# Patient Record
Sex: Female | Born: 1985 | Race: White | Hispanic: No | Marital: Married | Smoking: Never smoker
Health system: Southern US, Community
[De-identification: ages and names within clinical notes are randomized; demographics above are authoritative.]

## PROBLEM LIST (undated history)

## (undated) DIAGNOSIS — N189 Chronic kidney disease, unspecified: Secondary | ICD-10-CM

## (undated) DIAGNOSIS — Z87442 Personal history of urinary calculi: Secondary | ICD-10-CM

## (undated) DIAGNOSIS — K219 Gastro-esophageal reflux disease without esophagitis: Secondary | ICD-10-CM

## (undated) DIAGNOSIS — T7840XA Allergy, unspecified, initial encounter: Secondary | ICD-10-CM

## (undated) DIAGNOSIS — F329 Major depressive disorder, single episode, unspecified: Secondary | ICD-10-CM

## (undated) DIAGNOSIS — E162 Hypoglycemia, unspecified: Secondary | ICD-10-CM

## (undated) DIAGNOSIS — K760 Fatty (change of) liver, not elsewhere classified: Secondary | ICD-10-CM

## (undated) DIAGNOSIS — R12 Heartburn: Secondary | ICD-10-CM

## (undated) DIAGNOSIS — K5900 Constipation, unspecified: Secondary | ICD-10-CM

## (undated) DIAGNOSIS — M7989 Other specified soft tissue disorders: Secondary | ICD-10-CM

## (undated) DIAGNOSIS — E559 Vitamin D deficiency, unspecified: Secondary | ICD-10-CM

## (undated) DIAGNOSIS — M797 Fibromyalgia: Secondary | ICD-10-CM

## (undated) DIAGNOSIS — D649 Anemia, unspecified: Secondary | ICD-10-CM

## (undated) DIAGNOSIS — N200 Calculus of kidney: Secondary | ICD-10-CM

## (undated) DIAGNOSIS — Z9889 Other specified postprocedural states: Secondary | ICD-10-CM

## (undated) DIAGNOSIS — R002 Palpitations: Secondary | ICD-10-CM

## (undated) DIAGNOSIS — R0602 Shortness of breath: Secondary | ICD-10-CM

## (undated) DIAGNOSIS — I10 Essential (primary) hypertension: Secondary | ICD-10-CM

## (undated) DIAGNOSIS — F410 Panic disorder [episodic paroxysmal anxiety] without agoraphobia: Secondary | ICD-10-CM

## (undated) DIAGNOSIS — E538 Deficiency of other specified B group vitamins: Secondary | ICD-10-CM

## (undated) DIAGNOSIS — J189 Pneumonia, unspecified organism: Secondary | ICD-10-CM

## (undated) DIAGNOSIS — E7800 Pure hypercholesterolemia, unspecified: Secondary | ICD-10-CM

## (undated) DIAGNOSIS — E739 Lactose intolerance, unspecified: Secondary | ICD-10-CM

## (undated) DIAGNOSIS — K829 Disease of gallbladder, unspecified: Secondary | ICD-10-CM

## (undated) DIAGNOSIS — N979 Female infertility, unspecified: Secondary | ICD-10-CM

## (undated) DIAGNOSIS — G43909 Migraine, unspecified, not intractable, without status migrainosus: Secondary | ICD-10-CM

## (undated) DIAGNOSIS — E282 Polycystic ovarian syndrome: Secondary | ICD-10-CM

## (undated) DIAGNOSIS — R112 Nausea with vomiting, unspecified: Secondary | ICD-10-CM

## (undated) DIAGNOSIS — O139 Gestational [pregnancy-induced] hypertension without significant proteinuria, unspecified trimester: Secondary | ICD-10-CM

## (undated) DIAGNOSIS — F419 Anxiety disorder, unspecified: Secondary | ICD-10-CM

## (undated) DIAGNOSIS — F988 Other specified behavioral and emotional disorders with onset usually occurring in childhood and adolescence: Secondary | ICD-10-CM

## (undated) DIAGNOSIS — Z8711 Personal history of peptic ulcer disease: Secondary | ICD-10-CM

## (undated) DIAGNOSIS — E039 Hypothyroidism, unspecified: Secondary | ICD-10-CM

## (undated) DIAGNOSIS — F3289 Other specified depressive episodes: Secondary | ICD-10-CM

## (undated) DIAGNOSIS — M1990 Unspecified osteoarthritis, unspecified site: Secondary | ICD-10-CM

## (undated) DIAGNOSIS — M549 Dorsalgia, unspecified: Secondary | ICD-10-CM

## (undated) HISTORY — DX: Migraine, unspecified, not intractable, without status migrainosus: G43.909

## (undated) HISTORY — DX: Anxiety disorder, unspecified: F41.9

## (undated) HISTORY — PX: SMALL INTESTINE SURGERY: SHX150

## (undated) HISTORY — PX: LITHOTRIPSY: SUR834

## (undated) HISTORY — DX: Personal history of peptic ulcer disease: Z87.11

## (undated) HISTORY — DX: Deficiency of other specified B group vitamins: E53.8

## (undated) HISTORY — DX: Essential (primary) hypertension: I10

## (undated) HISTORY — DX: Panic disorder (episodic paroxysmal anxiety): F41.0

## (undated) HISTORY — DX: Vitamin D deficiency, unspecified: E55.9

## (undated) HISTORY — PX: TUBAL LIGATION: SHX77

## (undated) HISTORY — DX: Dorsalgia, unspecified: M54.9

## (undated) HISTORY — DX: Fibromyalgia: M79.7

## (undated) HISTORY — PX: FOOT SURGERY: SHX648

## (undated) HISTORY — DX: Polycystic ovarian syndrome: E28.2

## (undated) HISTORY — DX: Shortness of breath: R06.02

## (undated) HISTORY — PX: TONSILLECTOMY: SUR1361

## (undated) HISTORY — DX: Pure hypercholesterolemia, unspecified: E78.00

## (undated) HISTORY — DX: Chronic kidney disease, unspecified: N18.9

## (undated) HISTORY — DX: Major depressive disorder, single episode, unspecified: F32.9

## (undated) HISTORY — DX: Disease of gallbladder, unspecified: K82.9

## (undated) HISTORY — DX: Constipation, unspecified: K59.00

## (undated) HISTORY — DX: Hypothyroidism, unspecified: E03.9

## (undated) HISTORY — DX: Other specified behavioral and emotional disorders with onset usually occurring in childhood and adolescence: F98.8

## (undated) HISTORY — DX: Lactose intolerance, unspecified: E73.9

## (undated) HISTORY — DX: Fatty (change of) liver, not elsewhere classified: K76.0

## (undated) HISTORY — PX: WISDOM TOOTH EXTRACTION: SHX21

## (undated) HISTORY — DX: Palpitations: R00.2

## (undated) HISTORY — DX: Other specified depressive episodes: F32.89

## (undated) HISTORY — DX: Morbid (severe) obesity due to excess calories: E66.01

## (undated) HISTORY — DX: Allergy, unspecified, initial encounter: T78.40XA

## (undated) HISTORY — DX: Heartburn: R12

## (undated) HISTORY — DX: Other specified soft tissue disorders: M79.89

---

## 2006-07-25 ENCOUNTER — Emergency Department (HOSPITAL_COMMUNITY): Admission: EM | Admit: 2006-07-25 | Discharge: 2006-07-25 | Payer: Self-pay | Admitting: Family Medicine

## 2006-09-10 ENCOUNTER — Emergency Department (HOSPITAL_COMMUNITY): Admission: EM | Admit: 2006-09-10 | Discharge: 2006-09-10 | Payer: Self-pay | Admitting: Emergency Medicine

## 2006-09-10 IMAGING — CT CT ABDOMEN W/O CM
2 of 4 series · 13 of 32 positions shown, 18 images · IV contrast (agent unspecified)
Comparison: none

CLINICAL DATA: Right-sided flank and back pain.  Hematuria.  Vomiting.  
 ABDOMEN CT WITHOUT CONTRAST:
TECHNIQUE: Multidetector CT imaging of the abdomen was performed following the standard protocol without IV contrast.
TECHNIQUE: Multidetector CT imaging of the pelvis was performed following the standard protocol without IV contrast.

[Series 2: routine abdomen · axial · 0.91mm/px · z∈[-438,-138]mm · 5 of 91 slices shown, 10 images]
[im 16/91  soft-tissue]
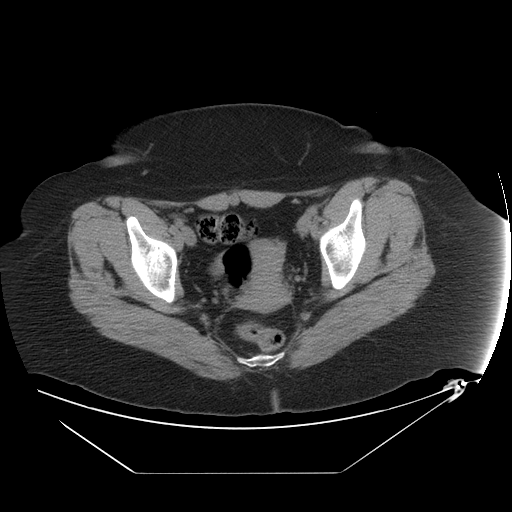
[im 16/91  bone]
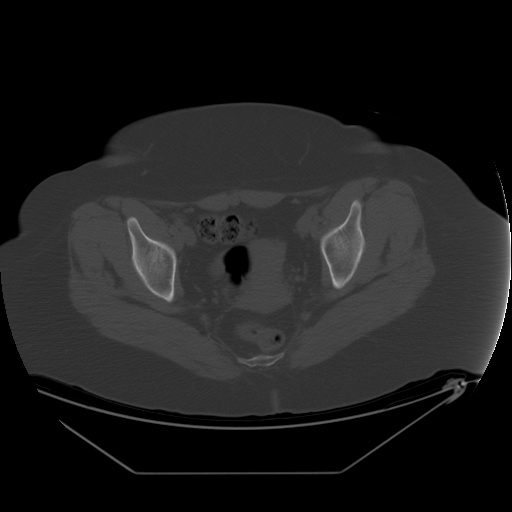
[im 31/91  soft-tissue]
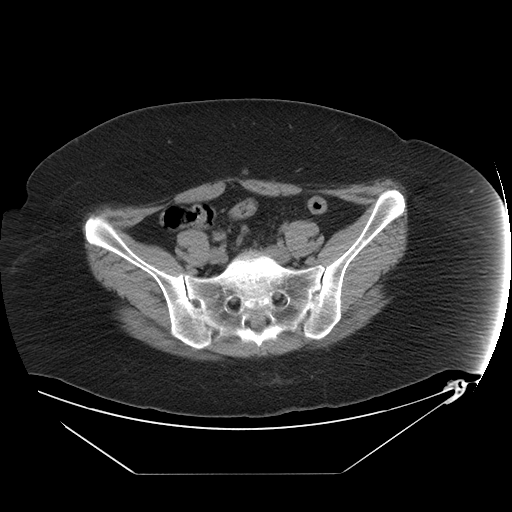
[im 31/91  lung]
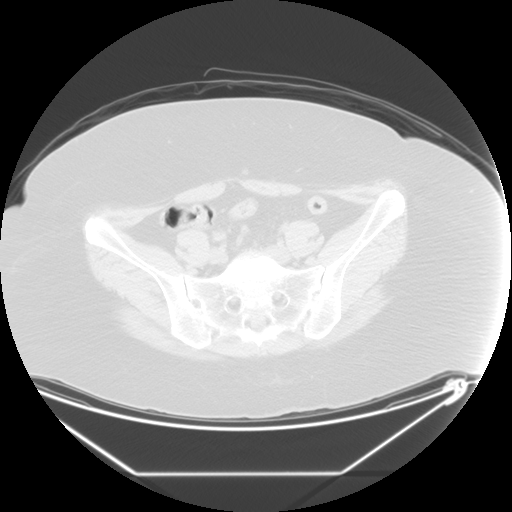
[im 46/91  soft-tissue]
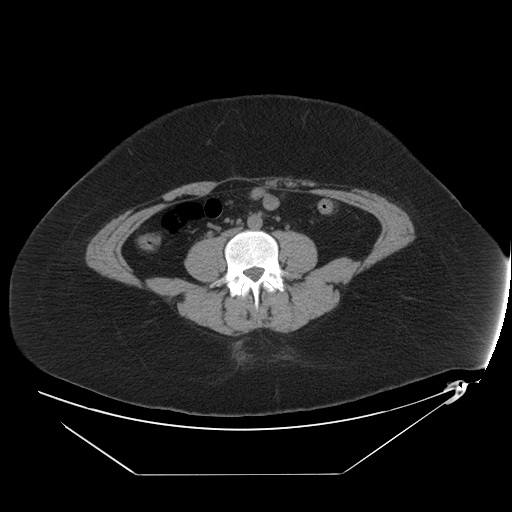
[im 46/91  lung]
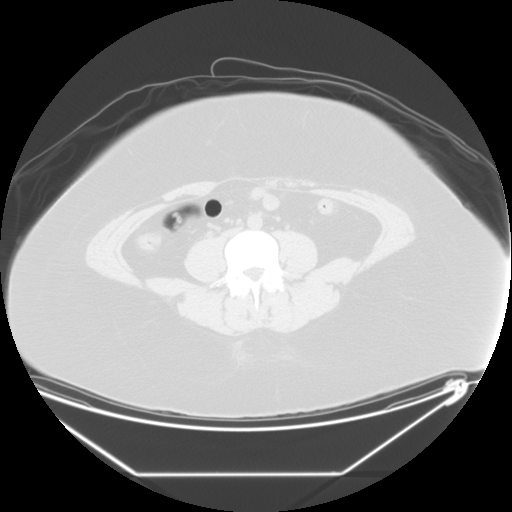
[im 61/91  soft-tissue]
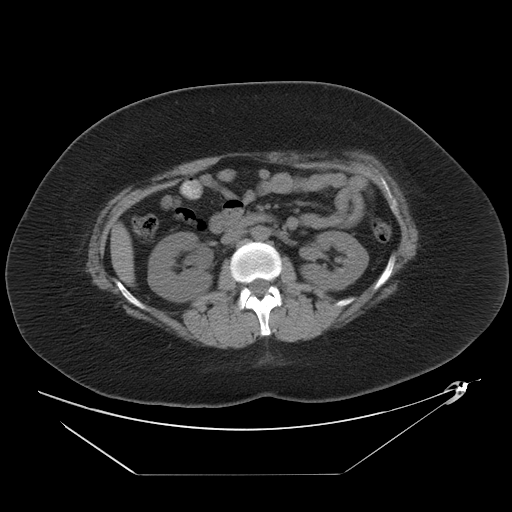
[im 61/91  lung]
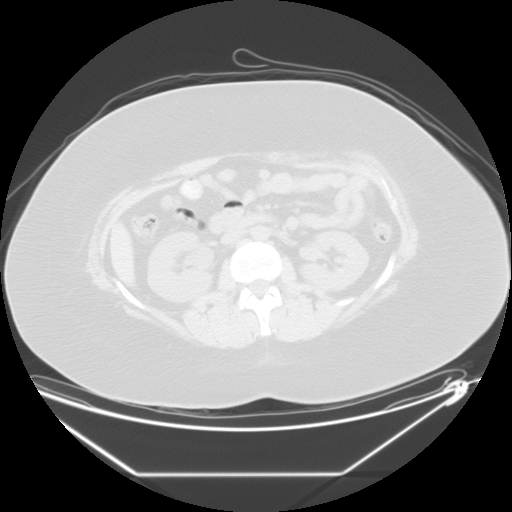
[im 76/91  soft-tissue]
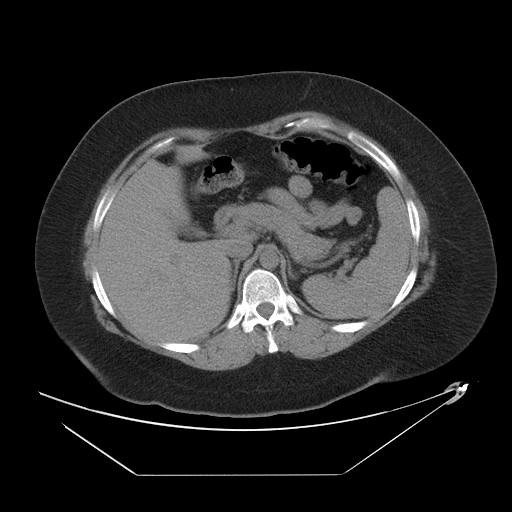
[im 76/91  lung]
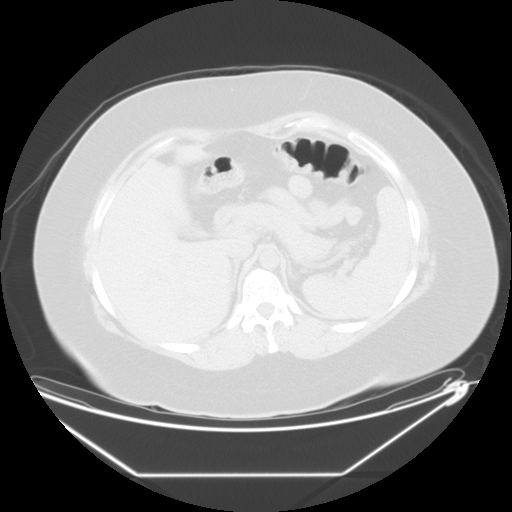

[Series 401: reformatted · sagittal · 0.98mm/px · 8 of 160 slices shown]
[im 13/160  soft-tissue]
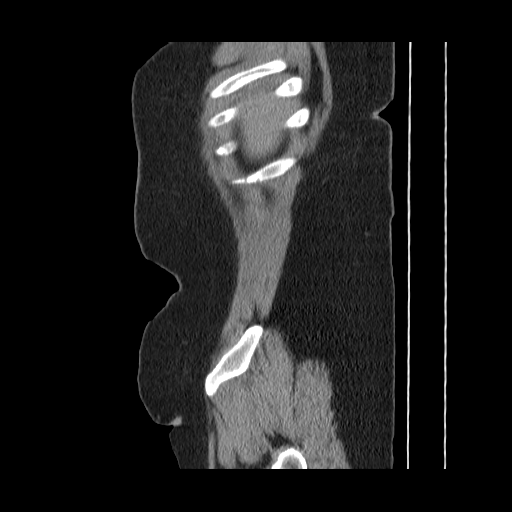
[im 37/160  soft-tissue]
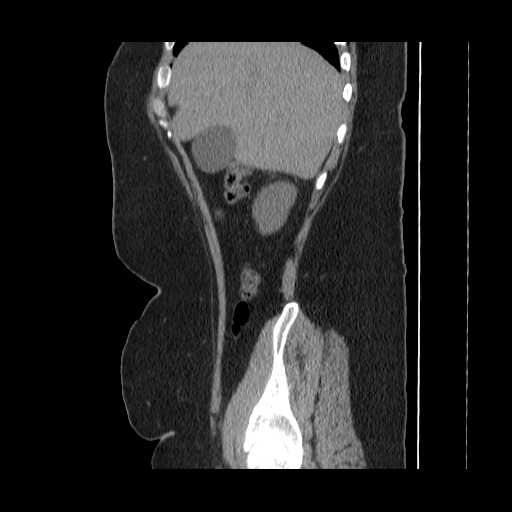
[im 49/160  soft-tissue]
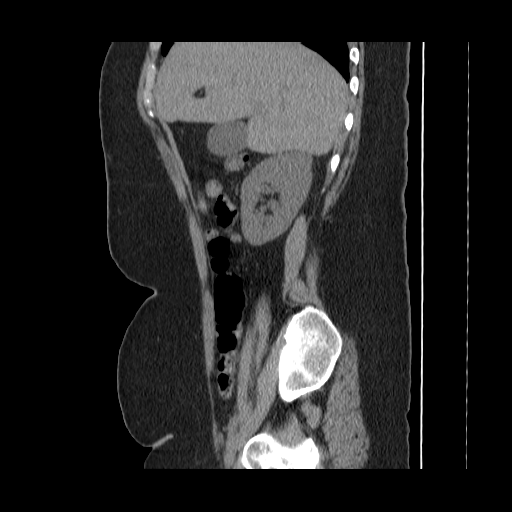
[im 74/160  soft-tissue]
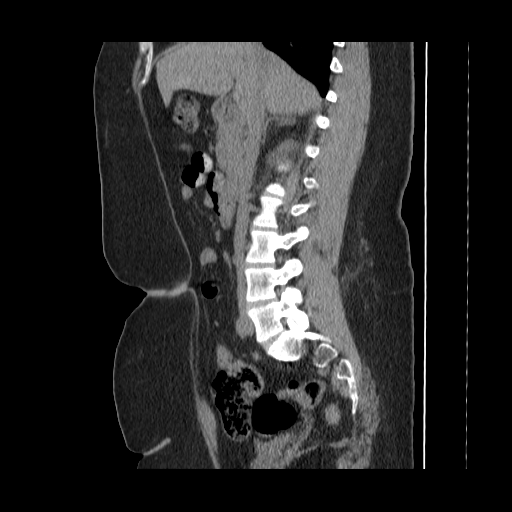
[im 86/160  soft-tissue]
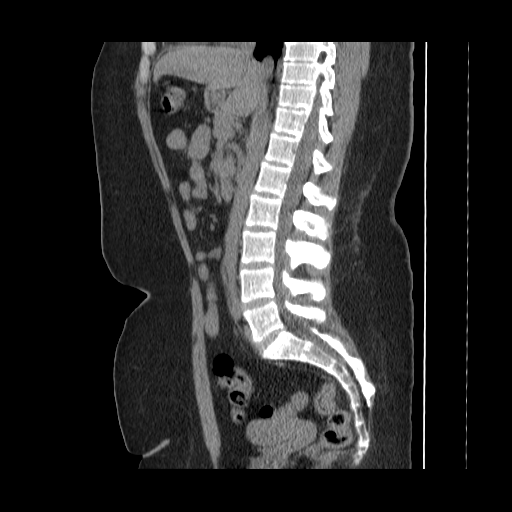
[im 111/160  soft-tissue]
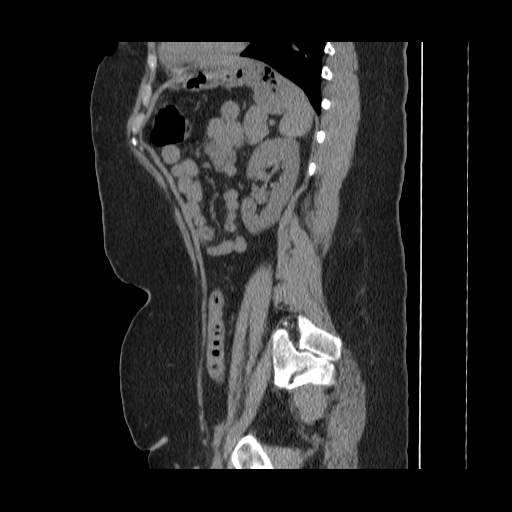
[im 123/160  soft-tissue]
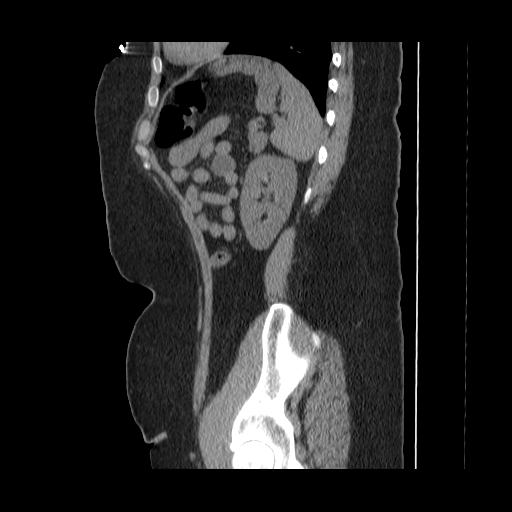
[im 147/160  soft-tissue]
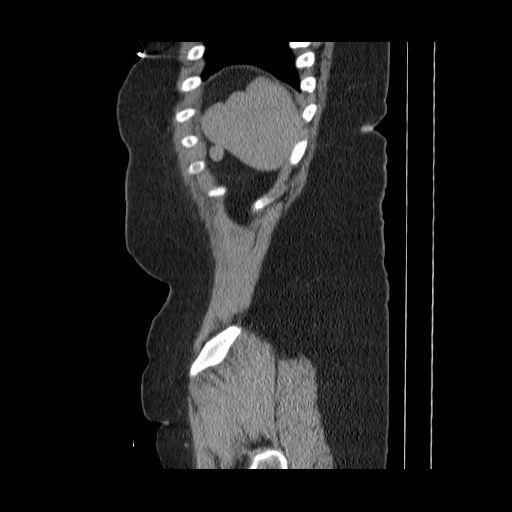

[13 of 32 positions shown; findings below may reference images not displayed]

FINDINGS: Mild right hydronephrosis is seen.  No intrarenal calculi are identified.  Noncontrast images of the other abdominal parenchymal organs are unremarkable.  There is no evidence of mass or inflammatory process.
IMPRESSION: Mild right hydronephrosis.  See pelvic CT report below.  
 PELVIS CT WITHOUT CONTRAST:
FINDINGS: Mild right ureteral dilatation is seen.  A tiny 2 mm calculus is seen in the distal right ureter just proximal to the ureterovesical junction.  
 There is no evidence of pelvic mass or inflammatory process.  No dilated bowel loops are seen and there is no evidence of abnormal fluid collections.
IMPRESSION: There is a 2 mm calculus in the distal right ureter.  This results in mild right hydronephrosis.

## 2007-01-18 ENCOUNTER — Emergency Department (HOSPITAL_COMMUNITY): Admission: EM | Admit: 2007-01-18 | Discharge: 2007-01-18 | Payer: Self-pay | Admitting: Emergency Medicine

## 2007-05-02 ENCOUNTER — Emergency Department (HOSPITAL_COMMUNITY): Admission: EM | Admit: 2007-05-02 | Discharge: 2007-05-03 | Payer: Self-pay | Admitting: Emergency Medicine
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
## 2007-05-10 ENCOUNTER — Emergency Department (HOSPITAL_COMMUNITY): Admission: EM | Admit: 2007-05-10 | Discharge: 2007-05-10 | Payer: Self-pay | Admitting: Emergency Medicine

## 2007-05-10 IMAGING — CR DG WRIST COMPLETE 3+V*R*
4 series · 4 of 4 positions shown · non-contrast
Comparison: none

CLINICAL DATA: Fell today with wrist pain.  
 RIGHT WRIST ? 4 VIEW:

[x wrist pa right]
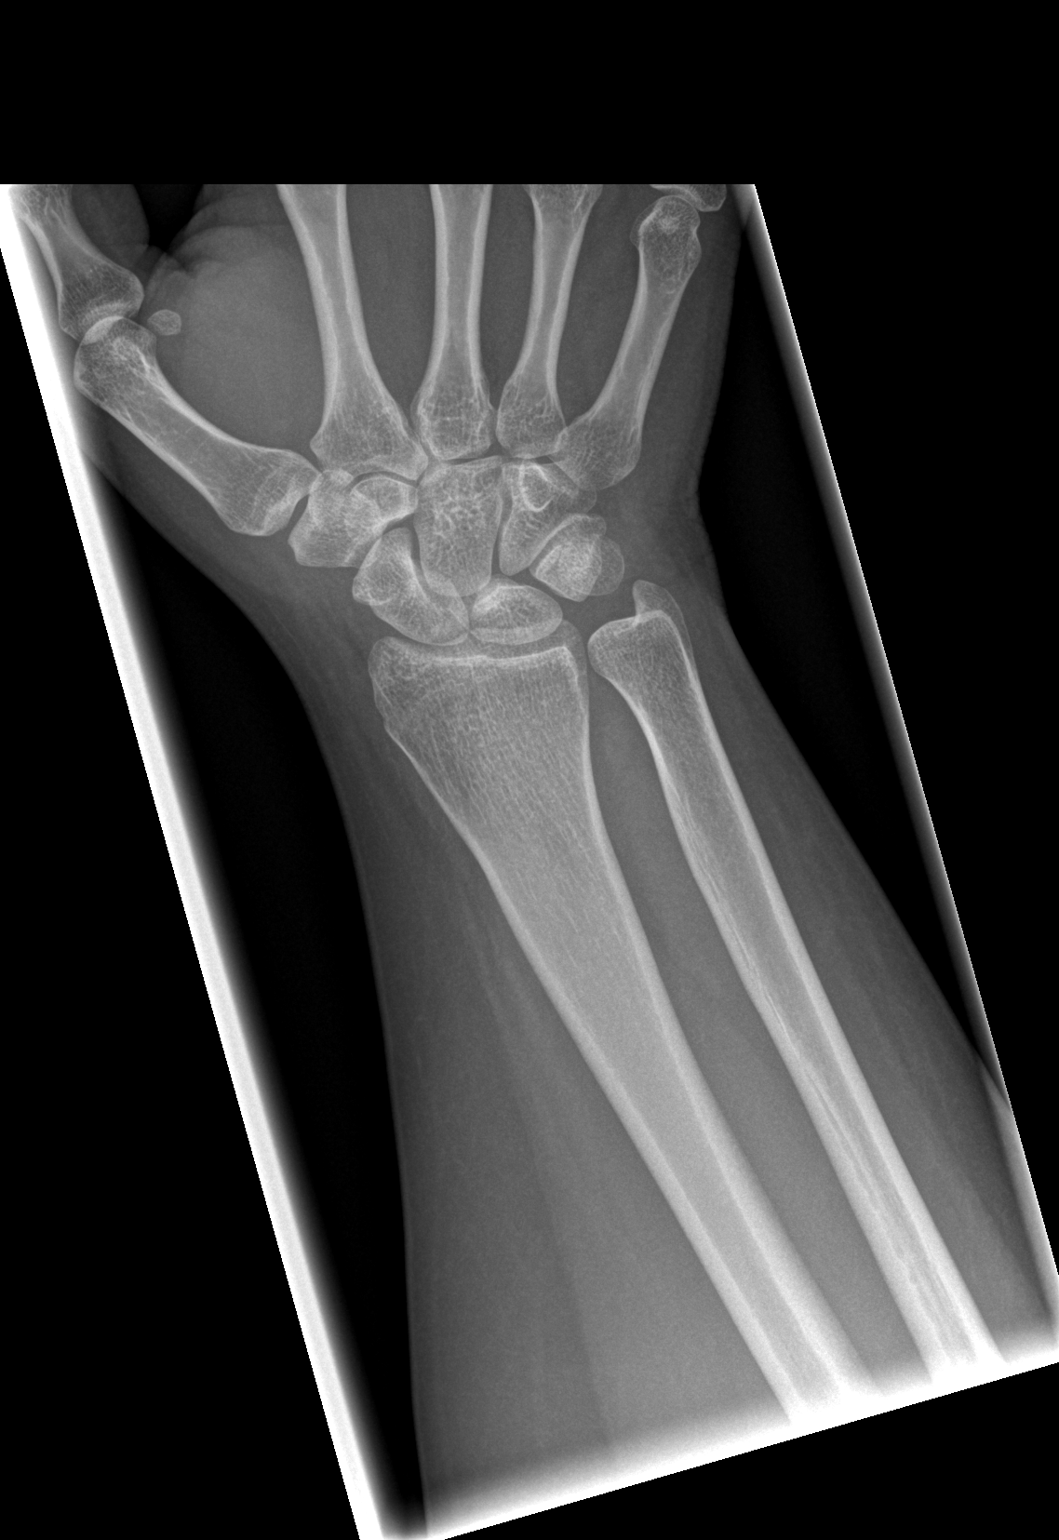

[x wrist obl right]
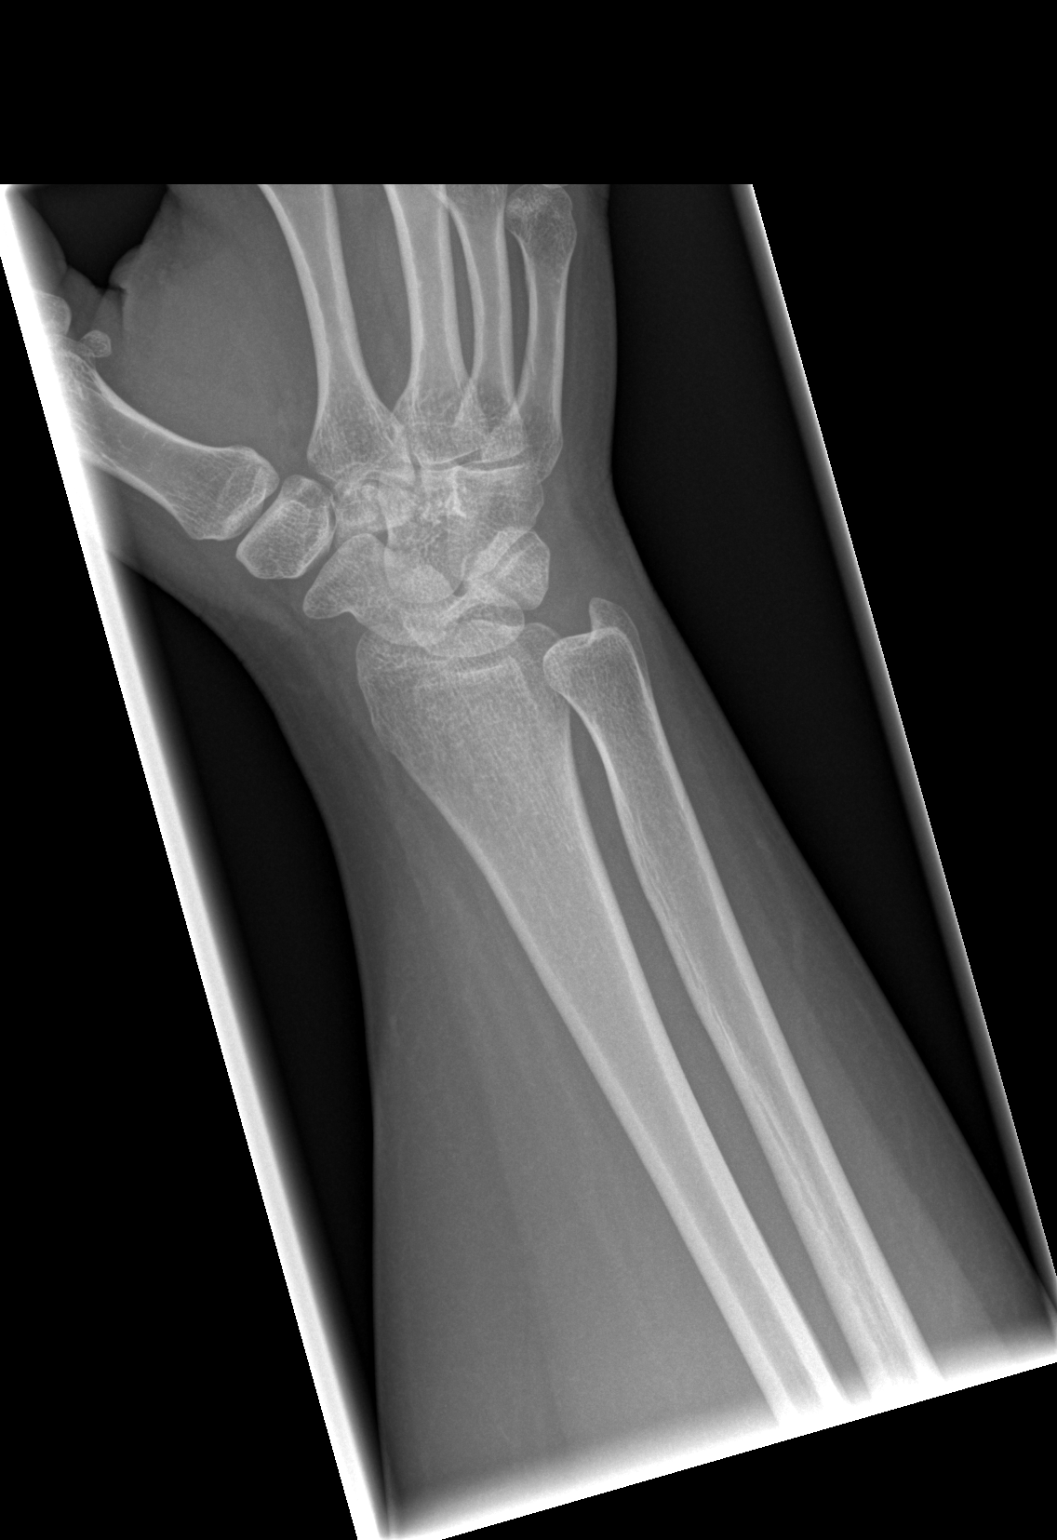

[x wrist lat right]
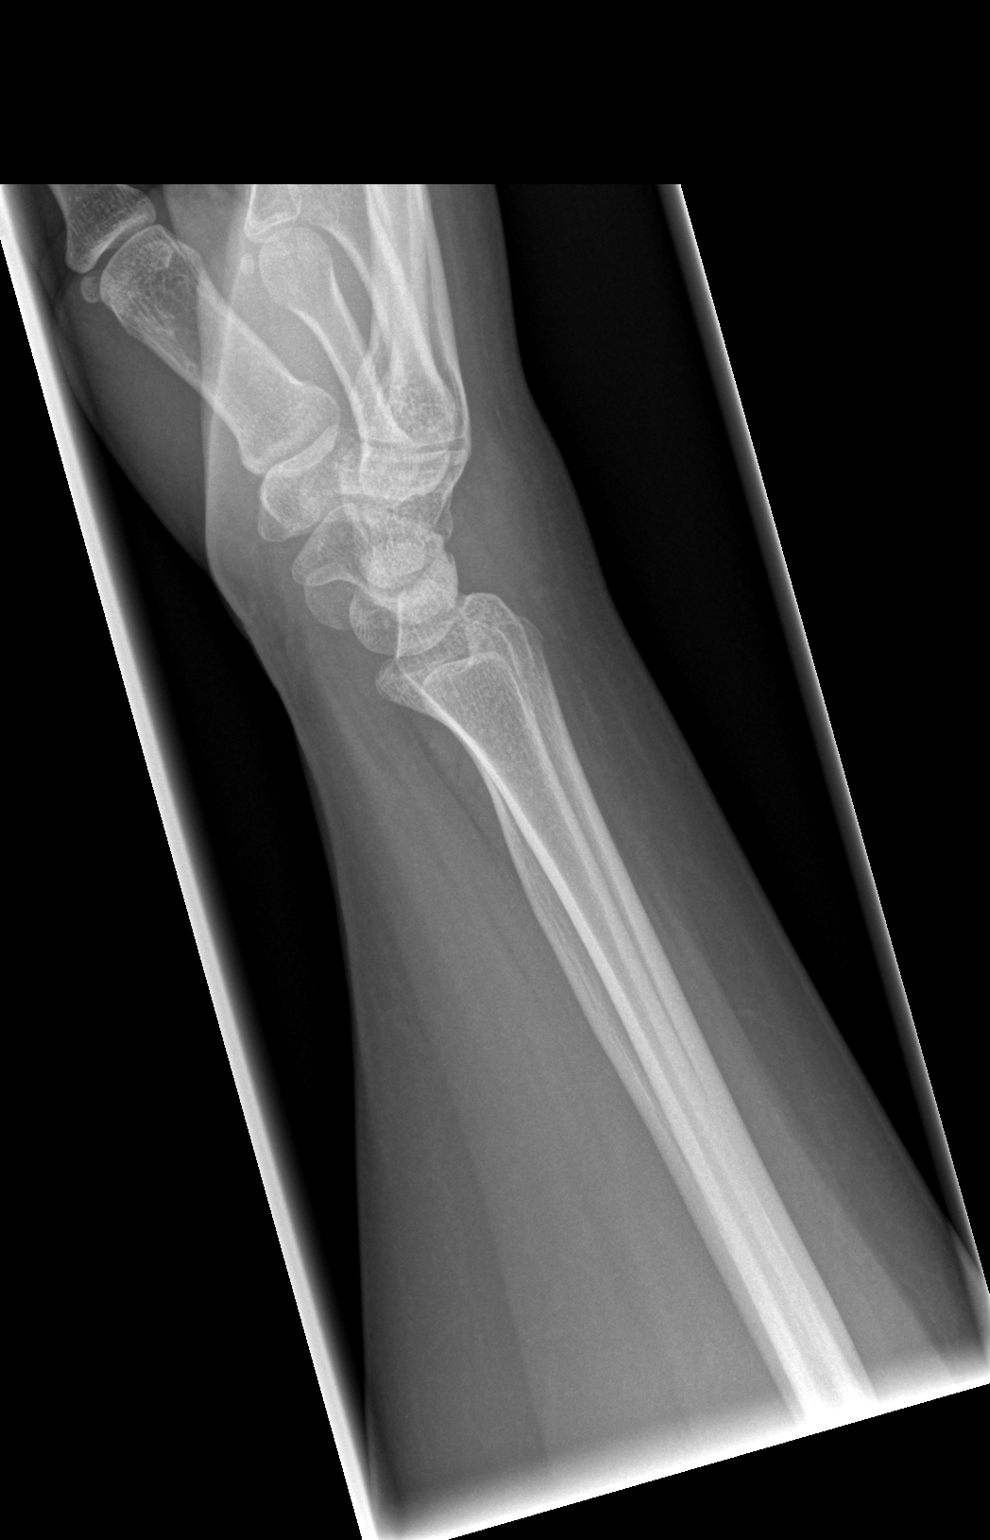

[x navicular]
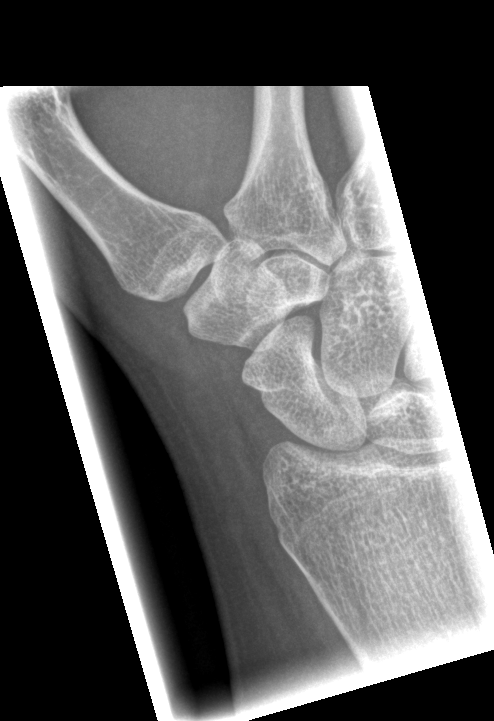

[4 of 4 positions shown; findings below may reference images not displayed]

FINDINGS: Four views of the right wrist were obtained.  No definite fracture is seen. The radiocarpal joint space appears normal and the carpal bones are in normal position.  Only on the lateral view is there a very slight irregularity to the cortex of the distal radius on the dorsal aspect and follow-up films may be helpful if pain persist.
IMPRESSION: No definite acute fracture.  Slight cortical irregularity of the distal radius on the dorsal aspect.  Correlate clinically.

## 2007-08-19 ENCOUNTER — Emergency Department (HOSPITAL_COMMUNITY): Admission: EM | Admit: 2007-08-19 | Discharge: 2007-08-19 | Payer: Self-pay | Admitting: Family Medicine

## 2007-09-09 ENCOUNTER — Emergency Department (HOSPITAL_COMMUNITY): Admission: EM | Admit: 2007-09-09 | Discharge: 2007-09-09 | Payer: Self-pay | Admitting: Emergency Medicine

## 2007-12-27 ENCOUNTER — Ambulatory Visit (HOSPITAL_COMMUNITY): Admission: RE | Admit: 2007-12-27 | Discharge: 2007-12-27 | Payer: Self-pay | Admitting: Endocrinology

## 2007-12-27 IMAGING — US US SOFT TISSUE HEAD/NECK
1 series · 14 of 25 positions shown · non-contrast
Comparison: None

CLINICAL DATA: Enlarged thyroid gland.  Hypothyroidism.

THYROID ULTRASOUND
TECHNIQUE: Ultrasound examination of the thyroid gland and all
adjacent soft tissues was performed.

[Series 1: unknown · 0.09mm/px · 14 of 41 slices shown]
[im 1/41]
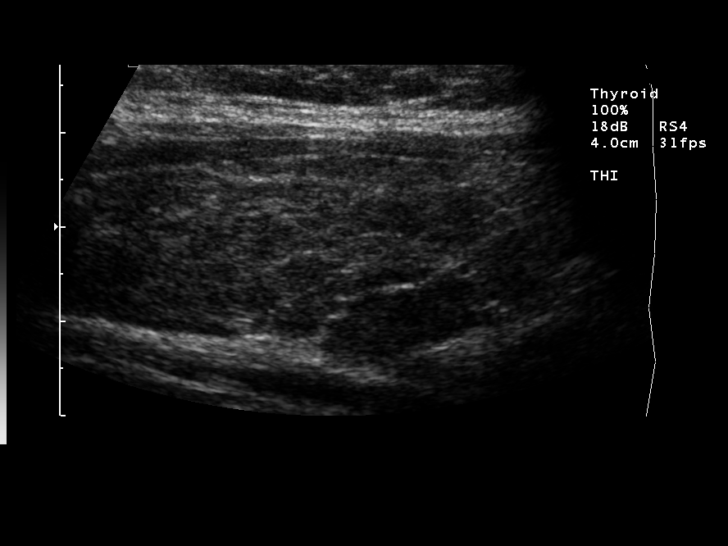
[im 4/41]
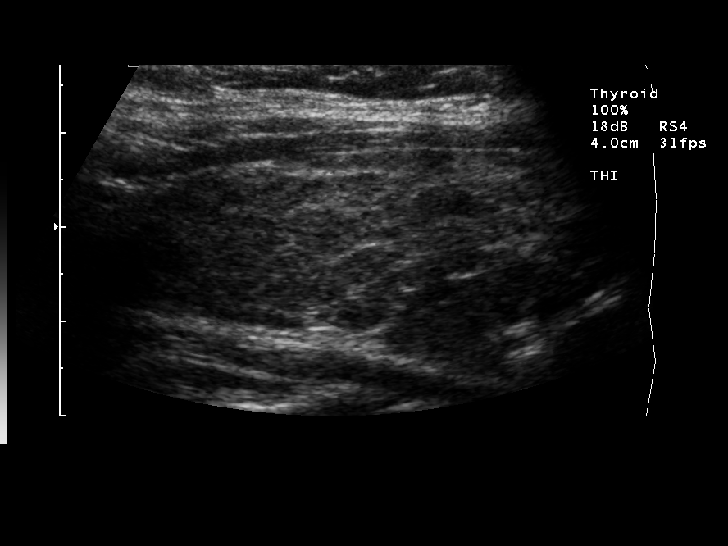
[im 7/41]
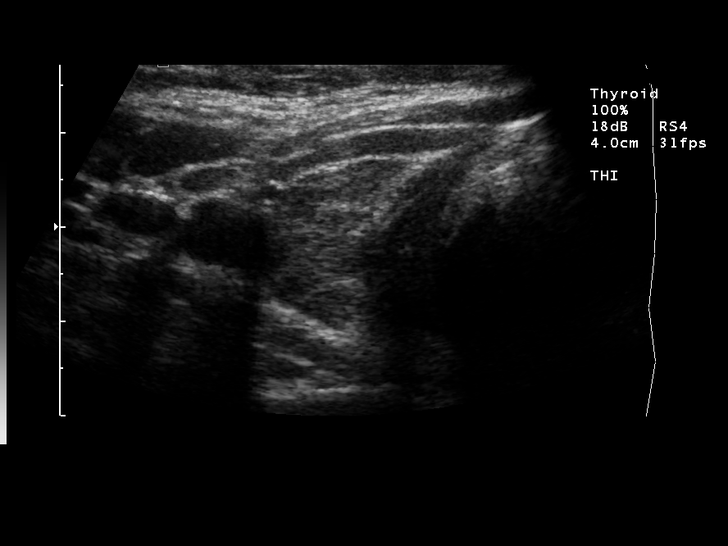
[im 11/41]
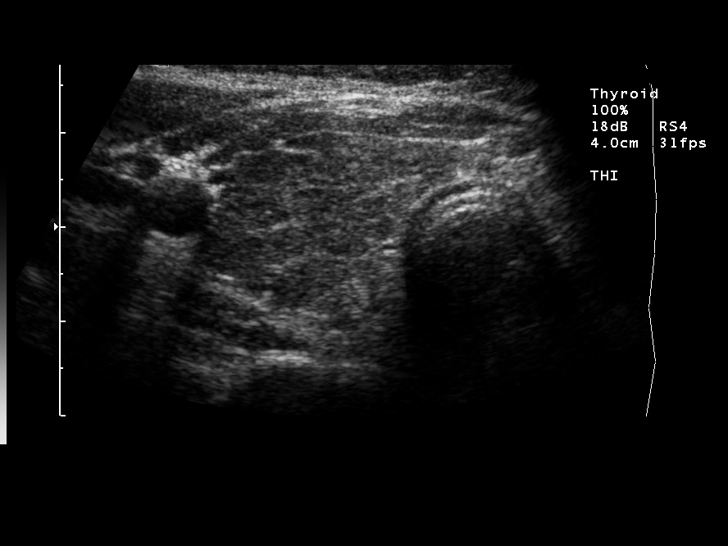
[im 14/41]
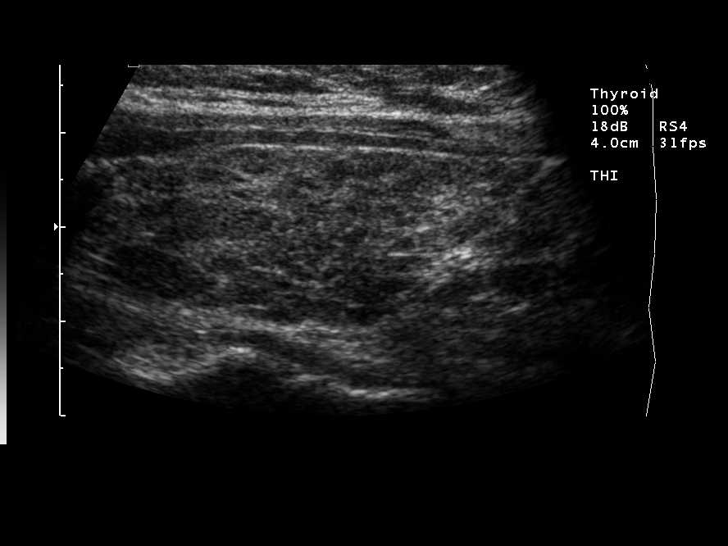
[im 16/41]
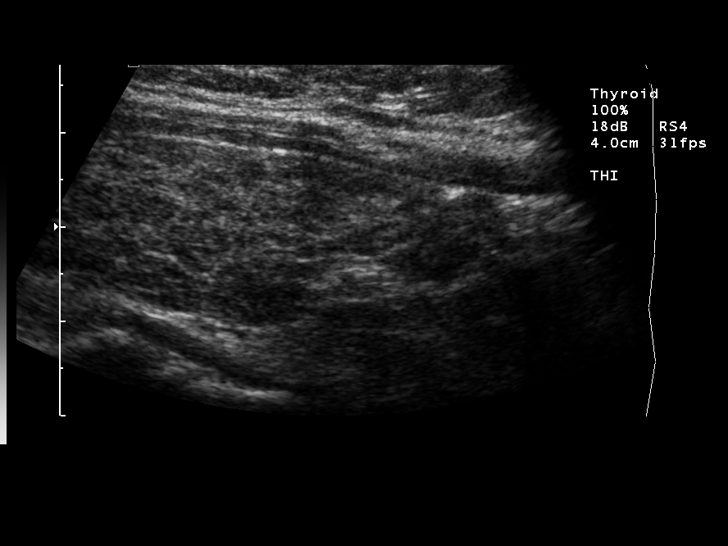
[im 19/41]
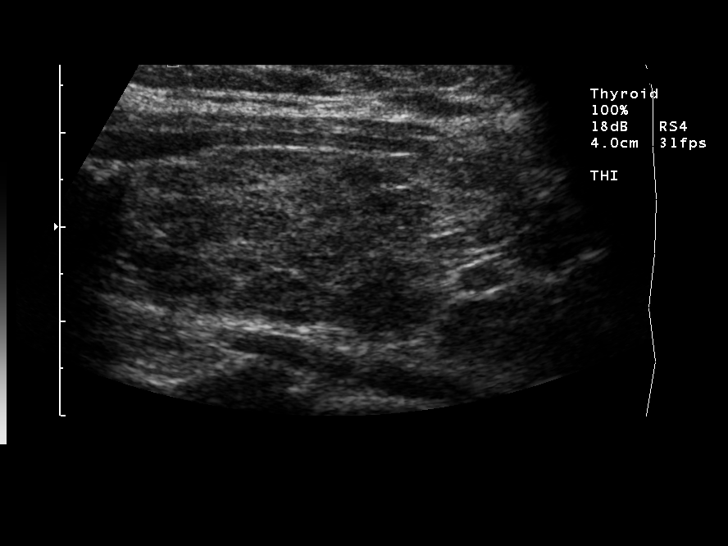
[im 22/41]
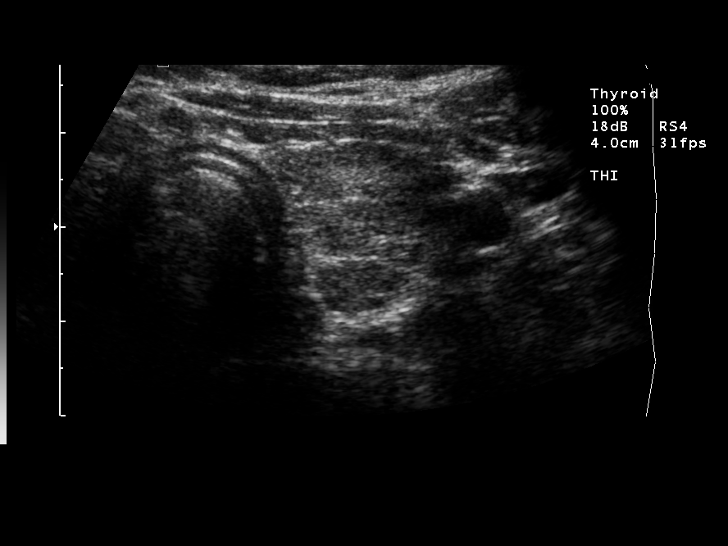
[im 26/41]
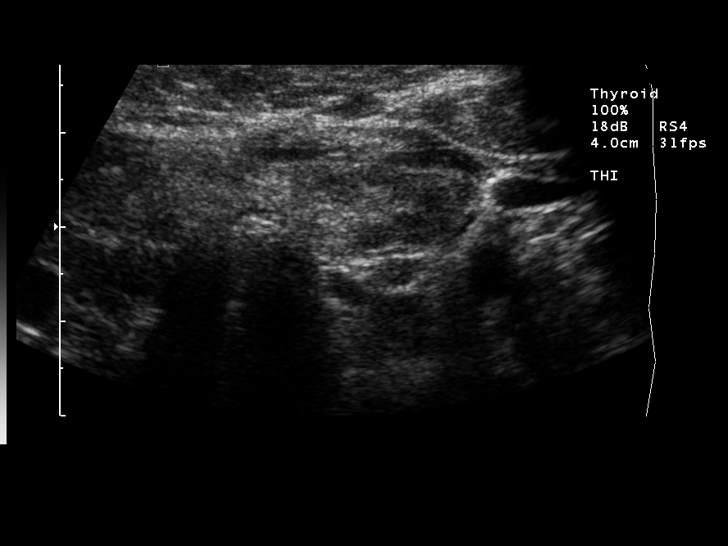
[im 27/41]
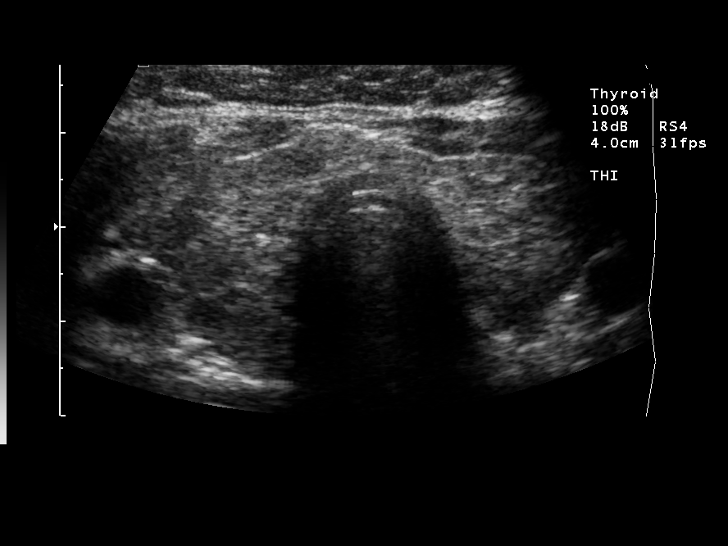
[im 31/41]
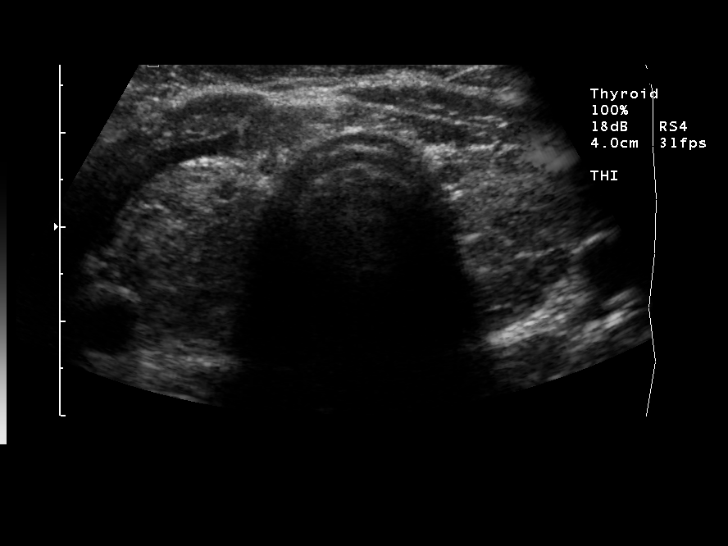
[im 34/41]
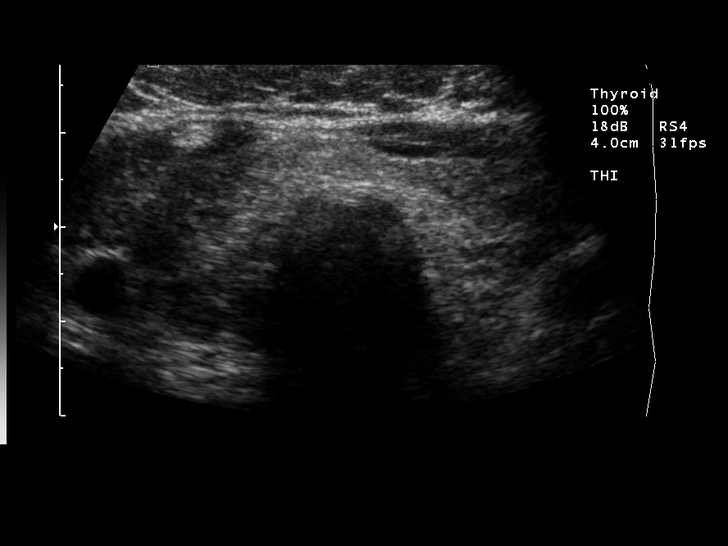
[im 37/41]
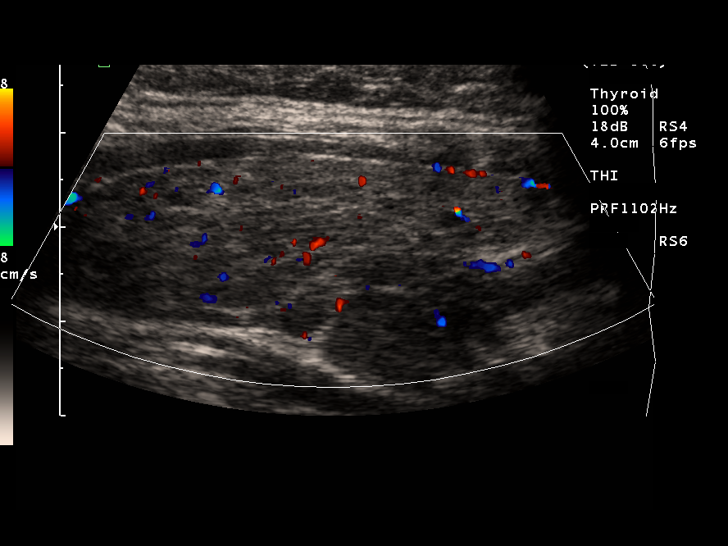
[im 41/41]
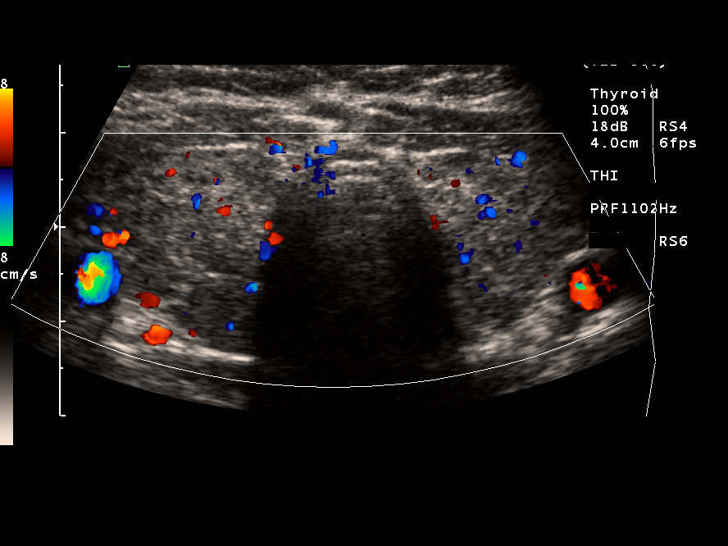

[14 of 25 positions shown; findings below may reference images not displayed]

FINDINGS: The right thyroid lobe measures 5.6 cm in length and
x 2.0 cm in transverse dimensions.  The left thyroid lobe measures
5.6 cm in length and 2.0 x 2.1 cm in transverse dimensions.
Thyroid isthmus measures 6 mm in thickness.  There is a diffuse
inhomogeneity of thyroid echotexture.  No focal solid or cystic
nodules are appreciated.
IMPRESSION: Sonographically, the thyroid gland appears somewhat prominent in
size and there is diffuse inhomogeneity of parenchymal echotexture.
No focal nodular lesion.

## 2008-03-20 ENCOUNTER — Emergency Department (HOSPITAL_COMMUNITY): Admission: EM | Admit: 2008-03-20 | Discharge: 2008-03-20 | Payer: Self-pay | Admitting: Emergency Medicine

## 2008-03-20 IMAGING — CT CT ABDOMEN W/O CM
2 of 4 series · 14 of 32 positions shown, 19 images · non-contrast
Comparison: [DATE]

CT ABDOMEN

CLINICAL DATA: Left flank pain.  History of kidney stone.

CT ABDOMEN AND PELVIS WITHOUT CONTRAST
TECHNIQUE: Multidetector CT imaging of the abdomen and pelvis was
performed followig the standard protocol without intravenous
contrast.

[Series 2: renal stone · axial · 0.98mm/px · z∈[-498,-138]mm · 7 of 96 slices shown, 12 images]
[im 12/96  soft-tissue]
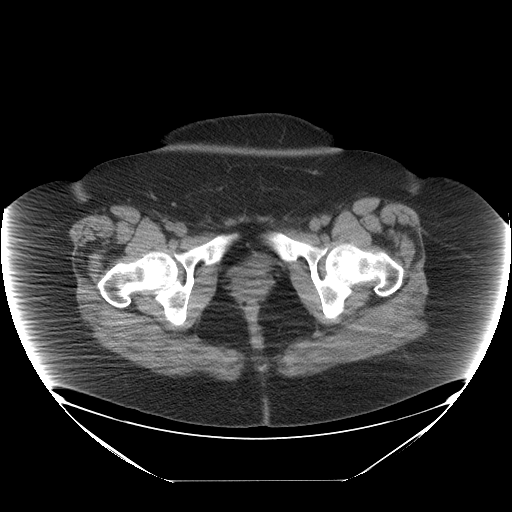
[im 12/96  bone]
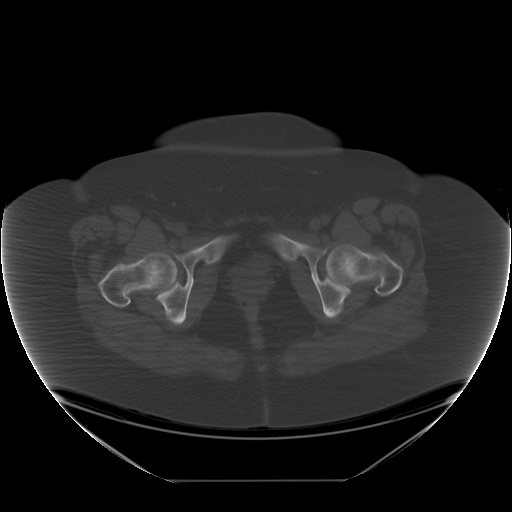
[im 24/96  soft-tissue]
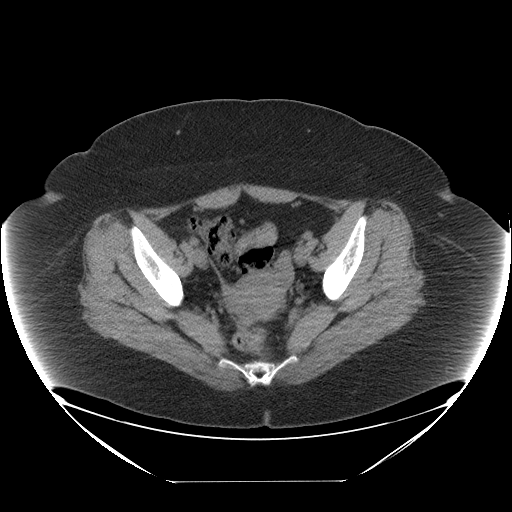
[im 36/96  soft-tissue]
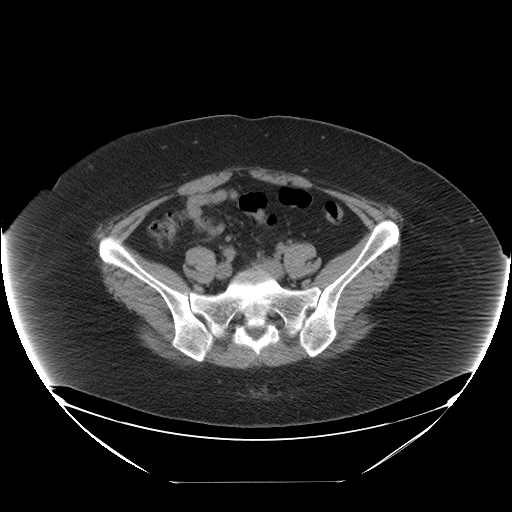
[im 48/96  soft-tissue]
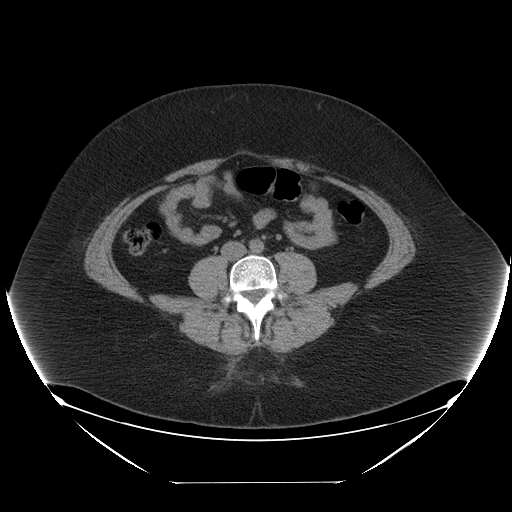
[im 48/96  lung]
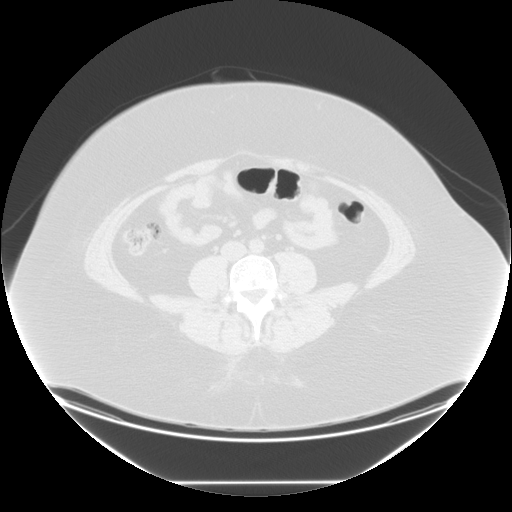
[im 60/96  soft-tissue]
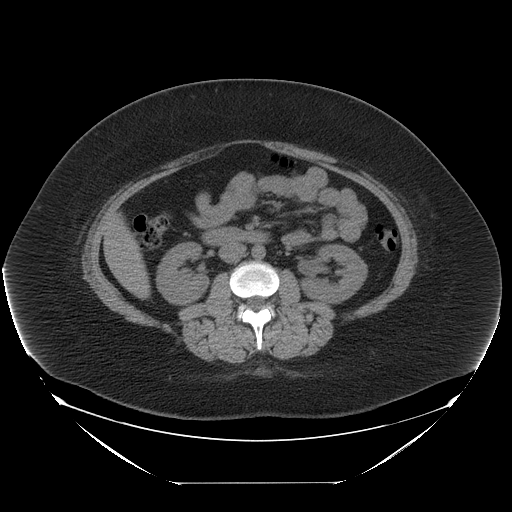
[im 60/96  lung]
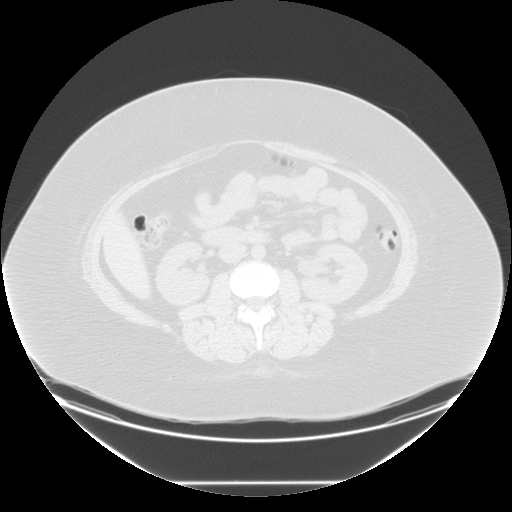
[im 72/96  soft-tissue]
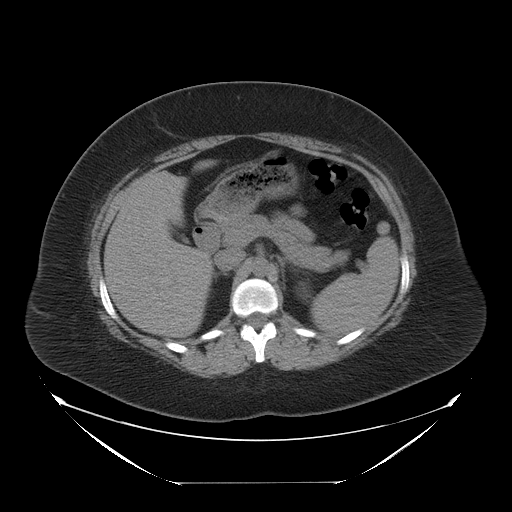
[im 72/96  lung]
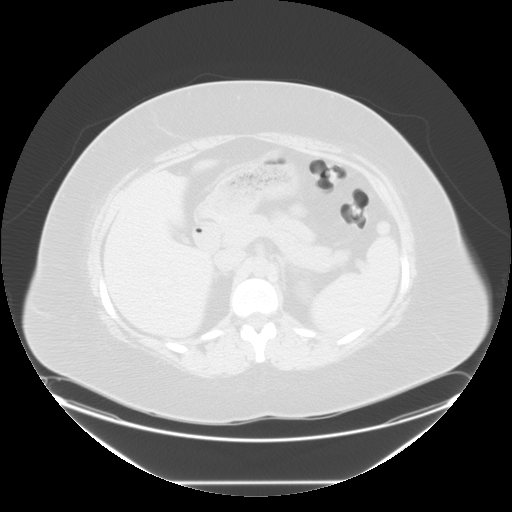
[im 84/96  soft-tissue]
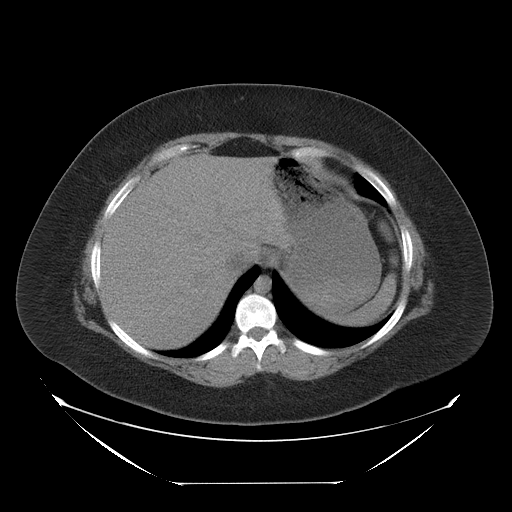
[im 84/96  lung]
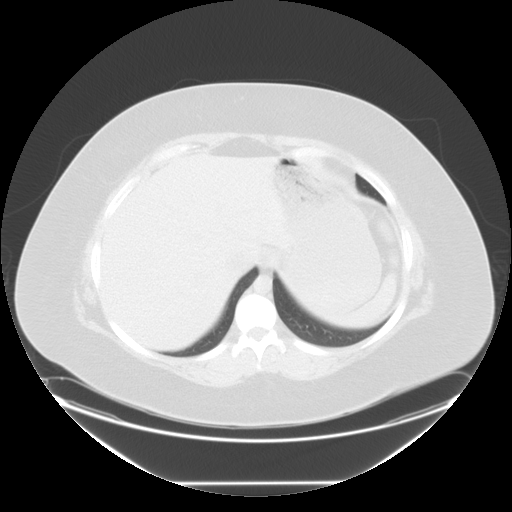

[Series 400: reformatted · sagittal · 0.90mm/px · 7 of 133 slices shown]
[im 13/133  soft-tissue]
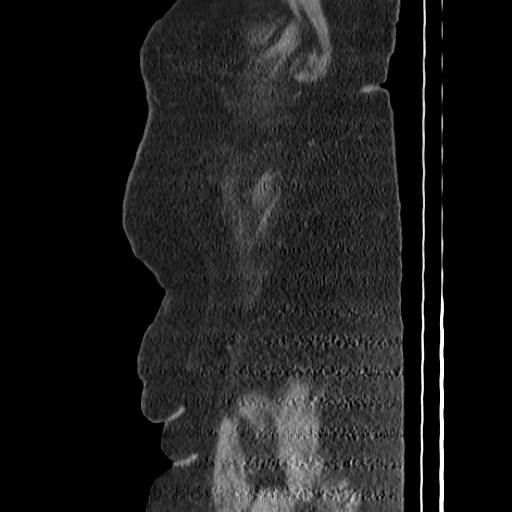
[im 25/133  soft-tissue]
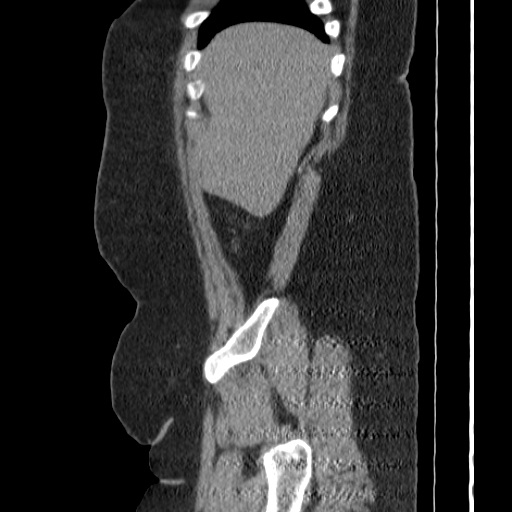
[im 49/133  soft-tissue]
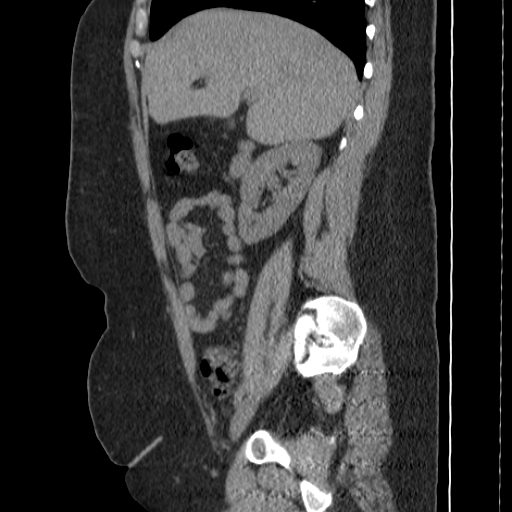
[im 61/133  soft-tissue]
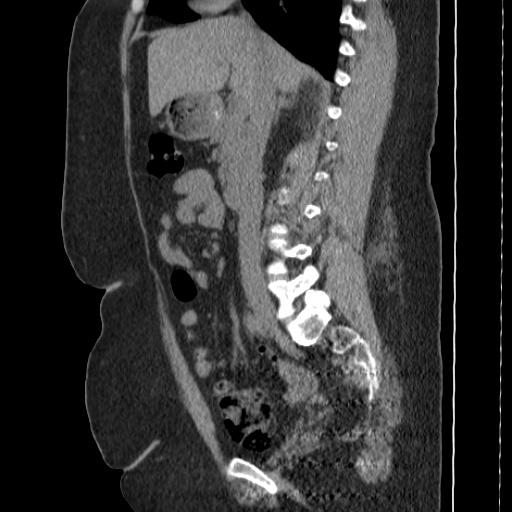
[im 73/133  soft-tissue]
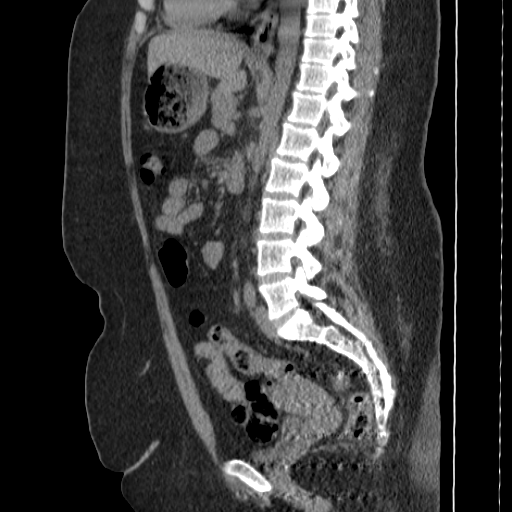
[im 85/133  soft-tissue]
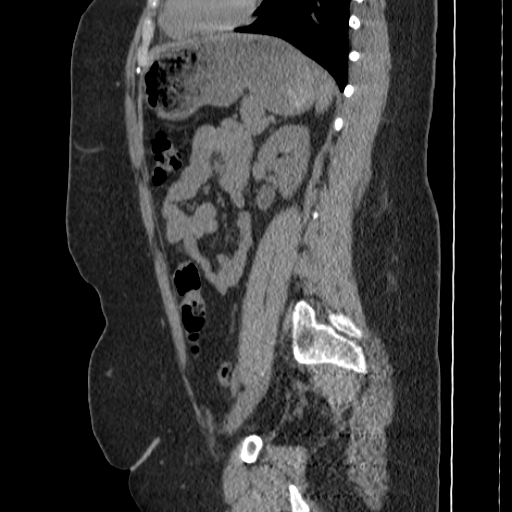
[im 109/133  soft-tissue]
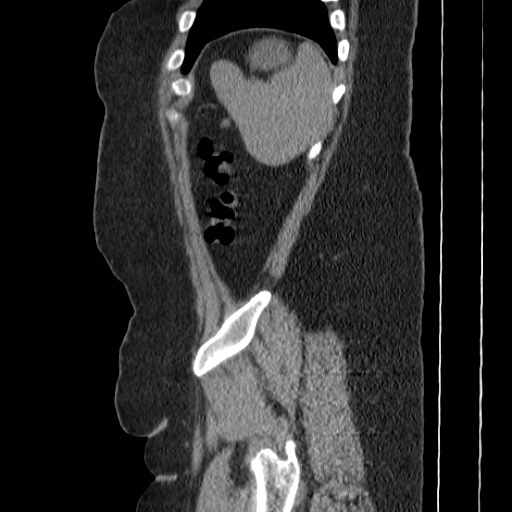

[14 of 32 positions shown; findings below may reference images not displayed]

FINDINGS: There is no evidence of renal calculi or hydronephrosis.
The liver, spleen, pancreas, and adrenal glands are normal.  The
ureters are not dilated.  No bony abnormality.  No free air free
fluid.
IMPRESSION: Benign-appearing abdomen.  Specifically, no evidence of
hydronephrosis.

CT PELVIS
FINDINGS: The uterus and ovaries are normal.  The appendix and
terminal ileum are normal.  No distal ureteral calculi or distal
ureteral dilatation.  The bladder is empty.  No bony abnormality.
IMPRESSION: Benign-appearing pelvis.

## 2008-08-01 ENCOUNTER — Ambulatory Visit (HOSPITAL_COMMUNITY): Admission: RE | Admit: 2008-08-01 | Discharge: 2008-08-01 | Payer: Self-pay | Admitting: Obstetrics and Gynecology

## 2008-08-01 IMAGING — RF DG HYSTEROGRAM
3 series · 3 of 3 positions shown · IV contrast (omnipaque)
Comparison: none

CLINICAL DATA: Infertility

HYSTEROSALPINGOGRAM
TECHNIQUE: Following cleansing of the cervix and vagina with
Betadine solution, a hysterosalpingogram was performed using a 5-
French hysterosalpingogram catheter and Omnipaque 300 contrast. The
patient tolerated the examination without difficulty.

[Series 1: run · 1 of 1 slices shown (1 of 3)]
[im 1/1]
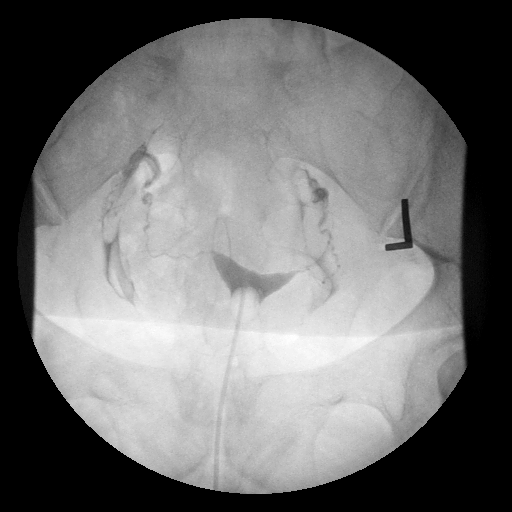

[Series 2: run · 1 of 1 slices shown (2 of 3)]
[im 1/1]
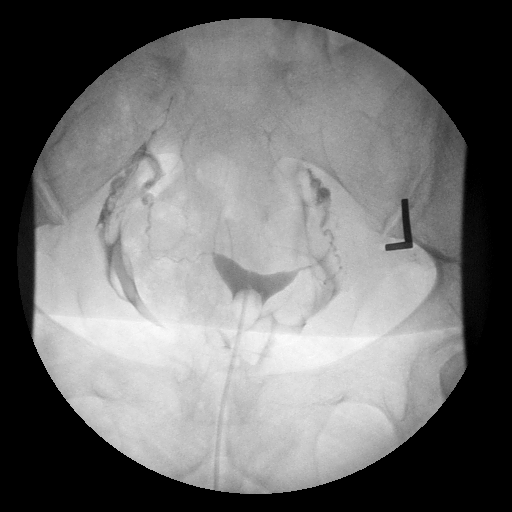

[Series 3: run · 1 of 1 slices shown (3 of 3)]
[im 1/1]
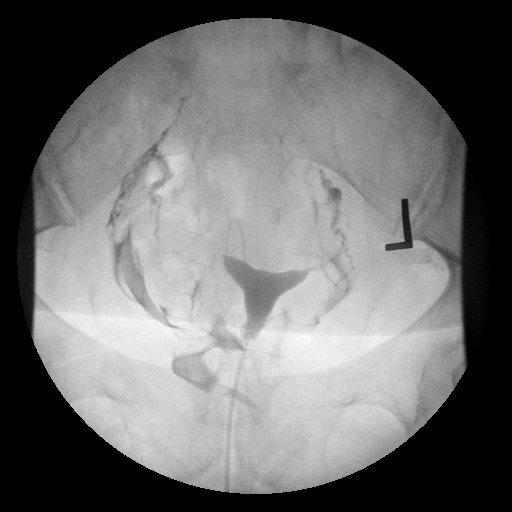

[3 of 3 positions shown; findings below may reference images not displayed]

FINDINGS: There are no fixed filling defects within the uterine
cavity.  The contour is normal.  Both fallopian tubes fill and have
a normal appearance.  There is free spill of contrast into both
adnexal regions.
IMPRESSION: Normal HSG.

## 2008-12-04 ENCOUNTER — Emergency Department (HOSPITAL_COMMUNITY): Admission: EM | Admit: 2008-12-04 | Discharge: 2008-12-04 | Payer: Self-pay | Admitting: Family Medicine

## 2009-04-03 ENCOUNTER — Inpatient Hospital Stay (HOSPITAL_COMMUNITY): Admission: AD | Admit: 2009-04-03 | Discharge: 2009-04-03 | Payer: Self-pay | Admitting: Obstetrics and Gynecology

## 2009-04-05 ENCOUNTER — Inpatient Hospital Stay (HOSPITAL_COMMUNITY): Admission: AD | Admit: 2009-04-05 | Discharge: 2009-04-06 | Payer: Self-pay | Admitting: Obstetrics and Gynecology

## 2009-05-16 ENCOUNTER — Ambulatory Visit (HOSPITAL_COMMUNITY): Admission: AD | Admit: 2009-05-16 | Discharge: 2009-05-16 | Payer: Self-pay | Admitting: Obstetrics and Gynecology

## 2009-06-02 ENCOUNTER — Inpatient Hospital Stay (HOSPITAL_COMMUNITY): Admission: AD | Admit: 2009-06-02 | Discharge: 2009-06-02 | Payer: Self-pay | Admitting: Obstetrics and Gynecology

## 2009-06-29 ENCOUNTER — Inpatient Hospital Stay (HOSPITAL_COMMUNITY): Admission: AD | Admit: 2009-06-29 | Discharge: 2009-06-29 | Payer: Self-pay | Admitting: Obstetrics and Gynecology

## 2009-07-09 ENCOUNTER — Inpatient Hospital Stay (HOSPITAL_COMMUNITY): Admission: AD | Admit: 2009-07-09 | Discharge: 2009-07-09 | Payer: Self-pay | Admitting: Obstetrics and Gynecology

## 2009-07-31 ENCOUNTER — Inpatient Hospital Stay (HOSPITAL_COMMUNITY): Admission: AD | Admit: 2009-07-31 | Discharge: 2009-08-05 | Payer: Self-pay | Admitting: Obstetrics and Gynecology

## 2009-08-02 ENCOUNTER — Encounter (INDEPENDENT_AMBULATORY_CARE_PROVIDER_SITE_OTHER): Payer: Self-pay | Admitting: Obstetrics and Gynecology

## 2009-08-19 ENCOUNTER — Inpatient Hospital Stay (HOSPITAL_COMMUNITY): Admission: AD | Admit: 2009-08-19 | Discharge: 2009-08-19 | Payer: Self-pay | Admitting: Obstetrics and Gynecology

## 2009-08-19 ENCOUNTER — Encounter: Payer: Self-pay | Admitting: Internal Medicine

## 2009-08-19 LAB — CONVERTED CEMR LAB
AST: 24 units/L
Albumin: 3.3 g/dL
Calcium: 8.9 mg/dL
Chloride: 108 meq/L
Creatinine, Ser: 0.66 mg/dL
Hemoglobin: 12.1 g/dL
Sodium: 142 meq/L
Total Protein: 6.2 g/dL

## 2010-03-25 ENCOUNTER — Emergency Department (HOSPITAL_COMMUNITY): Admission: EM | Admit: 2010-03-25 | Discharge: 2010-03-25 | Payer: Self-pay | Admitting: Family Medicine

## 2010-04-03 ENCOUNTER — Ambulatory Visit: Payer: Self-pay | Admitting: Internal Medicine

## 2010-04-04 LAB — CONVERTED CEMR LAB
ALT: 17 units/L (ref 0–35)
AST: 18 units/L (ref 0–37)
Albumin: 3.7 g/dL (ref 3.5–5.2)
Alkaline Phosphatase: 114 units/L (ref 39–117)
Basophils Relative: 0.5 % (ref 0.0–3.0)
Bilirubin Urine: NEGATIVE
Bilirubin, Direct: 0 mg/dL (ref 0.0–0.3)
Calcium: 9.7 mg/dL (ref 8.4–10.5)
Cholesterol: 218 mg/dL — ABNORMAL HIGH (ref 0–200)
Creatinine, Ser: 0.7 mg/dL (ref 0.4–1.2)
GFR calc non Af Amer: 100.98 mL/min (ref >60)
HDL: 60.7 mg/dL (ref >39.00)
Ketones, ur: NEGATIVE mg/dL
Leukocytes, UA: NEGATIVE
Lymphs Abs: 2.6 10*3/uL (ref 0.7–4.0)
MCHC: 34.7 g/dL (ref 30.0–36.0)
Monocytes Relative: 5.4 % (ref 3.0–12.0)
Potassium: 4.3 meq/L (ref 3.5–5.1)
Specific Gravity, Urine: 1.02 (ref 1.000–1.030)
TSH: 2.25 microintl units/mL (ref 0.35–5.50)
Total CHOL/HDL Ratio: 4
Total Protein: 7.3 g/dL (ref 6.0–8.3)
Triglycerides: 176 mg/dL — ABNORMAL HIGH (ref 0.0–149.0)
Urine Glucose: NEGATIVE mg/dL
WBC: 10.1 10*3/uL (ref 4.5–10.5)
pH: 6 (ref 5.0–8.0)

## 2010-04-05 ENCOUNTER — Encounter: Payer: Self-pay | Admitting: Internal Medicine

## 2010-04-05 DIAGNOSIS — R030 Elevated blood-pressure reading, without diagnosis of hypertension: Secondary | ICD-10-CM

## 2010-04-05 DIAGNOSIS — Z9189 Other specified personal risk factors, not elsewhere classified: Secondary | ICD-10-CM | POA: Insufficient documentation

## 2010-04-05 DIAGNOSIS — M5430 Sciatica, unspecified side: Secondary | ICD-10-CM | POA: Insufficient documentation

## 2010-04-05 DIAGNOSIS — N209 Urinary calculus, unspecified: Secondary | ICD-10-CM | POA: Insufficient documentation

## 2010-04-05 DIAGNOSIS — IMO0001 Reserved for inherently not codable concepts without codable children: Secondary | ICD-10-CM | POA: Insufficient documentation

## 2010-04-10 ENCOUNTER — Ambulatory Visit: Payer: Self-pay | Admitting: Internal Medicine

## 2010-04-10 DIAGNOSIS — F419 Anxiety disorder, unspecified: Secondary | ICD-10-CM | POA: Insufficient documentation

## 2010-05-15 ENCOUNTER — Ambulatory Visit: Payer: Self-pay | Admitting: Internal Medicine

## 2010-06-06 ENCOUNTER — Emergency Department (HOSPITAL_COMMUNITY)
Admission: EM | Admit: 2010-06-06 | Discharge: 2010-06-06 | Payer: Self-pay | Source: Home / Self Care | Admitting: Family Medicine

## 2010-06-12 ENCOUNTER — Ambulatory Visit: Payer: Self-pay | Admitting: Internal Medicine

## 2010-07-09 ENCOUNTER — Encounter: Payer: Self-pay | Admitting: Internal Medicine

## 2010-07-09 ENCOUNTER — Ambulatory Visit
Admission: RE | Admit: 2010-07-09 | Discharge: 2010-07-09 | Payer: Self-pay | Source: Home / Self Care | Attending: Internal Medicine | Admitting: Internal Medicine

## 2010-07-09 ENCOUNTER — Encounter (INDEPENDENT_AMBULATORY_CARE_PROVIDER_SITE_OTHER): Payer: Self-pay | Admitting: *Deleted

## 2010-07-09 DIAGNOSIS — J019 Acute sinusitis, unspecified: Secondary | ICD-10-CM | POA: Insufficient documentation

## 2010-07-16 NOTE — Assessment & Plan Note (Signed)
Summary: 1 MTH FU  STC   Vital Signs:  Patient profile:   25 year old female Height:      64 inches (162.56 cm) Weight:      274.12 pounds (124.60 kg) BMI:     47.22 O2 Sat:      99 % on Room air Temp:     98.4 degrees F (36.89 degrees C) oral Pulse rate:   81 / minute BP sitting:   102 / 68  (left arm) Cuff size:   large  Vitals Entered By: Orlan Leavens RMA (May 15, 2010 3:27 PM)  O2 Flow:  Room air CC: 1 month follow-up Is Patient Diabetic? No Pain Assessment Patient in pain? no        Primary Care Provider:  Newt Lukes MD  CC:  1 month follow-up.  History of Present Illness: here for f/u  obesity - enrolled in bariatric program - here for 2nd of 6 monthly check in with doc x 6 months before proceeding with intervention - not candidate for phenteramine due to anxiety (see next) - ineffective weight loss efforts with diet and exercise - has improved with inc walking efforts  anxiety - complicating feature of depression - long hx depression - exac postpartum - prev on wellbutrin 2007-8, resumed 12 week post partum for inc depression symptoms - now with panic and anxiety symptoms - not controlled with wellbutrin so changed to venlafax 04/10/10 - prev tried and intol of zoloft due to nausea and diarrhea - given valium by urg care for same but reluctant to use  Current Medications (verified): 1)  Nuvaring 0.12-0.015 Mg/24hr Ring (Etonogestrel-Ethinyl Estradiol) .... Use As Directed 2)  Alprazolam 0.5 Mg Tabs (Alprazolam) .... 1/2-2 By Mouth Three Times A Day As Needed For Anxiety and Panic Symptoms 3)  Venlafaxine Hcl 37.5 Mg Xr24h-Tab (Venlafaxine Hcl) .Marland Kitchen.. 1 By Mouth Once Daily X 2 Weeks, Then Increase To 2 By Mouth Once Daily (Or As Directed) 4)  Propranolol Hcl 10 Mg Tabs (Propranolol Hcl) .Marland Kitchen.. 1 By Mouth Tif As Needed For Palpitations  Allergies (verified): No Known Drug Allergies  Past History:  Past Medical  History: Hypertension Hypothyroidism Depression obesity  Review of Systems  The patient denies anorexia, weight gain, chest pain, headaches, and abdominal pain.    Physical Exam  General:  obese, alert, well-developed, well-nourished, and cooperative to examination.    Lungs:  normal respiratory effort, no intercostal retractions or use of accessory muscles; normal breath sounds bilaterally - no crackles and no wheezes.    Heart:  normal rate, regular rhythm, no murmur, and no rub. BLE without edema. Psych:  Oriented X3, memory intact for recent and remote, normally interactive, good eye contact, not anxious appearing, not depressed appearing, and not agitated.      Impression & Recommendations:  Problem # 1:  DEPRESSION (ICD-311)  Her updated medication list for this problem includes:    Alprazolam 0.5 Mg Tabs (Alprazolam) .Marland Kitchen... 1/2-2 by mouth three times a day as needed for anxiety and panic symptoms    Venlafaxine Hcl 75 Mg Xr24h-tab (Venlafaxine hcl) .Marland Kitchen... 1 by mouth once daily  exac by anxiety and panic attack symptoms - changed wellbutrin to effexor (generic) 03/2010 - doing well - cont same cont use alpraz in place of valium as needed - also propanolol for Bbloc effects as needed of panic symptoms   Orders: Prescription Created Electronically [id number])  Problem # 2:  OBESITY (ICD-278.00)  2 of 6 OV doen  today issues re: need for weight loss and plans to do so with diet changes and inc exercise again reviewed monthly form completed and returned to pt for baratric enrollment plans Ht: 64 (04/10/2010)   Wt: 277.12 (04/10/2010)   BMI: 47.74 (04/10/2010)  Ht: 64 (05/15/2010)   Wt: 274.12 (05/15/2010)   BMI: 47.74 (04/10/2010)  Time spent with patient 25 minutes, more than 50% of this time was spent counseling patient on weight loss effort and antidepressant tx as ongoing  Complete Medication List: 1)  Nuvaring 0.12-0.015 Mg/24hr Ring (Etonogestrel-ethinyl estradiol) ....  Use as directed 2)  Alprazolam 0.5 Mg Tabs (Alprazolam) .... 1/2-2 by mouth three times a day as needed for anxiety and panic symptoms 3)  Venlafaxine Hcl 75 Mg Xr24h-tab (Venlafaxine hcl) .Marland Kitchen.. 1 by mouth once daily 4)  Propranolol Hcl 10 Mg Tabs (Propranolol hcl) .Marland Kitchen.. 1 by mouth three times a day as needed for palpitations  Patient Instructions: 1)  it was good to see you today. 2)  will complete form for bariatric clinic as dicsussed 3)  continue venlfaxine 75mg  once daily  - 4)  continue to use alprazolam and/or propanolol as discussed for anxiety or panic symptoms  5)  your new prescription dose of effexor (venlafax) has been electronically submitted to your pharmacy. Please take as directed. Contact our office if you believe you're having problems with the medication(s).  6)  Please schedule a follow-up appointment in 1 month for weight check and depression review, call sooner if problems.  Prescriptions: VENLAFAXINE HCL 75 MG XR24H-TAB (VENLAFAXINE HCL) 1 by mouth once daily  #30 x 3   Entered and Authorized by:   Newt Lukes MD   Signed by:   Newt Lukes MD on 05/15/2010   Method used:   Electronically to        [facility]* (retail)       [address]       [address] Shipping/mailing       [place], [place]  [address]       Ph: [phone]       Fax: [phone]   RxID:   [id number]    Orders Added: 1)  Est. Patient Level IV [84132] 2)  Prescription Created Electronically [phone]

## 2010-07-16 NOTE — Letter (Signed)
Summary: Weight Loss Documentation  Weight Loss Documentation   Imported By: Sherian Rein 04/12/2010 10:11:08  _____________________________________________________________________  External Attachment:    Type:   Image     Comment:   External Document

## 2010-07-16 NOTE — Assessment & Plan Note (Signed)
Summary: NEW PT PHY--PKG--#-- STC   Vital Signs:  Patient profile:   25 year old female Height:      64 inches (162.56 cm) Weight:      277.12 pounds (125.96 kg) BMI:     47.74 O2 Sat:      98 % on Room air Temp:     98.3 degrees F (36.83 degrees C) oral Pulse rate:   109 / minute BP sitting:   102 / 82  (left arm) Cuff size:   large  Vitals Entered By: Orlan Leavens RMA (April 10, 2010 1:58 PM)  O2 Flow:  Room air CC: New patient CPX Is Patient Diabetic? No Pain Assessment Patient in pain? no        Primary Care Provider:  Newt Lukes MD  CC:  New patient CPX.  History of Present Illness: new pt to me and out practice, here to est care  patient is here today for annual physical. Patient feels well.   would also like review chronic med concerns - obesity - enrolled in bariatric program - needs monthly check in with doc x 6 months before proceeding with intervention - not candidate for phenteramine due to anxiety (see next) - ineffective weight loss efforts with diet and exercise - though intends to inc walking efforts  anxiety - complicating feature of depression - long hx depression - exac postpartum - prev on wellbutrin 2007-8, resumed 12 week post partum for inc depression symptoms - now with panic and anxiety symptoms - not controlled with current meds - prev tried and intol of zoloft due to nausea and diarrhea - given valium by urg care for same but reluctant to use  Preventive Screening-Counseling & Management  Alcohol-Tobacco     Alcohol drinks/day: 0     Alcohol Counseling: not indicated; patient does not drink     Smoking Status: never     Tobacco Counseling: not indicated; no tobacco use  Caffeine-Diet-Exercise     Exercise Counseling: to improve exercise regimen  Safety-Violence-Falls     Seat Belt Counseling: not indicated; patient wears seat belts     Firearm Counseling: not indicated; uses recommended firearm safety measures     Fall Risk  Counseling: not indicated; no significant falls noted  Clinical Review Panels:  Prevention   Last Pap Smear:  Interpretation Result:Negative for intraepithelial Lesion or Malignancy.    (12/14/2009)  Immunizations   Last Tetanus Booster:  Historical (06/16/2006)  Lipid Management   Cholesterol:  218 (04/03/2010)   HDL (good cholesterol):  60.70 (04/03/2010)  CBC   WBC:  10.1 (04/03/2010)   RBC:  4.55 (04/03/2010)   Hgb:  13.3 (04/03/2010)   Hct:  38.4 (04/03/2010)   Platelets:  310.0 (04/03/2010)   MCV  84.3 (04/03/2010)   MCHC  34.7 (04/03/2010)   RDW  14.0 (04/03/2010)   PMN:  68.1 (04/03/2010)   Lymphs:  25.4 (04/03/2010)   Monos:  5.4 (04/03/2010)   Eosinophils:  0.6 (04/03/2010)   Basophil:  0.5 (04/03/2010)  Complete Metabolic Panel   Glucose:  77 (04/03/2010)   Sodium:  142 (04/03/2010)   Potassium:  4.3 (04/03/2010)   Chloride:  107 (04/03/2010)   CO2:  27 (04/03/2010)   BUN:  11 (04/03/2010)   Creatinine:  0.7 (04/03/2010)   Albumin:  3.7 (04/03/2010)   Total Protein:  7.3 (04/03/2010)   Calcium:  9.7 (04/03/2010)   Total Bili:  0.1 (04/03/2010)   Alk Phos:  114 (04/03/2010)  SGPT (ALT):  17 (04/03/2010)   SGOT (AST):  18 (04/03/2010)   Current Medications (verified): 1)  Nuvaring 0.12-0.015 Mg/24hr Ring (Etonogestrel-Ethinyl Estradiol) .... Use As Directed 2)  Wellbutrin Xl 150 Mg Xr24h-Tab (Bupropion Hcl) .... Take 1 By Mouth Once Daily 3)  Valium 5 Mg Tabs (Diazepam) .... Use As Needed  Allergies (verified): No Known Drug Allergies  Past History:  Past Medical History: Hypertension Hypothyroidism Depression  Family History: Family History Diabetes 1st degree relative (grandparent) Family History High cholesterol (parent, grandparent) Family History Hypertension (parent, grandparent)  Social History: never smoked, no alcohol married, lives with spouse and dtr born 07/2009 employed as Associate Professor at [employer] Status:  never  Review  of Systems       see HPI above. I have reviewed all other systems and they were negative.   Physical Exam  General:  obese, alert, well-developed, well-nourished, and cooperative to examination.    Head:  Normocephalic and atraumatic without obvious abnormalities. No apparent alopecia or balding. Eyes:  vision grossly intact; pupils equal, round and reactive to light.  conjunctiva and lids normal.    Ears:  normal pinnae bilaterally, without erythema, swelling, or tenderness to palpation. TMs clear, without effusion, or cerumen impaction. Hearing grossly normal bilaterally  Mouth:  teeth and gums in good repair; mucous membranes moist, without lesions or ulcers. oropharynx clear without exudate, erythema.  Neck:  thick, supple, full ROM, no masses, no thyromegaly; no thyroid nodules or tenderness. no JVD or carotid bruits.   Lungs:  normal respiratory effort, no intercostal retractions or use of accessory muscles; normal breath sounds bilaterally - no crackles and no wheezes.    Heart:  normal rate, regular rhythm, no murmur, and no rub. BLE without edema. normal DP pulses and normal cap refill in all 4 extremities    Abdomen:  soft, non-tender, normal bowel sounds, no distention; no masses and no appreciable hepatomegaly or splenomegaly.   Genitalia:  defer gyn Msk:  No deformity or scoliosis noted of thoracic or lumbar spine.   Neurologic:  alert & oriented X3 and cranial nerves II-XII symetrically intact.  strength normal in all extremities, sensation intact to light touch, and gait normal. speech fluent without dysarthria or aphasia; follows commands with good comprehension.  Skin:  no rashes, vesicles, ulcers, or erythema. No nodules or irregularity to palpation.  Psych:  Oriented X3, memory intact for recent and remote, normally interactive, good eye contact, not anxious appearing, not depressed appearing, and not agitated.      Impression & Recommendations:  Problem # 1:  PREVENTIVE  HEALTH CARE (ICD-V70.0) Patient has been counseled on age-appropriate routine health concerns for screening and prevention. These are reviewed and up-to-date. Immunizations are up-to-date or declined. Labs reviewed  Problem # 2:  DEPRESSION (ICD-311)  exac by anxiety and new panic attack symptoms - change wellbutrin to effexor (generic) - wean over time use alpraz in place of valium as needed - also propanolol for Bbloc effects as needed of panic symptoms  new erx done f/u 4-6 weeks on same - risk and potential benefit of each med explained and reviewe d- pt undersands and agrees Her updated medication list for this problem includes:    Bupropion Hcl 75 Mg Tabs (Bupropion hcl) .Marland Kitchen... 1 by mouth once daily x 2 weeks    Alprazolam 0.5 Mg Tabs (Alprazolam) .Marland Kitchen... 1/2-2 by mouth three times a day as needed for anxiety and panic symptoms    Venlafaxine Hcl 37.5 Mg Xr24h-tab (Venlafaxine  hcl) ..... 1 by mouth once daily x 2 weeks, then increase to 2 by mouth once daily (or as directed)  Orders: Prescription Created Electronically [id number])  Problem # 3:  OBESITY (ICD-278.00) issues re: need for weight loss and plans to do so with diet changes and inc exercise reviewed monthly form completed and returned to pt for baratric enrollment plans Ht: 64 (04/10/2010)   Wt: 277.12 (04/10/2010)   BMI: 47.74 (04/10/2010)  Complete Medication List: 1)  Nuvaring 0.12-0.015 Mg/24hr Ring (Etonogestrel-ethinyl estradiol) .... Use as directed 2)  Bupropion Hcl 75 Mg Tabs (Bupropion hcl) .Marland Kitchen.. 1 by mouth once daily x 2 weeks 3)  Alprazolam 0.5 Mg Tabs (Alprazolam) .... 1/2-2 by mouth three times a day as needed for anxiety and panic symptoms 4)  Venlafaxine Hcl 37.5 Mg Xr24h-tab (Venlafaxine hcl) .Marland Kitchen.. 1 by mouth once daily x 2 weeks, then increase to 2 by mouth once daily (or as directed) 5)  Propranolol Hcl 10 Mg Tabs (Propranolol hcl) .Marland Kitchen.. 1 by mouth tif as needed for palpitations  Patient Instructions: 1)  it  was good to see you today. 2)  labs reviewed - 3)  form completed for bariatric clinic as dicsussed 4)  stop wellbutrin 150 and valium 5)  start wellbutrin75 daily x 2 weeks then stop 6)  when off wellbutrin, start venlfaxine (generic effexor) - 37.5 once daily x 2 weeks- then increase to 75mg  once daily  7)  use alprazolam and/or propanolol as discussed for anxiety or panic symptoms  8)  your prescriptions have been electronically submitted (or called) to your pharmacy. Please take as directed. Contact our office if you believe you're having problems with the medication(s).  9)  Please schedule a follow-up appointment in 1 month, call sooner if problems.  Prescriptions: ALPRAZOLAM 0.5 MG TABS (ALPRAZOLAM) 1/2-2 by mouth three times a day as needed for anxiety and panic symptoms  #60 x 1   Entered and Authorized by:   Newt Lukes MD   Signed by:   Newt Lukes MD on 04/10/2010   Method used:   Printed then faxed to ...       [facility]* (retail)       [address]       [address] Shipping/mailing       [place], [place]  [address]       Ph: [phone]       Fax: [phone]   RxID:   [id number] PROPRANOLOL HCL 10 MG TABS (PROPRANOLOL HCL) 1 by mouth tif as needed for palpitations  #60 x 1   Entered and Authorized by:   Newt Lukes MD   Signed by:   Newt Lukes MD on 04/10/2010   Method used:   Electronically to        [facility]* (retail)       [address]       [address] Shipping/mailing       [place], [place]  [address]       Ph: [phone]       Fax: [phone]   RxID:   [id number] VENLAFAXINE HCL 37.5 MG XR24H-TAB (VENLAFAXINE HCL) 1 by mouth once daily x 2 weeks, then increase to 2 by mouth once daily (or as directed)  #60 x 0   Entered and Authorized by:   Newt Lukes MD   Signed by:   Newt Lukes MD on 04/10/2010  Method used:   Electronically to        [facility]* (retail)       [address]       [address] Shipping[address]mail[address]       [place], [place]  [address]       Ph: [phone]       Fax: [phone]   RxID:   [id number] BUPROPION HCL 75 MG TABS (BUPROPION HCL) 1 by mouth once daily x 2 weeks  #14 x 0   Entered and Authorized by:   Newt Lukes MD   Signed by:   Newt Lukes MD on 04/10/2010   Method used:   Electronically to        [facility]* (retail)       [address]       [address] Shipping[address]mail[address]       [place], [place]  [address]       Ph: [phone]       Fax: [phone]   RxID:   [id number]    Orders Added: 1)  New Patient 18-39 years [99385] 2)  New Patient Level III [99203] 3)  Prescription Created Electronically [phone]   Immunization History:  Tetanus/Td Immunization History:    Tetanus/Td:  historical (06/16/2006)   Immunization History:  Tetanus/Td Immunization History:    Tetanus/Td:  Historical (06/16/2006)   Pap Smear  Procedure date:  12/14/2009  Findings:      Interpretation Result:Negative for intraepithelial Lesion or Malignancy.

## 2010-07-16 NOTE — Letter (Signed)
Summary: Weight Loss Form/[facility]  [employer] Loss Form/[facility]   Imported By: Sherian Rein 05/17/2010 14:17:04  _____________________________________________________________________  External Attachment:    Type:   Image     Comment:   External Document

## 2010-07-18 NOTE — Assessment & Plan Note (Signed)
Summary: 1 MO ROV /NWS  #   Vital Signs:  Patient profile:   25 year old female Height:      64 inches (162.56 cm) Weight:      276.8 pounds (125.82 kg) BMI:     47.68 O2 Sat:      98 % on Room air Temp:     98.3 degrees F (36.83 degrees C) oral Pulse rate:   117 / minute BP sitting:   124 / 78  (left arm) Cuff size:   large  Vitals Entered By: Orlan Leavens RMA (June 12, 2010 3:57 PM)  O2 Flow:  Room air CC: 1 month follow-up Is Patient Diabetic? No Pain Assessment Patient in pain? no        Primary Care Basilia Stuckert:  Newt Lukes MD  CC:  1 month follow-up.  History of Present Illness: here for f/u  obesity - enrolled in bariatric program - here for 2nd of 6 monthly check in with doc x 6 months before proceeding with intervention - not candidate for phenteramine due to anxiety (see next) - ineffective weight loss efforts with diet and exercise - has improved with inc walking efforts  anxiety - complicating feature of depression - long hx depression - exac postpartum - prev on wellbutrin 2007-8, resumed 12 week post partum for inc depression symptoms - now with panic and anxiety symptoms - not controlled with wellbutrin so changed to venlafax 04/10/10 - prev tried and intol of zoloft due to nausea and diarrhea - given valium by urg care for same but reluctant to use  Clinical Review Panels:  CBC   WBC:  10.1 (04/03/2010)   RBC:  4.55 (04/03/2010)   Hgb:  13.3 (04/03/2010)   Hct:  38.4 (04/03/2010)   Platelets:  310.0 (04/03/2010)   MCV  84.3 (04/03/2010)   MCHC  34.7 (04/03/2010)   RDW  14.0 (04/03/2010)   PMN:  68.1 (04/03/2010)   Lymphs:  25.4 (04/03/2010)   Monos:  5.4 (04/03/2010)   Eosinophils:  0.6 (04/03/2010)   Basophil:  0.5 (04/03/2010)  Complete Metabolic Panel   Glucose:  77 (04/03/2010)   Sodium:  142 (04/03/2010)   Potassium:  4.3 (04/03/2010)   Chloride:  107 (04/03/2010)   CO2:  27 (04/03/2010)   BUN:  11 (04/03/2010)   Creatinine:   0.7 (04/03/2010)   Albumin:  3.7 (04/03/2010)   Total Protein:  7.3 (04/03/2010)   Calcium:  9.7 (04/03/2010)   Total Bili:  0.1 (04/03/2010)   Alk Phos:  114 (04/03/2010)   SGPT (ALT):  17 (04/03/2010)   SGOT (AST):  18 (04/03/2010)   Current Medications (verified): 1)  Nuvaring 0.12-0.015 Mg/24hr Ring (Etonogestrel-Ethinyl Estradiol) .... Use As Directed 2)  Alprazolam 0.5 Mg Tabs (Alprazolam) .... 1/2-2 By Mouth Three Times A Day As Needed For Anxiety and Panic Symptoms 3)  Venlafaxine Hcl 75 Mg Xr24h-Tab (Venlafaxine Hcl) .Marland Kitchen.. 1 By Mouth Once Daily 4)  Propranolol Hcl 10 Mg Tabs (Propranolol Hcl) .Marland Kitchen.. 1 By Mouth Three Times A Day As Needed For Palpitations  Allergies (verified): No Known Drug Allergies  Past History:  Past Medical History: Hypertension Hypothyroidism hx Depression obesity   MD roster: obgyn - dillard ([facility])  Review of Systems  The patient denies anorexia, fever, weight loss, chest pain, and headaches.    Physical Exam  General:  obese, alert, well-developed, well-nourished, and cooperative to examination.    Lungs:  normal respiratory effort, no intercostal retractions or use of  accessory muscles; normal breath sounds bilaterally - no crackles and no wheezes.    Heart:  normal rate, regular rhythm, no murmur, and no rub. BLE without edema.   Impression & Recommendations:  Problem # 1:  OBESITY (ICD-278.00)  3 of 6 OV done today issues re: need for weight loss and plans to do so with diet changes and inc exercise again reviewed surg sched will be on hold until after planned preg in spring 2012 but wishes to complete the series 6 OV now monthly form completed and returned to pt for baratric enrollment plans - scan into EMR  Time spent with patient 25 minutes, more than 50% of this time was spent counseling patient on weight loss effort and antidepressant tx as ongoing  Ht: 64 (04/10/2010)   Wt: 277.12 (04/10/2010)   BMI: 47.74  (04/10/2010)  Ht: 64 (05/15/2010)   Wt: 274.12 (05/15/2010)   BMI: 47.74 (04/10/2010)  Ht: 64 (06/12/2010)   Wt: 276.8 (06/12/2010)   BMI: 47.68 (06/12/2010)  Problem # 2:  DEPRESSION (ICD-311)  Her updated medication list for this problem includes:    Alprazolam 0.5 Mg Tabs (Alprazolam) .Marland Kitchen... 1/2-2 by mouth three times a day as needed for anxiety and panic symptoms    Venlafaxine Hcl 75 Mg Xr24h-tab (Venlafaxine hcl) .Marland Kitchen... 1 by mouth once daily  exac by anxiety and panic attack symptoms - changed wellbutrin to effexor (generic) 03/2010 - doing well - cont same cont use alpraz in place of valium as needed - discussed risk/benefit cont tx during planned upcoming preg - also advised review with ob  Complete Medication List: 1)  Nuvaring 0.12-0.015 Mg/24hr Ring (Etonogestrel-ethinyl estradiol) .... Use as directed 2)  Alprazolam 0.5 Mg Tabs (Alprazolam) .... 1/2-2 by mouth three times a day as needed for anxiety and panic symptoms 3)  Venlafaxine Hcl 75 Mg Xr24h-tab (Venlafaxine hcl) .Marland Kitchen.. 1 by mouth once daily 4)  Propranolol Hcl 10 Mg Tabs (Propranolol hcl) .Marland Kitchen.. 1 by mouth three times a day as needed for palpitations  Patient Instructions: 1)  it was good to see you today. 2)  completed form today for bariatric clinic as we discussed 3)  continue venlafaxine 75mg  once daily  - 4)  continue to use alprazolam and/or propanolol as discussed for anxiety or panic symptoms  5)  talk with dr. Normand Sloop about her opinion on these medications with upcoming pregnancy plans spring 2012 6)  Please schedule a follow-up appointment in 1 month for weight check and depression/anxiety review, call sooner if problems.    Orders Added: 1)  Est. Patient Level IV [65784]

## 2010-07-18 NOTE — Letter (Signed)
Summary: Weight Loss Form/[facility]  [employer] Loss Form/[facility]   Imported By: Sherian Rein 06/19/2010 09:42:39  _____________________________________________________________________  External Attachment:    Type:   Image     Comment:   External Document

## 2010-07-18 NOTE — Assessment & Plan Note (Signed)
Summary: 1 MTH FU  STC   Vital Signs:  Patient profile:   25 year old female Height:      64 inches (162.56 cm) Weight:      270.6 pounds (123 kg) BMI:     46.62 O2 Sat:      97 % on Room air Temp:     98.4 degrees F (36.89 degrees C) oral Pulse rate:   77 / minute BP sitting:   112 / 86  (left arm) Cuff size:   large  Vitals Entered By: Orlan Leavens RMA (July 09, 2010 2:17 PM)  O2 Flow:  Room air CC: 1 month follow-up Is Patient Diabetic? No Pain Assessment Patient in pain? no        Primary Care Provider:  Newt Lukes MD  CC:  1 month follow-up.  History of Present Illness: here for f/u  obesity - enrolled in bariatric program - here for 4th of 6 monthly check in with doc x 6 months before proceeding with intervention - not candidate for phenteramine due to anxiety (see next) - ineffective weight loss efforts with diet and exercise - has improved with inc walking efforts - has joined weight watchers 06/2010 - down 6# in 2 weeks  anxiety - complicating feature of depression - long hx depression - exac postpartum - prev on wellbutrin 2007-8, resumed 12 week post partum for inc depression symptoms - now with panic and anxiety symptoms - not controlled with wellbutrin so changed to venlafax 04/10/10 - prev tried and intol of zoloft due to nausea and diarrhea - given valium by urg care for same but reluctant to use  Clinical Review Panels:  CBC   WBC:  10.1 (04/03/2010)   RBC:  4.55 (04/03/2010)   Hgb:  13.3 (04/03/2010)   Hct:  38.4 (04/03/2010)   Platelets:  310.0 (04/03/2010)   MCV  84.3 (04/03/2010)   MCHC  34.7 (04/03/2010)   RDW  14.0 (04/03/2010)   PMN:  68.1 (04/03/2010)   Lymphs:  25.4 (04/03/2010)   Monos:  5.4 (04/03/2010)   Eosinophils:  0.6 (04/03/2010)   Basophil:  0.5 (04/03/2010)  Complete Metabolic Panel   Glucose:  77 (04/03/2010)   Sodium:  142 (04/03/2010)   Potassium:  4.3 (04/03/2010)   Chloride:  107 (04/03/2010)   CO2:  27  (04/03/2010)   BUN:  11 (04/03/2010)   Creatinine:  0.7 (04/03/2010)   Albumin:  3.7 (04/03/2010)   Total Protein:  7.3 (04/03/2010)   Calcium:  9.7 (04/03/2010)   Total Bili:  0.1 (04/03/2010)   Alk Phos:  114 (04/03/2010)   SGPT (ALT):  17 (04/03/2010)   SGOT (AST):  18 (04/03/2010)   Current Medications (verified): 1)  Alprazolam 0.5 Mg Tabs (Alprazolam) .... 1/2-2 By Mouth Three Times A Day As Needed For Anxiety and Panic Symptoms 2)  Venlafaxine Hcl 75 Mg Xr24h-Tab (Venlafaxine Hcl) .Marland Kitchen.. 1 By Mouth Once Daily 3)  Propranolol Hcl 10 Mg Tabs (Propranolol Hcl) .Marland Kitchen.. 1 By Mouth Three Times A Day As Needed For Palpitations  Allergies (verified): No Known Drug Allergies  Past History:  Past Medical History: Hypertension  Hypothyroidism hx Depression obesity   MD roster:  obgyn - dillard ([employer] ob)  Review of Systems  The patient denies weight gain, chest pain, and dyspnea on exertion.         sinus congestion and pressure x 5 days - no fever  Physical Exam  General:  obese, alert, well-developed, well-nourished, and  cooperative to examination.    Head:  Normocephalic and atraumatic without obvious abnormalities. No apparent alopecia or balding. Eyes:  vision grossly intact; pupils equal, round and reactive to light.  conjunctiva and lids normal.    Ears:  R ear normal and L ear normal.   Nose:  tender over maxillary sinus B Mouth:  teeth and gums in good repair; mucous membranes moist, without lesions or ulcers. oropharynx clear without exudate, mild erythema. +pnd Lungs:  normal respiratory effort, no intercostal retractions or use of accessory muscles; normal breath sounds bilaterally - no crackles and no wheezes.    Heart:  normal rate, regular rhythm, no murmur, and no rub. BLE without edema.   Impression & Recommendations:  Problem # 1:  OBESITY (ICD-278.00)  4 of 6 OV done today issues re: need for weight loss and plans to do so with diet changes and inc  exercise again reviewed -  surg sched will be on hold until after planned preg in spring 2012 but wishes to complete the series 6 OV now monthly form completed and returned to pt for baratric enrollment plans - scan into EMR  Time spent with patient 25 minutes, more than 50% of this time was spent counseling patient on weight loss effort and antidepressant tx as ongoing  Ht: 64 (04/10/2010)   Wt: 277.12 (04/10/2010)   BMI: 47.74 (04/10/2010)  Ht: 64 (05/15/2010)   Wt: 274.12 (05/15/2010)   BMI: 47.74 (04/10/2010)  Ht: 64 (06/12/2010)   Wt: 276.8 (06/12/2010)   BMI: 47.68 (06/12/2010)  Ht: 64 (07/09/2010)   Wt: 270.6 (07/09/2010)   BMI: 46.62 (07/09/2010)  Problem # 2:  ACUTE MAXILLARY SINUSITIS (ICD-461.0)  Her updated medication list for this problem includes:    Azithromycin 250 Mg Tabs (Azithromycin) .Marland Kitchen... 2 tabs by mouth today, then 1 by mouth daily starting tomorrow    Flonase 50 Mcg/act Susp (Fluticasone propionate) .Marland Kitchen... 2 sprays each nostril once daily  Instructed on treatment. Call if symptoms persist or worsen.   Orders: Prescription Created Electronically [phone])  Complete Medication List: 1)  Alprazolam 0.5 Mg Tabs (Alprazolam) .... 1/2-2 by mouth three times a day as needed for anxiety and panic symptoms 2)  Venlafaxine Hcl 75 Mg Xr24h-tab (Venlafaxine hcl) .Marland Kitchen.. 1 by mouth once daily 3)  Propranolol Hcl 10 Mg Tabs (Propranolol hcl) .Marland Kitchen.. 1 by mouth three times a day as needed for palpitations 4)  Azithromycin 250 Mg Tabs (Azithromycin) .... 2 tabs by mouth today, then 1 by mouth daily starting tomorrow 5)  Flonase 50 Mcg/act Susp (Fluticasone propionate) .... 2 sprays each nostril once daily  Patient Instructions: 1)  it was good to see you today. 2)  completed form today for bariatric clinic as we discussed 3)  letter for weight watchers "prescription" provided 4)  continue venlafaxine 75mg  once daily  - 5)  continue to use alprazolam and/or propanolol as discussed  for anxiety or panic symptoms  6)  Zpak and flonase for sinus symptoms - your prescriptions have been electronically submitted to your pharmacy. Please take as directed. Contact our office if you believe you're having problems with the medication(s).  7)  Please schedule a follow-up appointment in 1 month for weight check and depression/anxiety review, call sooner if problems.  Prescriptions: FLONASE 50 MCG/ACT SUSP (FLUTICASONE PROPIONATE) 2 sprays each nostril once daily  #1 x 3   Entered and Authorized by:   Newt Lukes MD   Signed by:   Newt Lukes MD  on 07/09/2010   Method used:   Electronically to        [facility]* (retail)       [address]       [address] Shipping/mailing       [place], [place]  [address]       Ph: [phone]       Fax: [phone]   RxID:   [id number] AZITHROMYCIN 250 MG TABS (AZITHROMYCIN) 2 tabs by mouth today, then 1 by mouth daily starting tomorrow  #6 x 0   Entered and Authorized by:   Newt Lukes MD   Signed by:   Newt Lukes MD on 07/09/2010   Method used:   Electronically to        [facility]* (retail)       [address]       [address] Shipping/mailing       [place], [place]  [address]       Ph: [phone]       Fax: [phone]   RxID:   [id number]    Orders Added: 1)  Est. Patient Level IV [66063] 2)  Prescription Created Electronically [phone]

## 2010-07-18 NOTE — Letter (Signed)
Summary: Weight Loss Form/[facility]  [employer] Loss Form/[facility]   Imported By: Sherian Rein 07/12/2010 15:04:24  _____________________________________________________________________  External Attachment:    Type:   Image     Comment:   External Document

## 2010-07-18 NOTE — Letter (Signed)
Summary: Generic Letter  [facility]  [address] [place], [place] [address]   Phone: [phone]  Fax: [phone]    07/09/2010  [facility] [address] [place], [place]  [address]  To Whom May Concern,  I medically prescribe Weight Watchers for weight loss for this patient.     Sincerely,   Dr. Rene Paci

## 2010-08-05 ENCOUNTER — Ambulatory Visit (INDEPENDENT_AMBULATORY_CARE_PROVIDER_SITE_OTHER): Payer: 59 | Admitting: Internal Medicine

## 2010-08-05 ENCOUNTER — Encounter: Payer: Self-pay | Admitting: Internal Medicine

## 2010-08-05 DIAGNOSIS — J0100 Acute maxillary sinusitis, unspecified: Secondary | ICD-10-CM

## 2010-08-05 DIAGNOSIS — N912 Amenorrhea, unspecified: Secondary | ICD-10-CM | POA: Insufficient documentation

## 2010-08-05 LAB — CONVERTED CEMR LAB: Beta hcg, urine, semiquantitative: POSITIVE

## 2010-08-12 ENCOUNTER — Ambulatory Visit (INDEPENDENT_AMBULATORY_CARE_PROVIDER_SITE_OTHER): Payer: 59 | Admitting: Internal Medicine

## 2010-08-12 ENCOUNTER — Encounter: Payer: Self-pay | Admitting: Internal Medicine

## 2010-08-12 DIAGNOSIS — J0100 Acute maxillary sinusitis, unspecified: Secondary | ICD-10-CM

## 2010-08-12 DIAGNOSIS — E669 Obesity, unspecified: Secondary | ICD-10-CM

## 2010-08-13 NOTE — Assessment & Plan Note (Signed)
Summary: SINUS INFECTION/NWS   Vital Signs:  Patient profile:   25 year old female Height:      64 inches (162.56 cm) Weight:      267.6 pounds (121.64 kg) O2 Sat:      99 % on Room air Temp:     97.6 degrees F (36.44 degrees C) oral Pulse rate:   105 / minute BP sitting:   122 / 72  (left arm) Cuff size:   large  Vitals Entered By: Orlan Leavens RMA (August 05, 2010 3:46 PM)  O2 Flow:  Room air CC: sinus infection, URI symptoms Is Patient Diabetic? No Pain Assessment Patient in pain? no      Comments Pt also want to have urine pregnacy done. She states she is 11 days late   Primary Care Provider:  Newt Lukes MD  CC:  sinus infection and URI symptoms.  History of Present Illness:  URI Symptoms      This is a 25 year old woman who presents with URI symptoms.  The symptoms began 1 month ago ago.  The severity is described as mild.  not improved with Zpak, saline irrigation and flonase - .  The patient reports nasal congestion, clear nasal discharge, purulent nasal discharge, and sore throat, but denies dry cough, productive cough, earache, and sick contacts.  The patient denies fever, stiff neck, dyspnea, wheezing, rash, vomiting, diarrhea, use of an antipyretic, and response to antipyretic.  The patient also reports response to antihistamine.  The patient denies itchy throat, sneezing, seasonal symptoms, headache, muscle aches, and severe fatigue.  The patient denies the following risk factors for Strep sinusitis: double sickening, tooth pain, Strep exposure, and tender adenopathy.    also reviewed prior OV here for f/u  obesity - enrolled in bariatric program - here for 4th of 6 monthly check in with doc x 6 months before proceeding with intervention - not candidate for phenteramine due to anxiety (see next) - ineffective weight loss efforts with diet and exercise - has improved with inc walking efforts - has joined weight watchers 06/2010 - down 6# in 2 weeks  anxiety  - complicating feature of depression - long hx depression - exac postpartum - prev on wellbutrin 2007-8, resumed 12 week post partum for inc depression symptoms - now with panic and anxiety symptoms - not controlled with wellbutrin so changed to venlafax 04/10/10 - prev tried and intol of zoloft due to nausea and diarrhea - given valium by urg care for same but reluctant to use   Current Medications (verified): 1)  Alprazolam 0.5 Mg Tabs (Alprazolam) .... 1/2-2 By Mouth Three Times A Day As Needed For Anxiety and Panic Symptoms 2)  Venlafaxine Hcl 75 Mg Xr24h-Tab (Venlafaxine Hcl) .Marland Kitchen.. 1 By Mouth Once Daily 3)  Propranolol Hcl 10 Mg Tabs (Propranolol Hcl) .Marland Kitchen.. 1 By Mouth Three Times A Day As Needed For Palpitations 4)  Flonase 50 Mcg/act Susp (Fluticasone Propionate) .... 2 Sprays Each Nostril Once Daily 5)  Prenatal Vitamins Tabs (Prenatal Multivit-Min-Fe-Fa) .... Take 1 By Mouth Once Daily  Allergies (verified): No Known Drug Allergies  Past History:  Past Medical History: Hypertension  Hypothyroidism hx  Depression obesity   MD roster:  obgyn - dillard ([facility])  Review of Systems  The patient denies fever, vision loss, decreased hearing, hoarseness, prolonged cough, headaches, hemoptysis, and abdominal pain.         also 11d late period, ?preg  Physical Exam  General:  obese,  alert, well-developed, well-nourished, and cooperative to examination.    Head:  Normocephalic and atraumatic without obvious abnormalities. No apparent alopecia or balding. Eyes:  vision grossly intact; pupils equal, round and reactive to light.  conjunctiva and lids normal.    Lungs:  normal respiratory effort, no intercostal retractions or use of accessory muscles; normal breath sounds bilaterally - no crackles and no wheezes.    Heart:  normal rate, regular rhythm, no murmur, and no rub. BLE without edema. Neurologic:  alert & oriented X3 and cranial nerves II-XII symetrically intact.  strength  normal in all extremities, sensation intact to light touch, and gait normal. speech fluent without dysarthria or aphasia; follows commands with good comprehension.    Impression & Recommendations:  Problem # 1:  ACUTE MAXILLARY SINUSITIS (ICD-461.0) not improved with Zpal and nasal steroids -  no red flags on hx or exam - also newly dx preg today - 1st trimester so will avoid as many meds as possible augmentin safe choice if symptoms worse or unimproved - explained same to pt who understands and agrees rec cont nasal steroid and benadryl and saline irrigation Her updated medication list for this problem includes:    Flonase 50 Mcg/act Susp (Fluticasone propionate) .Marland Kitchen... 2 sprays each nostril once daily  Problem # 2:  AMENORRHEA (ICD-626.0) 11d late period- UPT today +,; pt will f/u OBgyn on same  Complete Medication List: 1)  Alprazolam 0.5 Mg Tabs (Alprazolam) .... 1/2-2 by mouth three times a day as needed for anxiety and panic symptoms 2)  Venlafaxine Hcl 75 Mg Xr24h-tab (Venlafaxine hcl) .Marland Kitchen.. 1 by mouth once daily 3)  Propranolol Hcl 10 Mg Tabs (Propranolol hcl) .Marland Kitchen.. 1 by mouth three times a day as needed for palpitations 4)  Flonase 50 Mcg/act Susp (Fluticasone propionate) .... 2 sprays each nostril once daily 5)  Prenatal Vitamins Tabs (prenatal Multivit-min-fe-fa)  .... Take 1 by mouth once daily  Patient Instructions: 1)  it was good to see you today. 2)  urine preg test positive today 3)  will continue the nasal steroid, saline irrigation and benadryl -if needed for fever or worsening symptoms, will consider use of augmentin (safe in pregnancy) 4)  it was good to see you today. 5)  completed form today for bariatric clinic as we discussed 6)  letter for weight watchers "prescription" provided 7)  continue venlafaxine 75mg  once daily  - 8)  continue to use alprazolam and/or propanolol as discussed for anxiety or panic symptoms  9)  Zpak and flonase for sinus symptoms - your  prescriptions have been electronically submitted to your pharmacy. Please take as directed. Contact our office if you believe you're having problems with the medication(s).  10)  Please schedule a follow-up appointment in 1 month for weight check and depression/anxiety review, call sooner if problems.    Orders Added: 1)  Est. Patient Level III [99213]    Laboratory Results   Urine Tests      Urine HCG: positive Comments: CONGRATULATIONS!!!!

## 2010-08-22 NOTE — Letter (Signed)
Summary: Wt loss documentation  Wt loss documentation   Imported By: Lester  08/14/2010 07:55:39  _____________________________________________________________________  External Attachment:    Type:   Image     Comment:   External Document

## 2010-08-22 NOTE — Assessment & Plan Note (Signed)
Summary: 1 mo rov /nws  #   Vital Signs:  Patient profile:   25 year old female Height:      64 inches (162.56 cm) Weight:      258.12 pounds (117.33 kg) BMI:     44.47 O2 Sat:      97 % on Room air Temp:     98.0 degrees F (36.67 degrees C) oral Pulse rate:   97 / minute BP sitting:   100 / 72  (left arm) Cuff size:   large  Vitals Entered By: Orlan Leavens RMA (August 12, 2010 3:47 PM)  O2 Flow:  Room air CC: 1 month follow-up Is Patient Diabetic? No Pain Assessment Patient in pain? no        Primary Care Provider:  Newt Lukes MD  CC:  1 month follow-up.  History of Present Illness:  seen last week for URI  - OV reviewed      This is a 25 year old woman who presents with URI symptoms.  The symptoms began 1 month ago ago.  The severity is described as mild.  not improved with Zpak, saline irrigation and flonase - .  The patient reports nasal congestion, clear nasal discharge, purulent nasal discharge, and sore throat, but denies dry cough, productive cough, earache, and sick contacts.  The patient denies fever, stiff neck, dyspnea, wheezing, rash, vomiting, diarrhea, use of an antipyretic, and response to antipyretic.  The patient also reports response to antihistamine.  The patient denies itchy throat, sneezing, seasonal symptoms, headache, muscle aches, and severe fatigue.  The patient denies the following risk factors for Strep sinusitis: double sickening, tooth pain, Strep exposure, and tender adenopathy.    cont max pain - feels abx needed despite desire to avoid same prev (1st trimester preg)  also here for f/u  obesity - enrolled in bariatric program - here for 5th of 6 monthly check in with doc x 6 months before proceeding with intervention - not candidate for phenteramine due to anxiety (see next) - ineffective weight loss efforts with diet and exercise - has improved with inc walking efforts - joined weight watchers 06/2010 - down 18# in 6 weeks  anxiety -  complicating feature of depression - long hx depression - exac postpartum - prev on wellbutrin 2007-8, resumed 12 week post partum for inc depression symptoms - now with panic and anxiety symptoms - not controlled with wellbutrin so changed to venlafax 04/10/10 - prev tried and intol of zoloft due to nausea and diarrhea - given valium by urg care for same but reluctant to use   Current Medications (verified): 1)  Alprazolam 0.5 Mg Tabs (Alprazolam) .... 1/2-2 By Mouth Three Times A Day As Needed For Anxiety and Panic Symptoms 2)  Venlafaxine Hcl 75 Mg Xr24h-Tab (Venlafaxine Hcl) .Marland Kitchen.. 1 By Mouth Once Daily 3)  Propranolol Hcl 10 Mg Tabs (Propranolol Hcl) .Marland Kitchen.. 1 By Mouth Three Times A Day As Needed For Palpitations 4)  Flonase 50 Mcg/act Susp (Fluticasone Propionate) .... 2 Sprays Each Nostril Once Daily 5)  Prenatal Vitamins Tabs (Prenatal Multivit-Min-Fe-Fa) .... Take 1 By Mouth Once Daily  Allergies (verified): No Known Drug Allergies  Past History:  Past Medical History: Hypertension  Hypothyroidism hx   Depression/anxiety obesity   MD roster:  obgyn - dillard ([place] [facility])  Review of Systems  The patient denies fever, weight gain, chest pain, headaches, and abdominal pain.    Physical Exam  General:  obese, alert, well-developed, well-nourished,  and cooperative to examination.    Ears:  R ear normal and L ear normal.   Nose:  tender over maxillary sinus B Mouth:  teeth and gums in good repair; mucous membranes moist, without lesions or ulcers. oropharynx clear without exudate, mild erythema. +pnd Lungs:  normal respiratory effort, no intercostal retractions or use of accessory muscles; normal breath sounds bilaterally - no crackles and no wheezes.    Heart:  normal rate, regular rhythm, no murmur, and no rub. BLE without edema. Psych:  Oriented X3, memory intact for recent and remote, normally interactive, good eye contact, not anxious appearing, not depressed appearing, and  not agitated.      Impression & Recommendations:  Problem # 1:  ACUTE MAXILLARY SINUSITIS (ICD-461.0)  Her updated medication list for this problem includes:    Flonase 50 Mcg/act Susp (Fluticasone propionate) .Marland Kitchen... 2 sprays each nostril once daily    Amoxicillin 500 Mg Tabs (Amoxicillin) .Marland Kitchen... 1 by mouth three times a day x 7 days  not improved with Zpak and nasal steroids -  no red flags on hx or exam - also early in 1st trimester preg so wish to avoid as many meds as possible erx amox since symptoms unimproved - explained same to pt who understands and agrees rec cont nasal steroid and benadryl and saline irrigation  Orders: Prescription Created Electronically [id number])  Problem # 2:  OBESITY (ICD-278.00)  5 of 6 OV done today issues re: need for weight loss and plans to do so with diet changes and inc exercise again reviewed -  surg sched will be on hold until after 2nd preg  but wishes to complete the series 6 OV now monthly form completed and returned to pt for baratric enrollment plans - scan into EMR  Time spent with patient 25 minutes, more than 50% of this time was spent counseling patient on weight loss effort and antidepressant tx as ongoing  Ht: 64 (04/10/2010)   Wt: 277.12 (04/10/2010)   BMI: 47.74 (04/10/2010)  Ht: 64 (05/15/2010)   Wt: 274.12 (05/15/2010)   BMI: 47.74 (04/10/2010)  Ht: 64 (06/12/2010)   Wt: 276.8 (06/12/2010)   BMI: 47.68 (06/12/2010)  Ht: 64 (07/09/2010)   Wt: 270.6 (07/09/2010)   BMI: 46.62 (07/09/2010)  Ht: 64 (08/12/2010)   Wt: 258.12 (08/12/2010)   BMI: 44.47 (08/12/2010)  Complete Medication List: 1)  Alprazolam 0.5 Mg Tabs (Alprazolam) .... 1/2-2 by mouth three times a day as needed for anxiety and panic symptoms 2)  Venlafaxine Hcl 75 Mg Xr24h-tab (Venlafaxine hcl) .Marland Kitchen.. 1 by mouth once daily 3)  Flonase 50 Mcg/act Susp (Fluticasone propionate) .... 2 sprays each nostril once daily 4)  Prenatal Vitamins Tabs (prenatal Multivit-min-fe-fa)   .... Take 1 by mouth once daily 5)  Amoxicillin 500 Mg Tabs (Amoxicillin) .Marland Kitchen.. 1 by mouth three times a day x 7 days  Patient Instructions: 1)  it was good to see you today. 2)  completed form today for bariatric clinic as we discussed 3)  continue venlafaxine 75mg  once daily  - 4)  Amoxicillin for sinus symptoms - your prescription has been electronically submitted to your pharmacy. Please take as directed. Contact our office if you believe you're having problems with the medication(s).  5)  Please schedule a follow-up appointment in 1 month for your last weight check, call sooner if problems.  Prescriptions: AMOXICILLIN 500 MG TABS (AMOXICILLIN) 1 by mouth three times a day x 7 days  #21 x 0   Entered and  Authorized by:   Newt Lukes MD   Signed by:   Newt Lukes MD on 08/12/2010   Method used:   Electronically to        [facility]* (retail)       [address]       [address] Shipping/mailing       [place], [place]  [address]       Ph: [phone]       Fax: [phone]   RxID:   [id number]    Orders Added: 1)  Est. Patient Level IV [10272] 2)  Prescription Created Electronically [phone]

## 2010-08-26 LAB — POCT RAPID STREP A (OFFICE): Streptococcus, Group A Screen (Direct): NEGATIVE

## 2010-09-01 LAB — URINE MICROSCOPIC-ADD ON

## 2010-09-01 LAB — CBC
HCT: 33.7 % — ABNORMAL LOW (ref 36.0–46.0)
Hemoglobin: 11.6 g/dL — ABNORMAL LOW (ref 12.0–15.0)
Hemoglobin: 12.3 g/dL (ref 12.0–15.0)
MCHC: 34.5 g/dL (ref 30.0–36.0)
MCHC: 34.1 g/dL (ref 30.0–36.0)
MCV: 88.4 fL (ref 78.0–100.0)
MCV: 89.4 fL (ref 78.0–100.0)
Platelets: 218 10*3/uL (ref 150–400)
RBC: 3.81 MIL/uL — ABNORMAL LOW (ref 3.87–5.11)
RDW: 15 % (ref 11.5–15.5)
WBC: 13.2 10*3/uL — ABNORMAL HIGH (ref 4.0–10.5)
WBC: 16.3 10*3/uL — ABNORMAL HIGH (ref 4.0–10.5)

## 2010-09-01 LAB — COMPREHENSIVE METABOLIC PANEL
AST: 16 U/L (ref 0–37)
AST: 16 U/L (ref 0–37)
Albumin: 2.9 g/dL — ABNORMAL LOW (ref 3.5–5.2)
Albumin: 2.9 g/dL — ABNORMAL LOW (ref 3.5–5.2)
Alkaline Phosphatase: 147 U/L — ABNORMAL HIGH (ref 39–117)
BUN: 7 mg/dL (ref 6–23)
BUN: 4 mg/dL — ABNORMAL LOW (ref 6–23)
CO2: 23 mEq/L (ref 19–32)
Calcium: 9 mg/dL (ref 8.4–10.5)
Calcium: 8.8 mg/dL (ref 8.4–10.5)
Chloride: 103 mEq/L (ref 96–112)
GFR calc Af Amer: >60 mL/min (ref >60)
GFR calc non Af Amer: >60 mL/min (ref >60)
Glucose, Bld: 97 mg/dL (ref 70–99)
Potassium: 3.8 mEq/L (ref 3.5–5.1)
Sodium: 134 mEq/L — ABNORMAL LOW (ref 135–145)
Sodium: 134 mEq/L — ABNORMAL LOW (ref 135–145)
Total Bilirubin: 0.6 mg/dL (ref 0.3–1.2)
Total Bilirubin: 0.4 mg/dL (ref 0.3–1.2)

## 2010-09-01 LAB — URINALYSIS, ROUTINE W REFLEX MICROSCOPIC
Protein, ur: NEGATIVE mg/dL
Specific Gravity, Urine: 1.01 (ref 1.005–1.030)

## 2010-09-01 LAB — LACTATE DEHYDROGENASE: LDH: 124 U/L (ref 94–250)

## 2010-09-01 LAB — URIC ACID: Uric Acid, Serum: 4.5 mg/dL (ref 2.4–7.0)

## 2010-09-03 LAB — ABO/RH

## 2010-09-03 LAB — CBC: HCT: 37 % (ref 36–46)

## 2010-09-03 LAB — HEPATITIS B SURFACE ANTIGEN: Hepatitis B Surface Ag: NEGATIVE

## 2010-09-03 LAB — RUBELLA ANTIBODY, IGM: Rubella: IMMUNE

## 2010-09-04 LAB — COMPREHENSIVE METABOLIC PANEL
ALT: 11 U/L (ref 0–35)
ALT: 14 U/L (ref 0–35)
Alkaline Phosphatase: 115 U/L (ref 39–117)
CO2: 25 mEq/L (ref 19–32)
Calcium: 9.1 mg/dL (ref 8.4–10.5)
Chloride: 107 mEq/L (ref 96–112)
Chloride: 107 mEq/L (ref 96–112)
GFR calc non Af Amer: >60 mL/min (ref >60)
Glucose, Bld: 72 mg/dL (ref 70–99)
Potassium: 4 mEq/L (ref 3.5–5.1)
Sodium: 137 mEq/L (ref 135–145)
Total Bilirubin: 0.4 mg/dL (ref 0.3–1.2)
Total Protein: 4.4 g/dL — ABNORMAL LOW (ref 6.0–8.3)

## 2010-09-04 LAB — CBC
HCT: 28.7 % — ABNORMAL LOW (ref 36.0–46.0)
Hemoglobin: 9.8 g/dL — ABNORMAL LOW (ref 12.0–15.0)
Platelets: 200 10*3/uL (ref 150–400)
RBC: 3.18 MIL/uL — ABNORMAL LOW (ref 3.87–5.11)
RBC: 4.04 MIL/uL (ref 3.87–5.11)
WBC: 11.3 10*3/uL — ABNORMAL HIGH (ref 4.0–10.5)
WBC: 15.1 10*3/uL — ABNORMAL HIGH (ref 4.0–10.5)

## 2010-09-04 LAB — LACTATE DEHYDROGENASE
LDH: 137 U/L (ref 94–250)
LDH: 266 U/L — ABNORMAL HIGH (ref 94–250)

## 2010-09-04 LAB — URIC ACID: Uric Acid, Serum: 5.3 mg/dL (ref 2.4–7.0)

## 2010-09-04 LAB — RPR: RPR Ser Ql: NONREACTIVE

## 2010-09-09 LAB — COMPREHENSIVE METABOLIC PANEL
ALT: 24 U/L (ref 0–35)
AST: 24 U/L (ref 0–37)
CO2: 26 mEq/L (ref 19–32)
Chloride: 108 mEq/L (ref 96–112)
Creatinine, Ser: 0.66 mg/dL (ref 0.4–1.2)
GFR calc Af Amer: >60 mL/min (ref >60)
GFR calc non Af Amer: >60 mL/min (ref >60)
Sodium: 142 mEq/L (ref 135–145)
Total Bilirubin: 0.3 mg/dL (ref 0.3–1.2)

## 2010-09-09 LAB — CBC
MCV: 89.2 fL (ref 78.0–100.0)
RBC: 4.07 MIL/uL (ref 3.87–5.11)
WBC: 8.1 10*3/uL (ref 4.0–10.5)

## 2010-09-09 LAB — URINALYSIS, ROUTINE W REFLEX MICROSCOPIC
Bilirubin Urine: NEGATIVE
Ketones, ur: NEGATIVE mg/dL
Specific Gravity, Urine: <1.005 — ABNORMAL LOW (ref 1.005–1.030)
Urobilinogen, UA: 0.2 mg/dL (ref 0.0–1.0)

## 2010-09-09 LAB — URINE MICROSCOPIC-ADD ON: WBC, UA: NONE SEEN WBC/hpf (ref <3)

## 2010-09-11 ENCOUNTER — Encounter: Payer: Self-pay | Admitting: Internal Medicine

## 2010-09-13 ENCOUNTER — Encounter: Payer: Self-pay | Admitting: Internal Medicine

## 2010-09-13 ENCOUNTER — Ambulatory Visit (INDEPENDENT_AMBULATORY_CARE_PROVIDER_SITE_OTHER): Payer: 59 | Admitting: Internal Medicine

## 2010-09-13 VITALS — BP 110/68 | HR 92 | Temp 98.1°F | Ht 64.0 in | Wt 265.1 lb

## 2010-09-13 DIAGNOSIS — E669 Obesity, unspecified: Secondary | ICD-10-CM

## 2010-09-13 DIAGNOSIS — F329 Major depressive disorder, single episode, unspecified: Secondary | ICD-10-CM

## 2010-09-13 DIAGNOSIS — O210 Mild hyperemesis gravidarum: Secondary | ICD-10-CM

## 2010-09-13 MED ORDER — ONDANSETRON 4 MG PO TBDP: 4.0000 mg | ORAL_TABLET | Freq: Three times a day (TID) | ORAL | Status: DC | PRN

## 2010-09-13 NOTE — Progress Notes (Signed)
  Subjective:    Patient ID: [name] [name], female    DOB: 02/20/1986, 25 y.o.   MRN: [id number]  HPI here for f/u  obesity - enrolled in bariatric program - here for 6th of 6 monthly check in with doc x 6 months before proceeding with intervention - not candidate for phenteramine due to anxiety (see next) - ineffective weight loss efforts with diet and exercise - has improved with inc walking efforts - joined weight watchers 06/2010 - down 18# in 6 weeks but now increase with new pregnancy reviewed  anxiety - complicating feature of depression - long hx depression - exac postpartum - prev on wellbutrin 2007-8, resumed 12 week post partum for inc depression symptoms - now with panic and anxiety symptoms - not controlled with wellbutrin so changed to venlafax 04/10/10 - prev tried and intol of zoloft due to nausea and diarrhea - given valium by urg care for same but reluctant to use  Past Medical History  Diagnosis Date  . HYPOTHYROIDISM 04/05/2010  . OBESITY 04/05/2010  . DEPRESSION 04/10/2010  . HYPERTENSION 04/05/2010  . Anxiety    Review of Systems  HENT: Negative for postnasal drip.   Respiratory: Negative for shortness of breath.   Cardiovascular: Negative for chest pain.  Neurological: Negative for headaches.       Objective:   Physical Exam BP 110/68  Pulse 92  Temp(Src) 98.1 F (36.7 C) (Oral)  Ht 5\' 4"  (1.626 m)  Wt 265 lb 1.9 oz (120.258 kg)  BMI 45.51 kg/m2 Physical Exam  Constitutional: She is oriented to person, place, and time. She appears well-developed and well-nourished. No distress. Neck: Normal range of motion. Neck supple. No JVD present. No thyromegaly present.  Cardiovascular: Normal rate, regular rhythm and normal heart sounds.  No murmur heard. Pulmonary/Chest: Effort normal and breath sounds normal. No respiratory distress. She has no wheezes.  Psychiatric: She has a normal mood and affect. Her behavior is normal. Judgment and thought content  normal.   Wt Readings from Last 3 Encounters:  09/13/10 265 lb 1.9 oz (120.258 kg)  08/12/10 258 lb 1.9 oz (117.082 kg)  08/05/10 267 lb 9.6 oz (121.383 kg)      Assessment & Plan:  See problem list. Medications and labs reviewed today.

## 2010-09-13 NOTE — Assessment & Plan Note (Signed)
The current medical regimen is effective;  continue present plan and medications.  

## 2010-09-13 NOTE — Patient Instructions (Signed)
It was good to see you today. Form completed today - good luck Zofran ODT for morning sickness as requested - Your prescription(s) have been submitted to your pharmacy. Please take as directed and contact our office if you believe you are having problem(s) with the medication(s). Please schedule followup in 6 months, call sooner if problems.

## 2010-09-13 NOTE — Assessment & Plan Note (Signed)
Ineffective relief with prometh from ob - change to zofram odt

## 2010-09-13 NOTE — Assessment & Plan Note (Signed)
Wt Readings from Last 3 Encounters:  09/13/10 265 lb 1.9 oz (120.258 kg)  08/12/10 258 lb 1.9 oz (117.082 kg)  08/05/10 267 lb 9.6 oz (121.383 kg)    6 of 6 OV done today issues re: need for weight loss and plans to do so with diet changes and inc exercise again reviewed -  surg sched will be on hold until after 2nd preg delivery but wishes to complete the series 6 OV now monthly form completed and returned to pt for bariatric enrollment plans - scan into EMR  Time spent with patient 25 minutes, more than 50% of this time was spent counseling patient on weight loss effort and antidepressant tx as ongoing

## 2010-09-16 LAB — DIFFERENTIAL
Eosinophils Relative: 0 % (ref 0–5)
Lymphocytes Relative: 15 % (ref 12–46)
Lymphs Abs: 2.1 10*3/uL (ref 0.7–4.0)
Monocytes Absolute: 0.8 10*3/uL (ref 0.1–1.0)

## 2010-09-16 LAB — CBC
HCT: 33.3 % — ABNORMAL LOW (ref 36.0–46.0)
Hemoglobin: 11.6 g/dL — ABNORMAL LOW (ref 12.0–15.0)
Platelets: 259 10*3/uL (ref 150–400)
WBC: 14.6 10*3/uL — ABNORMAL HIGH (ref 4.0–10.5)

## 2010-09-16 LAB — URINALYSIS, ROUTINE W REFLEX MICROSCOPIC
Bilirubin Urine: NEGATIVE
Glucose, UA: NEGATIVE mg/dL
Ketones, ur: NEGATIVE mg/dL
Nitrite: NEGATIVE
Protein, ur: NEGATIVE mg/dL
Protein, ur: NEGATIVE mg/dL
Specific Gravity, Urine: 1.01 (ref 1.005–1.030)
Urobilinogen, UA: 0.2 mg/dL (ref 0.0–1.0)

## 2010-09-16 LAB — URINE MICROSCOPIC-ADD ON

## 2010-09-16 LAB — URINE CULTURE
Culture: NO GROWTH
Culture: NO GROWTH

## 2010-09-17 LAB — RH IMMUNE GLOBULIN WORKUP (NOT WOMEN'S HOSP)
ABO/RH(D): O NEG
Antibody Screen: NEGATIVE

## 2010-09-19 LAB — DIFFERENTIAL
Eosinophils Absolute: 0 10*3/uL (ref 0.0–0.7)
Eosinophils Relative: 0 % (ref 0–5)
Lymphs Abs: 1.9 10*3/uL (ref 0.7–4.0)
Monocytes Absolute: 0.8 10*3/uL (ref 0.1–1.0)
Monocytes Relative: 6 % (ref 3–12)

## 2010-09-19 LAB — URINE CULTURE: Colony Count: >=100000

## 2010-09-19 LAB — URINALYSIS, ROUTINE W REFLEX MICROSCOPIC
Bilirubin Urine: NEGATIVE
Bilirubin Urine: NEGATIVE
Glucose, UA: NEGATIVE mg/dL
Glucose, UA: NEGATIVE mg/dL
Ketones, ur: NEGATIVE mg/dL
Ketones, ur: NEGATIVE mg/dL
Protein, ur: NEGATIVE mg/dL
pH: 6 (ref 5.0–8.0)

## 2010-09-19 LAB — URINE MICROSCOPIC-ADD ON: WBC, UA: NONE SEEN WBC/hpf (ref <3)

## 2010-09-19 LAB — CBC
HCT: 32 % — ABNORMAL LOW (ref 36.0–46.0)
MCHC: 34.3 g/dL (ref 30.0–36.0)
MCV: 89.3 fL (ref 78.0–100.0)
RBC: 3.58 MIL/uL — ABNORMAL LOW (ref 3.87–5.11)
WBC: 13.5 10*3/uL — ABNORMAL HIGH (ref 4.0–10.5)

## 2010-09-23 LAB — HCG, QUANTITATIVE, PREGNANCY: hCG, Beta Chain, Quant, S: 1223 m[IU]/mL — ABNORMAL HIGH (ref <5)

## 2010-12-04 ENCOUNTER — Other Ambulatory Visit: Payer: Self-pay | Admitting: Internal Medicine

## 2011-01-02 ENCOUNTER — Other Ambulatory Visit: Payer: Self-pay | Admitting: Obstetrics and Gynecology

## 2011-01-06 ENCOUNTER — Other Ambulatory Visit: Payer: Self-pay | Admitting: Obstetrics and Gynecology

## 2011-01-10 ENCOUNTER — Other Ambulatory Visit: Payer: Self-pay | Admitting: Obstetrics and Gynecology

## 2011-01-11 ENCOUNTER — Inpatient Hospital Stay (HOSPITAL_COMMUNITY)
Admission: AD | Admit: 2011-01-11 | Discharge: 2011-01-11 | Disposition: A | Discharge status: Home / Self Care | Payer: 59 | Source: Ambulatory Visit | Attending: Obstetrics and Gynecology | Admitting: Obstetrics and Gynecology

## 2011-01-11 ENCOUNTER — Encounter (HOSPITAL_COMMUNITY): Payer: Self-pay | Admitting: *Deleted

## 2011-01-11 DIAGNOSIS — M545 Low back pain, unspecified: Secondary | ICD-10-CM | POA: Insufficient documentation

## 2011-01-11 DIAGNOSIS — Z87442 Personal history of urinary calculi: Secondary | ICD-10-CM | POA: Insufficient documentation

## 2011-01-11 DIAGNOSIS — O9989 Other specified diseases and conditions complicating pregnancy, childbirth and the puerperium: Secondary | ICD-10-CM | POA: Insufficient documentation

## 2011-01-11 DIAGNOSIS — Z8759 Personal history of other complications of pregnancy, childbirth and the puerperium: Secondary | ICD-10-CM

## 2011-01-11 DIAGNOSIS — R319 Hematuria, unspecified: Secondary | ICD-10-CM | POA: Insufficient documentation

## 2011-01-11 DIAGNOSIS — Z331 Pregnant state, incidental: Secondary | ICD-10-CM

## 2011-01-11 HISTORY — DX: Female infertility, unspecified: N97.9

## 2011-01-11 LAB — DIFFERENTIAL
Basophils Absolute: 0 10*3/uL (ref 0.0–0.1)
Basophils Relative: 0 % (ref 0–1)
Eosinophils Relative: 1 % (ref 0–5)
Monocytes Absolute: 1.1 10*3/uL — ABNORMAL HIGH (ref 0.1–1.0)
Neutro Abs: 10.7 10*3/uL — ABNORMAL HIGH (ref 1.7–7.7)

## 2011-01-11 LAB — URINALYSIS, ROUTINE W REFLEX MICROSCOPIC
Glucose, UA: NEGATIVE mg/dL
Ketones, ur: NEGATIVE mg/dL
Leukocytes, UA: NEGATIVE
Protein, ur: 100 mg/dL — AB

## 2011-01-11 LAB — CBC
HCT: 34.6 % — ABNORMAL LOW (ref 36.0–46.0)
MCHC: 34.1 g/dL (ref 30.0–36.0)
MCV: 87.8 fL (ref 78.0–100.0)
RDW: 14.7 % (ref 11.5–15.5)

## 2011-01-11 MED ORDER — NITROFURANTOIN MONOHYD MACRO 100 MG PO CAPS: 100.0000 mg | ORAL_CAPSULE | Freq: Two times a day (BID) | ORAL | Status: AC

## 2011-01-11 NOTE — Progress Notes (Signed)
States has waves of back pain. Feels like when she had a kidney stone in the past.

## 2011-01-11 NOTE — ED Notes (Signed)
Conni Elliot, CNM in to see pt.

## 2011-01-11 NOTE — Progress Notes (Signed)
Pt states, " I started having pain in my left lower back at 6:00 pm, and it has gotten worse today. I've had kidney stones before and it feels like I did before. I took a vicodin and it helped some but didn't take the pain all the way away."

## 2011-01-11 NOTE — ED Provider Notes (Signed)
History     Chief Complaint  Patient presents with  . Back Pain   HPI Comments: Pt is a G2 P1,0,0,1 who reports at 28w 1d gestation with c/o of gradually increasing lt lat back pain with some rt sided pain as well.  States it feels crampy like when she has had kidney stones in the past.  She has not been seen by urology since 2010 when she was pregnant.  She also experienced kidney stones in 2008 as well as 2009 which did not require any surgery.  She has been seen by [facility] in the past.  She did use a vicodin she had on hand and had relief of the pain temporarily.  She stated she was concerned about the baby.    Back Pain This is a recurrent problem. The current episode started yesterday. The problem occurs constantly. The problem has been gradually worsening since onset. The pain is present in the lumbar spine. The quality of the pain is described as cramping. The pain does not radiate. The pain is at a severity of 5/10. The pain is moderate. The pain is the same all the time. Risk factors include obesity and pregnancy. She has tried analgesics for the symptoms. The treatment provided moderate relief.    OB History    Grav Para Term Preterm Abortions TAB SAB Ect Mult Living   2 1 1  0 0 0 0 0 0 1      Past Medical History  Diagnosis Date  . HYPOTHYROIDISM 04/05/2010  . OBESITY 04/05/2010  . DEPRESSION 04/10/2010  . HYPERTENSION 04/05/2010  . Anxiety   . Hypothyroidism   . Infertility, female   . Depression   . Kidney stones     Past Surgical History  Procedure Date  . Cesarean section   . Tonsillectomy   . Wisdom tooth extraction     Family History  Problem Relation Age of Onset  . Diabetes Other   . Hyperlipidemia Other   . Hypertension Other   . Hypertension Mother   . Hyperlipidemia Father   . Hypertension Father   . Kidney disease Father   . Hypertension Maternal Grandmother   . Diabetes Maternal Grandfather     History  Substance Use Topics  .  Smoking status: Never Smoker   . Smokeless tobacco: Never Used  . Alcohol Use: No    Allergies: No Known Allergies  Prescriptions prior to admission  Medication Sig Dispense Refill  . Prenatal Vit-Fe Psac Cmplx-FA (PRENATAL MULTIVITAMIN) 60-1 MG tablet Take 1 tablet by mouth daily.        Marland Kitchen venlafaxine (EFFEXOR-XR) 75 MG 24 hr capsule TAKE 1 CAPSULE BY MOUTH ONCE DAILY  30 capsule  3  . ALPRAZolam (XANAX) 0.5 MG tablet Take 0.5 mg by mouth 3 (three) times daily as needed.        . fluticasone (FLONASE) 50 MCG/ACT nasal spray by Nasal route daily.        . ondansetron (ZOFRAN-ODT) 4 MG disintegrating tablet Take 1 tablet (4 mg total) by mouth every 8 (eight) hours as needed for nausea.  30 tablet  0    Review of Systems  Constitutional: Negative.   HENT: Negative.  Negative for neck pain.   Eyes: Negative.   Respiratory: Negative.   Cardiovascular: Negative.   Gastrointestinal: Negative.   Genitourinary: Negative.   Musculoskeletal: Positive for back pain. Negative for myalgias, joint pain and falls.  Skin: Negative.   Neurological: Negative.   Endo/Heme/Allergies:  Negative.   Psychiatric/Behavioral: Negative.    Physical Exam   Blood pressure 133/74, pulse 90, temperature 98.4 F (36.9 C), temperature source Oral, resp. rate 18, height 6' 3.25" (1.911 m), weight 124.909 kg (275 lb 6 oz), last menstrual period 06/27/2010.  Physical Exam  Constitutional: She is oriented to person, place, and time. She appears well-developed and well-nourished.  HENT:  Head: Normocephalic and atraumatic.  Right Ear: External ear normal.  Left Ear: External ear normal.  Eyes: Conjunctivae are normal. Pupils are equal, round, and reactive to light.  Neck: Normal range of motion. Neck supple. No thyromegaly present.  Cardiovascular: Normal rate, regular rhythm and normal heart sounds.   Respiratory: Effort normal and breath sounds normal.  GI: Soft. Bowel sounds are normal. There is no  tenderness.  Genitourinary: Vagina normal.  Musculoskeletal: Normal range of motion.       Neg CVAT bilat.  Neurological: She is alert and oriented to person, place, and time. She has normal reflexes.  Skin: Skin is warm and dry.  Psychiatric: She has a normal mood and affect. Her behavior is normal. Judgment and thought content normal.  Extrems:  Neg edema bilat. Neg Homan's bilat  EFM: Baseline 135 with moderate variability. 15 x 15bpm Accels present.  No decels present.  Category 1. Toco:  No UCs noted. SVE:  Closed/thick/posterior/vtx/ballotable    Results for orders placed during the hospital encounter of 01/11/11 (from the past 24 hour(s))  URINALYSIS, ROUTINE W REFLEX MICROSCOPIC     Status: Abnormal   Collection Time   01/11/11  5:50 PM      Component Value Range   Color, Urine YELLOW  YELLOW    Appearance HAZY (*) CLEAR    Specific Gravity, Urine >1.030 (*) 1.005 - 1.030    pH 6.0  5.0 - 8.0    Glucose, UA NEGATIVE  NEGATIVE (mg/dL)   Hgb urine dipstick LARGE (*) NEGATIVE    Bilirubin Urine NEGATIVE  NEGATIVE    Ketones, ur NEGATIVE  NEGATIVE (mg/dL)   Protein, ur 409 (*) NEGATIVE (mg/dL)   Urobilinogen, UA 0.2  0.0 - 1.0 (mg/dL)   Nitrite NEGATIVE  NEGATIVE    Leukocytes, UA NEGATIVE  NEGATIVE   URINE MICROSCOPIC-ADD ON     Status: Abnormal   Collection Time   01/11/11  5:50 PM      Component Value Range   Squamous Epithelial / LPF FEW (*) RARE    WBC, UA 3-6  <3 (WBC/hpf)   RBC / HPF TOO NUMEROUS TO COUNT  <3 (RBC/hpf)   Bacteria, UA FEW (*) RARE   CBC     Status: Abnormal   Collection Time   01/11/11  7:35 PM      Component Value Range   WBC 14.9 (*) 4.0 - 10.5 (K/uL)   RBC 3.94  3.87 - 5.11 (MIL/uL)   Hemoglobin 11.8 (*) 12.0 - 15.0 (g/dL)   HCT 81.1 (*) 91.4 - 46.0 (%)   MCV 87.8  78.0 - 100.0 (fL)   MCH 29.9  26.0 - 34.0 (pg)   MCHC 34.1  30.0 - 36.0 (g/dL)   RDW 78.2  95.6 - 21.3 (%)   Platelets 231  150 - 400 (K/uL)  DIFFERENTIAL     Status: Abnormal     Collection Time   01/11/11  7:35 PM      Component Value Range   Neutrophils Relative 72  43 - 77 (%)   Neutro Abs 10.7 (*) 1.7 -  7.7 (K/uL)   Lymphocytes Relative 20  12 - 46 (%)   Lymphs Abs 3.0  0.7 - 4.0 (K/uL)   Monocytes Relative 7  3 - 12 (%)   Monocytes Absolute 1.1 (*) 0.1 - 1.0 (K/uL)   Eosinophils Relative 1  0 - 5 (%)   Eosinophils Absolute 0.1  0.0 - 0.7 (K/uL)   Basophils Relative 0  0 - 1 (%)   Basophils Absolute 0.0  0.0 - 0.1 (K/uL)    MAU Course  Procedures EFM Urinalysis Labs  Assessment and Plan  IUP at 28.1wks Lt flank pain History of renal lithiasis Hematuria  Consult with Dr. Su Hilt. Renal ultrasound ordered however due to requirement of being NPO for 8hrs, pt requests diagnostic imaging be performed as an outpt and refuses to stay at present.  Will schedule renal ultrasound ASAP as outpatient.  Instructed pt in NPO for 8hrs prior to performance of renal ultrasound.   Continue with prn pain medications.  Pt declines prescription for pain medications at the present time.  States she will use previously prescribed Vicodin that she has on hand.  Recommend 1-2 tabs po every 6-8hrs as needed for severe pain. Prescription for macrobid given and pt to use 1 cap po bid x 7 days.   Urine sent for C&S.   Reviewed signs/symptoms of preterm labor and fetal kick counts Reviewed warning signs/symptoms. Follow-up with [facility] as scheduled on Monday, 01/13/11. Pt instructed to call [facility] on Monday, 01/13/11, to schedule an appointment to be seen ASAP.  Tauno Falotico O. 01/11/2011, 8:10 PM

## 2011-01-13 ENCOUNTER — Other Ambulatory Visit: Payer: Self-pay | Admitting: Obstetrics and Gynecology

## 2011-01-13 LAB — URINE CULTURE
Colony Count: NO GROWTH
Culture: NO GROWTH

## 2011-02-03 MED ORDER — CEFAZOLIN SODIUM 1 G IJ SOLR: 2.0000 g | Freq: Once | INTRAMUSCULAR | Status: DC

## 2011-02-08 ENCOUNTER — Inpatient Hospital Stay (HOSPITAL_COMMUNITY)
Admission: AD | Admit: 2011-02-08 | Discharge: 2011-02-12 | DRG: 781 | Disposition: A | Discharge status: Home / Self Care | Payer: 59 | Source: Ambulatory Visit | Attending: Obstetrics and Gynecology | Admitting: Obstetrics and Gynecology

## 2011-02-08 DIAGNOSIS — I10 Essential (primary) hypertension: Secondary | ICD-10-CM

## 2011-02-08 DIAGNOSIS — Z3490 Encounter for supervision of normal pregnancy, unspecified, unspecified trimester: Secondary | ICD-10-CM

## 2011-02-08 DIAGNOSIS — Z331 Pregnant state, incidental: Secondary | ICD-10-CM

## 2011-02-08 DIAGNOSIS — F329 Major depressive disorder, single episode, unspecified: Secondary | ICD-10-CM

## 2011-02-08 DIAGNOSIS — N209 Urinary calculus, unspecified: Secondary | ICD-10-CM | POA: Diagnosis present

## 2011-02-08 DIAGNOSIS — N912 Amenorrhea, unspecified: Secondary | ICD-10-CM

## 2011-02-08 DIAGNOSIS — R1032 Left lower quadrant pain: Secondary | ICD-10-CM | POA: Diagnosis present

## 2011-02-08 DIAGNOSIS — O26839 Pregnancy related renal disease, unspecified trimester: Principal | ICD-10-CM | POA: Diagnosis present

## 2011-02-08 DIAGNOSIS — K5900 Constipation, unspecified: Secondary | ICD-10-CM | POA: Diagnosis present

## 2011-02-08 DIAGNOSIS — N1330 Unspecified hydronephrosis: Secondary | ICD-10-CM | POA: Diagnosis present

## 2011-02-08 DIAGNOSIS — E039 Hypothyroidism, unspecified: Secondary | ICD-10-CM

## 2011-02-08 DIAGNOSIS — O210 Mild hyperemesis gravidarum: Secondary | ICD-10-CM

## 2011-02-08 DIAGNOSIS — R109 Unspecified abdominal pain: Secondary | ICD-10-CM

## 2011-02-08 DIAGNOSIS — E669 Obesity, unspecified: Secondary | ICD-10-CM

## 2011-02-08 HISTORY — DX: Anemia, unspecified: D64.9

## 2011-02-08 HISTORY — DX: Gestational (pregnancy-induced) hypertension without significant proteinuria, unspecified trimester: O13.9

## 2011-02-09 ENCOUNTER — Encounter (HOSPITAL_COMMUNITY): Payer: Self-pay | Admitting: *Deleted

## 2011-02-09 ENCOUNTER — Inpatient Hospital Stay (HOSPITAL_COMMUNITY): Payer: 59

## 2011-02-09 LAB — URINE MICROSCOPIC-ADD ON

## 2011-02-09 LAB — BASIC METABOLIC PANEL
Calcium: 9.1 mg/dL (ref 8.4–10.5)
GFR calc Af Amer: >60 mL/min (ref >60)
GFR calc non Af Amer: >60 mL/min (ref >60)
Glucose, Bld: 82 mg/dL (ref 70–99)
Potassium: 3.5 mEq/L (ref 3.5–5.1)
Sodium: 134 mEq/L — ABNORMAL LOW (ref 135–145)

## 2011-02-09 LAB — CBC
Hemoglobin: 11.9 g/dL — ABNORMAL LOW (ref 12.0–15.0)
MCH: 30.2 pg (ref 26.0–34.0)
Platelets: 194 10*3/uL (ref 150–400)
RBC: 3.94 MIL/uL (ref 3.87–5.11)
WBC: 13.1 10*3/uL — ABNORMAL HIGH (ref 4.0–10.5)

## 2011-02-09 LAB — URINALYSIS, ROUTINE W REFLEX MICROSCOPIC
Bilirubin Urine: NEGATIVE
Glucose, UA: NEGATIVE mg/dL
Specific Gravity, Urine: 1.025 (ref 1.005–1.030)
Urobilinogen, UA: 0.2 mg/dL (ref 0.0–1.0)

## 2011-02-09 IMAGING — US US RENAL
1 series · 14 of 25 positions shown · non-contrast
Comparison: None.

CLINICAL DATA: Left flank pain, history of kidney stones, 32 weeks
pregnant.

RENAL/URINARY TRACT ULTRASOUND COMPLETE

[Series 1: us renal · 14 of 49 slices shown]
[im 1/49]
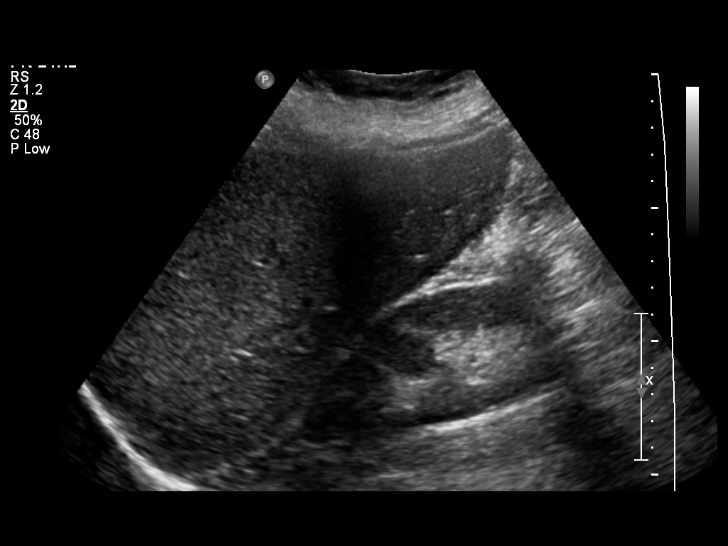
[im 5/49]
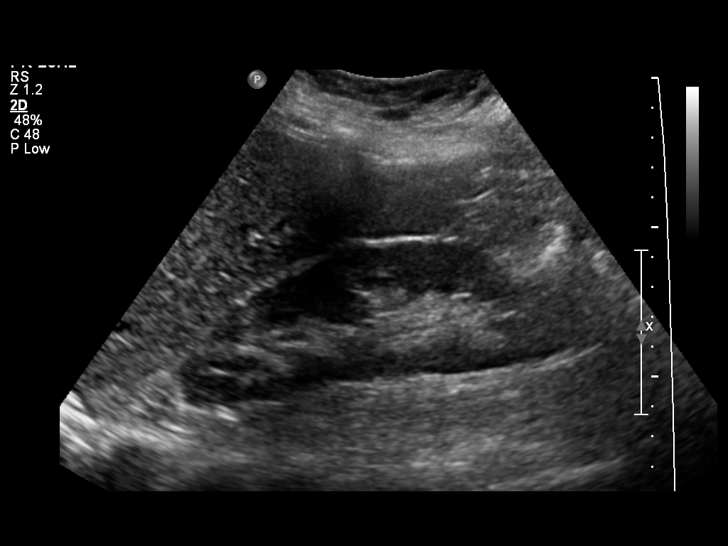
[im 9/49]
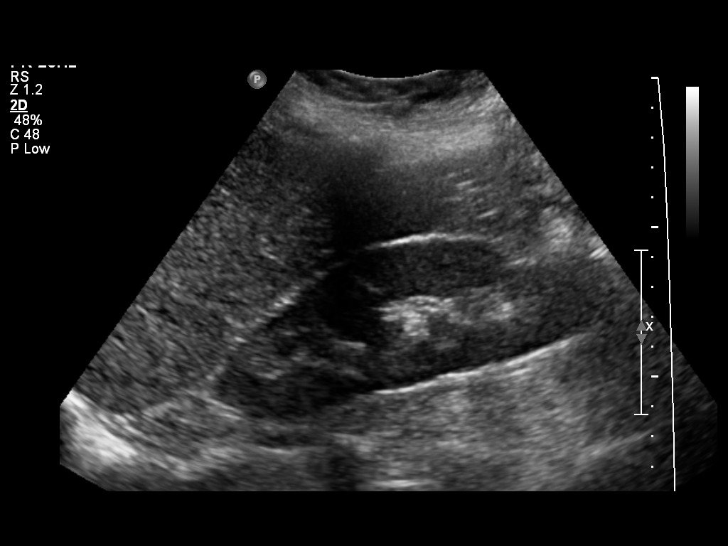
[im 13/49]
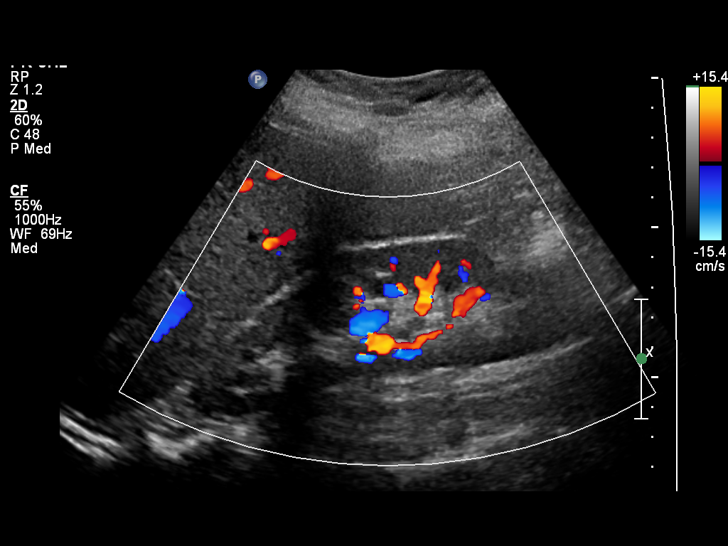
[im 17/49]
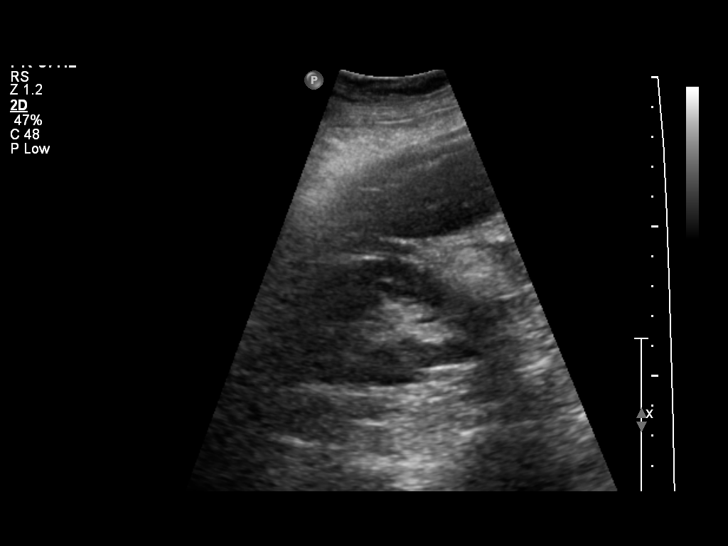
[im 19/49]
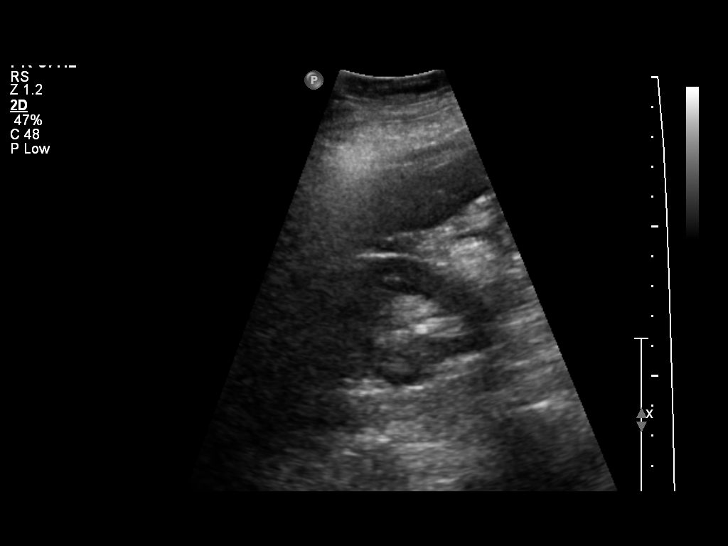
[im 23/49]
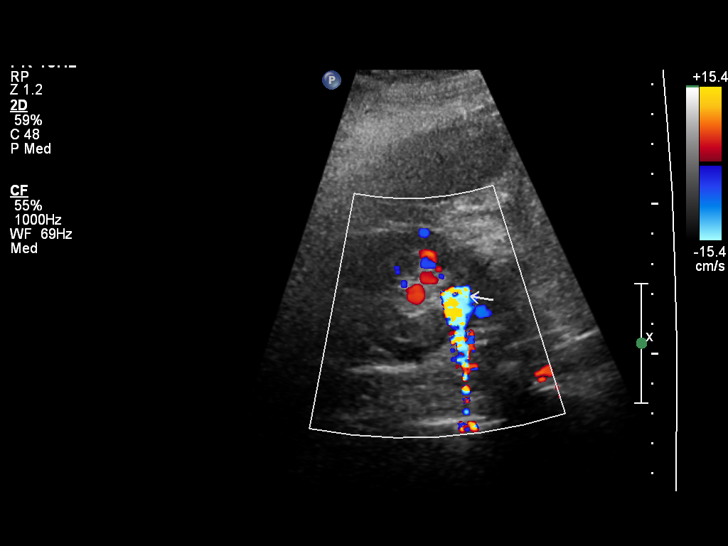
[im 27/49]
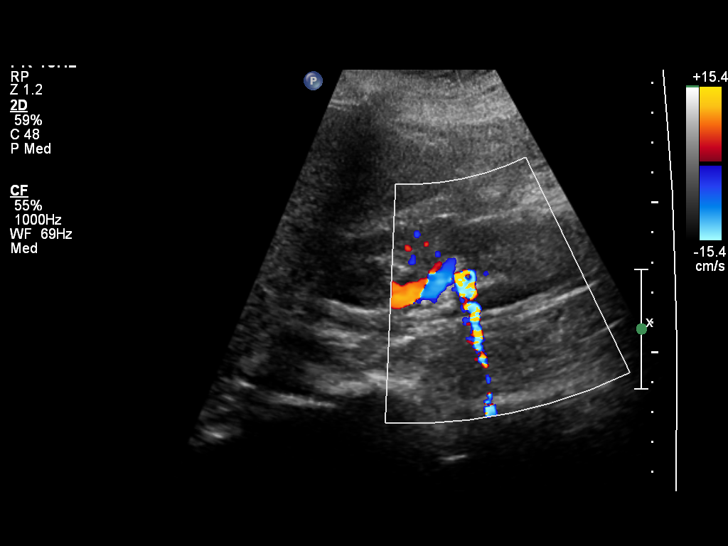
[im 31/49]
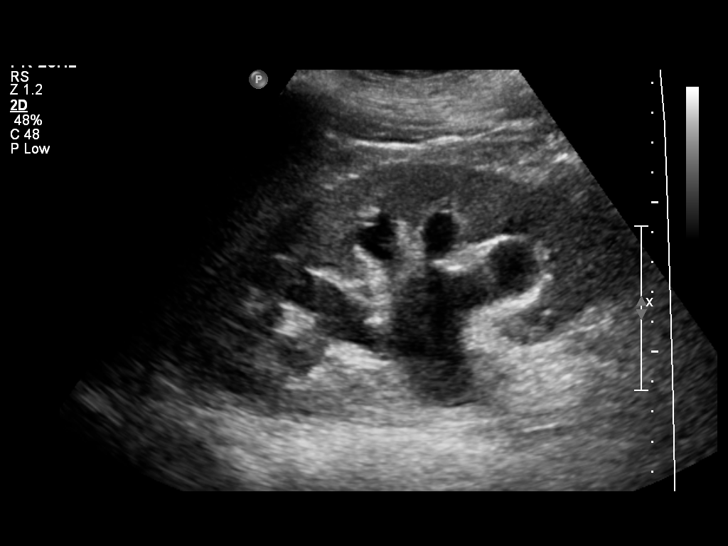
[im 33/49]
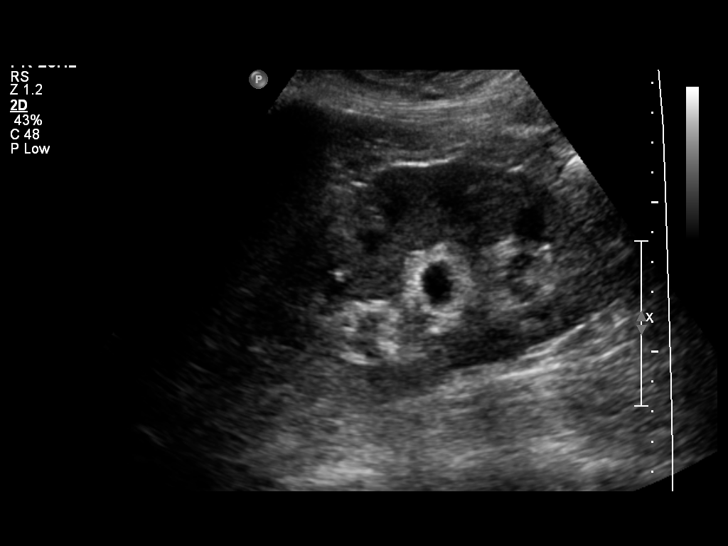
[im 37/49]
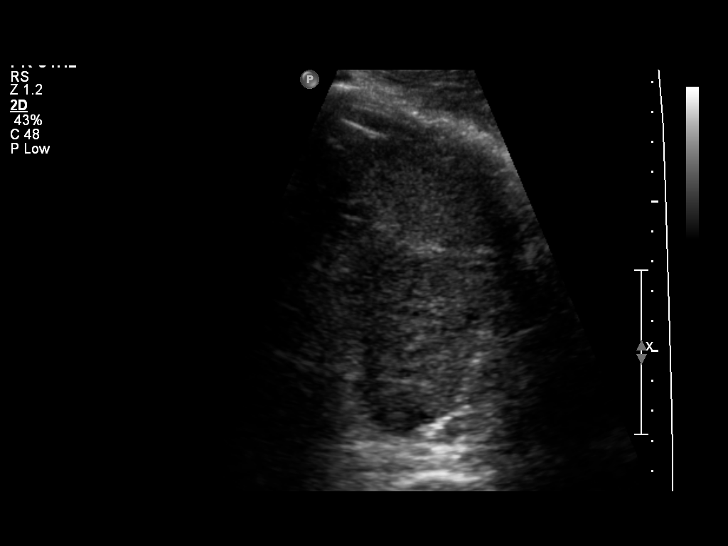
[im 41/49]
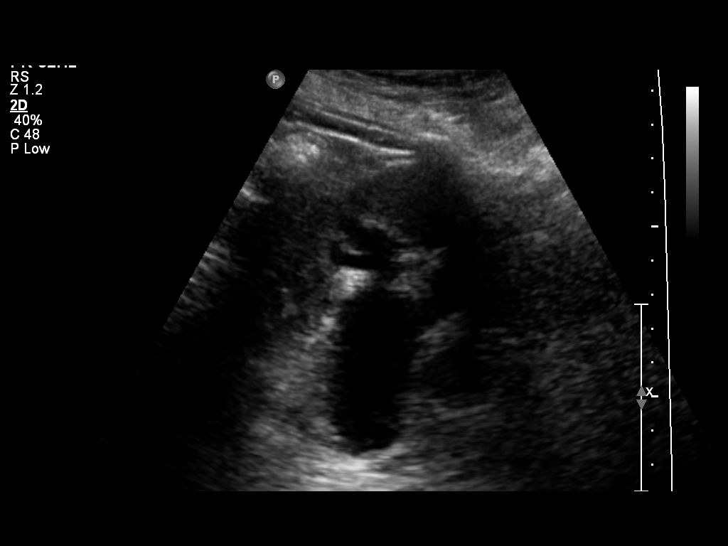
[im 45/49]
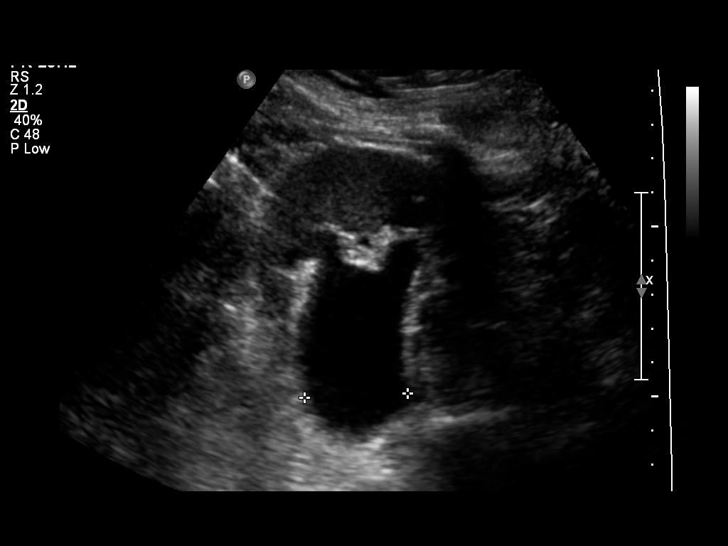
[im 49/49]
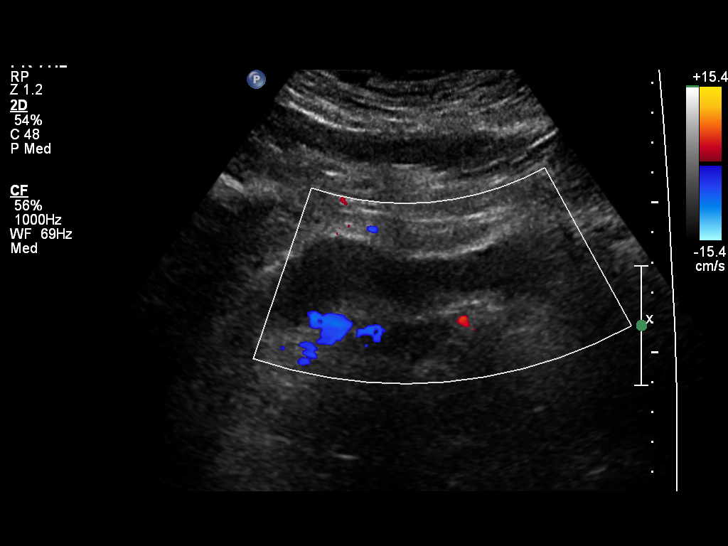

[14 of 25 positions shown; findings below may reference images not displayed]

FINDINGS: Right Kidney:  Measures 13.5 cm.  No hydronephrosis or focal
lesion.  Normal echogenicity.  There is a 4 mm nonobstructing stone
within the lower pole.  In addition, there is a 5 mm stone within
the interpolar/lower pole.

Left Kidney:  Moderate hydronephrosis and proximal hydroureter on
the left.  The kidney measures 15.4 cm in diameter.

Bladder:  The bladder is partially decompressed.
IMPRESSION: Moderate hydroureteronephrosis on the left.

Nonobstructing right renal calculi.

## 2011-02-09 MED ORDER — BUTORPHANOL TARTRATE 2 MG/ML IJ SOLN
1.0000 mg | INTRAMUSCULAR | Status: DC | PRN
  Administered 2011-02-09: 1 mg via INTRAVENOUS
  Filled 2011-02-09: qty 1

## 2011-02-09 MED ORDER — CEFAZOLIN SODIUM 1-5 GM-% IV SOLN
1.0000 g | Freq: Three times a day (TID) | INTRAVENOUS | Status: DC
  Administered 2011-02-09 – 2011-02-12 (×9): 1 g via INTRAVENOUS
  Filled 2011-02-09 (×10): qty 50

## 2011-02-09 MED ORDER — HYDROMORPHONE HCL 1 MG/ML IJ SOLN
2.0000 mg | INTRAMUSCULAR | Status: DC | PRN
  Administered 2011-02-09 – 2011-02-11 (×11): 2 mg via INTRAVENOUS
  Filled 2011-02-09 (×2): qty 1
  Filled 2011-02-09 (×2): qty 2
  Filled 2011-02-09: qty 1
  Filled 2011-02-09 (×4): qty 2
  Filled 2011-02-09 (×2): qty 1
  Filled 2011-02-09 (×2): qty 2
  Filled 2011-02-09: qty 1

## 2011-02-09 MED ORDER — LACTATED RINGERS IV SOLN
INTRAVENOUS | Status: DC
  Administered 2011-02-09 – 2011-02-12 (×14): via INTRAVENOUS

## 2011-02-09 MED ORDER — HYDROMORPHONE HCL 1 MG/ML IJ SOLN
2.0000 mg | INTRAMUSCULAR | Status: DC | PRN
  Administered 2011-02-09 (×4): 2 mg via INTRAVENOUS
  Filled 2011-02-09: qty 2
  Filled 2011-02-09: qty 1
  Filled 2011-02-09: qty 2
  Filled 2011-02-09 (×2): qty 1
  Filled 2011-02-09: qty 2

## 2011-02-09 MED ORDER — ONDANSETRON HCL 4 MG/2ML IJ SOLN
4.0000 mg | Freq: Four times a day (QID) | INTRAMUSCULAR | Status: DC | PRN
  Administered 2011-02-09: 4 mg via INTRAVENOUS
  Filled 2011-02-09: qty 2

## 2011-02-09 MED ORDER — ACETAMINOPHEN 325 MG PO TABS: 650.0000 mg | ORAL_TABLET | ORAL | Status: DC | PRN

## 2011-02-09 MED ORDER — PRENATAL PLUS 27-1 MG PO TABS
1.0000 | ORAL_TABLET | Freq: Every day | ORAL | Status: DC
  Administered 2011-02-10 – 2011-02-11 (×2): 1 via ORAL
  Filled 2011-02-09 (×3): qty 1

## 2011-02-09 MED ORDER — CALCIUM CARBONATE ANTACID 500 MG PO CHEW: 2.0000 | CHEWABLE_TABLET | ORAL | Status: DC | PRN

## 2011-02-09 MED ORDER — DOCUSATE SODIUM 100 MG PO CAPS
100.0000 mg | ORAL_CAPSULE | Freq: Every day | ORAL | Status: DC
  Administered 2011-02-10 – 2011-02-11 (×2): 100 mg via ORAL
  Filled 2011-02-09 (×3): qty 1

## 2011-02-09 MED ORDER — VENLAFAXINE HCL ER 75 MG PO CP24
75.0000 mg | ORAL_CAPSULE | Freq: Every day | ORAL | Status: DC
  Administered 2011-02-09 – 2011-02-11 (×3): 75 mg via ORAL
  Filled 2011-02-09 (×4): qty 1

## 2011-02-09 MED ORDER — PROMETHAZINE HCL 25 MG/ML IJ SOLN
12.5000 mg | Freq: Four times a day (QID) | INTRAMUSCULAR | Status: DC | PRN
  Administered 2011-02-09 – 2011-02-11 (×5): 12.5 mg via INTRAVENOUS
  Filled 2011-02-09 (×5): qty 1

## 2011-02-09 MED ORDER — ZOLPIDEM TARTRATE 10 MG PO TABS: 10.0000 mg | ORAL_TABLET | Freq: Every evening | ORAL | Status: DC | PRN

## 2011-02-09 MED ORDER — BUTORPHANOL TARTRATE 2 MG/ML IJ SOLN
1.0000 mg | INTRAMUSCULAR | Status: DC | PRN
  Administered 2011-02-09 (×3): 1 mg via INTRAVENOUS
  Filled 2011-02-09 (×4): qty 1

## 2011-02-09 NOTE — Progress Notes (Signed)
Informed N. Katrinka Blazing, CNM of U/S results regarding right kidney stones.

## 2011-02-09 NOTE — Progress Notes (Signed)
  S: Resting comfortably. States Stadol is wearing off after about an hour and pain in back is severe until next dose Denies UC's, cramping Reports GFM, no VD, LOF  O: BP 137/81  Pulse 74  Temp(Src) 97.8 F (36.6 C) (Oral)  Resp 20  Ht 5\' 3"  (1.6 m)  Wt 127.098 kg (280 lb 3.2 oz)  BMI 49.64 kg/m2  LMP 06/27/2010   ZOX:[id number]: 130 bpm, Variability: Good {> 6 bpm), Accelerations: Reactive and Decelerations: Absent Toco: None SVE: Deferred  A/P- 25 y.o. admitted with Renal Calculi and Hydronephrosis Preterm labor management: no treatment necessary Dating:  [redacted]w[redacted]d  A: Renal Calculi Hydronephrosis Admitted for Pain management  Plan: Change IV pain med to Dilaudid 2 mg q 3 hrs prn pain Strain all urine MD to follow

## 2011-02-09 NOTE — Progress Notes (Signed)
Elsie Ra CNM in to see pt and discuss plan of care. OK to d/c efm for now until can get pt more comfortable. EFM strip reviewed. Pt sitting up on bedside and vomited small amt.

## 2011-02-09 NOTE — H&P (Signed)
Chief Complaint  Patient presents with  . Flank Pain   HPI   [name] [name] is a 25yo G2 P1,0,0,1 who presents to MAU at 32w 2d gestation with c/o sudden onset of lt sided flank pain this evening after standing up.  Pt states she has used Vicodin x 1 tab without relief.  She states the pain is constant with sharp exacerbations which are accompanied by nausea.  She denies vomiting.  She reports some dysuria earlier in the evening but that it has resolved at the present time.  She denies any fever.  She ranks the constant pain as 8/10 and the exacerbations as 10/10.  She reports no relief with the vicodin.  She has not treated her pain with any other medications.  She states she has tried using a heating pad as well as a warm bath without relief. The pain is also unchanged with position changes.  Pt was evaluated in MAU on 7/28 with similar complaints and at that time pt declined to stay for renal ultrasound.  Pt has not had a renal ultrasound during this pregnancy.  Urine culture done at her last visit with no growth.  Pt states that her pain resolved shortly after her last MAU visit and that it has not recurred until today and she denies vicodin use until this evening.  She has not had a urological evaluation during this pregnancy and states she just did not get the renal ultrasound performed on an outpatient basis.  She denies any headache, vision chgs, RUQ pain, increased edema or toxic s/s.  She reports her fetus is moving normally.  She denies UCs, ROM or bldg.   Pt is followed by the CNM service of [facility] and entered prenatal care at [redacted]wks gestation.  Her prenatal course has otherwise been uncomplicated. Quad screen negative.  1hr Glucola-99.  OB History    Grav Para Term Preterm Abortions TAB SAB Ect Mult Living   2 1 1  0 0 0 0 0 0 1      Past Medical History  Diagnosis Date  . HYPOTHYROIDISM 04/05/2010  . OBESITY 04/05/2010  . DEPRESSION 04/10/2010  . HYPERTENSION 04/05/2010  .  Anxiety   . Hypothyroidism   . Infertility, female   . Depression   . Kidney stones     Past Surgical History  Procedure Date  . Cesarean section   . Tonsillectomy   . Wisdom tooth extraction     Family History  Problem Relation Age of Onset  . Diabetes Other   . Hyperlipidemia Other   . Hypertension Other   . Hypertension Mother   . Hyperlipidemia Father   . Hypertension Father   . Kidney disease Father   . Hypertension Maternal Grandmother   . Diabetes Maternal Grandfather     History  Substance Use Topics  . Smoking status: Never Smoker   . Smokeless tobacco: Never Used  . Alcohol Use: No    Allergies: No Known Allergies  Prescriptions prior to admission  Medication Sig Dispense Refill  . HYDROcodone-acetaminophen (VICODIN) 5-500 MG per tablet Take 1 tablet by mouth every 6 (six) hours as needed. For pain.       . prenatal vitamin w/FE, FA (PRENATAL 1 + 1) 27-1 MG TABS Take 1 tablet by mouth daily.        Marland Kitchen venlafaxine (EFFEXOR-XR) 75 MG 24 hr capsule TAKE 1 CAPSULE BY MOUTH ONCE DAILY  30 capsule  3  . Prenatal Vit-Fe Psac Cmplx-FA (PRENATAL  MULTIVITAMIN) 60-1 MG tablet Take 1 tablet by mouth daily.          Review of Systems  Constitutional: Negative.   HENT: Negative.   Eyes: Negative.   Respiratory: Negative.   Cardiovascular: Negative.   Gastrointestinal: Negative.   Genitourinary: Positive for flank pain.  Musculoskeletal: Negative.   Skin: Negative.   Neurological: Negative.   Endo/Heme/Allergies: Negative.   Psychiatric/Behavioral: Negative.    Physical Exam   Blood pressure 135/83, pulse 104, temperature 97.6 F (36.4 C), temperature source Oral, resp. rate 20, height 5\' 3"  (1.6 m), weight 127.098 kg (280 lb 3.2 oz), last menstrual period 06/27/2010.  Physical Exam  Constitutional: She is oriented to person, place, and time. She appears well-developed and well-nourished.  HENT:  Head: Normocephalic and atraumatic.  Right Ear: External ear  normal.  Left Ear: External ear normal.  Nose: Nose normal.  Eyes: Pupils are equal, round, and reactive to light.  Neck: Normal range of motion. Neck supple. No thyromegaly present.  Cardiovascular: Normal rate, regular rhythm, normal heart sounds and intact distal pulses.        Neg edema  Respiratory: Effort normal and breath sounds normal.  GI: Soft. Bowel sounds are normal.  Genitourinary: Uterus normal.  Musculoskeletal: Normal range of motion.       Neg CVAT bilaterally.  Neurological: She is alert and oriented to person, place, and time. She has normal reflexes.  Skin: Skin is warm and dry.  Psychiatric: She has a normal mood and affect. Her behavior is normal. Judgment and thought content normal.   FHR baseline 125 with moderate variability present.  No decels present.  10 x 10 Accels present.  FHR Cat II No UCs noted on toco.   SVE-deferred  Results for orders placed during the hospital encounter of 02/08/11 (from the past 24 hour(s))  URINALYSIS, ROUTINE W REFLEX MICROSCOPIC     Status: Abnormal   Collection Time   02/09/11 12:00 AM      Component Value Range   Color, Urine YELLOW  YELLOW    Appearance CLEAR  CLEAR    Specific Gravity, Urine 1.025  1.005 - 1.030    pH 6.0  5.0 - 8.0    Glucose, UA NEGATIVE  NEGATIVE (mg/dL)   Hgb urine dipstick LARGE (*) NEGATIVE    Bilirubin Urine NEGATIVE  NEGATIVE    Ketones, ur NEGATIVE  NEGATIVE (mg/dL)   Protein, ur NEGATIVE  NEGATIVE (mg/dL)   Urobilinogen, UA 0.2  0.0 - 1.0 (mg/dL)   Nitrite NEGATIVE  NEGATIVE    Leukocytes, UA MODERATE (*) NEGATIVE   URINE MICROSCOPIC-ADD ON     Status: Abnormal   Collection Time   02/09/11 12:00 AM      Component Value Range   Squamous Epithelial / LPF FEW (*) RARE    WBC, UA 3-6  <3 (WBC/hpf)   RBC / HPF 11-20  <3 (RBC/hpf)   Bacteria, UA MANY (*) RARE     MAU Course  Procedures Renal ultrasound I Assessment and Plan  IUP at 32w 2d Left flank pain Hx renal lithiasis  Consult  obtained with Dr. Stefano Gaul. Pt to be admitted to Antepartum for 23hr observation for renal ultrasound, IV hydration and pain control. BMP and CBC on admission

## 2011-02-09 NOTE — Progress Notes (Signed)
S: More comfortable with Dilaudid. Still hurting. O: BP 115/72  Pulse 85  Temp(Src) 97.5 F (36.4 C) (Oral)  Resp 20  Ht 5\' 3"  (1.6 m)  Wt 127.098 kg (280 lb 3.2 oz)  BMI 49.64 kg/m2  LMP 06/27/2010 ABD: Nontender NST: Cat 1 A: ABD pain. Presumed kidney stone.  P: Continue to strain her urine. Continue pain meds. Continue antibiotics. Check Urine Culture.

## 2011-02-09 NOTE — ED Notes (Signed)
Pt taken to u/s at 0215 via w/c. Will go to Rm 156 from u/s. Kim RN in antenatal given report.

## 2011-02-09 NOTE — ED Notes (Signed)
Nona Smith CNM notified of pt's admission and status. Will see pt 

## 2011-02-09 NOTE — Progress Notes (Signed)
G2P1 at 32.3 wks with hx kidney stones. Had pain with stone on L 3 wks ago and having same type pain now. Took Vicodin at 1900 without relief.

## 2011-02-09 NOTE — Progress Notes (Signed)
Patient interviewed and examined. FHT: Cat 1 Will continue hydration and pain meds. Will begin Ancef.

## 2011-02-09 NOTE — ED Provider Notes (Signed)
History   Chief Complaint  Patient presents with  . Flank Pain   HPI   [name] [name] is a 25yo G2 P1,0,0,1 who presents to MAU at 32w 2d gestation with c/o sudden onset of lt sided flank pain this evening after standing up.  Pt states she has used Vicodin x 1 tab without relief.  She states the pain is constant with sharp exacerbations which are accompanied by nausea.  She denies vomiting.  She reports some dysuria earlier in the evening but that it has resolved at the present time.  She denies any fever.  She ranks the constant pain as 8/10 and the exacerbations as 10/10.  She reports no relief with the vicodin.  She has not treated her pain with any other medications.  She states she has tried using a heating pad as well as a warm bath without relief. The pain is also unchanged with position changes.  Pt was evaluated in MAU on 7/28 with similar complaints and at that time pt declined to stay for renal ultrasound.  Pt has not had a renal ultrasound during this pregnancy.  Urine culture done at her last visit with no growth.  Pt states that her pain resolved shortly after her last MAU visit and that it has not recurred until today and she denies vicodin use until this evening.  She has not had a urological evaluation during this pregnancy and states she just did not get the renal ultrasound performed on an outpatient basis.  She denies any headache, vision chgs, RUQ pain, increased edema or toxic s/s.  She reports her fetus is moving normally.  She denies UCs, ROM or bldg.    OB History    Grav Para Term Preterm Abortions TAB SAB Ect Mult Living   2 1 1  0 0 0 0 0 0 1      Past Medical History  Diagnosis Date  . HYPOTHYROIDISM 04/05/2010  . OBESITY 04/05/2010  . DEPRESSION 04/10/2010  . HYPERTENSION 04/05/2010  . Anxiety   . Hypothyroidism   . Infertility, female   . Depression   . Kidney stones     Past Surgical History  Procedure Date  . Cesarean section   . Tonsillectomy   .  Wisdom tooth extraction     Family History  Problem Relation Age of Onset  . Diabetes Other   . Hyperlipidemia Other   . Hypertension Other   . Hypertension Mother   . Hyperlipidemia Father   . Hypertension Father   . Kidney disease Father   . Hypertension Maternal Grandmother   . Diabetes Maternal Grandfather     History  Substance Use Topics  . Smoking status: Never Smoker   . Smokeless tobacco: Never Used  . Alcohol Use: No    Allergies: No Known Allergies  Prescriptions prior to admission  Medication Sig Dispense Refill  . HYDROcodone-acetaminophen (VICODIN) 5-500 MG per tablet Take 1 tablet by mouth every 6 (six) hours as needed. For pain.       . prenatal vitamin w/FE, FA (PRENATAL 1 + 1) 27-1 MG TABS Take 1 tablet by mouth daily.        Marland Kitchen venlafaxine (EFFEXOR-XR) 75 MG 24 hr capsule TAKE 1 CAPSULE BY MOUTH ONCE DAILY  30 capsule  3  . Prenatal Vit-Fe Psac Cmplx-FA (PRENATAL MULTIVITAMIN) 60-1 MG tablet Take 1 tablet by mouth daily.          Review of Systems  Constitutional: Negative.   HENT:  Negative.   Eyes: Negative.   Respiratory: Negative.   Cardiovascular: Negative.   Gastrointestinal: Negative.   Genitourinary: Positive for flank pain.  Musculoskeletal: Negative.   Skin: Negative.   Neurological: Negative.   Endo/Heme/Allergies: Negative.   Psychiatric/Behavioral: Negative.    Physical Exam   Blood pressure 135/83, pulse 104, temperature 97.6 F (36.4 C), temperature source Oral, resp. rate 20, height 5\' 3"  (1.6 m), weight 127.098 kg (280 lb 3.2 oz), last menstrual period 06/27/2010.  Physical Exam  Constitutional: She is oriented to person, place, and time. She appears well-developed and well-nourished.  HENT:  Head: Normocephalic and atraumatic.  Right Ear: External ear normal.  Left Ear: External ear normal.  Nose: Nose normal.  Eyes: Pupils are equal, round, and reactive to light.  Neck: Normal range of motion. Neck supple. No thyromegaly  present.  Cardiovascular: Normal rate, regular rhythm, normal heart sounds and intact distal pulses.        Neg edema  Respiratory: Effort normal and breath sounds normal.  GI: Soft. Bowel sounds are normal.  Genitourinary: Uterus normal.  Musculoskeletal: Normal range of motion.       Neg CVAT bilaterally.  Neurological: She is alert and oriented to person, place, and time. She has normal reflexes.  Skin: Skin is warm and dry.  Psychiatric: She has a normal mood and affect. Her behavior is normal. Judgment and thought content normal.   FHR baseline 125 with moderate variability present.  No decels present.  Accels present.  FHR Cat I No UCs noted on toco.    Results for orders placed during the hospital encounter of 02/08/11 (from the past 24 hour(s))  URINALYSIS, ROUTINE W REFLEX MICROSCOPIC     Status: Abnormal   Collection Time   02/09/11 12:00 AM      Component Value Range   Color, Urine YELLOW  YELLOW    Appearance CLEAR  CLEAR    Specific Gravity, Urine 1.025  1.005 - 1.030    pH 6.0  5.0 - 8.0    Glucose, UA NEGATIVE  NEGATIVE (mg/dL)   Hgb urine dipstick LARGE (*) NEGATIVE    Bilirubin Urine NEGATIVE  NEGATIVE    Ketones, ur NEGATIVE  NEGATIVE (mg/dL)   Protein, ur NEGATIVE  NEGATIVE (mg/dL)   Urobilinogen, UA 0.2  0.0 - 1.0 (mg/dL)   Nitrite NEGATIVE  NEGATIVE    Leukocytes, UA MODERATE (*) NEGATIVE   URINE MICROSCOPIC-ADD ON     Status: Abnormal   Collection Time   02/09/11 12:00 AM      Component Value Range   Squamous Epithelial / LPF FEW (*) RARE    WBC, UA 3-6  <3 (WBC/hpf)   RBC / HPF 11-20  <3 (RBC/hpf)   Bacteria, UA MANY (*) RARE    MAU Course  Procedures Renal ultrasound I Assessment and Plan  IUP at 32w 2d Left flank pain Hx renal lithiasis  Consult obtained with Dr. Stefano Gaul. Pt to be admitted to Antepartum for 23hr observation for renal ultrasound, IV hydration and pain control. BMP and CBC on admission    [place] Landgrebe O. 02/09/2011, 12:54 AM  .Criss Rosales

## 2011-02-10 DIAGNOSIS — Z3490 Encounter for supervision of normal pregnancy, unspecified, unspecified trimester: Secondary | ICD-10-CM

## 2011-02-10 NOTE — Progress Notes (Signed)
Rn to the bedside to adjust [place] and Toco; FHR - 130's-140's; pt. Sitting in HF position eating breakfast.

## 2011-02-10 NOTE — Progress Notes (Signed)
UR chart review completed.  

## 2011-02-10 NOTE — Progress Notes (Signed)
Pt. Up to BR for void.  

## 2011-02-10 NOTE — Progress Notes (Signed)
RN to the bedside to adjust EFM & Toco, FHR - 140's.

## 2011-02-10 NOTE — Progress Notes (Signed)
TED hose on, bilateral lower extremities.

## 2011-02-10 NOTE — Progress Notes (Signed)
Monitor picking up maternal HR, cardio adjusted.

## 2011-02-10 NOTE — Progress Notes (Signed)
Just reviewed plan of care with pt and husband. Questions answered.

## 2011-02-10 NOTE — Progress Notes (Signed)
   Hospital day # 2 pregnancy at [redacted]w[redacted]d  Ancef # 1  S: reports good fetal activity      Left flank / LLQ unchanged. Using Dilaudid every 2 hours for some relief: pain decreased to 4/10 from 10/10.      Also C/O nausea without vomiting.      Contractions:none      Vaginal bleeding:none now       Vaginal discharge: no significant change  O: BP 141/85  Pulse 109  Temp(Src) 98.4 F (36.9 C) (Oral)  Resp 20  Ht 5\' 3"  (1.6 m)  Wt 127.098 kg (280 lb 3.2 oz)  BMI 49.64 kg/m2  LMP 06/27/2010      Fetal tracings:reviewed and reassuring      Uterus gravid and non-tender      Extremities: no significant edema and no signs of DVT      + left CVAT  A: [redacted]w[redacted]d with probable left urolithiasis     unchanged  P: continue current plan of care     Will increase IV perfusion and optimize Nausea management     Urine culture still pending  Autry Prust A  MD 02/10/2011 11:59 AM

## 2011-02-11 LAB — URINE CULTURE
Colony Count: 2000
Culture  Setup Time: 201208261050
Special Requests: NORMAL

## 2011-02-11 MED ORDER — ONDANSETRON HCL 4 MG PO TABS
4.0000 mg | ORAL_TABLET | Freq: Three times a day (TID) | ORAL | Status: DC | PRN
  Administered 2011-02-11 – 2011-02-12 (×2): 4 mg via ORAL
  Filled 2011-02-11 (×3): qty 1

## 2011-02-11 MED ORDER — HYDROMORPHONE HCL 2 MG PO TABS
2.0000 mg | ORAL_TABLET | Freq: Four times a day (QID) | ORAL | Status: DC | PRN
  Administered 2011-02-11 – 2011-02-12 (×3): 2 mg via ORAL
  Filled 2011-02-11 (×3): qty 1

## 2011-02-11 MED ORDER — PROMETHAZINE HCL 25 MG PO TABS: 25.0000 mg | ORAL_TABLET | Freq: Four times a day (QID) | ORAL | Status: DC | PRN

## 2011-02-11 MED ORDER — CYCLOBENZAPRINE HCL 10 MG PO TABS
10.0000 mg | ORAL_TABLET | Freq: Three times a day (TID) | ORAL | Status: DC
  Administered 2011-02-11 – 2011-02-12 (×4): 10 mg via ORAL
  Filled 2011-02-11 (×4): qty 1

## 2011-02-11 NOTE — Progress Notes (Signed)
UR completed 

## 2011-02-11 NOTE — Progress Notes (Signed)
JADYN BRASHER is a 25 y.o. G2P1001 at [redacted]w[redacted]d by LMP admitted for left flank pain and history of renal stones.  Subjective: GI: nausea without vomitting GU: Denies: dysuria, frequency/urgency, hematuria, vaginal bleeding, Denies: Denies: pelvic pain.  Does have continued LLQ and flank pain. OB: good fetal movement        Objective: BP 122/63  Pulse 105  Temp(Src) 97.9 F (36.6 C) (Oral)  Resp 20  Ht 5\' 3"  (1.6 m)  Wt 280 lb 3.2 oz (127.098 kg)  BMI 49.64 kg/m2  SpO2 97%  LMP 06/27/2010      FHT:  FHR: 140 bpm, variability: moderate,  accelerations:  Present,  decelerations:  Absent UC:   none SVE:      Labs: Lab Results  Component Value Date   WBC 13.1* 02/09/2011   HGB 11.9* 02/09/2011   HCT 34.7* 02/09/2011   MCV 88.1 02/09/2011   PLT 194 02/09/2011  [place]:  Right renal and ureteral calculi.  Left hydronephrosis Urine C&S:  pending Assessment / Plan: IUP at 32 weeks and 4 days Urolithiasis LLQ and flank pain, question etiology  Fetal Wellbeing:  Category II Plan:  Continue hydration and antibiotics            Begin Flexeril            Change to oral analgesics.            Increase activity            Possible DC home on 02/12/11   Maurizio Geno P 02/11/2011, 11:16 AM

## 2011-02-12 MED ORDER — HYDROCODONE-ACETAMINOPHEN 5-500 MG PO TABS: 1.0000 | ORAL_TABLET | ORAL | Status: AC | PRN

## 2011-02-12 MED ORDER — MAGNESIUM HYDROXIDE 400 MG/5ML PO SUSP: 15.0000 mL | Freq: Every day | ORAL | Status: AC | PRN

## 2011-02-12 MED ORDER — DISPOSABLE ENEMA 19-7 GM/118ML RE ENEM: 1.0000 | ENEMA | Freq: Once | RECTAL | Status: AC

## 2011-02-12 MED ORDER — CYCLOBENZAPRINE HCL 10 MG PO TABS: 10.0000 mg | ORAL_TABLET | Freq: Three times a day (TID) | ORAL | Status: AC

## 2011-02-12 NOTE — Discharge Summary (Signed)
Physician Discharge Summary  Patient ID: [name] [name] MRN: [id number] DOB/AGE: 1986/04/16 25 y.o.  Admit date: 02/08/2011 Discharge date: 02/12/2011  Admission Diagnoses: IUP at 32.2 weeks Left flank pain   Discharge Diagnoses:  Principal Problem:  *Urolithiasis Active Problems:  Normal pregnancy Constipation  Discharged Condition: stable  Hospital Course: Patient presented on 02/08/11 with report of worsening left flank pain, and urine showed large Hgb.  She was admitted for IV hydration and pain management. She was placed on Ancef, and Dilaudid IV was given for pain.  Patient remained afebrile throughout her hospital stay.  Her WBC ct was 13.1.  US showed right renal and ureteral calculi and moderate left hydroureteronephrosis.   By 8/28, she was converted to po pain medications and Flexeril.  Urine culture showed +GBS.  FHR remained reassuring, with reactive cycles during her hospitalization. She did have constipation noted.  By 02/12/11, she was seen by Dr. Stefano Gaul and deemed to have received the full benefit of her hospital stay and discharged home in stable condition.  Consults: none  Significant Diagnostic Studies: labs: Urine culture +GBS; CBC WNL.  US showed right renal and ureteral calculi, non-obstructing; left hydroureteronephrosis.  Treatments: IV hydration, Antibiotics, Pain medication  Discharge Exam: Blood pressure 117/69, pulse 91, temperature 97.5 F (36.4 C), temperature source Oral, resp. rate 20, height 5\' 3"  (1.6 m), weight 130.636 kg (288 lb), last menstrual period 06/27/2010, SpO2 98.00%. General appearance: alert Physical exam WNL. Still has mild constipation, but bowel sounds present in all quadrants.   Abd:  Gravid, NT No CVAT noted (just slight soreness of back).  Disposition: Home or Self Care Rx Vicodin, Flexeril Constipation measures   Discharge Orders    Future Appointments: Provider: Department: Dept Phone: Center:   03/14/2011 1:00 PM  Rene Paci, MD [facility] [phone] [facility]     Future Orders Please Complete By Expires   PRETERM LABOR:  Includes any of the follwing symptoms that occur between 20 - [redacted] weeks gestation.  If these symptoms are not stopped, preterm labor can result in preterm delivery, placing your baby at risk      Notify physician for menstrual like cramps      Notify physician for uterine contractions.  These may be painless and feel like the uterus is tightening or the baby is  "balling up"      Notify physician for low, dull backache, unrelieved by heat or Tylenol      Notify physician for intestinal cramps, with or without diarrhea, sometimes described as "gas pain"      Notify physician for pelvic pressure      Notify physician for increase or change in vaginal discharge      Notify physician for vaginal bleeding      Notify physician for a general feeling that "something is not right"      Notify physician for leaking of fluid      Discharge activity:  No Restrictions      Discharge diet:  No restrictions      No sexual activity restrictions        Current Discharge Medication List    START taking these medications   Details  cyclobenzaprine (FLEXERIL) 10 MG tablet Take 1 tablet (10 mg total) by mouth 3 (three) times daily. Qty: 30 tablet, Refills: 1    !! HYDROcodone-acetaminophen (VICODIN) 5-500 MG per tablet Take 1-2 tablets by mouth every 4 (four) hours as needed for pain. Qty: 50 tablet, Refills: 1  magnesium hydroxide (MILK OF MAGNESIA) 400 MG/5ML suspension Take 15 mLs by mouth daily as needed for constipation (Patient wants to try prune juice first.). Qty: 360 mL, Refills: 0    sodium phosphate (FLEET) enema Place 1 enema rectally once. follow package directions Qty: 135 mL, Refills: 0     !! - Potential duplicate medications found. Please discuss with provider.    CONTINUE these medications which have NOT CHANGED   Details  !! HYDROcodone-acetaminophen (VICODIN) 5-500 MG  per tablet Take 1 tablet by mouth every 6 (six) hours as needed. For pain.     prenatal vitamin w/FE, FA (PRENATAL 1 + 1) 27-1 MG TABS Take 1 tablet by mouth daily.      venlafaxine (EFFEXOR-XR) 75 MG 24 hr capsule TAKE 1 CAPSULE BY MOUTH ONCE DAILY Qty: 30 capsule, Refills: 3    Prenatal Vit-Fe Psac Cmplx-FA (PRENATAL MULTIVITAMIN) 60-1 MG tablet Take 1 tablet by mouth daily.       !! - Potential duplicate medications found. Please discuss with provider.     Follow-up Information    Follow up with [facility] in 1 week.         Signed: Achille Xiang L 02/12/2011, 11:28 AM

## 2011-02-12 NOTE — Progress Notes (Signed)
S:  Doing better today. Pain meds working better. Complains of constipation. No BM in four days. O: BP 117/69  Pulse 91  Temp(Src) 97.5 F (36.4 C) (Oral)  Resp 20  Ht 5\' 3"  (1.6 m)  Wt 130.636 kg (288 lb)  BMI 51.02 kg/m2  SpO2 98%  LMP 06/27/2010 FHT: Cat 1 Chest: Clear Heart: RRR ABD: Gravid, non-tender, no guarding or rebound Urine Culture: 20,000 colonies of Beta Strep A: Improved     Beta Strep in the Urine     Constipation P: Will discharge today with Vicodin, Flexeril, and PNV.  Prune juice or enema for constipation. RTO in one week. Call if pain gets worse.

## 2011-03-14 ENCOUNTER — Ambulatory Visit: Payer: 59 | Admitting: Internal Medicine

## 2011-03-17 LAB — URINALYSIS, ROUTINE W REFLEX MICROSCOPIC
Bilirubin Urine: NEGATIVE
Nitrite: NEGATIVE
Specific Gravity, Urine: 1.02
pH: 6

## 2011-03-17 LAB — URINE MICROSCOPIC-ADD ON

## 2011-03-19 ENCOUNTER — Encounter (HOSPITAL_COMMUNITY)
Admission: RE | Admit: 2011-03-19 | Discharge: 2011-03-19 | Disposition: A | Discharge status: Home / Self Care | Payer: 59 | Source: Ambulatory Visit | Attending: Obstetrics and Gynecology | Admitting: Obstetrics and Gynecology

## 2011-03-19 ENCOUNTER — Encounter: Payer: Self-pay | Admitting: Obstetrics and Gynecology

## 2011-03-19 ENCOUNTER — Inpatient Hospital Stay (HOSPITAL_COMMUNITY)
Admission: AD | Admit: 2011-03-19 | Discharge: 2011-03-19 | Disposition: A | Discharge status: Home Health | Payer: 59 | Source: Ambulatory Visit | Attending: Obstetrics and Gynecology | Admitting: Obstetrics and Gynecology

## 2011-03-19 ENCOUNTER — Encounter (HOSPITAL_COMMUNITY): Payer: Self-pay

## 2011-03-19 ENCOUNTER — Encounter (HOSPITAL_COMMUNITY): Payer: Self-pay | Admitting: *Deleted

## 2011-03-19 DIAGNOSIS — O139 Gestational [pregnancy-induced] hypertension without significant proteinuria, unspecified trimester: Secondary | ICD-10-CM | POA: Insufficient documentation

## 2011-03-19 HISTORY — DX: Gastro-esophageal reflux disease without esophagitis: K21.9

## 2011-03-19 LAB — URINALYSIS, ROUTINE W REFLEX MICROSCOPIC
Bilirubin Urine: NEGATIVE
Glucose, UA: NEGATIVE mg/dL
Ketones, ur: 15 mg/dL — AB
Nitrite: NEGATIVE
Specific Gravity, Urine: 1.02 (ref 1.005–1.030)
pH: 7 (ref 5.0–8.0)

## 2011-03-19 LAB — CBC
Platelets: 188 10*3/uL (ref 150–400)
RBC: 3.9 MIL/uL (ref 3.87–5.11)
RDW: 14.8 % (ref 11.5–15.5)
WBC: 11.1 10*3/uL — ABNORMAL HIGH (ref 4.0–10.5)

## 2011-03-19 LAB — COMPREHENSIVE METABOLIC PANEL
ALT: 7 U/L (ref 0–35)
AST: 11 U/L (ref 0–37)
Albumin: 2.6 g/dL — ABNORMAL LOW (ref 3.5–5.2)
CO2: 26 mEq/L (ref 19–32)
Chloride: 104 mEq/L (ref 96–112)
GFR calc non Af Amer: >90 mL/min (ref >90)
Potassium: 4.1 mEq/L (ref 3.5–5.1)
Sodium: 137 mEq/L (ref 135–145)
Total Bilirubin: 0.3 mg/dL (ref 0.3–1.2)

## 2011-03-19 LAB — URINE MICROSCOPIC-ADD ON

## 2011-03-19 LAB — SURGICAL PCR SCREEN
MRSA, PCR: NEGATIVE
Staphylococcus aureus: NEGATIVE

## 2011-03-19 LAB — URIC ACID: Uric Acid, Serum: 4.9 mg/dL (ref 2.4–7.0)

## 2011-03-19 MED ORDER — NITROFURANTOIN MONOHYD MACRO 100 MG PO CAPS: 100.0000 mg | ORAL_CAPSULE | Freq: Two times a day (BID) | ORAL | Status: DC

## 2011-03-19 MED ORDER — NITROFURANTOIN MONOHYD MACRO 100 MG PO CAPS: 100.0000 mg | ORAL_CAPSULE | Freq: Two times a day (BID) | ORAL | Status: AC

## 2011-03-19 NOTE — Patient Instructions (Signed)
   Your procedure is scheduled on:03/27/11  Enter through the Main Entrance of [facility] [phone] Pick up the phone at the desk and dial [phone] and inform us of your arrival  Please call this number if you have any problems the morning of surgery: [phone]  Remember: Do not eat food after midnight  Do not drink clear liquids after:midnite Take these medicines the morning of surgery with a SIP OF WATER:none  Do not wear jewelry, make-up, or FINGER nail polish Do not wear lotions, powders, or perfumes.  You may wear deodorant. Do not shave 48 hours prior to surgery. Do not bring valuables to the hospital. Leave suitcase in the car. After Surgery it may be brought to your room. For patients being admitted to the hospital, checkout time is 11:00am the day of discharge.  Patients discharged on the day of surgery will not be allowed to drive home.   Name and phone number of your driver: Wes- [phone]   Remember to use your hibiclens as instructed.Please shower with 1/2 bottle the evening before your surgery and the other 1/2 bottle the morning of surgery.

## 2011-03-19 NOTE — ED Provider Notes (Signed)
History     Chief Complaint  Patient presents with  . Hypertension   HPI Comments: Pt is a 24yo G2P1001 at 37.5 wks. She was here having her pre-op appointment and her BP was notably elevated, so pt was sent to [facility] for eval. Pt denies any ctx, VB or LOF, reports +FM. Pt denies HA/N/V/ epigastric pain or blurry vision.  Pregnancy significant for: 1. Prev c/s 2. Hx NTD 3. Obesity 4. Hypothyroidism 5. Depression/anxiety 6. Hx infertility/clomid 3 cycles 7. Hx kidney stones 8. Hx PIH vs. CHTN   Hypertension This is a new problem. The current episode started today. Pertinent negatives include no blurred vision, chest pain, headaches, malaise/fatigue, peripheral edema or shortness of breath. Past treatments include nothing.      Past Medical History  Diagnosis Date  . HYPOTHYROIDISM 04/05/2010  . OBESITY 04/05/2010  . DEPRESSION 04/10/2010  . Anxiety   . Infertility, female   . HYPERTENSION 04/05/2010    PIH with last preg  . Pregnancy induced hypertension   . Anemia     after 1st delivery  . Kidney stones     recurrent  . GERD (gastroesophageal reflux disease)     pregnancy related- no meds    Past Surgical History  Procedure Date  . Cesarean section   . Tonsillectomy   . Wisdom tooth extraction     Family History  Problem Relation Age of Onset  . Diabetes Other   . Hyperlipidemia Other   . Hypertension Other   . Hypertension Mother   . Hyperlipidemia Father   . Hypertension Father   . Kidney disease Father   . Cancer Father   . Hypertension Maternal Grandmother   . Diabetes Maternal Grandfather     History  Substance Use Topics  . Smoking status: Never Smoker   . Smokeless tobacco: Never Used  . Alcohol Use: No    Allergies: No Known Allergies  Prescriptions prior to admission  Medication Sig Dispense Refill  . Prenatal Vit-Fe Psac Cmplx-FA (PRENATAL MULTIVITAMIN) 60-1 MG tablet Take 1 tablet by mouth daily.        . prenatal vitamin w/FE, FA  (PRENATAL 1 + 1) 27-1 MG TABS Take 1 tablet by mouth daily.        Marland Kitchen venlafaxine (EFFEXOR-XR) 75 MG 24 hr capsule TAKE 1 CAPSULE BY MOUTH ONCE DAILY  30 capsule  3  . HYDROcodone-acetaminophen (VICODIN) 5-500 MG per tablet Take 1 tablet by mouth every 6 (six) hours as needed. For pain.         Review of Systems  Constitutional: Negative for malaise/fatigue.  Eyes: Negative for blurred vision.  Respiratory: Negative for shortness of breath.   Cardiovascular: Negative for chest pain.  Gastrointestinal: Negative for nausea, vomiting and abdominal pain.  Neurological: Negative for headaches.  Psychiatric/Behavioral: Negative for depression and suicidal ideas. The patient is not nervous/anxious.   All other systems reviewed and are negative.   Physical Exam   Blood pressure 134/87, pulse 103, temperature 98 F (36.7 C), temperature source Oral, resp. rate 18, height 5\' 3"  (1.6 m), weight 126.724 kg (279 lb 6 oz), last menstrual period 06/27/2010.  Physical Exam  Constitutional: She is oriented to person, place, and time. She appears well-developed and well-nourished.  Cardiovascular: Normal rate.   Respiratory: Effort normal.  GI: Soft.  Musculoskeletal: Normal range of motion. She exhibits no edema.  Neurological: She is alert and oriented to person, place, and time. She has normal reflexes.  Skin: Skin  is warm and dry.  Psychiatric: She has a normal mood and affect. Her behavior is normal.   FHR 130 CAT I toco quiet Pelvic deferred  Filed Vitals:   03/19/11 1148 03/19/11 1213  BP: 143/103 134/87  Pulse: 96 103  Temp: 98.5 F (36.9 C) 98 F (36.7 C)  TempSrc: Oral Oral  Resp: 18 18  Height: 5\' 3"  (1.6 m)   Weight: 126.724 kg (279 lb 6 oz)     MAU Course  Procedures    Assessment and Plan  A: G2P1 at 37.5 wks sched for C/S next week HTN vs. PIH Stable P: PIH labs and urine Serial BP's Will discuss with Dr. Hansel Starling M 03/19/2011, 12:58 PM

## 2011-03-19 NOTE — ED Provider Notes (Signed)
Filed Vitals:   03/19/11 1148 03/19/11 1213 03/19/11 1321  BP: 143/103 134/87 140/85  Pulse: 96 103 89  Temp: 98.5 F (36.9 C) 98 F (36.7 C)   TempSrc: Oral Oral   Resp: 18 18 18   Height: 5\' 3"  (1.6 m)    Weight: 126.724 kg (279 lb 6 oz)     Results for orders placed during the hospital encounter of 03/19/11 (from the past 24 hour(s))  COMPREHENSIVE METABOLIC PANEL     Status: Abnormal   Collection Time   03/19/11 11:26 AM      Component Value Range   Sodium 137  135 - 145 (mEq/L)   Potassium 4.1  3.5 - 5.1 (mEq/L)   Chloride 104  96 - 112 (mEq/L)   CO2 26  19 - 32 (mEq/L)   Glucose, Bld 72  70 - 99 (mg/dL)   BUN 7  6 - 23 (mg/dL)   Creatinine, Ser 1.61  0.50 - 1.10 (mg/dL)   Calcium 9.0  8.4 - 09.6 (mg/dL)   Total Protein 6.0  6.0 - 8.3 (g/dL)   Albumin 2.6 (*) 3.5 - 5.2 (g/dL)   AST 11  0 - 37 (U/L)   ALT 7  0 - 35 (U/L)   Alkaline Phosphatase 201 (*) 39 - 117 (U/L)   Total Bilirubin 0.3  0.3 - 1.2 (mg/dL)   GFR calc non Af Amer >90  >90 (mL/min)   GFR calc Af Amer >90  >90 (mL/min)  LACTATE DEHYDROGENASE     Status: Normal   Collection Time   03/19/11 11:26 AM      Component Value Range   LD 136  94 - 250 (U/L)  URIC ACID     Status: Normal   Collection Time   03/19/11 11:26 AM      Component Value Range   Uric Acid, Serum 4.9  2.4 - 7.0 (mg/dL)  URINALYSIS, ROUTINE W REFLEX MICROSCOPIC     Status: Abnormal   Collection Time   03/19/11 11:55 AM      Component Value Range   Color, Urine YELLOW  YELLOW    Appearance HAZY (*) CLEAR    Specific Gravity, Urine 1.020  1.005 - 1.030    pH 7.0  5.0 - 8.0    Glucose, UA NEGATIVE  NEGATIVE (mg/dL)   Hgb urine dipstick LARGE (*) NEGATIVE    Bilirubin Urine NEGATIVE  NEGATIVE    Ketones, ur 15 (*) NEGATIVE (mg/dL)   Protein, ur NEGATIVE  NEGATIVE (mg/dL)   Urobilinogen, UA 0.2  0.0 - 1.0 (mg/dL)   Nitrite NEGATIVE  NEGATIVE    Leukocytes, UA LARGE (*) NEGATIVE   URINE MICROSCOPIC-ADD ON     Status: Abnormal   Collection Time   03/19/11 11:55 AM      Component Value Range   Squamous Epithelial / LPF MANY (*) RARE    WBC, UA 21-50  <3 (WBC/hpf)   RBC / HPF 21-50  <3 (RBC/hpf)   Bacteria, UA MANY (*) RARE    Urine-Other MUCOUS PRESENT      PIH labs normal Probable UTI  D/C home with RX for macrobid And 24hr urine collection  D/W Dr. Estanislado Pandy

## 2011-03-19 NOTE — Progress Notes (Signed)
Pt states, " I was in pro-op reguarding my planned C/S next week, and my BP was up three times. I had a headache yesterday but none today."

## 2011-03-25 LAB — POCT PREGNANCY, URINE
Operator id: 272551
Preg Test, Ur: NEGATIVE

## 2011-03-26 MED ORDER — CEFAZOLIN SODIUM-DEXTROSE 2-3 GM-% IV SOLR
2.0000 g | INTRAVENOUS | Status: AC
  Administered 2011-03-27: 2 g via INTRAVENOUS
  Filled 2011-03-26: qty 50

## 2011-03-26 NOTE — H&P (Signed)
[name] [name], ANTUNES            ACCOUNT NO.:  [id number]  MEDICAL RECORD NO.:  [id number]  LOCATION:                                 FACILITY:  PHYSICIAN:  Naima A. Dillard, M.D. DATE OF BIRTH:  25-Jan-1986  DATE OF ADMISSION: DATE OF DISCHARGE:                             HISTORY & PHYSICAL   [name] is a 25 year old gravida 2, para 1-0-0-1 who presents on the day of admission on March 27, 2011 for scheduled repeat cesarean section and tubal sterilization.  Her pregnancy has been remarkable for 1. Previous cesarean section with desire for repeat and desire for     tubal. 2. History of PIH versus chronic hypertension, with no significant     issues with hypertension at this time. 3. Hypothyroidism. 4. Depression and anxiety. 5. History of infertility. 6. History of kidney stones. 7. Family history of neural tube defect. 8. Obesity. 9. Positive group B strep. 10.Rh negative.  PRENATAL LABS:  Blood type is O negative, Rh antibody negative, VDRL nonreactive, rubella titer positive, hepatitis B surface antigen negative, HIV was nonreactive.  Hemoglobin upon entering the practice was 12.5.  Pap was normal in July 2011.  GC and Chlamydia cultures were declined.  Vitamin D level was 20.  The patient had a first trimester screen that was normal.  AFP was normal.  She had an early Glucola that was also normal at 92.  She had a followup Glucola at 28 weeks that was normal.  Hemoglobin at that time was 11.9, RPR was nonreactive.  Group B strep culture was positive in the third trimester.  The patient had a 24- hour urine done on March 20, 2011, that showed 247 mg of protein in a 24-hour specimen.  She also had thyroid studies done during her pregnancy that were essentially within normal limits.  HISTORY OF PRESENT PREGNANCY:  The patient entered care at 9 weeks.  Her husband was Rh negative and did not require a RhoGAM.  Vitamin D was low and she was supplemented with 4000  international units.  She had a first trimester screen that was normal.  She had a tick bite at 17 weeks, but never developed any rash.  AFP was normal.  She had an ultrasound at 20 weeks for anatomy that showed normal growth and development.  By that time, she was planning a repeat C-section with Dr. Normand Sloop.  She also had an early Glucola that was normal.  As her pregnancy progress, she began to consider whether she was interested in doing a trial of labor; however, eventually she elected to proceed with C-section and tubal sterilization.  She was placed on Effexor during her pregnancy at 75 mg. Thyroid panel was done at 26 weeks, which was normal.  The patient did have an experienced of kidney stones at 28 weeks when was in the hospital.  Plan was made to follow up after her pregnancy with her primary for further treatment of her kidney stones.  At 34 weeks, she was sure that she wanted a tubal and this was planned.  She had an ultrasound at 36 weeks showing growth within normal limits at 6 pounds and 5 ounces with a normal  BPP.  Group B strep culture was positive at 36 weeks.  She continued to measure size greater than dates.  She had a 24-hour urine done at 38-39 weeks secondary to some mild elevation of her blood pressure.  A 24-hour urine showed 247 mg of protein.  She was then scheduled for C-section on March 27, 2011.  OBSTETRICAL HISTORY:  In February 2011, she had a primary low-transverse cesarean section for a female infant, weight 7 pounds and 6 ounces at 39 and 3/7th weeks.  She was induced for PIH.  She had failure to progress. She progressed to 5 to 6 cm.  She did have to be on Clomid for 3 months prior to that conception; however, her current pregnancy was a spontaneous pregnancy.  She continued to have some postpartum anemia after her first pregnancy.  She was on labetalol on the third trimester with that pregnancy, but never had preeclampsia.  She was also  positive for group B strep with that pregnancy.  MEDICAL HISTORY:  She was a previous NuvaRing, oral contraceptive, and Seasonale user.  She did have the Gardasil series.  She reports usual childhood illnesses.  Did have a history of hypothyroidism in the past. She also had occasional UTIs.  She has had a history of kidney stones in the past both during pregnancy and between.  She also has a history of some sciatica.  She has no known medication allergies.  FAMILY HISTORY:  Her parents and maternal grandmother have elevated blood pressure.  Her father has elevated cholesterol.  Maternal grandmother has anemia.  Her maternal aunt is hyperthyroid.  Her mother has seizures as a child and after having the patient at birth.  Her father has kidney stones.  Father of baby's nephew was born with a hole in his heart and the patient's first or second cousin has spina bifida.  SOCIAL HISTORY:  The patient is married to the father of baby, he is involved and supportive, his name is Jaliya Siegmann.  The patient has a bachelor's degree.  She is a Administrator, sports.  Her husband has high school education.  He is a Hospital doctor.  The patient is Caucasian.  She denies religious affiliation.  She has been followed by the [facility] in consultation with Dr. Normand Sloop as her primary. The patient denies any alcohol, drug, or tobacco use prior to or during her pregnancy.  PAST SURGICAL HISTORY:  Tonsillectomy at age 49, wisdom teeth removed in 2001, and a previously noted C-section in February 2011.  GENETIC HISTORY:  Remarkable for the patient's first or second cousin having spina bifida.  PHYSICAL EXAMINATION:  VITAL SIGNS:  Stable.  The patient is febrile. HEENT:  Within normal limits. LUNGS:  Her Breath sounds are clear. HEART:  Regular rate and rhythm without murmur.  Breasts are soft and nontender. ABDOMEN:  Fundal height is approximately 40-41 cm, estimated fetal weight 7-8  pounds.  Uterine contractions are very occasional and mild, weight at her last visit was 283 pounds on March 24, 2011. PELVIC:  Deferred. EXTREMITIES:  Deep tendon reflexes are 2+ without clonus.  There is 1+ edema noted.  IMPRESSION: 1. Intrauterine pregnancy at 33 and 3/7th weeks. 2. Previous cesarean section with desire for repeat and desire for     tubal. 3. Positive group B Streptococcus. 4. Hyperthyroidism. 5. History of depression and anxiety with the patient on Effexor 75 mg     p.o. daily. 6. History of kidney stones. 7. Elevated BMI.  PLAN: 1. Admit to [facility] [place] per consult with Dr. Jaymes Graff as attending physician. 2. Routine physician preoperative orders.     Renaldo Reel Emilee Hero, C.N.M.   ______________________________ Pierre Bali Normand Sloop, M.D.    Leeanne Mannan  D:  03/25/2011  T:  03/25/2011  Job:  [id number]

## 2011-03-27 ENCOUNTER — Inpatient Hospital Stay (HOSPITAL_COMMUNITY): Admit: 2011-03-27 | Payer: 59 | Admitting: Obstetrics and Gynecology

## 2011-03-27 ENCOUNTER — Encounter (HOSPITAL_COMMUNITY): Payer: Self-pay | Admitting: Surgery

## 2011-03-27 ENCOUNTER — Inpatient Hospital Stay (HOSPITAL_COMMUNITY)
Admission: RE | Admit: 2011-03-27 | Discharge: 2011-03-30 | DRG: 766 | Disposition: A | Discharge status: Home / Self Care | Payer: 59 | Source: Ambulatory Visit | Attending: Obstetrics and Gynecology | Admitting: Obstetrics and Gynecology

## 2011-03-27 ENCOUNTER — Encounter (HOSPITAL_COMMUNITY): Payer: Self-pay

## 2011-03-27 ENCOUNTER — Encounter (HOSPITAL_COMMUNITY): Payer: Self-pay | Admitting: Anesthesiology

## 2011-03-27 ENCOUNTER — Encounter (HOSPITAL_COMMUNITY)
Admission: RE | Disposition: A | Discharge status: Home / Self Care | Payer: Self-pay | Source: Ambulatory Visit | Attending: Obstetrics and Gynecology

## 2011-03-27 ENCOUNTER — Inpatient Hospital Stay (HOSPITAL_COMMUNITY): Payer: 59 | Admitting: Anesthesiology

## 2011-03-27 ENCOUNTER — Other Ambulatory Visit: Payer: Self-pay | Admitting: Obstetrics and Gynecology

## 2011-03-27 DIAGNOSIS — Z98891 History of uterine scar from previous surgery: Secondary | ICD-10-CM

## 2011-03-27 DIAGNOSIS — O328XX0 Maternal care for other malpresentation of fetus, not applicable or unspecified: Secondary | ICD-10-CM | POA: Diagnosis present

## 2011-03-27 DIAGNOSIS — Z302 Encounter for sterilization: Secondary | ICD-10-CM

## 2011-03-27 DIAGNOSIS — Z01812 Encounter for preprocedural laboratory examination: Secondary | ICD-10-CM

## 2011-03-27 DIAGNOSIS — Z9851 Tubal ligation status: Secondary | ICD-10-CM

## 2011-03-27 DIAGNOSIS — O34219 Maternal care for unspecified type scar from previous cesarean delivery: Principal | ICD-10-CM | POA: Diagnosis present

## 2011-03-27 DIAGNOSIS — Z01818 Encounter for other preprocedural examination: Secondary | ICD-10-CM

## 2011-03-27 SURGERY — Surgical Case
Anesthesia: Spinal | Site: Abdomen | Laterality: Bilateral | Wound class: Clean Contaminated

## 2011-03-27 SURGERY — Surgical Case
Anesthesia: Spinal | Laterality: Bilateral

## 2011-03-27 MED ORDER — ONDANSETRON HCL 4 MG/2ML IJ SOLN
INTRAMUSCULAR | Status: AC
  Filled 2011-03-27: qty 2

## 2011-03-27 MED ORDER — NALBUPHINE HCL 10 MG/ML IJ SOLN
5.0000 mg | INTRAMUSCULAR | Status: DC | PRN
  Filled 2011-03-27: qty 1

## 2011-03-27 MED ORDER — ZOLPIDEM TARTRATE 5 MG PO TABS: 5.0000 mg | ORAL_TABLET | Freq: Every evening | ORAL | Status: DC | PRN

## 2011-03-27 MED ORDER — METOCLOPRAMIDE HCL 5 MG/ML IJ SOLN: 10.0000 mg | Freq: Three times a day (TID) | INTRAMUSCULAR | Status: DC | PRN

## 2011-03-27 MED ORDER — EPHEDRINE 5 MG/ML INJ
INTRAVENOUS | Status: AC
  Filled 2011-03-27: qty 10

## 2011-03-27 MED ORDER — PHENYLEPHRINE HCL 10 MG/ML IJ SOLN
INTRAMUSCULAR | Status: DC | PRN
  Administered 2011-03-27: 120 ug via INTRAVENOUS
  Administered 2011-03-27 (×2): 80 ug via INTRAVENOUS
  Administered 2011-03-27: 240 ug via INTRAVENOUS
  Administered 2011-03-27: 40 ug via INTRAVENOUS
  Administered 2011-03-27: 120 ug via INTRAVENOUS

## 2011-03-27 MED ORDER — DIPHENHYDRAMINE HCL 25 MG PO CAPS: 25.0000 mg | ORAL_CAPSULE | Freq: Four times a day (QID) | ORAL | Status: DC | PRN

## 2011-03-27 MED ORDER — ONDANSETRON HCL 4 MG PO TABS: 4.0000 mg | ORAL_TABLET | ORAL | Status: DC | PRN

## 2011-03-27 MED ORDER — MENTHOL 3 MG MT LOZG
1.0000 | LOZENGE | OROMUCOSAL | Status: DC | PRN
Start: 2011-03-27 — End: 2011-03-30

## 2011-03-27 MED ORDER — OXYCODONE-ACETAMINOPHEN 5-325 MG PO TABS
1.0000 | ORAL_TABLET | ORAL | Status: DC | PRN
  Administered 2011-03-27 – 2011-03-28 (×3): 1 via ORAL
  Administered 2011-03-28 (×2): 2 via ORAL
  Administered 2011-03-29 – 2011-03-30 (×3): 1 via ORAL
  Administered 2011-03-30: 2 via ORAL
  Filled 2011-03-27 (×2): qty 1
  Filled 2011-03-27 (×2): qty 2
  Filled 2011-03-27 (×5): qty 1
  Filled 2011-03-27: qty 2

## 2011-03-27 MED ORDER — ONDANSETRON HCL 4 MG/2ML IJ SOLN: 4.0000 mg | INTRAMUSCULAR | Status: DC | PRN

## 2011-03-27 MED ORDER — SCOPOLAMINE 1 MG/3DAYS TD PT72
1.0000 | MEDICATED_PATCH | Freq: Once | TRANSDERMAL | Status: DC
  Filled 2011-03-27: qty 1

## 2011-03-27 MED ORDER — WITCH HAZEL-GLYCERIN EX PADS: 1.0000 "application " | MEDICATED_PAD | CUTANEOUS | Status: DC | PRN

## 2011-03-27 MED ORDER — TETANUS-DIPHTH-ACELL PERTUSSIS 5-2.5-18.5 LF-MCG/0.5 IM SUSP: 0.5000 mL | Freq: Once | INTRAMUSCULAR | Status: DC

## 2011-03-27 MED ORDER — DIPHENHYDRAMINE HCL 50 MG/ML IJ SOLN
12.5000 mg | INTRAMUSCULAR | Status: DC | PRN
  Administered 2011-03-27: 50 mg via INTRAVENOUS
  Filled 2011-03-27: qty 1

## 2011-03-27 MED ORDER — FENTANYL CITRATE 0.05 MG/ML IJ SOLN
INTRAMUSCULAR | Status: AC
  Filled 2011-03-27: qty 2

## 2011-03-27 MED ORDER — FENTANYL CITRATE 0.05 MG/ML IJ SOLN
INTRAMUSCULAR | Status: DC | PRN
  Administered 2011-03-27: 20 ug via INTRATHECAL

## 2011-03-27 MED ORDER — SCOPOLAMINE 1 MG/3DAYS TD PT72
1.0000 | MEDICATED_PATCH | TRANSDERMAL | Status: DC
  Administered 2011-03-27: 1.5 mg via TRANSDERMAL

## 2011-03-27 MED ORDER — HYDROMORPHONE HCL 1 MG/ML IJ SOLN: 0.2500 mg | INTRAMUSCULAR | Status: DC | PRN

## 2011-03-27 MED ORDER — IBUPROFEN 600 MG PO TABS
600.0000 mg | ORAL_TABLET | Freq: Four times a day (QID) | ORAL | Status: DC | PRN
  Filled 2011-03-27 (×3): qty 1

## 2011-03-27 MED ORDER — MEPERIDINE HCL 25 MG/ML IJ SOLN: 6.2500 mg | INTRAMUSCULAR | Status: DC | PRN

## 2011-03-27 MED ORDER — PHENYLEPHRINE 40 MCG/ML (10ML) SYRINGE FOR IV PUSH (FOR BLOOD PRESSURE SUPPORT)
PREFILLED_SYRINGE | INTRAVENOUS | Status: AC
  Filled 2011-03-27: qty 5

## 2011-03-27 MED ORDER — NALOXONE HCL 0.4 MG/ML IJ SOLN: 0.4000 mg | INTRAMUSCULAR | Status: DC | PRN

## 2011-03-27 MED ORDER — DIPHENHYDRAMINE HCL 25 MG PO CAPS: 25.0000 mg | ORAL_CAPSULE | ORAL | Status: DC | PRN

## 2011-03-27 MED ORDER — MORPHINE SULFATE (PF) 0.5 MG/ML IJ SOLN
INTRAMUSCULAR | Status: DC | PRN
  Administered 2011-03-27: 150 ug via INTRATHECAL

## 2011-03-27 MED ORDER — BISACODYL 10 MG RE SUPP: 10.0000 mg | Freq: Every day | RECTAL | Status: DC | PRN

## 2011-03-27 MED ORDER — LANOLIN HYDROUS EX OINT: 1.0000 "application " | TOPICAL_OINTMENT | CUTANEOUS | Status: DC | PRN

## 2011-03-27 MED ORDER — DIPHENHYDRAMINE HCL 50 MG/ML IJ SOLN: 25.0000 mg | INTRAMUSCULAR | Status: DC | PRN

## 2011-03-27 MED ORDER — SIMETHICONE 80 MG PO CHEW: 80.0000 mg | CHEWABLE_TABLET | ORAL | Status: DC | PRN

## 2011-03-27 MED ORDER — ONDANSETRON HCL 4 MG/2ML IJ SOLN: 4.0000 mg | Freq: Three times a day (TID) | INTRAMUSCULAR | Status: DC | PRN

## 2011-03-27 MED ORDER — SODIUM CHLORIDE 0.9 % IJ SOLN: 3.0000 mL | INTRAMUSCULAR | Status: DC | PRN

## 2011-03-27 MED ORDER — ONDANSETRON HCL 4 MG/2ML IJ SOLN
INTRAMUSCULAR | Status: DC | PRN
  Administered 2011-03-27: 4 mg via INTRAVENOUS

## 2011-03-27 MED ORDER — SIMETHICONE 80 MG PO CHEW
80.0000 mg | CHEWABLE_TABLET | Freq: Three times a day (TID) | ORAL | Status: DC
  Administered 2011-03-27 – 2011-03-30 (×10): 80 mg via ORAL

## 2011-03-27 MED ORDER — MORPHINE SULFATE 0.5 MG/ML IJ SOLN
INTRAMUSCULAR | Status: AC
  Filled 2011-03-27: qty 10

## 2011-03-27 MED ORDER — SODIUM CHLORIDE 0.9 % IV SOLN
1.0000 ug/kg/h | INTRAVENOUS | Status: DC | PRN
  Filled 2011-03-27: qty 2.5

## 2011-03-27 MED ORDER — HYDROCHLOROTHIAZIDE 25 MG PO TABS
25.0000 mg | ORAL_TABLET | Freq: Every day | ORAL | Status: DC
  Administered 2011-03-28 – 2011-03-30 (×3): 25 mg via ORAL
  Filled 2011-03-27 (×4): qty 1

## 2011-03-27 MED ORDER — KETOROLAC TROMETHAMINE 60 MG/2ML IM SOLN
60.0000 mg | Freq: Once | INTRAMUSCULAR | Status: AC | PRN
  Administered 2011-03-27: 60 mg via INTRAMUSCULAR

## 2011-03-27 MED ORDER — PRENATAL PLUS 27-1 MG PO TABS
1.0000 | ORAL_TABLET | Freq: Every day | ORAL | Status: DC
  Administered 2011-03-28 – 2011-03-30 (×3): 1 via ORAL
  Filled 2011-03-27 (×3): qty 1

## 2011-03-27 MED ORDER — VENLAFAXINE HCL ER 75 MG PO CP24
75.0000 mg | ORAL_CAPSULE | Freq: Every day | ORAL | Status: DC
  Administered 2011-03-28 – 2011-03-30 (×3): 75 mg via ORAL
  Filled 2011-03-27 (×3): qty 1

## 2011-03-27 MED ORDER — KETOROLAC TROMETHAMINE 30 MG/ML IJ SOLN
30.0000 mg | Freq: Four times a day (QID) | INTRAMUSCULAR | Status: AC | PRN
  Administered 2011-03-27: 30 mg via INTRAVENOUS
  Filled 2011-03-27: qty 1

## 2011-03-27 MED ORDER — DIBUCAINE 1 % RE OINT: 1.0000 "application " | TOPICAL_OINTMENT | RECTAL | Status: DC | PRN

## 2011-03-27 MED ORDER — BUPIVACAINE IN DEXTROSE 0.75-8.25 % IT SOLN
INTRATHECAL | Status: DC | PRN
  Administered 2011-03-27: 1.6 mg via INTRATHECAL

## 2011-03-27 MED ORDER — FLEET ENEMA 7-19 GM/118ML RE ENEM: 1.0000 | ENEMA | RECTAL | Status: DC | PRN

## 2011-03-27 MED ORDER — OXYTOCIN 20 UNITS IN LACTATED RINGERS INFUSION - SIMPLE: 125.0000 mL/h | INTRAVENOUS | Status: AC

## 2011-03-27 MED ORDER — LACTATED RINGERS IV SOLN
INTRAVENOUS | Status: DC
  Administered 2011-03-27 (×4): via INTRAVENOUS

## 2011-03-27 MED ORDER — OXYTOCIN 10 UNIT/ML IJ SOLN
INTRAMUSCULAR | Status: AC
  Filled 2011-03-27: qty 2

## 2011-03-27 MED ORDER — IBUPROFEN 600 MG PO TABS
600.0000 mg | ORAL_TABLET | Freq: Four times a day (QID) | ORAL | Status: DC
  Administered 2011-03-27 – 2011-03-30 (×10): 600 mg via ORAL
  Filled 2011-03-27 (×7): qty 1

## 2011-03-27 MED ORDER — SCOPOLAMINE 1 MG/3DAYS TD PT72
MEDICATED_PATCH | TRANSDERMAL | Status: AC
  Administered 2011-03-27: 1.5 mg via TRANSDERMAL
  Filled 2011-03-27: qty 1

## 2011-03-27 MED ORDER — NITROFURANTOIN MONOHYD MACRO 100 MG PO CAPS
100.0000 mg | ORAL_CAPSULE | Freq: Two times a day (BID) | ORAL | Status: DC
  Filled 2011-03-27 (×3): qty 1

## 2011-03-27 MED ORDER — KETOROLAC TROMETHAMINE 60 MG/2ML IM SOLN
INTRAMUSCULAR | Status: AC
  Administered 2011-03-27: 60 mg via INTRAMUSCULAR
  Filled 2011-03-27: qty 2

## 2011-03-27 MED ORDER — SENNOSIDES-DOCUSATE SODIUM 8.6-50 MG PO TABS
2.0000 | ORAL_TABLET | Freq: Every day | ORAL | Status: DC
  Administered 2011-03-27 – 2011-03-29 (×3): 2 via ORAL

## 2011-03-27 MED ORDER — FERROUS SULFATE 325 (65 FE) MG PO TABS
325.0000 mg | ORAL_TABLET | Freq: Two times a day (BID) | ORAL | Status: DC
  Administered 2011-03-28 – 2011-03-30 (×5): 325 mg via ORAL
  Filled 2011-03-27 (×5): qty 1

## 2011-03-27 MED ORDER — OXYTOCIN 20 UNITS IN LACTATED RINGERS INFUSION - SIMPLE
INTRAVENOUS | Status: DC | PRN
  Administered 2011-03-27 (×2): 20 [IU] via INTRAVENOUS

## 2011-03-27 MED ORDER — KETOROLAC TROMETHAMINE 30 MG/ML IJ SOLN: 30.0000 mg | Freq: Four times a day (QID) | INTRAMUSCULAR | Status: AC | PRN

## 2011-03-27 MED ORDER — LACTATED RINGERS IV SOLN
INTRAVENOUS | Status: DC
  Administered 2011-03-27: 13:00:00 via INTRAVENOUS

## 2011-03-27 SURGICAL SUPPLY — 35 items
BENZOIN TINCTURE PRP APPL 2/3 (GAUZE/BANDAGES/DRESSINGS) ×2 IMPLANT
CLOTH BEACON ORANGE TIMEOUT ST (SAFETY) ×2 IMPLANT
CONTAINER PREFILL 10% NBF 15ML (MISCELLANEOUS) IMPLANT
DRAIN JACKSON PRT FLT 10 (DRAIN) ×2 IMPLANT
DRESSING TELFA 8X3 (GAUZE/BANDAGES/DRESSINGS) ×2 IMPLANT
ELECT REM PT RETURN 9FT ADLT (ELECTROSURGICAL) ×2
ELECTRODE REM PT RTRN 9FT ADLT (ELECTROSURGICAL) ×1 IMPLANT
EVACUATOR SILICONE 100CC (DRAIN) ×2 IMPLANT
EXTRACTOR VACUUM M CUP 4 TUBE (SUCTIONS) IMPLANT
GAUZE SPONGE 4X4 12PLY STRL LF (GAUZE/BANDAGES/DRESSINGS) ×6 IMPLANT
GLOVE BIO SURGEON STRL SZ 6.5 (GLOVE) ×2 IMPLANT
GLOVE BIOGEL PI IND STRL 7.0 (GLOVE) ×1 IMPLANT
GLOVE BIOGEL PI INDICATOR 7.0 (GLOVE) ×1
GOWN PREVENTION PLUS LG XLONG (DISPOSABLE) ×6 IMPLANT
KIT ABG SYR 3ML LUER SLIP (SYRINGE) IMPLANT
NEEDLE HYPO 25X5/8 SAFETYGLIDE (NEEDLE) IMPLANT
NS IRRIG 1000ML POUR BTL (IV SOLUTION) ×2 IMPLANT
PACK C SECTION WH (CUSTOM PROCEDURE TRAY) ×2 IMPLANT
PAD ABD 7.5X8 STRL (GAUZE/BANDAGES/DRESSINGS) ×2 IMPLANT
RTRCTR C-SECT PINK 25CM LRG (MISCELLANEOUS) ×2 IMPLANT
SLEEVE SCD COMPRESS KNEE MED (MISCELLANEOUS) IMPLANT
STAPLER VISISTAT 35W (STAPLE) IMPLANT
STRIP CLOSURE SKIN 1/2X4 (GAUZE/BANDAGES/DRESSINGS) ×2 IMPLANT
SUT CHROMIC 0 CT 1 (SUTURE) ×2 IMPLANT
SUT MNCRL AB 3-0 PS2 27 (SUTURE) ×2 IMPLANT
SUT PLAIN 2 0 (SUTURE) ×2
SUT PLAIN 2 0 XLH (SUTURE) ×2 IMPLANT
SUT PLAIN ABS 2-0 CT1 27XMFL (SUTURE) ×2 IMPLANT
SUT SILK 2 0 SH (SUTURE) ×2 IMPLANT
SUT VIC AB 0 CTX 36 (SUTURE) ×4
SUT VIC AB 0 CTX36XBRD ANBCTRL (SUTURE) ×4 IMPLANT
TAPE CLOTH SURG 4X10 WHT LF (GAUZE/BANDAGES/DRESSINGS) ×2 IMPLANT
TOWEL OR 17X24 6PK STRL BLUE (TOWEL DISPOSABLE) ×4 IMPLANT
TRAY FOLEY CATH 14FR (SET/KITS/TRAYS/PACK) ×2 IMPLANT
WATER STERILE IRR 1000ML POUR (IV SOLUTION) ×2 IMPLANT

## 2011-03-27 NOTE — Anesthesia Procedure Notes (Signed)
Spinal Block  Patient location during procedure: OR Preanesthetic Checklist Completed: patient identified, site marked, surgical consent, pre-op evaluation, timeout performed, IV checked, risks and benefits discussed and monitors and equipment checked Spinal Block Patient position: sitting Prep: DuraPrep Patient monitoring: heart rate, cardiac monitor, continuous pulse ox and blood pressure Approach: midline Location: L3-4 Injection technique: single-shot Needle Needle type: Sprotte  Needle gauge: 24 G Needle length: 9 cm Assessment Sensory level: T4 Additional Notes Spinal Dosage in OR  Bupivicaine ml       1.6 PFMS04   mcg        150 Fentanyl mcg            20    

## 2011-03-27 NOTE — Anesthesia Preprocedure Evaluation (Signed)
Anesthesia Evaluation  Name, MR# and DOB Patient awake  General Assessment Comment  Reviewed: Allergy & Precautions, H&P , NPO status , Patient's Chart, lab work & pertinent test results, reviewed documented beta blocker date and time   History of Anesthesia Complications Negative for: history of anesthetic complications  Airway Mallampati: III TM Distance: >3 FB Neck ROM: full    Dental  (+) Teeth Intact   Pulmonary  clear to auscultation        Cardiovascular hypertension (PIH in prior pregnancy, not currently)regular Normal    Neuro/Psych PSYCHIATRIC DISORDERS (anxiety, depression) Negative Neurological ROS     GI/Hepatic Neg liver ROS  GERD (tums prn)   Endo/Other  Hypothyroidism (no meds - per pt has been normal recently) Morbid obesity  Renal/GU negative Renal ROS     Musculoskeletal   Abdominal   Peds  Hematology negative hematology ROS (+)   Anesthesia Other Findings   Reproductive/Obstetrics (+) Pregnancy (h/o prior c/s x1)                           Anesthesia Physical Anesthesia Plan  ASA: III  Anesthesia Plan: Spinal   Post-op Pain Management:    Induction:   Airway Management Planned:   Additional Equipment:   Intra-op Plan:   Post-operative Plan:   Informed Consent: I have reviewed the patients History and Physical, chart, labs and discussed the procedure including the risks, benefits and alternatives for the proposed anesthesia with the patient or authorized representative who has indicated his/her understanding and acceptance.   Dental Advisory Given  Plan Discussed with: CRNA and Surgeon  Anesthesia Plan Comments:         Anesthesia Quick Evaluation

## 2011-03-27 NOTE — Progress Notes (Signed)
Hand P updated.

## 2011-03-27 NOTE — Op Note (Signed)
Cesarean Section Procedure Note   [name] [name]  03/27/2011  Indications: Scheduled Proceedure/Maternal Request   Pre-operative Diagnosis: Repeat cesarean section ;desire sterilization.   Post-operative Diagnosis: Same   Surgeon Jazariah Teall  Assistants: Manfred Arch CNM  Anesthesia: spinal   Procedure Details:  The patient was seen in the Holding Room. The risks, benefits, complications, treatment options, and expected outcomes were discussed with the patient. The patient concurred with the proposed plan, giving informed consent. identified as [name] and the procedure verified as C-Section Delivery. A Time Out was held and the above information confirmed.  After induction of anesthesia, the patient was draped and prepped in the usual sterile manner. A transverse incision was made and carried down through the subcutaneous tissue to the fascia. Fascial incision was made and extended transversely. The fascia was separated from the underlying rectus tissue superiorly and inferiorly. The peritoneum was identified and entered. Peritoneal incision was extended longitudinally.The alexis retractor was placed.   The utero-vesical peritoneal reflection was incised transversely and the bladder flap was bluntly freed from the lower uterine segment. A low transverse uterine incision was made. Delivered from cephalic presentation was a 7-7 pound Female with Apgar scores of 9 at one minute and 9 at five minutes. Cord ph was not sent the umbilical cord was clamped and cut cord blood was obtained for evaluation. The placenta was removed Intact and appeared normal. The uterine outline, tubes and ovaries appeared normal}. The uterine incision was closed with running locked sutures of 0Vicryl. A second layer of 0 vicryl was used to imbricate the uterus.  The patients left fallopian tube was grasped at the mid ishtmic portion with babcock clamp, ligated with 2-0 plain and excised.  The patients right  fallopian tube was followed out to the fimbriated end.  The mid isthmic portion of the tube was ligated with 2-0 plain and excised.  Both portions of tubes was sent to pathology.     Hemostasis was observed. Lavage was carried out until clear. The fascia was then reapproximated with running sutures of 0Vicryl. The subcuticular closure was performed using 2-0plain gut. The skin was closed with 3-32monocryl.   Instrument, sponge, and needle counts were correct prior the abdominal closure and were correct at the conclusion of the case.    Findings:normal appeaing abdominal and pelvic antomy.  The infant was delivered with a compound presentation.  Clear fluid was noted   Estimated Blood Loss:  Total IV Fluids:   Urine Output: 300CC OF clear urine  Specimens:  Specimens    None       Complications: no complications  Disposition: PACU - hemodynamically stable.   Maternal Condition: stable   Baby condition / location:  nursery-stable  Attending Attestation: I was present and scrubbed for the entire procedure.   Signed: Surgeon(s): Michael Litter, MD

## 2011-03-27 NOTE — Progress Notes (Signed)

## 2011-03-27 NOTE — Addendum Note (Signed)
Addendum  created 03/27/11 1213 by Pat Patrick   Modules edited:Anesthesia Events

## 2011-03-27 NOTE — Op Note (Signed)
Addnedum A JP drain was placed in the subcutaneous tissue

## 2011-03-27 NOTE — Transfer of Care (Signed)
Immediate Anesthesia Transfer of Care Note  Patient: [name] [name]  Procedure(s) Performed:  CESAREAN SECTION WITH BILATERAL TUBAL LIGATION  Patient Location: PACU  Anesthesia Type: Spinal  Level of Consciousness: awake, alert  and oriented  Airway & Oxygen Therapy: Patient Spontanous Breathing  Post-op Assessment: Report given to PACU RN and Post -op Vital signs reviewed and stable  Post vital signs: Reviewed and stable  Complications: No apparent anesthesia complications

## 2011-03-27 NOTE — Consult Note (Signed)
Asked to attend repeat elective C/S with no reported risk factors.  At delivery AROM (clear) and infant in vertex, manually expressed with spontaneous cries and active MAE. Given tactile stim and buib suction. No dysmorphic features.   Shown to parents and then carried to Transitional Nursery. Care to assigned pediatrician.  Dagoberto Ligas MD [facility]

## 2011-03-27 NOTE — Anesthesia Postprocedure Evaluation (Signed)
  Anesthesia Post-op Note  Patient: [name] [name]  Procedure(s) Performed:  CESAREAN SECTION WITH BILATERAL TUBAL LIGATION   Patient is awake, responsive, moving her legs, and has signs of resolution of her numbness. Pain and nausea are reasonably well controlled. Vital signs are stable and clinically acceptable. Oxygen saturation is clinically acceptable. There are no apparent anesthetic complications at this time. Patient is ready for discharge.

## 2011-03-28 LAB — CBC
HCT: 29.9 % — ABNORMAL LOW (ref 36.0–46.0)
MCH: 30.3 pg (ref 26.0–34.0)
MCHC: 34.1 g/dL (ref 30.0–36.0)
RDW: 15 % (ref 11.5–15.5)

## 2011-03-28 LAB — COMPREHENSIVE METABOLIC PANEL
ALT: 7 U/L (ref 0–35)
AST: 15 U/L (ref 0–37)
Alkaline Phosphatase: 166 U/L — ABNORMAL HIGH (ref 39–117)
CO2: 26 mEq/L (ref 19–32)
Calcium: 9.1 mg/dL (ref 8.4–10.5)
GFR calc Af Amer: >90 mL/min (ref >90)
Glucose, Bld: 79 mg/dL (ref 70–99)
Potassium: 4.4 mEq/L (ref 3.5–5.1)
Sodium: 137 mEq/L (ref 135–145)
Total Protein: 5.5 g/dL — ABNORMAL LOW (ref 6.0–8.3)

## 2011-03-28 LAB — LACTATE DEHYDROGENASE: LDH: 267 U/L — ABNORMAL HIGH (ref 94–250)

## 2011-03-28 LAB — PROTEIN, URINE, 24 HOUR
Protein, 24H Urine: 210 mg/d — ABNORMAL HIGH (ref 50–100)
Protein, Urine: 5 mg/dL
Urine Total Volume-UPROT: 4200 mL

## 2011-03-28 NOTE — Progress Notes (Addendum)
Subjective: Postpartum Day 1: Cesarean Delivery with BTL Doing well.  Stood at bedside without syncope or dizziness.  Foley still in place for 24 hour urine, due to sporadic mild elevations of BP pre-delivery.  Pain well controlled with po meds.  Breastfeeding.  Denies HA, visual symptoms, or epigastric pain.  On HCTZ for history of PIH and post-delivery fluid retention after last delivery.  Objective: Vital signs in last 24 hours: Temp:  [97.4 F (36.3 C)-98.4 F (36.9 C)] 98.1 F (36.7 C) (10/12 0330) Pulse Rate:  [64-101] 86  (10/12 0330) Resp:  [16-35] 18  (10/12 0330) BP: (97-154)/(65-87) 97/67 mmHg (10/12 0330) SpO2:  [97 %-100 %] 98 % (10/12 0700) Weight:  [127.007 kg (280 lb)] 280 lb (127.007 kg) (10/11 1208)  Physical Exam:  General: alert Lochia: appropriate Uterine Fundus: firm Incision: Dressing CDI, JP drain with 5 cc at present. DVT Evaluation: No evidence of DVT seen on physical exam. Negative Homan's sign.  DTRs 1+ without clonus.  1+ edema.   Basename 03/28/11 0540  HGB 10.2*  HCT 29.9*    Assessment/Plan: Status post Cesarean section. Doing well postoperatively.  Continue current care. Continue 24 hour urine--will complete at 12 noon today. Continue HCTZ daily--anticipate plan for completion of a 7 day course.  Nigel Bridgeman 03/28/2011, 8:33 AM   Agree with Above - AYR

## 2011-03-29 LAB — COMPREHENSIVE METABOLIC PANEL
ALT: 8 U/L (ref 0–35)
Alkaline Phosphatase: 163 U/L — ABNORMAL HIGH (ref 39–117)
BUN: 10 mg/dL (ref 6–23)
CO2: 30 mEq/L (ref 19–32)
GFR calc Af Amer: >90 mL/min (ref >90)
GFR calc non Af Amer: >90 mL/min (ref >90)
Glucose, Bld: 76 mg/dL (ref 70–99)
Potassium: 4 mEq/L (ref 3.5–5.1)
Sodium: 137 mEq/L (ref 135–145)
Total Bilirubin: 0.1 mg/dL — ABNORMAL LOW (ref 0.3–1.2)
Total Protein: 5.3 g/dL — ABNORMAL LOW (ref 6.0–8.3)

## 2011-03-29 LAB — URIC ACID: Uric Acid, Serum: 5.2 mg/dL (ref 2.4–7.0)

## 2011-03-29 NOTE — Progress Notes (Signed)
  Subjective: Postpartum Day 2 Cesarean Delivery Patient reports tolerating PO, + flatus and no problems voiding.  Ambulating without syncope BF well, pos bonding, mood stable -no s/s depression   Objective: Vital signs in last 24 hours: Temp:  [97.6 F (36.4 C)-98.2 F (36.8 C)] 98.1 F (36.7 C) (10/13 0615) Pulse Rate:  [82-90] 82  (10/13 0615) Resp:  [17-18] 18  (10/13 0615) BP: (112-126)/(75-83) 116/75 mmHg (10/13 0615)  Physical Exam:  General: alert and no distress Heart: RRR Lungs: CTAB Lochia: appropriate Uterine Fundus: firm Incision: healing well, JP drain, scant drainage BS x4 DVT Evaluation: No evidence of DVT seen on physical exam. Negative Homan's sign.   Basename 03/28/11 0540  HGB 10.2*  HCT 29.9*    Assessment/Plan: Status post Cesarean section. Doing well postoperatively.  Continue current care.  Laqueena Hinchey M 03/29/2011, 1:02 PM

## 2011-03-30 DIAGNOSIS — Z98891 History of uterine scar from previous surgery: Secondary | ICD-10-CM

## 2011-03-30 DIAGNOSIS — Z9851 Tubal ligation status: Secondary | ICD-10-CM

## 2011-03-30 LAB — URIC ACID: Uric Acid, Serum: 5.4 mg/dL (ref 2.4–7.0)

## 2011-03-30 LAB — COMPREHENSIVE METABOLIC PANEL
Albumin: 2.5 g/dL — ABNORMAL LOW (ref 3.5–5.2)
Alkaline Phosphatase: 150 U/L — ABNORMAL HIGH (ref 39–117)
BUN: 11 mg/dL (ref 6–23)
Calcium: 9.3 mg/dL (ref 8.4–10.5)
Creatinine, Ser: 0.57 mg/dL (ref 0.50–1.10)
GFR calc Af Amer: >90 mL/min (ref >90)
Glucose, Bld: 79 mg/dL (ref 70–99)
Total Protein: 6.2 g/dL (ref 6.0–8.3)

## 2011-03-30 MED ORDER — OXYCODONE-ACETAMINOPHEN 5-325 MG PO TABS: 1.0000 | ORAL_TABLET | ORAL | Status: AC | PRN

## 2011-03-30 MED ORDER — HYDROCHLOROTHIAZIDE 25 MG PO TABS: 25.0000 mg | ORAL_TABLET | Freq: Every day | ORAL | Status: DC

## 2011-03-30 MED ORDER — IBUPROFEN 600 MG PO TABS: 600.0000 mg | ORAL_TABLET | Freq: Four times a day (QID) | ORAL | Status: AC | PRN

## 2011-03-30 NOTE — Discharge Summary (Signed)
Obstetric Discharge Summary Reason for Admission: cesarean section, repeat, with BTL Prenatal Procedures: ultrasound Intrapartum Procedures: cesarean: low cervical, transverse and tubal ligation Postpartum Procedures: Removal of JP drain Complications-Operative and Postpartum: none Hemoglobin  Date Value Range Status  03/28/2011 10.2* 12.0-15.0 (g/dL) Final     HCT  Date Value Range Status  03/28/2011 29.9* 36.0-46.0 (%) Final    Discharge Diagnoses: Term Pregnancy-delivered                                         Repeat C/S with BTL  Discharge Information: Date: 03/30/2011 Activity: Per [facility] handout Diet: routine Medications: Ibuprofen and Percocet Condition: stable Instructions: refer to practice specific booklet Discharge to: home Follow-up Information    Follow up with [facility] in 6 weeks.         Newborn Data: Live born female  Birth Weight: 7 lb 7.1 oz (3375 g) APGAR: 9, 9  Home with mother.  Margorie Renner 03/30/2011, 10:01 AM

## 2011-03-30 NOTE — Progress Notes (Signed)
Subjective: Postpartum Day 3: Cesarean Delivery with BTL Patient reports no problems voiding.  Doing well. Ready for d/c.  Objective: Vital signs in last 24 hours: Temp:  [97.9 F (36.6 C)-98.2 F (36.8 C)] 97.9 F (36.6 C) (10/14 0707) Pulse Rate:  [83-103] 83  (10/14 0707) Resp:  [20-24] 20  (10/14 0707) BP: (122-138)/(79-81) 122/79 mmHg (10/14 0707) SpO2:  [98 %] 98 % (10/13 2130)  Physical Exam:  General: alert Lochia: appropriate Uterine Fundus: firm Incision: healing well.  JP drain had < 5cc, removed without difficulty. DVT Evaluation: No evidence of DVT seen on physical exam. Negative Homan's sign.   Basename 03/28/11 0540  HGB 10.2*  HCT 29.9*    Assessment/Plan: Status post Cesarean section. Doing well postoperatively.  Discharge home with standard precautions and return to clinic in 4-6 weeks Rx Ibuprophen, Percocet, HCTZ x 7 days.  Nigel Bridgeman 03/30/2011, 2:08 PM

## 2011-03-31 LAB — POCT RAPID STREP A: Streptococcus, Group A Screen (Direct): NEGATIVE

## 2011-04-05 ENCOUNTER — Emergency Department: Payer: Self-pay | Admitting: *Deleted

## 2011-04-22 ENCOUNTER — Inpatient Hospital Stay (HOSPITAL_COMMUNITY)
Admission: AD | Admit: 2011-04-22 | Discharge: 2011-04-22 | Disposition: A | Discharge status: Home / Self Care | Payer: 59 | Source: Ambulatory Visit | Attending: Obstetrics and Gynecology | Admitting: Obstetrics and Gynecology

## 2011-04-22 DIAGNOSIS — R109 Unspecified abdominal pain: Secondary | ICD-10-CM

## 2011-04-22 DIAGNOSIS — M545 Low back pain, unspecified: Secondary | ICD-10-CM | POA: Insufficient documentation

## 2011-04-22 DIAGNOSIS — Z87442 Personal history of urinary calculi: Secondary | ICD-10-CM

## 2011-04-22 DIAGNOSIS — R319 Hematuria, unspecified: Secondary | ICD-10-CM | POA: Insufficient documentation

## 2011-04-22 DIAGNOSIS — O99893 Other specified diseases and conditions complicating puerperium: Secondary | ICD-10-CM | POA: Insufficient documentation

## 2011-04-22 LAB — DIFFERENTIAL
Lymphs Abs: 1.9 10*3/uL (ref 0.7–4.0)
Monocytes Absolute: 0.7 10*3/uL (ref 0.1–1.0)
Monocytes Relative: 6 % (ref 3–12)
Neutro Abs: 10.3 10*3/uL — ABNORMAL HIGH (ref 1.7–7.7)
Neutrophils Relative %: 79 % — ABNORMAL HIGH (ref 43–77)

## 2011-04-22 LAB — URINALYSIS, ROUTINE W REFLEX MICROSCOPIC
Nitrite: NEGATIVE
Protein, ur: NEGATIVE mg/dL
Specific Gravity, Urine: 1.02 (ref 1.005–1.030)
Urobilinogen, UA: 0.2 mg/dL (ref 0.0–1.0)

## 2011-04-22 LAB — CBC
HCT: 37.5 % (ref 36.0–46.0)
Hemoglobin: 12.5 g/dL (ref 12.0–15.0)
MCH: 29.1 pg (ref 26.0–34.0)
RBC: 4.3 MIL/uL (ref 3.87–5.11)

## 2011-04-22 LAB — URINE MICROSCOPIC-ADD ON

## 2011-04-22 MED ORDER — ONDANSETRON 4 MG PO TBDP
4.0000 mg | ORAL_TABLET | Freq: Once | ORAL | Status: AC
  Administered 2011-04-22: 4 mg via ORAL
  Filled 2011-04-22: qty 1

## 2011-04-22 MED ORDER — ONDANSETRON 8 MG PO TBDP
8.0000 mg | ORAL_TABLET | Freq: Once | ORAL | Status: AC
  Administered 2011-04-22: 8 mg via ORAL
  Filled 2011-04-22: qty 1

## 2011-04-22 MED ORDER — KETOROLAC TROMETHAMINE 60 MG/2ML IM SOLN
60.0000 mg | Freq: Once | INTRAMUSCULAR | Status: AC
  Administered 2011-04-22: 60 mg via INTRAMUSCULAR
  Filled 2011-04-22: qty 2

## 2011-04-22 MED ORDER — KETOROLAC TROMETHAMINE 10 MG PO TABS: 10.0000 mg | ORAL_TABLET | Freq: Four times a day (QID) | ORAL | Status: AC | PRN

## 2011-04-22 NOTE — Progress Notes (Signed)
Eustace Pen, CNM at bedside.  Assessment done and poc discussed with pt.

## 2011-04-22 NOTE — Progress Notes (Signed)
CBC drawn as lab is unavailable at this time.

## 2011-04-22 NOTE — Progress Notes (Signed)
Notified of pt presenting for kidney stone pain. CNM to place orders and will be up to see pt.

## 2011-04-22 NOTE — Progress Notes (Signed)
Pt took percocet at [employer]

## 2011-04-22 NOTE — ED Provider Notes (Signed)
History   [name] [name] is a 24y.o. MWF P2002 who presents unannounced 26 days PP w/ CC of Lt back pain and feeling like she may have a "kidney stone."  Pain currently 7/10, doesn't radiate; took percocet around 0330.  Pain started around 2100 last night.  Last ate around 1930.  This AM has started feeling nauseated, and has thrown up x1 since in MAU.  Declines IV or additional pain meds at present, stating she has to take one of her daughters to a doctor's appointment around lunch today and is worried about pain medicine affecting her driving and reaction time.  Denies fever or chills.  Reports incident of sinusitis since delivery, resolved spontaneously w/o antibiotic.  Lochia is intermittent, light spotting.  No increase in incision pain or other GI c/o's.  She is breastfeeding and supplementing.  Pt had a repeat c/s and BTL 03/27/11.  Her history is remarkable for a 4mm stone rt kidney end of august, and stone also during her first pregnancy around 22 weeks.  Pt was hospitalized end of august for a few days with the most recent stone incident, and denies ever passing it.  She is currently only taking Effexor XR 75mg  po qd and a PNV daily.  Hx also r/f:  1.  Prev c/s x2  2.  H/o PIH--questionable CHTN  3.  Obese  4.  Depression/Anxiety  5.  Hypothyroidism--no current meds  6.  H/o infertility--clomid w/ G1, G2 spont  7.  Rh negative.    Chief Complaint  Patient presents with  . Back Pain   HPI  OB History    Grav Para Term Preterm Abortions TAB SAB Ect Mult Living   2 2 2  0 0 0 0 0 0 2      Past Medical History  Diagnosis Date  . HYPOTHYROIDISM 04/05/2010  . OBESITY 04/05/2010  . DEPRESSION 04/10/2010  . Anxiety   . Infertility, female   . HYPERTENSION 04/05/2010    PIH with last preg  . Pregnancy induced hypertension   . Anemia     after 1st delivery  . Kidney stones     recurrent  . GERD (gastroesophageal reflux disease)     pregnancy related- no meds    Past Surgical History    Procedure Date  . Cesarean section   . Tonsillectomy   . Wisdom tooth extraction     Family History  Problem Relation Age of Onset  . Diabetes Other   . Hyperlipidemia Other   . Hypertension Other   . Hypertension Mother   . Hyperlipidemia Father   . Hypertension Father   . Kidney disease Father   . Cancer Father   . Hypertension Maternal Grandmother   . Diabetes Maternal Grandfather     History  Substance Use Topics  . Smoking status: Never Smoker   . Smokeless tobacco: Never Used  . Alcohol Use: No    Allergies: No Known Allergies  Prescriptions prior to admission  Medication Sig Dispense Refill  . oxyCODONE-acetaminophen (PERCOCET) 5-325 MG per tablet Take 2 tablets by mouth every 6 (six) hours as needed. For severe pain       . prenatal vitamin w/FE, FA (PRENATAL 1 + 1) 27-1 MG TABS Take 1 tablet by mouth daily.        Marland Kitchen venlafaxine (EFFEXOR-XR) 75 MG 24 hr capsule TAKE 1 CAPSULE BY MOUTH ONCE DAILY  30 capsule  3    ROS--see HPI Physical Exam   Blood pressure  137/88, pulse 78, temperature 98.3 F (36.8 C), temperature source Oral, resp. rate 20, height 5\' 3"  (1.6 m), weight 121.11 kg (267 lb), SpO2 98.00%, currently breastfeeding. LABS:  Results for orders placed during the hospital encounter of 04/22/11 (from the past 24 hour(s))  URINALYSIS, ROUTINE W REFLEX MICROSCOPIC     Status: Abnormal   Collection Time   04/22/11  5:15 AM      Component Value Range   Color, Urine YELLOW  YELLOW    Appearance CLEAR  CLEAR    Specific Gravity, Urine 1.020  1.005 - 1.030    pH 5.5  5.0 - 8.0    Glucose, UA NEGATIVE  NEGATIVE (mg/dL)   Hgb urine dipstick LARGE (*) NEGATIVE    Bilirubin Urine NEGATIVE  NEGATIVE    Ketones, ur NEGATIVE  NEGATIVE (mg/dL)   Protein, ur NEGATIVE  NEGATIVE (mg/dL)   Urobilinogen, UA 0.2  0.0 - 1.0 (mg/dL)   Nitrite NEGATIVE  NEGATIVE    Leukocytes, UA MODERATE (*) NEGATIVE   URINE MICROSCOPIC-ADD ON     Status: Abnormal   Collection  Time   04/22/11  5:15 AM      Component Value Range   Squamous Epithelial / LPF RARE  RARE    WBC, UA 7-10  <3 (WBC/hpf)   RBC / HPF 7-10  <3 (RBC/hpf)   Bacteria, UA FEW (*) RARE   CBC     Status: Abnormal   Collection Time   04/22/11  6:22 AM      Component Value Range   WBC 13.0 (*) 4.0 - 10.5 (K/uL)   RBC 4.30  3.87 - 5.11 (MIL/uL)   Hemoglobin 12.5  12.0 - 15.0 (g/dL)   HCT 40.9  81.1 - 91.4 (%)   MCV 87.2  78.0 - 100.0 (fL)   MCH 29.1  26.0 - 34.0 (pg)   MCHC 33.3  30.0 - 36.0 (g/dL)   RDW 78.2  95.6 - 21.3 (%)   Platelets 246  150 - 400 (K/uL)  DIFFERENTIAL     Status: Abnormal   Collection Time   04/22/11  6:22 AM      Component Value Range   Neutrophils Relative 79 (*) 43 - 77 (%)   Neutro Abs 10.3 (*) 1.7 - 7.7 (K/uL)   Lymphocytes Relative 15  12 - 46 (%)   Lymphs Abs 1.9  0.7 - 4.0 (K/uL)   Monocytes Relative 6  3 - 12 (%)   Monocytes Absolute 0.7  0.1 - 1.0 (K/uL)   Eosinophils Relative 1  0 - 5 (%)   Eosinophils Absolute 0.1  0.0 - 0.7 (K/uL)   Basophils Relative 0  0 - 1 (%)   Basophils Absolute 0.0  0.0 - 0.1 (K/uL)   Physical Exam  Constitutional: She is oriented to person, place, and time. She appears well-developed and well-nourished. No distress.  Cardiovascular: Normal rate.   Respiratory: Effort normal.  GI: Soft. Bowel sounds are normal. She exhibits no distension and no mass. There is no tenderness. There is no rebound and no guarding.       Incision OTA, C/D/I, no s/s of infection Uterus not palpated  Genitourinary:       deferred  Musculoskeletal: She exhibits no edema.       Positive CVAT bilaterally, Lt >Rt  Neurological: She is alert and oriented to person, place, and time.  Skin: Skin is warm and dry.  Psychiatric: She has a normal mood and affect. Her behavior  is normal. Judgment and thought content normal.    MAU Course  Procedures 1. CBC w/ diff 2.  U/a and c&s  Assessment and Plan  1.  26 days PP 2.  S/p repeat c/s & btl 3.   Hematuria and Lt back pain 4.  Suspected kidney stone recurrence 5.  N/V 6.  Lactating  1.  Per c/w dr. Estanislado Pandy, will see how pt does w/ po Zofran and if pain stabilizes 2.  Possible outpatient imaging and likely urology referral 3.  Pt offered IVF and pain meds and declined  Babak Lucus H 04/22/2011, 6:45 AM

## 2011-04-22 NOTE — ED Provider Notes (Signed)
Patient still declined IV or po narcotics, since she has to transport herself home. Pain still present in left flank, but "tolerable" per patient report. Received Zofran 4 mg ODT initially, now requests another 4 mg dose--did vomit once since Zofran, but reports "that made her feel better".    I consulted with Dr. Pennie Rushing, who had discussed case with Dr. Estanislado Pandy. Both recommend outpatient imaging with referral back to [facility] (patient saw them for kidney stones in 1st pregnancy) for decision on best imaging plan.   Dr. Pennie Rushing proposed IV Ibuprophen, but that form is not available at this hospital.  Patient agreeable with plan for outpatient follow-up, has Zofran and Percocet at home, feels able to push po fluids. Will have office contact [facility] for referral/imaging plan ASAP. Rx Toradol 10 mg po q 6 hours x 5 days prn.  D/C home with d/c instructions re:  Kidney stones. Follow-up with [facility], or with ER if symptoms worsen.  Nigel Bridgeman, CNM 04/22/11 0830

## 2011-04-22 NOTE — Progress Notes (Signed)
Pt presents to [facility] for c/o kidney stone pain.  States she has had kidney stones frequently in the past.  Is 3 weeks post c/s.

## 2011-04-22 NOTE — Progress Notes (Signed)
Pt states she has a history of kidney stones, she is 3 weeks postpartum C/S and BTL. Now with left sided back pain and nausea

## 2011-04-24 ENCOUNTER — Other Ambulatory Visit: Payer: Self-pay | Admitting: Urology

## 2011-04-24 LAB — URINE CULTURE

## 2011-04-29 ENCOUNTER — Encounter (HOSPITAL_COMMUNITY): Payer: Self-pay

## 2011-05-01 ENCOUNTER — Other Ambulatory Visit: Payer: Self-pay | Admitting: Urology

## 2011-05-01 ENCOUNTER — Encounter (HOSPITAL_COMMUNITY): Payer: Self-pay | Admitting: *Deleted

## 2011-05-01 NOTE — Pre-Procedure Instructions (Signed)
Take laxative,eat a light dinner 05-04-11, do not take any Aspirin,Advil or Toradol 72 hours prior to surgery

## 2011-05-02 ENCOUNTER — Telehealth: Payer: Self-pay | Admitting: Urology

## 2011-05-02 ENCOUNTER — Other Ambulatory Visit (HOSPITAL_COMMUNITY): Payer: Self-pay | Admitting: *Deleted

## 2011-05-02 DIAGNOSIS — N201 Calculus of ureter: Secondary | ICD-10-CM

## 2011-05-03 DIAGNOSIS — N201 Calculus of ureter: Secondary | ICD-10-CM | POA: Insufficient documentation

## 2011-05-04 NOTE — H&P (Signed)
History of Present Illness     25 y/o white female, 4 weeks postpartum, with h/o nephrolithiasis presents in consultation from Dr. Estanislado Pandy for further evaluation of left flank pain.  She was hospitalized in August during her pregnancy due to uncontrolled left flank pain but this resolved with pain medications.  She was feeling well until Monday night 04/21/11 when she developed sudden onset severe left flank pain.  She was evaluated with her OB/Gyn 04/22/11 and given Toradal IM with good relief.  She has been using Toradal po prn which helps as well.  Her pain has persisted but is tolerable with Toradal.  No imaging studies were performed 03/22/11.  She denies gross hematuria, dysuria, increased frequency, urgency, fever or chills.   Past Medical History Problems  1. History of  Anxiety (Symptom) 300.00 2. History of  Depression 311 3. History of  Essential Hypertension 401.9 4. History of  Nephrolithiasis V13.01  Surgical History Problems  1. History of  Cesarean Section 2. History of  Tonsillectomy 3. History of  Tubal Ligation V25.2  Current Meds 1. Effexor XR 75 MG Oral Capsule Extended Release 24 Hour; Therapy: (Recorded:08Nov2012) to 2. Multi-Vitamin TABS; Therapy: (Recorded:07Apr2008) to  Allergies Medication  1. No Known Drug Allergies  Family History Problems  1. Family history of  Family Health Status Number Of Children 2 girls 2. Paternal history of  Nephrolithiasis  Social History Problems  1. Alcohol Use social 2. Caffeine Use 3. 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 History of  Marital History - Single 6. History of  Tobacco Use V15.82  Review of Systems Genitourinary, constitutional, skin, eye, otolaryngeal, hematologic/lymphatic, cardiovascular, pulmonary, endocrine, musculoskeletal, gastrointestinal, neurological and psychiatric system(s) were reviewed and pertinent findings if present are noted.  Gastrointestinal:  flank pain and abdominal pain.    Vitals Vital Signs [Data Includes: Last 1 Day]  08Nov2012 09:37AM  BMI Calculated: 47.31 BSA Calculated: 2.19 Height: 5 ft 3 in Weight: 267 lb  08Nov2012 09:36AM  Blood Pressure: 130 / 85 Heart Rate: 70  Physical Exam Constitutional: Well nourished and well developed . No acute distress.  ENT:. The ears and nose are normal in appearance.  Neck: The appearance of the neck is normal and no neck mass is present.  Pulmonary: No respiratory distress and normal respiratory rhythm and effort.  Cardiovascular:. No peripheral edema.  Skin: Normal skin turgor, no visible rash and no visible skin lesions.  Neuro/Psych:. Mood and affect are appropriate.    Results/Data Urine [Data Includes: Last 1 Day]  08Nov2012  COLOR: YELLOW  Reference Range YELLOW APPEARANCE: CLEAR  Reference Range CLEAR SPECIFIC GRAVITY: 1.025  Reference Range 1.005-1.030 pH: 5.5  Reference Range 5.0-8.0 GLUCOSE: NEG mg/dL Reference Range NEG BILIRUBIN: NEG  Reference Range NEG KETONE: NEG mg/dL Reference Range NEG BLOOD: LARGE  Abnormal Reference Range NEG PROTEIN: NEG mg/dL Reference Range NEG UROBILINOGEN: 0.2 mg/dL Reference Range 2.1-3.0 NITRITE: NEG  Reference Range NEG LEUKOCYTE ESTERASE: SMALL  Abnormal Reference Range NEG SQUAMOUS EPITHELIAL/HPF: RARE  Reference Range RARE WBC: 0-3 WBC/hpf Reference Range <4 RBC: 7-10 RBC/hpf Abnormal Reference Range <4 BACTERIA: NONE SEEN  Reference Range RARE CRYSTALS: NONE SEEN  Reference Range NEG CASTS: NONE SEEN  Reference Range NEG  The following images/tracing/specimen were independently visualized: .  CTU demonstrates a left hydroureteronephrosis due to a 1cm proximal left ureteral stone.  No furthe urolithiasis noted.  The following clinical lab reports were reviewed: Marland Kitchen  Urinalysis Selected Results  UA  With REFLEX 08Nov2012 09:17AM [name], [name]   Test Name Result Flag Reference  COLOR YELLOW  YELLOW  APPEARANCE CLEAR   CLEAR  SPECIFIC GRAVITY 1.025  1.005-1.030  pH 5.5  5.0-8.0  GLUCOSE NEG mg/dL  NEG  BILIRUBIN NEG  NEG  KETONE NEG mg/dL  NEG  BLOOD LARGE A NEG  PROTEIN NEG mg/dL  NEG  UROBILINOGEN 0.2 mg/dL  1.6-1.0  NITRITE NEG  NEG  LEUKOCYTE ESTERASE SMALL A NEG  SQUAMOUS EPITHELIAL/HPF RARE  RARE  WBC 0-3 WBC/hpf  <4  RBC 7-10 RBC/hpf A <4  BACTERIA NONE SEEN  RARE  CRYSTALS NONE SEEN  NEG  CASTS NONE SEEN  NEG   Assessment Assessed  1. Nephrolithiasis 592.0 2. Diffuse Abdominal Pain 789.07  Plan Diffuse Abdominal Pain (789.07)  1. AU CT-STONE PROTOCOL  Done: 08Nov2012 12:00AM Health Maintenance (V70.0)  2. UA With REFLEX  Done: 08Nov2012 09:17AM     She has percocet at home to use prn severe pain.  Schedule left ESWL.  Orders completed.  She knows to call the office for fever, chills or uncontrolled pain.   History of Present Illness     25 y/o white female, 4 weeks postpartum, with h/o nephrolithiasis presents in consultation from Dr. Estanislado Pandy for further evaluation of left flank pain.  She was hospitalized in August during her pregnancy due to uncontrolled left flank pain but this resolved with pain medications.  She was feeling well until Monday night 04/21/11 when she developed sudden onset severe left flank pain.  She was evaluated with her OB/Gyn 04/22/11 and given Toradal IM with good relief.  She has been using Toradal po prn which helps as well.  Her pain has persisted but is tolerable with Toradal.  No imaging studies were performed 03/22/11.  She denies gross hematuria, dysuria, increased frequency, urgency, fever or chills.   Past Medical History Problems  1. History of  Anxiety (Symptom) 300.00 2. History of  Depression 311 3. History of  Essential Hypertension 401.9 4. History of  Nephrolithiasis V13.01  Surgical History Problems  1. History of  Cesarean Section 2. History of  Tonsillectomy 3. History of  Tubal Ligation V25.2  Current Meds 1. Effexor XR 75 MG Oral  Capsule Extended Release 24 Hour; Therapy: (Recorded:08Nov2012) to 2. Multi-Vitamin TABS; Therapy: (Recorded:07Apr2008) to  Allergies Medication  1. No Known Drug Allergies  Family History Problems  1. Family history of  Family Health Status Number Of Children 2 girls 2. Paternal history of  Nephrolithiasis  Social History Problems  1. Alcohol Use social 2. Caffeine Use 3. 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 History of  Marital History - Single 6. History of  Tobacco Use V15.82  Review of Systems Genitourinary, constitutional, skin, eye, otolaryngeal, hematologic/lymphatic, cardiovascular, pulmonary, endocrine, musculoskeletal, gastrointestinal, neurological and psychiatric system(s) were reviewed and pertinent findings if present are noted.  Gastrointestinal: flank pain and abdominal pain.    Vitals Vital Signs [Data Includes: Last 1 Day]  08Nov2012 09:37AM  BMI Calculated: 47.31 BSA Calculated: 2.19 Height: 5 ft 3 in Weight: 267 lb  08Nov2012 09:36AM  Blood Pressure: 130 / 85 Heart Rate: 70  Physical Exam Constitutional: Well nourished and well developed . No acute distress.  ENT:. The ears and nose are normal in appearance.  Neck: The appearance of the neck is normal and no neck mass is present.  Pulmonary: No respiratory distress and normal respiratory rhythm and effort.  Cardiovascular:. No peripheral edema.  Skin: Normal skin turgor, no visible rash and no visible skin lesions.  Neuro/Psych:. Mood and affect are appropriate.    Results/Data Urine [Data Includes: Last 1 Day]  08Nov2012  COLOR: YELLOW  Reference Range YELLOW APPEARANCE: CLEAR  Reference Range CLEAR SPECIFIC GRAVITY: 1.025  Reference Range 1.005-1.030 pH: 5.5  Reference Range 5.0-8.0 GLUCOSE: NEG mg/dL Reference Range NEG BILIRUBIN: NEG  Reference Range NEG KETONE: NEG mg/dL Reference Range NEG BLOOD: LARGE  Abnormal Reference Range NEG PROTEIN:  NEG mg/dL Reference Range NEG UROBILINOGEN: 0.2 mg/dL Reference Range 9.8-1.1 NITRITE: NEG  Reference Range NEG LEUKOCYTE ESTERASE: SMALL  Abnormal Reference Range NEG SQUAMOUS EPITHELIAL/HPF: RARE  Reference Range RARE WBC: 0-3 WBC/hpf Reference Range <4 RBC: 7-10 RBC/hpf Abnormal Reference Range <4 BACTERIA: NONE SEEN  Reference Range RARE CRYSTALS: NONE SEEN  Reference Range NEG CASTS: NONE SEEN  Reference Range NEG  The following images/tracing/specimen were independently visualized: .  CTU demonstrates a left hydroureteronephrosis due to a 1cm proximal left ureteral stone.  No furthe urolithiasis noted.  The following clinical lab reports were reviewed: Marland Kitchen  Urinalysis Selected Results  UA With REFLEX 08Nov2012 09:17AM [name], [name]   Test Name Result Flag Reference  COLOR YELLOW  YELLOW  APPEARANCE CLEAR  CLEAR  SPECIFIC GRAVITY 1.025  1.005-1.030  pH 5.5  5.0-8.0  GLUCOSE NEG mg/dL  NEG  BILIRUBIN NEG  NEG  KETONE NEG mg/dL  NEG  BLOOD LARGE A NEG  PROTEIN NEG mg/dL  NEG  UROBILINOGEN 0.2 mg/dL  9.1-4.7  NITRITE NEG  NEG  LEUKOCYTE ESTERASE SMALL A NEG  SQUAMOUS EPITHELIAL/HPF RARE  RARE  WBC 0-3 WBC/hpf  <4  RBC 7-10 RBC/hpf A <4  BACTERIA NONE SEEN  RARE  CRYSTALS NONE SEEN  NEG  CASTS NONE SEEN  NEG   Assessment Assessed  1. Nephrolithiasis 592.0 2. Diffuse Abdominal Pain 789.07  Plan Diffuse Abdominal Pain (789.07)  1. AU CT-STONE PROTOCOL  Done: 08Nov2012 12:00AM Health Maintenance (V70.0)  2. UA With REFLEX  Done: 08Nov2012 09:17AM     She has percocet at home to use prn severe pain.  Schedule left ESWL.  Orders completed.  She knows to call the office for fever, chills or uncontrolled pain.   @[email or web address] @

## 2011-05-05 ENCOUNTER — Telehealth (HOSPITAL_COMMUNITY): Payer: Self-pay | Admitting: *Deleted

## 2011-05-05 ENCOUNTER — Encounter (HOSPITAL_COMMUNITY)
Admission: RE | Disposition: A | Discharge status: Home / Self Care | Payer: Self-pay | Source: Ambulatory Visit | Attending: Urology

## 2011-05-05 ENCOUNTER — Ambulatory Visit (HOSPITAL_COMMUNITY): Payer: 59

## 2011-05-05 ENCOUNTER — Telehealth: Payer: Self-pay | Admitting: Urology

## 2011-05-05 ENCOUNTER — Encounter (HOSPITAL_COMMUNITY): Payer: Self-pay | Admitting: *Deleted

## 2011-05-05 ENCOUNTER — Ambulatory Visit (HOSPITAL_COMMUNITY)
Admission: RE | Admit: 2011-05-05 | Discharge: 2011-05-05 | Disposition: A | Discharge status: Home / Self Care | Payer: 59 | Source: Ambulatory Visit | Attending: Urology | Admitting: Urology

## 2011-05-05 DIAGNOSIS — Z79899 Other long term (current) drug therapy: Secondary | ICD-10-CM | POA: Insufficient documentation

## 2011-05-05 DIAGNOSIS — R1084 Generalized abdominal pain: Secondary | ICD-10-CM | POA: Insufficient documentation

## 2011-05-05 DIAGNOSIS — N201 Calculus of ureter: Secondary | ICD-10-CM | POA: Insufficient documentation

## 2011-05-05 DIAGNOSIS — IMO0001 Reserved for inherently not codable concepts without codable children: Secondary | ICD-10-CM | POA: Insufficient documentation

## 2011-05-05 LAB — PREGNANCY, URINE: Preg Test, Ur: NEGATIVE

## 2011-05-05 IMAGING — CR DG ABDOMEN 1V
2 series · 2 of 2 positions shown · non-contrast
Comparison: CT [DATE].

CLINICAL DATA: Pre lithotripsy.

ABDOMEN - 1 VIEW

[t abdomen supine (1 of 2)]
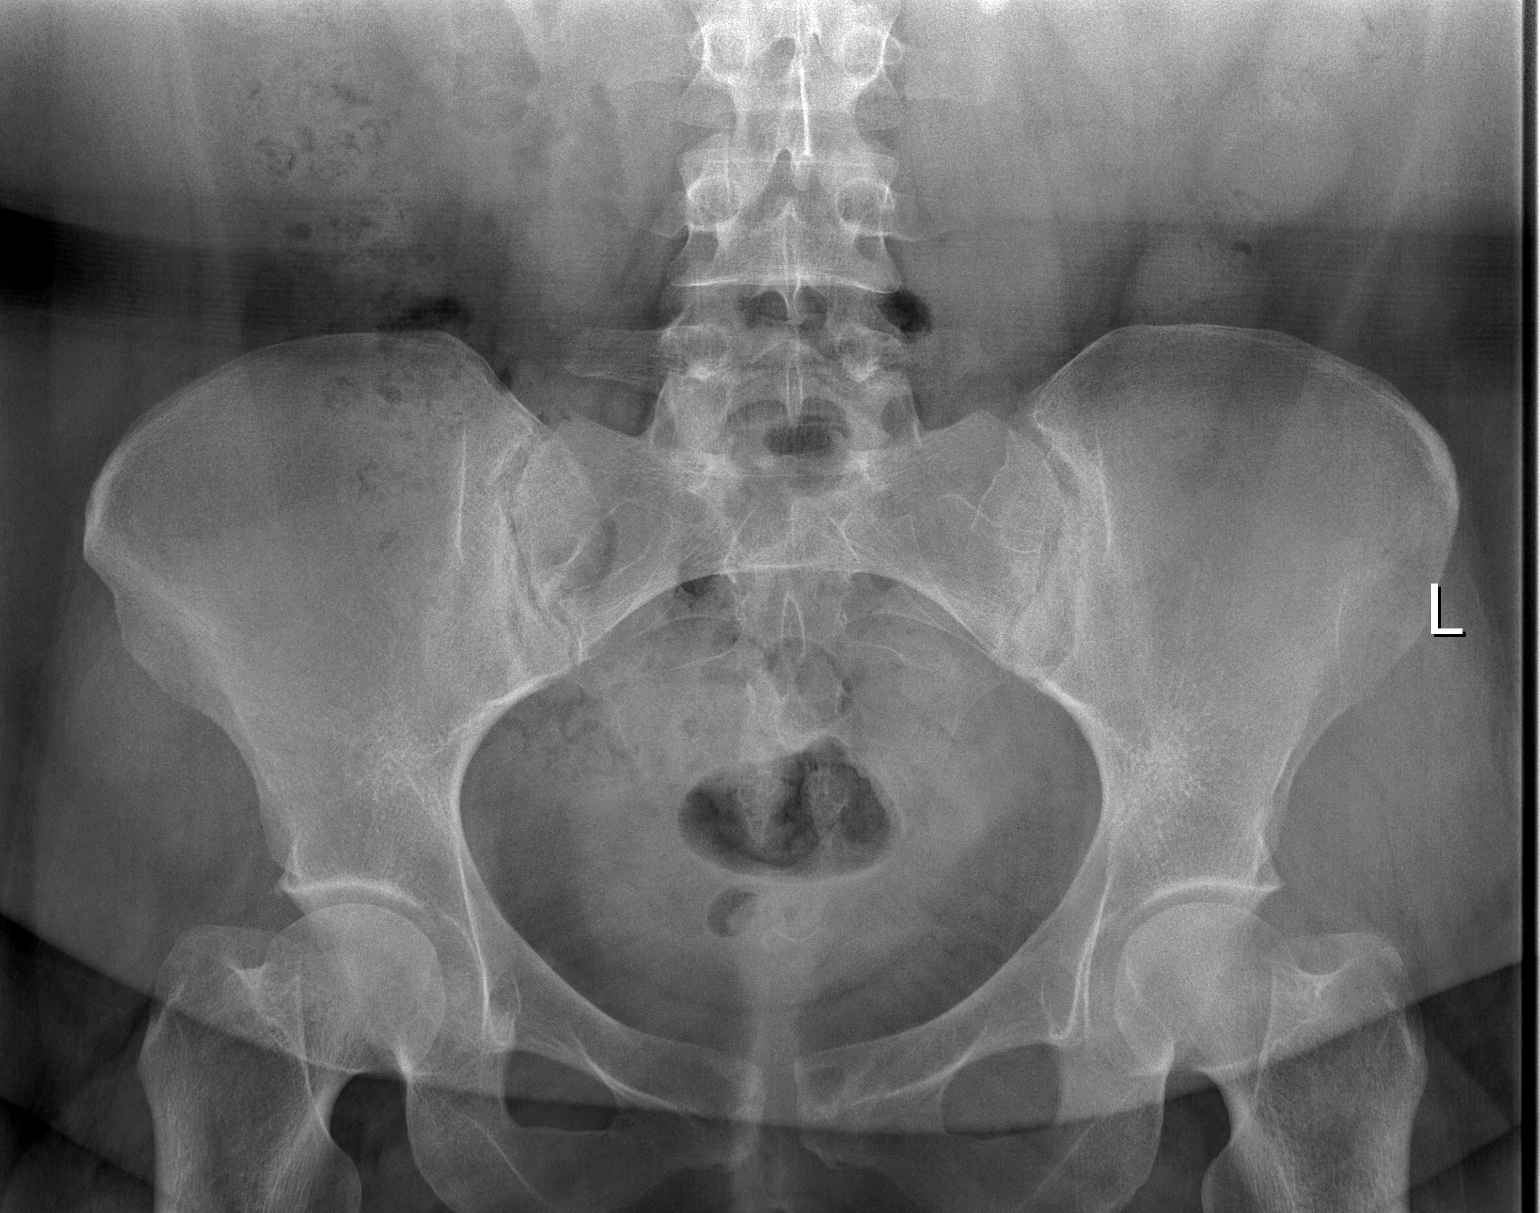

[t abdomen supine (2 of 2)]
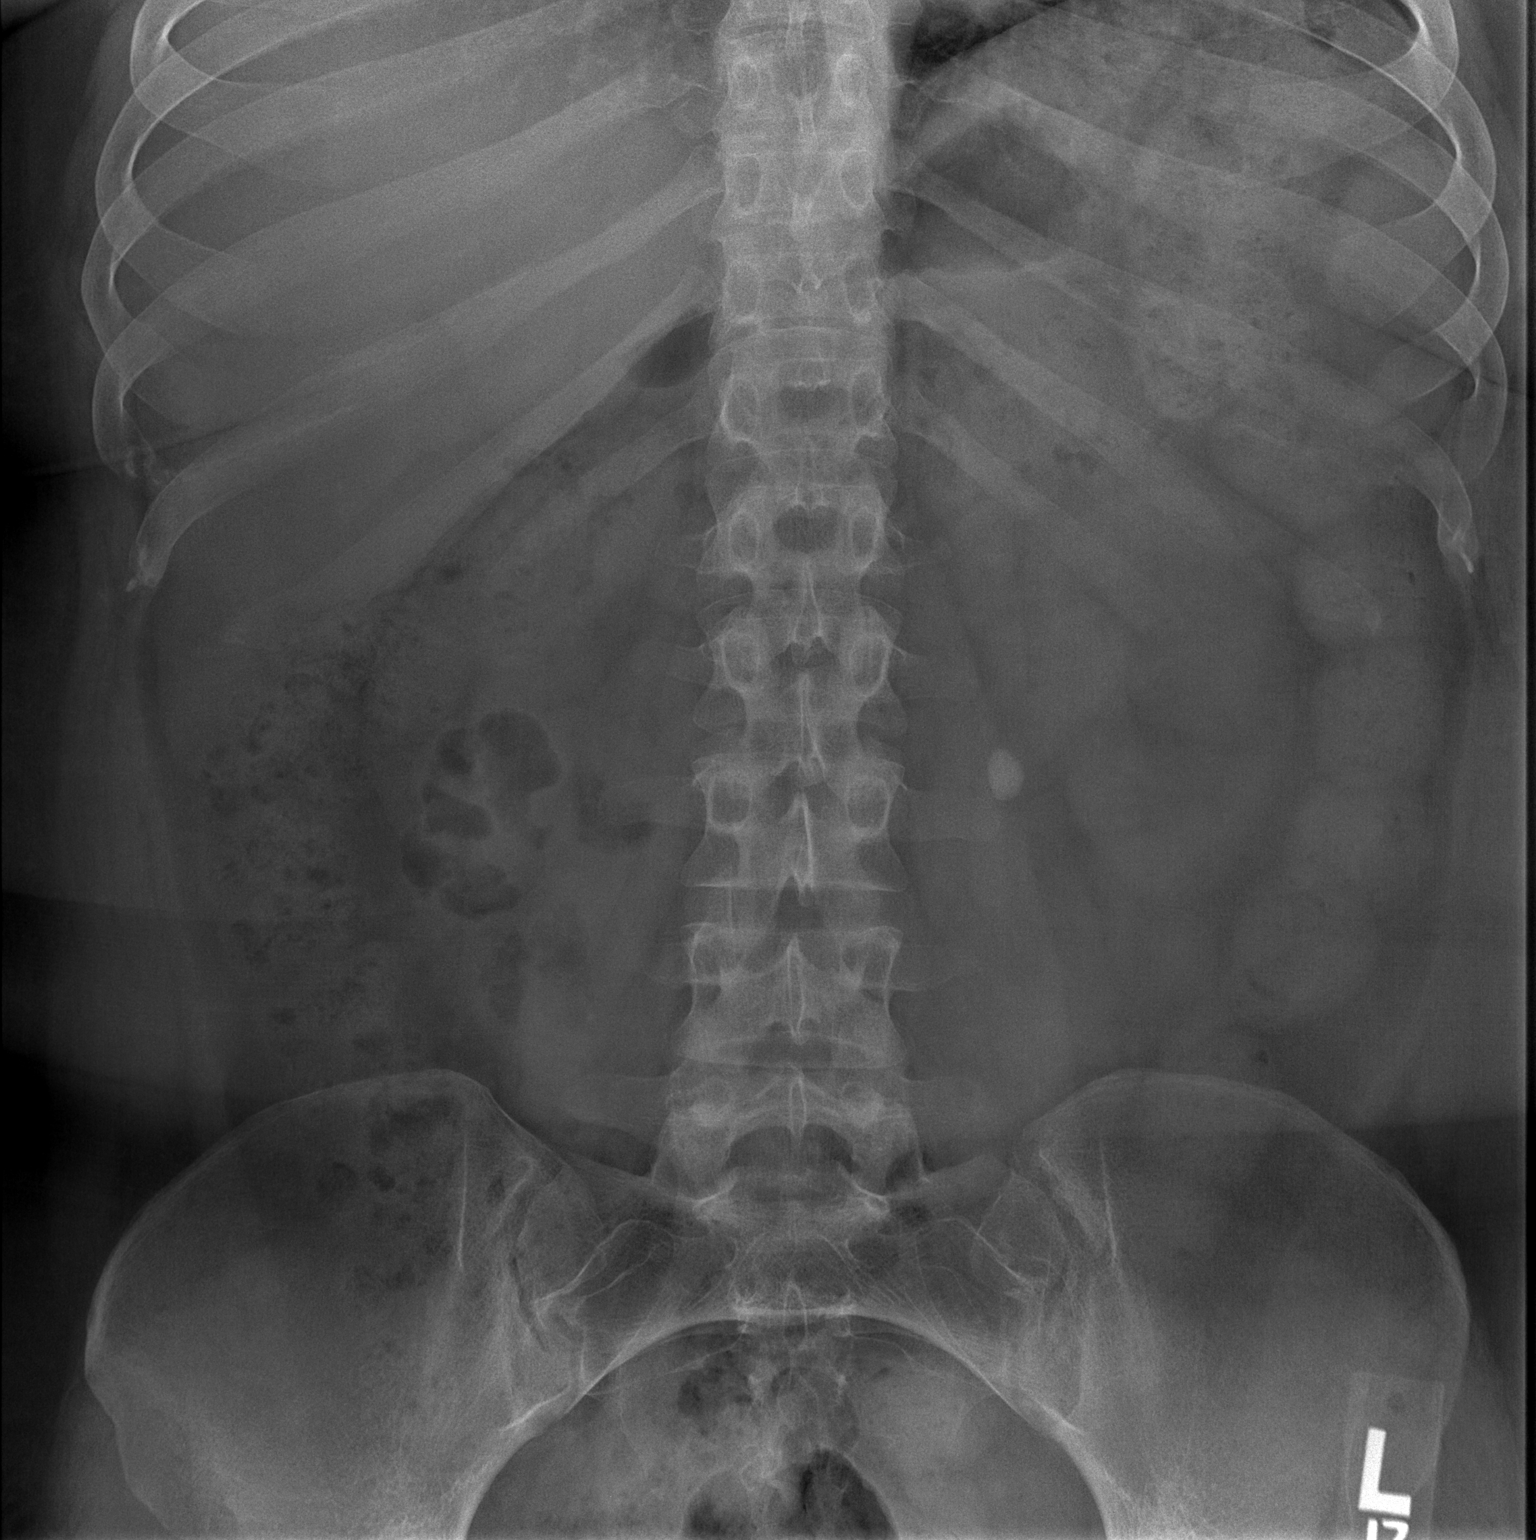

[2 of 2 positions shown; findings below may reference images not displayed]

FINDINGS: A large calculus at the left ureteral pelvic junction
appears unchanged.  Uncorrected for magnification, this measures up
to 14 mm in diameter.  No other calcifications are seen overlying
the kidneys or expected course of the ureters.  The bowel gas
pattern is normal.  The osseous structures appear unchanged.
IMPRESSION: Unchanged position of large calculus at the left ureteral pelvic
junction.

## 2011-05-05 SURGERY — LITHOTRIPSY, ESWL
Anesthesia: LOCAL | Laterality: Left

## 2011-05-05 MED ORDER — DIPHENHYDRAMINE HCL 25 MG PO CAPS
25.0000 mg | ORAL_CAPSULE | ORAL | Status: AC
  Administered 2011-05-05: 25 mg via ORAL

## 2011-05-05 MED ORDER — TAMSULOSIN HCL 0.4 MG PO CAPS: 0.4000 mg | ORAL_CAPSULE | ORAL | Status: DC

## 2011-05-05 MED ORDER — SODIUM CHLORIDE 0.9 % IV SOLN: INTRAVENOUS | Status: DC

## 2011-05-05 MED ORDER — OXYCODONE-ACETAMINOPHEN 10-325 MG PO TABS: 1.0000 | ORAL_TABLET | ORAL | Status: AC | PRN

## 2011-05-05 MED ORDER — DIAZEPAM 5 MG PO TABS
10.0000 mg | ORAL_TABLET | ORAL | Status: AC
  Administered 2011-05-05: 10 mg via ORAL

## 2011-05-05 NOTE — Interval H&P Note (Signed)
History and Physical Interval Note:   05/05/2011   11:39 AM   [name] [name]  has presented today for surgery, with the diagnosis of Left Proximal Ureteral Stone  The various methods of treatment have been discussed with the patient and family. After consideration of risks, benefits and other options for treatment, the patient has consented to  Procedure(s): EXTRACORPOREAL SHOCK WAVE LITHOTRIPSY (ESWL) as a surgical intervention .  The patients' history has been reviewed, patient examined, no change in status, stable for surgery.  I have reviewed the patients' chart and labs.  Questions were answered to the patient's satisfaction.     Garnett Farm  MD

## 2011-05-05 NOTE — Progress Notes (Signed)
Good PO intake no n/v tolerated activity well. Up to BR tolerated activity well.

## 2011-05-05 NOTE — Op Note (Signed)
See [facility] OP note scanned into chart. 

## 2011-05-16 ENCOUNTER — Telehealth: Payer: Self-pay | Admitting: Urology

## 2011-05-24 NOTE — Telephone Encounter (Signed)
Nothing to add

## 2011-05-27 ENCOUNTER — Telehealth (HOSPITAL_COMMUNITY): Payer: Self-pay | Admitting: *Deleted

## 2011-06-12 ENCOUNTER — Telehealth: Payer: Self-pay | Admitting: *Deleted

## 2011-06-12 DIAGNOSIS — Z0000 Encounter for general adult medical examination without abnormal findings: Secondary | ICD-10-CM

## 2011-06-12 NOTE — Telephone Encounter (Signed)
Message copied by Deatra James on Thu Jun 12, 2011  3:42 PM ------      Message from: Newell Coral      Created: Thu Jun 12, 2011  3:36 PM      Regarding: cpe scheduled       This pt has scheduled her cpe and needs lab orders put in.        Thanks!!

## 2011-06-15 ENCOUNTER — Emergency Department (HOSPITAL_COMMUNITY)
Admission: EM | Admit: 2011-06-15 | Discharge: 2011-06-15 | Disposition: A | Discharge status: Home / Self Care | Payer: 59 | Source: Home / Self Care | Attending: Emergency Medicine | Admitting: Emergency Medicine

## 2011-06-15 ENCOUNTER — Encounter (HOSPITAL_COMMUNITY): Payer: Self-pay

## 2011-06-15 DIAGNOSIS — J329 Chronic sinusitis, unspecified: Secondary | ICD-10-CM

## 2011-06-15 MED ORDER — AMOXICILLIN-POT CLAVULANATE 875-125 MG PO TABS: 1.0000 | ORAL_TABLET | Freq: Two times a day (BID) | ORAL | Status: AC

## 2011-06-15 MED ORDER — PSEUDOEPHEDRINE-GUAIFENESIN ER 120-1200 MG PO TB12: 1.0000 | ORAL_TABLET | Freq: Two times a day (BID) | ORAL | Status: DC

## 2011-06-15 MED ORDER — IBUPROFEN 600 MG PO TABS: 600.0000 mg | ORAL_TABLET | Freq: Four times a day (QID) | ORAL | Status: AC | PRN

## 2011-06-15 NOTE — ED Provider Notes (Signed)
History     CSN: [id number]  Arrival date & time 06/15/11  0916   First MD Initiated Contact with Patient 06/15/11 [phone]      Chief Complaint  Patient presents with  . Sinusitis    (Consider location/radiation/quality/duration/timing/severity/associated sxs/prior treatment) HPI Comments: Pt with nasal congestion, postnasal drip, ST, nonproductive cough, bilateral frontal sinus pain/pressure worse with bending forward/lying down x 5 days, ear fullness. Fever tmax 101.6. Also c/o dental pain, purulent nasal d/c. No N/V, other HA, bodyaches,  Taking otc cold meds w/o relief. No known sick contacts. States feels simlar to prev episodes of sinusitis.    Patient is a 25 y.o. female presenting with sinusitis. The history is provided by the patient.  Sinusitis  Associated symptoms include congestion, sinus pressure and sore throat. Pertinent negatives include no ear pain.    Past Medical History  Diagnosis Date  . HYPOTHYROIDISM 04/05/2010  . OBESITY 04/05/2010  . DEPRESSION 04/10/2010  . Anxiety   . Infertility, female   . HYPERTENSION 04/05/2010    PIH with last preg  . Pregnancy induced hypertension   . Anemia     after 1st delivery  . GERD (gastroesophageal reflux disease)     pregnancy related- no meds  . Kidney stones     recurrent  . Sinusitis     Past Surgical History  Procedure Date  . Cesarean section   . Tonsillectomy   . Wisdom tooth extraction   . Tubal ligation     Family History  Problem Relation Age of Onset  . Diabetes Other   . Hyperlipidemia Other   . Hypertension Other   . Hypertension Mother   . Hyperlipidemia Father   . Hypertension Father   . Kidney disease Father   . Cancer Father   . Hypertension Maternal Grandmother   . Diabetes Maternal Grandfather     History  Substance Use Topics  . Smoking status: Never Smoker   . Smokeless tobacco: Never Used  . Alcohol Use: No    OB History    Grav Para Term Preterm Abortions TAB SAB Ect  Mult Living   2 2 2  0 0 0 0 0 0 2      Review of Systems  Constitutional: Positive for fever.  HENT: Positive for congestion, sore throat, postnasal drip and sinus pressure. Negative for ear pain, nosebleeds, rhinorrhea, trouble swallowing and voice change.   Eyes: Negative.   Respiratory: Negative.   Cardiovascular: Negative.   Gastrointestinal: Negative.   Skin: Negative.   Neurological: Positive for headaches.    Allergies  Review of patient's allergies indicates no known allergies.  Home Medications   Current Outpatient Rx  Name Route Sig Dispense Refill  . AMOXICILLIN-POT CLAVULANATE 875-125 MG PO TABS Oral Take 1 tablet by mouth 2 (two) times daily. X 10 days 20 tablet 0  . IBUPROFEN 600 MG PO TABS Oral Take 1 tablet (600 mg total) by mouth every 6 (six) hours as needed for pain. 30 tablet 0  . PRENATAL PLUS 27-1 MG PO TABS Oral Take 1 tablet by mouth daily.     Marland Kitchen PSEUDOEPHEDRINE-GUAIFENESIN [phone] MG PO TB12 Oral Take 1 tablet by mouth 2 (two) times daily. 20 each 0  . TAMSULOSIN HCL 0.4 MG PO CAPS Oral Take 1 capsule (0.4 mg total) by mouth daily after supper. 30 capsule 11  . VENLAFAXINE HCL 75 MG PO CP24  TAKE 1 CAPSULE BY MOUTH ONCE DAILY 30 capsule 3    BP  128/78  Pulse 81  Temp(Src) 98.1 F (36.7 C) (Oral)  Resp 16  SpO2 100%  LMP 06/08/2011  Breastfeeding? No  Physical Exam  Nursing note and vitals reviewed. Constitutional: She is oriented to person, place, and time. She appears well-developed and well-nourished.  HENT:  Head: Normocephalic and atraumatic.  Right Ear: Tympanic membrane and ear canal normal.  Left Ear: Tympanic membrane and ear canal normal.  Nose: Mucosal edema and rhinorrhea present. Right sinus exhibits maxillary sinus tenderness and frontal sinus tenderness. Left sinus exhibits maxillary sinus tenderness and frontal sinus tenderness.  Mouth/Throat: Uvula is midline, oropharynx is clear and moist and mucous membranes are normal. No  oropharyngeal exudate or posterior oropharyngeal erythema.       Purulent nasal d/c  Eyes: Conjunctivae and EOM are normal. Pupils are equal, round, and reactive to light.  Neck: Normal range of motion. Neck supple.  Cardiovascular: Normal rate, regular rhythm and normal heart sounds.   Pulmonary/Chest: Effort normal and breath sounds normal. No respiratory distress. She has no wheezes. She has no rales.  Abdominal: She exhibits no distension. There is no tenderness. There is no rebound and no guarding.  Musculoskeletal: Normal range of motion.  Lymphadenopathy:    She has no cervical adenopathy.  Neurological: She is alert and oriented to person, place, and time.  Skin: Skin is warm and dry. No rash noted.  Psychiatric: She has a normal mood and affect. Her behavior is normal. Judgment and thought content normal.    ED Course  Procedures (including critical care time)  Labs Reviewed - No data to display No results found.   1. Sinusitis       MDM    Luiz Blare, MD 06/15/11 1016

## 2011-06-15 NOTE — ED Notes (Signed)
Pt has sinus pressure with green drainage and has been taking otc meds.

## 2011-07-02 ENCOUNTER — Other Ambulatory Visit (INDEPENDENT_AMBULATORY_CARE_PROVIDER_SITE_OTHER): Payer: 59

## 2011-07-02 DIAGNOSIS — Z0000 Encounter for general adult medical examination without abnormal findings: Secondary | ICD-10-CM

## 2011-07-02 LAB — CBC WITH DIFFERENTIAL/PLATELET
Basophils Absolute: 0 10*3/uL (ref 0.0–0.1)
Eosinophils Absolute: 0.1 10*3/uL (ref 0.0–0.7)
Hemoglobin: 12.9 g/dL (ref 12.0–15.0)
Lymphocytes Relative: 19.5 % (ref 12.0–46.0)
Monocytes Relative: 7.5 % (ref 3.0–12.0)
Neutro Abs: 6.2 10*3/uL (ref 1.4–7.7)
Neutrophils Relative %: 72.1 % (ref 43.0–77.0)
Platelets: 288 10*3/uL (ref 150.0–400.0)
RDW: 14.2 % (ref 11.5–14.6)

## 2011-07-02 LAB — URINALYSIS, ROUTINE W REFLEX MICROSCOPIC
Ketones, ur: NEGATIVE
Specific Gravity, Urine: 1.025 (ref 1.000–1.030)
Urine Glucose: NEGATIVE
Urobilinogen, UA: 0.2 (ref 0.0–1.0)

## 2011-07-03 ENCOUNTER — Other Ambulatory Visit: Payer: Self-pay | Admitting: Internal Medicine

## 2011-07-03 LAB — TSH: TSH: 0.07 u[IU]/mL — ABNORMAL LOW (ref 0.35–5.50)

## 2011-07-03 LAB — BASIC METABOLIC PANEL
CO2: 25 mEq/L (ref 19–32)
Calcium: 9.2 mg/dL (ref 8.4–10.5)
Creatinine, Ser: 0.6 mg/dL (ref 0.4–1.2)
Glucose, Bld: 76 mg/dL (ref 70–99)
Sodium: 141 mEq/L (ref 135–145)

## 2011-07-03 LAB — LIPID PANEL
HDL: 54 mg/dL (ref >39.00)
Triglycerides: 134 mg/dL (ref 0.0–149.0)

## 2011-07-03 LAB — HEPATIC FUNCTION PANEL
AST: 24 U/L (ref 0–37)
Alkaline Phosphatase: 133 U/L — ABNORMAL HIGH (ref 39–117)
Bilirubin, Direct: 0 mg/dL (ref 0.0–0.3)
Total Bilirubin: 0.3 mg/dL (ref 0.3–1.2)

## 2011-07-03 MED ORDER — CIPROFLOXACIN HCL 500 MG PO TABS: 500.0000 mg | ORAL_TABLET | Freq: Two times a day (BID) | ORAL | Status: AC

## 2011-07-09 ENCOUNTER — Encounter: Payer: Self-pay | Admitting: Internal Medicine

## 2011-07-09 ENCOUNTER — Ambulatory Visit (INDEPENDENT_AMBULATORY_CARE_PROVIDER_SITE_OTHER): Payer: 59 | Admitting: Internal Medicine

## 2011-07-09 VITALS — BP 120/82 | HR 86 | Temp 96.8°F | Ht 63.0 in | Wt 280.8 lb

## 2011-07-09 DIAGNOSIS — Z0000 Encounter for general adult medical examination without abnormal findings: Secondary | ICD-10-CM

## 2011-07-09 DIAGNOSIS — E669 Obesity, unspecified: Secondary | ICD-10-CM

## 2011-07-09 DIAGNOSIS — E039 Hypothyroidism, unspecified: Secondary | ICD-10-CM

## 2011-07-09 DIAGNOSIS — F329 Major depressive disorder, single episode, unspecified: Secondary | ICD-10-CM

## 2011-07-09 MED ORDER — VENLAFAXINE HCL ER 75 MG PO CP24: 75.0000 mg | ORAL_CAPSULE | Freq: Every day | ORAL | Status: DC

## 2011-07-09 MED ORDER — PHENTERMINE HCL 37.5 MG PO CAPS: 37.5000 mg | ORAL_CAPSULE | ORAL | Status: DC

## 2011-07-09 NOTE — Progress Notes (Signed)
Subjective:    Patient ID: [name] [name], female    DOB: [birth year], 26 y.o.   MRN: [id number]  HPI patient is here today for annual physical. Patient feels well overall.  UTI symptoms -Did not start Cipro abx for UTI until today -  + dysuria symptoms since 04/2011 c-section, also kidney stone tx 04/2011  Obesity - requests rx for weight watchers and ?phentermine trial - never on same before - not breast feeding - lap band on hold at this time  Hypothyroid - the patient reports compliance with medication(s) as prescribed. Denies adverse side effects.   Depression - no symptom exacerbation postpartum - the patient reports compliance with medication(s) as prescribed. Denies adverse side effects.  Past Medical History  Diagnosis Date  . HYPOTHYROIDISM   . OBESITY   . DEPRESSION   . Anxiety   . Infertility, female hx   . Pregnancy induced hypertension   . Anemia     after 1st delivery  . GERD (gastroesophageal reflux disease)     pregnancy related- no meds  . Kidney stones     recurrent   Family History  Problem Relation Age of Onset  . Diabetes Other   . Hyperlipidemia Other   . Hypertension Other   . Hypertension Mother   . Hyperlipidemia Father   . Hypertension Father   . Kidney disease Father   . Cancer Father   . Hypertension Maternal Grandmother   . Diabetes Maternal Grandfather    History  Substance Use Topics  . Smoking status: Never Smoker   . Smokeless tobacco: Never Used  . Alcohol Use: No  working [place] part time since 06/2011 following 2nd child delivery 04/2011  Review of Systems Constitutional: Negative for fever or unexpected weight change.  Respiratory: Negative for cough and shortness of breath.   Cardiovascular: Negative for chest pain or palpitations.  Gastrointestinal: Negative for abdominal pain, no bowel changes.  Musculoskeletal: Negative for gait problem or joint swelling.  Skin: Negative for rash.  Neurological: Negative for  dizziness or headache.  No other specific complaints in a complete review of systems (except as listed in HPI above).     Objective:   Physical Exam BP 120/82  Pulse 86  Temp(Src) 96.8 F (36 C) (Oral)  Ht 5\' 3"  (1.6 m)  Wt 280 lb 12.8 oz (127.37 kg)  BMI 49.74 kg/m2  SpO2 98%  LMP 06/08/2011 Wt Readings from Last 3 Encounters:  07/09/11 280 lb 12.8 oz (127.37 kg)  04/22/11 267 lb (121.11 kg)  03/27/11 280 lb (127.007 kg)   Constitutional: She is obese, but appears well-developed and well-nourished. No distress.  HENT: Head: Normocephalic and atraumatic. Ears: B TMs ok, no erythema or effusion; Nose: Nose normal.  Mouth/Throat: Oropharynx is clear and moist. No oropharyngeal exudate.  Eyes: Conjunctivae and EOM are normal. Pupils are equal, round, and reactive to light. No scleral icterus.  Neck: Normal range of motion. Neck supple. No JVD present. No thyromegaly present.  Cardiovascular: Normal rate, regular rhythm and normal heart sounds.  No murmur heard. No BLE edema. Pulmonary/Chest: Effort normal and breath sounds normal. No respiratory distress. She has no wheezes.  Abdominal: Soft. Bowel sounds are normal. She exhibits no distension. There is no tenderness. no masses Musculoskeletal: Normal range of motion, no joint effusions. No gross deformities Neurological: She is alert and oriented to person, place, and time. No cranial nerve deficit. Coordination normal.  Skin: Skin is warm and dry. No rash noted. No  erythema.  Psychiatric: She has a normal mood and affect. Her behavior is normal. Judgment and thought content normal.   Lab Results  Component Value Date   WBC 8.6 07/02/2011   HGB 12.9 07/02/2011   HCT 37.8 07/02/2011   PLT 288.0 07/02/2011   GLUCOSE 76 07/02/2011   CHOL 189 07/02/2011   TRIG 134.0 07/02/2011   HDL 54.00 07/02/2011   LDLDIRECT 140.3 04/03/2010   LDLCALC 108* 07/02/2011   ALT 23 07/02/2011   AST 24 07/02/2011   NA 141 07/02/2011   K 4.4 07/02/2011   CL  104 07/02/2011   CREATININE 0.6 07/02/2011   BUN 14 07/02/2011   CO2 25 07/02/2011   TSH 0.07* 07/02/2011        Assessment & Plan:  CPX - v70.0 - Patient has been counseled on age-appropriate routine health concerns for screening and prevention. These are reviewed and up-to-date. Immunizations are up-to-date or declined. Labs  reviewed.

## 2011-07-09 NOTE — Assessment & Plan Note (Signed)
Slightly suppressed but asymptomatic - recheck in 3 months, sooner if symptoms - no dose change today Lab Results  Component Value Date   TSH 0.07* 07/02/2011

## 2011-07-09 NOTE — Patient Instructions (Signed)
It was good to see you today. Health Maintenance reviewed - everything recommended is up to date. Ok to try phenetamine for 30days - new prescription provided to you today - will need office visit for medication review and weight recheck prior to refills being provided - no more than 3 consecutive months will be given in 1 year -   Written prescription given to you for weight watchers as discussed - Other Medications reviewed, no changes at this time. Refill on medication(s) as discussed today.  Treatment of UTI with cipro as discussed - let us know if recurrent or continued problems with uti symptoms  Please schedule followup 3 months for TSH (thyroid labs check only) and annually for physical and labs, call sooner if problems.

## 2011-07-09 NOTE — Assessment & Plan Note (Signed)
Wt Readings from Last 3 Encounters:  07/09/11 280 lb 12.8 oz (127.37 kg)  04/22/11 267 lb (121.11 kg)  03/27/11 280 lb (127.007 kg)    6 of 6 OV done 08/2010 for lap band consideration surg sched will be on hold -s/p 2nd preg delivery 04/2011  rx for weight watchers and phentermine x 30d done today - no refills without recheck weight and review

## 2011-07-09 NOTE — Assessment & Plan Note (Signed)
On SNRI - doing well - rx for 90d as requested

## 2012-05-27 ENCOUNTER — Encounter: Payer: Self-pay | Admitting: *Deleted

## 2012-05-27 ENCOUNTER — Ambulatory Visit (INDEPENDENT_AMBULATORY_CARE_PROVIDER_SITE_OTHER): Payer: 59 | Admitting: Internal Medicine

## 2012-05-27 ENCOUNTER — Encounter: Payer: Self-pay | Admitting: Internal Medicine

## 2012-05-27 VITALS — BP 122/82 | HR 80 | Temp 98.1°F | Ht 63.0 in | Wt 290.4 lb

## 2012-05-27 DIAGNOSIS — E669 Obesity, unspecified: Secondary | ICD-10-CM

## 2012-05-27 DIAGNOSIS — F329 Major depressive disorder, single episode, unspecified: Secondary | ICD-10-CM

## 2012-05-27 DIAGNOSIS — G4700 Insomnia, unspecified: Secondary | ICD-10-CM

## 2012-05-27 MED ORDER — ZOLPIDEM TARTRATE 10 MG PO TABS: 10.0000 mg | ORAL_TABLET | Freq: Every evening | ORAL | Status: DC | PRN

## 2012-05-27 MED ORDER — VENLAFAXINE HCL ER 75 MG PO CP24: 75.0000 mg | ORAL_CAPSULE | Freq: Every day | ORAL | Status: DC

## 2012-05-27 NOTE — Patient Instructions (Signed)
It was good to see you today. Use Ambien as needed for insomnia - Your prescription(s) have been submitted to your pharmacy. Please take as directed and contact our office if you believe you are having problem(s) with the medication(s). Other Medications reviewed, no changes at this time. Refill on medication(s) as discussed today.  Paperwork for bariatric approval completed and letter to be generated - will fax and notify you once ccomplete Please schedule followup in 3-4 months for medical phyical and labs, call sooner if problems.

## 2012-05-27 NOTE — Assessment & Plan Note (Signed)
Wt Readings from Last 3 Encounters:  05/27/12 290 lb 6.4 oz (131.725 kg)  07/09/11 280 lb 12.8 oz (127.37 kg)  04/22/11 267 lb (121.11 kg)    6 of 6 OV done 08/2010 for lap band consideration surg sched will be on hold -s/p 2nd preg delivery 04/2011  rx for weight watchers and phentermine x 30d done 06/2011 Paperwork for bariatric clinic done today

## 2012-05-27 NOTE — Progress Notes (Signed)
  Subjective:    Patient ID: [name] [name], female    DOB: December 17, 1985, 26 y.o.   MRN: [id number]  HPI  patient is here today for paperwork to enroll in bariatric clinic for surgical intervention Hypothyroid - the patient reports compliance with medication(s) as prescribed. Denies adverse side effects.   Depression - no symptom exacerbation postpartum - the patient reports compliance with medication(s) as prescribed. Denies adverse side effects.  Past Medical History  Diagnosis Date  . HYPOTHYROIDISM   . OBESITY   . DEPRESSION   . Anxiety   . Infertility, female hx   . Pregnancy induced hypertension   . Anemia     after 1st delivery  . GERD (gastroesophageal reflux disease)     pregnancy related- no meds  . Kidney stones     recurrent  . PCOS (polycystic ovarian syndrome)    Family History  Problem Relation Age of Onset  . Diabetes Other   . Hyperlipidemia Other   . Hypertension Other   . Hypertension Mother   . Hyperlipidemia Father   . Hypertension Father   . Kidney disease Father   . Cancer Father   . Hypertension Maternal Grandmother   . Diabetes Maternal Grandfather    History  Substance Use Topics  . Smoking status: Never Smoker   . Smokeless tobacco: Never Used  . Alcohol Use: No  working [employer] part time since 06/2011 following 2nd child delivery 04/2011  Review of Systems  Constitutional: Negative for fever or unexpected weight change.  Respiratory: Negative for cough and shortness of breath.   Cardiovascular: Negative for chest pain or palpitations.      Objective:   Physical Exam  BP 122/82  Pulse 80  Temp 98.1 F (36.7 C) (Oral)  Ht 5\' 3"  (1.6 m)  Wt 290 lb 6.4 oz (131.725 kg)  BMI 51.44 kg/m2  SpO2 99% Wt Readings from Last 3 Encounters:  05/27/12 290 lb 6.4 oz (131.725 kg)  07/09/11 280 lb 12.8 oz (127.37 kg)  04/22/11 267 lb (121.11 kg)   Constitutional: She is obese, but appears well-developed and well-nourished. No  distress.  Neck: Normal range of motion. Neck supple. No JVD present. No thyromegaly present.  Cardiovascular: Normal rate, regular rhythm and normal heart sounds.  No murmur heard. No BLE edema. Pulmonary/Chest: Effort normal and breath sounds normal. No respiratory distress. She has no wheezes.  Psychiatric: She has a normal mood and affect. Her behavior is normal. Judgment and thought content normal.   Lab Results  Component Value Date   WBC 8.6 07/02/2011   HGB 12.9 07/02/2011   HCT 37.8 07/02/2011   PLT 288.0 07/02/2011   GLUCOSE 76 07/02/2011   CHOL 189 07/02/2011   TRIG 134.0 07/02/2011   HDL 54.00 07/02/2011   LDLDIRECT 140.3 04/03/2010   LDLCALC 108* 07/02/2011   ALT 23 07/02/2011   AST 24 07/02/2011   NA 141 07/02/2011   K 4.4 07/02/2011   CL 104 07/02/2011   CREATININE 0.6 07/02/2011   BUN 14 07/02/2011   CO2 25 07/02/2011   TSH 0.07* 07/02/2011        Assessment & Plan:   See problem list. Medications and labs reviewed today. Time spent with pt/family today 25 minutes, greater than 50% time spent counseling patient on weight and paperwork completion. Also review of prior records

## 2012-05-27 NOTE — Assessment & Plan Note (Signed)
On SNRI - doing well - rx for 90d as requested The current medical regimen is effective;  continue present plan and medications.

## 2012-06-23 ENCOUNTER — Encounter (INDEPENDENT_AMBULATORY_CARE_PROVIDER_SITE_OTHER): Payer: Self-pay | Admitting: General Surgery

## 2012-06-23 ENCOUNTER — Ambulatory Visit (INDEPENDENT_AMBULATORY_CARE_PROVIDER_SITE_OTHER): Payer: 59 | Admitting: General Surgery

## 2012-06-23 VITALS — BP 124/86 | HR 74 | Temp 98.1°F | Resp 18 | Ht 63.0 in | Wt 286.2 lb

## 2012-06-23 DIAGNOSIS — Z6841 Body Mass Index (BMI) 40.0 and over, adult: Secondary | ICD-10-CM

## 2012-06-23 LAB — CBC WITH DIFFERENTIAL/PLATELET
Basophils Absolute: 0 10*3/uL (ref 0.0–0.1)
Basophils Relative: 0 % (ref 0–1)
Eosinophils Relative: 1 % (ref 0–5)
HCT: 39 % (ref 36.0–46.0)
Hemoglobin: 13.1 g/dL (ref 12.0–15.0)
Lymphocytes Relative: 26 % (ref 12–46)
MCHC: 33.6 g/dL (ref 30.0–36.0)
MCV: 80.4 fL (ref 78.0–100.0)
Monocytes Absolute: 0.6 10*3/uL (ref 0.1–1.0)
Monocytes Relative: 7 % (ref 3–12)
RBC: 4.85 MIL/uL (ref 3.87–5.11)
RDW: 14.4 % (ref 11.5–15.5)
WBC: 8.4 10*3/uL (ref 4.0–10.5)

## 2012-06-23 LAB — COMPREHENSIVE METABOLIC PANEL
AST: 19 U/L (ref 0–37)
Albumin: 4.3 g/dL (ref 3.5–5.2)
Alkaline Phosphatase: 143 U/L — ABNORMAL HIGH (ref 39–117)
BUN: 13 mg/dL (ref 6–23)
Calcium: 9.5 mg/dL (ref 8.4–10.5)
Chloride: 105 mEq/L (ref 96–112)
Glucose, Bld: 80 mg/dL (ref 70–99)
Potassium: 4.4 mEq/L (ref 3.5–5.3)
Sodium: 139 mEq/L (ref 135–145)
Total Protein: 7 g/dL (ref 6.0–8.3)

## 2012-06-23 LAB — LIPID PANEL
Cholesterol: 194 mg/dL (ref 0–200)
Total CHOL/HDL Ratio: 4.3 Ratio
VLDL: 30 mg/dL (ref 0–40)

## 2012-06-23 NOTE — Patient Instructions (Signed)
We will start our evaluation process. In the interim, please try to increase your exercise - such as daily walking

## 2012-06-23 NOTE — Progress Notes (Signed)
Patient ID: [name] [name], female   DOB: February 06, 1986, 27 y.o.   MRN: [id number]  Chief Complaint  Patient presents with  . Bariatric Pre-op    HPI [name] is a 27 y.o. female.   HPI 27 yo morbidly obese WF referred by Dr Felicity Coyer for evaluation of weight loss surgery, specifically gastric bypass.  The patient states that she is from of all of her life with her weight. Despite numerous attempts for sustained weight loss she has been unsuccessful. She has tried the [employer], Weight Watchers, Slim fast, phentermine, as well as curves all without any long-term success. She initially had fertility issues while trying to get pregnant several years ago. Her pregnancies were also complicated by hypertension due to her obesity. 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  She has attended 2 weight loss seminars and has done extensive research regarding gastric bypass surgery. Moreover she has had numerous family members who have undergone laparoscopic Roux-en-Y gastric bypass. Her mother, uncle, and, and another uncle all have had gastric bypass surgery and have done well.   Past Medical History  Diagnosis Date  . HYPOTHYROIDISM   . OBESITY   . DEPRESSION   . Anxiety   . Infertility, female hx   . Pregnancy induced hypertension   . Anemia     after 1st delivery  . GERD (gastroesophageal reflux disease)     pregnancy related- no meds  . Kidney stones     recurrent  . PCOS (polycystic ovarian syndrome)     Past Surgical History  Procedure Date  . Cesarean section   . Tonsillectomy   . Wisdom tooth extraction   . Tubal ligation     Family History  Problem Relation Age of Onset  . Diabetes Other   . Hyperlipidemia Other   . Hypertension Other   . Hypertension Mother   . Hyperlipidemia Father   . Hypertension Father   . Kidney disease Father   . Cancer Father   . Hypertension Maternal Grandmother   . Diabetes Maternal Grandfather     Social  History History  Substance Use Topics  . Smoking status: Never Smoker   . Smokeless tobacco: Never Used  . Alcohol Use: No    No Known Allergies  Current Outpatient Prescriptions  Medication Sig Dispense Refill  . diphenhydrAMINE (SOMINEX) 25 MG tablet Take 75 mg by mouth at bedtime.      Marland Kitchen venlafaxine XR (EFFEXOR-XR) 75 MG 24 hr capsule Take 1 capsule (75 mg total) by mouth daily.  90 capsule  3  . zolpidem (AMBIEN) 10 MG tablet Take 1 tablet (10 mg total) by mouth at bedtime as needed for sleep.  30 tablet  1   No current facility-administered medications for this visit.   Facility-Administered Medications Ordered in Other Visits  Medication Dose Route Frequency Provider Last Rate Last Dose  . ceFAZolin (ANCEF) injection 2 g  2 g Intramuscular Once         Review of Systems Review of Systems  Constitutional: Negative for fever, activity change, appetite change, fatigue and unexpected weight change.  HENT: Negative for hearing loss, nosebleeds, congestion, neck pain and neck stiffness.   Eyes: Negative for photophobia and visual disturbance.  Respiratory: Negative for apnea, cough, chest tightness, shortness of breath, wheezing and stridor.   Cardiovascular: Negative for chest pain, palpitations and leg swelling.       Denies CP, SOB, orthopnea, PND  Gastrointestinal: Negative for nausea,  vomiting, abdominal pain, diarrhea, blood in stool and abdominal distention.       Some heartburn on occasion with certain foods. BM every other day. Doesn't take reflux meds  Genitourinary: Negative for dysuria, urgency, frequency, flank pain, vaginal bleeding, difficulty urinating and pelvic pain.       G2P2; had fertility issues initially; has had some irregular periods. S/p tubal  Musculoskeletal: Negative for back pain, arthralgias and gait problem.  Neurological: Negative for tremors, seizures, syncope, light-headedness, numbness and headaches.  Hematological: Negative for adenopathy.  Does not bruise/bleed easily.    Blood pressure 124/86, pulse 74, temperature 98.1 F (36.7 C), temperature source Temporal, resp. rate 18, height 5\' 3"  (1.6 m), weight 286 lb 4 oz (129.842 kg).  Physical Exam Physical Exam  Vitals reviewed. Constitutional: She is oriented to person, place, and time. She appears well-developed and well-nourished. No distress.       Morbidly obese  HENT:  Head: Normocephalic and atraumatic.  Right Ear: External ear normal.  Left Ear: External ear normal.  Eyes: Conjunctivae normal are normal. No scleral icterus.  Neck: Normal range of motion. Neck supple. No tracheal deviation present. No thyromegaly present.  Cardiovascular: Normal rate, regular rhythm and intact distal pulses.   No murmur heard. Pulmonary/Chest: Effort normal and breath sounds normal. No respiratory distress. She has no wheezes.  Abdominal: Soft. Bowel sounds are normal. She exhibits no distension. There is no tenderness. There is no rebound. No hernia. Hernia confirmed negative in the ventral area.         A little skin rash under pannus  Musculoskeletal: Normal range of motion. She exhibits no edema and no tenderness.  Lymphadenopathy:    She has no cervical adenopathy.  Neurological: She is alert and oriented to person, place, and time. She exhibits normal muscle tone.  Skin: Skin is warm and dry. She is not diaphoretic.  Psychiatric: She has a normal mood and affect. Her behavior is normal. Judgment and thought content normal.    Data Reviewed Dr Diamantina Monks note   Assessment    Morbid obesity BMI 50.7 PCOS GERD H/o hypothyroidism Depression H/o pregnancy induced hypertension    Plan    The patient meets weight loss surgery criteria. I think the patient would be an acceptable candidate for Laparoscopic Roux-en-Y Gastric bypass.   We discussed laparoscopic Roux-en-Y gastric bypass. We discussed the preoperative, operative and postoperative process. Using diagrams,  I explained the surgery in detail including the performance of an EGD near the end of the surgery and an Upper GI swallow study on POD 1. We discussed the typical hospital course including a 2-3 day stay baring any complications.   The patient was given educational material. I quoted the patient that they can expect to lose 50-70% of their excess weight with the gastric bypass. We did discuss the possibility of weight regain several years after the procedure.  We discussed the risk and benefits of surgery including but not limited to anesthesia risk, bleeding, infection, anastomotic edema requiring a few additional days in the hospital, postop nausea, blood clot formation, anastomotic leak, anastomotic stricture, ulcer formation, death, respiratory complications, intestinal blockage, internal hernia, gallstone formation, vitamin and nutritional deficiencies, injury to surrounding structures, failure to lose weight and mood changes.  We discussed that before and after surgery that there would be an alteration in their diet. I explained that we have put them on a diet 2 weeks before surgery. I also explained that they would be on a  liquid diet for 2 weeks after surgery. We discussed that they would have to avoid certain foods such as sugar after surgery. We discussed the importance of physical activity as well as compliance with our dietary and supplement recommendations and routine follow-up.  I explained to the patient that we will start our evaluation process which includes labs, Upper GI to evaluate stomach and swallowing anatomy, nutritionist consultation, psychiatrist consultation, EKG, CXR, abdominal ultrasound.  [name] Andrey Campanile, MD, FACS General, Bariatric, & Minimally Invasive Surgery [facility], [place]          [facility] M 06/23/2012, 9:32 AM

## 2012-06-24 ENCOUNTER — Telehealth (INDEPENDENT_AMBULATORY_CARE_PROVIDER_SITE_OTHER): Payer: Self-pay | Admitting: General Surgery

## 2012-06-24 LAB — H. PYLORI ANTIBODY, IGG: H Pylori IgG: 0.69 {ISR}

## 2012-06-24 NOTE — Telephone Encounter (Signed)
Labs faxed to Dr Felicity Coyer.

## 2012-06-24 NOTE — Telephone Encounter (Signed)
Message copied by Liliana Cline on Thu Jun 24, 2012  8:34 AM ------      Message from: Andrey Campanile, ERIC M      Created: Thu Jun 24, 2012  8:25 AM       pls forward to PCP for their records

## 2012-06-28 ENCOUNTER — Other Ambulatory Visit: Payer: Self-pay

## 2012-06-28 ENCOUNTER — Ambulatory Visit (HOSPITAL_COMMUNITY)
Admission: RE | Admit: 2012-06-28 | Discharge: 2012-06-28 | Disposition: A | Discharge status: Home / Self Care | Payer: 59 | Source: Ambulatory Visit | Attending: General Surgery | Admitting: General Surgery

## 2012-06-28 ENCOUNTER — Ambulatory Visit (HOSPITAL_COMMUNITY): Admission: RE | Admit: 2012-06-28 | Payer: 59 | Source: Ambulatory Visit

## 2012-06-28 ENCOUNTER — Other Ambulatory Visit (INDEPENDENT_AMBULATORY_CARE_PROVIDER_SITE_OTHER): Payer: Self-pay | Admitting: General Surgery

## 2012-06-28 DIAGNOSIS — E039 Hypothyroidism, unspecified: Secondary | ICD-10-CM | POA: Insufficient documentation

## 2012-06-28 DIAGNOSIS — N200 Calculus of kidney: Secondary | ICD-10-CM | POA: Insufficient documentation

## 2012-06-28 DIAGNOSIS — F3289 Other specified depressive episodes: Secondary | ICD-10-CM | POA: Insufficient documentation

## 2012-06-28 DIAGNOSIS — Z01818 Encounter for other preprocedural examination: Secondary | ICD-10-CM | POA: Insufficient documentation

## 2012-06-28 DIAGNOSIS — K219 Gastro-esophageal reflux disease without esophagitis: Secondary | ICD-10-CM | POA: Insufficient documentation

## 2012-06-28 DIAGNOSIS — Z6841 Body Mass Index (BMI) 40.0 and over, adult: Secondary | ICD-10-CM | POA: Insufficient documentation

## 2012-06-28 DIAGNOSIS — E282 Polycystic ovarian syndrome: Secondary | ICD-10-CM | POA: Insufficient documentation

## 2012-06-28 DIAGNOSIS — K7689 Other specified diseases of liver: Secondary | ICD-10-CM | POA: Insufficient documentation

## 2012-06-28 DIAGNOSIS — F329 Major depressive disorder, single episode, unspecified: Secondary | ICD-10-CM | POA: Insufficient documentation

## 2012-06-28 IMAGING — US US ABDOMEN COMPLETE
1 series · 14 of 25 positions shown · non-contrast
Comparison: CT abdomen pelvis [DATE] and renal ultrasound
[DATE].

CLINICAL DATA: Preop bariatric surgery.  Morbid obesity.

COMPLETE ABDOMINAL ULTRASOUND

[Series 1: us abdomen complete · 14 of 119 slices shown]
[im 1/119]
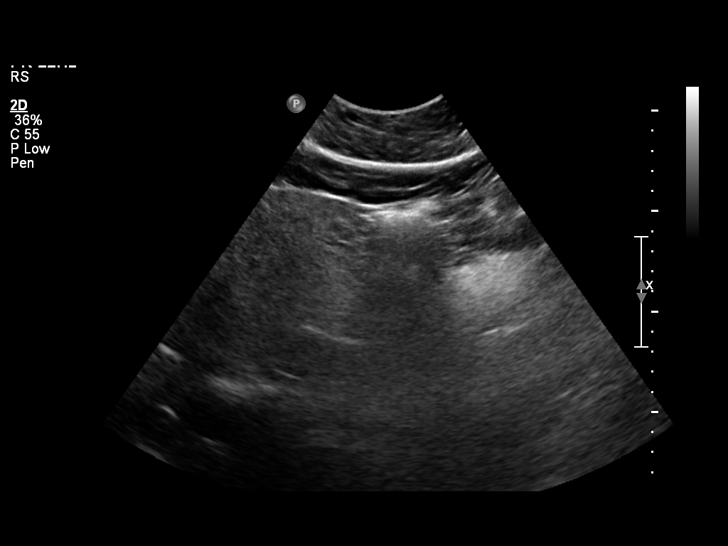
[im 10/119]
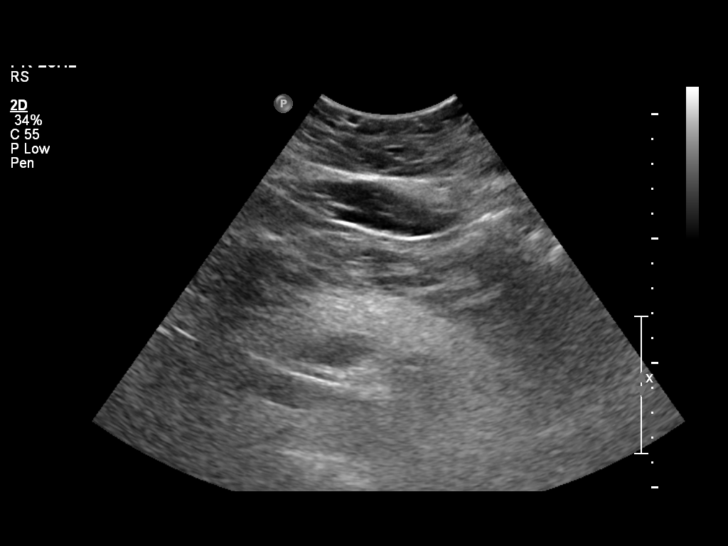
[im 20/119]
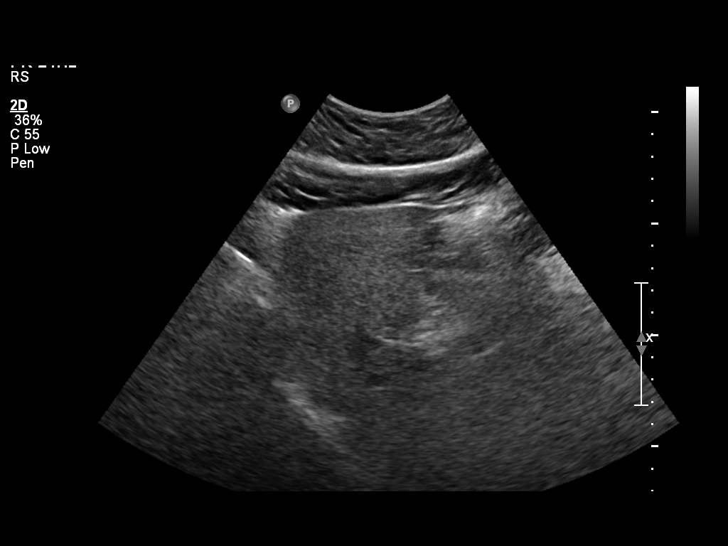
[im 30/119]
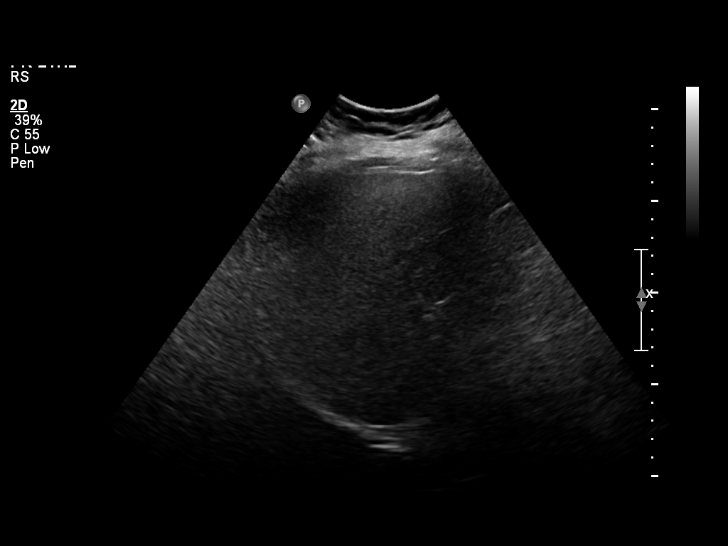
[im 40/119]
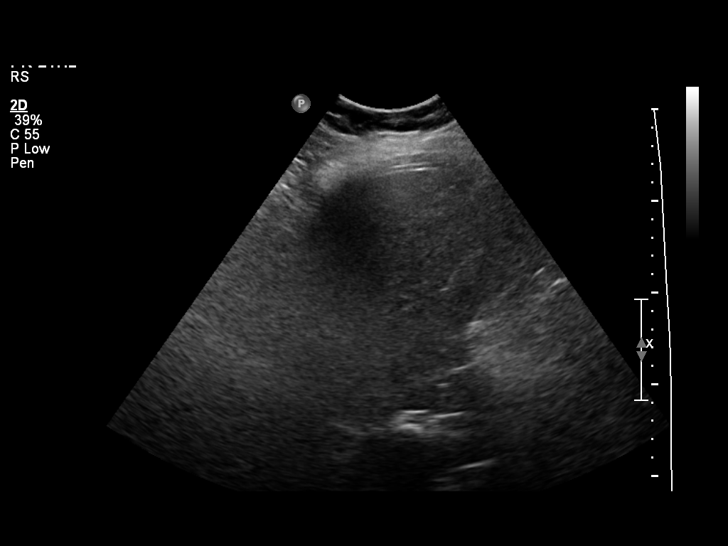
[im 45/119]
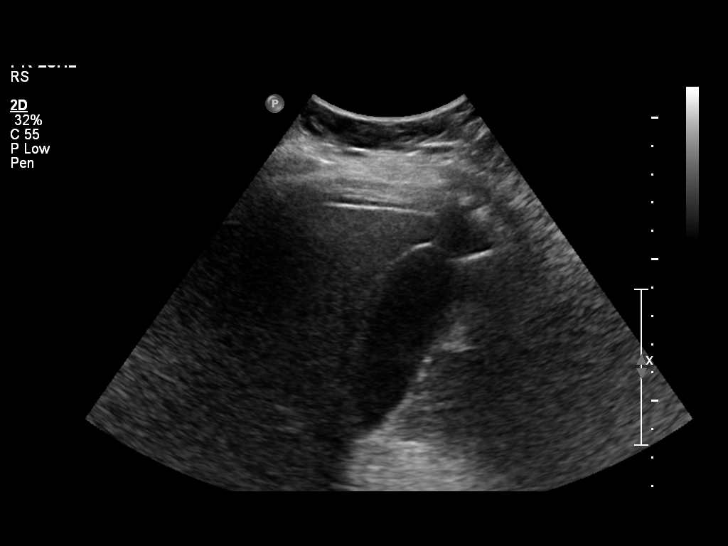
[im 55/119]
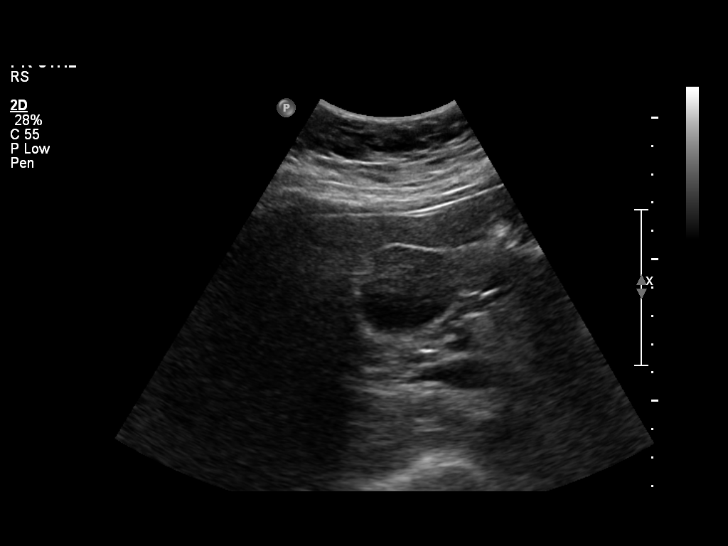
[im 64/119]
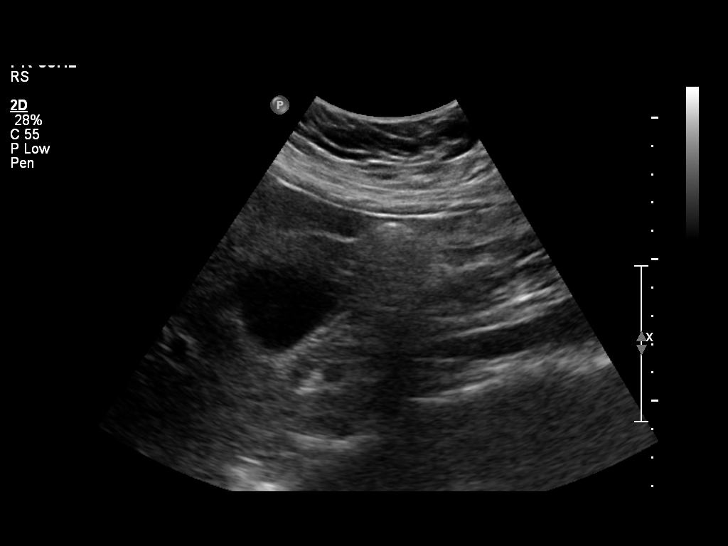
[im 74/119]
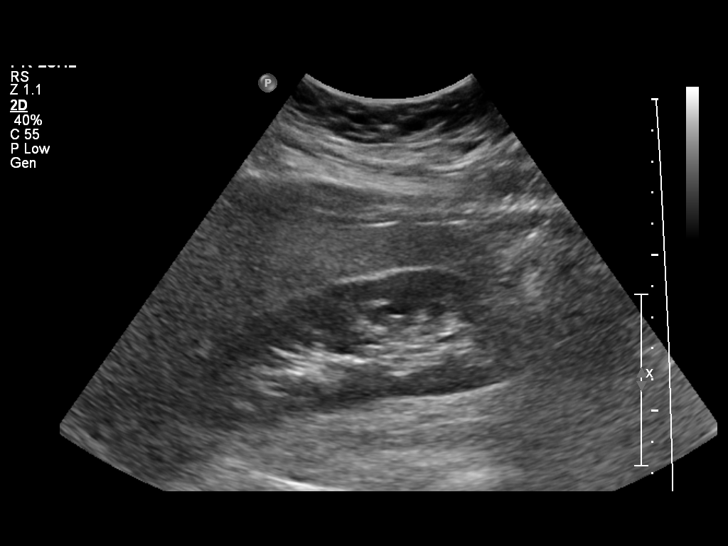
[im 79/119]
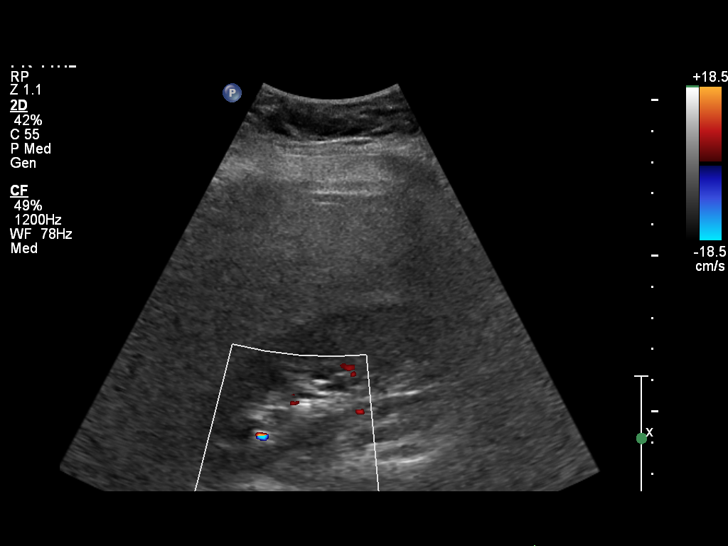
[im 89/119]
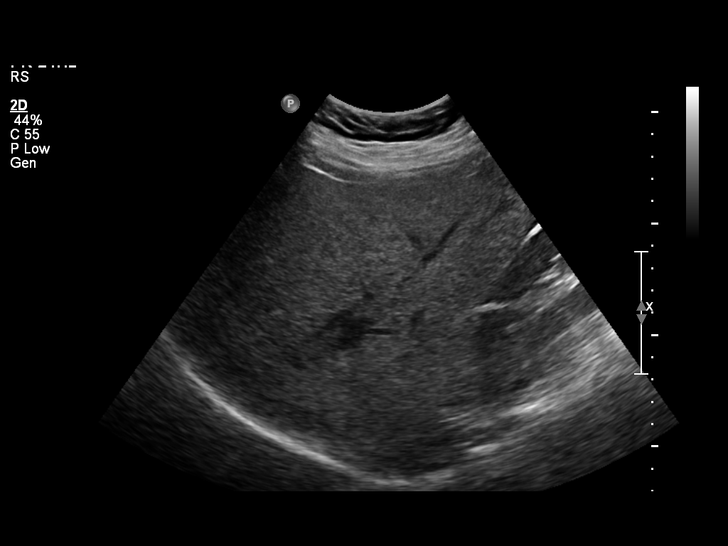
[im 99/119]
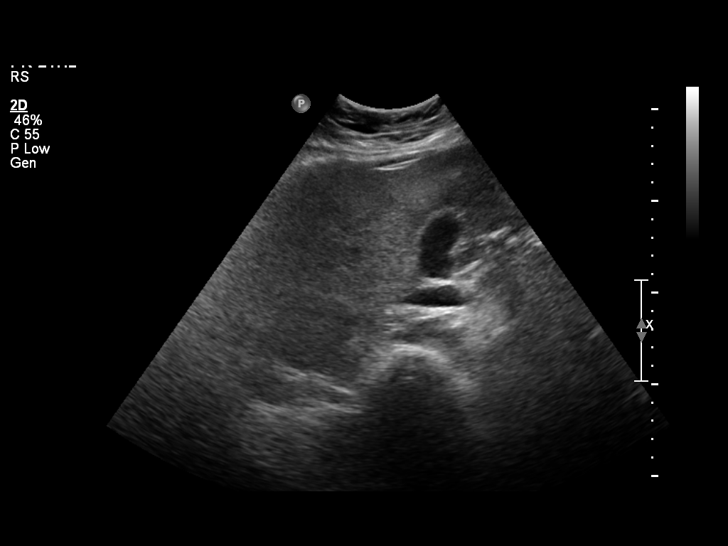
[im 109/119]
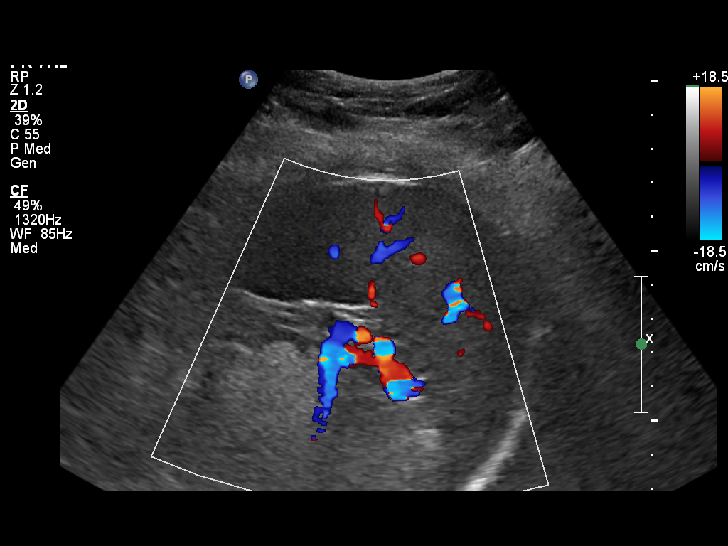
[im 119/119]
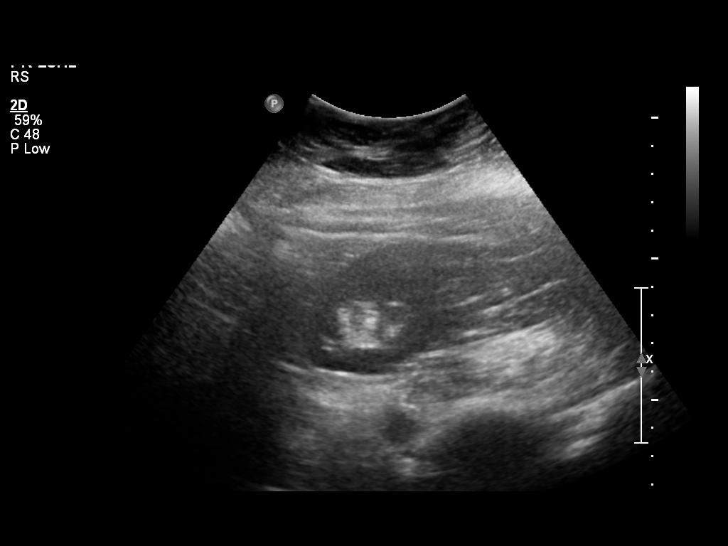

[14 of 25 positions shown; findings below may reference images not displayed]

FINDINGS: Gallbladder:  No gallstones, gallbladder wall thickening, or
pericholecystic fluid.

Common bile duct:  Measures 3 mm, within normal limits.

Liver:  Diffusely increased in echogenicity.  No definite focal
lesions.

IVC:  Obscured by bowel gas and body habitus.

Pancreas:  No focal abnormality seen.

Spleen:  Measures 7.5 cm, negative.

Right Kidney:  Measures 12.0 cm.  Parenchymal echogenicity is
normal.  There is a 5 mm echogenic lesion in the upper pole,
without associated hydronephrosis.  No solid lesion.

Left Kidney:  Measures 4.8 cm.  Parenchymal echogenicity is normal.
No hydronephrosis.  No focal lesion.

Abdominal aorta:  Relatively obscured by bowel gas.  No definite
aneurysm.

Comment:  Examination was technically difficult due to body
habitus.
IMPRESSION: 1.  Examination was technically difficult due to body habitus.
2.  Hepatic steatosis.
3.  Nonobstructing right renal stone.

## 2012-06-28 IMAGING — CR DG UGI W/ KUB
2 series · 2 of 2 positions shown · non-contrast
Comparison: Abdominal ultrasound [DATE]

CLINICAL DATA: Preop for bariatric surgery.  The patient reports
no gastric or esophageal problems.

UPPER GI SERIES WITH KUB
TECHNIQUE: Routine upper GI series was performed with thin and
high density barium.
Fluoroscopy Time: 2.07 minutes

[view not recorded (1 of 2)]
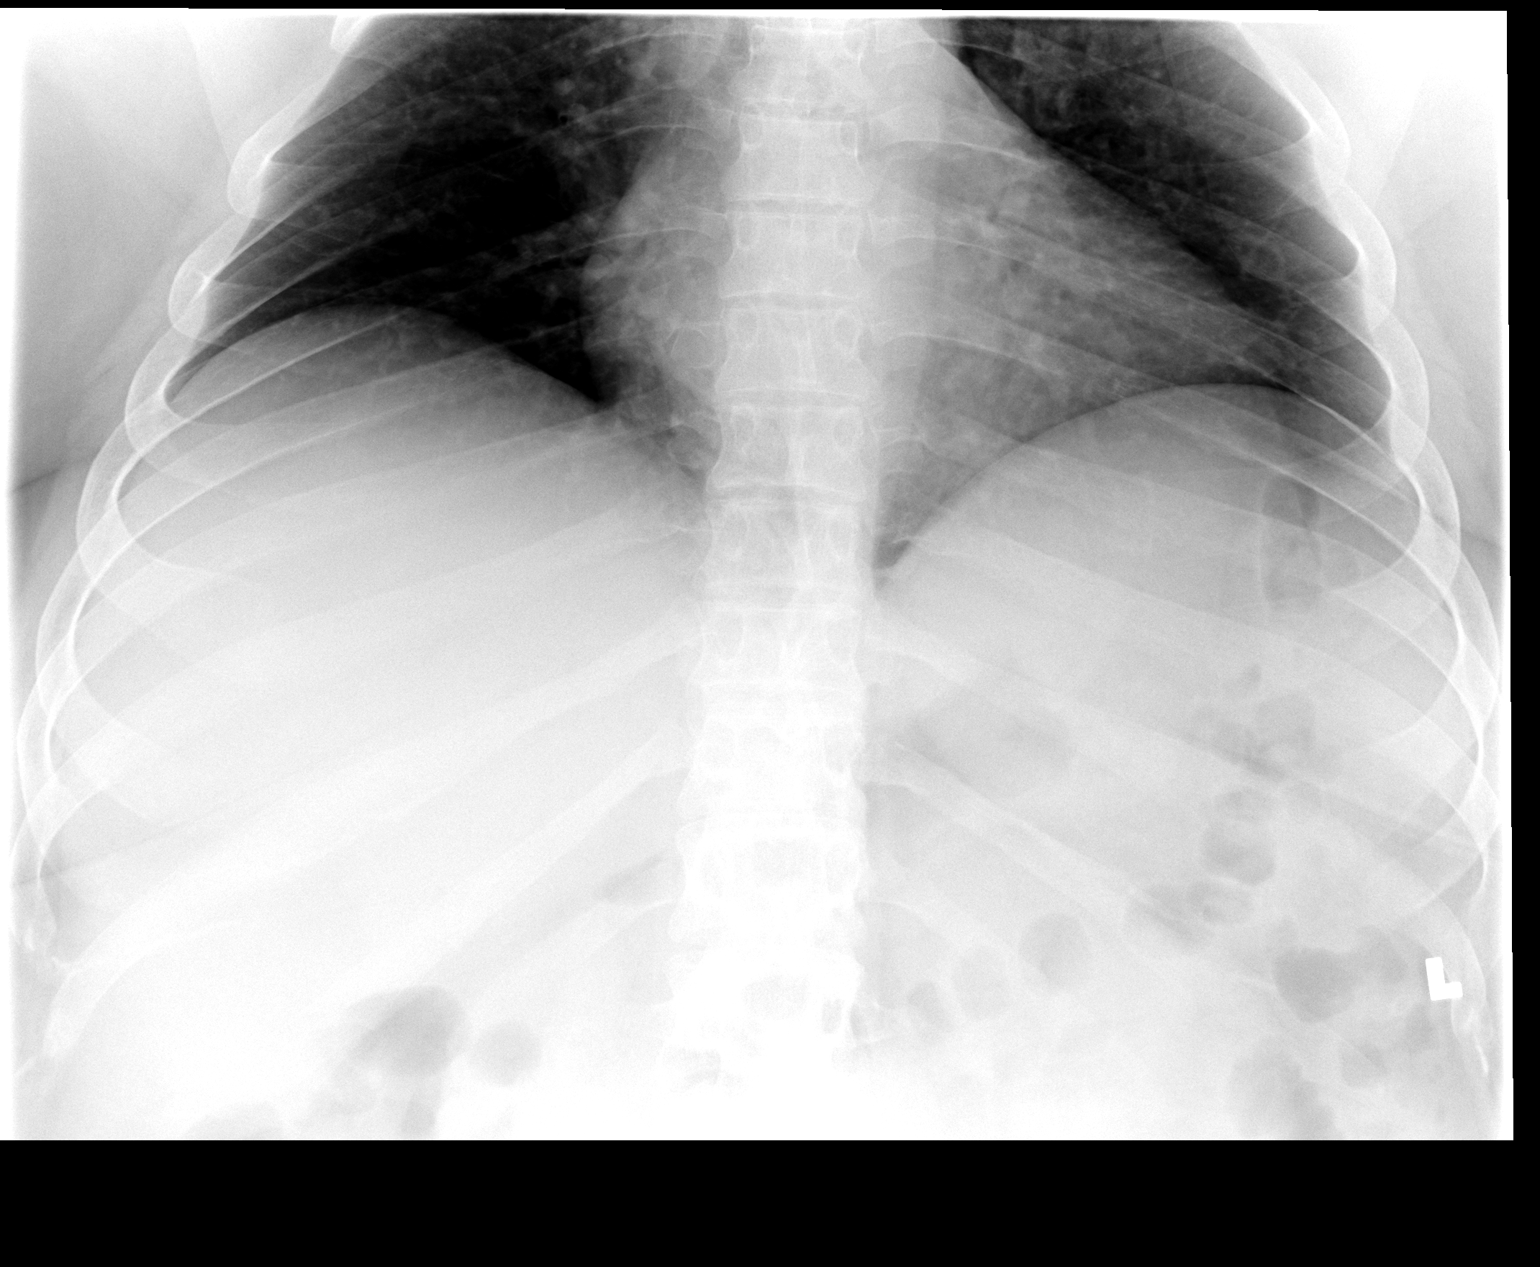

[view not recorded (2 of 2)]
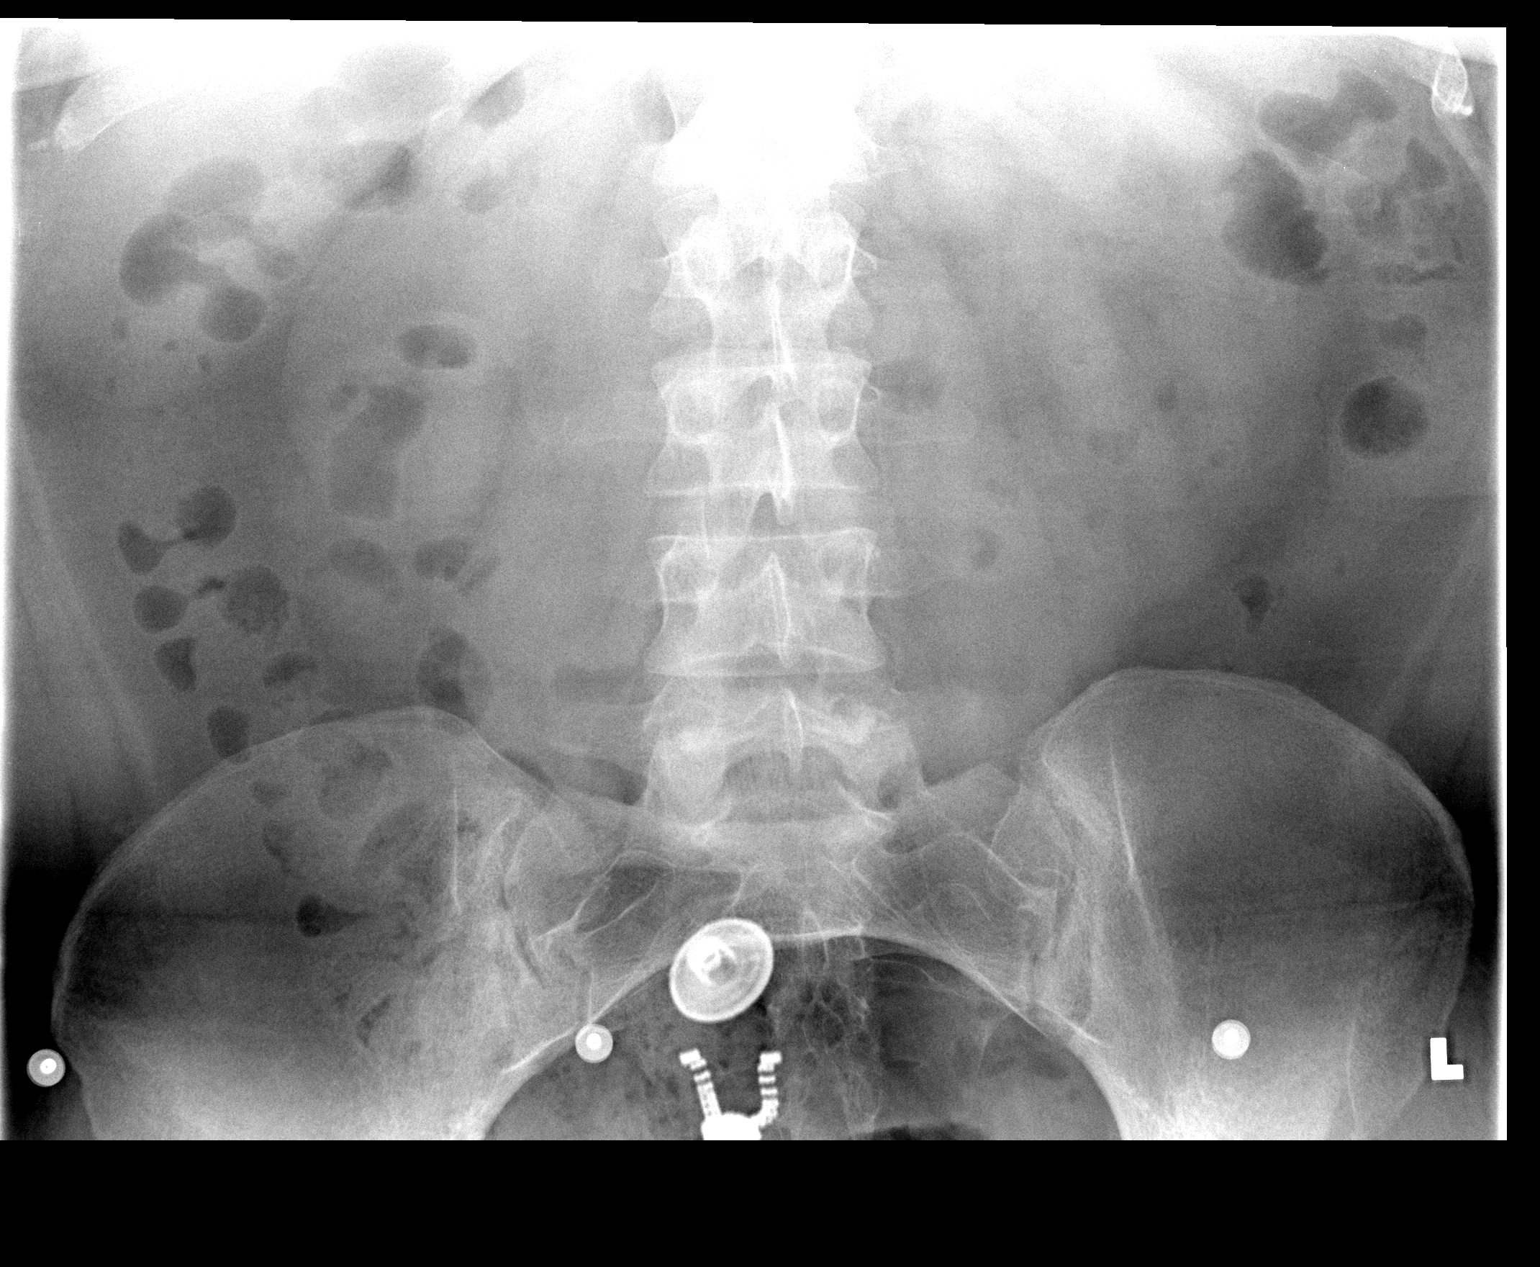

[2 of 2 positions shown; findings below may reference images not displayed]

FINDINGS: A scout view of the abdomen demonstrates a nonobstructive
bowel gas pattern.  Lung bases are clear.

Esophageal peristalsis is normal.  The esophagus is normal in
distensibility and contour.  No stricture or mass is identified.
The stomach is normally positioned.  No evidence of ulcer or rugal
thickening.  The duodenal bulb and duodenal C-loop have a normal
appearance.

An episode of spontaneous gastroesophageal reflux was visualized.
The patient was asymptomatic.
IMPRESSION: 1.  One episode of spontaneous gastroesophageal reflux was seen.
The patient was asymptomatic.
2.  Otherwise, normal upper GI exam.

## 2012-06-28 IMAGING — RF DG UGI W/ KUB
14 of 16 series · 14 of 16 positions shown · non-contrast
Comparison: Abdominal ultrasound [DATE]

CLINICAL DATA: Preop for bariatric surgery.  The patient reports
no gastric or esophageal problems.

UPPER GI SERIES WITH KUB
TECHNIQUE: Routine upper GI series was performed with thin and
high density barium.
Fluoroscopy Time: 2.07 minutes

[Series 1: run · 1 of 1 slices shown (1 of 14)]
[im 1/1]
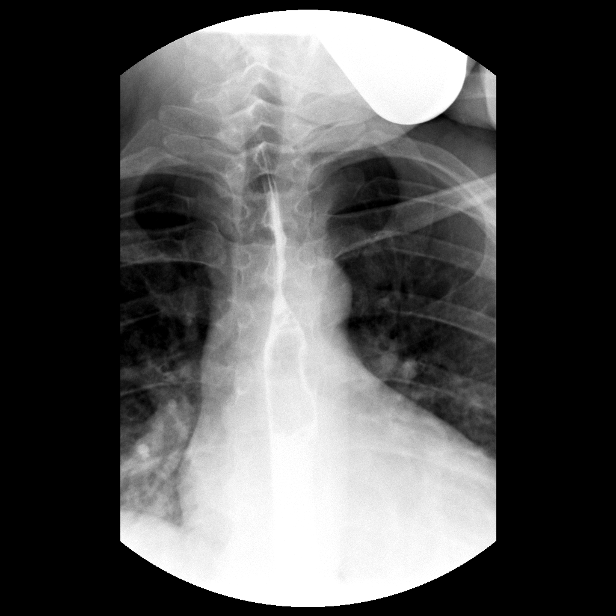

[Series 2: run · 1 of 1 slices shown (2 of 14)]
[im 1/1]
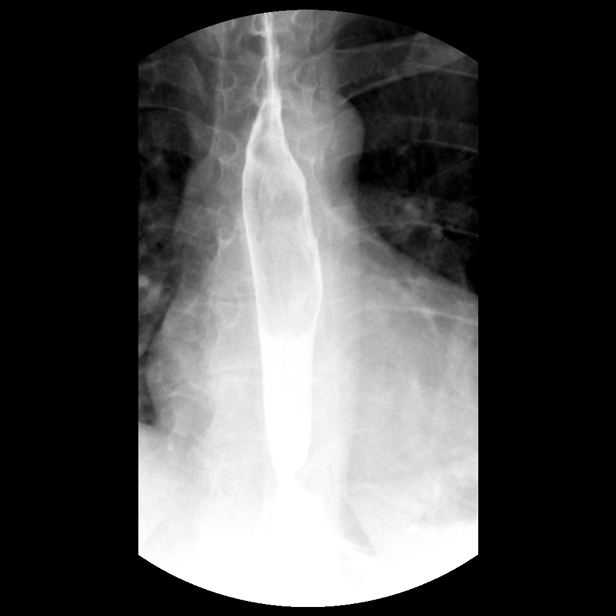

[Series 3: run · 1 of 1 slices shown (3 of 14)]
[im 1/1]
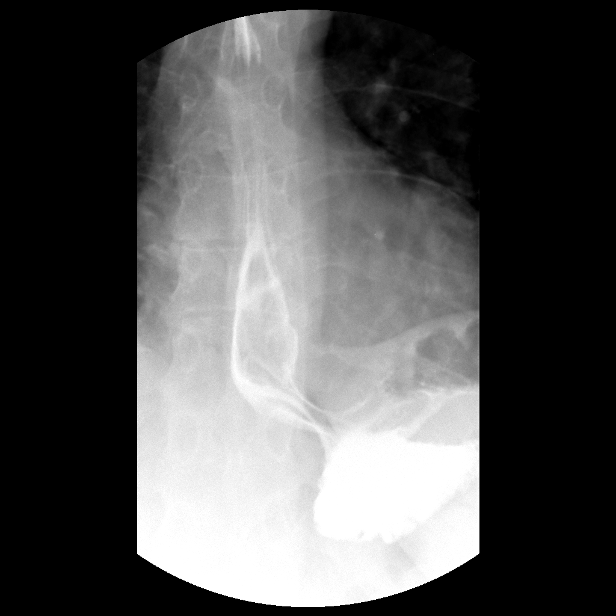

[Series 5: run · 1 of 1 slices shown (4 of 14)]
[im 1/1]
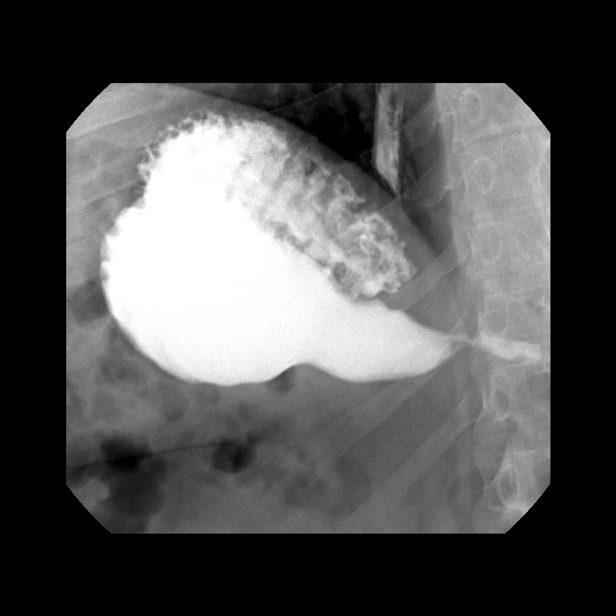

[Series 6: run · 1 of 1 slices shown (5 of 14)]
[im 1/1]
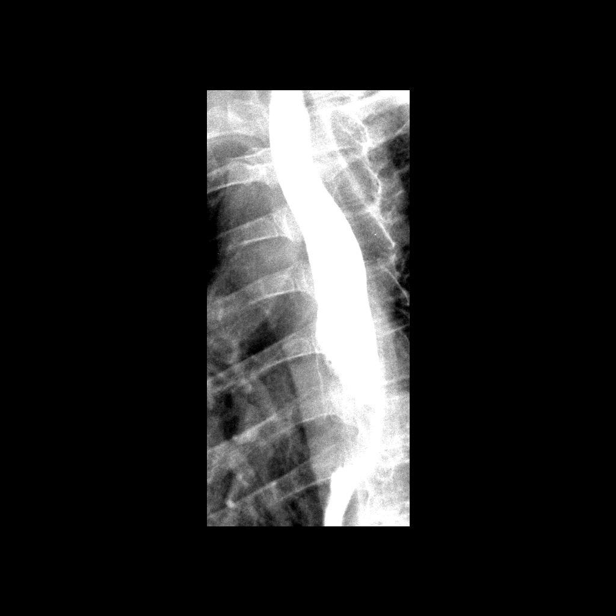

[Series 7: run · 1 of 1 slices shown (6 of 14)]
[im 1/1]
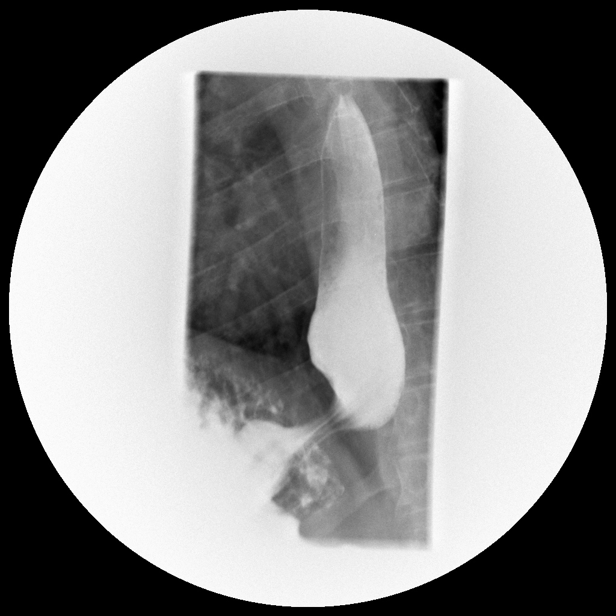

[Series 8: run · 1 of 1 slices shown (7 of 14)]
[im 1/1]
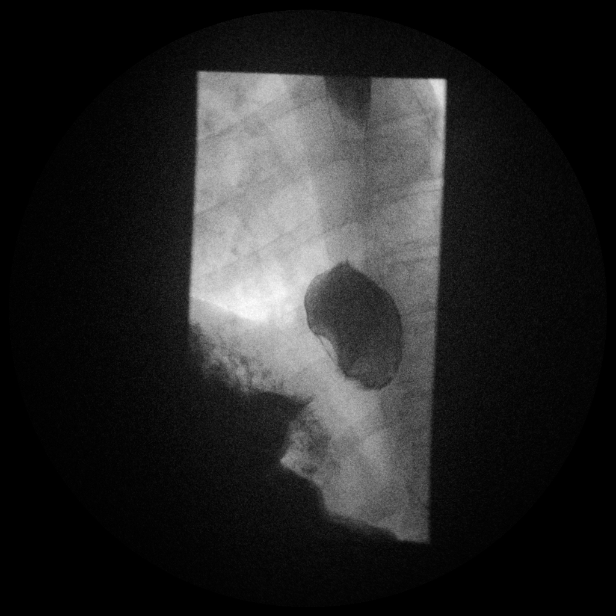

[Series 9: run · 1 of 1 slices shown (8 of 14)]
[im 1/1]
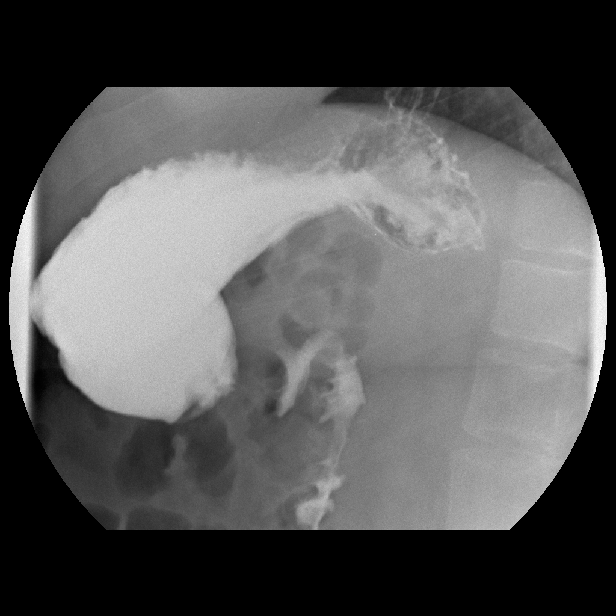

[Series 10: run · 1 of 1 slices shown (9 of 14)]
[im 1/1]
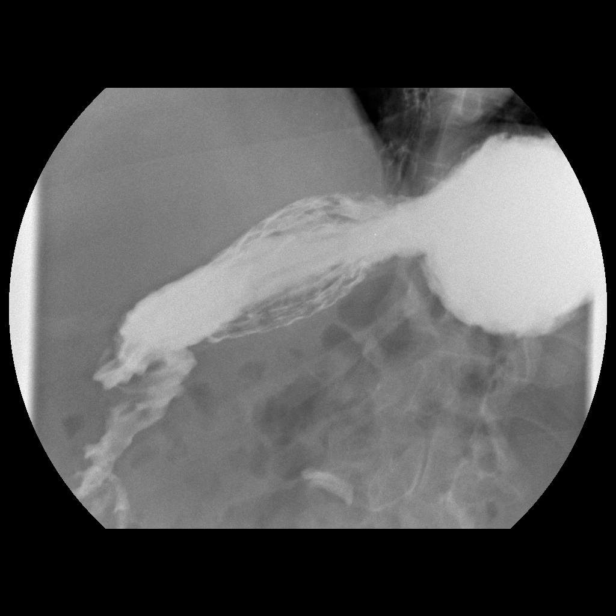

[Series 11: run · 1 of 1 slices shown (10 of 14)]
[im 1/1]
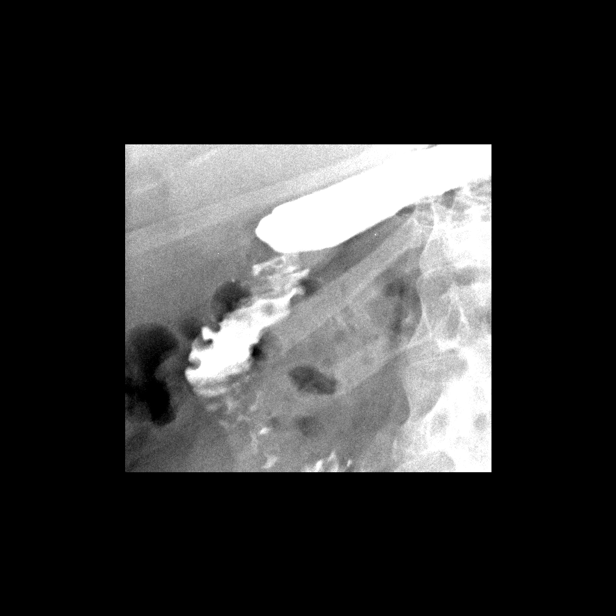

[Series 13: run · 1 of 1 slices shown (11 of 14)]
[im 1/1]
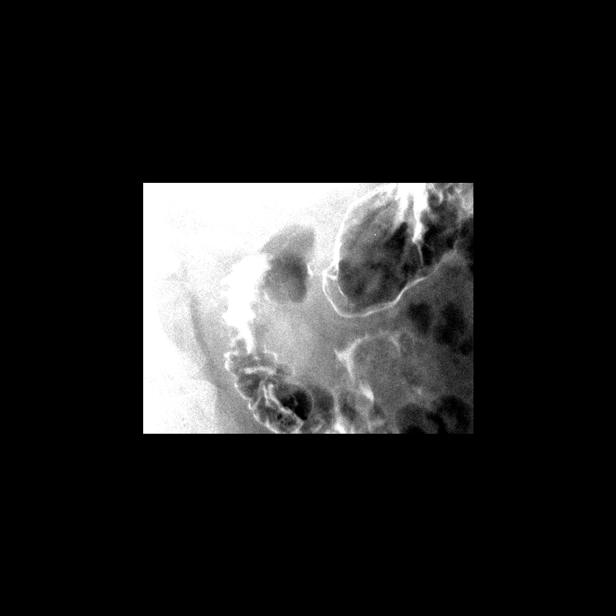

[Series 14: run · 1 of 1 slices shown (12 of 14)]
[im 1/1]
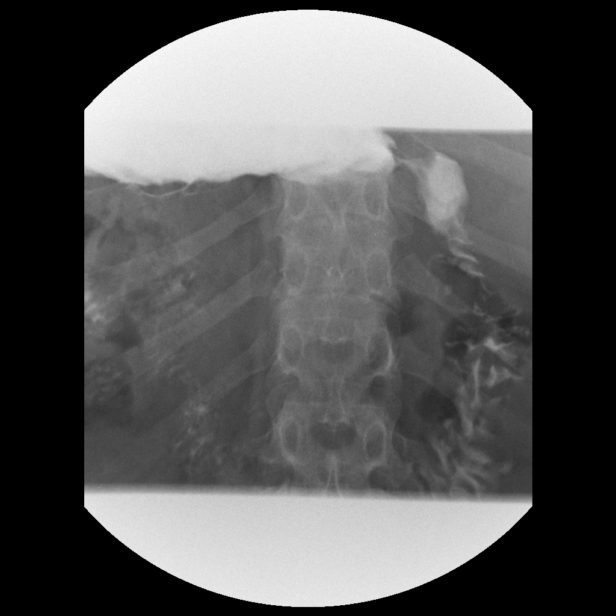

[Series 15: run · 1 of 1 slices shown (13 of 14)]
[im 1/1]
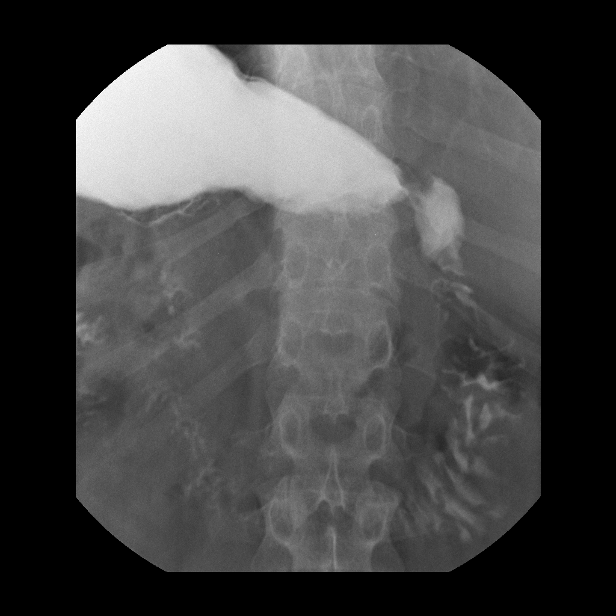

[Series 16: run · 1 of 1 slices shown (14 of 14)]
[im 1/1]
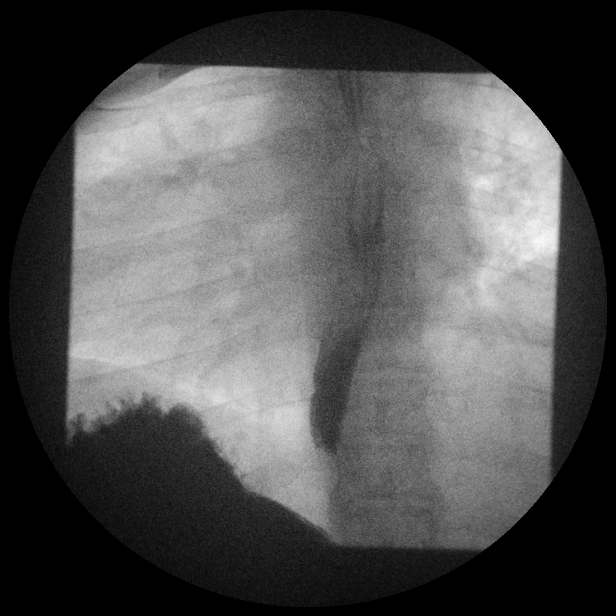

[14 of 16 positions shown; findings below may reference images not displayed]

FINDINGS: A scout view of the abdomen demonstrates a nonobstructive
bowel gas pattern.  Lung bases are clear.

Esophageal peristalsis is normal.  The esophagus is normal in
distensibility and contour.  No stricture or mass is identified.
The stomach is normally positioned.  No evidence of ulcer or rugal
thickening.  The duodenal bulb and duodenal C-loop have a normal
appearance.

An episode of spontaneous gastroesophageal reflux was visualized.
The patient was asymptomatic.
IMPRESSION: 1.  One episode of spontaneous gastroesophageal reflux was seen.
The patient was asymptomatic.
2.  Otherwise, normal upper GI exam.

## 2012-06-28 IMAGING — CR DG CHEST 2V
2 series · 2 of 2 positions shown · non-contrast
Comparison: None.

CLINICAL DATA: Nonsmoker, bariatric screening.

CHEST - 2 VIEW

[view not recorded (1 of 2)]
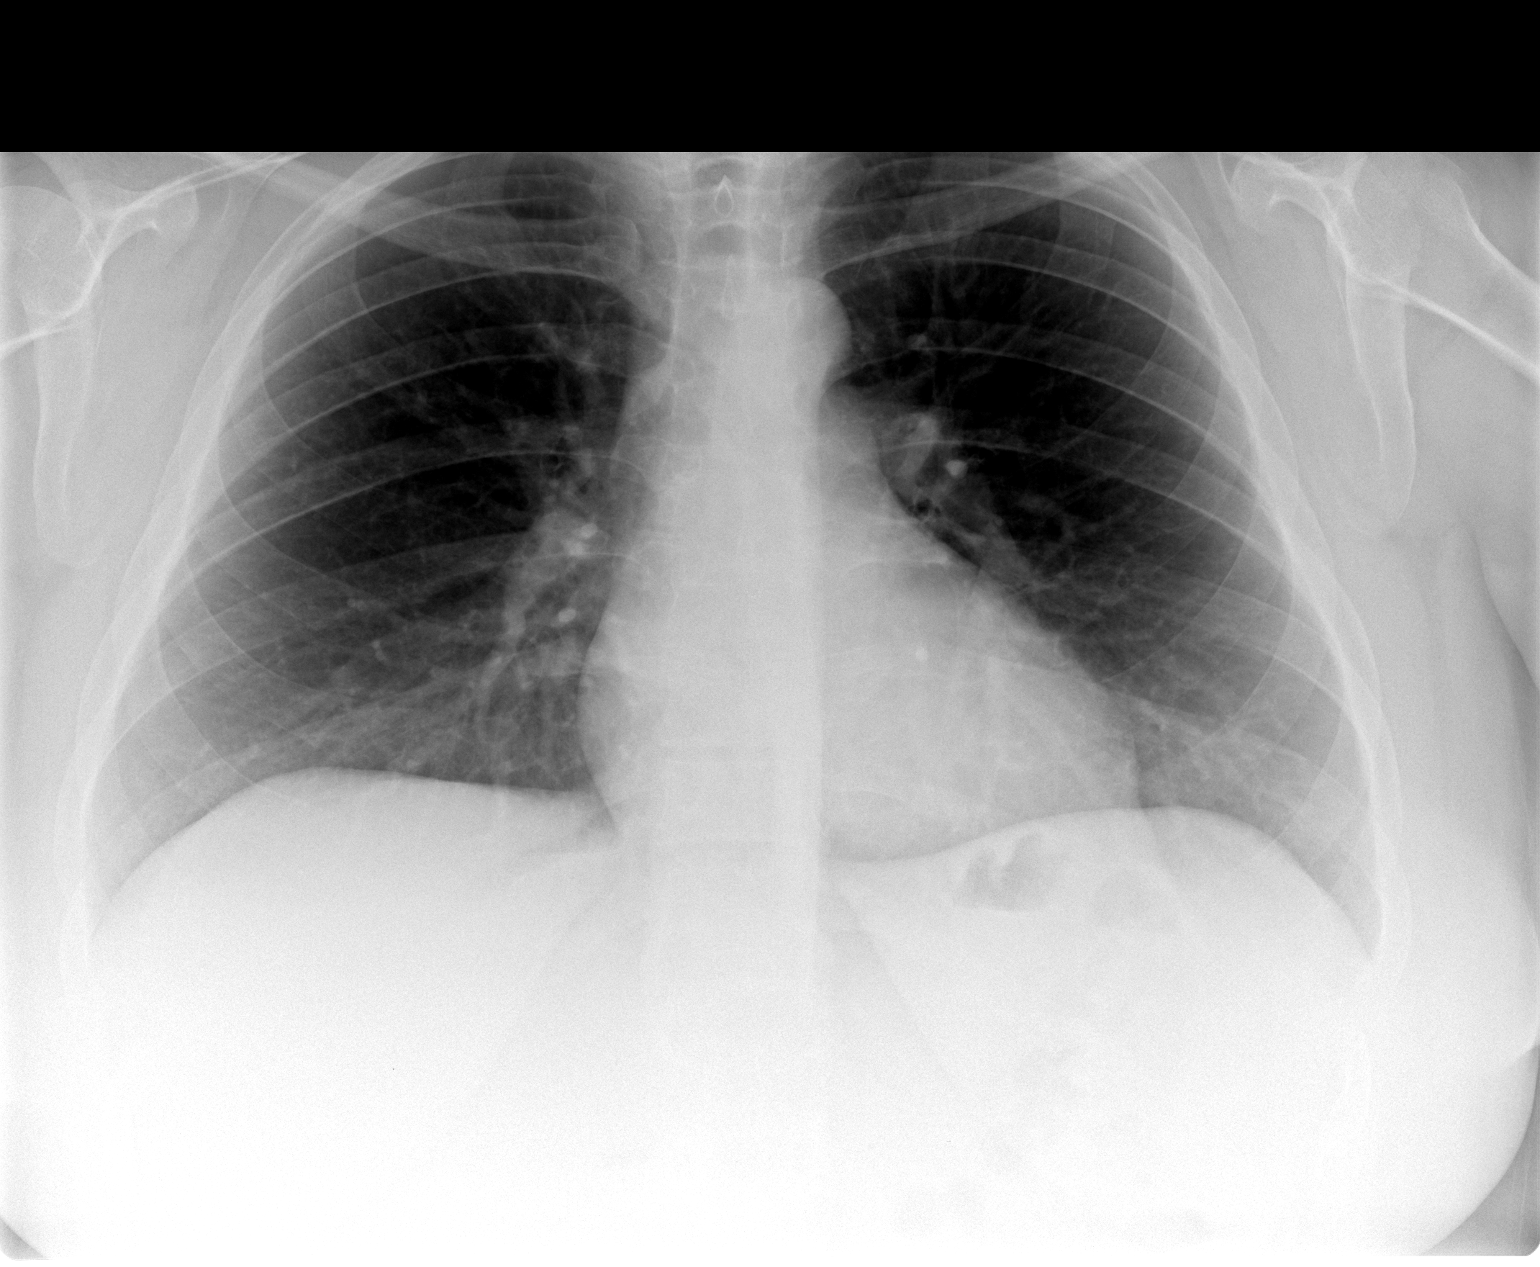

[view not recorded (2 of 2)]
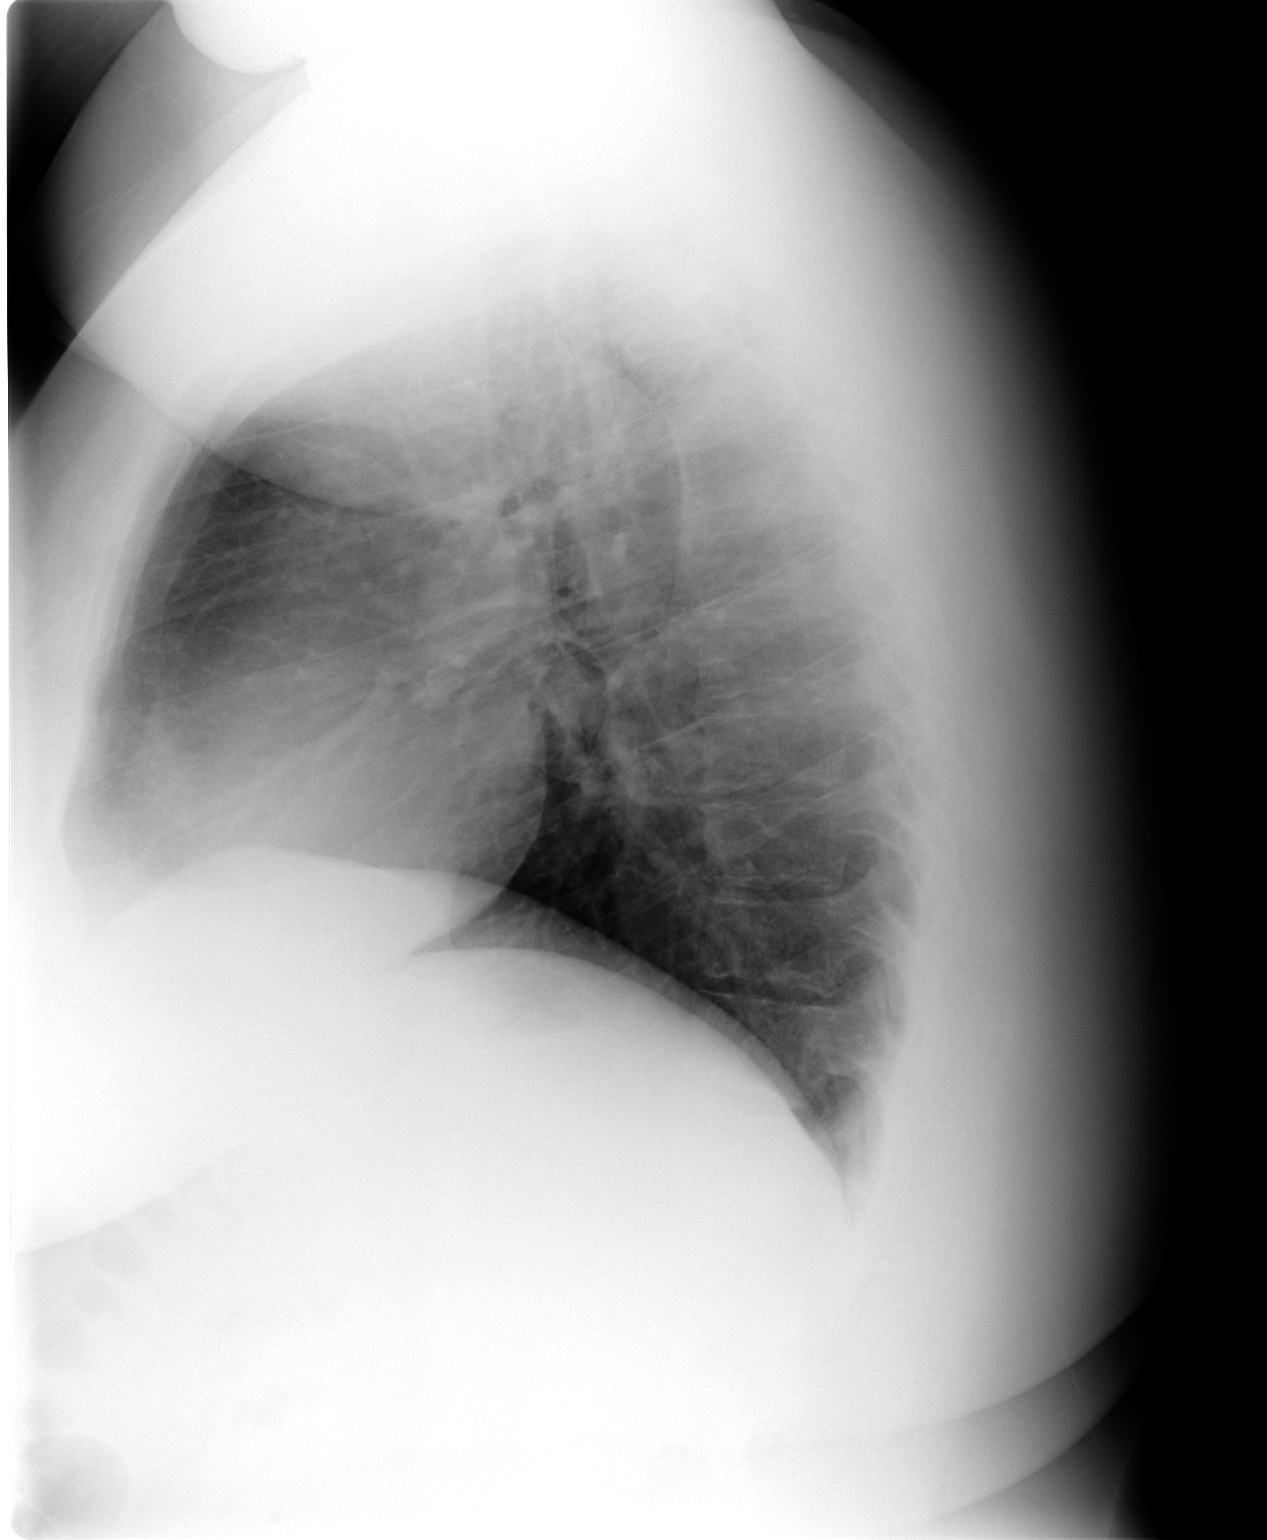

[2 of 2 positions shown; findings below may reference images not displayed]

FINDINGS: Trachea is midline.  Heart size normal.  Lungs are clear.
No pleural fluid.
IMPRESSION: Negative.

## 2012-06-29 ENCOUNTER — Encounter: Payer: 59 | Attending: General Surgery | Admitting: *Deleted

## 2012-06-29 ENCOUNTER — Encounter: Payer: Self-pay | Admitting: *Deleted

## 2012-06-29 VITALS — Ht 63.5 in | Wt 285.3 lb

## 2012-06-29 DIAGNOSIS — Z01818 Encounter for other preprocedural examination: Secondary | ICD-10-CM | POA: Insufficient documentation

## 2012-06-29 DIAGNOSIS — E669 Obesity, unspecified: Secondary | ICD-10-CM

## 2012-06-29 DIAGNOSIS — Z713 Dietary counseling and surveillance: Secondary | ICD-10-CM | POA: Insufficient documentation

## 2012-06-29 NOTE — Patient Instructions (Addendum)
   Follow Pre-Op Nutrition Goals to prepare for Gastric Bypass Surgery.   Call the [facility] at [phone] once you have been given your surgery date to enrolled in the Pre-Op Nutrition Class. You will need to attend this nutrition class 3-4 weeks prior to your surgery. 

## 2012-06-29 NOTE — Progress Notes (Signed)
  Pre-Op Assessment Visit:  Pre-Operative RYGB Surgery  Medical Nutrition Therapy:  Appt start time: 0900   End time:  1000.  Patient was seen on 06/29/2012 for Pre-Operative RYGB Nutrition Assessment. Assessment and letter of approval faxed to [facility] Bariatric Surgery Program coordinator on 06/29/2012.  Approval letter sent to [facility] center and will be available in the chart under the media tab.  Handouts given during visit include:  Pre-Op Goals   Bariatric Surgery Protein Shakes  Patient to call for Pre-Op and Post-Op Nutrition Education at the [facility] when surgery is scheduled.

## 2012-07-03 ENCOUNTER — Encounter: Payer: Self-pay | Admitting: *Deleted

## 2012-07-08 ENCOUNTER — Encounter (INDEPENDENT_AMBULATORY_CARE_PROVIDER_SITE_OTHER): Payer: Self-pay | Admitting: General Surgery

## 2012-07-23 ENCOUNTER — Other Ambulatory Visit (INDEPENDENT_AMBULATORY_CARE_PROVIDER_SITE_OTHER): Payer: Self-pay | Admitting: General Surgery

## 2012-08-05 ENCOUNTER — Encounter: Payer: 59 | Attending: General Surgery | Admitting: *Deleted

## 2012-08-05 VITALS — Ht 63.0 in | Wt 286.5 lb

## 2012-08-05 DIAGNOSIS — Z713 Dietary counseling and surveillance: Secondary | ICD-10-CM | POA: Insufficient documentation

## 2012-08-05 DIAGNOSIS — Z01818 Encounter for other preprocedural examination: Secondary | ICD-10-CM | POA: Insufficient documentation

## 2012-08-07 NOTE — Progress Notes (Signed)
Bariatric Class:  Appt start time: 0930 end time:  1030.  Pre-Operative Nutrition Class  Patient was seen on 08/05/12 for Pre-Operative Bariatric Surgery Education at [facility].   Surgery date: 08/24/12 Surgery type: RYGB Start weight at [facility]: 285.3 lbs (06/29/12)  Weight today: 286.5 lbs Weight change: n/a Total weight lost: n/a BMI: 49.7 kg/m^2  Samples given per MNT protocol: Bariatric Advantage Multivitamin Lot # [id number] Exp: 06/15  Bariatric Advantage Calcium Citrate Lot # [id number] Exp: 10/15  Bariatric Advantage Sublingual B12 Lot # [id number] Exp: 10/15  Celebrate Vitamins Complete Multivitamin Lot # [id number] Exp: 11/14  Celebrate Vitamins Calcium Citrate Lot # [id number] Exp: 08/15  Corliss Marcus Protein Powder Lot # [id number] Exp: 06/15  [employer]ier Protein Shake Lot # [id number] Exp: 06/28/13  The following the learning objective met by the patient during this course:  Identifies Pre-Op Dietary Goals and will begin 2 weeks pre-operatively  Identifies appropriate sources of fluids and proteins   States protein recommendations and appropriate sources pre and post-operatively  Identifies Post-Operative Dietary Goals and will follow for 2 weeks post-operatively  Identifies appropriate multivitamin and calcium sources  Describes the need for physical activity post-operatively and will follow MD recommendations  States when to call healthcare provider regarding medication questions or post-operative complications  Handouts given during class include:  Pre-Op Bariatric Surgery Diet Handout  Protein Shake Handout  Post-Op Bariatric Surgery Nutrition Handout  BELT Program Information Flyer  Support Group Information Flyer  [facility] Bariatric Supplements Price List  Follow-Up Plan: Patient will follow-up at [facility] 2 weeks post operatively for diet advancement per MD.

## 2012-08-09 ENCOUNTER — Encounter (HOSPITAL_COMMUNITY): Payer: Self-pay | Admitting: Pharmacy Technician

## 2012-08-09 ENCOUNTER — Encounter: Payer: Self-pay | Admitting: *Deleted

## 2012-08-09 NOTE — Patient Instructions (Signed)
Follow:   Pre-Op Diet per MD 2 weeks prior to surgery  Phase 2- Liquids (clear/full) 2 weeks after surgery  Vitamin/Mineral/Calcium guidelines for purchasing bariatric supplements  Exercise guidelines pre and post-op per MD  Follow-up at [facility] in 2 weeks post-op for diet advancement. Contact [place] as needed with questions/concerns. 

## 2012-08-12 ENCOUNTER — Ambulatory Visit (INDEPENDENT_AMBULATORY_CARE_PROVIDER_SITE_OTHER): Payer: Commercial Managed Care - PPO | Admitting: General Surgery

## 2012-08-12 ENCOUNTER — Encounter (INDEPENDENT_AMBULATORY_CARE_PROVIDER_SITE_OTHER): Payer: Self-pay | Admitting: General Surgery

## 2012-08-12 VITALS — BP 106/70 | HR 64 | Resp 14 | Ht 63.0 in | Wt 285.0 lb

## 2012-08-12 DIAGNOSIS — Z6841 Body Mass Index (BMI) 40.0 and over, adult: Secondary | ICD-10-CM

## 2012-08-12 DIAGNOSIS — K2100 Gastro-esophageal reflux disease with esophagitis, without bleeding: Secondary | ICD-10-CM

## 2012-08-12 DIAGNOSIS — E039 Hypothyroidism, unspecified: Secondary | ICD-10-CM

## 2012-08-12 MED ORDER — PEG-KCL-NACL-NASULF-NA ASC-C 100 G PO SOLR: 1.0000 | Freq: Once | ORAL | Status: DC

## 2012-08-12 NOTE — Patient Instructions (Addendum)
Keep up with the preop diet and exercise Pick up bowel prep - let us know if there is an issue

## 2012-08-12 NOTE — Progress Notes (Signed)
Patient ID: [name] [name], female   DOB: 10/08/1985, 26 y.o.   MRN: [id number]  Chief Complaint  Patient presents with  . Bariatric Pre-op    RNY scheduled 08/24/2012    HPI [name] [name] is a 26 y.o. female.   HPI 26 yo morbidly obese WF was referred by [name] [name] for evaluation of weight loss surgery, specifically gastric bypass.  The patient states that she is from of all of her life with her weight. Despite numerous attempts for sustained weight loss she has been unsuccessful. She has tried the Atkins diet, Weight Watchers, Slim fast, phentermine, as well as curves all without any long-term success. She initially had fertility issues while trying to get pregnant several years ago. Her pregnancies were also complicated by hypertension due to her obesity. 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  She has attended 2 weight loss seminars and has done extensive research regarding gastric bypass surgery. Moreover she has had numerous family members who have undergone laparoscopic Roux-en-Y gastric bypass. Her mother, uncle, and, and another uncle all have had gastric bypass surgery and have done well.  I initially met the patient on 06/23/2012. She has been approved for gastric bypass surgery. She comes in today for her preoperative appointment. She denies any changes since her last visit. She started on her preoperative diet.   Past Medical History  Diagnosis Date  . HYPOTHYROIDISM   . OBESITY   . DEPRESSION   . Anxiety   . Infertility, female hx   . Pregnancy induced hypertension   . Anemia     after 1st delivery  . GERD (gastroesophageal reflux disease)     pregnancy related- no meds  . Kidney stones     recurrent  . PCOS (polycystic ovarian syndrome)   . Morbid obesity     Past Surgical History  Procedure Laterality Date  . Cesarean section      x2  . Tonsillectomy    . Wisdom tooth extraction    . Tubal ligation      Family History  Problem  Relation Age of Onset  . Diabetes Other   . Hyperlipidemia Other   . Hypertension Other   . Hypertension Mother   . Hyperlipidemia Father   . Hypertension Father   . Kidney disease Father   . Cancer Father   . Hypertension Maternal Grandmother   . Diabetes Maternal Grandfather     Social History History  Substance Use Topics  . Smoking status: Never Smoker   . Smokeless tobacco: Never Used  . Alcohol Use: No    No Known Allergies  Current Outpatient Prescriptions  Medication Sig Dispense Refill  . diphenhydrAMINE (SOMINEX) 25 MG tablet Take 75 mg by mouth at bedtime as needed for sleep.       . venlafaxine XR (EFFEXOR-XR) 75 MG 24 hr capsule Take 75 mg by mouth daily before breakfast.      . zolpidem (AMBIEN) 10 MG tablet Take 10 mg by mouth at bedtime as needed for sleep.      . peg 3350 powder (MOVIPREP) 100 G SOLR Take 1 kit (100 g total) by mouth once.  1 kit  0   No current facility-administered medications for this visit.   Facility-Administered Medications Ordered in Other Visits  Medication Dose Route Frequency Provider Last Rate Last Dose  . ceFAZolin (ANCEF) injection 2 g  2 g Intramuscular Once         Review of   Systems Review of Systems  Constitutional: Negative for fever, activity change, appetite change, fatigue and unexpected weight change.  HENT: Negative for hearing loss, nosebleeds, congestion, neck pain and neck stiffness.   Eyes: Negative for photophobia and visual disturbance.  Respiratory: Negative for apnea, cough, chest tightness, shortness of breath, wheezing and stridor.   Cardiovascular: Negative for chest pain, palpitations and leg swelling.       Denies CP, SOB, orthopnea, PND  Gastrointestinal: Negative for nausea, vomiting, abdominal pain, diarrhea, blood in stool and abdominal distention.       Some heartburn on occasion with certain foods. BM every other day. Doesn't take reflux meds  Genitourinary: Negative for dysuria, urgency,  frequency, flank pain, vaginal bleeding, difficulty urinating and pelvic pain.       G2P2; had fertility issues initially; has had some irregular periods. S/p tubal  Musculoskeletal: Negative for back pain, arthralgias and gait problem.  Neurological: Negative for tremors, seizures, syncope, light-headedness, numbness and headaches.  Hematological: Negative for adenopathy. Does not bruise/bleed easily.    Blood pressure 106/70, pulse 64, resp. rate 14, height 5' 3" (1.6 m), weight 285 lb (129.275 kg).  Physical Exam Physical Exam  Vitals reviewed. Constitutional: She is oriented to person, place, and time. She appears well-developed and well-nourished. No distress.  Morbidly obese  HENT:  Head: Normocephalic and atraumatic.  Right Ear: External ear normal.  Left Ear: External ear normal.  Eyes: Conjunctivae are normal. No scleral icterus.  Neck: Normal range of motion. Neck supple. No tracheal deviation present. No thyromegaly present.  Cardiovascular: Normal rate, regular rhythm and intact distal pulses.   No murmur heard. Pulmonary/Chest: Effort normal and breath sounds normal. No respiratory distress. She has no wheezes.  Abdominal: Soft. Bowel sounds are normal. She exhibits no distension. There is no tenderness. There is no rebound. No hernia. Hernia confirmed negative in the ventral area.    Pannicular Rash has resolved  Musculoskeletal: Normal range of motion. She exhibits no edema and no tenderness.  Lymphadenopathy:    She has no cervical adenopathy.  Neurological: She is alert and oriented to person, place, and time. She exhibits normal muscle tone.  Skin: Skin is warm and dry. She is not diaphoretic.  Psychiatric: She has a normal mood and affect. Her behavior is normal. Judgment and thought content normal.    Data Reviewed [name] [name]'s note  U/s - fatty liver, 5mm echogenic kidney lesion UGI - 1 episode of reflux Labs - normal thyroid, cmet, cbc; LDL 119; TG  151  Assessment    Morbid obesity BMI 50.49 PCOS GERD - no meds H/o hypothyroidism Depression H/o pregnancy induced hypertension    Plan    We reviewed her preoperative workup including her upper GI, abdominal ultrasound, and preoperative labs. All of her questions were asked and answered. I discussed my experience with the procedure. She was given instructions for her preoperative bowel prep. I encouraged her to continue with her preoperative diet as well as getting regular exercise. She is currently scheduled for surgery on March 11  Wayburn Shaler M. Keisi Eckford, MD, FACS General, Bariatric, & Minimally Invasive Surgery [facility]          Cyndie Woodbeck M 08/12/2012, 9:28 AM    

## 2012-08-13 NOTE — Patient Instructions (Addendum)
[name] [name]  08/13/2012   Your procedure is scheduled on: 08/24/12  Report to [facility] at [phone].  Call this number if you have problems the morning of surgery 336-: [phone]   Remember: follow bowel prep instructions   Do not eat food or drink liquids After Midnight.     Take these medicines the morning of surgery with A SIP OF WATER: effexor   Do not wear jewelry, make-up or nail polish.  Do not wear lotions, powders, or perfumes. You may wear deodorant.  Do not shave 48 hours prior to surgery. Men may shave face and neck.  Do not bring valuables to the hospital.  Contacts, dentures or bridgework may not be worn into surgery.  Leave suitcase in the car. After surgery it may be brought to your room.  For patients admitted to the hospital, checkout time is 11:00 AM the day of discharge.    Please read over the following fact sheets that you were given: MRSA Information.  Birdie Sons, RN  pre op nurse call if needed [phone]    FAILURE TO FOLLOW THESE INSTRUCTIONS MAY RESULT IN CANCELLATION OF YOUR SURGERY   Patient Signature: ___________________________________________

## 2012-08-13 NOTE — Progress Notes (Signed)
Chest x-ray and EKG 06/28/12 on [facility]

## 2012-08-16 ENCOUNTER — Ambulatory Visit (INDEPENDENT_AMBULATORY_CARE_PROVIDER_SITE_OTHER): Payer: 59 | Admitting: Internal Medicine

## 2012-08-16 ENCOUNTER — Encounter (HOSPITAL_COMMUNITY): Payer: Self-pay

## 2012-08-16 ENCOUNTER — Encounter: Payer: Self-pay | Admitting: Internal Medicine

## 2012-08-16 ENCOUNTER — Encounter (HOSPITAL_COMMUNITY)
Admission: RE | Admit: 2012-08-16 | Discharge: 2012-08-16 | Disposition: A | Discharge status: Home / Self Care | Payer: 59 | Source: Ambulatory Visit | Attending: General Surgery | Admitting: General Surgery

## 2012-08-16 VITALS — BP 118/82 | HR 75 | Temp 98.1°F | Wt 284.8 lb

## 2012-08-16 DIAGNOSIS — H6982 Other specified disorders of Eustachian tube, left ear: Secondary | ICD-10-CM

## 2012-08-16 DIAGNOSIS — H9202 Otalgia, left ear: Secondary | ICD-10-CM

## 2012-08-16 DIAGNOSIS — H9209 Otalgia, unspecified ear: Secondary | ICD-10-CM

## 2012-08-16 DIAGNOSIS — Z6841 Body Mass Index (BMI) 40.0 and over, adult: Secondary | ICD-10-CM

## 2012-08-16 DIAGNOSIS — H6992 Unspecified Eustachian tube disorder, left ear: Secondary | ICD-10-CM

## 2012-08-16 DIAGNOSIS — H6980 Other specified disorders of Eustachian tube, unspecified ear: Secondary | ICD-10-CM

## 2012-08-16 LAB — CBC WITH DIFFERENTIAL/PLATELET
Basophils Relative: 0 % (ref 0–1)
Eosinophils Relative: 2 % (ref 0–5)
HCT: 46.7 % — ABNORMAL HIGH (ref 36.0–46.0)
Hemoglobin: 12.4 g/dL (ref 12.0–15.0)
MCHC: 26.6 g/dL — ABNORMAL LOW (ref 30.0–36.0)
MCV: 102.4 fL — ABNORMAL HIGH (ref 78.0–100.0)
Monocytes Absolute: 0.7 10*3/uL (ref 0.1–1.0)
Monocytes Relative: 7 % (ref 3–12)
Neutro Abs: 6.3 10*3/uL (ref 1.7–7.7)
RDW: 14.9 % (ref 11.5–15.5)

## 2012-08-16 LAB — COMPREHENSIVE METABOLIC PANEL
Albumin: 3.6 g/dL (ref 3.5–5.2)
BUN: 15 mg/dL (ref 6–23)
CO2: 27 mEq/L (ref 19–32)
Calcium: 9.1 mg/dL (ref 8.4–10.5)
Chloride: 104 mEq/L (ref 96–112)
Creatinine, Ser: 0.61 mg/dL (ref 0.50–1.10)
GFR calc non Af Amer: >90 mL/min (ref >90)
Total Bilirubin: 0.3 mg/dL (ref 0.3–1.2)

## 2012-08-16 LAB — HCG, SERUM, QUALITATIVE: Preg, Serum: NEGATIVE

## 2012-08-16 LAB — SURGICAL PCR SCREEN: Staphylococcus aureus: NEGATIVE

## 2012-08-16 MED ORDER — PROMETHAZINE-PHENYLEPHRINE 6.25-5 MG/5ML PO SYRP: 5.0000 mL | ORAL_SOLUTION | ORAL | Status: DC | PRN

## 2012-08-16 NOTE — Assessment & Plan Note (Signed)
Scheduled for bariatric intervention surgery 8 days from now No infection evident on exam, but will have low threshold for appeared treatment if symptoms worsen next 48 hours. Explained same the patient who understands and agrees

## 2012-08-16 NOTE — Progress Notes (Signed)
  Subjective:    HPI  complains of ear pain Onset 3 days ago, worse in past 24 hours Concerned with infection due to history of same this child Also planning bariatric surgery in 8 days Not improved with warm compresses. No other decongestant or over-the-counter medication use for same  Past Medical History  Diagnosis Date  . OBESITY   . DEPRESSION   . Anxiety   . Infertility, female hx   . Anemia     after 1st delivery  . GERD (gastroesophageal reflux disease)     pregnancy related- no meds  . PCOS (polycystic ovarian syndrome)   . Morbid obesity   . HYPOTHYROIDISM     hx of  . Headache     hx of migraines  . Pregnancy induced hypertension     gestational    Review of Systems Constitutional: No fever or night sweats, no unexpected weight change Pulmonary: No pleurisy or hemoptysis Cardiovascular: No chest pain or palpitations     Objective:   Physical Exam BP 118/82  Pulse 75  Temp(Src) 98.1 F (36.7 C) (Oral)  Wt 284 lb 12.8 oz (129.184 kg)  BMI 50.46 kg/m2  SpO2 99%  LMP 07/31/2012 GEN: nontoxic appearing  HENT: NCAT, no sinus tenderness bilaterally, TMs hazy L>R with min effusion no erythema; nares without clear discharge, oropharynx mild erythema, no exudate Eyes: Vision grossly intact, no conjunctivitis Lungs: Clear to auscultation without rhonchi or wheeze, no increased work of breathing Cardiovascular: Regular rate and rhythm, no bilateral edema      Assessment & Plan:  L ear pain Eustachian tube dysfunction   Explained lack of efficacy for antibiotics in viral disease -the patient call if symptoms worse or unimproved next 48 hours to consider empiric therapy given upcoming surgery Prescription antihistamine with decongestant - new prescriptions done Symptomatic care with Tylenol or Advil, hydration and rest -

## 2012-08-16 NOTE — Patient Instructions (Signed)
It was good to see you today. If you develop worsening symptoms or fever, call and we can reconsider antibiotics, but it does not appear necessary to use antibiotics at this time. prescription antihistamine with decongestant syrup - Your prescription(s) have been submitted to your pharmacy. Please take as directed and contact our office if you believe you are having problem(s) with the medication(s). Alternate between ibuprofen and tylenol for aches, pain and/or fever symptoms as discussed Hydrate, rest and call if worse or unimproved

## 2012-08-16 NOTE — Progress Notes (Signed)
Subjective:    Patient ID: [name] [name], female    DOB: 11-Jul-1985, 27 y.o.   MRN: [id number]  Ear Fullness  There is pain in the left ear. This is a new problem. The current episode started yesterday. The problem occurs constantly. The problem has been unchanged. There has been no fever. The pain is at a severity of 4/10. The pain is mild. Associated symptoms include coughing, headaches and rhinorrhea. Pertinent negatives include no abdominal pain, diarrhea, drainage, ear discharge, hearing loss, sore throat or vomiting. She has tried heat pack and NSAIDs for the symptoms. The treatment provided mild relief. Her past medical history is significant for a chronic ear infection.  Patient is scheduled for bariatric surgery on 3/11 and does not want to become ill before the surgery.  Review of Systems  Constitutional: Negative for fever and unexpected weight change.  HENT: Positive for ear pain, congestion and rhinorrhea. Negative for hearing loss, sore throat, facial swelling, sinus pressure, tinnitus and ear discharge.   Respiratory: Positive for cough. Negative for chest tightness and shortness of breath.   Cardiovascular: Negative for chest pain.  Gastrointestinal: Negative for nausea, vomiting, abdominal pain and diarrhea.  Neurological: Positive for headaches.  Hematological: Negative for adenopathy.   Past Medical History  Diagnosis Date  . OBESITY   . DEPRESSION   . Anxiety   . Infertility, female hx   . Anemia     after 1st delivery  . GERD (gastroesophageal reflux disease)     pregnancy related- no meds  . PCOS (polycystic ovarian syndrome)   . Morbid obesity   . HYPOTHYROIDISM     hx of  . Headache     hx of migraines  . Pregnancy induced hypertension     gestational      Objective:   Physical Exam  Constitutional: She appears well-developed and well-nourished. No distress.  Obese.   HENT:  Head: Normocephalic and atraumatic.  Right Ear: External ear and ear  canal normal. No drainage, swelling or tenderness. Tympanic membrane is not erythematous and not bulging. A middle ear effusion is present. No decreased hearing is noted.  Left Ear: External ear and ear canal normal. No drainage, swelling or tenderness. Tympanic membrane is not erythematous and not bulging. A middle ear effusion is present. No decreased hearing is noted.  Eyes: Conjunctivae are normal. Pupils are equal, round, and reactive to light. Right eye exhibits no discharge. Left eye exhibits no discharge. No scleral icterus.  Neck: No JVD present.  Cardiovascular: Normal rate, regular rhythm and normal heart sounds.  Exam reveals no gallop and no friction rub.   No murmur heard. Pulmonary/Chest: Effort normal and breath sounds normal. No respiratory distress. She has no wheezes.  Lymphadenopathy:    She has no cervical adenopathy.  Psychiatric: She has a normal mood and affect. Her behavior is normal. Judgment and thought content normal.    Filed Vitals:   08/16/12 0903  BP: 118/82  Pulse: 75  Temp: 98.1 F (36.7 C)    Lab Results  Component Value Date   WBC 8.4 06/23/2012   HGB 13.1 06/23/2012   HCT 39.0 06/23/2012   PLT 311 06/23/2012   GLUCOSE 80 06/23/2012   CHOL 194 06/23/2012   TRIG 151* 06/23/2012   HDL 45 06/23/2012   LDLDIRECT 140.3 04/03/2010   LDLCALC 119* 06/23/2012   ALT 19 06/23/2012   AST 19 06/23/2012   NA 139 06/23/2012   K 4.4 06/23/2012   CL  105 06/23/2012   CREATININE 0.68 06/23/2012   BUN 13 06/23/2012   CO2 23 06/23/2012   TSH 2.067 06/23/2012      Assessment & Plan:  Ear fullness- No infection seen on exam today. Suspect ear fullness is due to head congestion.  Rx'd prescription decongestant promethazine with phenylephrine. Patient will call if she experiences a fever or pain persists and antibiotic will be sent to her pharmacy.   Jennifer Little PA-S  I have personally reviewed this case with PA student. I also personally examined this patient. I agree with history and  findings as documented above. I reviewed, discussed and approve of the assessment and plan as listed above. Rene Paci, MD

## 2012-08-23 NOTE — Anesthesia Preprocedure Evaluation (Addendum)
Anesthesia Evaluation  Patient identified by MRN, date of birth, ID band Patient awake    Reviewed: Allergy & Precautions, H&P , NPO status , Patient's Chart, lab work & pertinent test results  Airway Mallampati: II TM Distance: >3 FB Neck ROM: full    Dental no notable dental hx. (+) Teeth Intact and Dental Advisory Given   Pulmonary neg pulmonary ROS,  breath sounds clear to auscultation  Pulmonary exam normal       Cardiovascular Exercise Tolerance: Good hypertension, negative cardio ROS  Rhythm:regular Rate:Normal     Neuro/Psych negative neurological ROS  negative psych ROS   GI/Hepatic negative GI ROS, Neg liver ROS,   Endo/Other  negative endocrine ROSHypothyroidism Morbid obesity  Renal/GU negative Renal ROS  negative genitourinary   Musculoskeletal   Abdominal   Peds  Hematology negative hematology ROS (+)   Anesthesia Other Findings   Reproductive/Obstetrics negative OB ROS                          Anesthesia Physical Anesthesia Plan  ASA: III  Anesthesia Plan: General   Post-op Pain Management:    Induction: Intravenous  Airway Management Planned: Oral ETT  Additional Equipment:   Intra-op Plan:   Post-operative Plan: Extubation in OR  Informed Consent: I have reviewed the patients History and Physical, chart, labs and discussed the procedure including the risks, benefits and alternatives for the proposed anesthesia with the patient or authorized representative who has indicated his/her understanding and acceptance.   Dental Advisory Given  Plan Discussed with: CRNA and Surgeon  Anesthesia Plan Comments:         Anesthesia Quick Evaluation

## 2012-08-24 ENCOUNTER — Inpatient Hospital Stay (HOSPITAL_COMMUNITY)
Admission: RE | Admit: 2012-08-24 | Discharge: 2012-08-26 | DRG: 621 | Disposition: A | Discharge status: Home / Self Care | Payer: 59 | Source: Ambulatory Visit | Attending: General Surgery | Admitting: General Surgery

## 2012-08-24 ENCOUNTER — Encounter (HOSPITAL_COMMUNITY): Payer: Self-pay | Admitting: Anesthesiology

## 2012-08-24 ENCOUNTER — Inpatient Hospital Stay (HOSPITAL_COMMUNITY): Payer: 59 | Admitting: Anesthesiology

## 2012-08-24 ENCOUNTER — Encounter (HOSPITAL_COMMUNITY)
Admission: RE | Disposition: A | Discharge status: Home / Self Care | Payer: Self-pay | Source: Ambulatory Visit | Attending: General Surgery

## 2012-08-24 ENCOUNTER — Encounter (HOSPITAL_COMMUNITY): Payer: Self-pay | Admitting: *Deleted

## 2012-08-24 DIAGNOSIS — F3289 Other specified depressive episodes: Secondary | ICD-10-CM | POA: Diagnosis present

## 2012-08-24 DIAGNOSIS — E282 Polycystic ovarian syndrome: Secondary | ICD-10-CM

## 2012-08-24 DIAGNOSIS — K219 Gastro-esophageal reflux disease without esophagitis: Secondary | ICD-10-CM | POA: Diagnosis present

## 2012-08-24 DIAGNOSIS — K2100 Gastro-esophageal reflux disease with esophagitis, without bleeding: Secondary | ICD-10-CM

## 2012-08-24 DIAGNOSIS — R030 Elevated blood-pressure reading, without diagnosis of hypertension: Secondary | ICD-10-CM | POA: Diagnosis present

## 2012-08-24 DIAGNOSIS — Z6841 Body Mass Index (BMI) 40.0 and over, adult: Secondary | ICD-10-CM

## 2012-08-24 DIAGNOSIS — F419 Anxiety disorder, unspecified: Secondary | ICD-10-CM | POA: Diagnosis present

## 2012-08-24 DIAGNOSIS — F411 Generalized anxiety disorder: Secondary | ICD-10-CM | POA: Diagnosis present

## 2012-08-24 DIAGNOSIS — Z79899 Other long term (current) drug therapy: Secondary | ICD-10-CM

## 2012-08-24 DIAGNOSIS — IMO0001 Reserved for inherently not codable concepts without codable children: Secondary | ICD-10-CM | POA: Diagnosis present

## 2012-08-24 DIAGNOSIS — F329 Major depressive disorder, single episode, unspecified: Secondary | ICD-10-CM | POA: Diagnosis present

## 2012-08-24 DIAGNOSIS — E039 Hypothyroidism, unspecified: Secondary | ICD-10-CM | POA: Diagnosis present

## 2012-08-24 HISTORY — PX: UPPER GI ENDOSCOPY: SHX6162

## 2012-08-24 HISTORY — PX: GASTRIC ROUX-EN-Y: SHX5262

## 2012-08-24 LAB — HEMOGLOBIN AND HEMATOCRIT, BLOOD: HCT: 35 % — ABNORMAL LOW (ref 36.0–46.0)

## 2012-08-24 SURGERY — LAPAROSCOPIC ROUX-EN-Y GASTRIC
Anesthesia: General | Wound class: Clean Contaminated

## 2012-08-24 MED ORDER — LIDOCAINE HCL (CARDIAC) 20 MG/ML IV SOLN
INTRAVENOUS | Status: DC | PRN
  Administered 2012-08-24: 100 mg via INTRAVENOUS

## 2012-08-24 MED ORDER — ENOXAPARIN SODIUM 40 MG/0.4ML ~~LOC~~ SOLN
40.0000 mg | Freq: Two times a day (BID) | SUBCUTANEOUS | Status: DC
  Administered 2012-08-25 – 2012-08-26 (×3): 40 mg via SUBCUTANEOUS
  Filled 2012-08-24 (×5): qty 0.4

## 2012-08-24 MED ORDER — ONDANSETRON HCL 4 MG/2ML IJ SOLN
INTRAMUSCULAR | Status: DC | PRN
  Administered 2012-08-24: 4 mg via INTRAVENOUS

## 2012-08-24 MED ORDER — DEXTROSE 5 % IV SOLN
2.0000 g | INTRAVENOUS | Status: AC
  Administered 2012-08-24: 2 g via INTRAVENOUS
  Filled 2012-08-24: qty 2

## 2012-08-24 MED ORDER — DEXAMETHASONE SODIUM PHOSPHATE 10 MG/ML IJ SOLN
INTRAMUSCULAR | Status: DC | PRN
  Administered 2012-08-24: 10 mg via INTRAVENOUS

## 2012-08-24 MED ORDER — PROPOFOL 10 MG/ML IV BOLUS
INTRAVENOUS | Status: DC | PRN
  Administered 2012-08-24: 50 mg via INTRAVENOUS
  Administered 2012-08-24: 250 mg via INTRAVENOUS

## 2012-08-24 MED ORDER — MORPHINE SULFATE 2 MG/ML IJ SOLN
2.0000 mg | INTRAMUSCULAR | Status: DC | PRN
  Administered 2012-08-24 – 2012-08-25 (×10): 4 mg via INTRAVENOUS
  Filled 2012-08-24 (×10): qty 2

## 2012-08-24 MED ORDER — NEOSTIGMINE METHYLSULFATE 1 MG/ML IJ SOLN
INTRAMUSCULAR | Status: DC | PRN
  Administered 2012-08-24: 5 mg via INTRAVENOUS

## 2012-08-24 MED ORDER — LACTATED RINGERS IV SOLN: INTRAVENOUS | Status: DC

## 2012-08-24 MED ORDER — FENTANYL CITRATE 0.05 MG/ML IJ SOLN
INTRAMUSCULAR | Status: DC | PRN
  Administered 2012-08-24 (×7): 50 ug via INTRAVENOUS

## 2012-08-24 MED ORDER — LACTATED RINGERS IR SOLN
Status: DC | PRN
  Administered 2012-08-24: 3000 mL

## 2012-08-24 MED ORDER — UNJURY VANILLA POWDER: 2.0000 [oz_av] | Freq: Four times a day (QID) | ORAL | Status: DC

## 2012-08-24 MED ORDER — GLYCOPYRROLATE 0.2 MG/ML IJ SOLN
INTRAMUSCULAR | Status: DC | PRN
  Administered 2012-08-24: .6 mg via INTRAVENOUS

## 2012-08-24 MED ORDER — MIDAZOLAM HCL 5 MG/5ML IJ SOLN
INTRAMUSCULAR | Status: DC | PRN
  Administered 2012-08-24: 2 mg via INTRAVENOUS

## 2012-08-24 MED ORDER — BIOTENE DRY MOUTH MT LIQD
15.0000 mL | Freq: Two times a day (BID) | OROMUCOSAL | Status: DC
Start: 2012-08-24 — End: 2012-08-26
  Administered 2012-08-25 – 2012-08-26 (×3): 15 mL via OROMUCOSAL

## 2012-08-24 MED ORDER — LABETALOL HCL 5 MG/ML IV SOLN
INTRAVENOUS | Status: DC | PRN
  Administered 2012-08-24 (×2): 5 mg via INTRAVENOUS

## 2012-08-24 MED ORDER — 0.9 % SODIUM CHLORIDE (POUR BTL) OPTIME
TOPICAL | Status: DC | PRN
  Administered 2012-08-24: 1000 mL

## 2012-08-24 MED ORDER — HEPARIN SODIUM (PORCINE) 5000 UNIT/ML IJ SOLN
5000.0000 [IU] | INTRAMUSCULAR | Status: AC
  Administered 2012-08-24: 5000 [IU] via SUBCUTANEOUS
  Filled 2012-08-24: qty 1

## 2012-08-24 MED ORDER — OXYCODONE-ACETAMINOPHEN 5-325 MG/5ML PO SOLN
5.0000 mL | ORAL | Status: DC | PRN
  Administered 2012-08-25: 5 mL via ORAL
  Filled 2012-08-24 (×2): qty 5

## 2012-08-24 MED ORDER — UNJURY CHICKEN SOUP POWDER: 2.0000 [oz_av] | Freq: Four times a day (QID) | ORAL | Status: DC

## 2012-08-24 MED ORDER — ACETAMINOPHEN 10 MG/ML IV SOLN
1000.0000 mg | Freq: Four times a day (QID) | INTRAVENOUS | Status: AC
  Administered 2012-08-24 – 2012-08-25 (×4): 1000 mg via INTRAVENOUS
  Filled 2012-08-24 (×6): qty 100

## 2012-08-24 MED ORDER — LACTATED RINGERS IV SOLN
INTRAVENOUS | Status: DC | PRN
  Administered 2012-08-24 (×2): via INTRAVENOUS

## 2012-08-24 MED ORDER — SUCCINYLCHOLINE CHLORIDE 20 MG/ML IJ SOLN
INTRAMUSCULAR | Status: DC | PRN
  Administered 2012-08-24: 100 mg via INTRAVENOUS

## 2012-08-24 MED ORDER — ACETAMINOPHEN 160 MG/5ML PO SOLN: 650.0000 mg | ORAL | Status: DC | PRN

## 2012-08-24 MED ORDER — HYDROMORPHONE HCL PF 1 MG/ML IJ SOLN
INTRAMUSCULAR | Status: DC | PRN
  Administered 2012-08-24 (×2): 0.5 mg via INTRAVENOUS
  Administered 2012-08-24: 1 mg via INTRAVENOUS

## 2012-08-24 MED ORDER — UNJURY CHOCOLATE CLASSIC POWDER
2.0000 [oz_av] | Freq: Four times a day (QID) | ORAL | Status: DC
  Administered 2012-08-26: 2 [oz_av] via ORAL

## 2012-08-24 MED ORDER — BUPIVACAINE-EPINEPHRINE 0.25% -1:200000 IJ SOLN
INTRAMUSCULAR | Status: DC | PRN
  Administered 2012-08-24: 25 mL

## 2012-08-24 MED ORDER — TISSEEL VH 10 ML EX KIT
PACK | CUTANEOUS | Status: DC | PRN
  Administered 2012-08-24 (×2): 10 mL

## 2012-08-24 MED ORDER — ONDANSETRON HCL 4 MG/2ML IJ SOLN
4.0000 mg | INTRAMUSCULAR | Status: DC | PRN
  Administered 2012-08-24 – 2012-08-25 (×6): 4 mg via INTRAVENOUS
  Filled 2012-08-24 (×6): qty 2

## 2012-08-24 MED ORDER — PANTOPRAZOLE SODIUM 40 MG IV SOLR
40.0000 mg | INTRAVENOUS | Status: DC
  Administered 2012-08-24 – 2012-08-25 (×2): 40 mg via INTRAVENOUS
  Filled 2012-08-24 (×3): qty 40

## 2012-08-24 MED ORDER — KCL IN DEXTROSE-NACL 20-5-0.45 MEQ/L-%-% IV SOLN
INTRAVENOUS | Status: DC
  Administered 2012-08-24 – 2012-08-25 (×3): via INTRAVENOUS
  Filled 2012-08-24 (×8): qty 1000

## 2012-08-24 MED ORDER — HYDROMORPHONE HCL PF 1 MG/ML IJ SOLN
0.2500 mg | INTRAMUSCULAR | Status: DC | PRN
  Administered 2012-08-24: 0.25 mg via INTRAVENOUS
  Administered 2012-08-24: 0.5 mg via INTRAVENOUS
  Administered 2012-08-24: 0.25 mg via INTRAVENOUS

## 2012-08-24 MED ORDER — ROCURONIUM BROMIDE 100 MG/10ML IV SOLN
INTRAVENOUS | Status: DC | PRN
  Administered 2012-08-24 (×4): 10 mg via INTRAVENOUS
  Administered 2012-08-24: 50 mg via INTRAVENOUS
  Administered 2012-08-24: 10 mg via INTRAVENOUS

## 2012-08-24 SURGICAL SUPPLY — 80 items
APPLICATOR COTTON TIP 6IN STRL (MISCELLANEOUS) IMPLANT
APPLIER CLIP ROT 13.4 12 LRG (CLIP) ×3
BENZOIN TINCTURE PRP APPL 2/3 (GAUZE/BANDAGES/DRESSINGS) IMPLANT
BLADE SURG 15 STRL LF DISP TIS (BLADE) IMPLANT
BLADE SURG 15 STRL SS (BLADE)
BLADE SURG SZ11 CARB STEEL (BLADE) ×3 IMPLANT
CABLE HIGH FREQUENCY MONO STRZ (ELECTRODE) ×3 IMPLANT
CANISTER SUCTION 2500CC (MISCELLANEOUS) ×3 IMPLANT
CLIP APPLIE ROT 13.4 12 LRG (CLIP) ×2 IMPLANT
CLIP SUT LAPRA TY ABSORB (SUTURE) ×6 IMPLANT
CLOTH BEACON ORANGE TIMEOUT ST (SAFETY) ×3 IMPLANT
COVER SURGICAL LIGHT HANDLE (MISCELLANEOUS) ×6 IMPLANT
CUTTER LINEAR ENDO ART 45 ETS (STAPLE) IMPLANT
DERMABOND ADVANCED (GAUZE/BANDAGES/DRESSINGS) ×1
DERMABOND ADVANCED .7 DNX12 (GAUZE/BANDAGES/DRESSINGS) ×2 IMPLANT
DEVICE SUTURE ENDOST 10MM (ENDOMECHANICALS) ×3 IMPLANT
DISSECTOR BLUNT TIP ENDO 5MM (MISCELLANEOUS) IMPLANT
DRAIN PENROSE 18X1/4 LTX STRL (WOUND CARE) ×3 IMPLANT
DRAPE CAMERA CLOSED 9X96 (DRAPES) ×3 IMPLANT
DRAPE UTILITY XL STRL (DRAPES) ×3 IMPLANT
ELECT REM PT RETURN 9FT ADLT (ELECTROSURGICAL) ×3
ELECTRODE REM PT RTRN 9FT ADLT (ELECTROSURGICAL) ×2 IMPLANT
GAUZE SPONGE 4X4 16PLY XRAY LF (GAUZE/BANDAGES/DRESSINGS) ×3 IMPLANT
GLOVE BIO SURGEON STRL SZ7.5 (GLOVE) ×3 IMPLANT
GLOVE BIOGEL M STRL SZ7.5 (GLOVE) IMPLANT
GLOVE BIOGEL PI IND STRL 7.0 (GLOVE) ×2 IMPLANT
GLOVE BIOGEL PI INDICATOR 7.0 (GLOVE) ×1
GLOVE INDICATOR 8.0 STRL GRN (GLOVE) ×3 IMPLANT
GOWN STRL NON-REIN LRG LVL3 (GOWN DISPOSABLE) ×6 IMPLANT
GOWN STRL REIN XL XLG (GOWN DISPOSABLE) ×12 IMPLANT
HEMOSTAT SURGICEL 4X8 (HEMOSTASIS) IMPLANT
HOVERMATT SINGLE USE (MISCELLANEOUS) ×3 IMPLANT
KIT BASIN OR (CUSTOM PROCEDURE TRAY) ×3 IMPLANT
KIT GASTRIC LAVAGE 34FR ADT (SET/KITS/TRAYS/PACK) ×3 IMPLANT
MARKER SKIN DUAL TIP RULER LAB (MISCELLANEOUS) ×3 IMPLANT
NEEDLE SPNL 22GX3.5 QUINCKE BK (NEEDLE) ×3 IMPLANT
NS IRRIG 1000ML POUR BTL (IV SOLUTION) ×3 IMPLANT
PACK CARDIOVASCULAR III (CUSTOM PROCEDURE TRAY) ×3 IMPLANT
POUCH SPECIMEN RETRIEVAL 10MM (ENDOMECHANICALS) IMPLANT
RELOAD 45 VASCULAR/THIN (ENDOMECHANICALS) ×3 IMPLANT
RELOAD BLUE (STAPLE) ×12 IMPLANT
RELOAD ENDO STITCH 2.0 (ENDOMECHANICALS) ×12
RELOAD GOLD (STAPLE) ×3 IMPLANT
RELOAD STAPLE TA45 3.5 REG BLU (ENDOMECHANICALS) ×3 IMPLANT
RELOAD WHITE ECR60W (STAPLE) ×3 IMPLANT
SCALPEL HARMONIC ACE (MISCELLANEOUS) ×3 IMPLANT
SCISSORS LAP 5X35 DISP (ENDOMECHANICALS) ×3 IMPLANT
SEALANT SURGICAL APPL DUAL CAN (MISCELLANEOUS) ×3 IMPLANT
SET IRRIG TUBING LAPAROSCOPIC (IRRIGATION / IRRIGATOR) ×3 IMPLANT
SLEEVE ADV FIXATION 12X100MM (TROCAR) ×6 IMPLANT
SLEEVE ADV FIXATION 5X100MM (TROCAR) ×3 IMPLANT
SLEEVE ENDOPATH XCEL 5M (ENDOMECHANICALS) ×3 IMPLANT
SOLUTION ANTI FOG 6CC (MISCELLANEOUS) ×3 IMPLANT
SPONGE GAUZE 4X4 12PLY (GAUZE/BANDAGES/DRESSINGS) ×3 IMPLANT
STAPLE ECHEON FLEX 60 POW ENDO (STAPLE) ×3 IMPLANT
STAPLER VISISTAT 35W (STAPLE) ×3 IMPLANT
STRIP CLOSURE SKIN 1/2X4 (GAUZE/BANDAGES/DRESSINGS) IMPLANT
SUT ETHILON 3 0 PS 1 (SUTURE) IMPLANT
SUT MNCRL AB 4-0 PS2 18 (SUTURE) ×6 IMPLANT
SUT RELOAD ENDO STITCH 2 48X1 (ENDOMECHANICALS) ×14
SUT RELOAD ENDO STITCH 2.0 (ENDOMECHANICALS) ×10
SUT SILK 2 0 SH (SUTURE) IMPLANT
SUT VIC AB 2-0 SH 27 (SUTURE)
SUT VIC AB 2-0 SH 27X BRD (SUTURE) IMPLANT
SUT VIC AB 3-0 SH 27 (SUTURE) ×1
SUT VIC AB 3-0 SH 27X BRD (SUTURE) ×2 IMPLANT
SUTURE RELOAD END STTCH 2 48X1 (ENDOMECHANICALS) ×14 IMPLANT
SUTURE RELOAD ENDO STITCH 2.0 (ENDOMECHANICALS) ×10 IMPLANT
SYR 20CC LL (SYRINGE) ×3 IMPLANT
SYR 50ML LL SCALE MARK (SYRINGE) ×3 IMPLANT
SYR CONTROL 10ML LL (SYRINGE) ×3 IMPLANT
TOWEL OR 17X26 10 PK STRL BLUE (TOWEL DISPOSABLE) ×3 IMPLANT
TRAY FOLEY CATH 14FRSI W/METER (CATHETERS) ×3 IMPLANT
TROCAR BALLN 12MMX100 BLUNT (TROCAR) ×3 IMPLANT
TROCAR BLADELESS OPT 5 100 (ENDOMECHANICALS) ×3 IMPLANT
TROCAR ENDOPATH XCEL 12X100 BL (ENDOMECHANICALS) ×9 IMPLANT
TROCAR XCEL 12X100 BLDLESS (ENDOMECHANICALS) ×3 IMPLANT
TUBING ENDO SMARTCAP (MISCELLANEOUS) ×3 IMPLANT
TUBING FILTER THERMOFLATOR (ELECTROSURGICAL) ×3 IMPLANT
WATER STERILE IRR 1500ML POUR (IV SOLUTION) ×3 IMPLANT

## 2012-08-24 NOTE — Op Note (Signed)
Name:  [name] [name] MRN: [id number] Date of Surgery: 08/24/2012  Preop Diagnosis:  Morbid Obesity, S/P RYGB  Postop Diagnosis:  Morbid Obesity, S/P RYGB  Procedure:  Upper endoscopy  (Intraoperative)  Surgeon:  Ovidio Kin, M.D.  Anesthesia:  GET  Indications for procedure: [name] [name] is a 27 y.o. female whose primary care physician is Rene Paci, MD and has completed a Roux-en-Y gastric bypass today by Dr. Andrey Campanile.  I am doing an intraoperative upper endoscopy to evaluate the gastric pouch and the gastro-jejunal anastomosis.  Operative Note: The patient is under general anesthesia in room #1 at [facility].  Dr. Andrey Campanile is laparoscoping the patient while I do an upper endoscopy to evaluate the stomach pouch and gastrojejunal anastomosis.  With the patient intubated, I passed the Olympus endoscope without difficulty down the esophagus.  The esophago-gastric junction was at 37 cm.  The gastro-jejunal anastomosis was at 42 cm.  The mucosa of the stomach looked viable and the staple line was intact without bleeding.  The gastro-jejunal anastomosis looked okay.  While I insufflated the stomach pouch with air, Dr. Andrey Campanile clamped off the efferent limb of the jejunum.  He then flooded the upper abdomen with saline to put the gastric pouch and gastro-jejunal anastomosis under saline.  There was no bubbling or evidence of a leak.  Photos were taken of the anastomosis.  The scope was then withdrawn.  The esophagus was unremarkable and the patient tolerated the endoscopy without difficulty.  Ovidio Kin, MD, [facility] Pager: [phone] Office phone:  [phone]

## 2012-08-24 NOTE — Anesthesia Postprocedure Evaluation (Signed)
  Anesthesia Post-op Note  Patient: [name] [name]  Procedure(s) Performed: Procedure(s) (LRB): LAPAROSCOPIC ROUX-EN-Y GASTRIC (N/A) UPPER GI ENDOSCOPY  Patient Location: PACU  Anesthesia Type: General  Level of Consciousness: awake and alert   Airway and Oxygen Therapy: Patient Spontanous Breathing  Post-op Pain: mild  Post-op Assessment: Post-op Vital signs reviewed, Patient's Cardiovascular Status Stable, Respiratory Function Stable, Patent Airway and No signs of Nausea or vomiting  Last Vitals:  Filed Vitals:   08/24/12 1230  BP: 160/84  Pulse: 84  Temp: 36.7 C  Resp: 16    Post-op Vital Signs: stable   Complications: No apparent anesthesia complications

## 2012-08-24 NOTE — Interval H&P Note (Signed)
History and Physical Interval Note:  08/24/2012 7:32 AM  [name] [name]  has presented today for surgery, with the diagnosis of morbid obesity  The various methods of treatment have been discussed with the patient and family. After consideration of risks, benefits and other options for treatment, the patient has consented to  Procedure(s) with comments: LAPAROSCOPIC ROUX-EN-Y GASTRIC (N/A) - laparoscopic roux-en-y gastric bypass as a surgical intervention .  The patient's history has been reviewed, patient examined, no change in status, stable for surgery.  I have reviewed the patient's chart and labs.  Questions were answered to the patient's satisfaction.    [name] Andrey Campanile, MD, FACS General, Bariatric, & Minimally Invasive Surgery [facility], [place]   [facility]

## 2012-08-24 NOTE — Op Note (Signed)
Preoperative Diagnosis: Morbid obesity BMI 50.49  PCOS  GERD - no meds  H/o hypothyroidism  Depression  H/o pregnancy induced hypertension  Postoperative diagnosis:  1. same  Surgical procedure: Laparoscopic Roux-en-Y gastric bypass (ante-colic, ante-gastric); upper endoscopy  Surgeon: Atilano Ina, M.D. FACS  Asst.: Ovidio Kin, MD FACS  Anesthesia: General plus 0.25% marcaine with epi  Complications: None   EBL: Minimal   Drains: None   Disposition: PACU in good condition   Indications for procedure: 26yo WF with morbid obesity who has been unsuccessful at sustained weight loss. The patient's comorbidities are listed above. We discussed the risk and benefits of surgery including but not limited to anesthesia risk, bleeding, infection, blood clot formation, anastomotic leak, anastomotic stricture, ulcer formation, death, respiratory complications, intestinal blockage, internal hernia, gallstone formation, vitamin and nutritional deficiencies, injury to surrounding structures, failure to lose weight and mood changes.   Description of procedure: Patient is brought to the operating room and general anesthesia induced. The patient had received preoperative broad-spectrum IV antibiotics and subcutaneous heparin. The abdomen was widely sterilely prepped with Chloraprep and draped. Patient timeout was performed and correct patient and procedure confirmed. Access was obtained with a 12 mm Optiview trocar in the left upper quadrant and pneumoperitoneum established without difficulty. Under direct vision 12 mm trocars were placed laterally in the right upper quadrant, right upper quadrant midclavicular line, and to the left and above the umbilicus for the camera port. A 5 mm trocar was placed laterally in the left upper quadrant. The omentum was brought into the upper abdomen and the transverse mesocolon elevated and the ligament of Treitz clearly identified. A 40 cm biliopancreatic limb was  then carefully measured from the ligament of Treitz. The small intestine was divided at this point with a single firing of the white load linear stapler. A Penrose drain was sutured to the end of the Roux-en-Y limb for later identification. A 100 cm Roux-en-Y limb was then carefully measured. At this point a side-to-side anastomosis was created between the Roux limb and the end of the biliopancreatic limb. This was accomplished with a single firing of the 45 mm white load linear stapler. The common enterotomy was closed with a running 2-0 Vicryl begun at either end of the enterotomy and tied centrally. Tisseel tissue sealant was applied over the anastomosis. The mesenteric defect was then closed with running 2-0 silk. The omentum was then divided with the harmonic scalpel up towards the transverse colon to allow mobility of the Roux limb toward the gastric pouch. The falciform ligament was taken down with the Harmonic scalpel since it was in the way. The patient was then placed in steep reversed Trendelenburg. Through a 5 mm subxiphoid site the [facility] retractor was placed and the left lobe of the liver elevated with excellent exposure of the upper stomach and hiatus. The angle of Hiss was then mobilized with the harmonic scalpel. A 5cm gastric pouch was then carefully measured along the lesser curve of the stomach. Dissection was carried along the lesser curve at this point with the Harmonic scalpel working carefully back toward the lesser sac at right angles to the lesser curve. The free lesser sac was then entered. After being sure all tubes were removed from the stomach an initial firing of the gold load 60 mm linear stapler was fired at right angles across the lesser curve for about 4 cm. The gastric pouch was further mobilized posteriorly and then the pouch was completed with 4 further  firings of the 60 mm blue load linear stapler  up through the previously dissected angle of His. It was ensured that the  pouch was completely mobilized away from the gastric remnant. This created a nice tubular 5 cm gastric pouch.  The Roux limb was then brought up in an antecolic fashion with the candycane facing to the patient's left without undue tension. The gastrojejunostomy was created with an initial posterior row of 2-0 Vicryl between the Roux limb and the staple line of the gastric pouch. Enterotomies were then made in the gastric pouch and the Roux limb with the harmonic scalpel and at approximately 2-2-1/2 cm anastomosis was created with a single firing of the 45mm blue load linear stapler. The staple line was inspected and was intact without bleeding. The common enterotomy was then closed with running 2-0 Vicryl begun at either end and tied centrally. The Ewall tube was then easily passed through the anastomosis and an outer anterior layer of running 2-0 Vicryl was placed. The Ewald tube was removed. With the outlet of the gastrojejunostomy clamped and under saline irrigation the assistant performed upper endoscopy and with the gastric pouch tensely distended with air-there was no evidence of leak on this test. The pouch was desufflated. The Vonita Moss defect was closed with running 2-0 silk. There was a bleeder along the remnant stomach staple line which was clipped using 3 ligamax clips.  The abdomen was inspected for any evidence of bleeding or bowel injury and everything looked fine. The Nathanson retractor was removed under direct vision after coating the anastomosis with Tisseel tissue sealant. All CO2 was evacuated and trochars removed. Skin incisions were closed with 4-0 monocryl suture in a subcuticular fashion followed by dermabond.Marland Kitchen Sponge needle and instrument counts were correct. The patient was taken to the PACU in good condition.    [name] Andrey Campanile, MD, FACS General, Bariatric, & Minimally Invasive Surgery [facility], [place]

## 2012-08-24 NOTE — Preoperative (Signed)
Beta Blockers   Reason not to administer Beta Blockers:Not Applicable 

## 2012-08-24 NOTE — H&P (View-Only) (Signed)
Patient ID: [name] [name], female   DOB: 1985-08-22, 27 y.o.   MRN: [id number]  Chief Complaint  Patient presents with  . Bariatric Pre-op    RNY scheduled 08/24/2012    HPI [name] [name] is a 27 y.o. female.   HPI 27 yo morbidly obese WF was referred by Dr Felicity Coyer for evaluation of weight loss surgery, specifically gastric bypass.  The patient states that she is from of all of her life with her weight. Despite numerous attempts for sustained weight loss she has been unsuccessful. She has tried the [employer], Weight Watchers, Slim fast, phentermine, as well as curves all without any long-term success. She initially had fertility issues while trying to get pregnant several years ago. Her pregnancies were also complicated by hypertension due to her obesity. 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  She has attended 2 weight loss seminars and has done extensive research regarding gastric bypass surgery. Moreover she has had numerous family members who have undergone laparoscopic Roux-en-Y gastric bypass. Her mother, uncle, and, and another uncle all have had gastric bypass surgery and have done well.  I initially met the patient on 06/23/2012. She has been approved for gastric bypass surgery. She comes in today for her preoperative appointment. She denies any changes since her last visit. She started on her preoperative diet.   Past Medical History  Diagnosis Date  . HYPOTHYROIDISM   . OBESITY   . DEPRESSION   . Anxiety   . Infertility, female hx   . Pregnancy induced hypertension   . Anemia     after 1st delivery  . GERD (gastroesophageal reflux disease)     pregnancy related- no meds  . Kidney stones     recurrent  . PCOS (polycystic ovarian syndrome)   . Morbid obesity     Past Surgical History  Procedure Laterality Date  . Cesarean section      x2  . Tonsillectomy    . Wisdom tooth extraction    . Tubal ligation      Family History  Problem  Relation Age of Onset  . Diabetes Other   . Hyperlipidemia Other   . Hypertension Other   . Hypertension Mother   . Hyperlipidemia Father   . Hypertension Father   . Kidney disease Father   . Cancer Father   . Hypertension Maternal Grandmother   . Diabetes Maternal Grandfather     Social History History  Substance Use Topics  . Smoking status: Never Smoker   . Smokeless tobacco: Never Used  . Alcohol Use: No    No Known Allergies  Current Outpatient Prescriptions  Medication Sig Dispense Refill  . diphenhydrAMINE (SOMINEX) 25 MG tablet Take 75 mg by mouth at bedtime as needed for sleep.       Marland Kitchen venlafaxine XR (EFFEXOR-XR) 75 MG 24 hr capsule Take 75 mg by mouth daily before breakfast.      . zolpidem (AMBIEN) 10 MG tablet Take 10 mg by mouth at bedtime as needed for sleep.      . peg 3350 powder (MOVIPREP) 100 G SOLR Take 1 kit (100 g total) by mouth once.  1 kit  0   No current facility-administered medications for this visit.   Facility-Administered Medications Ordered in Other Visits  Medication Dose Route Frequency Provider Last Rate Last Dose  . ceFAZolin (ANCEF) injection 2 g  2 g Intramuscular Once         Review of  Systems Review of Systems  Constitutional: Negative for fever, activity change, appetite change, fatigue and unexpected weight change.  HENT: Negative for hearing loss, nosebleeds, congestion, neck pain and neck stiffness.   Eyes: Negative for photophobia and visual disturbance.  Respiratory: Negative for apnea, cough, chest tightness, shortness of breath, wheezing and stridor.   Cardiovascular: Negative for chest pain, palpitations and leg swelling.       Denies CP, SOB, orthopnea, PND  Gastrointestinal: Negative for nausea, vomiting, abdominal pain, diarrhea, blood in stool and abdominal distention.       Some heartburn on occasion with certain foods. BM every other day. Doesn't take reflux meds  Genitourinary: Negative for dysuria, urgency,  frequency, flank pain, vaginal bleeding, difficulty urinating and pelvic pain.       G2P2; had fertility issues initially; has had some irregular periods. S/p tubal  Musculoskeletal: Negative for back pain, arthralgias and gait problem.  Neurological: Negative for tremors, seizures, syncope, light-headedness, numbness and headaches.  Hematological: Negative for adenopathy. Does not bruise/bleed easily.    Blood pressure 106/70, pulse 64, resp. rate 14, height 5\' 3"  (1.6 m), weight 285 lb (129.275 kg).  Physical Exam Physical Exam  Vitals reviewed. Constitutional: She is oriented to person, place, and time. She appears well-developed and well-nourished. No distress.  Morbidly obese  HENT:  Head: Normocephalic and atraumatic.  Right Ear: External ear normal.  Left Ear: External ear normal.  Eyes: Conjunctivae are normal. No scleral icterus.  Neck: Normal range of motion. Neck supple. No tracheal deviation present. No thyromegaly present.  Cardiovascular: Normal rate, regular rhythm and intact distal pulses.   No murmur heard. Pulmonary/Chest: Effort normal and breath sounds normal. No respiratory distress. She has no wheezes.  Abdominal: Soft. Bowel sounds are normal. She exhibits no distension. There is no tenderness. There is no rebound. No hernia. Hernia confirmed negative in the ventral area.    Pannicular Rash has resolved  Musculoskeletal: Normal range of motion. She exhibits no edema and no tenderness.  Lymphadenopathy:    She has no cervical adenopathy.  Neurological: She is alert and oriented to person, place, and time. She exhibits normal muscle tone.  Skin: Skin is warm and dry. She is not diaphoretic.  Psychiatric: She has a normal mood and affect. Her behavior is normal. Judgment and thought content normal.    Data Reviewed Dr Diamantina Monks note  U/s - fatty liver, 5mm echogenic kidney lesion UGI - 1 episode of reflux Labs - normal thyroid, cmet, cbc; LDL 119; TG  151  Assessment    Morbid obesity BMI 50.49 PCOS GERD - no meds H/o hypothyroidism Depression H/o pregnancy induced hypertension    Plan    We reviewed her preoperative workup including her upper GI, abdominal ultrasound, and preoperative labs. All of her questions were asked and answered. I discussed my experience with the procedure. She was given instructions for her preoperative bowel prep. I encouraged her to continue with her preoperative diet as well as getting regular exercise. She is currently scheduled for surgery on March 11  Kilian Schwartz M. Andrey Campanile, MD, FACS General, Bariatric, & Minimally Invasive Surgery [facility], [place]          [facility] M 08/12/2012, 9:28 AM

## 2012-08-24 NOTE — Transfer of Care (Signed)
Immediate Anesthesia Transfer of Care Note  Patient: [name] [name]  Procedure(s) Performed: Procedure(s) (LRB): LAPAROSCOPIC ROUX-EN-Y GASTRIC (N/A) UPPER GI ENDOSCOPY  Patient Location: PACU  Anesthesia Type: General  Level of Consciousness: sedated, patient cooperative and responds to stimulaton  Airway & Oxygen Therapy: Patient Spontanous Breathing and Patient connected to face mask oxgen  Post-op Assessment: Report given to PACU RN and Post -op Vital signs reviewed and stable  Post vital signs: Reviewed and stable  Complications: No apparent anesthesia complications

## 2012-08-25 ENCOUNTER — Inpatient Hospital Stay (HOSPITAL_COMMUNITY): Payer: 59

## 2012-08-25 ENCOUNTER — Ambulatory Visit: Payer: 59 | Admitting: Internal Medicine

## 2012-08-25 ENCOUNTER — Encounter (HOSPITAL_COMMUNITY): Payer: Self-pay | Admitting: General Surgery

## 2012-08-25 LAB — COMPREHENSIVE METABOLIC PANEL
ALT: 20 U/L (ref 0–35)
AST: 20 U/L (ref 0–37)
CO2: 28 mEq/L (ref 19–32)
Chloride: 103 mEq/L (ref 96–112)
Creatinine, Ser: 0.63 mg/dL (ref 0.50–1.10)
GFR calc non Af Amer: >90 mL/min (ref >90)
Glucose, Bld: 119 mg/dL — ABNORMAL HIGH (ref 70–99)
Sodium: 139 mEq/L (ref 135–145)
Total Bilirubin: 0.2 mg/dL — ABNORMAL LOW (ref 0.3–1.2)

## 2012-08-25 LAB — CBC WITH DIFFERENTIAL/PLATELET
Basophils Absolute: 0 10*3/uL (ref 0.0–0.1)
HCT: 33.4 % — ABNORMAL LOW (ref 36.0–46.0)
Hemoglobin: 10.8 g/dL — ABNORMAL LOW (ref 12.0–15.0)
Lymphocytes Relative: 10 % — ABNORMAL LOW (ref 12–46)
Lymphs Abs: 1.5 10*3/uL (ref 0.7–4.0)
Monocytes Absolute: 1 10*3/uL (ref 0.1–1.0)
Neutro Abs: 11.7 10*3/uL — ABNORMAL HIGH (ref 1.7–7.7)
RBC: 4.06 MIL/uL (ref 3.87–5.11)
RDW: 14.3 % (ref 11.5–15.5)
WBC: 14.2 10*3/uL — ABNORMAL HIGH (ref 4.0–10.5)

## 2012-08-25 IMAGING — CR DG UGI W/ GASTROGRAFIN
7 series · 13 of 13 positions shown · non-contrast
Comparison: [DATE]

CLINICAL DATA: Postop gastric bypass

UPPER GI SERIES WITH KUB
TECHNIQUE: Routine upper GI series was performed with water
soluble contrast
Fluoroscopy Time: 35 seconds

[Series 1: run · 7 of 7 slices shown (1 of 5)]
[im 1/7]
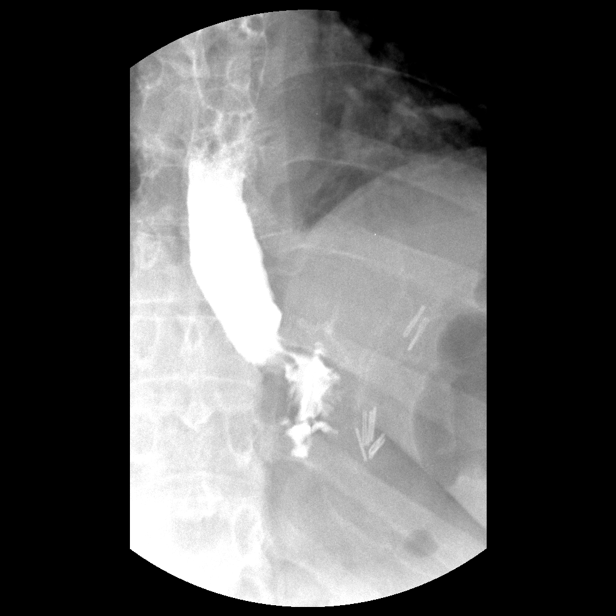
[im 2/7]
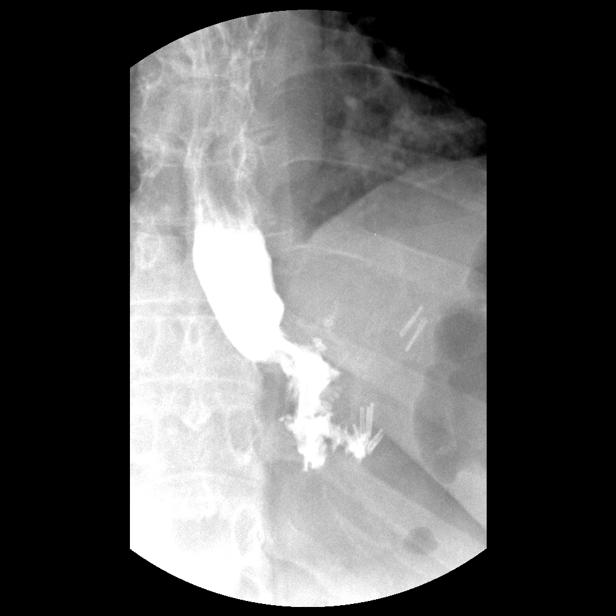
[im 3/7]
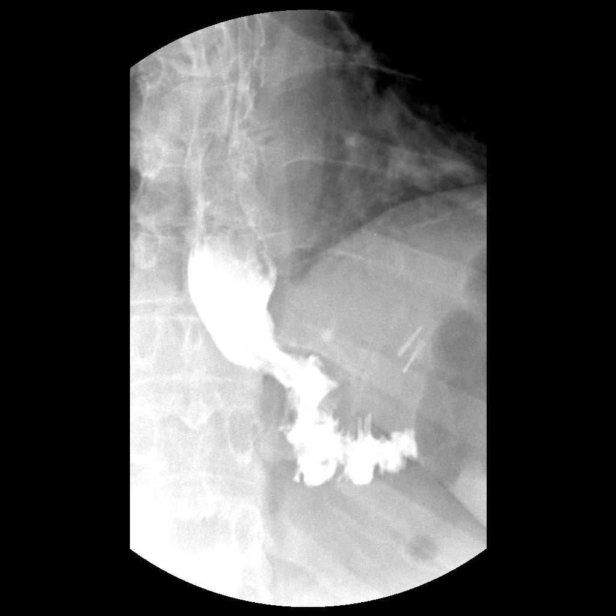
[im 4/7]
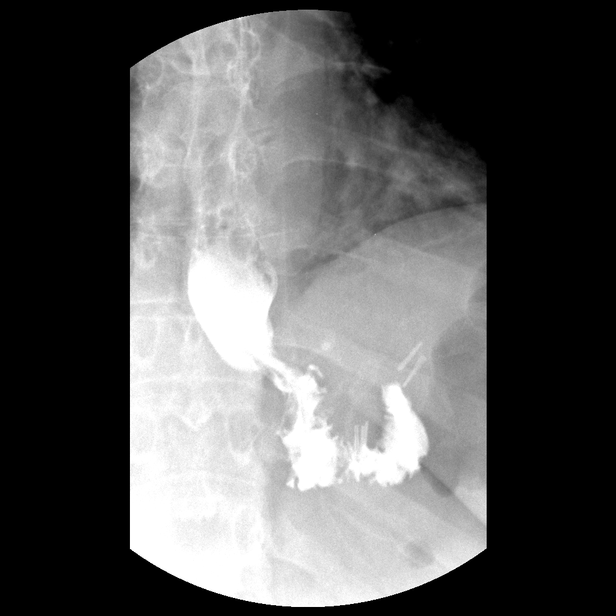
[im 5/7]
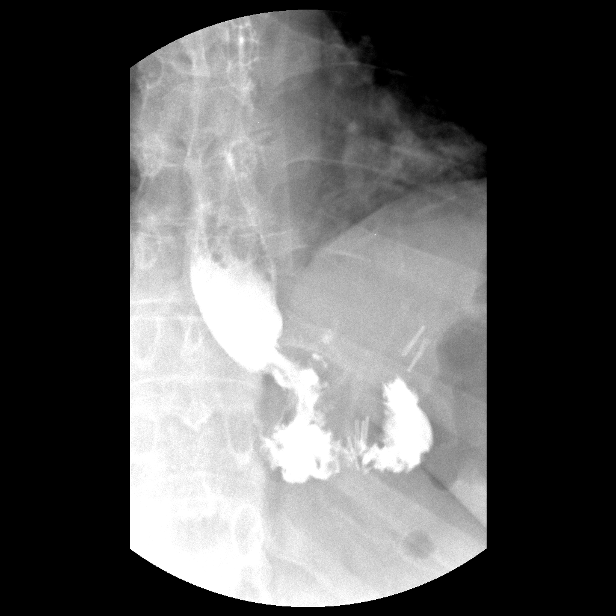
[im 6/7]
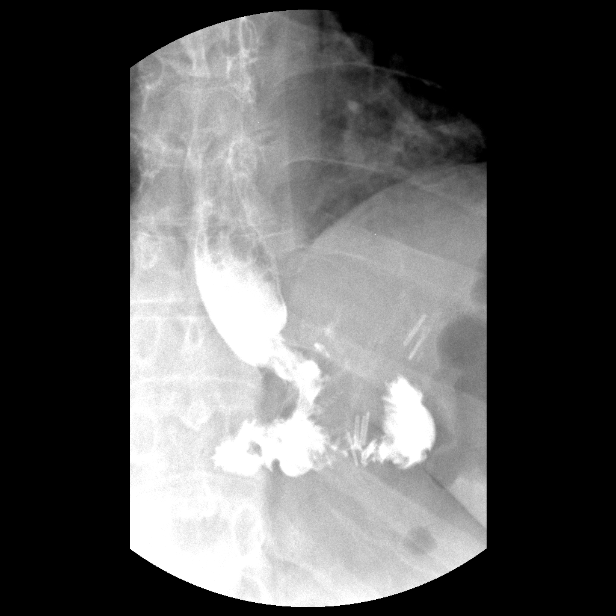
[im 7/7]
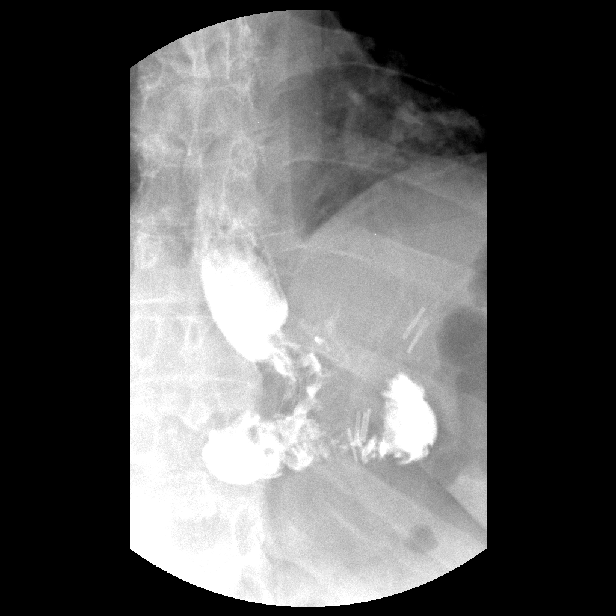

[run (2 of 5)]
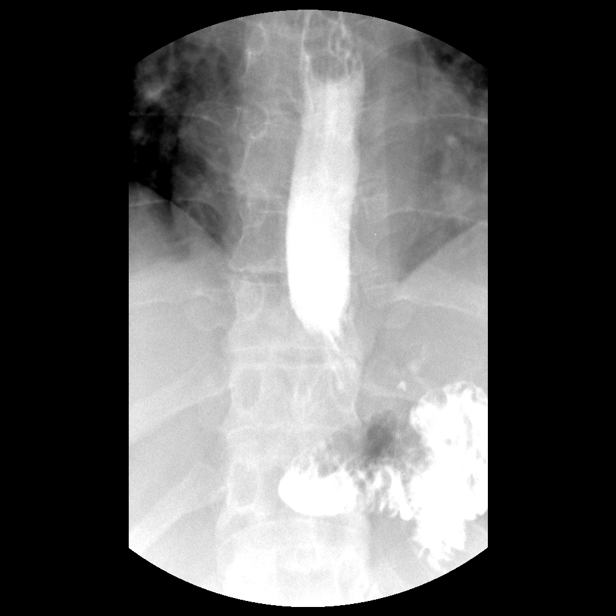

[run (3 of 5)]
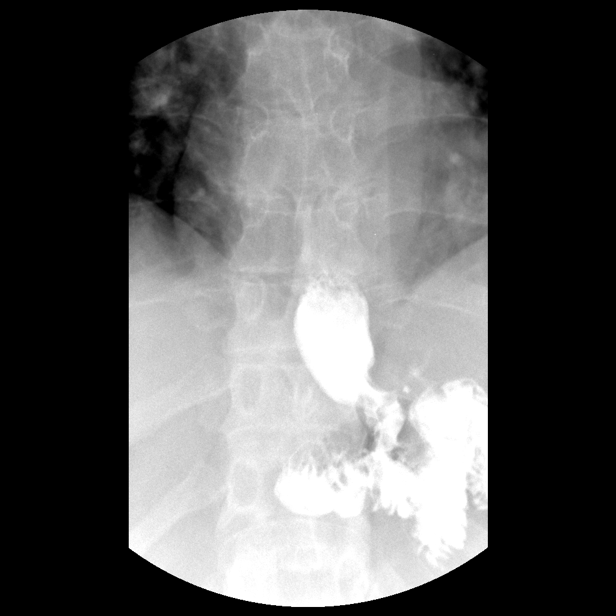

[run (4 of 5)]
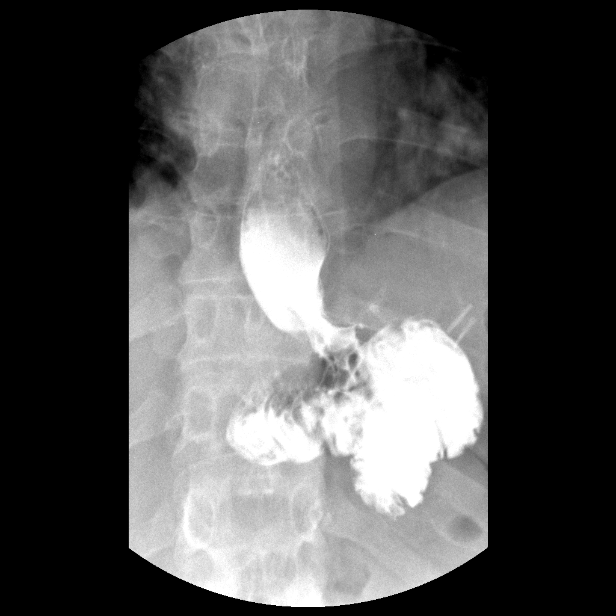

[run (5 of 5)]
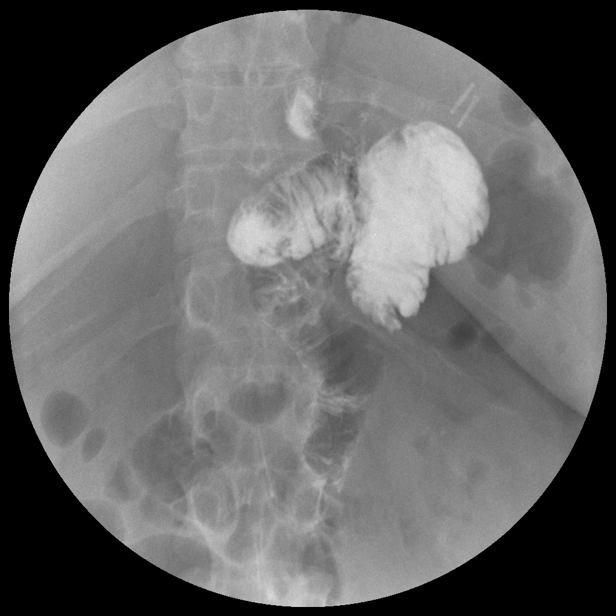

[view not recorded (1 of 2)]
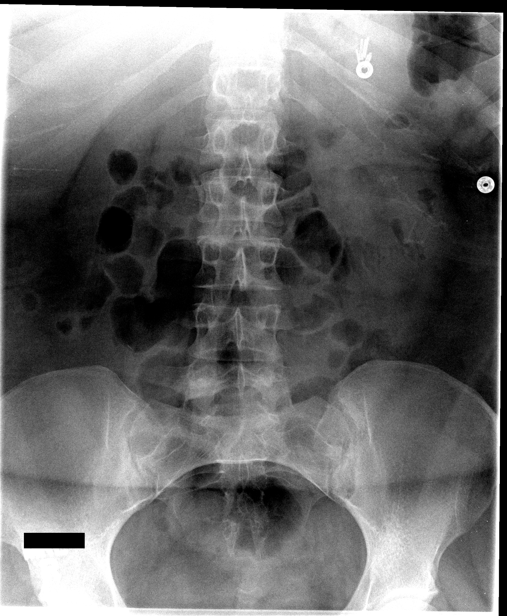

[view not recorded (2 of 2)]
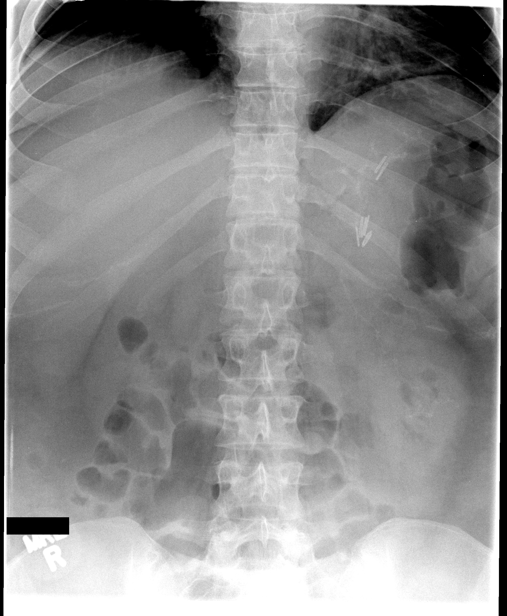

[13 of 13 positions shown; findings below may reference images not displayed]

FINDINGS: Initial scout radiograph demonstrates postsurgical
changes in the left upper abdomen.

Widely patent gastrojejunostomy.  No leak is seen.

The afferent limb and proximal efferent limb opacify and appear
unremarkable.
IMPRESSION: Patent gastrojejunostomy.

No leak is seen.

## 2012-08-25 MED ORDER — IOHEXOL 300 MG/ML  SOLN
50.0000 mL | Freq: Once | INTRAMUSCULAR | Status: AC | PRN
  Administered 2012-08-25: 30 mL via ORAL

## 2012-08-25 NOTE — Progress Notes (Signed)
Came to bedside to explain Link to Wellness program to patient as a benefit of being a [employer] employee with [employer]. No identifiable Link to Wellness needs at this time. However, left brochure and contact information with patient if could be of assistance in the future. Appreciative of the visit. Raiford Noble, MSN- Ed, RN,CM, [facility], [phone]

## 2012-08-25 NOTE — Progress Notes (Signed)
Patient is alert and oriented, doing well.  Vss.  Patient is ambulating in hallways, using incentive spirometry, and wearing compression hose while in bed.  Patient is complaining of neck soreness and minimal abdominal pain that is relieved with prn medication.  Patient is scheduled for UGI this am.  Discharge instruction given to patient and spouse to review, will go over in detail prior to discharge.  Quenton Fetter, Rn

## 2012-08-25 NOTE — Progress Notes (Signed)
UGI results negative, No leak seen.  Dr.Wilson notified, orders given to start Ice chips and water.  RN and patient notified.  Quenton Fetter, Rn

## 2012-08-25 NOTE — Care Management Note (Signed)
    Page 1 of 1   08/25/2012     11:52:26 AM   CARE MANAGEMENT NOTE 08/25/2012  Patient:  [name] [name], [name] [name]   Account Number:  [id number]  Date Initiated:  08/25/2012  Documentation initiated by:  Lorenda Ishihara  Subjective/Objective Assessment:   27 yo female admitted s/p lap gastric bypass. PTA lived at home with spouse.     Action/Plan:   Home when stable   Anticipated DC Date:  08/26/2012   Anticipated DC Plan:  HOME/SELF CARE      DC Planning Services  CM consult      Choice offered to / List presented to:             Status of service:  Completed, signed off Medicare Important Message given?  NA - LOS <3 / Initial given by admissions (If response is "NO", the following Medicare IM given date fields will be blank) Date Medicare IM given:   Date Additional Medicare IM given:    Discharge Disposition:  HOME/SELF CARE  Per UR Regulation:  Reviewed for med. necessity/level of care/duration of stay  If discussed at Long Length of Stay Meetings, dates discussed:    Comments:

## 2012-08-25 NOTE — Progress Notes (Signed)
1 Day Post-Op  Subjective: Doing well. Walked several times. C/o neck discomfort. No Nausea  Objective: Vital signs in last 24 hours: Temp:  [97.7 F (36.5 C)-98.5 F (36.9 C)] 98.2 F (36.8 C) (03/12 0523) Pulse Rate:  [71-105] 83 (03/12 0523) Resp:  [14-18] 18 (03/12 0523) BP: (138-168)/(77-97) 139/77 mmHg (03/12 0523) SpO2:  [88 %-100 %] 98 % (03/12 0523) Weight:  [281 lb 6 oz (127.631 kg)] 281 lb 6 oz (127.631 kg) (03/11 1230) Last BM Date: 08/23/12  Intake/Output from previous day: 03/11 0701 - 03/12 0700 In: 5202.1 [I.V.:5202.1] Out: 1600 [Urine:1550; Blood:50] Intake/Output this shift:    Alert, nad cta Reg Soft, obese. Incisions c/d/i. Mild TTP No edema  Lab Results:   Recent Labs  08/24/12 1200 08/25/12 0410  WBC  --  14.2*  HGB 11.7* 10.8*  HCT 35.0* 33.4*  PLT  --  320   BMET  Recent Labs  08/25/12 0410  NA 139  K 4.0  CL 103  CO2 28  GLUCOSE 119*  BUN 6  CREATININE 0.63  CALCIUM 9.0   PT/INR No results found for this basename: LABPROT, INR,  in the last 72 hours ABG No results found for this basename: PHART, PCO2, PO2, HCO3,  in the last 72 hours  Studies/Results: No results found.  Anti-infectives: Anti-infectives   Start     Dose/Rate Route Frequency Ordered Stop   08/24/12 0515  cefOXitin (MEFOXIN) 2 g in dextrose 5 % 50 mL IVPB     2 g 100 mL/hr over 30 Minutes Intravenous On call to O.R. 08/24/12 0515 08/24/12 0735      Assessment/Plan: s/p Procedure(s) with comments: LAPAROSCOPIC ROUX-EN-Y GASTRIC (N/A) - laparoscopic roux-en-y gastric bypass UPPER GI ENDOSCOPY  For UGI this am. If ok will start ice and water Slight drop in Hgb. Will continue with lovenox. If Hgb drops significantly this pm, will hold lovenox pulm toilet  [name] [name]. [name] Campanile, MD, FACS General, Bariatric, & Minimally Invasive Surgery [facility], [place]   LOS: 1 day    [name] [name] 08/25/2012

## 2012-08-26 LAB — CBC WITH DIFFERENTIAL/PLATELET
Basophils Absolute: 0 10*3/uL (ref 0.0–0.1)
Basophils Relative: 0 % (ref 0–1)
Eosinophils Absolute: 0 10*3/uL (ref 0.0–0.7)
Eosinophils Relative: 0 % (ref 0–5)
HCT: 32.4 % — ABNORMAL LOW (ref 36.0–46.0)
Lymphocytes Relative: 16 % (ref 12–46)
MCH: 25.9 pg — ABNORMAL LOW (ref 26.0–34.0)
MCHC: 31.2 g/dL (ref 30.0–36.0)
MCV: 83.1 fL (ref 78.0–100.0)
Monocytes Absolute: 0.7 10*3/uL (ref 0.1–1.0)
Platelets: 294 10*3/uL (ref 150–400)
RDW: 14.7 % (ref 11.5–15.5)
WBC: 11.7 10*3/uL — ABNORMAL HIGH (ref 4.0–10.5)

## 2012-08-26 MED ORDER — OXYCODONE-ACETAMINOPHEN 5-325 MG/5ML PO SOLN: 5.0000 mL | ORAL | Status: DC | PRN

## 2012-08-26 MED ORDER — VENLAFAXINE HCL 37.5 MG PO TABS: 37.5000 mg | ORAL_TABLET | Freq: Two times a day (BID) | ORAL | Status: DC

## 2012-08-26 MED ORDER — PANTOPRAZOLE SODIUM 40 MG PO TBEC: 40.0000 mg | DELAYED_RELEASE_TABLET | Freq: Every day | ORAL | Status: DC

## 2012-08-26 NOTE — Discharge Summary (Signed)
Physician Discharge Summary  [name] [name] WUJ:[id number] DOB: 1986-06-06 DOA: 08/24/2012  PCP: Rene Paci, MD  Admit date: 08/24/2012 Discharge date: 08/26/2012  Recommendations for Outpatient Follow-up:  1.   Follow-up Information   Follow up with Atilano Ina, MD,FACS On 09/16/2012. (9:30 AM)    Contact information:   [address] [place] [address] [phone]      Discharge Diagnoses:  Patient Active Problem List  Diagnosis  . DEPRESSION  . Elevated blood pressure  . Morbid obesity with BMI of 50.0-59.9, adult    Surgical Procedure: LAPAROSCOPIC ROUX EN Y GASTRIC BYPASS, UPPER ENDOSCOPY  Discharge Condition: GOOD Disposition: TO HOME  Diet recommendation:  POSTOP GASTRIC BYPASS DIET  Filed Weights   08/24/12 0529 08/24/12 1230  Weight: 281 lb 6 oz (127.631 kg) 281 lb 6 oz (127.631 kg)   Hospital Course:  The patient was admitted for planned LRYGB on 08/24/12 which went uneventful. She was maintained on perioperative VTE prophylaxis. On POD 1 she underwent a UGI which demonstrated no leak of contrast so she was started on water and ice chips. On POD 2 she was advanced to protein shakes which she tolerated. She was not febrile or tachycardiac. She was ambulating well. Her pain was well controlled. She was deemed stable for discharge. She was educated regarding discharge instructions.   BP 123/62  Pulse 81  Temp(Src) 98.3 F (36.8 C) (Oral)  Resp 16  Ht 5\' 3"  (1.6 m)  Wt 281 lb 6 oz (127.631 kg)  BMI 49.86 kg/m2  SpO2 96%  LMP 07/31/2012  Breastfeeding? No  Gen: alert, NAD, non-toxic appearing Pupils: equal, no scleral icterus Pulm: Lungs clear to auscultation, symmetric chest rise CV: regular rate and rhythm Abd: soft, mild expected tenderness, nondistended. Incisions c/d/i. No cellulitis. No incisional hernia Ext: no edema, no calf tenderness Skin: no rash, no jaundice   Discharge Instructions  Discharge Orders   Future  Appointments Provider Department Dept Phone   09/07/2012 4:00 PM [facility] Post-Op Class [facility] [phone]   09/16/2012 9:30 AM Atilano Ina, MD [facility], [place] [phone]   Future Orders Complete By Expires     Increase activity slowly  As directed         Medication List    STOP taking these medications       diphenhydrAMINE 25 MG tablet  Commonly known as:  SOMINEX     peg 3350 powder 100 G Solr  Commonly known as:  MOVIPREP     promethazine-phenylephrine 6.25-5 MG/5ML Syrp  Commonly known as:  promethazine-phenylephrine     venlafaxine XR 75 MG 24 hr capsule  Commonly known as:  EFFEXOR-XR     zolpidem 10 MG tablet  Commonly known as:  AMBIEN      TAKE these medications       oxyCODONE-acetaminophen 5-325 MG/5ML solution  Commonly known as:  ROXICET  Take 5-10 mLs by mouth every 4 (four) hours as needed.     pantoprazole 40 MG tablet  Commonly known as:  PROTONIX  Take 1 tablet (40 mg total) by mouth daily.     venlafaxine 37.5 MG tablet  Commonly known as:  EFFEXOR  Take 1 tablet (37.5 mg total) by mouth 2 (two) times daily.           Follow-up Information   Follow up with Atilano Ina, MD,FACS On 09/16/2012. (9:30 AM)    Contact information:   [address] [place] [address] [phone]        The results of significant diagnostics from this hospitalization (including imaging, microbiology, ancillary and laboratory) are listed below for reference.    Significant Diagnostic Studies: Dg Ugi W/water Sol Cm  08/25/2012  *RADIOLOGY REPORT*  Clinical Data:  Postop gastric bypass  UPPER GI SERIES WITH KUB  Technique:  Routine upper GI series was performed with water soluble contrast  Fluoroscopy Time: 35 seconds  Comparison:  06/28/2012  Findings: Initial scout radiograph demonstrates postsurgical changes in the left upper abdomen.  Widely patent gastrojejunostomy.  No leak is seen.   The afferent limb and proximal efferent limb opacify and appear unremarkable.  IMPRESSION: Patent gastrojejunostomy.  No leak is seen.   Original Report Authenticated By: Charline Bills, M.D.     Microbiology: No results found for this or any previous visit (from the past 240 hour(s)).   Labs: Basic Metabolic Panel:  Recent Labs Lab 08/25/12 0410  NA 139  K 4.0  CL 103  CO2 28  GLUCOSE 119*  BUN 6  CREATININE 0.63  CALCIUM 9.0   Liver Function Tests:  Recent Labs Lab 08/25/12 0410  AST 20  ALT 20  ALKPHOS 129*  BILITOT 0.2*  PROT 6.6  ALBUMIN 3.3*  CBC:  Recent Labs Lab 08/24/12 1200 08/25/12 0410 08/25/12 1536 08/25/12 2355  WBC  --  14.2*  --  11.7*  NEUTROABS  --  11.7*  --  9.1*  HGB 11.7* 10.8* 10.4* 10.1*  HCT 35.0* 33.4* 32.2* 32.4*  MCV  --  82.3  --  83.1  PLT  --  320  --  294    Active Problems:   DEPRESSION   Elevated blood pressure   Morbid obesity with BMI of 50.0-59.9, adult   Time coordinating discharge: 10 minutes  Signed:  Atilano Ina, MD [facility], [place] [phone] 08/26/2012, 11:34 AM

## 2012-08-26 NOTE — Progress Notes (Signed)
Patient is alert and oriented, doing well.  Vss. Patient has continued to ambulate in hallway, uses incentive spirometry, and wears compression hose while in bed.  Patient denies nausea, vomiting, passing gas or bowel movement.  Patient is tolerating ice chips and water, diet progressed to protein shakes.  Patient has minimal abdominal discomfort that is relieved with prn medication.  Patient has follow up appointments with [facility] and [facility].  Patient is aware of hospital support group and BELT program.  Discharge instructions listed below reviewed with patient and spouse, patient verbalized understanding.  Misha Mathis, Rn  GASTRIC BYPASS / SLEEVE  Home Care Instructions  These instructions are to help you care for yourself when you go home.  Call: If you have any problems.   Call [phone] and ask for the surgeon on call   If you need immediate assistance come to the ER at [facility]. Tell the ER staff that you are a new post-op gastric bypass or gastric sleeve patient   Signs and symptoms to report:   Severe vomiting or nausea o If you cannot handle clear liquids for longer than 1 day, call your surgeon    Abdominal pain which does not get better after taking your pain medication   Fever greater than 100.4 F and chills   Heart rate over 100 beats a minute   Trouble breathing   Chest pain    Redness, swelling, drainage, or foul odor at incision (surgical) sites    If your incisions open or pull apart   Swelling or pain in calf (lower leg)   Diarrhea (Loose bowel movements that happen often), frequent watery, uncontrolled bowel movements   Constipation, (no bowel movements for 3 days) if this happens:  o Take Milk of Magnesia, 2 tablespoons by mouth, 3 times a day for 2 days if needed o Stop taking Milk of Magnesia once you have had a bowel movement o Call your doctor if constipation continues Or o Take Miralax  (instead of Milk of Magnesia) following the label instructions o Stop  taking Miralax once you have had a bowel movement o Call your doctor if constipation continues   Anything you think is "abnormal for you"   Normal side effects after surgery:   Unable to sleep at night or unable to concentrate   Irritability   Being tearful (crying) or depressed These are common complaints, possibly related to your anesthesia, stress of surgery and change in lifestyle, that usually go away a few weeks after surgery.  If these feelings continue, call your medical doctor.  Wound Care: You may have surgical glue, steri-strips, or staples over your incisions after surgery   Surgical glue:  Looks like a clear film over your incisions and will wear off a little at a time   Steri-strips : Adhesive strips of tape over your incisions. You may notice a yellowish color on the skin under the steri-strips. This is used to make the   steri-strips stick better. Do not pull the steri-strips off - let them fall off   Staples: Staples may be removed before you leave the hospital o If you go home with staples, call [facility] at for an appointment with your surgeon's nurse to have staples removed 10 days after surgery, [phone]   Showering: You may shower two (2) days after your surgery unless your surgeon tells you differently o Wash gently around incisions with warm soapy water, rinse well, and gently pat dry  o If  you have a drain (tube from your incision), you may need someone to hold this while you shower  o No tub baths until staples are removed and incisions are healed     Medications:   Medications should be liquid or crushed if larger than the size of a dime   Extended release pills (medication that releases a little bit at a time through the day) should not be crushed   Depending on the size and number of medications you take, you may need to space (take a few throughout the day)/change the time you take your medications so that you do not over-fill your pouch (smaller  stomach)   Make sure you follow-up with your primary care physician to make medication changes needed during rapid weight loss and life-style changes   If you have diabetes, follow up with the doctor that orders your diabetes medication(s) within one week after surgery and check your blood sugar regularly.   Do not drive while taking narcotics (pain medications)   Do not take acetaminophen (Tylenol) and Roxicet or Lortab Elixir at the same time since these pain medications contain acetaminophen  Diet:                    First 2 Weeks  You will see the nutritionist about two (2) weeks after your surgery. The nutritionist will increase the types of foods you can eat if you are handling liquids well:   If you have severe vomiting or nausea and cannot handle clear liquids lasting longer than 1 day, call your surgeon  Protein Shake   Drink at least 2 ounces of shake 5-6 times per day   Each serving of protein shakes (usually 8 - 12 ounces) should have a minimum of:  o 15 grams of protein  o And no more than 5 grams of carbohydrate    Goal for protein each day: o Men = 80 grams per day o Women = 60 grams per day   Protein powder may be added to fluids such as non-fat milk or Lactaid milk or Soy milk (limit to 35 grams added protein powder per serving)  Hydration   Slowly increase the amount of water and other clear liquids as tolerated (See Acceptable Fluids)   Slowly increase the amount of protein shake as tolerated     Sip fluids slowly and throughout the day   May use sugar substitutes in small amounts (no more than 6 - 8 packets per day; i.e. Splenda)  Fluid Goal   The first goal is to drink at least 8 ounces of protein shake/drink per day (or as directed by the nutritionist); some examples of protein shakes are [employer], Dillard's, EAS Edge HP, and Unjury. See handout from pre-op Bariatric Education Class: o Slowly increase the amount of protein shake you drink as  tolerated o You may find it easier to slowly sip shakes throughout the day o It is important to get your proteins in first   Your fluid goal is to drink 64 - 100 ounces of fluid daily o It may take a few weeks to build up to this   32 oz (or more) should be clear liquids  And    32 oz (or more) should be full liquids (see below for examples)   Liquids should not contain sugar, caffeine, or carbonation  Clear Liquids:   Water or Sugar-free flavored water (i.e. Fruit H2O, Propel)   Decaffeinated coffee or tea (sugar-free)  Crystal Lite, [employer]MC[employer], Minute Maid Lite   Sugar-free Jell-O   Bouillon or broth   Sugar-free Popsicle:   *Less than 20 calories each; Limit 1 per day  Full Liquids: Protein Shakes/Drinks + 2 choices per day of other full liquids   Full liquids must be: o No More Than 12 grams of Carbs per serving  o No More Than 3 grams of Fat per serving   Strained low-fat cream soup   Non-Fat milk   Fat-free Lactaid Milk   Sugar-free yogurt (Dannon Lite & Fit, Greek yogurt)      Vitamins and Minerals   Start 1 day after surgery unless otherwise directed by your surgeon   2 Chewable Multivitamin / Multimineral Supplement with iron (i.e. Centrum for Adults)   Vitamin B-12, 350 - 500 micrograms sub-lingual (place tablet under the tongue) each day   Chewable Calcium Citrate with Vitamin D-3 (Example: 3 Chewable Calcium Plus 600 with Vitamin D-3) o Take 500 mg three (3) times a day for a total of 1500 mg each day o Do not take all 3 doses of calcium at one time as it may cause constipation, and you can only absorb 500 mg  at a time  o Do not mix multivitamins containing iron with calcium supplements; take 2 hours apart o Do not substitute Tums (calcium carbonate) for your calcium   Menstruating women and those at risk for anemia (a blood disease that causes weakness) may need extra iron o Talk with your doctor to see if you need more iron   If you need extra iron: Total  daily Iron recommendation (including Vitamins) is 50 to 100 mg Iron/day   Do not stop taking or change any vitamins or minerals until you talk to your nutritionist or surgeon   Your nutritionist and/or surgeon must approve all vitamin and mineral supplements   Activity and Exercise: It is important to continue walking at home.  Limit your physical activity as instructed by your doctor.  During this time, use these guidelines:   Do not lift anything greater than ten (10) pounds for at least two (2) weeks   Do not go back to work or drive until Designer, industrial/product says you can   You may have sex when you feel comfortable  o It is VERY important for female patients to use a reliable birth control method; fertility often increases after surgery  o Do not get pregnant for at least 18 months   Start exercising as soon as your doctor tells you that you can o Make sure your doctor approves any physical activity   Start with a simple walking program   Walk 5-15 minutes each day, 7 days per week.    Slowly increase until you are walking 30-45 minutes per day Consider joining our BELT program. [phone] or email [email or web address]   Special Instructions Things to remember:   Free counseling is available for you and your family through collaboration between [facility] and [place]. Please call [phone] and leave a message   Use your CPAP when sleeping if this applies to you    Consider buying a medical alert bracelet that says you had lap-band surgery    You will likely have your first fill (fluid added to your band) 6 - 8 weeks after surgery   [facility] has a free Bariatric Surgery Support Group that meets monthly, the 3rd Thursday, 6 pm, [facility] You can see classes online  at [email or web address]   It is very important to keep all follow up appointments with your surgeon, nutritionist, primary care physician, and behavioral health practitioner o After the  first year, please follow up with your bariatric surgeon and nutritionist at least once a year in order to maintain best weight loss results [facility]: [phone] [facility]: [phone] Bariatric Nurse Coordinator: [phone]   Reviewed and Endorsed  by [facility] Patient Education Committee, Jan, 2014

## 2012-09-07 ENCOUNTER — Encounter: Payer: Self-pay | Admitting: *Deleted

## 2012-09-07 ENCOUNTER — Encounter: Payer: 59 | Attending: General Surgery | Admitting: *Deleted

## 2012-09-07 VITALS — Ht 63.0 in | Wt 265.0 lb

## 2012-09-07 DIAGNOSIS — Z01818 Encounter for other preprocedural examination: Secondary | ICD-10-CM | POA: Insufficient documentation

## 2012-09-07 DIAGNOSIS — Z713 Dietary counseling and surveillance: Secondary | ICD-10-CM | POA: Insufficient documentation

## 2012-09-07 NOTE — Patient Instructions (Signed)
Patient to follow Phase 3A-Soft, High Protein Diet and follow-up at [facility] in 6 weeks for 2 months post-op nutrition visit for diet advancement. 

## 2012-09-07 NOTE — Progress Notes (Signed)
Bariatric Class:  Appt start time: 1600 end time:  1700.  2 Week Post-Operative Nutrition Class  Patient was seen on 09/07/2012 for Post-Operative Nutrition [facility].   Surgery date: 08/24/12  Surgery type: RYGB  Start weight at [facility]: 285.3 lbs (06/29/12)  Pre-Op Class weight: 286.5 lbs   Weight change: 21.5 lbs Total weight lost: 21.5 lbs (08/05/12)  TANITA  BODY COMP RESULTS  09/07/12   BMI (kg/m^2) 46.9   Fat Mass (lbs) 147.0   Fat Free Mass (lbs) 118.0   Total Body Water (lbs) 86.5   The following the learning objectives were met by the patient during this course:  Identifies Phase 3A (Soft, High Proteins) Dietary Goals and will begin from 2 weeks post-operatively to 2 months post-operatively  Identifies appropriate sources of fluids and proteins   States protein recommendations and appropriate sources post-operatively  Identifies the need for appropriate texture modifications, mastication, and bite sizes when consuming solids  Identifies appropriate multivitamin and calcium sources post-operatively  Describes the need for physical activity post-operatively and will follow MD recommendations  States when to call healthcare provider regarding medication questions or post-operative complications  Handouts given during class include:  Phase 3A: Soft, High Protein Diet Handout  Follow-Up Plan: Patient will follow-up at [facility] in 6 weeks for 2 months post-op nutrition visit for diet advancement per MD.

## 2012-09-16 ENCOUNTER — Ambulatory Visit (INDEPENDENT_AMBULATORY_CARE_PROVIDER_SITE_OTHER): Payer: Commercial Managed Care - PPO | Admitting: General Surgery

## 2012-09-16 ENCOUNTER — Encounter (INDEPENDENT_AMBULATORY_CARE_PROVIDER_SITE_OTHER): Payer: Self-pay | Admitting: General Surgery

## 2012-09-16 VITALS — BP 132/80 | HR 72 | Temp 97.2°F | Resp 16 | Ht 63.0 in | Wt 259.8 lb

## 2012-09-16 DIAGNOSIS — Z09 Encounter for follow-up examination after completed treatment for conditions other than malignant neoplasm: Secondary | ICD-10-CM

## 2012-09-16 NOTE — Patient Instructions (Signed)
Try incorporating intervals into your walks Start taking Miralax daily Increase your fluid intake as discussed

## 2012-09-16 NOTE — Progress Notes (Signed)
Subjective:     Patient ID: [name] [name], female   DOB: 15-Oct-1985, 27 y.o.   MRN: [id number]  HPI 27 year old Caucasian female comes in today for her first postoperative appointment after undergoing laparoscopic Roux-en-Y gastric bypass surgery on March 11. She was discharged on March 13. She has already seen the nutritionist and been advance to solid proteins. She denies any abdominal pain. She denies any fever, chills, reflux. She is taking her multivitamin and supplements. She has had one or 2 episodes of regurgitation. This occurred with it. She has also had irregular bowel movements. She has been having a bowel movement about every 2-3 days.  Review of Systems     Objective:   Physical Exam BP 132/80  Pulse 72  Temp(Src) 97.2 F (36.2 C) (Temporal)  Resp 16  Ht 5\' 3"  (1.6 m)  Wt 259 lb 12.8 oz (117.845 kg)  BMI 46.03 kg/m2  Gen: alert, NAD, non-toxic appearing Pupils: equal, no scleral icterus Pulm: Lungs clear to auscultation, symmetric chest rise CV: regular rate and rhythm Abd: soft, nontender, nondistended. Well-healed trocar sites. No cellulitis. No incisional hernia Ext: no edema, no calf tenderness Skin: no rash, no jaundice     Assessment:     Status post laparoscopic Roux-en-Y gastric bypass     Plan:     Overall, I think she is doing quite well. Her preoperative weight was 286.5 pounds. Her weight today is 259 pounds. I explained to her that her energy level will continue to improve. I advised her that she needs to work on her fluid intake. Right now she is drinking the majority of her fluids in the evening; I told her she needs to try to drink more fluids earlier in the day. She was encouraged to start walking on a more regular basis. We discussed interval walking. With respect to her constipation I asked her to start taking MiraLAX on a daily basis. Followup 2 months  [name] Andrey Campanile, MD, FACS General, Bariatric, & Minimally Invasive Surgery [facility], [place]

## 2012-10-19 ENCOUNTER — Encounter: Payer: 59 | Attending: General Surgery | Admitting: *Deleted

## 2012-10-19 ENCOUNTER — Encounter: Payer: Self-pay | Admitting: *Deleted

## 2012-10-19 VITALS — Ht 63.0 in | Wt 247.0 lb

## 2012-10-19 DIAGNOSIS — Z713 Dietary counseling and surveillance: Secondary | ICD-10-CM | POA: Insufficient documentation

## 2012-10-19 DIAGNOSIS — Z01818 Encounter for other preprocedural examination: Secondary | ICD-10-CM | POA: Insufficient documentation

## 2012-10-19 DIAGNOSIS — Z6841 Body Mass Index (BMI) 40.0 and over, adult: Secondary | ICD-10-CM

## 2012-10-19 NOTE — Patient Instructions (Addendum)
Goals:  Follow Phase 3B: High Protein + Non-Starchy Vegetables  Eat 3-6 small meals/snacks, every 3-5 hrs  Increase lean protein foods to meet 60-80g goal  Increase fluid intake to 64oz +  Avoid drinking 15 minutes before, during and 30 minutes after eating  Aim for >30 min of physical activity daily 

## 2012-10-19 NOTE — Progress Notes (Signed)
Follow-up visit:  8 Weeks Post-Operative RYGB Surgery  Medical Nutrition Therapy:  Appt start time: 1115  End time:  1145.  Primary concerns today: Post-operative Bariatric Surgery Nutrition Management. [name] [name] returns for f/u. Reports she cannot tolerate chicken, tuna, eggs, or shrimp.   Surgery date: 08/24/12  Surgery type: RYGB  Start weight at [facility]: 285.3 lbs (06/29/12)  Pre-Op Class weight: 286.5 lbs   Weight today: 247.0 lbs Weight change: 18.0 lbs Total weight lost: 39.5 lbs (08/05/12)  Goat weight: 150 lbs % weight met: 29%  TANITA  BODY COMP RESULTS  09/07/12 10/19/12   BMI (kg/m^2) 46.9 43.8   Fat Mass (lbs) 147.0 132.0   Fat Free Mass (lbs) 118.0 115.0   Total Body Water (lbs) 86.5 84.0   24-hr recall: B (AM): Yogurt greek (12g) OR 1/2 Atkins protein shake (when at BELT) Snk (AM): other 1/2 shake OR cheese cubes  L (PM): Hummus w/ zuccini OR [place] and cheese Snk (PM): NONE  D (PM): 3 oz tilapia, green beans, 1 tsp mashed potatoes Snk (PM): SF popsicle  Fluid intake: 11 oz protein shake, water = 60-80 oz Estimated total protein intake: 60 oz  Medications:  See medication list Supplementation: Taking regularly  Using straws: Yes; at restaurants only - no gas reported Drinking while eating: No Hair loss: Yes; mild Carbonated beverages: No N/V/D/C:  Cannot tolerate chicken, tuna, eggs, or shrimp; usually regurgitates Dumping syndrome: None  Recent physical activity:  Participates in BELT program 3 days/week. Walks with daughters around neighborhood.  Progress Towards Goal(s):  In progress.  Handouts given during visit include:  Phase 3B: High Protein + Non-Starchy Vegetables  Samples given during visit include:  Celebrate Iron + C 18 mg: 5 ea Lot # [id number]; Exp: 09/15  Celebrate Iron + C 30 mg: 5 ea Lot # [id number]; Exp: 03/15    Nutritional Diagnosis:  Flasher-3.3 Overweight/obesity related to past poor dietary habits and physical inactivity as evidenced by  patient w/ recent RYGB surgery following dietary guidelines for continued weight loss.    Intervention:  Nutrition education/diet advancement.  Monitoring/Evaluation:  Dietary intake, exercise, lap band fills, and body weight. Follow up in 1 months for 3 month post-op visit.

## 2012-10-22 ENCOUNTER — Telehealth (INDEPENDENT_AMBULATORY_CARE_PROVIDER_SITE_OTHER): Payer: Self-pay | Admitting: *Deleted

## 2012-10-22 NOTE — Telephone Encounter (Signed)
Patient called to ask about taking Mobic which was prescribed by her podiatrist.   Instructed patient that Mobic is an NSAID so she should not take it due to gastric irritation.  Patient asked if Wilson MD can suggest another medication other than Tylenol that she can recommend to her podiatrist to prescribe that would ok.

## 2012-11-16 ENCOUNTER — Encounter: Payer: 59 | Attending: General Surgery | Admitting: *Deleted

## 2012-11-16 ENCOUNTER — Encounter: Payer: Self-pay | Admitting: *Deleted

## 2012-11-16 VITALS — Ht 63.0 in | Wt 235.0 lb

## 2012-11-16 DIAGNOSIS — Z713 Dietary counseling and surveillance: Secondary | ICD-10-CM | POA: Insufficient documentation

## 2012-11-16 DIAGNOSIS — Z01818 Encounter for other preprocedural examination: Secondary | ICD-10-CM | POA: Insufficient documentation

## 2012-11-16 NOTE — Patient Instructions (Signed)
Goals:  Follow Phase 3B: High Protein + Non-Starchy Vegetables  Increase lean protein foods to meet 60-80g goal  Increase fluid intake to 64oz +  Add 15 grams of carbohydrate (fruit, whole grain) with meals  Avoid drinking 15 minutes before, during and 30 minutes after eating  Aim for >30 min of physical activity daily

## 2012-11-16 NOTE — Progress Notes (Signed)
Follow-up visit:  12 Weeks Post-Operative RYGB Surgery  Medical Nutrition Therapy:  Appt start time: 1215  End time:  1245.  Primary concerns today: Post-operative Bariatric Surgery Nutrition Management. [name] [name] returns for f/u and reports a very heavy menstrual cycle this month. Reports she can now tolerate chicken, tuna, eggs, or shrimp.   Surgery date: 08/24/12  Surgery type: RYGB  Start weight at [facility]: 285.3 lbs (06/29/12)  Pre-Op Class weight: 286.5 lbs (08/05/12)  Weight today: 235.0 lbs Weight change: 12.0 lbs Total weight lost: 51.5 lbs (08/05/12)  Goat weight: 150 lbs % weight met: %  TANITA  BODY COMP RESULTS  09/07/12 10/19/12 11/16/12   BMI (kg/m^2) 46.9 43.8 41.6   Fat Mass (lbs) 147.0 132.0 121.5   Fat Free Mass (lbs) 118.0 115.0 113.5   Total Body Water (lbs) 86.5 84.0 83.0   24-hr recall: B (AM): Yogurt greek (12g) OR 1/2 Atkins protein shake (when at BELT) Snk (AM): other 1/2 shake OR cheese cubes  L (PM): Hummus w/ zuccini OR [place] and cheese Snk (PM): NONE  D (PM): 3 oz tilapia, green beans, 1 tsp mashed potatoes Snk (PM): SF popsicle  Fluid intake: 11 oz protein shake, water = 60-80 oz Estimated total protein intake: 60 g  Medications:  See medication list Supplementation: Taking regularly; Rx faxed for Nascobal. Should be receiving shortly.   Using straws: Yes; at restaurants only - no gas reported Drinking while eating: No Hair loss:  No; resolved Carbonated beverages: No N/V/D/C:  None Dumping syndrome: None  Recent physical activity:  Participates in BELT program 3 days/week. Walks with daughters around neighborhood and uses resistance band at home.   Progress Towards Goal(s):  In progress.   Nutritional Diagnosis:  Cascade-3.3 Overweight/obesity related to past poor dietary habits and physical inactivity as evidenced by patient w/ recent RYGB surgery following dietary guidelines for continued weight loss.    Intervention:  Nutrition education/diet  advancement.  Monitoring/Evaluation:  Dietary intake, exercise, and body weight. Follow up in 3 months for 6 month post-op visit.

## 2012-11-18 ENCOUNTER — Encounter (INDEPENDENT_AMBULATORY_CARE_PROVIDER_SITE_OTHER): Payer: Self-pay | Admitting: General Surgery

## 2012-11-18 ENCOUNTER — Ambulatory Visit (INDEPENDENT_AMBULATORY_CARE_PROVIDER_SITE_OTHER): Payer: Commercial Managed Care - PPO | Admitting: General Surgery

## 2012-11-18 VITALS — BP 112/78 | HR 94 | Temp 97.4°F | Ht 63.0 in | Wt 232.8 lb

## 2012-11-18 DIAGNOSIS — Z9884 Bariatric surgery status: Secondary | ICD-10-CM

## 2012-11-18 NOTE — Patient Instructions (Signed)
Keep up the great work Keep up with the BELT/HOPE program Use your pedometer on off days and set weekly goals to increase your daily step count Discuss the heavy periods with your GYN

## 2012-11-18 NOTE — Progress Notes (Addendum)
Subjective:     Patient ID: [name] [name], female   DOB: 1986-04-04, 27 y.o.   MRN: [id number]  HPI 27 year old female comes in for long-term followup after undergoing laparoscopic Roux-en-Y gastric bypass surgery on March 11. I last saw her in the office on April 3. Since that time she states that she is doing well. She reports increased energy level. She is doing the BELT program and states that she loves it. She denies any fever, chills, nausea, vomiting, diarrhea or constipation. She denies any abdominal pain. She denies any paresthesias. She has had some intermittent problems with skin fold irritation. She also reports some intermittent foot pain. She has seen a podiatrist. She has flat feet. She has been fitted for orthotics. She reports that she is taking her multivitamin supplements.  Her only complaint is increased bleeding and duration of her periods. She denies any lightheadedness or dizziness  PMHx, PSHx, SOCHx, FAMHx, ALL reviewed  Review of Systems 10 point review of systems was performed and all systems are negative except for what is mentioned in the history of present illness    Objective:   Physical Exam BP 112/78  Pulse 94  Temp(Src) 97.4 F (36.3 C)  Ht 5\' 3"  (1.6 m)  Wt 232 lb 12.8 oz (105.597 kg)  BMI 41.25 kg/m2  SpO2 97%  Gen: alert, NAD, non-toxic appearing Pupils: equal, no scleral icterus Pulm: Lungs clear to auscultation, symmetric chest rise CV: regular rate and rhythm Abd: soft, nontender, nondistended. Well-healed trocar sites. No cellulitis. No incisional hernia Ext: no edema, no calf tenderness Skin: no rash, no jaundice     Assessment:     Status post laparoscopic Roux-en-Y gastric bypass     Plan:     Overall she is doing great. Her total weight loss today is around 53.5 pounds. Her weight at her last visit was 259.8 pounds. Her weight today is 232.8 pounds. I congratulated her on her weight loss. I encouraged her to continue with the  belt program. She is interested in transitioning to the [facility] program when she completes the belt program. We discussed the importance of ongoing exercise and tracking her steps with her pedometer. I told her she could take meloxicam on intermittent sparing basis. With respect to her heavy periods I encouraged her to discuss this with her OB/GYN.  Followup three-month  [name] Andrey Campanile, MD, FACS General, Bariatric, & Minimally Invasive Surgery [facility], [place]

## 2012-12-14 LAB — HM PAP SMEAR

## 2013-01-03 ENCOUNTER — Other Ambulatory Visit: Payer: Self-pay | Admitting: Internal Medicine

## 2013-01-03 NOTE — Telephone Encounter (Signed)
Faxed script back to [facility].../lmb 

## 2013-01-17 ENCOUNTER — Encounter (INDEPENDENT_AMBULATORY_CARE_PROVIDER_SITE_OTHER): Payer: Self-pay | Admitting: General Surgery

## 2013-02-16 ENCOUNTER — Ambulatory Visit: Payer: 59 | Admitting: Dietician

## 2013-02-22 ENCOUNTER — Encounter: Payer: 59 | Attending: General Surgery | Admitting: Dietician

## 2013-02-22 DIAGNOSIS — Z01818 Encounter for other preprocedural examination: Secondary | ICD-10-CM | POA: Insufficient documentation

## 2013-02-22 DIAGNOSIS — Z713 Dietary counseling and surveillance: Secondary | ICD-10-CM | POA: Insufficient documentation

## 2013-02-22 NOTE — Progress Notes (Addendum)
Follow-up visit:  6 Months Post-Operative RYGB Surgery  Medical Nutrition Therapy:  Appt start time: 1215  End time:  1245.  Primary concerns today: Post-operative Bariatric Surgery Nutrition Management. [name] [name] returns for f/u today. Lost 25.5 pounds of fat since last time. Has been working a lot at nights so have some soda (1 every two days) for caffeine. Reports not working out since since it's been hot and hasn't made it to the [place] program since she took a part time job. Does walk a lot for work and plays with kids actively at home.   Reports she has trouble tolerating grilled chicken sometimes and has a lot of fish and peanut butter. MVIs are "too sweet" so she is having some MVI pills to swallow instead.   Surgery date: 08/24/12  Surgery type: RYGB  Start weight at [facility]: 285.3 lbs (06/29/12)  Pre-Op Class weight: 286.5 lbs (08/05/12)  Weight today: 212 lbs Weight change: 23 lbs Total weight lost: 74.5 lbs (08/05/12)  Goat weight: 150 lbs % weight met: 55%  TANITA  BODY COMP RESULTS  09/07/12 10/19/12 11/16/12 02/22/13   BMI (kg/m^2) 46.9 43.8 41.6 36.4   Fat Mass (lbs) 147.0 132.0 121.5 96.0   Fat Free Mass (lbs) 118.0 115.0 113.5 116.0   Total Body Water (lbs) 86.5 84.0 83.0 85.0   24-hr recall: B (AM): Yogurt greek (12g) OR 1/2 Atkins protein shake  Snk (AM): other 1/2 shake OR cheese cubes  L (PM):  [place] and cheese OR canned tuna Snk (PM): 1% milk  D (PM): 3 oz salmon, green beans or other vegetables and beans Snk (PM): SF popsicle  Fluid intake: 11 oz protein shake, water = 60-80 oz, will get water in when at home or at the satellite at the hospital and more difficult to get in when working Estimated total protein intake: 60 g  Medications:  See medication list Supplementation: Taking regularly; started taking MVI pills instead of chewables  Using straws: Yes; at work - no gas reported Drinking while eating: No Hair loss:  Yes Carbonated beverages: Yes - about 3 per  week  N/V/D/C:  Chicken or eating too fast will cause vomiting, has had some constipation this week (likely d/t lack of water) Dumping syndrome: None, maybe one time   Recent physical activity:  Unable to participate recently in [facility] program, but walks with daughters around neighborhood, walks at work, and uses resistance band at home. Getting between 4000-7500 steps in per week.   Supplements given: 2 Packets of PB 2: exp - 06/28/2013, lot # - none given  Progress Towards Goal(s):  In progress.   Nutritional Diagnosis:  [place]-3.3 Overweight/obesity related to past poor dietary habits and physical inactivity as evidenced by patient w/ recent RYGB surgery following dietary guidelines for continued weight loss.    Intervention:  Nutrition education/diet advancement.  Suggested trying unsweet tea instead of soda for caffeine while at work.   Monitoring/Evaluation:  Dietary intake, exercise, and body weight. Follow up in 3 months for 6 month post-op visit.

## 2013-02-22 NOTE — Patient Instructions (Addendum)
Goals:  Follow Phase 3B: High Protein + Non-Starchy Vegetables  Continue having lean protein foods to meet 60-80g goal  Increase fluid intake to 64oz +  Add 15 grams of carbohydrate (fruit, whole grain) with meals  Avoid drinking 15 minutes before, during and 30 minutes after eating  Aim for >30 min of physical activity daily

## 2013-03-21 ENCOUNTER — Other Ambulatory Visit: Payer: Self-pay | Admitting: *Deleted

## 2013-03-21 ENCOUNTER — Other Ambulatory Visit (INDEPENDENT_AMBULATORY_CARE_PROVIDER_SITE_OTHER): Payer: Self-pay | Admitting: General Surgery

## 2013-03-21 MED ORDER — VENLAFAXINE HCL 37.5 MG PO TABS: 37.5000 mg | ORAL_TABLET | Freq: Two times a day (BID) | ORAL | Status: DC

## 2013-03-21 NOTE — Telephone Encounter (Signed)
Needs to be approved by PMD.  We only prescribed at discharge from inpatient. Dr. Felicity Coyer should be prescribing this medication again for patient.

## 2013-04-01 ENCOUNTER — Other Ambulatory Visit: Payer: Self-pay | Admitting: Internal Medicine

## 2013-04-01 NOTE — Telephone Encounter (Signed)
Zolpidem has been called to pharmacy  

## 2013-04-05 ENCOUNTER — Ambulatory Visit: Admit: 2013-04-05 | Payer: Self-pay | Admitting: Obstetrics and Gynecology

## 2013-04-05 SURGERY — HYSTERECTOMY, TOTAL, LAPAROSCOPIC
Anesthesia: General

## 2013-04-07 ENCOUNTER — Encounter (INDEPENDENT_AMBULATORY_CARE_PROVIDER_SITE_OTHER): Payer: Self-pay | Admitting: General Surgery

## 2013-04-07 ENCOUNTER — Ambulatory Visit (INDEPENDENT_AMBULATORY_CARE_PROVIDER_SITE_OTHER): Payer: Commercial Managed Care - PPO | Admitting: General Surgery

## 2013-04-07 VITALS — BP 128/80 | HR 77 | Temp 97.7°F | Resp 16 | Ht 64.0 in | Wt 202.8 lb

## 2013-04-07 DIAGNOSIS — E669 Obesity, unspecified: Secondary | ICD-10-CM

## 2013-04-07 DIAGNOSIS — Z9884 Bariatric surgery status: Secondary | ICD-10-CM

## 2013-04-07 NOTE — Patient Instructions (Signed)
pls get your labs drawn Continue to exercise regularly Continue to cut down/eliminate sodas

## 2013-04-07 NOTE — Progress Notes (Signed)
Subjective:     Patient ID: [name] [name], female   DOB: April 14, 1986, 27 y.o.   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 I last saw her in the office on June 5. At that time her weight measured 232 pounds. Her preoperative weight was 285 pounds. She states that she has been doing well. She only reports mild intermittent episodes of nausea and regurgitation and they occur very infrequently. She reports some hair thinning and hair loss. She estimates she is getting 60 g of protein on a consistent basis. She has tried Biotin in the past but stopped taking it. She denies any abdominal pain. She denies any diarrhea or constipation. She denies any melena or hematochezia. She denies any reflux. She denies any paresthesias. She does have some itching in her skin folds. She has used antifungal ointment as well as hydrocortisone ointment. She finds the antifungal ointment is better. She does have some intermittent fatigue. She also states that occasionally she will notice some bruising in her lower extremities. She states that she is taking her supplements. She is having one bowel movement a day. She is not taking any laxatives. The foot pain that she complained of at her last appointment has resolved after getting an injection by her podiatrist.  PMHx, PSHx, SOCHx, FAMHx, ALL reviewed   Review of Systems 12 point ROS peformed and negative except for HPI    Objective:   Physical Exam BP 128/80  Pulse 77  Temp(Src) 97.7 F (36.5 C) (Temporal)  Resp 16  Ht 5\' 4"  (1.626 m)  Wt 202 lb 12.8 oz (91.989 kg)  BMI 34.79 kg/m2  Gen: alert, NAD, non-toxic appearing Pupils: equal, no scleral icterus Pulm: Lungs clear to auscultation, symmetric chest rise CV: regular rate and rhythm Abd: soft, nontender, nondistended. Well-healed trocar sites. No cellulitis. No incisional hernia. Redundant skin folds Ext: no  edema, no calf tenderness Skin: no rash, no jaundice     Assessment:     S/p LRYGB Obesity Elevated BP     Plan:     Overall I think she is doing really well. Total weight loss is around 82.2 pounds. Her new BMI is 34.58 down from 50.5. I congratulated her on her weight loss. I encouraged her to increase her exercise. Right now she is only walking 3 times a week for about 1 mile. With respect to the itching in her skin folds I recommended using [place] medicated powder. I've asked her to stop using the hydrocortisone ointment. With respect to the bruising in her legs she does have a history of anemia. She is due for lab surveillance. We'll check her blood count and her iron panel as part of her lab surveillance. Followup in 3 months  [name] Andrey Campanile, MD, FACS General, Bariatric, & Minimally Invasive Surgery [facility], [place]

## 2013-04-14 ENCOUNTER — Other Ambulatory Visit (INDEPENDENT_AMBULATORY_CARE_PROVIDER_SITE_OTHER): Payer: Self-pay | Admitting: General Surgery

## 2013-04-14 LAB — CBC WITH DIFFERENTIAL/PLATELET
Basophils Absolute: 0 10*3/uL (ref 0.0–0.1)
Basophils Relative: 0 % (ref 0–1)
Eosinophils Relative: 1 % (ref 0–5)
HCT: 34.3 % — ABNORMAL LOW (ref 36.0–46.0)
MCHC: 33.2 g/dL (ref 30.0–36.0)
MCV: 79.6 fL (ref 78.0–100.0)
Monocytes Absolute: 0.5 10*3/uL (ref 0.1–1.0)
Platelets: 288 10*3/uL (ref 150–400)
RDW: 14.5 % (ref 11.5–15.5)

## 2013-04-14 LAB — CMP AND LIVER
ALT: 9 U/L (ref 0–35)
Alkaline Phosphatase: 134 U/L — ABNORMAL HIGH (ref 39–117)
Bilirubin, Direct: 0.1 mg/dL (ref 0.0–0.3)
CO2: 29 mEq/L (ref 19–32)
Creat: 0.56 mg/dL (ref 0.50–1.10)
Sodium: 140 mEq/L (ref 135–145)
Total Bilirubin: 0.3 mg/dL (ref 0.3–1.2)
Total Protein: 6.1 g/dL (ref 6.0–8.3)

## 2013-04-14 LAB — LIPID PANEL
HDL: 49 mg/dL (ref >39)
LDL Cholesterol: 84 mg/dL (ref 0–99)
Total CHOL/HDL Ratio: 3.2 Ratio
Triglycerides: 110 mg/dL (ref <150)

## 2013-04-14 LAB — IRON AND TIBC
%SAT: 11 % — ABNORMAL LOW (ref 20–55)
TIBC: 336 ug/dL (ref 250–470)
UIBC: 300 ug/dL (ref 125–400)

## 2013-04-18 ENCOUNTER — Encounter (INDEPENDENT_AMBULATORY_CARE_PROVIDER_SITE_OTHER): Payer: Self-pay | Admitting: General Surgery

## 2013-04-19 ENCOUNTER — Encounter (INDEPENDENT_AMBULATORY_CARE_PROVIDER_SITE_OTHER): Payer: Self-pay | Admitting: General Surgery

## 2013-04-21 ENCOUNTER — Other Ambulatory Visit: Payer: Self-pay

## 2013-04-28 ENCOUNTER — Encounter (INDEPENDENT_AMBULATORY_CARE_PROVIDER_SITE_OTHER): Payer: Self-pay | Admitting: General Surgery

## 2013-05-07 ENCOUNTER — Encounter (HOSPITAL_COMMUNITY): Payer: Self-pay | Admitting: Emergency Medicine

## 2013-05-07 ENCOUNTER — Emergency Department (HOSPITAL_COMMUNITY)
Admission: EM | Admit: 2013-05-07 | Discharge: 2013-05-07 | Disposition: A | Discharge status: Home / Self Care | Payer: 59 | Attending: Emergency Medicine | Admitting: Emergency Medicine

## 2013-05-07 ENCOUNTER — Emergency Department (HOSPITAL_COMMUNITY): Payer: 59

## 2013-05-07 DIAGNOSIS — F3289 Other specified depressive episodes: Secondary | ICD-10-CM | POA: Insufficient documentation

## 2013-05-07 DIAGNOSIS — F329 Major depressive disorder, single episode, unspecified: Secondary | ICD-10-CM | POA: Insufficient documentation

## 2013-05-07 DIAGNOSIS — D649 Anemia, unspecified: Secondary | ICD-10-CM | POA: Insufficient documentation

## 2013-05-07 DIAGNOSIS — Z8742 Personal history of other diseases of the female genital tract: Secondary | ICD-10-CM | POA: Insufficient documentation

## 2013-05-07 DIAGNOSIS — Z87448 Personal history of other diseases of urinary system: Secondary | ICD-10-CM | POA: Insufficient documentation

## 2013-05-07 DIAGNOSIS — Z8719 Personal history of other diseases of the digestive system: Secondary | ICD-10-CM | POA: Insufficient documentation

## 2013-05-07 DIAGNOSIS — N201 Calculus of ureter: Secondary | ICD-10-CM | POA: Insufficient documentation

## 2013-05-07 DIAGNOSIS — F411 Generalized anxiety disorder: Secondary | ICD-10-CM | POA: Insufficient documentation

## 2013-05-07 LAB — CBC WITH DIFFERENTIAL/PLATELET
Eosinophils Absolute: 0.1 10*3/uL (ref 0.0–0.7)
Hemoglobin: 12.6 g/dL (ref 12.0–15.0)
Lymphocytes Relative: 25 % (ref 12–46)
Lymphs Abs: 2.1 10*3/uL (ref 0.7–4.0)
Neutro Abs: 5.6 10*3/uL (ref 1.7–7.7)
Neutrophils Relative %: 66 % (ref 43–77)
Platelets: 290 10*3/uL (ref 150–400)
RBC: 4.58 MIL/uL (ref 3.87–5.11)
WBC: 8.6 10*3/uL (ref 4.0–10.5)

## 2013-05-07 LAB — BASIC METABOLIC PANEL
CO2: 25 mEq/L (ref 19–32)
Chloride: 101 mEq/L (ref 96–112)
GFR calc non Af Amer: >90 mL/min (ref >90)
Glucose, Bld: 79 mg/dL (ref 70–99)
Potassium: 3.3 mEq/L — ABNORMAL LOW (ref 3.5–5.1)
Sodium: 138 mEq/L (ref 135–145)

## 2013-05-07 LAB — URINE MICROSCOPIC-ADD ON

## 2013-05-07 LAB — URINALYSIS, ROUTINE W REFLEX MICROSCOPIC
Specific Gravity, Urine: 1.03 (ref 1.005–1.030)
Urobilinogen, UA: 1 mg/dL (ref 0.0–1.0)
pH: 5.5 (ref 5.0–8.0)

## 2013-05-07 LAB — POCT PREGNANCY, URINE: Preg Test, Ur: NEGATIVE

## 2013-05-07 IMAGING — CT CT ABD-PELV W/O CM
2 of 4 series · 16 of 46 positions shown, 18 images · non-contrast
Comparison: [DATE]

CLINICAL DATA: Right flank pain

EXAM:
CT ABDOMEN AND PELVIS WITHOUT CONTRAST
TECHNIQUE: Multidetector CT imaging of the abdomen and pelvis was performed
following the standard protocol without intravenous contrast.

[Series 2: stone study 5.0 i30f 1 · axial · 0.72mm/px · z∈[-466,-46]mm · 13 of 92 slices shown, 15 images]
[im 4/92  soft-tissue]
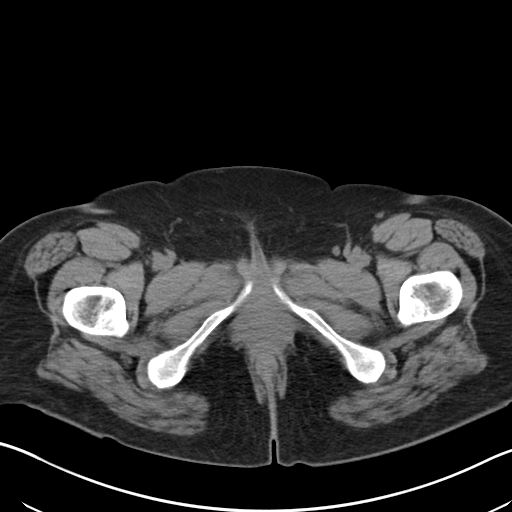
[im 4/92  bone]
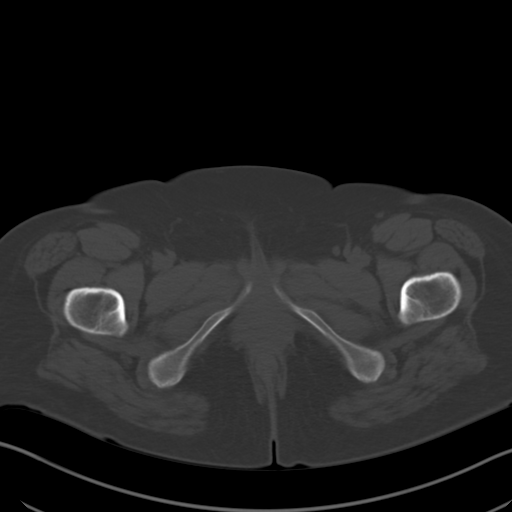
[im 12/92  soft-tissue]
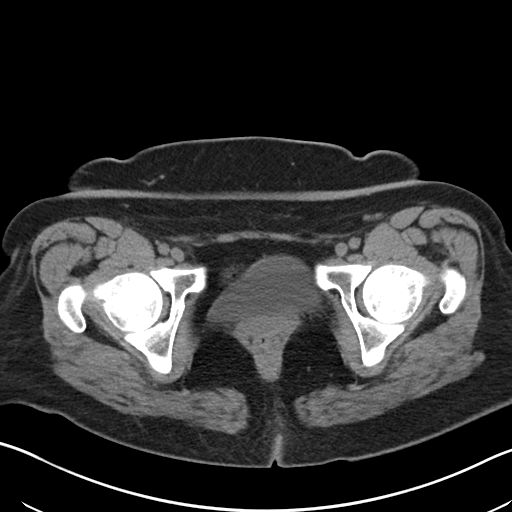
[im 19/92  soft-tissue]
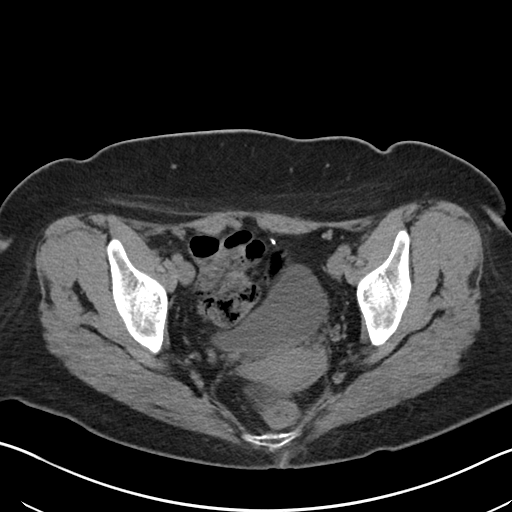
[im 27/92  soft-tissue]
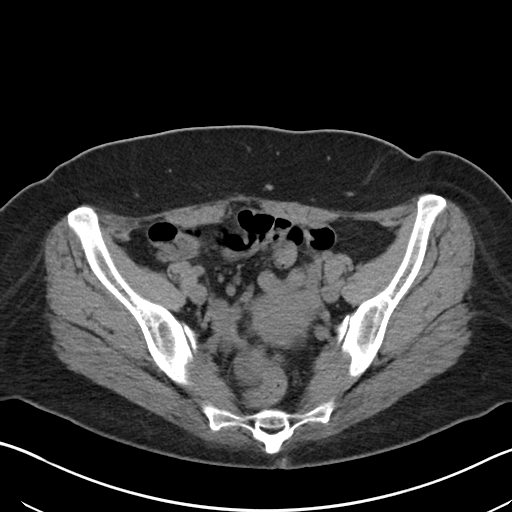
[im 31/92  soft-tissue]
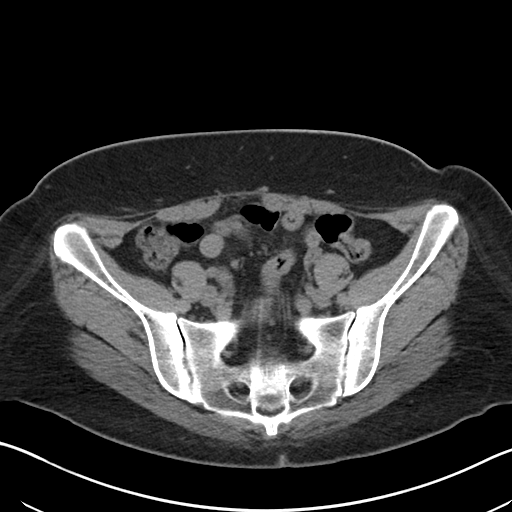
[im 38/92  soft-tissue]
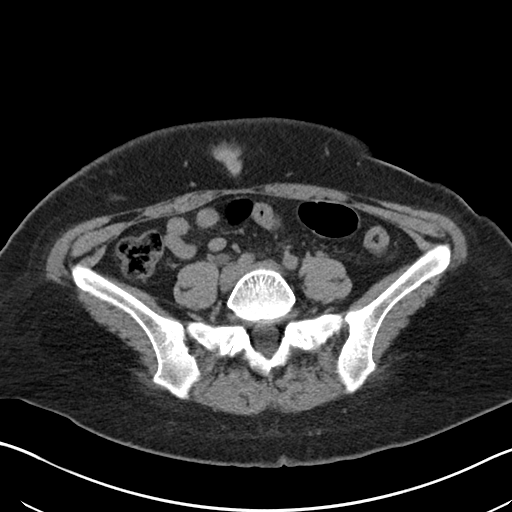
[im 46/92  soft-tissue]
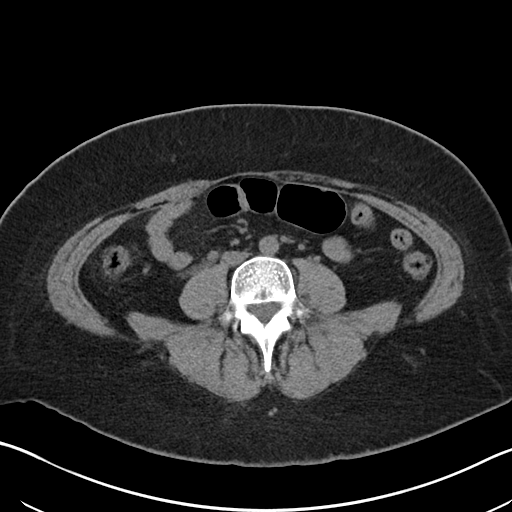
[im 54/92  soft-tissue]
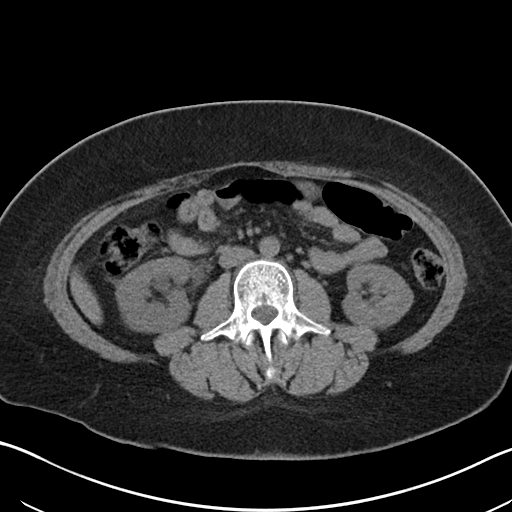
[im 61/92  soft-tissue]
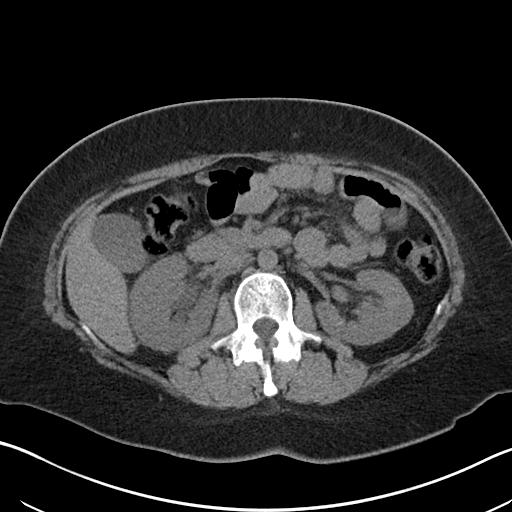
[im 61/92  bone]
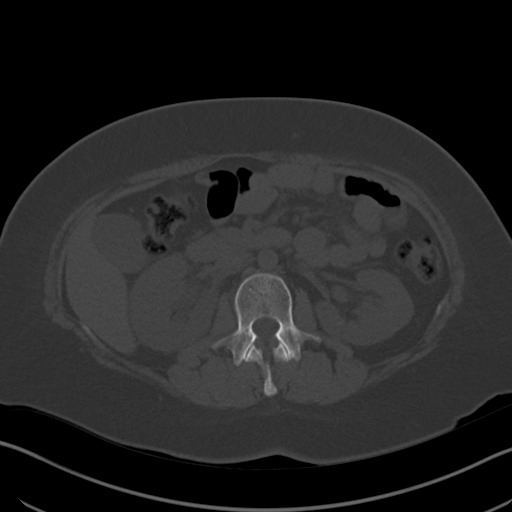
[im 65/92  soft-tissue]
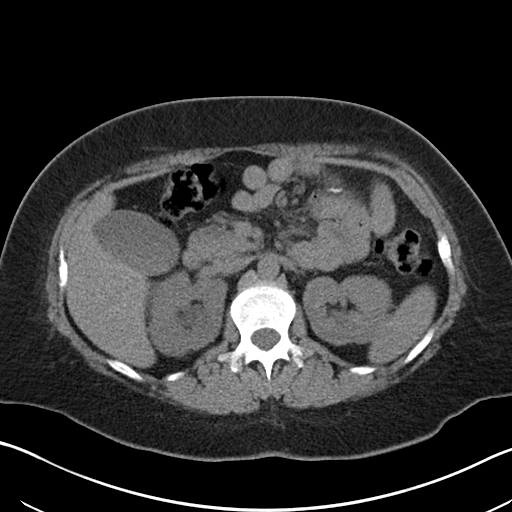
[im 73/92  soft-tissue]
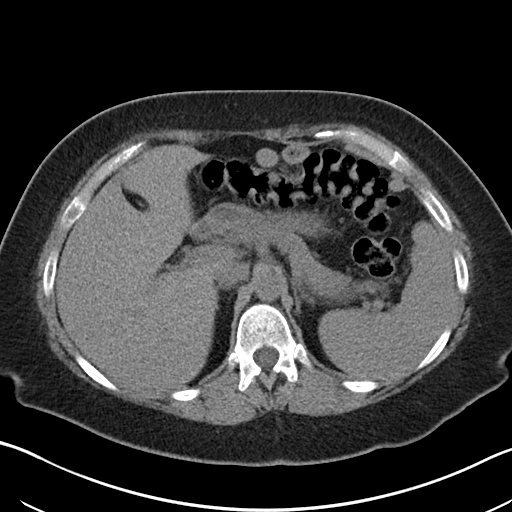
[im 80/92  soft-tissue]
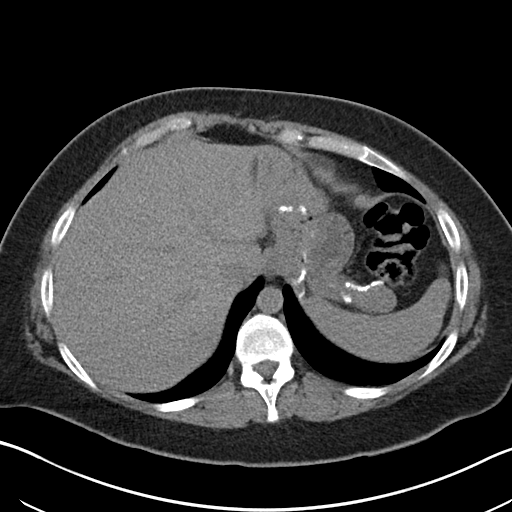
[im 88/92  soft-tissue]
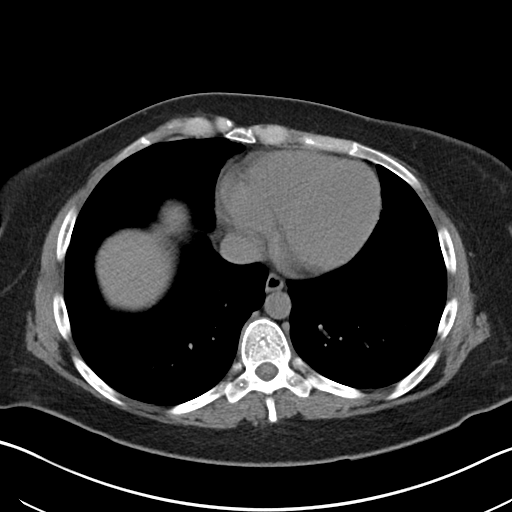

[Series 5: coronal soft tissue · coronal · 0.92mm/px · 3 of 79 slices shown]
[im 27/79  soft-tissue]
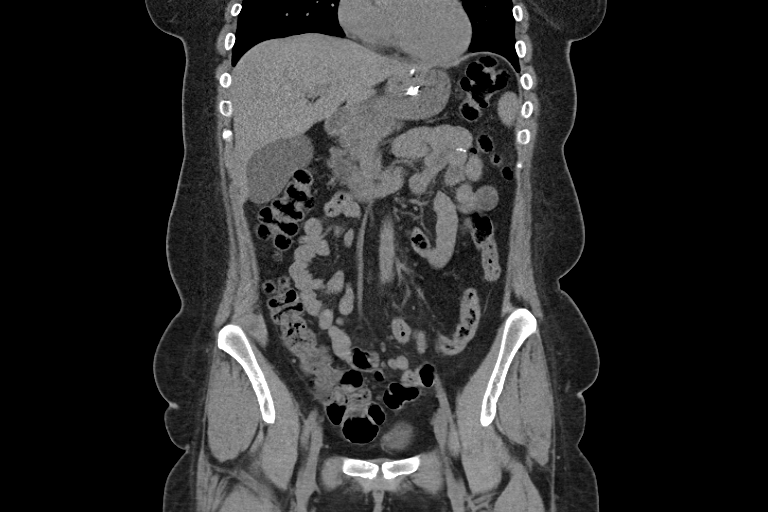
[im 35/79  soft-tissue]
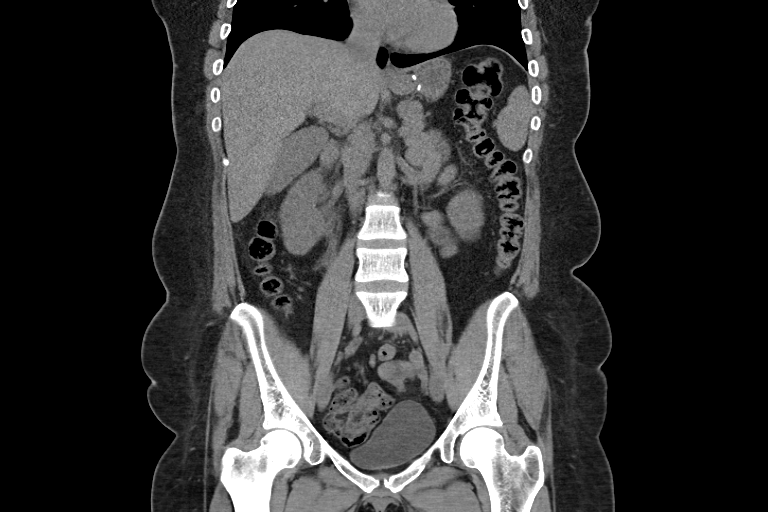
[im 44/79  soft-tissue]
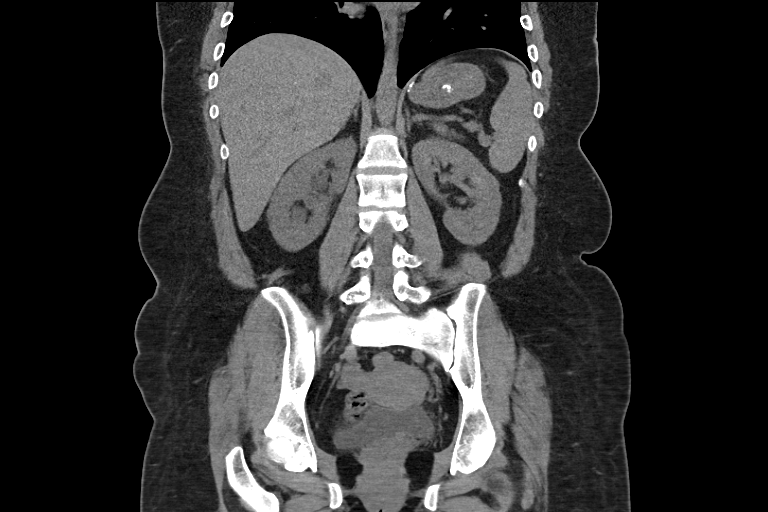

[16 of 46 positions shown; findings below may reference images not displayed]

FINDINGS: There is no pleural effusion.  Lung bases appear clear.

There is no focal liver abnormality identified. The gallbladder
appears normal. No biliary dilatation. Normal appearance of the
pancreas. The spleen is negative.

The adrenal glands are both normal. There is a stone within the mid
right kidney measuring 3 mm, image 34/ series 2. There is
right-sided pelvocaliectasis and hydroureter. Within the distal
right ureter there is a small stone measuring 2-3 mm, image
75/series 2. Previous left proximal ureteral calculus has resolved.
Urinary bladder appears normal. The uterus and the adnexal
structures are unremarkable.

Normal caliber of the abdominal aorta. There is no aneurysm. There
is no upper abdominal adenopathy identified. No pelvic or inguinal
adenopathy identified.

Small amount of free fluid is noted within the dependent portion of
the pelvis.

There are postoperative changes from Roux-en-Y gastric bypass
surgery. The small bowel loops have a normal course and caliber. No
obstruction. Normal appearance of the colon.
IMPRESSION: 1. Distal right ureteral stone measures 2-3 mm. There is associated
right-sided pelvocaliectasis and hydroureter.

## 2013-05-07 MED ORDER — OXYCODONE-ACETAMINOPHEN 5-325 MG PO TABS: 1.0000 | ORAL_TABLET | ORAL | Status: DC | PRN

## 2013-05-07 MED ORDER — ONDANSETRON HCL 4 MG/2ML IJ SOLN
4.0000 mg | Freq: Once | INTRAMUSCULAR | Status: AC
  Administered 2013-05-07: 4 mg via INTRAVENOUS
  Filled 2013-05-07: qty 2

## 2013-05-07 MED ORDER — MORPHINE SULFATE 4 MG/ML IJ SOLN
4.0000 mg | Freq: Once | INTRAMUSCULAR | Status: AC
  Administered 2013-05-07: 4 mg via INTRAVENOUS
  Filled 2013-05-07: qty 1

## 2013-05-07 MED ORDER — ONDANSETRON 4 MG PO TBDP: 4.0000 mg | ORAL_TABLET | Freq: Three times a day (TID) | ORAL | Status: DC | PRN

## 2013-05-07 MED ORDER — TAMSULOSIN HCL 0.4 MG PO CAPS: 0.4000 mg | ORAL_CAPSULE | Freq: Every day | ORAL | Status: DC

## 2013-05-07 MED ORDER — KETOROLAC TROMETHAMINE 30 MG/ML IJ SOLN
30.0000 mg | Freq: Once | INTRAMUSCULAR | Status: DC
  Filled 2013-05-07: qty 1

## 2013-05-07 NOTE — ED Notes (Signed)
Pt states she began having R flank pain last night associated with nausea and vomiting.  Pt has hx of kidney stones.

## 2013-05-07 NOTE — ED Provider Notes (Signed)
Medical screening examination/treatment/procedure(s) were conducted as a shared visit with non-physician practitioner(s) and myself.  I personally evaluated the patient during the encounter.  EKG Interpretation   None      History and physical consistent with a right-sided ureteral stone. CT scan confirms same. Pain management.  Donnetta Hutching, MD 05/07/13 [phone]

## 2013-05-07 NOTE — ED Provider Notes (Signed)
CSN: [id number]     Arrival date & time 05/07/13  0940 History   First MD Initiated Contact with Patient 05/07/13 [phone]     Chief Complaint  Patient presents with  . Flank Pain   (Consider location/radiation/quality/duration/timing/severity/associated sxs/prior Treatment) The history is provided by the patient and medical records.   This is a 27 year old female has history significant for anxiety, GERD, PCOS, depression, status post gastric bypass, presenting to the ED for sudden onset of right flank pain and hematuria last night. Patient states pain continued today and is now associated with some nausea but no vomiting. Patient has a prior history of kidney stones, last episode in 2012 requiring lithotripsy. Patient denies any recent fevers, sweats, or chills.  Pt is followed regularly by [facility].  Past Medical History  Diagnosis Date  . OBESITY   . DEPRESSION   . Anxiety   . Infertility, female hx   . Anemia     after 1st delivery  . GERD (gastroesophageal reflux disease)     pregnancy related- no meds  . PCOS (polycystic ovarian syndrome)   . Morbid obesity   . HYPOTHYROIDISM     hx of  . Headache(784.0)     hx of migraines  . Pregnancy induced hypertension     gestational  . Renal disorder    Past Surgical History  Procedure Laterality Date  . Cesarean section      x2  . Tonsillectomy    . Wisdom tooth extraction    . Tubal ligation    . Lithotripsy Left   . 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 Upper gi endoscopy  08/24/2012    Procedure: UPPER GI ENDOSCOPY;  Surgeon: Atilano Ina, MD;  Location: [facility];  Service: General;;   Family History  Problem Relation Age of Onset  . Diabetes Other   . Hyperlipidemia Other   . Hypertension Other   . Hypertension Mother   . Hyperlipidemia Father    . Hypertension Father   . Kidney disease Father   . Cancer Father   . Hypertension Maternal Grandmother   . Diabetes Maternal Grandfather    History  Substance Use Topics  . Smoking status: Never Smoker   . Smokeless tobacco: Never Used  . Alcohol Use: Yes     Comment: rare   OB History   Grav Para Term Preterm Abortions TAB SAB Ect Mult Living   2 2 2  0 0 0 0 0 0 2     Review of Systems  Genitourinary: Positive for hematuria and flank pain.  All other systems reviewed and are negative.    Allergies  Review of patient's allergies indicates no known allergies.  Home Medications   Current Outpatient Rx  Name  Route  Sig  Dispense  Refill  . Acetaminophen (TYLENOL PO)   Oral   Take 500 mg by mouth as needed.         . Calcium Citrate-Vitamin D (CALCIUM CITRATE + D PO)   Oral   Take 1,200 mg by mouth daily.          . Cyanocobalamin (NASCOBAL) 500 MCG/0.1ML SOLN   Nasal   Place 0.1 mLs into the nose once a week. Take on mondays         . IRON PO   Oral  Take 270 mg by mouth daily.          . Multiple Vitamin (MULTI-VITAMIN DAILY PO)   Oral   Take 1 tablet by mouth 2 (two) times daily.          . Naproxen Sodium 220 MG CAPS   Oral   Take 500 mg by mouth.         . venlafaxine (EFFEXOR) 37.5 MG tablet   Oral   Take 1 tablet (37.5 mg total) by mouth 2 (two) times daily.   60 tablet   5   . vitamin C (ASCORBIC ACID) 500 MG tablet   Oral   Take 500 mg by mouth daily.         Marland Kitchen zolpidem (AMBIEN) 10 MG tablet   Oral   Take 10 mg by mouth at bedtime as needed for sleep.          BP 144/87  Pulse 72  Temp(Src) 98.2 F (36.8 C) (Oral)  Resp 20  Wt 192 lb (87.091 kg)  SpO2 100%  LMP 04/06/2013  Physical Exam  Nursing note and vitals reviewed. Constitutional: She is oriented to person, place, and time. She appears well-developed and well-nourished. No distress.  HENT:  Head: Normocephalic and atraumatic.  Mouth/Throat: Oropharynx  is clear and moist.  Eyes: Conjunctivae and EOM are normal. Pupils are equal, round, and reactive to light.  Neck: Normal range of motion. Neck supple.  Cardiovascular: Normal rate, regular rhythm and normal heart sounds.   Pulmonary/Chest: Effort normal and breath sounds normal. No respiratory distress. She has no wheezes.  Abdominal: Soft. Bowel sounds are normal. There is no tenderness. There is CVA tenderness. There is no guarding, no tenderness at McBurney's point and negative Murphy's sign.  Right CVA TTP  Musculoskeletal: Normal range of motion. She exhibits no edema.  Neurological: She is alert and oriented to person, place, and time.  Skin: Skin is warm and dry. She is not diaphoretic.  Psychiatric: She has a normal mood and affect.    ED Course  Procedures (including critical care time) Labs Review Labs Reviewed  BASIC METABOLIC PANEL - Abnormal; Notable for the following:    Potassium 3.3 (*)    All other components within normal limits  URINALYSIS, ROUTINE W REFLEX MICROSCOPIC - Abnormal; Notable for the following:    Color, Urine RED (*)    APPearance TURBID (*)    Hgb urine dipstick LARGE (*)    Bilirubin Urine MODERATE (*)    Ketones, ur 15 (*)    Protein, ur 100 (*)    Leukocytes, UA SMALL (*)    All other components within normal limits  CBC WITH DIFFERENTIAL  URINE MICROSCOPIC-ADD ON  POCT PREGNANCY, URINE   Imaging Review Ct Abdomen Pelvis Wo Contrast  05/07/2013   CLINICAL DATA:  Right flank pain  EXAM: CT ABDOMEN AND PELVIS WITHOUT CONTRAST  TECHNIQUE: Multidetector CT imaging of the abdomen and pelvis was performed following the standard protocol without intravenous contrast.  COMPARISON:  04/24/2011  FINDINGS: There is no pleural effusion.  Lung bases appear clear.  There is no focal liver abnormality identified. The gallbladder appears normal. No biliary dilatation. Normal appearance of the pancreas. The spleen is negative.  The adrenal glands are both  normal. There is a stone within the mid right kidney measuring 3 mm, image 34/ series 2. There is right-sided pelvocaliectasis and hydroureter. Within the distal right ureter there is a small stone measuring 2-3 mm, image  75/series 2. Previous left proximal ureteral calculus has resolved. Urinary bladder appears normal. The uterus and the adnexal structures are unremarkable.  Normal caliber of the abdominal aorta. There is no aneurysm. There is no upper abdominal adenopathy identified. No pelvic or inguinal adenopathy identified.  Small amount of free fluid is noted within the dependent portion of the pelvis.  There are postoperative changes from Roux-en-Y gastric bypass surgery. The small bowel loops have a normal course and caliber. No obstruction. Normal appearance of the colon.  IMPRESSION: 1. Distal right ureteral stone measures 2-3 mm. There is associated right-sided pelvocaliectasis and hydroureter.   Electronically Signed   By: Signa Kell M.D.   On: 05/07/2013 11:06    EKG Interpretation   None       MDM   1. Ureterolithiasis    Labs as above, largely WNL.  U/a without signs of infection.  CT abd pelvis revealing small 2-78mm stone in right distal ureter.  Pt afebrile, non-toxic appearing, NAD, VS stable- ok for discharge.  Rx zofran, flomax, and percocet.  Instructed to strain all urine to monitor for passage of stone.  FU with [facility] if no improvement of sx in the next few days.  Discussed plan with pt, she agreed.  Return precautions advised.  Garlon Hatchet, PA-C 05/07/13 1309

## 2013-05-17 ENCOUNTER — Encounter: Payer: Self-pay | Admitting: Emergency Medicine

## 2013-05-17 ENCOUNTER — Emergency Department
Admission: EM | Admit: 2013-05-17 | Discharge: 2013-05-17 | Disposition: A | Discharge status: Home / Self Care | Payer: 59 | Source: Home / Self Care | Attending: Emergency Medicine | Admitting: Emergency Medicine

## 2013-05-17 DIAGNOSIS — J069 Acute upper respiratory infection, unspecified: Secondary | ICD-10-CM

## 2013-05-17 DIAGNOSIS — J029 Acute pharyngitis, unspecified: Secondary | ICD-10-CM

## 2013-05-17 MED ORDER — AZITHROMYCIN 250 MG PO TABS: ORAL_TABLET | ORAL | Status: DC

## 2013-05-17 MED ORDER — GUAIFENESIN-CODEINE 100-10 MG/5ML PO SYRP: 5.0000 mL | ORAL_SOLUTION | Freq: Four times a day (QID) | ORAL | Status: DC | PRN

## 2013-05-17 NOTE — ED Notes (Signed)
Pt c/o hoarseness, cough productive at times, nasal congestion, achy, and fatigue x 5 days. Denies fever.

## 2013-05-17 NOTE — ED Provider Notes (Signed)
CSN: [id number]     Arrival date & time 05/17/13  1810 History   First MD Initiated Contact with Patient 05/17/13 1828     Chief Complaint  Patient presents with  . Sore Throat  . Headache   (Consider location/radiation/quality/duration/timing/severity/associated sxs/prior Treatment) HPI [name] [name] is a 27 y.o. female who complains of onset of cold symptoms for 6 days.  The symptoms are constant and mild-moderate in severity.  She is a Associate Professor.  Also has been around a kid that had strep throat last week. + sore throat (main symptom) + cough + hoarse No pleuritic pain No wheezing + nasal congestion + post-nasal drainage No sinus pain/pressure No chest congestion No itchy/red eyes No earache No hemoptysis No SOB No chills/sweats No fever No nausea No vomiting No abdominal pain No diarrhea No skin rashes No fatigue + myalgias + headache     Past Medical History  Diagnosis Date  . OBESITY   . DEPRESSION   . Anxiety   . Infertility, female hx   . Anemia     after 1st delivery  . GERD (gastroesophageal reflux disease)     pregnancy related- no meds  . PCOS (polycystic ovarian syndrome)   . Morbid obesity   . HYPOTHYROIDISM     hx of  . Headache(784.0)     hx of migraines  . Pregnancy induced hypertension     gestational  . Renal disorder    Past Surgical History  Procedure Laterality Date  . Cesarean section      x2  . Tonsillectomy    . Wisdom tooth extraction    . Tubal ligation    . Lithotripsy Left   . 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 Upper gi endoscopy  08/24/2012    Procedure: UPPER GI ENDOSCOPY;  Surgeon: Atilano Ina, MD;  Location: [facility];  Service: General;;   Family History  Problem Relation Age of Onset  . Diabetes Other   . Hyperlipidemia Other   . Hypertension  Other   . Hypertension Mother   . Hyperlipidemia Father   . Hypertension Father   . Kidney disease Father   . Cancer Father   . Hypertension Maternal Grandmother   . Diabetes Maternal Grandfather    History  Substance Use Topics  . Smoking status: Never Smoker   . Smokeless tobacco: Never Used  . Alcohol Use: No     Comment: rare   OB History   Grav Para Term Preterm Abortions TAB SAB Ect Mult Living   2 2 2  0 0 0 0 0 0 2     Review of Systems  All other systems reviewed and are negative.    Allergies  Review of patient's allergies indicates no known allergies.  Home Medications   Current Outpatient Rx  Name  Route  Sig  Dispense  Refill  . Acetaminophen (TYLENOL PO)   Oral   Take 500 mg by mouth as needed.         Marland Kitchen azithromycin (ZITHROMAX Z-PAK) 250 MG tablet      Use as directed   1 each   0   . Calcium Citrate-Vitamin D (CALCIUM CITRATE + D PO)   Oral   Take 1,200 mg by mouth daily.          . Cyanocobalamin (NASCOBAL)  500 MCG/0.1ML SOLN   Nasal   Place 0.1 mLs into the nose once a week. Take on mondays         . guaiFENesin-codeine (ROBITUSSIN AC) 100-10 MG/5ML syrup   Oral   Take 5 mLs by mouth 4 (four) times daily as needed for cough or congestion.   120 mL   0   . IRON PO   Oral   Take 270 mg by mouth daily.          . Multiple Vitamin (MULTI-VITAMIN DAILY PO)   Oral   Take 1 tablet by mouth 2 (two) times daily.          . Naproxen Sodium 220 MG CAPS   Oral   Take 500 mg by mouth.         . ondansetron (ZOFRAN ODT) 4 MG disintegrating tablet   Oral   Take 1 tablet (4 mg total) by mouth every 8 (eight) hours as needed for nausea.   10 tablet   0   . oxyCODONE-acetaminophen (PERCOCET/ROXICET) 5-325 MG per tablet   Oral   Take 1 tablet by mouth every 4 (four) hours as needed.   15 tablet   0   . tamsulosin (FLOMAX) 0.4 MG CAPS capsule   Oral   Take 1 capsule (0.4 mg total) by mouth daily after supper.   5 capsule    0   . venlafaxine (EFFEXOR) 37.5 MG tablet   Oral   Take 1 tablet (37.5 mg total) by mouth 2 (two) times daily.   60 tablet   5   . vitamin C (ASCORBIC ACID) 500 MG tablet   Oral   Take 500 mg by mouth daily.         Marland Kitchen zolpidem (AMBIEN) 10 MG tablet   Oral   Take 10 mg by mouth at bedtime as needed for sleep.          BP 125/87  Pulse 89  Temp(Src) 98 F (36.7 C) (Oral)  Resp 16  Ht 5\' 4"  (1.626 m)  Wt 197 lb (89.359 kg)  BMI 33.80 kg/m2  SpO2 95%  LMP 04/06/2013 Physical Exam  Nursing note and vitals reviewed. Constitutional: She is oriented to person, place, and time. She appears well-developed and well-nourished.  HENT:  Head: Normocephalic and atraumatic.  Right Ear: Tympanic membrane, external ear and ear canal normal.  Left Ear: Tympanic membrane, external ear and ear canal normal.  Nose: Mucosal edema and rhinorrhea present.  Mouth/Throat: Posterior oropharyngeal erythema present. No oropharyngeal exudate or posterior oropharyngeal edema.  Eyes: No scleral icterus.  Neck: Neck supple.  Cardiovascular: Regular rhythm and normal heart sounds.   Pulmonary/Chest: Effort normal and breath sounds normal. No respiratory distress.  Neurological: She is alert and oriented to person, place, and time.  Skin: Skin is warm and dry.  Psychiatric: She has a normal mood and affect. Her speech is normal.    ED Course  Procedures (including critical care time) Labs Review Labs Reviewed  POCT RAPID STREP A (OFFICE)   Imaging Review No results found.  EKG Interpretation    Date/Time:    Ventricular Rate:    PR Interval:    QRS Duration:   QT Interval:    QTC Calculation:   R Axis:     Text Interpretation:              MDM   1. Acute pharyngitis   2. Acute upper respiratory infections of unspecified site  1)  Rx for codeine cough syrup.  Rapid strep negative, no culture done.  Take antibiotic as directed. 2)  Use nasal saline solution (over the  counter) at least 3 times a day. 3)  Use over the counter decongestants like Zyrtec-D every 12 hours as needed to help with congestion.  If you have hypertension, do not take medicines with sudafed.  4)  Can take tylenol every 6 hours or motrin every 8 hours for pain or fever. 5)  Follow up with your primary doctor if no improvement in 5-7 days, sooner if increasing pain, fever, or new symptoms.      Marlaine Hind, MD 05/17/13 2027

## 2013-05-24 ENCOUNTER — Encounter: Payer: 59 | Attending: General Surgery | Admitting: Dietician

## 2013-05-24 VITALS — Ht 63.0 in | Wt 195.5 lb

## 2013-05-24 DIAGNOSIS — Z713 Dietary counseling and surveillance: Secondary | ICD-10-CM | POA: Insufficient documentation

## 2013-05-24 DIAGNOSIS — Z01818 Encounter for other preprocedural examination: Secondary | ICD-10-CM | POA: Insufficient documentation

## 2013-05-24 DIAGNOSIS — E669 Obesity, unspecified: Secondary | ICD-10-CM

## 2013-05-24 NOTE — Progress Notes (Signed)
Follow-up visit:  9 Months Post-Operative RYGB Surgery  Medical Nutrition Therapy:  Appt start time: 1215  End time:  1245.  Primary concerns today: Post-operative Bariatric Surgery Nutrition Management. Gerianne returns for f/u today. Lost about 16.5 lbs since the last visit. Still having some soda at night, though not as much as before. Tolerating everything she is eating.   Surgery date: 08/24/12  Surgery type: RYGB  Start weight at [facility]: 285.3 lbs (06/29/12)  Pre-Op Class weight: 286.5 lbs (08/05/12)  Weight today: 195.5 lbs Weight change:16.5 lbs Total weight lost: 91 lbs (08/05/12)  Goat weight: 150 lbs % weight met: 67%  TANITA  BODY COMP RESULTS  09/07/12 10/19/12 11/16/12 02/22/13 05/24/13   BMI (kg/m^2) 46.9 43.8 41.6 36.4 34.6   Fat Mass (lbs) 147.0 132.0 121.5 96.0 86.0   Fat Free Mass (lbs) 118.0 115.0 113.5 116.0 109.5   Total Body Water (lbs) 86.5 84.0 83.0 85.0 80.0   24-hr recall: B (AM): Yogurt greek (12g) OR 1/2 Atkins protein shake  Snk (AM): other 1/2 shake OR cheese cubes  L (PM):  [place] and cheese OR canned tuna Snk (PM): 1% milk  D (PM): 3 oz salmon, green beans or other vegetables and beans Snk (PM): none  Fluid intake: 11 oz protein shake, water = 60-80 oz, 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   Progress Towards Goal(s):  In progress.   Nutritional Diagnosis:  [place]-3.3 Overweight/obesity related to past poor dietary habits and physical  inactivity as evidenced by patient w/ recent RYGB surgery following dietary guidelines for continued weight loss.    Intervention:  Nutrition education/diet advancement.  Suggested trying unsweet tea instead of soda for caffeine while at work.   Monitoring/Evaluation:  Dietary intake, exercise, and body weight. Follow up in 3 months for 12 month post-op visit.

## 2013-05-24 NOTE — Patient Instructions (Addendum)
Goals:  Follow Phase 3B: High Protein + Non-Starchy Vegetables  Continue having lean protein foods to meet 60-80g goal  Increase fluid intake to 64oz +  Add 15 grams of carbohydrate (fruit, whole grain) with meals  Avoid drinking 15 minutes before, during and 30 minutes after eating  Aim for >30 min of physical activity daily  Deva multivitmin minis for multivitamins (or cut to size of eraser on pencil)  Limit soda, switch to coffee or unsweet tea

## 2013-06-01 ENCOUNTER — Encounter (INDEPENDENT_AMBULATORY_CARE_PROVIDER_SITE_OTHER): Payer: Self-pay | Admitting: General Surgery

## 2013-06-15 ENCOUNTER — Other Ambulatory Visit: Payer: Self-pay | Admitting: Internal Medicine

## 2013-06-15 NOTE — Telephone Encounter (Signed)
Done hardcopy to [facility]

## 2013-06-15 NOTE — Telephone Encounter (Signed)
Script faxed to pharmacy to # [phone]

## 2013-07-13 ENCOUNTER — Telehealth: Payer: Self-pay | Admitting: *Deleted

## 2013-07-13 ENCOUNTER — Ambulatory Visit (INDEPENDENT_AMBULATORY_CARE_PROVIDER_SITE_OTHER): Payer: 59 | Admitting: Internal Medicine

## 2013-07-13 ENCOUNTER — Encounter: Payer: Self-pay | Admitting: Internal Medicine

## 2013-07-13 VITALS — BP 124/80 | HR 85 | Temp 98.0°F | Wt 185.1 lb

## 2013-07-13 DIAGNOSIS — F411 Generalized anxiety disorder: Secondary | ICD-10-CM

## 2013-07-13 DIAGNOSIS — F3289 Other specified depressive episodes: Secondary | ICD-10-CM

## 2013-07-13 DIAGNOSIS — F329 Major depressive disorder, single episode, unspecified: Secondary | ICD-10-CM

## 2013-07-13 MED ORDER — ESCITALOPRAM OXALATE 10 MG PO TABS: 10.0000 mg | ORAL_TABLET | Freq: Every day | ORAL | Status: DC

## 2013-07-13 MED ORDER — ALPRAZOLAM 0.5 MG PO TABS: 0.5000 mg | ORAL_TABLET | Freq: Two times a day (BID) | ORAL | Status: DC | PRN

## 2013-07-13 NOTE — Telephone Encounter (Signed)
Patient called stating that her rx for Xanax was not at the pharmacy. Called pharmacy and gave rx verbally over the phone.

## 2013-07-13 NOTE — Assessment & Plan Note (Signed)
On SNRI, but causing nausea since bariatric procedure Increasing anxiety, largely situational related to stressors of child raising Associated with poor sleep and irritability Denies SI/HI Agrees to counseling, will begin with EAP -We'll call if referral needed  Begin trial Lexapro as patient's dad taking and doing well on same  we reviewed potential risk/benefit and possible side effects - pt understands and agrees to same  Followup in 6 weeks to review symptoms and titrate as needed, patient agrees to call sooner if worse  Also low-dose Xanax to use as needed for panic attack symptoms

## 2013-07-13 NOTE — Patient Instructions (Addendum)
It was good to see you today.  We have reviewed your prior records including labs and tests today  Medications reviewed and updated Begin Lexapro 10 mg once daily and use Xanax as needed for panic attack symptoms -no other changes recommended at this time Your prescription(s) have been submitted to your pharmacy. Please take as directed and contact our office if you believe you are having problem(s) with the medication(s).  Followup for counseling either with EAP or call for referral if needed  Followup here in 6 weeks to review medications and symptoms, please call sooner if problems  Depression, Adult Depression is feeling sad, low, down in the dumps, blue, gloomy, or empty. In general, there are two kinds of depression:  Normal sadness or grief. This can happen after something upsetting. It often goes away on its own within 2 weeks. After losing a loved one (bereavement), normal sadness and grief may last longer than two weeks. It usually gets better with time.  Clinical depression. This kind lasts longer than normal sadness or grief. It keeps you from doing the things you normally do in life. It is often hard to function at home, work, or at school. It may affect your relationships with others. Treatment is often needed. GET HELP RIGHT AWAY IF:  You have thoughts about hurting yourself or others.  You lose touch with reality (psychotic symptoms). You may:  See or hear things that are not real.  Have untrue beliefs about your life or people around you.  Your medicine is giving you problems. MAKE SURE YOU:  Understand these instructions.  Will watch your condition.  Will get help right away if you are not doing well or get worse. Document Released: 07/05/2010 Document Revised: 02/25/2012 Document Reviewed: 10/02/2011 [facility] Patient Information 2014 [place]Care, [place].

## 2013-07-13 NOTE — Progress Notes (Signed)
   Subjective:    Patient ID: [name] DixonSamantha D [name], female    DOB: Oct 27, 1985, 28 y.o.   MRN: [id number]  Anxiety Symptoms include decreased concentration and nervous/anxious behavior. Patient reports no suicidal ideas.      Plans of increasing anxiety and depression Unable to tolerate generic Effexor since gastric bypass surgery  Past Medical History  Diagnosis Date  . OBESITY   . DEPRESSION   . Anxiety   . Infertility, female hx   . Anemia     after 1st delivery  . GERD (gastroesophageal reflux disease)     pregnancy related- no meds  . PCOS (polycystic ovarian syndrome)   . Morbid obesity   . HYPOTHYROIDISM     hx of  . Headache(784.0)     hx of migraines  . Pregnancy induced hypertension     gestational  . Renal disorder     Review of Systems  Constitutional: Positive for fatigue. Negative for fever and unexpected weight change.  Psychiatric/Behavioral: Positive for sleep disturbance, dysphoric mood, decreased concentration and agitation. Negative for suicidal ideas and self-injury. The patient is nervous/anxious. The patient is not hyperactive.        Objective:   Physical Exam BP 124/80  Pulse 85  Temp(Src) 98 F (36.7 C) (Oral)  Wt 185 lb 1.9 oz (83.97 kg)  SpO2 98% Wt Readings from Last 3 Encounters:  07/13/13 185 lb 1.9 oz (83.97 kg)  05/24/13 195 lb 8 oz (88.678 kg)  05/17/13 197 lb (89.359 kg)   Constitutional: She appears well-developed and well-nourished. No distress. 2 young hyperactive dtrs in room Neck: Normal range of motion. Neck supple. No JVD present. No thyromegaly present.  Cardiovascular: Normal rate, regular rhythm and normal heart sounds.  No murmur heard. No BLE edema. Pulmonary/Chest: Effort normal and breath sounds normal. No respiratory distress. She has no wheezes.  Psychiatric: She has a anxious and dysphoric, occ tearfull mood and affect. Her behavior is normal. Judgment and thought content normal.    Lab Results  Component  Value Date   WBC 8.6 05/07/2013   HGB 12.6 05/07/2013   HCT 37.3 05/07/2013   PLT 290 05/07/2013   GLUCOSE 79 05/07/2013   CHOL 155 04/14/2013   TRIG 110 04/14/2013   HDL 49 04/14/2013   LDLDIRECT 140.3 04/03/2010   LDLCALC 84 04/14/2013   ALT 9 04/14/2013   AST 11 04/14/2013   NA 138 05/07/2013   K 3.3* 05/07/2013   CL 101 05/07/2013   CREATININE 0.79 05/07/2013   BUN 16 05/07/2013   CO2 25 05/07/2013   TSH 2.067 06/23/2012        Assessment & Plan:   See problem list. Medications and labs reviewed today.

## 2013-07-13 NOTE — Progress Notes (Signed)
Pre-visit discussion using our clinic review tool. No additional management support is needed unless otherwise documented below in the visit note.  

## 2013-07-13 NOTE — Progress Notes (Signed)
[name] [name] [id number] 07/13/2013   Chief Complaint  Patient presents with  . Anxiety    Subjective  Anxiety The problem has been gradually worsening. Symptoms include excessive worry, insomnia, irritability, nausea, nervous/anxious behavior, panic and restlessness. Patient reports no chest pain, compulsions, feeling of choking, hyperventilation, impotence, malaise, obsessions, palpitations, shortness of breath or suicidal ideas. Symptoms occur constantly. The severity of symptoms is moderate. 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   Her past medical history is significant for anxiety/panic attacks and depression. Past treatments include SSRIs and non-SSRI antidepressants. Compliance with prior treatments has been good. Prior compliance problems include medication issues. Side effects of treatment include GI discomfort.    Past Medical History  Diagnosis Date  . OBESITY   . DEPRESSION   . Anxiety   . Infertility, female hx   . Anemia     after 1st delivery  . GERD (gastroesophageal reflux disease)     pregnancy related- no meds  . PCOS (polycystic ovarian syndrome)   . Morbid obesity   . HYPOTHYROIDISM     hx of  . Headache(784.0)     hx of migraines  . Pregnancy induced hypertension     gestational  . Renal disorder     Past Surgical History  Procedure Laterality Date  . Cesarean section      x2  . Tonsillectomy    . Wisdom tooth extraction    . Tubal ligation    . Lithotripsy Left   . 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 Upper gi endoscopy  08/24/2012    Procedure: UPPER GI ENDOSCOPY;  Surgeon: Atilano Ina, MD;  Location: [facility];  Service: General;;    Family History  Problem Relation Age of Onset  . Diabetes Other   .  Hyperlipidemia Other   . Hypertension Other   . Hypertension Mother   . Hyperlipidemia Father   . Hypertension Father   . Kidney disease Father   . Cancer Father   . Hypertension Maternal Grandmother   . Diabetes Maternal Grandfather     History  Substance Use Topics  . Smoking status: Never Smoker   . Smokeless tobacco: Never Used  . Alcohol Use: No     Comment: rare    Current Outpatient Prescriptions on File Prior to Visit  Medication Sig Dispense Refill  . Acetaminophen (TYLENOL PO) Take 500 mg by mouth as needed.      . Calcium Citrate-Vitamin D (CALCIUM CITRATE + D PO) Take 1,200 mg by mouth daily.       . Cyanocobalamin (NASCOBAL) 500 MCG/0.1ML SOLN Place 0.1 mLs into the nose once a week. Take on mondays      . IRON PO Take 270 mg by mouth daily.       . Multiple Vitamin (MULTI-VITAMIN DAILY PO) Take 1 tablet by mouth 2 (two) times daily.       . Naproxen Sodium 220 MG CAPS Take 500 mg by mouth.      . ondansetron (ZOFRAN ODT) 4 MG disintegrating tablet Take 1 tablet (4 mg total) by mouth every 8 (eight) hours as needed for nausea.  10 tablet  0  . venlafaxine (EFFEXOR) 37.5 MG tablet Take 1 tablet (37.5 mg total) by mouth 2 (two) times daily.  60 tablet  5  . vitamin C (ASCORBIC ACID) 500 MG tablet Take 500 mg by mouth daily.      Marland Kitchen zolpidem (AMBIEN) 10 MG tablet TAKE 1 TABLET BY MOUTH AT BEDTIME AS NEEDED FOR SLEEP  30 tablet  0   No current facility-administered medications on file prior to visit.    Allergies: No Known Allergies  Review of Systems  Constitutional: Positive for irritability. Negative for fever, chills, weight loss and malaise/fatigue.  HENT: Negative for congestion and nosebleeds.   Eyes: Negative for blurred vision.  Respiratory: Negative for cough, shortness of breath and wheezing.   Cardiovascular: Negative for chest pain and palpitations.  Gastrointestinal: Positive for nausea. Negative for heartburn and vomiting.  Genitourinary: Negative  for impotence.  Neurological: Positive for headaches. Negative for speech change.  Psychiatric/Behavioral: Negative for depression and suicidal ideas. The patient is nervous/anxious and has insomnia.        Objective  Filed Vitals:   07/13/13 0941  BP: 124/80  Pulse: 85  Temp: 98 F (36.7 C)  TempSrc: Oral  Weight: 185 lb 1.9 oz (83.97 kg)  SpO2: 98%    Physical Exam  Nursing note and vitals reviewed. Constitutional: She is oriented to person, place, and time. She appears well-developed and well-nourished. No distress.  HENT:  Head: Normocephalic and atraumatic.  Eyes: Pupils are equal, round, and reactive to light.  Cardiovascular: Normal rate, regular rhythm and normal heart sounds.  Exam reveals no gallop and no friction rub.   No murmur heard. Respiratory: Effort normal and breath sounds normal. No respiratory distress. She has no wheezes. She exhibits no tenderness.  Musculoskeletal: Normal range of motion. She exhibits no tenderness.  Neurological: She is alert and oriented to person, place, and time.  Skin: She is not diaphoretic.  Psychiatric: Her behavior is normal. Judgment and thought content normal. Her mood appears anxious. Cognition and memory are normal.    BP Readings from Last 3 Encounters:  07/13/13 124/80  05/17/13 125/87  05/07/13 119/78    Wt Readings from Last 3 Encounters:  07/13/13 185 lb 1.9 oz (83.97 kg)  05/24/13 195 lb 8 oz (88.678 kg)  05/17/13 197 lb (89.359 kg)    Lab Results  Component Value Date   WBC 8.6 05/07/2013   HGB 12.6 05/07/2013   HCT 37.3 05/07/2013   PLT 290 05/07/2013   GLUCOSE 79 05/07/2013   CHOL 155 04/14/2013   TRIG 110 04/14/2013   HDL 49 04/14/2013   LDLDIRECT 140.3 04/03/2010   LDLCALC 84 04/14/2013   ALT 9 04/14/2013   AST 11 04/14/2013   NA 138 05/07/2013   K 3.3* 05/07/2013   CL 101 05/07/2013   CREATININE 0.79 05/07/2013   BUN 16 05/07/2013   CO2 25 05/07/2013   TSH 2.067 06/23/2012        Assessment and Plan  Depression/Anxiety-  Patient was on Venlafaxine for same.  Medication causing nausea since bariatric procedure.   Increase in stress due to child raising.  Patient reports poor sleeping habits as well as general irrtability.   Patient Denies SI/HI.   Patient aggreed to counseling. Will refer for counseling today.   Patient reports family member (father) on lexapro and wishes to try it.  Will perscribeLexapro today.  Also prescribe Xanax to help with panic episodes/insomina.  Patient was educated and understands the risk associated with this medication.  Will followup with patient in 6 weeks to adjust dose if needed.  Patient was instructed to notify the office if any new symptoms emerged or the current symptoms become worse.  Return in about 6 weeks (around 08/24/2013).  Jonna CoupBenda, Guy Alan, Student-PA    I have personally reviewed this case with PA student. I also personally examined this patient. I agree with history and findings as documented above. I reviewed, discussed and approve of the assessment and plan as listed above. Rene PaciValerie Leschber, MD

## 2013-07-18 ENCOUNTER — Emergency Department (HOSPITAL_BASED_OUTPATIENT_CLINIC_OR_DEPARTMENT_OTHER)
Admission: EM | Admit: 2013-07-18 | Discharge: 2013-07-18 | Disposition: A | Discharge status: Home / Self Care | Payer: 59 | Attending: Emergency Medicine | Admitting: Emergency Medicine

## 2013-07-18 ENCOUNTER — Encounter (HOSPITAL_BASED_OUTPATIENT_CLINIC_OR_DEPARTMENT_OTHER): Payer: Self-pay | Admitting: Emergency Medicine

## 2013-07-18 ENCOUNTER — Emergency Department (HOSPITAL_BASED_OUTPATIENT_CLINIC_OR_DEPARTMENT_OTHER): Payer: 59

## 2013-07-18 DIAGNOSIS — Z79899 Other long term (current) drug therapy: Secondary | ICD-10-CM | POA: Insufficient documentation

## 2013-07-18 DIAGNOSIS — Z9884 Bariatric surgery status: Secondary | ICD-10-CM | POA: Insufficient documentation

## 2013-07-18 DIAGNOSIS — Z8719 Personal history of other diseases of the digestive system: Secondary | ICD-10-CM | POA: Insufficient documentation

## 2013-07-18 DIAGNOSIS — R34 Anuria and oliguria: Secondary | ICD-10-CM | POA: Insufficient documentation

## 2013-07-18 DIAGNOSIS — Z8742 Personal history of other diseases of the female genital tract: Secondary | ICD-10-CM | POA: Insufficient documentation

## 2013-07-18 DIAGNOSIS — N200 Calculus of kidney: Secondary | ICD-10-CM | POA: Insufficient documentation

## 2013-07-18 DIAGNOSIS — Z87448 Personal history of other diseases of urinary system: Secondary | ICD-10-CM | POA: Insufficient documentation

## 2013-07-18 DIAGNOSIS — R300 Dysuria: Secondary | ICD-10-CM | POA: Insufficient documentation

## 2013-07-18 DIAGNOSIS — D649 Anemia, unspecified: Secondary | ICD-10-CM | POA: Insufficient documentation

## 2013-07-18 DIAGNOSIS — Z3202 Encounter for pregnancy test, result negative: Secondary | ICD-10-CM | POA: Insufficient documentation

## 2013-07-18 DIAGNOSIS — F411 Generalized anxiety disorder: Secondary | ICD-10-CM | POA: Insufficient documentation

## 2013-07-18 DIAGNOSIS — F3289 Other specified depressive episodes: Secondary | ICD-10-CM | POA: Insufficient documentation

## 2013-07-18 DIAGNOSIS — F329 Major depressive disorder, single episode, unspecified: Secondary | ICD-10-CM | POA: Insufficient documentation

## 2013-07-18 LAB — CBC
HCT: 32.5 % — ABNORMAL LOW (ref 36.0–46.0)
Hemoglobin: 10.9 g/dL — ABNORMAL LOW (ref 12.0–15.0)
MCH: 28.2 pg (ref 26.0–34.0)
MCHC: 33.5 g/dL (ref 30.0–36.0)
MCV: 84.2 fL (ref 78.0–100.0)
Platelets: 228 10*3/uL (ref 150–400)
RBC: 3.86 MIL/uL — AB (ref 3.87–5.11)
RDW: 14.4 % (ref 11.5–15.5)
WBC: 9.3 10*3/uL (ref 4.0–10.5)

## 2013-07-18 LAB — PREGNANCY, URINE: PREG TEST UR: NEGATIVE

## 2013-07-18 LAB — URINE MICROSCOPIC-ADD ON

## 2013-07-18 LAB — URINALYSIS, ROUTINE W REFLEX MICROSCOPIC
BILIRUBIN URINE: NEGATIVE
Glucose, UA: NEGATIVE mg/dL
Ketones, ur: NEGATIVE mg/dL
Leukocytes, UA: NEGATIVE
Nitrite: NEGATIVE
Protein, ur: NEGATIVE mg/dL
SPECIFIC GRAVITY, URINE: 1.009 (ref 1.005–1.030)
Urobilinogen, UA: 0.2 mg/dL (ref 0.0–1.0)
pH: 6 (ref 5.0–8.0)

## 2013-07-18 LAB — BASIC METABOLIC PANEL
BUN: 19 mg/dL (ref 6–23)
CALCIUM: 8.9 mg/dL (ref 8.4–10.5)
CO2: 27 mEq/L (ref 19–32)
CREATININE: 1 mg/dL (ref 0.50–1.10)
Chloride: 103 mEq/L (ref 96–112)
GFR calc non Af Amer: 75 mL/min — ABNORMAL LOW (ref >90)
GFR, EST AFRICAN AMERICAN: 87 mL/min — AB (ref >90)
Glucose, Bld: 89 mg/dL (ref 70–99)
Potassium: 4.3 mEq/L (ref 3.7–5.3)
Sodium: 140 mEq/L (ref 137–147)

## 2013-07-18 IMAGING — CT CT ABD-PELV W/O CM
2 of 3 series · 16 of 46 positions shown, 18 images · non-contrast
Comparison: [DATE]

CLINICAL DATA: Right flank pain. Unable to urinate. History of
kidney stones.

EXAM:
CT ABDOMEN AND PELVIS WITHOUT CONTRAST
TECHNIQUE: Multidetector CT imaging of the abdomen and pelvis was performed
following the standard protocol without intravenous contrast.

[Series 2: renal stone < 200 lbs 5.0 b31f · axial · 0.79mm/px · z∈[-416,-41]mm · 13 of 87 slices shown, 15 images]
[im 6/87  soft-tissue]
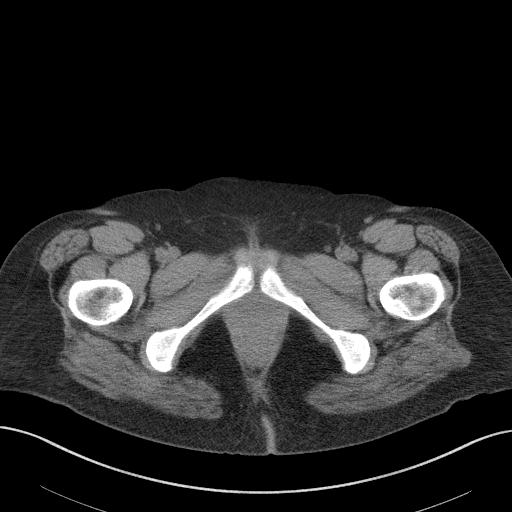
[im 6/87  bone]
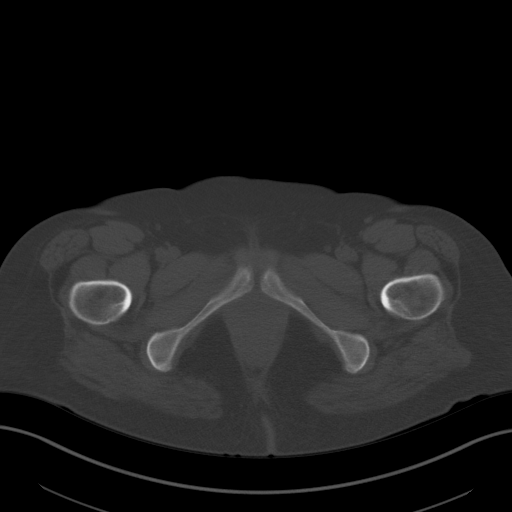
[im 12/87  soft-tissue]
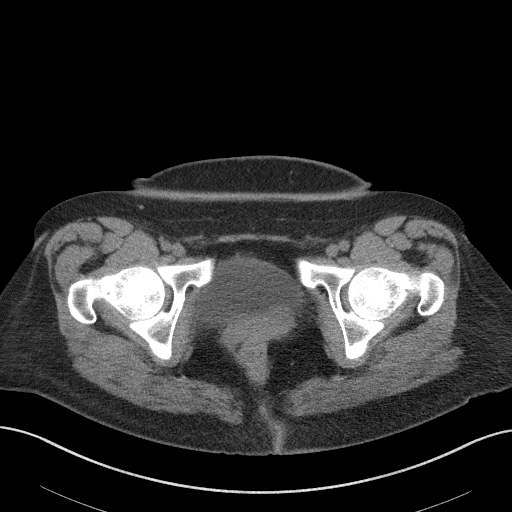
[im 17/87  soft-tissue]
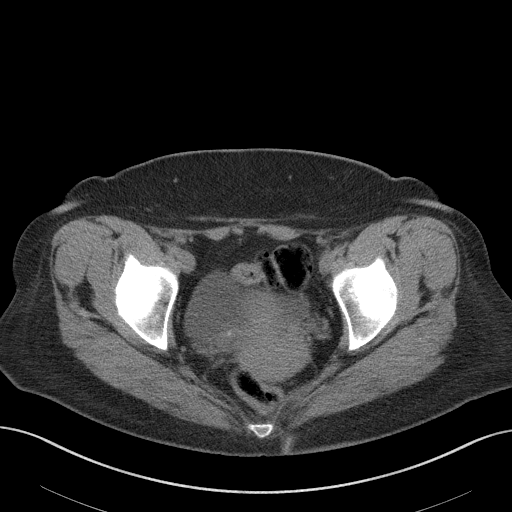
[im 25/87  soft-tissue]
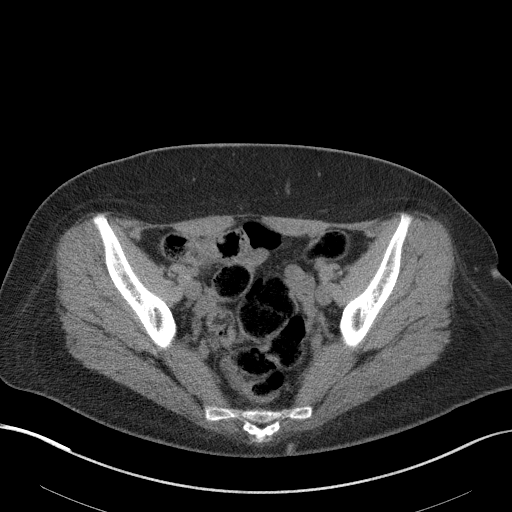
[im 31/87  soft-tissue]
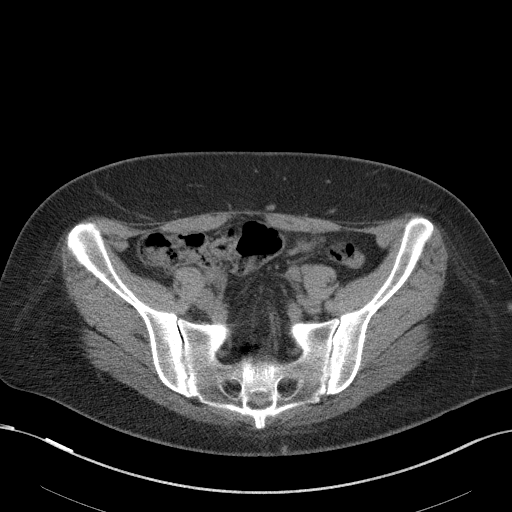
[im 37/87  soft-tissue]
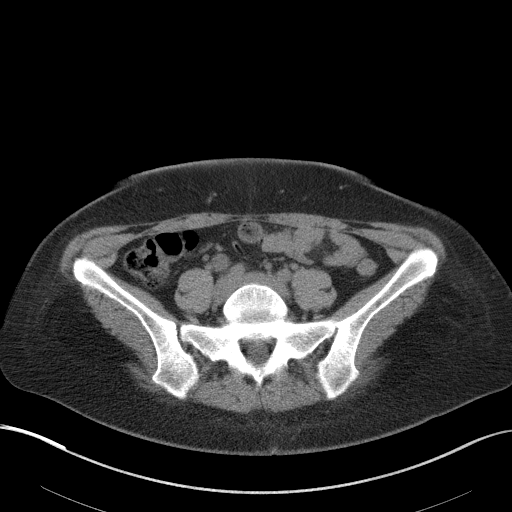
[im 45/87  soft-tissue]
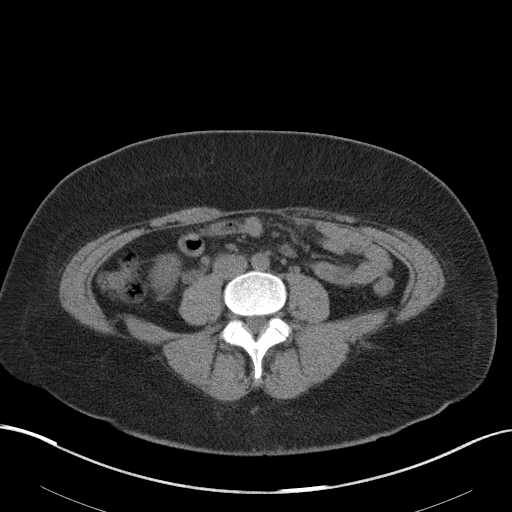
[im 50/87  soft-tissue]
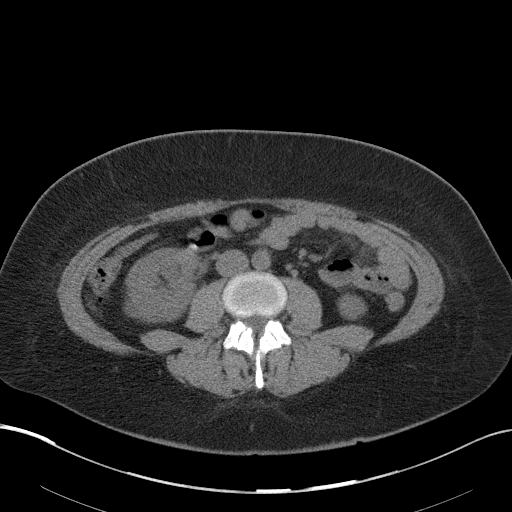
[im 56/87  soft-tissue]
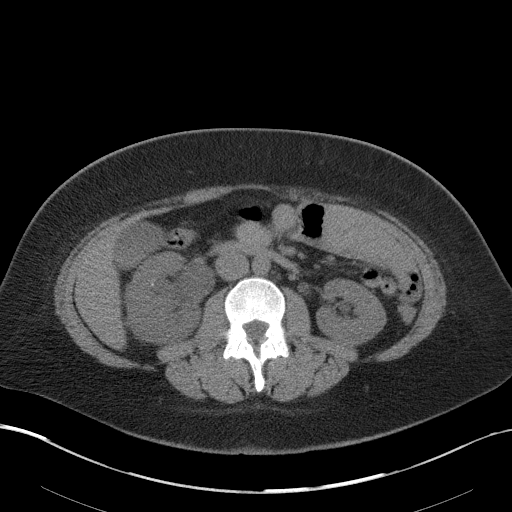
[im 56/87  bone]
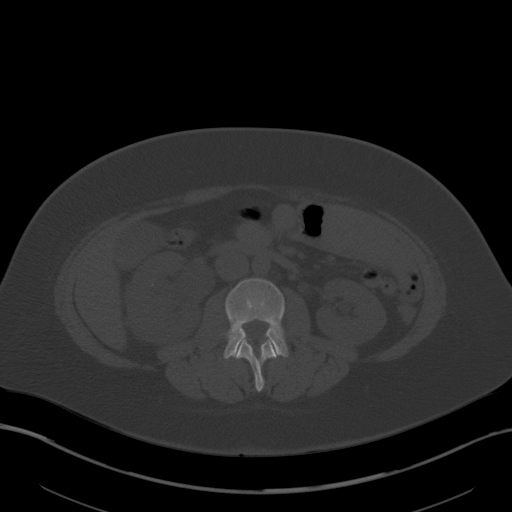
[im 62/87  soft-tissue]
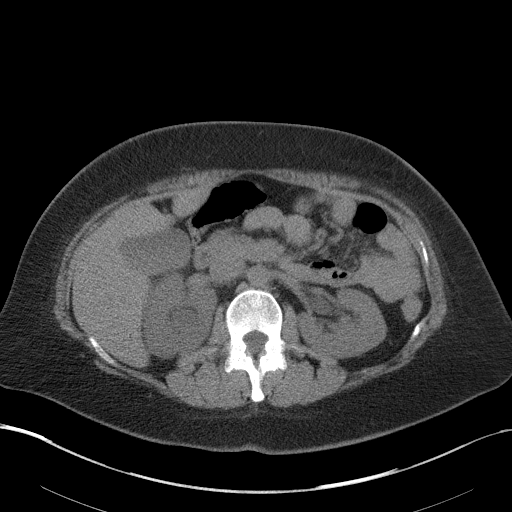
[im 70/87  soft-tissue]
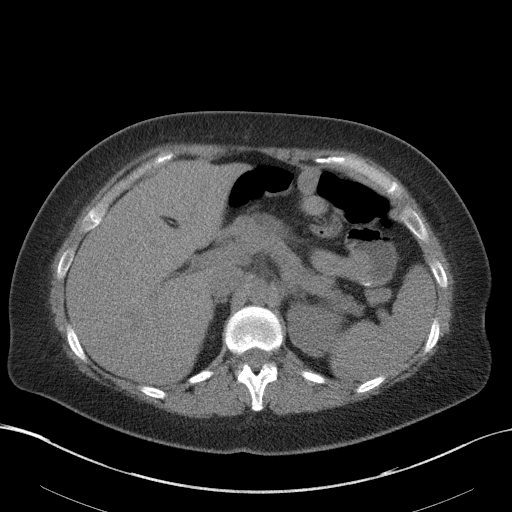
[im 75/87  soft-tissue]
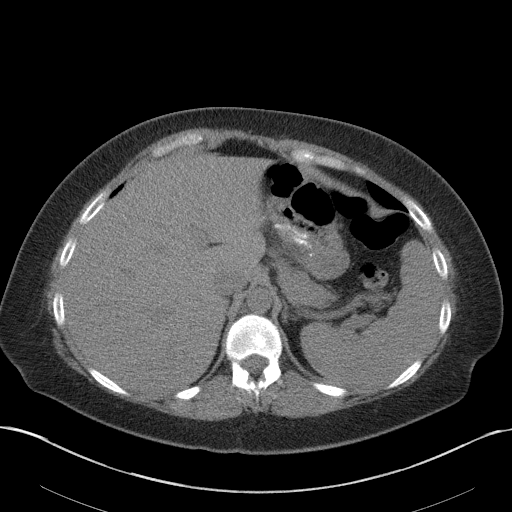
[im 81/87  soft-tissue]
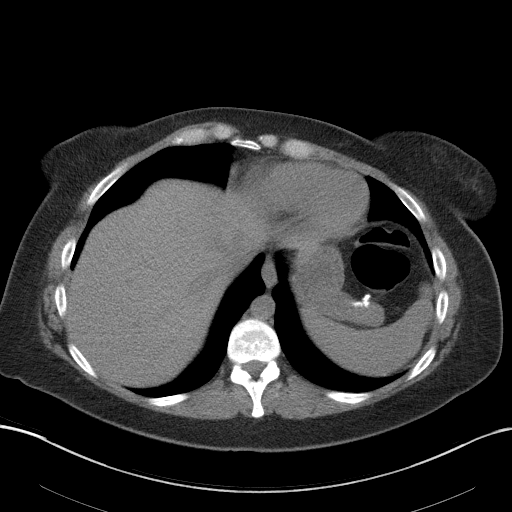

[Series 7: renal stone 3.0 coronal · coronal · 0.83mm/px · 3 of 78 slices shown]
[im 26/78  soft-tissue]
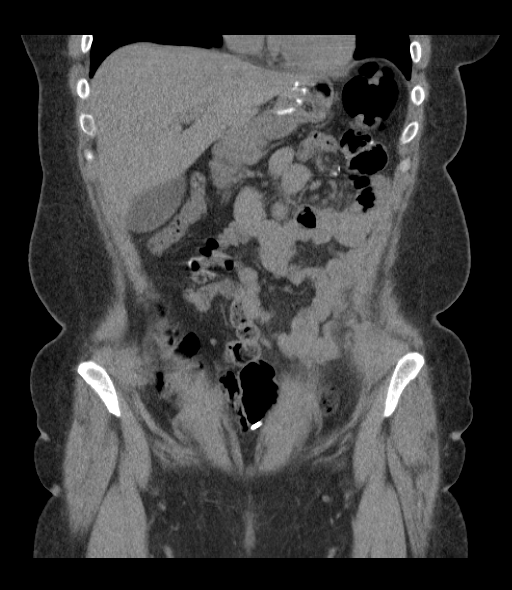
[im 35/78  soft-tissue]
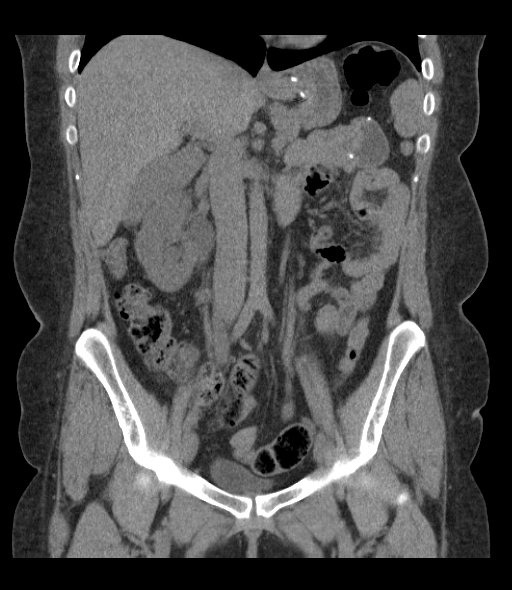
[im 43/78  soft-tissue]
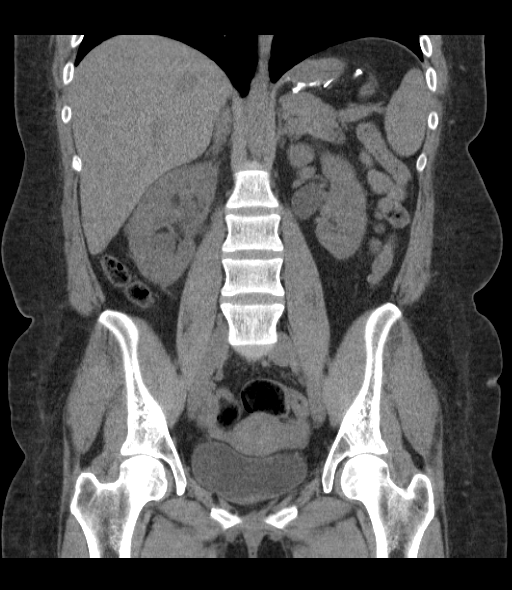

[16 of 46 positions shown; findings below may reference images not displayed]

FINDINGS: No focal abnormality is seen in the liver or spleen on this study
performed without intravenous contrast material. Patient is status
post gastric bypass surgery. The duodenum, pancreas, and adrenal
glands are unremarkable. Layering sludge is identified in the
gallbladder.

No left renal stones.  No left ureteral stones.

There is mild right hydronephrosis. With 1-2 mm nonobstructing stone
is identified in the interpolar right kidney. Right ureter is mildly
distended with perienteric edema down to the level of the right UVJ
the where a 4 x 3 x 4 mm stone is identified. No stones are seen in
the urinary bladder.

No abdominal aortic aneurysm. No free fluid or lymphadenopathy in
the abdomen.

Imaging through the pelvis shows no free intraperitoneal fluid.
There is no pelvic sidewall lymphadenopathy. Uterus is unremarkable.
No adnexal mass. No substantial diverticular change in the colon. No
colonic diverticulitis. The terminal ileum is normal. The appendix
is normal.

Bone windows reveal no worrisome lytic or sclerotic osseous lesions.
IMPRESSION: 4 mm right UVJ stone causes mild right hydroureteronephrosis.

1-2 mm nonobstructing stone in the interpolar right kidney. No
evidence for left urinary stones.

## 2013-07-18 MED ORDER — PHENAZOPYRIDINE HCL 200 MG PO TABS: 200.0000 mg | ORAL_TABLET | Freq: Three times a day (TID) | ORAL | Status: DC | PRN

## 2013-07-18 MED ORDER — HYDROCODONE-ACETAMINOPHEN 5-325 MG PO TABS: 1.0000 | ORAL_TABLET | Freq: Four times a day (QID) | ORAL | Status: DC | PRN

## 2013-07-18 MED ORDER — SODIUM CHLORIDE 0.9 % IV BOLUS (SEPSIS)
1000.0000 mL | Freq: Once | INTRAVENOUS | Status: AC
  Administered 2013-07-18: 1000 mL via INTRAVENOUS

## 2013-07-18 MED ORDER — ONDANSETRON HCL 4 MG/2ML IJ SOLN
4.0000 mg | Freq: Once | INTRAMUSCULAR | Status: AC
  Administered 2013-07-18: 4 mg via INTRAVENOUS
  Filled 2013-07-18: qty 2

## 2013-07-18 MED ORDER — MORPHINE SULFATE 4 MG/ML IJ SOLN
6.0000 mg | Freq: Once | INTRAMUSCULAR | Status: AC
  Administered 2013-07-18: 6 mg via INTRAVENOUS
  Filled 2013-07-18: qty 2

## 2013-07-18 MED ORDER — PHENAZOPYRIDINE HCL 100 MG PO TABS
95.0000 mg | ORAL_TABLET | Freq: Once | ORAL | Status: AC
  Administered 2013-07-18: 100 mg via ORAL
  Filled 2013-07-18: qty 1

## 2013-07-18 NOTE — ED Notes (Signed)
Right flank pain since 5am. States she is not able to urinate.

## 2013-07-18 NOTE — Discharge Instructions (Signed)
Kidney Stones  Kidney stones (urolithiasis) are deposits that form inside your kidneys. The intense pain is caused by the stone moving through the urinary tract. When the stone moves, the ureter goes into spasm around the stone. The stone is usually passed in the urine.   CAUSES   · A disorder that makes certain neck glands produce too much parathyroid hormone (primary hyperparathyroidism).  · A buildup of uric acid crystals, similar to gout in your joints.  · Narrowing (stricture) of the ureter.  · A kidney obstruction present at birth (congenital obstruction).  · Previous surgery on the kidney or ureters.  · Numerous kidney infections.  SYMPTOMS   · Feeling sick to your stomach (nauseous).  · Throwing up (vomiting).  · Blood in the urine (hematuria).  · Pain that usually spreads (radiates) to the groin.  · Frequency or urgency of urination.  DIAGNOSIS   · Taking a history and physical exam.  · Blood or urine tests.  · CT scan.  · Occasionally, an examination of the inside of the urinary bladder (cystoscopy) is performed.  TREATMENT   · Observation.  · Increasing your fluid intake.  · Extracorporeal shock wave lithotripsy This is a noninvasive procedure that uses shock waves to break up kidney stones.  · Surgery may be needed if you have severe pain or persistent obstruction. There are various surgical procedures. Most of the procedures are performed with the use of small instruments. Only small incisions are needed to accommodate these instruments, so recovery time is minimized.  The size, location, and chemical composition are all important variables that will determine the proper choice of action for you. Talk to your health care provider to better understand your situation so that you will minimize the risk of injury to yourself and your kidney.   HOME CARE INSTRUCTIONS   · Drink enough water and fluids to keep your urine clear or pale yellow. This will help you to pass the stone or stone fragments.  · Strain  all urine through the provided strainer. Keep all particulate matter and stones for your health care provider to see. The stone causing the pain may be as small as a grain of salt. It is very important to use the strainer each and every time you pass your urine. The collection of your stone will allow your health care provider to analyze it and verify that a stone has actually passed. The stone analysis will often identify what you can do to reduce the incidence of recurrences.  · Only take over-the-counter or prescription medicines for pain, discomfort, or fever as directed by your health care provider.  · Make a follow-up appointment with your health care provider as directed.  · Get follow-up X-rays if required. The absence of pain does not always mean that the stone has passed. It may have only stopped moving. If the urine remains completely obstructed, it can cause loss of kidney function or even complete destruction of the kidney. It is your responsibility to make sure X-rays and follow-ups are completed. Ultrasounds of the kidney can show blockages and the status of the kidney. Ultrasounds are not associated with any radiation and can be performed easily in a matter of minutes.  SEEK MEDICAL CARE IF:  · You experience pain that is progressive and unresponsive to any pain medicine you have been prescribed.  SEEK IMMEDIATE MEDICAL CARE IF:   · Pain cannot be controlled with the prescribed medicine.  · You have a fever   or shaking chills.  · The severity or intensity of pain increases over 18 hours and is not relieved by pain medicine.  · You develop a new onset of abdominal pain.  · You feel faint or pass out.  · You are unable to urinate.  MAKE SURE YOU:   · Understand these instructions.  · Will watch your condition.  · Will get help right away if you are not doing well or get worse.  Document Released: 06/02/2005 Document Revised: 02/02/2013 Document Reviewed: 11/03/2012  ExitCare® Patient Information ©2014  [employer].

## 2013-07-18 NOTE — ED Provider Notes (Signed)
CSN: [id number]     Arrival date & time 07/18/13  1744 History  This chart was scribed for Dagmar Hait, MD by Smiley Houseman, ED Scribe. The patient was seen in room MH06/MH06. Patient's care was started at 6:18PM.   Chief Complaint  Patient presents with  . Flank Pain   Patient is a 28 y.o. female presenting with flank pain. The history is provided by the patient. No language interpreter was used.  Flank Pain This is a new problem. The current episode started 12 to 24 hours ago. The problem occurs constantly. The problem has been gradually worsening. Pertinent negatives include no chest pain, no abdominal pain and no shortness of breath. Nothing aggravates the symptoms. Nothing relieves the symptoms. She has tried nothing for the symptoms.   HPI Comments: [name] [name] is a 28 y.o. female who presents to the Emergency Department complaining of right flank pain that started about 13 hours ago.  Pt states that she is able to urinate, but she can't empty her bladder.  Pt reports that she is very nauseated, but denies emesis and fever.  Pt states that she thought she was having cramps, because she is currently on her period.  She states that she has a h/o of kidney stones and recognizes the similarity in the symptoms.  Pt state that her stones are normally 8-22mm, but in November they found a 3mm stone that she doesn't recall passing.  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   Past Medical History  Diagnosis Date  . OBESITY   . DEPRESSION   . Anxiety   . Infertility, female hx   . Anemia     after 1st delivery  . GERD (gastroesophageal reflux disease)     pregnancy related- no meds  . PCOS (polycystic ovarian syndrome)   . Morbid obesity   . HYPOTHYROIDISM     hx of  . Headache(784.0)     hx of migraines  . Pregnancy induced hypertension     gestational  . Renal disorder    Past Surgical History  Procedure Laterality  Date  . Cesarean section      x2  . Tonsillectomy    . Wisdom tooth extraction    . Tubal ligation    . Lithotripsy Left   . 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 Upper gi endoscopy  08/24/2012    Procedure: UPPER GI ENDOSCOPY;  Surgeon: Atilano Ina, MD;  Location: [facility];  Service: General;;   Family History  Problem Relation Age of Onset  . Diabetes Other   . Hyperlipidemia Other   . Hypertension Other   . Hypertension Mother   . Hyperlipidemia Father   . Hypertension Father   . Kidney disease Father   . Cancer Father   . Hypertension Maternal Grandmother   . Diabetes Maternal Grandfather    History  Substance Use Topics  . Smoking status: Never Smoker   . Smokeless tobacco: Never Used  . Alcohol Use: No     Comment: rare   OB History   Grav Para Term Preterm Abortions TAB SAB Ect Mult Living   2 2 2  0 0 0 0 0 0 2     Review of Systems  Constitutional: Negative for fever and  chills.  HENT: Negative for congestion and rhinorrhea.   Respiratory: Negative for cough and shortness of breath.   Cardiovascular: Negative for chest pain.  Gastrointestinal: Positive for nausea. Negative for vomiting, abdominal pain and diarrhea.  Genitourinary: Positive for flank pain and difficulty urinating.  Musculoskeletal: Negative for back pain.  Skin: Negative for color change and rash.  Neurological: Negative for syncope.  All other systems reviewed and are negative.    Allergies  Review of patient's allergies indicates no known allergies.  Home Medications   Current Outpatient Rx  Name  Route  Sig  Dispense  Refill  . Acetaminophen (TYLENOL PO)   Oral   Take 500 mg by mouth as needed.         . ALPRAZolam (XANAX) 0.5 MG tablet   Oral   Take 1 tablet (0.5 mg total) by mouth 2 (two) times daily as needed  for anxiety.   30 tablet   1   . Calcium Citrate-Vitamin D (CALCIUM CITRATE + D PO)   Oral   Take 1,200 mg by mouth daily.          . Cyanocobalamin (NASCOBAL) 500 MCG/0.1ML SOLN   Nasal   Place 0.1 mLs into the nose once a week. Take on mondays         . escitalopram (LEXAPRO) 10 MG tablet   Oral   Take 1 tablet (10 mg total) by mouth daily.   30 tablet   3   . IRON PO   Oral   Take 270 mg by mouth daily.          . Multiple Vitamin (MULTI-VITAMIN DAILY PO)   Oral   Take 1 tablet by mouth 2 (two) times daily.          . Naproxen Sodium 220 MG CAPS   Oral   Take 500 mg by mouth.         . ondansetron (ZOFRAN ODT) 4 MG disintegrating tablet   Oral   Take 1 tablet (4 mg total) by mouth every 8 (eight) hours as needed for nausea.   10 tablet   0   . venlafaxine (EFFEXOR) 37.5 MG tablet   Oral   Take 1 tablet (37.5 mg total) by mouth 2 (two) times daily.   60 tablet   5   . vitamin C (ASCORBIC ACID) 500 MG tablet   Oral   Take 500 mg by mouth daily.         Marland Kitchen. zolpidem (AMBIEN) 10 MG tablet      TAKE 1 TABLET BY MOUTH AT BEDTIME AS NEEDED FOR SLEEP   30 tablet   0    Triage Vitals: BP 142/98  Pulse 67  Temp(Src) 98 F (36.7 C) (Oral)  Resp 20  Ht 5\' 4"  (1.626 m)  Wt 185 lb (83.915 kg)  BMI 31.74 kg/m2  SpO2 100%  LMP 06/18/2013 Physical Exam  Nursing note and vitals reviewed. Constitutional: She is oriented to person, place, and time. She appears well-developed and well-nourished. No distress.  HENT:  Head: Normocephalic and atraumatic.  Eyes: Conjunctivae and EOM are normal. Right eye exhibits no discharge. Left eye exhibits no discharge.  Neck: Neck supple. No tracheal deviation present.  Cardiovascular: Normal rate, regular rhythm and normal heart sounds.  Exam reveals no gallop and no friction rub.   No murmur heard. Pulmonary/Chest: Effort normal and breath sounds normal. No respiratory distress. She has no wheezes. She has no rales.   Abdominal:  Soft. Normal appearance. She exhibits no distension and no mass. There is tenderness. There is CVA tenderness (mild right side). There is no rebound and no guarding.  Mild right sided flank pain  Musculoskeletal: Normal range of motion.  Neurological: She is alert and oriented to person, place, and time.  Skin: Skin is warm and dry.  Psychiatric: She has a normal mood and affect. Her behavior is normal.    ED Course  Procedures (including critical care time) DIAGNOSTIC STUDIES: Oxygen Saturation is 100% on RA, normal by my interpretation.    COORDINATION OF CARE: 6:23 PM-Will order CT of abdomen and pelvis. Will order basic labs.  Patient informed of current plan of treatment and evaluation and agrees with plan.    Labs Review Labs Reviewed  URINALYSIS, ROUTINE W REFLEX MICROSCOPIC - Abnormal; Notable for the following:    Hgb urine dipstick LARGE (*)    All other components within normal limits  CBC - Abnormal; Notable for the following:    RBC 3.86 (*)    Hemoglobin 10.9 (*)    HCT 32.5 (*)    All other components within normal limits  BASIC METABOLIC PANEL - Abnormal; Notable for the following:    GFR calc non Af Amer 75 (*)    GFR calc Af Amer 87 (*)    All other components within normal limits  PREGNANCY, URINE  URINE MICROSCOPIC-ADD ON   Imaging Review Ct Abdomen Pelvis Wo Contrast  07/18/2013   CLINICAL DATA:  Right flank pain. Unable to urinate. History of kidney stones.  EXAM: CT ABDOMEN AND PELVIS WITHOUT CONTRAST  TECHNIQUE: Multidetector CT imaging of the abdomen and pelvis was performed following the standard protocol without intravenous contrast.  COMPARISON:  05/07/2013  FINDINGS: No focal abnormality is seen in the liver or spleen on this study performed without intravenous contrast material. Patient is status post gastric bypass surgery. The duodenum, pancreas, and adrenal glands are unremarkable. Layering sludge is identified in the gallbladder.  No  left renal stones.  No left ureteral stones.  There is mild right hydronephrosis. With 1-2 mm nonobstructing stone is identified in the interpolar right kidney. Right ureter is mildly distended with perienteric edema down to the level of the right UVJ the where a 4 x 3 x 4 mm stone is identified. No stones are seen in the urinary bladder.  No abdominal aortic aneurysm. No free fluid or lymphadenopathy in the abdomen.  Imaging through the pelvis shows no free intraperitoneal fluid. There is no pelvic sidewall lymphadenopathy. Uterus is unremarkable. No adnexal mass. No substantial diverticular change in the colon. No colonic diverticulitis. The terminal ileum is normal. The appendix is normal.  Bone windows reveal no worrisome lytic or sclerotic osseous lesions.  IMPRESSION: 4 mm right UVJ stone causes mild right hydroureteronephrosis.  1-2 mm nonobstructing stone in the interpolar right kidney. No evidence for left urinary stones.   Electronically Signed   By: Kennith Center M.D.   On: 07/18/2013 18:44    MDM   1. Kidney stone    43F presents with R flank pain, concerning for kidney stone. Hx of large kidney stones requiring lithotripsy. Patient reports decreased urine output. Patient here with mild R CVA tenderness, vitals stable. UA without infection, creatinine normal. CT with R UPJ stone - 4 mm. Given pain meds, pyridium. Has Urology f/u tomorrow. Stable for discharge.    I personally performed the services described in this documentation, which was scribed in my presence. The  recorded information has been reviewed and is accurate.      Dagmar Hait, MD 07/18/13 [phone]

## 2013-07-18 NOTE — ED Notes (Signed)
Pt transported to ct scan before RN in to start iv and give meds will do upon pts return to room

## 2013-08-12 ENCOUNTER — Other Ambulatory Visit: Payer: Self-pay | Admitting: *Deleted

## 2013-08-12 MED ORDER — ZOLPIDEM TARTRATE 10 MG PO TABS: 10.0000 mg | ORAL_TABLET | Freq: Every evening | ORAL | Status: DC | PRN

## 2013-08-12 NOTE — Telephone Encounter (Signed)
Faxed script back to [place]...[facility] 

## 2013-08-22 ENCOUNTER — Encounter: Payer: 59 | Attending: General Surgery | Admitting: Dietician

## 2013-08-22 VITALS — Ht 63.0 in | Wt 184.0 lb

## 2013-08-22 DIAGNOSIS — E669 Obesity, unspecified: Secondary | ICD-10-CM

## 2013-08-22 DIAGNOSIS — Z01818 Encounter for other preprocedural examination: Secondary | ICD-10-CM | POA: Insufficient documentation

## 2013-08-22 DIAGNOSIS — Z713 Dietary counseling and surveillance: Secondary | ICD-10-CM | POA: Insufficient documentation

## 2013-08-22 NOTE — Patient Instructions (Signed)
Goals:  Follow Phase 3B: High Protein + Non-Starchy Vegetables  Continue having lean protein foods to meet 60-80g goal  Increase fluid intake to 64oz +  Add 15 grams of carbohydrate (fruit, whole grain) with meals  Avoid drinking 15 minutes before, during and 30 minutes after eating  Continue to get  >30 min of physical activity daily

## 2013-08-22 NOTE — Progress Notes (Signed)
Follow-up visit:  9 Months Post-Operative RYGB Surgery  Medical Nutrition Therapy:  Appt start time: 1230  End time:  100.  Primary concerns today: Post-operative Bariatric Surgery Nutrition Management. [name] [name] returns for f/u today. Lost about 11.5 lbs since the last visit. Training to run a 5k in 2 weeks.   Surgery date: 08/24/12  Surgery type: RYGB  Start weight at [facility]: 285.3 lbs (06/29/12)  Pre-Op Class weight: 286.5 lbs (08/05/12)  Weight today: 184.0 lbs  Weight change:11.5 lbs Total weight lost: 101.3 lbs (08/05/12)  Goat weight: 150 lbs % weight met: 67%  TANITA  BODY COMP RESULTS  09/07/12 10/19/12 11/16/12 02/22/13 05/24/13 08/22/13   BMI (kg/m^2) 46.9 43.8 41.6 36.4 34.6 32.6   Fat Mass (lbs) 147.0 132.0 121.5 96.0 86.0 78.0   Fat Free Mass (lbs) 118.0 115.0 113.5 116.0 109.5 106.0   Total Body Water (lbs) 86.5 84.0 83.0 85.0 80.0 77.5   24-hr recall: B (AM): 1 egg and yogurt and dannon light and fit (18 g) Snk (AM): 1/2 premier shake OR cheese cubes (6 g) L (PM):  2 slices turkey and cheese stick OR canned tuna with carrots/vegetable (21 g) Snk (PM):  4 oz 1% milk (4 g)  D (PM): 3 oz chicken or pork, green beans or other vegetables and beans with some rice (21 g) Snk (PM): fruit or milk   Fluid intake: 11 oz  Premier protein shake, water = 60-80 oz, 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   Nutritional Diagnosis:  [place]-3.3 Overweight/obesity related to past poor dietary habits and physical inactivity as evidenced by patient w/ recent RYGB surgery following dietary guidelines for continued weight loss.    Intervention:  Nutrition education.  Monitoring/Evaluation:  Dietary intake, exercise, and body weight. Follow up prn.   

## 2013-08-24 ENCOUNTER — Encounter: Payer: Self-pay | Admitting: Internal Medicine

## 2013-08-24 ENCOUNTER — Encounter: Payer: Self-pay | Admitting: *Deleted

## 2013-08-24 ENCOUNTER — Ambulatory Visit (INDEPENDENT_AMBULATORY_CARE_PROVIDER_SITE_OTHER): Payer: 59 | Admitting: Internal Medicine

## 2013-08-24 VITALS — BP 110/72 | HR 70 | Temp 98.4°F | Wt 187.2 lb

## 2013-08-24 DIAGNOSIS — E669 Obesity, unspecified: Secondary | ICD-10-CM

## 2013-08-24 DIAGNOSIS — F329 Major depressive disorder, single episode, unspecified: Secondary | ICD-10-CM

## 2013-08-24 DIAGNOSIS — F3289 Other specified depressive episodes: Secondary | ICD-10-CM

## 2013-08-24 DIAGNOSIS — N200 Calculus of kidney: Secondary | ICD-10-CM

## 2013-08-24 NOTE — Assessment & Plan Note (Signed)
Prev on SNRI (venlafaxine), but causing nausea since bariatric procedure 06/2012 Increasing anxiety, largely situational related to stressors of child raisingongoing counseling with EAP - will call if referral needed  Changed to Lexapro 06/2013 as patient's dad taking and doing well on same  symptoms improve with better sleep, less anxiety and bright spirits/outlook The current medical regimen is effective;  continue present plan and medications.  Also infreq use of low-dose Xanax prn for panic attack symptoms

## 2013-08-24 NOTE — Patient Instructions (Signed)
It was good to see you today.  We have reviewed your prior records including labs and tests today  Medications reviewed and updated, no changes recommended at this time.  Please schedule followup in 6-9 months for annual visit and labs, call sooner if problems.

## 2013-08-24 NOTE — Assessment & Plan Note (Signed)
Patient 1 year out from bariatric procedure with Roux-en-Y Weight trends reviewed Planning to run 5K late March 2015, encouragement provided for continued success Wt Readings from Last 3 Encounters:  08/24/13 187 lb 3.2 oz (84.913 kg)  08/22/13 184 lb (83.462 kg)  07/18/13 185 lb (83.915 kg)

## 2013-08-24 NOTE — Progress Notes (Signed)
Subjective:    Patient ID: [name] [name], female    DOB: [birth year], 28 y.o.   MRN: [id number]  HPI  Patient is here for follow up - depression/anxiety - doing well Also reviewed chronic medical issues and interval medical events  Past Medical History  Diagnosis Date  . OBESITY   . DEPRESSION   . Anxiety   . Infertility, female hx   . Anemia     after 1st delivery  . GERD (gastroesophageal reflux disease)     pregnancy related- no meds  . PCOS (polycystic ovarian syndrome)   . Morbid obesity   . HYPOTHYROIDISM     hx of  . Headache(784.0)     hx of migraines  . Pregnancy induced hypertension     gestational  . Renal disorder     Review of Systems  Neurological: Negative for dizziness and weakness.  Psychiatric/Behavioral: Negative for suicidal ideas, hallucinations, confusion, sleep disturbance, self-injury, dysphoric mood and decreased concentration. The patient is not nervous/anxious.        Objective:   Physical Exam  BP 110/72  Pulse 70  Temp(Src) 98.4 F (36.9 C) (Oral)  Wt 187 lb 3.2 oz (84.913 kg)  SpO2 99% Wt Readings from Last 3 Encounters:  08/24/13 187 lb 3.2 oz (84.913 kg)  08/22/13 184 lb (83.462 kg)  07/18/13 185 lb (83.915 kg)    Constitutional: She is obese, but appears well-developed and well-nourished. No distress. 2 children at side Neck: Normal range of motion. Neck supple. No JVD present. No thyromegaly present.  Cardiovascular: Normal rate, regular rhythm and normal heart sounds.  No murmur heard. No BLE edema. Pulmonary/Chest: Effort normal and breath sounds normal. No respiratory distress. She has no wheezes.  Psychiatric: She has a normal mood and affect. Her behavior is normal. Judgment and thought content normal.   Lab Results  Component Value Date   WBC 9.3 07/18/2013   HGB 10.9* 07/18/2013   HCT 32.5* 07/18/2013   PLT 228 07/18/2013   GLUCOSE 89 07/18/2013   CHOL 155 04/14/2013   TRIG 110 04/14/2013   HDL 49 04/14/2013   LDLDIRECT 140.3 04/03/2010   LDLCALC 84 04/14/2013   ALT 9 04/14/2013   AST 11 04/14/2013   NA 140 07/18/2013   K 4.3 07/18/2013   CL 103 07/18/2013   CREATININE 1.00 07/18/2013   BUN 19 07/18/2013   CO2 27 07/18/2013   TSH 2.067 06/23/2012    Ct Abdomen Pelvis Wo Contrast  07/18/2013   CLINICAL DATA:  Right flank pain. Unable to urinate. History of kidney stones.  EXAM: CT ABDOMEN AND PELVIS WITHOUT CONTRAST  TECHNIQUE: Multidetector CT imaging of the abdomen and pelvis was performed following the standard protocol without intravenous contrast.  COMPARISON:  05/07/2013  FINDINGS: No focal abnormality is seen in the liver or spleen on this study performed without intravenous contrast material. Patient is status post gastric bypass surgery. The duodenum, pancreas, and adrenal glands are unremarkable. Layering sludge is identified in the gallbladder.  No left renal stones.  No left ureteral stones.  There is mild right hydronephrosis. With 1-2 mm nonobstructing stone is identified in the interpolar right kidney. Right ureter is mildly distended with perienteric edema down to the level of the right UVJ the where a 4 x 3 x 4 mm stone is identified. No stones are seen in the urinary bladder.  No abdominal aortic aneurysm. No free fluid or lymphadenopathy in the abdomen.  Imaging through the pelvis shows  no free intraperitoneal fluid. There is no pelvic sidewall lymphadenopathy. Uterus is unremarkable. No adnexal mass. No substantial diverticular change in the colon. No colonic diverticulitis. The terminal ileum is normal. The appendix is normal.  Bone windows reveal no worrisome lytic or sclerotic osseous lesions.  IMPRESSION: 4 mm right UVJ stone causes mild right hydroureteronephrosis.  1-2 mm nonobstructing stone in the interpolar right kidney. No evidence for left urinary stones.   Electronically Signed   By: Kennith CenterEric  Mansell M.D.   On: 07/18/2013 18:44       Assessment & Plan:   Problem List Items Addressed This  Visit   DEPRESSION - Primary     Prev on SNRI (venlafaxine), but causing nausea since bariatric procedure 06/2012 Increasing anxiety, largely situational related to stressors of child raisingongoing counseling with EAP - will call if referral needed  Changed to Lexapro 06/2013 as patient's dad taking and doing well on same  symptoms improve with better sleep, less anxiety and bright spirits/out[place] The current medical regimen is effective;  continue present plan and medications.  Also infreq use of low-dose Xanax prn for panic attack symptoms    Kidney stone     07/2013 event reviewed - symptoms resolved with passing stone indep Used hydrocodone prn for same - no current symptoms     Obesity (BMI 30-39.9)      Patient 1 year out from bariatric procedure with Roux-en-Y Weight trends reviewed Planning to run 5K late March 2015, encouragement provided for continued success Wt Readings from Last 3 Encounters:  08/24/13 187 lb 3.2 oz (84.913 kg)  08/22/13 184 lb (83.462 kg)  07/18/13 185 lb (83.915 kg)         Time spent with pt/family today 25 minutes, greater than 50% time spent counseling patient on depression, kidney stone, weight loss and medication review. Also review of prior records

## 2013-08-24 NOTE — Progress Notes (Signed)
[name] [name] [id number] 08/24/2013   Chief Complaint  Patient presents with  . Follow-up    6 weeks    Subjective  HPI Comments: 6 week f/u for medication change: Unable to tolerate generic Effexor since gastric bypass surgery.  Now taking Lexapro without adverse reaction or complaint.   Hx of Kidney Stones - Taking Vicodin prn for pain.  Has passed 4mm stone viewed on CT on 07/18/2013 ED visit.    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  Goal wt. 175.  Joined gym - going very well.  Planned 5K in two weeks.     Past Medical History  Diagnosis Date  . OBESITY   . DEPRESSION   . Anxiety   . Infertility, female hx   . Anemia     after 1st delivery  . GERD (gastroesophageal reflux disease)     pregnancy related- no meds  . PCOS (polycystic ovarian syndrome)   . Morbid obesity   . HYPOTHYROIDISM     hx of  . Headache(784.0)     hx of migraines  . Pregnancy induced hypertension     gestational  . Renal disorder     Past Surgical History  Procedure Laterality Date  . Cesarean section      x2  . Tonsillectomy    . Wisdom tooth extraction    . Tubal ligation    . Lithotripsy Left   . 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 Upper gi endoscopy  08/24/2012    Procedure: UPPER GI ENDOSCOPY;  Surgeon: Atilano Ina, MD;  Location: [facility];  Service: General;;    Family History  Problem Relation Age of Onset  . Diabetes Other   . Hyperlipidemia Other   . Hypertension Other   . Hypertension Mother   . Hyperlipidemia Father   . Hypertension Father   . Kidney disease Father   . Cancer Father   . Hypertension Maternal Grandmother   . Diabetes Maternal Grandfather     History  Substance Use Topics  . Smoking status: Never Smoker   . Smokeless tobacco: Never Used  . Alcohol Use: No     Comment:  rare    Current Outpatient Prescriptions on File Prior to Visit  Medication Sig Dispense Refill  . Acetaminophen (TYLENOL PO) Take 500 mg by mouth as needed.      . ALPRAZolam (XANAX) 0.5 MG tablet Take 1 tablet (0.5 mg total) by mouth 2 (two) times daily as needed for anxiety.  30 tablet  1  . Calcium Citrate-Vitamin D (CALCIUM CITRATE + D PO) Take 1,200 mg by mouth daily.       . Cyanocobalamin (NASCOBAL) 500 MCG/0.1ML SOLN Place 0.1 mLs into the nose once a week. Take on mondays      . escitalopram (LEXAPRO) 10 MG tablet Take 1 tablet (10 mg total) by mouth daily.  30 tablet  3  . HYDROcodone-acetaminophen (NORCO/VICODIN) 5-325 MG per tablet Take 1 tablet by mouth every 6 (six) hours as needed for moderate pain.  20 tablet  0  . IRON PO Take 270 mg by mouth daily.       . Multiple Vitamin (MULTI-VITAMIN DAILY PO) Take 1 tablet by mouth 2 (two) times daily.       . Naproxen Sodium 220 MG CAPS Take 500 mg by  mouth.      . ondansetron (ZOFRAN ODT) 4 MG disintegrating tablet Take 1 tablet (4 mg total) by mouth every 8 (eight) hours as needed for nausea.  10 tablet  0  . phenazopyridine (PYRIDIUM) 200 MG tablet Take 1 tablet (200 mg total) by mouth 3 (three) times daily as needed for pain.  5 tablet  0  . vitamin C (ASCORBIC ACID) 500 MG tablet Take 500 mg by mouth daily.      Marland Kitchen zolpidem (AMBIEN) 10 MG tablet Take 1 tablet (10 mg total) by mouth at bedtime as needed for sleep.  30 tablet  5   No current facility-administered medications on file prior to visit.    Allergies: No Known Allergies  Review of Systems  Constitutional: Negative for fever, chills and malaise/fatigue.  HENT: Negative for hearing loss.   Eyes: Negative for blurred vision and double vision.  Gastrointestinal: Negative for nausea, vomiting, diarrhea and constipation.  Genitourinary: Negative for dysuria and hematuria.  Neurological: Negative for headaches.  Psychiatric/Behavioral: Negative for depression and suicidal  ideas.       Objective  Filed Vitals:   08/24/13 1001  BP: 110/72  Pulse: 70  Temp: 98.4 F (36.9 C)  TempSrc: Oral  Weight: 187 lb 3.2 oz (84.913 kg)  SpO2: 99%    Physical Exam  Constitutional: She is oriented to person, place, and time. She appears well-developed and well-nourished.  HENT:  Head: Normocephalic and atraumatic.  Eyes: Conjunctivae are normal. Pupils are equal, round, and reactive to light.  Neck: Normal range of motion. Neck supple.  Cardiovascular: Normal rate and normal heart sounds.   Respiratory: No respiratory distress. She has no wheezes.  GI: Soft. Bowel sounds are normal. She exhibits no distension. There is no tenderness. There is no rebound.  Musculoskeletal: Normal range of motion.  Neurological: She is alert and oriented to person, place, and time.    BP Readings from Last 3 Encounters:  08/24/13 110/72  07/18/13 127/82  07/13/13 124/80    Wt Readings from Last 3 Encounters:  08/24/13 187 lb 3.2 oz (84.913 kg)  08/22/13 184 lb (83.462 kg)  07/18/13 185 lb (83.915 kg)    Lab Results  Component Value Date   WBC 9.3 07/18/2013   HGB 10.9* 07/18/2013   HCT 32.5* 07/18/2013   PLT 228 07/18/2013   GLUCOSE 89 07/18/2013   CHOL 155 04/14/2013   TRIG 110 04/14/2013   HDL 49 04/14/2013   LDLDIRECT 140.3 04/03/2010   LDLCALC 84 04/14/2013   ALT 9 04/14/2013   AST 11 04/14/2013   NA 140 07/18/2013   K 4.3 07/18/2013   CL 103 07/18/2013   CREATININE 1.00 07/18/2013   BUN 19 07/18/2013   CO2 27 07/18/2013   TSH 2.067 06/23/2012    Ct Abdomen Pelvis Wo Contrast  07/18/2013   CLINICAL DATA:  Right flank pain. Unable to urinate. History of kidney stones.  EXAM: CT ABDOMEN AND PELVIS WITHOUT CONTRAST  TECHNIQUE: Multidetector CT imaging of the abdomen and pelvis was performed following the standard protocol without intravenous contrast.  COMPARISON:  05/07/2013  FINDINGS: No focal abnormality is seen in the liver or spleen on this study performed without  intravenous contrast material. Patient is status post gastric bypass surgery. The duodenum, pancreas, and adrenal glands are unremarkable. Layering sludge is identified in the gallbladder.  No left renal stones.  No left ureteral stones.  There is mild right hydronephrosis. With 1-2 mm nonobstructing stone is identified  in the interpolar right kidney. Right ureter is mildly distended with perienteric edema down to the level of the right UVJ the where a 4 x 3 x 4 mm stone is identified. No stones are seen in the urinary bladder.  No abdominal aortic aneurysm. No free fluid or lymphadenopathy in the abdomen.  Imaging through the pelvis shows no free intraperitoneal fluid. There is no pelvic sidewall lymphadenopathy. Uterus is unremarkable. No adnexal mass. No substantial diverticular change in the colon. No colonic diverticulitis. The terminal ileum is normal. The appendix is normal.  Bone windows reveal no worrisome lytic or sclerotic osseous lesions.  IMPRESSION: 4 mm right UVJ stone causes mild right hydroureteronephrosis.  1-2 mm nonobstructing stone in the interpolar right kidney. No evidence for left urinary stones.   Electronically Signed   By: Kennith CenterEric  Mansell M.D.   On: 07/18/2013 18:44     Assessment and Plan    Depression: 6 week f/u for medication change: Previously on Effexor.  Unable to tolerate (nausea after bariatric surgery).  Now taking Lexapro without adverse reaction or complaint.  Also occassionally taking Xanax prn for panic attack symptoms.  Call for any adverse reactions.  Hx of Kidney Stones - Was taking Vicodin prn for pain.  Has passed 4mm stone viewed on CT on 07/18/2013 ED visit.  Feeling since without symptoms.  Call and/or follow up with urology for return of symptoms.  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  Goal wt. 175.  Joined gym - going very well.  Planned 5K in two weeks.  Encouraged continuation of plan and congratulated her for her progress.  Patient was  instructed to notify the office if any new symptoms emerged or the current symptoms become worse.   Return in about 8 months (around 04/26/2014) for annual exam and labs.  Evie LacksMartensen, Henry C, Student-PA    I have personally reviewed this case with PA student. I also personally examined this patient. I agree with history and findings as documented above. I reviewed, discussed and approve of the assessment and plan as listed above. Rene PaciValerie Leschber, MD

## 2013-08-24 NOTE — Progress Notes (Signed)
Pre visit review using our clinic review tool, if applicable. No additional management support is needed unless otherwise documented below in the visit note. 

## 2013-08-24 NOTE — Assessment & Plan Note (Signed)
07/2013 event reviewed - symptoms resolved with passing stone indep Used hydrocodone prn for same - no current symptoms

## 2013-09-20 ENCOUNTER — Encounter: Payer: Self-pay | Admitting: Internal Medicine

## 2013-09-20 ENCOUNTER — Ambulatory Visit (INDEPENDENT_AMBULATORY_CARE_PROVIDER_SITE_OTHER): Payer: 59 | Admitting: Internal Medicine

## 2013-09-20 VITALS — BP 120/70 | HR 83 | Temp 97.2°F | Resp 13 | Wt 184.0 lb

## 2013-09-20 DIAGNOSIS — J019 Acute sinusitis, unspecified: Secondary | ICD-10-CM

## 2013-09-20 MED ORDER — AMOXICILLIN-POT CLAVULANATE 875-125 MG PO TABS: 1.0000 | ORAL_TABLET | Freq: Two times a day (BID) | ORAL | Status: DC

## 2013-09-20 NOTE — Progress Notes (Signed)
   Subjective:    Patient ID: [name] [name], female    DOB: 18-Dec-1985, 28 y.o.   MRN: [id number]  HPI  Symptoms began 2 weeks ago as rhinitis, head congestion, chest congestion, and nonproductive cough. Other symptoms include itchy, watery eyes, and sneezing. 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  She used nonsteroidal agents, Tylenol, Robitussin, and started a leftover antibiotic. There was some decreased in the sinus pain and improvement in the rhinitis.  At this time she describes a frontal headache, facial pain, dental pain, and discolored nasal secretions. The cough is now dry. She continues to have extrinsic symptoms of itchy, watery eyes, and sneezing. She does have some mild myalgias and joint pain.  She did take the flu shot. She's never smoked.      Review of Systems  She does not have a sore throat, otic discharge,sputum, wheezing, or shortness of breath.  She has developed a small cold sore which itches and is painful over the left lip     Objective:   Physical Exam General appearance:good health ;well nourished; no acute distress or increased work of breathing is present.  No  lymphadenopathy about the head, neck, or axilla noted.   Eyes: No conjunctival inflammation or lid edema is present.  Ears:  External ear exam shows no significant lesions or deformities.  Otoscopic examination reveals clear canals, tympanic membranes are intact bilaterally without bulging, retraction, inflammation or discharge.  Nose:  External nasal examination shows no deformity or inflammation. Nasal mucosa are dry without lesions or exudates. No septal dislocation or deviation.No obstruction to airflow.   Oral exam: Dental hygiene is good; lips and gums are healthy appearing.There is no oropharyngeal erythema or exudate noted.   Neck:  No deformities, thyromegaly, masses, or tenderness noted.   Supple with  full range of motion without pain.   Heart:  Normal rate and regular rhythm. S1 and S2 normal without gallop, murmur, click, rub or other extra sounds.   Lungs:Chest clear to auscultation; no wheezes, rhonchi,rales ,or rubs present.No increased work of breathing.    Extremities:  No cyanosis, edema, or clubbing  noted    Skin: Warm & dry          Assessment & Plan:  #1 rhinosinusitis without significant bronchitis  Plan: Nasal hygiene interventions discussed. See prescription medications

## 2013-09-20 NOTE — Patient Instructions (Signed)

## 2013-09-20 NOTE — Progress Notes (Signed)
Pre visit review using our clinic review tool, if applicable. No additional management support is needed unless otherwise documented below in the visit note. 

## 2013-09-21 ENCOUNTER — Encounter: Payer: Self-pay | Admitting: Internal Medicine

## 2013-11-11 ENCOUNTER — Other Ambulatory Visit: Payer: Self-pay | Admitting: Internal Medicine

## 2013-11-11 NOTE — Telephone Encounter (Signed)
Done hardcopy to robin  

## 2013-11-11 NOTE — Telephone Encounter (Signed)
Faxed hardcopy to  Pharmacy 

## 2014-03-06 ENCOUNTER — Other Ambulatory Visit: Payer: Self-pay | Admitting: Internal Medicine

## 2014-03-06 ENCOUNTER — Telehealth: Payer: Self-pay

## 2014-03-06 NOTE — Telephone Encounter (Signed)
Faxed to pharmacy

## 2014-03-21 ENCOUNTER — Other Ambulatory Visit: Payer: Self-pay

## 2014-03-21 MED ORDER — ALPRAZOLAM 0.5 MG PO TABS: ORAL_TABLET | ORAL | Status: DC

## 2014-04-14 ENCOUNTER — Ambulatory Visit (INDEPENDENT_AMBULATORY_CARE_PROVIDER_SITE_OTHER): Payer: 59 | Admitting: Family

## 2014-04-14 ENCOUNTER — Encounter: Payer: Self-pay | Admitting: Family

## 2014-04-14 VITALS — BP 150/92 | HR 96 | Temp 98.4°F | Resp 18 | Ht 63.5 in | Wt 204.8 lb

## 2014-04-14 DIAGNOSIS — F32A Depression, unspecified: Secondary | ICD-10-CM

## 2014-04-14 DIAGNOSIS — F329 Major depressive disorder, single episode, unspecified: Secondary | ICD-10-CM

## 2014-04-14 MED ORDER — DULOXETINE HCL 30 MG PO CPEP: 30.0000 mg | ORAL_CAPSULE | Freq: Every day | ORAL | Status: DC

## 2014-04-14 MED ORDER — TRAZODONE HCL 50 MG PO TABS: 25.0000 mg | ORAL_TABLET | Freq: Every evening | ORAL | Status: DC | PRN

## 2014-04-14 NOTE — Progress Notes (Signed)
Pre visit review using our clinic review tool, if applicable. No additional management support is needed unless otherwise documented below in the visit note. 

## 2014-04-14 NOTE — Patient Instructions (Signed)
Thank you for choosing [facility].  Summary/Instructions:   Please start taking your new medications  Please start with 1 tablet daily for 1 week and then increase 2 tablets daily  If you have increased thoughts of suicide or depression please seek medical help immediately.   Please plan to follow up in a month.

## 2014-04-14 NOTE — Assessment & Plan Note (Signed)
Currently experiencing increased symptoms after personal discontinuation of Lexapro because of the side effects of weight gain and decreased Libido. Discussed options and will start Cymbalta 30 mg capsules for 1 week and then increase to 2 capsules daily. Advised to seek emergency care if symptoms of depression worsen or increased thoughts of suicide. Also changed Ambien to trazodone per patient's request. Follow up in a month or sooner.

## 2014-04-14 NOTE — Progress Notes (Signed)
Subjective:    Patient ID: [name] DixonSamantha D Champeau, female    DOB: July 27, 1985, 28 y.o.   MRN: [id number]  Chief Complaint  Patient presents with  . Medication Problem    having side affects to antidepressant and would like it changed, weight gain and no sex drive    HPI:  [name] DixonSamantha D Delconte is a 28 y.o. female who presents today for a medication follow up.  1) Depression/Anxiety - grandmother died in March and father died in June; Has been maintained on the Lexapro since January of this year and has previously been on Wellbutrin. Currently experiencing weight gain and decreased libido.  Would like to discuss other medication. Uses Ambien to help fall asleep. Has not taken the Lexapro in over a week because she felt it was not working. Has noticed increase in anxiety, but denies any withdrawal effects.  Wt Readings from Last 3 Encounters:  04/14/14 204 lb 12.8 oz (92.897 kg)  09/20/13 184 lb (83.462 kg)  08/24/13 187 lb 3.2 oz (84.913 kg)   No Known Allergies  Current Outpatient Prescriptions on File Prior to Visit  Medication Sig Dispense Refill  . Acetaminophen (TYLENOL PO) Take 500 mg by mouth as needed.      . ALPRAZolam (XANAX) 0.5 MG tablet TAKE 1 TABLET BY MOUTH TWICE DAILY AS NEEDED ANXIETY  30 tablet  5  . Calcium Citrate-Vitamin D (CALCIUM CITRATE + D PO) Take 1,200 mg by mouth daily.       . Cyanocobalamin (NASCOBAL) 500 MCG/0.1ML SOLN Place 0.1 mLs into the nose once a week. Take on mondays      . escitalopram (LEXAPRO) 10 MG tablet TAKE 1 TABLET (10 MG TOTAL) BY MOUTH DAILY.  30 tablet  3  . HYDROcodone-acetaminophen (NORCO/VICODIN) 5-325 MG per tablet Take 1 tablet by mouth every 6 (six) hours as needed for moderate pain.  20 tablet  0  . IRON PO Take 270 mg by mouth daily.       . Multiple Vitamin (MULTI-VITAMIN DAILY PO) Take 1 tablet by mouth 2 (two) times daily.       . Naproxen Sodium 220 MG CAPS Take 500 mg by mouth.      . ondansetron (ZOFRAN ODT) 4 MG  disintegrating tablet Take 1 tablet (4 mg total) by mouth every 8 (eight) hours as needed for nausea.  10 tablet  0  . vitamin C (ASCORBIC ACID) 500 MG tablet Take 500 mg by mouth daily.      Marland Kitchen. amoxicillin-clavulanate (AUGMENTIN) 875-125 MG per tablet Take 1 tablet by mouth every 12 (twelve) hours. Take with meal  20 tablet  0  . phenazopyridine (PYRIDIUM) 200 MG tablet Take 1 tablet (200 mg total) by mouth 3 (three) times daily as needed for pain.  5 tablet  0   No current facility-administered medications on file prior to visit.    Review of Systems    See HPI  Objective:    BP 150/92  Pulse 96  Temp(Src) 98.4 F (36.9 C) (Oral)  Resp 18  Ht 5' 3.5" (1.613 m)  Wt 204 lb 12.8 oz (92.897 kg)  BMI 35.71 kg/m2  SpO2 97% Nursing note and vital signs reviewed.  Physical Exam  Constitutional: She is oriented to person, place, and time. She appears well-developed and well-nourished. No distress.  Cardiovascular: Normal rate, regular rhythm and normal heart sounds.   Pulmonary/Chest: Effort normal and breath sounds normal.  Neurological: She is alert and oriented to person, place,  and time.  Skin: Skin is warm and dry.  Psychiatric: She has a normal mood and affect. Her behavior is normal. Judgment and thought content normal.       Assessment & Plan:

## 2014-04-17 ENCOUNTER — Encounter: Payer: Self-pay | Admitting: Family

## 2014-05-08 ENCOUNTER — Ambulatory Visit (INDEPENDENT_AMBULATORY_CARE_PROVIDER_SITE_OTHER): Payer: 59 | Admitting: Family

## 2014-05-08 ENCOUNTER — Encounter: Payer: Self-pay | Admitting: Family

## 2014-05-08 VITALS — BP 160/88 | HR 76 | Temp 98.0°F | Resp 18 | Ht 63.0 in | Wt 204.8 lb

## 2014-05-08 DIAGNOSIS — F32A Depression, unspecified: Secondary | ICD-10-CM

## 2014-05-08 DIAGNOSIS — F329 Major depressive disorder, single episode, unspecified: Secondary | ICD-10-CM

## 2014-05-08 MED ORDER — DULOXETINE HCL 60 MG PO CPEP: 60.0000 mg | ORAL_CAPSULE | Freq: Every day | ORAL | Status: DC

## 2014-05-08 NOTE — Assessment & Plan Note (Signed)
Depression is stable with Cymbalta 60 mg and Xanax. Describes good improvements. Continue Cymbalta at current levels and Xanax and Trazadone as needed. Follow up in 3 months.

## 2014-05-08 NOTE — Patient Instructions (Addendum)
Thank you for choosing [facility].  Summary/Instructions:  Your prescription(s) have been submitted to your pharmacy. Please take as directed and contact our office if you believe you are having problem(s) with the medication(s).  Please continue to take the medication as prescribed. Plan to follow up in about 3 months.   Have a great holiday!

## 2014-05-08 NOTE — Progress Notes (Signed)
Subjective:    Patient ID: [name] DixonSamantha D Shill, female    DOB: 12/04/1985, 28 y.o.   MRN: [id number]  Chief Complaint  Patient presents with  . Follow-up    Has been doing alot better since the change in her depression medication    HPI:  [name] [name] is a 28 y.o. female who presents today to follow up for depression.  Recently started on Cymbalta for the depression and anxiety and trazadone as needed for sleep. Denies any adverse effects, overall she feels that the Cymbalta gives her energy and she is doing a lot better and the trazadone is helping with sleep. Also continues to take the Xanax as needed. Pt describes that she is at a good level right now and doesn't believe that she needs to make any changes.   No Known Allergies  Current Outpatient Prescriptions on File Prior to Visit  Medication Sig Dispense Refill  . Acetaminophen (TYLENOL PO) Take 500 mg by mouth as needed.    . ALPRAZolam (XANAX) 0.5 MG tablet TAKE 1 TABLET BY MOUTH TWICE DAILY AS NEEDED ANXIETY 30 tablet 5  . Calcium Citrate-Vitamin D (CALCIUM CITRATE + D PO) Take 1,200 mg by mouth daily.     . Cyanocobalamin (NASCOBAL) 500 MCG/0.1ML SOLN Place 0.1 mLs into the nose once a week. Take on mondays    . HYDROcodone-acetaminophen (NORCO/VICODIN) 5-325 MG per tablet Take 1 tablet by mouth every 6 (six) hours as needed for moderate pain. 20 tablet 0  . IRON PO Take 270 mg by mouth daily.     . Multiple Vitamin (MULTI-VITAMIN DAILY PO) Take 1 tablet by mouth 2 (two) times daily.     . Naproxen Sodium 220 MG CAPS Take 500 mg by mouth.    . ondansetron (ZOFRAN ODT) 4 MG disintegrating tablet Take 1 tablet (4 mg total) by mouth every 8 (eight) hours as needed for nausea. 10 tablet 0  . traZODone (DESYREL) 50 MG tablet Take 0.5-1 tablets (25-50 mg total) by mouth at bedtime as needed for sleep. 30 tablet 3  . vitamin C (ASCORBIC ACID) 500 MG tablet Take 500 mg by mouth daily.    Marland Kitchen. amoxicillin-clavulanate (AUGMENTIN)  875-125 MG per tablet Take 1 tablet by mouth every 12 (twelve) hours. Take with meal (Patient not taking: Reported on 05/08/2014) 20 tablet 0  . escitalopram (LEXAPRO) 10 MG tablet TAKE 1 TABLET (10 MG TOTAL) BY MOUTH DAILY. (Patient not taking: Reported on 05/08/2014) 30 tablet 3  . phenazopyridine (PYRIDIUM) 200 MG tablet Take 1 tablet (200 mg total) by mouth 3 (three) times daily as needed for pain. (Patient not taking: Reported on 05/08/2014) 5 tablet 0   No current facility-administered medications on file prior to visit.    Review of Systems    See HPI  Objective:    BP 160/88 mmHg  Pulse 76  Temp(Src) 98 F (36.7 C) (Oral)  Resp 18  Ht 5\' 3"  (1.6 m)  Wt 204 lb 12.8 oz (92.897 kg)  BMI 36.29 kg/m2  SpO2 98% Nursing note and vital signs reviewed.  Physical Exam  Constitutional: She is oriented to person, place, and time. She appears well-developed and well-nourished. No distress.  Cardiovascular: Normal rate, regular rhythm, normal heart sounds and intact distal pulses.   Pulmonary/Chest: Effort normal and breath sounds normal.  Neurological: She is alert and oriented to person, place, and time.  Skin: Skin is warm and dry.  Psychiatric: She has a normal mood and affect.  Her behavior is normal. Judgment and thought content normal.      Assessment & Plan:

## 2014-05-08 NOTE — Progress Notes (Signed)
Pre visit review using our clinic review tool, if applicable. No additional management support is needed unless otherwise documented below in the visit note. 

## 2014-06-13 ENCOUNTER — Encounter: Payer: Self-pay | Admitting: Emergency Medicine

## 2014-06-13 ENCOUNTER — Emergency Department (INDEPENDENT_AMBULATORY_CARE_PROVIDER_SITE_OTHER): Payer: 59

## 2014-06-13 ENCOUNTER — Emergency Department
Admission: EM | Admit: 2014-06-13 | Discharge: 2014-06-13 | Disposition: A | Discharge status: Home / Self Care | Payer: 59 | Source: Home / Self Care | Attending: Emergency Medicine | Admitting: Emergency Medicine

## 2014-06-13 DIAGNOSIS — R079 Chest pain, unspecified: Secondary | ICD-10-CM

## 2014-06-13 DIAGNOSIS — T148 Other injury of unspecified body region: Secondary | ICD-10-CM

## 2014-06-13 DIAGNOSIS — T148XXA Other injury of unspecified body region, initial encounter: Secondary | ICD-10-CM

## 2014-06-13 DIAGNOSIS — T1490XA Injury, unspecified, initial encounter: Secondary | ICD-10-CM

## 2014-06-13 DIAGNOSIS — T1490 Injury, unspecified: Secondary | ICD-10-CM

## 2014-06-13 IMAGING — CR DG RIBS W/ CHEST 3+V*R*
3 series · 3 of 3 positions shown · non-contrast
Comparison: None.

CLINICAL DATA: Right chest in rib pain since D Sim bur twentieth.
Wrestling injury.

EXAM:
RIGHT RIBS AND CHEST - 3+ VIEW

[view not recorded (1 of 3)]
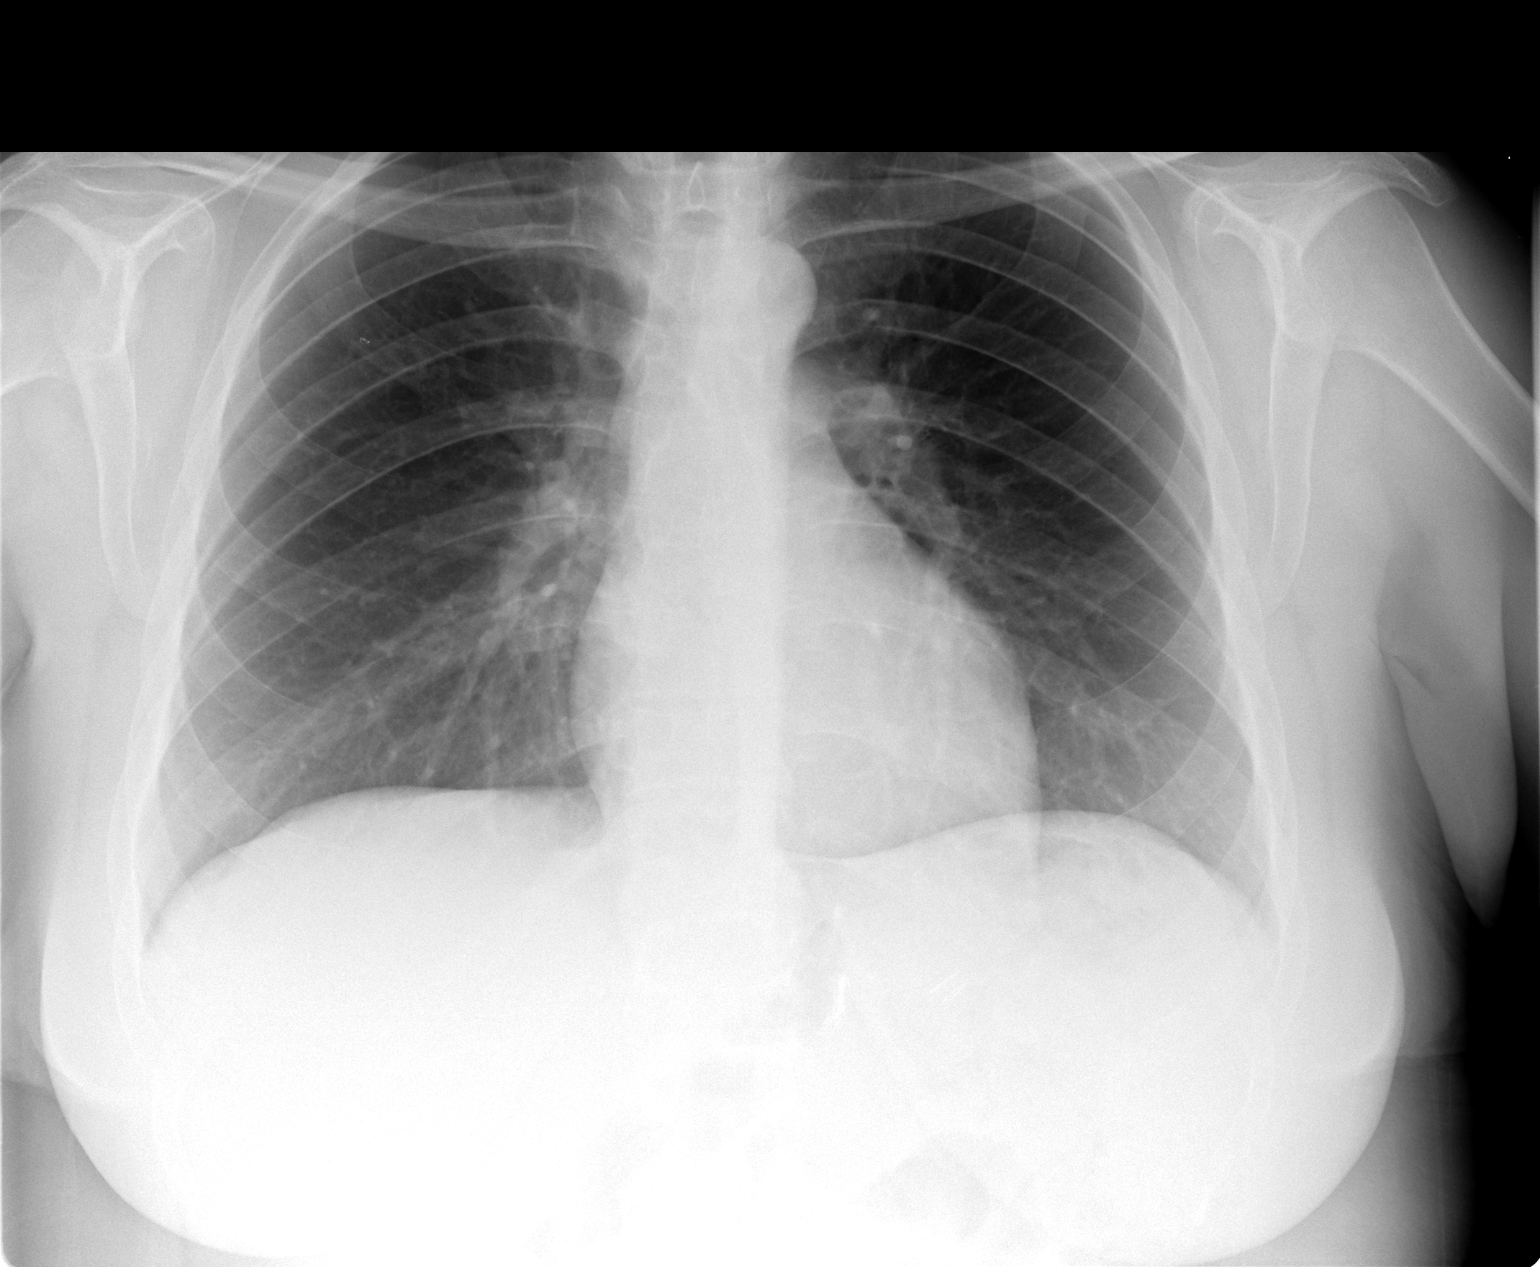

[view not recorded (2 of 3)]
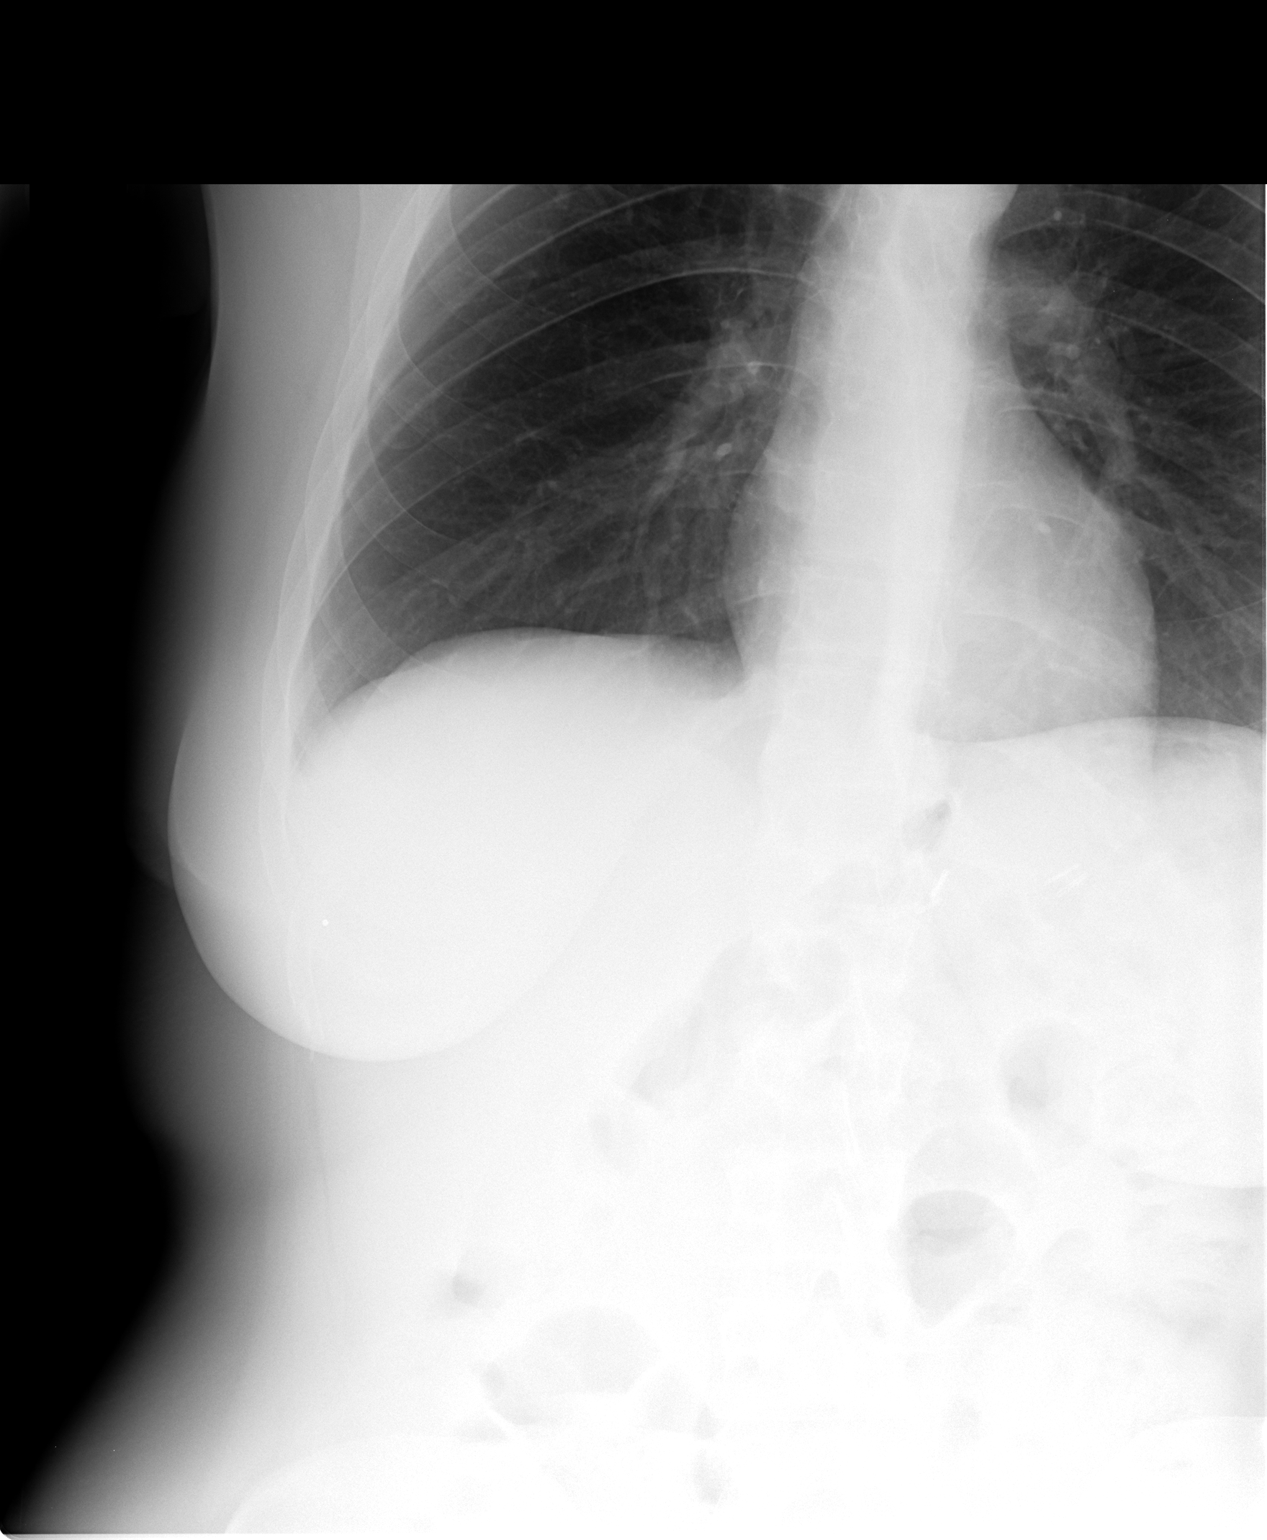

[view not recorded (3 of 3)]
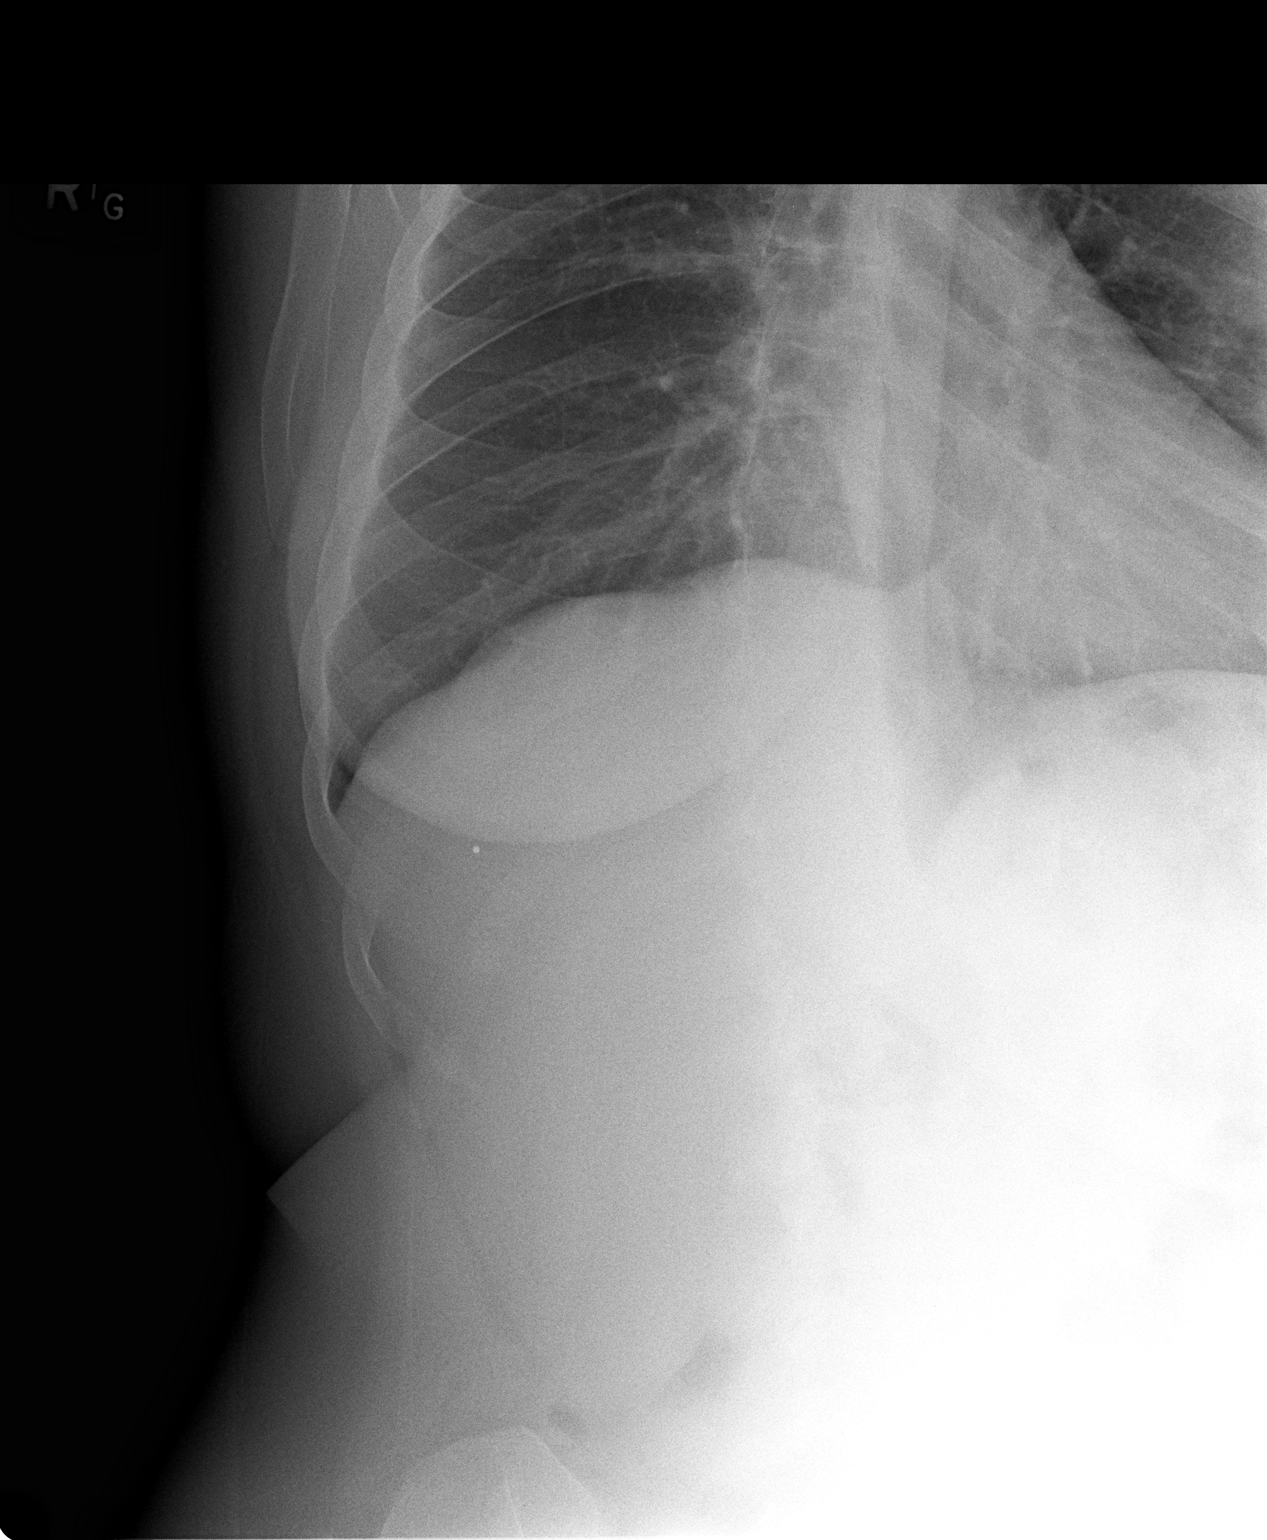

[3 of 3 positions shown; findings below may reference images not displayed]

FINDINGS: No fracture or other bone lesions are seen involving the ribs. There
is no evidence of pneumothorax or pleural effusion. Both lungs are
clear. Heart size and mediastinal contours are within normal limits.
IMPRESSION: Negative.

## 2014-06-13 MED ORDER — HYDROCODONE-ACETAMINOPHEN 5-325 MG PO TABS: 1.0000 | ORAL_TABLET | Freq: Four times a day (QID) | ORAL | Status: DC | PRN

## 2014-06-13 NOTE — ED Notes (Signed)
Rt rib injury, wrestling with husband 8 days ago, injured rt ribs, 6 constant up to 8 with activity

## 2014-06-13 NOTE — Discharge Instructions (Signed)
Contusion A contusion is a deep bruise. Contusions are the result of an injury that caused bleeding under the skin. The contusion may turn blue, purple, or yellow. Minor injuries will give you a painless contusion, but more severe contusions may stay painful and swollen for a few weeks.  CAUSES  A contusion is usually caused by a blow, trauma, or direct force to an area of the body. SYMPTOMS   Swelling and redness of the injured area.  Bruising of the injured area.  Tenderness and soreness of the injured area.  Pain. DIAGNOSIS  The diagnosis can be made by taking a history and physical exam. An X-ray, CT scan, or MRI may be needed to determine if there were any associated injuries, such as fractures. TREATMENT  Specific treatment will depend on what area of the body was injured. In general, the best treatment for a contusion is resting, icing, elevating, and applying cold compresses to the injured area. Over-the-counter medicines may also be recommended for pain control. Ask your caregiver what the best treatment is for your contusion. HOME CARE INSTRUCTIONS   Put ice on the injured area.  Put ice in a plastic bag.  Place a towel between your skin and the bag.  Leave the ice on for 15-20 minutes, 3-4 times a day, or as directed by your health care provider.  Only take over-the-counter or prescription medicines for pain, discomfort, or fever as directed by your caregiver. Your caregiver may recommend avoiding anti-inflammatory medicines (aspirin, ibuprofen, and naproxen) for 48 hours because these medicines may increase bruising.  Rest the injured area.  If possible, elevate the injured area to reduce swelling. SEEK IMMEDIATE MEDICAL CARE IF:   You have increased bruising or swelling.  You have pain that is getting worse.  Your swelling or pain is not relieved with medicines. MAKE SURE YOU:   Understand these instructions.  Will watch your condition.  Will get help right  away if you are not doing well or get worse. Document Released: 03/12/2005 Document Revised: 06/07/2013 Document Reviewed: 04/07/2011 [facility] Patient Information 2015 [place], [place]. This information is not intended to replace advice given to you by your health care provider. Make sure you discuss any questions you have with your health care provider. Rib Fracture A rib fracture is a break or crack in one of the bones of the ribs. The ribs are a group of long, curved bones that wrap around your chest and attach to your spine. They protect your lungs and other organs in the chest cavity. A broken or cracked rib is often painful, but most do not cause other problems. Most rib fractures heal on their own over time. However, rib fractures can be more serious if multiple ribs are broken or if broken ribs move out of place and push against other structures. CAUSES   A direct blow to the chest. For example, this could happen during contact sports, a car accident, or a fall against a hard object.  Repetitive movements with high force, such as pitching a baseball or having severe coughing spells. SYMPTOMS   Pain when you breathe in or cough.  Pain when someone presses on the injured area. DIAGNOSIS  Your caregiver will perform a physical exam. Various imaging tests may be ordered to confirm the diagnosis and to look for related injuries. These tests may include a chest X-ray, computed tomography (CT), magnetic resonance imaging (MRI), or a bone scan. TREATMENT  Rib fractures usually heal on their own in  1-3 months. The longer healing period is often associated with a continued cough or other aggravating activities. During the healing period, pain control is very important. Medication is usually given to control pain. Hospitalization or surgery may be needed for more severe injuries, such as those in which multiple ribs are broken or the ribs have moved out of place.  HOME CARE INSTRUCTIONS   Avoid  strenuous activity and any activities or movements that cause pain. Be careful during activities and avoid bumping the injured rib.  Gradually increase activity as directed by your caregiver.  Only take over-the-counter or prescription medications as directed by your caregiver. Do not take other medications without asking your caregiver first.  Apply ice to the injured area for the first 1-2 days after you have been treated or as directed by your caregiver. Applying ice helps to reduce inflammation and pain.  Put ice in a plastic bag.  Place a towel between your skin and the bag.   Leave the ice on for 15-20 minutes at a time, every 2 hours while you are awake.  Perform deep breathing as directed by your caregiver. This will help prevent pneumonia, which is a common complication of a broken rib. Your caregiver may instruct you to:  Take deep breaths several times a day.  Try to cough several times a day, holding a pillow against the injured area.  Use a device called an incentive spirometer to practice deep breathing several times a day.  Drink enough fluids to keep your urine clear or pale yellow. This will help you avoid constipation.   Do not wear a rib belt or binder. These restrict breathing, which can lead to pneumonia.  SEEK IMMEDIATE MEDICAL CARE IF:   You have a fever.   You have difficulty breathing or shortness of breath.   You develop a continual cough, or you cough up thick or bloody sputum.  You feel sick to your stomach (nausea), throw up (vomit), or have abdominal pain.   You have worsening pain not controlled with medications.  MAKE SURE YOU:  Understand these instructions.  Will watch your condition.  Will get help right away if you are not doing well or get worse. Document Released: 06/02/2005 Document Revised: 02/02/2013 Document Reviewed: 08/04/2012 [facility]Exit[facility] Patient Information 2015 [place], [place]. This information is not intended to replace  advice given to you by your health care provider. Make sure you discuss any questions you have with your health care provider.

## 2014-06-13 NOTE — ED Provider Notes (Signed)
CSN: [id number]     Arrival date & time 06/13/14  1634 History   First MD Initiated Contact with Patient 06/13/14 1659     Chief Complaint  Patient presents with  . Rib Injury   (Consider location/radiation/quality/duration/timing/severity/associated sxs/prior Treatment) Patient is a 28 y.o. female presenting with chest pain. The history is provided by the patient. No language interpreter was used.  Chest Pain Pain location:  R chest Pain quality: aching and stabbing   Pain radiates to:  Does not radiate Pain radiates to the back: no   Pain severity:  Moderate Duration:  8 days Timing:  Constant Progression:  Worsening Chronicity:  New Context: lifting, movement and raising an arm   Relieved by:  Nothing Worsened by:  Nothing tried Associated symptoms: no abdominal pain     Past Medical History  Diagnosis Date  . OBESITY   . DEPRESSION   . Anxiety   . Infertility, female hx   . Anemia     after 1st delivery  . GERD (gastroesophageal reflux disease)     pregnancy related- no meds  . PCOS (polycystic ovarian syndrome)   . Morbid obesity   . HYPOTHYROIDISM     hx of  . Headache(784.0)     hx of migraines  . Pregnancy induced hypertension     gestational  . Renal disorder    Past Surgical History  Procedure Laterality Date  . Cesarean section      x2  . Tonsillectomy    . Wisdom tooth extraction    . Tubal ligation    . Lithotripsy Left   . 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 Upper gi endoscopy  08/24/2012    Procedure: UPPER GI ENDOSCOPY;  Surgeon: Atilano InaEric M Wilson, MD;  Location: [facility];  Service: General;;   Family History  Problem Relation Age of Onset  . Diabetes Other   . Hyperlipidemia Other   . Hypertension Other   . Hypertension Mother   . Hyperlipidemia Father   . Hypertension Father    . Kidney disease Father   . Cancer Father   . Hypertension Maternal Grandmother   . Diabetes Maternal Grandfather    History  Substance Use Topics  . Smoking status: Never Smoker   . Smokeless tobacco: Never Used  . Alcohol Use: No     Comment: rare   OB History    Gravida Para Term Preterm AB TAB SAB Ectopic Multiple Living   2 2 2  0 0 0 0 0 0 2     Review of Systems  Cardiovascular: Positive for chest pain.  Gastrointestinal: Negative for abdominal pain.  All other systems reviewed and are negative.   Allergies  Review of patient's allergies indicates not on file.  Home Medications   Prior to Admission medications   Medication Sig Start Date End Date Taking? Authorizing Provider  Acetaminophen (TYLENOL PO) Take 500 mg by mouth as needed.    Historical Provider, MD  ALPRAZolam Prudy Feeler(XANAX) 0.5 MG tablet TAKE 1 TABLET BY MOUTH TWICE DAILY AS NEEDED ANXIETY 03/21/14   Newt LukesValerie A Leschber, MD  amoxicillin-clavulanate (AUGMENTIN) 875-125 MG per tablet Take 1 tablet by mouth every 12 (twelve) hours. Take with meal Patient not taking: Reported on 05/08/2014 09/20/13   Pecola LawlessWilliam F Hopper, MD  Calcium Citrate-Vitamin D (CALCIUM CITRATE + D  PO) Take 1,200 mg by mouth daily.     Historical Provider, MD  Cyanocobalamin (NASCOBAL) 500 MCG/0.1ML SOLN Place 0.1 mLs into the nose once a week. Take on mondays    Historical Provider, MD  DULoxetine (CYMBALTA) 60 MG capsule Take 1 capsule (60 mg total) by mouth daily. 05/08/14   Jeanine LuzGregory Calone, FNP  escitalopram (LEXAPRO) 10 MG tablet TAKE 1 TABLET (10 MG TOTAL) BY MOUTH DAILY. Patient not taking: Reported on 05/08/2014 11/11/13   Newt LukesValerie A Leschber, MD  HYDROcodone-acetaminophen (NORCO/VICODIN) 5-325 MG per tablet Take 1 tablet by mouth every 6 (six) hours as needed for moderate pain. 06/13/14   Elson AreasLeslie K Mana Haberl, PA-C  IRON PO Take 270 mg by mouth daily.     Historical Provider, MD  Multiple Vitamin (MULTI-VITAMIN DAILY PO) Take 1 tablet by mouth 2  (two) times daily.     Historical Provider, MD  Naproxen Sodium 220 MG CAPS Take 500 mg by mouth.    Historical Provider, MD  ondansetron (ZOFRAN ODT) 4 MG disintegrating tablet Take 1 tablet (4 mg total) by mouth every 8 (eight) hours as needed for nausea. 05/07/13   Garlon HatchetLisa M Sanders, PA-C  phenazopyridine (PYRIDIUM) 200 MG tablet Take 1 tablet (200 mg total) by mouth 3 (three) times daily as needed for pain. Patient not taking: Reported on 05/08/2014 07/18/13   Elwin MochaBlair Walden, MD  traZODone (DESYREL) 50 MG tablet Take 0.5-1 tablets (25-50 mg total) by mouth at bedtime as needed for sleep. 04/14/14   Jeanine LuzGregory Calone, FNP  vitamin C (ASCORBIC ACID) 500 MG tablet Take 500 mg by mouth daily.    Historical Provider, MD   BP 116/82 mmHg  Pulse 84  Temp(Src) 97.9 F (36.6 C) (Oral)  Ht 5\' 4"  (1.626 m)  Wt 201 lb (91.173 kg)  BMI 34.48 kg/m2  SpO2 99%  LMP 05/16/2014 Physical Exam  Constitutional: She appears well-developed and well-nourished.  HENT:  Head: Normocephalic and atraumatic.  Neck: Normal range of motion. Neck supple.  Cardiovascular: Normal rate.   Pulmonary/Chest: Effort normal.  Tender right chest, tender along rib line  Abdominal: Soft.  Musculoskeletal: Normal range of motion.  Neurological: She is alert.  Skin: Skin is warm.  Nursing note and vitals reviewed.   ED Course  Procedures (including critical care time) Labs Review Labs Reviewed - No data to display  Imaging Review Dg Ribs Unilateral W/chest Right  06/13/2014   CLINICAL DATA:  Right chest in rib pain since D Sim bur twentieth. Wrestling injury.  EXAM: RIGHT RIBS AND CHEST - 3+ VIEW  COMPARISON:  None.  FINDINGS: No fracture or other bone lesions are seen involving the ribs. There is no evidence of pneumothorax or pleural effusion. Both lungs are clear. Heart size and mediastinal contours are within normal limits.  IMPRESSION: Negative.   Electronically Signed   By: Signa Kellaylor  Stroud M.D.   On: 06/13/2014 17:43      MDM I counseled pt,  She clinically has a rib fracture.   I advised continue ibuprofen.  Pt given rx for hydrocodone. Pt advised to see her MD for recheck in 1 week.   1. Contusion   2. Injury    AVS     Elson AreasLeslie K Dijuan Sleeth, PA-C 06/13/14 [id number]

## 2014-08-08 ENCOUNTER — Encounter: Payer: Self-pay | Admitting: Internal Medicine

## 2014-08-08 ENCOUNTER — Ambulatory Visit (INDEPENDENT_AMBULATORY_CARE_PROVIDER_SITE_OTHER): Payer: 59 | Admitting: Internal Medicine

## 2014-08-08 VITALS — BP 132/78 | HR 106 | Temp 97.5°F | Ht 67.0 in | Wt 198.0 lb

## 2014-08-08 DIAGNOSIS — F32A Depression, unspecified: Secondary | ICD-10-CM

## 2014-08-08 DIAGNOSIS — F329 Major depressive disorder, single episode, unspecified: Secondary | ICD-10-CM

## 2014-08-08 DIAGNOSIS — Z0000 Encounter for general adult medical examination without abnormal findings: Secondary | ICD-10-CM

## 2014-08-08 DIAGNOSIS — Z9884 Bariatric surgery status: Secondary | ICD-10-CM

## 2014-08-08 MED ORDER — CLONAZEPAM 0.5 MG PO TABS: 0.2500 mg | ORAL_TABLET | Freq: Two times a day (BID) | ORAL | Status: DC | PRN

## 2014-08-08 NOTE — Patient Instructions (Addendum)
It was good to see you today.  We have reviewed your prior records including labs and tests today  Health Maintenance reviewed - all recommended immunizations and age-appropriate screenings are up-to-date.  Test(s) ordered today. Return when you are fasting. Your results will be released to [facility] (or called to you) after review, usually within 72hours after test completion. If any changes need to be made, you will be notified at that same time.  Medications reviewed and updated Change alpraz to clonaz as needed - no other changes recommended at this time.  Please schedule followup in 12 months for annual exam and labs, call sooner if problems.  Health Maintenance Adopting a healthy lifestyle and getting preventive care can go a long way to promote health and wellness. Talk with your health care provider about what schedule of regular examinations is right for you. This is a good chance for you to check in with your provider about disease prevention and staying healthy. In between checkups, there are plenty of things you can do on your own. Experts have done a lot of research about which lifestyle changes and preventive measures are most likely to keep you healthy. Ask your health care provider for more information. WEIGHT AND DIET  Eat a healthy diet  Be sure to include plenty of vegetables, fruits, low-fat dairy products, and lean protein.  Do not eat a lot of foods high in solid fats, added sugars, or salt.  Get regular exercise. This is one of the most important things you can do for your health.  Most adults should exercise for at least 150 minutes each week. The exercise should increase your heart rate and make you sweat (moderate-intensity exercise).  Most adults should also do strengthening exercises at least twice a week. This is in addition to the moderate-intensity exercise.  Maintain a healthy weight  Body mass index (BMI) is a measurement that can be used to identify  possible weight problems. It estimates body fat based on height and weight. Your health care provider can help determine your BMI and help you achieve or maintain a healthy weight.  For females 19 years of age and older:   A BMI below 18.5 is considered underweight.  A BMI of 18.5 to 24.9 is normal.  A BMI of 25 to 29.9 is considered overweight.  A BMI of 30 and above is considered obese.  Watch levels of cholesterol and blood lipids  You should start having your blood tested for lipids and cholesterol at 31 years of age, then have this test every 5 years.  You may need to have your cholesterol levels checked more often if:  Your lipid or cholesterol levels are high.  You are older than 31 years of age.  You are at high risk for heart disease.  CANCER SCREENING   Lung Cancer  Lung cancer screening is recommended for adults 47-13 years old who are at high risk for lung cancer because of a history of smoking.  A yearly low-dose CT scan of the lungs is recommended for people who:  Currently smoke.  Have quit within the past 15 years.  Have at least a 30-pack-year history of smoking. A pack year is smoking an average of one pack of cigarettes a day for 1 year.  Yearly screening should continue until it has been 15 years since you quit.  Yearly screening should stop if you develop a health problem that would prevent you from having lung cancer treatment.  Breast Cancer  Practice breast self-awareness. This means understanding how your breasts normally appear and feel.  It also means doing regular breast self-exams. Let your health care provider know about any changes, no matter how small.  If you are in your 20s or 30s, you should have a clinical breast exam (CBE) by a health care provider every 1-3 years as part of a regular health exam.  If you are 51 or older, have a CBE every year. Also consider having a breast X-ray (mammogram) every year.  If you have a family  history of breast cancer, talk to your health care provider about genetic screening.  If you are at high risk for breast cancer, talk to your health care provider about having an MRI and a mammogram every year.  Breast cancer gene (BRCA) assessment is recommended for women who have family members with BRCA-related cancers. BRCA-related cancers include:  Breast.  Ovarian.  Tubal.  Peritoneal cancers.  Results of the assessment will determine the need for genetic counseling and BRCA1 and BRCA2 testing. Cervical Cancer Routine pelvic examinations to screen for cervical cancer are no longer recommended for nonpregnant women who are considered low risk for cancer of the pelvic organs (ovaries, uterus, and vagina) and who do not have symptoms. A pelvic examination may be necessary if you have symptoms including those associated with pelvic infections. Ask your health care provider if a screening pelvic exam is right for you.   The Pap test is the screening test for cervical cancer for women who are considered at risk.  If you had a hysterectomy for a problem that was not cancer or a condition that could lead to cancer, then you no longer need Pap tests.  If you are older than 65 years, and you have had normal Pap tests for the past 10 years, you no longer need to have Pap tests.  If you have had past treatment for cervical cancer or a condition that could lead to cancer, you need Pap tests and screening for cancer for at least 20 years after your treatment.  If you no longer get a Pap test, assess your risk factors if they change (such as having a new sexual partner). This can affect whether you should start being screened again.  Some women have medical problems that increase their chance of getting cervical cancer. If this is the case for you, your health care provider may recommend more frequent screening and Pap tests.  The human papillomavirus (HPV) test is another test that may be used  for cervical cancer screening. The HPV test looks for the virus that can cause cell changes in the cervix. The cells collected during the Pap test can be tested for HPV.  The HPV test can be used to screen women 62 years of age and older. Getting tested for HPV can extend the interval between normal Pap tests from three to five years.  An HPV test also should be used to screen women of any age who have unclear Pap test results.  After 31 years of age, women should have HPV testing as often as Pap tests.  Colorectal Cancer  This type of cancer can be detected and often prevented.  Routine colorectal cancer screening usually begins at 31 years of age and continues through 31 years of age.  Your health care provider may recommend screening at an earlier age if you have risk factors for colon cancer.  Your health care provider may also recommend using home test kits  to check for hidden blood in the stool.  A small camera at the end of a tube can be used to examine your colon directly (sigmoidoscopy or colonoscopy). This is done to check for the earliest forms of colorectal cancer.  Routine screening usually begins at age 6.  Direct examination of the colon should be repeated every 5-10 years through 31 years of age. However, you may need to be screened more often if early forms of precancerous polyps or small growths are found. Skin Cancer  Check your skin from head to toe regularly.  Tell your health care provider about any new moles or changes in moles, especially if there is a change in a mole's shape or color.  Also tell your health care provider if you have a mole that is larger than the size of a pencil eraser.  Always use sunscreen. Apply sunscreen liberally and repeatedly throughout the day.  Protect yourself by wearing long sleeves, pants, a wide-brimmed hat, and sunglasses whenever you are outside. HEART DISEASE, DIABETES, AND HIGH BLOOD PRESSURE   Have your blood pressure  checked at least every 1-2 years. High blood pressure causes heart disease and increases the risk of stroke.  If you are between 72 years and 61 years old, ask your health care provider if you should take aspirin to prevent strokes.  Have regular diabetes screenings. This involves taking a blood sample to check your fasting blood sugar level.  If you are at a normal weight and have a low risk for diabetes, have this test once every three years after 31 years of age.  If you are overweight and have a high risk for diabetes, consider being tested at a younger age or more often. PREVENTING INFECTION  Hepatitis B  If you have a higher risk for hepatitis B, you should be screened for this virus. You are considered at high risk for hepatitis B if:  You were born in a country where hepatitis B is common. Ask your health care provider which countries are considered high risk.  Your parents were born in a high-risk country, and you have not been immunized against hepatitis B (hepatitis B vaccine).  You have HIV or AIDS.  You use needles to inject street drugs.  You live with someone who has hepatitis B.  You have had sex with someone who has hepatitis B.  You get hemodialysis treatment.  You take certain medicines for conditions, including cancer, organ transplantation, and autoimmune conditions. Hepatitis C  Blood testing is recommended for:  Everyone born from [birth year] through 1967.  Anyone with known risk factors for hepatitis C. Sexually transmitted infections (STIs)  You should be screened for sexually transmitted infections (STIs) including gonorrhea and chlamydia if:  You are sexually active and are younger than 31 years of age.  You are older than 31 years of age and your health care provider tells you that you are at risk for this type of infection.  Your sexual activity has changed since you were last screened and you are at an increased risk for chlamydia or gonorrhea. Ask  your health care provider if you are at risk.  If you do not have HIV, but are at risk, it may be recommended that you take a prescription medicine daily to prevent HIV infection. This is called pre-exposure prophylaxis (PrEP). You are considered at risk if:  You are sexually active and do not regularly use condoms or know the HIV status of your partner(s).  You  take drugs by injection.  You are sexually active with a partner who has HIV. Talk with your health care provider about whether you are at high risk of being infected with HIV. If you choose to begin PrEP, you should first be tested for HIV. You should then be tested every 3 months for as long as you are taking PrEP.  PREGNANCY   If you are premenopausal and you may become pregnant, ask your health care provider about preconception counseling.  If you may become pregnant, take 400 to 800 micrograms (mcg) of folic acid every day.  If you want to prevent pregnancy, talk to your health care provider about birth control (contraception). OSTEOPOROSIS AND MENOPAUSE   Osteoporosis is a disease in which the bones lose minerals and strength with aging. This can result in serious bone fractures. Your risk for osteoporosis can be identified using a bone density scan.  If you are 79 years of age or older, or if you are at risk for osteoporosis and fractures, ask your health care provider if you should be screened.  Ask your health care provider whether you should take a calcium or vitamin D supplement to lower your risk for osteoporosis.  Menopause may have certain physical symptoms and risks.  Hormone replacement therapy may reduce some of these symptoms and risks. Talk to your health care provider about whether hormone replacement therapy is right for you.  HOME CARE INSTRUCTIONS   Schedule regular health, dental, and eye exams.  Stay current with your immunizations.   Do not use any tobacco products including cigarettes, chewing  tobacco, or electronic cigarettes.  If you are pregnant, do not drink alcohol.  If you are breastfeeding, limit how much and how often you drink alcohol.  Limit alcohol intake to no more than 1 drink per day for nonpregnant women. One drink equals 12 ounces of beer, 5 ounces of wine, or 1 ounces of hard liquor.  Do not use street drugs.  Do not share needles.  Ask your health care provider for help if you need support or information about quitting drugs.  Tell your health care provider if you often feel depressed.  Tell your health care provider if you have ever been abused or do not feel safe at home. Document Released: 12/16/2010 Document Revised: 10/17/2013 Document Reviewed: 05/04/2013 [facility] Patient Information 2015 [place], [place]. This information is not intended to replace advice given to you by your health care provider. Make sure you discuss any questions you have with your health care provider.

## 2014-08-08 NOTE — Assessment & Plan Note (Signed)
Changed from lexapro to cymbalta 04/2014 - doing well Change BZ to clonazepam for chronic insomnia - ok to continue traz prn as well

## 2014-08-08 NOTE — Progress Notes (Signed)
Pre visit review using our clinic review tool, if applicable. No additional management support is needed unless otherwise documented below in the visit note. 

## 2014-08-08 NOTE — Assessment & Plan Note (Signed)
08/2012 bariatric surgery - weight maintained Check labs to rule out deficiency -B12, B1, iron

## 2014-08-08 NOTE — Progress Notes (Signed)
Subjective:    Patient ID: [name] [name], female    DOB: 1985/10/19, 29 y.o.   MRN: [id number]  HPI  patient is here today for annual physical. Patient feels well overall.  Also reviewed chronic medical issues and interval medical events   Past Medical History  Diagnosis Date  . OBESITY   . DEPRESSION   . Anxiety   . Infertility, female hx   . Anemia     after 1st delivery  . GERD (gastroesophageal reflux disease)     pregnancy related- no meds  . PCOS (polycystic ovarian syndrome)   . Morbid obesity     s/p RY 08/2012 - start weight 290#  . HYPOTHYROIDISM     hx of, normalized TSH during pregnanacy 2011  . Headache(784.0)     hx of migraines  . Pregnancy induced hypertension     gestational   Family History  Problem Relation Age of Onset  . Hypertension Mother   . Hyperlipidemia Father   . Hypertension Father   . Kidney disease Father   . Colon cancer Father 75    colon, met to liver  . Hypertension Maternal Grandmother   . Diabetes Maternal Grandfather   . Uterine cancer Maternal Grandmother 76   History  Substance Use Topics  . Smoking status: Never Smoker   . Smokeless tobacco: Never Used  . Alcohol Use: No     Comment: rare     Review of Systems  Constitutional: Negative for fatigue and unexpected weight change.  Respiratory: Negative for cough, shortness of breath and wheezing.   Cardiovascular: Negative for chest pain, palpitations and leg swelling.  Gastrointestinal: Negative for nausea, abdominal pain and diarrhea.  Neurological: Negative for dizziness, weakness, light-headedness and headaches.  Psychiatric/Behavioral: Positive for sleep disturbance (trouble falling asleep and staying asleep due to anxiety). Negative for dysphoric mood. The patient is not nervous/anxious.   All other systems reviewed and are negative.      Objective:    Physical Exam  Constitutional: She is oriented to person, place, and time. She appears  well-developed and well-nourished. No distress.  HENT:  Head: Normocephalic and atraumatic.  Right Ear: External ear normal.  Left Ear: External ear normal.  Nose: Nose normal.  Mouth/Throat: Oropharynx is clear and moist. No oropharyngeal exudate.  Eyes: EOM are normal. Pupils are equal, round, and reactive to light. Right eye exhibits no discharge. Left eye exhibits no discharge. No scleral icterus.  Neck: Normal range of motion. Neck supple. No JVD present. No tracheal deviation present. No thyromegaly present.  Cardiovascular: Normal rate, regular rhythm, normal heart sounds and intact distal pulses.  Exam reveals no friction rub.   No murmur heard. Pulmonary/Chest: Effort normal and breath sounds normal. No respiratory distress. She has no wheezes. She has no rales. She exhibits no tenderness.  Abdominal: Soft. Bowel sounds are normal. She exhibits no distension and no mass. There is no tenderness. There is no rebound and no guarding.  Genitourinary:  Defer to gyn  Musculoskeletal: Normal range of motion.  No gross deformities  Lymphadenopathy:    She has no cervical adenopathy.  Neurological: She is alert and oriented to person, place, and time. She has normal reflexes. No cranial nerve deficit.  Skin: Skin is warm and dry. No rash noted. She is not diaphoretic. No erythema.  Psychiatric: She has a normal mood and affect. Her behavior is normal. Judgment and thought content normal.  Nursing note and vitals reviewed.   BP  132/78 mmHg  Pulse 106  Temp(Src) 97.5 F (36.4 C) (Oral)  Ht 5' 7"  (1.702 m)  Wt 198 lb (89.812 kg)  BMI 31.00 kg/m2  SpO2 96% Wt Readings from Last 3 Encounters:  08/08/14 198 lb (89.812 kg)  06/13/14 201 lb (91.173 kg)  05/08/14 204 lb 12.8 oz (92.897 kg)    Lab Results  Component Value Date   WBC 9.3 07/18/2013   HGB 10.9* 07/18/2013   HCT 32.5* 07/18/2013   PLT 228 07/18/2013   GLUCOSE 89 07/18/2013   CHOL 155 04/14/2013   TRIG 110 04/14/2013    HDL 49 04/14/2013   LDLDIRECT 140.3 04/03/2010   LDLCALC 84 04/14/2013   ALT 9 04/14/2013   AST 11 04/14/2013   NA 140 07/18/2013   K 4.3 07/18/2013   CL 103 07/18/2013   CREATININE 1.00 07/18/2013   BUN 19 07/18/2013   CO2 27 07/18/2013   TSH 2.067 06/23/2012    Dg Ribs Unilateral W/chest Right  06/13/2014   CLINICAL DATA:  Right chest in rib pain since D Sim bur twentieth. Wrestling injury.  EXAM: RIGHT RIBS AND CHEST - 3+ VIEW  COMPARISON:  None.  FINDINGS: No fracture or other bone lesions are seen involving the ribs. There is no evidence of pneumothorax or pleural effusion. Both lungs are clear. Heart size and mediastinal contours are within normal limits.  IMPRESSION: Negative.   Electronically Signed   By: Kerby Moors M.D.   On: 06/13/2014 17:43       Assessment & Plan:   CPX/z00.00 - Patient has been counseled on age-appropriate routine health concerns for screening and prevention. These are reviewed and up-to-date. Immunizations are up-to-date or declined. Labs ordered and reviewed.  Problem List Items Addressed This Visit    Depression    Changed from lexapro to cymbalta 04/2014 - doing well Change BZ to clonazepam for chronic insomnia - ok to continue traz prn as well      History of Roux-en-Y gastric bypass    08/2012 bariatric surgery - weight maintained Check labs to rule out deficiency -B12, B1, iron      Relevant Orders   Vitamin B12   Vitamin B1   Ferritin   Vit D  25 hydroxy (rtn osteoporosis monitoring)    Other Visit Diagnoses    Routine general medical examination at a health care facility    -  Primary    Relevant Orders    Basic metabolic panel    CBC with Differential/Platelet    Hepatic function panel    Lipid panel    TSH    Urinalysis, Routine w reflex microscopic        Gwendolyn Grant, MD

## 2014-08-22 ENCOUNTER — Other Ambulatory Visit (INDEPENDENT_AMBULATORY_CARE_PROVIDER_SITE_OTHER): Payer: 59

## 2014-08-22 DIAGNOSIS — Z9884 Bariatric surgery status: Secondary | ICD-10-CM

## 2014-08-22 DIAGNOSIS — Z0000 Encounter for general adult medical examination without abnormal findings: Secondary | ICD-10-CM

## 2014-08-22 LAB — URINALYSIS, ROUTINE W REFLEX MICROSCOPIC
Bilirubin Urine: NEGATIVE
Hgb urine dipstick: NEGATIVE
Ketones, ur: NEGATIVE
Leukocytes, UA: NEGATIVE
Nitrite: NEGATIVE
SPECIFIC GRAVITY, URINE: 1.02 (ref 1.000–1.030)
Total Protein, Urine: NEGATIVE
UROBILINOGEN UA: 0.2 (ref 0.0–1.0)
Urine Glucose: NEGATIVE
pH: 7 (ref 5.0–8.0)

## 2014-08-22 LAB — BASIC METABOLIC PANEL
BUN: 13 mg/dL (ref 6–23)
CO2: 31 mEq/L (ref 19–32)
CREATININE: 0.58 mg/dL (ref 0.40–1.20)
Calcium: 8.9 mg/dL (ref 8.4–10.5)
Chloride: 105 mEq/L (ref 96–112)
GFR: 129.56 mL/min (ref >60.00)
Glucose, Bld: 83 mg/dL (ref 70–99)
POTASSIUM: 4 meq/L (ref 3.5–5.1)
Sodium: 139 mEq/L (ref 135–145)

## 2014-08-22 LAB — CBC WITH DIFFERENTIAL/PLATELET
Basophils Absolute: 0 10*3/uL (ref 0.0–0.1)
Basophils Relative: 0.5 % (ref 0.0–3.0)
EOS ABS: 0.1 10*3/uL (ref 0.0–0.7)
EOS PCT: 0.8 % (ref 0.0–5.0)
HCT: 32.5 % — ABNORMAL LOW (ref 36.0–46.0)
Hemoglobin: 10.8 g/dL — ABNORMAL LOW (ref 12.0–15.0)
LYMPHS ABS: 2.1 10*3/uL (ref 0.7–4.0)
Lymphocytes Relative: 30.2 % (ref 12.0–46.0)
MCHC: 33 g/dL (ref 30.0–36.0)
MCV: 76.4 fl — AB (ref 78.0–100.0)
MONO ABS: 0.5 10*3/uL (ref 0.1–1.0)
Monocytes Relative: 7.3 % (ref 3.0–12.0)
Neutro Abs: 4.2 10*3/uL (ref 1.4–7.7)
Neutrophils Relative %: 61.2 % (ref 43.0–77.0)
PLATELETS: 258 10*3/uL (ref 150.0–400.0)
RBC: 4.26 Mil/uL (ref 3.87–5.11)
RDW: 16.1 % — ABNORMAL HIGH (ref 11.5–15.5)
WBC: 6.9 10*3/uL (ref 4.0–10.5)

## 2014-08-22 LAB — LIPID PANEL
CHOL/HDL RATIO: 2
Cholesterol: 151 mg/dL (ref 0–200)
HDL: 67.5 mg/dL (ref >39.00)
LDL Cholesterol: 68 mg/dL (ref 0–99)
NonHDL: 83.5
TRIGLYCERIDES: 77 mg/dL (ref 0.0–149.0)
VLDL: 15.4 mg/dL (ref 0.0–40.0)

## 2014-08-22 LAB — HEPATIC FUNCTION PANEL
ALK PHOS: 140 U/L — AB (ref 39–117)
ALT: 16 U/L (ref 0–35)
AST: 15 U/L (ref 0–37)
Albumin: 3.8 g/dL (ref 3.5–5.2)
BILIRUBIN DIRECT: 0 mg/dL (ref 0.0–0.3)
Total Bilirubin: 0.3 mg/dL (ref 0.2–1.2)
Total Protein: 6.4 g/dL (ref 6.0–8.3)

## 2014-08-22 LAB — FERRITIN: FERRITIN: 2.9 ng/mL — AB (ref 10.0–291.0)

## 2014-08-22 LAB — TSH: TSH: 2.09 u[IU]/mL (ref 0.35–4.50)

## 2014-08-22 LAB — VITAMIN D 25 HYDROXY (VIT D DEFICIENCY, FRACTURES): VITD: 9.12 ng/mL — ABNORMAL LOW (ref 30.00–100.00)

## 2014-08-22 LAB — VITAMIN B12: VITAMIN B 12: 658 pg/mL (ref 211–911)

## 2014-08-26 LAB — VITAMIN B1: Vitamin B1 (Thiamine): 39 nmol/L — ABNORMAL HIGH (ref 8–30)

## 2014-09-10 ENCOUNTER — Telehealth: Payer: 59 | Admitting: Physician Assistant

## 2014-09-10 DIAGNOSIS — R197 Diarrhea, unspecified: Secondary | ICD-10-CM

## 2014-09-10 NOTE — Progress Notes (Signed)
We are sorry that you are not feeling well.  Here is how we plan to help!  Based on what you have shared with me it looks like you have Acute Infectious Diarrhea.  Most cases of acute diarrhea are due to infections with virus and bacteria and are self-limited conditions lasting less than 14 days.  For your symptoms you may take Imodium 2 mg tablets that are over the counter at your local pharmacy. Take two tablet now and then one after each loose stool up to 6 a day.  Antibiotics are not needed for most people with diarrhea.  If the feeling of lightheadedness is not improving with increased hydration, please seek care with your Primary medical doctor or an an Urgent Care or ER facility.  HOME CARE  We recommend changing your diet to help with your symptoms for the next few days.  Drink plenty of fluids that contain water salt and sugar. Sports drinks such as Gatorade may help.   You may try broths, soups, bananas, applesauce, soft breads, mashed potatoes or crackers.   You are considered infectious for as long as the diarrhea continues. Hand washing or use of alcohol based hand sanitizers is recommend.  It is best to stay out of work or school until your symptoms stop.   GET HELP RIGHT AWAY  If you have dark yellow colored urine or do not pass urine frequently you should drink more fluids.    If your symptoms worsen   If you feel like you are going to pass out (faint)  You have a new problem  MAKE SURE YOU   Understand these instructions.  Will watch your condition.  Will get help right away if you are not doing well or get worse.  Your e-visit answers were reviewed by a board certified advanced clinical practitioner to complete your personal care plan.  Depending on the condition, your plan could have included both over the counter or prescription medications.  If there is a problem please reply  once you have received a response from your provider.  Your safety is  important to us.  If you have drug allergies check your prescription carefully.    You can use MyChart to ask questions about today's visit, request a non-urgent call back, or ask for a work or school excuse.  You will get an e-mail in the next two days asking about your experience.  I hope that your e-visit has been valuable and will speed your recovery. Thank you for using e-visits.

## 2014-09-15 ENCOUNTER — Encounter: Payer: Self-pay | Admitting: Internal Medicine

## 2014-10-03 ENCOUNTER — Other Ambulatory Visit: Payer: Self-pay | Admitting: Family

## 2014-12-13 ENCOUNTER — Encounter: Payer: Self-pay | Admitting: Internal Medicine

## 2014-12-14 MED ORDER — ZOLPIDEM TARTRATE 10 MG PO TABS: 10.0000 mg | ORAL_TABLET | Freq: Every evening | ORAL | Status: DC | PRN

## 2014-12-15 ENCOUNTER — Other Ambulatory Visit: Payer: Self-pay | Admitting: Internal Medicine

## 2014-12-15 NOTE — Telephone Encounter (Signed)
Done hardcopy to Dahlia  

## 2014-12-15 NOTE — Telephone Encounter (Signed)
Rx faxed to pharmacy  

## 2015-01-02 ENCOUNTER — Encounter (HOSPITAL_COMMUNITY): Payer: Self-pay | Admitting: Emergency Medicine

## 2015-01-02 ENCOUNTER — Emergency Department (HOSPITAL_COMMUNITY): Payer: 59

## 2015-01-02 ENCOUNTER — Emergency Department (HOSPITAL_COMMUNITY)
Admission: EM | Admit: 2015-01-02 | Discharge: 2015-01-02 | Disposition: A | Discharge status: Home / Self Care | Payer: 59 | Attending: Emergency Medicine | Admitting: Emergency Medicine

## 2015-01-02 DIAGNOSIS — R1011 Right upper quadrant pain: Secondary | ICD-10-CM | POA: Insufficient documentation

## 2015-01-02 DIAGNOSIS — K219 Gastro-esophageal reflux disease without esophagitis: Secondary | ICD-10-CM | POA: Insufficient documentation

## 2015-01-02 DIAGNOSIS — F329 Major depressive disorder, single episode, unspecified: Secondary | ICD-10-CM | POA: Insufficient documentation

## 2015-01-02 DIAGNOSIS — E039 Hypothyroidism, unspecified: Secondary | ICD-10-CM | POA: Diagnosis not present

## 2015-01-02 DIAGNOSIS — Z79899 Other long term (current) drug therapy: Secondary | ICD-10-CM | POA: Diagnosis not present

## 2015-01-02 DIAGNOSIS — F419 Anxiety disorder, unspecified: Secondary | ICD-10-CM | POA: Diagnosis not present

## 2015-01-02 DIAGNOSIS — Z862 Personal history of diseases of the blood and blood-forming organs and certain disorders involving the immune mechanism: Secondary | ICD-10-CM | POA: Insufficient documentation

## 2015-01-02 DIAGNOSIS — Z3202 Encounter for pregnancy test, result negative: Secondary | ICD-10-CM | POA: Diagnosis not present

## 2015-01-02 DIAGNOSIS — R1010 Upper abdominal pain, unspecified: Secondary | ICD-10-CM

## 2015-01-02 LAB — COMPREHENSIVE METABOLIC PANEL
ALT: 21 U/L (ref 14–54)
AST: 23 U/L (ref 15–41)
Albumin: 4.2 g/dL (ref 3.5–5.0)
Alkaline Phosphatase: 152 U/L — ABNORMAL HIGH (ref 38–126)
Anion gap: 6 (ref 5–15)
BILIRUBIN TOTAL: 0.3 mg/dL (ref 0.3–1.2)
BUN: 18 mg/dL (ref 6–20)
CALCIUM: 8.7 mg/dL — AB (ref 8.9–10.3)
CHLORIDE: 107 mmol/L (ref 101–111)
CO2: 28 mmol/L (ref 22–32)
Creatinine, Ser: 0.59 mg/dL (ref 0.44–1.00)
GFR calc non Af Amer: >60 mL/min (ref >60)
Glucose, Bld: 92 mg/dL (ref 65–99)
Potassium: 3.3 mmol/L — ABNORMAL LOW (ref 3.5–5.1)
SODIUM: 141 mmol/L (ref 135–145)
Total Protein: 6.9 g/dL (ref 6.5–8.1)

## 2015-01-02 LAB — URINALYSIS, ROUTINE W REFLEX MICROSCOPIC
GLUCOSE, UA: NEGATIVE mg/dL
Hgb urine dipstick: NEGATIVE
Ketones, ur: NEGATIVE mg/dL
LEUKOCYTES UA: NEGATIVE
Nitrite: NEGATIVE
PH: 6 (ref 5.0–8.0)
Protein, ur: NEGATIVE mg/dL
Specific Gravity, Urine: 1.029 (ref 1.005–1.030)
Urobilinogen, UA: 1 mg/dL (ref 0.0–1.0)

## 2015-01-02 LAB — I-STAT BETA HCG BLOOD, ED (MC, WL, AP ONLY): I-stat hCG, quantitative: <5 m[IU]/mL (ref <5)

## 2015-01-02 LAB — CBC
HEMATOCRIT: 32.3 % — AB (ref 36.0–46.0)
HEMOGLOBIN: 10 g/dL — AB (ref 12.0–15.0)
MCH: 23.4 pg — AB (ref 26.0–34.0)
MCHC: 31 g/dL (ref 30.0–36.0)
MCV: 75.5 fL — AB (ref 78.0–100.0)
PLATELETS: 249 10*3/uL (ref 150–400)
RBC: 4.28 MIL/uL (ref 3.87–5.11)
RDW: 15.5 % (ref 11.5–15.5)
WBC: 6.9 10*3/uL (ref 4.0–10.5)

## 2015-01-02 LAB — LIPASE, BLOOD: Lipase: 35 U/L (ref 22–51)

## 2015-01-02 IMAGING — CT CT ABD-PELV W/ CM
2 of 4 series · 16 of 46 positions shown, 18 images · IV contrast (OMNIPAQUE 300)
Comparison: [DATE]

CLINICAL DATA: Right upper quadrant pain starting [REDACTED], vomiting

EXAM:
CT ABDOMEN AND PELVIS WITH CONTRAST
TECHNIQUE: Multidetector CT imaging of the abdomen and pelvis was performed
using the standard protocol following bolus administration of
intravenous contrast.
CONTRAST:  100mL OMNIPAQUE IOHEXOL 300 MG/ML  SOLN

[Series 2: abd/pel with · axial · 0.78mm/px · z∈[+587,+1017]mm · 13 of 94 slices shown, 15 images]
[im 4/94  soft-tissue]
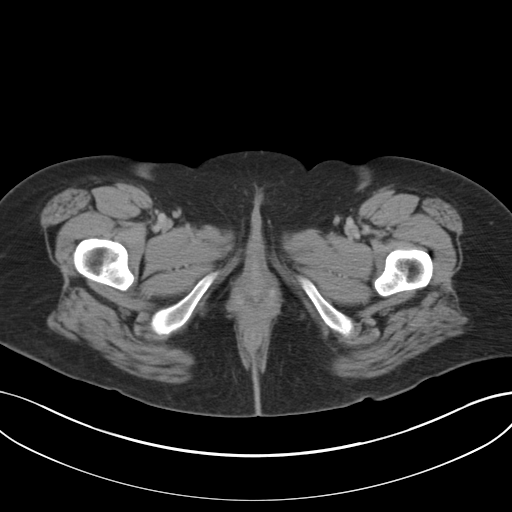
[im 4/94  bone]
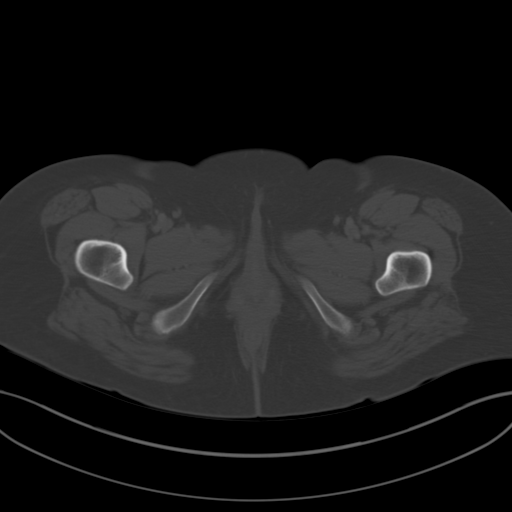
[im 11/94  soft-tissue]
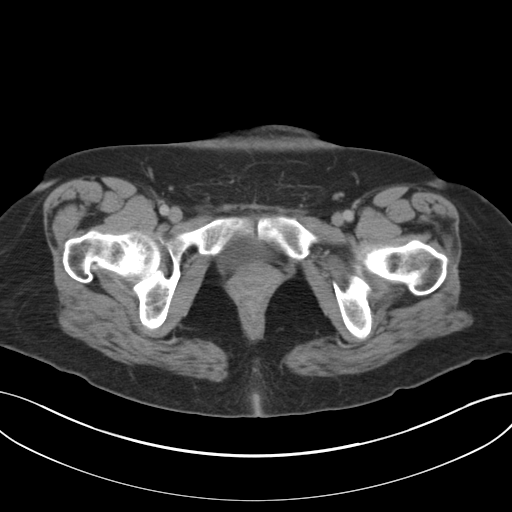
[im 18/94  soft-tissue]
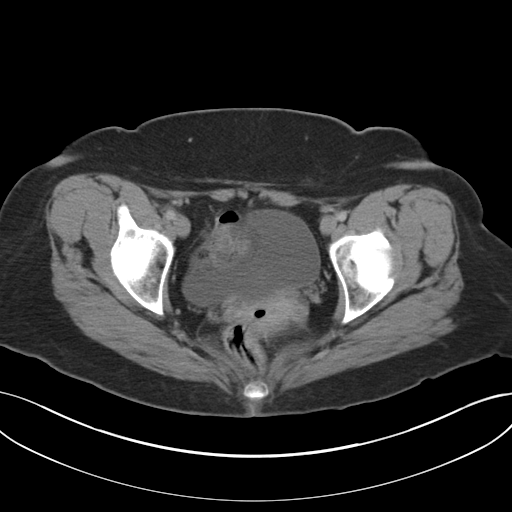
[im 26/94  soft-tissue]
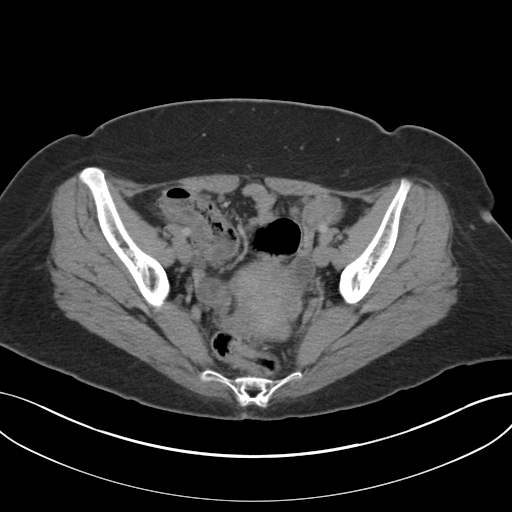
[im 33/94  soft-tissue]
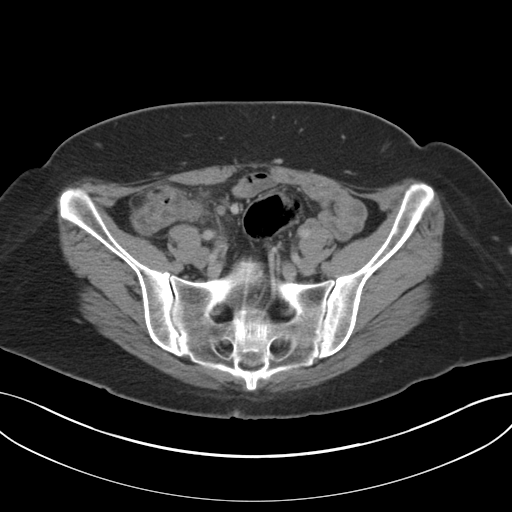
[im 40/94  soft-tissue]
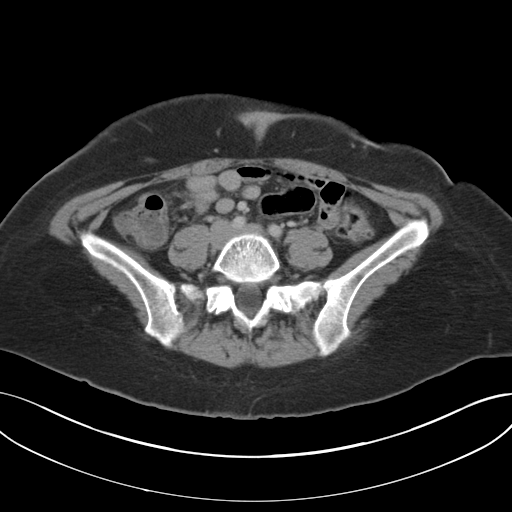
[im 47/94  soft-tissue]
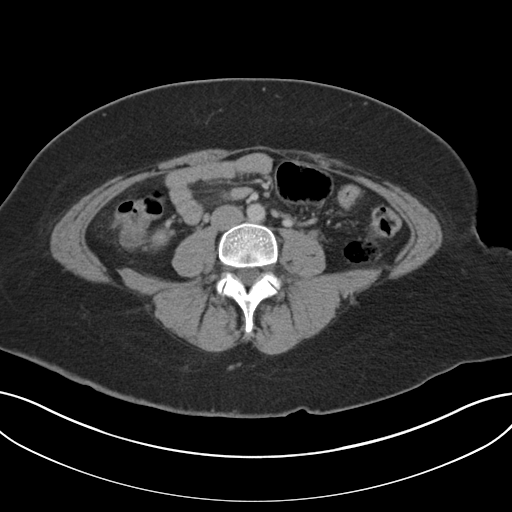
[im 54/94  soft-tissue]
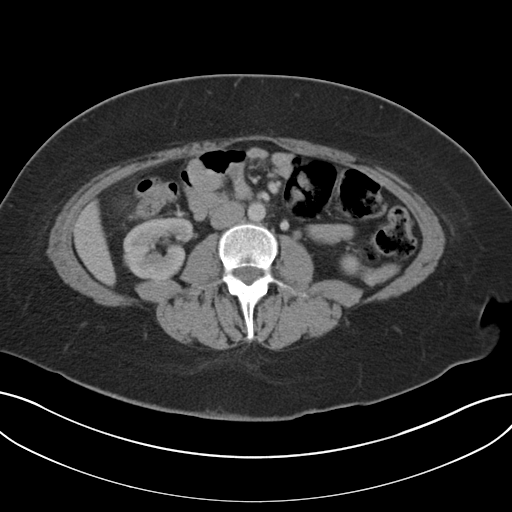
[im 61/94  soft-tissue]
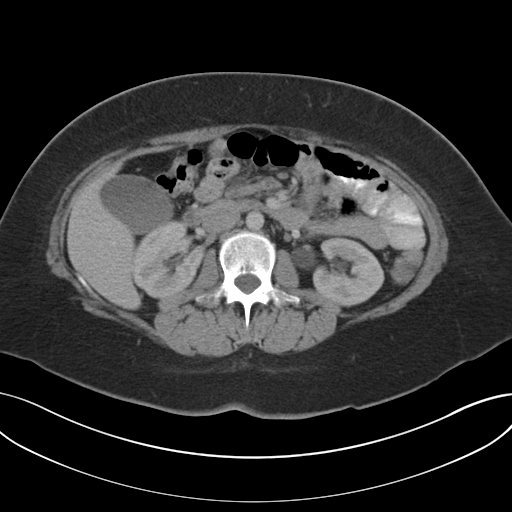
[im 61/94  bone]
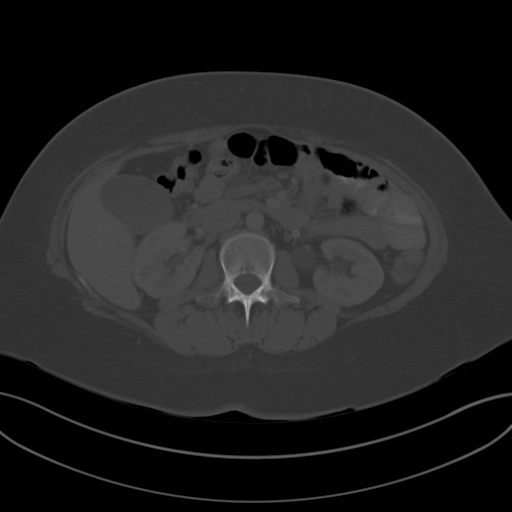
[im 68/94  soft-tissue]
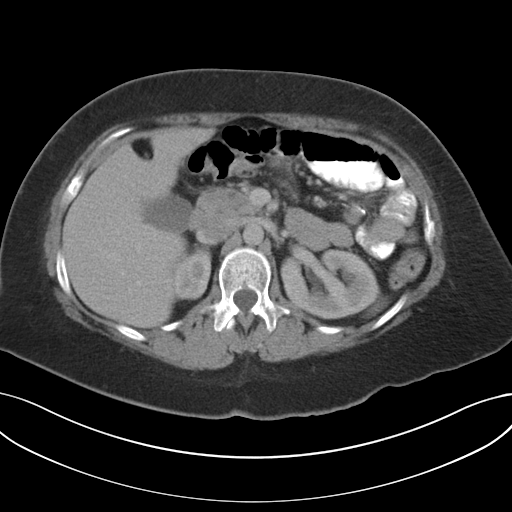
[im 76/94  soft-tissue]
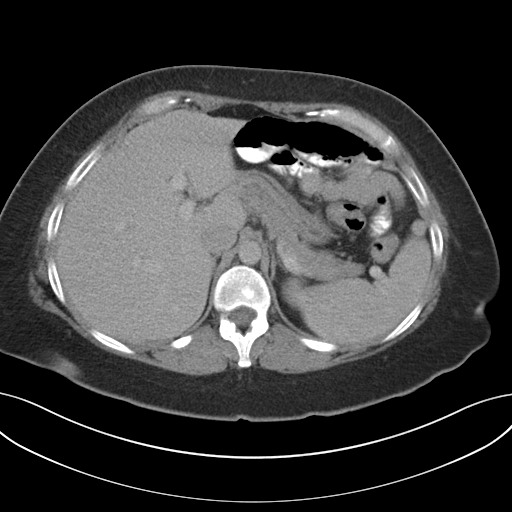
[im 83/94  soft-tissue]
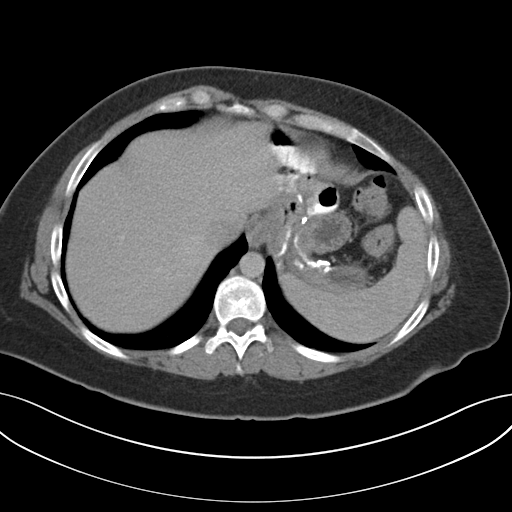
[im 90/94  soft-tissue]
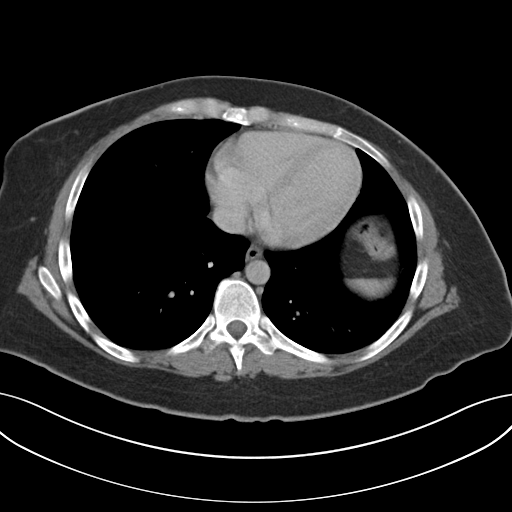

[Series 5: coronal a/|p · coronal · 0.74mm/px · 3 of 92 slices shown]
[im 31/92  soft-tissue]
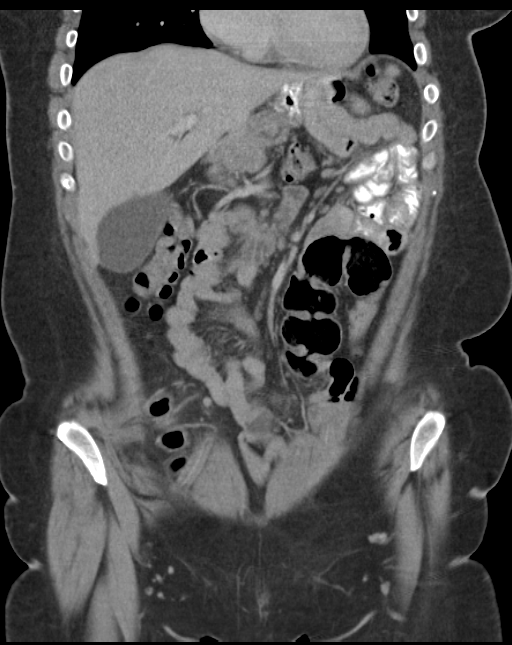
[im 41/92  soft-tissue]
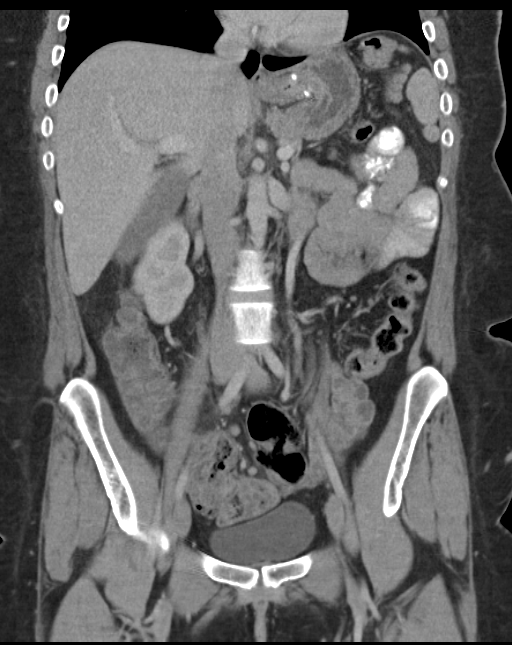
[im 51/92  soft-tissue]
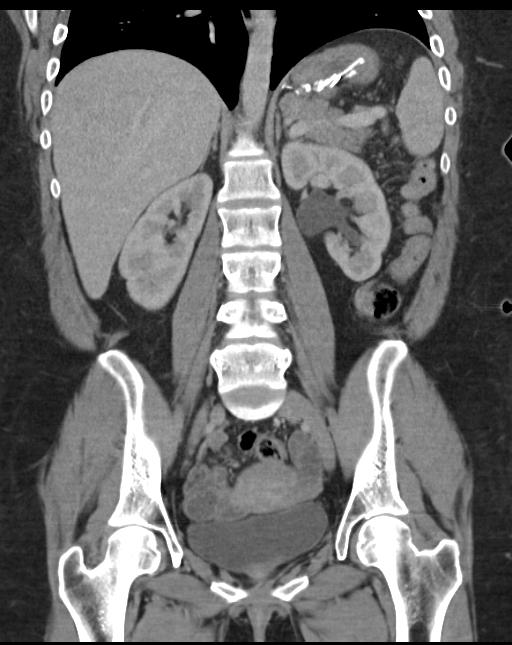

[16 of 46 positions shown; findings below may reference images not displayed]

FINDINGS: Lung bases are unremarkable. Sagittal images of the spine shows mild
degenerative changes lower thoracic spine.

Enhanced liver is unremarkable. No calcified gallstones are noted
within gallbladder. The pancreas, spleen and adrenal glands are
unremarkable. The patient is status post gastric bypass surgery.

Kidneys are symmetrical in size and enhancement. No hydronephrosis
or hydroureter. Stable chronic mild dilatation of left extrarenal
pelvis. No calcified ureteral calculi.

No small bowel obstruction. No ascites or free air. There is a low
lying cecum. The tip of the cecum is in right anterior pelvis just
above the urinary bladder. Normal appendix is noted in axial image
68. Mild enlarged lymph nodes in right lower quadrant mesentery the
largest measures 1.3 cm. This may be reactive. Mild mesenteric
adenitis cannot be excluded. Clinical correlation is necessary.

The uterus is unremarkable. There is a follicle/ cyst within left
ovary measures 2.4 by 0.3 cm. No pelvic free fluid. Urinary bladder
is unremarkable. A surgical clip is noted within posterior
cul-de-sac.
IMPRESSION: 1. No acute inflammatory process within abdomen.
2. No hydronephrosis or hydroureter.
3. Status post gastric bypass surgery.
4. There is a low lying cecum. Normal appendix. Mild enlarged lymph
nodes in right lower quadrant mesentery. This may be reactive in
nature. Mild mesenteric adenitis cannot be excluded.
5. There is a follicle/cyst within left ovary measures 2.4 cm.
6. Unremarkable uterus and right ovary.

## 2015-01-02 MED ORDER — MORPHINE SULFATE 4 MG/ML IJ SOLN
4.0000 mg | Freq: Once | INTRAMUSCULAR | Status: AC
  Administered 2015-01-02: 4 mg via INTRAVENOUS
  Filled 2015-01-02: qty 1

## 2015-01-02 MED ORDER — ONDANSETRON HCL 4 MG PO TABS
4.0000 mg | ORAL_TABLET | Freq: Once | ORAL | Status: DC
  Filled 2015-01-02: qty 1

## 2015-01-02 MED ORDER — PROMETHAZINE HCL 25 MG PO TABS: 25.0000 mg | ORAL_TABLET | Freq: Three times a day (TID) | ORAL | Status: DC | PRN

## 2015-01-02 MED ORDER — ONDANSETRON HCL 4 MG/2ML IJ SOLN
4.0000 mg | Freq: Once | INTRAMUSCULAR | Status: AC
  Administered 2015-01-02: 4 mg via INTRAVENOUS
  Filled 2015-01-02: qty 2

## 2015-01-02 MED ORDER — HYDROCODONE-ACETAMINOPHEN 5-325 MG PO TABS: 1.0000 | ORAL_TABLET | Freq: Four times a day (QID) | ORAL | Status: DC | PRN

## 2015-01-02 MED ORDER — IOHEXOL 300 MG/ML  SOLN
100.0000 mL | Freq: Once | INTRAMUSCULAR | Status: AC | PRN
  Administered 2015-01-02: 100 mL via INTRAVENOUS

## 2015-01-02 MED ORDER — DICYCLOMINE HCL 20 MG PO TABS: 20.0000 mg | ORAL_TABLET | Freq: Two times a day (BID) | ORAL | Status: DC

## 2015-01-02 NOTE — ED Notes (Signed)
Per pt, states right upper quadrant pain on and off since the weekend-nausea and vomiting after po intake-states discomfort radiates to back

## 2015-01-02 NOTE — ED Provider Notes (Signed)
CSN: [id number]     Arrival date & time 01/02/15  1331 History   First MD Initiated Contact with Patient 01/02/15 1334     Chief Complaint  Patient presents with  . Abdominal Pain     (Consider location/radiation/quality/duration/timing/severity/associated sxs/prior Treatment) HPI   Blood pressure 131/86, pulse 84, temperature 98 F (36.7 C), temperature source Oral, resp. rate 14, SpO2 100 %.  [name] [name] is a 29 y.o. female complaining of intermittent 6/10 right upper quadrant pain exacerbated postprandially onset 1 week ago associated with multiple episodes of nonbloody, nonbilious, non-coffee ground emesis and decreased by mouth intake. Patient denies fever, chills, history of gallstones.  Review of systems patient notes nonbloody, non-melanotic stool and foul-smelling flatus. The surgical history is significant for gastric bypass in 2014. She has history of multiple kidney stones, she's required intervention by [facility] to pass stones in the past. She states this doesn't feel like prior kidney stone exacerbation. She denies dysuria, hematuria, urinary frequency.   Past Medical History  Diagnosis Date  . OBESITY   . DEPRESSION   . Anxiety   . Infertility, female hx   . Anemia     after 1st delivery  . GERD (gastroesophageal reflux disease)     pregnancy related- no meds  . PCOS (polycystic ovarian syndrome)   . Morbid obesity     s/p RY 08/2012 - start weight 290#  . HYPOTHYROIDISM     hx of, normalized TSH during pregnanacy 2011  . Headache(784.0)     hx of migraines  . Pregnancy induced hypertension     gestational   Past Surgical History  Procedure Laterality Date  . Cesarean section      x2  . Tonsillectomy    . Wisdom tooth extraction    . Tubal ligation    . Lithotripsy Left   . 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 Upper gi endoscopy  08/24/2012    Procedure: UPPER GI ENDOSCOPY;  Surgeon: Gayland Curry, MD;  Location: [facility];  Service: General;;   Family History  Problem Relation Age of Onset  . Hypertension Mother   . Hyperlipidemia Father   . Hypertension Father   . Kidney disease Father   . Colon cancer Father 50    colon, met to liver  . Hypertension Maternal Grandmother   . Diabetes Maternal Grandfather   . Uterine cancer Maternal Grandmother 76   History  Substance Use Topics  . Smoking status: Never Smoker   . Smokeless tobacco: Never Used  . Alcohol Use: No     Comment: rare   OB History    Gravida Para Term Preterm AB TAB SAB Ectopic Multiple Living   2 2 2  0 0 0 0 0 0 2     Review of Systems  10 systems reviewed and found to be negative, except as noted in the HPI.   Allergies  Review of patient's allergies indicates no known allergies.  Home Medications   Prior to Admission medications   Medication Sig Start Date End Date Taking? Authorizing Provider  Acetaminophen (TYLENOL PO) Take 500 mg by mouth every 6 (six) hours as needed (pain).    Yes Historical Provider, MD  Calcium Citrate-Vitamin D (CALCIUM CITRATE + D PO) Take 1,200 mg by mouth daily.    Yes Historical  Provider, MD  clonazePAM (KLONOPIN) 0.5 MG tablet TAKE 1/2 TO 1 TABLET BY MOUTH TWICE DAILY AS NEEDED FOR ANXIETY Patient taking differently: TAKE 0.25 MG-0.5 MG BY MOUTH TWICE DAILY AS NEEDED FOR ANXIETY. 12/15/14  Yes Biagio Borg, MD  DULoxetine (CYMBALTA) 60 MG capsule Take 1 capsule (60 mg total) by mouth daily. 05/08/14  Yes Golden Circle, FNP  IRON PO Take 270 mg by mouth daily.    Yes Historical Provider, MD  Multiple Vitamin (MULTI-VITAMIN DAILY PO) Take 1 tablet by mouth 2 (two) times daily.    Yes Historical Provider, MD  Naproxen Sodium 220 MG CAPS Take 440 mg by mouth daily as needed (pain).    Yes Historical Provider, MD  ondansetron (ZOFRAN ODT) 4 MG  disintegrating tablet Take 1 tablet (4 mg total) by mouth every 8 (eight) hours as needed for nausea. 05/07/13  Yes Larene Pickett, PA-C  simethicone (MYLICON) 409 MG chewable tablet Chew 125 mg by mouth every 6 (six) hours as needed for flatulence.   Yes Historical Provider, MD  traZODone (DESYREL) 50 MG tablet TAKE 1/2 TO 1 TABLET BY MOUTH AT BEDTIME AS NEEDED FOR SLEEP. Patient taking differently: TAKE 25MG-50MG BY MOUTH AT BEDTIME AS NEEDED FOR SLEEP. 10/03/14  Yes Rowe Clack, MD  vitamin C (ASCORBIC ACID) 500 MG tablet Take 500 mg by mouth daily.   Yes Historical Provider, MD  zolpidem (AMBIEN) 10 MG tablet Take 1 tablet (10 mg total) by mouth at bedtime as needed. for sleep Patient taking differently: Take 10 mg by mouth at bedtime as needed for sleep.  12/14/14  Yes Rowe Clack, MD   BP 131/86 mmHg  Pulse 84  Temp(Src) 98 F (36.7 C) (Oral)  Resp 14  SpO2 100% Physical Exam  Constitutional: She is oriented to person, place, and time. She appears well-developed and well-nourished. No distress.  HENT:  Head: Normocephalic and atraumatic.  Mouth/Throat: Oropharynx is clear and moist.  Eyes: Conjunctivae and EOM are normal. Pupils are equal, round, and reactive to light.  Neck: Normal range of motion.  Cardiovascular: Normal rate, regular rhythm and intact distal pulses.   Pulmonary/Chest: Effort normal and breath sounds normal. No stridor.  Abdominal: Soft. There is no tenderness.  Mild, diffuse tenderness to palpation with no guarding or rebound.  Murphy sign negative, no tenderness to palpation over McBurney's point, Rovsings, Psoas and obturator all negative.   Musculoskeletal: Normal range of motion.  Neurological: She is alert and oriented to person, place, and time.  Skin: She is not diaphoretic.  Psychiatric: She has a normal mood and affect.  Nursing note and vitals reviewed.   ED Course  Procedures (including critical care time) Labs Review Labs  Reviewed  COMPREHENSIVE METABOLIC PANEL - Abnormal; Notable for the following:    Potassium 3.3 (*)    Calcium 8.7 (*)    Alkaline Phosphatase 152 (*)    All other components within normal limits  CBC - Abnormal; Notable for the following:    Hemoglobin 10.0 (*)    HCT 32.3 (*)    MCV 75.5 (*)    MCH 23.4 (*)    All other components within normal limits  URINALYSIS, ROUTINE W REFLEX MICROSCOPIC (NOT AT [facility]) - Abnormal; Notable for the following:    Color, Urine AMBER (*)    APPearance CLOUDY (*)    Bilirubin Urine SMALL (*)    All other components within normal limits  LIPASE, BLOOD  I-STAT BETA HCG  BLOOD, ED ([facility], [facility], [facility] ONLY)    Imaging Review No results found.   EKG Interpretation None      MDM   Final diagnoses:  None    Filed Vitals:   01/02/15 1337  BP: 131/86  Pulse: 84  Temp: 98 F (36.7 C)  TempSrc: Oral  Resp: 14  SpO2: 100%    Medications  morphine 4 MG/ML injection 4 mg (4 mg Intravenous Given 01/02/15 1510)  ondansetron (ZOFRAN) injection 4 mg (4 mg Intravenous Given 01/02/15 1510)    Aldona Bar D Fannin is a pleasant 29 y.o. female presenting with right upper quadrant and right flank pain with intermittent emesis onset 5 days ago. Abdominal  exam is nonsurgical, ultrasound performed by attending physician with no gallstones or wall thickening. Blood work without significant abnormality. Urinalysis not consistent with infection or stone. Repeat abdominal exam remains benign.   Considering patient's Roux-en-Y and no explanation for her abdominal pain and emesis, will obtain CT abdomen pelvis. Case signed out to PA lawyer at shift change: Plan is follow-up CT, sent home with Bentyl and antinausea medication.    Monico Blitz, PA-C 01/02/15 1559  Elnora Morrison, MD 01/05/15 [phone]

## 2015-01-02 NOTE — Discharge Instructions (Signed)
Follow-up with your primary care doctor.  Return here as needed.  The CT scan did not show any acute abnormality

## 2015-01-04 LAB — HEPATIC FUNCTION PANEL
ALT: 16 U/L (ref 7–35)
AST: 17 U/L (ref 13–35)
Alkaline Phosphatase: 141 U/L — AB (ref 25–125)
BILIRUBIN, TOTAL: 0.3 mg/dL

## 2015-01-04 LAB — CBC AND DIFFERENTIAL
HEMATOCRIT: 32 % — AB (ref 36–46)
HEMOGLOBIN: 10 g/dL — AB (ref 12.0–16.0)
Platelets: 259 10*3/uL (ref 150–399)
WBC: 6 10*3/mL

## 2015-01-04 LAB — BASIC METABOLIC PANEL
BUN: 15 mg/dL (ref 4–21)
Creatinine: 0.5 mg/dL (ref 0.5–1.1)
GLUCOSE: 77 mg/dL
Potassium: 3.9 mmol/L (ref 3.4–5.3)
SODIUM: 142 mmol/L (ref 137–147)

## 2015-02-01 ENCOUNTER — Other Ambulatory Visit: Payer: Self-pay | Admitting: Obstetrics and Gynecology

## 2015-02-09 ENCOUNTER — Encounter: Payer: Self-pay | Admitting: Internal Medicine

## 2015-02-15 ENCOUNTER — Telehealth: Payer: Self-pay | Admitting: Hematology

## 2015-02-15 NOTE — Telephone Encounter (Signed)
New patient appt-s/w patien and gave np appt for 09/08 with an arrival of 8:15 w/Dr. Candise Che.  Referring Dr. Gaynelle Adu  Dx-ida, unspecified ida.    Information scanned for review in system

## 2015-02-21 ENCOUNTER — Other Ambulatory Visit: Payer: Self-pay | Admitting: Internal Medicine

## 2015-02-22 ENCOUNTER — Ambulatory Visit (HOSPITAL_BASED_OUTPATIENT_CLINIC_OR_DEPARTMENT_OTHER): Payer: 59 | Admitting: Hematology

## 2015-02-22 ENCOUNTER — Encounter: Payer: Self-pay | Admitting: Hematology

## 2015-02-22 ENCOUNTER — Other Ambulatory Visit (HOSPITAL_BASED_OUTPATIENT_CLINIC_OR_DEPARTMENT_OTHER): Payer: 59

## 2015-02-22 ENCOUNTER — Telehealth: Payer: Self-pay | Admitting: Hematology

## 2015-02-22 VITALS — BP 119/85 | HR 83 | Temp 98.4°F | Resp 18 | Ht 67.0 in | Wt 197.3 lb

## 2015-02-22 DIAGNOSIS — E559 Vitamin D deficiency, unspecified: Secondary | ICD-10-CM

## 2015-02-22 DIAGNOSIS — D509 Iron deficiency anemia, unspecified: Secondary | ICD-10-CM

## 2015-02-22 DIAGNOSIS — D699 Hemorrhagic condition, unspecified: Secondary | ICD-10-CM

## 2015-02-22 DIAGNOSIS — N920 Excessive and frequent menstruation with regular cycle: Secondary | ICD-10-CM | POA: Diagnosis not present

## 2015-02-22 DIAGNOSIS — E538 Deficiency of other specified B group vitamins: Secondary | ICD-10-CM

## 2015-02-22 DIAGNOSIS — D508 Other iron deficiency anemias: Secondary | ICD-10-CM | POA: Insufficient documentation

## 2015-02-22 LAB — COMPREHENSIVE METABOLIC PANEL (CC13)
ALT: 15 U/L (ref 0–55)
ANION GAP: 9 meq/L (ref 3–11)
AST: 17 U/L (ref 5–34)
Albumin: 4.2 g/dL (ref 3.5–5.0)
Alkaline Phosphatase: 164 U/L — ABNORMAL HIGH (ref 40–150)
BUN: 10.1 mg/dL (ref 7.0–26.0)
CHLORIDE: 106 meq/L (ref 98–109)
CO2: 26 mEq/L (ref 22–29)
Calcium: 9.4 mg/dL (ref 8.4–10.4)
Creatinine: 0.7 mg/dL (ref 0.6–1.1)
Glucose: 81 mg/dl (ref 70–140)
Potassium: 3.7 mEq/L (ref 3.5–5.1)
Sodium: 142 mEq/L (ref 136–145)
Total Bilirubin: 0.47 mg/dL (ref 0.20–1.20)
Total Protein: 7.4 g/dL (ref 6.4–8.3)

## 2015-02-22 LAB — CBC & DIFF AND RETIC
BASO%: 0.5 % (ref 0.0–2.0)
Basophils Absolute: 0 10*3/uL (ref 0.0–0.1)
EOS%: 1 % (ref 0.0–7.0)
Eosinophils Absolute: 0.1 10*3/uL (ref 0.0–0.5)
HCT: 33.7 % — ABNORMAL LOW (ref 34.8–46.6)
HGB: 10.6 g/dL — ABNORMAL LOW (ref 11.6–15.9)
Immature Retic Fract: 8.1 % (ref 1.60–10.00)
LYMPH#: 1.5 10*3/uL (ref 0.9–3.3)
LYMPH%: 24.4 % (ref 14.0–49.7)
MCH: 23.1 pg — ABNORMAL LOW (ref 25.1–34.0)
MCHC: 31.5 g/dL (ref 31.5–36.0)
MCV: 73.6 fL — AB (ref 79.5–101.0)
MONO#: 0.5 10*3/uL (ref 0.1–0.9)
MONO%: 7.6 % (ref 0.0–14.0)
NEUT#: 4.2 10*3/uL (ref 1.5–6.5)
NEUT%: 66.5 % (ref 38.4–76.8)
PLATELETS: 327 10*3/uL (ref 145–400)
RBC: 4.58 10*6/uL (ref 3.70–5.45)
RDW: 15.3 % — ABNORMAL HIGH (ref 11.2–14.5)
RETIC CT ABS: 38.47 10*3/uL (ref 33.70–90.70)
Retic %: 0.84 % (ref 0.70–2.10)
WBC: 6.3 10*3/uL (ref 3.9–10.3)

## 2015-02-22 LAB — IRON AND TIBC CHCC
%SAT: 4 % — AB (ref 21–57)
IRON: 20 ug/dL — AB (ref 41–142)
TIBC: 443 ug/dL (ref 236–444)
UIBC: 423 ug/dL — AB (ref 120–384)

## 2015-02-22 LAB — PLATELET FUNCTION ASSAY
Collagen / Epinephrine: 130 seconds
PFA Interpretation: NORMAL

## 2015-02-22 LAB — CHCC SMEAR

## 2015-02-22 LAB — FERRITIN CHCC: FERRITIN: 5 ng/mL — AB (ref 9–269)

## 2015-02-22 NOTE — Progress Notes (Signed)
Marland Kitchen    HEMATOLOGY/ONCOLOGY CONSULTATION NOTE  Date of Service: 02/22/2015  Patient Care Team: Rowe Clack, MD as PCP - General Crawford Givens, MD as Referring Physician (Obstetrics and Gynecology) Bryson Ha Himmelrich, RD as Dietitian (Bariatrics)  CHIEF COMPLAINTS/PURPOSE OF CONSULTATION:  Iron deficiency anemia  HISTORY OF PRESENTING ILLNESS:  [name] [name] is a wonderful 29 y.o. female who has been referred to [place] by Dr Greer Pickerel MD and Dr.Valerie Asa Lente, MD for evaluation and management of iron deficiency anemia.  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  She presented to her bariatric surgery clinic with extreme fatigue and insomnia as well as some shortness of breath and feeling unwell.  She had a workup which showed a ferritin level of 4 and a iron saturation of 5% and a chronic microcytic anemia.  She was referred for evaluation and management of iron deficiency anemia.  She was on ferrous sulfate 1 tablet daily for a few months in 2015 and then has been on bariatric Iron supplementation 90 mg tablet with 30 mg of elemental iron daily.patient notes that she couldn't tolerate additional doses of iron due to constipation.  Patient endorses that she has had heavy periods despite using birth-control pills.  She has 2 kids and is planning to get an endometrial ablation to help mitigate significant blood loss due to heavy periods. She has also been on testosterone replacement to help with decreased libido. She notes that multiple female members on her mom's side of the family have had issues with heavy periods.no known obvious diagnoses bleeding disorder.  Patient notes that she has been on a proton pump inhibitor for a while and has been on them chronically from at least July 2016 due  to abdominal pain thought to be possibly related to an ulcer. She reports no overt melena, hematochezia or hematemesis.  Intermittent mild nosebleeds if her nose gets dry. No hematuria.  No hemoptysis.  No other overt source of blood loss.  Has not donated blood.  Patient is very keen to get IV iron since she feels very fatigued and wants to fix this as soon as possible.  MEDICAL HISTORY:  Past Medical History  Diagnosis Date  . OBESITY   . DEPRESSION   . Anxiety   . Infertility, female hx   . Anemia     after 1st delivery  . GERD (gastroesophageal reflux disease)     pregnancy related- no meds  . PCOS (polycystic ovarian syndrome)   . Morbid obesity     s/p RY 08/2012 - start weight 290#  . HYPOTHYROIDISM     hx of, normalized TSH during pregnanacy 2011  . Headache(784.0)     hx of migraines  . Pregnancy induced hypertension     gestational    SURGICAL HISTORY: Past Surgical History  Procedure Laterality Date  . Cesarean section      x2  . Tonsillectomy    . Wisdom tooth extraction    . Tubal ligation    . Lithotripsy Left   . 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 Upper gi endoscopy  08/24/2012    Procedure: UPPER GI ENDOSCOPY;  Surgeon: Gayland Curry, MD;  Location: [facility];  Service: General;;    SOCIAL HISTORY: Social History   Social History  . Marital Status: Married    Spouse Name: N/A  . Number of Children: N/A  . Years of Education: N/A   Occupational History  . Not on file.   Social History Main Topics  . Smoking status: Never Smoker   . Smokeless tobacco: Never Used  . Alcohol Use: No     Comment: rare/socially  . Drug Use: No  . Sexual Activity: Yes    Birth Control/ Protection: Other-see comments     Comment: tubal ligation   Other Topics Concern  . Not on file   Social  History Narrative   Married, lives with spouse and dtr born 07/2009. Employed as Occupational psychologist at [employer]: Family History  Problem Relation Age of Onset  . Hypertension Mother   . Hyperlipidemia Father   . Hypertension Father   . Kidney disease Father   . Colon cancer Father 1    colon, met to liver  . Hypertension Maternal Grandmother   . Diabetes Maternal Grandfather   . Uterine cancer Maternal Grandmother 76    ALLERGIES:  has No Known Allergies.  MEDICATIONS:  Current Outpatient Prescriptions  Medication Sig Dispense Refill  . pantoprazole (PROTONIX) 40 MG tablet Take 40 mg by mouth daily.    . polyethylene glycol (MIRALAX / GLYCOLAX) packet Take 17 g by mouth daily as needed.    . vitamin B-12 (CYANOCOBALAMIN) 1000 MCG tablet Take 5,000 mcg by mouth daily.    . Acetaminophen (TYLENOL PO) Take 500 mg by mouth every 6 (six) hours as needed (pain).     . Calcium Citrate-Vitamin D (CALCIUM CITRATE + D PO) Take 1,200 mg by mouth 2 (two) times daily.     . clonazePAM (KLONOPIN) 0.5 MG tablet Take 0.5-1 tablets (0.25-0.5 mg total) by mouth 2 (two) times daily as needed for anxiety. 30 tablet 5  . DULoxetine (CYMBALTA) 60 MG capsule Take 1 capsule (60 mg total) by mouth daily. 90 capsule 3  . Multiple Vitamin (MULTI-VITAMIN DAILY PO) Take 1 tablet by mouth 2 (two) times daily.     . ondansetron (ZOFRAN ODT) 4 MG disintegrating tablet Take 1 tablet (4 mg total) by mouth every 8 (eight) hours as needed for nausea. 10 tablet 0  . traZODone (DESYREL) 50 MG tablet Take 0.5-1 tablets (25-50 mg total) by mouth at bedtime as needed for sleep. 30 tablet 5  . vitamin C (ASCORBIC ACID) 500 MG tablet Take 500 mg by mouth daily.     No current facility-administered medications for this visit.    REVIEW OF SYSTEMS:    10 Point review of Systems was done is negative except as noted above.  PHYSICAL EXAMINATION: ECOG PERFORMANCE STATUS: 1 - Symptomatic but completely  ambulatory  . Filed Vitals:   02/22/15 0814  Height: 5' 7"  (1.702 m)  Weight: 197 lb 4.8 oz (89.495 kg)   Filed Weights   02/22/15 0814  Weight: 197 lb 4.8 oz (89.495 kg)   .Body mass index is 30.89 kg/(m^2).  GENERAL:alert, in no acute distress and comfortable SKIN: skin color, texture, turgor are normal, no rashes or significant lesions EYES: normal, conjunctiva are pink and non-injected, mild conjunctival pallor noted, sclera clear OROPHARYNX:no exudate, no erythema and lips, buccal mucosa, and tongue normal  NECK: supple, no JVD, thyroid normal size, non-tender, without nodularity LYMPH:  no palpable lymphadenopathy in the cervical, axillary  or inguinal LUNGS: clear to auscultation with normal respiratory effort HEART: regular rate & rhythm,  no murmurs and no lower extremity edema ABDOMEN: abdomen soft, non-tender, normoactive bowel sounds  Musculoskeletal: no cyanosis of digits and no clubbing  PSYCH: alert & oriented x 3 with fluent speech NEURO: no focal motor/sensory deficits  LABORATORY DATA:  I have reviewed the data as listed  . CBC Latest Ref Rng 02/22/2015 01/02/2015 08/22/2014  WBC 3.9 - 10.3 10e3/uL 6.3 6.9 6.9  Hemoglobin 11.6 - 15.9 g/dL 10.6(L) 10.0(L) 10.8(L)  Hematocrit 34.8 - 46.6 % 33.7(L) 32.3(L) 32.5(L)  Platelets 145 - 400 10e3/uL 327 249 258.0   . CBC    Component Value Date/Time   WBC 6.3 02/22/2015 1004   WBC 6.9 01/02/2015 1351   RBC 4.58 02/22/2015 1004   RBC 4.28 01/02/2015 1351   HGB 10.6* 02/22/2015 1004   HGB 10.0* 01/02/2015 1351   HCT 33.7* 02/22/2015 1004   HCT 32.3* 01/02/2015 1351   PLT 327 02/22/2015 1004   PLT 249 01/02/2015 1351   MCV 73.6* 02/22/2015 1004   MCV 75.5* 01/02/2015 1351   MCH 23.1* 02/22/2015 1004   MCH 23.4* 01/02/2015 1351   MCHC 31.5 02/22/2015 1004   MCHC 31.0 01/02/2015 1351   RDW 15.3* 02/22/2015 1004   RDW 15.5 01/02/2015 1351   LYMPHSABS 1.5 02/22/2015 1004   LYMPHSABS 2.1 08/22/2014 0831    MONOABS 0.5 02/22/2015 1004   MONOABS 0.5 08/22/2014 0831   EOSABS 0.1 02/22/2015 1004   EOSABS 0.1 08/22/2014 0831   BASOSABS 0.0 02/22/2015 1004   BASOSABS 0.0 08/22/2014 0831    . Lab Results  Component Value Date   IRON 20* 02/22/2015   TIBC 443 02/22/2015   IRONPCTSAT 4* 02/22/2015   (Iron and TIBC)  Lab Results  Component Value Date   FERRITIN 5* 02/22/2015   B12 : 1298  Vit D 25-OH: 21 . Lab Results  Component Value Date   RETICCTPCT 0.84 02/22/2015   RBC 4.58 02/22/2015   RETICCTABS 38.47 02/22/2015     . CMP Latest Ref Rng 02/22/2015 01/02/2015 08/22/2014  Glucose 70 - 140 mg/dl 81 92 83  BUN 7.0 - 26.0 mg/dL 10.1 18 13   Creatinine 0.6 - 1.1 mg/dL 0.7 0.59 0.58  Sodium 136 - 145 mEq/L 142 141 139  Potassium 3.5 - 5.1 mEq/L 3.7 3.3(L) 4.0  Chloride 101 - 111 mmol/L - 107 105  CO2 22 - 29 mEq/L 26 28 31   Calcium 8.4 - 10.4 mg/dL 9.4 8.7(L) 8.9  Total Protein 6.4 - 8.3 g/dL 7.4 6.9 6.4  Total Bilirubin 0.20 - 1.20 mg/dL 0.47 0.3 0.3  Alkaline Phos 40 - 150 U/L 164(H) 152(H) 140(H)  AST 5 - 34 U/L 17 23 15   ALT 0 - 55 U/L 15 21 16    Peripheral blood smear: Personally reviewed by me. Microcytic hypochromic red cells, no schistocytes, no micro-spherocytes, no blasts.  Platelets are adequate.  RADIOGRAPHIC STUDIES: I have personally reviewed the radiological images as listed and agreed with the findings in the report. No results found.  ASSESSMENT & PLAN:   29 year old female with  #1 microcytic anemia due to severe iron deficiency causing extreme fatigue.  Her iron deficiency is likely due to significant ongoing menorrhagia, for oral iron absorption due to her gastric bypass surgery and chronic PPI therapy.  No clinically overt evidence of GI bleeding. Patient has been on oral iron replacement without significant benefits.  Further escalation for PO iron replacement  will likely be be minimally useful and might not be tolerated due to GI  symptomatology. Patient is keen to have her iron replaced IV which is quite reasonable Plan  -I discussed with the patient the findings of significant iron deficiency causing microcytic anemia. -We talked about the pros, cons, benefits risks and alternatives to IV iron therapy. -Patient is keen to proceed with IV iron replacement and verbal informed consent was obtained in clinic. -We will schedule the patient to get IV Feraheme 549m IV piggyback Q weekly x3 doses for a total of about 1.5 g of IV iron. -if patient continues to become iron deficient despite controlling her menorrhagia might need to consider additional GI workup will defer this to her primary care physician. -continue B12 replacement as per your primary care physician  #2 vitamin D deficiency -Continue current Citracal plus vitamin D. -Given her headaches we will replace her vitamin D more aggressively and have prescribed ergocalciferol 50,000 units every weekly x8 doses.  Return visit here with Dr. KIrene Limboin 8 weeks with CBC, CMP, ferritin, iron profile, reticulocyte count  All of the patients questions were answered to her apparent satisfaction. The patient knows to call the clinic with any problems, questions or concerns.  I spent 40 minutes counseling the patient face to face. The total time spent in the appointment was 60 minutes and more than 50% was on counseling and direct patient cares.    GSullivan LoneMD MFalmanAAHIVMS SSutter Delta Medical CenterCMainegeneral Medical CenterHematology/Oncology Physician CChi St L[facility] (Office):       [phone](Work cell):  [phone](Fax):           [phone] 02/22/2015 8:40 AM

## 2015-02-22 NOTE — Telephone Encounter (Signed)
Gave and printed appt sched and avs fo rpt for SEpt adn NOV

## 2015-02-23 NOTE — Telephone Encounter (Signed)
Rx faxed to pharmacy  

## 2015-02-26 ENCOUNTER — Ambulatory Visit (HOSPITAL_BASED_OUTPATIENT_CLINIC_OR_DEPARTMENT_OTHER): Payer: 59

## 2015-02-26 VITALS — BP 127/75 | HR 93 | Temp 98.0°F | Resp 18

## 2015-02-26 DIAGNOSIS — D509 Iron deficiency anemia, unspecified: Secondary | ICD-10-CM

## 2015-02-26 LAB — PROTHROMBIN TIME
INR: 1.03 (ref <1.50)
Prothrombin Time: 13.6 seconds (ref 11.6–15.2)

## 2015-02-26 LAB — VITAMIN B12: Vitamin B-12: 1298 pg/mL — ABNORMAL HIGH (ref 211–911)

## 2015-02-26 LAB — APTT: aPTT: 34 seconds (ref 24–37)

## 2015-02-26 LAB — VITAMIN D 25 HYDROXY (VIT D DEFICIENCY, FRACTURES): Vit D, 25-Hydroxy: 21 ng/mL — ABNORMAL LOW (ref 30–100)

## 2015-02-26 LAB — VON WILLEBRAND PANEL
Coagulation Factor VIII: 133 % (ref 50–180)
Ristocetin Co-factor, Plasma: 91 % (ref 42–200)
Von Willebrand Antigen, Plasma: 104 % (ref 50–217)

## 2015-02-26 MED ORDER — SODIUM CHLORIDE 0.9 % IV SOLN
510.0000 mg | Freq: Once | INTRAVENOUS | Status: AC
  Administered 2015-02-26: 510 mg via INTRAVENOUS
  Filled 2015-02-26: qty 17

## 2015-02-26 MED ORDER — SODIUM CHLORIDE 0.9 % IV SOLN
Freq: Once | INTRAVENOUS | Status: AC
  Administered 2015-02-26: 10:00:00 via INTRAVENOUS

## 2015-02-26 NOTE — Patient Instructions (Signed)

## 2015-02-27 ENCOUNTER — Encounter: Payer: Self-pay | Admitting: Hematology

## 2015-02-27 NOTE — Progress Notes (Signed)
Contacted pt via phone to introduce myself and ask if she had any financial questions or concerns. Pt said she had none. Advised pt she could contact me should she have any.

## 2015-02-28 MED ORDER — ERGOCALCIFEROL 1.25 MG (50000 UT) PO CAPS: 50000.0000 [IU] | ORAL_CAPSULE | ORAL | Status: DC

## 2015-03-02 ENCOUNTER — Telehealth: Payer: Self-pay | Admitting: *Deleted

## 2015-03-02 ENCOUNTER — Other Ambulatory Visit: Payer: Self-pay | Admitting: *Deleted

## 2015-03-02 NOTE — Telephone Encounter (Signed)
Left patient a [facility]icemail [facility]. Patient will need another feraheme scheduled for 03/12/15 (POF placed). Patient prescription was sent in for Vitamin D2 due to low vitamin D counts. Awaiting patient call.

## 2015-03-05 ENCOUNTER — Ambulatory Visit (HOSPITAL_BASED_OUTPATIENT_CLINIC_OR_DEPARTMENT_OTHER): Payer: 59

## 2015-03-05 VITALS — BP 132/72 | HR 89 | Temp 98.4°F | Resp 18

## 2015-03-05 DIAGNOSIS — D509 Iron deficiency anemia, unspecified: Secondary | ICD-10-CM | POA: Diagnosis not present

## 2015-03-05 MED ORDER — FERUMOXYTOL INJECTION 510 MG/17 ML
510.0000 mg | Freq: Once | INTRAVENOUS | Status: AC
  Administered 2015-03-05: 510 mg via INTRAVENOUS
  Filled 2015-03-05: qty 17

## 2015-03-05 MED ORDER — SODIUM CHLORIDE 0.9 % IV SOLN
Freq: Once | INTRAVENOUS | Status: AC
  Administered 2015-03-05: 09:00:00 via INTRAVENOUS

## 2015-03-05 NOTE — Patient Instructions (Signed)

## 2015-03-12 ENCOUNTER — Ambulatory Visit (HOSPITAL_BASED_OUTPATIENT_CLINIC_OR_DEPARTMENT_OTHER): Payer: 59

## 2015-03-12 VITALS — BP 134/80 | HR 84 | Temp 98.1°F | Resp 17

## 2015-03-12 DIAGNOSIS — D509 Iron deficiency anemia, unspecified: Secondary | ICD-10-CM | POA: Diagnosis not present

## 2015-03-12 MED ORDER — SODIUM CHLORIDE 0.9 % IV SOLN
510.0000 mg | Freq: Once | INTRAVENOUS | Status: AC
  Administered 2015-03-12: 510 mg via INTRAVENOUS
  Filled 2015-03-12: qty 17

## 2015-03-12 MED ORDER — SODIUM CHLORIDE 0.9 % IV SOLN
Freq: Once | INTRAVENOUS | Status: AC
  Administered 2015-03-12: 08:00:00 via INTRAVENOUS

## 2015-03-12 NOTE — Patient Instructions (Signed)

## 2015-04-02 ENCOUNTER — Other Ambulatory Visit: Payer: Self-pay | Admitting: Internal Medicine

## 2015-04-18 ENCOUNTER — Other Ambulatory Visit: Payer: Self-pay | Admitting: *Deleted

## 2015-04-18 DIAGNOSIS — D509 Iron deficiency anemia, unspecified: Secondary | ICD-10-CM

## 2015-04-19 ENCOUNTER — Other Ambulatory Visit (HOSPITAL_BASED_OUTPATIENT_CLINIC_OR_DEPARTMENT_OTHER): Payer: 59

## 2015-04-19 ENCOUNTER — Ambulatory Visit (HOSPITAL_BASED_OUTPATIENT_CLINIC_OR_DEPARTMENT_OTHER): Payer: 59 | Admitting: Hematology

## 2015-04-19 ENCOUNTER — Telehealth: Payer: Self-pay | Admitting: Hematology

## 2015-04-19 ENCOUNTER — Encounter: Payer: Self-pay | Admitting: Hematology

## 2015-04-19 VITALS — BP 115/73 | HR 90 | Temp 98.0°F | Resp 18 | Ht 67.0 in | Wt 201.4 lb

## 2015-04-19 DIAGNOSIS — N921 Excessive and frequent menstruation with irregular cycle: Secondary | ICD-10-CM | POA: Diagnosis not present

## 2015-04-19 DIAGNOSIS — E559 Vitamin D deficiency, unspecified: Secondary | ICD-10-CM | POA: Diagnosis not present

## 2015-04-19 DIAGNOSIS — D509 Iron deficiency anemia, unspecified: Secondary | ICD-10-CM | POA: Diagnosis not present

## 2015-04-19 DIAGNOSIS — Z9884 Bariatric surgery status: Secondary | ICD-10-CM

## 2015-04-19 LAB — IRON AND TIBC CHCC
%SAT: 45 % (ref 21–57)
Iron: 122 ug/dL (ref 41–142)
TIBC: 274 ug/dL (ref 236–444)
UIBC: 151 ug/dL (ref 120–384)

## 2015-04-19 LAB — COMPREHENSIVE METABOLIC PANEL (CC13)
ALBUMIN: 4.1 g/dL (ref 3.5–5.0)
ALK PHOS: 134 U/L (ref 40–150)
ALT: 18 U/L (ref 0–55)
AST: 16 U/L (ref 5–34)
Anion Gap: 7 mEq/L (ref 3–11)
BUN: 13.4 mg/dL (ref 7.0–26.0)
CO2: 27 mEq/L (ref 22–29)
Calcium: 9.8 mg/dL (ref 8.4–10.4)
Chloride: 109 mEq/L (ref 98–109)
Creatinine: 0.7 mg/dL (ref 0.6–1.1)
EGFR: >90 mL/min/{1.73_m2} (ref >90)
GLUCOSE: 75 mg/dL (ref 70–140)
POTASSIUM: 3.8 meq/L (ref 3.5–5.1)
SODIUM: 143 meq/L (ref 136–145)
TOTAL PROTEIN: 7.1 g/dL (ref 6.4–8.3)
Total Bilirubin: <0.3 mg/dL (ref 0.20–1.20)

## 2015-04-19 LAB — CBC & DIFF AND RETIC
BASO%: 0.3 % (ref 0.0–2.0)
BASOS ABS: 0 10*3/uL (ref 0.0–0.1)
EOS ABS: 0.1 10*3/uL (ref 0.0–0.5)
EOS%: 1.2 % (ref 0.0–7.0)
HEMATOCRIT: 39.5 % (ref 34.8–46.6)
HEMOGLOBIN: 13.1 g/dL (ref 11.6–15.9)
IMMATURE RETIC FRACT: 2.3 % (ref 1.60–10.00)
LYMPH%: 24.1 % (ref 14.0–49.7)
MCH: 27.1 pg (ref 25.1–34.0)
MCHC: 33.2 g/dL (ref 31.5–36.0)
MCV: 81.8 fL (ref 79.5–101.0)
MONO#: 0.6 10*3/uL (ref 0.1–0.9)
MONO%: 9 % (ref 0.0–14.0)
NEUT%: 65.4 % (ref 38.4–76.8)
NEUTROS ABS: 4.3 10*3/uL (ref 1.5–6.5)
Platelets: 254 10*3/uL (ref 145–400)
RBC: 4.83 10*6/uL (ref 3.70–5.45)
RDW: 19.9 % — ABNORMAL HIGH (ref 11.2–14.5)
RETIC %: 1.89 % (ref 0.70–2.10)
Retic Ct Abs: 91.29 10*3/uL — ABNORMAL HIGH (ref 33.70–90.70)
WBC: 6.5 10*3/uL (ref 3.9–10.3)
lymph#: 1.6 10*3/uL (ref 0.9–3.3)

## 2015-04-19 LAB — FERRITIN CHCC: Ferritin: 205 ng/ml (ref 9–269)

## 2015-04-19 NOTE — Telephone Encounter (Signed)
Gave and printed appt sched and avs for pt for March 2017 °

## 2015-04-20 NOTE — Progress Notes (Signed)
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  Current Treatment:  Monitoring S/p IV feraheme  x 2 doses  INTERVAL HISTORY: [name] [name] is here for follow-up after having received her IV Feraheme about 6 weeks ago. She notes energy levels are much better. Hemoglobin has normalized to 13.1 from 10.6. Ferritin is normal. She notes that she still having very heavy periods lasting about 10 days. She notes that she does have follow-up with her GYN doctor in December with the consideration of possible endometrial ablation to address her menorrhagia. No other acute concerns at this time.  REVIEW OF SYSTEMS:    10 Point review of systems of done and is negative except as noted above.  . Past Medical History  Diagnosis Date  . OBESITY   . DEPRESSION   . Anxiety   . Infertility, female hx   . Anemia     after 1st delivery  . GERD (gastroesophageal reflux disease)     pregnancy related- no meds  . PCOS (polycystic ovarian syndrome)   . Morbid obesity (HCC)     s/p RY 08/2012 - start weight 290#  . HYPOTHYROIDISM     hx of, normalized TSH during pregnanacy 2011  . Headache(784.0)     hx of migraines  . Pregnancy induced hypertension     gestational    . Past Surgical History  Procedure Laterality Date  . Cesarean section      x2  . Tonsillectomy    . Wisdom tooth extraction    . Tubal ligation    . Lithotripsy Left   . 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 Upper gi endoscopy   08/24/2012    Procedure: UPPER GI ENDOSCOPY;  Surgeon: Atilano Ina, MD;  Location: [facility];  Service: General;;    . Social History  Substance Use Topics  . Smoking status: Never Smoker   . Smokeless tobacco: Never Used  . Alcohol Use: No     Comment: rare/socially    ALLERGIES:  has No Known Allergies.  MEDICATIONS:  Current Outpatient Prescriptions  Medication Sig Dispense Refill  . Acetaminophen (TYLENOL PO) Take 500 mg by mouth every 6 (six) hours as needed (pain).     . Calcium Citrate-Vitamin D (CALCIUM CITRATE + D PO) Take 1,200 mg by mouth 2 (two) times daily.     . clonazePAM (KLONOPIN) 0.5 MG tablet Take 0.5-1 tablets (0.25-0.5 mg total) by mouth 2 (two) times daily as needed for anxiety. 30 tablet 5  . DULoxetine (CYMBALTA) 60 MG capsule Take 1 capsule (60 mg total) by mouth daily. 90 capsule 3  . ergocalciferol (VITAMIN D2) 50000 UNITS capsule Take 1 capsule (50,000 Units total) by mouth once a week. 8 capsule 0  . ferrous sulfate 325 (65 FE) MG tablet Take 325 mg by mouth daily with breakfast.    . Multiple Vitamin (MULTI-VITAMIN DAILY PO) Take 1 tablet by mouth 2 (two) times daily.     . ondansetron (ZOFRAN ODT) 4 MG disintegrating tablet Take 1 tablet (4 mg  total) by mouth every 8 (eight) hours as needed for nausea. 10 tablet 0  . pantoprazole (PROTONIX) 40 MG tablet Take 40 mg by mouth daily.    . polyethylene glycol (MIRALAX / GLYCOLAX) packet Take 17 g by mouth daily as needed.    . traZODone (DESYREL) 50 MG tablet Take 0.5-1 tablets (25-50 mg total) by mouth at bedtime as needed for sleep. 30 tablet 5  . vitamin B-12 (CYANOCOBALAMIN) 1000 MCG tablet Take 5,000 mcg by mouth daily.    . vitamin C (ASCORBIC ACID) 500 MG tablet Take 500 mg by mouth daily.    Marland Kitchen zolpidem (AMBIEN) 10 MG tablet TAKE 1 TABLET BY MOUTH AS BEDTIME AS NEEDED FOR SLEEP 30 tablet 0   No current facility-administered medications for this visit.    PHYSICAL EXAMINATION: ECOG PERFORMANCE  STATUS: 1 - Symptomatic but completely ambulatory  . Filed Vitals:   04/19/15 0857  BP: 115/73  Pulse: 90  Temp: 98 F (36.7 C)  Resp: 18    Filed Weights   04/19/15 0857  Weight: 201 lb 6.4 oz (91.354 kg)   .Body mass index is 31.54 kg/(m^2).  GENERAL:alert, in no acute distress and comfortable SKIN: skin color, texture, turgor are normal, no rashes or significant lesions EYES: normal, conjunctiva are pink and non-injected, sclera clear OROPHARYNX:no exudate, no erythema and lips, buccal mucosa, and tongue normal  NECK: supple, no JVD, thyroid normal size, non-tender, without nodularity LYMPH:  no palpable lymphadenopathy in the cervical, axillary or inguinal LUNGS: clear to auscultation with normal respiratory effort HEART: regular rate & rhythm,  no murmurs and no lower extremity edema ABDOMEN: abdomen soft, non-tender, normoactive bowel sounds  Musculoskeletal: no cyanosis of digits and no clubbing  PSYCH: alert & oriented x 3 with fluent speech NEURO: no focal motor/sensory deficits  LABORATORY DATA:   I have reviewed the data as listed  . CBC Latest Ref Rng 04/19/2015 02/22/2015 01/02/2015  WBC 3.9 - 10.3 10e3/uL 6.5 6.3 6.9  Hemoglobin 11.6 - 15.9 g/dL 45.4 10.6(L) 10.0(L)  Hematocrit 34.8 - 46.6 % 39.5 33.7(L) 32.3(L)  Platelets 145 - 400 10e3/uL 254 327 249    . CMP Latest Ref Rng 04/19/2015 02/22/2015 01/02/2015  Glucose 70 - 140 mg/dl 75 81 92  BUN 7.0 - 09.8 mg/dL 11.9 14.7 18  Creatinine 0.6 - 1.1 mg/dL 0.7 0.7 8.29  Sodium 562 - 145 mEq/L 143 142 141  Potassium 3.5 - 5.1 mEq/L 3.8 3.7 3.3(L)  Chloride 101 - 111 mmol/L - - 107  CO2 22 - 29 mEq/L Calcium 8.4 - 10.4 mg/dL 9.8 9.4 1.3(Y)  Total Protein 6.4 - 8.3 g/dL 7.1 7.4 6.9  Total Bilirubin 0.20 - 1.20 mg/dL <8.65 7.84 0.3  Alkaline Phos 40 - 150 U/L 134 164(H) 152(H)  AST 5 - 34 U/L ALT 0 - 55 U/L . Lab Results  Component Value Date   IRON 122 04/19/2015   TIBC 274  04/19/2015   IRONPCTSAT 45 04/19/2015   (Iron and TIBC)  Lab Results  Component Value Date   FERRITIN 205 04/19/2015    RADIOGRAPHIC STUDIES: I have personally reviewed the radiological images as listed and agreed with the findings in the report. No results found.  ASSESSMENT & PLAN:   29 year old female with  #1 Microcytic anemia due to severe iron deficiency causing extreme fatigue. Her iron deficiency is likely due to significant ongoing menorrhagia, poor oral iron  absorption due to her gastric bypass surgery and chronic PPI therapy. No clinically overt evidence of GI bleeding. Patient's anemia has resolved with IV iron therapy. Iron labs look good. She still continues to have ongoing menorrhagia which might again the time deficiency  Plan -Good response to IV iron. -No indication for additional IV iron at this time. -Patient has a plan to follow-up with her GYN doctor to consider endometrial ablation for menorrhagia management. -if patient continues to become iron deficient despite controlling her menorrhagia might need to consider additional GI workup will defer this to her primary care physician. -continue B12 replacement as per your primary care physician  #2 vitamin D deficiency -Continue current Citracal plus vitamin D  Return visit here with Dr. Candise CheKale in 16 weeks with CBC, CMP, ferritin, iron profile, reticulocyte count  I spent 15 minutes counseling the patient face to face. The total time spent in the appointment was 20 minutes and more than 50% was on counseling and direct patient cares.    Wyvonnia LoraGautam Kale MD [name]IV[facility] Hematology/Oncology Physician [facility]  (Office):       [phone] (Work cell):  [phone] (Fax):           [phone]

## 2015-04-25 ENCOUNTER — Encounter: Payer: Self-pay | Admitting: Internal Medicine

## 2015-05-23 ENCOUNTER — Other Ambulatory Visit: Payer: Self-pay | Admitting: Internal Medicine

## 2015-05-28 ENCOUNTER — Encounter: Payer: Self-pay | Admitting: Family

## 2015-05-28 ENCOUNTER — Ambulatory Visit (INDEPENDENT_AMBULATORY_CARE_PROVIDER_SITE_OTHER): Payer: 59 | Admitting: Family

## 2015-05-28 DIAGNOSIS — F419 Anxiety disorder, unspecified: Secondary | ICD-10-CM

## 2015-05-28 DIAGNOSIS — G479 Sleep disorder, unspecified: Secondary | ICD-10-CM | POA: Diagnosis not present

## 2015-05-28 DIAGNOSIS — F418 Other specified anxiety disorders: Secondary | ICD-10-CM

## 2015-05-28 DIAGNOSIS — F329 Major depressive disorder, single episode, unspecified: Secondary | ICD-10-CM

## 2015-05-28 DIAGNOSIS — F32A Depression, unspecified: Secondary | ICD-10-CM

## 2015-05-28 DIAGNOSIS — R41840 Attention and concentration deficit: Secondary | ICD-10-CM

## 2015-05-28 MED ORDER — DULOXETINE HCL 30 MG PO CPEP: 30.0000 mg | ORAL_CAPSULE | Freq: Every day | ORAL | Status: DC

## 2015-05-28 MED ORDER — CLONAZEPAM 0.5 MG PO TABS: ORAL_TABLET | ORAL | Status: DC

## 2015-05-28 NOTE — Patient Instructions (Signed)
Thank you for choosing [facility].  Summary/Instructions:  Your prescription(s) have been submitted to your pharmacy or been printed and provided for you. Please take as directed and contact our office if you believe you are having problem(s) with the medication(s) or have any questions.  If your symptoms worsen or fail to improve, please contact our office for further instruction, or in case of emergency go directly to the emergency room at the closest medical facility.     

## 2015-05-28 NOTE — Assessment & Plan Note (Signed)
Most likely related to anxiety/depression. Continue current dosage of trazodone. Increase clonazepam to 1-2 tablets nightly as needed to help with sleep. 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

## 2015-05-28 NOTE — Assessment & Plan Note (Signed)
Symptoms consistent with exacerbation of depression and anxiety. Denies suicidal ideations. Discussed stress management stress relief. Increase Cymbalta to 90 mg daily. Change clonazepam to daily as needed for anxiety and increase as needed for sleep at night. Follow-up in one month or sooner if symptoms worsen or do not improve..Marland Kitchen

## 2015-05-28 NOTE — Progress Notes (Signed)
Pre visit review using our clinic review tool, if applicable. No additional management support is needed unless otherwise documented below in the visit note. 

## 2015-05-28 NOTE — Progress Notes (Signed)
Subjective:    Patient ID: [name] [name], female    DOB: 25-Mar-1986, 29 y.o.   MRN: [id number]  Chief Complaint  Patient presents with  . Anxiety    insomnia and anxiety are getting worse, can not concentrate her house is a wreck and says it makes depression worse when she looks at it    HPI:  [name] [name] is a 29 y.o. female who  has a past medical history of OBESITY; DEPRESSION; Anxiety; Infertility, female (hx ); Anemia; GERD (gastroesophageal reflux disease); PCOS (polycystic ovarian syndrome); Morbid obesity (HCC); HYPOTHYROIDISM; Headache(784.0); and Pregnancy induced hypertension. and presents today for an acute office visit.  1.) Depression and anxiety - currently maintained on Cymbalta and clonazepam. Takes the medication as prescribed and denies adverse side effects. Denies suicidal ideation. Symptoms are currently affecting her lifestyle and describes her house as being a disaster and has decreased motivation and drive. She also notes that she gets anxious being out and about doing regular activities. Notes the Cymbalta does help with her symptoms and notes she can tell when she misses a dose. Does not believe the clonazepam is not helping very much.    2.) Insomnia - Currently maintained on clonazepam and trazadone as needed. Takes the medication as prescribed and notes increase levels of insomnia recently. Denies adverse side effects. Averages about 4-5 hours of sleep per night. She falls sleep and then finds herself waking up for several hours.   3.) Decreased attention span - Associated symptom of decreased attention and inability to focus and complete tasks has been going on for several years. Daughter was recently diagnosed with ADD and questions if this could be an underlying cause of her symptoms.   No Known Allergies   Current Outpatient Prescriptions on File Prior to Visit  Medication Sig Dispense Refill  . Acetaminophen (TYLENOL PO) Take 500 mg by mouth  every 6 (six) hours as needed (pain).     . Calcium Citrate-Vitamin D (CALCIUM CITRATE + D PO) Take 1,200 mg by mouth 2 (two) times daily.     . DULoxetine (CYMBALTA) 60 MG capsule Take 1 capsule (60 mg total) by mouth daily. 90 capsule 3  . ergocalciferol (VITAMIN D2) 50000 UNITS capsule Take 1 capsule (50,000 Units total) by mouth once a week. 8 capsule 0  . ferrous sulfate 325 (65 FE) MG tablet Take 325 mg by mouth daily with breakfast.    . Multiple Vitamin (MULTI-VITAMIN DAILY PO) Take 1 tablet by mouth 2 (two) times daily.     . ondansetron (ZOFRAN ODT) 4 MG disintegrating tablet Take 1 tablet (4 mg total) by mouth every 8 (eight) hours as needed for nausea. 10 tablet 0  . pantoprazole (PROTONIX) 40 MG tablet Take 40 mg by mouth daily.    . polyethylene glycol (MIRALAX / GLYCOLAX) packet Take 17 g by mouth daily as needed.    . traZODone (DESYREL) 50 MG tablet Take 0.5-1 tablets (25-50 mg total) by mouth at bedtime as needed for sleep. 30 tablet 5  . vitamin B-12 (CYANOCOBALAMIN) 1000 MCG tablet Take 5,000 mcg by mouth daily.    . vitamin C (ASCORBIC ACID) 500 MG tablet Take 500 mg by mouth daily.    Marland Kitchen zolpidem (AMBIEN) 10 MG tablet Take 1 tablet (10 mg total) by mouth at bedtime as needed for sleep. 30 tablet 2   No current facility-administered medications on file prior to visit.    Past Medical History  Diagnosis Date  .  OBESITY   . DEPRESSION   . Anxiety   . Infertility, female hx   . Anemia     after 1st delivery  . GERD (gastroesophageal reflux disease)     pregnancy related- no meds  . PCOS (polycystic ovarian syndrome)   . Morbid obesity (HCC)     s/p RY 08/2012 - start weight 290#  . HYPOTHYROIDISM     hx of, normalized TSH during pregnanacy 2011  . Headache(784.0)     hx of migraines  . Pregnancy induced hypertension     gestational    Past Surgical History  Procedure Laterality Date  . Cesarean section      x2  . Tonsillectomy    . Wisdom tooth extraction      . Tubal ligation    . Lithotripsy Left   . 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 Upper gi endoscopy  08/24/2012    Procedure: UPPER GI ENDOSCOPY;  Surgeon: Atilano InaEric M Wilson, MD;  Location: [facility];  Service: General;;      Review of Systems  Constitutional: Negative for fever and diaphoresis.  Respiratory: Negative for chest tightness and shortness of breath.   Cardiovascular: Negative for chest pain, palpitations and leg swelling.  Neurological: Negative for headaches.  Psychiatric/Behavioral: Positive for sleep disturbance, dysphoric mood and decreased concentration. Negative for suicidal ideas. The patient is nervous/anxious.       Objective:    BP 108/64 mmHg  Pulse 83  Temp(Src) 97.6 F (36.4 C) (Oral)  Resp 16  Ht 5\' 7"  (1.702 m)  Wt 201 lb 12.8 oz (91.536 kg)  BMI 31.60 kg/m2  SpO2 99% Nursing note and vital signs reviewed.  Physical Exam  Constitutional: She is oriented to person, place, and time. She appears well-developed and well-nourished. No distress.  Cardiovascular: Normal rate, regular rhythm, normal heart sounds and intact distal pulses.   Pulmonary/Chest: Effort normal and breath sounds normal.  Neurological: She is alert and oriented to person, place, and time.  Skin: Skin is warm and dry.  Psychiatric: Her behavior is normal. Judgment and thought content normal. Her mood appears anxious. She exhibits a depressed mood.       Assessment & Plan:   Problem List Items Addressed This Visit      Other   Anxiety and depression    Symptoms consistent with exacerbation of depression and anxiety. Denies suicidal ideations. Discussed stress management stress relief. Increase Cymbalta to 90 mg daily. Change clonazepam to daily as needed for anxiety and increase as needed for sleep at night. Follow-up  in one month or sooner if symptoms worsen or do not improve..      Relevant Medications   clonazePAM (KLONOPIN) 0.5 MG tablet   DULoxetine (CYMBALTA) 30 MG capsule   Decreased attention Span    Symptoms of decreased attention and inability to complete tasks with concern for ADD/ADHD. Refer to psychology for formal testing. Follow-up pending results.      Relevant Orders   Ambulatory referral to Psychology   Sleep disturbance    Most likely related to anxiety/depression. Continue current dosage of trazodone. Increase clonazepam to 1-2 tablets nightly as needed to help with sleep. 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      Relevant Medications   clonazePAM (KLONOPIN) 0.5 MG tablet

## 2015-05-28 NOTE — Assessment & Plan Note (Signed)
Symptoms of decreased attention and inability to complete tasks with concern for ADD/ADHD. Refer to psychology for formal testing. Follow-up pending results.

## 2015-06-13 ENCOUNTER — Other Ambulatory Visit: Payer: Self-pay | Admitting: Nurse Practitioner

## 2015-06-20 MED FILL — ZOLPIDEM TARTRATE 10 MG TAB: 10 | 30 days supply | Qty: 30 | Fill #1

## 2015-06-28 ENCOUNTER — Ambulatory Visit (INDEPENDENT_AMBULATORY_CARE_PROVIDER_SITE_OTHER): Payer: 59 | Admitting: Family

## 2015-06-28 ENCOUNTER — Encounter: Payer: Self-pay | Admitting: Family

## 2015-06-28 VITALS — BP 118/78 | HR 87 | Temp 98.0°F | Resp 18 | Ht 67.0 in | Wt 191.4 lb

## 2015-06-28 DIAGNOSIS — G479 Sleep disorder, unspecified: Secondary | ICD-10-CM

## 2015-06-28 DIAGNOSIS — F418 Other specified anxiety disorders: Secondary | ICD-10-CM | POA: Diagnosis not present

## 2015-06-28 DIAGNOSIS — F419 Anxiety disorder, unspecified: Principal | ICD-10-CM

## 2015-06-28 DIAGNOSIS — F329 Major depressive disorder, single episode, unspecified: Secondary | ICD-10-CM

## 2015-06-28 DIAGNOSIS — F32A Depression, unspecified: Secondary | ICD-10-CM

## 2015-06-28 MED ORDER — DULOXETINE HCL 30 MG PO CPEP: 30.0000 mg | ORAL_CAPSULE | Freq: Every day | ORAL | Status: DC

## 2015-06-28 MED ORDER — CLONAZEPAM 0.5 MG PO TABS: ORAL_TABLET | ORAL | Status: DC

## 2015-06-28 MED ORDER — DULOXETINE HCL 60 MG PO CPEP: 60.0000 mg | ORAL_CAPSULE | Freq: Every day | ORAL | Status: DC

## 2015-06-28 MED FILL — HYDROCODON-APAP 5-325: 5-325 | 5 days supply | Qty: 20 | Fill #0

## 2015-06-28 MED FILL — diazePAM 10 MG TABS: 10 | 1 days supply | Qty: 1 | Fill #0

## 2015-06-28 NOTE — Assessment & Plan Note (Signed)
Stable with current medication regimen and averaging 6-8 hours of quality sleep per night. Continue current dosages of trazodone and Ambien. Follow up if symptoms worsen or are no longer controlled.

## 2015-06-28 NOTE — Progress Notes (Signed)
Subjective:    Patient ID: [name] [name], female    DOB: 1985/12/01, 30 y.o.   MRN: [id number]  Chief Complaint  Patient presents with  . Follow-up    states that the changes made to cymbalta and klonopin have helped alot and she is doing alot better with depression    HPI:  [name] [name] is a 30 y.o. female who  has a past medical history of OBESITY; DEPRESSION; Anxiety; Infertility, female (hx ); Anemia; GERD (gastroesophageal reflux disease); PCOS (polycystic ovarian syndrome); Morbid obesity (HCC); HYPOTHYROIDISM; Headache(784.0); and Pregnancy induced hypertension. and presents today for a follow up office visit.   1.) Anxiety and depression -  Previously seen in the office for anxiety and depression and adjusted her Cymbalta and clonazepam. Takes the medication as prescribed and denies adverse side effects. Reports that the changes in medication have improved her mood and reports that the anxiety and depression are much better controlled. She has also changed her eating behaviors and has lost 10 pounds. Has been taking the clonazepam mostly at night.   Wt Readings from Last 3 Encounters:  06/28/15 191 lb 6.4 oz (86.818 kg)  05/28/15 201 lb 12.8 oz (91.536 kg)  04/19/15 201 lb 6.4 oz (91.354 kg)    2.) Sleep disturbance - Currently maintained on trazadone and clonazepam and notes significant improvements and is average between 6-8 hours of sleep.    No Known Allergies   Current Outpatient Prescriptions on File Prior to Visit  Medication Sig Dispense Refill  . Acetaminophen (TYLENOL PO) Take 500 mg by mouth every 6 (six) hours as needed (pain).     . Calcium Citrate-Vitamin D (CALCIUM CITRATE + D PO) Take 1,200 mg by mouth 2 (two) times daily.     . ferrous sulfate 325 (65 FE) MG tablet Take 325 mg by mouth daily with breakfast.    . Multiple Vitamin (MULTI-VITAMIN DAILY PO) Take 1 tablet by mouth 2 (two) times daily.     . ondansetron (ZOFRAN ODT) 4 MG  disintegrating tablet Take 1 tablet (4 mg total) by mouth every 8 (eight) hours as needed for nausea. 10 tablet 0  . pantoprazole (PROTONIX) 40 MG tablet Take 40 mg by mouth daily.    . polyethylene glycol (MIRALAX / GLYCOLAX) packet Take 17 g by mouth daily as needed.    . traZODone (DESYREL) 50 MG tablet Take 0.5-1 tablets (25-50 mg total) by mouth at bedtime as needed for sleep. 30 tablet 5  . vitamin B-12 (CYANOCOBALAMIN) 1000 MCG tablet Take 5,000 mcg by mouth daily.    . vitamin C (ASCORBIC ACID) 500 MG tablet Take 500 mg by mouth daily.    Marland Kitchen zolpidem (AMBIEN) 10 MG tablet Take 1 tablet (10 mg total) by mouth at bedtime as needed for sleep. 30 tablet 2   No current facility-administered medications on file prior to visit.    Review of Systems  Constitutional: Negative for fever and chills.  Psychiatric/Behavioral: Negative for suicidal ideas, behavioral problems, sleep disturbance, dysphoric mood and decreased concentration. The patient is not nervous/anxious.       Objective:    BP 118/78 mmHg  Pulse 87  Temp(Src) 98 F (36.7 C) (Oral)  Resp 18  Ht 5\' 7"  (1.702 m)  Wt 191 lb 6.4 oz (86.818 kg)  BMI 29.97 kg/m2  SpO2 99% Nursing note and vital signs reviewed.  Physical Exam  Constitutional: She is oriented to person, place, and time. She appears well-developed and well-nourished.  No distress.  Cardiovascular: Normal rate, regular rhythm, normal heart sounds and intact distal pulses.   Pulmonary/Chest: Effort normal and breath sounds normal.  Neurological: She is alert and oriented to person, place, and time.  Skin: Skin is warm and dry.  Psychiatric: She has a normal mood and affect. Her behavior is normal. Judgment and thought content normal.       Assessment & Plan:   Problem List Items Addressed This Visit      Other   Anxiety and depression - Primary    Anxiety and depression with improved control with current medication regimen and stable mood. Denies adverse  side effects. Has lost 10 pounds since last month. Denies suicidal ideations. Continue current dosage of duloxetine and clonazepam. Follow-up in 3 months or sooner if needed.      Relevant Medications   DULoxetine (CYMBALTA) 30 MG capsule   DULoxetine (CYMBALTA) 60 MG capsule   clonazePAM (KLONOPIN) 0.5 MG tablet   Sleep disturbance    Stable with current medication regimen and averaging 6-8 hours of quality sleep per night. Continue current dosages of trazodone and Ambien. Follow up if symptoms worsen or are no longer controlled.       Relevant Medications   clonazePAM (KLONOPIN) 0.5 MG tablet

## 2015-06-28 NOTE — Patient Instructions (Addendum)
Thank you for choosing [facility].  Summary/Instructions:  Your prescription(s) have been submitted to your pharmacy or been printed and provided for you. Please take as directed and contact our office if you believe you are having problem(s) with the medication(s) or have any questions.  If your symptoms worsen or fail to improve, please contact our office for further instruction, or in case of emergency go directly to the emergency room at the closest medical facility.   Please continue to take your medications as prescribed.  

## 2015-06-28 NOTE — Progress Notes (Signed)
Pre visit review using our clinic review tool, if applicable. No additional management support is needed unless otherwise documented below in the visit note. 

## 2015-06-28 NOTE — Assessment & Plan Note (Signed)
Anxiety and depression with improved control with current medication regimen and stable mood. Denies adverse side effects. Has lost 10 pounds since last month. Denies suicidal ideations. Continue current dosage of duloxetine and clonazepam. Follow-up in 3 months or sooner if needed.

## 2015-07-02 ENCOUNTER — Telehealth: Payer: Self-pay | Admitting: Surgery

## 2015-07-02 NOTE — Telephone Encounter (Signed)
[name] had a RYGB by Dr. Andrey CampanileWilson 08/24/2012.  She was seen by him in July 2016 for epigastric pain.    She was doing well until the last week, when she has had some increasing epigastric pain.  The pain got worse tonight.  It is unclear on the phone if this is a marginal ulcer.  But she takes Protonix daily.  I told her to double the dose and she has some Carafate still at the house, so she is going to start this.  She'll call the office in the morning to follow up with Dr. Andrey CampanileWilson.  Ovidio Kinavid Emilya Justen, MD, [facility] Pager: [phone] Office phone:  [phone]

## 2015-07-04 DIAGNOSIS — R1013 Epigastric pain: Secondary | ICD-10-CM | POA: Diagnosis not present

## 2015-07-05 DIAGNOSIS — Z01818 Encounter for other preprocedural examination: Secondary | ICD-10-CM | POA: Diagnosis not present

## 2015-07-05 DIAGNOSIS — N920 Excessive and frequent menstruation with regular cycle: Secondary | ICD-10-CM | POA: Diagnosis not present

## 2015-07-09 ENCOUNTER — Ambulatory Visit: Payer: 59 | Admitting: Psychology

## 2015-07-17 MED FILL — clonazePAM 0.5 MG TABS: 0.5 | 15 days supply | Qty: 30 | Fill #3

## 2015-07-17 MED FILL — traZODone HCL 50 MG TABS: 50 | 30 days supply | Qty: 30 | Fill #4

## 2015-07-18 MED FILL — ZOLPIDEM TARTRATE 10 MG TAB: 10 | 30 days supply | Qty: 30 | Fill #2

## 2015-07-26 DIAGNOSIS — Z09 Encounter for follow-up examination after completed treatment for conditions other than malignant neoplasm: Secondary | ICD-10-CM | POA: Diagnosis not present

## 2015-08-13 ENCOUNTER — Encounter: Payer: Self-pay | Admitting: Family

## 2015-08-13 ENCOUNTER — Encounter: Payer: 59 | Admitting: Internal Medicine

## 2015-08-13 ENCOUNTER — Ambulatory Visit (INDEPENDENT_AMBULATORY_CARE_PROVIDER_SITE_OTHER): Payer: 59 | Admitting: Family

## 2015-08-13 VITALS — BP 124/78 | HR 88 | Temp 97.7°F | Resp 16 | Ht 67.0 in | Wt 196.8 lb

## 2015-08-13 DIAGNOSIS — Z0000 Encounter for general adult medical examination without abnormal findings: Secondary | ICD-10-CM

## 2015-08-13 MED ORDER — ZOLPIDEM TARTRATE 10 MG PO TABS: 10.0000 mg | ORAL_TABLET | Freq: Every evening | ORAL | Status: DC | PRN

## 2015-08-13 NOTE — Progress Notes (Signed)
Subjective:    Patient ID: [name] [name], female    DOB: Jan 09, 1986, 30 y.o.   MRN: [id number]  Chief Complaint  Patient presents with  . CPE    not fasting    HPI:  [name] [name] is a 30 y.o. female who presents today for an annual wellness visit.   1) Health Maintenance -   Diet - Averages about 3 meals per day consisting of proteins, fruits, vegetables, startches and occasional vegetables. Multiple cups of caffeine daily.   Exercise - No structured exercise.    2) Preventative Exams / Immunizations:  Dental -- Up to date  Vision -- Up to date.    Health Maintenance  Topic Date Due  . PAP SMEAR  12/15/2015  . INFLUENZA VACCINE  01/15/2016  . TETANUS/TDAP  06/16/2016  . HIV Screening  Completed    Immunization History  Administered Date(s) Administered  . Influenza Split 03/17/2011  . Influenza,inj,Quad PF,36+ Mos 03/16/2013  . Influenza-Unspecified 03/16/2014  . Td 06/16/2006    No Known Allergies   Outpatient Prescriptions Prior to Visit  Medication Sig Dispense Refill  . Acetaminophen (TYLENOL PO) Take 500 mg by mouth every 6 (six) hours as needed (pain).     . Calcium Citrate-Vitamin D (CALCIUM CITRATE + D PO) Take 1,200 mg by mouth 2 (two) times daily.     . clonazePAM (KLONOPIN) 0.5 MG tablet Take 0.5-1 tablet daily by mouth as needed for anxiety and 1-2 tablets nightly by mouth as needed for sleep 30 tablet 0  . DULoxetine (CYMBALTA) 30 MG capsule Take 1 capsule (30 mg total) by mouth daily. 90 capsule 0  . DULoxetine (CYMBALTA) 60 MG capsule Take 1 capsule (60 mg total) by mouth daily. 90 capsule 0  . ferrous sulfate 325 (65 FE) MG tablet Take 325 mg by mouth daily with breakfast.    . Multiple Vitamin (MULTI-VITAMIN DAILY PO) Take 1 tablet by mouth 2 (two) times daily.     . ondansetron (ZOFRAN ODT) 4 MG disintegrating tablet Take 1 tablet (4 mg total) by mouth every 8 (eight) hours as needed for nausea. 10 tablet 0  . pantoprazole  (PROTONIX) 40 MG tablet Take 40 mg by mouth daily.    . polyethylene glycol (MIRALAX / GLYCOLAX) packet Take 17 g by mouth daily as needed.    . traZODone (DESYREL) 50 MG tablet Take 0.5-1 tablets (25-50 mg total) by mouth at bedtime as needed for sleep. 30 tablet 5  . vitamin B-12 (CYANOCOBALAMIN) 1000 MCG tablet Take 5,000 mcg by mouth daily.    . vitamin C (ASCORBIC ACID) 500 MG tablet Take 500 mg by mouth daily.    Marland Kitchen zolpidem (AMBIEN) 10 MG tablet Take 1 tablet (10 mg total) by mouth at bedtime as needed for sleep. 30 tablet 2   No facility-administered medications prior to visit.     Past Medical History  Diagnosis Date  . OBESITY   . DEPRESSION   . Anxiety   . Infertility, female hx   . Anemia     after 1st delivery  . GERD (gastroesophageal reflux disease)     pregnancy related- no meds  . PCOS (polycystic ovarian syndrome)   . Morbid obesity ([place])     s/p RY 08/2012 - start weight 290#  . HYPOTHYROIDISM     hx of, normalized TSH during pregnanacy 2011  . Headache(784.0)     hx of migraines  . Pregnancy induced hypertension  gestational     Past Surgical History  Procedure Laterality Date  . Cesarean section      x2  . Tonsillectomy    . Wisdom tooth extraction    . Tubal ligation    . Lithotripsy Left   . 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 Upper gi endoscopy  08/24/2012    Procedure: UPPER GI ENDOSCOPY;  Surgeon: Gayland Curry, MD;  Location: [facility];  Service: General;;     Family History  Problem Relation Age of Onset  . Hypertension Mother   . Hyperlipidemia Father   . Hypertension Father   . Kidney disease Father   . Colon cancer Father 66    colon, met to liver  . Hypertension Maternal Grandmother   . Diabetes Maternal Grandfather   . Uterine cancer Maternal Grandmother 76      Social History   Social History  . Marital Status: Married    Spouse Name: N/A  . Number of Children: N/A  . Years of Education: N/A   Occupational History  . Not on file.   Social History Main Topics  . Smoking status: Never Smoker   . Smokeless tobacco: Never Used  . Alcohol Use: No     Comment: rare/socially  . Drug Use: No  . Sexual Activity: Yes    Birth Control/ Protection: Other-see comments     Comment: tubal ligation   Other Topics Concern  . Not on file   Social History Narrative   Married, lives with spouse and dtr born 07/2009. Employed as Occupational psychologist at [employer]: Denies fever, chills, fatigue, or significant weight gain/loss. HENT: Head: Denies headache or neck pain Ears: Denies changes in hearing, ringing in ears, earache, drainage Nose: Denies discharge, stuffiness, itching, nosebleed, sinus pain Throat: Denies sore throat, hoarseness, dry mouth, sores, thrush Eyes: Denies loss/changes in vision, pain, redness, blurry/double vision, flashing lights Cardiovascular: Denies chest pain/discomfort, tightness, palpitations, shortness of breath with activity, difficulty lying down, swelling, sudden awakening with shortness of breath Respiratory: Denies shortness of breath, cough, sputum production, wheezing Gastrointestinal: Denies dysphasia, heartburn, change in appetite, nausea, change in bowel habits, rectal bleeding, constipation, diarrhea, yellow skin or eyes Genitourinary: Denies frequency, urgency, burning/pain, blood in urine, incontinence, change in urinary strength. Musculoskeletal: Denies muscle/joint pain, stiffness, back pain, redness or swelling of joints, trauma Skin: Denies rashes, lumps, itching, dryness, color changes, or hair/nail changes Neurological: Denies dizziness, fainting, seizures, weakness, numbness, tingling, tremor Psychiatric - Denies nervousness, stress, depression or memory loss Endocrine:  Denies heat or cold intolerance, sweating, frequent urination, excessive thirst, changes in appetite Hematologic: Denies ease of bruising or bleeding     Objective:     BP 124/78 mmHg  Pulse 88  Temp(Src) 97.7 F (36.5 C) (Oral)  Resp 16  Ht 5' 7" (1.702 m)  Wt 196 lb 12.8 oz (89.268 kg)  BMI 30.82 kg/m2  SpO2 98% Nursing note and vital signs reviewed.  Physical Exam  Constitutional: She is oriented to person, place, and time. She appears well-developed and well-nourished.  HENT:  Head: Normocephalic.  Right Ear: Hearing, tympanic membrane, external ear and ear canal normal.  Left Ear: Hearing, tympanic membrane, external ear and ear canal normal.  Nose: Nose normal.  Mouth/Throat: Uvula is midline, oropharynx is clear and  moist and mucous membranes are normal.  Eyes: Conjunctivae and EOM are normal. Pupils are equal, round, and reactive to light.  Neck: Neck supple. No JVD present. No tracheal deviation present. No thyromegaly present.  Cardiovascular: Normal rate, regular rhythm, normal heart sounds and intact distal pulses.   Pulmonary/Chest: Effort normal and breath sounds normal.  Abdominal: Soft. Bowel sounds are normal. She exhibits no distension and no mass. There is no tenderness. There is no rebound and no guarding.  Musculoskeletal: Normal range of motion. She exhibits no edema or tenderness.  Lymphadenopathy:    She has no cervical adenopathy.  Neurological: She is alert and oriented to person, place, and time. She has normal reflexes. No cranial nerve deficit. She exhibits normal muscle tone. Coordination normal.  Skin: Skin is warm and dry.  Psychiatric: She has a normal mood and affect. Her behavior is normal. Judgment and thought content normal.       Assessment & Plan:   Problem List Items Addressed This Visit      Other   Routine general medical examination at a health care facility - Primary    1) Anticipatory Guidance: Discussed importance of wearing  a seatbelt while driving and not texting while driving; changing batteries in smoke detector at least once annually; wearing suntan lotion when outside; eating a balanced and moderate diet; getting physical activity at least 30 minutes per day.  2) Immunizations / Screenings / Labs:  All immunizations are up-to-date per recommendations. Due for a Pap smear in July of this year which will be scheduled with gynecology. All other screenings are up to date per recommendations. Obtain CBC, CMET, Lipid profile and TSH.   Overall well exam with minimal risk factors for cardiovascular disease with the exception of obesity and sedentary lifestyle. Discussed importance of physical activity with goal of 30 minutes of moderate level activity daily. Goal weight loss of 5-10% of current body weight. Encouraged nutritional intake that is adequate, balanced, varied and focuses on nutrient dense foods and is low in saturated fats and processed/sugary foods. Continue other healthy lifestyle behaviors and choices. Follow-up prevention exam in 1 year. Follow-up office visit pending blood work and chronic conditions as needed.       Relevant Orders   Comprehensive metabolic panel   Lipid panel   TSH

## 2015-08-13 NOTE — Assessment & Plan Note (Signed)
1) Anticipatory Guidance: Discussed importance of wearing a seatbelt while driving and not texting while driving; changing batteries in smoke detector at least once annually; wearing suntan lotion when outside; eating a balanced and moderate diet; getting physical activity at least 30 minutes per day.  2) Immunizations / Screenings / Labs:  All immunizations are up-to-date per recommendations. Due for a Pap smear in July of this year which will be scheduled with gynecology. All other screenings are up to date per recommendations. Obtain CBC, CMET, Lipid profile and TSH.   Overall well exam with minimal risk factors for cardiovascular disease with the exception of obesity and sedentary lifestyle. Discussed importance of physical activity with goal of 30 minutes of moderate level activity daily. Goal weight loss of 5-10% of current body weight. Encouraged nutritional intake that is adequate, balanced, varied and focuses on nutrient dense foods and is low in saturated fats and processed/sugary foods. Continue other healthy lifestyle behaviors and choices. Follow-up prevention exam in 1 year. Follow-up office visit pending blood work and chronic conditions as needed.

## 2015-08-13 NOTE — Progress Notes (Signed)
Pre visit review using our clinic review tool, if applicable. No additional management support is needed unless otherwise documented below in the visit note. 

## 2015-08-13 NOTE — Patient Instructions (Addendum)
Thank you for choosing [facility].  Summary/Instructions:  Your prescription(s) have been submitted to your pharmacy or been printed and provided for you. Please take as directed and contact our office if you believe you are having problem(s) with the medication(s) or have any questions.  Please stop by the lab on the basement level of the building for your blood work. Your results will be released to MyChart (or called to you) after review, usually within 72 hours after test completion. If any changes need to be made, you will be notified at that same time.  Health Maintenance, Female Adopting a healthy lifestyle and getting preventive care can go a long way to promote health and wellness. Talk with your health care provider about what schedule of regular examinations is right for you. This is a good chance for you to check in with your provider about disease prevention and staying healthy. In between checkups, there are plenty of things you can do on your own. Experts have done a lot of research about which lifestyle changes and preventive measures are most likely to keep you healthy. Ask your health care provider for more information. WEIGHT AND DIET  Eat a healthy diet  Be sure to include plenty of vegetables, fruits, low-fat dairy products, and lean protein.  Do not eat a lot of foods high in solid fats, added sugars, or salt.  Get regular exercise. This is one of the most important things you can do for your health.  Most adults should exercise for at least 150 minutes each week. The exercise should increase your heart rate and make you sweat (moderate-intensity exercise).  Most adults should also do strengthening exercises at least twice a week. This is in addition to the moderate-intensity exercise.  Maintain a healthy weight  Body mass index (BMI) is a measurement that can be used to identify possible weight problems. It estimates body fat based on height and weight. Your  health care provider can help determine your BMI and help you achieve or maintain a healthy weight.  For females 20 years of age and older:   A BMI below 18.5 is considered underweight.  A BMI of 18.5 to 24.9 is normal.  A BMI of 25 to 29.9 is considered overweight.  A BMI of 30 and above is considered obese.  Watch levels of cholesterol and blood lipids  You should start having your blood tested for lipids and cholesterol at 32 years of age, then have this test every 5 years.  You may need to have your cholesterol levels checked more often if:  Your lipid or cholesterol levels are high.  You are older than 32 years of age.  You are at high risk for heart disease.  CANCER SCREENING   Lung Cancer  Lung cancer screening is recommended for adults 55-80 years old who are at high risk for lung cancer because of a history of smoking.  A yearly low-dose CT scan of the lungs is recommended for people who:  Currently smoke.  Have quit within the past 15 years.  Have at least a 30-pack-year history of smoking. A pack year is smoking an average of one pack of cigarettes a day for 1 year.  Yearly screening should continue until it has been 15 years since you quit.  Yearly screening should stop if you develop a health problem that would prevent you from having lung cancer treatment.  Breast Cancer  Practice breast self-awareness. This means understanding how your breasts normally   appear and feel.  It also means doing regular breast self-exams. Let your health care provider know about any changes, no matter how small.  If you are in your 20s or 30s, you should have a clinical breast exam (CBE) by a health care provider every 1-3 years as part of a regular health exam.  If you are 40 or older, have a CBE every year. Also consider having a breast X-ray (mammogram) every year.  If you have a family history of breast cancer, talk to your health care provider about genetic  screening.  If you are at high risk for breast cancer, talk to your health care provider about having an MRI and a mammogram every year.  Breast cancer gene (BRCA) assessment is recommended for women who have family members with BRCA-related cancers. BRCA-related cancers include:  Breast.  Ovarian.  Tubal.  Peritoneal cancers.  Results of the assessment will determine the need for genetic counseling and BRCA1 and BRCA2 testing. Cervical Cancer Your health care provider may recommend that you be screened regularly for cancer of the pelvic organs (ovaries, uterus, and vagina). This screening involves a pelvic examination, including checking for microscopic changes to the surface of your cervix (Pap test). You may be encouraged to have this screening done every 3 years, beginning at age 21.  For women ages 30-65, health care providers may recommend pelvic exams and Pap testing every 3 years, or they may recommend the Pap and pelvic exam, combined with testing for human papilloma virus (HPV), every 5 years. Some types of HPV increase your risk of cervical cancer. Testing for HPV may also be done on women of any age with unclear Pap test results.  Other health care providers may not recommend any screening for nonpregnant women who are considered low risk for pelvic cancer and who do not have symptoms. Ask your health care provider if a screening pelvic exam is right for you.  If you have had past treatment for cervical cancer or a condition that could lead to cancer, you need Pap tests and screening for cancer for at least 20 years after your treatment. If Pap tests have been discontinued, your risk factors (such as having a new sexual partner) need to be reassessed to determine if screening should resume. Some women have medical problems that increase the chance of getting cervical cancer. In these cases, your health care provider may recommend more frequent screening and Pap tests. Colorectal  Cancer  This type of cancer can be detected and often prevented.  Routine colorectal cancer screening usually begins at 32 years of age and continues through 32 years of age.  Your health care provider may recommend screening at an earlier age if you have risk factors for colon cancer.  Your health care provider may also recommend using home test kits to check for hidden blood in the stool.  A small camera at the end of a tube can be used to examine your colon directly (sigmoidoscopy or colonoscopy). This is done to check for the earliest forms of colorectal cancer.  Routine screening usually begins at age 50.  Direct examination of the colon should be repeated every 5-10 years through 32 years of age. However, you may need to be screened more often if early forms of precancerous polyps or small growths are found. Skin Cancer  Check your skin from head to toe regularly.  Tell your health care provider about any new moles or changes in moles, especially if there is   a change in a mole's shape or color.  Also tell your health care provider if you have a mole that is larger than the size of a pencil eraser.  Always use sunscreen. Apply sunscreen liberally and repeatedly throughout the day.  Protect yourself by wearing long sleeves, pants, a wide-brimmed hat, and sunglasses whenever you are outside. HEART DISEASE, DIABETES, AND HIGH BLOOD PRESSURE   High blood pressure causes heart disease and increases the risk of stroke. High blood pressure is more likely to develop in:  People who have blood pressure in the high end of the normal range (130-139/85-89 mm Hg).  People who are overweight or obese.  People who are African American.  If you are 18-39 years of age, have your blood pressure checked every 3-5 years. If you are 40 years of age or older, have your blood pressure checked every year. You should have your blood pressure measured twice--once when you are at a hospital or clinic,  and once when you are not at a hospital or clinic. Record the average of the two measurements. To check your blood pressure when you are not at a hospital or clinic, you can use:  An automated blood pressure machine at a pharmacy.  A home blood pressure monitor.  If you are between 55 years and 79 years old, ask your health care provider if you should take aspirin to prevent strokes.  Have regular diabetes screenings. This involves taking a blood sample to check your fasting blood sugar level.  If you are at a normal weight and have a low risk for diabetes, have this test once every three years after 32 years of age.  If you are overweight and have a high risk for diabetes, consider being tested at a younger age or more often. PREVENTING INFECTION  Hepatitis B  If you have a higher risk for hepatitis B, you should be screened for this virus. You are considered at high risk for hepatitis B if:  You were born in a country where hepatitis B is common. Ask your health care provider which countries are considered high risk.  Your parents were born in a high-risk country, and you have not been immunized against hepatitis B (hepatitis B vaccine).  You have HIV or AIDS.  You use needles to inject street drugs.  You live with someone who has hepatitis B.  You have had sex with someone who has hepatitis B.  You get hemodialysis treatment.  You take certain medicines for conditions, including cancer, organ transplantation, and autoimmune conditions. Hepatitis C  Blood testing is recommended for:  Everyone born from 1945 through 1965.  Anyone with known risk factors for hepatitis C. Sexually transmitted infections (STIs)  You should be screened for sexually transmitted infections (STIs) including gonorrhea and chlamydia if:  You are sexually active and are younger than 32 years of age.  You are older than 32 years of age and your health care provider tells you that you are at risk  for this type of infection.  Your sexual activity has changed since you were last screened and you are at an increased risk for chlamydia or gonorrhea. Ask your health care provider if you are at risk.  If you do not have HIV, but are at risk, it may be recommended that you take a prescription medicine daily to prevent HIV infection. This is called pre-exposure prophylaxis (PrEP). You are considered at risk if:  You are sexually active and do not regularly   use condoms or know the HIV status of your partner(s).  You take drugs by injection.  You are sexually active with a partner who has HIV. Talk with your health care provider about whether you are at high risk of being infected with HIV. If you choose to begin PrEP, you should first be tested for HIV. You should then be tested every 3 months for as long as you are taking PrEP.  PREGNANCY   If you are premenopausal and you may become pregnant, ask your health care provider about preconception counseling.  If you may become pregnant, take 400 to 800 micrograms (mcg) of folic acid every day.  If you want to prevent pregnancy, talk to your health care provider about birth control (contraception). OSTEOPOROSIS AND MENOPAUSE   Osteoporosis is a disease in which the bones lose minerals and strength with aging. This can result in serious bone fractures. Your risk for osteoporosis can be identified using a bone density scan.  If you are 65 years of age or older, or if you are at risk for osteoporosis and fractures, ask your health care provider if you should be screened.  Ask your health care provider whether you should take a calcium or vitamin D supplement to lower your risk for osteoporosis.  Menopause may have certain physical symptoms and risks.  Hormone replacement therapy may reduce some of these symptoms and risks. Talk to your health care provider about whether hormone replacement therapy is right for you.  HOME CARE INSTRUCTIONS    Schedule regular health, dental, and eye exams.  Stay current with your immunizations.   Do not use any tobacco products including cigarettes, chewing tobacco, or electronic cigarettes.  If you are pregnant, do not drink alcohol.  If you are breastfeeding, limit how much and how often you drink alcohol.  Limit alcohol intake to no more than 1 drink per day for nonpregnant women. One drink equals 12 ounces of beer, 5 ounces of wine, or 1 ounces of hard liquor.  Do not use street drugs.  Do not share needles.  Ask your health care provider for help if you need support or information about quitting drugs.  Tell your health care provider if you often feel depressed.  Tell your health care provider if you have ever been abused or do not feel safe at home.   This information is not intended to replace advice given to you by your health care provider. Make sure you discuss any questions you have with your health care provider.   Document Released: 12/16/2010 Document Revised: 06/23/2014 Document Reviewed: 05/04/2013 Elsevier Interactive Patient Education 2016 [employer]   

## 2015-08-15 MED FILL — traZODone HCL 50 MG TABS: 50 | 30 days supply | Qty: 30 | Fill #5

## 2015-08-15 MED FILL — clonazePAM 0.5 MG TABS: 0.5 | 15 days supply | Qty: 30 | Fill #4

## 2015-08-16 ENCOUNTER — Other Ambulatory Visit: Payer: Self-pay | Admitting: Family

## 2015-08-16 ENCOUNTER — Encounter: Payer: Self-pay | Admitting: Hematology

## 2015-08-16 ENCOUNTER — Ambulatory Visit (HOSPITAL_BASED_OUTPATIENT_CLINIC_OR_DEPARTMENT_OTHER): Payer: 59 | Admitting: Hematology

## 2015-08-16 ENCOUNTER — Other Ambulatory Visit (HOSPITAL_BASED_OUTPATIENT_CLINIC_OR_DEPARTMENT_OTHER): Payer: 59

## 2015-08-16 ENCOUNTER — Telehealth: Payer: Self-pay | Admitting: Hematology

## 2015-08-16 VITALS — BP 128/81 | HR 81 | Temp 98.3°F | Resp 18 | Wt 195.8 lb

## 2015-08-16 DIAGNOSIS — D509 Iron deficiency anemia, unspecified: Secondary | ICD-10-CM

## 2015-08-16 DIAGNOSIS — E559 Vitamin D deficiency, unspecified: Secondary | ICD-10-CM | POA: Diagnosis not present

## 2015-08-16 DIAGNOSIS — Z0000 Encounter for general adult medical examination without abnormal findings: Secondary | ICD-10-CM | POA: Diagnosis not present

## 2015-08-16 LAB — CBC & DIFF AND RETIC
BASO%: 0.3 % (ref 0.0–2.0)
Basophils Absolute: 0 10*3/uL (ref 0.0–0.1)
EOS%: 0.8 % (ref 0.0–7.0)
Eosinophils Absolute: 0 10*3/uL (ref 0.0–0.5)
HCT: 36.9 % (ref 34.8–46.6)
HGB: 12.7 g/dL (ref 11.6–15.9)
IMMATURE RETIC FRACT: 3.7 % (ref 1.60–10.00)
LYMPH%: 40.5 % (ref 14.0–49.7)
MCH: 30.5 pg (ref 25.1–34.0)
MCHC: 34.4 g/dL (ref 31.5–36.0)
MCV: 88.7 fL (ref 79.5–101.0)
MONO#: 0.2 10*3/uL (ref 0.1–0.9)
MONO%: 6 % (ref 0.0–14.0)
NEUT#: 2 10*3/uL (ref 1.5–6.5)
NEUT%: 52.4 % (ref 38.4–76.8)
PLATELETS: 187 10*3/uL (ref 145–400)
RBC: 4.16 10*6/uL (ref 3.70–5.45)
RDW: 12.6 % (ref 11.2–14.5)
Retic %: 1.41 % (ref 0.70–2.10)
Retic Ct Abs: 58.66 10*3/uL (ref 33.70–90.70)
WBC: 3.8 10*3/uL — ABNORMAL LOW (ref 3.9–10.3)
lymph#: 1.6 10*3/uL (ref 0.9–3.3)

## 2015-08-16 LAB — COMPREHENSIVE METABOLIC PANEL
ALT: 15 U/L (ref 0–55)
ANION GAP: 7 meq/L (ref 3–11)
AST: 17 U/L (ref 5–34)
Albumin: 3.8 g/dL (ref 3.5–5.0)
Alkaline Phosphatase: 110 U/L (ref 40–150)
BILIRUBIN TOTAL: 0.3 mg/dL (ref 0.20–1.20)
BUN: 13 mg/dL (ref 7.0–26.0)
CALCIUM: 8.9 mg/dL (ref 8.4–10.4)
CO2: 29 meq/L (ref 22–29)
Chloride: 108 mEq/L (ref 98–109)
Creatinine: 0.7 mg/dL (ref 0.6–1.1)
Glucose: 78 mg/dl (ref 70–140)
Potassium: 3.6 mEq/L (ref 3.5–5.1)
Sodium: 143 mEq/L (ref 136–145)
TOTAL PROTEIN: 6.6 g/dL (ref 6.4–8.3)

## 2015-08-16 LAB — FERRITIN: Ferritin: 56 ng/ml (ref 9–269)

## 2015-08-16 LAB — IRON AND TIBC
%SAT: 27 % (ref 21–57)
IRON: 69 ug/dL (ref 41–142)
TIBC: 253 ug/dL (ref 236–444)
UIBC: 184 ug/dL (ref 120–384)

## 2015-08-16 MED FILL — ZOLPIDEM TARTRATE 10 MG TAB: 10 | 30 days supply | Qty: 30 | Fill #0

## 2015-08-16 NOTE — Telephone Encounter (Signed)
per por pof to sch pt appt-gave pt copy of avs

## 2015-08-16 NOTE — Progress Notes (Signed)
Marland Kitchen  HEMATOLOGY ONCOLOGY PROGRESS NOTE  Date of service: .08/16/2015   Patient Care Team: Veryl Speak, FNP as PCP - General (Family Medicine) Jaymes Graff, MD as Referring Physician (Obstetrics and Gynecology) Loree Fee Himmelrich, RD as Dietitian 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  Current Treatment:  PO Ferrous sulfate 1 tab po BID  S/p IV feraheme  x 2 doses   INTERVAL HISTORY: Ms Neldon Mc is here for her scheduled follow-up rind deficiency anemia. She notes that she is feeling well. She did have an endometrial ablation on 07/05/2015 after which her periods have been very light. She is glad that it helped with her menorrhagia. Continues to take ferrous sulfate 1 tablet by mouth twice a day with mild constipation which is ameliorated with MiraLAX. No other acute new concerns. Notes that she has recently started her [employer], [employer] company.  REVIEW OF SYSTEMS:    10 Point review of systems of done and is negative except as noted above.  . Past Medical History  Diagnosis Date  . OBESITY   . DEPRESSION   . Anxiety   . Infertility, female hx   . Anemia     after 1st delivery  . GERD (gastroesophageal reflux disease)     pregnancy related- no meds  . PCOS (polycystic ovarian syndrome)   . Morbid obesity (HCC)     s/p RY 08/2012 - start weight 290#  . HYPOTHYROIDISM     hx of, normalized TSH during pregnanacy 2011  . Headache(784.0)     hx of migraines  . Pregnancy induced hypertension     gestational    . Past Surgical History  Procedure Laterality Date  . Cesarean section      x2  . Tonsillectomy    . Wisdom tooth extraction    . Tubal ligation    . Lithotripsy Left   . 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 Upper gi endoscopy  08/24/2012    Procedure: UPPER GI ENDOSCOPY;  Surgeon: Atilano Ina, MD;  Location: [facility];  Service: General;;    . Social History  Substance Use Topics  . Smoking status: Never Smoker   . Smokeless tobacco: Never Used  . Alcohol Use: No     Comment: rare/socially    ALLERGIES:  has No Known Allergies.  MEDICATIONS:  Current Outpatient Prescriptions  Medication Sig Dispense Refill  . Acetaminophen (TYLENOL PO) Take 500 mg by mouth every 6 (six) hours as needed (pain).     . Calcium Citrate-Vitamin D (CALCIUM CITRATE + D PO) Take 1,200 mg by mouth 2 (two) times daily.     . clonazePAM (KLONOPIN) 0.5 MG tablet Take 0.5-1 tablet daily by mouth as needed for anxiety and 1-2 tablets nightly by mouth as needed for sleep 30 tablet 0  . DULoxetine (CYMBALTA) 30 MG capsule Take 1 capsule (30 mg total) by mouth daily. 90 capsule 0  . DULoxetine (CYMBALTA) 60 MG capsule Take 1 capsule (60 mg total) by mouth daily. 90 capsule 0  . ferrous sulfate 325 (65 FE) MG tablet Take 325 mg by mouth daily with breakfast.    . Multiple Vitamin (MULTI-VITAMIN DAILY PO) Take 1 tablet by mouth 2 (two) times daily.     Marland Kitchen  ondansetron (ZOFRAN ODT) 4 MG disintegrating tablet Take 1 tablet (4 mg total) by mouth every 8 (eight) hours as needed for nausea. 10 tablet 0  . pantoprazole (PROTONIX) 40 MG tablet Take 40 mg by mouth daily.    . polyethylene glycol (MIRALAX / GLYCOLAX) packet Take 17 g by mouth daily as needed.    . traZODone (DESYREL) 50 MG tablet Take 0.5-1 tablets (25-50 mg total) by mouth at bedtime as needed for sleep. 30 tablet 5  . vitamin B-12 (CYANOCOBALAMIN) 1000 MCG tablet Take 5,000 mcg by mouth daily.    . vitamin C (ASCORBIC ACID) 500 MG tablet Take 500 mg by mouth daily.    Marland Kitchen zolpidem (AMBIEN) 10 MG tablet Take 1 tablet (10 mg total) by mouth at bedtime as needed for sleep. 30 tablet 1   No current facility-administered medications for this  visit.    PHYSICAL EXAMINATION: ECOG PERFORMANCE STATUS: 1 - Symptomatic but completely ambulatory  . Filed Vitals:   08/16/15 0909  BP: 128/81  Pulse: 81  Temp: 98.3 F (36.8 C)  Resp: 18    Filed Weights   08/16/15 0909  Weight: 195 lb 12.8 oz (88.814 kg)   .Body mass index is 30.66 kg/(m^2).  GENERAL:alert, in no acute distress and comfortable SKIN: skin color, texture, turgor are normal, no rashes or significant lesions EYES: normal, conjunctiva are pink and non-injected, sclera clear OROPHARYNX:no exudate, no erythema and lips, buccal mucosa, and tongue normal  NECK: supple, no JVD, thyroid normal size, non-tender, without nodularity LYMPH:  no palpable lymphadenopathy in the cervical, axillary or inguinal LUNGS: clear to auscultation with normal respiratory effort HEART: regular rate & rhythm,  no murmurs and no lower extremity edema ABDOMEN: abdomen soft, non-tender, normoactive bowel sounds  Musculoskeletal: no cyanosis of digits and no clubbing  PSYCH: alert & oriented x 3 with fluent speech NEURO: no focal motor/sensory deficits  LABORATORY DATA:   I have reviewed the data as listed  . CBC Latest Ref Rng 08/16/2015 04/19/2015 02/22/2015  WBC 3.9 - 10.3 10e3/uL 3.8(L) 6.5 6.3  Hemoglobin 11.6 - 15.9 g/dL 62.1 30.8 10.6(L)  Hematocrit 34.8 - 46.6 % 36.9 39.5 33.7(L)  Platelets 145 - 400 10e3/uL 187 254 327    . CMP Latest Ref Rng 04/19/2015 02/22/2015 01/04/2015  Glucose 70 - 140 mg/dl 75 81 -  BUN 7.0 - 65.7 mg/dL 84.6 96.2 15  Creatinine 0.6 - 1.1 mg/dL 0.7 0.7 0.5  Sodium 952 - 145 mEq/L 143 142 142  Potassium 3.5 - 5.1 mEq/L 3.8 3.7 3.9  Chloride 101 - 111 mmol/L - - -  CO2 22 - 29 mEq/L 27 26 -  Calcium 8.4 - 10.4 mg/dL 9.8 9.4 -  Total Protein 6.4 - 8.3 g/dL 7.1 7.4 -  Total Bilirubin 0.20 - 1.20 mg/dL <8.41 3.24 -  Alkaline Phos 40 - 150 U/L 134 164(H) 141(A)  AST 5 - 34 U/L ALT 0 - 55 U/L RADIOGRAPHIC STUDIES: I have  personally reviewed the radiological images as listed and agreed with the findings in the report. No results found.  ASSESSMENT & PLAN:   30 year old female with  #1 Microcytic anemia due to severe iron deficiency causing extreme fatigue. Her iron deficiency is likely due to significant ongoing menorrhagia, poor oral iron absorption due to her gastric bypass surgery and chronic PPI therapy. No clinically overt evidence of GI bleeding. Patient's anemia has responded well to IV iron.  She has since been seen by GYN and had an endometrial ablation on 07/05/2015 which has led to resolution of her menorrhagia.  Her CBC is normal with normocytic red cells and normal RDW suggesting no significant iron deficiency at this time. Iron labs and CMP are currently pending but anticipated D should look okay. Plan -No clear indication for IV iron at this time. -Would continue oral ferrous sulfate 1 tablet by mouth twice a day. -If her ferritin levels are more than 100 at this time might reduce maintenance oral iron to 1 tablet by mouth daily. -We'll likely need some oral iron replacement long-term due to her gastric bypass surgery and chronic PPI use. -continue B12 replacement as per your primary care physician  #2 vitamin D deficiency -Continue current Citracal plus vitamin D  Return visit here with Dr. Candise Che in 6 months with CBC, CMP, ferritin, iron profile, reticulocyte count  I spent 15 minutes counseling the patient face to face. The total time spent in the appointment was 20 minutes and more than 50% was on counseling and direct patient cares.    Wyvonnia Lora MD MS AAHIVMS [facility] Hematology/Oncology Physician [facility]  (Office):       [phone] (Work cell):  [phone] (Fax):           [phone]

## 2015-08-20 ENCOUNTER — Encounter: Payer: Self-pay | Admitting: Family

## 2015-08-20 LAB — TSH: TSH: 1.54 u[IU]/mL (ref 0.450–4.500)

## 2015-08-20 LAB — LIPID PANEL W/O CHOL/HDL RATIO
Cholesterol, Total: 165 mg/dL (ref 100–199)
HDL: 61 mg/dL (ref >39)
LDL CALC: 88 mg/dL (ref 0–99)
TRIGLYCERIDES: 82 mg/dL (ref 0–149)
VLDL CHOLESTEROL CAL: 16 mg/dL (ref 5–40)

## 2015-08-27 ENCOUNTER — Ambulatory Visit (INDEPENDENT_AMBULATORY_CARE_PROVIDER_SITE_OTHER): Payer: 59

## 2015-08-27 ENCOUNTER — Ambulatory Visit (INDEPENDENT_AMBULATORY_CARE_PROVIDER_SITE_OTHER): Payer: 59 | Admitting: Podiatry

## 2015-08-27 ENCOUNTER — Encounter: Payer: Self-pay | Admitting: Podiatry

## 2015-08-27 VITALS — BP 117/78 | HR 83 | Resp 16 | Ht 64.0 in | Wt 293.0 lb

## 2015-08-27 DIAGNOSIS — M79673 Pain in unspecified foot: Secondary | ICD-10-CM

## 2015-08-27 DIAGNOSIS — M779 Enthesopathy, unspecified: Secondary | ICD-10-CM

## 2015-08-27 DIAGNOSIS — M2010 Hallux valgus (acquired), unspecified foot: Secondary | ICD-10-CM | POA: Diagnosis not present

## 2015-08-27 NOTE — Progress Notes (Signed)
   Subjective:    Patient ID: [name] DixonSamantha D [place], female    DOB: 23-Mar-1986, 30 y.o.   MRN: [id number]  HPI 30 year old female presents the office today with concerns of bilateral pain. She states that she's had pain to her heels for several years and she is pretty had injections over the last 10 years to her heels. She also gets majority pain overlying her bunion with the right side worse than the left. She also has a flatfoot. She has been wearing over-the-counter inserts. No recent injury or trauma. No swelling or redness. No tenderness. No other complaints at this time.   Review of Systems  All other systems reviewed and are negative.      Objective:   Physical Exam General: AAO x3, NAD  Dermatological: Skin is warm, dry and supple bilateral. Nails x 10 are well manicured; remaining integument appears unremarkable at this time. There are no open sores, no preulcerative lesions, no rash or signs of infection present.  Vascular: Dorsalis Pedis artery and Posterior Tibial artery pedal pulses are 2/4 bilateral with immedate capillary fill time. Pedal hair growth present. No varicosities and no lower extremity edema present bilateral. There is no pain with calf compression, swelling, warmth, erythema.   Neruologic: Grossly intact via light touch bilateral. Vibratory intact via tuning fork bilateral. Protective threshold with Semmes Wienstein monofilament intact to all pedal sites bilateral. Patellar and Achilles deep tendon reflexes 2+ bilateral. No Babinski or clonus noted bilateral.   Musculoskeletal: there is a decrease in medial arch height upon weightbearing and mild equinus is present. 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 There is no significant hypermobility present and there is no pain or crepitation first MTPJ range of motion. There is slight irritation of the  Medial aspect of the first metatarsal head. At this time there is no significant tenderness palpation of the plantar  medial tubercle of the calcaneus at insertion of plantar fascia. No pain along the course of plantar fascial in the arch of the foot.There is no area pinpoint tenderness or pain the vibratory sensation. Ankle, subtalar, midtarsal range of motion intact. MMT 5/5.  Gait: Unassisted, Nonantalgic.      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 Significant HAV is present right  Worse than left. -Discussed both conservative and surgical treatment options. -At this point she has tried multiple conservative treatments and she is requesting surgical intervention. Discussed with her Lapidus bunionectomy on the right side. Given her flatfoot as well as a deformity of the should benefit more long-term from the Lapidus. I discussed with her risks, occasions as well as alternatives. She'll discuss this with her husband. Also discussed postoperative course. She'll call the office. -At today's opponent Velna HatchetSheila to proceed with steroid injections in the bilateral bunion sites given the pain. A  Mixture of Kenalog 10 and local anesthetic was infiltrated into bilateral bunion sites any complications. Post injection care discussed.  Ovid CurdMatthew Wagoner, DPM

## 2015-08-31 ENCOUNTER — Telehealth: Payer: Self-pay | Admitting: *Deleted

## 2015-08-31 NOTE — Telephone Encounter (Signed)
I'm returning your call.  How can I help you?  "I'd like to schedule surgery for April 19."  I can put you down tentatively.  You're going to have to schedule an appointment for a consultation with Dr. Ardelle AntonWagoner.  "Can I go ahead and schedule that appointment for Monday?"  He can see you at 10:45am.  "That will be fine."

## 2015-08-31 NOTE — Telephone Encounter (Signed)
"  I'm a patient of Dr. Ardelle AntonWagoner.  I needed to call and schedule a Bunionectomy.  I can do it after April 19 because he said he does surgery on Wednesdays.  Give me a call back."

## 2015-09-03 ENCOUNTER — Encounter: Payer: Self-pay | Admitting: Podiatry

## 2015-09-03 ENCOUNTER — Ambulatory Visit (INDEPENDENT_AMBULATORY_CARE_PROVIDER_SITE_OTHER): Payer: 59 | Admitting: Podiatry

## 2015-09-03 VITALS — BP 106/73 | HR 89 | Resp 16

## 2015-09-03 DIAGNOSIS — M2010 Hallux valgus (acquired), unspecified foot: Secondary | ICD-10-CM | POA: Diagnosis not present

## 2015-09-03 NOTE — Patient Instructions (Signed)

## 2015-09-03 NOTE — Progress Notes (Signed)
Patient ID: [name] DixonSamantha D [name], female   DOB: 09-21-1985, 30 y.o.   MRN: [id number]  Subjective: 30 year old female presents the office today for surgical consultation of right foot bunion. After the injections also conscious of the left side is doing much better but does continue to have pain in the right side. At this time should proceed with surgical intervention to the right side for the bunion. Denies any systemic complaints such as fevers, chills, nausea, vomiting. No acute changes since last appointment, and no other complaints at this time.   Objective: AAO x3, NAD DP/PT pulses palpable bilaterally, CRT less than 3 seconds Protective sensation intact with Simms Weinstein monofilament There is moderate to severe HAV deformity present on the right side worse the left side. There is pain along the first metatarsal head medially slight irritation from shoe gear. There is no pain or crepitation first MTPJ range of motion. Hypermobility is present. There is a decrease in medial arch height upon weightbearing. No other areas of tenderness bilateral lower chemise. No areas of pinpoint bony tenderness or pain with vibratory sensation. MMT 5/5, ROM WNL. No edema, erythema, increase in warmth to bilateral lower extremities.  No open lesions or pre-ulcerative lesions.  No pain with calf compression, swelling, warmth, erythema  Assessment: [age] year old female right foot symptomatic HAV, hypermobile  Plan: -All treatment options discussed with the patient including all alternatives, risks, complications.  -Previous x-rays were again reviewed with the patient. Also discussed x-ray findings with the patient's husband today. -I discussed all alternatives risks and complications of surgery and they wish to proceed. Discussed Lapidus bunionectomy right foot. -The incision placement as well as the postoperative course was discussed with the patient. I discussed risks of the surgery which include, but not  limited to, infection, bleeding, pain, swelling, need for further surgery, delayed or nonhealing, painful or ugly scar, numbness or sensation changes, over/under correction, recurrence, transfer lesions, further deformity, hardware failure, DVT/PE, loss of toe/foot. Patient understands these risks and wishes to proceed with surgery. The surgical consent was reviewed with the patient all 3 pages were signed. No promises or guarantees were given to the outcome of the procedure. All questions were answered to the best of my ability. Before the surgery the patient was encouraged to call the office if there is any further questions. [facility] basis. -Discussed risk of DVT/PE with the patient postoperatively. They report no history of blood clots or bleeding disorders with them or in the family. Discussed signs and symptoms of both DVT/PE and directed to go immediatly to the emergency room should any occur during their post-operative course.  -Patient encouraged to call the office with any questions, concerns, change in symptoms.   Ovid CurdMatthew Wagoner, DPM

## 2015-09-13 ENCOUNTER — Other Ambulatory Visit: Payer: Self-pay | Admitting: Internal Medicine

## 2015-09-13 MED FILL — DULoxetine HCL 30 MG CPEP: 30 | 90 days supply | Qty: 90 | Fill #0

## 2015-09-13 MED FILL — clonazePAM 0.5 MG TABS: 0.5 | 10 days supply | Qty: 30 | Fill #0

## 2015-09-13 MED FILL — DULoxetine HCL 60 MG CPEP: 60 | 90 days supply | Qty: 90 | Fill #0

## 2015-09-13 MED FILL — ZOLPIDEM TARTRATE 10 MG TAB: 10 | 30 days supply | Qty: 30 | Fill #1

## 2015-09-28 MED FILL — traZODone HCL 50 MG TABS: 50 | 30 days supply | Qty: 30 | Fill #0

## 2015-10-02 ENCOUNTER — Telehealth: Payer: Self-pay | Admitting: *Deleted

## 2015-10-02 NOTE — Telephone Encounter (Signed)
"  I'm returning your call."  I was calling to let you know we need to reschedule your surgery.  Dr. Ardelle AntonWagoner is sick.  Is there anyway we can reschedule your surgery date to Monday, April 24?  "Is there no one else  That can do it?  I've already made arrangements for work and child care."  Unfortunately no, I am so sorry.  "It's okay, I guess I'll have it done Monday."  Thank you so much.  I called and left a message informing Aram BeechamCynthia at the surgical center.

## 2015-10-02 NOTE — Telephone Encounter (Signed)
I left patient a message to call me back.  I need to reschedule her surgery per Dr. Wagoner due to his illness. 

## 2015-10-05 ENCOUNTER — Encounter: Payer: Self-pay | Admitting: Podiatry

## 2015-10-06 ENCOUNTER — Other Ambulatory Visit: Payer: Self-pay | Admitting: Podiatry

## 2015-10-06 MED ORDER — CEPHALEXIN 500 MG PO CAPS: 500.0000 mg | ORAL_CAPSULE | Freq: Three times a day (TID) | ORAL | Status: DC

## 2015-10-06 MED ORDER — PROMETHAZINE HCL 25 MG PO TABS: 25.0000 mg | ORAL_TABLET | Freq: Three times a day (TID) | ORAL | Status: DC | PRN

## 2015-10-06 MED ORDER — OXYCODONE-ACETAMINOPHEN 5-325 MG PO TABS: 1.0000 | ORAL_TABLET | Freq: Four times a day (QID) | ORAL | Status: DC | PRN

## 2015-10-08 DIAGNOSIS — M2011 Hallux valgus (acquired), right foot: Secondary | ICD-10-CM | POA: Diagnosis not present

## 2015-10-08 DIAGNOSIS — M21611 Bunion of right foot: Secondary | ICD-10-CM | POA: Diagnosis not present

## 2015-10-08 DIAGNOSIS — M79671 Pain in right foot: Secondary | ICD-10-CM | POA: Diagnosis not present

## 2015-10-08 DIAGNOSIS — K259 Gastric ulcer, unspecified as acute or chronic, without hemorrhage or perforation: Secondary | ICD-10-CM | POA: Diagnosis not present

## 2015-10-08 DIAGNOSIS — M25571 Pain in right ankle and joints of right foot: Secondary | ICD-10-CM | POA: Diagnosis not present

## 2015-10-08 MED FILL — OXYCODONE/APAP 5-325: 5-325 | 5 days supply | Qty: 40 | Fill #0

## 2015-10-08 MED FILL — PROMETHAZINE 25 MG TABLET: 25 | 3 days supply | Qty: 10 | Fill #0

## 2015-10-08 MED FILL — CEPHALEXIN 500 MG CAPSULE: 500 | 10 days supply | Qty: 30 | Fill #0

## 2015-10-09 ENCOUNTER — Telehealth: Payer: Self-pay | Admitting: *Deleted

## 2015-10-09 NOTE — Telephone Encounter (Addendum)
Post op courtesy call-Pt states she's doing pretty good the feeling is coming back into her leg.  I instructed pt to begin her pain medication and take as directed, not to be with the foot below her heart more than 5 minutes/hour the 1st week post op, keep the dressing clean and dry until changed at 1st POV.  I encouraged pt to call ahead of running out of the pain medication, because she would need to send someone to the office to pick up the written rx.  10/10/2015-Pt states she is having throbbing in her surgery leg, and a orthopedic pharmacist friend states pt should have Dr. Ardelle AntonWagoner order Tramadol to take in between the dosing of the Percocet 5/325mg  she is taking 2 tablets every 6 hours and it is not lasting.  Pt also asked for refill of the Percocet.  I also told pt to loosen the ace wrap around the splint or to remove and rewrap looser pt states understanding.  Dr. Gabriel RungWagoner's orders for Tramadol called to pt and [facility]. 10/29/2015-Pt request refill of Oxycodone.  Dr. Ardelle AntonWagoner states refill as previously.  I informed pt she could pick up the refill rx in the [place] office.  11/05/2015-pt request refill of Oxycodone.  Dr. Ardelle AntonWagoner states refill as previously.  Refilled and instructed pt to pick [place] the rx in [place].

## 2015-10-09 NOTE — Progress Notes (Signed)
DOS 10/08/2015 Right foot lapidus bunionectomy with plate/screw fixation.

## 2015-10-10 MED ORDER — TRAMADOL HCL 50 MG PO TABS: 50.0000 mg | ORAL_TABLET | Freq: Three times a day (TID) | ORAL | Status: DC | PRN

## 2015-10-10 MED ORDER — OXYCODONE-ACETAMINOPHEN 5-325 MG PO TABS: 1.0000 | ORAL_TABLET | Freq: Four times a day (QID) | ORAL | Status: DC | PRN

## 2015-10-10 MED FILL — traMADol HCL 50 MG TABS: 50 | 10 days supply | Qty: 30 | Fill #0

## 2015-10-10 NOTE — Telephone Encounter (Signed)
OK to rx tramdol to use between dose of percocet. Do not take together.

## 2015-10-12 ENCOUNTER — Other Ambulatory Visit: Payer: Self-pay

## 2015-10-12 MED FILL — OXYCODONE/APAP 5-325: 5-325 | 5 days supply | Qty: 40 | Fill #0

## 2015-10-15 ENCOUNTER — Ambulatory Visit (INDEPENDENT_AMBULATORY_CARE_PROVIDER_SITE_OTHER): Payer: 59 | Admitting: Podiatry

## 2015-10-15 ENCOUNTER — Encounter: Payer: Self-pay | Admitting: Podiatry

## 2015-10-15 ENCOUNTER — Ambulatory Visit (INDEPENDENT_AMBULATORY_CARE_PROVIDER_SITE_OTHER): Payer: 59

## 2015-10-15 DIAGNOSIS — M2010 Hallux valgus (acquired), unspecified foot: Secondary | ICD-10-CM

## 2015-10-15 DIAGNOSIS — Z9889 Other specified postprocedural states: Secondary | ICD-10-CM

## 2015-10-15 MED ORDER — OXYCODONE-ACETAMINOPHEN 5-325 MG PO TABS: 1.0000 | ORAL_TABLET | Freq: Three times a day (TID) | ORAL | Status: DC | PRN

## 2015-10-15 MED FILL — OXYCODONE/APAP 5-325: 5-325 | 7 days supply | Qty: 20 | Fill #0

## 2015-10-16 NOTE — Progress Notes (Signed)
Patient ID: [name] DixonSamantha D [name], female   DOB: 06/18/1985, 30 y.o.   MRN: [id number]  Subjective: [name] DixonSamantha D [name] is a 30 y.o. is seen today in office s/p right Lapidus bunionectomy preformed on 10/08/15. She says the pain is improved but she is still taking pain medication. She is discharged off her foot as much as possible. She does that she hits her toes at times. Denies any systemic complaints such as fevers, chills, nausea, vomiting. No calf pain, chest pain, shortness of breath.   Objective: General: No acute distress, AAOx3  DP/PT pulses palpable 2/4, CRT < 3 sec to all digits.  Protective sensation intact. Motor function intact.  Right foot: Incision is well coapted without any evidence of dehiscence and sutures intact. There is no surrounding erythema, ascending cellulitis, fluctuance, crepitus, malodor, drainage/purulence. There is mild edema around the surgical site. There is mild pain along the surgical site. Hallux is in rectus position.  No other areas of tenderness to bilateral lower extremities.  No other open lesions or pre-ulcerative lesions.  No pain with calf compression, swelling, warmth, erythema.   Assessment and Plan:  Status post right foot Lapidus bunionectomy, doing well with no complications   -Treatment options discussed including all alternatives, risks, and complications -X-rays were obtained and reviewed with the patient. Hardware intact. No evidence of acute fracture or stress fracture. -Antibiotic ointment was applied followed by dressing. Keep dressing clean, dry, intact. -Continue nonweightbearing. CAM boot. -Ice/elevation -Pain medication as needed. -Monitor for any clinical signs or symptoms of infection and DVT/PE and directed to call the office immediately should any occur or go to the ER. -Follow-up in 1 week for possible suture removal or sooner if any problems arise. In the meantime, encouraged to call the office with any questions, concerns, change  in symptoms.   Ovid CurdMatthew Wagoner, DPM

## 2015-10-21 ENCOUNTER — Encounter (HOSPITAL_BASED_OUTPATIENT_CLINIC_OR_DEPARTMENT_OTHER): Payer: Self-pay | Admitting: *Deleted

## 2015-10-21 ENCOUNTER — Emergency Department (HOSPITAL_BASED_OUTPATIENT_CLINIC_OR_DEPARTMENT_OTHER): Payer: 59

## 2015-10-21 ENCOUNTER — Emergency Department (HOSPITAL_BASED_OUTPATIENT_CLINIC_OR_DEPARTMENT_OTHER)
Admission: EM | Admit: 2015-10-21 | Discharge: 2015-10-21 | Disposition: A | Discharge status: Home / Self Care | Payer: 59 | Attending: Emergency Medicine | Admitting: Emergency Medicine

## 2015-10-21 DIAGNOSIS — M79671 Pain in right foot: Secondary | ICD-10-CM | POA: Diagnosis not present

## 2015-10-21 DIAGNOSIS — G8928 Other chronic postprocedural pain: Secondary | ICD-10-CM | POA: Diagnosis not present

## 2015-10-21 DIAGNOSIS — E669 Obesity, unspecified: Secondary | ICD-10-CM | POA: Diagnosis not present

## 2015-10-21 DIAGNOSIS — E039 Hypothyroidism, unspecified: Secondary | ICD-10-CM | POA: Diagnosis not present

## 2015-10-21 DIAGNOSIS — M25571 Pain in right ankle and joints of right foot: Secondary | ICD-10-CM | POA: Diagnosis not present

## 2015-10-21 DIAGNOSIS — F329 Major depressive disorder, single episode, unspecified: Secondary | ICD-10-CM | POA: Diagnosis not present

## 2015-10-21 DIAGNOSIS — G8918 Other acute postprocedural pain: Secondary | ICD-10-CM | POA: Insufficient documentation

## 2015-10-21 IMAGING — DX DG FOOT COMPLETE 3+V*R*
3 series · 3 of 3 positions shown · non-contrast
Comparison: [DATE]

CLINICAL DATA: Acute right foot pain today, greatest in the first
MTP joint. Recent bunionectomy on [DATE].

EXAM:
RIGHT FOOT COMPLETE - 3+ VIEW

[foot ap]
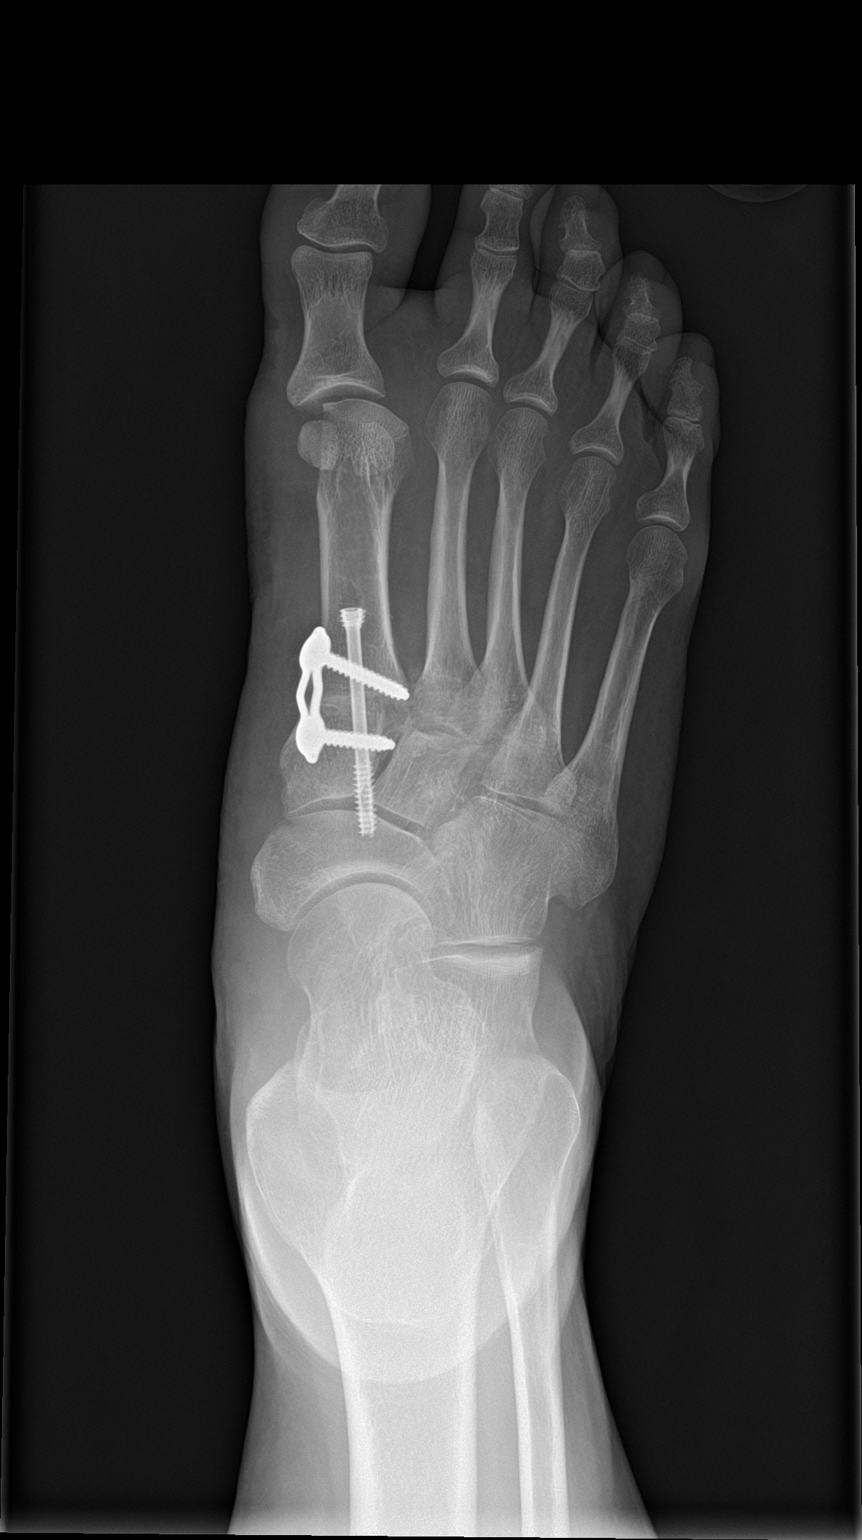

[foot obl]
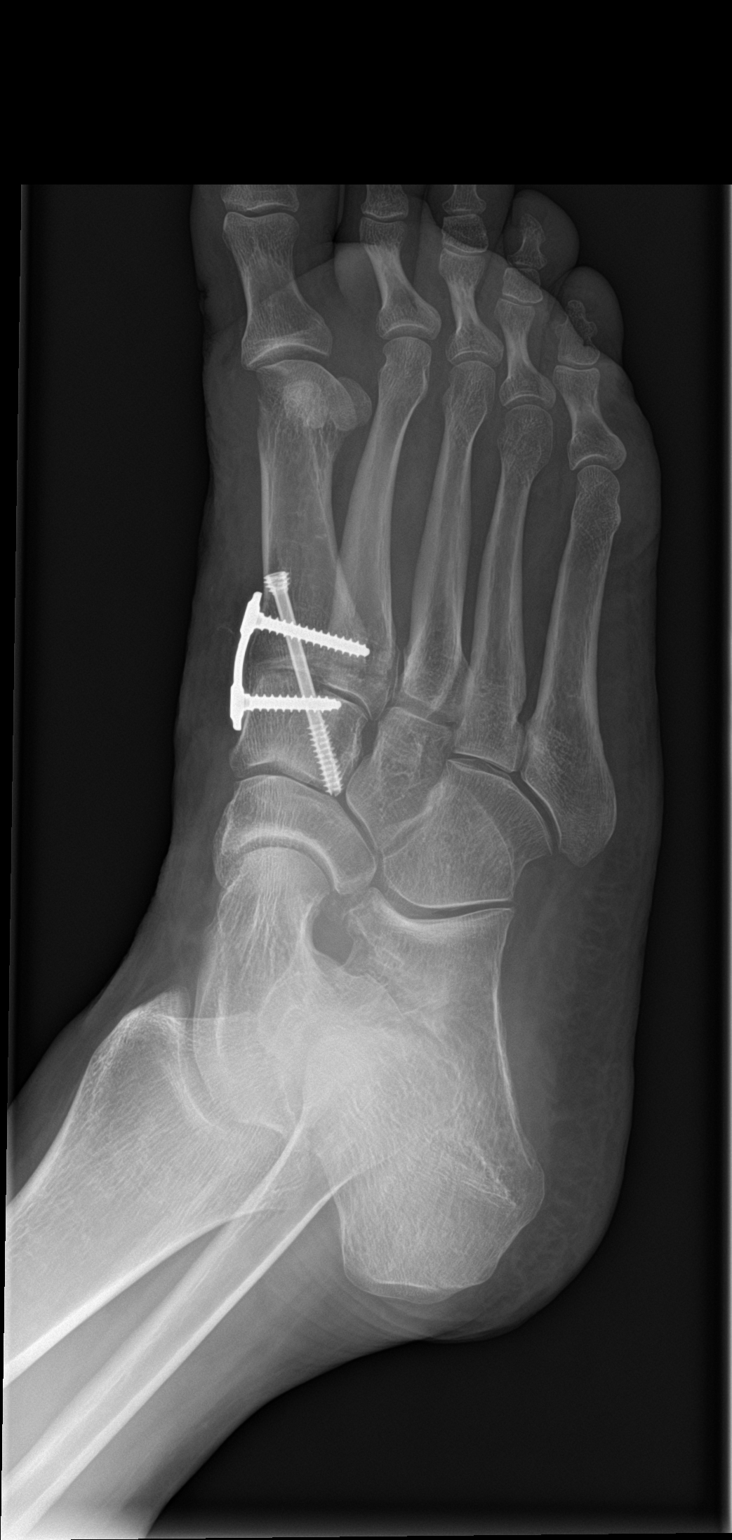

[foot lat]
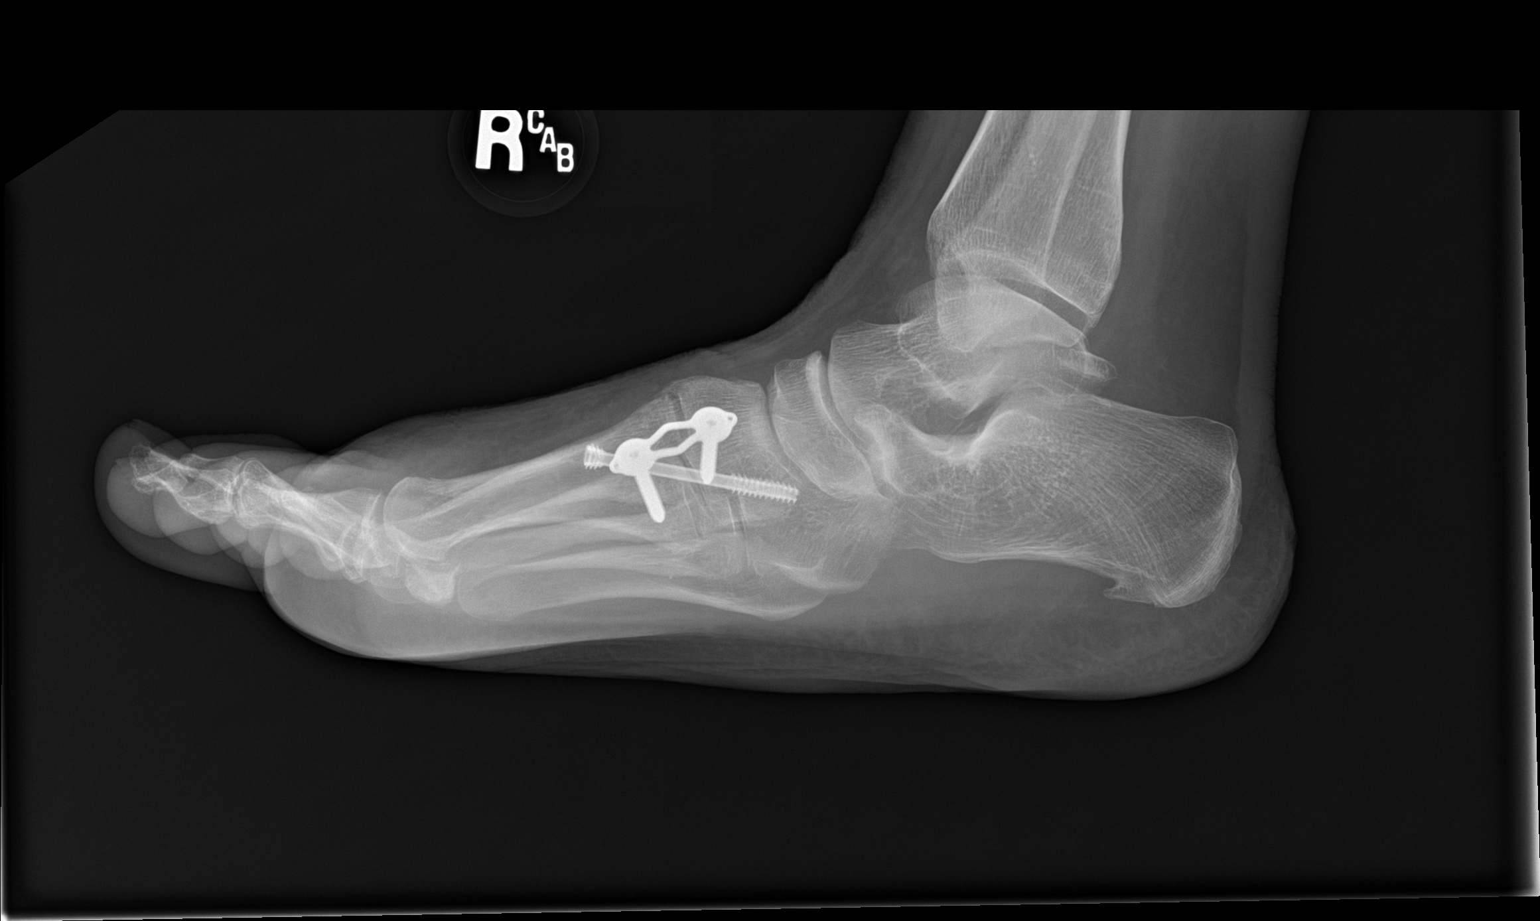

[3 of 3 positions shown; findings below may reference images not displayed]

FINDINGS: Right bunionectomy changes identified with plate and screw fixation
across the medial cuneiform -first metatarsal joint.

There is no evidence of acute fracture or dislocation.

The appearance of the first MTP joint is unchanged from [DATE]
with slight lateral position of the proximal phalanx in relation to
the remaining metatarsal head.

A small calcaneal spur is present.
IMPRESSION: No acute abnormality and no significant change from [DATE].
Bunionectomy changes.

## 2015-10-21 MED ORDER — OXYCODONE-ACETAMINOPHEN 5-325 MG PO TABS: 1.0000 | ORAL_TABLET | Freq: Four times a day (QID) | ORAL | Status: DC | PRN

## 2015-10-21 MED ORDER — ONDANSETRON 4 MG PO TBDP
4.0000 mg | ORAL_TABLET | Freq: Once | ORAL | Status: AC
  Administered 2015-10-21: 4 mg via ORAL
  Filled 2015-10-21: qty 1

## 2015-10-21 NOTE — ED Notes (Signed)
Pt given d/c instructions as per chart. Verbalizes understanding. No questions. Rx x 1 with narc prec instructions.

## 2015-10-21 NOTE — ED Notes (Addendum)
Pt c/o right foot pain , "felt a pop", recent foot surgery, on phone with [facility]  DR. Hyatt  while entering triage

## 2015-10-21 NOTE — Discharge Instructions (Signed)
Follow-up as planned 

## 2015-10-21 NOTE — ED Provider Notes (Signed)
CSN: [id number]     Arrival date & time 10/21/15  1818 History  By signing my name below, I, [name] [name], attest that this documentation has been prepared under the direction and in the presence of Davonna Belling, MD. Electronically Signed: Irene [name], ED Scribe. 10/21/2015. 6:40 PM.  Chief Complaint  Patient presents with  . Foot Pain   The history is provided by the patient. No language interpreter was used.  HPI Comments: [name] [name] is a 30 y.o. female s/p right bunionectomy who presents to the Emergency Department complaining of right foot pain onset earlier this evening. Pt has been ambulating with a boot for her foot. She states that she heard two "pops" from the bottom of her foot with associated shooting pain soon after. She reports mild tingling to the right second toe. Pt has called her orthopedist at [facility] who advised her to come to the ED. She states that she has been able to have pain control at home with ibuprofen. Pt is currently non-weightbearing. Pt denies numbness and weakness.   Past Medical History  Diagnosis Date  . OBESITY   . DEPRESSION   . Anxiety   . Infertility, female hx   . Anemia     after 1st delivery  . GERD (gastroesophageal reflux disease)     pregnancy related- no meds  . PCOS (polycystic ovarian syndrome)   . Morbid obesity ([place])     s/p RY 08/2012 - start weight 290#  . HYPOTHYROIDISM     hx of, normalized TSH during pregnanacy 2011  . Headache(784.0)     hx of migraines  . Pregnancy induced hypertension     gestational   Past Surgical History  Procedure Laterality Date  . Cesarean section      x2  . Tonsillectomy    . Wisdom tooth extraction    . Tubal ligation    . Lithotripsy Left   . 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 Upper gi endoscopy   08/24/2012    Procedure: UPPER GI ENDOSCOPY;  Surgeon: Gayland Curry, MD;  Location: [facility];  Service: General;;   Family History  Problem Relation Age of Onset  . Hypertension Mother   . Hyperlipidemia Father   . Hypertension Father   . Kidney disease Father   . Colon cancer Father 22    colon, met to liver  . Hypertension Maternal Grandmother   . Diabetes Maternal Grandfather   . Uterine cancer Maternal Grandmother 76   Social History  Substance Use Topics  . Smoking status: Never Smoker   . Smokeless tobacco: Never Used  . Alcohol Use: No     Comment: rare/socially   OB History    Gravida Para Term Preterm AB TAB SAB Ectopic Multiple Living   2 2 2  0 0 0 0 0 0 2     Review of Systems  Musculoskeletal: Positive for arthralgias.  Neurological: Negative for weakness and numbness.  All other systems reviewed and are negative.     Allergies  Review of patient's allergies indicates not on file.  Home Medications   Prior to Admission medications   Medication Sig Start Date End Date Taking? Authorizing Provider  Acetaminophen (TYLENOL PO) Take 500 mg by mouth every 6 (six) hours as needed (pain).     Historical  Provider, MD  Calcium Citrate-Vitamin D (CALCIUM CITRATE + D PO) Take 1,200 mg by mouth 2 (two) times daily.     Historical Provider, MD  cephALEXin (KEFLEX) 500 MG capsule Take 1 capsule (500 mg total) by mouth 3 (three) times daily. 10/06/15   Trula Slade, DPM  clonazePAM (KLONOPIN) 0.5 MG tablet Take 0.5-1 tablet daily by mouth as needed for anxiety and 1-2 tablets nightly by mouth as needed for sleep 06/28/15   Golden Circle, FNP  DULoxetine (CYMBALTA) 30 MG capsule Take 1 capsule (30 mg total) by mouth daily. 06/28/15   Golden Circle, FNP  DULoxetine (CYMBALTA) 60 MG capsule Take 1 capsule (60 mg total) by mouth daily. 06/28/15   Golden Circle, FNP  ferrous sulfate 325 (65 FE) MG tablet Take 325 mg by mouth daily with breakfast.    Historical Provider,  MD  Multiple Vitamin (MULTI-VITAMIN DAILY PO) Take 1 tablet by mouth 2 (two) times daily.     Historical Provider, MD  ondansetron (ZOFRAN ODT) 4 MG disintegrating tablet Take 1 tablet (4 mg total) by mouth every 8 (eight) hours as needed for nausea. 05/07/13   Larene Pickett, PA-C  oxyCODONE-acetaminophen (ROXICET) 5-325 MG tablet Take 1-2 tablets by mouth every 6 (six) hours as needed for severe pain. 10/21/15   Davonna Belling, MD  pantoprazole (PROTONIX) 40 MG tablet Take 40 mg by mouth daily.    Historical Provider, MD  polyethylene glycol (MIRALAX / GLYCOLAX) packet Take 17 g by mouth daily as needed.    Historical Provider, MD  promethazine (PHENERGAN) 25 MG tablet Take 1 tablet (25 mg total) by mouth every 8 (eight) hours as needed for nausea or vomiting. 10/06/15   Trula Slade, DPM  traMADol (ULTRAM) 50 MG tablet Take 1 tablet (50 mg total) by mouth every 8 (eight) hours as needed. 10/10/15   Trula Slade, DPM  traZODone (DESYREL) 50 MG tablet TAKE 1/2-1 TABLET BY MOUTH AT BEDTIME AS NEEDED FOR SLEEP. 09/13/15   Golden Circle, FNP  vitamin B-12 (CYANOCOBALAMIN) 1000 MCG tablet Take 5,000 mcg by mouth daily.    Historical Provider, MD  vitamin C (ASCORBIC ACID) 500 MG tablet Take 500 mg by mouth daily.    Historical Provider, MD  zolpidem (AMBIEN) 10 MG tablet Take 1 tablet (10 mg total) by mouth at bedtime as needed for sleep. 08/13/15   Golden Circle, FNP   BP 128/91 mmHg  Pulse 70  Temp(Src) 98 F (36.7 C) (Oral)  Resp 20  SpO2 100%  LMP  Physical Exam  Constitutional: She appears well-developed and well-nourished. No distress.  HENT:  Head: Normocephalic and atraumatic.  Mouth/Throat: Oropharynx is clear and moist. No oropharyngeal exudate.  Eyes: Pupils are equal, round, and reactive to light.  Neck: Normal range of motion.  Musculoskeletal: Normal range of motion.  5 cm incision vertically over the dorsum of right foot at 1st metacarpal; NVI over the right foot  with slightly decreased sensation in the second toe of the right foot; no drainage from incision site; sutures appear to be intact  Neurological: She is alert.  Skin: Skin is warm.  Psychiatric: She has a normal mood and affect.    ED Course  Procedures (including critical care time) DIAGNOSTIC STUDIES: Oxygen Saturation is 100% on RA, normal by my interpretation.    COORDINATION OF CARE: 6:40 PM-Discussed treatment plan which includes x-ray with pt at bedside and pt agreed to plan.  Labs Review Labs Reviewed - No data to display  Imaging Review Dg Foot Complete Right  10/21/2015  CLINICAL DATA:  Acute right foot pain today, greatest in the first MTP joint. Recent bunionectomy on 10/09/2015. EXAM: RIGHT FOOT COMPLETE - 3+ VIEW COMPARISON:  10/15/2015 FINDINGS: Right bunionectomy changes identified with plate and screw fixation across the medial cuneiform -first metatarsal joint. There is no evidence of acute fracture or dislocation. The appearance of the first MTP joint is unchanged from 10/15/2015 with slight lateral position of the proximal phalanx in relation to the remaining metatarsal head. A small calcaneal spur is present. IMPRESSION: No acute abnormality and no significant change from 10/15/2015. Bunionectomy changes. Electronically Signed   By: Margarette Canada M.D.   On: 10/21/2015 19:11   I have personally reviewed and evaluated these images and lab results as part of my medical decision-making.   EKG Interpretation None      MDM   Final diagnoses:  Post-operative pain    Patient with foot pain. Recent surgery. Felt a pop. X-ray stable. Wound appears well-healing. Will discharge home with a few more pain pills. Follow-up with her podiatrist.   I personally performed the services described in this documentation, which was scribed in my presence. The recorded information has been reviewed and is accurate.      Davonna Belling, MD 10/22/15 0005

## 2015-10-21 NOTE — ED Notes (Addendum)
Pt reports she is out of pain medications.

## 2015-10-22 ENCOUNTER — Ambulatory Visit (INDEPENDENT_AMBULATORY_CARE_PROVIDER_SITE_OTHER): Payer: 59 | Admitting: Podiatry

## 2015-10-22 ENCOUNTER — Encounter: Payer: Self-pay | Admitting: Podiatry

## 2015-10-22 VITALS — BP 118/78 | HR 99 | Resp 18

## 2015-10-22 DIAGNOSIS — T1490 Injury, unspecified: Secondary | ICD-10-CM

## 2015-10-22 DIAGNOSIS — T148XXA Other injury of unspecified body region, initial encounter: Secondary | ICD-10-CM

## 2015-10-22 DIAGNOSIS — Z9889 Other specified postprocedural states: Secondary | ICD-10-CM

## 2015-10-22 DIAGNOSIS — M2010 Hallux valgus (acquired), unspecified foot: Secondary | ICD-10-CM

## 2015-10-22 MED ORDER — PROMETHAZINE HCL 25 MG PO TABS: 25.0000 mg | ORAL_TABLET | Freq: Three times a day (TID) | ORAL | Status: DC | PRN

## 2015-10-22 MED ORDER — OXYCODONE-ACETAMINOPHEN 5-325 MG PO TABS: 1.0000 | ORAL_TABLET | Freq: Four times a day (QID) | ORAL | Status: DC | PRN

## 2015-10-22 MED FILL — OXYCODONE/APAP 5/325MG: 5-325 | 2 days supply | Qty: 10 | Fill #0

## 2015-10-22 MED FILL — PROMETHAZINE 25 MG TABLET: 25 | 6 days supply | Qty: 20 | Fill #0

## 2015-10-23 MED FILL — OXYCODONE/APAP 5/325MG: 5-325 | 5 days supply | Qty: 40 | Fill #0

## 2015-10-25 NOTE — Progress Notes (Signed)
Patient ID: [name] DixonSamantha D [name], female   DOB: 1985-10-12, 30 y.o.   MRN: [id number]  Subjective: [name] DixonSamantha D [name] is a 30 y.o. is seen today in office s/p right Lapidus bunionectomy preformed on 10/08/15. She presents today as she had the emergency room over the weekend. She is walking the CAM boot and fell and she felt a pop in her big toe joint. She states that she had increase in pain she was given pain medicine however she is asking for refill she will run on the next couple of days. Since the injury she has remained nonweightbearing. Denies any systemic complaints such as fevers, chills, nausea, vomiting. No calf pain, chest pain, shortness of breath.   Objective: General: No acute distress, AAOx3  DP/PT pulses palpable 2/4, CRT < 3 sec to all digits.  Protective sensation intact. Motor function intact.  Right foot: Incision is well coapted without any evidence of dehiscence and sutures intact. There is no surrounding erythema, ascending cellulitis, fluctuance, crepitus, malodor, drainage/purulence. There is mild edema around the surgical site, mostly over the 1st MTPJ. There is no sensory erythema or increase in warmth. There is mild pain along the surgical site. Hallux is in rectus position. There is adequate range of motion of the first MTPJ. No other areas of tenderness to bilateral lower extremities.  No other open lesions or pre-ulcerative lesions.  No pain with calf compression, swelling, warmth, erythema.   Assessment and Plan:  Status post right foot Lapidus bunionectomy, status post fall injuring the first MPJ.  -Treatment options discussed including all alternatives, risks, and complications -X-rays were obtained and reviewed with the patient. There doesn't be slightly more lateral deviation of the hallux. Concerns for possible hallux varus however the toe does sit clinically in good position there is good range of motion. I discussed the sponges with the patient. I discussed the  return to surgery versus watch and see. For now we will hold off on returning the surgery of the toe is functioning well and appears to be clinically a good position. I discussed third some point she may need to have a revision surgery and she understands this. I did dispense a brace to help hold the toe in corrected position. Recommended strict nonweightbearing of the right foot. Continue ice and elevation. Refill pain medicine today. -Follow-up as scheduled for suture removal or sooner if any issues are to arise.  Ovid CurdMatthew Wagoner, DPM

## 2015-10-26 ENCOUNTER — Ambulatory Visit (INDEPENDENT_AMBULATORY_CARE_PROVIDER_SITE_OTHER): Payer: 59 | Admitting: Podiatry

## 2015-10-26 DIAGNOSIS — Z9889 Other specified postprocedural states: Secondary | ICD-10-CM | POA: Diagnosis not present

## 2015-10-26 DIAGNOSIS — M2010 Hallux valgus (acquired), unspecified foot: Secondary | ICD-10-CM | POA: Diagnosis not present

## 2015-10-26 NOTE — Progress Notes (Signed)
Patient ID: [name] [name], female   DOB: Apr 04, 1986, 30 y.o.   MRN: [id number]  Subjective: [name] [name] is a 30 y.o. is seen today in office s/p right Lapidus bunionectomy preformed on 10/08/15. She states that overall she is feeling better than she did on Monday. She gets a throbbing sensation however the pain has decreased. She is also decreasing her pain medicine she has been taking. She is remained in the cam boot with the use of a knee scooter remain nonweightbearing.  Denies any systemic complaints such as fevers, chills, nausea, vomiting. No calf pain, chest pain, shortness of breath.   Objective: General: No acute distress, AAOx3  DP/PT pulses palpable 2/4, CRT < 3 sec to all digits.  Protective sensation intact. Motor function intact.  Right foot: Incision is well coapted without any evidence of dehiscence and sutures intact. There is no surrounding erythema, ascending cellulitis, fluctuance, crepitus, malodor, drainage/purulence. There is minimal edema around the surgical site. There is no surrounding erythema or increase in warmth. There is mild pain along the surgical site but this appears to be decreased. Hallux is in rectus position. There is adequate range of motion of the first MTPJ. No other areas of tenderness to bilateral lower extremities.  No other open lesions or pre-ulcerative lesions.  No pain with calf compression, swelling, warmth, erythema.   Assessment and Plan:  Status post right foot Lapidus bunionectomy, status post fall injuring the first MPJ.  -Treatment options discussed including all alternatives, risks, and complications -The suture ends were removed. Antibiotic ointment was applied followed by a dressing. She can start to shower tomorrow as long as the incision remains closed. Interrupted ointment daily. Continue with the brace to keep the toe in a rectus position. Clinically the toe sits in very good position and there is good range of motion. We  will continue to monitor. -Ice and elevation. -Nonweightbearing CAM boot. -ASA daily -Monitor signs or symptoms of infection and/or DVT/PE and directed to the ER should any occur. -Follow-up in 2 weeks or sooner if any problems arise. In the meantime, encouraged to call the office with any questions, concerns, change in symptoms.   Ovid CurdMatthew Eugina Row, DPM

## 2015-10-29 MED ORDER — OXYCODONE-ACETAMINOPHEN 5-325 MG PO TABS: 1.0000 | ORAL_TABLET | Freq: Three times a day (TID) | ORAL | Status: DC | PRN

## 2015-10-29 MED FILL — OXYCODONE/APAP 5/325MG: 5-325 | 13 days supply | Qty: 40 | Fill #0

## 2015-10-29 MED FILL — traZODone HCL 50 MG TABS: 50 | 30 days supply | Qty: 30 | Fill #1

## 2015-11-05 MED ORDER — OXYCODONE-ACETAMINOPHEN 5-325 MG PO TABS: 1.0000 | ORAL_TABLET | Freq: Three times a day (TID) | ORAL | Status: DC | PRN

## 2015-11-05 MED FILL — OXYCODONE/APAP 5-325: 5-325 | 13 days supply | Qty: 40 | Fill #0

## 2015-11-09 ENCOUNTER — Encounter: Payer: Self-pay | Admitting: Podiatry

## 2015-11-09 ENCOUNTER — Ambulatory Visit (INDEPENDENT_AMBULATORY_CARE_PROVIDER_SITE_OTHER): Payer: 59

## 2015-11-09 ENCOUNTER — Ambulatory Visit (INDEPENDENT_AMBULATORY_CARE_PROVIDER_SITE_OTHER): Payer: 59 | Admitting: Podiatry

## 2015-11-09 VITALS — BP 127/80 | HR 92 | Resp 12

## 2015-11-09 DIAGNOSIS — M2010 Hallux valgus (acquired), unspecified foot: Secondary | ICD-10-CM

## 2015-11-09 DIAGNOSIS — Z9889 Other specified postprocedural states: Secondary | ICD-10-CM

## 2015-11-13 NOTE — Progress Notes (Signed)
Patient ID: [name] [name], female   DOB: 12-30-1985, 30 y.o.   MRN: [id number]  Subjective: [name] [name] is a 30 y.o. is seen today in office s/p right Lapidus bunionectomy preformed on 10/08/15. She states that she still gets some discomfort to the foot which she has decreased pain medicine she's been taking. She is remaining nonweightbearing and has had no recent injury since last appointment. Denies any systemic complaints such as fevers, chills, nausea, vomiting. No calf pain, chest pain, shortness of breath.   Objective: General: No acute distress, AAOx3  DP/PT pulses palpable 2/4, CRT < 3 sec to all digits.  Protective sensation intact. Motor function intact.  Right foot: Incision is well coapted without any evidence of dehiscence and scar has formed There is no surrounding erythema, ascending cellulitis, fluctuance, crepitus, malodor, drainage/purulence. There is minimal edema around the surgical site but is decreased.. There is no surrounding erythema or increase in warmth. There is mild pain along the surgical site but this appears to be decreased. Hallux is in rectus position. There is adequate range of motion of the first MTPJ. No other areas of tenderness to bilateral lower extremities.  No other open lesions or pre-ulcerative lesions.  No pain with calf compression, swelling, warmth, erythema.   Assessment and Plan:  Status post right foot Lapidus bunionectomy, status post fall injuring the first MPJ.  -Treatment options discussed including all alternatives, risks, and complications -X-rays were obtained and reviewed with the patient. Hardware across the first metatarsal cuneiform joint. The hallux does sit any varus position however clinically the toe is rectus and appears to be in good alignment. There is range of motion of the first MTPJ. -Continue with bracing. -First MPJ range of motion exercises. -Ice and elevation. -Nonweightbearing CAM boot. -ASA daily -Monitor  signs or symptoms of infection and/or DVT/PE and directed to the ER should any occur. -Follow-up in 2 weeks or sooner if any problems arise. In the meantime, encouraged to call the office with any questions, concerns, change in symptoms.   Ovid CurdMatthew Varick Keys, DPM

## 2015-11-15 ENCOUNTER — Telehealth: Payer: Self-pay | Admitting: *Deleted

## 2015-11-15 NOTE — Telephone Encounter (Addendum)
Pt states she is going to run out of the Percocet and would like a refill before Saturday. DOS 10/09/2015.  11/16/2015-I informed pt of Dr. Gabriel RungWagoner's orders and she will pick up rx in the [place] office. 11/28/2015-Pt states Dr. Ardelle AntonWagoner released her to go back to work 12/12/2015, her employer needs a note of release as well as a list of restrictions/accommadations and length of time needed. 11/29/2015-Letter typed and informed pt she could pick up in the [place]. 12/05/2015-Pt request refill of pain medication, she's going out of town this weekend. Dr. Ardelle AntonWagoner ordered refill Hydrocodone as previously.  Informed pt she could pick up rx in the [place]. 12/14/2015-Fax[facility] orders to [facility] on [address]. 01/10/2016-Pt states she doing PT and Tylenol and Ibuprofen are not helping the pain and beginning to bother her stomach, could there be a different pain medication available other than Vicodin.  If not then she would like a refill of Vicodin. Left message informing pt Dr. Ardelle AntonWagoner ordered Tramadol. Orders called to [facility]. 03/25/2016-Pt requested refill of the pain medication. 03/26/2016-Left message informing pt I would refill the Tramadol, but if she was continuing the have pain she needed to make an appt prior to future refills. 04/09/2016-Pt requested Tramadol. Dr. Bary CastillaWagoner okayed as previously, and needs and appt if still has pain. Informed pt of the orders and that her LOV stated Dr. Ardelle AntonWagoner would like to see he 2 weeks after her last visit. Orders called to pt's pharmacy. 05/05/2016-Pt states she is having pain in her right great toe from the surgery and up the leg from wearing the boot, and would like pain medication. Dr. Bary CastillaWagoner okayed refill Hydrocodone as previously. 05/19/2016-Pt had right foot surgery 03/14/2016 and request refill of pain medication. 05/20/2016-DrArdelle Anton. Wagoner refilled Hydrocodone 5/325mg  #30 one every 6 hours prn foot pain. Informed pt of Dr.  Gabriel RungWagoner's refill and she states she will pick up in the [place] office in about 1 hour. 05/26/2016-Pt states her orthotics are not helping and she needs a refill of the Vicodin. Left message informing pt that often the orthotics can be adjusted to bring them to her appt tomorrow, and ask for the Vicodin tomorrow. 06/02/2016-Pt request more pain medication and to know when and where to pick up the orthotics that were sent out. 06/03/2016-Informed pt of Dr. Gabriel RungWagoner's orders and Lisa's statement. 06/23/2016-Pt requested refill of pain medications states [place] is working in [place] and would like to pick up there. DR. Ardelle AntonWagoner states contact pt and see how she is doing, last time she was seen she was doing better. Left message to call and discuss her pain. Pt states she is now in training for a new job and is working full time and the foot hurts between the 1st and 2nd toes on the bottom of her foot. I informed pt there would not be a doctor [facility] until tomorrow and I would inform the doctor of Dr. Gabriel RungWagoner's orders to refill Vicodin 5/325mg  #20 one tablet every 6 hours.

## 2015-11-15 NOTE — Telephone Encounter (Signed)
OK to refill. Please give only 25 tabs

## 2015-11-16 MED ORDER — OXYCODONE-ACETAMINOPHEN 5-325 MG PO TABS: 1.0000 | ORAL_TABLET | Freq: Three times a day (TID) | ORAL | Status: DC | PRN

## 2015-11-16 MED FILL — OXYCODONE/APAP 5/325 MG TAB: 5-325 | 8 days supply | Qty: 25 | Fill #0

## 2015-11-26 ENCOUNTER — Ambulatory Visit (INDEPENDENT_AMBULATORY_CARE_PROVIDER_SITE_OTHER): Payer: 59 | Admitting: Podiatry

## 2015-11-26 ENCOUNTER — Encounter: Payer: Self-pay | Admitting: Podiatry

## 2015-11-26 ENCOUNTER — Ambulatory Visit (INDEPENDENT_AMBULATORY_CARE_PROVIDER_SITE_OTHER): Payer: 59

## 2015-11-26 VITALS — BP 130/86 | HR 110 | Resp 18

## 2015-11-26 DIAGNOSIS — M2010 Hallux valgus (acquired), unspecified foot: Secondary | ICD-10-CM | POA: Diagnosis not present

## 2015-11-26 DIAGNOSIS — Z9889 Other specified postprocedural states: Secondary | ICD-10-CM

## 2015-11-26 MED ORDER — HYDROCODONE-ACETAMINOPHEN 5-325 MG PO TABS: 1.0000 | ORAL_TABLET | Freq: Four times a day (QID) | ORAL | Status: DC | PRN

## 2015-11-26 MED FILL — HYDROCODON-APAP 5-325: 5-325 | 3 days supply | Qty: 30 | Fill #0

## 2015-11-27 NOTE — Progress Notes (Signed)
Patient ID: [name] DixonSamantha D [name], female   DOB: 02-13-1986, 30 y.o.   MRN: [id number]  Subjective: [name] DixonSamantha D [name] is a 30 y.o. is seen today in office s/p right Lapidus bunionectomy preformed on 10/08/15. She has been walking in a cam boot however using crutches at times. She has not been wearing the splint for her toe. She states that she is doing well and not having much pain to the surgical site. She has decreased amount of pain medicine she's been taking and she is asking for something less than Percocet today. Swelling has improved denies any redness. Denies any systemic complaints such as fevers, chills, nausea, vomiting. No calf pain, chest pain, shortness of breath.   Objective: General: No acute distress, AAOx3  DP/PT pulses palpable 2/4, CRT < 3 sec to all digits.  Protective sensation intact. Motor function intact.  Right foot: Incision is well coapted without any evidence of dehiscence and scar has formed. The hallux of the rectus position. There is increased range of motion the first MPJ and there is no pain or restriction with MPJ range of motion. Upon stimulator weightbearing the hallux continues sustained a rectus position there is no evidence of varus. There is decreased edema on surgical site. There is no mild tenderness at this time mostly on the first metatarsal cuneiform joint. No other areas of tenderness to bilateral lower extremities.  No other open lesions or pre-ulcerative lesions.  No pain with calf compression, swelling, warmth, erythema.   Assessment and Plan:  Status post right foot Lapidus bunionectomy  -Treatment options discussed including all alternatives, risks, and complications -X-rays were obtained and reviewed with the patient. Hardware across the first metatarsal cuneiform joint. The hallux does sit any varus position however clinically the toe is rectus and appears to be in good alignment. There is range of motion of the first MTPJ. -She is ready has been  weightbearing as tolerated although I recommended nonweightbearing. Continue to start partial weightbearing as tolerated. Continue first MPJ range of motion exercises. Ice and elevation. Prescribed Vicodin to take as needed. She cannot work or drive while taking pain medicine. She divided to work in 2 weeks with a sitting position. -Follow-up in 2 weeks or sooner if any problems arise. In the meantime, encouraged to call the office with any questions, concerns, change in symptoms.   Ovid CurdMatthew Dala Breault, DPM

## 2015-11-28 NOTE — Telephone Encounter (Signed)
She is to remain in CAM boot. She states there is a sitting position at work which would be idea.

## 2015-11-29 ENCOUNTER — Encounter: Payer: Self-pay | Admitting: *Deleted

## 2015-12-03 ENCOUNTER — Encounter: Payer: Self-pay | Admitting: Family

## 2015-12-03 DIAGNOSIS — F32A Depression, unspecified: Secondary | ICD-10-CM

## 2015-12-03 DIAGNOSIS — F329 Major depressive disorder, single episode, unspecified: Secondary | ICD-10-CM

## 2015-12-03 DIAGNOSIS — F419 Anxiety disorder, unspecified: Secondary | ICD-10-CM

## 2015-12-03 DIAGNOSIS — G479 Sleep disorder, unspecified: Secondary | ICD-10-CM

## 2015-12-04 ENCOUNTER — Other Ambulatory Visit: Payer: Self-pay | Admitting: Family

## 2015-12-04 MED FILL — traZODone HCL 50 MG TABS: 50 | 30 days supply | Qty: 30 | Fill #2 | Status: TO

## 2015-12-05 ENCOUNTER — Other Ambulatory Visit: Payer: Self-pay | Admitting: Family

## 2015-12-05 DIAGNOSIS — M2010 Hallux valgus (acquired), unspecified foot: Secondary | ICD-10-CM

## 2015-12-05 MED ORDER — ZOLPIDEM TARTRATE 10 MG PO TABS: 10.0000 mg | ORAL_TABLET | Freq: Every evening | ORAL | Status: DC | PRN

## 2015-12-05 MED ORDER — CLONAZEPAM 0.5 MG PO TABS: ORAL_TABLET | ORAL | Status: DC

## 2015-12-05 MED ORDER — HYDROCODONE-ACETAMINOPHEN 5-325 MG PO TABS: 1.0000 | ORAL_TABLET | Freq: Four times a day (QID) | ORAL | Status: DC | PRN

## 2015-12-05 MED FILL — HYDROCODON-APAP 5-325: 5-325 | 7 days supply | Qty: 30 | Fill #0

## 2015-12-05 MED FILL — ZOLPIDEM TARTRATE 10 MG TAB: 10 | 30 days supply | Qty: 30 | Fill #0

## 2015-12-05 MED FILL — clonazePAM 0.5 MG TABS: 0.5 | 10 days supply | Qty: 30 | Fill #0

## 2015-12-05 NOTE — Telephone Encounter (Signed)
OK to refill

## 2015-12-14 ENCOUNTER — Encounter: Payer: Self-pay | Admitting: Podiatry

## 2015-12-14 ENCOUNTER — Ambulatory Visit (INDEPENDENT_AMBULATORY_CARE_PROVIDER_SITE_OTHER): Payer: 59 | Admitting: Podiatry

## 2015-12-14 ENCOUNTER — Ambulatory Visit (INDEPENDENT_AMBULATORY_CARE_PROVIDER_SITE_OTHER): Payer: 59

## 2015-12-14 DIAGNOSIS — M2011 Hallux valgus (acquired), right foot: Secondary | ICD-10-CM

## 2015-12-14 DIAGNOSIS — Z9889 Other specified postprocedural states: Secondary | ICD-10-CM | POA: Diagnosis not present

## 2015-12-14 MED ORDER — HYDROCODONE-ACETAMINOPHEN 5-325 MG PO TABS: 1.0000 | ORAL_TABLET | Freq: Four times a day (QID) | ORAL | Status: DC | PRN

## 2015-12-14 MED FILL — HYDROCODON-APAP 5-325: 5-325 | 5 days supply | Qty: 20 | Fill #0

## 2015-12-26 DIAGNOSIS — M79671 Pain in right foot: Secondary | ICD-10-CM | POA: Diagnosis not present

## 2015-12-26 DIAGNOSIS — M25674 Stiffness of right foot, not elsewhere classified: Secondary | ICD-10-CM | POA: Diagnosis not present

## 2015-12-26 DIAGNOSIS — M79674 Pain in right toe(s): Secondary | ICD-10-CM | POA: Diagnosis not present

## 2015-12-26 DIAGNOSIS — M25474 Effusion, right foot: Secondary | ICD-10-CM | POA: Diagnosis not present

## 2015-12-27 DIAGNOSIS — M79671 Pain in right foot: Secondary | ICD-10-CM | POA: Diagnosis not present

## 2015-12-27 DIAGNOSIS — M25674 Stiffness of right foot, not elsewhere classified: Secondary | ICD-10-CM | POA: Diagnosis not present

## 2015-12-27 DIAGNOSIS — M25474 Effusion, right foot: Secondary | ICD-10-CM | POA: Diagnosis not present

## 2015-12-27 DIAGNOSIS — M79674 Pain in right toe(s): Secondary | ICD-10-CM | POA: Diagnosis not present

## 2015-12-27 NOTE — Progress Notes (Signed)
Patient ID: [name] [name] Bohle, female   DOB: 1985/07/05, 30 y.o.   MRN: [id number]  Subjective: [name] [name] Rheaume is a 30 y.o. is seen today in office s/p right Lapidus bunionectomy preformed on 10/08/15. She has been walking in a cam boot. She does comfort times follow-up the cam boot she is still getting some discomfort. She says the swelling as improved. She does not have any pain returned and with the big toe. Denies any systemic complaints such as fevers, chills, nausea, vomiting. No calf pain, chest pain, shortness of breath.   Objective: General: No acute distress, AAOx3  DP/PT pulses palpable 2/4, CRT < 3 sec to all digits.  Protective sensation intact. Motor function intact.  Right foot: Incision is well coapted without any evidence of dehiscence and scar has formed. Hallux continues sustained in a rectus position. There is no restriction or pain with first MPJ range of motion. There is mild discomfort along the surgical site which is improving however slowly. There is minimal edema to the area and there is no areas of erythema or increase in warmth. There is no other areas of tenderness to the foot. No other open lesions or pre-ulcerative lesions.  No pain with calf compression, swelling, warmth, erythema.   Assessment and Plan:  Status post right foot Lapidus bunionectomy  -Treatment options discussed including all alternatives, risks, and complications -X-rays were obtained and reviewed with the patient. Hardware across the first metatarsal cuneiform joint. Hallux still sits in a somewhat varus position of this. Be unchanged and clinically the toes in rectus position. I again discussed these findings with the patient and the potential need to have another surgery. -Offloaded submetatarsal 1 tissues having some discomfort on the sesamoids. She can start to transition to regular shoe as tolerated. -Pain medication as needed -Continue ice and elevation -Follow-up as scheduled or sooner  if any issues are to arise.  Ovid CurdMatthew Wagoner, DPM

## 2015-12-28 ENCOUNTER — Encounter: Payer: Self-pay | Admitting: Podiatry

## 2015-12-28 ENCOUNTER — Ambulatory Visit (INDEPENDENT_AMBULATORY_CARE_PROVIDER_SITE_OTHER): Payer: 59 | Admitting: Podiatry

## 2015-12-28 VITALS — BP 116/73 | HR 116 | Resp 16

## 2015-12-28 DIAGNOSIS — Z9889 Other specified postprocedural states: Secondary | ICD-10-CM

## 2015-12-28 DIAGNOSIS — M2011 Hallux valgus (acquired), right foot: Secondary | ICD-10-CM

## 2015-12-28 MED ORDER — HYDROCODONE-ACETAMINOPHEN 5-325 MG PO TABS: 1.0000 | ORAL_TABLET | Freq: Four times a day (QID) | ORAL | Status: DC | PRN

## 2015-12-28 MED FILL — HYDROCODON-APAP 5-325: 5-325 | 5 days supply | Qty: 20 | Fill #0

## 2016-01-03 MED FILL — ZOLPIDEM TARTRATE 10 MG TAB: 10 | 30 days supply | Qty: 30 | Fill #1

## 2016-01-06 NOTE — Progress Notes (Signed)
  Patient ID: [name] [name], female   DOB: 1986-05-27, 30 y.o.   MRN: [id number]  Subjective: [name] [name] is a 30 y.o. is seen today in office s/p right Lapidus bunionectomy preformed on 10/08/15. since last appointment she has been going to physical therapy and she feels that she has made some significant improvement by going to therapy. She's been wearing a surgical shoe. Her pain is decreased as well as the swelling. She has no new concerns today. She is asking for refill of hydrocodone however as she denied town and she is running low. She has decreased female on the frequency that she has been taking this medicine however. Denies any systemic complaints such as fevers, chills, nausea, vomiting. No calf pain, chest pain, shortness of breath.   Objective: General: No acute distress, AAOx3  DP/PT pulses palpable 2/4, CRT < 3 sec to all digits.  Protective sensation intact. Motor function intact.  Right foot: Incision is well coapted without any evidence of dehiscence and scar has formed. Hallux continues to be in a rectus position. Range of motion of the first MTPJ intact with any restriction or crepitation. Decreased tenderness on the surgical site. There is decrease edema and there is no associated erythema or increase in warmth. No other areas of tenderness bilaterally. No other open lesions or pre-ulcerative lesions.  No pain with calf compression, swelling, warmth, erythema.   Assessment and Plan:  Status post right foot Lapidus bunionectomy  -Treatment options discussed including all alternatives, risks, and complications -At this time continue with physical therapy. She can start to transition to regular sneaker as tolerated. Do not go barefoot or flat shoes. Continue pain medication as needed and refill pain medications today. Ice and elevation. Compression anklet. Follow-up as scheduled or sooner if needed. Monitor for signs or symptoms of infection and directed to the ER should  any occur.   [name], DPM

## 2016-01-08 DIAGNOSIS — M79674 Pain in right toe(s): Secondary | ICD-10-CM | POA: Diagnosis not present

## 2016-01-08 DIAGNOSIS — M25474 Effusion, right foot: Secondary | ICD-10-CM | POA: Diagnosis not present

## 2016-01-08 DIAGNOSIS — M25674 Stiffness of right foot, not elsewhere classified: Secondary | ICD-10-CM | POA: Diagnosis not present

## 2016-01-08 DIAGNOSIS — M79671 Pain in right foot: Secondary | ICD-10-CM | POA: Diagnosis not present

## 2016-01-10 DIAGNOSIS — M25474 Effusion, right foot: Secondary | ICD-10-CM | POA: Diagnosis not present

## 2016-01-10 DIAGNOSIS — M25674 Stiffness of right foot, not elsewhere classified: Secondary | ICD-10-CM | POA: Diagnosis not present

## 2016-01-10 DIAGNOSIS — M79674 Pain in right toe(s): Secondary | ICD-10-CM | POA: Diagnosis not present

## 2016-01-10 DIAGNOSIS — M79671 Pain in right foot: Secondary | ICD-10-CM | POA: Diagnosis not present

## 2016-01-10 MED ORDER — TRAMADOL HCL 50 MG PO TABS: 50.0000 mg | ORAL_TABLET | Freq: Three times a day (TID) | ORAL | 0 refills | Status: DC | PRN

## 2016-01-10 MED FILL — traMADol HCL 50 MG TABS: 50 | 10 days supply | Qty: 30 | Fill #0

## 2016-01-10 NOTE — Telephone Encounter (Signed)
Can use tramadol 50mg  q8 hr prn

## 2016-01-10 NOTE — Telephone Encounter (Signed)
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  -Dr. Kathie Rhodes

## 2016-01-15 DIAGNOSIS — M79671 Pain in right foot: Secondary | ICD-10-CM | POA: Diagnosis not present

## 2016-01-15 DIAGNOSIS — M25674 Stiffness of right foot, not elsewhere classified: Secondary | ICD-10-CM | POA: Diagnosis not present

## 2016-01-15 DIAGNOSIS — M79674 Pain in right toe(s): Secondary | ICD-10-CM | POA: Diagnosis not present

## 2016-01-15 DIAGNOSIS — M25474 Effusion, right foot: Secondary | ICD-10-CM | POA: Diagnosis not present

## 2016-01-17 DIAGNOSIS — M25674 Stiffness of right foot, not elsewhere classified: Secondary | ICD-10-CM | POA: Diagnosis not present

## 2016-01-17 DIAGNOSIS — M25474 Effusion, right foot: Secondary | ICD-10-CM | POA: Diagnosis not present

## 2016-01-17 DIAGNOSIS — M79671 Pain in right foot: Secondary | ICD-10-CM | POA: Diagnosis not present

## 2016-01-17 DIAGNOSIS — M79674 Pain in right toe(s): Secondary | ICD-10-CM | POA: Diagnosis not present

## 2016-01-21 ENCOUNTER — Ambulatory Visit (INDEPENDENT_AMBULATORY_CARE_PROVIDER_SITE_OTHER): Payer: 59

## 2016-01-21 ENCOUNTER — Ambulatory Visit (INDEPENDENT_AMBULATORY_CARE_PROVIDER_SITE_OTHER): Payer: 59 | Admitting: Podiatry

## 2016-01-21 ENCOUNTER — Encounter: Payer: Self-pay | Admitting: Podiatry

## 2016-01-21 DIAGNOSIS — M2011 Hallux valgus (acquired), right foot: Secondary | ICD-10-CM

## 2016-01-21 DIAGNOSIS — Z09 Encounter for follow-up examination after completed treatment for conditions other than malignant neoplasm: Secondary | ICD-10-CM

## 2016-01-21 DIAGNOSIS — Z9889 Other specified postprocedural states: Secondary | ICD-10-CM

## 2016-01-21 MED ORDER — HYDROCODONE-ACETAMINOPHEN 5-325 MG PO TABS: 1.0000 | ORAL_TABLET | Freq: Four times a day (QID) | ORAL | 0 refills | Status: DC | PRN

## 2016-01-21 MED FILL — HYDROCODON-APAP 5-325: 5-325 | 5 days supply | Qty: 20 | Fill #0

## 2016-01-22 ENCOUNTER — Ambulatory Visit (INDEPENDENT_AMBULATORY_CARE_PROVIDER_SITE_OTHER): Payer: 59 | Admitting: Family

## 2016-01-22 ENCOUNTER — Encounter: Payer: Self-pay | Admitting: Family

## 2016-01-22 DIAGNOSIS — F418 Other specified anxiety disorders: Secondary | ICD-10-CM | POA: Diagnosis not present

## 2016-01-22 DIAGNOSIS — G479 Sleep disorder, unspecified: Secondary | ICD-10-CM

## 2016-01-22 DIAGNOSIS — F32A Depression, unspecified: Secondary | ICD-10-CM

## 2016-01-22 DIAGNOSIS — M79671 Pain in right foot: Secondary | ICD-10-CM | POA: Diagnosis not present

## 2016-01-22 DIAGNOSIS — F329 Major depressive disorder, single episode, unspecified: Secondary | ICD-10-CM

## 2016-01-22 DIAGNOSIS — M25674 Stiffness of right foot, not elsewhere classified: Secondary | ICD-10-CM | POA: Diagnosis not present

## 2016-01-22 DIAGNOSIS — M25474 Effusion, right foot: Secondary | ICD-10-CM | POA: Diagnosis not present

## 2016-01-22 DIAGNOSIS — M79674 Pain in right toe(s): Secondary | ICD-10-CM | POA: Diagnosis not present

## 2016-01-22 DIAGNOSIS — F419 Anxiety disorder, unspecified: Principal | ICD-10-CM

## 2016-01-22 MED ORDER — ZOLPIDEM TARTRATE 10 MG PO TABS: 10.0000 mg | ORAL_TABLET | Freq: Every evening | ORAL | 1 refills | Status: DC | PRN

## 2016-01-22 MED ORDER — DULOXETINE HCL 30 MG PO CPEP: 90.0000 mg | ORAL_CAPSULE | Freq: Every day | ORAL | 0 refills | Status: DC

## 2016-01-22 MED ORDER — CLONAZEPAM 0.5 MG PO TABS: ORAL_TABLET | ORAL | 0 refills | Status: DC

## 2016-01-22 MED FILL — DULoxetine HCL 30 MG CPEP: 30 | 90 days supply | Qty: 270 | Fill #0

## 2016-01-22 MED FILL — clonazePAM 0.5 MG TABS: 0.5 | 20 days supply | Qty: 60 | Fill #0

## 2016-01-22 NOTE — Progress Notes (Signed)
  Patient ID: [name] DixonSamantha D Hari, female   DOB: November 23, 1985, 30 y.o.   MRN: [id number]  Subjective: [name] [name] is a 30 y.o. is seen today in office s/p right Lapidus bunionectomy preformed on 10/08/15. She is continuing to do physical therapy. She feels her pain has improved though she does still get some discomfort and she points just proximal to the sesamoids on the plantar aspect of the right foot where she gets the majority pain. She has no pain to other areas of the foot. She feels that her big toe joint needs to pop. She is able to wear regular shoe. She is asking to go back to work at full duty. She is asking for refill of Vicodin today for about 2 weeks to take only at night if needed. Denies any systemic complaints such as fevers, chills, nausea, vomiting. No calf pain, chest pain, shortness of breath.   Objective: General: No acute distress, AAOx3  DP/PT pulses palpable 2/4, CRT < 3 sec to all digits.  Protective sensation intact. Motor function intact.  Right foot: Incision is well coapted without any evidence of dehiscence and scar has formed. Hallux continues to be in a rectus position in both weightbearing and nonweightbearing evaluation.  Range of motion of the first MTPJ intact with any restriction or crepitation.There is no tenderness palpation along the first metatarsal cuneiform site. Mild discomfort along the medial sesamoid plantarly. There is faint edema on surgical site without any erythema or increase in warmth.  No other open lesions or pre-ulcerative lesions.  No pain with calf compression, swelling, warmth, erythema.   Assessment and Plan:  Status post right foot Lapidus bunionectomy  -Treatment options discussed including all alternatives, risks, and complications -X-rays were obtained and reviewed with the patient. There is malalignment of the first metatarsal plantar joint. Increased consolidation across the first metatarsal cuneiform joint. -At this time I again  discussed thoroughly surgical intervention to help realign the first metatarsal-phalangeal joint to help prevent arthritis as well as hallux varus. At this point she states that she is doing well and her symptoms are improving and should to hold off on further surgery at this time. I discussed the long-term effects of this and she understands. If at any point she starts in his third toe is drifting to varus position with a discussed with her with the pain to the joint is not going away I discussed her surgical intervention to help realign the joint which are most likely involve soft tissue release. Continue to wear the toe brace to help hold the toe in rectus position. Continue supportive shoe gear. Continue physical therapy. This time she is requesting to go back to work at full duty note was provided today. I did provide offloading pads to sesamoids. -Follow-up in 6 weeks or sooner if any issues are to arise. Encouraged to call any questions or concerns or any change in symptoms.  Ovid CurdMatthew Devany Aja, DPM

## 2016-01-22 NOTE — Assessment & Plan Note (Signed)
Anxiety and depression are well controlled with current regimen and no adverse side effects. Using the medication appropriately. No suicidal ideations. Her mood appears stable on exam. Continue current dosage of duloxetine and clonazepam. [place] controlled [place] reviewed with no irregularities. Follow-up in 4 months or sooner if needed.

## 2016-01-22 NOTE — Patient Instructions (Signed)
Thank you for choosing [facility].  Summary/Instructions:  Please continue to take her medications as prescribed.  Your prescription(s) have been submitted to your pharmacy or been printed and provided for you. Please take as directed and contact our office if you believe you are having problem(s) with the medication(s) or have any questions.  If your symptoms worsen or fail to improve, please contact our office for further instruction, or in case of emergency go directly to the emergency room at the closest medical facility.

## 2016-01-22 NOTE — Progress Notes (Signed)
Subjective:    Patient ID: [name] [name], female    DOB: 03-12-1986, 30 y.o.   MRN: [id number]  Chief Complaint  Patient presents with  . Medication Refill    needs refill of all medications excpet for the trazadone    HPI:  [name] [name] is a 30 y.o. female who  has a past medical history of Anemia; Anxiety; DEPRESSION; GERD (gastroesophageal reflux disease); Headache(784.0); HYPOTHYROIDISM; Infertility, female (hx ); Morbid obesity (HCC); OBESITY; PCOS (polycystic ovarian syndrome); and Pregnancy induced hypertension. and presents today For a follow-up office visit.  1.) Anxiety/depression - this is a chronic problem. Currently maintained on duloxetine and clonazepam. Reports taking medication as prescribed and denies adverse side effects. Notes her symptoms are adequately controlled with current regimen. Reports taking the clonazepam on average about once a day and occasionally twice.   2.) Sleep disturbance - currently maintained on Ambien. Reports taking the medication as prescribed and denies adverse side effects. Sleeping approximately 7-8 hours per night with medication.   No Known Allergies   Current Outpatient Prescriptions on File Prior to Visit  Medication Sig Dispense Refill  . Acetaminophen (TYLENOL PO) Take 500 mg by mouth every 6 (six) hours as needed (pain).     . Calcium Citrate-Vitamin D (CALCIUM CITRATE + D PO) Take 1,200 mg by mouth 2 (two) times daily.     . ferrous sulfate 325 (65 FE) MG tablet Take 325 mg by mouth daily with breakfast.    . HYDROcodone-acetaminophen (NORCO/VICODIN) 5-325 MG tablet Take 1 tablet by mouth every 6 (six) hours as needed. 20 tablet 0  . Multiple Vitamin (MULTI-VITAMIN DAILY PO) Take 1 tablet by mouth 2 (two) times daily.     . ondansetron (ZOFRAN ODT) 4 MG disintegrating tablet Take 1 tablet (4 mg total) by mouth every 8 (eight) hours as needed for nausea. 10 tablet 0  . pantoprazole (PROTONIX) 40 MG tablet Take 40 mg by  mouth daily.    . polyethylene glycol (MIRALAX / GLYCOLAX) packet Take 17 g by mouth daily as needed.    . traZODone (DESYREL) 50 MG tablet TAKE 1/2-1 TABLET BY MOUTH AT BEDTIME AS NEEDED FOR SLEEP. 30 tablet 5  . vitamin B-12 (CYANOCOBALAMIN) 1000 MCG tablet Take 5,000 mcg by mouth daily.    . vitamin C (ASCORBIC ACID) 500 MG tablet Take 500 mg by mouth daily.     No current facility-administered medications on file prior to visit.      Review of Systems  Constitutional: Negative for chills and fever.  Respiratory: Negative for chest tightness and shortness of breath.   Cardiovascular: Negative for chest pain, palpitations and leg swelling.  Psychiatric/Behavioral: Negative for dysphoric mood, sleep disturbance and suicidal ideas. The patient is not nervous/anxious and is not hyperactive.       Objective:    BP 108/80 (BP Location: Right Arm, Patient Position: Sitting, Cuff Size: Large)   Pulse 71   Temp 98.1 F (36.7 C) (Oral)   Resp 16   Ht  (1.626 m)   Wt 202 lb (91.6 kg)   SpO2 97%   BMI 34.67 kg/m  Nursing note and vital signs reviewed.   Physical Exam  Constitutional: She is oriented to person, place, and time. She appears well-developed and well-nourished. No distress.  Cardiovascular: Normal rate, regular rhythm, normal heart sounds and intact distal pulses.   Pulmonary/Chest: Effort normal and breath sounds normal.  Neurological: She is alert and oriented to person, place,  and time.  Skin: Skin is warm and dry.  Psychiatric: She has a normal mood and affect. Her behavior is normal. Judgment and thought content normal.       Assessment & Plan:   Problem List Items Addressed This Visit      Other   Anxiety and depression    Anxiety and depression are well controlled with current regimen and no adverse side effects. Using the medication appropriately. No suicidal ideations. Her mood appears stable on exam. Continue current dosage of duloxetine and  clonazepam. [place] controlled substance database reviewed with no irregularities. Follow-up in 4 months or sooner if needed.      Relevant Medications   DULoxetine (CYMBALTA) 30 MG capsule   clonazePAM (KLONOPIN) 0.5 MG tablet   Sleep disturbance    Sleep appears stable with current regimen and no adverse side effects sleeping approximately 7 hours per night. Continue current dosage of Ambien.      Relevant Medications   zolpidem (AMBIEN) 10 MG tablet   clonazePAM (KLONOPIN) 0.5 MG tablet    Other Visit Diagnoses   None.      I have discontinued Ms. Spangle's DULoxetine, traMADol, promethazine, and traMADol. I have also changed her DULoxetine. Additionally, I am having her maintain her Multiple Vitamin (MULTI-VITAMIN DAILY PO), Acetaminophen (TYLENOL PO), Calcium Citrate-Vitamin D (CALCIUM CITRATE + D PO), ondansetron, vitamin C, pantoprazole, polyethylene glycol, vitamin B-12, ferrous sulfate, traZODone, HYDROcodone-acetaminophen, zolpidem, and clonazePAM.   Meds ordered this encounter  Medications  . zolpidem (AMBIEN) 10 MG tablet    Sig: Take 1 tablet (10 mg total) by mouth at bedtime as needed for sleep.    Dispense:  30 tablet    Refill:  1    Order Specific Question:   Supervising Provider    Answer:   Hillard DankerRAWFORD, ELIZABETH A [[id number]]  . DULoxetine (CYMBALTA) 30 MG capsule    Sig: Take 3 capsules (90 mg total) by mouth daily.    Dispense:  270 capsule    Refill:  0    Order Specific Question:   Supervising Provider    Answer:   Hillard DankerRAWFORD, ELIZABETH A [[id number]]  . clonazePAM (KLONOPIN) 0.5 MG tablet    Sig: Take 0.5-1 tablet daily by mouth as needed for anxiety and 1-2 tablets nightly by mouth as needed for sleep    Dispense:  60 tablet    Refill:  0    Order Specific Question:   Supervising Provider    Answer:   Hillard DankerRAWFORD, ELIZABETH A [[id number]]     Follow-up: Return in about 4 months (around 05/23/2016), or if symptoms worsen or fail to improve.  Jeanine Luzalone, Deaaron Fulghum,  FNP

## 2016-01-22 NOTE — Assessment & Plan Note (Signed)
Sleep appears stable with current regimen and no adverse side effects sleeping approximately 7 hours per night. Continue current dosage of Ambien.

## 2016-01-24 DIAGNOSIS — M25474 Effusion, right foot: Secondary | ICD-10-CM | POA: Diagnosis not present

## 2016-01-24 DIAGNOSIS — M79671 Pain in right foot: Secondary | ICD-10-CM | POA: Diagnosis not present

## 2016-01-24 DIAGNOSIS — M79674 Pain in right toe(s): Secondary | ICD-10-CM | POA: Diagnosis not present

## 2016-01-24 DIAGNOSIS — M25674 Stiffness of right foot, not elsewhere classified: Secondary | ICD-10-CM | POA: Diagnosis not present

## 2016-01-31 MED FILL — ZOLPIDEM TARTRATE 10 MG TAB: 10 | 30 days supply | Qty: 30 | Fill #0

## 2016-02-19 ENCOUNTER — Other Ambulatory Visit: Payer: Self-pay

## 2016-02-19 ENCOUNTER — Ambulatory Visit: Payer: Self-pay | Admitting: Hematology

## 2016-02-26 ENCOUNTER — Ambulatory Visit (INDEPENDENT_AMBULATORY_CARE_PROVIDER_SITE_OTHER): Payer: 59 | Admitting: Podiatry

## 2016-02-26 DIAGNOSIS — M2031 Hallux varus (acquired), right foot: Secondary | ICD-10-CM | POA: Diagnosis not present

## 2016-02-26 MED ORDER — HYDROCODONE-ACETAMINOPHEN 5-325 MG PO TABS: 1.0000 | ORAL_TABLET | Freq: Four times a day (QID) | ORAL | 0 refills | Status: DC | PRN

## 2016-02-26 MED FILL — HYDROCODON-APAP 5-325: 5-325 | 5 days supply | Qty: 20 | Fill #0

## 2016-02-26 NOTE — Progress Notes (Signed)
Subjective: 30 year old female presents the office today for pain to her right foot mostly with standing and working. She points along the lateral sesamoid on the plantar aspect of the foot where she gets the majority of pain. She states that she has tried offloading pads as well as shoe changes orthotics were having much relief. Denies any systemic complaints such as fevers, chills, nausea, vomiting. No acute changes since last appointment, and no other complaints at this time.   Objective: AAO x3, NAD DP/PT pulses palpable bilaterally, CRT less than 3 seconds There is tenderness along both the medial and lateral sesamoids have in the majority tenderness is over on the lateral sesamoid plantarly. There is no pain with MPJ range of motion and the toe appears to be in a rectus position. There is no pain on the first metatarsal cuneiform. No edema, erythema, increase in warmth to bilateral lower extremities.  No open lesions or pre-ulcerative lesions.  No pain with calf compression, swelling, warmth, erythema  Assessment: Hallux varus   Plan: -All treatment options discussed with the patient including all alternatives, risks, complications.  -Previous x-rays were reviewed with the patient. Given on x-ray there is a hallux varus although clinically the toe looks and great alignment as well as there is good range of motion to believe that surgical intervention this time  given the pain. Up with of the lateral sesamoids pressing on the crista causing pressure and pain. I discussed with her reverse Austin bunionectomy with screw fixation. She wishes to proceed with this. -Patient encouraged to call the office with any questions, concerns, change in symptoms.  -The incision placement as well as the postoperative course was discussed with the patient. I discussed risks of the surgery which include, but not limited to, infection, bleeding, pain, swelling, need for further surgery, delayed or nonhealing,  painful or ugly scar, numbness or sensation changes, over/under correction, recurrence, transfer lesions, further deformity, hardware failure, DVT/PE, loss of toe/foot. Patient understands these risks and wishes to proceed with surgery. The surgical consent was reviewed with the patient all 3 pages were signed. No promises or guarantees were given to the outcome of the procedure. All questions were answered to the best of my ability. Before the surgery the patient was encouraged to call the office if there is any further questions. The [facility] an outpatient basis.  Ovid CurdMatthew Josefita Weissmann, DPM

## 2016-02-26 NOTE — Patient Instructions (Signed)

## 2016-02-28 MED FILL — ZOLPIDEM TARTRATE 10 MG TAB: 10 | 30 days supply | Qty: 30 | Fill #1

## 2016-03-03 ENCOUNTER — Ambulatory Visit: Payer: 59 | Admitting: Podiatry

## 2016-03-11 ENCOUNTER — Encounter: Payer: Self-pay | Admitting: Family

## 2016-03-11 ENCOUNTER — Ambulatory Visit (INDEPENDENT_AMBULATORY_CARE_PROVIDER_SITE_OTHER): Payer: 59 | Admitting: Family

## 2016-03-11 ENCOUNTER — Telehealth: Payer: Self-pay | Admitting: Family

## 2016-03-11 VITALS — BP 114/84 | HR 98 | Temp 97.9°F | Resp 18 | Ht 64.0 in | Wt 204.0 lb

## 2016-03-11 DIAGNOSIS — J0100 Acute maxillary sinusitis, unspecified: Secondary | ICD-10-CM | POA: Diagnosis not present

## 2016-03-11 MED ORDER — PREDNISONE 20 MG PO TABS: 20.0000 mg | ORAL_TABLET | Freq: Two times a day (BID) | ORAL | 0 refills | Status: DC

## 2016-03-11 MED FILL — predniSONE 20 MG TABS: 20 | 5 days supply | Qty: 10 | Fill #0

## 2016-03-11 NOTE — Telephone Encounter (Signed)
[facility] - Client TELEPHONE ADVICE RECORD   [facility]     Patient Name: Claybon JabsSAMANTHA Deveney Initial Comment Caller has surgery friday and now has a cold-   DOB: 04-21-1986      Nurse Assessment  Nurse: Renaldo FiddlerAdkins, RN, Raynelle FanningJulie Date/Time (Eastern Time): 03/11/2016 2:16:02 PM  Confirm and document reason for call. If symptomatic, describe symptoms. You must click the next button to save text entered. ---Caller states she is scheduled for foot surgery this Friday and she has a cough/cold/earache and sore throat. No fever.  Has the patient traveled out of the country within the last 30 days? ---Not Applicable  Does the patient have any new or worsening symptoms? ---Yes  Will a triage be completed? ---Yes  Related visit to physician within the last 2 weeks? ---No  Does the PT have any chronic conditions? (i.e. diabetes, asthma, etc.) ---Yes  List chronic conditions. ---Depression, Gastric Bypass  Is the patient pregnant or possibly pregnant? (Ask all females between the ages of 4612-55) ---No  Is this a behavioral health or substance abuse call? ---No    Guidelines     Guideline Title Affirmed Question Affirmed Notes   Cough - Acute Productive SEVERE coughing spells (e.g., whooping sound after coughing, vomiting after coughing)    Final Disposition User   See Physician within 24 Hours Renaldo FiddlerAdkins, RN, Raynelle FanningJulie     Referrals   REFERRED TO PCP OFFICE   Disagree/Comply: Danella Maiersomply

## 2016-03-11 NOTE — Progress Notes (Signed)
Subjective:    Patient ID: [name] [name], female    DOB: [birth year], 30 y.o.   MRN: [id number]  Chief Complaint  Patient presents with  . Cough    sore throat, productive cough, ear ache, pressure in face, x2 days     HPI:  [name] [name] is a 30 y.o. female who  has a past medical history of Anemia; Anxiety; DEPRESSION; GERD (gastroesophageal reflux disease); Headache(784.0); HYPOTHYROIDISM; Infertility, female (hx ); Morbid obesity (HCC); OBESITY; PCOS (polycystic ovarian syndrome); and Pregnancy induced hypertension. and presents today for an acute office visit.   This is a new problem. Associated symptoms of sore throat, ear ache, productive cough and pressure in her face has been going on for about 3 days. No fevers. Modifying factors include Nyquil, dayquil, Benedryl, and guifenisen which have not helped very much. Course of the symptoms have slightly worsened since initial onset. No recent antibiotic.   No Known Allergies    Outpatient Medications Prior to Visit  Medication Sig Dispense Refill  . Acetaminophen (TYLENOL PO) Take 500 mg by mouth every 6 (six) hours as needed (pain).     . Calcium Citrate-Vitamin D (CALCIUM CITRATE + D PO) Take 1,200 mg by mouth 2 (two) times daily.     . clonazePAM (KLONOPIN) 0.5 MG tablet Take 0.5-1 tablet daily by mouth as needed for anxiety and 1-2 tablets nightly by mouth as needed for sleep 60 tablet 0  . DULoxetine (CYMBALTA) 30 MG capsule Take 3 capsules (90 mg total) by mouth daily. 270 capsule 0  . ferrous sulfate 325 (65 FE) MG tablet Take 325 mg by mouth daily with breakfast.    . HYDROcodone-acetaminophen (NORCO/VICODIN) 5-325 MG tablet Take 1 tablet by mouth every 6 (six) hours as needed. 20 tablet 0  . Multiple Vitamin (MULTI-VITAMIN DAILY PO) Take 1 tablet by mouth 2 (two) times daily.     . ondansetron (ZOFRAN ODT) 4 MG disintegrating tablet Take 1 tablet (4 mg total) by mouth every 8 (eight) hours as needed for nausea.  10 tablet 0  . pantoprazole (PROTONIX) 40 MG tablet Take 40 mg by mouth daily.    . polyethylene glycol (MIRALAX / GLYCOLAX) packet Take 17 g by mouth daily as needed.    . traZODone (DESYREL) 50 MG tablet TAKE 1/2-1 TABLET BY MOUTH AT BEDTIME AS NEEDED FOR SLEEP. 30 tablet 5  . vitamin B-12 (CYANOCOBALAMIN) 1000 MCG tablet Take 5,000 mcg by mouth daily.    . vitamin C (ASCORBIC ACID) 500 MG tablet Take 500 mg by mouth daily.    Marland Kitchen zolpidem (AMBIEN) 10 MG tablet Take 1 tablet (10 mg total) by mouth at bedtime as needed for sleep. 30 tablet 1   No facility-administered medications prior to visit.        Review of Systems  Constitutional: Negative for chills and fever.  HENT: Positive for congestion, ear pain, sinus pressure and sore throat.   Respiratory: Positive for cough. Negative for chest tightness and shortness of breath.   Neurological: Positive for headaches.      Objective:    BP 114/84 (BP Location: Left Arm, Patient Position: Sitting, Cuff Size: Large)   Pulse 98   Temp 97.9 F (36.6 C) (Oral)   Resp 18   Ht 5\' 4"  (1.626 m)   Wt 204 lb (92.5 kg)   SpO2 98%   BMI 35.02 kg/m  Nursing note and vital signs reviewed.  Physical Exam  Constitutional: She is oriented to  person, place, and time. She appears well-developed and well-nourished. No distress.  HENT:  Right Ear: Hearing, tympanic membrane, external ear and ear canal normal.  Left Ear: Hearing, tympanic membrane, external ear and ear canal normal.  Nose: Right sinus exhibits maxillary sinus tenderness and frontal sinus tenderness. Left sinus exhibits maxillary sinus tenderness and frontal sinus tenderness.  Mouth/Throat: Uvula is midline, oropharynx is clear and moist and mucous membranes are normal.  Neck: Neck supple.  Cardiovascular: Normal rate, regular rhythm, normal heart sounds and intact distal pulses.   Pulmonary/Chest: Effort normal and breath sounds normal.  Neurological: She is alert and oriented to  person, place, and time.  Skin: Skin is warm and dry.  Psychiatric: She has a normal mood and affect. Her behavior is normal. Judgment and thought content normal.       Assessment & Plan:   Problem List Items Addressed This Visit      Respiratory   Sinusitis, acute - Primary    Symptoms and exam consistent with developing sinusitis. Treat conservatively with over-the-counter medications as needed for symptom relief and supportive care. Start prednisone as needed for congestion and inflammation. Follow-up if symptoms worsen or do not improve.      Relevant Medications   predniSONE (DELTASONE) 20 MG tablet    Other Visit Diagnoses   None.      I am having Ms. Deroo start on predniSONE. I am also having her maintain her Multiple Vitamin (MULTI-VITAMIN DAILY PO), Acetaminophen (TYLENOL PO), Calcium Citrate-Vitamin D (CALCIUM CITRATE + D PO), ondansetron, vitamin C, pantoprazole, polyethylene glycol, vitamin B-12, ferrous sulfate, traZODone, zolpidem, DULoxetine, clonazePAM, and HYDROcodone-acetaminophen.   Meds ordered this encounter  Medications  . predniSONE (DELTASONE) 20 MG tablet    Sig: Take 1 tablet (20 mg total) by mouth 2 (two) times daily with a meal.    Dispense:  10 tablet    Refill:  0    Order Specific Question:   Supervising Provider    Answer:   Hillard DankerRAWFORD, ELIZABETH A [[id number]]     Follow-up: Return if symptoms worsen or fail to improve.  Jeanine Luzalone, Gregory, FNP

## 2016-03-11 NOTE — Patient Instructions (Signed)
Thank you for choosing [facility].  SUMMARY AND INSTRUCTIONS:  Medication:  Your prescription(s) have been submitted to your pharmacy or been printed and provided for you. Please take as directed and contact our office if you believe you are having problem(s) with the medication(s) or have any questions.   Follow up:  If your symptoms worsen or fail to improve, please contact our office for further instruction, or in case of emergency go directly to the emergency room at the closest medical facility.    General Recommendations:    Please drink plenty of fluids.  Get plenty of rest   Sleep in humidified air  Use saline nasal sprays  Netti pot   OTC Medications:  Decongestants - helps relieve congestion   Flonase (generic fluticasone) or Nasacort (generic triamcinolone) - please make sure to use the "cross-over" technique at a 45 degree angle towards the opposite eye as opposed to straight up the nasal passageway.   Sudafed (generic pseudoephedrine - Note this is the one that is available behind the pharmacy counter); Products with phenylephrine (-PE) may also be used but is often not as effective as pseudoephedrine.   If you have HIGH BLOOD PRESSURE - Coricidin HBP; AVOID any product that is -D as this contains pseudoephedrine which may increase your blood pressure.  Afrin (oxymetazoline) every 6-8 hours for up to 3 days.   Allergies - helps relieve runny nose, itchy eyes and sneezing   Claritin (generic loratidine), Allegra (fexofenidine), or Zyrtec (generic cyrterizine) for runny nose. These medications should not cause drowsiness.  Note - Benadryl (generic diphenhydramine) may be used however may cause drowsiness  Cough -   Delsym or Robitussin (generic dextromethorphan)  Expectorants - helps loosen mucus to ease removal   Mucinex (generic guaifenesin) as directed on the package.  Headaches / General Aches   Tylenol (generic acetaminophen) - DO  NOT EXCEED 3 grams (3,000 mg) in a 24 hour time period  Advil/Motrin (generic ibuprofen)   Sore Throat -   Salt water gargle   Chloraseptic (generic benzocaine) spray or lozenges / Sucrets (generic dyclonine)    Sinusitis Sinusitis is redness, soreness, and inflammation of the paranasal sinuses. Paranasal sinuses are air pockets within the bones of your face (beneath the eyes, the middle of the forehead, or above the eyes). In healthy paranasal sinuses, mucus is able to drain out, and air is able to circulate through them by way of your nose. However, when your paranasal sinuses are inflamed, mucus and air can become trapped. This can allow bacteria and other germs to grow and cause infection. Sinusitis can develop quickly and last only a short time (acute) or continue over a long period (chronic). Sinusitis that lasts for more than 12 weeks is considered chronic.  CAUSES  Causes of sinusitis include:  Allergies.  Structural abnormalities, such as displacement of the cartilage that separates your nostrils (deviated septum), which can decrease the air flow through your nose and sinuses and affect sinus drainage.  Functional abnormalities, such as when the small hairs (cilia) that line your sinuses and help remove mucus do not work properly or are not present. SIGNS AND SYMPTOMS  Symptoms of acute and chronic sinusitis are the same. The primary symptoms are pain and pressure around the affected sinuses. Other symptoms include:  Upper toothache.  Earache.  Headache.  Bad breath.  Decreased sense of smell and taste.  A cough, which worsens when you are lying flat.  Fatigue.  Fever.  Thick drainage   from your nose, which often is green and may contain pus (purulent).  Swelling and warmth over the affected sinuses. DIAGNOSIS  Your health care provider will perform a physical exam. During the exam, your health care provider may:  Look in your nose for signs of abnormal growths  in your nostrils (nasal polyps).  Tap over the affected sinus to check for signs of infection.  View the inside of your sinuses (endoscopy) using an imaging device that has a light attached (endoscope). If your health care provider suspects that you have chronic sinusitis, one or more of the following tests may be recommended:  Allergy tests.  Nasal culture. A sample of mucus is taken from your nose, sent to a lab, and screened for bacteria.  Nasal cytology. A sample of mucus is taken from your nose and examined by your health care provider to determine if your sinusitis is related to an allergy. TREATMENT  Most cases of acute sinusitis are related to a viral infection and will resolve on their own within 10 days. Sometimes medicines are prescribed to help relieve symptoms (pain medicine, decongestants, nasal steroid sprays, or saline sprays).  However, for sinusitis related to a bacterial infection, your health care provider will prescribe antibiotic medicines. These are medicines that will help kill the bacteria causing the infection.  Rarely, sinusitis is caused by a fungal infection. In theses cases, your health care provider will prescribe antifungal medicine. For some cases of chronic sinusitis, surgery is needed. Generally, these are cases in which sinusitis recurs more than 3 times per year, despite other treatments. HOME CARE INSTRUCTIONS   Drink plenty of water. Water helps thin the mucus so your sinuses can drain more easily.  Use a humidifier.  Inhale steam 3 to 4 times a day (for example, sit in the bathroom with the shower running).  Apply a warm, moist washcloth to your face 3 to 4 times a day, or as directed by your health care provider.  Use saline nasal sprays to help moisten and clean your sinuses.  Take medicines only as directed by your health care provider.  If you were prescribed either an antibiotic or antifungal medicine, finish it all even if you start to feel  better. SEEK IMMEDIATE MEDICAL CARE IF:  You have increasing pain or severe headaches.  You have nausea, vomiting, or drowsiness.  You have swelling around your face.  You have vision problems.  You have a stiff neck.  You have difficulty breathing. MAKE SURE YOU:   Understand these instructions.  Will watch your condition.  Will get help right away if you are not doing well or get worse. Document Released: 06/02/2005 Document Revised: 10/17/2013 Document Reviewed: 06/17/2011 Exit[employer] Patient Information 2015 [employer]. This information is not intended to replace advice given to you by your health care provider. Make sure you discuss any questions you have with your health care provider.   

## 2016-03-11 NOTE — Assessment & Plan Note (Signed)
Symptoms and exam consistent with developing sinusitis. Treat conservatively with over-the-counter medications as needed for symptom relief and supportive care. Start prednisone as needed for congestion and inflammation. Follow-up if symptoms worsen or do not improve.

## 2016-03-14 ENCOUNTER — Encounter: Payer: Self-pay | Admitting: Podiatry

## 2016-03-14 ENCOUNTER — Other Ambulatory Visit: Payer: Self-pay

## 2016-03-14 DIAGNOSIS — M2011 Hallux valgus (acquired), right foot: Secondary | ICD-10-CM | POA: Diagnosis not present

## 2016-03-14 DIAGNOSIS — K259 Gastric ulcer, unspecified as acute or chronic, without hemorrhage or perforation: Secondary | ICD-10-CM | POA: Diagnosis not present

## 2016-03-14 DIAGNOSIS — M25571 Pain in right ankle and joints of right foot: Secondary | ICD-10-CM | POA: Diagnosis not present

## 2016-03-14 DIAGNOSIS — M2031 Hallux varus (acquired), right foot: Secondary | ICD-10-CM | POA: Diagnosis not present

## 2016-03-14 MED FILL — traZODone HCL 50 MG TABS: 50 | 30 days supply | Qty: 30 | Fill #0

## 2016-03-14 MED FILL — PROMETHAZINE 25 MG TABLET: 25 | 10 days supply | Qty: 30 | Fill #0

## 2016-03-14 MED FILL — CLINDAMYCIN HCL 150 MG CAP: 150 | 10 days supply | Qty: 30 | Fill #0

## 2016-03-14 MED FILL — OXYCODONE/APAP 5-325: 5-325 | 7 days supply | Qty: 40 | Fill #0

## 2016-03-18 ENCOUNTER — Ambulatory Visit (INDEPENDENT_AMBULATORY_CARE_PROVIDER_SITE_OTHER): Payer: 59 | Admitting: Podiatry

## 2016-03-18 ENCOUNTER — Ambulatory Visit (HOSPITAL_BASED_OUTPATIENT_CLINIC_OR_DEPARTMENT_OTHER)
Admission: RE | Admit: 2016-03-18 | Discharge: 2016-03-18 | Disposition: A | Discharge status: Home / Self Care | Payer: 59 | Source: Ambulatory Visit | Attending: Podiatry | Admitting: Podiatry

## 2016-03-18 ENCOUNTER — Encounter: Payer: Self-pay | Admitting: Podiatry

## 2016-03-18 DIAGNOSIS — Z981 Arthrodesis status: Secondary | ICD-10-CM | POA: Diagnosis not present

## 2016-03-18 DIAGNOSIS — M2031 Hallux varus (acquired), right foot: Secondary | ICD-10-CM

## 2016-03-18 DIAGNOSIS — Z4789 Encounter for other orthopedic aftercare: Secondary | ICD-10-CM | POA: Diagnosis not present

## 2016-03-18 DIAGNOSIS — Z09 Encounter for follow-up examination after completed treatment for conditions other than malignant neoplasm: Secondary | ICD-10-CM

## 2016-03-18 DIAGNOSIS — M2141 Flat foot [pes planus] (acquired), right foot: Secondary | ICD-10-CM | POA: Insufficient documentation

## 2016-03-18 DIAGNOSIS — M7731 Calcaneal spur, right foot: Secondary | ICD-10-CM | POA: Diagnosis not present

## 2016-03-18 IMAGING — DX DG FOOT COMPLETE 3+V*R*
3 series · 3 of 3 positions shown · non-contrast
Comparison: [DATE]

CLINICAL DATA: Pes planus. Prior bunion surgery [DATE].
Postop for follow up.

EXAM:
RIGHT FOOT COMPLETE - 3+ VIEW

[foot ap]
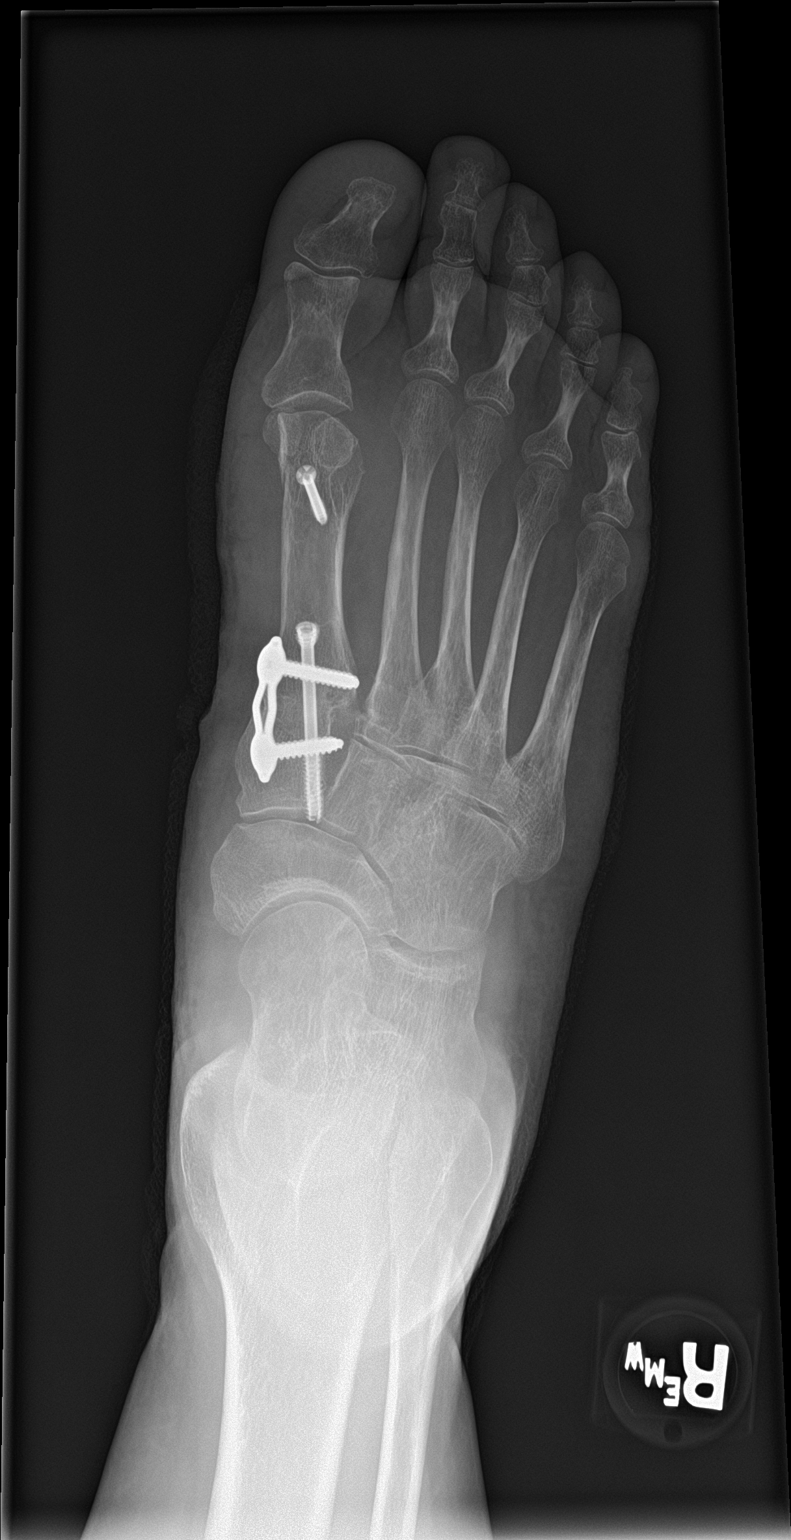

[foot obl]
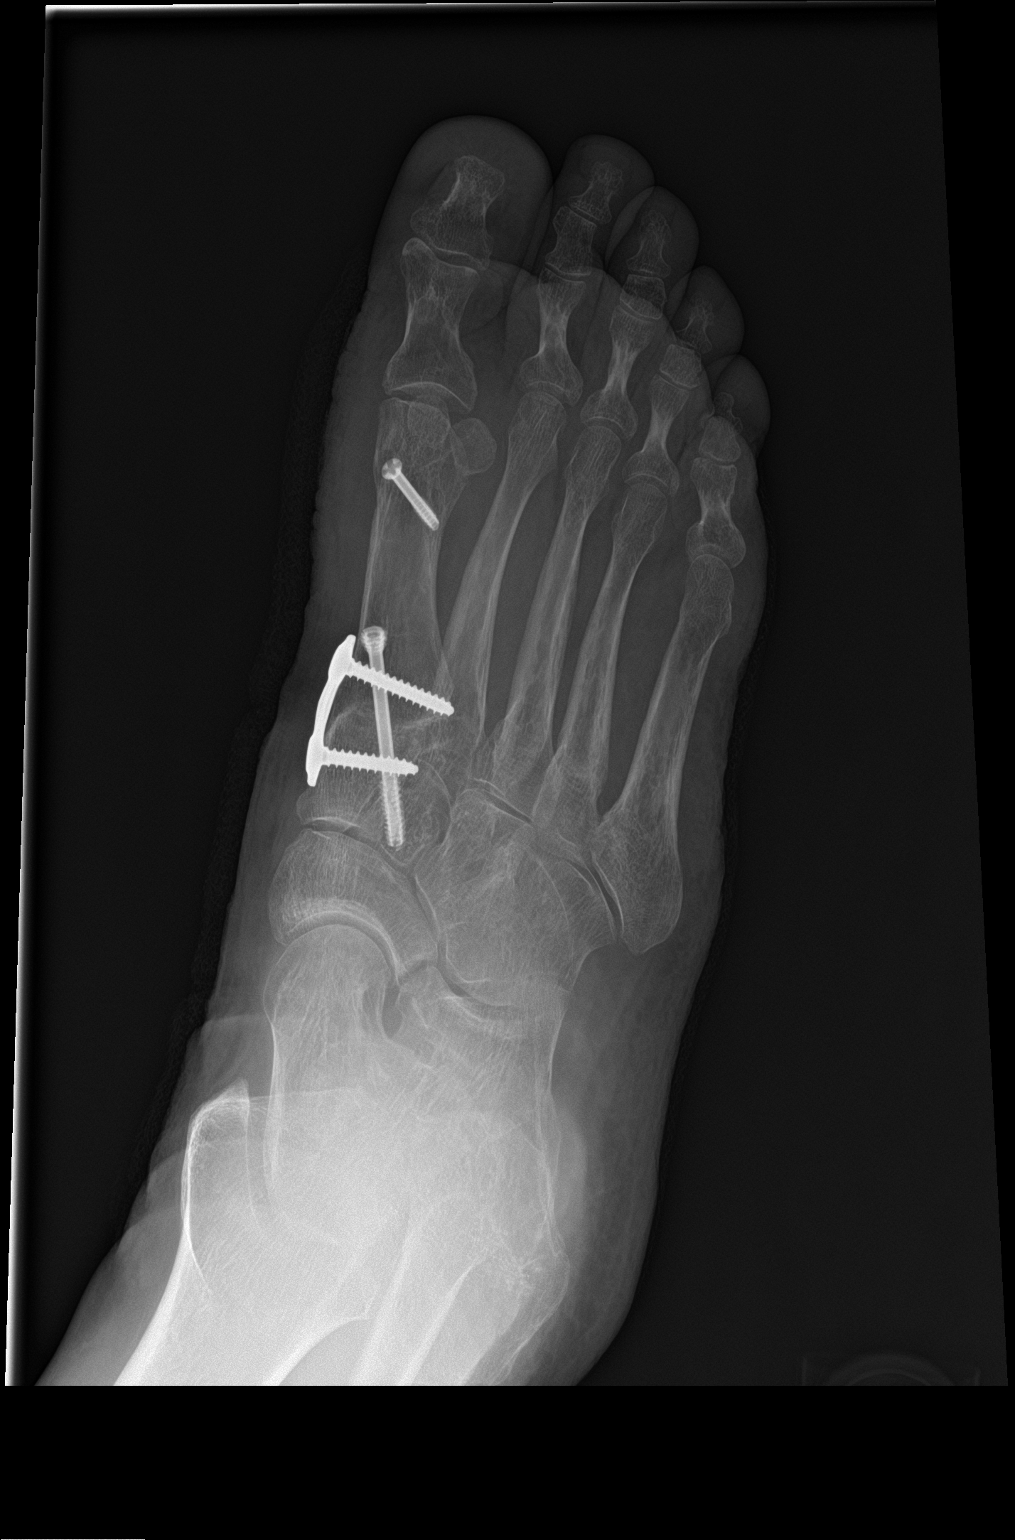

[foot lat]
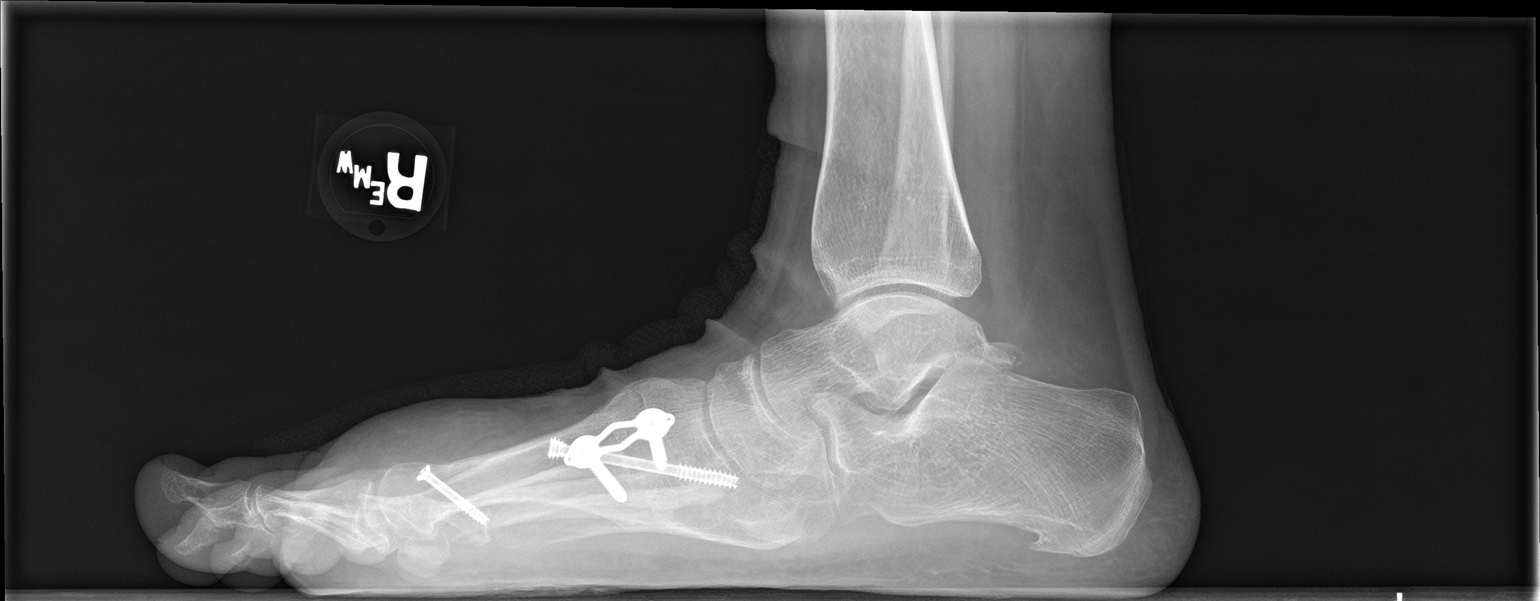

[3 of 3 positions shown; findings below may reference images not displayed]

FINDINGS: There is a new distal screw in the first metatarsal implying
interval osteotomy. A prior bony bunionectomy along the first
metatarsal head medially. Soft tissue swelling and bandaging
medially. Alignment at the first MTP joint is improved.

Plate and screw fusion of the articulation between the medial
cuneiform and base of the first metatarsal noted with solid bony
fusion and a retrograde cannulated screw extending back to the
proximal -lateral articular margin of the medial cuneiform.

No malalignment at the Lisfranc joint. No significant abnormal gas
tracking in the soft tissues, or bony destructive findings. Lateral
projection demonstrates pes planus. There is soft tissue swelling
along the distal dorsum of the foot, not unexpected in the
postoperative setting.

Plantar calcaneal spur.
IMPRESSION: 1. New oblique retrograde screw in the distal first metatarsal
likely indicating osteotomy/bunionectomy revision. Improved
alignment at the first MTP joint.
2. Pre-existing fusion of the base of the first metatarsal and
medial cuneiform.
3. Pes planus.

## 2016-03-18 MED ORDER — HYDROCODONE-ACETAMINOPHEN 5-325 MG PO TABS: 1.0000 | ORAL_TABLET | Freq: Four times a day (QID) | ORAL | 0 refills | Status: DC | PRN

## 2016-03-18 MED FILL — HYDROCODON-APAP 5-325: 5-325 | 5 days supply | Qty: 30 | Fill #0

## 2016-03-18 NOTE — Progress Notes (Signed)
Subjective: Josie DixonSamantha D Wakeley is a 30 y.o. is seen today in office s/p  Right foot reverse Austin bunionectomy preformed on 03/14/16. They state their pain is minimal and she is asking for vicodin as  Opposed to percocet. She has remained in the CAM boot. She has been taking antibiotics. Denies any systemic complaints such as fevers, chills, nausea, vomiting. No calf pain, chest pain, shortness of breath.   Objective: General: No acute distress, AAOx3  DP/PT pulses palpable 2/4, CRT < 3 sec to all digits.  Protective sensation intact. Motor function intact.  Right foot: Incision is well coapted without any evidence of dehiscence and sutures intact. There is no surrounding erythema, ascending cellulitis, fluctuance, crepitus, malodor, drainage/purulence. There is mild edema around the surgical site. There is minimla pain along the surgical site. There is no pain with MPJ range of motion. Hallux sits in rectus position. No other areas of tenderness to bilateral lower extremities.  No other open lesions or pre-ulcerative lesions.  No pain with calf compression, swelling, warmth, erythema.   Assessment and Plan:  Status post right reverse Austin bunionectomy, doing well with no complications   -Treatment options discussed including all alternatives, risks, and complications -X-rays were ordered today. -Antibiotic ointment and a bandage was applied. Keep the dressing clean, dry, intact -Ice/elevation -Pain medication as needed. Prescribed Vicodin today. -Continue cam boot. Hold off on driving. -Monitor for any clinical signs or symptoms of infection and DVT/PE and directed to call the office immediately should any occur or go to the ER. -Follow-up in 10 days for suture removal or sooner if any problems arise. In the meantime, encouraged to call the office with any questions, concerns, change in symptoms.   Ovid CurdMatthew Shandi Godfrey, DPM

## 2016-03-24 NOTE — Progress Notes (Signed)
DOS 09.29.2017  Right Foot Reverse Austin Bunionectomy with Screw Fixation

## 2016-03-26 ENCOUNTER — Other Ambulatory Visit: Payer: Self-pay | Admitting: Family

## 2016-03-26 DIAGNOSIS — G479 Sleep disorder, unspecified: Secondary | ICD-10-CM

## 2016-03-26 MED ORDER — TRAMADOL HCL 50 MG PO TABS: 50.0000 mg | ORAL_TABLET | Freq: Three times a day (TID) | ORAL | 0 refills | Status: DC | PRN

## 2016-03-26 MED FILL — traMADol HCL 50 MG TABS: 50 | 10 days supply | Qty: 30 | Fill #0

## 2016-03-26 NOTE — Telephone Encounter (Signed)
Rx faxed

## 2016-03-26 NOTE — Telephone Encounter (Signed)
Last refill was 01/22/16 

## 2016-03-27 MED FILL — ZOLPIDEM TARTRATE 10 MG TAB: 10 | 30 days supply | Qty: 30 | Fill #0

## 2016-04-01 ENCOUNTER — Encounter: Payer: Self-pay | Admitting: Podiatry

## 2016-04-01 ENCOUNTER — Ambulatory Visit (INDEPENDENT_AMBULATORY_CARE_PROVIDER_SITE_OTHER): Payer: 59 | Admitting: Podiatry

## 2016-04-01 VITALS — BP 130/95 | HR 92 | Resp 16

## 2016-04-01 DIAGNOSIS — Z09 Encounter for follow-up examination after completed treatment for conditions other than malignant neoplasm: Secondary | ICD-10-CM

## 2016-04-01 DIAGNOSIS — M2031 Hallux varus (acquired), right foot: Secondary | ICD-10-CM

## 2016-04-01 MED ORDER — OXYCODONE-ACETAMINOPHEN 5-325 MG PO TABS: 1.0000 | ORAL_TABLET | ORAL | 0 refills | Status: DC | PRN

## 2016-04-01 MED FILL — OXYCODONE/APAP 5/325 MG TAB: 5-325 | 5 days supply | Qty: 30 | Fill #0

## 2016-04-01 NOTE — Addendum Note (Signed)
Addended by: Ovid CurdWAGONER, MATTHEW R on: 04/01/2016 09:42 AM   Modules accepted: Orders

## 2016-04-01 NOTE — Progress Notes (Signed)
Subjective: [name] DixonSamantha D [name] is a 30 y.o. is seen today in office s/p Right foot reverse Austin bunionectomy preformed on 03/14/16. She presents today for suture removal. She is continuing to wear the CAM boot. She states that she feels "100 times better" compared to what it felt like before surgery. 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 She has been icing and elevating as much as possible.  Denies any systemic complaints such as fevers, chills, nausea, vomiting. No calf pain, chest pain, shortness of breath.   Objective: General: No acute distress, AAOx3  DP/PT pulses palpable 2/4, CRT < 3 sec to all digits.  Protective sensation intact. Motor function intact.  Right foot: Incision is well coapted without any evidence of dehiscence and sutures intact. There is no surrounding erythema, ascending cellulitis, fluctuance, crepitus, malodor, drainage/purulence. There is mild edema around the surgical site. There is minimal pain along the surgical site. There is no pain with MPJ range of motion. Hallux sits in rectus position. There is currently no pain along the sesamoids today or with ROM of the 1st MTPJ.  No other areas of tenderness to bilateral lower extremities.  No other open lesions or pre-ulcerative lesions.  No pain with calf compression, swelling, warmth, erythema.   Assessment and Plan:  Status post right reverse Austin bunionectomy, doing well with no complications   -Treatment options discussed including all alternatives, risks, and complications -Sutures were removed today without complications. Antibiotic ointment and a bandage was applied. She can start to shower tomorrow.  -Continue cam boot. Weightbearing as tolerated. No driving. -Ice and elevation -Monitor for any clinical signs or symptoms of infection and DVT/PE and directed to call the office immediately should any occur or go to the ER. -Follow-up in 2 weeks or sooner if any problems arise. In the meantime,  encouraged to call the office with any questions, concerns, change in symptoms.   Ovid CurdMatthew Wagoner, DPM

## 2016-04-04 ENCOUNTER — Ambulatory Visit (INDEPENDENT_AMBULATORY_CARE_PROVIDER_SITE_OTHER): Payer: 59

## 2016-04-04 DIAGNOSIS — Z23 Encounter for immunization: Secondary | ICD-10-CM

## 2016-04-09 MED ORDER — TRAMADOL HCL 50 MG PO TABS: 50.0000 mg | ORAL_TABLET | Freq: Three times a day (TID) | ORAL | 0 refills | Status: DC | PRN

## 2016-04-18 MED FILL — traZODone HCL 50 MG TABS: 50 | 30 days supply | Qty: 30 | Fill #1

## 2016-04-22 ENCOUNTER — Ambulatory Visit (INDEPENDENT_AMBULATORY_CARE_PROVIDER_SITE_OTHER): Payer: 59 | Admitting: Podiatry

## 2016-04-22 ENCOUNTER — Ambulatory Visit (HOSPITAL_BASED_OUTPATIENT_CLINIC_OR_DEPARTMENT_OTHER)
Admission: RE | Admit: 2016-04-22 | Discharge: 2016-04-22 | Disposition: A | Discharge status: Home / Self Care | Payer: 59 | Source: Ambulatory Visit | Attending: Podiatry | Admitting: Podiatry

## 2016-04-22 ENCOUNTER — Encounter: Payer: Self-pay | Admitting: Podiatry

## 2016-04-22 DIAGNOSIS — M19071 Primary osteoarthritis, right ankle and foot: Secondary | ICD-10-CM | POA: Diagnosis not present

## 2016-04-22 DIAGNOSIS — M216X9 Other acquired deformities of unspecified foot: Secondary | ICD-10-CM

## 2016-04-22 DIAGNOSIS — Z9889 Other specified postprocedural states: Secondary | ICD-10-CM | POA: Insufficient documentation

## 2016-04-22 DIAGNOSIS — M2031 Hallux varus (acquired), right foot: Secondary | ICD-10-CM

## 2016-04-22 DIAGNOSIS — Z09 Encounter for follow-up examination after completed treatment for conditions other than malignant neoplasm: Secondary | ICD-10-CM

## 2016-04-22 DIAGNOSIS — M722 Plantar fascial fibromatosis: Secondary | ICD-10-CM

## 2016-04-22 IMAGING — DX DG FOOT COMPLETE 3+V*R*
3 series · 3 of 3 positions shown · non-contrast
Comparison: Plain films of the right foot [DATE] and
[DATE].

CLINICAL DATA: Status post bunionectomy [DATE]. Subsequent
encounter.

EXAM:
RIGHT FOOT COMPLETE - 3+ VIEW

[foot ap]
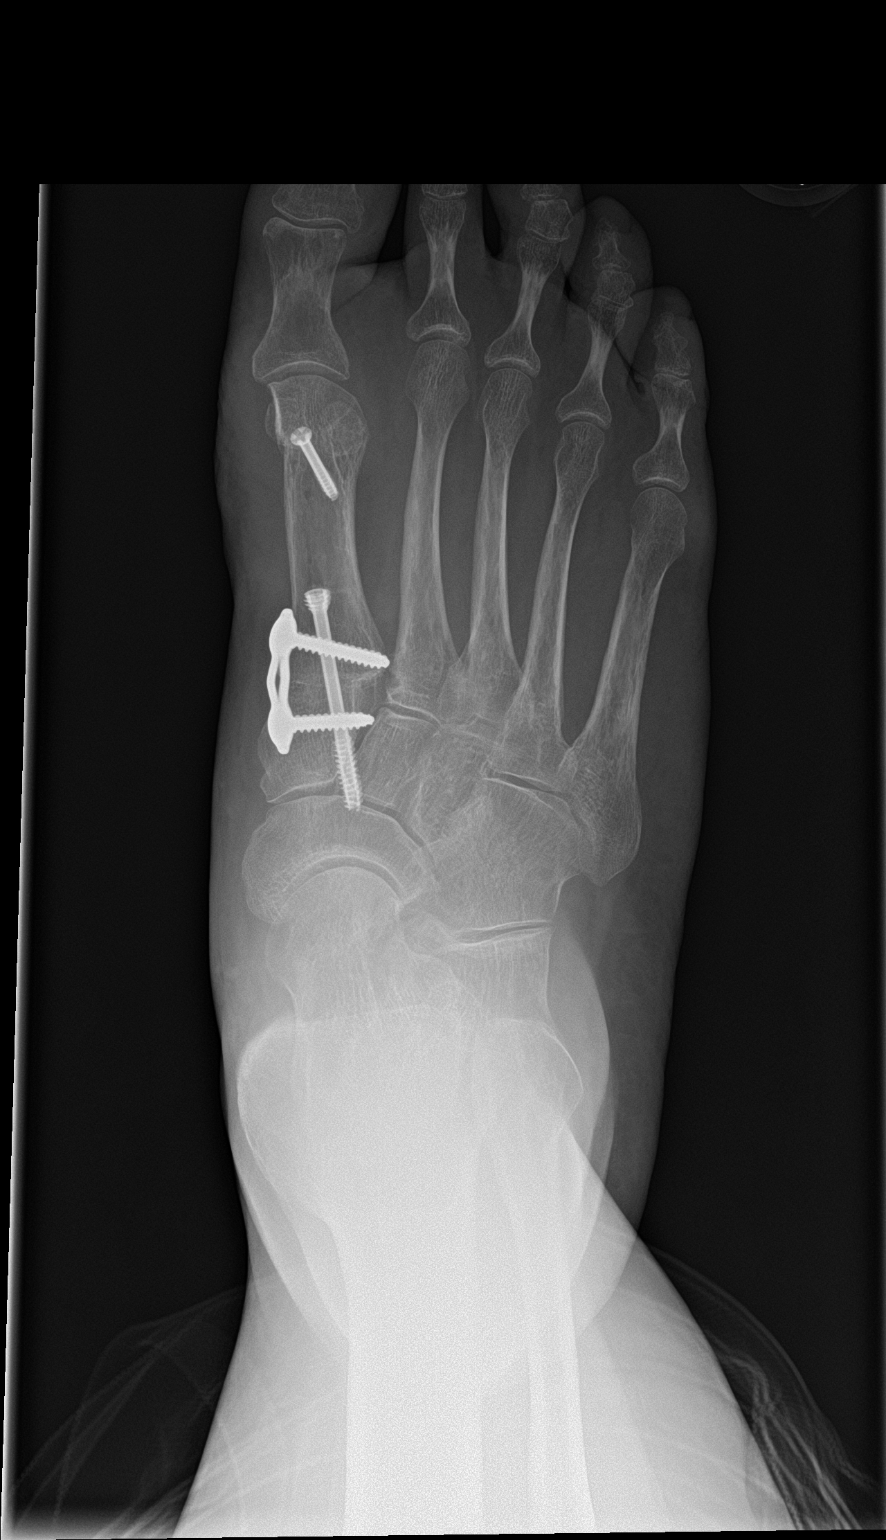

[foot obl]
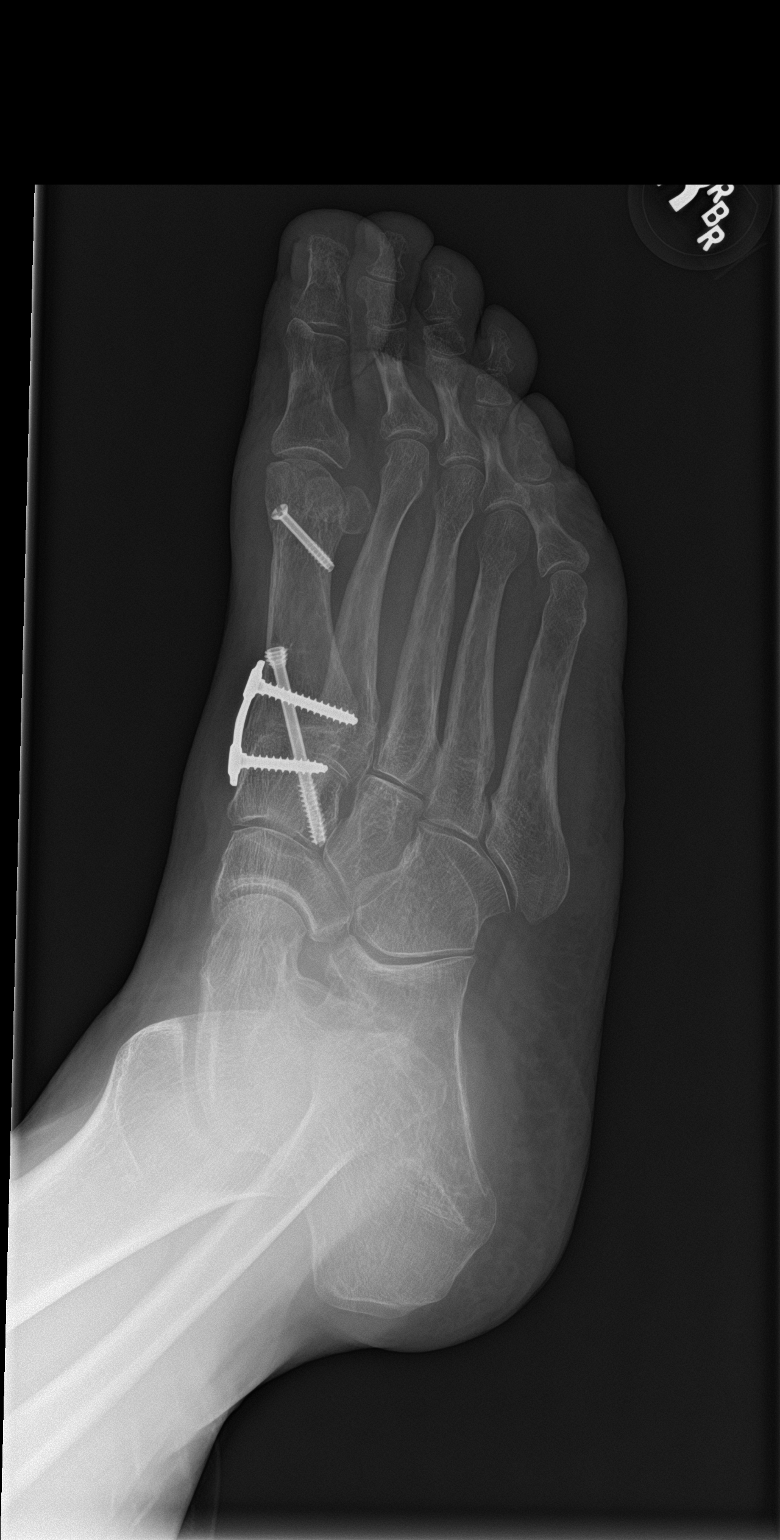

[foot lat]
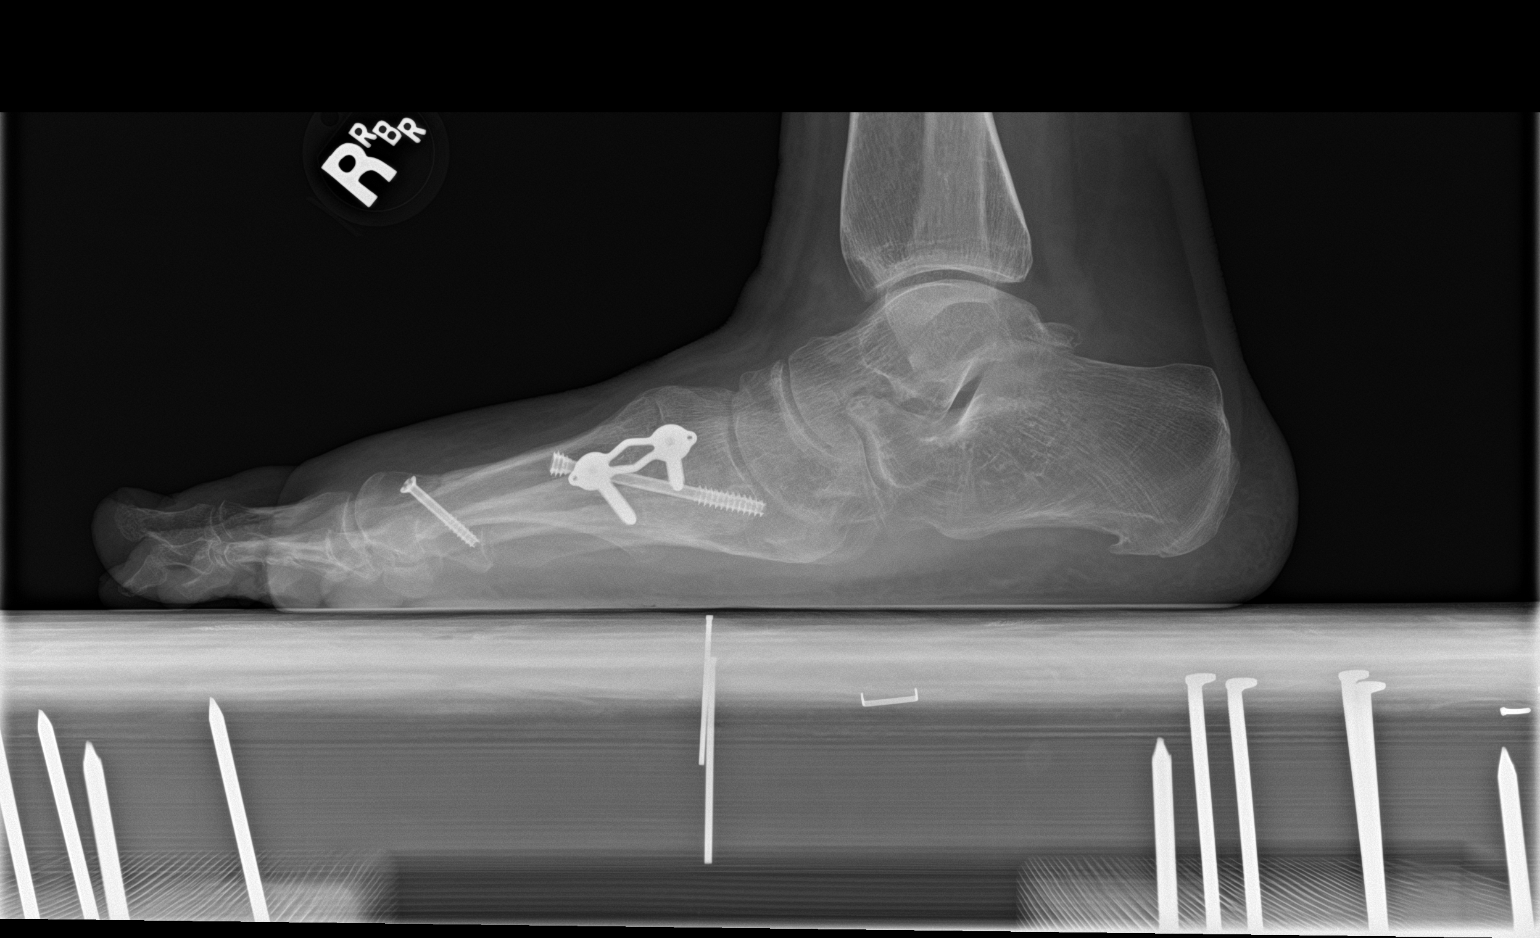

[3 of 3 positions shown; findings below may reference images not displayed]

FINDINGS: Again seen is postoperative change of bunionectomy with a screw in
the distal first metatarsal and fusion of the first tarsometatarsal
joint. Hardware is intact and unchanged in position. No evidence of
loosening is identified. No acute bony or joint abnormality is seen.
Pes planus is noted as on the prior study. Small plantar calcaneal
spur is again seen.
IMPRESSION: Postoperative changes described.  No acute abnormality.

## 2016-04-22 MED ORDER — HYDROCODONE-ACETAMINOPHEN 5-325 MG PO TABS: 1.0000 | ORAL_TABLET | Freq: Four times a day (QID) | ORAL | 0 refills | Status: DC | PRN

## 2016-04-22 MED FILL — HYDROCODON-APAP 5-325: 5-325 | 8 days supply | Qty: 30 | Fill #0

## 2016-04-22 NOTE — Progress Notes (Signed)
Subjective: [name] [name] is a 30 y.o. is seen today in office s/p Right foot reverse Austin bunionectomy preformed on 03/14/16. She presents today for suture removal. His been continuing to wear the CAM boot. She states she gets occasional soreness but overall feels much better than prior to the surgery. She is having some pain in the left bunion is to have surgery for the future. 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  Denies any systemic complaints such as fevers, chills, nausea, vomiting. No calf pain, chest pain, shortness of breath.   Objective: General: No acute distress, AAOx3  DP/PT pulses palpable 2/4, CRT < 3 sec to all digits.  Protective sensation intact. Motor function intact.  Right foot: Incision is well coapted without any evidence of dehiscence and a scar has formed. There is no surrounding erythema, ascending cellulitis, fluctuance, crepitus, malodor, drainage/purulence. There is minimal edema around the surgical site. There is minimal pain along the surgical site. There is no pain with MPJ range of motion. Hallux sits in rectus position. There is currently no pain along the sesamoids today or with ROM of the 1st MTPJ. There is adequate range of motion dorsiflexion the MPJ however somewhat limited in plantar flexion. Prominence the metatarsal heads plantarly with atrophy of the fat pad. Mild tenderness on medial band plantar fascia right foot. No other areas of tenderness to bilateral lower extremities.  No other open lesions or pre-ulcerative lesions.  No pain with calf compression, swelling, warmth, erythema.   Assessment and Plan:  Status post right reverse Austin bunionectomy, doing well with no complications   -Treatment options discussed including all alternatives, risks, and complications -X-rays were obtained. Increased consolidation across the osteotomy site. Hardware intact. -At this time she inserted transition to regular shoe as tolerated in the house and she  can gradually increase in wearing a regular shoe at work however over the next week to 2 she should wear the cam boot while at work. -Continue ice and elevation -Range of motion rehabilitation exercises. She remembers is from physical therapy previously she will do this on her own at home.  -Refilled hydrocodone pain -[facility] was [facility] -Follow-up in 3 weeks or sooner if any issues are to arise.  Ovid CurdMatthew Kenry Daubert, DPM   -Sutures were removed today without complications. Antibiotic ointment and a bandage was applied. She can start to shower tomorrow.  -Continue cam boot. Weightbearing as tolerated. No driving. -Ice and elevation -Monitor for any clinical signs or symptoms of infection and DVT/PE and directed to call the office immediately should any occur or go to the ER. -Follow-up in 2 weeks or sooner if any problems arise. In the meantime, encouraged to call the office with any questions, concerns, change in symptoms.   Ovid CurdMatthew Dietrich Samuelson, DPM

## 2016-04-24 MED FILL — ZOLPIDEM TARTRATE 10 MG TAB: 10 | 30 days supply | Qty: 30 | Fill #1

## 2016-04-30 ENCOUNTER — Other Ambulatory Visit: Payer: Self-pay | Admitting: Family

## 2016-04-30 DIAGNOSIS — F329 Major depressive disorder, single episode, unspecified: Secondary | ICD-10-CM

## 2016-04-30 DIAGNOSIS — F32A Depression, unspecified: Secondary | ICD-10-CM

## 2016-04-30 DIAGNOSIS — G479 Sleep disorder, unspecified: Secondary | ICD-10-CM

## 2016-04-30 DIAGNOSIS — F419 Anxiety disorder, unspecified: Secondary | ICD-10-CM

## 2016-04-30 NOTE — Telephone Encounter (Signed)
Last refill was 01/22/16 

## 2016-05-01 MED ORDER — CLONAZEPAM 0.5 MG PO TABS: ORAL_TABLET | ORAL | 0 refills | Status: DC

## 2016-05-01 MED FILL — clonazePAM 0.5 MG TABS: 0.5 | 30 days supply | Qty: 60 | Fill #0

## 2016-05-05 MED ORDER — HYDROCODONE-ACETAMINOPHEN 5-325 MG PO TABS: 1.0000 | ORAL_TABLET | Freq: Four times a day (QID) | ORAL | 0 refills | Status: DC | PRN

## 2016-05-05 MED FILL — HYDROCODON-APAP 5-325: 5-325 | 7 days supply | Qty: 30 | Fill #0

## 2016-05-13 ENCOUNTER — Ambulatory Visit (INDEPENDENT_AMBULATORY_CARE_PROVIDER_SITE_OTHER): Payer: 59 | Admitting: Podiatry

## 2016-05-13 ENCOUNTER — Encounter: Payer: Self-pay | Admitting: Podiatry

## 2016-05-13 DIAGNOSIS — M722 Plantar fascial fibromatosis: Secondary | ICD-10-CM

## 2016-05-13 DIAGNOSIS — M2031 Hallux varus (acquired), right foot: Secondary | ICD-10-CM

## 2016-05-13 DIAGNOSIS — Z09 Encounter for follow-up examination after completed treatment for conditions other than malignant neoplasm: Secondary | ICD-10-CM

## 2016-05-13 MED ORDER — HYDROCODONE-ACETAMINOPHEN 5-325 MG PO TABS: 1.0000 | ORAL_TABLET | Freq: Four times a day (QID) | ORAL | 0 refills | Status: DC | PRN

## 2016-05-13 MED FILL — HYDROCODON-APAP 5-325: 5-325 | 8 days supply | Qty: 30 | Fill #0

## 2016-05-13 NOTE — Patient Instructions (Signed)

## 2016-05-14 NOTE — Progress Notes (Signed)
Subjective: [name] DixonSamantha D Baisch is a 30 y.o. is seen today in office s/p Right foot reverse Austin bunionectomy preformed on 03/14/16. That she has been doing better however the last 2 nights she has had some discomfort. She points along the medial arch of the foot which is the majority of symptoms. She has continued to wear the CAM boot at work as she has been waiting for her orthotics to arise. She states that within the last 2 days she's been doing very. Denies any systemic complaints such as fevers, chills, nausea, vomiting. No calf pain, chest pain, shortness of breath.   Objective: General: No acute distress, AAOx3  DP/PT pulses palpable 2/4, CRT < 3 sec to all digits.  Protective sensation intact. Motor function intact.  Right foot: Incision is well coapted without any evidence of dehiscence and a scar has formed. There is no surrounding erythema, ascending cellulitis, fluctuance, crepitus, malodor, drainage/purulence. There is trace edema around the surgical site. There is minimal pain along the surgical site. There is no pain with MPJ range of motion. Hallux sits in rectus position. There is currently no pain along the sesamoids today or with ROM of the 1st MTPJ. There is good range of motion of the 1st MTPJ. There is mild tenderness along the medial band of the plantar fascia along the arch of the foot. Plantar fascia intact. No other areas of tenderness. Prominence the metatarsal heads plantarly with atrophy of the fat pad. Mild tenderness on medial band plantar fascia right foot. No other areas of tenderness to bilateral lower extremities.  No other open lesions or pre-ulcerative lesions.  No pain with calf compression, swelling, warmth, erythema.   Assessment and Plan:  Status post right reverse Austin bunionectomy, doing well with no complications; plantar fasciitis   -Treatment options discussed including all alternatives, risks, and complications -Orthotics were dispensed today. Oral  and written instructions were discussed. She has started transition to regular shoe as tolerated. -She declined physical therapy. -Refilled Vicodin today. -Follow up in 4 weeks or sooner if needed. Call any questions or concerns or meantime.  *x-rays next appointment   Ovid CurdMatthew Docia Klar, DPM

## 2016-05-20 MED ORDER — HYDROCODONE-ACETAMINOPHEN 5-325 MG PO TABS: 1.0000 | ORAL_TABLET | Freq: Four times a day (QID) | ORAL | 0 refills | Status: DC | PRN

## 2016-05-20 MED FILL — HYDROCODON-APAP 5-325: 5-325 | 7 days supply | Qty: 30 | Fill #0

## 2016-05-22 ENCOUNTER — Other Ambulatory Visit: Payer: Self-pay | Admitting: Family

## 2016-05-22 DIAGNOSIS — G479 Sleep disorder, unspecified: Secondary | ICD-10-CM

## 2016-05-22 MED FILL — PANTOPRAZOLE SOD DR 40 MG T: 40 | 30 days supply | Qty: 60 | Fill #0

## 2016-05-22 NOTE — Telephone Encounter (Signed)
Last refill was 03/26/16 

## 2016-05-23 MED FILL — ZOLPIDEM TARTRATE 10 MG TAB: 10 | 30 days supply | Qty: 30 | Fill #0

## 2016-05-23 NOTE — Telephone Encounter (Signed)
Faxed

## 2016-05-27 ENCOUNTER — Ambulatory Visit (INDEPENDENT_AMBULATORY_CARE_PROVIDER_SITE_OTHER): Payer: 59 | Admitting: Podiatry

## 2016-05-27 ENCOUNTER — Encounter: Payer: Self-pay | Admitting: Podiatry

## 2016-05-27 DIAGNOSIS — M2031 Hallux varus (acquired), right foot: Secondary | ICD-10-CM

## 2016-05-27 DIAGNOSIS — Z09 Encounter for follow-up examination after completed treatment for conditions other than malignant neoplasm: Secondary | ICD-10-CM

## 2016-05-27 MED ORDER — HYDROCODONE-ACETAMINOPHEN 5-325 MG PO TABS: 1.0000 | ORAL_TABLET | Freq: Four times a day (QID) | ORAL | 0 refills | Status: DC | PRN

## 2016-05-27 MED FILL — HYDROCODON-APAP 5-325: 5-325 | 5 days supply | Qty: 20 | Fill #0

## 2016-05-27 NOTE — Progress Notes (Signed)
Subjective: [name] DixonSamantha D Lindseth is a 30 y.o. is seen today in office s/p Right foot reverse Austin bunionectomy preformed on 03/14/16. She presents today stating that her orthoics are not cushioned enough and she would like more padding. She also noticed that her big toe joint did pop last night. Denies any recent injury. Asking for a refill of her pain medication while she is here as well. She takes this mostly at night and does not take it with working. Denies any systemic complaints such as fevers, chills, nausea, vomiting. No calf pain, chest pain, shortness of breath.   Objective: General: No acute distress, AAOx3  DP/PT pulses palpable 2/4, CRT < 3 sec to all digits.  Protective sensation intact. Motor function intact.  Right foot: Incision is well coapted without any evidence of dehiscence and a scar has formed. Toe is in rectus position. There is prominence the metatarsal heads plantarly. There is good range of motion of the first MTPJ with any restrictions. Subjectively she gets the majority tenderness the sesamoid area so metatarsal 1. No other areas of tenderness. No pain on the first metatarsal cuneiform joint. No other areas of tenderness to bilateral lower extremities.  No other open lesions or pre-ulcerative lesions.  No pain with calf compression, swelling, warmth, erythema.   Assessment and Plan:  Status post right reverse Austin bunionectomy, doing well with no complications  -Treatment options discussed including all alternatives, risks, and complications -A second pair of orthotics were dispensed to her today. I added a metatarsal pad to help offload the symptomatically area. I sent her other orthotics back for cushioning. -Continue supportive shoe gear. -Refilled Vicodin. -Follow-up after her inserts come back or sooner if any issues are to arise.  *x-rays next appointment   Ovid CurdMatthew Wagoner, DPM

## 2016-06-03 MED ORDER — HYDROCODONE-ACETAMINOPHEN 5-325 MG PO TABS: 1.0000 | ORAL_TABLET | Freq: Four times a day (QID) | ORAL | 0 refills | Status: DC | PRN

## 2016-06-03 NOTE — Telephone Encounter (Signed)
OK to refill pain medicine. Misty StanleyLisa states the orthotics were sent back but they have not come in yet.

## 2016-06-17 ENCOUNTER — Other Ambulatory Visit: Payer: Self-pay | Admitting: *Deleted

## 2016-06-17 MED ORDER — HYDROCODONE-ACETAMINOPHEN 5-325 MG PO TABS: 1.0000 | ORAL_TABLET | Freq: Four times a day (QID) | ORAL | 0 refills | Status: DC | PRN

## 2016-06-17 MED FILL — HYDROCODON-APAP 5-325: 5-325 | 5 days supply | Qty: 20 | Fill #0

## 2016-06-20 MED FILL — PANTOPRAZOLE SOD DR 40 MG T: 40 | 30 days supply | Qty: 60 | Fill #1

## 2016-06-20 MED FILL — ZOLPIDEM TARTRATE 10 MG TAB: 10 | 30 days supply | Qty: 30 | Fill #1

## 2016-06-24 ENCOUNTER — Other Ambulatory Visit: Payer: Self-pay | Admitting: Podiatry

## 2016-06-24 DIAGNOSIS — M79673 Pain in unspecified foot: Secondary | ICD-10-CM

## 2016-07-07 ENCOUNTER — Other Ambulatory Visit: Payer: Self-pay | Admitting: Family

## 2016-07-07 DIAGNOSIS — F419 Anxiety disorder, unspecified: Secondary | ICD-10-CM

## 2016-07-07 DIAGNOSIS — F32A Depression, unspecified: Secondary | ICD-10-CM

## 2016-07-07 DIAGNOSIS — G479 Sleep disorder, unspecified: Secondary | ICD-10-CM

## 2016-07-07 DIAGNOSIS — F329 Major depressive disorder, single episode, unspecified: Secondary | ICD-10-CM

## 2016-07-07 NOTE — Telephone Encounter (Signed)
Last refill 05/01/16 # 60 with 0 refills.

## 2016-07-08 MED FILL — clonazePAM 0.5 MG TABS: 0.5 | 20 days supply | Qty: 60 | Fill #0

## 2016-07-08 NOTE — Telephone Encounter (Signed)
Faxed to pharmacy

## 2016-07-16 ENCOUNTER — Encounter: Payer: Self-pay | Admitting: Family

## 2016-07-16 ENCOUNTER — Ambulatory Visit (INDEPENDENT_AMBULATORY_CARE_PROVIDER_SITE_OTHER): Payer: 59 | Admitting: Family

## 2016-07-16 VITALS — BP 114/86 | HR 84 | Temp 98.5°F | Resp 16 | Ht 64.0 in | Wt 217.0 lb

## 2016-07-16 DIAGNOSIS — G479 Sleep disorder, unspecified: Secondary | ICD-10-CM | POA: Diagnosis not present

## 2016-07-16 DIAGNOSIS — M2141 Flat foot [pes planus] (acquired), right foot: Secondary | ICD-10-CM

## 2016-07-16 DIAGNOSIS — M2142 Flat foot [pes planus] (acquired), left foot: Secondary | ICD-10-CM

## 2016-07-16 DIAGNOSIS — F419 Anxiety disorder, unspecified: Principal | ICD-10-CM

## 2016-07-16 DIAGNOSIS — M2140 Flat foot [pes planus] (acquired), unspecified foot: Secondary | ICD-10-CM | POA: Insufficient documentation

## 2016-07-16 DIAGNOSIS — Z23 Encounter for immunization: Secondary | ICD-10-CM

## 2016-07-16 DIAGNOSIS — F418 Other specified anxiety disorders: Secondary | ICD-10-CM

## 2016-07-16 DIAGNOSIS — F32A Depression, unspecified: Secondary | ICD-10-CM

## 2016-07-16 DIAGNOSIS — F329 Major depressive disorder, single episode, unspecified: Secondary | ICD-10-CM

## 2016-07-16 MED ORDER — SUVOREXANT 10 MG PO TABS: 10.0000 mg | ORAL_TABLET | Freq: Every evening | ORAL | 0 refills | Status: DC | PRN

## 2016-07-16 MED ORDER — DULOXETINE HCL 30 MG PO CPEP: 90.0000 mg | ORAL_CAPSULE | Freq: Every day | ORAL | 0 refills | Status: DC

## 2016-07-16 MED ORDER — TRAMADOL HCL 50 MG PO TABS: 50.0000 mg | ORAL_TABLET | Freq: Three times a day (TID) | ORAL | 0 refills | Status: DC | PRN

## 2016-07-16 MED FILL — BELSOMRA 10 MG TABLET: 10 | 10 days supply | Qty: 10 | Fill #0

## 2016-07-16 MED FILL — traMADol HCL 50 MG TABS: 50 | 10 days supply | Qty: 30 | Fill #0

## 2016-07-16 MED FILL — DULoxetine HCL 30 MG CPEP: 30 | 90 days supply | Qty: 270 | Fill #0

## 2016-07-16 NOTE — Patient Instructions (Signed)
Thank you for choosing [facility].  SUMMARY AND INSTRUCTIONS:   Continue to follow up with podiatry for foot orthotic adjustments.   Ice after activity.   Medication:  STOP taking Ambien.  START taking Belsomra.  Tramadol as needed for pain.   Your prescription(s) have been submitted to your pharmacy or been printed and provided for you. Please take as directed and contact our office if you believe you are having problem(s) with the medication(s) or have any questions.  Labs:  Please stop by the lab on the lower level of the building for your blood work. Your results will be released to MyChart (or called to you) after review, usually within 72 hours after test completion. If any changes need to be made, you will be notified at that same time.  1.) The lab is open from 7:30am to 5:30 pm Monday-Friday 2.) No appointment is necessary 3.) Fasting (if needed) is 6-8 hours after food and drink; black coffee and water are okay   Imaging / Radiology:  Please stop by radiology on the basement level of the building for your x-rays. Your results will be released to MyChart (or called to you) after review, usually within 72 hours after test completion. If any treatments or changes are necessary, you will be notified at that same time.  Referrals:  Referrals have been made during this visit. You should expect to hear back from our schedulers in about 7-10 days in regards to establishing an appointment with the specialists we discussed.   Follow up:  If your symptoms worsen or fail to improve, please contact our office for further instruction, or in case of emergency go directly to the emergency room at the closest medical facility.

## 2016-07-16 NOTE — Progress Notes (Signed)
Subjective:    Patient ID: [name] DixonSamantha D Hurston, female    DOB: 1985-08-07, 31 y.o.   MRN: [id number]  Chief Complaint  Patient presents with  . Medication Refill    refill on effexor and wants to talk about getting on something for sleep    HPI:  [name] [name] is a 31 y.o. female who  has a past medical history of Anemia; Anxiety; DEPRESSION; GERD (gastroesophageal reflux disease); Headache(784.0); HYPOTHYROIDISM; Infertility, female (hx ); Morbid obesity (HCC); OBESITY; PCOS (polycystic ovarian syndrome); and Pregnancy induced hypertension. and presents today for a follow up office visit.   1.) Sleep disturbance - Currently maintained on Ambien. Reports taking the medication as prescribed and denies adverse side effects. Sleeping about 4 hours per night on average with the medication. Able to fall asleep with the medication but does not stay asleep. Would like to try   2.) Depression and anxiety - Currently maintained on Cymbalta and clonazepam. Reports taking the medication as prescribed and denies adverse side effects. Symptoms of anxiety and depression are very well controlled with the current medication. Clonazepam currently used 2x most days of the week depending upon stress levels. Denies suicidal ideations.   3.) Right foot pain - Status post right foot reverse Austin bunionectomy performed on 03/14/16 with continued pain located located along medial aspect of the right foot. Working with podiatry and has new orthotics to help with her symptoms. Not currently on any pain medications. Pain is described as sharp, dull and achy. Severity has caused her to have to reduce her work. Was previously prescribed Vicodin which did help with her symptoms. Continues to work to adjust her orthotics for her foot.    No Known Allergies    Outpatient Medications Prior to Visit  Medication Sig Dispense Refill  . Acetaminophen (TYLENOL PO) Take 500 mg by mouth every 6 (six) hours as needed  (pain).     . Calcium Citrate-Vitamin D (CALCIUM CITRATE + D PO) Take 1,200 mg by mouth 2 (two) times daily.     . clonazePAM (KLONOPIN) 0.5 MG tablet TAKE 1/2 TO 1 TABLET BY MOUTH DAILY AS NEEDED FOR ANXIETY AND 1 TO 2 TABLETS NIGHTLY AS NEEDED FOR SLEEP 60 tablet 0  . ferrous sulfate 325 (65 FE) MG tablet Take 325 mg by mouth daily with breakfast.    . Multiple Vitamin (MULTI-VITAMIN DAILY PO) Take 1 tablet by mouth 2 (two) times daily.     . ondansetron (ZOFRAN ODT) 4 MG disintegrating tablet Take 1 tablet (4 mg total) by mouth every 8 (eight) hours as needed for nausea. 10 tablet 0  . oxyCODONE-acetaminophen (PERCOCET/ROXICET) 5-325 MG tablet Take 1 tablet by mouth every 4 (four) hours as needed for severe pain. 30 tablet 0  . pantoprazole (PROTONIX) 40 MG tablet Take 40 mg by mouth daily.    . polyethylene glycol (MIRALAX / GLYCOLAX) packet Take 17 g by mouth daily as needed.    . promethazine (PHENERGAN) 25 MG tablet Take 25 mg by mouth every 8 (eight) hours as needed for nausea or vomiting.    . vitamin B-12 (CYANOCOBALAMIN) 1000 MCG tablet Take 5,000 mcg by mouth daily.    . vitamin C (ASCORBIC ACID) 500 MG tablet Take 500 mg by mouth daily.    . DULoxetine (CYMBALTA) 30 MG capsule Take 3 capsules (90 mg total) by mouth daily. 270 capsule 0  . traMADol (ULTRAM) 50 MG tablet Take 1 tablet (50 mg total) by mouth every 8 (  eight) hours as needed. 30 tablet 0  . traZODone (DESYREL) 50 MG tablet TAKE 1/2-1 TABLET BY MOUTH AT BEDTIME AS NEEDED FOR SLEEP. 30 tablet 5  . zolpidem (AMBIEN) 10 MG tablet TAKE 1 TABLET BY MOUTH AT BEDTIME AS NEEDED FOR SLEEP 30 tablet 1  . clindamycin (CLEOCIN) 150 MG capsule Take 150 mg by mouth 3 (three) times daily.    . predniSONE (DELTASONE) 20 MG tablet Take 1 tablet (20 mg total) by mouth 2 (two) times daily with a meal. 10 tablet 0   No facility-administered medications prior to visit.       Past Surgical History:  Procedure Laterality Date  . CESAREAN  SECTION     x2  . 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   Procedure: LAPAROSCOPIC ROUX-EN-Y GASTRIC;  Surgeon: Atilano Ina, MD;  Location: [facility];  Service: General;  Laterality: N/A;  laparoscopic roux-en-y gastric bypass  . LITHOTRIPSY Left   . TONSILLECTOMY    . TUBAL LIGATION    . UPPER GI ENDOSCOPY  08/24/2012   Procedure: UPPER GI ENDOSCOPY;  Surgeon: Atilano Ina, MD;  Location: [facility];  Service: General;;  . WISDOM TOOTH EXTRACTION        Past Medical History:  Diagnosis Date  . Anemia    after 1st delivery  . Anxiety   . DEPRESSION   . GERD (gastroesophageal reflux disease)    pregnancy related- no meds  . Headache(784.0)    hx of migraines  . HYPOTHYROIDISM    hx of, normalized TSH during pregnanacy 2011  . Infertility, female hx   . Morbid obesity (HCC)    s/p RY 08/2012 - start weight 290#  . OBESITY   . PCOS (polycystic ovarian syndrome)   . Pregnancy induced hypertension    gestational      Review of Systems  Constitutional: Negative for chills and fever.  Respiratory: Negative for chest tightness and shortness of breath.   Musculoskeletal:       Positive for right foot pain  Neurological: Negative for weakness and numbness.  Psychiatric/Behavioral: Positive for sleep disturbance. Negative for agitation, behavioral problems and suicidal ideas.      Objective:    BP 114/86 (BP Location: Left Arm, Patient Position: Sitting, Cuff Size: Large)   Pulse 84   Temp 98.5 F (36.9 C) (Oral)   Resp 16   Ht 5\' 4"  (1.626 m)   Wt 217 lb (98.4 kg)   SpO2 98%   BMI 37.25 kg/m  Nursing note and vital signs reviewed.  Physical Exam  Constitutional: She is oriented to person, place, and time. She appears well-developed and well-nourished. No distress.  Cardiovascular: Normal rate, regular rhythm, normal heart sounds and intact distal pulses.   Pulmonary/Chest: Effort normal and breath sounds normal.  Musculoskeletal:  Right foot - s/p Bunion surgery with  well-healed surgical scar noted across dorsum of foot. Patient which structure consistent with pes planus and collapsed transverse arch. Thre remains tenderness along the first metatarsal and medial longitudinal arch. Range of motion is within normal limits. Strength is normal.   Neurological: She is alert and oriented to person, place, and time.  Skin: Skin is warm and dry.  Psychiatric: She has a normal mood and affect. Her behavior is normal. Judgment and thought content normal.       Assessment & Plan:   Problem List Items Addressed This Visit      Other   Anxiety and depression - Primary  Anxiety and depression appear adequate controlled with current medication regimen and no adverse side effects. Continue current dosage of Cymbalta and clonazepam. No suicidal ideations. Continue to monitor.      Relevant Medications   DULoxetine (CYMBALTA) 30 MG capsule   Sleep disturbance    Continues to experience decreased sleep with current dosage of Ambien. Discontinue Ambien. Start Belsomra. Encouraged to continue good sleep hygiene. Follow-up pending trial of medication.      Pes planus    Chronic has planus and collapsed transverse arch status post bunion removal surgery by podiatry. Recommend continued conservative treatment with ice and cam walker as needed for support. Explained to patient this may take several months to complete healing. Continue to work with podiatry for orthotic adjustment as needed. Start tramadol as needed for pain. Follow-up if symptoms worsen or do not improve.       Other Visit Diagnoses    Need for diphtheria-tetanus-pertussis (Tdap) vaccine, adult/adolescent       Relevant Orders   Tdap vaccine greater than or equal to 7yo IM (Completed)       I have discontinued Ms. Boomer's traZODone, predniSONE, clindamycin, and zolpidem. I am also having her start on Suvorexant. Additionally, I am having her maintain her Multiple Vitamin (MULTI-VITAMIN DAILY PO),  Acetaminophen (TYLENOL PO), Calcium Citrate-Vitamin D (CALCIUM CITRATE + D PO), ondansetron, vitamin C, pantoprazole, polyethylene glycol, vitamin B-12, ferrous sulfate, promethazine, oxyCODONE-acetaminophen, clonazePAM, traMADol, and DULoxetine.   Meds ordered this encounter  Medications  . Suvorexant (BELSOMRA) 10 MG TABS    Sig: Take 10 mg by mouth at bedtime as needed.    Dispense:  10 tablet    Refill:  0    Order Specific Question:   Supervising Provider    Answer:   Hillard Danker A [[id number]]  . traMADol (ULTRAM) 50 MG tablet    Sig: Take 1 tablet (50 mg total) by mouth every 8 (eight) hours as needed.    Dispense:  30 tablet    Refill:  0    Order Specific Question:   Supervising Provider    Answer:   Hillard Danker A [[id number]]  . DULoxetine (CYMBALTA) 30 MG capsule    Sig: Take 3 capsules (90 mg total) by mouth daily.    Dispense:  270 capsule    Refill:  0    Order Specific Question:   Supervising Provider    Answer:   Hillard Danker A [[id number]]     Follow-up: Return in about 1 month (around 08/13/2016), or if symptoms worsen or fail to improve.  Jeanine Luz, FNP

## 2016-07-16 NOTE — Assessment & Plan Note (Signed)
Continues to experience decreased sleep with current dosage of Ambien. Discontinue Ambien. Start Belsomra. Encouraged to continue good sleep hygiene. Follow-up pending trial of medication.

## 2016-07-16 NOTE — Assessment & Plan Note (Addendum)
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 Recommend continued conservative treatment with ice and cam walker as needed for support. Explained to patient this may take several months to complete healing. Continue to work with podiatry for orthotic adjustment as needed. Start tramadol as needed for pain. Follow-up if symptoms worsen or do not improve.

## 2016-07-16 NOTE — Assessment & Plan Note (Signed)
Anxiety and depression appear adequate controlled with current medication regimen and no adverse side effects. Continue current dosage of Cymbalta and clonazepam. No suicidal ideations. Continue to monitor.

## 2016-07-18 ENCOUNTER — Telehealth: Payer: Self-pay | Admitting: *Deleted

## 2016-07-18 ENCOUNTER — Encounter: Payer: Self-pay | Admitting: Family

## 2016-07-18 ENCOUNTER — Ambulatory Visit (INDEPENDENT_AMBULATORY_CARE_PROVIDER_SITE_OTHER): Payer: 59

## 2016-07-18 ENCOUNTER — Encounter: Payer: Self-pay | Admitting: Podiatry

## 2016-07-18 ENCOUNTER — Ambulatory Visit (INDEPENDENT_AMBULATORY_CARE_PROVIDER_SITE_OTHER): Payer: 59 | Admitting: Podiatry

## 2016-07-18 DIAGNOSIS — M2031 Hallux varus (acquired), right foot: Secondary | ICD-10-CM

## 2016-07-18 DIAGNOSIS — L600 Ingrowing nail: Secondary | ICD-10-CM | POA: Diagnosis not present

## 2016-07-18 DIAGNOSIS — M2010 Hallux valgus (acquired), unspecified foot: Secondary | ICD-10-CM

## 2016-07-18 NOTE — Progress Notes (Signed)
Subjective: 31 year old female presents the office today for popping valuation of right foot status post Lapidus bunionectomy with reverse Austin osteotomy to hallux varus. She states that she is doing better from the surgical site. She does have orthotics and she feels that she needs more support under the ball of her foot. Other than that she is doing well to the surgical foot. She is also sending ingrown tenderness to left big toe. She did have this removed previously but she has noticed a small amount of ingrowing pain to the nail towards the tip of the nail. Denies any systemic complaints such as fevers, chills, nausea, vomiting. No acute changes since last appointment, and no other complaints at this time.   Objective: AAO x3, NAD DP/PT pulses palpable bilaterally, CRT less than 3 seconds Incision is well-healed prior surgery. Hallux is in rectus position. There is no area of tenderness in the surgical site. There is adequate range of motion the first MTPJ. This prominence the metatarsal heads plantarly with atrophy of the fat pad. There is no overlying edema, erythema, increase in warmth. On the left foot along the medial nail border is a small amount of incurvation on the distal portion of the nail. There is local edema without any erythema, ascending cellulitis, drainage or pus. No open lesions or pre-ulcerative lesions.  No pain with calf compression, swelling, warmth, erythema  Assessment: Right foot doing well, left hallux ingrown toenail  Plan: -All treatment options discussed with the patient including all alternatives, risks, complications.  -Right foot is doing well. X-rays were obtained. He appears to be increased consolidation and healing along the surgical sites. No evidence of acute fracture. -I will send her orthotics back for further modifications at a metatarsal pad. -I debris left hallux on through the symptomatic portion ingrown toenail. Epsom salts soaks and Neosporin and  a bandage daily. Symptoms continue will need to have a revisional partial nail avulsion. -Patient encouraged to call the office with any questions, concerns, change in symptoms.   Ovid CurdMatthew Judithe Keetch, DPM

## 2016-07-18 NOTE — Telephone Encounter (Signed)
Pt left msg on triage stating she does not like the side effect from the Belsomra, and med is really not working for her. She would like Tammy SoursGreg ro rx low dose ambien as [facility] at [employer]...Raechel Chute/lmb

## 2016-07-20 MED ORDER — ZOLPIDEM TARTRATE ER 6.25 MG PO TBCR: 6.2500 mg | EXTENDED_RELEASE_TABLET | Freq: Every evening | ORAL | 0 refills | Status: DC | PRN

## 2016-07-20 NOTE — Telephone Encounter (Signed)
Addressed.

## 2016-07-21 ENCOUNTER — Telehealth: Payer: Self-pay | Admitting: *Deleted

## 2016-07-21 NOTE — Telephone Encounter (Signed)
Sent inserts back to [facility] and added a medium metatarsal to the right insert. Misty StanleyLisa

## 2016-08-02 ENCOUNTER — Encounter: Payer: Self-pay | Admitting: Emergency Medicine

## 2016-08-02 ENCOUNTER — Ambulatory Visit
Admission: EM | Admit: 2016-08-02 | Discharge: 2016-08-02 | Disposition: A | Discharge status: Home / Self Care | Payer: 59 | Attending: Family Medicine | Admitting: Family Medicine

## 2016-08-02 DIAGNOSIS — J0100 Acute maxillary sinusitis, unspecified: Secondary | ICD-10-CM | POA: Diagnosis not present

## 2016-08-02 DIAGNOSIS — R05 Cough: Secondary | ICD-10-CM | POA: Diagnosis not present

## 2016-08-02 DIAGNOSIS — R059 Cough, unspecified: Secondary | ICD-10-CM

## 2016-08-02 MED ORDER — AMOXICILLIN 875 MG PO TABS: 875.0000 mg | ORAL_TABLET | Freq: Two times a day (BID) | ORAL | 0 refills | Status: DC

## 2016-08-02 MED ORDER — HYDROCOD POLST-CPM POLST ER 10-8 MG/5ML PO SUER: 5.0000 mL | Freq: Two times a day (BID) | ORAL | 0 refills | Status: DC | PRN

## 2016-08-02 NOTE — ED Provider Notes (Signed)
[facility]    CSN: [id number] Arrival date & time: 08/02/16  8115     History   Chief Complaint Chief Complaint  Patient presents with  . Cough    HPI [name] [name] is a 31 y.o. female.   The history is provided by the patient.  Cough  Associated symptoms: headaches   Associated symptoms: no ear pain, no fever and no wheezing   URI  Presenting symptoms: congestion, cough and facial pain   Presenting symptoms: no ear pain and no fever   Severity:  Moderate Onset quality:  Sudden Duration:  6 days Timing:  Constant Progression:  Worsening Chronicity:  New Relieved by:  Nothing Ineffective treatments:  OTC medications Associated symptoms: headaches   Associated symptoms: no wheezing   Risk factors: sick contacts   Risk factors: not elderly, no chronic cardiac disease, no chronic kidney disease, no chronic respiratory disease, no diabetes mellitus, no immunosuppression, no recent illness and no recent travel     Past Medical History:  Diagnosis Date  . Anemia    after 1st delivery  . Anxiety   . DEPRESSION   . GERD (gastroesophageal reflux disease)    pregnancy related- no meds  . Headache(784.0)    hx of migraines  . HYPOTHYROIDISM    hx of, normalized TSH during pregnanacy 2011  . Infertility, female hx   . Morbid obesity ([place])    s/p RY 08/2012 - start weight 290#  . OBESITY   . PCOS (polycystic ovarian syndrome)   . Pregnancy induced hypertension    gestational    Patient Active Problem List   Diagnosis Date Noted  . Pes planus 07/16/2016  . Hallux varus, acquired, right 03/18/2016  . Routine general medical examination at a health care facility 08/13/2015  . Decreased attention Span 05/28/2015  . Sleep disturbance 05/28/2015  . Iron deficiency anemia 02/22/2015  . Kidney stone 08/24/2013  . Obesity (BMI 30-39.9) 04/07/2013  . 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 Sinusitis, acute 07/09/2010  . Anxiety and  depression 04/10/2010  . Elevated blood pressure 04/05/2010    Past Surgical History:  Procedure Laterality Date  . CESAREAN SECTION     x2  . 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   Procedure: LAPAROSCOPIC ROUX-EN-Y GASTRIC;  Surgeon: Gayland Curry, MD;  Location: [facility];  Service: General;  Laterality: N/A;  laparoscopic roux-en-y gastric bypass  . LITHOTRIPSY Left   . TONSILLECTOMY    . TUBAL LIGATION    . UPPER GI ENDOSCOPY  08/24/2012   Procedure: UPPER GI ENDOSCOPY;  Surgeon: Gayland Curry, MD;  Location: [facility];  Service: General;;  . WISDOM TOOTH EXTRACTION      OB History    Gravida Para Term Preterm AB Living   2 2 2  0 0 2   SAB TAB Ectopic Multiple Live Births   0 0 0 0 2       Home Medications    Prior to Admission medications   Medication Sig Start Date End Date Taking? Authorizing Provider  Acetaminophen (TYLENOL PO) Take 500 mg by mouth every 6 (six) hours as needed (pain).     Historical Provider, MD  amoxicillin (AMOXIL) 875 MG tablet Take 1 tablet (875 mg total) by mouth 2 (two) times daily. 08/02/16   Norval Gable, MD  Calcium Citrate-Vitamin D (CALCIUM CITRATE + D PO) Take 1,200 mg by mouth 2 (two) times daily.     Historical Provider, MD  chlorpheniramine-HYDROcodone (  TUSSIONEX PENNKINETIC ER) 10-8 MG/5ML SUER Take 5 mLs by mouth every 12 (twelve) hours as needed. 08/02/16   Norval Gable, MD  clonazePAM (KLONOPIN) 0.5 MG tablet TAKE 1/2 TO 1 TABLET BY MOUTH DAILY AS NEEDED FOR ANXIETY AND 1 TO 2 TABLETS NIGHTLY AS NEEDED FOR SLEEP 07/07/16   Golden Circle, FNP  DULoxetine (CYMBALTA) 30 MG capsule Take 3 capsules (90 mg total) by mouth daily. 07/16/16   Golden Circle, FNP  ferrous sulfate 325 (65 FE) MG tablet Take 325 mg by mouth daily with breakfast.    Historical Provider, MD  Multiple Vitamin (MULTI-VITAMIN DAILY PO) Take 1 tablet by mouth 2 (two) times daily.     Historical Provider, MD  pantoprazole (PROTONIX) 40 MG tablet Take 40 mg by mouth daily.     Historical Provider, MD  polyethylene glycol (MIRALAX / GLYCOLAX) packet Take 17 g by mouth daily as needed.    Historical Provider, MD  traMADol (ULTRAM) 50 MG tablet Take 1 tablet (50 mg total) by mouth every 8 (eight) hours as needed. 07/16/16   Golden Circle, FNP  vitamin B-12 (CYANOCOBALAMIN) 1000 MCG tablet Take 5,000 mcg by mouth daily.    Historical Provider, MD  vitamin C (ASCORBIC ACID) 500 MG tablet Take 500 mg by mouth daily.    Historical Provider, MD  zolpidem (AMBIEN CR) 6.25 MG CR tablet Take 1 tablet (6.25 mg total) by mouth at bedtime as needed for sleep. 07/20/16   Golden Circle, FNP    Family History Family History  Problem Relation Age of Onset  . Hyperlipidemia Father   . Hypertension Father   . Kidney disease Father   . Colon cancer Father 35    colon, met to liver  . Hypertension Mother   . Hypertension Maternal Grandmother   . Uterine cancer Maternal Grandmother 76  . Diabetes Maternal Grandfather     Social History Social History  Substance Use Topics  . Smoking status: Never Smoker  . Smokeless tobacco: Never Used  . Alcohol use No     Comment: rare/socially     Allergies   Patient has no known allergies.   Review of Systems Review of Systems  Constitutional: Negative for fever.  HENT: Positive for congestion. Negative for ear pain.   Respiratory: Positive for cough. Negative for wheezing.   Neurological: Positive for headaches.     Physical Exam Triage Vital Signs ED Triage Vitals  Enc Vitals Group     BP 08/02/16 0939 136/81     Pulse Rate 08/02/16 0939 94     Resp 08/02/16 0939 16     Temp 08/02/16 0939 98.2 F (36.8 C)     Temp Source 08/02/16 0939 Oral     SpO2 08/02/16 0939 100 %     Weight 08/02/16 0936 213 lb (96.6 kg)     Height 08/02/16 0936 5' 4"  (1.626 m)     Head Circumference --      Peak Flow --      Pain Score 08/02/16 0938 6     Pain Loc --      Pain Edu? --      Excl. in [place]? --    No data  found.   Updated Vital Signs BP 136/81 (BP Location: Left Arm)   Pulse 94   Temp 98.2 F (36.8 C) (Oral)   Resp 16   Ht 5' 4"  (1.626 m)   Wt 213 lb (96.6 kg)   SpO2  100%   BMI 36.56 kg/m   Visual Acuity Right Eye Distance:   Left Eye Distance:   Bilateral Distance:    Right Eye Near:   Left Eye Near:    Bilateral Near:     Physical Exam  Constitutional: She appears well-developed and well-nourished. No distress.  HENT:  Head: Normocephalic and atraumatic.  Right Ear: Tympanic membrane, external ear and ear canal normal.  Left Ear: Tympanic membrane, external ear and ear canal normal.  Nose: Mucosal edema and rhinorrhea present. No nose lacerations, sinus tenderness, nasal deformity, septal deviation or nasal septal hematoma. No epistaxis.  No foreign bodies. Right sinus exhibits maxillary sinus tenderness and frontal sinus tenderness. Left sinus exhibits maxillary sinus tenderness and frontal sinus tenderness.  Mouth/Throat: Uvula is midline, oropharynx is clear and moist and mucous membranes are normal. No oropharyngeal exudate.  Eyes: Conjunctivae and EOM are normal. Pupils are equal, round, and reactive to light. Right eye exhibits no discharge. Left eye exhibits no discharge. No scleral icterus.  Neck: Normal range of motion. Neck supple. No thyromegaly present.  Cardiovascular: Normal rate, regular rhythm and normal heart sounds.   Pulmonary/Chest: Effort normal and breath sounds normal. No respiratory distress. She has no wheezes. She has no rales.  Lymphadenopathy:    She has no cervical adenopathy.  Skin: She is not diaphoretic.  Nursing note and vitals reviewed.    UC Treatments / Results  Labs (all labs ordered are listed, but only abnormal results are displayed) Labs Reviewed - No data to display  EKG  EKG Interpretation None       Radiology No results found.  Procedures Procedures (including critical care time)  Medications Ordered in  UC Medications - No data to display   Initial Impression / Assessment and Plan / UC Course  I have reviewed the triage vital signs and the nursing notes.  Pertinent labs & imaging results that were available during my care of the patient were reviewed by me and considered in my medical decision making (see chart for details).       Final Clinical Impressions(s) / UC Diagnoses   Final diagnoses:  Acute maxillary sinusitis, recurrence not specified  Cough    New Prescriptions Discharge Medication List as of 08/02/2016  9:48 AM    START taking these medications   Details  amoxicillin (AMOXIL) 875 MG tablet Take 1 tablet (875 mg total) by mouth 2 (two) times daily., Starting Sat 08/02/2016, Normal    chlorpheniramine-HYDROcodone (TUSSIONEX PENNKINETIC ER) 10-8 MG/5ML SUER Take 5 mLs by mouth every 12 (twelve) hours as needed., Starting Sat 08/02/2016, Normal       1. diagnosis reviewed with patient 2. rx as per orders above; reviewed possible side effects, interactions, risks and benefits  3. Recommend supportive treatment with increased fluids  4. Follow-up prn if symptoms worsen or don't improve   Norval Gable, MD 08/02/16 [phone]

## 2016-08-02 NOTE — ED Triage Notes (Signed)
Patient c/o cough, chest congestion, runny nose, and HAs, bodyaches since Monday.  Patient denies fevers.

## 2016-08-04 ENCOUNTER — Telehealth: Payer: 59 | Admitting: Nurse Practitioner

## 2016-08-04 ENCOUNTER — Encounter: Payer: Self-pay | Admitting: Nurse Practitioner

## 2016-08-04 DIAGNOSIS — R6889 Other general symptoms and signs: Secondary | ICD-10-CM | POA: Diagnosis not present

## 2016-08-04 MED ORDER — OSELTAMIVIR PHOSPHATE 75 MG PO CAPS: 75.0000 mg | ORAL_CAPSULE | Freq: Two times a day (BID) | ORAL | 0 refills | Status: AC

## 2016-08-04 NOTE — Progress Notes (Signed)

## 2016-08-05 ENCOUNTER — Telehealth: Payer: Self-pay | Admitting: *Deleted

## 2016-08-05 NOTE — Telephone Encounter (Signed)
Called and the patient's voice mail was full and wanted to leave a message stating that the inserts are in the [place] office if the patient would like to come and pick them up. [name] [name]

## 2016-08-13 ENCOUNTER — Encounter: Payer: Self-pay | Admitting: Family

## 2016-08-13 ENCOUNTER — Other Ambulatory Visit: Payer: Self-pay | Admitting: Family

## 2016-08-13 MED ORDER — ZOLPIDEM TARTRATE 10 MG PO TABS: 10.0000 mg | ORAL_TABLET | Freq: Every evening | ORAL | 2 refills | Status: DC | PRN

## 2016-08-13 MED FILL — ZOLPIDEM TARTRATE 10 MG TAB: 10 | 30 days supply | Qty: 30 | Fill #0 | Status: TO

## 2016-08-19 ENCOUNTER — Encounter: Payer: Self-pay | Admitting: Sports Medicine

## 2016-08-19 ENCOUNTER — Ambulatory Visit (INDEPENDENT_AMBULATORY_CARE_PROVIDER_SITE_OTHER): Payer: 59 | Admitting: Sports Medicine

## 2016-08-19 ENCOUNTER — Telehealth: Payer: Self-pay | Admitting: *Deleted

## 2016-08-19 DIAGNOSIS — M79672 Pain in left foot: Secondary | ICD-10-CM | POA: Diagnosis not present

## 2016-08-19 DIAGNOSIS — M779 Enthesopathy, unspecified: Secondary | ICD-10-CM

## 2016-08-19 DIAGNOSIS — M79671 Pain in right foot: Secondary | ICD-10-CM | POA: Diagnosis not present

## 2016-08-19 MED ORDER — ACETAMINOPHEN-CODEINE #3 300-30 MG PO TABS: 1.0000 | ORAL_TABLET | Freq: Three times a day (TID) | ORAL | 0 refills | Status: DC | PRN

## 2016-08-19 MED ORDER — METHYLPREDNISOLONE 4 MG PO TBPK: ORAL_TABLET | ORAL | 0 refills | Status: DC

## 2016-08-19 MED FILL — ACETAMINOPHEN/COD #3 TABLET: 300-30 | 7 days supply | Qty: 21 | Fill #0

## 2016-08-19 MED FILL — METHYLPREDNISOLONE 4 MG TAB: 4 | 6 days supply | Qty: 21 | Fill #0

## 2016-08-19 NOTE — Progress Notes (Signed)
Subjective: [name] [name] is a 31 y.o. female patient who presents to office for evaluation of right and left foot pain. Patient complains of continued pain since surgery with Dr. Ardelle Anton on right at big toe joint. States pain is worse by the end of the day R>L big toe joint. Reports that she has orthotics in her shoes with felt padding and her new set is in car and has not tried to wear them yet. Reports that nothing thus far has helped with the pain. Patient denies any other pedal complaints. Denies recent injury/trip/fall/sprain/any other causative factors.   Patient Active Problem List   Diagnosis Date Noted  . Pes planus 07/16/2016  . Hallux varus, acquired, right 03/18/2016  . Routine general medical examination at a health care facility 08/13/2015  . Decreased attention Span 05/28/2015  . Sleep disturbance 05/28/2015  . Iron deficiency anemia 02/22/2015  . Kidney stone 08/24/2013  . Obesity (BMI 30-39.9) 04/07/2013  . 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 Sinusitis, acute 07/09/2010  . Anxiety and depression 04/10/2010  . Elevated blood pressure 04/05/2010    Current Outpatient Prescriptions on File Prior to Visit  Medication Sig Dispense Refill  . Acetaminophen (TYLENOL PO) Take 500 mg by mouth every 6 (six) hours as needed (pain).     Marland Kitchen amoxicillin (AMOXIL) 875 MG tablet Take 1 tablet (875 mg total) by mouth 2 (two) times daily. 20 tablet 0  . Calcium Citrate-Vitamin D (CALCIUM CITRATE + D PO) Take 1,200 mg by mouth 2 (two) times daily.     . chlorpheniramine-HYDROcodone (TUSSIONEX PENNKINETIC ER) 10-8 MG/5ML SUER Take 5 mLs by mouth every 12 (twelve) hours as needed. 120 mL 0  . clonazePAM (KLONOPIN) 0.5 MG tablet TAKE 1/2 TO 1 TABLET BY MOUTH DAILY AS NEEDED FOR ANXIETY AND 1 TO 2 TABLETS NIGHTLY AS NEEDED FOR SLEEP 60 tablet 0  . DULoxetine (CYMBALTA) 30 MG capsule Take 3 capsules (90 mg total) by mouth daily. 270 capsule 0  . ferrous sulfate 325 (65 FE) MG  tablet Take 325 mg by mouth daily with breakfast.    . Multiple Vitamin (MULTI-VITAMIN DAILY PO) Take 1 tablet by mouth 2 (two) times daily.     . pantoprazole (PROTONIX) 40 MG tablet Take 40 mg by mouth daily.    . polyethylene glycol (MIRALAX / GLYCOLAX) packet Take 17 g by mouth daily as needed.    . traMADol (ULTRAM) 50 MG tablet Take 1 tablet (50 mg total) by mouth every 8 (eight) hours as needed. 30 tablet 0  . vitamin B-12 (CYANOCOBALAMIN) 1000 MCG tablet Take 5,000 mcg by mouth daily.    . vitamin C (ASCORBIC ACID) 500 MG tablet Take 500 mg by mouth daily.    Marland Kitchen zolpidem (AMBIEN) 10 MG tablet Take 1 tablet (10 mg total) by mouth at bedtime as needed for sleep. 30 tablet 2   No current facility-administered medications on file prior to visit.     No Known Allergies  Objective:  General: Alert and oriented x3 in no acute distress  Dermatology: Surgical scar right 1st MTPJ well healed, No open lesions bilateral lower extremities, no webspace macerations, no ecchymosis bilateral, all nails x 10 are well manicured.  Vascular: Dorsalis Pedis and Posterior Tibial pedal pulses palpable, Capillary Fill Time 3 seconds,(+) pedal hair growth bilateral, no edema bilateral lower extremities, Temperature gradient within normal limits.  Neurology: Gross sensation intact via light touch bilateral, (- )Tinels sign bilateral.   Musculoskeletal: Mild tenderness  with palpation at sesamoid complex and medial 1st MTPJ bilateral and with range of motion without crepitus, rectus hallux on right and bunion on left, No pain with calf compression bilateral. Strength within normal limits in all groups bilateral.   Gait: Antalgic gait  Assessment and Plan: Problem List Items Addressed This Visit    None    Visit Diagnoses    Capsulitis    -  Primary   Relevant Medications   acetaminophen-codeine (TYLENOL #3) 300-30 MG tablet   methylPREDNISolone (MEDROL DOSEPAK) 4 MG TBPK tablet   Foot pain, bilateral        Relevant Medications   acetaminophen-codeine (TYLENOL #3) 300-30 MG tablet   methylPREDNISolone (MEDROL DOSEPAK) 4 MG TBPK tablet       -Complete examination performed -Previous Xrays reviewed -Discussed treatement options for possible capsulitis with transfer metatarsalgia -Added felt extension on current orthotics and advised patient to slowly break in new pair of orthotics -Recommend steroid injection at 1st MTPJs for pain however patient declined at this time -Rx Medrol dose pak to take as instructed and Tylenol #3 to take a end of day for severe pain -Patient to return to office in 2 weeks to further discuss with Dr. Ardelle AntonWagoner plan of care if no better or sooner if condition worsens.  Asencion Islamitorya Houa Ackert, DPM

## 2016-08-19 NOTE — Telephone Encounter (Signed)
Called and left a message for the patient to come and get the refurbished inserts at the [facility]. Misty StanleyLisa

## 2016-08-25 ENCOUNTER — Encounter: Payer: Self-pay | Admitting: Podiatry

## 2016-08-26 ENCOUNTER — Other Ambulatory Visit: Payer: Self-pay | Admitting: Family

## 2016-08-26 DIAGNOSIS — F32A Depression, unspecified: Secondary | ICD-10-CM

## 2016-08-26 DIAGNOSIS — G479 Sleep disorder, unspecified: Secondary | ICD-10-CM

## 2016-08-26 DIAGNOSIS — F329 Major depressive disorder, single episode, unspecified: Secondary | ICD-10-CM

## 2016-08-26 DIAGNOSIS — F419 Anxiety disorder, unspecified: Secondary | ICD-10-CM

## 2016-08-26 MED ORDER — CLONAZEPAM 0.5 MG PO TABS: 0.5000 mg | ORAL_TABLET | Freq: Two times a day (BID) | ORAL | 1 refills | Status: DC | PRN

## 2016-08-27 MED FILL — clonazePAM 0.5 MG TABS: 0.5 | 30 days supply | Qty: 60 | Fill #0 | Status: TO

## 2016-09-09 ENCOUNTER — Encounter: Payer: Self-pay | Admitting: Podiatry

## 2016-09-09 ENCOUNTER — Ambulatory Visit (INDEPENDENT_AMBULATORY_CARE_PROVIDER_SITE_OTHER): Payer: 59 | Admitting: Podiatry

## 2016-09-09 ENCOUNTER — Ambulatory Visit: Payer: 59 | Admitting: Sports Medicine

## 2016-09-09 VITALS — BP 112/82 | HR 111 | Resp 18

## 2016-09-09 DIAGNOSIS — B351 Tinea unguium: Secondary | ICD-10-CM

## 2016-09-09 DIAGNOSIS — L600 Ingrowing nail: Secondary | ICD-10-CM

## 2016-09-09 DIAGNOSIS — Z969 Presence of functional implant, unspecified: Secondary | ICD-10-CM

## 2016-09-09 DIAGNOSIS — L609 Nail disorder, unspecified: Secondary | ICD-10-CM | POA: Diagnosis not present

## 2016-09-09 DIAGNOSIS — M2012 Hallux valgus (acquired), left foot: Secondary | ICD-10-CM | POA: Diagnosis not present

## 2016-09-09 MED ORDER — ACETAMINOPHEN-CODEINE #3 300-30 MG PO TABS: 1.0000 | ORAL_TABLET | Freq: Three times a day (TID) | ORAL | 0 refills | Status: DC | PRN

## 2016-09-09 MED ORDER — ACETAMINOPHEN-CODEINE #3 300-30 MG PO TABS
1.0000 | ORAL_TABLET | Freq: Three times a day (TID) | ORAL | 0 refills | Status: DC | PRN
Start: 2016-09-09 — End: 2017-03-10

## 2016-09-09 MED FILL — ACETAMINOPHEN/COD #3 TABLET: 300-30 | 5 days supply | Qty: 14 | Fill #0

## 2016-09-09 MED FILL — ZOLPIDEM TARTRATE 10 MG TAB: 10 | 30 days supply | Qty: 30 | Fill #0

## 2016-09-09 NOTE — Patient Instructions (Signed)

## 2016-09-09 NOTE — Progress Notes (Signed)
Subjective: 31 year old female presents to the office today for concerns of continued pain along an ingrown toenail to the right big toe which is been ongoing for several months. She is tried trim the area out herself which helps some area still ingrown causes intermittent pain. She denies any swelling or redness or any drainage from the toenail today.she also states that the bunion is doing much better she's having no pain to the ball of the foot however she does feel the hardware on the right foot. She also has a moderate bunion left foot does become painful however after taking steroids as this helped. Denies any systemic complaints such as fevers, chills, nausea, vomiting. No acute changes since last appointment, and no other complaints at this time.   Objective: AAO x3, NAD DP/PT pulses palpable bilaterally, CRT less than 3 seconds On the right for the scar is well-healed from prior surgery and hallux is in a rectus position. Mild tears on the medial aspect of the surgical site the Lapidus bunionectomy and hardware is palpable. There is incurvation along the medial aspect of the right hallux toenail with tenderness palpation. As well as edema there is no erythema or increase in warmth. There is no drainage or pus expressed. Moderate HAV is present left foot. There is no pain or crepitation first MPJ range of motion. There is no hypermobility present. No open lesions or pre-ulcerative lesions.  No pain with calf compression, swelling, warmth, erythema  Assessment: Right medial hallux symptomatic ingrown toenail; right prominent hardware; left moderate HAV  Plan: -All treatment options discussed with the patient including all alternatives, risks, complications.  -Discussed for hardware removals as well as bunion surgery for the left foot. She will consider this and discuss this with her jobs. -At this time, the patient is requesting partial nail removal with chemical matricectomy to the  symptomatic portion of the nail. Risks and complications were discussed with the patient for which they understand and  verbally consent to the procedure. Under sterile conditions a total of 3 mL of a mixture of 2% lidocaine plain and 0.5% Marcaine plain was infiltrated in a hallux block fashion. Once anesthetized, the skin was prepped in sterile fashion. A tourniquet was then applied. Next the medial aspect of hallux nail border was then sharply excised making sure to remove the entire offending nail border. Once the nails were ensured to be removed area was debrided and the underlying skin was intact. There is no purulence identified in the procedure. Next phenol was then applied under standard conditions and copiously irrigated. Silvadene was applied. A dry sterile dressing was applied. After application of the dressing the tourniquet was removed and there is found to be an immediate capillary refill time to the digit. The patient tolerated the procedure well any complications. Post procedure instructions were discussed the patient for which he verbally understood. Follow-up in one week for nail check or sooner if any problems are to arise. Discussed signs/symptoms of infection and directed to call the office immediately should any occur or go directly to the emergency room. In the meantime, encouraged to call the office with any questions, concerns, changes symptoms. -She will bring in her old orthotics in order to send back to do the same modifications as we did on her recent ones.   Ovid CurdMatthew Wagoner, DPM

## 2016-09-10 ENCOUNTER — Telehealth: Payer: Self-pay

## 2016-09-10 NOTE — Telephone Encounter (Signed)
Spoke with patient and scheduled her for 10/08/16 for surgery with Dr Ardelle AntonWagoner

## 2016-09-10 NOTE — Telephone Encounter (Signed)
LVM returning patient's call in regards to scheduling surgery.

## 2016-09-10 NOTE — Addendum Note (Signed)
Addended by: Hadley PenOX, Cheree Fowles R on: 09/10/2016 04:58 PM   Modules accepted: Orders

## 2016-09-15 ENCOUNTER — Telehealth: Payer: 59 | Admitting: Family

## 2016-09-15 ENCOUNTER — Ambulatory Visit: Payer: Self-pay | Admitting: Physician Assistant

## 2016-09-15 ENCOUNTER — Encounter: Payer: Self-pay | Admitting: Physician Assistant

## 2016-09-15 VITALS — BP 110/80 | HR 91 | Temp 98.6°F

## 2016-09-15 DIAGNOSIS — R6889 Other general symptoms and signs: Secondary | ICD-10-CM

## 2016-09-15 DIAGNOSIS — T7840XD Allergy, unspecified, subsequent encounter: Secondary | ICD-10-CM

## 2016-09-15 DIAGNOSIS — R21 Rash and other nonspecific skin eruption: Secondary | ICD-10-CM

## 2016-09-15 DIAGNOSIS — H578 Other specified disorders of eye and adnexa: Secondary | ICD-10-CM | POA: Diagnosis not present

## 2016-09-15 IMAGING — CR DG ABDOMEN 1V
2 series · 2 of 2 positions shown · non-contrast
Comparison: CT [DATE]

CLINICAL DATA: Left flank pain

EXAM:
ABDOMEN - 1 VIEW

[t abdomen supine (1 of 2)]
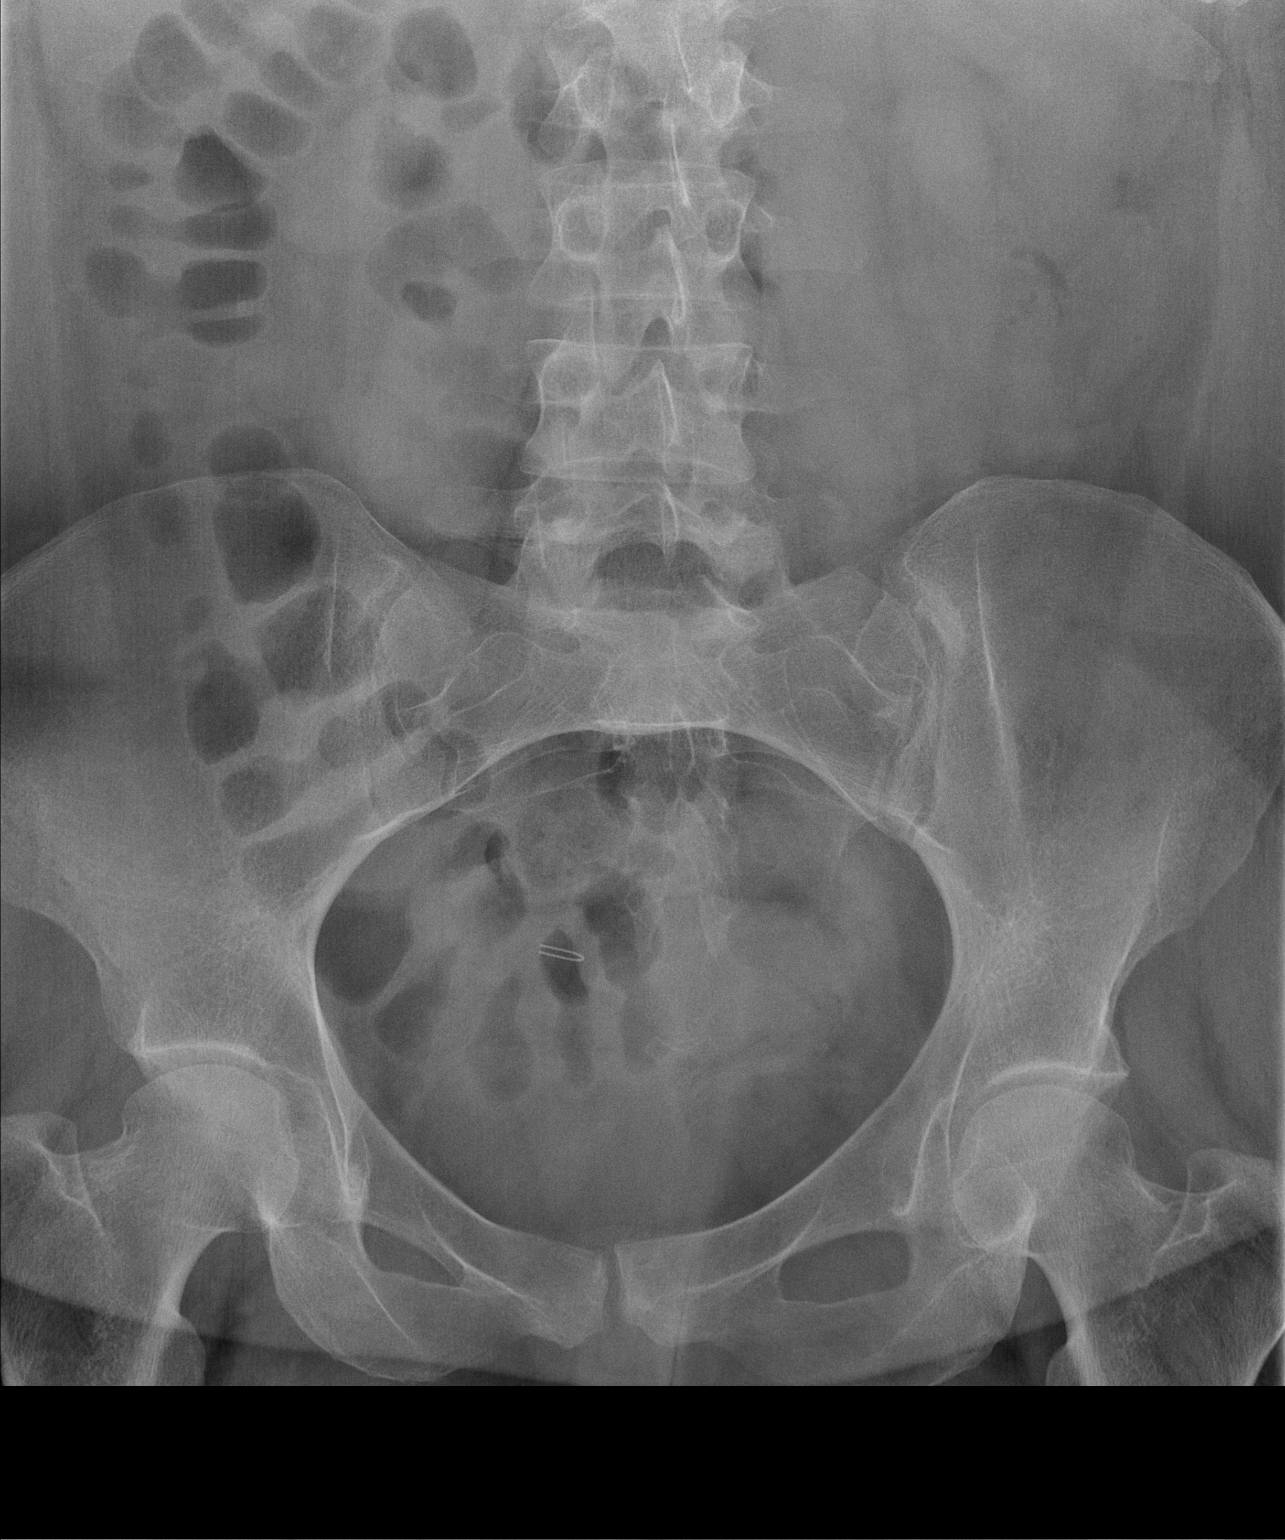

[t abdomen supine (2 of 2)]
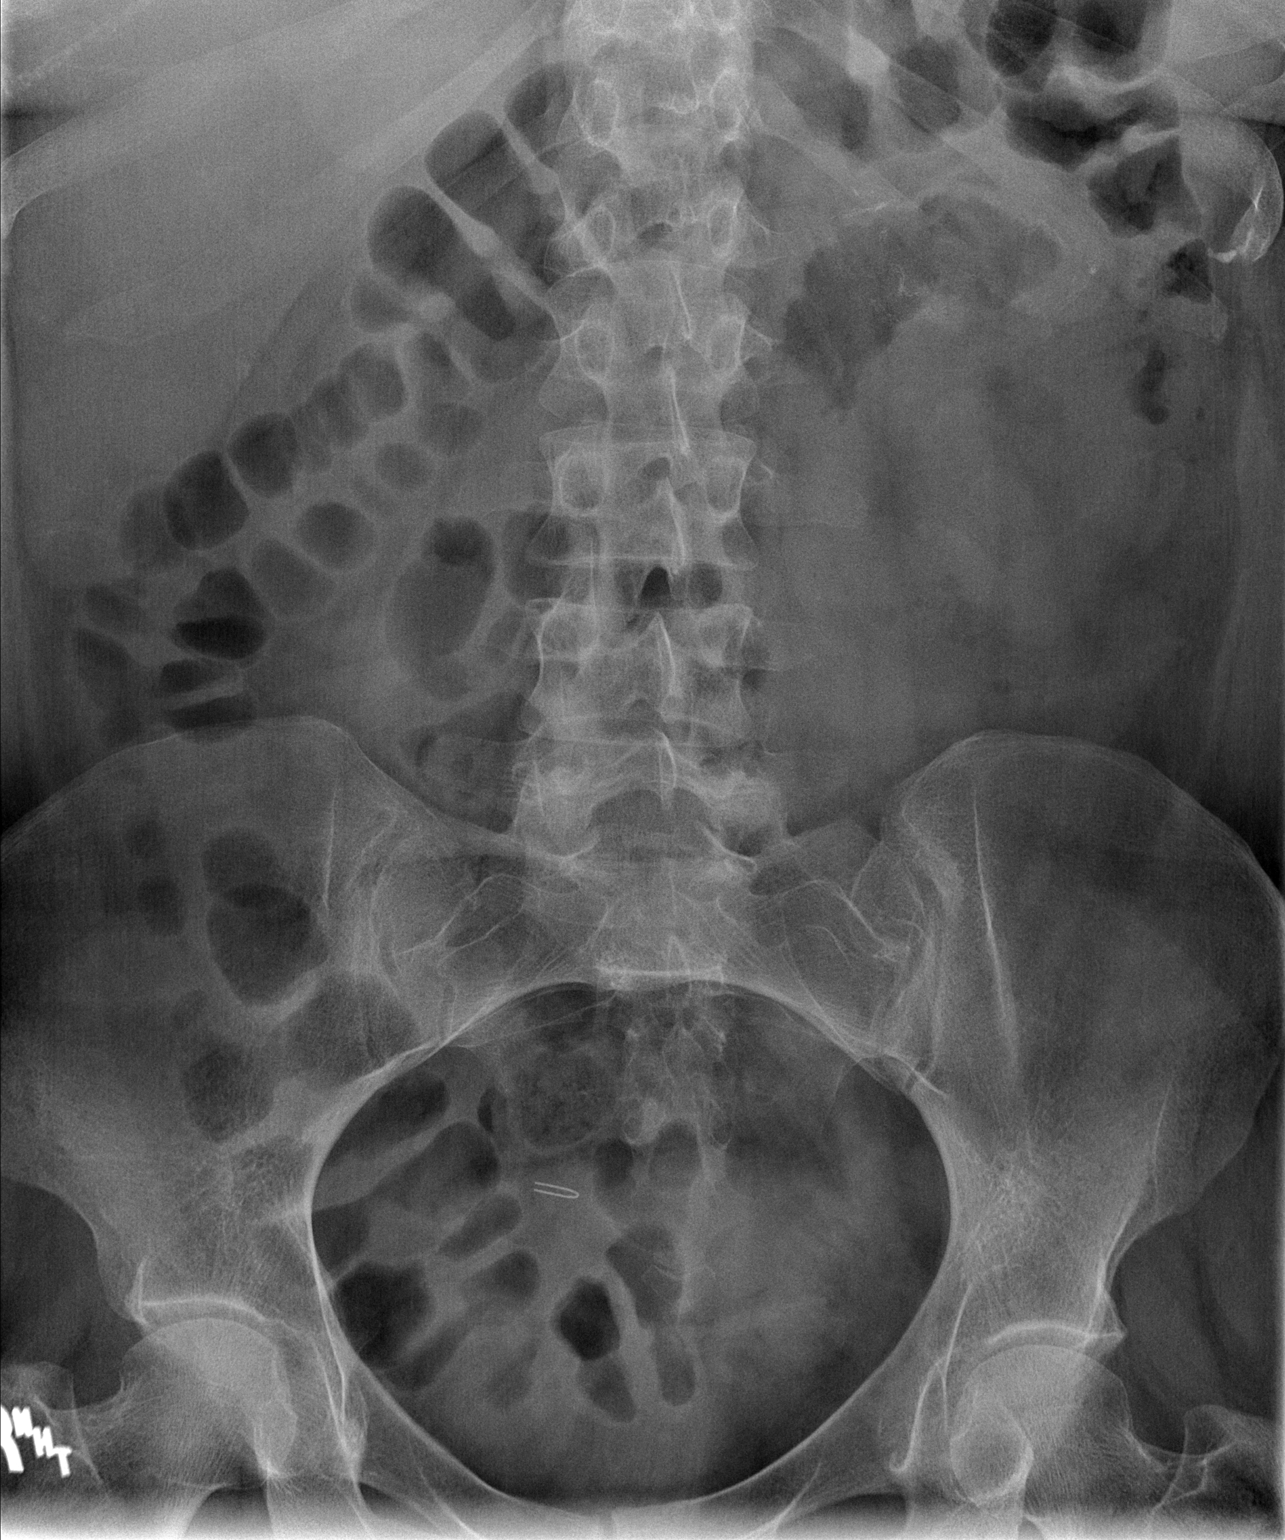

[2 of 2 positions shown; findings below may reference images not displayed]

FINDINGS: Surgical clip in the pelvis. Nonobstructed bowel-gas pattern.
Postsurgical changes in the left upper quadrant. No definite
radiopaque calculi are seen.
IMPRESSION: 1. Nonobstructed bowel-gas pattern
2. No radiopaque calculi are seen

## 2016-09-15 IMAGING — US US RENAL
1 series · 14 of 25 positions shown · non-contrast
Comparison: None.

CLINICAL DATA: Sudden on set left flank pain for 3 hours.

EXAM:
RENAL / URINARY TRACT ULTRASOUND COMPLETE

[Series 1: us renal · 0.20mm/px · 14 of 76 slices shown]
[im 1/76]
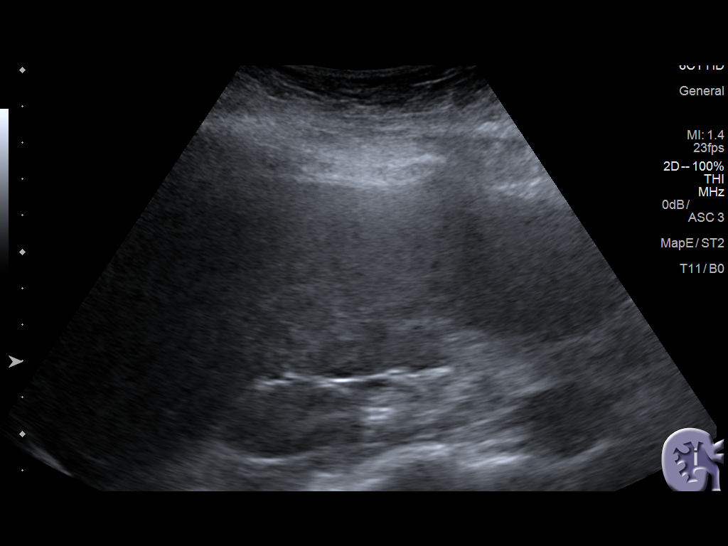
[im 7/76]
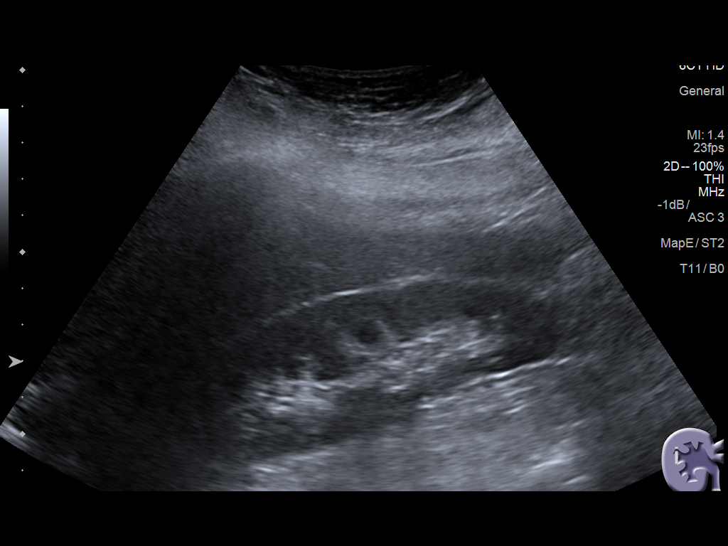
[im 13/76]
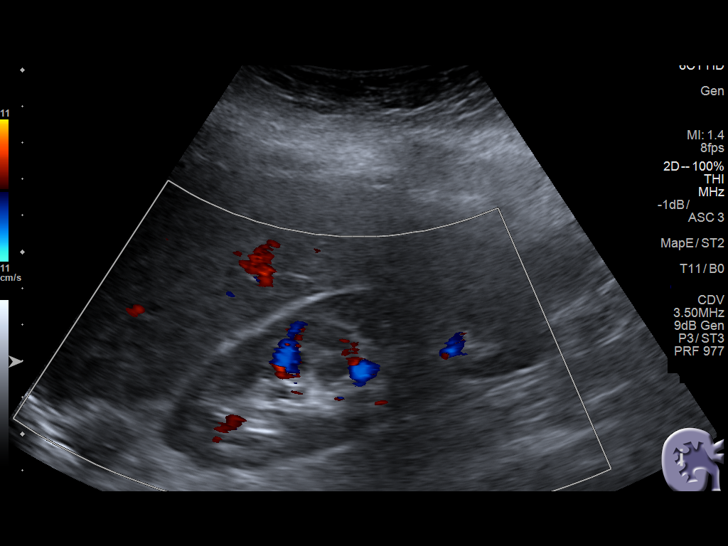
[im 19/76]
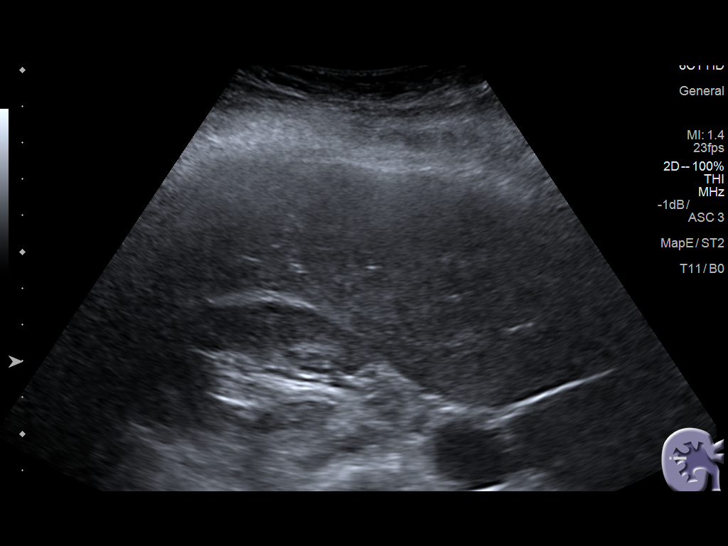
[im 26/76]
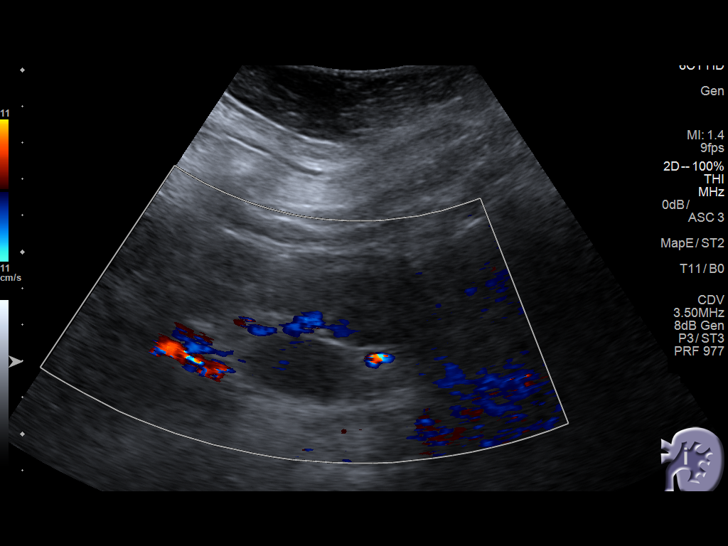
[im 29/76]
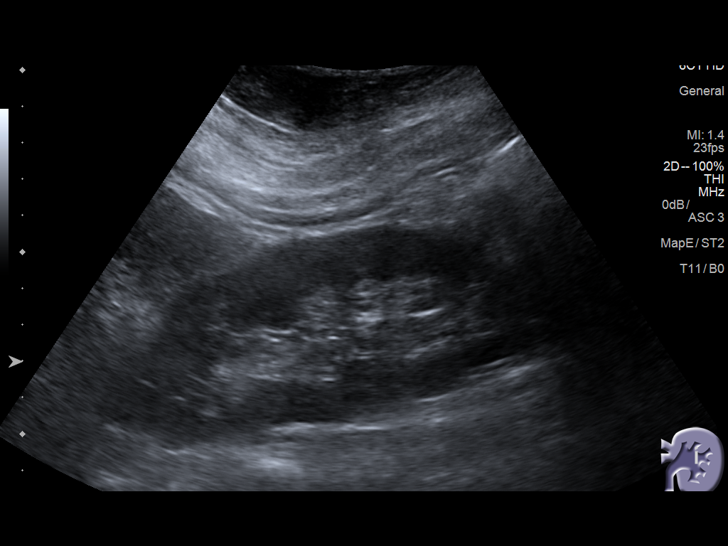
[im 35/76]
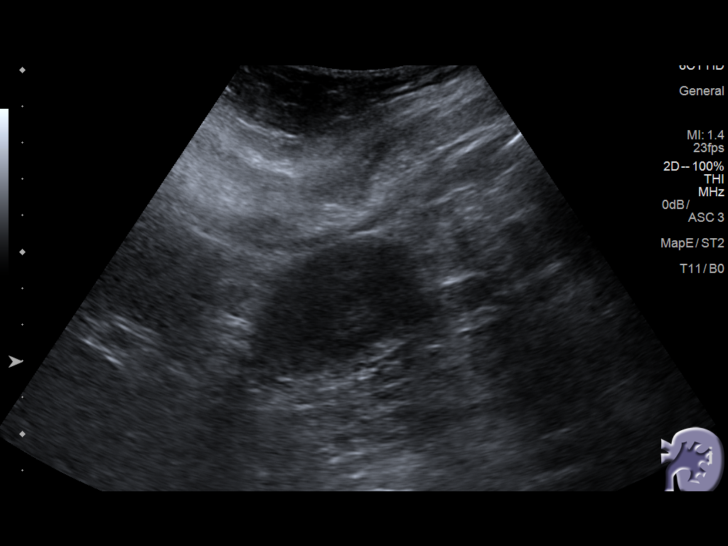
[im 41/76]
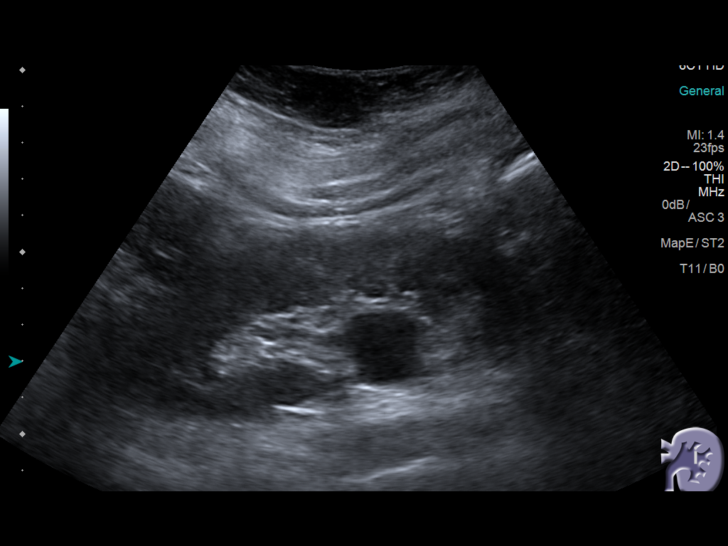
[im 47/76]
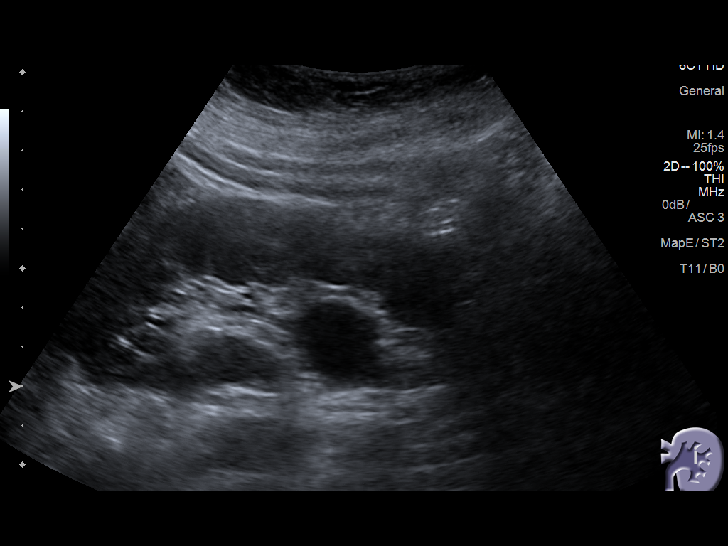
[im 51/76]
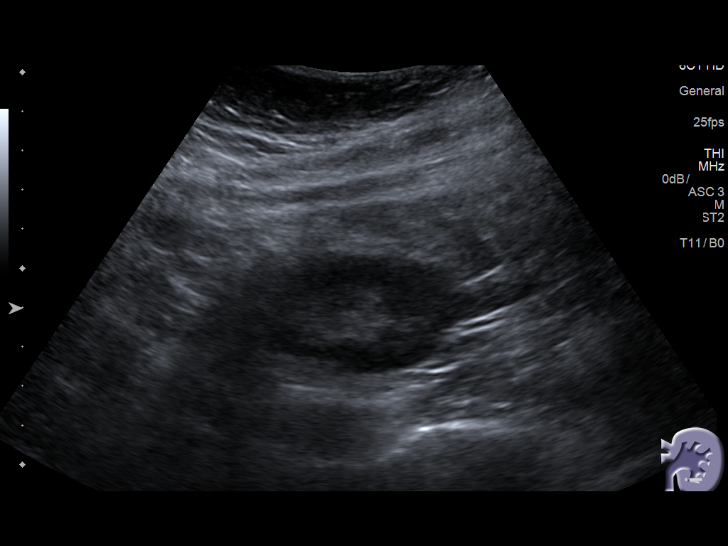
[im 57/76]
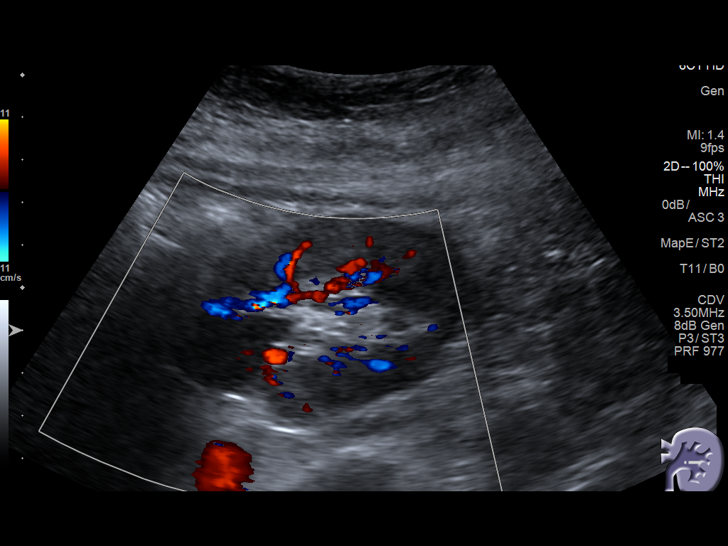
[im 63/76]
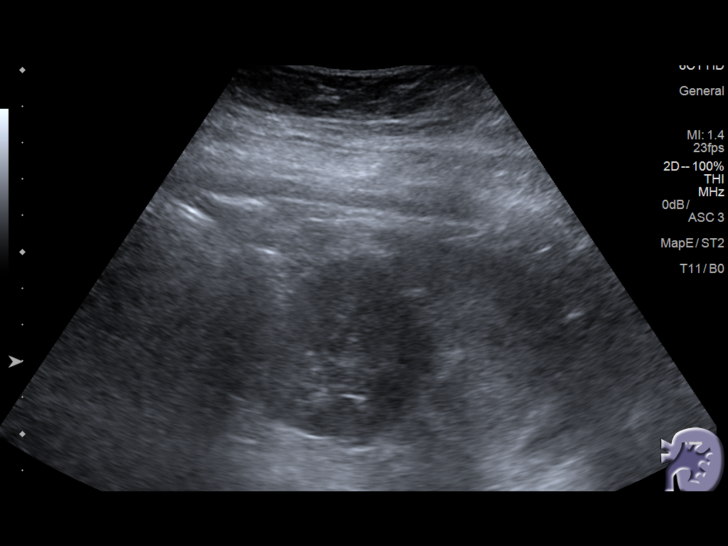
[im 69/76]
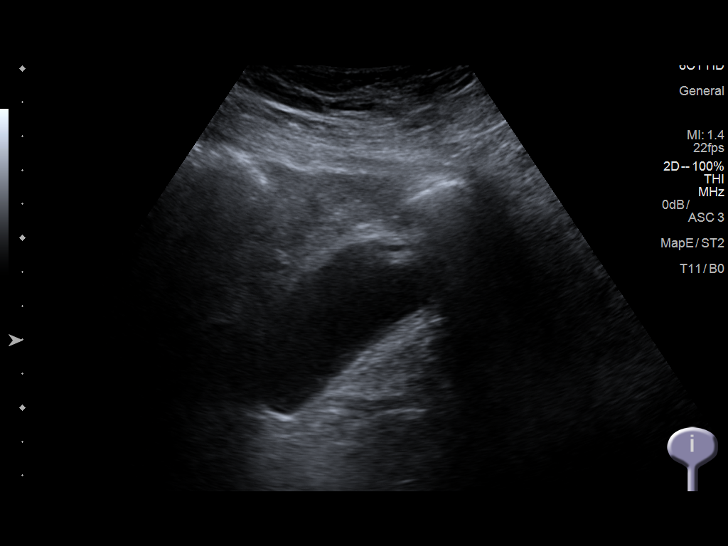
[im 76/76]
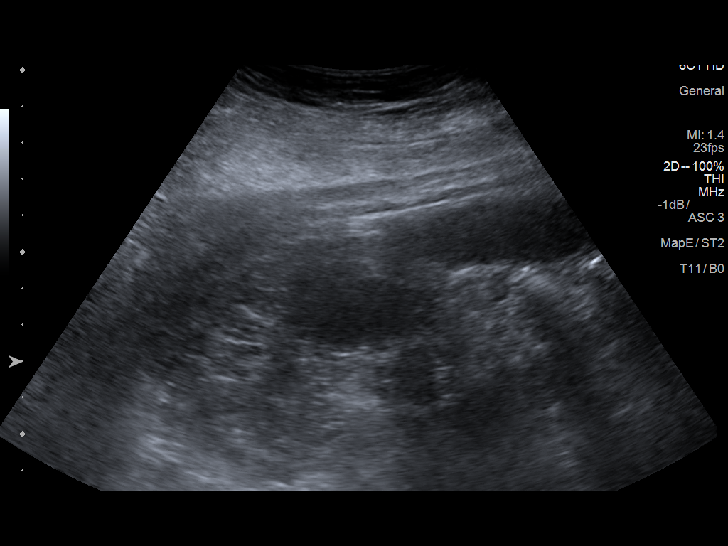

[14 of 25 positions shown; findings below may reference images not displayed]

FINDINGS: Right Kidney:

Length: 13 cm. Echogenicity within normal limits. No mass or
hydronephrosis visualized.

Left Kidney:

Length: 13 cm. Echogenicity within normal limits. There is
mild-to-moderate left hydronephrosis.

Bladder:

The bladder is decompressed limiting evaluation. Evaluation for
ureteral jets are limited.
IMPRESSION: Mild to moderate left hydronephrosis.

## 2016-09-15 MED ORDER — POLYETHYL GLYCOL-PROPYL GLYCOL 0.4-0.3 % OP SOLN: 2.0000 [drp] | Freq: Two times a day (BID) | OPHTHALMIC | 0 refills | Status: DC

## 2016-09-15 MED ORDER — DEXAMETHASONE SODIUM PHOSPHATE 10 MG/ML IJ SOLN: 10.0000 mg | Freq: Once | INTRAMUSCULAR | Status: DC

## 2016-09-15 NOTE — Progress Notes (Signed)
S: c/o rash and itching, started itching yesterday, no new foods, detergents, soaps, etc, took zantac and benadryl which hasn't helped, no cp/sob/throat swelling  O: vitals wnl, nad, skin with fine red rash on face, some in folds of abdomen, none on back but scratch marks are noted, throat wnl, neck supple no lymph, lungs c t a, cv rrr  A: rash  P: decadron 10 mg im

## 2016-09-15 NOTE — Progress Notes (Signed)
E visit for Allergic Rhinitis We are sorry that you are not feeling well.  Her is how we plan to help!  Based on what you have shared with me it looks like you have Allergic Rhinitis.  Rhinitis is when a reaction occurs that causes nasal congestion, runny nose, sneezing, and itching.  Most types of rhinitis are caused by an inflammation and are associated with symptoms in the eyes ears or throat. There are several types of rhinitis.  The most common are acute rhinitis, which is usually caused by a viral illness, allergic or seasonal rhinitis, and nonallergic or year-round rhinitis.  Nasal allergies occur certain times of the year.  Allergic rhinitis is caused when allergens in the air trigger the release of histamine in the body.  Histamine causes itching, swelling, and fluid to build up in the fragile linings of the nasal passages, sinuses and eyelids.  An itchy nose and clear discharge are common.  I recommend the following over the counter treatments: Xyzal 5 mg take 1 tablet daily   You may also benefit from eye drops such as: Systane 1-2 driops each eye twice daily as needed  HOME CARE:   You can use an over-the-counter saline nasal spray as needed  Avoid areas where there is heavy dust, mites, or molds  Stay indoors on windy days during the pollen season  Keep windows closed in home, at least in bedroom; use air conditioner.  Use high-efficiency house air filter  Keep windows closed in car, turn AC on re-circulate  Avoid playing out with dog during pollen season  GET HELP RIGHT AWAY IF:   If your symptoms do not improve within 10 days  You become short of breath  You develop yellow or green discharge from your nose for over 3 days  You have coughing fits  MAKE SURE YOU:   Understand these instructions  Will watch your condition  Will get help right away if you are not doing well or get worse  Thank you for choosing an e-visit. Your e-visit answers were  reviewed by a board certified advanced clinical practitioner to complete your personal care plan. Depending upon the condition, your plan could have included both over the counter or prescription medications. Please review your pharmacy choice. Be sure that the pharmacy you have chosen is open so that you can pick up your prescription now.  If there is a problem you may message your provider in MyChart to have the prescription routed to another pharmacy. Your safety is important to [place]. If you have drug allergies check your prescription carefully.  For the next 24 hours, you can use MyChart to ask questions about today's visit, request a non-urgent call back, or ask for a work or school excuse from your e-visit provider. You will get an email in the next two days asking about your experience. I hope that your e-visit has been valuable and will speed your recovery.

## 2016-09-23 ENCOUNTER — Telehealth: Payer: Self-pay | Admitting: *Deleted

## 2016-09-23 ENCOUNTER — Ambulatory Visit: Payer: 59 | Admitting: Podiatry

## 2016-09-23 NOTE — Telephone Encounter (Addendum)
-----   Message from [name], DPM sent at 09/22/2016  7:53 AM EDT ----- Negative for fungus. Please let her know.09/23/2016-Left message informing pt of Dr. Gabriel Rung review of results.

## 2016-09-30 ENCOUNTER — Ambulatory Visit (HOSPITAL_BASED_OUTPATIENT_CLINIC_OR_DEPARTMENT_OTHER)
Admission: RE | Admit: 2016-09-30 | Discharge: 2016-09-30 | Disposition: A | Discharge status: Home / Self Care | Payer: 59 | Source: Ambulatory Visit | Attending: Podiatry | Admitting: Podiatry

## 2016-09-30 ENCOUNTER — Encounter: Payer: Self-pay | Admitting: Podiatry

## 2016-09-30 ENCOUNTER — Ambulatory Visit (INDEPENDENT_AMBULATORY_CARE_PROVIDER_SITE_OTHER): Payer: 59 | Admitting: Podiatry

## 2016-09-30 DIAGNOSIS — Z969 Presence of functional implant, unspecified: Secondary | ICD-10-CM | POA: Diagnosis not present

## 2016-09-30 DIAGNOSIS — Z9889 Other specified postprocedural states: Secondary | ICD-10-CM | POA: Insufficient documentation

## 2016-09-30 DIAGNOSIS — M19071 Primary osteoarthritis, right ankle and foot: Secondary | ICD-10-CM | POA: Insufficient documentation

## 2016-09-30 DIAGNOSIS — M24674 Ankylosis, right foot: Secondary | ICD-10-CM | POA: Diagnosis not present

## 2016-09-30 IMAGING — DX DG FOOT COMPLETE 3+V*R*
3 series · 3 of 3 positions shown · non-contrast
Comparison: Plain films right foot [DATE] and [DATE].

CLINICAL DATA: History of prior with right foot surgeries. No
current complaints.

EXAM:
RIGHT FOOT COMPLETE - 3+ VIEW

[foot ap]
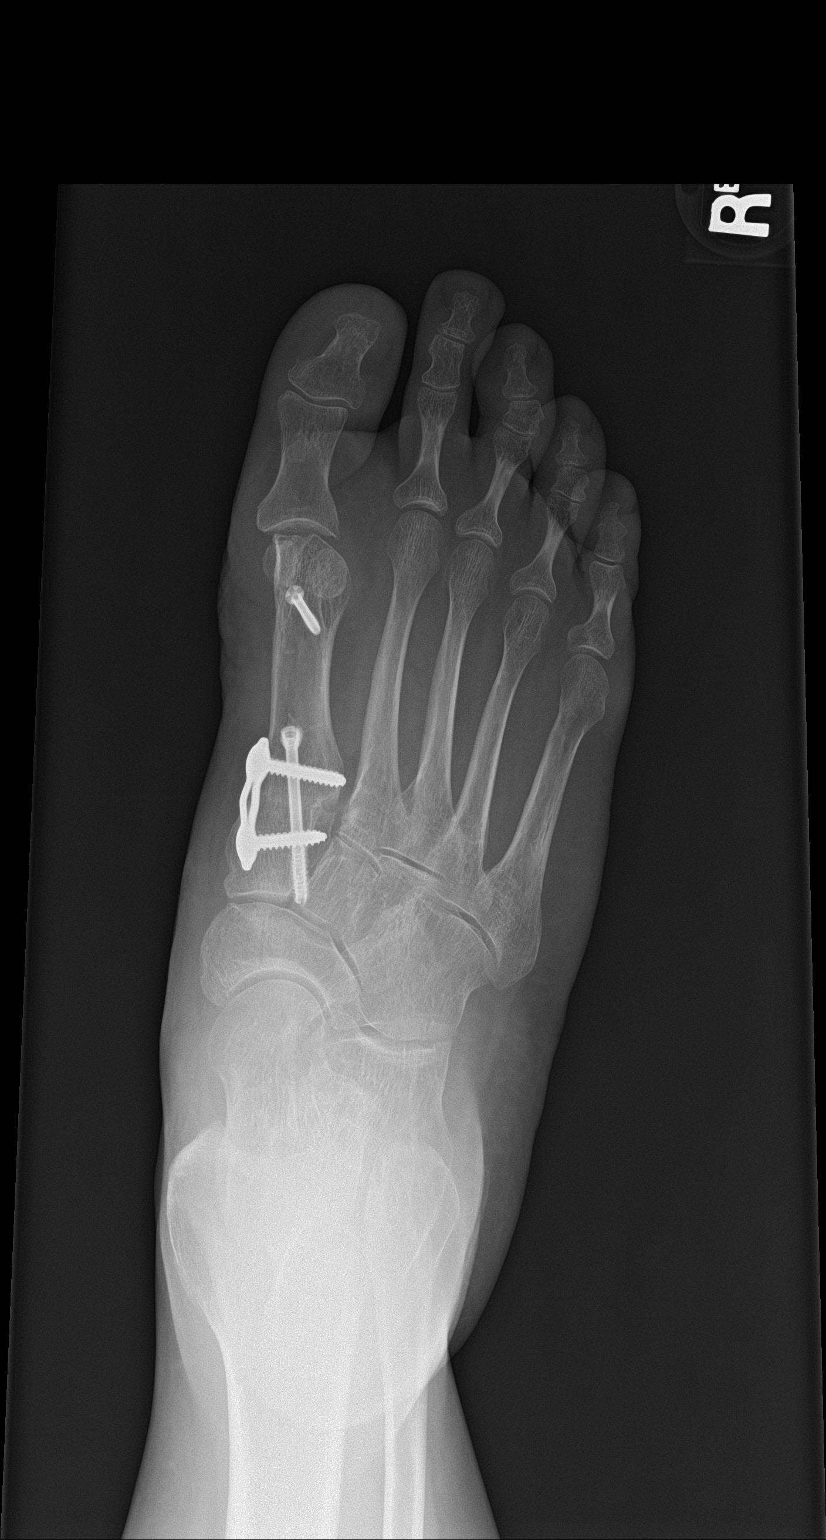

[foot obl]
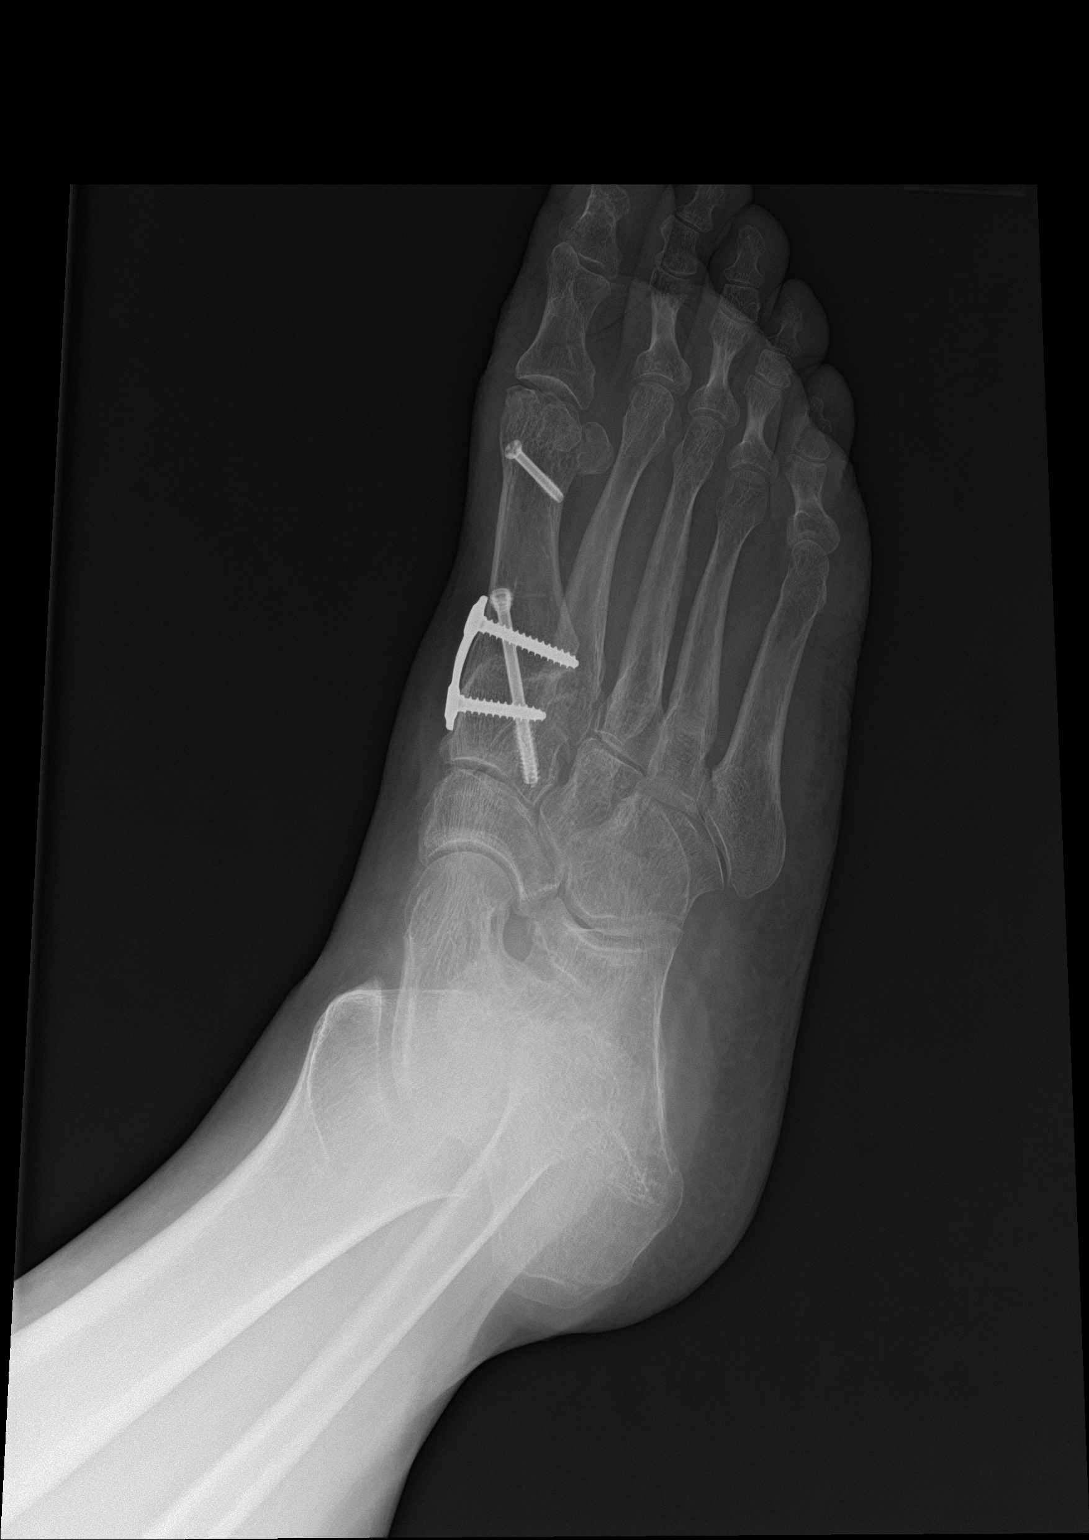

[foot lat]
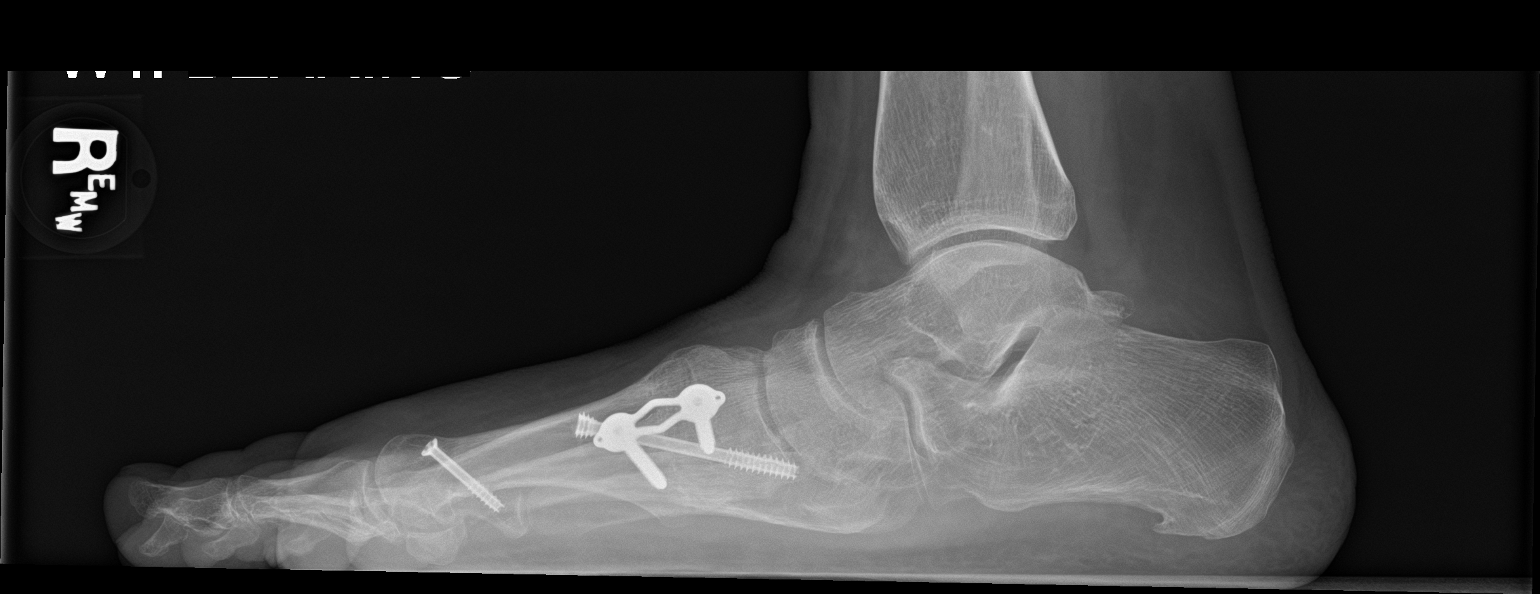

[3 of 3 positions shown; findings below may reference images not displayed]

FINDINGS: The patient is status post bunionectomy with a healed osteotomy
identified. The first tarsometatarsal joint is also been fused with
medial plate and screws and a single retrograde screw across the
joint. Hardware is intact. The joint is solidly fused. There is mild
first MTP osteoarthritis. No acute bony or joint abnormality is
identified.
IMPRESSION: Status post bunionectomy without evidence complication.

Solid first tarsometatarsal joint fusion.

Mild first MTP osteoarthritis.

No acute abnormality.

## 2016-09-30 NOTE — Patient Instructions (Signed)

## 2016-09-30 NOTE — Telephone Encounter (Signed)
"  Dr. Ardelle Anton wanted me to call and confirm that I am scheduled for surgery on Wednesday of next week, April 25.  I'm having surgery on my right foot."

## 2016-10-02 NOTE — Telephone Encounter (Signed)
"  I'm just calling to see if I am still on for Wednesday, April 25, for surgery."  Yes, we have you scheduled.  You should get a call from the surgical center a day or two prior to surgery date with the arrival time.

## 2016-10-04 NOTE — Progress Notes (Signed)
Subjective: 31 year old female presents the office today for follow-up evaluation of right medial hallux status post partial nail avulsion. She said that she is doing well she has not had any redness or drainage or any swelling. She also states that she does continue to get pain along the previous surgical site along the medial aspect pointing the medial first metatarsocuneiform joint overlying the area of the hardware. She states this is the only area that causes pain with shoes. She would like to go ahead and proceed with surgery to remove the plate. We've tried orthotics and shoe changes at this point she is requesting hardware removal. Denies any systemic complaints such as fevers, chills, nausea, vomiting. No acute changes since last appointment, and no other complaints at this time.   Objective: AAO x3, NAD DP/PT pulses palpable bilaterally, CRT less than 3 seconds Incision from the prior surgery well-healed. There is tenderness on the first metatarsocuneiform joint medially along the area of palpable hardware. Exostosis is also present to this area. There is no overlying edema, erythema, increase in warmth. Status post partial nail avulsion of the right hallux which is healed well. Scabs along the area and there is no edema, erythema, drainage or pus or any clinical signs of infection. No open lesions or pre-ulcerative lesions.  No pain with calf compression, swelling, warmth, erythema  Assessment: Status post right hallux toenail avulsion doing well; painful hardware, exostosis  Plan: -All treatment options discussed with the patient including all alternatives, risks, complications.  -Status post partial nail avulsion which is healing well. Continue the Band-Aid for protection. -In regards to the painful hardware discussed with the hardware removal and this time she wishes to proceed. I ordered x-rays to ensure solid fusion. I discussed the surgery as well as postoperative course and she  wishes to go ahead and proceed with this area and we will plan on doing this next week and she is already scheduled. Will likely leave the interfrag screw in place and she is aware.  The incision placement as well as the postoperative course was discussed with the patient. I discussed risks of the surgery which include, but not limited to, infection, bleeding, pain, swelling, need for further surgery, delayed or nonhealing, painful or ugly scar, numbness or sensation changes, over/under correction, recurrence, transfer lesions, further deformity, hardware failure, DVT/PE, fracture, loss of toe/foot. Patient understands these risks and wishes to proceed with surgery. The surgical consent was reviewed with the patient all 3 pages were signed. No promises or guarantees were given to the outcome of the procedure. All questions were answered to the best of my ability. Before the surgery the patient was encouraged to call the office if there is any further questions. [facility] basis. -Patient encouraged to call the office with any questions, concerns, change in symptoms.   [name], DPM

## 2016-10-06 MED FILL — clonazePAM 0.5 MG TABS: 0.5 | 30 days supply | Qty: 60 | Fill #0

## 2016-10-07 MED FILL — ZOLPIDEM TARTRATE 10 MG TAB: 10 | 30 days supply | Qty: 30 | Fill #1

## 2016-10-08 ENCOUNTER — Encounter: Payer: Self-pay | Admitting: Podiatry

## 2016-10-08 DIAGNOSIS — Z472 Encounter for removal of internal fixation device: Secondary | ICD-10-CM | POA: Diagnosis not present

## 2016-10-08 DIAGNOSIS — Z4889 Encounter for other specified surgical aftercare: Secondary | ICD-10-CM

## 2016-10-08 DIAGNOSIS — M25774 Osteophyte, right foot: Secondary | ICD-10-CM | POA: Diagnosis not present

## 2016-10-08 DIAGNOSIS — Z967 Presence of other bone and tendon implants: Secondary | ICD-10-CM | POA: Diagnosis not present

## 2016-10-08 DIAGNOSIS — K259 Gastric ulcer, unspecified as acute or chronic, without hemorrhage or perforation: Secondary | ICD-10-CM | POA: Diagnosis not present

## 2016-10-08 MED FILL — OXYCODONE/APAP 5/325 MG TAB: 5-325 | 4 days supply | Qty: 30 | Fill #0

## 2016-10-08 MED FILL — PROMETHAZINE 25 MG TABLET: 25 | 6 days supply | Qty: 20 | Fill #0

## 2016-10-08 MED FILL — CLINDAMYCIN HCL 150 MG CAPS: 150 | 7 days supply | Qty: 21 | Fill #0

## 2016-10-09 ENCOUNTER — Telehealth: Payer: Self-pay | Admitting: *Deleted

## 2016-10-09 MED ORDER — OXYCODONE-ACETAMINOPHEN 5-325 MG PO TABS: ORAL_TABLET | ORAL | 0 refills | Status: DC

## 2016-10-09 NOTE — Telephone Encounter (Addendum)
Pt states she is counting out her pain pills from her surgery yesterday and would like to know if she can get a rx to be filled on Saturday 10/11/2016.Left message informing pt of Dr. Gabriel Rung orders.10/13/2016-Pt states she took the ace off and it seemed to make the pain worse, like the ace was holding the dressing so it would hold the surgery site in place. I told pt to put the ace back on to secure the dressing to see if helped with comfort. Pt states she has an appt tomorrow and Dr. Ardelle Anton had refilled the pain medication. Pt states she took Protonix and ANSAID, but can't take due to a gastric bypass and history of ulcers. I told her not to take the ANSAID again. 10/20/2016-Pt states at her last visit Dr. Ardelle Anton stated she could have pain medication, and she takes percocet and hydrocodone. Pt states she can't pick up today in [place], but will be able to pick up in [place]. I informed pt no one would be in the [facility] office tomorrow. Pt states she can get someone to pick up in the [place] office today or tomorrow.10/22/2016-Pt states Dr. Ardelle Anton ordered 5 days of the Hydrocodone and it will run out on Saturday.  10/23/2016-DrArdelle Anton okayed refill of the hydrocodone to be filled on 10/25/2016. I informed pt and she will pick up in the Center office.11/13/2016-Pt request refill pain medication. Dr. Ardelle Anton stater Hydrocodone 5/325mg  #20 one tablet every 6 hours prn foot pain. Left message informing pt she could pick up the Hydrocodone 5/325 in the [place] office.11/20/2016-Pt requested refill of pain medication. Dr. Ardelle Anton states okay to refill one last prescription of the Hydrocodone 5/325. I informed pt of Dr. Gabriel Rung orders, pt states understanding.

## 2016-10-09 NOTE — Telephone Encounter (Signed)
It is percocet 5/325 1-2 every 4-6h prn. She should be due for it Sunday. She can come by Friday to get a new rx with the date on it to be filled.

## 2016-10-13 ENCOUNTER — Other Ambulatory Visit: Payer: Self-pay | Admitting: *Deleted

## 2016-10-13 ENCOUNTER — Telehealth: Payer: Self-pay | Admitting: Sports Medicine

## 2016-10-13 ENCOUNTER — Encounter: Payer: 59 | Admitting: Podiatry

## 2016-10-13 MED ORDER — OXYCODONE-ACETAMINOPHEN 5-325 MG PO TABS: 1.0000 | ORAL_TABLET | Freq: Three times a day (TID) | ORAL | 0 refills | Status: DC | PRN

## 2016-10-13 MED FILL — OXYCODONE W/APAP 5/325 TAB: 5-325 | 7 days supply | Qty: 20 | Fill #0

## 2016-10-13 NOTE — Telephone Encounter (Signed)
Left message requesting pt to call for instruction to help with discomfort.

## 2016-10-13 NOTE — Telephone Encounter (Signed)
Patient called this AM stating that she had surgery with Dr. Ardelle Anton and her pain medication is not working. Reports that her foot is throbbing and that she has tried ice, elevation, taking motrin in between doses and loosening dressing/wiggling toes and her foot still throbs really badly. I advised patient to call office when opens to speak with nurse for a new Rx for pain medication. Patient expressed understanding and states that she will call office after 8am. -Dr. Marylene Land

## 2016-10-14 ENCOUNTER — Encounter: Payer: 59 | Admitting: Podiatry

## 2016-10-14 ENCOUNTER — Encounter: Payer: Self-pay | Admitting: Podiatry

## 2016-10-14 ENCOUNTER — Ambulatory Visit (INDEPENDENT_AMBULATORY_CARE_PROVIDER_SITE_OTHER): Payer: 59 | Admitting: Podiatry

## 2016-10-14 DIAGNOSIS — Z969 Presence of functional implant, unspecified: Secondary | ICD-10-CM

## 2016-10-14 MED ORDER — HYDROCODONE-ACETAMINOPHEN 10-325 MG PO TABS: 1.0000 | ORAL_TABLET | Freq: Four times a day (QID) | ORAL | 0 refills | Status: DC | PRN

## 2016-10-14 MED FILL — HYDROCODON-APAP 10-325: 10-325 | 5 days supply | Qty: 20 | Fill #0

## 2016-10-14 NOTE — Progress Notes (Signed)
Subjective: [name] [name] is a 31 y.o. is seen today in office s/p right HWR preformed on 10/08/2016. They state their pain is improving but she is asking for vicodin as she "cannot function" with the percocet. She has remained in the CAM boot. Denies any systemic complaints such as fevers, chills, nausea, vomiting. No calf pain, chest pain, shortness of breath.   Objective: General: No acute distress, AAOx3  DP/PT pulses palpable 2/4, CRT < 3 sec to all digits.  Protective sensation intact. Motor function intact.  Right foot: Incision is well coapted without any evidence of dehiscence and sutures are intact. There is no surrounding erythema, ascending cellulitis, fluctuance, crepitus, malodor, drainage/purulence. There is minimal edema around the surgical site. There is mild pain along the surgical site.  No other areas of tenderness to bilateral lower extremities.  No other open lesions or pre-ulcerative lesions.  No pain with calf compression, swelling, warmth, erythema.   Assessment and Plan:  Status post right foot surgery, doing well with no complications   -Treatment options discussed including all alternatives, risks, and complications -X-ray ordered -Antibiotic woman was applied followed by a bandage. Keep the dressing clean, dry, intact that she can start changing the bandage daily with antibiotic ointment and a gauze dressing daily towards the end of this week. -I did fill Vicodin however discussed with her that she can work or drive while taking pain medication. She does have percocet if needed for significant pain at night.  -Wear CAM boot at all times.  -Ice/elevation -Pain medication as needed. -Monitor for any clinical signs or symptoms of infection and DVT/PE and directed to call the office immediately should any occur or go to the ER. -Follow-up 2 weeks or sooner if any problems arise. In the meantime, encouraged to call the office with any questions, concerns, change in  symptoms.   [name], DPM

## 2016-10-20 ENCOUNTER — Encounter: Payer: 59 | Admitting: Podiatry

## 2016-10-20 MED ORDER — HYDROCODONE-ACETAMINOPHEN 10-325 MG PO TABS: ORAL_TABLET | ORAL | 0 refills | Status: DC

## 2016-10-20 MED FILL — HYDROCODON-APAP 10-325: 10-325 | 5 days supply | Qty: 20 | Fill #0

## 2016-10-20 NOTE — Telephone Encounter (Signed)
[facility] will be in [facility] tomorrow. OK to refill

## 2016-10-23 MED ORDER — HYDROCODONE-ACETAMINOPHEN 10-325 MG PO TABS: 1.0000 | ORAL_TABLET | Freq: Four times a day (QID) | ORAL | 0 refills | Status: DC | PRN

## 2016-10-28 ENCOUNTER — Encounter: Payer: Self-pay | Admitting: Podiatry

## 2016-10-28 ENCOUNTER — Ambulatory Visit (INDEPENDENT_AMBULATORY_CARE_PROVIDER_SITE_OTHER): Payer: Self-pay | Admitting: Podiatry

## 2016-10-28 DIAGNOSIS — Z969 Presence of functional implant, unspecified: Secondary | ICD-10-CM

## 2016-10-28 DIAGNOSIS — M2012 Hallux valgus (acquired), left foot: Secondary | ICD-10-CM

## 2016-10-28 MED ORDER — HYDROCODONE-ACETAMINOPHEN 10-325 MG PO TABS: 1.0000 | ORAL_TABLET | Freq: Four times a day (QID) | ORAL | 0 refills | Status: DC | PRN

## 2016-10-28 MED ORDER — HYDROCODONE-ACETAMINOPHEN 5-325 MG PO TABS: 1.0000 | ORAL_TABLET | Freq: Four times a day (QID) | ORAL | 0 refills | Status: DC | PRN

## 2016-10-28 MED FILL — HYDROCODON-APAP 10-325: 10-325 | 5 days supply | Qty: 20 | Fill #0

## 2016-10-28 NOTE — Progress Notes (Signed)
Subjective: [name] DixonSamantha D [name] is a 31 y.o. is seen today in office s/p right HWR preformed on 10/08/2016. Patient results today for suture removal. She states that overall she is doing better and her pain is improved. She is asking for another refill of Vicodin 10/325. After the 5 days at this she would like to decrease to Vicodin 5/325. She states that she'll gradually wean off the pain medications for coming off of it "cold [place]". Overall otherwise should that she is doing better and she is continuing the CAM boot. Denies any systemic complaints such as fevers, chills, nausea, vomiting. No calf pain, chest pain, shortness of breath.   Objective: General: No acute distress, AAOx3  DP/PT pulses palpable 2/4, CRT < 3 sec to all digits.  Protective sensation intact. Motor function intact.  Right foot: Incision is well coapted without any evidence of dehiscence and sutures are intact. There is no surrounding erythema, ascending cellulitis, fluctuance, crepitus, malodor, drainage/purulence. There is minimal edema around the surgical site. There is mild pain along the surgical site. There is no erythema or increased warmth. No clinical signs of infection. No other areas of tenderness to bilateral lower extremities.  No other open lesions or pre-ulcerative lesions.  No pain with calf compression, swelling, warmth, erythema.   Assessment and Plan:  Status post right foot surgery, doing well with no complications   -Treatment options discussed including all alternatives, risks, and complications -Sutures removed today without complications. Recommended anti-buttock ointment and dressing daily. She can start to shower tomorrow. -Remaining CAM boot. She states that she was exiting the boot for about 6 weeks due to work. Discussed the gradual transition to regular shoe. -Ice and elevation -I did refill Vicodin 10/325 today. Also I did go ahead and give her prescription that she get filled on Saturday for  Vicodin 5/325 hopefully without lesions are to decrease pain medication as well some more. -Compression anklet -Monitor for any clinical signs or symptoms of infection and DVT/PE and directed to call the office immediately should any occur or go to the ER. -Follow-up 2 weeks or sooner if any problems arise. In the meantime, encouraged to call the office with any questions, concerns, change in symptoms.   Ovid CurdMatthew Wagoner, DPM

## 2016-11-04 NOTE — Progress Notes (Signed)
Patient ID: [name], female   DOB: 07/17/1985, 31 y.o.   MRN: [id number] 1. Right foot removal of hardware 2. Removal of bone spur

## 2016-11-05 ENCOUNTER — Encounter: Payer: Self-pay | Admitting: Family

## 2016-11-05 DIAGNOSIS — F32A Depression, unspecified: Secondary | ICD-10-CM

## 2016-11-05 DIAGNOSIS — F419 Anxiety disorder, unspecified: Secondary | ICD-10-CM

## 2016-11-05 DIAGNOSIS — F329 Major depressive disorder, single episode, unspecified: Secondary | ICD-10-CM

## 2016-11-05 DIAGNOSIS — G479 Sleep disorder, unspecified: Secondary | ICD-10-CM

## 2016-11-05 MED ORDER — ZOLPIDEM TARTRATE 10 MG PO TABS: 10.0000 mg | ORAL_TABLET | Freq: Every evening | ORAL | 2 refills | Status: DC | PRN

## 2016-11-05 MED ORDER — CLONAZEPAM 0.5 MG PO TABS: 0.5000 mg | ORAL_TABLET | Freq: Two times a day (BID) | ORAL | 1 refills | Status: DC | PRN

## 2016-11-05 MED ORDER — DULOXETINE HCL 60 MG PO CPEP: 60.0000 mg | ORAL_CAPSULE | Freq: Every day | ORAL | 1 refills | Status: DC

## 2016-11-06 MED FILL — ZOLPIDEM TARTRATE 10 MG TAB: 10 | 30 days supply | Qty: 30 | Fill #0

## 2016-11-06 MED FILL — clonazePAM 0.5 MG TABS: 0.5 | 30 days supply | Qty: 60 | Fill #0

## 2016-11-06 MED FILL — DULoxetine HCL 60 MG CPEP: 60 | 30 days supply | Qty: 30 | Fill #0

## 2016-11-06 NOTE — Telephone Encounter (Signed)
Faxed script back to [facility].../lmb 

## 2016-11-13 ENCOUNTER — Ambulatory Visit (INDEPENDENT_AMBULATORY_CARE_PROVIDER_SITE_OTHER): Payer: 59

## 2016-11-13 ENCOUNTER — Ambulatory Visit (INDEPENDENT_AMBULATORY_CARE_PROVIDER_SITE_OTHER): Payer: Self-pay | Admitting: Podiatry

## 2016-11-13 DIAGNOSIS — Z969 Presence of functional implant, unspecified: Secondary | ICD-10-CM | POA: Diagnosis not present

## 2016-11-13 MED ORDER — HYDROCODONE-ACETAMINOPHEN 5-325 MG PO TABS: 1.0000 | ORAL_TABLET | Freq: Four times a day (QID) | ORAL | 0 refills | Status: DC | PRN

## 2016-11-14 NOTE — Progress Notes (Signed)
Subjective: [name] DixonSamantha D Mccann is a 31 y.o. is seen today in office s/p right HWR preformed on 10/08/2016. Since last, she returned to regular shoe when she is doing well on the surgical site. She points the navicular tuberosity only area where she gets some discomfort. Her pain is much improved but she still gets some discomfort as she is transition back into his shoe and working. She just for 1 more refill the Vicodin 5/325. She denies any increase in swelling or any drainage in the incision the wound is healing well. Denies any redness or warmth.  Denies any systemic complaints such as fevers, chills, nausea, vomiting. No calf pain, chest pain, shortness of breath.   Objective: General: No acute distress, AAOx3  DP/PT pulses palpable 2/4, CRT < 3 sec to all digits.  Protective sensation intact. Motor function intact.  Right foot: Incision is well coapted without any evidence of dehiscence and a scar is formed. There is minimal edema on the surgical site and this is improved. There is no erythema or increase in warmth. There is no tenderness palpation of the surgical site. The majority tenderness that she has on the navicular tuberosity in the medial aspect of the foot. This no pain on the course of the posterior tibial tendon. There is a decrease in medial arch height upon weightbearing.  No other open lesions or pre-ulcerative lesions.  No pain with calf compression, swelling, warmth, erythema.   Assessment and Plan:  Status post right foot surgery, doing well with no complications   -Treatment options discussed including all alternatives, risks, and complications -X-rays were obtained and reviewed the patient. No evidence of acute fracture. -Discussed with the pain that she is having is more than the navicular tuberosity and not the surgical site. Hopefully this will resolve as she is getting back into his shoe with the orthotic. I discussed physical therapy but she declined this. She's been  start some rehabilitation exercises at home that they discuss therapy. Continue ice and elevation. I did refill 1 last prescription for Vicodin.  -Follow-up in 4-6 weeks for final check or sooner if any problems are to arise. Call any questions or concerns  Ovid CurdMatthew Ammy Lienhard, DPM

## 2016-11-18 ENCOUNTER — Ambulatory Visit: Payer: 59 | Admitting: Podiatry

## 2016-11-20 MED ORDER — HYDROCODONE-ACETAMINOPHEN 5-325 MG PO TABS: 1.0000 | ORAL_TABLET | Freq: Four times a day (QID) | ORAL | 0 refills | Status: DC | PRN

## 2016-11-20 NOTE — Telephone Encounter (Signed)
She can get one last prescription.

## 2016-12-03 MED FILL — DULoxetine HCL 60 MG CPEP: 60 | 30 days supply | Qty: 30 | Fill #1

## 2016-12-03 MED FILL — clonazePAM 0.5 MG TABS: 0.5 | 30 days supply | Qty: 60 | Fill #1

## 2016-12-03 MED FILL — ZOLPIDEM TARTRATE 10 MG TAB: 10 | 30 days supply | Qty: 30 | Fill #1

## 2016-12-25 ENCOUNTER — Other Ambulatory Visit: Payer: Self-pay | Admitting: Family

## 2016-12-25 ENCOUNTER — Ambulatory Visit (INDEPENDENT_AMBULATORY_CARE_PROVIDER_SITE_OTHER): Payer: 59

## 2016-12-25 ENCOUNTER — Encounter: Payer: Self-pay | Admitting: Podiatry

## 2016-12-25 ENCOUNTER — Ambulatory Visit (INDEPENDENT_AMBULATORY_CARE_PROVIDER_SITE_OTHER): Payer: 59 | Admitting: Podiatry

## 2016-12-25 DIAGNOSIS — R52 Pain, unspecified: Secondary | ICD-10-CM

## 2016-12-25 DIAGNOSIS — M79673 Pain in unspecified foot: Secondary | ICD-10-CM | POA: Diagnosis not present

## 2016-12-25 DIAGNOSIS — M2141 Flat foot [pes planus] (acquired), right foot: Secondary | ICD-10-CM | POA: Diagnosis not present

## 2016-12-25 DIAGNOSIS — G8929 Other chronic pain: Secondary | ICD-10-CM | POA: Diagnosis not present

## 2016-12-25 DIAGNOSIS — M216X9 Other acquired deformities of unspecified foot: Secondary | ICD-10-CM

## 2016-12-25 DIAGNOSIS — F419 Anxiety disorder, unspecified: Secondary | ICD-10-CM

## 2016-12-25 DIAGNOSIS — G479 Sleep disorder, unspecified: Secondary | ICD-10-CM

## 2016-12-25 DIAGNOSIS — F329 Major depressive disorder, single episode, unspecified: Secondary | ICD-10-CM

## 2016-12-25 DIAGNOSIS — M2142 Flat foot [pes planus] (acquired), left foot: Secondary | ICD-10-CM | POA: Diagnosis not present

## 2016-12-25 DIAGNOSIS — F32A Depression, unspecified: Secondary | ICD-10-CM

## 2016-12-25 MED ORDER — TRAMADOL HCL 50 MG PO TABS: 50.0000 mg | ORAL_TABLET | Freq: Two times a day (BID) | ORAL | 0 refills | Status: DC

## 2016-12-25 MED FILL — traMADol HCL 50 MG TABS: 50 | 7 days supply | Qty: 15 | Fill #0

## 2016-12-26 NOTE — Telephone Encounter (Signed)
Could not locate rx so I called [facility] spoke w/Bryan verified if rx was faxed on yesterday. Per Arlys JohnBrian they have not received yet. Rober MinionGave Bryan verbal authorization from yesterday for the Clonazepam.../lmb

## 2016-12-27 MED FILL — DULoxetine HCL 60 MG CPEP: 60 | 30 days supply | Qty: 30 | Fill #0

## 2016-12-28 NOTE — Progress Notes (Signed)
Subjective: 31 year old female presents the office today for concerns of bilateral foot pain. She states that on the right foot hurts more in the arch of the foot as well as for follow-up of the times and on the left foot still the same symptoms as well as a left big toe. [place] that she would like to help to walk more. She does continue to wear her orthotics all the time which to help. She denies any recent injury or trauma. No swelling or redness that she has noticed. Denies any systemic complaints such as fevers, chills, nausea, vomiting. No acute changes since last appointment, and no other complaints at this time.   Objective: AAO x3, NAD DP/PT pulses palpable bilaterally, CRT less than 3 seconds There is a decrease in medial arch height upon weightbearing bilaterally. There is mild tenderness along the medial arch of the foot bilaterally. There is prominence the metatarsal heads plantarly with atrophy of the fat pad. The surgical site is well healed at this point and there is no pain along the surgical site. Equinus is present.  No open lesions or pre-ulcerative lesions.  No pain with calf compression, swelling, warmth, erythema  Assessment: Bilateral foot pain likely due to flatfoot, biomechanical changes  Plan: -All treatment options discussed with the patient including all alternatives, risks, complications.  -Addition further modify her orthotics to help take pressure off a certain areas. Also discussed gentle stretching, rehabilitation exercises. Also discussed supportive shoe. Also discussed that if she does not wear tennis shoe she can wear flip-flops however, have good arch support built into it and she understands this. She is asking for pain medication. I will give her tramadol today. If she continues to need ongoing pain medication she's continued to be seen by pain management. Her surgical site has healed however she has ongoing chronic  foot pain bilaterally. -Patient encouraged to call the office with any questions, concerns, change in symptoms.   Ovid CurdMatthew Amiri Tritch, DPM

## 2016-12-31 MED FILL — ZOLPIDEM TARTRATE 10 MG TAB: 10 | 30 days supply | Qty: 30 | Fill #2

## 2017-01-08 ENCOUNTER — Emergency Department (HOSPITAL_BASED_OUTPATIENT_CLINIC_OR_DEPARTMENT_OTHER): Payer: 59

## 2017-01-08 ENCOUNTER — Emergency Department (HOSPITAL_BASED_OUTPATIENT_CLINIC_OR_DEPARTMENT_OTHER)
Admission: EM | Admit: 2017-01-08 | Discharge: 2017-01-08 | Disposition: A | Discharge status: Home / Self Care | Payer: 59 | Attending: Emergency Medicine | Admitting: Emergency Medicine

## 2017-01-08 ENCOUNTER — Encounter (HOSPITAL_BASED_OUTPATIENT_CLINIC_OR_DEPARTMENT_OTHER): Payer: Self-pay | Admitting: *Deleted

## 2017-01-08 DIAGNOSIS — N1330 Unspecified hydronephrosis: Secondary | ICD-10-CM | POA: Diagnosis not present

## 2017-01-08 DIAGNOSIS — Z79899 Other long term (current) drug therapy: Secondary | ICD-10-CM | POA: Diagnosis not present

## 2017-01-08 DIAGNOSIS — E039 Hypothyroidism, unspecified: Secondary | ICD-10-CM | POA: Diagnosis not present

## 2017-01-08 DIAGNOSIS — R110 Nausea: Secondary | ICD-10-CM | POA: Insufficient documentation

## 2017-01-08 DIAGNOSIS — K56609 Unspecified intestinal obstruction, unspecified as to partial versus complete obstruction: Secondary | ICD-10-CM | POA: Diagnosis not present

## 2017-01-08 DIAGNOSIS — N201 Calculus of ureter: Secondary | ICD-10-CM | POA: Diagnosis not present

## 2017-01-08 DIAGNOSIS — R1030 Lower abdominal pain, unspecified: Secondary | ICD-10-CM | POA: Diagnosis present

## 2017-01-08 LAB — CBC WITH DIFFERENTIAL/PLATELET
Basophils Absolute: 0 10*3/uL (ref 0.0–0.1)
Basophils Relative: 0 %
EOS ABS: 0.1 10*3/uL (ref 0.0–0.7)
EOS PCT: 1 %
HCT: 37.5 % (ref 36.0–46.0)
HEMOGLOBIN: 12.9 g/dL (ref 12.0–15.0)
LYMPHS ABS: 2.5 10*3/uL (ref 0.7–4.0)
LYMPHS PCT: 30 %
MCH: 30.5 pg (ref 26.0–34.0)
MCHC: 34.4 g/dL (ref 30.0–36.0)
MCV: 88.7 fL (ref 78.0–100.0)
MONOS PCT: 7 %
Monocytes Absolute: 0.6 10*3/uL (ref 0.1–1.0)
Neutro Abs: 5.2 10*3/uL (ref 1.7–7.7)
Neutrophils Relative %: 62 %
PLATELETS: 286 10*3/uL (ref 150–400)
RBC: 4.23 MIL/uL (ref 3.87–5.11)
RDW: 13.5 % (ref 11.5–15.5)
WBC: 8.3 10*3/uL (ref 4.0–10.5)

## 2017-01-08 LAB — BASIC METABOLIC PANEL
Anion gap: 8 (ref 5–15)
BUN: 11 mg/dL (ref 6–20)
CHLORIDE: 107 mmol/L (ref 101–111)
CO2: 26 mmol/L (ref 22–32)
CREATININE: 0.61 mg/dL (ref 0.44–1.00)
Calcium: 8.9 mg/dL (ref 8.9–10.3)
GFR calc Af Amer: >60 mL/min (ref >60)
GFR calc non Af Amer: >60 mL/min (ref >60)
Glucose, Bld: 86 mg/dL (ref 65–99)
POTASSIUM: 4 mmol/L (ref 3.5–5.1)
SODIUM: 141 mmol/L (ref 135–145)

## 2017-01-08 LAB — URINALYSIS, ROUTINE W REFLEX MICROSCOPIC
BILIRUBIN URINE: NEGATIVE
GLUCOSE, UA: NEGATIVE mg/dL
HGB URINE DIPSTICK: NEGATIVE
KETONES UR: NEGATIVE mg/dL
LEUKOCYTES UA: NEGATIVE
Nitrite: NEGATIVE
PROTEIN: NEGATIVE mg/dL
Specific Gravity, Urine: 1.017 (ref 1.005–1.030)
pH: 5.5 (ref 5.0–8.0)

## 2017-01-08 LAB — PREGNANCY, URINE: Preg Test, Ur: NEGATIVE

## 2017-01-08 MED ORDER — SODIUM CHLORIDE 0.9 % IV BOLUS (SEPSIS)
1000.0000 mL | Freq: Once | INTRAVENOUS | Status: AC
  Administered 2017-01-08: 1000 mL via INTRAVENOUS

## 2017-01-08 MED ORDER — HYDROMORPHONE HCL 1 MG/ML IJ SOLN
1.0000 mg | Freq: Once | INTRAMUSCULAR | Status: AC
  Administered 2017-01-08: 1 mg via INTRAVENOUS
  Filled 2017-01-08: qty 1

## 2017-01-08 MED ORDER — ONDANSETRON HCL 4 MG/2ML IJ SOLN
4.0000 mg | Freq: Once | INTRAMUSCULAR | Status: AC
  Administered 2017-01-08: 4 mg via INTRAVENOUS
  Filled 2017-01-08: qty 2

## 2017-01-08 MED ORDER — ONDANSETRON HCL 4 MG PO TABS: 4.0000 mg | ORAL_TABLET | Freq: Four times a day (QID) | ORAL | 0 refills | Status: DC

## 2017-01-08 MED ORDER — TAMSULOSIN HCL 0.4 MG PO CAPS: 0.4000 mg | ORAL_CAPSULE | Freq: Every day | ORAL | 0 refills | Status: DC

## 2017-01-08 MED ORDER — OXYCODONE-ACETAMINOPHEN 5-325 MG PO TABS: 1.0000 | ORAL_TABLET | Freq: Four times a day (QID) | ORAL | 0 refills | Status: DC | PRN

## 2017-01-08 NOTE — ED Triage Notes (Signed)
Sudden onset of left flank pain x 3 hrs ago

## 2017-01-08 NOTE — ED Notes (Signed)
ED Provider at bedside. 

## 2017-01-08 NOTE — ED Provider Notes (Signed)
[facility] DEPT Provider Note   CSN: [id number] Arrival date & time: 01/08/17  1615     History   Chief Complaint Chief Complaint  Patient presents with  . Flank Pain    HPI [name] [name] is a 31 y.o. female.  The history is provided by the patient. No language interpreter was used.  Flank Pain     [name] [name] is a 31 y.o. female who presents to the Emergency Department complaining of flank pain.  She reports sudden onset left flank pain that began about 3 hours prior to ED arrival. Pain is a dull pain with worsening waves. She reports associated nausea with dry heaves. She also reports a different smell to her urine. No fevers, vomiting, dysuria, diarrhea, vaginal discharge. She has a history of frequent kidney stones and this feels similar to prior episodes. She also has a history of gastric bypass and is unable to take NSAIDs. She has required lithotripsy in 2011 for a large stone.  Past Medical History:  Diagnosis Date  . Anemia    after 1st delivery  . Anxiety   . DEPRESSION   . GERD (gastroesophageal reflux disease)    pregnancy related- no meds  . Headache(784.0)    hx of migraines  . HYPOTHYROIDISM    hx of, normalized TSH during pregnanacy 2011  . Infertility, female hx   . Morbid obesity ([place])    s/p RY 08/2012 - start weight 290#  . OBESITY   . PCOS (polycystic ovarian syndrome)   . Pregnancy induced hypertension    gestational    Patient Active Problem List   Diagnosis Date Noted  . Pes planus 07/16/2016  . Hallux varus, acquired, right 03/18/2016  . Routine general medical examination at a health care facility 08/13/2015  . Decreased attention Span 05/28/2015  . Sleep disturbance 05/28/2015  . Iron deficiency anemia 02/22/2015  . Kidney stone 08/24/2013  . Obesity (BMI 30-39.9) 04/07/2013  . 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 Sinusitis, acute 07/09/2010  . Anxiety and depression 04/10/2010  . Elevated blood pressure  04/05/2010    Past Surgical History:  Procedure Laterality Date  . CESAREAN SECTION     x2  . 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   Procedure: LAPAROSCOPIC ROUX-EN-Y GASTRIC;  Surgeon: Gayland Curry, MD;  Location: [facility] ORS;  Service: General;  Laterality: N/A;  laparoscopic roux-en-y gastric bypass  . LITHOTRIPSY Left   . TONSILLECTOMY    . TUBAL LIGATION    . UPPER GI ENDOSCOPY  08/24/2012   Procedure: UPPER GI ENDOSCOPY;  Surgeon: Gayland Curry, MD;  Location: [facility] OR[facility];  Service: General;;  . WISDOM TOOTH EXTRACTION      OB History    Gravida Para Term Preterm AB Living   _0 0 0 2   SAB TAB Ectopic Multiple Live Births   0 0 0 0 2       Home Medications    Prior to Admission medications   Medication Sig Start Date End Date Taking? Authorizing Provider  Acetaminophen (TYLENOL PO) Take 500 mg by mouth every 6 (six) hours as needed (pain).     [provider]  acetaminophen-codeine (TYLENOL #3) 300-30 MG tablet Take 1 tablet by mouth every 8 (eight) hours as needed for moderate pain. 09/09/16   Trula Slade, DPM  amoxicillin (AMOXIL) 875 MG tablet Take 1 tablet (875 mg total) by mouth 2 (two) times daily. Patient not taking: Reported on 09/15/2016  08/02/16   Norval Gable, MD  Calcium Citrate-Vitamin D (CALCIUM CITRATE + D PO) Take 1,200 mg by mouth 2 (two) times daily.     [provider]  chlorpheniramine-HYDROcodone (TUSSIONEX PENNKINETIC ER) 10-8 MG/5ML SUER Take 5 mLs by mouth every 12 (twelve) hours as needed. Patient not taking: Reported on 09/15/2016 08/02/16   Norval Gable, MD  clonazePAM (KLONOPIN) 0.5 MG tablet TAKE 1 TABLET BY MOUTH TWICE DAILY AS NEEDED FOR ANXIETY 12/25/16   Golden Circle, FNP  DULoxetine (CYMBALTA) 60 MG capsule TAKE 1 CAPSULE (60 MG TOTAL) BY MOUTH DAILY. 12/25/16   Golden Circle, FNP  ferrous sulfate 325 (65 FE) MG tablet Take 325 mg by mouth daily with breakfast.    [provider]  methylPREDNISolone (MEDROL  DOSEPAK) 4 MG TBPK tablet Take as instructed Patient not taking: Reported on 09/15/2016 08/19/16   Landis Martins, DPM  Multiple Vitamin (MULTI-VITAMIN DAILY PO) Take 1 tablet by mouth 2 (two) times daily.     [provider]  ondansetron (ZOFRAN) 4 MG tablet Take 1 tablet (4 mg total) by mouth every 6 (six) hours. 01/08/17   Quintella Reichert, MD  oxyCODONE-acetaminophen (PERCOCET/ROXICET) 5-325 MG tablet Take 1 tablet by mouth every 6 (six) hours as needed for severe pain. 01/08/17   Quintella Reichert, MD  polyethylene glycol [facility] / Floria Raveling) packet Take 17 g by mouth daily as needed.    [provider]  promethazine (PHENERGAN) 25 MG tablet Take 25 mg by mouth every 6 (six) hours as needed for nausea or vomiting.    [provider]  tamsulosin (FLOMAX) 0.4 MG CAPS capsule Take 1 capsule (0.4 mg total) by mouth daily. 01/08/17   Quintella Reichert, MD  traMADol (ULTRAM) 50 MG tablet Take 1 tablet (50 mg total) by mouth every 8 (eight) hours as needed. 07/16/16   Golden Circle, FNP  traMADol (ULTRAM) 50 MG tablet Take 1 tablet (50 mg total) by mouth 2 (two) times daily. 12/25/16   Trula Slade, DPM  vitamin B-12 (CYANOCOBALAMIN) 1000 MCG tablet Take 5,000 mcg by mouth daily.    [provider]  vitamin C (ASCORBIC ACID) 500 MG tablet Take 500 mg by mouth daily.    [provider]  zolpidem (AMBIEN) 10 MG tablet Take 1 tablet (10 mg total) by mouth at bedtime as needed for sleep. 11/05/16 12/05/16  Golden Circle, FNP    Family History Family History  Problem Relation Age of Onset  . Hyperlipidemia Father   . Hypertension Father   . Kidney disease Father   . Colon cancer Father 42       colon, met to liver  . Hypertension Mother   . Hypertension Maternal Grandmother   . Uterine cancer Maternal Grandmother 76  . Diabetes Maternal Grandfather     Social History Social History  Substance Use Topics  . Smoking status: Never Smoker  . Smokeless  tobacco: Never Used  . Alcohol use No     Comment: rare/socially     Allergies   Patient has no known allergies.   Review of Systems Review of Systems  Genitourinary: Positive for flank pain.  All other systems reviewed and are negative.    Physical Exam Updated Vital Signs BP 119/66   Pulse 84   Temp 98 F (36.7 C)   Resp 18   Ht 5' 4" (1.626 m)   Wt 103 kg (227 lb)   SpO2 100%   BMI 38.96 kg/m  Physical Exam  Constitutional: She is oriented to person, place, and time. She appears well-developed and well-nourished.  Uncomfortable appearing  HENT:  Head: Normocephalic and atraumatic.  Cardiovascular: Normal rate and regular rhythm.   No murmur heard. Pulmonary/Chest: Effort normal and breath sounds normal. No respiratory distress.  Abdominal: Soft. There is no tenderness. There is no rebound and no guarding.  Musculoskeletal: She exhibits no edema or tenderness.  Neurological: She is alert and oriented to person, place, and time.  Skin: Skin is warm and dry.  Psychiatric: She has a normal mood and affect. Her behavior is normal.  Nursing note and vitals reviewed.    ED Treatments / Results  Labs (all labs ordered are listed, but only abnormal results are displayed) Labs Reviewed  URINALYSIS, ROUTINE W REFLEX MICROSCOPIC  PREGNANCY, URINE  BASIC METABOLIC PANEL  CBC WITH DIFFERENTIAL/PLATELET    EKG  EKG Interpretation None       Radiology Dg Abdomen 1 View  Result Date: 01/08/2017 CLINICAL DATA:  Left flank pain EXAM: ABDOMEN - 1 VIEW COMPARISON:  CT 01/02/2015 FINDINGS: Surgical clip in the pelvis. Nonobstructed bowel-gas pattern. Postsurgical changes in the left upper quadrant. No definite radiopaque calculi are seen. IMPRESSION: 1. Nonobstructed bowel-gas pattern 2. No radiopaque calculi are seen Electronically Signed   By: Donavan Foil M.D.   On: 01/08/2017 19:06   US Renal  Result Date: 01/08/2017 CLINICAL DATA:  Sudden on set left flank  pain for 3 hours. EXAM: RENAL / URINARY TRACT ULTRASOUND COMPLETE COMPARISON:  None. FINDINGS: Right Kidney: Length: 13 cm. Echogenicity within normal limits. No mass or hydronephrosis visualized. Left Kidney: Length: 13 cm. Echogenicity within normal limits. There is mild-to-moderate left hydronephrosis. Bladder: The bladder is decompressed limiting evaluation. Evaluation for ureteral jets are limited. IMPRESSION: Mild to moderate left hydronephrosis. Electronically Signed   By: Abelardo Diesel M.D.   On: 01/08/2017 19:04    Procedures Procedures (including critical care time)  Medications Ordered in ED Medications  sodium chloride 0.9 % bolus 1,000 mL (0 mLs Intravenous Stopped 01/08/17 1904)  ondansetron (ZOFRAN) injection 4 mg (4 mg Intravenous Given 01/08/17 1705)  HYDROmorphone (DILAUDID) injection 1 mg (1 mg Intravenous Given 01/08/17 1705)  HYDROmorphone (DILAUDID) injection 1 mg (1 mg Intravenous Given 01/08/17 1909)     Initial Impression / Assessment and Plan / ED Course  I have reviewed the triage vital signs and the nursing notes.  Pertinent labs & imaging results that were available during my care of the patient were reviewed by me and considered in my medical decision making (see chart for details).     Patient here for evaluation of left flank pain, history of recurrent kidney stones, some needing intervention. Her pain is controlled in the emergency department an renal ultrasound is consistent with shifting stone given hydronephrosis. Renal function is within normal limits and no evidence of UTI. Counseled patient on home care for renal colic with importance of urology follow-up. Providing pain medications as well as Flomax. Discussed close return precautions.   CT imaging deferred given patient's history of recurrent stones and multiple prior CT scans in the past.  Final Clinical Impressions(s) / ED Diagnoses   Final diagnoses:  Ureterolithiasis    New  Prescriptions Discharge Medication List as of 01/08/2017  7:19 PM    START taking these medications   Details  ondansetron (ZOFRAN) 4 MG tablet Take 1 tablet (4 mg total) by mouth every 6 (six) hours., Starting Thu 01/08/2017, Print  oxyCODONE-acetaminophen (PERCOCET/ROXICET) 5-325 MG tablet Take 1 tablet by mouth every 6 (six) hours as needed for severe pain., Starting Thu 01/08/2017, Print    tamsulosin (FLOMAX) 0.4 MG CAPS capsule Take 1 capsule (0.4 mg total) by mouth daily., Starting Thu 01/08/2017, Print         Quintella Reichert, MD 01/09/17 [id number]

## 2017-01-10 DIAGNOSIS — N201 Calculus of ureter: Secondary | ICD-10-CM | POA: Diagnosis not present

## 2017-01-17 IMAGING — US US RENAL
1 series · 14 of 24 positions shown · non-contrast
Comparison: CT [DATE], ultrasound [DATE]

CLINICAL DATA: Left flank pain

EXAM:
RENAL / URINARY TRACT ULTRASOUND COMPLETE

[Series 1: us renal · 0.23mm/px · 14 of 24 slices shown]
[im 1/24]
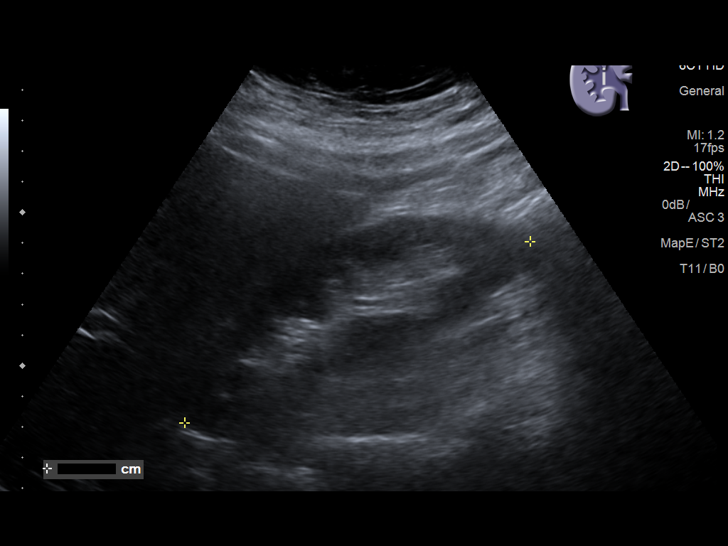
[im 3/24]
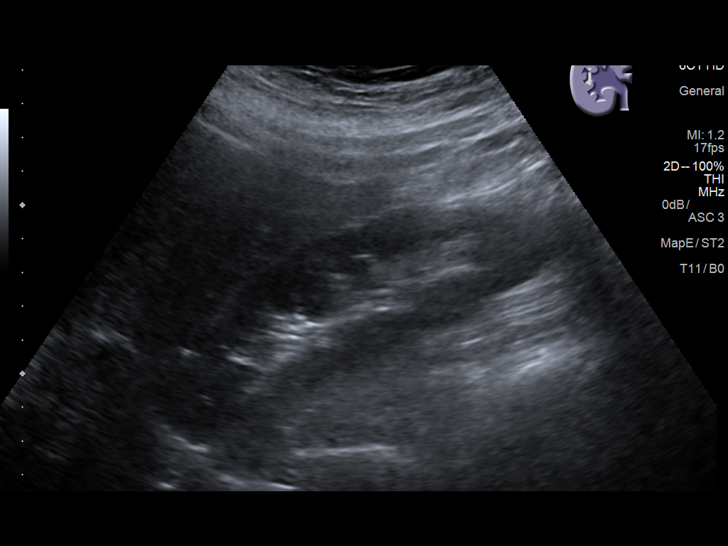
[im 5/24]
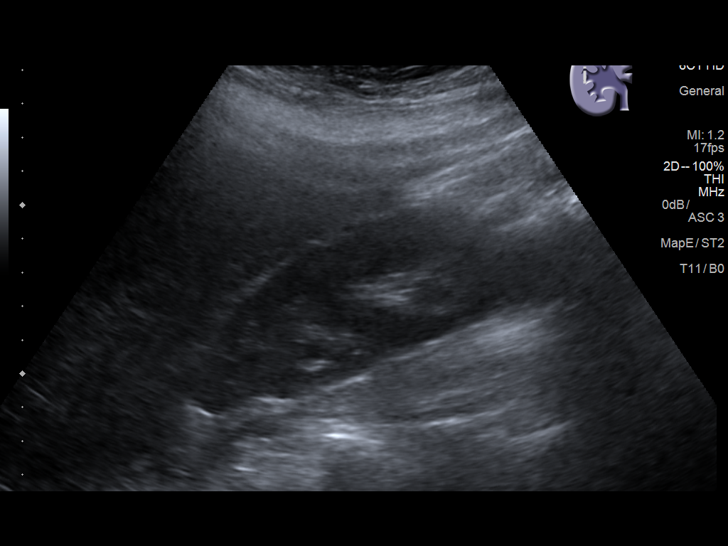
[im 7/24]
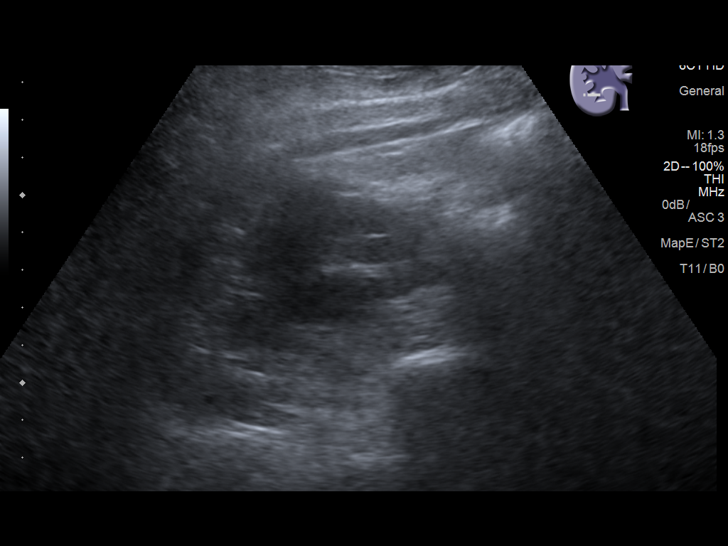
[im 8/24]
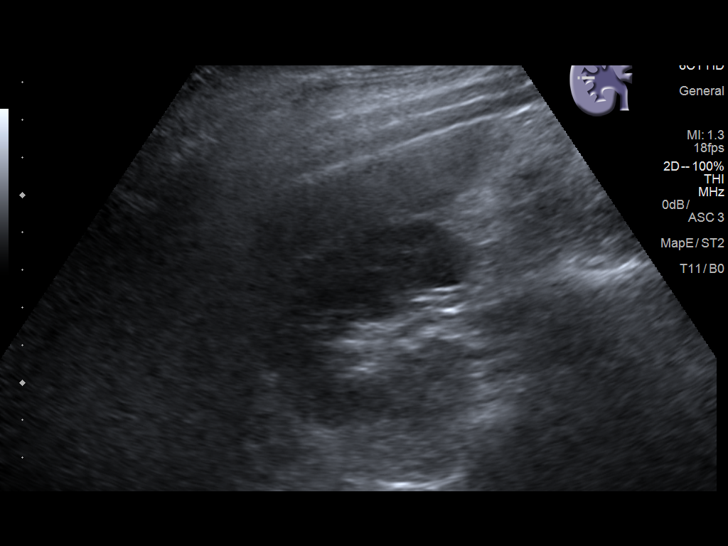
[im 10/24]
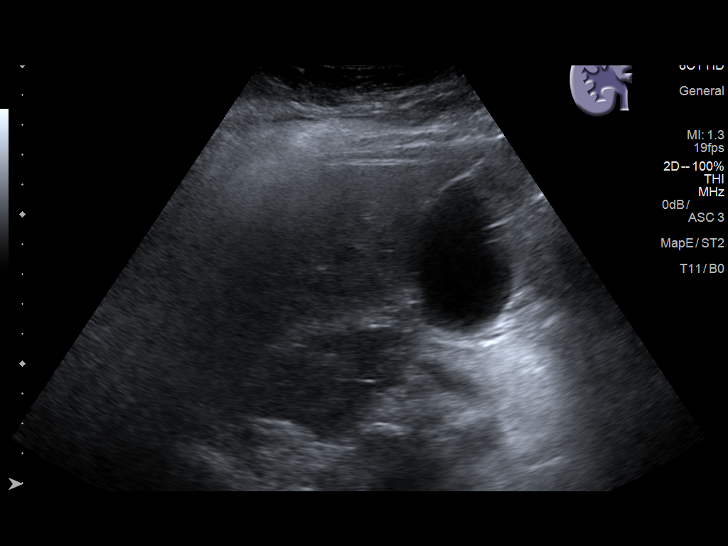
[im 12/24]
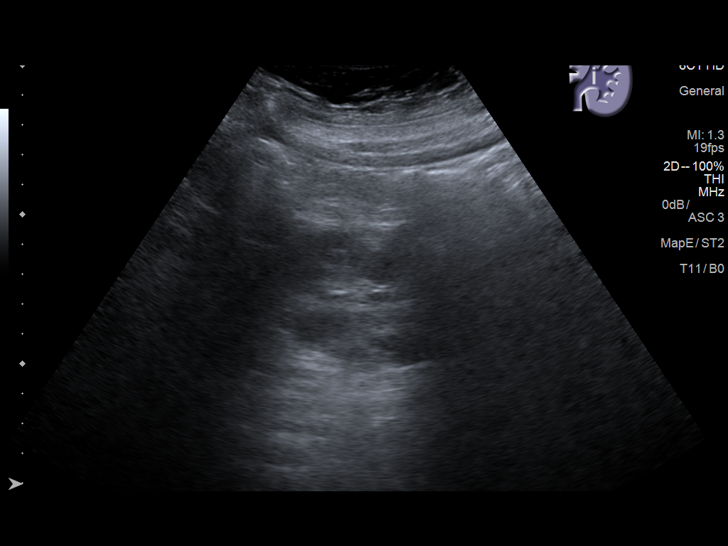
[im 13/24]
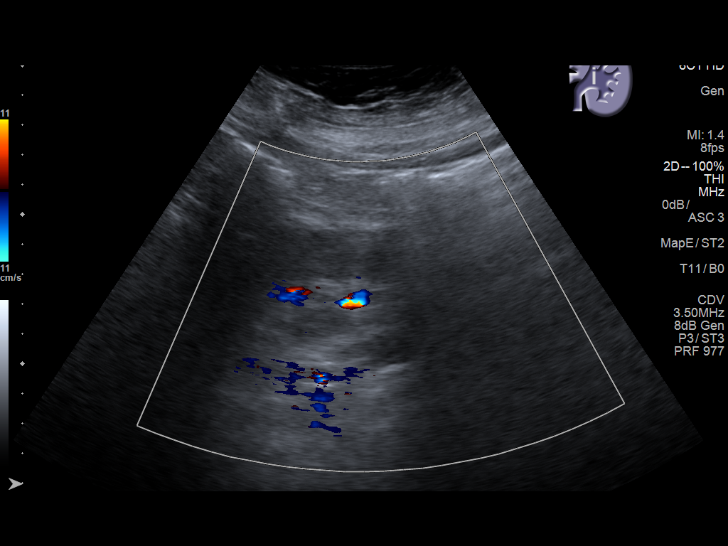
[im 15/24]
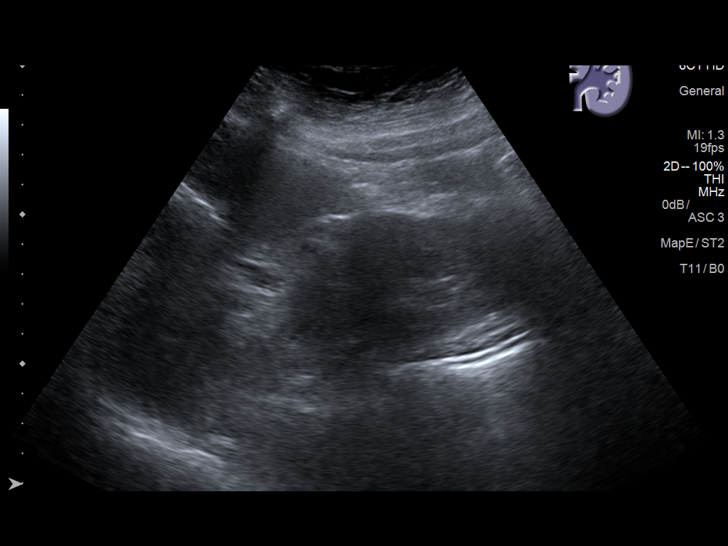
[im 17/24]
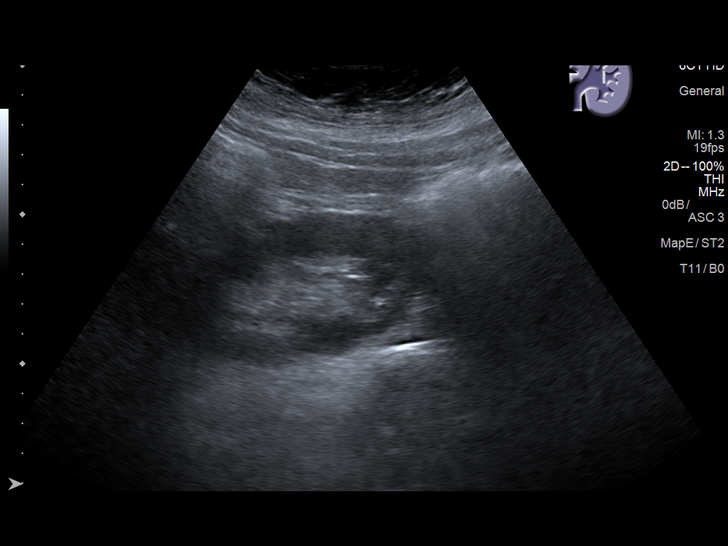
[im 19/24]
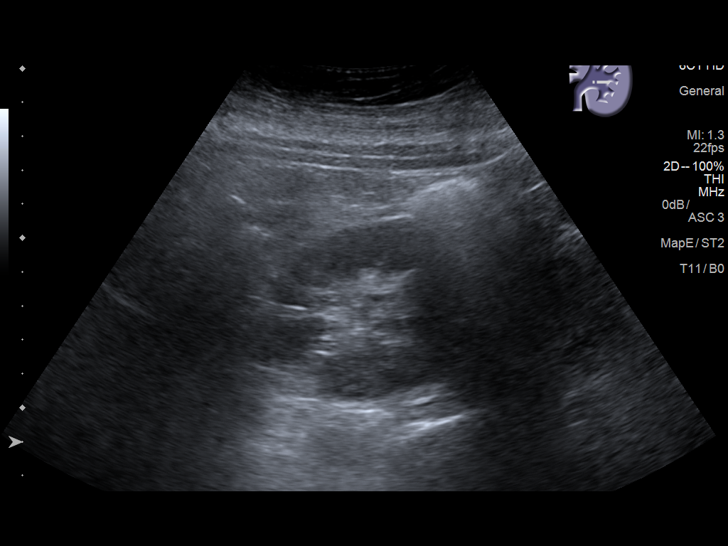
[im 20/24]
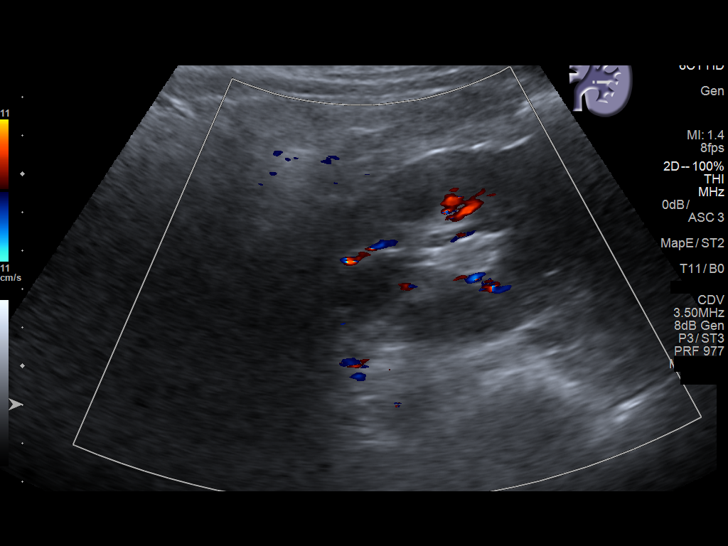
[im 22/24]
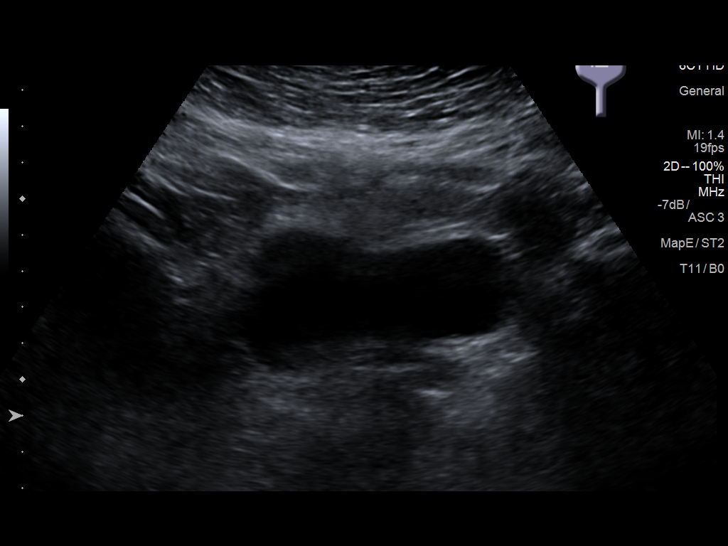
[im 24/24]
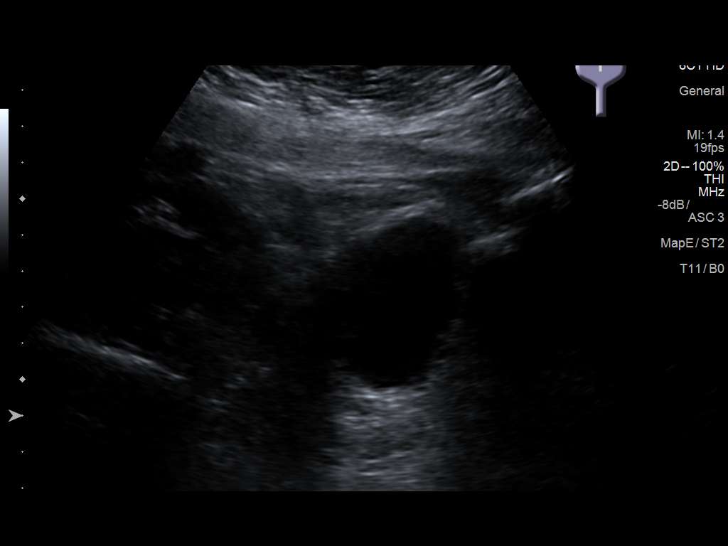

[14 of 24 positions shown; findings below may reference images not displayed]

FINDINGS: Right Kidney:

Length: 12.7 cm. Echogenicity within normal limits. No mass or
hydronephrosis visualized. Possible 3 mm stone in the midpole

Left Kidney:

Length: 12.8 cm. Echogenicity within normal limits. No mass or
hydronephrosis visualized. Prominent left extrarenal pelvis.
Possible punctate 3 mm stone lower pole.

Bladder:

Appears normal for degree of bladder distention.
IMPRESSION: 1. No definite hydronephrosis.  Prominent left extrarenal pelvis.
2. Possible small stones in the kidneys

## 2017-01-21 ENCOUNTER — Encounter: Payer: Self-pay | Admitting: Family

## 2017-01-21 DIAGNOSIS — F329 Major depressive disorder, single episode, unspecified: Secondary | ICD-10-CM

## 2017-01-21 DIAGNOSIS — G479 Sleep disorder, unspecified: Secondary | ICD-10-CM

## 2017-01-21 DIAGNOSIS — F32A Depression, unspecified: Secondary | ICD-10-CM

## 2017-01-21 DIAGNOSIS — F419 Anxiety disorder, unspecified: Secondary | ICD-10-CM

## 2017-01-22 MED ORDER — CLONAZEPAM 0.5 MG PO TABS: 0.5000 mg | ORAL_TABLET | Freq: Two times a day (BID) | ORAL | 1 refills | Status: DC | PRN

## 2017-01-22 MED ORDER — ZOLPIDEM TARTRATE 10 MG PO TABS
10.0000 mg | ORAL_TABLET | Freq: Every evening | ORAL | 2 refills | Status: DC | PRN
Start: 2017-01-22 — End: 2017-04-14

## 2017-01-22 MED FILL — DULoxetine HCL 60 MG CPEP: 60 | 30 days supply | Qty: 30 | Fill #1

## 2017-01-22 MED FILL — clonazePAM 0.5 MG TABS: 0.5 | 30 days supply | Qty: 60 | Fill #0

## 2017-01-23 ENCOUNTER — Emergency Department (HOSPITAL_BASED_OUTPATIENT_CLINIC_OR_DEPARTMENT_OTHER)
Admission: EM | Admit: 2017-01-23 | Discharge: 2017-01-23 | Disposition: A | Discharge status: Home / Self Care | Payer: 59 | Attending: Emergency Medicine | Admitting: Emergency Medicine

## 2017-01-23 ENCOUNTER — Encounter (HOSPITAL_BASED_OUTPATIENT_CLINIC_OR_DEPARTMENT_OTHER): Payer: Self-pay

## 2017-01-23 DIAGNOSIS — R1032 Left lower quadrant pain: Secondary | ICD-10-CM | POA: Diagnosis present

## 2017-01-23 DIAGNOSIS — Z79899 Other long term (current) drug therapy: Secondary | ICD-10-CM | POA: Diagnosis not present

## 2017-01-23 DIAGNOSIS — N200 Calculus of kidney: Secondary | ICD-10-CM | POA: Diagnosis not present

## 2017-01-23 DIAGNOSIS — E039 Hypothyroidism, unspecified: Secondary | ICD-10-CM | POA: Diagnosis not present

## 2017-01-23 HISTORY — DX: Calculus of kidney: N20.0

## 2017-01-23 LAB — COMPREHENSIVE METABOLIC PANEL
ALK PHOS: 117 U/L (ref 38–126)
ALT: 19 U/L (ref 14–54)
AST: 20 U/L (ref 15–41)
Albumin: 4.3 g/dL (ref 3.5–5.0)
Anion gap: 10 (ref 5–15)
BILIRUBIN TOTAL: 0.4 mg/dL (ref 0.3–1.2)
BUN: 14 mg/dL (ref 6–20)
CALCIUM: 8.9 mg/dL (ref 8.9–10.3)
CHLORIDE: 105 mmol/L (ref 101–111)
CO2: 23 mmol/L (ref 22–32)
CREATININE: 0.5 mg/dL (ref 0.44–1.00)
Glucose, Bld: 93 mg/dL (ref 65–99)
Potassium: 3.8 mmol/L (ref 3.5–5.1)
Sodium: 138 mmol/L (ref 135–145)
TOTAL PROTEIN: 7.4 g/dL (ref 6.5–8.1)

## 2017-01-23 LAB — CBC WITH DIFFERENTIAL/PLATELET
BASOS ABS: 0 10*3/uL (ref 0.0–0.1)
BASOS PCT: 0 %
Eosinophils Absolute: 0 10*3/uL (ref 0.0–0.7)
Eosinophils Relative: 0 %
HEMATOCRIT: 39.2 % (ref 36.0–46.0)
HEMOGLOBIN: 13.6 g/dL (ref 12.0–15.0)
Lymphocytes Relative: 21 %
Lymphs Abs: 2 10*3/uL (ref 0.7–4.0)
MCH: 30.7 pg (ref 26.0–34.0)
MCHC: 34.7 g/dL (ref 30.0–36.0)
MCV: 88.5 fL (ref 78.0–100.0)
MONOS PCT: 6 %
Monocytes Absolute: 0.6 10*3/uL (ref 0.1–1.0)
NEUTROS ABS: 7.1 10*3/uL (ref 1.7–7.7)
NEUTROS PCT: 73 %
Platelets: 291 10*3/uL (ref 150–400)
RBC: 4.43 MIL/uL (ref 3.87–5.11)
RDW: 13.9 % (ref 11.5–15.5)
WBC: 9.8 10*3/uL (ref 4.0–10.5)

## 2017-01-23 LAB — URINALYSIS, ROUTINE W REFLEX MICROSCOPIC
BILIRUBIN URINE: NEGATIVE
Glucose, UA: NEGATIVE mg/dL
Hgb urine dipstick: NEGATIVE
KETONES UR: NEGATIVE mg/dL
NITRITE: NEGATIVE
PH: 5 (ref 5.0–8.0)
PROTEIN: NEGATIVE mg/dL
Specific Gravity, Urine: 1.02 (ref 1.005–1.030)

## 2017-01-23 LAB — URINALYSIS, MICROSCOPIC (REFLEX)

## 2017-01-23 MED ORDER — MORPHINE SULFATE (PF) 4 MG/ML IV SOLN
4.0000 mg | Freq: Once | INTRAVENOUS | Status: AC
  Administered 2017-01-23: 4 mg via INTRAVENOUS
  Filled 2017-01-23: qty 1

## 2017-01-23 MED ORDER — SODIUM CHLORIDE 0.9 % IV BOLUS (SEPSIS)
1000.0000 mL | Freq: Once | INTRAVENOUS | Status: AC
  Administered 2017-01-23: 1000 mL via INTRAVENOUS

## 2017-01-23 MED ORDER — ONDANSETRON HCL 4 MG PO TABS: 4.0000 mg | ORAL_TABLET | Freq: Four times a day (QID) | ORAL | 0 refills | Status: DC

## 2017-01-23 MED ORDER — HYDROMORPHONE HCL 1 MG/ML IJ SOLN
1.0000 mg | Freq: Once | INTRAMUSCULAR | Status: AC
  Administered 2017-01-23: 1 mg via INTRAVENOUS
  Filled 2017-01-23: qty 1

## 2017-01-23 MED ORDER — OXYCODONE-ACETAMINOPHEN 5-325 MG PO TABS: 1.0000 | ORAL_TABLET | Freq: Four times a day (QID) | ORAL | 0 refills | Status: DC | PRN

## 2017-01-23 MED ORDER — ONDANSETRON HCL 4 MG/2ML IJ SOLN
4.0000 mg | Freq: Once | INTRAMUSCULAR | Status: AC
  Administered 2017-01-23: 4 mg via INTRAVENOUS
  Filled 2017-01-23: qty 2

## 2017-01-23 NOTE — ED Notes (Signed)
Pt teaching provided on medications that may cause drowsiness. Pt instructed not to drive or operate heavy machinery while taking the prescribed medication. Pt verbalized understanding.   

## 2017-01-23 NOTE — ED Provider Notes (Signed)
[facility] Provider Note   CSN: [id number] Arrival date & time: 01/23/17  1711     History   Chief Complaint Chief Complaint  Patient presents with  . Flank Pain    HPI HONEY ZAKARIAN is a 31 y.o. female.  Pt presents to the ED today with possible kidney stone and left flank pain.  She has a hx of frequent kidney stones.  She was here on 7/27 with the same.  She had an [place] which showed left sided hydronephrosis c/w kidney stone.  Pt said pain went away, but came back today.       Past Medical History:  Diagnosis Date  . Anemia    after 1st delivery  . Anxiety   . DEPRESSION   . GERD (gastroesophageal reflux disease)    pregnancy related- no meds  . Headache(784.0)    hx of migraines  . HYPOTHYROIDISM    hx of, normalized TSH during pregnanacy 2011  . Infertility, female hx   . Morbid obesity ([place])    s/p RY 08/2012 - start weight 290#  . Nephrolithiasis    frequent  . OBESITY   . PCOS (polycystic ovarian syndrome)   . Pregnancy induced hypertension    gestational    Patient Active Problem List   Diagnosis Date Noted  . Pes planus 07/16/2016  . Hallux varus, acquired, right 03/18/2016  . Routine general medical examination at a health care facility 08/13/2015  . Decreased attention Span 05/28/2015  . Sleep disturbance 05/28/2015  . Iron deficiency anemia 02/22/2015  . Kidney stone 08/24/2013  . Obesity (BMI 30-39.9) 04/07/2013  . 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 Sinusitis, acute 07/09/2010  . Anxiety and depression 04/10/2010  . Elevated blood pressure 04/05/2010    Past Surgical History:  Procedure Laterality Date  . CESAREAN SECTION     x2  . FOOT SURGERY    . 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   Procedure: LAPAROSCOPIC ROUX-EN-Y GASTRIC;  Surgeon: Gayland Curry, MD;  Location: [facility];  Service: General;  Laterality: N/A;  laparoscopic roux-en-y gastric bypass  . LITHOTRIPSY Left   . TONSILLECTOMY    . TUBAL  LIGATION    . UPPER GI ENDOSCOPY  08/24/2012   Procedure: UPPER GI ENDOSCOPY;  Surgeon: Gayland Curry, MD;  Location: [facility];  Service: General;;  . WISDOM TOOTH EXTRACTION      OB History    Gravida Para Term Preterm AB Living   2 2 2  0 0 2   SAB TAB Ectopic Multiple Live Births   0 0 0 0 2       Home Medications    Prior to Admission medications   Medication Sig Start Date End Date Taking? Authorizing Provider  Acetaminophen (TYLENOL PO) Take 500 mg by mouth every 6 (six) hours as needed (pain).     [provider]  acetaminophen-codeine (TYLENOL #3) 300-30 MG tablet Take 1 tablet by mouth every 8 (eight) hours as needed for moderate pain. 09/09/16   Trula Slade, DPM  amoxicillin (AMOXIL) 875 MG tablet Take 1 tablet (875 mg total) by mouth 2 (two) times daily. Patient not taking: Reported on 09/15/2016 08/02/16   Norval Gable, MD  Calcium Citrate-Vitamin D (CALCIUM CITRATE + D PO) Take 1,200 mg by mouth 2 (two) times daily.     [provider]  chlorpheniramine-HYDROcodone (TUSSIONEX PENNKINETIC ER) 10-8 MG/5ML SUER Take 5 mLs by mouth every 12 (twelve) hours as needed.  Patient not taking: Reported on 09/15/2016 08/02/16   Norval Gable, MD  clonazePAM (KLONOPIN) 0.5 MG tablet Take 1 tablet (0.5 mg total) by mouth 2 (two) times daily as needed. for anxiety 01/22/17   Golden Circle, FNP  DULoxetine (CYMBALTA) 60 MG capsule TAKE 1 CAPSULE (60 MG TOTAL) BY MOUTH DAILY. 12/25/16   Golden Circle, FNP  ferrous sulfate 325 (65 FE) MG tablet Take 325 mg by mouth daily with breakfast.    [provider]  methylPREDNISolone (MEDROL DOSEPAK) 4 MG TBPK tablet Take as instructed Patient not taking: Reported on 09/15/2016 08/19/16   Landis Martins, DPM  Multiple Vitamin (MULTI-VITAMIN DAILY PO) Take 1 tablet by mouth 2 (two) times daily.     [provider]  ondansetron (ZOFRAN) 4 MG tablet Take 1 tablet (4 mg total) by mouth every 6 (six) hours. 01/23/17    Isla Pence, MD  oxyCODONE-acetaminophen (PERCOCET/ROXICET) 5-325 MG tablet Take 1 tablet by mouth every 6 (six) hours as needed for severe pain. 01/23/17   Isla Pence, MD  polyethylene glycol (MIRALAX / GLYCOLAX) packet Take 17 g by mouth daily as needed.    [provider]  promethazine (PHENERGAN) 25 MG tablet Take 25 mg by mouth every 6 (six) hours as needed for nausea or vomiting.    [provider]  tamsulosin (FLOMAX) 0.4 MG CAPS capsule Take 1 capsule (0.4 mg total) by mouth daily. 01/08/17   Quintella Reichert, MD  traMADol (ULTRAM) 50 MG tablet Take 1 tablet (50 mg total) by mouth every 8 (eight) hours as needed. 07/16/16   Golden Circle, FNP  traMADol (ULTRAM) 50 MG tablet Take 1 tablet (50 mg total) by mouth 2 (two) times daily. 12/25/16   Trula Slade, DPM  vitamin B-12 (CYANOCOBALAMIN) 1000 MCG tablet Take 5,000 mcg by mouth daily.    [provider]  vitamin C (ASCORBIC ACID) 500 MG tablet Take 500 mg by mouth daily.    [provider]  zolpidem (AMBIEN) 10 MG tablet Take 1 tablet (10 mg total) by mouth at bedtime as needed for sleep. 01/22/17 02/21/17  Golden Circle, FNP    Family History Family History  Problem Relation Age of Onset  . Hyperlipidemia Father   . Hypertension Father   . Kidney disease Father   . Colon cancer Father 74       colon, met to liver  . Hypertension Mother   . Hypertension Maternal Grandmother   . Uterine cancer Maternal Grandmother 76  . Diabetes Maternal Grandfather     Social History Social History  Substance Use Topics  . Smoking status: Never Smoker  . Smokeless tobacco: Never Used  . Alcohol use No     Comment: rare/socially     Allergies   Patient has no known allergies.   Review of Systems Review of Systems  Genitourinary: Positive for flank pain.  All other systems reviewed and are negative.    Physical Exam Updated Vital Signs BP 122/72 (BP Location: Right Arm)   Pulse  88   Temp 98.3 F (36.8 C) (Oral)   Resp 18   Ht 5' 4"  (1.626 m)   Wt 103 kg (227 lb)   SpO2 100%   BMI 38.96 kg/m   Physical Exam  Constitutional: She is oriented to person, place, and time. She appears well-developed and well-nourished.  HENT:  Head: Normocephalic and atraumatic.  Right Ear: External ear normal.  Left Ear: External ear normal.  Nose:  Nose normal.  Mouth/Throat: Oropharynx is clear and moist.  Eyes: Pupils are equal, round, and reactive to light. Conjunctivae and EOM are normal.  Neck: Normal range of motion. Neck supple.  Cardiovascular: Normal rate, regular rhythm, normal heart sounds and intact distal pulses.   Pulmonary/Chest: Effort normal and breath sounds normal.  Abdominal: Soft. Bowel sounds are normal.  Musculoskeletal: Normal range of motion.  Neurological: She is alert and oriented to person, place, and time.  Skin: Skin is warm. Capillary refill takes less than 2 seconds.  Psychiatric: She has a normal mood and affect. Her behavior is normal. Judgment and thought content normal.  Nursing note and vitals reviewed.    ED Treatments / Results  Labs (all labs ordered are listed, but only abnormal results are displayed) Labs Reviewed  URINALYSIS, ROUTINE W REFLEX MICROSCOPIC - Abnormal; Notable for the following:       Result Value   Leukocytes, UA SMALL (*)    All other components within normal limits  URINALYSIS, MICROSCOPIC (REFLEX) - Abnormal; Notable for the following:    Bacteria, UA RARE (*)    Squamous Epithelial / LPF 0-5 (*)    All other components within normal limits  COMPREHENSIVE METABOLIC PANEL  CBC WITH DIFFERENTIAL/PLATELET    EKG  EKG Interpretation None       Radiology No results found.  Procedures Procedures (including critical care time)  Medications Ordered in ED Medications  sodium chloride 0.9 % bolus 1,000 mL (1,000 mLs Intravenous New Bag/Given 01/23/17 1930)  ondansetron (ZOFRAN) injection 4 mg (4 mg  Intravenous Given 01/23/17 1932)  morphine 4 MG/ML injection 4 mg (4 mg Intravenous Given 01/23/17 1932)  HYDROmorphone (DILAUDID) injection 1 mg (1 mg Intravenous Given 01/23/17 2036)     Initial Impression / Assessment and Plan / ED Course  I have reviewed the triage vital signs and the nursing notes.  Pertinent labs & imaging results that were available during my care of the patient were reviewed by me and considered in my medical decision making (see chart for details).    Pt has a hx of kidney stone with sx c/w kidney stone pain.  Her pain is better after treatment, so I did not re scan her.  She knows to f/u with urology and return if worse.  Final Clinical Impressions(s) / ED Diagnoses   Final diagnoses:  Kidney stone    New Prescriptions Current Discharge Medication List       Isla Pence, MD 01/23/17 2147

## 2017-01-23 NOTE — ED Triage Notes (Signed)
Pt c/o L flank pain that radiates around to the front. Pt has a hx of frequent kidney stones. Pt denies fever or urinary symptoms.

## 2017-01-27 DIAGNOSIS — N202 Calculus of kidney with calculus of ureter: Secondary | ICD-10-CM | POA: Diagnosis not present

## 2017-01-27 DIAGNOSIS — R109 Unspecified abdominal pain: Secondary | ICD-10-CM | POA: Diagnosis not present

## 2017-01-30 MED FILL — ZOLPIDEM TARTRATE 10 MG TAB: 10 | 30 days supply | Qty: 30 | Fill #0

## 2017-02-05 ENCOUNTER — Ambulatory Visit (INDEPENDENT_AMBULATORY_CARE_PROVIDER_SITE_OTHER): Payer: 59 | Admitting: Podiatry

## 2017-02-05 ENCOUNTER — Encounter: Payer: Self-pay | Admitting: Podiatry

## 2017-02-05 DIAGNOSIS — M779 Enthesopathy, unspecified: Secondary | ICD-10-CM | POA: Diagnosis not present

## 2017-02-05 DIAGNOSIS — M2012 Hallux valgus (acquired), left foot: Secondary | ICD-10-CM | POA: Diagnosis not present

## 2017-02-06 NOTE — Progress Notes (Signed)
Subjective: [name] [name] presents the office today for concerns of pain to the bunion the left foot. She says the right foot is doing very well and she was able to get a last vagus and dual lateral walking without any pain to the right foot and she is very pleased the outcome the surgery at this point. At this point she states that the left foot is starting to become symptomatic and should to go ahead and proceed with surgery at some point in the next couple months for the left foot. She has tried shoe changes, offloading of having the ascending improvement. Denies any systemic complaints such as fevers, chills, nausea, vomiting. No acute changes since last appointment, and no other complaints at this time.   Objective: AAO x3, NAD DP/PT pulses palpable bilaterally, CRT less than 3 seconds Incision on the surgical right foot is well-healed and there is no pain on the bunion site appears no pain or restriction with MPJ range of motion. On the left foot there is moderate HAV present. There is no pain or crepitation first MPJ range of motion is no first ray hypermobility present. There is tenderness on the bunion site left foot. No other areas of tenderness.  No open lesions or pre-ulcerative lesions.  No pain with calf compression, swelling, warmth, erythema  Assessment: Capsulitis left first metatarsophalangeal joint, HAV  Plan: -All treatment options discussed with the patient including all alternatives, risks, complications.  -Discussed with her both conservative and surgical treatment options. Discussed in the left foot we can do an [place] type bunionectomy. She does not want to undergo a Lapidus and also think given the deformity is more mildly can do an [place] and she can still get good results with this. Discussed this is not a guarantee and has a higher rate of recurrence but I think she'll do well from this. She'll like to do this later on this year. At this point given her pain should proceed  with a steroid injection. Under sterile conditions a mixture Kenalog and local anesthetic was infiltrated into the first MPJ and the left foot without competitions. Postinjection care with discussed. For now continue supportive shoe gear and orthotics. -Patient encouraged to call the office with any questions, concerns, change in symptoms.   [name], DPM

## 2017-02-11 ENCOUNTER — Telehealth: Payer: Self-pay | Admitting: *Deleted

## 2017-02-11 NOTE — Telephone Encounter (Signed)
"  Dr. Ardelle Anton and I need to schedule another bunionectomy but on the left foot.  He told me to give you a call when I got my dates from work.  If you could give me a call back.   I'm looking at October 3 as the best date for my work schedule."

## 2017-02-12 NOTE — Telephone Encounter (Signed)
I left her a message that the date she requested is available. I informed her that she will need to call and schedule a consult appointment with Dr. Ardelle AntonWagoner.

## 2017-02-18 DIAGNOSIS — E669 Obesity, unspecified: Secondary | ICD-10-CM | POA: Diagnosis not present

## 2017-02-18 DIAGNOSIS — Z9884 Bariatric surgery status: Secondary | ICD-10-CM | POA: Diagnosis not present

## 2017-02-18 DIAGNOSIS — D509 Iron deficiency anemia, unspecified: Secondary | ICD-10-CM | POA: Diagnosis not present

## 2017-02-18 DIAGNOSIS — F329 Major depressive disorder, single episode, unspecified: Secondary | ICD-10-CM | POA: Diagnosis not present

## 2017-02-18 DIAGNOSIS — K912 Postsurgical malabsorption, not elsewhere classified: Secondary | ICD-10-CM | POA: Diagnosis not present

## 2017-02-23 MED FILL — clonazePAM 0.5 MG TABS: 0.5 | 30 days supply | Qty: 60 | Fill #1

## 2017-02-24 ENCOUNTER — Other Ambulatory Visit: Payer: Self-pay | Admitting: Family

## 2017-02-24 MED FILL — DULoxetine HCL 60 MG CPEP: 60 | 30 days supply | Qty: 30 | Fill #0

## 2017-02-27 MED FILL — ZOLPIDEM TARTRATE 10 MG TAB: 10 | 30 days supply | Qty: 30 | Fill #1

## 2017-03-02 ENCOUNTER — Encounter: Payer: Self-pay | Admitting: Podiatry

## 2017-03-02 ENCOUNTER — Ambulatory Visit (INDEPENDENT_AMBULATORY_CARE_PROVIDER_SITE_OTHER): Payer: 59 | Admitting: Podiatry

## 2017-03-02 DIAGNOSIS — M2012 Hallux valgus (acquired), left foot: Secondary | ICD-10-CM

## 2017-03-02 NOTE — Patient Instructions (Signed)
Pre-Operative Instructions  Congratulations, you have decided to take an important step towards improving your quality of life.  You can be assured that the doctors and staff at [facility] will be with you every step of the way.  Here are some important things you should know:  1. Plan to be at the surgery center/hospital at least 1 (one) hour prior to your scheduled time, unless otherwise directed by the surgical center/hospital staff.  You must have a responsible adult accompany you, remain during the surgery and drive you home.  Make sure you have directions to the surgical center/hospital to ensure you arrive on time. 2. If you are having surgery at [facility] or [facility] hospitals, you will need a copy of your medical history and physical form from your family physician within one month prior to the date of surgery. We will give you a form for your primary physician to complete.  3. We make every effort to accommodate the date you request for surgery.  However, there are times where surgery dates or times have to be moved.  We will contact you as soon as possible if a change in schedule is required.   4. No aspirin/ibuprofen for one week before surgery.  If you are on aspirin, any non-steroidal anti-inflammatory medications (Mobic, Aleve, Ibuprofen) should not be taken seven (7) days prior to your surgery.  You make take Tylenol for pain prior to surgery.  5. Medications - If you are taking daily heart and blood pressure medications, seizure, reflux, allergy, asthma, anxiety, pain or diabetes medications, make sure you notify the surgery center/hospital before the day of surgery so they can tell you which medications you should take or avoid the day of surgery. 6. No food or drink after midnight the night before surgery unless directed otherwise by surgical center/hospital staff. 7. No alcoholic beverages 24-hours prior to surgery.  No smoking 24-hours prior or 24-hours after  surgery. 8. Wear loose pants or shorts. They should be loose enough to fit over bandages, boots, and casts. 9. Don't wear slip-on shoes. Sneakers are preferred. 10. Bring your boot with you to the surgery center/hospital.  Also bring crutches or a walker if your physician has prescribed it for you.  If you do not have this equipment, it will be provided for you after surgery. 11. If you have not been contacted by the surgery center/hospital by the day before your surgery, call to confirm the date and time of your surgery. 12. Leave-time from work may vary depending on the type of surgery you have.  Appropriate arrangements should be made prior to surgery with your employer. 13. Prescriptions will be provided immediately following surgery by your doctor.  Fill these as soon as possible after surgery and take the medication as directed. Pain medications will not be refilled on weekends and must be approved by the doctor. 14. Remove nail polish on the operative foot and avoid getting pedicures prior to surgery. 15. Wash the night before surgery.  The night before surgery wash the foot and leg well with water and the antibacterial soap provided. Be sure to pay special attention to beneath the toenails and in between the toes.  Wash for at least three (3) minutes. Rinse thoroughly with water and dry well with a towel.  Perform this wash unless told not to do so by your physician.  Enclosed: 1 Ice pack (please put in freezer the night before surgery)   1 Hibiclens skin cleaner     Pre-op instructions  If you have any questions regarding the instructions, please do not hesitate to call our office.  [place]: [address], [place], [place] [address] -- [phone]  [place]: [address] [place], [place] [address] -- [phone]  [place]: [address]  [place], [place] [address] -- [phone]  [place]: [address], [address], [place], [place] [address] -- [phone]  Website:  [email or web address] 

## 2017-03-03 NOTE — Progress Notes (Signed)
Subjective: [name] [name] presents the office today for surgical consultation for left foot bunion. She states this foot is been painful and she wants to pursue a surgical intervention. She has tried multiple conservative treatments including shoe changes, offloading padding with a significant improvement. The injection did seem to help but the pain is starting to come back. She went to have surgery at the beginning of October possible. Denies any systemic complaints such as fevers, chills, nausea, vomiting. No acute changes since last appointment, and no other complaints at this time.   Objective: AAO x3, NAD DP/PT pulses palpable bilaterally, CRT less than 3 seconds Clinically mild HAV is present. There is no pain or crepitation with first MTPJ range of motion. Present first ray have been thoroughly present. The hallux appears to be started overlapping second toe. There is tenderness on the bunion site. There is no other areas of tenderness identified at this time. Incision from the prior surgery of the right foot is well-healed and there is no pain on surgical site. No open lesions or pre-ulcerative lesions.  No pain with calf compression, swelling, warmth, erythema  Assessment: Left foot symptomatic HAV  Plan: -All treatment options discussed with the patient including all alternatives, risks, complications.  -X-rays from July were reviewed with the patient. Moderate HAV is present. -Discussed surgical intervention. She does not want to undergo a [place]us bunion to me on the side. I believe that we can get good correction with an Eliberto Ivory and possible [place] bunionectomy. I discussed the surgery as well as the postoperative course of the surgery and she understands and she wishes to proceed with surgery. -The incision placement as well as the postoperative course was discussed with the patient. I discussed risks of the surgery which include, but not limited to, infection, bleeding, pain, swelling, need  for further surgery, delayed or nonhealing, painful or ugly scar, numbness or sensation changes, over/under correction, recurrence, transfer lesions, further deformity, hardware failure, DVT/PE, loss of toe/foot. Patient understands these risks and wishes to proceed with surgery. The surgical consent was reviewed with the patient all 3 pages were signed. No promises or guarantees were given to the outcome of the procedure. All questions were answered to the best of my ability. Before the surgery the patient was encouraged to call the office if there is any further questions. The surgery will be performed at the [facility] on an outpatient basis. -CAM boot dispensed for postop.  -Patient encouraged to call the office with any questions, concerns, change in symptoms.   *CLINDAMYCIN POSTOP  [name], DPM

## 2017-03-10 ENCOUNTER — Emergency Department
Admission: EM | Admit: 2017-03-10 | Discharge: 2017-03-10 | Disposition: A | Discharge status: Home / Self Care | Payer: 59 | Source: Home / Self Care | Attending: Family Medicine | Admitting: Family Medicine

## 2017-03-10 ENCOUNTER — Telehealth: Payer: 59 | Admitting: Family

## 2017-03-10 ENCOUNTER — Encounter: Payer: Self-pay | Admitting: *Deleted

## 2017-03-10 DIAGNOSIS — R51 Headache: Secondary | ICD-10-CM

## 2017-03-10 DIAGNOSIS — R509 Fever, unspecified: Secondary | ICD-10-CM

## 2017-03-10 DIAGNOSIS — R519 Headache, unspecified: Secondary | ICD-10-CM

## 2017-03-10 LAB — POCT CBC W AUTO DIFF (K'VILLE URGENT CARE)

## 2017-03-10 MED ORDER — ONDANSETRON 4 MG PO TBDP: ORAL_TABLET | ORAL | 0 refills | Status: DC

## 2017-03-10 MED ORDER — ONDANSETRON 4 MG PO TBDP
4.0000 mg | ORAL_TABLET | Freq: Once | ORAL | Status: AC
  Administered 2017-03-10: 4 mg via ORAL

## 2017-03-10 NOTE — ED Provider Notes (Signed)
[facility]    CSN: [id number] Arrival date & time: 03/10/17  1428     History   Chief Complaint Chief Complaint  Patient presents with  . Headache  . Nausea  . Fever    HPI KRYSTELLE PRASHAD is a 31 y.o. female.   HPI  MONETTA LICK is a 31 y.o. female presenting to UC with c/o generalized headache for 2 days, fever Tmax 102.5*F and painful itchy rash on back of her neck she believes is from ice pack she has been using for her headache.  Fever has seemed to resolved today but she has been taking 548m acetaminophen every 5 hours. Due to gastric bypass, she cannot take ibuprofen.  Denies vomiting or diarrhea. 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 Denies concern for tick bites. No recent travel.    Past Medical History:  Diagnosis Date  . Anemia    after 1st delivery  . Anxiety   . DEPRESSION   . GERD (gastroesophageal reflux disease)    pregnancy related- no meds  . Headache(784.0)    hx of migraines  . HYPOTHYROIDISM    hx of, normalized TSH during pregnanacy 2011  . Infertility, female hx   . Morbid obesity ([place]    s/p RY 08/2012 - start weight 290#  . Nephrolithiasis    frequent  . OBESITY   . PCOS (polycystic ovarian syndrome)   . Pregnancy induced hypertension    gestational    Patient Active Problem List   Diagnosis Date Noted  . 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 Pes planus 07/16/2016  . Hallux varus, acquired, right 03/18/2016  . Routine general medical examination at a health care facility 08/13/2015  . Decreased attention Span 05/28/2015  . Sleep disturbance 05/28/2015  . Iron deficiency anemia 02/22/2015  . Kidney stone 08/24/2013  . Obesity (BMI 30-39.9) 04/07/2013  . 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 Sinusitis, acute 07/09/2010  . Anxiety and depression 04/10/2010  . Elevated blood pressure 04/05/2010    Past Surgical History:  Procedure Laterality  Date  . CESAREAN SECTION     x2  . FOOT SURGERY    . 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   Procedure: LAPAROSCOPIC ROUX-EN-Y GASTRIC;  Surgeon: EGayland Curry MD;  Location: [facility];  Service: General;  Laterality: N/A;  laparoscopic roux-en-y gastric bypass  . LITHOTRIPSY Left   . TONSILLECTOMY    . TUBAL LIGATION    . UPPER GI ENDOSCOPY  08/24/2012   Procedure: UPPER GI ENDOSCOPY;  Surgeon: EGayland Curry MD;  Location: [facility];  Service: General;;  . WISDOM TOOTH EXTRACTION      OB History    Gravida Para Term Preterm AB Living   _0 0 0 2   SAB TAB Ectopic Multiple Live Births   0 0 0 0 2       Home Medications    Prior to Admission medications   Medication Sig Start Date End Date Taking? Authorizing Provider  clonazePAM (KLONOPIN) 0.5 MG tablet Take 1 tablet (0.5 mg total) by mouth 2 (two) times daily as needed. for anxiety 01/22/17  Yes CGolden Circle FNP  DULoxetine (CYMBALTA) 60 MG capsule TAKE 1 CAPSULE BY MOUTH ONCE DAILY 02/24/17  Yes CGolden Circle FNP  zolpidem (AMBIEN) 10 MG tablet Take 1 tablet (10 mg total) by mouth at bedtime as needed for sleep. 01/22/17 03/10/17 Yes Calone,  Ples Specter, FNP  Acetaminophen (TYLENOL PO) Take 500 mg by mouth every 6 (six) hours as needed (pain).     [provider]  Calcium Citrate-Vitamin D (CALCIUM CITRATE + D PO) Take 1,200 mg by mouth 2 (two) times daily.     [provider]  ferrous sulfate 325 (65 FE) MG tablet Take 325 mg by mouth daily with breakfast.    [provider]  methylPREDNISolone (MEDROL DOSEPAK) 4 MG TBPK tablet Take as instructed Patient not taking: Reported on 09/15/2016 08/19/16   Landis Martins, DPM  Multiple Vitamin (MULTI-VITAMIN DAILY PO) Take 1 tablet by mouth 2 (two) times daily.     [provider]  ondansetron (ZOFRAN ODT) 4 MG disintegrating tablet 30m ODT q4 hours prn nausea/vomit 03/10/17   Lawson Mahone O, PA-C  polyethylene glycol (MIRALAX / GLYCOLAX) packet Take 17 g by  mouth daily as needed.    [provider]  promethazine (PHENERGAN) 25 MG tablet Take 25 mg by mouth every 6 (six) hours as needed for nausea or vomiting.    [provider]  tamsulosin (FLOMAX) 0.4 MG CAPS capsule Take 1 capsule (0.4 mg total) by mouth daily. 01/08/17   RQuintella Reichert MD  traMADol (ULTRAM) 50 MG tablet Take 1 tablet (50 mg total) by mouth 2 (two) times daily. 12/25/16   WTrula Slade DPM  vitamin B-12 (CYANOCOBALAMIN) 1000 MCG tablet Take 5,000 mcg by mouth daily.    [provider]  vitamin C (ASCORBIC ACID) 500 MG tablet Take 500 mg by mouth daily.    [provider]    Family History Family History  Problem Relation Age of Onset  . Hyperlipidemia Father   . Hypertension Father   . Kidney disease Father   . Colon cancer Father 538      colon, met to liver  . Hypertension Mother   . Hypertension Maternal Grandmother   . Uterine cancer Maternal Grandmother 76  . Diabetes Maternal Grandfather     Social History Social History  Substance Use Topics  . Smoking status: Never Smoker  . Smokeless tobacco: Never Used  . Alcohol use No     Comment: rare/socially     Allergies   Patient has no known allergies.   Review of Systems Review of Systems  Constitutional: Positive for fatigue and fever. Negative for chills.  HENT: Negative for congestion, ear pain, sore throat, trouble swallowing and voice change.   Respiratory: Negative for cough and shortness of breath.   Cardiovascular: Negative for chest pain and palpitations.  Gastrointestinal: Positive for nausea. Negative for abdominal pain, diarrhea and vomiting.  Musculoskeletal: Negative for arthralgias, back pain and myalgias.  Skin: Positive for rash.  Neurological: Positive for headaches. Negative for dizziness and light-headedness.     Physical Exam Triage Vital Signs ED Triage Vitals  Enc Vitals Group     BP      Pulse      Resp      Temp      Temp src       SpO2      Weight      Height      Head Circumference      Peak Flow      Pain Score      Pain Loc      Pain Edu?      Excl[place]    No data found.   Updated Vital Signs BP 112/78 (BP Location: Left  Arm)   Pulse 92   Temp 98.9 F (37.2 C) (Oral)   Resp 14   Ht _0  (1.626 m)   Wt 236 lb (107 kg)   LMP 02/27/2017   SpO2 98%   BMI 40.51 kg/m   Visual Acuity Right Eye Distance:   Left Eye Distance:   Bilateral Distance:    Right Eye Near:   Left Eye Near:    Bilateral Near:     Physical Exam  Constitutional: She is oriented to person, place, and time. She appears well-developed and well-nourished. No distress.  HENT:  Head: Normocephalic and atraumatic.  Right Ear: Tympanic membrane normal.  Left Ear: Tympanic membrane normal.  Nose: Nose normal.  Mouth/Throat: Uvula is midline, oropharynx is clear and moist and mucous membranes are normal.  Eyes: EOM are normal.  Neck: Normal range of motion. Neck supple.  No nuchal rigidity or meningeal signs.  Cardiovascular: Normal rate and regular rhythm.   Pulmonary/Chest: Effort normal and breath sounds normal. No stridor. No respiratory distress. She has no wheezes. She has no rales.  Musculoskeletal: Normal range of motion.  Lymphadenopathy:    She has no cervical adenopathy.  Neurological: She is alert and oriented to person, place, and time. No cranial nerve deficit.  CN II-XII in tact. Speech is clear, alert to person, place, and time. Normal gait   Skin: Skin is warm and dry. Rash noted. She is not diaphoretic. There is erythema.  Posterior neck: 2x2cm area of erythema. Mildly tender. Does blanch. No induration or fluctuance. No bleeding or drainage.   Psychiatric: She has a normal mood and affect. Her behavior is normal.  Nursing note and vitals reviewed.    UC Treatments / Results  Labs (all labs ordered are listed, but only abnormal results are displayed) Labs Reviewed  POCT CBC W AUTO DIFF ([place])    EKG  EKG Interpretation None       Radiology No results found.  Procedures Procedures (including critical care time)  Medications Ordered in UC Medications  ondansetron (ZOFRAN-ODT) disintegrating tablet 4 mg (4 mg Oral Given 03/10/17 1529)     Initial Impression / Assessment and Plan / UC Course  I have reviewed the triage vital signs and the nursing notes.  Pertinent labs & imaging results that were available during my care of the patient were reviewed by me and considered in my medical decision making (see chart for details).     Hx and exam most c/w viral illness. Due to rash on back of neck, discussed sending tick illness labs but pt declined stating she is never anywhere a tick would be.  CBC: WNL  Encouraged fluids, rest, continue acetaminophen for fever or pain. zofran prescribed for nausea F/u with PCP in 3-4 days if not improving  Discussed symptoms that warrant emergent care in the ED.   Final Clinical Impressions(s) / UC Diagnoses   Final diagnoses:  Bad headache  Fever in adult    New Prescriptions Discharge Medication List as of 03/10/2017  3:32 PM    START taking these medications   Details  ondansetron (ZOFRAN ODT) 4 MG disintegrating tablet 55m ODT q4 hours prn nausea/vomit, Print         Controlled Substance Prescriptions [place] Controlled Substance Registry consulted? Not Applicable   PTyrell Antonio09/25/18 1824

## 2017-03-10 NOTE — ED Triage Notes (Signed)
Patient c/o 2 days of HA, nausea, fever with t-max 102.5 and a rash on the back of her neck she believes may be from her ice pack.

## 2017-03-10 NOTE — Progress Notes (Signed)
Based on what you shared with me it looks like you have a serious condition that should be evaluated in a face to face office visit.  NOTE: Even if you have entered your credit card information for this eVisit, you will not be charged.   If you are having a true medical emergency please call 911.  If you need an urgent face to face visit, [facility] has four urgent care centers for your convenience.  If you need care fast and have a high deductible or no insurance consider:   [email or web address]  [phone]  [address], [address] [place], [place] [address] 8 am to 8 pm Monday-Friday 10 am to 4 pm Saturday-Sunday   The following sites will take your  insurance:    . [facility]  [phone] Get Driving Directions Find a Provider at this Location  [address] [place], [place] [address] . 10 am to 8 pm Monday-Friday . 12 pm to 8 pm Saturday-Sunday   . [facility]  [phone] Get Driving Directions Find a Provider at this Location  [address], [address] [place], [place] [address] . 8 am to 8 pm Monday-Friday . 9 am to 6 pm Saturday . 11 am to 6 pm Sunday   .  [facility]gent [facility] [place]  [phone] Get Driving Directions  [address] [place], [place] [address] . 8 am to 8 pm Monday-Friday . 8 am to 4 pm Saturday-Sunday   Your e-visit answers were reviewed by a board certified advanced clinical practitioner to complete your personal care plan.  Thank you for using e-Visits.  

## 2017-03-11 ENCOUNTER — Telehealth: Payer: Self-pay | Admitting: *Deleted

## 2017-03-11 ENCOUNTER — Telehealth: Payer: Self-pay

## 2017-03-11 NOTE — Telephone Encounter (Signed)
We could try for [facility]

## 2017-03-11 NOTE — Telephone Encounter (Signed)
"  I just received a call from the surgical center.  They said I was going to have to pay $750.00 from a previous bill from the surgery I had last year plus my $250.00 deductible.  I do not have that.  So they said I would have to pay that up front or cancel the surgery."  Okay so are you canceling the surgery?  "I guess so unless Dr. Ardelle Anton can call over there and see what he can do."  Dr. Ardelle Anton does not have any control over that.  If he tells me anything different, I'll call you and let you know."

## 2017-03-11 NOTE — Telephone Encounter (Signed)
Left VM to call UC with any questions or concerns.  Contact information provided.

## 2017-03-18 ENCOUNTER — Encounter: Payer: Self-pay | Admitting: Family

## 2017-03-18 NOTE — Telephone Encounter (Signed)
I called patient and informed her we could do it at [facility].  She stated her boss was getting ready to have a surgery, so she said she would wait until she gets back.

## 2017-03-19 ENCOUNTER — Telehealth: Payer: Self-pay

## 2017-03-19 NOTE — Telephone Encounter (Signed)
Patient is wondering if you would send in a sufficient amount of refills on her medications until she can establish with another provider. She is aware that you will not be back until Monday and she did not need any medications at the moment. I let her know I would send a note for you to answer when you get back.

## 2017-03-23 ENCOUNTER — Encounter: Payer: 59 | Admitting: Podiatry

## 2017-03-25 ENCOUNTER — Telehealth: Payer: Self-pay | Admitting: *Deleted

## 2017-03-25 NOTE — Telephone Encounter (Signed)
"  I want to reschedule my surgery."  Do you have a date in mind?  "I'd like to do it at the end of December."  He can do it on December 19.  "That sounds perfect.  Let me check with my boss and see if that date is okay.  I'll give you a call back."  Where do you want the surgery done?  "I want to do it at [facility]."

## 2017-03-25 NOTE — Telephone Encounter (Signed)
"  I am calling you back.  I'm good for 06/03/17.  I'll get that form from the office and have a physical.  If you have any questions give me a call."

## 2017-03-27 MED FILL — clonazePAM 0.5 MG TABS: 0.5 | 30 days supply | Qty: 60 | Fill #0

## 2017-03-27 MED FILL — DULoxetine HCL 60 MG CPEP: 60 | 30 days supply | Qty: 30 | Fill #1

## 2017-03-27 MED FILL — ZOLPIDEM TARTRATE 10 MG TAB: 10 | 30 days supply | Qty: 30 | Fill #2

## 2017-03-27 NOTE — Telephone Encounter (Signed)
Ok to refill what is needed.

## 2017-03-30 ENCOUNTER — Encounter: Payer: 59 | Admitting: Podiatry

## 2017-03-30 ENCOUNTER — Telehealth: Payer: 59 | Admitting: Family

## 2017-03-30 DIAGNOSIS — R6889 Other general symptoms and signs: Secondary | ICD-10-CM | POA: Diagnosis not present

## 2017-03-30 DIAGNOSIS — J069 Acute upper respiratory infection, unspecified: Secondary | ICD-10-CM | POA: Diagnosis not present

## 2017-03-30 NOTE — Progress Notes (Signed)
E visit for Flu like symptoms   We are sorry that you are not feeling well.  Here is how we plan to help! Based on what you have shared with me it looks like you may have flu-like symptoms that should be watched but do not seem to indicate anti-viral treatment.  Influenza or "the flu" is   an infection caused by a respiratory virus. The flu virus is highly contagious and persons who did not receive their yearly flu vaccination may "catch" the flu from close contact.  We have anti-viral medications to treat the viruses that cause this infection. They are not a "cure" and only shorten the course of the infection. These prescriptions are most effective when they are given within the first 2 days of "flu" symptoms. Antiviral medication are indicated if you have a high risk of complications from the flu. You should  also consider an antiviral medication if you are in close contact with someone who is at risk. These medications can help patients avoid complications from the flu  but have side effects that you should know. Possible side effects from Tamiflu or oseltamivir include nausea, vomiting, diarrhea, dizziness, headaches, eye redness, sleep problems or other respiratory symptoms. You should not take Tamiflu if you have an allergy to oseltamivir or any to the ingredients in Tamiflu.  Based upon your symptoms and potential risk factors I recommend that you follow the flu symptoms recommendation that I have listed below.  ANYONE WHO HAS FLU SYMPTOMS SHOULD: . Stay home. The flu is highly contagious and going out or to work exposes others! . Be sure to drink plenty of fluids. Water is fine as well as fruit juices, sodas and electrolyte beverages. You may want to stay away from caffeine or alcohol. If you are nauseated, try taking small sips of liquids. How do you know if you are getting enough fluid? Your urine should be a pale yellow or almost colorless. . Get rest. . Taking a steamy shower or using a  humidifier may help nasal congestion and ease sore throat pain. Using a saline nasal spray works much the same way. . Cough drops, hard candies and sore throat lozenges may ease your cough. . Line up a caregiver. Have someone check on you regularly.   GET HELP RIGHT AWAY IF: . You cannot keep down liquids or your medications. . You become short of breath . Your fell like you are going to pass out or loose consciousness. . Your symptoms persist after you have completed your treatment plan MAKE SURE YOU   Understand these instructions.  Will watch your condition.  Will get help right away if you are not doing well or get worse.  Your e-visit answers were reviewed by a board certified advanced clinical practitioner to complete your personal care plan.  Depending on the condition, your plan could have included both over the counter or prescription medications.  If there is a problem please reply  once you have received a response from your provider.  Your safety is important to us.  If you have drug allergies check your prescription carefully.    You can use MyChart to ask questions about today's visit, request a non-urgent call back, or ask for a work or school excuse for 24 hours related to this e-Visit. If it has been greater than 24 hours you will need to follow up with your provider, or enter a new e-Visit to address those concerns.  You will get an e-mail   in the next two days asking about your experience.  I hope that your e-visit has been valuable and will speed your recovery. Thank you for using e-visits.   

## 2017-04-02 ENCOUNTER — Telehealth: Payer: Self-pay | Admitting: *Deleted

## 2017-04-02 NOTE — Telephone Encounter (Signed)
I'm returning your call.  Do you have a date in mind that you would like to schedule it for?  "How about January 9, is that available?"  I will try to get it scheduled.  I'm not sure if the surgical center has that date open at this time but I will try.  If there's any problems, I'll let you know.

## 2017-04-02 NOTE — Telephone Encounter (Signed)
"  Me and my supervisor were talking and we're going to have an issue with the December 19 surgery date.  We were wondering if there's anything available in January.  I'll have FMLA then and we'll be better able to accommodate better.  If you can please give me a call back at work or on my cell phone."

## 2017-04-09 ENCOUNTER — Other Ambulatory Visit: Payer: Self-pay

## 2017-04-09 MED ORDER — DULOXETINE HCL 60 MG PO CPEP: 60.0000 mg | ORAL_CAPSULE | Freq: Every day | ORAL | 1 refills | Status: DC

## 2017-04-14 ENCOUNTER — Ambulatory Visit (INDEPENDENT_AMBULATORY_CARE_PROVIDER_SITE_OTHER): Payer: 59 | Admitting: Family Medicine

## 2017-04-14 ENCOUNTER — Encounter: Payer: Self-pay | Admitting: Family Medicine

## 2017-04-14 ENCOUNTER — Other Ambulatory Visit (HOSPITAL_COMMUNITY)
Admission: RE | Admit: 2017-04-14 | Discharge: 2017-04-14 | Disposition: A | Discharge status: Home / Self Care | Payer: 59 | Source: Ambulatory Visit | Attending: Family Medicine | Admitting: Family Medicine

## 2017-04-14 VITALS — BP 114/74 | HR 81 | Temp 98.0°F | Ht 64.0 in | Wt 233.4 lb

## 2017-04-14 DIAGNOSIS — Z0000 Encounter for general adult medical examination without abnormal findings: Secondary | ICD-10-CM | POA: Diagnosis not present

## 2017-04-14 DIAGNOSIS — G479 Sleep disorder, unspecified: Secondary | ICD-10-CM

## 2017-04-14 DIAGNOSIS — F32A Depression, unspecified: Secondary | ICD-10-CM

## 2017-04-14 DIAGNOSIS — F329 Major depressive disorder, single episode, unspecified: Secondary | ICD-10-CM | POA: Diagnosis not present

## 2017-04-14 DIAGNOSIS — F419 Anxiety disorder, unspecified: Secondary | ICD-10-CM

## 2017-04-14 MED ORDER — CLONAZEPAM 0.5 MG PO TABS: 0.5000 mg | ORAL_TABLET | Freq: Two times a day (BID) | ORAL | 1 refills | Status: DC | PRN

## 2017-04-14 MED ORDER — ZOLPIDEM TARTRATE 10 MG PO TABS: 10.0000 mg | ORAL_TABLET | Freq: Every evening | ORAL | 2 refills | Status: DC | PRN

## 2017-04-14 NOTE — Addendum Note (Signed)
Addended by: Koleen DistanceAGNER, AMBER M on: 04/14/2017 10:31 AM   Modules accepted: Orders

## 2017-04-14 NOTE — Progress Notes (Signed)
Subjective:    [name] [name] is a 31 y.o. female and is here for a comprehensive physical exam.  Pertinent Gynecological History: No LMP recorded. Patient has had an ablation.  OB History    Gravida Para Term Preterm AB Living   2 2 2  0 0 2   SAB TAB Ectopic Multiple Live Births   0 0 0 0 2     Health Maintenance Due  Topic Date Due  . PAP SMEAR  12/15/2015   PMHx, SurgHx, SocialHx, Medications, and Allergies were reviewed in the Visit Navigator and updated as appropriate.   Past Medical History:  Diagnosis Date  . Anemia   . Anxiety   . DEPRESSION   . GERD (gastroesophageal reflux disease)   . Hypothyroidism    Hx of, normalized TSH during pregnancy 2011  . Infertility, female   . Migraines   . Morbid obesity (HCC)    s/p RY 08/2012 - start weight 290 pounds  . Nephrolithiasis   . PCOS (polycystic ovarian syndrome)   . Pregnancy induced hypertension    Past Surgical History:  Procedure Laterality Date  . CESAREAN SECTION     x 2  . FOOT SURGERY    . 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   Procedure: LAPAROSCOPIC ROUX-EN-Y GASTRIC;  Surgeon: Atilano Ina, MD;  Location: [facility];  Service: General;  Laterality: N/A;  laparoscopic roux-en-y gastric bypass  . LITHOTRIPSY Left   . TONSILLECTOMY    . TUBAL LIGATION    . UPPER GI ENDOSCOPY  08/24/2012   Procedure: UPPER GI ENDOSCOPY;  Surgeon: Atilano Ina, MD;  Location: [facility];  Service: General;;  . WISDOM TOOTH EXTRACTION     Family History  Problem Relation Age of Onset  . Hyperlipidemia Father   . Hypertension Father   . Kidney disease Father   . Colon cancer Father 77  . Hypertension Mother   . Hypertension Maternal Grandmother   . Uterine cancer Maternal Grandmother 32  . Diabetes Maternal Grandfather    Social History  Substance Use Topics  . Smoking status: Never Smoker  . Smokeless tobacco: Never Used  . Alcohol use No     Comment: rare/socially   Review of Systems:   Pertinent items are  noted in the HPI. Otherwise, ROS is negative.  Objective:   BP 114/74   Pulse 81   Temp 98 F (36.7 C) (Oral)   Ht 5\' 4"  (1.626 m)   Wt 233 lb 6.4 oz (105.9 kg)   SpO2 97%   BMI 40.06 kg/m   Wt Readings from Last 3 Encounters:  04/14/17 233 lb 6.4 oz (105.9 kg)  03/10/17 236 lb (107 kg)  01/23/17 227 lb (103 kg)     Ht Readings from Last 3 Encounters:  04/14/17 5\' 4"  (1.626 m)  03/10/17 5\' 4"  (1.626 m)  01/23/17 5\' 4"  (1.626 m)   General appearance: alert, cooperative and appears stated age. Head: normocephalic, without obvious abnormality, atraumatic. Neck: no adenopathy, supple, symmetrical, trachea midline; thyroid not enlarged, symmetric, no tenderness/mass/nodules. Lungs: clear to auscultation bilaterally. Heart: regular rate and rhythm Abdomen: soft, non-tender; no masses,  no organomegaly. Extremities: extremities normal, atraumatic, no cyanosis or edema. Skin: skin color, texture, turgor normal, no rashes or lesions. Lymph: cervical, supraclavicular, and axillary nodes normal; no abnormal inguinal nodes palpated. Neurologic: grossly normal.  Pelvic:  External genitalia: no lesions.              Urethra: normal appearing  urethra with no masses, tenderness or lesions.              Bartholins and Skenes: normal.               Vagina: normal appearing vagina with normal color and discharge, no lesions.              Cervix: normal appearance.              Pap and high risk HPV testing done: Yes.  .        Bimanual Exam:   Uterus: uterus is normal size, shape, consistency and nontender.                                      Adnexa: normal adnexa in size, nontender and no masses.                                 Assessment/Plan:   [name] [name] was seen today for establish care.  Diagnoses and all orders for this visit:  Routine physical examination Comments: No concerns for upcoming surgery.  Sleep disturbance -     zolpidem (AMBIEN) 10 MG tablet; Take 1 tablet (10 mg  total) by mouth at bedtime as needed for sleep.  Anxiety and depression -     clonazePAM (KLONOPIN) 0.5 MG tablet; Take 1 tablet (0.5 mg total) by mouth 2 (two) times daily as needed. for anxiety     Patient Counseling:   [x]     Nutrition: Stressed importance of moderation in sodium/caffeine intake, saturated fat and cholesterol, caloric balance, sufficient intake of fresh fruits, vegetables, fiber, calcium, iron, and 1 mg of folate supplement per day (for females capable of pregnancy).   [x]      Stressed the importance of regular exercise.    [x]     Substance Abuse: Discussed cessation/primary prevention of tobacco, alcohol, or other drug use; driving or other dangerous activities under the influence; availability of treatment for abuse.    [x]      Injury prevention: Discussed safety belts, safety helmets, smoke detector, smoking near bedding or upholstery.    [x]      Sexuality: Discussed sexually transmitted diseases, partner selection, use of condoms, avoidance of unintended pregnancy  and contraceptive alternatives.    [x]     Dental health: Discussed importance of regular tooth brushing, flossing, and dental visits.   [x]      Health maintenance and immunizations reviewed. Please refer to Health maintenance section.   Helane RimaErica Ebrima Ranta, DO [facility]

## 2017-04-16 LAB — CYTOLOGY - PAP: Diagnosis: NEGATIVE

## 2017-04-23 ENCOUNTER — Telehealth: Payer: Self-pay

## 2017-04-23 ENCOUNTER — Ambulatory Visit (INDEPENDENT_AMBULATORY_CARE_PROVIDER_SITE_OTHER): Payer: 59 | Admitting: Family Medicine

## 2017-04-23 ENCOUNTER — Encounter: Payer: Self-pay | Admitting: Family Medicine

## 2017-04-23 VITALS — BP 110/78 | HR 76 | Temp 98.3°F | Ht 64.0 in | Wt 234.0 lb

## 2017-04-23 DIAGNOSIS — F5081 Binge eating disorder: Secondary | ICD-10-CM | POA: Insufficient documentation

## 2017-04-23 MED ORDER — LISDEXAMFETAMINE DIMESYLATE 20 MG PO CAPS: 20.0000 mg | ORAL_CAPSULE | Freq: Every day | ORAL | 0 refills | Status: DC

## 2017-04-23 NOTE — Telephone Encounter (Signed)
PA for Vyvanse in progress.  Awaiting insurance decision.

## 2017-04-23 NOTE — Progress Notes (Signed)
[name] [name] is a 31 y.o. female is here for follow up.  History of Present Illness:   HPI:   1. Binge eating disorder. Eats large amounts when not hungry, eats alone due to embarrassment, feels loss of control, Hx of weight loss surgery with new weight gain. Comorbid conditions: depression, anxiety, OCD tendencies, social anxiety. No SI/HI. No drug use. Cardiovascular ROS: no chest pain or dyspnea on exertion.   There are no preventive care reminders to display for this patient. Depression screen PHQ 2/9 08/13/2015  Decreased Interest 1  Down, Depressed, Hopeless 1  PHQ - 2 Score 2  Altered sleeping 1  Tired, decreased energy 1  Change in appetite 1  Feeling bad or failure about yourself  1  Trouble concentrating 1  Moving slowly or fidgety/restless 0  Suicidal thoughts 0  PHQ-9 Score 7  Difficult doing work/chores Not difficult at all   PMHx, SurgHx, SocialHx, FamHx, Medications, and Allergies were reviewed in the Visit Navigator and updated as appropriate.   Patient Active Problem List   Diagnosis Date Noted  . 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 Pes planus 07/16/2016  . Hallux varus, acquired, right 03/18/2016  . Routine physical examination 08/13/2015  . Decreased attention Span 05/28/2015  . Sleep disturbance 05/28/2015  . Iron deficiency anemia 02/22/2015  . Kidney stone 08/24/2013  . Obesity (BMI 30-39.9) 04/07/2013  . 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 Sinusitis, acute 07/09/2010  . Anxiety and depression 04/10/2010  . Elevated blood pressure 04/05/2010   Social History   Tobacco Use  . Smoking status: Never Smoker  . Smokeless tobacco: Never Used  Substance Use Topics  . Alcohol use: No    Comment: rare/socially  . Drug use: No   Current Medications and Allergies:   .  Acetaminophen (TYLENOL PO), Take 500 mg by mouth every 6 (six) hours as needed (pain). , Disp: , Rfl:  .  clonazePAM (KLONOPIN) 0.5 MG tablet, Take 1  tablet (0.5 mg total) by mouth 2 (two) times daily as needed. for anxiety, Disp: 60 tablet, Rfl: 1 .  DULoxetine (CYMBALTA) 60 MG capsule, Take 1 capsule (60 mg total) by mouth daily., Disp: 90 capsule, Rfl: 1 .  zolpidem (AMBIEN) 10 MG tablet, Take 1 tablet (10 mg total) by mouth at bedtime as needed for sleep., Disp: 30 tablet, Rfl: 2  No Known Allergies   Review of Systems   Pertinent items are noted in the HPI. Otherwise, ROS is negative.  Vitals:   Vitals:   04/23/17 0811  BP: 110/78  Pulse: 76  Temp: 98.3 F (36.8 C)  TempSrc: Oral  SpO2: 97%  Weight: 234 lb (106.1 kg)  Height: 5\' 4"  (1.626 m)     Body mass index is 40.17 kg/m.   Physical Exam:   Physical Exam  Constitutional: She appears well-nourished.  HENT:  Head: Normocephalic and atraumatic.  Eyes: EOM are normal. Pupils are equal, round, and reactive to light.  Neck: Normal range of motion. Neck supple.  Cardiovascular: Normal rate, regular rhythm, normal heart sounds and intact distal pulses.  Pulmonary/Chest: Effort normal.  Abdominal: Soft.  Skin: Skin is warm.  Psychiatric: She has a normal mood and affect. Her behavior is normal.  Nursing note and vitals reviewed.   Assessment and Plan:   Diagnoses and all orders for this visit:  Binge eating disorder Comments: Meets criteria. Trial of Vyvanse. Recheck in one month for BP, weight check.  Morbid  obesity (HCC) Comments: The patient is asked to make an attempt to improve diet and exercise patterns to aid in medical management of this problem.  Orders: -     lisdexamfetamine (VYVANSE) 20 MG capsule; Take 1 capsule (20 mg total) daily by mouth.  . Reviewed expectations re: course of current medical issues. . Discussed self-management of symptoms. . Outlined signs and symptoms indicating need for more acute intervention. . Patient verbalized understanding and all questions were answered. Marland Kitchen. Health Maintenance issues including appropriate healthy  diet, exercise, and smoking avoidance were discussed with patient. . See orders for this visit as documented in the electronic medical record. . Patient received an After Visit Summary.  Helane RimaErica Judas Mohammad, DO [facility] 04/23/2017

## 2017-04-28 ENCOUNTER — Encounter: Payer: Self-pay | Admitting: Family Medicine

## 2017-05-05 ENCOUNTER — Encounter: Payer: Self-pay | Admitting: Family Medicine

## 2017-05-06 NOTE — Telephone Encounter (Signed)
Amber , have you done a prior authorization for this pt for Vyvanse.

## 2017-05-11 NOTE — Telephone Encounter (Signed)
Received ppw from insurance. [employer] denied did not meet step therapy rule for the requested drug.

## 2017-05-12 ENCOUNTER — Emergency Department (HOSPITAL_BASED_OUTPATIENT_CLINIC_OR_DEPARTMENT_OTHER)
Admission: EM | Admit: 2017-05-12 | Discharge: 2017-05-12 | Disposition: A | Discharge status: Home / Self Care | Payer: 59 | Attending: Emergency Medicine | Admitting: Emergency Medicine

## 2017-05-12 ENCOUNTER — Other Ambulatory Visit: Payer: Self-pay

## 2017-05-12 ENCOUNTER — Encounter (HOSPITAL_BASED_OUTPATIENT_CLINIC_OR_DEPARTMENT_OTHER): Payer: Self-pay

## 2017-05-12 ENCOUNTER — Emergency Department (HOSPITAL_BASED_OUTPATIENT_CLINIC_OR_DEPARTMENT_OTHER): Payer: 59

## 2017-05-12 DIAGNOSIS — N200 Calculus of kidney: Secondary | ICD-10-CM | POA: Diagnosis not present

## 2017-05-12 DIAGNOSIS — Z79899 Other long term (current) drug therapy: Secondary | ICD-10-CM | POA: Insufficient documentation

## 2017-05-12 DIAGNOSIS — R1032 Left lower quadrant pain: Secondary | ICD-10-CM | POA: Diagnosis present

## 2017-05-12 DIAGNOSIS — R109 Unspecified abdominal pain: Secondary | ICD-10-CM | POA: Diagnosis not present

## 2017-05-12 LAB — COMPREHENSIVE METABOLIC PANEL
ALBUMIN: 3.8 g/dL (ref 3.5–5.0)
ALT: 17 U/L (ref 14–54)
ANION GAP: 6 (ref 5–15)
AST: 18 U/L (ref 15–41)
Alkaline Phosphatase: 137 U/L — ABNORMAL HIGH (ref 38–126)
BILIRUBIN TOTAL: 0.4 mg/dL (ref 0.3–1.2)
BUN: 8 mg/dL (ref 6–20)
CO2: 25 mmol/L (ref 22–32)
Calcium: 8.6 mg/dL — ABNORMAL LOW (ref 8.9–10.3)
Chloride: 107 mmol/L (ref 101–111)
Creatinine, Ser: 0.58 mg/dL (ref 0.44–1.00)
GFR calc Af Amer: >60 mL/min (ref >60)
GFR calc non Af Amer: >60 mL/min (ref >60)
GLUCOSE: 89 mg/dL (ref 65–99)
POTASSIUM: 3.7 mmol/L (ref 3.5–5.1)
SODIUM: 138 mmol/L (ref 135–145)
TOTAL PROTEIN: 6.6 g/dL (ref 6.5–8.1)

## 2017-05-12 LAB — URINALYSIS, ROUTINE W REFLEX MICROSCOPIC
BILIRUBIN URINE: NEGATIVE
Glucose, UA: NEGATIVE mg/dL
Hgb urine dipstick: NEGATIVE
Ketones, ur: NEGATIVE mg/dL
Leukocytes, UA: NEGATIVE
NITRITE: NEGATIVE
PH: 6 (ref 5.0–8.0)
Protein, ur: NEGATIVE mg/dL

## 2017-05-12 LAB — CBC WITH DIFFERENTIAL/PLATELET
Basophils Absolute: 0 10*3/uL (ref 0.0–0.1)
Basophils Relative: 0 %
EOS PCT: 1 %
Eosinophils Absolute: 0 10*3/uL (ref 0.0–0.7)
HEMATOCRIT: 37.1 % (ref 36.0–46.0)
Hemoglobin: 12.6 g/dL (ref 12.0–15.0)
LYMPHS ABS: 2.6 10*3/uL (ref 0.7–4.0)
LYMPHS PCT: 32 %
MCH: 29.2 pg (ref 26.0–34.0)
MCHC: 34 g/dL (ref 30.0–36.0)
MCV: 86.1 fL (ref 78.0–100.0)
MONO ABS: 0.6 10*3/uL (ref 0.1–1.0)
Monocytes Relative: 7 %
NEUTROS ABS: 5 10*3/uL (ref 1.7–7.7)
Neutrophils Relative %: 60 %
PLATELETS: 288 10*3/uL (ref 150–400)
RBC: 4.31 MIL/uL (ref 3.87–5.11)
RDW: 13.5 % (ref 11.5–15.5)
WBC: 8.3 10*3/uL (ref 4.0–10.5)

## 2017-05-12 LAB — PREGNANCY, URINE: PREG TEST UR: NEGATIVE

## 2017-05-12 MED ORDER — PROMETHAZINE HCL 25 MG/ML IJ SOLN
25.0000 mg | Freq: Once | INTRAMUSCULAR | Status: AC
  Administered 2017-05-12: 25 mg via INTRAVENOUS
  Filled 2017-05-12: qty 1

## 2017-05-12 MED ORDER — OXYCODONE-ACETAMINOPHEN 5-325 MG PO TABS: 1.0000 | ORAL_TABLET | Freq: Four times a day (QID) | ORAL | 0 refills | Status: DC | PRN

## 2017-05-12 MED ORDER — PROMETHAZINE HCL 25 MG PO TABS: 25.0000 mg | ORAL_TABLET | Freq: Four times a day (QID) | ORAL | 0 refills | Status: DC | PRN

## 2017-05-12 MED ORDER — KETOROLAC TROMETHAMINE 30 MG/ML IJ SOLN
30.0000 mg | Freq: Once | INTRAMUSCULAR | Status: AC
  Administered 2017-05-12: 30 mg via INTRAVENOUS
  Filled 2017-05-12: qty 1

## 2017-05-12 NOTE — ED Provider Notes (Signed)
[facility] EMERGENCY DEPARTMENT Provider Note   CSN: [id number] Arrival date & time: 05/12/17  1758     History   Chief Complaint Chief Complaint  Patient presents with  . Flank Pain    HPI Josie DixonSamantha D Grime is a 31 y.o. female with a history of nephrolithiasis and gastric bypass surgery who presents to the emergency department with a chief complaint of sudden onset, worsening left flank pain that radiates as a sharp pain to the left lower quadrant that began last night with associated nausea and dysuria.  The pain has been constant since onset.  No aggravating or alleviating factors.  She states "this feels just like when I get a kidney stone." She denies fever, chills, emesis, diarrhea, right flank pain, hematuria, or vaginal pain, itching, or discharge.  She treated her symptoms this morning with Tylenol without relief.  She is established with [facility] who she last saw in July/August 2018.  She had imaging performed, which did not show any stones at that time.  She reports that she would typically get a stone approximately 3-4 months after having an episode of dehydration.  She reports that [place] her last visit to her urologist.   No LMP.  She has a history of an ablation and tubal ligation.  The history is provided by the patient. No language interpreter was used.  Flank Pain  This is a recurrent problem. The current episode started yesterday. The problem occurs constantly. The problem has been gradually worsening. Associated symptoms include abdominal pain. Pertinent negatives include no chest pain, no headaches and no shortness of breath. Nothing relieves the symptoms. She has tried acetaminophen for the symptoms. The treatment provided no relief.    Past Medical History:  Diagnosis Date  . Anemia   . Anxiety   . DEPRESSION   . GERD (gastroesophageal reflux disease)   . Hypothyroidism    Hx of, normalized TSH  during pregnancy 2011  . Infertility, female   . Migraines   . Morbid obesity (HCC)    s/p RY 08/2012 - start weight 290 pounds  . Nephrolithiasis   . PCOS (polycystic ovarian syndrome)   . Pregnancy induced hypertension     Patient Active Problem List   Diagnosis Date Noted  . Binge eating disorder 04/23/2017  . Morbid obesity (HCC) 04/23/2017  . 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 Pes planus 07/16/2016  . Hallux varus, acquired, right 03/18/2016  . Routine physical examination 08/13/2015  . Decreased attention Span 05/28/2015  . Sleep disturbance 05/28/2015  . Iron deficiency anemia 02/22/2015  . Kidney stone 08/24/2013  . Obesity (BMI 30-39.9) 04/07/2013  . 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 Sinusitis, acute 07/09/2010  . Anxiety and depression 04/10/2010  . Elevated blood pressure 04/05/2010    Past Surgical History:  Procedure Laterality Date  . CESAREAN SECTION     x 2  . FOOT SURGERY    . 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   Procedure: LAPAROSCOPIC ROUX-EN-Y GASTRIC;  Surgeon: Atilano InaEric M Wilson, MD;  Location: [facility];  Service: General;  Laterality: N/A;  laparoscopic roux-en-y gastric bypass  . LITHOTRIPSY Left   . TONSILLECTOMY    . TUBAL LIGATION    . UPPER GI ENDOSCOPY  08/24/2012   Procedure: UPPER GI ENDOSCOPY;  Surgeon: Atilano InaEric M Wilson, MD;  Location: [facility];  Service: General;;  . WISDOM TOOTH EXTRACTION      OB  History    Gravida Para Term Preterm AB Living   2 2 2  0 0 2   SAB TAB Ectopic Multiple Live Births   0 0 0 0 2       Home Medications    Prior to Admission medications   Medication Sig Start Date End Date Taking? Authorizing Provider  Acetaminophen (TYLENOL PO) Take 500 mg by mouth every 6 (six) hours as needed (pain).     [provider]  clonazePAM (KLONOPIN) 0.5 MG tablet Take 1 tablet (0.5 mg total) by mouth 2 (two) times daily as needed. for anxiety 04/14/17   Helane RimaWallace, Erica, DO  DULoxetine (CYMBALTA) 60  MG capsule Take 1 capsule (60 mg total) by mouth daily. 04/09/17   Evaristo BuryShambley, Ashleigh N, NP  lisdexamfetamine (VYVANSE) 20 MG capsule Take 1 capsule (20 mg total) daily by mouth. 04/23/17   Helane RimaWallace, Erica, DO  oxyCODONE-acetaminophen (PERCOCET/ROXICET) 5-325 MG tablet Take 1 tablet by mouth every 6 (six) hours as needed for severe pain. 05/12/17   Kaysea Raya A, PA-C  promethazine (PHENERGAN) 25 MG tablet Take 1 tablet (25 mg total) by mouth every 6 (six) hours as needed for nausea or vomiting. 05/12/17   Owynn Mosqueda A, PA-C  zolpidem (AMBIEN) 10 MG tablet Take 1 tablet (10 mg total) by mouth at bedtime as needed for sleep. 04/14/17   Helane RimaWallace, Erica, DO    Family History Family History  Problem Relation Age of Onset  . Hyperlipidemia Father   . Hypertension Father   . Kidney disease Father   . Colon cancer Father [id number]  . Hypertension Mother   . Hypertension Maternal Grandmother   . Uterine cancer Maternal Grandmother [id number]  . Diabetes Maternal Grandfather     Social History Social History   Tobacco Use  . Smoking status: Never Smoker  . Smokeless tobacco: Never Used  Substance Use Topics  . Alcohol use: No    Comment: rare/socially  . Drug use: No     Allergies   Patient has no known allergies.   Review of Systems Review of Systems  Constitutional: Negative for chills and fever.  Respiratory: Negative for shortness of breath.   Cardiovascular: Negative for chest pain.  Gastrointestinal: Positive for abdominal pain and nausea. Negative for abdominal distention, anal bleeding, blood in stool, constipation, diarrhea and vomiting.  Genitourinary: Positive for dysuria and flank pain. Negative for hematuria and urgency.  Musculoskeletal: Negative for neck pain and neck stiffness.  Skin: Negative for rash.  Allergic/Immunologic: Negative for immunocompromised state.  Neurological: Negative for dizziness and headaches.     Physical Exam Updated Vital Signs BP (!) 152/93 (BP  Location: Right Arm)   Pulse 84   Temp 98.2 F (36.8 C) (Oral)   Resp 16   Ht 5\' 4"  (1.626 m)   Wt 105.2 kg (232 lb)   SpO2 100%   BMI 39.82 kg/m   Physical Exam  Constitutional: No distress.  HENT:  Head: Normocephalic.  Eyes: Conjunctivae are normal.  Neck: Neck supple.  Cardiovascular: Normal rate, regular rhythm, normal heart sounds and intact distal pulses. Exam reveals no gallop and no friction rub.  No murmur heard. Pulmonary/Chest: Effort normal and breath sounds normal. No stridor. No respiratory distress. She has no wheezes. She has no rales. She exhibits no tenderness.  Abdominal: Soft. Bowel sounds are normal. She exhibits no distension and no mass. There is tenderness. There is no rebound and no guarding. No hernia.  Obese abdomen.  Severely tender  to palpation over the left CVA.  Mild tenderness to palpation over the right CVA.  No peritoneal signs.  Neurological: She is alert.  Skin: Skin is warm. No rash noted.  Psychiatric: Her behavior is normal.  Nursing note and vitals reviewed.    ED Treatments / Results  Labs (all labs ordered are listed, but only abnormal results are displayed) Labs Reviewed  URINALYSIS, ROUTINE W REFLEX MICROSCOPIC - Abnormal; Notable for the following components:      Result Value   APPearance HAZY (*)    Specific Gravity, Urine >1.030 (*)    All other components within normal limits  COMPREHENSIVE METABOLIC PANEL - Abnormal; Notable for the following components:   Calcium 8.6 (*)    Alkaline Phosphatase 137 (*)    All other components within normal limits  PREGNANCY, URINE  CBC WITH DIFFERENTIAL/PLATELET    EKG  EKG Interpretation None       Radiology US Renal  Result Date: 05/12/2017 CLINICAL DATA:  Left flank pain EXAM: RENAL / URINARY TRACT ULTRASOUND COMPLETE COMPARISON:  CT 01/27/2017, ultrasound 01/08/2017 FINDINGS: Right Kidney: Length: 12.7 cm. Echogenicity within normal limits. No mass or hydronephrosis  visualized. Possible 3 mm stone in the midpole Left Kidney: Length: 12.8 cm. Echogenicity within normal limits. No mass or hydronephrosis visualized. Prominent left extrarenal pelvis. Possible punctate 3 mm stone lower pole. Bladder: Appears normal for degree of bladder distention. IMPRESSION: 1. No definite hydronephrosis.  Prominent left extrarenal pelvis. 2. Possible small stones in the kidneys Electronically Signed   By: Jasmine Pang M.D.   On: 05/12/2017 21:16    Procedures Procedures (including critical care time)  Medications Ordered in ED Medications  promethazine (PHENERGAN) injection 25 mg (25 mg Intravenous Given 05/12/17 2011)  ketorolac (TORADOL) 30 MG/ML injection 30 mg (30 mg Intravenous Given 05/12/17 2058)     Initial Impression / Assessment and Plan / ED Course  I have reviewed the triage vital signs and the nursing notes.  Pertinent labs & imaging results that were available during my care of the patient were reviewed by me and considered in my medical decision making (see chart for details).     31 year old female with a history of nephrolithiasis and gastric bypass presenting with left flank pain and nausea since last night.. On physical exam, the patient has tenderness to the bilateral CVA; L>R. Urinalysis with elevated specific gravity, but otherwise unremarkable.  Renal ultrasound demonstrating a possible 3 mm stone in the midpole of the right kidney and a possible punctate stone in the lower pole of the left kidney.  Prominent left extrarenal pelvis.  No hydronephrosis or extended bladder.  No constitutional symptoms or leukocytosis.  Doubt urosepsis.  Discussed the findings with the patient.  She reports she has a prescription of Flomax at home and a stone strainer already at home.  Encouraged her to take Flomax daily until the stone expels.  Will send the patient home with a short course of pain control since she is unable to take NSAIDs due to her history of gastric  bypass.  A 17-month prescription history query was performed using the [place] CSRS prior to discharge. Encouraged her to take Tylenol for mild to moderate pain control.  We will sent home with an antiemetic for nausea and vomiting control.  Recommended follow-up with urology if her symptoms do not improve within the next week.  Strict return precautions given.  No acute distress.  The patient is safe for discharge at this time.  Final Clinical Impressions(s) / ED Diagnoses   Final diagnoses:  Bilateral nephrolithiasis    ED Discharge Orders        Ordered    promethazine (PHENERGAN) 25 MG tablet  Every 6 hours PRN     05/12/17 2147    oxyCODONE-acetaminophen (PERCOCET/ROXICET) 5-325 MG tablet  Every 6 hours PRN     05/12/17 2148       Kristina Mcnorton A, PA-C 05/12/17 2154    Pricilla Loveless, MD 05/12/17 2334

## 2017-05-12 NOTE — ED Triage Notes (Signed)
Pt c/o L side flank pain that started yesterday with dysuria.

## 2017-05-12 NOTE — ED Notes (Signed)
ED Provider at bedside. 

## 2017-05-12 NOTE — Discharge Instructions (Signed)
Take 1 tablet of Flomax at home daily until you expel the stone(s).  You may take 1 tablet of Percocet every 6 hours when you have severe pain.  Please do not drive or work while taking this medication.  It is a narcotic and can be addicting so please only use when he has severe pain.  Take 1 tablet of Phenergan every 6 hours as needed for nausea and vomiting.  Please follow-up with your urologist in the next week if your symptoms do not start to improve.  Please continue to drink plenty of water and stay hydrated.  If you develop new or worsening symptoms, including blood in your urine, fever, chills, nausea or vomiting despite taking Phenergan, or uncontrolled pain, please return to the emergency department for reevaluation.

## 2017-05-12 NOTE — Telephone Encounter (Signed)
Patient's insurance requires patient try an SSRI (citalopram, fluvoxamine, or sertraline) or topiramate before they will cover Vyvanse for binge eating disorder.

## 2017-05-12 NOTE — ED Notes (Signed)
Patient transported to Ultrasound 

## 2017-05-13 NOTE — Telephone Encounter (Signed)
She is already on Cymbalta.

## 2017-05-14 ENCOUNTER — Telehealth: Payer: Self-pay | Admitting: *Deleted

## 2017-05-14 DIAGNOSIS — R8271 Bacteriuria: Secondary | ICD-10-CM | POA: Diagnosis not present

## 2017-05-14 DIAGNOSIS — N200 Calculus of kidney: Secondary | ICD-10-CM | POA: Diagnosis not present

## 2017-05-14 DIAGNOSIS — M545 Low back pain: Secondary | ICD-10-CM | POA: Diagnosis not present

## 2017-05-14 MED FILL — OXYCOD/ACETAMINOPHEN 5-325M: 5-325 | 3 days supply | Qty: 12 | Fill #0

## 2017-05-14 NOTE — Telephone Encounter (Signed)
I have resubmitted the PA based on this information.

## 2017-05-14 NOTE — Telephone Encounter (Signed)
I am calling to let you know your history and physical form will not be any good by the time your surgery comes about on January 9.  The physical has to be completed within 30 days of the scheduled date.  "So, that means I have to schedule another appointment to have that done?"  Yes, that is correct.

## 2017-05-15 ENCOUNTER — Encounter: Payer: Self-pay | Admitting: Family Medicine

## 2017-05-19 ENCOUNTER — Ambulatory Visit: Payer: 59 | Admitting: Family Medicine

## 2017-05-21 MED FILL — clonazePAM 0.5 MG TABS: 0.5 | 30 days supply | Qty: 60 | Fill #1

## 2017-05-21 MED FILL — VYVANSE 20 MG CAPSULE: 20 | 30 days supply | Qty: 30 | Fill #0

## 2017-05-21 NOTE — Telephone Encounter (Signed)
PA for Vyvanse has been approved.  Faxed approval notice to patient's pharmacy.

## 2017-05-31 ENCOUNTER — Encounter (HOSPITAL_COMMUNITY): Payer: Self-pay | Admitting: Emergency Medicine

## 2017-05-31 ENCOUNTER — Emergency Department (HOSPITAL_COMMUNITY): Payer: 59

## 2017-05-31 ENCOUNTER — Observation Stay (HOSPITAL_COMMUNITY)
Admission: EM | Admit: 2017-05-31 | Discharge: 2017-06-01 | Disposition: A | Discharge status: Home / Self Care | Payer: 59 | Attending: Internal Medicine | Admitting: Internal Medicine

## 2017-05-31 DIAGNOSIS — F419 Anxiety disorder, unspecified: Secondary | ICD-10-CM | POA: Insufficient documentation

## 2017-05-31 DIAGNOSIS — E669 Obesity, unspecified: Secondary | ICD-10-CM

## 2017-05-31 DIAGNOSIS — D509 Iron deficiency anemia, unspecified: Secondary | ICD-10-CM | POA: Diagnosis not present

## 2017-05-31 DIAGNOSIS — E039 Hypothyroidism, unspecified: Secondary | ICD-10-CM | POA: Diagnosis not present

## 2017-05-31 DIAGNOSIS — F329 Major depressive disorder, single episode, unspecified: Secondary | ICD-10-CM | POA: Diagnosis not present

## 2017-05-31 DIAGNOSIS — T403X1A Poisoning by methadone, accidental (unintentional), initial encounter: Secondary | ICD-10-CM | POA: Diagnosis not present

## 2017-05-31 DIAGNOSIS — T40601A Poisoning by unspecified narcotics, accidental (unintentional), initial encounter: Secondary | ICD-10-CM | POA: Diagnosis present

## 2017-05-31 DIAGNOSIS — Z87442 Personal history of urinary calculi: Secondary | ICD-10-CM | POA: Insufficient documentation

## 2017-05-31 DIAGNOSIS — Z6839 Body mass index (BMI) 39.0-39.9, adult: Secondary | ICD-10-CM | POA: Insufficient documentation

## 2017-05-31 DIAGNOSIS — E282 Polycystic ovarian syndrome: Secondary | ICD-10-CM | POA: Diagnosis not present

## 2017-05-31 DIAGNOSIS — Z9884 Bariatric surgery status: Secondary | ICD-10-CM | POA: Diagnosis not present

## 2017-05-31 DIAGNOSIS — Z79899 Other long term (current) drug therapy: Secondary | ICD-10-CM | POA: Insufficient documentation

## 2017-05-31 DIAGNOSIS — G4733 Obstructive sleep apnea (adult) (pediatric): Secondary | ICD-10-CM | POA: Diagnosis not present

## 2017-05-31 DIAGNOSIS — K219 Gastro-esophageal reflux disease without esophagitis: Secondary | ICD-10-CM | POA: Diagnosis not present

## 2017-05-31 DIAGNOSIS — R4020 Unspecified coma: Secondary | ICD-10-CM | POA: Diagnosis not present

## 2017-05-31 DIAGNOSIS — R092 Respiratory arrest: Secondary | ICD-10-CM | POA: Diagnosis not present

## 2017-05-31 LAB — COMPREHENSIVE METABOLIC PANEL
ALBUMIN: 3.9 g/dL (ref 3.5–5.0)
ALT: 17 U/L (ref 14–54)
AST: 26 U/L (ref 15–41)
Alkaline Phosphatase: 166 U/L — ABNORMAL HIGH (ref 38–126)
Anion gap: 11 (ref 5–15)
BUN: 5 mg/dL — AB (ref 6–20)
CHLORIDE: 99 mmol/L — AB (ref 101–111)
CO2: 23 mmol/L (ref 22–32)
Calcium: 8.2 mg/dL — ABNORMAL LOW (ref 8.9–10.3)
Creatinine, Ser: 0.89 mg/dL (ref 0.44–1.00)
GFR calc Af Amer: >60 mL/min (ref >60)
GFR calc non Af Amer: >60 mL/min (ref >60)
GLUCOSE: 338 mg/dL — AB (ref 65–99)
POTASSIUM: 3.6 mmol/L (ref 3.5–5.1)
Sodium: 133 mmol/L — ABNORMAL LOW (ref 135–145)
Total Bilirubin: 0.5 mg/dL (ref 0.3–1.2)
Total Protein: 6.8 g/dL (ref 6.5–8.1)

## 2017-05-31 LAB — I-STAT VENOUS BLOOD GAS, ED
ACID-BASE DEFICIT: 1 mmol/L (ref 0.0–2.0)
Bicarbonate: 24.9 mmol/L (ref 20.0–28.0)
O2 Saturation: 55 %
PH VEN: 7.352 (ref 7.250–7.430)
PO2 VEN: 30 mmHg — AB (ref 32.0–45.0)
TCO2: 26 mmol/L (ref 22–32)
pCO2, Ven: 44.8 mmHg (ref 44.0–60.0)

## 2017-05-31 LAB — CBC WITH DIFFERENTIAL/PLATELET
BASOS ABS: 0 10*3/uL (ref 0.0–0.1)
BASOS PCT: 0 %
EOS ABS: 0 10*3/uL (ref 0.0–0.7)
Eosinophils Relative: 0 %
HCT: 39.8 % (ref 36.0–46.0)
Hemoglobin: 13.2 g/dL (ref 12.0–15.0)
Lymphocytes Relative: 3 %
Lymphs Abs: 0.8 10*3/uL (ref 0.7–4.0)
MCH: 29.3 pg (ref 26.0–34.0)
MCHC: 33.2 g/dL (ref 30.0–36.0)
MCV: 88.2 fL (ref 78.0–100.0)
MONO ABS: 1.7 10*3/uL — AB (ref 0.1–1.0)
Monocytes Relative: 6 %
NEUTROS PCT: 91 %
Neutro Abs: 25.4 10*3/uL — ABNORMAL HIGH (ref 1.7–7.7)
PLATELETS: 303 10*3/uL (ref 150–400)
RBC: 4.51 MIL/uL (ref 3.87–5.11)
RDW: 13.5 % (ref 11.5–15.5)
WBC: 27.9 10*3/uL — AB (ref 4.0–10.5)

## 2017-05-31 LAB — RAPID URINE DRUG SCREEN, HOSP PERFORMED
AMPHETAMINES: POSITIVE — AB
BARBITURATES: NOT DETECTED
Benzodiazepines: NOT DETECTED
Cocaine: NOT DETECTED
OPIATES: NOT DETECTED
TETRAHYDROCANNABINOL: NOT DETECTED

## 2017-05-31 LAB — CBG MONITORING, ED: GLUCOSE-CAPILLARY: 86 mg/dL (ref 65–99)

## 2017-05-31 IMAGING — CT CT HEAD W/O CM
3 series · 14 of 47 positions shown, 16 images · non-contrast
Comparison: None.

CLINICAL DATA: Patient found unresponsive after taking methadone.

EXAM:
CT HEAD WITHOUT CONTRAST
TECHNIQUE: Contiguous axial images were obtained from the base of the skull
through the vertex without intravenous contrast.

[Series 3: head 5.0 h30s · axial · 0.41mm/px · z∈[-94,+36]mm · 8 of 32 slices shown, 10 images]
[im 3/32  brain]
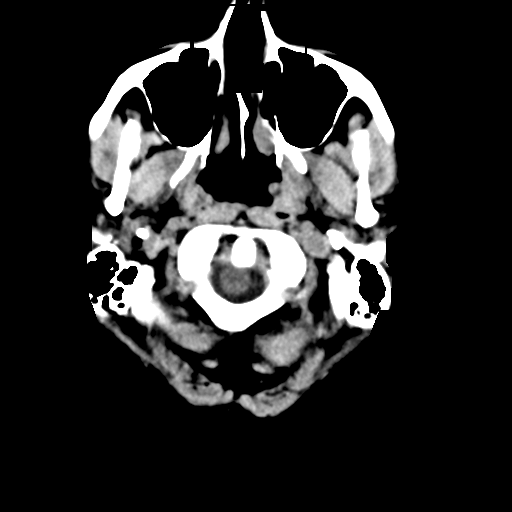
[im 3/32  bone]
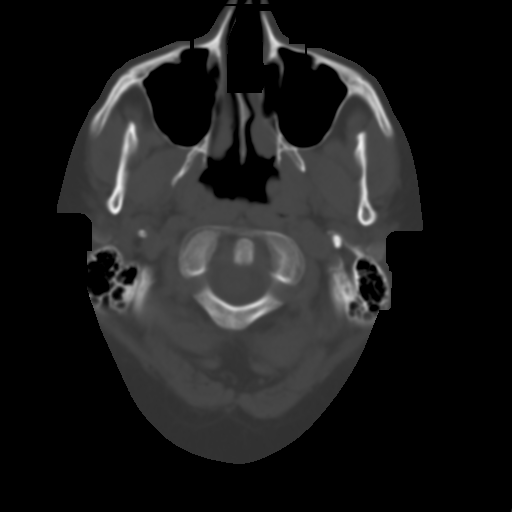
[im 7/32  brain]
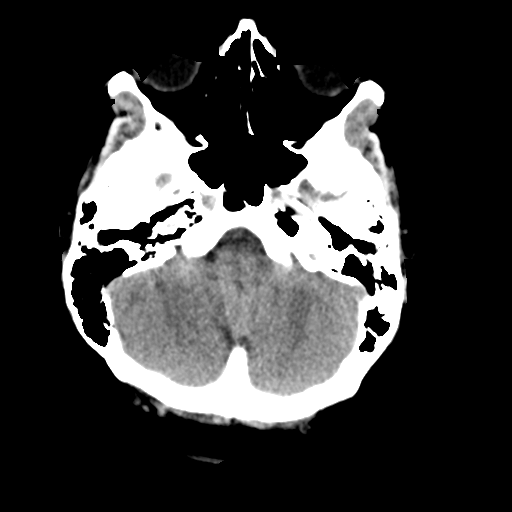
[im 10/32  brain]
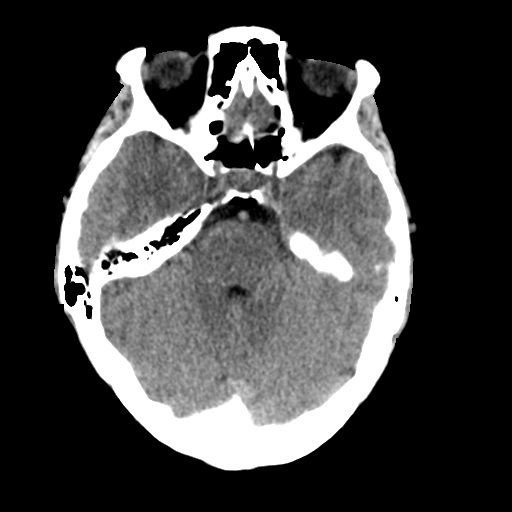
[im 14/32  brain]
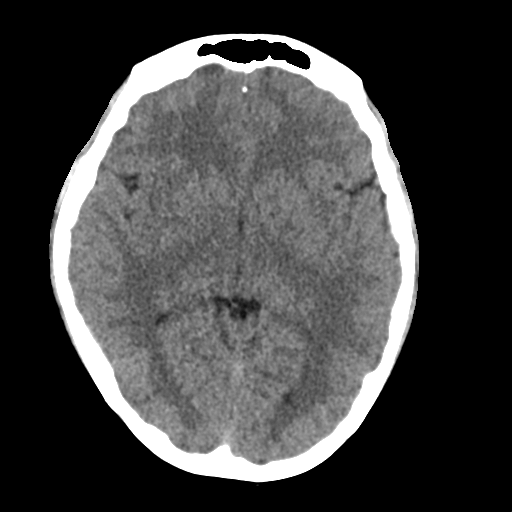
[im 18/32  brain]
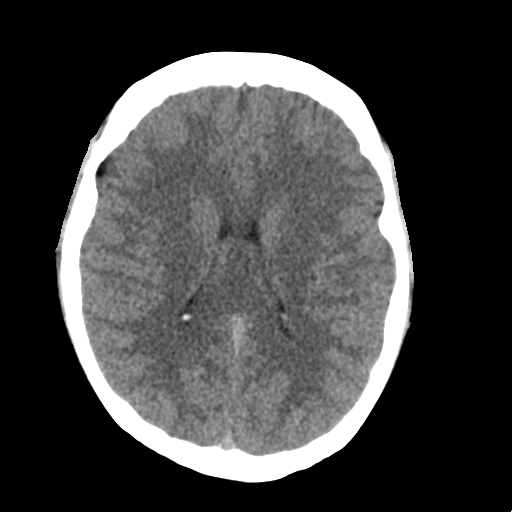
[im 18/32  bone]
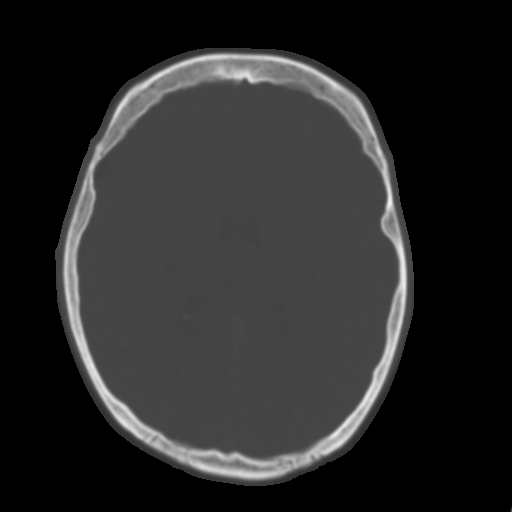
[im 22/32  brain]
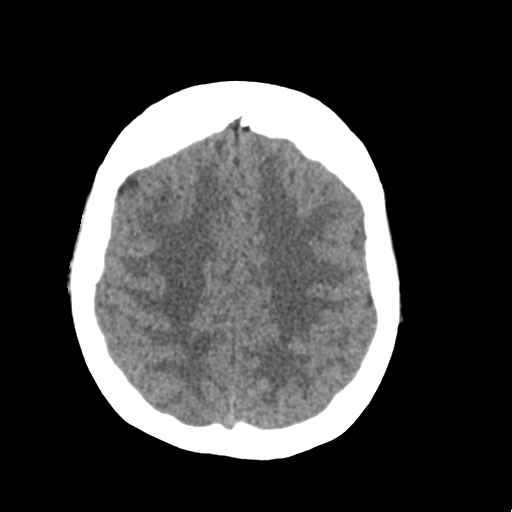
[im 25/32  brain]
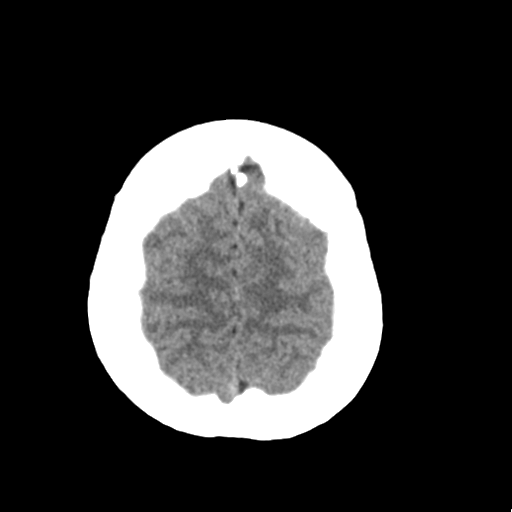
[im 29/32  brain]
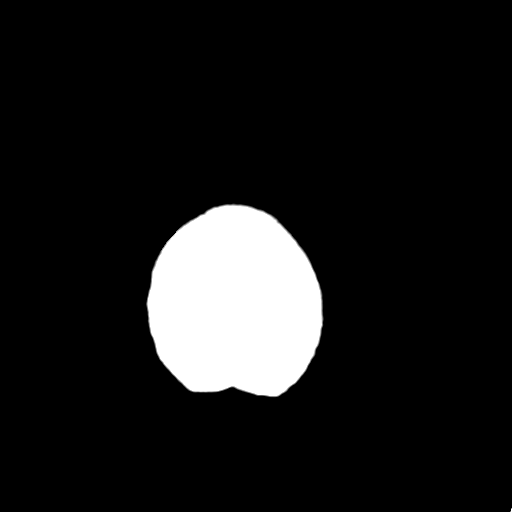

[Series 5: head 3.0 mpr cor · coronal · 0.31mm/px · 3 of 70 slices shown]
[im 24/70  brain]
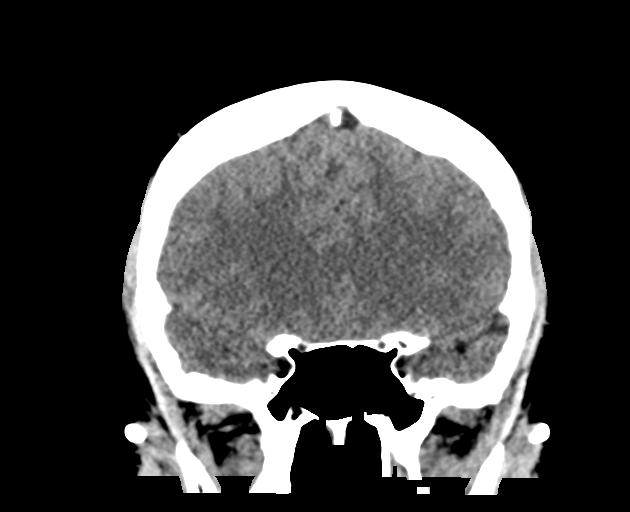
[im 31/70  brain]
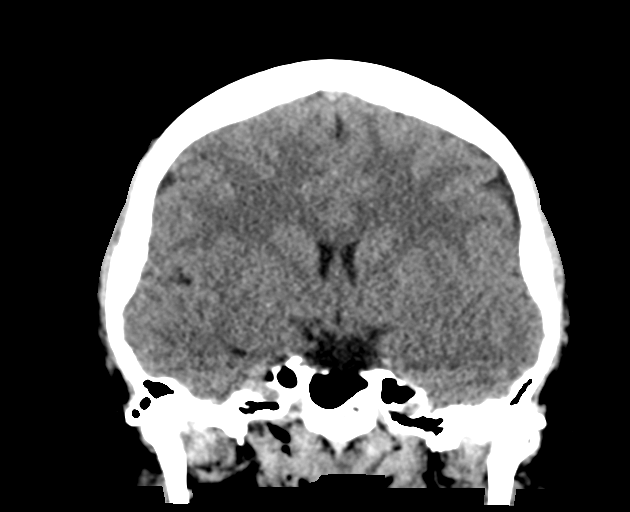
[im 39/70  brain]
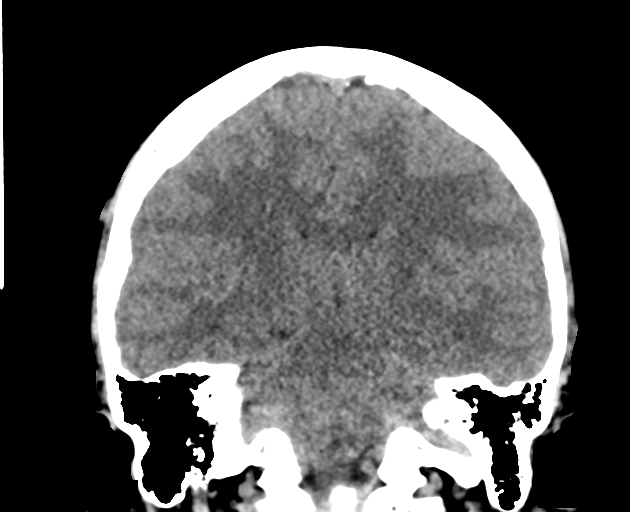

[Series 6: head 3.0 mpr sag · sagittal · 0.31mm/px · 3 of 67 slices shown]
[im 23/67  brain]
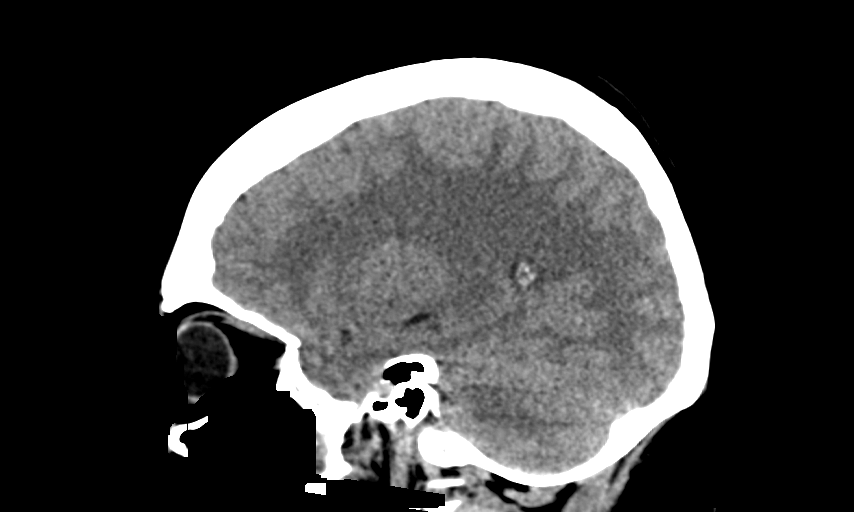
[im 34/67  brain]
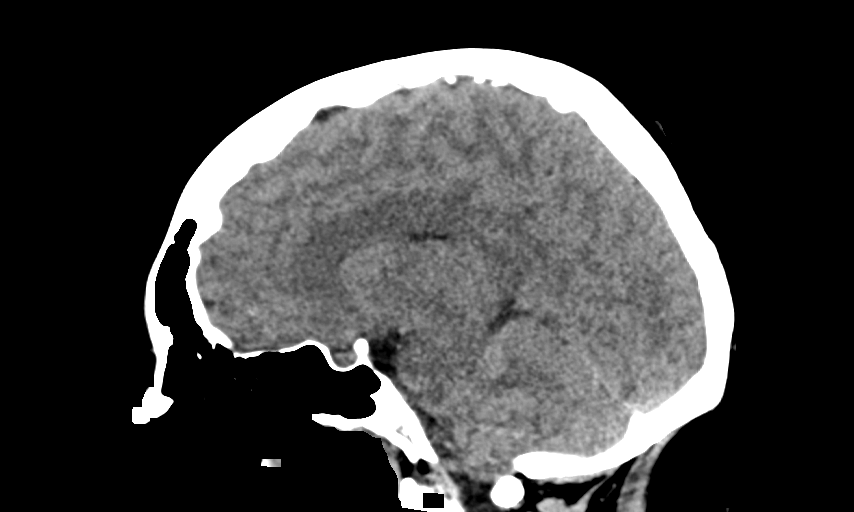
[im 45/67  brain]
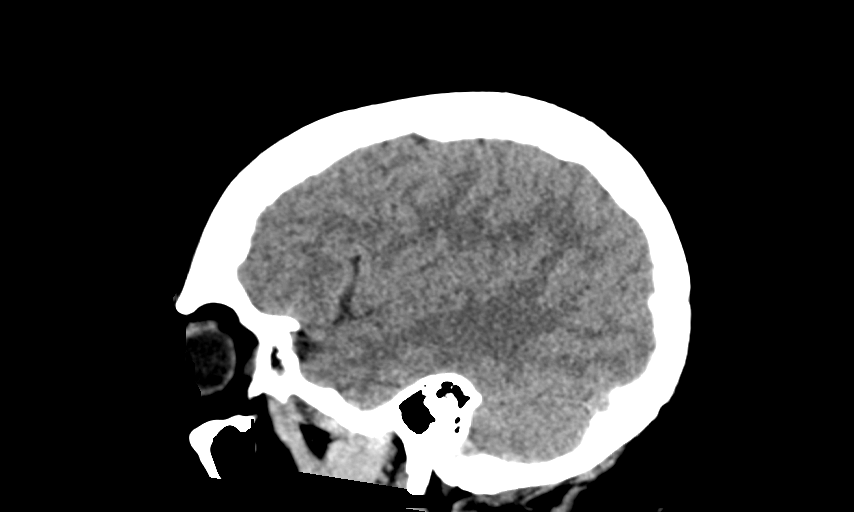

[14 of 47 positions shown; findings below may reference images not displayed]

FINDINGS: Brain: No evidence of acute infarction, hemorrhage, hydrocephalus,
extra-axial collection or mass lesion/mass effect.

Vascular: No hyperdense vessel or unexpected calcification.

Skull: Normal. Negative for fracture or focal lesion.

Sinuses/Orbits: No acute finding.

Other: None
IMPRESSION: Normal head CT.

## 2017-05-31 IMAGING — DX DG CHEST 1V PORT
1 series · 1 of 1 positions shown · non-contrast
Comparison: [DATE]

CLINICAL DATA: Patient found unresponsive after taking methadone.

EXAM:
PORTABLE CHEST 1 VIEW

[chest ap]
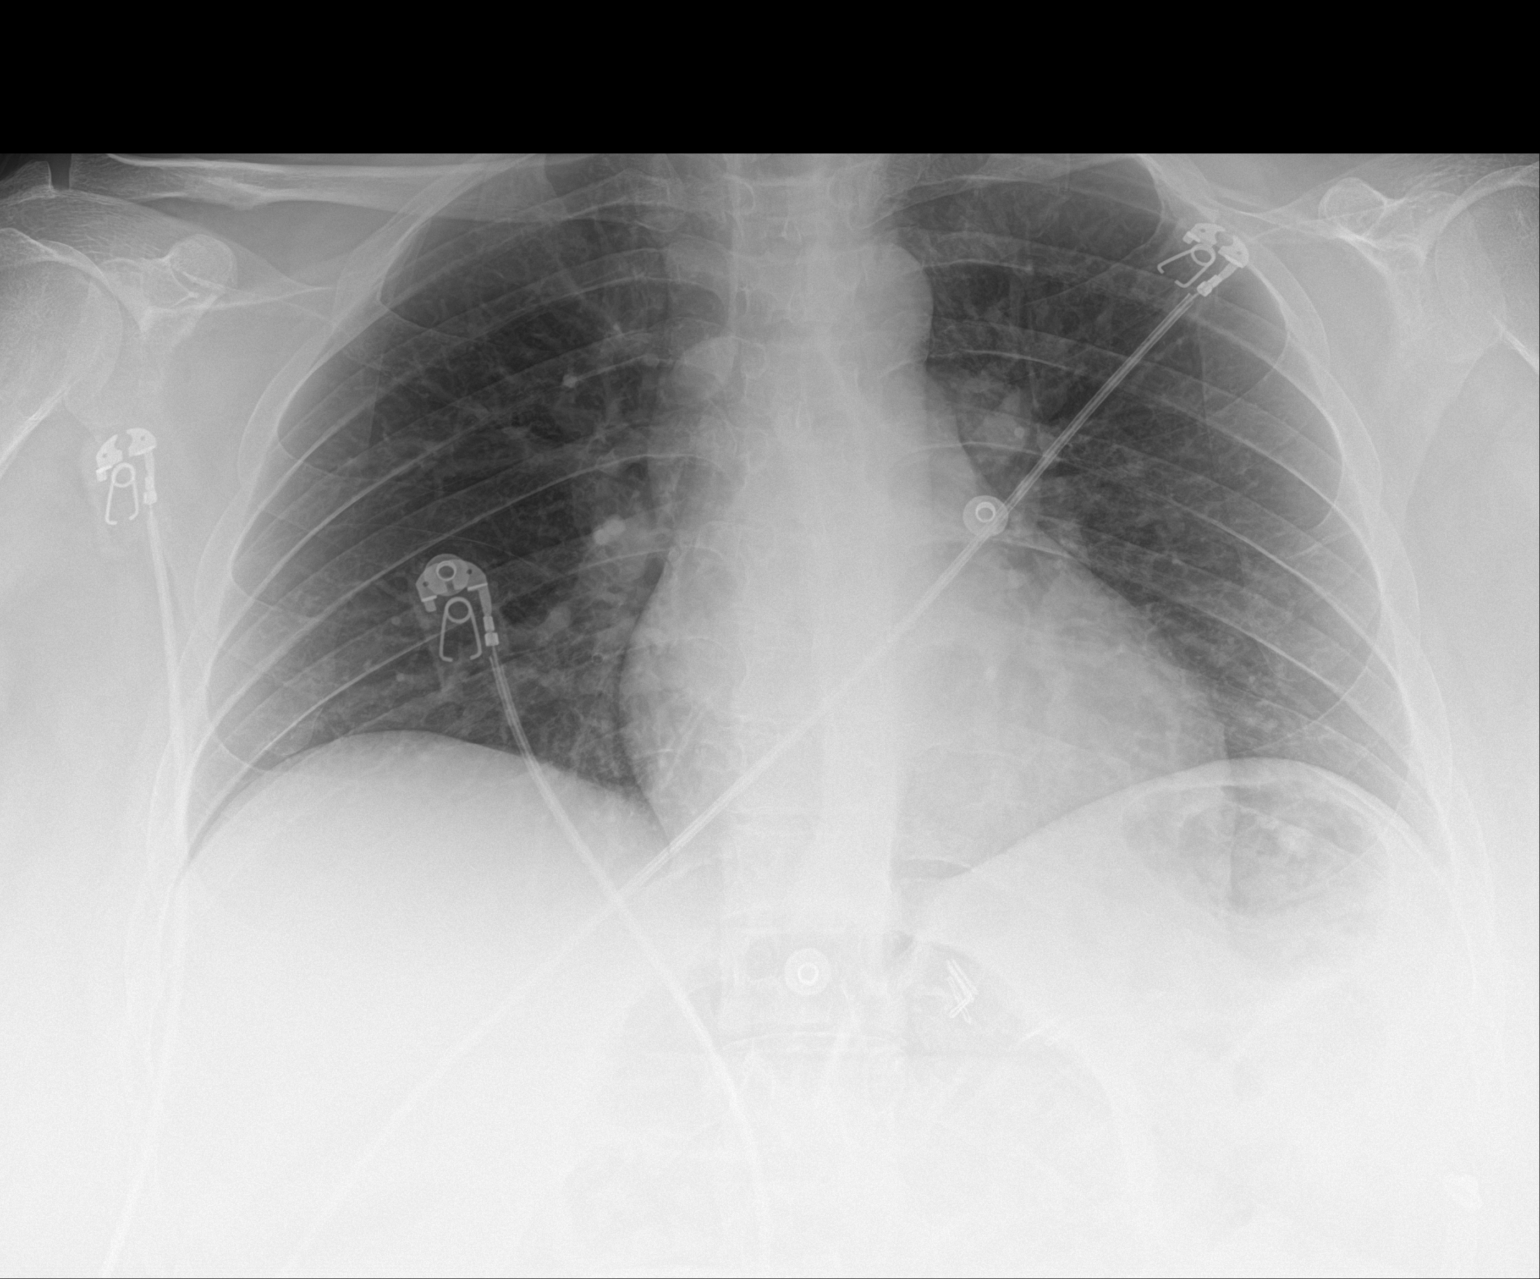

[1 of 1 positions shown; findings below may reference images not displayed]

FINDINGS: The heart size and mediastinal contours are within normal limits.
Both lungs are clear. The visualized skeletal structures are
unremarkable.
IMPRESSION: Normal chest radiograph.

## 2017-05-31 MED ORDER — NALOXONE HCL 2 MG/2ML IJ SOSY
1.0000 mg | PREFILLED_SYRINGE | Freq: Once | INTRAMUSCULAR | Status: AC
  Administered 2017-05-31: 1 mg via INTRAVENOUS
  Filled 2017-05-31: qty 2

## 2017-05-31 MED ORDER — NALOXONE HCL 0.4 MG/ML IJ SOLN: 0.4000 mg | INTRAMUSCULAR | Status: DC | PRN

## 2017-05-31 MED ORDER — ENOXAPARIN SODIUM 40 MG/0.4ML ~~LOC~~ SOLN
40.0000 mg | SUBCUTANEOUS | Status: DC
  Administered 2017-06-01: 40 mg via SUBCUTANEOUS
  Filled 2017-05-31: qty 0.4

## 2017-05-31 MED ORDER — ACETAMINOPHEN 500 MG PO TABS
1000.0000 mg | ORAL_TABLET | Freq: Once | ORAL | Status: AC
  Administered 2017-05-31: 1000 mg via ORAL
  Filled 2017-05-31: qty 2

## 2017-05-31 MED ORDER — ONDANSETRON HCL 4 MG/2ML IJ SOLN: 4.0000 mg | Freq: Four times a day (QID) | INTRAMUSCULAR | Status: DC | PRN

## 2017-05-31 MED ORDER — ONDANSETRON HCL 4 MG PO TABS: 4.0000 mg | ORAL_TABLET | Freq: Four times a day (QID) | ORAL | Status: DC | PRN

## 2017-05-31 MED ORDER — SODIUM CHLORIDE 0.9 % IV BOLUS (SEPSIS)
1000.0000 mL | Freq: Once | INTRAVENOUS | Status: AC
  Administered 2017-05-31: 1000 mL via INTRAVENOUS

## 2017-05-31 MED ORDER — SODIUM CHLORIDE 0.9 % IV SOLN
1000.0000 mL | INTRAVENOUS | Status: DC
  Administered 2017-06-01: 1000 mL via INTRAVENOUS

## 2017-05-31 MED ORDER — ACETAMINOPHEN 650 MG RE SUPP
650.0000 mg | Freq: Four times a day (QID) | RECTAL | Status: DC | PRN
Start: 2017-05-31 — End: 2017-06-01

## 2017-05-31 MED ORDER — ACETAMINOPHEN 325 MG PO TABS
650.0000 mg | ORAL_TABLET | Freq: Four times a day (QID) | ORAL | Status: DC | PRN
  Administered 2017-06-01 (×3): 650 mg via ORAL
  Filled 2017-05-31 (×4): qty 2

## 2017-05-31 NOTE — ED Notes (Signed)
Called lab to add on blood work 

## 2017-05-31 NOTE — ED Provider Notes (Signed)
[facility] Provider Note   CSN: [id number] Arrival date & time: 05/31/17  1913     History   Chief Complaint Chief Complaint  Patient presents with  . Drug Overdose    HPI [name] [name] is a 31 y.o. female.  HPI At this time, history is from 2 sources.  Was given the opportunity to have family members leave the room to discuss surrounding events but she declined.  I had been informed by nursing staff the patient did not want family members to know that she had taken methadone it was not prescribed to her.  History is that patient was unresponsive and required bagging and chest compressions.  Reportedly she admitted to taking 2 of her father-in-law's methadone.  Upon bagging patient immediately became responsive and reportedly was not administered any Narcan in the field.  By the time the patient arrived she is awake and alert in no respiratory distress.  I interviewed her in the room and at this time her husband and mother are present.  Patient reported that she has been up with her kids helping them with homework and activities and got a migraine which apparently is not atypical, she says she took a Percocet that was left over from a prescription for kidney stones and went to lie down.  This is the last thing that she describes as having happened.  Her husband reports that he went to bed last night and she was up still watching movies.  He went to bed about 1 AM.  She reports she later came to bed about 2 or 3 AM.  He reports that he got up in the morning and she was sleeping pretty soundly so he did not disturb her.  She stayed into bed until about noon.  She then came down and spent some time with her family.  Her husband reports that he and his daughter went upstairs to watch some movies and he fell asleep for a while.  He reports he went to check on his wife apparently around 6 and found her blue and completely unresponsive.  The patient denies  any regular use of narcotic pain medications.  She reports only very occasional use on as-needed basis.  When I interviewed her she did not volunteer any information about the methadone reported by EMS. Past Medical History:  Diagnosis Date  . Anemia   . Anxiety   . DEPRESSION   . GERD (gastroesophageal reflux disease)   . Hypothyroidism    Hx of, normalized TSH during pregnancy 2011  . Infertility, female   . Migraines   . Morbid obesity (HCC)    s/p RY 08/2012 - start weight 290 pounds  . Nephrolithiasis   . PCOS (polycystic ovarian syndrome)   . Pregnancy induced hypertension     Patient Active Problem List   Diagnosis Date Noted  . Opiate overdose (HCC) 05/31/2017  . Binge eating disorder 04/23/2017  . Morbid obesity (HCC) 04/23/2017  . 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 Pes planus 07/16/2016  . Hallux varus, acquired, right 03/18/2016  . Routine physical examination 08/13/2015  . Decreased attention Span 05/28/2015  . Sleep disturbance 05/28/2015  . Iron deficiency anemia 02/22/2015  . Kidney stone 08/24/2013  . Obesity (BMI 30-39.9) 04/07/2013  . 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 Sinusitis, acute 07/09/2010  . Anxiety and depression 04/10/2010  . Elevated blood pressure 04/05/2010    Past Surgical History:  Procedure Laterality  Date  . CESAREAN SECTION     x 2  . FOOT SURGERY    . 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   Procedure: LAPAROSCOPIC ROUX-EN-Y GASTRIC;  Surgeon: Atilano Ina, MD;  Location: [facility]S;  Service: General;  Laterality: N/A;  laparoscopic roux-en-y gastric bypass  . LITHOTRIPSY Left   . TONSILLECTOMY    . TUBAL LIGATION    . UPPER GI ENDOSCOPY  08/24/2012   Procedure: UPPER GI ENDOSCOPY;  Surgeon: Atilano Ina, MD;  Location: [facility]S;  Service: General;;  . WISDOM TOOTH EXTRACTION      OB History    Gravida Para Term Preterm AB Living   2 2 2  0 0 2   SAB TAB Ectopic Multiple Live Births   0 0 0 0 2        Home Medications    Prior to Admission medications   Medication Sig Start Date End Date Taking? Authorizing Provider  Acetaminophen (TYLENOL PO) Take 500 mg by mouth every 6 (six) hours as needed (pain).    Yes [provider]  clonazePAM (KLONOPIN) 0.5 MG tablet Take 1 tablet (0.5 mg total) by mouth 2 (two) times daily as needed. for anxiety 04/14/17  Yes Helane Rima, DO  DULoxetine (CYMBALTA) 60 MG capsule Take 1 capsule (60 mg total) by mouth daily. 04/09/17  Yes Evaristo Bury, NP  lisdexamfetamine (VYVANSE) 20 MG capsule Take 1 capsule (20 mg total) daily by mouth. 04/23/17  Yes Helane Rima, DO  oxyCODONE-acetaminophen (PERCOCET/ROXICET) 5-325 MG tablet Take 1 tablet by mouth every 6 (six) hours as needed for severe pain. 05/12/17  Yes McDonald, Mia A, PA-C  promethazine (PHENERGAN) 25 MG tablet Take 1 tablet (25 mg total) by mouth every 6 (six) hours as needed for nausea or vomiting. 05/12/17  Yes McDonald, Mia A, PA-C  zolpidem (AMBIEN) 10 MG tablet Take 1 tablet (10 mg total) by mouth at bedtime as needed for sleep. 04/14/17  Yes Helane Rima, DO    Family History Family History  Problem Relation Age of Onset  . Hyperlipidemia Father   . Hypertension Father   . Kidney disease Father   . Colon cancer Father 4  . Hypertension Mother   . Hypertension Maternal Grandmother   . Uterine cancer Maternal Grandmother 85  . Diabetes Maternal Grandfather     Social History Social History   Tobacco Use  . Smoking status: Never Smoker  . Smokeless tobacco: Never Used  Substance Use Topics  . Alcohol use: No    Comment: rare/socially  . Drug use: No     Allergies   Patient has no known allergies.   Review of Systems Review of Systems 10 Systems reviewed and are negative for acute change except as noted in the HPI.   Physical Exam Updated Vital Signs BP 104/69   Pulse (!) 105   Temp 98.5 F (36.9 C) (Oral)   Resp 15   Ht 5\' 4"  (1.626 m)    Wt 101.6 kg (224 lb)   SpO2 99%   BMI 38.45 kg/m   Physical Exam  Constitutional: She is oriented to person, place, and time.  Patient is awake with family members both at bedside.  Patient does not show increased work of breathing but during periods of speaking with family members, when the patient is not directly engaged, she becomes extremely relaxed and respirations dropped down with oxygen desaturation as low as high 70s with good waveform.  With stimulus the patient then  begins normal respirations and oxygen saturation returns to high 90s without any supplemental O2.  HENT:  Head: Normocephalic and atraumatic.  Mouth/Throat: Oropharynx is clear and moist.  Eyes:  Pupils are 2 mm and symmetric.  Extraocular motions intact.  Neck: Neck supple.  Cardiovascular:  Mild tachycardia.  No rub murmur gallop.  Pulmonary/Chest: Effort normal and breath sounds normal.  Abdominal: Soft. She exhibits no distension. There is no tenderness. There is no guarding.  Musculoskeletal: Normal range of motion. She exhibits no edema or tenderness.  Neurological: She is oriented to person, place, and time. No cranial nerve deficit. She exhibits normal muscle tone. Coordination normal.  Patient has appearance of normal alertness when interacting directly.  As described in constitutional, when patient is not engaged she becomes very sedated and although she is managing to keep her eyes slightly open, respirations slow and desaturation initiates.  This resolves immediately upon stimulating the patient.  Skin: Skin is warm and dry. There is pallor.  Psychiatric:  Mood is normal to flat     ED Treatments / Results  Labs (all labs ordered are listed, but only abnormal results are displayed) Labs Reviewed  COMPREHENSIVE METABOLIC PANEL - Abnormal; Notable for the following components:      Result Value   Sodium 133 (*)    Chloride 99 (*)    Glucose, Bld 338 (*)    BUN 5 (*)    Calcium 8.2 (*)    Alkaline  Phosphatase 166 (*)    All other components within normal limits  RAPID URINE DRUG SCREEN, HOSP PERFORMED - Abnormal; Notable for the following components:   Amphetamines POSITIVE (*)    All other components within normal limits  CBC WITH DIFFERENTIAL/PLATELET - Abnormal; Notable for the following components:   WBC 27.9 (*)    Neutro Abs 25.4 (*)    Monocytes Absolute 1.7 (*)    All other components within normal limits  I-STAT VENOUS BLOOD GAS, ED - Abnormal; Notable for the following components:   pO2, Ven 30.0 (*)    All other components within normal limits  HEMOGLOBIN A1C  BLOOD GAS, VENOUS  CBC  HIV ANTIBODY (ROUTINE TESTING)  CBG MONITORING, ED    EKG  EKG Interpretation None       Radiology Ct Head Wo Contrast  Result Date: 05/31/2017 CLINICAL DATA:  Patient found unresponsive after taking methadone. EXAM: CT HEAD WITHOUT CONTRAST TECHNIQUE: Contiguous axial images were obtained from the base of the skull through the vertex without intravenous contrast. COMPARISON:  None. FINDINGS: Brain: No evidence of acute infarction, hemorrhage, hydrocephalus, extra-axial collection or mass lesion/mass effect. Vascular: No hyperdense vessel or unexpected calcification. Skull: Normal. Negative for fracture or focal lesion. Sinuses/Orbits: No acute finding. Other: None IMPRESSION: Normal head CT. Electronically Signed   By: Tollie Eth M.D.   On: 05/31/2017 22:39   Dg Chest Port 1 View  Result Date: 05/31/2017 CLINICAL DATA:  Patient found unresponsive after taking methadone. EXAM: PORTABLE CHEST 1 VIEW COMPARISON:  06/13/2014 FINDINGS: The heart size and mediastinal contours are within normal limits. Both lungs are clear. The visualized skeletal structures are unremarkable. IMPRESSION: Normal chest radiograph. Electronically Signed   By: Tollie Eth M.D.   On: 05/31/2017 22:39    Procedures Procedures (including critical care time) CRITICAL CARE Performed by: Arby Barrette   Total critical care time: 30 minutes  Critical care time was exclusive of separately billable procedures and treating other patients.  Critical care was necessary  to treat or prevent imminent or life-threatening deterioration.  Critical care was time spent personally by me on the following activities: development of treatment plan with patient and/or surrogate as well as nursing, discussions with consultants, evaluation of patient's response to treatment, examination of patient, obtaining history from patient or surrogate, ordering and performing treatments and interventions, ordering and review of laboratory studies, ordering and review of radiographic studies, pulse oximetry and re-evaluation of patient's condition. Medications Ordered in ED Medications  sodium chloride 0.9 % bolus 1,000 mL (0 mLs Intravenous Stopped 05/31/17 2200)    Followed by  0.9 %  sodium chloride infusion (not administered)  acetaminophen (TYLENOL) tablet 650 mg (not administered)    Or  acetaminophen (TYLENOL) suppository 650 mg (not administered)  ondansetron (ZOFRAN) tablet 4 mg (not administered)    Or  ondansetron (ZOFRAN) injection 4 mg (not administered)  enoxaparin (LOVENOX) injection 40 mg (not administered)  naloxone (NARCAN) injection 0.4 mg (not administered)  naloxone (NARCAN) injection 1 mg (1 mg Intravenous Given 05/31/17 2130)  acetaminophen (TYLENOL) tablet 1,000 mg (1,000 mg Oral Given 05/31/17 2244)     Initial Impression / Assessment and Plan / ED Course  I have reviewed the triage vital signs and the nursing notes.  Pertinent labs & imaging results that were available during my care of the patient were reviewed by me and considered in my medical decision making (see chart for details).    Consult: Dr. Julian ReilGardner for admission. Final Clinical Impressions(s) / ED Diagnoses   Final diagnoses:  Accidental methadone overdose, initial encounter [facility](HCC)  Respiratory arrest (HCC)   Patient denies chronic methadone use or abuse.  Reportedly she took a tablet today and ended up in respiratory arrest.  EMS began bag valve mask support and compressions, reportedly the patient was cyanotic in appearance.  Fortunately they had resuscitated the patient prior to arrival to the emergency department.  Despite being functionally awake, the patient persisted however with respiratory depression and desaturation.  This resolved with administration of Narcan.  Due to long half-life of methadone and unclear usage pattern and amount of ingestion (patient reports  one 5 mg tablet, however given the severity of her event, I will have to consider that there may have been a larger amount ingested or there has been potentially sequential ingestion), the patient will require admission and monitoring to make sure she does not have rebound of respiratory depression.  ED Discharge Orders    None       Arby BarrettePfeiffer, Amire Gossen, MD 06/01/17 [phone]

## 2017-05-31 NOTE — ED Notes (Signed)
Pt going to CT at 22:17.

## 2017-05-31 NOTE — ED Notes (Signed)
CBG taken at 23:09 was 86.

## 2017-05-31 NOTE — ED Triage Notes (Signed)
Pt found unresponsive took methadone that was prescribed for father in law, weak pulses pt was bagged by fire pt then became bagged A+Ox4.

## 2017-05-31 NOTE — ED Notes (Signed)
Per nurse come back for lab work in 10 minutes.

## 2017-05-31 NOTE — H&P (Signed)
History and Physical    [name] DixonSamantha D Gouge ZOX:[id number] DOB: 1985-09-29 DOA: 05/31/2017  PCP: Helane RimaWallace, Erica, DO  Patient coming from: Home  I have personally briefly [facility]  Chief Complaint: OD  HPI: [name] [name] is a 31 y.o. female with medical history significant of anxiety, depression.  Patient brought in by EMS for apparent OD.  Apparently took fathers methadone.  Reports she hadnt ever done this before.  Arrived being bagged, woke up some.  Given narcan and mental status improved.   ED Course: Patient remains awake despite being close to 3 hours since narcan now.  But EDP not comfortable with discharge.   Review of Systems: As per HPI otherwise 10 point review of systems negative.   Past Medical History:  Diagnosis Date  . Anemia   . Anxiety   . DEPRESSION   . GERD (gastroesophageal reflux disease)   . Hypothyroidism    Hx of, normalized TSH during pregnancy 2011  . Infertility, female   . Migraines   . Morbid obesity (HCC)    s/p RY 08/2012 - start weight 290 pounds  . Nephrolithiasis   . PCOS (polycystic ovarian syndrome)   . Pregnancy induced hypertension     Past Surgical History:  Procedure Laterality Date  . CESAREAN SECTION     x 2  . FOOT SURGERY    . 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   Procedure: LAPAROSCOPIC ROUX-EN-Y GASTRIC;  Surgeon: Atilano InaEric M Wilson, MD;  Location: [facility] ORS;  Service: General;  Laterality: N/A;  laparoscopic roux-en-y gastric bypass  . LITHOTRIPSY Left   . TONSILLECTOMY    . TUBAL LIGATION    . UPPER GI ENDOSCOPY  08/24/2012   Procedure: UPPER GI ENDOSCOPY;  Surgeon: Atilano InaEric M Wilson, MD;  Location: [facility] ORS;  Service: General;;  . WISDOM TOOTH EXTRACTION       reports that  has never smoked. she has never used smokeless tobacco. She reports that she does not drink alcohol or use drugs.  No Known Allergies  Family History  Problem Relation Age of Onset  . Hyperlipidemia  Father   . Hypertension Father   . Kidney disease Father   . Colon cancer Father [id number]  . Hypertension Mother   . Hypertension Maternal Grandmother   . Uterine cancer Maternal Grandmother [id number]  . Diabetes Maternal Grandfather      Prior to Admission medications   Medication Sig Start Date End Date Taking? Authorizing Provider  Acetaminophen (TYLENOL PO) Take 500 mg by mouth every 6 (six) hours as needed (pain).    Yes [provider]  clonazePAM (KLONOPIN) 0.5 MG tablet Take 1 tablet (0.5 mg total) by mouth 2 (two) times daily as needed. for anxiety 04/14/17  Yes Helane RimaWallace, Erica, DO  DULoxetine (CYMBALTA) 60 MG capsule Take 1 capsule (60 mg total) by mouth daily. 04/09/17  Yes Evaristo BuryShambley, Ashleigh N, NP  lisdexamfetamine (VYVANSE) 20 MG capsule Take 1 capsule (20 mg total) daily by mouth. 04/23/17  Yes Helane RimaWallace, Erica, DO  oxyCODONE-acetaminophen (PERCOCET/ROXICET) 5-325 MG tablet Take 1 tablet by mouth every 6 (six) hours as needed for severe pain. 05/12/17  Yes McDonald, Mia A, PA-C  promethazine (PHENERGAN) 25 MG tablet Take 1 tablet (25 mg total) by mouth every 6 (six) hours as needed for nausea or vomiting. 05/12/17  Yes McDonald, Mia A, PA-C  zolpidem (AMBIEN) 10 MG tablet Take 1 tablet (10 mg total) by mouth at bedtime as needed for  sleep. 04/14/17  Yes Helane RimaWallace, Erica, DO    Physical Exam: Vitals:   05/31/17 2130 05/31/17 2200 05/31/17 2215 05/31/17 2315  BP: 112/75 110/79 (!) 122/91 104/69  Pulse: 98 (!) 107 (!) 105 (!) 105  Resp: 14  16 15   Temp:      TempSrc:      SpO2: 96% 97% 98% 99%  Weight:      Height:        Constitutional: NAD, calm, comfortable Eyes: PERRL, lids and conjunctivae normal ENMT: Mucous membranes are moist. Posterior pharynx clear of any exudate or lesions.Normal dentition.  Neck: normal, supple, no masses, no thyromegaly Respiratory: clear to auscultation bilaterally, no wheezing, no crackles. Normal respiratory effort. No accessory muscle use.    Cardiovascular: Regular rate and rhythm, no murmurs / rubs / gallops. No extremity edema. 2+ pedal pulses. No carotid bruits.  Abdomen: no tenderness, no masses palpated. No hepatosplenomegaly. Bowel sounds positive.  Musculoskeletal: no clubbing / cyanosis. No joint deformity upper and lower extremities. Good ROM, no contractures. Normal muscle tone.  Skin: no rashes, lesions, ulcers. No induration Neurologic: CN 2-12 grossly intact. Sensation intact, DTR normal. Strength 5/5 in all 4.  Psychiatric: Normal judgment and insight. Alert and oriented x 3. Normal mood.    Labs on Admission: I have personally reviewed following labs and imaging studies  CBC: Recent Labs  Lab 05/31/17 1930  WBC 27.9*  NEUTROABS 25.4*  HGB 13.2  HCT 39.8  MCV 88.2  PLT 303   Basic Metabolic Panel: Recent Labs  Lab 05/31/17 1930  NA 133*  K 3.6  CL 99*  CO2 23  GLUCOSE 338*  BUN 5*  CREATININE 0.89  CALCIUM 8.2*   GFR: Estimated Creatinine Clearance: 105.3 mL/min (by C-G formula based on SCr of 0.89 mg/dL). Liver Function Tests: Recent Labs  Lab 05/31/17 1930  AST 26  ALT 17  ALKPHOS 166*  BILITOT 0.5  PROT 6.8  ALBUMIN 3.9   No results for input(s): LIPASE, AMYLASE in the last 168 hours. No results for input(s): AMMONIA in the last 168 hours. Coagulation Profile: No results for input(s): INR, PROTIME in the last 168 hours. Cardiac Enzymes: No results for input(s): CKTOTAL, CKMB, CKMBINDEX, TROPONINI in the last 168 hours. BNP (last 3 results) No results for input(s): PROBNP in the last 8760 hours. HbA1C: No results for input(s): HGBA1C in the last 72 hours. CBG: Recent Labs  Lab 05/31/17 2309  GLUCAP 86   Lipid Profile: No results for input(s): CHOL, HDL, LDLCALC, TRIG, CHOLHDL, LDLDIRECT in the last 72 hours. Thyroid Function Tests: No results for input(s): TSH, T4TOTAL, FREET4, T3FREE, THYROIDAB in the last 72 hours. Anemia Panel: No results for input(s): VITAMINB12,  FOLATE, FERRITIN, TIBC, IRON, RETICCTPCT in the last 72 hours. Urine analysis:    Component Value Date/Time   COLORURINE YELLOW 05/12/2017 1805   APPEARANCEUR HAZY (A) 05/12/2017 1805   LABSPEC >1.030 (H) 05/12/2017 1805   PHURINE 6.0 05/12/2017 1805   GLUCOSEU NEGATIVE 05/12/2017 1805   GLUCOSEU NEGATIVE 08/22/2014 0831   HGBUR NEGATIVE 05/12/2017 1805   BILIRUBINUR NEGATIVE 05/12/2017 1805   KETONESUR NEGATIVE 05/12/2017 1805   PROTEINUR NEGATIVE 05/12/2017 1805   UROBILINOGEN 1.0 01/02/2015 1422   NITRITE NEGATIVE 05/12/2017 1805   LEUKOCYTESUR NEGATIVE 05/12/2017 1805    Radiological Exams on Admission: Ct Head Wo Contrast  Result Date: 05/31/2017 CLINICAL DATA:  Patient found unresponsive after taking methadone. EXAM: CT HEAD WITHOUT CONTRAST TECHNIQUE: Contiguous axial images were obtained from  the base of the skull through the vertex without intravenous contrast. COMPARISON:  None. FINDINGS: Brain: No evidence of acute infarction, hemorrhage, hydrocephalus, extra-axial collection or mass lesion/mass effect. Vascular: No hyperdense vessel or unexpected calcification. Skull: Normal. Negative for fracture or focal lesion. Sinuses/Orbits: No acute finding. Other: None IMPRESSION: Normal head CT. Electronically Signed   By: Tollie Eth M.D.   On: 05/31/2017 22:39   Dg Chest Port 1 View  Result Date: 05/31/2017 CLINICAL DATA:  Patient found unresponsive after taking methadone. EXAM: PORTABLE CHEST 1 VIEW COMPARISON:  06/13/2014 FINDINGS: The heart size and mediastinal contours are within normal limits. Both lungs are clear. The visualized skeletal structures are unremarkable. IMPRESSION: Normal chest radiograph. Electronically Signed   By: Tollie Eth M.D.   On: 05/31/2017 22:39    EKG: Independently reviewed.  Assessment/Plan Active Problems:   Opiate overdose (HCC)    1. Opiate OD - 1. Tele monitor and pulse ox 2. Narcan PRN 3. But patient wide awake, 2-3 hours post narcan  now, doubt need for further narcan likely 4. Discharge in AM if she remains alert.  DVT prophylaxis: Lovenox Code Status: Full Family Communication: Family at bedside, mother apparently not allowed to know about the methadone OD per patient. Disposition Plan: Home likely in AM Consults called: None Admission status: Place in obs   [place], Heywood Iles. DO [facility] Pager [phone]  If 7AM-7PM, please contact day team taking care of patient [email or web address] Password [facility]  05/31/2017, 11:53 PM

## 2017-06-01 ENCOUNTER — Other Ambulatory Visit: Payer: Self-pay

## 2017-06-01 DIAGNOSIS — T40601A Poisoning by unspecified narcotics, accidental (unintentional), initial encounter: Secondary | ICD-10-CM | POA: Diagnosis not present

## 2017-06-01 DIAGNOSIS — E669 Obesity, unspecified: Secondary | ICD-10-CM | POA: Diagnosis not present

## 2017-06-01 DIAGNOSIS — E282 Polycystic ovarian syndrome: Secondary | ICD-10-CM | POA: Diagnosis not present

## 2017-06-01 DIAGNOSIS — K219 Gastro-esophageal reflux disease without esophagitis: Secondary | ICD-10-CM | POA: Diagnosis not present

## 2017-06-01 DIAGNOSIS — T403X1A Poisoning by methadone, accidental (unintentional), initial encounter: Secondary | ICD-10-CM | POA: Diagnosis not present

## 2017-06-01 DIAGNOSIS — F419 Anxiety disorder, unspecified: Secondary | ICD-10-CM | POA: Diagnosis not present

## 2017-06-01 DIAGNOSIS — E039 Hypothyroidism, unspecified: Secondary | ICD-10-CM | POA: Diagnosis not present

## 2017-06-01 DIAGNOSIS — G4733 Obstructive sleep apnea (adult) (pediatric): Secondary | ICD-10-CM | POA: Diagnosis not present

## 2017-06-01 DIAGNOSIS — D509 Iron deficiency anemia, unspecified: Secondary | ICD-10-CM | POA: Diagnosis not present

## 2017-06-01 DIAGNOSIS — F329 Major depressive disorder, single episode, unspecified: Secondary | ICD-10-CM | POA: Diagnosis not present

## 2017-06-01 LAB — CBC
HCT: 36.4 % (ref 36.0–46.0)
Hemoglobin: 11.9 g/dL — ABNORMAL LOW (ref 12.0–15.0)
MCH: 28.3 pg (ref 26.0–34.0)
MCHC: 32.7 g/dL (ref 30.0–36.0)
MCV: 86.5 fL (ref 78.0–100.0)
PLATELETS: 304 10*3/uL (ref 150–400)
RBC: 4.21 MIL/uL (ref 3.87–5.11)
RDW: 13.5 % (ref 11.5–15.5)
WBC: 14.5 10*3/uL — AB (ref 4.0–10.5)

## 2017-06-01 LAB — GLUCOSE, CAPILLARY
GLUCOSE-CAPILLARY: 86 mg/dL (ref 65–99)
Glucose-Capillary: 103 mg/dL — ABNORMAL HIGH (ref 65–99)

## 2017-06-01 LAB — HIV ANTIBODY (ROUTINE TESTING W REFLEX): HIV Screen 4th Generation wRfx: NONREACTIVE

## 2017-06-01 MED ORDER — PROMETHAZINE HCL 25 MG PO TABS: 25.0000 mg | ORAL_TABLET | Freq: Four times a day (QID) | ORAL | Status: DC | PRN

## 2017-06-01 MED ORDER — ZOLPIDEM TARTRATE 5 MG PO TABS: 10.0000 mg | ORAL_TABLET | Freq: Every evening | ORAL | Status: DC | PRN

## 2017-06-01 MED ORDER — INSULIN ASPART 100 UNIT/ML ~~LOC~~ SOLN: 0.0000 [IU] | Freq: Three times a day (TID) | SUBCUTANEOUS | Status: DC

## 2017-06-01 MED ORDER — DULOXETINE HCL 60 MG PO CPEP: 60.0000 mg | ORAL_CAPSULE | Freq: Every day | ORAL | Status: DC

## 2017-06-01 MED ORDER — INSULIN ASPART 100 UNIT/ML ~~LOC~~ SOLN: 0.0000 [IU] | Freq: Every day | SUBCUTANEOUS | Status: DC

## 2017-06-01 MED ORDER — LISDEXAMFETAMINE DIMESYLATE 20 MG PO CAPS: 20.0000 mg | ORAL_CAPSULE | Freq: Every day | ORAL | Status: DC

## 2017-06-01 MED ORDER — OXYCODONE-ACETAMINOPHEN 5-325 MG PO TABS: 1.0000 | ORAL_TABLET | Freq: Four times a day (QID) | ORAL | Status: DC | PRN

## 2017-06-01 MED ORDER — CLONAZEPAM 0.5 MG PO TABS: 0.5000 mg | ORAL_TABLET | Freq: Two times a day (BID) | ORAL | Status: DC | PRN

## 2017-06-01 MED ORDER — BUTALBITAL-APAP-CAFFEINE 50-325-40 MG PO TABS
1.0000 | ORAL_TABLET | Freq: Once | ORAL | Status: AC
  Administered 2017-06-01: 1 via ORAL
  Filled 2017-06-01: qty 1

## 2017-06-01 NOTE — Plan of Care (Signed)
  Clinical Measurements: Ability to maintain clinical measurements within normal limits will improve 06/01/2017 0748 - Completed/Met by Evert Kohl, RN

## 2017-06-01 NOTE — Discharge Summary (Signed)
Physician Discharge Summary  [name] DixonSamantha D [name] UVO:[id number] DOB: June 11, 1986 DOA: 05/31/2017  PCP: Helane RimaWallace, Erica, DO  Admit date: 05/31/2017 Discharge date: 06/01/2017   Recommendations for Outpatient Follow-Up:   1. HgbA1c pending 2. outpatient sleep study 3. Patient to keep headache diary-- may need referral to neurology headache specialist   Discharge Diagnosis:   Active Problems:   Opiate overdose [facility](HCC)   Discharge disposition:  Home.  Discharge Condition: Improved.  Diet recommendation:  Carbohydrate-modified  Wound care: None.   History of Present Illness:   [name] [name] is a 31 y.o. female with medical history significant of anxiety, depression.  Patient brought in by EMS for apparent OD.  Apparently took fathers methadone.  Reports she hadnt ever done this before.  Arrived being bagged, woke up some.  Given narcan and mental status improved.     Hospital Course by Problem:   unintentional OD on methadone no SI -responded to narcan -WBC/BS reactive to stress-- trending down  Headache -? OSA -needs sleep study  Obesity Body mass index is 39.36 kg/m.  Hyperglycemia -suspect reactive to event -HgbA1c pending      Medical Consultants:    None.   Discharge Exam:   Vitals:   06/01/17 1100 06/01/17 1637  BP: 122/85 112/78  Pulse: 99 96  Resp:    Temp:    SpO2: 95% 96%   Vitals:   06/01/17 0547 06/01/17 1033 06/01/17 1100 06/01/17 1637  BP: 103/67 118/73 122/85 112/78  Pulse: 92 99 99 96  Resp: 18 18    Temp: 98.1 F (36.7 C)     TempSrc: Oral     SpO2: 93%  95% 96%  Weight:      Height:        Gen:  NAD    The results of significant diagnostics from this hospitalization (including imaging, microbiology, ancillary and laboratory) are listed below for reference.     Procedures and Diagnostic Studies:   Ct Head Wo Contrast  Result Date: 05/31/2017 CLINICAL DATA:  Patient found unresponsive after taking  methadone. EXAM: CT HEAD WITHOUT CONTRAST TECHNIQUE: Contiguous axial images were obtained from the base of the skull through the vertex without intravenous contrast. COMPARISON:  None. FINDINGS: Brain: No evidence of acute infarction, hemorrhage, hydrocephalus, extra-axial collection or mass lesion/mass effect. Vascular: No hyperdense vessel or unexpected calcification. Skull: Normal. Negative for fracture or focal lesion. Sinuses/Orbits: No acute finding. Other: None IMPRESSION: Normal head CT. Electronically Signed   By: Tollie Ethavid  Kwon M.D.   On: 05/31/2017 22:39   Dg Chest Port 1 View  Result Date: 05/31/2017 CLINICAL DATA:  Patient found unresponsive after taking methadone. EXAM: PORTABLE CHEST 1 VIEW COMPARISON:  06/13/2014 FINDINGS: The heart size and mediastinal contours are within normal limits. Both lungs are clear. The visualized skeletal structures are unremarkable. IMPRESSION: Normal chest radiograph. Electronically Signed   By: Tollie Ethavid  Kwon M.D.   On: 05/31/2017 22:39     Labs:   Basic Metabolic Panel: Recent Labs  Lab 05/31/17 1930  NA 133*  K 3.6  CL 99*  CO2 23  GLUCOSE 338*  BUN 5*  CREATININE 0.89  CALCIUM 8.2*   GFR Estimated Creatinine Clearance: 106.6 mL/min (by C-G formula based on SCr of 0.89 mg/dL). Liver Function Tests: Recent Labs  Lab 05/31/17 1930  AST 26  ALT 17  ALKPHOS 166*  BILITOT 0.5  PROT 6.8  ALBUMIN 3.9   No results for input(s): LIPASE, AMYLASE in the last 168 hours.  No results for input(s): AMMONIA in the last 168 hours. Coagulation profile No results for input(s): INR, PROTIME in the last 168 hours.  CBC: Recent Labs  Lab 05/31/17 1930 06/01/17 0152  WBC 27.9* 14.5*  NEUTROABS 25.4*  --   HGB 13.2 11.9*  HCT 39.8 36.4  MCV 88.2 86.5  PLT 303 304   Cardiac Enzymes: No results for input(s): CKTOTAL, CKMB, CKMBINDEX, TROPONINI in the last 168 hours. BNP: Invalid input(s): POCBNP CBG: Recent Labs  Lab 05/31/17 2309  06/01/17 1150 06/01/17 1635  GLUCAP 86 103* 86   D-Dimer No results for input(s): DDIMER in the last 72 hours. Hgb A1c No results for input(s): HGBA1C in the last 72 hours. Lipid Profile No results for input(s): CHOL, HDL, LDLCALC, TRIG, CHOLHDL, LDLDIRECT in the last 72 hours. Thyroid function studies No results for input(s): TSH, T4TOTAL, T3FREE, THYROIDAB in the last 72 hours.  Invalid input(s): FREET3 Anemia work up No results for input(s): VITAMINB12, FOLATE, FERRITIN, TIBC, IRON, RETICCTPCT in the last 72 hours. Microbiology No results found for this or any previous visit (from the past 240 hour(s)).   Discharge Instructions:   Discharge Instructions    Diet Carb Modified   Complete by:  As directed    Discharge instructions   Complete by:  As directed    Outpatient sleep study   Increase activity slowly   Complete by:  As directed      Allergies as of 06/01/2017   No Known Allergies     Medication List    TAKE these medications   clonazePAM 0.5 MG tablet Commonly known as:  KLONOPIN Take 1 tablet (0.5 mg total) by mouth 2 (two) times daily as needed. for anxiety   DULoxetine 60 MG capsule Commonly known as:  CYMBALTA Take 1 capsule (60 mg total) by mouth daily.   lisdexamfetamine 20 MG capsule Commonly known as:  VYVANSE Take 1 capsule (20 mg total) daily by mouth.   oxyCODONE-acetaminophen 5-325 MG tablet Commonly known as:  PERCOCET/ROXICET Take 1 tablet by mouth every 6 (six) hours as needed for severe pain.   promethazine 25 MG tablet Commonly known as:  PHENERGAN Take 1 tablet (25 mg total) by mouth every 6 (six) hours as needed for nausea or vomiting.   TYLENOL PO Take 500 mg by mouth every 6 (six) hours as needed (pain).   zolpidem 10 MG tablet Commonly known as:  AMBIEN Take 1 tablet (10 mg total) by mouth at bedtime as needed for sleep.         Time coordinating discharge: 35 min  Signed:  Joseph ArtJessica U Kameryn Davern   [facility] 06/01/2017, 5:28 PM

## 2017-06-01 NOTE — Progress Notes (Signed)
Patient refused bed alarm. Husband @ bedside. Will continue to monitor patient.

## 2017-06-01 NOTE — Progress Notes (Signed)
Pt IV discontinued, catheter intact and telemetry removed. Pt has all belongings. Pt discharge education provided at bedside with pt and pt husband. Pt discharged via wheelchair with nurse staff

## 2017-06-02 LAB — HEMOGLOBIN A1C
HEMOGLOBIN A1C: 4.8 % (ref 4.8–5.6)
MEAN PLASMA GLUCOSE: 91 mg/dL

## 2017-06-08 ENCOUNTER — Encounter: Payer: Self-pay | Admitting: Surgical

## 2017-06-08 ENCOUNTER — Encounter: Payer: Self-pay | Admitting: Family Medicine

## 2017-06-08 ENCOUNTER — Ambulatory Visit (INDEPENDENT_AMBULATORY_CARE_PROVIDER_SITE_OTHER): Payer: 59 | Admitting: Family Medicine

## 2017-06-08 VITALS — BP 122/74 | HR 100 | Temp 97.8°F | Ht 64.0 in | Wt 234.4 lb

## 2017-06-08 DIAGNOSIS — Z79899 Other long term (current) drug therapy: Secondary | ICD-10-CM | POA: Diagnosis not present

## 2017-06-08 DIAGNOSIS — F419 Anxiety disorder, unspecified: Secondary | ICD-10-CM

## 2017-06-08 DIAGNOSIS — F5081 Binge eating disorder: Secondary | ICD-10-CM

## 2017-06-08 DIAGNOSIS — G4730 Sleep apnea, unspecified: Secondary | ICD-10-CM | POA: Diagnosis not present

## 2017-06-08 DIAGNOSIS — F329 Major depressive disorder, single episode, unspecified: Secondary | ICD-10-CM

## 2017-06-08 DIAGNOSIS — F32A Depression, unspecified: Secondary | ICD-10-CM

## 2017-06-08 DIAGNOSIS — R52 Pain, unspecified: Secondary | ICD-10-CM | POA: Diagnosis not present

## 2017-06-08 LAB — CBC WITH DIFFERENTIAL/PLATELET
Basophils Absolute: 0 10*3/uL (ref 0.0–0.1)
Basophils Relative: 0.3 % (ref 0.0–3.0)
Eosinophils Absolute: 0.1 10*3/uL (ref 0.0–0.7)
Eosinophils Relative: 1 % (ref 0.0–5.0)
HCT: 41.1 % (ref 36.0–46.0)
Hemoglobin: 13.5 g/dL (ref 12.0–15.0)
Lymphocytes Relative: 29.3 % (ref 12.0–46.0)
Lymphs Abs: 2.3 10*3/uL (ref 0.7–4.0)
MCHC: 32.9 g/dL (ref 30.0–36.0)
MCV: 86.7 fl (ref 78.0–100.0)
Monocytes Absolute: 0.5 10*3/uL (ref 0.1–1.0)
Monocytes Relative: 6.8 % (ref 3.0–12.0)
Neutro Abs: 4.9 10*3/uL (ref 1.4–7.7)
Neutrophils Relative %: 62.6 % (ref 43.0–77.0)
Platelets: 301 10*3/uL (ref 150.0–400.0)
RBC: 4.75 Mil/uL (ref 3.87–5.11)
RDW: 14.1 % (ref 11.5–15.5)
WBC: 7.8 10*3/uL (ref 4.0–10.5)

## 2017-06-08 LAB — COMPREHENSIVE METABOLIC PANEL
ALT: 11 U/L (ref 0–35)
AST: 13 U/L (ref 0–37)
Albumin: 3.8 g/dL (ref 3.5–5.2)
Alkaline Phosphatase: 153 U/L — ABNORMAL HIGH (ref 39–117)
BUN: 10 mg/dL (ref 6–23)
CO2: 28 mEq/L (ref 19–32)
Calcium: 9 mg/dL (ref 8.4–10.5)
Chloride: 105 mEq/L (ref 96–112)
Creatinine, Ser: 0.54 mg/dL (ref 0.40–1.20)
GFR: 138.19 mL/min (ref >60.00)
Glucose, Bld: 78 mg/dL (ref 70–99)
Potassium: 4.3 mEq/L (ref 3.5–5.1)
Sodium: 140 mEq/L (ref 135–145)
Total Bilirubin: 0.3 mg/dL (ref 0.2–1.2)
Total Protein: 6.3 g/dL (ref 6.0–8.3)

## 2017-06-08 MED ORDER — LISDEXAMFETAMINE DIMESYLATE 20 MG PO CAPS: 20.0000 mg | ORAL_CAPSULE | Freq: Every day | ORAL | 0 refills | Status: DC

## 2017-06-08 NOTE — Progress Notes (Addendum)
[name] [name] is a 31 y.o. female is here for follow up.  History of Present Illness:   HPI: The patient was seen in the ER on 05/31/17. Her husband is here and helps to explain. Over the past month, she has transitioned and done well with Vyvanse. She self-DC'd her Cymbalta. She reports that on the day of hospitalization, she was treating a migraine that she believes was triggered by dehydration. She took an Ambien in the early am and slept for most of the day. At one point, her husband went to wake her and couldn't. "Her lips were blue." He called 911, started compressions as directed by 911, and EMT presented. EMT found her father-in-law's Methadone and had concerns that she took it. The patient and her husband adamantly deny this. A rapid urine drug screen was positive for amphetamines only, which matches her story. She was noted to apneic tendencies in the hospital - a sleep study was recommended. A reactive BG at the ER was 338. A1c was 4.8.   There are no preventive care reminders to display for this patient. Depression screen [facility] 2/9 06/08/2017 08/13/2015  Decreased Interest 3 1  Down, Depressed, Hopeless 2 1  PHQ - 2 Score 5 2  Altered sleeping 3 1  Tired, decreased energy 3 1  Change in appetite 3 1  Feeling bad or failure about yourself  3 1  Trouble concentrating 2 1  Moving slowly or fidgety/restless 3 0  Suicidal thoughts 0 0  PHQ-9 Score 22 7  Difficult doing work/chores Very difficult Not difficult at all   PMHx, SurgHx, SocialHx, FamHx, Medications, and Allergies were reviewed in the Visit Navigator and updated as appropriate.   Patient Active Problem List   Diagnosis Date Noted  . Opiate overdose (HCC) 05/31/2017  . Binge eating disorder 04/23/2017  . Morbid obesity (HCC) 04/23/2017  . 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 Pes planus 07/16/2016  . Hallux varus, acquired, right 03/18/2016  . Routine physical examination 08/13/2015  . Decreased  attention Span 05/28/2015  . Sleep disturbance 05/28/2015  . Iron deficiency anemia 02/22/2015  . Kidney stone 08/24/2013  . Obesity (BMI 30-39.9) 04/07/2013  . 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 Sinusitis, acute 07/09/2010  . Anxiety and depression 04/10/2010  . Elevated blood pressure 04/05/2010   Social History   Tobacco Use  . Smoking status: Never Smoker  . Smokeless tobacco: Never Used  Substance Use Topics  . Alcohol use: No    Comment: rare/socially  . Drug use: No   Current Medications and Allergies:   .  Acetaminophen (TYLENOL PO), Take 500 mg by mouth every 6 (six) hours as needed (pain). , Disp: , Rfl:  .  clonazePAM (KLONOPIN) 0.5 MG tablet, Take 1 tablet (0.5 mg total) by mouth 2 (two) times daily as needed. for anxiety, Disp: 60 tablet, Rfl: 1 - USING RARELY .  DULoxetine (CYMBALTA) 60 MG capsule, Take 1 capsule (60 mg total) by mouth daily., Disp: 90 capsule, Rfl: 1 - WEANED OFF .  lisdexamfetamine (VYVANSE) 20 MG capsule, Take 1 capsule (20 mg total) daily by mouth., Disp: 30 capsule, Rfl: 0 .  zolpidem (AMBIEN) 10 MG tablet, Take 1 tablet (10 mg total) by mouth at bedtime as needed for sleep., Disp: 30 tablet, Rfl: 2 - USING RARELY  No Known Allergies   Review of Systems   Pertinent items are noted in the HPI. Otherwise, ROS is negative.  Vitals:  Vitals:   06/08/17 0721  BP: 122/74  Pulse: 100  Temp: 97.8 F (36.6 C)  TempSrc: Oral  SpO2: 98%  Weight: 234 lb 6.4 oz (106.3 kg)  Height: 5\' 4"  (1.626 m)     Body mass index is 40.23 kg/m. Physical Exam:   Physical Exam  Constitutional: She is oriented to person, place, and time. She appears well-developed and well-nourished. No distress.  HENT:  Head: Normocephalic and atraumatic.  Right Ear: External ear normal.  Left Ear: External ear normal.  Nose: Nose normal.  Mouth/Throat: Oropharynx is clear and moist.  Eyes: Conjunctivae and EOM are normal. Pupils are equal, round, and  reactive to light.  Neck: Normal range of motion. Neck supple. No thyromegaly present.  Cardiovascular: Normal rate, regular rhythm, normal heart sounds and intact distal pulses.  Pulmonary/Chest: Effort normal and breath sounds normal.  Abdominal: Soft. Bowel sounds are normal.  Musculoskeletal: Normal range of motion.  Lymphadenopathy:    She has no cervical adenopathy.  Neurological: She is alert and oriented to person, place, and time.  Skin: Skin is warm and dry. Capillary refill takes less than 2 seconds.  Psychiatric: She has a normal mood and affect. Her behavior is normal.  Nursing note and vitals reviewed.   Results for orders placed or performed in visit on 06/08/17  CBC with Differential/Platelet  Result Value Ref Range   WBC 7.8 4.0 - 10.5 K/uL   RBC 4.75 3.87 - 5.11 Mil/uL   Hemoglobin 13.5 12.0 - 15.0 g/dL   HCT 16.141.1 09.636.0 - 04.546.0 %   MCV 86.7 78.0 - 100.0 fl   MCHC 32.9 30.0 - 36.0 g/dL   RDW 40.914.1 81.111.5 - 91.415.5 %   Platelets 301.0 150.0 - 400.0 K/uL   Neutrophils Relative % 62.6 43.0 - 77.0 %   Lymphocytes Relative 29.3 12.0 - 46.0 %   Monocytes Relative 6.8 3.0 - 12.0 %   Eosinophils Relative 1.0 0.0 - 5.0 %   Basophils Relative 0.3 0.0 - 3.0 %   Neutro Abs 4.9 1.4 - 7.7 K/uL   Lymphs Abs 2.3 0.7 - 4.0 K/uL   Monocytes Absolute 0.5 0.1 - 1.0 K/uL   Eosinophils Absolute 0.1 0.0 - 0.7 K/uL   Basophils Absolute 0.0 0.0 - 0.1 K/uL  Comprehensive metabolic panel  Result Value Ref Range   Sodium 140 135 - 145 mEq/L   Potassium 4.3 3.5 - 5.1 mEq/L   Chloride 105 96 - 112 mEq/L   CO2 28 19 - 32 mEq/L   Glucose, Bld 78 70 - 99 mg/dL   BUN 10 6 - 23 mg/dL   Creatinine, Ser 7.820.54 0.40 - 1.20 mg/dL   Total Bilirubin 0.3 0.2 - 1.2 mg/dL   Alkaline Phosphatase 153 (H) 39 - 117 U/L   AST 13 0 - 37 U/L   ALT 11 0 - 35 U/L   Total Protein 6.3 6.0 - 8.3 g/dL   Albumin 3.8 3.5 - 5.2 g/dL   Calcium 9.0 8.4 - 95.610.5 mg/dL   GFR 213.08138.19 >65.78>60.00 mL/min   Assessment and Plan:    [name] [name] was seen today for follow-up.  Diagnoses and all orders for this visit:  Anxiety and depression Comments: The patient felt that the Vyvanse was controlling her depression/anxiety, so she discontinued herself. We reviewed the importance of weaning and discussion with me in the future (in light of her recent complications). PHQ-9 high today. No SI/HI. We discussed restarting Cymbalta 30 mg po daily if needed -  she has that dose at home. Red flags reviewed.  Sleep-disordered breathing Comments: Concern for OSA with addition of sedatives that caused her ER visit. Will evaluate with sleep study.  Orders: -     Home sleep test  Controlled substance agreement signed Comments: Will continue Vyvanse. She rarely uses Klonopin. She has not taken Ambien since her ER visit. Will DC them from her list.  Orders: -     Pain Mgmt, Profile 8 w/Conf, U  Morbid obesity (HCC) Comments: The patient is asked to make an attempt to improve diet and exercise patterns to aid in medical management of this problem. Medication for Binge Eating as below.  Orders: -     CBC with Differential/Platelet -     Comprehensive metabolic panel -     lisdexamfetamine (VYVANSE) 20 MG capsule; Take 1 capsule (20 mg total) by mouth daily.  Binge eating disorder Comments: She reports great improvement in symptoms since starting the Vyvanse and would like to continue with the medication for now. The patient is asked to make an attempt to improve diet and exercise patterns to aid in medical management of this problem. Controlled substance agreement in place. Discussed weaning other medications to DC, see above.     . Reviewed expectations re: course of current medical issues. . Discussed self-management of symptoms. . Outlined signs and symptoms indicating need for more acute intervention. . Patient verbalized understanding and all questions were answered. Marland Kitchen. Health Maintenance issues including appropriate  healthy diet, exercise, and smoking avoidance were discussed with patient. . See orders for this visit as documented in the electronic medical record. . Patient received an After Visit Summary.  Helane RimaErica Bayan Hedstrom, DO [place], [place] [facility] 06/09/2017  Future Appointments  Date Time Provider Department Center  07/07/2017  9:00 AM Helane RimaWallace, Verita Kuroda, DO [facility]

## 2017-06-10 ENCOUNTER — Encounter: Payer: Self-pay | Admitting: Family Medicine

## 2017-06-10 ENCOUNTER — Encounter: Payer: Self-pay | Admitting: Podiatry

## 2017-06-13 LAB — PAIN MGMT, PROFILE 8 W/CONF, U
6 Acetylmorphine: NEGATIVE ng/mL (ref <10)
Alcohol Metabolites: NEGATIVE ng/mL (ref <500)
Alphahydroxyalprazolam: NEGATIVE ng/mL (ref <25)
Alphahydroxymidazolam: NEGATIVE ng/mL (ref <50)
Alphahydroxytriazolam: NEGATIVE ng/mL (ref <50)
Aminoclonazepam: 343 ng/mL — ABNORMAL HIGH (ref <25)
Amphetamine: 4986 ng/mL — ABNORMAL HIGH (ref <250)
Amphetamines: POSITIVE ng/mL — AB (ref <500)
Benzodiazepines: POSITIVE ng/mL — AB (ref <100)
Buprenorphine, Urine: NEGATIVE ng/mL (ref <5)
Cocaine Metabolite: NEGATIVE ng/mL (ref <150)
Creatinine: 196.1 mg/dL
Hydroxyethylflurazepam: NEGATIVE ng/mL (ref <50)
Lorazepam: NEGATIVE ng/mL (ref <50)
MDMA: NEGATIVE ng/mL (ref <500)
Marijuana Metabolite: NEGATIVE ng/mL (ref <20)
Methamphetamine: NEGATIVE ng/mL (ref <250)
Nordiazepam: NEGATIVE ng/mL (ref <50)
Opiates: NEGATIVE ng/mL (ref <100)
Oxazepam: NEGATIVE ng/mL (ref <50)
Oxidant: NEGATIVE ug/mL (ref <200)
Oxycodone: NEGATIVE ng/mL (ref <100)
Temazepam: NEGATIVE ng/mL (ref <50)
pH: 6.74 (ref 4.5–9.0)

## 2017-06-17 ENCOUNTER — Encounter (HOSPITAL_BASED_OUTPATIENT_CLINIC_OR_DEPARTMENT_OTHER): Payer: Self-pay | Admitting: *Deleted

## 2017-06-17 ENCOUNTER — Other Ambulatory Visit: Payer: Self-pay

## 2017-06-17 NOTE — Progress Notes (Signed)
History reviewed with Dr. Acey Lavarignan in regards to recent hospitalization and need for sleep study.  States no need for H and P.

## 2017-06-18 ENCOUNTER — Telehealth: Payer: Self-pay | Admitting: *Deleted

## 2017-06-18 MED FILL — VYVANSE 20 MG CAPSULE: 20 | 30 days supply | Qty: 30 | Fill #0

## 2017-06-18 NOTE — Telephone Encounter (Signed)
Christi - [facility] states she needs orders for pt's 06/24/2017 surgery.

## 2017-06-18 NOTE — Telephone Encounter (Signed)
Done

## 2017-06-24 ENCOUNTER — Encounter: Payer: Self-pay | Admitting: Podiatry

## 2017-06-24 ENCOUNTER — Ambulatory Visit (HOSPITAL_BASED_OUTPATIENT_CLINIC_OR_DEPARTMENT_OTHER): Payer: 59 | Admitting: Certified Registered"

## 2017-06-24 ENCOUNTER — Other Ambulatory Visit: Payer: Self-pay

## 2017-06-24 ENCOUNTER — Encounter (HOSPITAL_BASED_OUTPATIENT_CLINIC_OR_DEPARTMENT_OTHER): Payer: Self-pay | Admitting: *Deleted

## 2017-06-24 ENCOUNTER — Encounter (HOSPITAL_BASED_OUTPATIENT_CLINIC_OR_DEPARTMENT_OTHER)
Admission: RE | Disposition: A | Discharge status: Home / Self Care | Payer: Self-pay | Source: Ambulatory Visit | Attending: Podiatry

## 2017-06-24 ENCOUNTER — Ambulatory Visit (HOSPITAL_BASED_OUTPATIENT_CLINIC_OR_DEPARTMENT_OTHER)
Admission: RE | Admit: 2017-06-24 | Discharge: 2017-06-24 | Disposition: A | Discharge status: Home / Self Care | Payer: 59 | Source: Ambulatory Visit | Attending: Podiatry | Admitting: Podiatry

## 2017-06-24 DIAGNOSIS — F419 Anxiety disorder, unspecified: Secondary | ICD-10-CM | POA: Insufficient documentation

## 2017-06-24 DIAGNOSIS — M2142 Flat foot [pes planus] (acquired), left foot: Secondary | ICD-10-CM | POA: Diagnosis not present

## 2017-06-24 DIAGNOSIS — Z79899 Other long term (current) drug therapy: Secondary | ICD-10-CM | POA: Insufficient documentation

## 2017-06-24 DIAGNOSIS — Z6839 Body mass index (BMI) 39.0-39.9, adult: Secondary | ICD-10-CM | POA: Diagnosis not present

## 2017-06-24 DIAGNOSIS — E039 Hypothyroidism, unspecified: Secondary | ICD-10-CM | POA: Insufficient documentation

## 2017-06-24 DIAGNOSIS — F329 Major depressive disorder, single episode, unspecified: Secondary | ICD-10-CM | POA: Diagnosis not present

## 2017-06-24 DIAGNOSIS — K219 Gastro-esophageal reflux disease without esophagitis: Secondary | ICD-10-CM | POA: Diagnosis not present

## 2017-06-24 DIAGNOSIS — Z9884 Bariatric surgery status: Secondary | ICD-10-CM | POA: Insufficient documentation

## 2017-06-24 DIAGNOSIS — M2031 Hallux varus (acquired), right foot: Secondary | ICD-10-CM | POA: Diagnosis not present

## 2017-06-24 DIAGNOSIS — M2012 Hallux valgus (acquired), left foot: Secondary | ICD-10-CM | POA: Diagnosis not present

## 2017-06-24 DIAGNOSIS — G8918 Other acute postprocedural pain: Secondary | ICD-10-CM | POA: Diagnosis not present

## 2017-06-24 DIAGNOSIS — E282 Polycystic ovarian syndrome: Secondary | ICD-10-CM | POA: Diagnosis not present

## 2017-06-24 DIAGNOSIS — I10 Essential (primary) hypertension: Secondary | ICD-10-CM | POA: Insufficient documentation

## 2017-06-24 HISTORY — PX: AIKEN OSTEOTOMY: SHX6331

## 2017-06-24 HISTORY — DX: Other specified postprocedural states: R11.2

## 2017-06-24 HISTORY — PX: BUNIONECTOMY: SHX129

## 2017-06-24 HISTORY — DX: Nausea with vomiting, unspecified: Z98.890

## 2017-06-24 SURGERY — BUNIONECTOMY
Anesthesia: General | Site: Foot | Laterality: Left

## 2017-06-24 MED ORDER — ONDANSETRON HCL 4 MG/2ML IJ SOLN
INTRAMUSCULAR | Status: DC | PRN
  Administered 2017-06-24: 4 mg via INTRAVENOUS

## 2017-06-24 MED ORDER — OXYCODONE HCL 5 MG PO TABS
5.0000 mg | ORAL_TABLET | Freq: Once | ORAL | Status: AC | PRN
  Administered 2017-06-24: 5 mg via ORAL

## 2017-06-24 MED ORDER — MIDAZOLAM HCL 2 MG/2ML IJ SOLN
1.0000 mg | INTRAMUSCULAR | Status: DC | PRN
  Administered 2017-06-24 (×2): 2 mg via INTRAVENOUS

## 2017-06-24 MED ORDER — LIDOCAINE HCL 2 % IJ SOLN
INTRAMUSCULAR | Status: AC
  Filled 2017-06-24: qty 20

## 2017-06-24 MED ORDER — CLINDAMYCIN HCL 300 MG PO CAPS: 300.0000 mg | ORAL_CAPSULE | Freq: Three times a day (TID) | ORAL | 0 refills | Status: DC

## 2017-06-24 MED ORDER — DEXAMETHASONE SODIUM PHOSPHATE 10 MG/ML IJ SOLN
INTRAMUSCULAR | Status: AC
  Filled 2017-06-24: qty 1

## 2017-06-24 MED ORDER — FENTANYL CITRATE (PF) 100 MCG/2ML IJ SOLN
INTRAMUSCULAR | Status: AC
  Filled 2017-06-24: qty 2

## 2017-06-24 MED ORDER — SCOPOLAMINE 1 MG/3DAYS TD PT72
MEDICATED_PATCH | TRANSDERMAL | Status: AC
  Filled 2017-06-24: qty 1

## 2017-06-24 MED ORDER — BUPIVACAINE-EPINEPHRINE (PF) 0.5% -1:200000 IJ SOLN
INTRAMUSCULAR | Status: DC | PRN
  Administered 2017-06-24: 30 mL via PERINEURAL

## 2017-06-24 MED ORDER — CEFAZOLIN SODIUM-DEXTROSE 2-4 GM/100ML-% IV SOLN
2.0000 g | INTRAVENOUS | Status: AC
  Administered 2017-06-24: 2 g via INTRAVENOUS

## 2017-06-24 MED ORDER — LIDOCAINE HCL (CARDIAC) 20 MG/ML IV SOLN
INTRAVENOUS | Status: DC | PRN
  Administered 2017-06-24: 100 mg via INTRAVENOUS

## 2017-06-24 MED ORDER — CHLORHEXIDINE GLUCONATE CLOTH 2 % EX PADS: 6.0000 | MEDICATED_PAD | Freq: Once | CUTANEOUS | Status: DC

## 2017-06-24 MED ORDER — FENTANYL CITRATE (PF) 100 MCG/2ML IJ SOLN
25.0000 ug | INTRAMUSCULAR | Status: DC | PRN
  Administered 2017-06-24: 25 ug via INTRAVENOUS
  Administered 2017-06-24: 50 ug via INTRAVENOUS

## 2017-06-24 MED ORDER — FENTANYL CITRATE (PF) 100 MCG/2ML IJ SOLN
50.0000 ug | INTRAMUSCULAR | Status: AC | PRN
  Administered 2017-06-24: 25 ug via INTRAVENOUS
  Administered 2017-06-24: 50 ug via INTRAVENOUS
  Administered 2017-06-24: 25 ug via INTRAVENOUS
  Administered 2017-06-24: 50 ug via INTRAVENOUS
  Administered 2017-06-24 (×2): 25 ug via INTRAVENOUS
  Administered 2017-06-24: 50 ug via INTRAVENOUS

## 2017-06-24 MED ORDER — LACTATED RINGERS IV SOLN
INTRAVENOUS | Status: DC
  Administered 2017-06-24: 12:00:00 via INTRAVENOUS

## 2017-06-24 MED ORDER — PROPOFOL 10 MG/ML IV BOLUS
INTRAVENOUS | Status: DC | PRN
  Administered 2017-06-24: 200 mg via INTRAVENOUS

## 2017-06-24 MED ORDER — SCOPOLAMINE 1 MG/3DAYS TD PT72
1.0000 | MEDICATED_PATCH | Freq: Once | TRANSDERMAL | Status: DC | PRN
  Administered 2017-06-24: 1.5 mg via TRANSDERMAL

## 2017-06-24 MED ORDER — PROMETHAZINE HCL 25 MG/ML IJ SOLN: 6.2500 mg | INTRAMUSCULAR | Status: DC | PRN

## 2017-06-24 MED ORDER — CEFAZOLIN SODIUM-DEXTROSE 2-4 GM/100ML-% IV SOLN
INTRAVENOUS | Status: AC
  Filled 2017-06-24: qty 100

## 2017-06-24 MED ORDER — DEXAMETHASONE SODIUM PHOSPHATE 10 MG/ML IJ SOLN
INTRAMUSCULAR | Status: DC | PRN
  Administered 2017-06-24: 10 mg via INTRAVENOUS

## 2017-06-24 MED ORDER — 0.9 % SODIUM CHLORIDE (POUR BTL) OPTIME
TOPICAL | Status: DC | PRN
  Administered 2017-06-24: 400 mL

## 2017-06-24 MED ORDER — ONDANSETRON HCL 4 MG/2ML IJ SOLN
INTRAMUSCULAR | Status: AC
  Filled 2017-06-24: qty 2

## 2017-06-24 MED ORDER — BUPIVACAINE HCL (PF) 0.5 % IJ SOLN
INTRAMUSCULAR | Status: AC
  Filled 2017-06-24: qty 30

## 2017-06-24 MED ORDER — KETOROLAC TROMETHAMINE 30 MG/ML IJ SOLN
INTRAMUSCULAR | Status: AC
  Filled 2017-06-24: qty 1

## 2017-06-24 MED ORDER — MIDAZOLAM HCL 2 MG/2ML IJ SOLN
INTRAMUSCULAR | Status: AC
  Filled 2017-06-24: qty 2

## 2017-06-24 MED ORDER — PROMETHAZINE HCL 25 MG PO TABS: 25.0000 mg | ORAL_TABLET | Freq: Three times a day (TID) | ORAL | 0 refills | Status: DC | PRN

## 2017-06-24 MED ORDER — FENTANYL CITRATE (PF) 100 MCG/2ML IJ SOLN
INTRAMUSCULAR | Status: AC
Start: 2017-06-24 — End: 2017-06-24
  Filled 2017-06-24: qty 2

## 2017-06-24 MED ORDER — OXYCODONE-ACETAMINOPHEN 10-325 MG PO TABS: 1.0000 | ORAL_TABLET | Freq: Four times a day (QID) | ORAL | 0 refills | Status: DC | PRN

## 2017-06-24 MED ORDER — PROPOFOL 10 MG/ML IV BOLUS
INTRAVENOUS | Status: AC
  Filled 2017-06-24: qty 20

## 2017-06-24 MED ORDER — OXYCODONE HCL 5 MG PO TABS
ORAL_TABLET | ORAL | Status: AC
  Filled 2017-06-24: qty 1

## 2017-06-24 MED FILL — OXYCODONE-APAP 10-325 MG TA: 10-325 | 7 days supply | Qty: 30 | Fill #0

## 2017-06-24 MED FILL — CLINDAMYCIN HCL 300 MG CAPS: 300 | 7 days supply | Qty: 21 | Fill #0

## 2017-06-24 MED FILL — PROMETHAZINE 25 MG TABLET: 25 | 6 days supply | Qty: 20 | Fill #0

## 2017-06-24 SURGICAL SUPPLY — 52 items
3.0 x 18mm TC screw (Screw) ×3 IMPLANT
3.0/4.0 KWIRE ×6 IMPLANT
BANDAGE ACE 3X5.8 VEL STRL LF (GAUZE/BANDAGES/DRESSINGS) ×3 IMPLANT
BIT DRILL MEMOFIX 1.7 (BIT) ×3 IMPLANT
BLADE AVERAGE 25MMX9MM (BLADE) ×1
BLADE AVERAGE 25X9 (BLADE) ×2 IMPLANT
BLADE SURG 15 STRL LF DISP TIS (BLADE) ×2 IMPLANT
BLADE SURG 15 STRL SS (BLADE) ×4
BNDG CONFORM 2 STRL LF (GAUZE/BANDAGES/DRESSINGS) ×3 IMPLANT
BNDG ESMARK 4X9 LF (GAUZE/BANDAGES/DRESSINGS) ×3 IMPLANT
BNDG GAUZE ELAST 4 BULKY (GAUZE/BANDAGES/DRESSINGS) ×3 IMPLANT
COVER BACK TABLE 60X90IN (DRAPES) ×3 IMPLANT
CUFF TOURNIQUET SINGLE 18IN (TOURNIQUET CUFF) ×3 IMPLANT
DRAPE EXTREMITY T 121X128X90 (DRAPE) ×3 IMPLANT
DRAPE IMP U-DRAPE 54X76 (DRAPES) ×3 IMPLANT
DRAPE OEC MINIVIEW 54X84 (DRAPES) ×3 IMPLANT
DRSG EMULSION OIL 3X3 NADH (GAUZE/BANDAGES/DRESSINGS) ×3 IMPLANT
DURAPREP 26ML APPLICATOR (WOUND CARE) ×3 IMPLANT
ELECT REM PT RETURN 9FT ADLT (ELECTROSURGICAL) ×3
ELECTRODE REM PT RTRN 9FT ADLT (ELECTROSURGICAL) ×1 IMPLANT
GAUZE SPONGE 4X4 12PLY STRL (GAUZE/BANDAGES/DRESSINGS) ×3 IMPLANT
GAUZE SPONGE 4X4 16PLY XRAY LF (GAUZE/BANDAGES/DRESSINGS) IMPLANT
GLOVE BIO SURGEON STRL SZ7.5 (GLOVE) ×6 IMPLANT
GLOVE BIOGEL PI IND STRL 7.0 (GLOVE) ×2 IMPLANT
GLOVE BIOGEL PI INDICATOR 7.0 (GLOVE) ×4
GLOVE ECLIPSE 6.5 STRL STRAW (GLOVE) ×3 IMPLANT
GOWN STRL REUS W/ TWL LRG LVL3 (GOWN DISPOSABLE) ×1 IMPLANT
GOWN STRL REUS W/ TWL XL LVL3 (GOWN DISPOSABLE) ×1 IMPLANT
GOWN STRL REUS W/TWL LRG LVL3 (GOWN DISPOSABLE) ×2
GOWN STRL REUS W/TWL XL LVL3 (GOWN DISPOSABLE) ×2
NDL SAFETY ECLIPSE 18X1.5 (NEEDLE) IMPLANT
NEEDLE HYPO 18GX1.5 SHARP (NEEDLE)
NEEDLE HYPO 25X1 1.5 SAFETY (NEEDLE) ×3 IMPLANT
NS IRRIG 1000ML POUR BTL (IV SOLUTION) ×3 IMPLANT
PACK BASIN DAY SURGERY FS (CUSTOM PROCEDURE TRAY) ×3 IMPLANT
PADDING CAST ABS 4INX4YD NS (CAST SUPPLIES) ×2
PADDING CAST ABS COTTON 4X4 ST (CAST SUPPLIES) ×1 IMPLANT
PENCIL BUTTON HOLSTER BLD 10FT (ELECTRODE) ×3 IMPLANT
SLEEVE SURGEON STRL (DRAPES) ×6 IMPLANT
STAPLE MEMOFIX MS 10X10X10 (Staple) ×3 IMPLANT
STOCKINETTE 6  STRL (DRAPES) ×2
STOCKINETTE 6 STRL (DRAPES) ×1 IMPLANT
STRIP SUTURE WOUND CLOSURE 1/2 (SUTURE) IMPLANT
SUT MERSILENE 2.0 SH NDLE (SUTURE) ×3 IMPLANT
SUT MNCRL AB 3-0 PS2 18 (SUTURE) ×3 IMPLANT
SUT MNCRL AB 4-0 PS2 18 (SUTURE) ×3 IMPLANT
SUT MON AB 5-0 PS2 18 (SUTURE) ×3 IMPLANT
SUT VIC AB 2-0 SH 27 (SUTURE) ×2
SUT VIC AB 2-0 SH 27XBRD (SUTURE) ×1 IMPLANT
SYR 10ML LL (SYRINGE) IMPLANT
SYR BULB 3OZ (MISCELLANEOUS) ×3 IMPLANT
UNDERPAD 30X30 (UNDERPADS AND DIAPERS) ×3 IMPLANT

## 2017-06-24 NOTE — H&P (Signed)
[name] [name] is an 32 y.o. female.   Chief Complaint: Left HAV  HPI: [name] [name] presents to [name] [name] for surgical correction of left foot bunion deformity.She states that she would like to proceed with surgery as she has attempted conservative treatment without any improvement. She recently had right foot bunionectomy and she state states that she is doing well without pain. She has no other concerns today.   Past Medical History:  Diagnosis Date  . Anemia   . Anxiety   . DEPRESSION   . GERD (gastroesophageal reflux disease)   . Hypothyroidism    Hx of, normalized TSH during pregnancy 2011  . Infertility, female   . Migraines   . Morbid obesity (HCC)    s/p RY 08/2012 - start weight 290 pounds  . Nephrolithiasis   . PCOS (polycystic ovarian syndrome)   . PONV (postoperative nausea and vomiting)   . Pregnancy induced hypertension     Past Surgical History:  Procedure Laterality Date  . CESAREAN SECTION     x 2  . FOOT SURGERY    . 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   Procedure: LAPAROSCOPIC ROUX-EN-Y GASTRIC;  Surgeon: Atilano InaEric M Wilson, MD;  Location: [facility];  Service: General;  Laterality: N/A;  laparoscopic roux-en-y gastric bypass  . LITHOTRIPSY Left   . TONSILLECTOMY    . TUBAL LIGATION    . UPPER GI ENDOSCOPY  08/24/2012   Procedure: UPPER GI ENDOSCOPY;  Surgeon: Atilano InaEric M Wilson, MD;  Location: [facility];  Service: General;;  . WISDOM TOOTH EXTRACTION      Family History  Problem Relation Age of Onset  . Hyperlipidemia Father   . Hypertension Father   . Kidney disease Father   . Colon cancer Father [id number]  . Hypertension Mother   . Hypertension Maternal Grandmother   . Uterine cancer Maternal Grandmother [id number]  . Diabetes Maternal Grandfather    Social History:  reports that  has never smoked. she has never used smokeless tobacco. She reports that she does not drink alcohol or use drugs.  Allergies: No Known Allergies  Medications Prior to Admission  Medication Sig  Dispense Refill  . Acetaminophen (TYLENOL PO) Take 500 mg by mouth every 6 (six) hours as needed (pain).     . clonazePAM (KLONOPIN) 0.5 MG tablet Take 1 tablet (0.5 mg total) by mouth 2 (two) times daily as needed. for anxiety 60 tablet 1  . lisdexamfetamine (VYVANSE) 20 MG capsule Take 1 capsule (20 mg total) by mouth daily. 30 capsule 0  . zolpidem (AMBIEN) 5 MG tablet Take 5 mg by mouth at bedtime as needed for sleep.      No results found for this or any previous visit (from the past 48 hour(s)). No results found.  ROS  Blood pressure 128/83, pulse 92, temperature 98.9 F (37.2 C), temperature source Oral, resp. rate 15, height 5\' 4"  (1.626 m), weight 229 lb 12.8 oz (104.2 kg), last menstrual period 04/01/2017, SpO2 100 %. Physical Exam  General: AAO x3, NAD  Dermatological: There are no open sores, no preulcerative lesions, no rash or signs of infection present.  Vascular: Dorsalis Pedis artery and Posterior Tibial artery pedal pulses are 2/4 bilateral with immedate capillary fill time.  There is no pain with calf compression, swelling, warmth, erythema.   Neruologic: Grossly intact via light touch bilateral.   Musculoskeletal: Mild to moderate HAV is present on the left foot with tenderness over the bunion. No crepitance of ROM of the  1st MTPJ. No hypermobility. No other areas of tenderness   Assessment/Plan Left HAV -Treatment options discussed including all alternatives, risks, and complications -Again discussed surgical and conservative treatment and she wishes to proceed with the surgery. Reviewed the surgery and postop course. No promises or guarantees given. No further questions.  -NPO -Discussed pain meds postop -Surgical consent was reviewed.  -Continue with surgery as planned.   [name], DPM  [name], DPM 06/24/2017, 12:39 PM

## 2017-06-24 NOTE — Brief Op Note (Signed)
06/24/2017  2:07 PM  PATIENT:  [name] [name]  32 y.o. female  PRE-OPERATIVE DIAGNOSIS:  Hallus abducto valgus  POST-OPERATIVE DIAGNOSIS:  Hallus abducto valgus  PROCEDURE:  Procedure(s): ALSTON BUNIONECTOMY (Left) Quintella ReichertAIKEN OSTEOTOMY (Left)  SURGEON:  Surgeon(s) and Role:    * Vivi BarrackWagoner, Shaun Runyon R, DPM - Primary  PHYSICIAN ASSISTANT:   ASSISTANTS: none   ANESTHESIA:   Popliteal block by anesthesia   EBL:  none   BLOOD ADMINISTERED:none  DRAINS: none   LOCAL MEDICATIONS USED:  NONE  SPECIMEN:  No Specimen  DISPOSITION OF SPECIMEN:  N/A  COUNTS:  YES  TOURNIQUET:   Total Tourniquet Time Documented: Calf (Left) - 55 minutes Total: Calf (Left) - 55 minutes   DICTATION: .Reubin Milanragon Dictation  PLAN OF CARE: Discharge to home after PACU  PATIENT DISPOSITION:  PACU - hemodynamically stable.   Delay start of Pharmacological VTE agent (>24hrs) due to surgical blood loss or risk of bleeding: not applicable

## 2017-06-24 NOTE — Anesthesia Procedure Notes (Signed)
Procedure Name: LMA Insertion Performed by: Kaye Mitro M, CRNA Pre-anesthesia Checklist: Patient identified, Emergency Drugs available, Suction available, Patient being monitored and Timeout performed Preoxygenation: Pre-oxygenation with 100% oxygen Induction Type: IV induction LMA: LMA inserted LMA Size: 4.0 Tube type: Oral Number of attempts: 1 Placement Confirmation: positive ETCO2,  CO2 detector and breath sounds checked- equal and bilateral Tube secured with: Tape Dental Injury: Teeth and Oropharynx as per pre-operative assessment        

## 2017-06-24 NOTE — Discharge Instructions (Signed)
See written instructions Ice/elevate Wear surgical boot at all times; limit weightbearing Resume home medications as taking before surgery. 5mg  OXYCODONE given at 3:00pm! Percocet as needed for pain. Phenergan as needed for nausea/vomiting. The clindamycin is to help prevent infection.  I will see you next week. If you need anything give me a call at [phone].  Hope you get better soon!   Post Anesthesia Home Care Instructions  Activity: Get plenty of rest for the remainder of the day. A responsible individual must stay with you for 24 hours following the procedure.  For the next 24 hours, DO NOT: -Drive a car -Advertising copywriterperate machinery -Drink alcoholic beverages -Take any medication unless instructed by your physician -Make any legal decisions or sign important papers.  Meals: Start with liquid foods such as gelatin or soup. Progress to regular foods as tolerated. Avoid greasy, spicy, heavy foods. If nausea and/or vomiting occur, drink only clear liquids until the nausea and/or vomiting subsides. Call your physician if vomiting continues.  Special Instructions/Symptoms: Your throat may feel dry or sore from the anesthesia or the breathing tube placed in your throat during surgery. If this causes discomfort, gargle with warm salt water. The discomfort should disappear within 24 hours.  If you had a scopolamine patch placed behind your ear for the management of post- operative nausea and/or vomiting:  1. The medication in the patch is effective for 72 hours, after which it should be removed.  Wrap patch in a tissue and discard in the trash. Wash hands thoroughly with soap and water. 2. You may remove the patch earlier than 72 hours if you experience unpleasant side effects which may include dry mouth, dizziness or visual disturbances. 3. Avoid touching the patch. Wash your hands with soap and water after contact with the patch.   Regional Anesthesia Blocks  1. Numbness or the inability to  move the "blocked" extremity may last from 3-48 hours after placement. The length of time depends on the medication injected and your individual response to the medication. If the numbness is not going away after 48 hours, call your surgeon.  2. The extremity that is blocked will need to be protected until the numbness is gone and the  Strength has returned. Because you cannot feel it, you will need to take extra care to avoid injury. Because it may be weak, you may have difficulty moving it or using it. You may not know what position it is in without looking at it while the block is in effect.  3. For blocks in the legs and feet, returning to weight bearing and walking needs to be done carefully. You will need to wait until the numbness is entirely gone and the strength has returned. You should be able to move your leg and foot normally before you try and bear weight or walk. You will need someone to be with you when you first try to ensure you do not fall and possibly risk injury.  4. Bruising and tenderness at the needle site are common side effects and will resolve in a few days.  5. Persistent numbness or new problems with movement should be communicated to the surgeon or the [facility] [phone])/ [facility] [phone]).

## 2017-06-24 NOTE — Anesthesia Postprocedure Evaluation (Signed)
Anesthesia Post Note  Patient: [name] [name]  Procedure(s) Performed: Anthoney HaradaALSTON BUNIONECTOMY (Left Foot) AIKEN OSTEOTOMY (Left Foot)     Patient location during evaluation: PACU Anesthesia Type: General Level of consciousness: awake and alert Pain management: pain level controlled Vital Signs Assessment: post-procedure vital signs reviewed and stable Respiratory status: spontaneous breathing, nonlabored ventilation and respiratory function stable Cardiovascular status: blood pressure returned to baseline and stable Postop Assessment: no apparent nausea or vomiting Anesthetic complications: no    Last Vitals:  Vitals:   06/24/17 1500 06/24/17 1523  BP: 125/85 135/79  Pulse: 97 97  Resp: 16 16  Temp:  36.7 C  SpO2: 100% 99%    Last Pain:  Vitals:   06/24/17 1523  TempSrc:   PainSc: 4                  Cecile HearingStephen Edward Geza Beranek

## 2017-06-24 NOTE — Progress Notes (Signed)
Assisted Dr. Turk with left, ultrasound guided, popliteal/saphenous block. Side rails up, monitors on throughout procedure. See vital signs in flow sheet. Tolerated Procedure well. 

## 2017-06-24 NOTE — H&P (Signed)
Anesthesia H&P Update: History and Physical Exam reviewed; patient is OK for planned anesthetic and procedure. ? ?

## 2017-06-24 NOTE — Transfer of Care (Signed)
Immediate Anesthesia Transfer of Care Note  Patient: Josie DixonSamantha D Paddack  Procedure(s) Performed: Anthoney HaradaALSTON BUNIONECTOMY (Left Foot) Quintella ReichertAIKEN OSTEOTOMY (Left Foot)  Patient Location: PACU  Anesthesia Type:GA combined with regional for post-op pain  Level of Consciousness: awake, sedated and patient cooperative  Airway & Oxygen Therapy: Patient Spontanous Breathing and Patient connected to face mask oxygen  Post-op Assessment: Report given to RN and Post -op Vital signs reviewed and stable  Post vital signs: Reviewed and stable  Last Vitals:  Vitals:   06/24/17 1225 06/24/17 1230  BP: 130/90 128/83  Pulse: 72 92  Resp: 10 15  Temp:    SpO2: 100% 100%    Last Pain:  Vitals:   06/24/17 1148  TempSrc: Oral  PainSc: 0-No pain         Complications: No apparent anesthesia complications

## 2017-06-24 NOTE — Anesthesia Procedure Notes (Signed)
Anesthesia Regional Block: Popliteal block   Pre-Anesthetic Checklist: ,, timeout performed, Correct Patient, Correct Site, Correct Laterality, Correct Procedure, Correct Position, site marked, Risks and benefits discussed,  Surgical consent,  Pre-op evaluation,  At surgeon's request and post-op pain management  Laterality: Left  Prep: chloraprep       Needles:  Injection technique: Single-shot  Needle Type: Echogenic Needle     Needle Length: 9cm  Needle Gauge: 21     Additional Needles:   Procedures:,,,, ultrasound used (permanent image in chart),,,,  Narrative:  Start time: 06/24/2017 12:23 PM End time: 06/24/2017 12:30 PM Injection made incrementally with aspirations every 5 mL.  Performed by: Personally  Anesthesiologist: Cecile Hearingurk, Rovena Hearld Edward, MD  Additional Notes: No pain on injection. No increased resistance to injection. Injection made in 5cc increments.  Good needle visualization.  Patient tolerated procedure well.  Combined LEFT popliteal/saphenous nerve block.

## 2017-06-24 NOTE — Anesthesia Preprocedure Evaluation (Signed)
Anesthesia Evaluation  Patient identified by MRN, date of birth, ID band Patient awake    Reviewed: Allergy & Precautions, NPO status , Patient's Chart, lab work & pertinent test results  History of Anesthesia Complications (+) PONV and history of anesthetic complications  Airway Mallampati: II  TM Distance: >3 FB Neck ROM: Full    Dental  (+) Teeth Intact, Dental Advisory Given   Pulmonary neg pulmonary ROS,    Pulmonary exam normal breath sounds clear to auscultation       Cardiovascular hypertension, Normal cardiovascular exam Rhythm:Regular Rate:Normal     Neuro/Psych  Headaches, PSYCHIATRIC DISORDERS Anxiety Depression    GI/Hepatic Neg liver ROS, GERD  ,S/p gastric bypass   Endo/Other  Hypothyroidism Morbid obesity  Renal/GU negative Renal ROS     Musculoskeletal negative musculoskeletal ROS (+)   Abdominal   Peds  (+) ADHD Hematology  (+) Blood dyscrasia, anemia ,   Anesthesia Other Findings Day of surgery medications reviewed with the patient.  Reproductive/Obstetrics                             Anesthesia Physical Anesthesia Plan  ASA: II  Anesthesia Plan: General   Post-op Pain Management:  Regional for Post-op pain   Induction: Intravenous  PONV Risk Score and Plan: 4 or greater and Dexamethasone and Ondansetron  Airway Management Planned: LMA  Additional Equipment:   Intra-op Plan:   Post-operative Plan: Extubation in OR  Informed Consent: I have reviewed the patients History and Physical, chart, labs and discussed the procedure including the risks, benefits and alternatives for the proposed anesthesia with the patient or authorized representative who has indicated his/her understanding and acceptance.   Dental advisory given  Plan Discussed with: CRNA  Anesthesia Plan Comments: (Risks/benefits of general anesthesia discussed with patient including risk of  damage to teeth, lips, gum, and tongue, nausea/vomiting, allergic reactions to medications, and the possibility of heart attack, stroke and death.  All patient questions answered.  Patient wishes to proceed.)        Anesthesia Quick Evaluation

## 2017-06-25 ENCOUNTER — Telehealth: Payer: Self-pay | Admitting: *Deleted

## 2017-06-25 ENCOUNTER — Encounter (HOSPITAL_BASED_OUTPATIENT_CLINIC_OR_DEPARTMENT_OTHER): Payer: Self-pay | Admitting: Podiatry

## 2017-06-25 NOTE — Op Note (Signed)
Surgeon: Elsie Ra, D.P.M. Assistants: None Pre-operative diagnosis: Left HAV, left hallux interphalangeus Post-operative diagnosis: same Procedure: Left Joylene Grapes bunionectomy Pathology: Specimen: None Pertinent Intra-op findings: See below Anesthesia: General with popliteal block Hemostasis: Pneumatic ankle tourniquet 250 mmHg EBL: None Materials: Integra 3.0 mm screw, Integra 10 mm staple Injectables: none  Complications: none  Indications for surgery: Ms. Vandevelde presents to the office with concerns of a painful bunion to the left foot.  She previously underwent a Lapidus bunionectomy on the right side and did well.  The deforming the left side was not a significant and her pain was not is severe compared to the contralateral extremity.  We discussed treatment options for her.  She did not want to undergo the same procedure she had on the right foot from a Lapidus.  We discussed an Joylene Grapes bunionectomy.  We discussed the surgery as well as the postoperative course and she wishes to proceed with surgery.  She is attempted conservative treatment in the left side including shoe changes, offloading, padding, injections, orthotics without complete resolution. discussed with the patient detail. No promises or guarantees were given as to the outcome of the procedure and all questions were answered to best of my ability.   Procedure in detail: The patient was both verbally and visually identified by myself, the nursing staff, and anesthesia staff in the preoperative holding area. They were then transferred to the operating room and placed on the operative table in supine position.  A well-padded ankle tourniquet was applied to the left lower extremity.  Left lower extremity was then scrubbed, prepped, draped in the normal sterile fashion.    Attention was directed to the dorsal medial aspect of the patient's left foot on the bunion deformity which is a linear incision was made medial  to the extensor tendon with a 15 blade scalpel to the epidermis and dermis.  The subcutaneous tissues were then bluntly and sharply dissected making sure to retract all vital neurovascular structures and all bleeders were cauterized as necessary.  Next attention was then directed into the first interspace in which of lateral release was performed.  The deep transverse intermetatarsal ligament was then incised followed by the abductor tendon and the fibular sesamoidal ligament.  There is significant improvement first MPJ range of motion.  Next a capsular incision was made in line with the skin incision medial to the extensor tendon.  Soft tissue structures were freed from the first metatarsal head of the operative site.  Next a sagittal bone saw was utilized to resect the medial eminence of the first metatarsal head.  Next sagittal bone saw was utilized to make an osteotomy in the first metatarsal head with an Eliberto Ivory type bunionectomy.  Once the osteotomy was created the capital fragment was distracted and shifted laterally into a corrected position.  A guidewire for an integrity 3.0 millimeters screw was inserted across the osteotomy site.  Secondary wire was used for temporary fixation.  Fluoroscopy was utilized to confirm that the fragment had shifted laterally.  Clinically the bunion bump was reduced.  Next an Integra 3.0 millimeter screw was inserted to adequate compression across the osteotomy site.  Temporary fixation was removed and the osteotomy was stable.  Next visit the medial overhanging bone was then resected.  The osteotomy remained stable.  The first metatarsal head was contoured.  The bump on the bunion was reduced however the toe was still sitting in abducted position.  Therefore the incision was then carried distally in  a layered fashion exposing the proximal phalanx.  Next a osteotomy was created in the proximal phalanx of the base medially in order to perform an Akin osteotomy.  Once this was  created by The lateral hinge intact.  The toe was placed into better position.  Next an Integra 10 mm staple was then inserted under standard technique across the ostomy site.  Once this was intact the ostomy was stable and the deformity was reduced.  At this time the incision was covered the irrigated with sterile saline and hemostasis was achieved.  Next a medial capsulorrhaphy was then performed and the capsule was then closed with 2-0 Vicryl.  The subcu tissue was then closed with Monocryl and the skin was then closed with Monocryl in a running subcuticular fashion.  At this time the toe was sitting in a rectus position and there was adequate range of motion of the first MTPJ.  Fluoroscopy was also utilized to confirm there was reduction.  Clinically the toe was not much more rectus position as well.  At this time Adaptic was placed over the incision followed by dry sterile dressing.  The tourniquet was released and there is found to be an immediate capillary refill time to all the digits.  She is walking and anesthesia and found to tolerate the procedure well any complications.  She was transferred to PACU with vital signs stable and vascular status intact.  Postop course: Patient is to be in cam boot at all times and limit weightbearing but she can be weightbearing as tolerated.  Postop instructions were also provided as well as contact information for the office should she need this.  Ovid CurdMatthew Wagoner, DPM

## 2017-06-25 NOTE — Telephone Encounter (Signed)
Called patient and left a message that I was checking on her after the surgery yesterday and to call the [facility] office if there is any concerns and questions [phone]. Emannuel Vise

## 2017-06-26 ENCOUNTER — Telehealth: Payer: Self-pay | Admitting: Podiatry

## 2017-06-26 NOTE — Telephone Encounter (Signed)
I called [name] [name] see how she was doing.  She says the pain started this morning after the nerve block wore off but she is doing well and the pain medicine controls her pain.  She has no fevers or chills and she has no concerns or questions.  [name]

## 2017-06-29 ENCOUNTER — Ambulatory Visit (INDEPENDENT_AMBULATORY_CARE_PROVIDER_SITE_OTHER): Payer: 59 | Admitting: Podiatry

## 2017-06-29 ENCOUNTER — Ambulatory Visit (INDEPENDENT_AMBULATORY_CARE_PROVIDER_SITE_OTHER): Payer: 59

## 2017-06-29 DIAGNOSIS — M2012 Hallux valgus (acquired), left foot: Secondary | ICD-10-CM

## 2017-06-29 MED ORDER — OXYCODONE-ACETAMINOPHEN 5-325 MG PO TABS: 1.0000 | ORAL_TABLET | Freq: Four times a day (QID) | ORAL | 0 refills | Status: DC | PRN

## 2017-06-29 MED ORDER — PROMETHAZINE HCL 25 MG PO TABS: 25.0000 mg | ORAL_TABLET | Freq: Three times a day (TID) | ORAL | 0 refills | Status: DC | PRN

## 2017-06-29 MED FILL — PROMETHAZINE 25 MG TABLET: 25 | 6 days supply | Qty: 20 | Fill #0

## 2017-06-29 MED FILL — OXYCOD/ACETAMINOPHEN 5-325M: 5-325 | 2 days supply | Qty: 20 | Fill #0

## 2017-06-30 NOTE — Progress Notes (Signed)
Subjective: [name] [name] is a 32 y.o. is seen today in office s/p LEFT Austin/Akin bunionectomy preformed on 06/24/2017.  She states that she is doing well and her pain is better controlled.  She is out of the pain medicine asking for refill or any decrease the strength.  She has been wearing the cam boot and weightbearing will try to keep the foot elevated as much as possible.  She denies any recent injury or trauma.  She has no questions or any concerns today.. Denies any systemic complaints such as fevers, chills, nausea, vomiting. No calf pain, chest pain, shortness of breath.   Objective: General: No acute distress, AAOx3  DP/PT pulses palpable 2/4, CRT < 3 sec to all digits.  Protective sensation intact. Motor function intact.  Left foot: Incision is well coapted without any evidence of dehiscence and sutures are intact. There is no surrounding erythema, ascending cellulitis, fluctuance, crepitus, malodor, drainage/purulence. There is mild edema around the surgical site. There is mild pain along the surgical site, most with MPJ range of motion.  There is bruising to the digits although minimal.  Incision appears to be healing well.  The toe is in rectus position No other areas of tenderness to bilateral lower extremities.  No other open lesions or pre-ulcerative lesions.  No pain with calf compression, swelling, warmth, erythema.   Assessment and Plan:  Status post left Austin, Akin bunionectomy, doing well with no complications   -Treatment options discussed including all alternatives, risks, and complications -X-rays were obtained and reviewed.  The toes and rectus position status post Joylene GrapesAustin, Akin bunionectomy with hardware intact.  No evidence of acute fracture. -Antibiotic ointment was applied followed by a bandage.  Keep the dressing clean, dry, intact. -Continue cam boot and limited weightbearing. -Ice/elevation -Pain medication as needed-prescribed Percocet 5/325.  After this  prescription will likely go to Vicodin. -Monitor for any clinical signs or symptoms of infection and DVT/PE and directed to call the office immediately should any occur or go to the ER. -Follow-up as scheduled or sooner if any problems arise. In the meantime, encouraged to call the office with any questions, concerns, change in symptoms.   Ovid CurdMatthew Leanda Padmore, DPM

## 2017-07-01 ENCOUNTER — Telehealth: Payer: Self-pay | Admitting: Podiatry

## 2017-07-01 ENCOUNTER — Other Ambulatory Visit: Payer: Self-pay | Admitting: Podiatry

## 2017-07-01 MED ORDER — OXYCODONE-ACETAMINOPHEN 5-325 MG PO TABS: 1.0000 | ORAL_TABLET | Freq: Four times a day (QID) | ORAL | 0 refills | Status: DC | PRN

## 2017-07-01 MED FILL — OXYCOD/ACETAMINOPHEN 5-325M: 5-325 | 3 days supply | Qty: 20 | Fill #0

## 2017-07-01 NOTE — Telephone Encounter (Signed)
I [facility] pt [facility].

## 2017-07-01 NOTE — Telephone Encounter (Signed)
I spoke with pt and she states she is getting some relief now, and she knows Dr. Ardelle AntonWagoner and she had discuss going to a vicodin. I told pt I would inform Dr. Ardelle AntonWagoner and call again. Pt states understanding.

## 2017-07-01 NOTE — Telephone Encounter (Signed)
I saw Dr. Ardelle AntonWagoner on Monday for my post-op appointment and he gave me a new prescription for percocet 5 and told me I could take 1-2. I've been having to take 2 because the pain was not going away after it had been wrapped, touched, and messed with at the appointment. Now I only have enough to get me through tonight. I was wondering if I could get something called in or I come pick up a prescription that I can get filled tonight so I can have it for tomorrow morning. My number is [phone] and the pharmacy [facility]. Please just give me a call back. Thanks.

## 2017-07-01 NOTE — Telephone Encounter (Signed)
I sent it to her pharmacy

## 2017-07-01 NOTE — Progress Notes (Signed)
Refilled percocet Will switch to viocdin next

## 2017-07-03 ENCOUNTER — Telehealth: Payer: Self-pay | Admitting: Podiatry

## 2017-07-03 ENCOUNTER — Other Ambulatory Visit: Payer: Self-pay | Admitting: Podiatry

## 2017-07-03 ENCOUNTER — Encounter: Payer: Self-pay | Admitting: Podiatry

## 2017-07-03 MED ORDER — HYDROCODONE-ACETAMINOPHEN 5-325 MG PO TABS: 1.0000 | ORAL_TABLET | ORAL | 0 refills | Status: DC | PRN

## 2017-07-03 MED FILL — HYDROCODON-APAP 5-325: 5-325 | 3 days supply | Qty: 20 | Fill #0

## 2017-07-03 NOTE — Telephone Encounter (Signed)
I was calling to see if I could get Vicodin instead of Percocet as the Percocet is still making me too tired even taking 1 tablet. I'm not able to like function during the day and there is some stuff that I need to get done while I'm on medical leave. So if I could get a prescription for Vicodin that would be great. My phone number is [phone]. Um just give me a call back. Thanks.

## 2017-07-03 NOTE — Progress Notes (Signed)
Sent vicodin to her pharmacy

## 2017-07-03 NOTE — Telephone Encounter (Signed)
I informed pt of Dr. Wagoner's orders. 

## 2017-07-06 ENCOUNTER — Ambulatory Visit (INDEPENDENT_AMBULATORY_CARE_PROVIDER_SITE_OTHER): Payer: 59 | Admitting: Podiatry

## 2017-07-06 DIAGNOSIS — M2012 Hallux valgus (acquired), left foot: Secondary | ICD-10-CM

## 2017-07-06 MED ORDER — HYDROCODONE-ACETAMINOPHEN 5-325 MG PO TABS: 1.0000 | ORAL_TABLET | Freq: Four times a day (QID) | ORAL | 0 refills | Status: DC | PRN

## 2017-07-06 MED FILL — HYDROCODON-APAP 5-325: 5-325 | 5 days supply | Qty: 20 | Fill #0

## 2017-07-07 ENCOUNTER — Ambulatory Visit: Payer: 59 | Admitting: Family Medicine

## 2017-07-07 NOTE — Progress Notes (Signed)
Subjective: [name] DixonSamantha D Thueson is a 32 y.o. is seen today in office s/p LEFT Austin/Akin bunionectomy preformed on 06/24/2017.  She states that she is doing well.  She is using Vicodin 5 mg as well for her she is asking for refill today and her Pain is controlled with this and she is been in the cam boot.  His little sore today but she is on her feet more recently.  She is been trying ice elevate as much as possible but not regularly.  She has no further questions or concerns.  Denies any systemic complaints such as fevers, chills, nausea, vomiting. No calf pain, chest pain, shortness of breath.   Objective: General: No acute distress, AAOx3  DP/PT pulses palpable 2/4, CRT < 3 sec to all digits.  Protective sensation intact. Motor function intact.  Left foot: Incision is well coapted without any evidence of dehiscence and sutures are intact. There is no surrounding erythema, ascending cellulitis, fluctuance, crepitus, malodor, drainage/purulence. There is minimal edema around the surgical site. There is no significant pain along the surgical site.  Mild decreased range of motion first MPJ.  There is bruising to the digits although minimal and this appears to be improving.  Incision appears to be healing well.  The toe is in rectus position No other areas of tenderness to bilateral lower extremities.  No other open lesions or pre-ulcerative lesions.  No pain with calf compression, swelling, warmth, erythema.   Assessment and Plan:  Status post left Austin, Akin bunionectomy, doing well with no complications   -Treatment options discussed including all alternatives, risks, and complications -Incision is healing well.  I left the suture ends intact today.  Antibiotic ointment was applied followed by a bandage.  Keep the dressing clean, dry, intact.  She can start to shower on Saturday and apply antibiotic ointment to the area.  Watch the incision closely. -Continue cam boot at all times and limit  weightbearing -Ice/elevation -Pain medication as needed-prescribed Vicodin 5/325 -Monitor for any clinical signs or symptoms of infection and DVT/PE and directed to call the office immediately should any occur or go to the ER. -Follow-up as scheduled or sooner if any problems arise. In the meantime, encouraged to call the office with any questions, concerns, change in symptoms.   Ovid CurdMatthew Wagoner, DPM

## 2017-07-09 ENCOUNTER — Ambulatory Visit (INDEPENDENT_AMBULATORY_CARE_PROVIDER_SITE_OTHER): Payer: 59 | Admitting: Family Medicine

## 2017-07-09 ENCOUNTER — Encounter: Payer: Self-pay | Admitting: Family Medicine

## 2017-07-09 VITALS — HR 102 | Temp 98.3°F | Ht 64.0 in | Wt 231.6 lb

## 2017-07-09 DIAGNOSIS — G4730 Sleep apnea, unspecified: Secondary | ICD-10-CM

## 2017-07-09 DIAGNOSIS — F419 Anxiety disorder, unspecified: Secondary | ICD-10-CM | POA: Diagnosis not present

## 2017-07-09 DIAGNOSIS — R6882 Decreased libido: Secondary | ICD-10-CM | POA: Insufficient documentation

## 2017-07-09 DIAGNOSIS — F5081 Binge eating disorder: Secondary | ICD-10-CM | POA: Diagnosis not present

## 2017-07-09 DIAGNOSIS — F329 Major depressive disorder, single episode, unspecified: Secondary | ICD-10-CM

## 2017-07-09 DIAGNOSIS — M79672 Pain in left foot: Secondary | ICD-10-CM | POA: Diagnosis not present

## 2017-07-09 DIAGNOSIS — N946 Dysmenorrhea, unspecified: Secondary | ICD-10-CM | POA: Insufficient documentation

## 2017-07-09 DIAGNOSIS — Z98891 History of uterine scar from previous surgery: Secondary | ICD-10-CM | POA: Insufficient documentation

## 2017-07-09 DIAGNOSIS — N920 Excessive and frequent menstruation with regular cycle: Secondary | ICD-10-CM | POA: Insufficient documentation

## 2017-07-09 DIAGNOSIS — F32A Depression, unspecified: Secondary | ICD-10-CM | POA: Insufficient documentation

## 2017-07-09 MED ORDER — MELOXICAM 15 MG PO TABS: 15.0000 mg | ORAL_TABLET | Freq: Every day | ORAL | 1 refills | Status: DC

## 2017-07-09 MED ORDER — LISDEXAMFETAMINE DIMESYLATE 30 MG PO CAPS: 30.0000 mg | ORAL_CAPSULE | Freq: Every day | ORAL | 0 refills | Status: DC

## 2017-07-09 MED ORDER — OXYCODONE-ACETAMINOPHEN 10-325 MG PO TABS: 1.0000 | ORAL_TABLET | Freq: Four times a day (QID) | ORAL | 0 refills | Status: DC | PRN

## 2017-07-09 MED FILL — VYVANSE 30 MG CAPSULE: 30 | 30 days supply | Qty: 30 | Fill #0

## 2017-07-09 MED FILL — OXYCODONE-APAP 10-325 MG TA: 10-325 | 8 days supply | Qty: 30 | Fill #0

## 2017-07-09 NOTE — Progress Notes (Signed)
[name] [name] is a 32 y.o. female is here for follow up.  History of Present Illness:   HPI: See Assessment and Plan section for Problem Based Charting of issues discussed today.   There are no preventive care reminders to display for this patient. [facility] screen [facility] 2[facility] 06/08/2017 08/13/2015  Decreased Interest 3 1  Down, Depressed, Hopeless 2 1  PHQ - 2 Score 5 2  Altered sleeping 3 1  Tired, decreased energy 3 1  Change in appetite 3 1  Feeling bad or failure about yourself  3 1  Trouble concentrating 2 1  Moving slowly or fidgety/restless 3 0  Suicidal thoughts 0 0  PHQ-9 Score 22 7  Difficult doing work/chores Very difficult Not difficult at all   PMHx, SurgHx, SocialHx, FamHx, Medications, and Allergies were reviewed in the Visit Navigator and updated as appropriate.   Patient Active Problem List   Diagnosis Date Noted  . Depressive disorder 07/09/2017  . Dysmenorrhea 07/09/2017  . Menorrhagia 07/09/2017  . Reduced libido 07/09/2017  . History of cesarean section 07/09/2017  . Binge eating disorder 04/23/2017  . Morbid obesity (HCC) 04/23/2017  . 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 Pes planus 07/16/2016  . Hallux varus, acquired, right 03/18/2016  . Decreased attention Span 05/28/2015  . Sleep disturbance 05/28/2015  . Iron deficiency anemia 02/22/2015  . Kidney stone 08/24/2013  . 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 Anxiety 04/10/2010  . Elevated blood pressure 04/05/2010   Social History   Tobacco Use  . Smoking status: Never Smoker  . Smokeless tobacco: Never Used  Substance Use Topics  . Alcohol use: No    Comment: rare/socially  . Drug use: No   Current Medications and Allergies:   .  Acetaminophen (TYLENOL PO), Take 500 mg by mouth every 6 (six) hours as needed (pain). , Disp: , Rfl:  .  clonazePAM (KLONOPIN) 0.5 MG tablet, Take 1 tablet (0.5 mg total) by mouth 2 (two) times daily as needed. for anxiety, Disp: 60  tablet, Rfl: 1 .  HYDROcodone-acetaminophen (NORCO/VICODIN) 5-325 MG tablet, Take 1 tablet by mouth every 6 (six) hours as needed., Disp: 20 tablet, Rfl: 0 .  lisdexamfetamine (VYVANSE) 20 MG capsule, Take 1 capsule (20 mg total) by mouth daily., Disp: 30 capsule, Rfl: 0  No Known Allergies   Review of Systems   Pertinent items are noted in the HPI. Otherwise, ROS is negative.  Vitals:   Vitals:   07/09/17 0815  Pulse: (!) 102  Temp: 98.3 F (36.8 C)  TempSrc: Oral  SpO2: 100%  Weight: 231 lb 9.6 oz (105.1 kg)  Height: 5\' 4"  (1.626 m)     Body mass index is 39.75 kg/m.   Physical Exam:   Physical Exam   Assessment and Plan:   1. Binge eating disorder Patient feels that the Vyvanse is helpful in making healthy food decisions and limiting binge eating.  She would like to try increasing the medication slightly.  She feels that it wears off too quickly.  She has tried to take it later in the day but has trouble sleeping.  We discussed increasing it very slightly today to see if that helps or if she will need a dose at a different time of day.  3 refills given today.  - lisdexamfetamine (VYVANSE) 30 MG capsule; Take 1 capsule (30 mg total) by mouth daily.  Dispense: 30 capsule; Refill: 0  2. Depressive disorder Controlled.  No  signs of complications, medication side effects, or red flags.  Continue current regimen.    3. Anxiety Controlled.  No signs of complications, medication side effects, or red flags.  Continue current regimen.    4. Morbid obesity (HCC) Patient continues to lose weight.  She is able to make better food decisions with the Vyvanse.  She is not exercising at this time due to her recent foot surgery.  5. Sleep-disordered breathing Sleep study was previously ordered but the patient has not been contacted yet.  I will follow-up on this today.  6. Left foot pain Patient is a few weeks postop.  She has followed up with her surgeon twice.  She continues to  have significant pain in that foot.  In an effort to decrease her opioid dose, she has to be taken down to Norco after the last visit with her surgeon.  Unfortunately, she is having increased pain and today's tearful.  She states that her daughter accidentally stepped on her foot.  She was taking the Percocet 10/325 at one half dose.  I am okay with giving her a short-term refill.  I have asked her to call her podiatrist to let them now.  She may also continue Tylenol with limit and acetaminophen.  After thorough discussion, we discussed trying Mobic as well.  She is status post bariatric surgery and is hesitant to take anti-inflammatories.  I have advised her to trial this medication but to always take Protonix with it.  If she has any signs of gastritis, she will stop immediately and let me know.  This will be taken daily for the next week and then decrease as quickly as possible.  - oxyCODONE-acetaminophen (PERCOCET) 10-325 MG tablet; Take 1 tablet by mouth every 6 (six) hours as needed for pain. MAXIMUM TOTAL ACETAMINOPHEN DOSE IS 4000 MG PER DAY  Dispense: 30 tablet; Refill: 0 - meloxicam (MOBIC) 15 MG tablet; Take 1 tablet (15 mg total) by mouth daily.  Dispense: 30 tablet; Refill: 1  . Reviewed expectations re: course of current medical issues. . Discussed self-management of symptoms. . Outlined signs and symptoms indicating need for more acute intervention. . Patient verbalized understanding and all questions were answered. Marland Kitchen Health Maintenance issues including appropriate healthy diet, exercise, and smoking avoidance were discussed with patient. . See orders for this visit as documented in the electronic medical record. . Patient received an After Visit Summary.   Helane Rima, DO [facility] 07/09/2017

## 2017-07-14 ENCOUNTER — Ambulatory Visit (INDEPENDENT_AMBULATORY_CARE_PROVIDER_SITE_OTHER): Payer: 59 | Admitting: Podiatry

## 2017-07-14 ENCOUNTER — Ambulatory Visit (INDEPENDENT_AMBULATORY_CARE_PROVIDER_SITE_OTHER): Payer: 59

## 2017-07-14 ENCOUNTER — Encounter: Payer: Self-pay | Admitting: Podiatry

## 2017-07-14 DIAGNOSIS — M2012 Hallux valgus (acquired), left foot: Secondary | ICD-10-CM

## 2017-07-14 MED ORDER — OXYCODONE-ACETAMINOPHEN 5-325 MG PO TABS: 1.0000 | ORAL_TABLET | Freq: Four times a day (QID) | ORAL | 0 refills | Status: DC | PRN

## 2017-07-14 MED FILL — OXYCOD/ACETAMINOPHEN 5-325M: 5-325 | 2 days supply | Qty: 15 | Fill #0

## 2017-07-15 NOTE — Progress Notes (Signed)
Subjective: [name] DixonSamantha D Lupa is a 32 y.o. is seen today in office s/p LEFT Austin/Akin bunionectomy preformed on 06/24/2017.  She states that she is doing well.  She does have some pain to the toe still and she is asking to go back on the Percocet instead of the Vicodin.  She denies any recent injury or trauma to the area.  She denies any increase in swelling or redness.  She is remained in the cam boot and weightbearing in the boot.  She has no recent injury that she reports. She has no further questions or concerns.  Denies any systemic complaints such as fevers, chills, nausea, vomiting. No calf pain, chest pain, shortness of breath.   Objective: General: No acute distress, AAOx3  DP/PT pulses palpable 2/4, CRT < 3 sec to all digits.  Protective sensation intact. Motor function intact.  Left foot: Incision is well coapted without any evidence of dehiscence and sutures ends are intact.  The toes and rectus position.  There is no surrounding erythema, ascending cellulitis, fluctuance, crepitus, malodor, drainage/purulence. There is decreased edema around the surgical site. There is mild pain along the surgical site.There is bruising to the digits although minimal and this appears to be improving.  Incision appears to be healing well without any signs of infection No other areas of tenderness to bilateral lower extremities.  No other open lesions or pre-ulcerative lesions.  No pain with calf compression, swelling, warmth, erythema.   Assessment and Plan:  Status post left Austin, Akin bunionectomy, doing well with no complications   -Treatment options discussed including all alternatives, risks, and complications -I did cut the suture ends today.  Antibiotic ointment was applied followed by a bandage.  She can start to shower on Friday.  Apply small amount of antibiotic ointment and a bandage as well. -Continuing cam boot.  Limit weightbearing -Ice/elevation -Pain medication as needed-prescribed  Percocet 5/325-hopefully can go back to Vicodin next appointment -Monitor for any clinical signs or symptoms of infection and DVT/PE and directed to call the office immediately should any occur or go to the ER. -Will extend her out of work until 07/27/2017 -Follow-up as scheduled or sooner if any problems arise. In the meantime, encouraged to call the office with any questions, concerns, change in symptoms.   Ovid CurdMatthew Wagoner, DPM

## 2017-07-16 ENCOUNTER — Encounter: Payer: Self-pay | Admitting: Podiatry

## 2017-07-16 DIAGNOSIS — M79672 Pain in left foot: Secondary | ICD-10-CM

## 2017-07-17 ENCOUNTER — Telehealth: Payer: Self-pay | Admitting: *Deleted

## 2017-07-17 MED ORDER — OXYCODONE-ACETAMINOPHEN 5-325 MG PO TABS: 1.0000 | ORAL_TABLET | Freq: Four times a day (QID) | ORAL | 0 refills | Status: DC | PRN

## 2017-07-17 MED FILL — OXYCOD/ACETAMINOPHEN 5-325M: 5-325 | 2 days supply | Qty: 15 | Fill #0

## 2017-07-17 NOTE — Telephone Encounter (Signed)
Left message informing pt the rx had been [facility] to [facility].

## 2017-07-19 ENCOUNTER — Encounter: Payer: Self-pay | Admitting: Podiatry

## 2017-07-20 ENCOUNTER — Encounter: Payer: Self-pay | Admitting: Family Medicine

## 2017-07-20 ENCOUNTER — Telehealth: Payer: Self-pay | Admitting: *Deleted

## 2017-07-20 MED ORDER — OXYCODONE-ACETAMINOPHEN 5-325 MG PO TABS: 1.0000 | ORAL_TABLET | Freq: Four times a day (QID) | ORAL | 0 refills | Status: DC | PRN

## 2017-07-20 MED FILL — OXYCOD/ACETAMINOPHEN 5-325M: 5-325 | 2 days supply | Qty: 15 | Fill #0

## 2017-07-20 NOTE — Telephone Encounter (Signed)
Pt InBasket message requesting refill of the percocet. Dr. Philomena DohenyEvans okayed refill as previously. I informed pt she would have to pick up in the [place] office.

## 2017-07-21 ENCOUNTER — Other Ambulatory Visit: Payer: Self-pay | Admitting: Surgical

## 2017-07-21 MED ORDER — ZOLPIDEM TARTRATE 5 MG PO TABS: 5.0000 mg | ORAL_TABLET | Freq: Every evening | ORAL | 0 refills | Status: DC | PRN

## 2017-07-21 MED FILL — ZOLPIDEM TARTRATE 5 MG TABL: 5 | 30 days supply | Qty: 30 | Fill #0

## 2017-07-22 ENCOUNTER — Encounter: Payer: Self-pay | Admitting: Podiatry

## 2017-07-22 ENCOUNTER — Other Ambulatory Visit: Payer: Self-pay | Admitting: Podiatry

## 2017-07-22 MED ORDER — HYDROCODONE-ACETAMINOPHEN 5-325 MG PO TABS: 1.0000 | ORAL_TABLET | Freq: Four times a day (QID) | ORAL | 0 refills | Status: DC | PRN

## 2017-07-22 MED FILL — HYDROCODON-APAP 5-325: 5-325 | 2 days supply | Qty: 15 | Fill #0

## 2017-07-23 ENCOUNTER — Encounter: Payer: Self-pay | Admitting: Podiatry

## 2017-07-24 ENCOUNTER — Telehealth: Payer: Self-pay | Admitting: Podiatry

## 2017-07-24 ENCOUNTER — Telehealth: Payer: Self-pay | Admitting: *Deleted

## 2017-07-24 ENCOUNTER — Other Ambulatory Visit: Payer: Self-pay | Admitting: Podiatry

## 2017-07-24 MED ORDER — HYDROCODONE-ACETAMINOPHEN 5-325 MG PO TABS: 1.0000 | ORAL_TABLET | Freq: Four times a day (QID) | ORAL | 0 refills | Status: DC | PRN

## 2017-07-24 NOTE — Telephone Encounter (Signed)
Pt states she is in [place] if we want to send her pain medication.

## 2017-07-24 NOTE — Telephone Encounter (Signed)
I was calling to see if I can get a refill of the Vicodin. We are getting ready to go out of town around 2 pm. I sent a message through MyChart and I hate to keep bugging you. So can somebody call me back at [phone] or send me a MyChart message? Thank you.

## 2017-07-26 ENCOUNTER — Telehealth: Payer: 59 | Admitting: Family

## 2017-07-26 DIAGNOSIS — J029 Acute pharyngitis, unspecified: Secondary | ICD-10-CM

## 2017-07-26 MED ORDER — PREDNISONE 5 MG PO TABS
5.0000 mg | ORAL_TABLET | ORAL | 0 refills | Status: DC
Start: 2017-07-26 — End: 2017-10-13

## 2017-07-26 MED ORDER — BENZONATATE 100 MG PO CAPS: 100.0000 mg | ORAL_CAPSULE | Freq: Three times a day (TID) | ORAL | 0 refills | Status: DC | PRN

## 2017-07-26 NOTE — Progress Notes (Signed)
Thank you for the details you included in the comment boxes. Those details are very helpful in determining the best course of treatment for you and help us to provide the best care. The new standard of care is that we do not offer antibiotics until greater than 5-7 days. Nearly 89% of these infections are viral. In the past, the mucus color (green, yellow) would determine this, but now we know that this has nothing to do with virus or bacteria so we follow the new guidelines based on the length of time as above. However, the good news is that most people do heal within 3-7 days with supportive care. See plan below. I must also inform you about your Melatonin dosage at 10mg . It is a dangerous overdose. The recommendation based on a 30 year study by [employer] is 0.3mg  to 0.5mg . You can obtain this by purchasing the 1mg  tablet and cutting it in half, the children's liquid dropper to measure it, or the children's 1mg  chew and cutting it in half. Please see this to learn more: https://neal-mccoy.info/http://news.[employer].edu/2001/melatonin-1017  We are sorry that you are not feeling well.  Here is how we plan to help!  Based on your presentation I believe you most likely have A cough due to a virus.  This is called viral bronchitis and is best treated by rest, plenty of fluids and control of the cough.  You may use Ibuprofen or Tylenol as directed to help your symptoms.     In addition you may use A non-prescription cough medication called Mucinex DM: take 2 tablets every 12 hours. and A prescription cough medication called Tessalon Perles 100mg . You may take 1-2 capsules every 8 hours as needed for your cough.  Sterapred 5 mg dosepak  From your responses in the eVisit questionnaire you describe inflammation in the upper respiratory tract which is causing a significant cough.  This is commonly called Bronchitis and has four common causes:    Allergies  Viral Infections  Acid Reflux  Bacterial Infection Allergies, viruses and acid reflux  are treated by controlling symptoms or eliminating the cause. An example might be a cough caused by taking certain blood pressure medications. You stop the cough by changing the medication. Another example might be a cough caused by acid reflux. Controlling the reflux helps control the cough.  USE OF BRONCHODILATOR ("RESCUE") INHALERS: There is a risk from using your bronchodilator too frequently.  The risk is that over-reliance on a medication which only relaxes the muscles surrounding the breathing tubes can reduce the effectiveness of medications prescribed to reduce swelling and congestion of the tubes themselves.  Although you feel brief relief from the bronchodilator inhaler, your asthma may actually be worsening with the tubes becoming more swollen and filled with mucus.  This can delay other crucial treatments, such as oral steroid medications. If you need to use a bronchodilator inhaler daily, several times per day, you should discuss this with your provider.  There are probably better treatments that could be used to keep your asthma under control.     HOME CARE . Only take medications as instructed by your medical team. . Complete the entire course of an antibiotic. . Drink plenty of fluids and get plenty of rest. . Avoid close contacts especially the very young and the elderly . Cover your mouth if you cough or cough into your sleeve. . Always remember to wash your hands . A steam or ultrasonic humidifier can help congestion.   GET HELP  RIGHT AWAY IF: . You develop worsening fever. . You become short of breath . You cough up blood. . Your symptoms persist after you have completed your treatment plan MAKE SURE YOU   Understand these instructions.  Will watch your condition.  Will get help right away if you are not doing well or get worse.  Your e-visit answers were reviewed by a board certified advanced clinical practitioner to complete your personal care plan.  Depending on the  condition, your plan could have included both over the counter or prescription medications. If there is a problem please reply  once you have received a response from your provider. Your safety is important to [place].  If you have drug allergies check your prescription carefully.    You can use MyChart to ask questions about today's visit, request a non-urgent call back, or ask for a work or school excuse for 24 hours related to this e-Visit. If it has been greater than 24 hours you will need to follow up with your provider, or enter a new e-Visit to address those concerns. You will get an e-mail in the next two days asking about your experience.  I hope that your e-visit has been valuable and will speed your recovery. Thank you for using e-visits.

## 2017-07-27 ENCOUNTER — Encounter: Payer: Self-pay | Admitting: Podiatry

## 2017-07-27 ENCOUNTER — Other Ambulatory Visit: Payer: Self-pay | Admitting: Podiatry

## 2017-07-27 MED ORDER — HYDROCODONE-ACETAMINOPHEN 5-325 MG PO TABS: 1.0000 | ORAL_TABLET | Freq: Four times a day (QID) | ORAL | 0 refills | Status: DC | PRN

## 2017-07-27 MED FILL — HYDROCODON-APAP 5-325: 5-325 | 2 days supply | Qty: 15 | Fill #0

## 2017-07-28 ENCOUNTER — Encounter: Payer: Self-pay | Admitting: Podiatry

## 2017-07-28 ENCOUNTER — Ambulatory Visit (INDEPENDENT_AMBULATORY_CARE_PROVIDER_SITE_OTHER): Payer: 59

## 2017-07-28 ENCOUNTER — Ambulatory Visit (INDEPENDENT_AMBULATORY_CARE_PROVIDER_SITE_OTHER): Payer: 59 | Admitting: Podiatry

## 2017-07-28 DIAGNOSIS — M2012 Hallux valgus (acquired), left foot: Secondary | ICD-10-CM | POA: Diagnosis not present

## 2017-07-28 NOTE — Progress Notes (Signed)
Subjective: [name] [name] is a 32 y.o. is seen today in office s/p LEFT Austin/Akin bunionectomy preformed on 06/24/2017.  She states that she is doing well she states that she has been taking Vicodin about 4 tablets a day.  She is scheduled to work and she understands that she cannot take pain medicine she was back to work.  She feels that she only really needs is at nighttime at this point.  She is remained in the surgical boot.  She denies any recent injury or trauma.  She states that she is doing very well and she is very happy today. Denies any systemic complaints such as fevers, chills, nausea, vomiting. No calf pain, chest pain, shortness of breath.   Objective: General: No acute distress, AAOx3  DP/PT pulses palpable 2/4, CRT < 3 sec to all digits.  Protective sensation intact. Motor function intact.  Left foot: Incision is well coapted without any evidence and the scar is well formed.  The toes and rectus position.  There is no surrounding erythema, ascending cellulitis, fluctuance, crepitus, malodor, drainage/purulence. There is minimal edema around the surgical site. There is mild pain along the surgical site however this appears to be improved compared to last appointment. No other areas of tenderness to bilateral lower extremities.  No other open lesions or pre-ulcerative lesions.  No pain with calf compression, swelling, warmth, erythema.   Assessment and Plan:  Status post left Austin, Akin bunionectomy, doing well with no complications   -Treatment options discussed including all alternatives, risks, and complications -X-rays were obtained and reviewed.  No evidence of acute fracture.  Toes and rectus position. -Incision appears to be healing well.  I want her to start range of motion exercises of the first MPJ.  Discussed physical therapy which she does not work on this on her own at home. -She got Vicodin yesterday.  She is only taking 4 tablets a day.  She is scheduled go back  to work and we again discussed that she cannot work or drive if taking pain medicine she verbally understood this.  Hopefully can start to wean her off of the Vicodin as well. -Continue ice and elevation. -She is going back to work so we will keep her in the cam boot as opposed to transition to a surgical shoe. -Follow-up in 2 weeks or sooner if needed.  Call any questions or concerns meantime. -Monitor for any clinical signs or symptoms of infection and directed to call the office immediately should any occur or go to the ER.  [name] [name] DPM

## 2017-07-29 ENCOUNTER — Encounter: Payer: Self-pay | Admitting: Podiatry

## 2017-07-29 ENCOUNTER — Telehealth: Payer: Self-pay | Admitting: *Deleted

## 2017-07-29 MED ORDER — HYDROCODONE-ACETAMINOPHEN 5-325 MG PO TABS: 1.0000 | ORAL_TABLET | Freq: Four times a day (QID) | ORAL | 0 refills | Status: DC | PRN

## 2017-07-29 MED FILL — HYDROCODON-APAP 5-325: 5-325 | 4 days supply | Qty: 15 | Fill #0

## 2017-07-29 NOTE — Telephone Encounter (Signed)
Pt sent request In-Basket for Vicodin. DR. Wagoner's LOV note states pt may use the Vicodin. I sent in-basket message that she could pick up the rx in the [place] office.

## 2017-07-31 ENCOUNTER — Telehealth: Payer: Self-pay | Admitting: *Deleted

## 2017-07-31 ENCOUNTER — Encounter: Payer: Self-pay | Admitting: Podiatry

## 2017-07-31 MED ORDER — HYDROCODONE-ACETAMINOPHEN 5-325 MG PO TABS: 1.0000 | ORAL_TABLET | Freq: Four times a day (QID) | ORAL | 0 refills | Status: DC | PRN

## 2017-07-31 MED FILL — HYDROCODON-APAP 5-325: 5-325 | 3 days supply | Qty: 15 | Fill #0

## 2017-07-31 NOTE — Telephone Encounter (Signed)
Pt requested refill of the vicodin by InBasket. I told pt she would have to pick rx up in the [place] before 4:00pm.

## 2017-08-03 ENCOUNTER — Telehealth: Payer: Self-pay | Admitting: Podiatry

## 2017-08-03 ENCOUNTER — Encounter: Payer: Self-pay | Admitting: Podiatry

## 2017-08-03 NOTE — Telephone Encounter (Signed)
I'm out my medication and I'm back at work. So now my foot is in a lot of pain at the end of the day. If you could please call me back at [phone] or send me a message through MyChart. Thank you.

## 2017-08-03 NOTE — Telephone Encounter (Signed)
I sent her a mychart message asking how much she is taking a day.

## 2017-08-04 ENCOUNTER — Other Ambulatory Visit: Payer: Self-pay | Admitting: Podiatry

## 2017-08-04 MED ORDER — HYDROCODONE-ACETAMINOPHEN 5-325 MG PO TABS: 1.0000 | ORAL_TABLET | Freq: Four times a day (QID) | ORAL | 0 refills | Status: DC | PRN

## 2017-08-10 ENCOUNTER — Encounter: Payer: Self-pay | Admitting: Podiatry

## 2017-08-11 ENCOUNTER — Ambulatory Visit (INDEPENDENT_AMBULATORY_CARE_PROVIDER_SITE_OTHER): Payer: 59

## 2017-08-11 ENCOUNTER — Encounter: Payer: Self-pay | Admitting: Podiatry

## 2017-08-11 ENCOUNTER — Encounter: Payer: Self-pay | Admitting: Family Medicine

## 2017-08-11 ENCOUNTER — Ambulatory Visit (INDEPENDENT_AMBULATORY_CARE_PROVIDER_SITE_OTHER): Payer: Self-pay | Admitting: Podiatry

## 2017-08-11 DIAGNOSIS — M2012 Hallux valgus (acquired), left foot: Secondary | ICD-10-CM | POA: Diagnosis not present

## 2017-08-11 MED ORDER — HYDROCODONE-ACETAMINOPHEN 5-325 MG PO TABS: 1.0000 | ORAL_TABLET | Freq: Every day | ORAL | 0 refills | Status: DC

## 2017-08-11 MED FILL — HYDROCODON-APAP 5-325: 5-325 | 7 days supply | Qty: 7 | Fill #0

## 2017-08-11 NOTE — Progress Notes (Signed)
Subjective: [name] [name] is a 32 y.o. is seen today in office s/p LEFT Austin/Akin bunionectomy preformed on 06/24/2017.  She states that she is doing well.  She has some stiffness in the big toe joint today but she states that otherwise she is been moving quite a bit.  She is taking 2 Vicodin at nighttime and she like to wean off of this.  Unfortunately she cannot go completely off without going through mild withdrawal so we need to wean him slowly.  She is down to taking 2  tablets at nighttime.  She said no recent injury or trauma.  She is been in the surgical shoe.  She has no other concerns today. Denies any systemic complaints such as fevers, chills, nausea, vomiting. No calf pain, chest pain, shortness of breath.   Objective: General: No acute distress, AAOx3  DP/PT pulses palpable 2/4, CRT < 3 sec to all digits.  Protective sensation intact. Motor function intact.  Left foot: Incision is well coapted without any evidence and the scar is well formed.  The toes and rectus position.  Mild decreased range of motion of the first MTPJ however there is no pain or crepitation with MPJ range of motion.  Mild tenderness palpation of the surgical site but overall there is no other areas of tenderness.  There is no significant edema to the surgical site is no erythema or increase in warmth.  Appears to be healing well.  The toes and rectus position.   No other open lesions or pre-ulcerative lesions.  No pain with calf compression, swelling, warmth, erythema.   Assessment and Plan:  Status post left Austin, Akin bunionectomy, doing well with no complications   -Treatment options discussed including all alternatives, risks, and complications -X-rays were obtained and reviewed.  No evidence of acute fracture.  Toes and rectus position. -At this time we will try to wean off of the Vicodin.  We will do 1 pill at nighttime only for the next week and that point we can stop the medicine.  We discussed other  options and she is inquiring about possible gabapentin.  We will consider this when she finishes the Vicodin. -Continue range of motion exercises. -Ice elevation -She can slowly start to transition to regular shoe as tolerated.  She is in start with this at home and she is more comfortable and she will transition to a shoe at work. -RTC 3 weeks or sooner if needed.  Vivi BarrackMatthew R Deeksha Cotrell DPM

## 2017-08-14 ENCOUNTER — Ambulatory Visit: Payer: Self-pay | Admitting: Family Medicine

## 2017-08-18 ENCOUNTER — Encounter: Payer: Self-pay | Admitting: Podiatry

## 2017-08-18 ENCOUNTER — Other Ambulatory Visit: Payer: Self-pay | Admitting: Podiatry

## 2017-08-18 MED ORDER — GABAPENTIN 100 MG PO CAPS: 100.0000 mg | ORAL_CAPSULE | Freq: Every day | ORAL | 0 refills | Status: DC

## 2017-08-19 MED FILL — GABAPENTIN 100 MG CAPSULE: 100 | 30 days supply | Qty: 30 | Fill #0

## 2017-08-25 DIAGNOSIS — H52223 Regular astigmatism, bilateral: Secondary | ICD-10-CM | POA: Diagnosis not present

## 2017-08-25 DIAGNOSIS — H16213 Exposure keratoconjunctivitis, bilateral: Secondary | ICD-10-CM | POA: Diagnosis not present

## 2017-08-25 DIAGNOSIS — H5203 Hypermetropia, bilateral: Secondary | ICD-10-CM | POA: Diagnosis not present

## 2017-08-26 MED FILL — VYVANSE 30 MG CAPSULE: 30 | 30 days supply | Qty: 30 | Fill #0

## 2017-09-01 ENCOUNTER — Ambulatory Visit (INDEPENDENT_AMBULATORY_CARE_PROVIDER_SITE_OTHER): Payer: 59

## 2017-09-01 ENCOUNTER — Ambulatory Visit (INDEPENDENT_AMBULATORY_CARE_PROVIDER_SITE_OTHER): Payer: 59 | Admitting: Podiatry

## 2017-09-01 DIAGNOSIS — Z9889 Other specified postprocedural states: Secondary | ICD-10-CM

## 2017-09-01 DIAGNOSIS — M2012 Hallux valgus (acquired), left foot: Secondary | ICD-10-CM

## 2017-09-01 MED ORDER — GABAPENTIN 100 MG PO CAPS: 100.0000 mg | ORAL_CAPSULE | Freq: Two times a day (BID) | ORAL | 0 refills | Status: DC

## 2017-09-02 NOTE — Progress Notes (Signed)
Subjective: [name] DixonSamantha D Helfman is a 32 y.o. is seen today in office s/p LEFT Austin/Akin bunionectomy preformed on 06/24/2017.  She states that she is doing well she is back to wearing regular shoe and she is working.  She has been taking gabapentin 200 mg and she states this completely limits her pain and she is doing much better.  She states that she is sitting at work she will take 100 mg otherwise she will take 2. She has no other concerns today. Denies any systemic complaints such as fevers, chills, nausea, vomiting. No calf pain, chest pain, shortness of breath.   Objective: General: No acute distress, AAOx3  DP/PT pulses palpable 2/4, CRT < 3 sec to all digits.  Protective sensation intact. Motor function intact.  Left foot: Incision is well coapted without any evidence and the scar is well formed.  There is no pain or crepitation with MPJ range of motion.  The toes and rectus position.  There is  no significant edema, erythema, increase in warmth.  No signs of infection.  She appears to be healing well.  She presents today in a regular shoe.   No other open lesions or pre-ulcerative lesions.  No pain with calf compression, swelling, warmth, erythema.   Assessment and Plan:  Status post left Austin, Akin bunionectomy, doing well with no complications   -Treatment options discussed including all alternatives, risks, and complications -At this time I want her to continue with regular shoe and continue with range of motion, rehab exercises.  Discussed she can continue gabapentin but I want her to start to try to wean off of this as she can over the next several weeks.  I like to cut her back to 100 mg daily if able in the near future.  I did refill gabapentin today. -Continue ice to the areas needed. -Follow-up in 6 weeks or sooner if needed.  Call any questions or concerns.  Vivi BarrackMatthew R Clarece Drzewiecki DPM

## 2017-09-04 MED FILL — GABAPENTIN 100 MG CAPS: 100 | 30 days supply | Qty: 60 | Fill #0

## 2017-09-25 MED FILL — VYVANSE 30 MG CAPSULE: 30 | 30 days supply | Qty: 30 | Fill #0

## 2017-10-05 ENCOUNTER — Telehealth: Payer: Self-pay

## 2017-10-05 NOTE — Telephone Encounter (Signed)
Sleep study ordered on 06/08/17 patient has not heard anything back. Can you look into this for her?

## 2017-10-13 ENCOUNTER — Encounter: Payer: Self-pay | Admitting: Family Medicine

## 2017-10-13 ENCOUNTER — Ambulatory Visit: Payer: 59 | Admitting: Family Medicine

## 2017-10-13 ENCOUNTER — Ambulatory Visit (INDEPENDENT_AMBULATORY_CARE_PROVIDER_SITE_OTHER): Payer: 59 | Admitting: Podiatry

## 2017-10-13 VITALS — BP 118/76 | HR 100 | Temp 98.1°F | Ht 64.0 in | Wt 221.6 lb

## 2017-10-13 DIAGNOSIS — M79672 Pain in left foot: Secondary | ICD-10-CM | POA: Diagnosis not present

## 2017-10-13 DIAGNOSIS — M2012 Hallux valgus (acquired), left foot: Secondary | ICD-10-CM | POA: Diagnosis not present

## 2017-10-13 DIAGNOSIS — F5081 Binge eating disorder: Secondary | ICD-10-CM | POA: Diagnosis not present

## 2017-10-13 DIAGNOSIS — G479 Sleep disorder, unspecified: Secondary | ICD-10-CM

## 2017-10-13 DIAGNOSIS — D699 Hemorrhagic condition, unspecified: Secondary | ICD-10-CM | POA: Diagnosis not present

## 2017-10-13 MED ORDER — LISDEXAMFETAMINE DIMESYLATE 40 MG PO CAPS: 40.0000 mg | ORAL_CAPSULE | ORAL | 0 refills | Status: DC

## 2017-10-13 MED ORDER — GABAPENTIN 100 MG PO CAPS: ORAL_CAPSULE | ORAL | 2 refills | Status: DC

## 2017-10-13 MED ORDER — MELOXICAM 15 MG PO TABS: 15.0000 mg | ORAL_TABLET | Freq: Every day | ORAL | 1 refills | Status: DC

## 2017-10-13 MED FILL — MELOXICAM 15 MG TABLET: 15 | 30 days supply | Qty: 30 | Fill #0

## 2017-10-13 MED FILL — GABAPENTIN 100 MG CAPSULE: 100 | 30 days supply | Qty: 90 | Fill #0

## 2017-10-13 NOTE — Progress Notes (Signed)
Subjective: 32 year old female presents the office today for follow-up evaluation status post left Eliberto Ivory, Akin bunionectomy performed in January.  Since I last saw her she has been doing very well she is been wearing a regular shoe.  Changed her gabapentin dose by her primary care physician and the reasons for other reasons.  Is minimal pain to her feet.  She has no recent injury and she has no other concerns.  Overall she is doing well. Denies any systemic complaints such as fevers, chills, nausea, vomiting. No acute changes since last appointment, and no other complaints at this time.   Objective: AAO x3, NAD DP/PT pulses palpable bilaterally, CRT less than 3 seconds Scar from the prior surgery is well-healed there is no tenderness palpation.  The toes and rectus position.  There is adequate range of motion of the first MTPJ.  There is no tenderness there is no other areas of tenderness identified bilaterally.  Some mild subjective numbness to the incision although mild No open lesions or pre-ulcerative lesions.  No pain with calf compression, swelling, warmth, erythema  Assessment: Status post bilateral bunion surgery, doing well  Plan: -All treatment options discussed with the patient including all alternatives, risks, complications.  -At this point she is doing multiple surgeries she has been able to wear regular shoe.  We will discharge her from the postoperative course of both surgeries.  Continue gabapentin as prescribed by her primary care physician.  Continue with supportive shoes and orthotics. -Patient encouraged to call the office with any questions, concerns, change in symptoms.  -Follow-up as needed  [name] DPM

## 2017-10-13 NOTE — Progress Notes (Signed)
[name] [name] is a 32 y.o. female is here for follow up.  History of Present Illness:   Barnie Mort, CMA acting as scribe for Dr. Helane Rima.   HPI: Patient in for follow up for medication refills. She feels like she may need to increase Vyvanse does not feel is is helping as much as it could. Binging at night.  Tolerating medication well.  No other concerns.  History of foot pain and paresthesias.  Taking Neurontin 100 mg p.o. twice daily.  Patient states that her podiatrist told her that she will need a pain clinic if she continues with this medication.  There are no preventive care reminders to display for this patient. Depression screen [facility] 2/9 06/08/2017 08/13/2015  Decreased Interest 3 1  Down, Depressed, Hopeless 2 1  PHQ - 2 Score 5 2  Altered sleeping 3 1  Tired, decreased energy 3 1  Change in appetite 3 1  Feeling bad or failure about yourself  3 1  Trouble concentrating 2 1  Moving slowly or fidgety/restless 3 0  Suicidal thoughts 0 0  PHQ-9 Score 22 7  Difficult doing work/chores Very difficult Not difficult at all   PMHx, SurgHx, SocialHx, FamHx, Medications, and Allergies were reviewed in the Visit Navigator and updated as appropriate.   Patient Active Problem List   Diagnosis Date Noted  . Bleeding diathesis (HCC) 10/13/2017  . Depressive disorder 07/09/2017  . Dysmenorrhea 07/09/2017  . Menorrhagia 07/09/2017  . Reduced libido 07/09/2017  . History of cesarean section 07/09/2017  . Binge eating disorder 04/23/2017  . Morbid obesity (HCC) 04/23/2017  . 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 Pes planus 07/16/2016  . Hallux varus, acquired, right 03/18/2016  . Decreased attention Span 05/28/2015  . Sleep disturbance 05/28/2015  . Iron deficiency anemia 02/22/2015  . Kidney stone 08/24/2013  . 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 Anxiety 04/10/2010  . Elevated blood pressure 04/05/2010   Social History   Tobacco Use    . Smoking status: Never Smoker  . Smokeless tobacco: Never Used  Substance Use Topics  . Alcohol use: No    Comment: rare/socially  . Drug use: No   Current Medications and Allergies:   .  gabapentin (NEURONTIN) 100 MG capsule,  in the morning and 100 mg at night, Disp: 90 capsule, Rfl: 2 .  meloxicam (MOBIC) 15 MG tablet, Take 1 tablet (15 mg total) by mouth daily., Disp: 30 tablet, Rfl: 1 .  lisdexamfetamine (VYVANSE) 40 MG capsule, Take 1 capsule (40 mg total) by mouth every morning., Disp: 30 capsule, Rfl: 0   No Known Allergies   Review of Systems   Pertinent items are noted in the HPI. Otherwise, ROS is negative.  Vitals:   Vitals:   10/13/17 0758  BP: 118/76  Pulse: 100  Temp: 98.1 F (36.7 C)  TempSrc: Oral  SpO2: 100%  Weight: 221 lb 9.6 oz (100.5 kg)  Height:  (1.626 m)     Body mass index is 38.04 kg/m.   Physical Exam:   Physical Exam  Constitutional: She is oriented to person, place, and time. She appears well-developed and well-nourished. No distress.  HENT:  Head: Normocephalic and atraumatic.  Right Ear: External ear normal.  Left Ear: External ear normal.  Nose: Nose normal.  Mouth/Throat: Oropharynx is clear and moist.  Eyes: Pupils are equal, round, and reactive to light. Conjunctivae and EOM are normal.  Neck: Normal range of motion.  Neck supple. No thyromegaly present.  Cardiovascular: Normal rate, regular rhythm, normal heart sounds and intact distal pulses.  Pulmonary/Chest: Effort normal and breath sounds normal.  Abdominal: Soft. Bowel sounds are normal.  Musculoskeletal: Normal range of motion.  Lymphadenopathy:    She has no cervical adenopathy.  Neurological: She is alert and oriented to person, place, and time.  Skin: Skin is warm and dry. Capillary refill takes less than 2 seconds.  Psychiatric: She has a normal mood and affect. Her behavior is normal.  Nursing note and vitals reviewed.    Assessment and Plan:    [name] [name] was seen today for follow-up.  Diagnoses and all orders for this visit:  Sleep disturbance -     Ambulatory referral to Pulmonology  Left foot pain -     meloxicam (MOBIC) 15 MG tablet; Take 1 tablet (15 mg total) by mouth daily. -     gabapentin (NEURONTIN) 100 MG capsule;  in the morning and 200 mg at night  Binge eating disorder -     lisdexamfetamine (VYVANSE) 40 MG capsule; Take 1 capsule (40 mg total) by mouth every morning. -     lisdexamfetamine (VYVANSE) 40 MG capsule; Take 1 capsule (40 mg total) by mouth every morning. -     lisdexamfetamine (VYVANSE) 40 MG capsule; Take 1 capsule (40 mg total) by mouth every morning.  Morbid obesity (HCC) Comments: Doing well. [employer]. Down 20 pounds.  Bleeding diathesis (HCC)   . Reviewed expectations re: course of current medical issues. . Discussed self-management of symptoms. . Outlined signs and symptoms indicating need for more acute intervention. . Patient verbalized understanding and all questions were answered. Marland Kitchen Health Maintenance issues including appropriate healthy diet, exercise, and smoking avoidance were discussed with patient. . See orders for this visit as documented in the electronic medical record. . Patient received an After Visit Summary.  Helane Rima, DO , [facility] 10/13/2017  Future Appointments  Date Time Provider Department Center  10/13/2017  9:15 AM [name], DPM [facility]  01/12/2018  8:00 AM Helane Rima, DO [facility]

## 2017-10-17 MED FILL — VYVANSE 40 MG CAPSULE: 40 | 30 days supply | Qty: 30 | Fill #0

## 2017-11-04 NOTE — Telephone Encounter (Signed)
Sleep study is currently being authorized through insurance-  no further action on our end needed currently.

## 2017-11-10 ENCOUNTER — Telehealth: Payer: Self-pay | Admitting: Family Medicine

## 2017-11-10 ENCOUNTER — Encounter (INDEPENDENT_AMBULATORY_CARE_PROVIDER_SITE_OTHER): Payer: Self-pay

## 2017-11-10 NOTE — Telephone Encounter (Signed)
Is this for the sleep study? Can you please take care of this.

## 2017-11-10 NOTE — Telephone Encounter (Signed)
See note.   Copied from CRM [id number]. Topic: General - Other >> Nov 10, 2017  1:08 PM Arlyss Gandy, NT wrote: Reason for CRM: April with [employer] calling to see if the office can contact the pt and have her contact them for her order. Pt not returning their calls. CB#: [phone] Ref# that pt needs to provide: [id number]

## 2017-11-16 ENCOUNTER — Encounter: Payer: Self-pay | Admitting: Neurology

## 2017-11-16 MED FILL — VYVANSE 40 MG CAPSULE: 40 | 30 days supply | Qty: 30 | Fill #0

## 2017-11-16 MED FILL — MELOXICAM 15 MG TABLET: 15 | 30 days supply | Qty: 30 | Fill #1

## 2017-11-16 MED FILL — GABAPENTIN 100 MG CAPSULE: 100 | 30 days supply | Qty: 90 | Fill #1

## 2017-12-08 ENCOUNTER — Institutional Professional Consult (permissible substitution): Payer: 59 | Admitting: Neurology

## 2017-12-11 ENCOUNTER — Other Ambulatory Visit: Payer: Self-pay

## 2017-12-11 ENCOUNTER — Ambulatory Visit
Admission: EM | Admit: 2017-12-11 | Discharge: 2017-12-11 | Disposition: A | Discharge status: Home / Self Care | Payer: 59 | Attending: Family Medicine | Admitting: Family Medicine

## 2017-12-11 ENCOUNTER — Encounter: Payer: Self-pay | Admitting: Emergency Medicine

## 2017-12-11 DIAGNOSIS — R110 Nausea: Secondary | ICD-10-CM

## 2017-12-11 DIAGNOSIS — N23 Unspecified renal colic: Secondary | ICD-10-CM | POA: Diagnosis not present

## 2017-12-11 DIAGNOSIS — R1030 Lower abdominal pain, unspecified: Secondary | ICD-10-CM | POA: Diagnosis not present

## 2017-12-11 DIAGNOSIS — M545 Low back pain: Secondary | ICD-10-CM | POA: Diagnosis not present

## 2017-12-11 LAB — URINALYSIS, COMPLETE (UACMP) WITH MICROSCOPIC
BACTERIA UA: NONE SEEN
Glucose, UA: NEGATIVE mg/dL
HGB URINE DIPSTICK: NEGATIVE
Ketones, ur: NEGATIVE mg/dL
LEUKOCYTES UA: NEGATIVE
NITRITE: NEGATIVE
PROTEIN: NEGATIVE mg/dL
RBC / HPF: NONE SEEN RBC/hpf (ref 0–5)
Specific Gravity, Urine: >1.03 — ABNORMAL HIGH (ref 1.005–1.030)
pH: 5.5 (ref 5.0–8.0)

## 2017-12-11 LAB — PREGNANCY, URINE: PREG TEST UR: NEGATIVE

## 2017-12-11 MED ORDER — KETOROLAC TROMETHAMINE 30 MG/ML IJ SOLN
30.0000 mg | Freq: Once | INTRAMUSCULAR | Status: AC
  Administered 2017-12-11: 30 mg via INTRAMUSCULAR

## 2017-12-11 MED ORDER — ONDANSETRON 4 MG PO TBDP
4.0000 mg | ORAL_TABLET | Freq: Once | ORAL | Status: AC
  Administered 2017-12-11: 4 mg via ORAL

## 2017-12-11 MED ORDER — ONDANSETRON 4 MG PO TBDP: 4.0000 mg | ORAL_TABLET | Freq: Three times a day (TID) | ORAL | 0 refills | Status: DC | PRN

## 2017-12-11 MED ORDER — HYDROCODONE-ACETAMINOPHEN 5-325 MG PO TABS: 1.0000 | ORAL_TABLET | Freq: Four times a day (QID) | ORAL | 0 refills | Status: DC | PRN

## 2017-12-11 NOTE — ED Triage Notes (Signed)
Patient c/o left flank pain radiating into the groin area that started 2 days ago. Positive for nausea, denies vomiting. Patient states she has had kidney stones before and this is the same pain as before.

## 2017-12-11 NOTE — Discharge Instructions (Addendum)
Please make sure you are drinking lots of fluids.  Take medications as needed for pain patient Norco as needed for pain Zofran as needed for nausea.  If any increasing pain, fevers please return to the ER or clinic for further evaluation.

## 2017-12-11 NOTE — ED Provider Notes (Signed)
[facility]    CSN: [id number] Arrival date & time: 12/11/17  1658     History   Chief Complaint Chief Complaint  Patient presents with  . Flank Pain    HPI [name] [name] is a 32 y.o. female.   Presents to the urgent care facility for evaluation of left flank pain.  Patient states she developed left lower back pain 2 days ago that since is been radiating along the left flank into the left lower abdomen.  She describes sharp pain with movement.  She denies any fevers.  She has had mild nausea without vomiting.  She is been taking Tylenol for pain along with meloxicam.  Her pain is 7 out of 10.  She denies any chest pain, shortness of breath.  She has a long history of kidney stones, she states she has seen the urologist.  The pain she is feeling today mimics her previous kidney stone pain.  Patient has been tolerating p.o. well.  She is been at work today.  She denies any right upper quadrant pain, right lower quadrant pain.   HPI  Past Medical History:  Diagnosis Date  . Anemia   . Anxiety   . DEPRESSION   . GERD (gastroesophageal reflux disease)   . Hypothyroidism    Hx of, normalized TSH during pregnancy 2011  . Infertility, female   . Migraines   . Morbid obesity (HCC)    s/p RY 08/2012 - start weight 290 pounds  . Nephrolithiasis   . PCOS (polycystic ovarian syndrome)   . PONV (postoperative nausea and vomiting)   . Pregnancy induced hypertension     Patient Active Problem List   Diagnosis Date Noted  . Bleeding diathesis (HCC) 10/13/2017  . Depressive disorder 07/09/2017  . Dysmenorrhea 07/09/2017  . Menorrhagia 07/09/2017  . Reduced libido 07/09/2017  . History of cesarean section 07/09/2017  . Binge eating disorder 04/23/2017  . Morbid obesity (HCC) 04/23/2017  . 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 Pes planus 07/16/2016  . Hallux varus, acquired, right 03/18/2016  . Decreased attention Span 05/28/2015  . Sleep disturbance  05/28/2015  . Iron deficiency anemia 02/22/2015  . Kidney stone 08/24/2013  . 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 Anxiety 04/10/2010  . Elevated blood pressure 04/05/2010    Past Surgical History:  Procedure Laterality Date  . Quintella ReichertIKEN OSTEOTOMY Left 06/24/2017   Procedure: Ralene BatheAIKEN OSTEOTOMY;  Surgeon: Vivi BarrackWagoner, Matthew R, DPM;  Location: [facility];  Service: Podiatry;  Laterality: Left;  . BUNIONECTOMY Left 06/24/2017   Procedure: Anthoney HaradaALSTON BUNIONECTOMY;  Surgeon: Vivi BarrackWagoner, Matthew R, DPM;  Location: [facility];  Service: Podiatry;  Laterality: Left;  . CESAREAN SECTION     x 2  . FOOT SURGERY    . 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   Procedure: LAPAROSCOPIC ROUX-EN-Y GASTRIC;  Surgeon: Atilano InaEric M Wilson, MD;  Location: [facility];  Service: General;  Laterality: N/A;  laparoscopic roux-en-y gastric bypass  . LITHOTRIPSY Left   . TONSILLECTOMY    . TUBAL LIGATION    . UPPER GI ENDOSCOPY  08/24/2012   Procedure: UPPER GI ENDOSCOPY;  Surgeon: Atilano InaEric M Wilson, MD;  Location: [facility];  Service: General;;  . WISDOM TOOTH EXTRACTION      OB History    Gravida  2   Para  2   Term  2   Preterm  0   AB  0   Living  2  SAB  0   TAB  0   Ectopic  0   Multiple  0   Live Births  2            Home Medications    Prior to Admission medications   Medication Sig Start Date End Date Taking? Authorizing Provider  gabapentin (NEURONTIN) 100 MG capsule 100mg  in the morning and 200 mg at night 10/13/17  Yes Helane Rima, DO  lisdexamfetamine (VYVANSE) 40 MG capsule Take 1 capsule (40 mg total) by mouth every morning. 10/13/17  Yes Helane Rima, DO  meloxicam (MOBIC) 15 MG tablet Take 1 tablet (15 mg total) by mouth daily. 10/13/17  Yes Helane Rima, DO  HYDROcodone-acetaminophen (NORCO) 5-325 MG tablet Take 1 tablet by mouth every 6 (six) hours as needed for moderate pain. 12/11/17   Evon Slack, PA-C  lisdexamfetamine (VYVANSE) 40 MG capsule Take  1 capsule (40 mg total) by mouth every morning. 11/12/17   Helane Rima, DO  lisdexamfetamine (VYVANSE) 40 MG capsule Take 1 capsule (40 mg total) by mouth every morning. 12/12/17   Helane Rima, DO  ondansetron (ZOFRAN ODT) 4 MG disintegrating tablet Take 1 tablet (4 mg total) by mouth every 8 (eight) hours as needed for nausea or vomiting. 12/11/17   Evon Slack, PA-C    Family History Family History  Problem Relation Age of Onset  . Hyperlipidemia Father   . Hypertension Father   . Kidney disease Father   . Colon cancer Father 39  . Hypertension Mother   . Hypertension Maternal Grandmother   . Uterine cancer Maternal Grandmother 40  . Diabetes Maternal Grandfather     Social History Social History   Tobacco Use  . Smoking status: Never Smoker  . Smokeless tobacco: Never Used  Substance Use Topics  . Alcohol use: No    Comment: rare/socially  . Drug use: No     Allergies   Patient has no known allergies.   Review of Systems Review of Systems  Constitutional: Negative for chills and fever.  Respiratory: Negative for shortness of breath.   Cardiovascular: Negative for chest pain.  Gastrointestinal: Positive for nausea. Negative for abdominal pain and vomiting.  Genitourinary: Positive for flank pain and urgency. Negative for difficulty urinating, dysuria and frequency.  Musculoskeletal: Negative for back pain and myalgias.  Skin: Negative for rash.  Neurological: Negative for dizziness and headaches.     Physical Exam Triage Vital Signs ED Triage Vitals  Enc Vitals Group     BP 12/11/17 1709 (!) 144/106     Pulse Rate 12/11/17 1709 92     Resp 12/11/17 1709 16     Temp 12/11/17 1709 98.1 F (36.7 C)     Temp Source 12/11/17 1709 Oral     SpO2 12/11/17 1709 100 %     Weight 12/11/17 1706 212 lb (96.2 kg)     Height 12/11/17 1706 5\' 4"  (1.626 m)     Head Circumference --      Peak Flow --      Pain Score 12/11/17 1706 7     Pain Loc --      Pain  Edu? --      Excl. in GC? --    No data found.  Updated Vital Signs BP (!) 150/104 (BP Location: Left Arm)   Pulse 89   Temp 98.1 F (36.7 C) (Oral)   Resp 18   Ht 5\' 4"  (1.626 m)   Wt 212 lb (  96.2 kg)   SpO2 100%   BMI 36.39 kg/m   Visual Acuity Right Eye Distance:   Left Eye Distance:   Bilateral Distance:    Right Eye Near:   Left Eye Near:    Bilateral Near:     Physical Exam  Constitutional: She is oriented to person, place, and time. She appears well-developed and well-nourished.  HENT:  Head: Normocephalic and atraumatic.  Right Ear: External ear normal.  Left Ear: External ear normal.  Eyes: Conjunctivae are normal.  Neck: Normal range of motion.  Cardiovascular: Normal rate and regular rhythm.  No lower extremity swelling or edema.  Pulmonary/Chest: Effort normal. No stridor. No respiratory distress. She has no wheezes. She has no rales.  Abdominal: Soft. She exhibits no distension. There is no tenderness. There is no guarding.  Mild left CVA tenderness.  No right upper quadrant tenderness.  Musculoskeletal: Normal range of motion.  Neurological: She is alert and oriented to person, place, and time.  Skin: Skin is warm. No rash noted.  Psychiatric: She has a normal mood and affect. Her behavior is normal. Thought content normal.     UC Treatments / Results  Labs (all labs ordered are listed, but only abnormal results are displayed) Labs Reviewed  URINALYSIS, COMPLETE (UACMP) WITH MICROSCOPIC - Abnormal; Notable for the following components:      Result Value   Specific Gravity, Urine >1.030 (*)    Bilirubin Urine SMALL (*)    All other components within normal limits  PREGNANCY, URINE    EKG None  Radiology No results found.  Procedures Procedures (including critical care time)  Medications Ordered in UC Medications  ketorolac (TORADOL) 30 MG/ML injection 30 mg (30 mg Intramuscular Given 12/11/17 1727)  ondansetron (ZOFRAN-ODT)  disintegrating tablet 4 mg (4 mg Oral Given 12/11/17 1726)    Initial Impression / Assessment and Plan / UC Course  I have reviewed the triage vital signs and the nursing notes.  Pertinent labs & imaging results that were available during my care of the patient were reviewed by me and considered in my medical decision making (see chart for details).    32 year old female with 2-day history of left flank pain radiating into the left abdomen over the last 2 days.  She describes the pain as sharp.  She has not had any fevers but has had mild nausea.  She states the pain mimics her previous kidney stones.  Her pregnancy test negative, urinalysis normal.  Patient given Toradol 30 mg IM, pain improved.  She was also given Zofran ODT which resolved her nausea.  Patient noted to be hypertensive but no headaches, vision changes, chest pain or shortness of breath.  Encourage patient to monitor blood pressure at home, if any increasing blood pressure return to the urgent care clinic.  Patient also to return to clinic for any increasing abdominal pain, flank pain, difficulty urinating or fevers.  Final Clinical Impressions(s) / UC Diagnoses   Final diagnoses:  Renal colic on left side     Discharge Instructions     Please make sure you are drinking lots of fluids.  Take medications as needed for pain patient Norco as needed for pain Zofran as needed for nausea.  If any increasing pain, fevers please return to the ER or clinic for further evaluation.   ED Prescriptions    Medication Sig Dispense Auth. Provider   HYDROcodone-acetaminophen (NORCO) 5-325 MG tablet Take 1 tablet by mouth every 6 (six) hours as needed  for moderate pain. 20 tablet Evon Slack, PA-C   ondansetron (ZOFRAN ODT) 4 MG disintegrating tablet Take 1 tablet (4 mg total) by mouth every 8 (eight) hours as needed for nausea or vomiting. 15 tablet Evon Slack, PA-C     Controlled Substance Prescriptions [place] Controlled  Substance Registry consulted? Yes, I have consulted the [place] Controlled Substances Registry for this patient, and feel the risk/benefit ratio today is favorable for proceeding with this prescription for a controlled substance.   Evon Slack, [place] 12/11/17 1803

## 2017-12-15 MED FILL — VYVANSE 40 MG CAPSULE: 40 | 30 days supply | Qty: 30 | Fill #0

## 2017-12-15 MED FILL — GABAPENTIN 100 MG CAPSULE: 100 | 30 days supply | Qty: 90 | Fill #2

## 2017-12-21 ENCOUNTER — Emergency Department
Admission: EM | Admit: 2017-12-21 | Discharge: 2017-12-21 | Disposition: A | Discharge status: Home / Self Care | Payer: 59 | Source: Home / Self Care | Attending: Family Medicine | Admitting: Family Medicine

## 2017-12-21 ENCOUNTER — Other Ambulatory Visit: Payer: Self-pay

## 2017-12-21 ENCOUNTER — Emergency Department (INDEPENDENT_AMBULATORY_CARE_PROVIDER_SITE_OTHER): Payer: 59

## 2017-12-21 DIAGNOSIS — W010XXA Fall on same level from slipping, tripping and stumbling without subsequent striking against object, initial encounter: Secondary | ICD-10-CM

## 2017-12-21 DIAGNOSIS — M79641 Pain in right hand: Secondary | ICD-10-CM | POA: Diagnosis not present

## 2017-12-21 DIAGNOSIS — M25531 Pain in right wrist: Secondary | ICD-10-CM

## 2017-12-21 DIAGNOSIS — S6991XA Unspecified injury of right wrist, hand and finger(s), initial encounter: Secondary | ICD-10-CM | POA: Diagnosis not present

## 2017-12-21 DIAGNOSIS — S6391XA Sprain of unspecified part of right wrist and hand, initial encounter: Secondary | ICD-10-CM | POA: Diagnosis not present

## 2017-12-21 DIAGNOSIS — M79642 Pain in left hand: Secondary | ICD-10-CM

## 2017-12-21 DIAGNOSIS — S6392XA Sprain of unspecified part of left wrist and hand, initial encounter: Secondary | ICD-10-CM

## 2017-12-21 DIAGNOSIS — S6992XA Unspecified injury of left wrist, hand and finger(s), initial encounter: Secondary | ICD-10-CM | POA: Diagnosis not present

## 2017-12-21 IMAGING — DX DG WRIST COMPLETE 3+V*R*
4 series · 4 of 4 positions shown · non-contrast
Comparison: [DATE]

CLINICAL DATA: Right wrist pain after fall yesterday.

EXAM:
RIGHT WRIST - COMPLETE 3+ VIEW

[wrist pa]
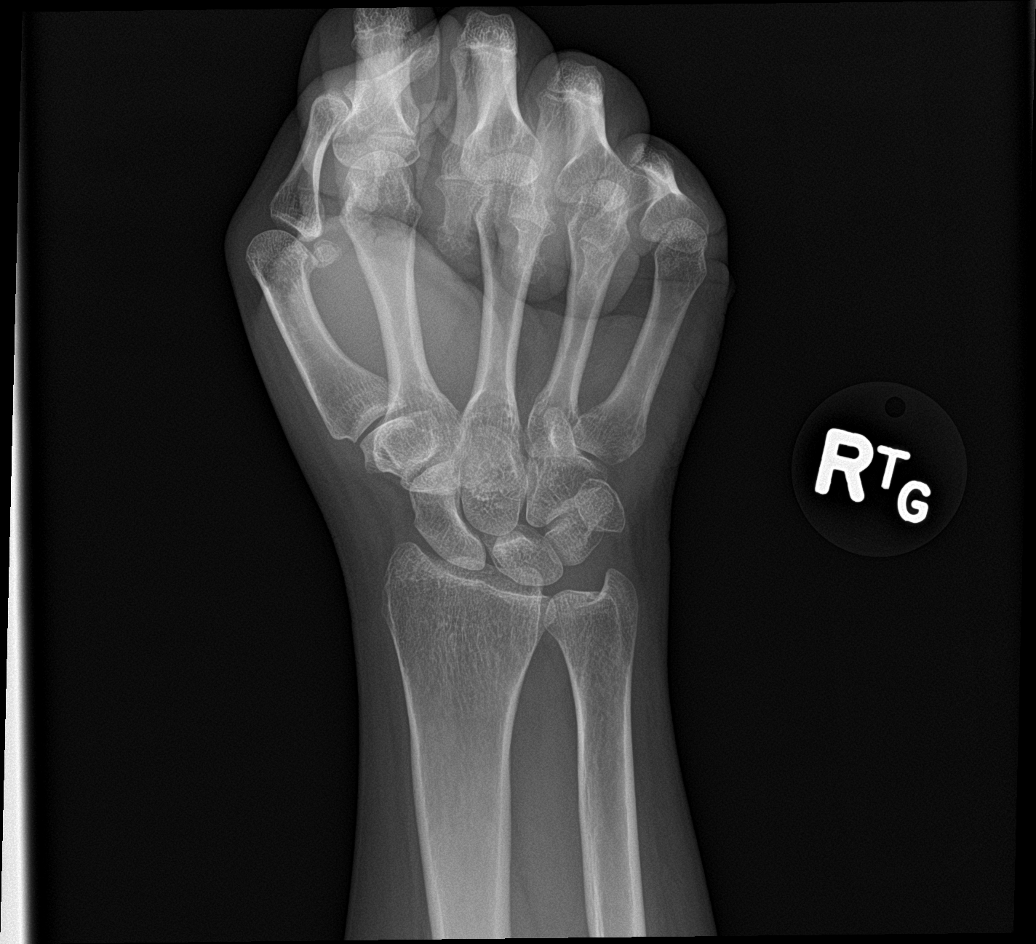

[wrist obl]
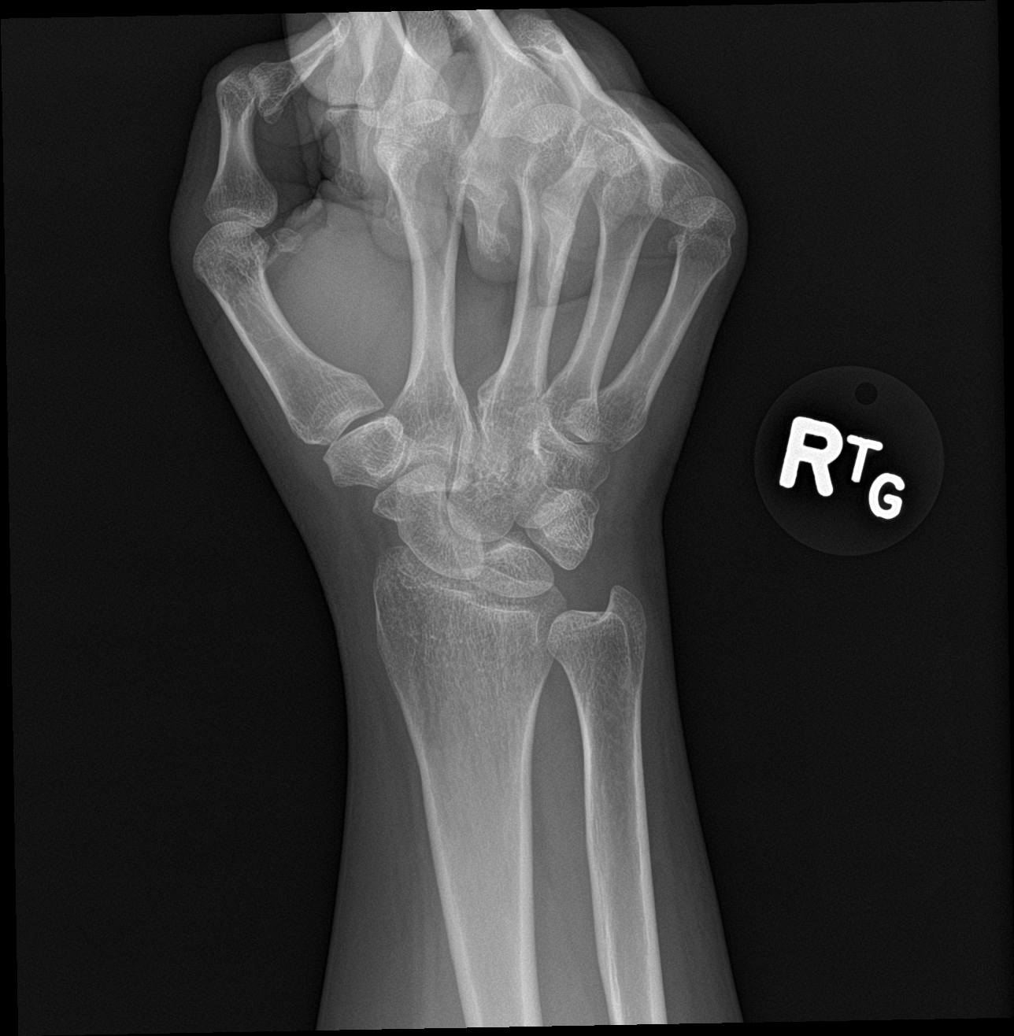

[wrist lat]
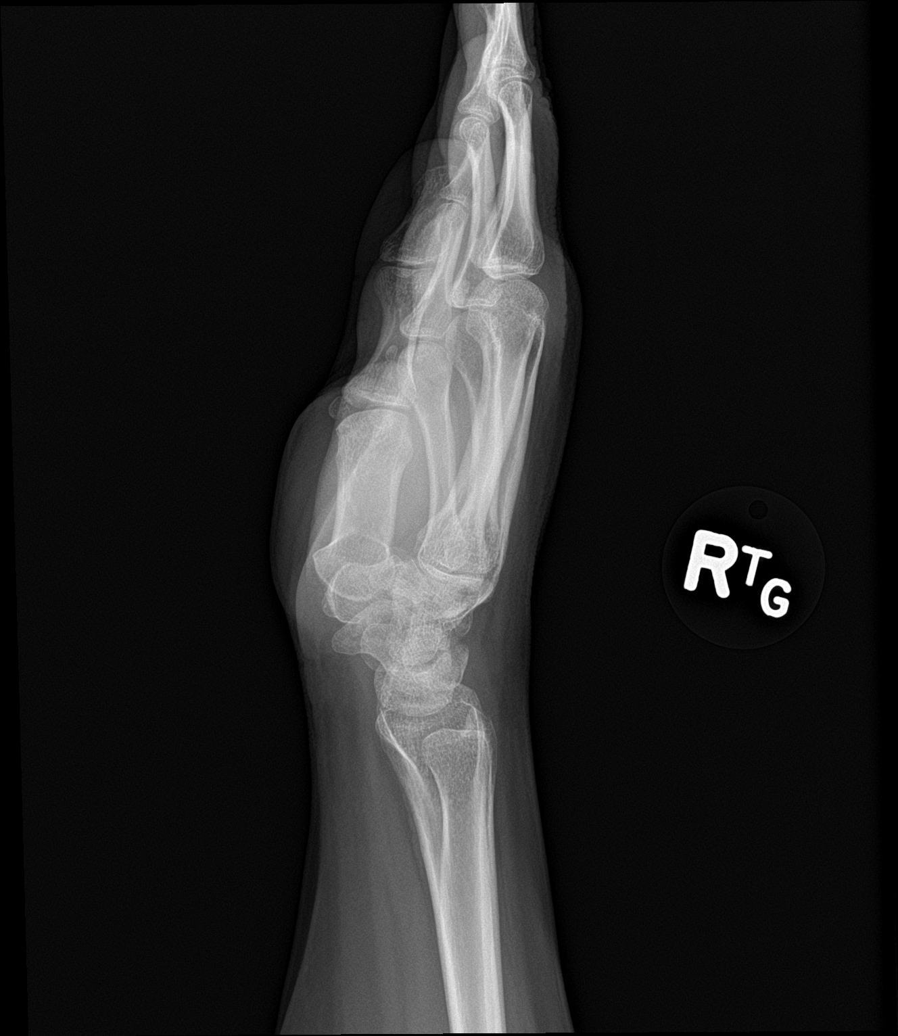

[wrist navicular]
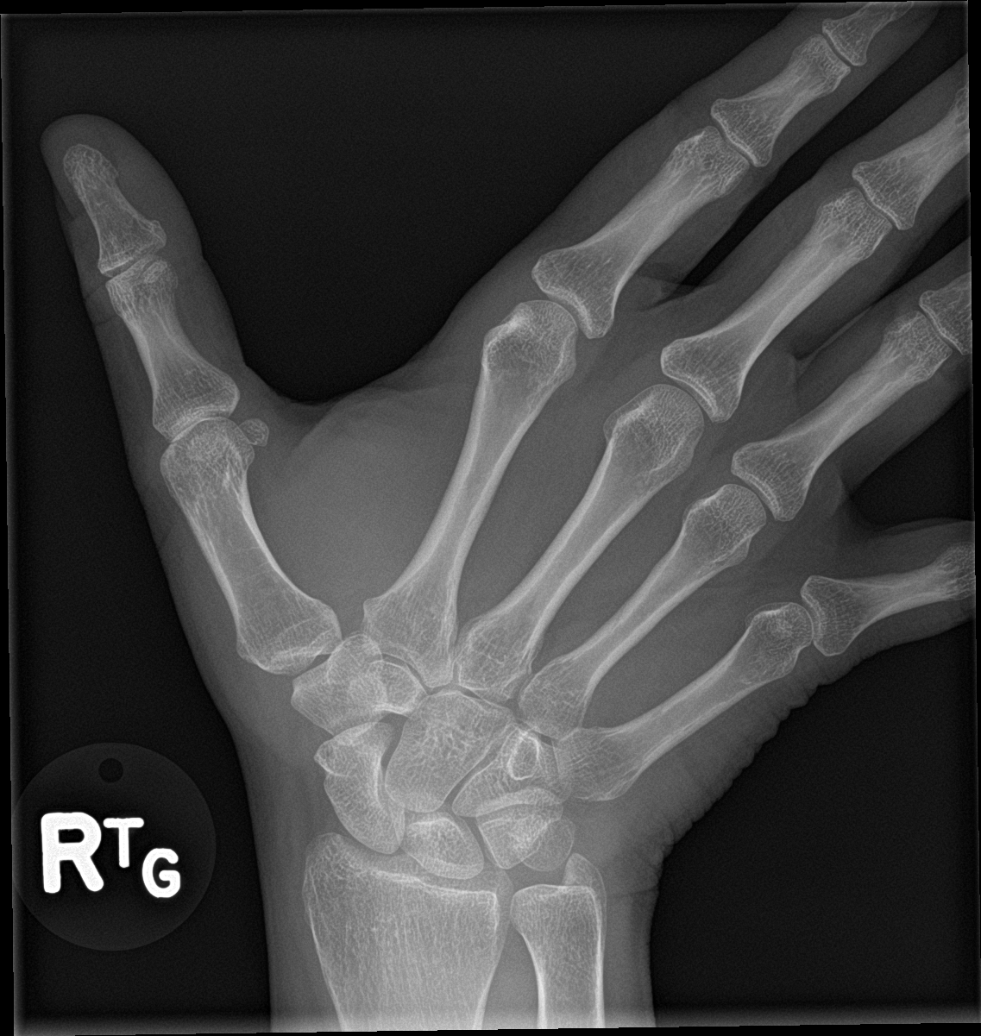

[4 of 4 positions shown; findings below may reference images not displayed]

FINDINGS: There is no evidence of fracture or dislocation. There is no
evidence of arthropathy or other focal bone abnormality. Soft
tissues are unremarkable.
IMPRESSION: Negative.

## 2017-12-21 IMAGING — DX DG HAND COMPLETE 3+V*R*
3 series · 3 of 3 positions shown · non-contrast
Comparison: Right wrist series today reported separately. Right
wrist series [DATE].

CLINICAL DATA: 31-year-old female status post fall yesterday with
pain in the right hand, thumb and wrist.

EXAM:
RIGHT HAND - COMPLETE 3+ VIEW

[hand pa]
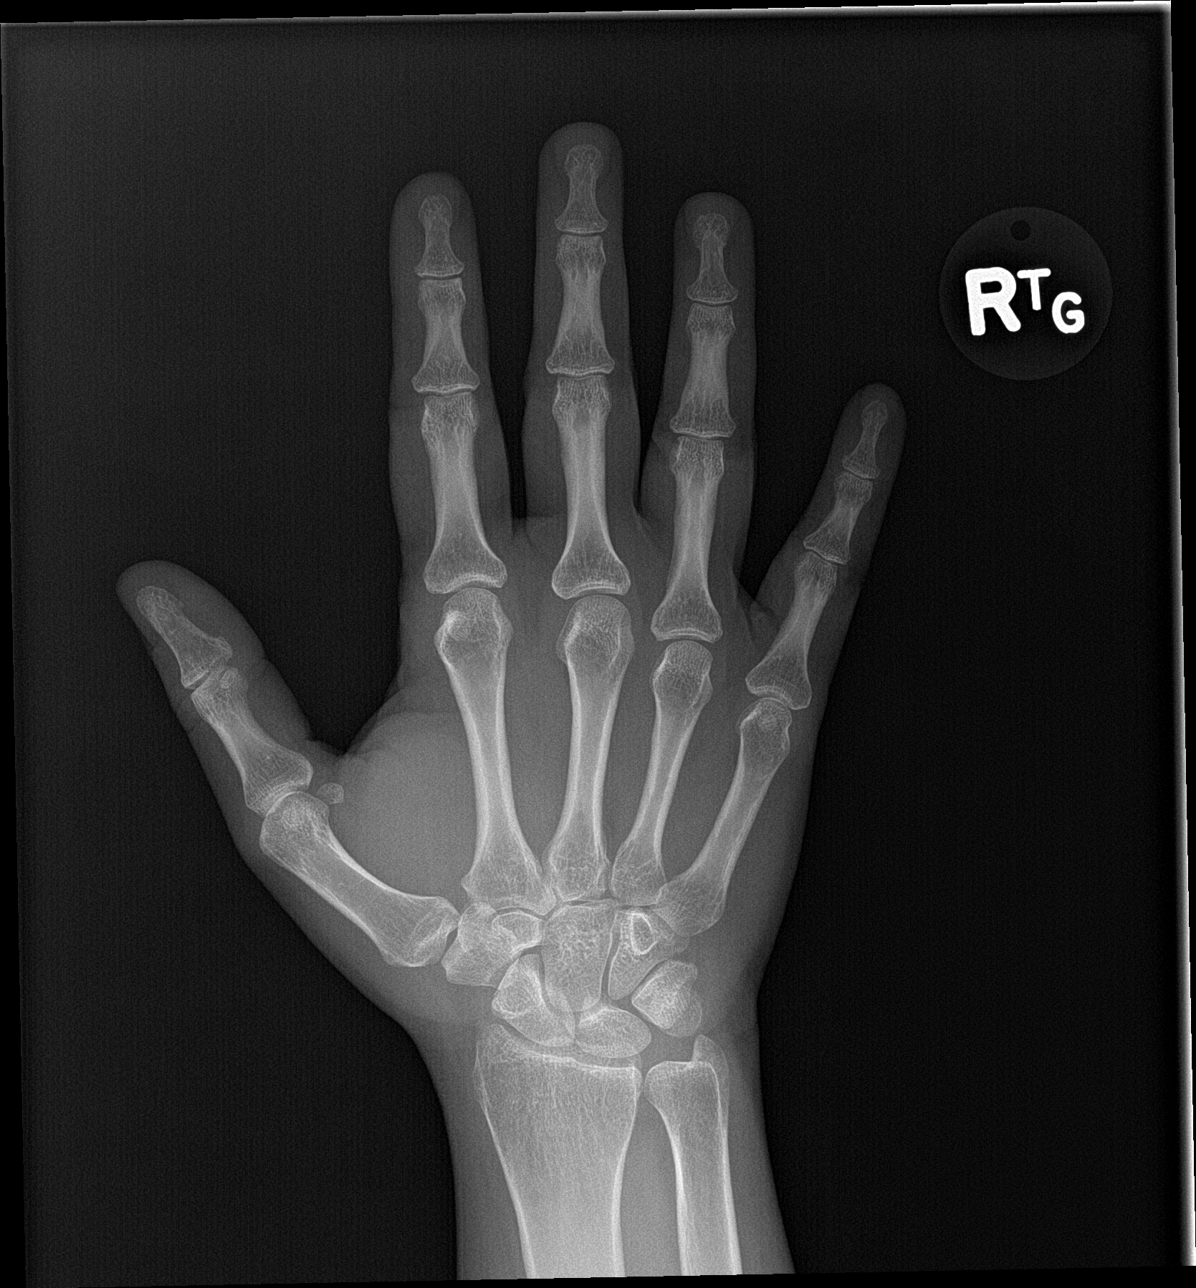

[hand obl]
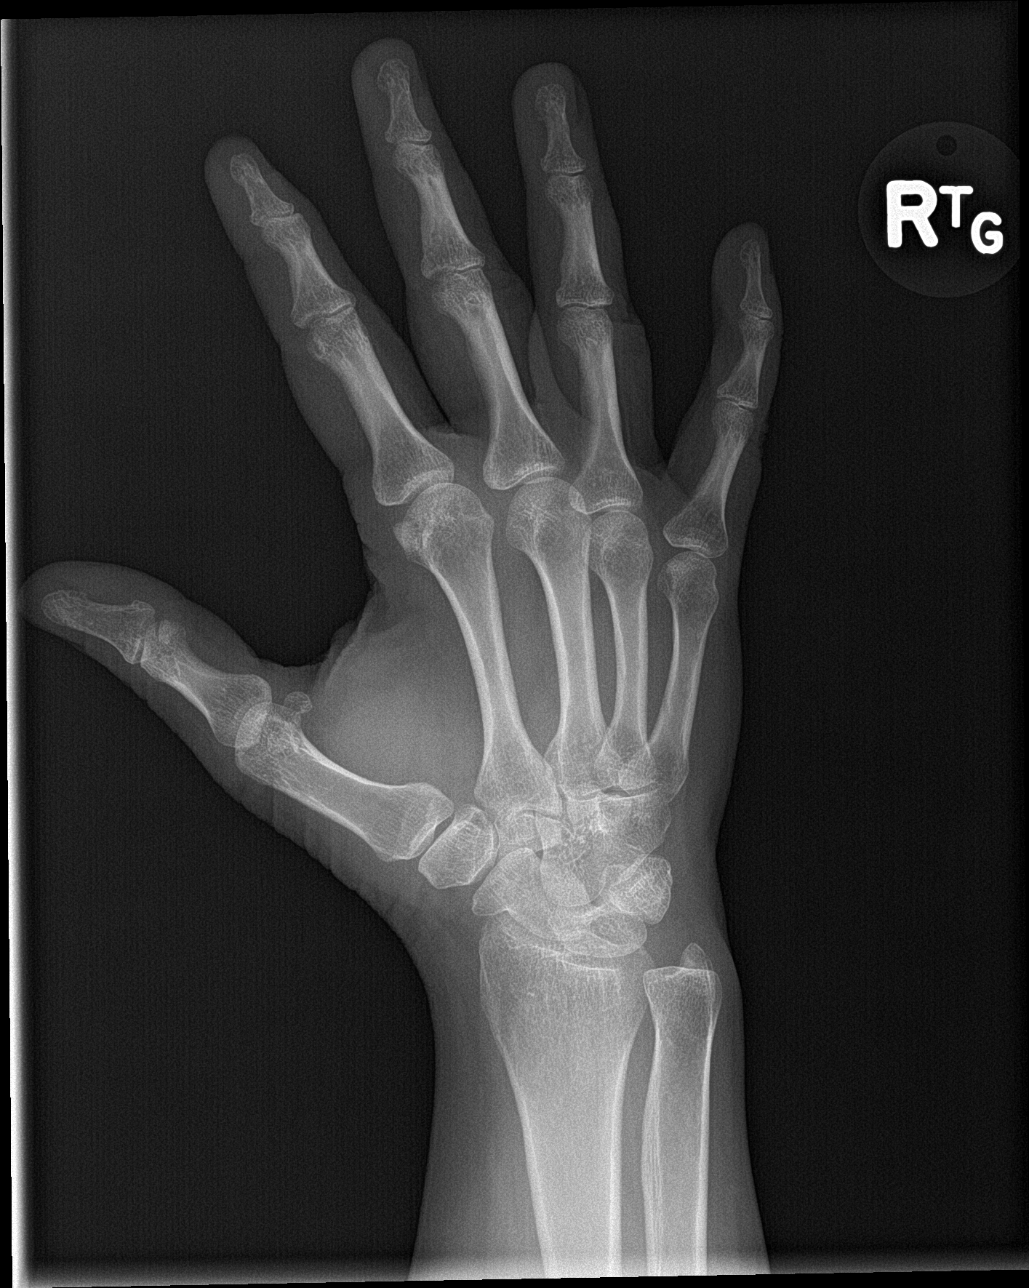

[hand lat]
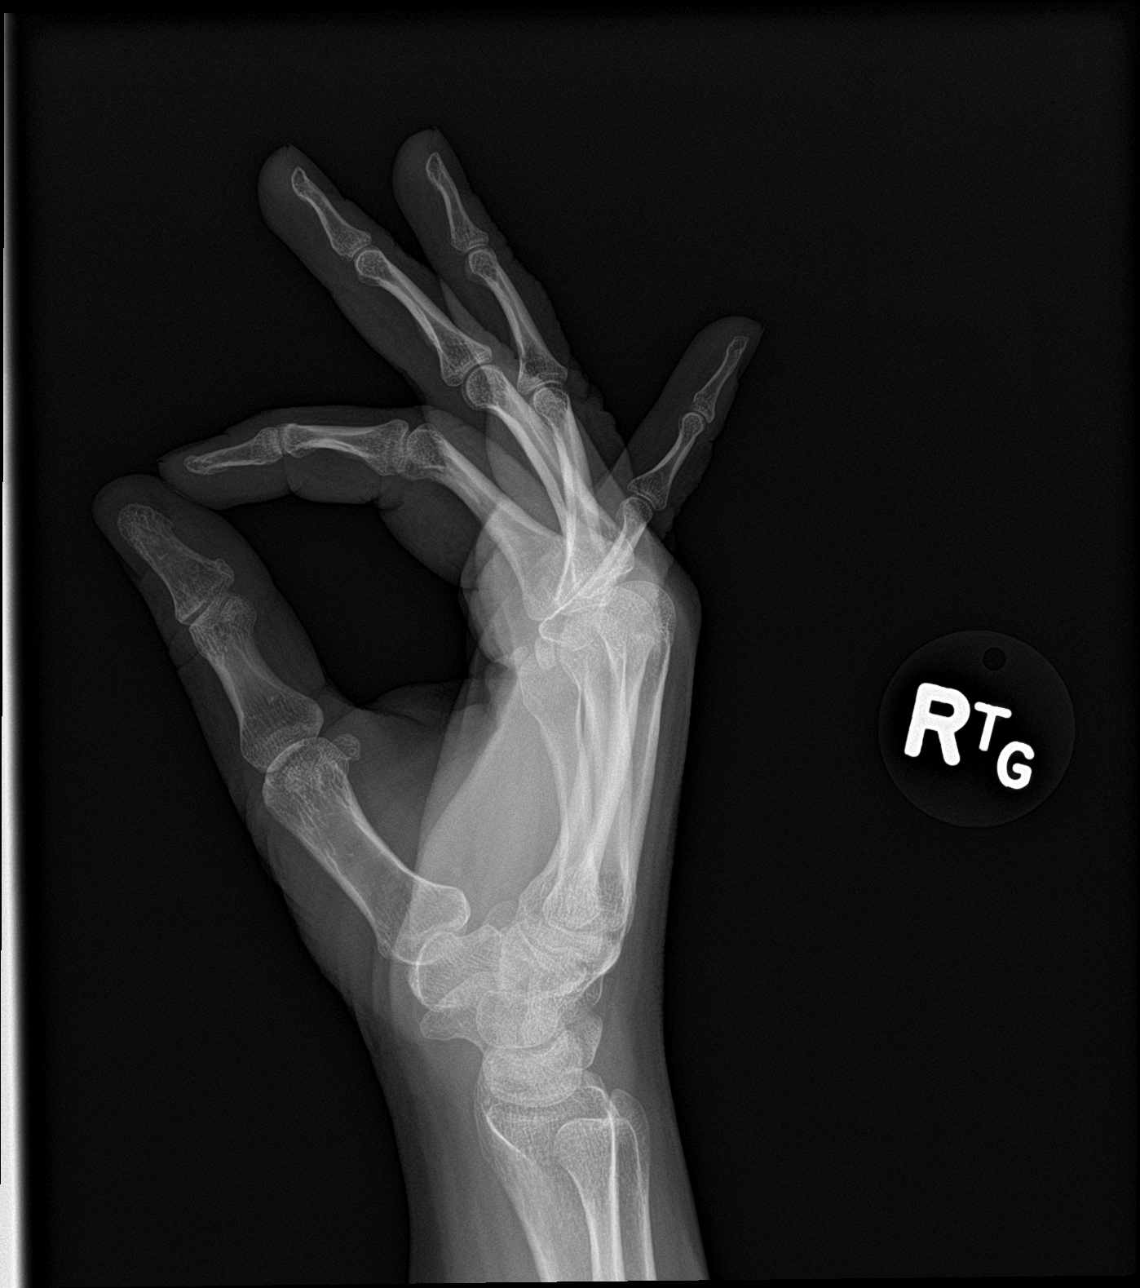

[3 of 3 positions shown; findings below may reference images not displayed]

FINDINGS: The distal radius and ulna appear stable since [ZV]. Carpal bone
alignment and joint spaces within normal limits. Metacarpals are
intact. Phalanges are intact. No discrete soft tissue injury
identified.
IMPRESSION: No acute fracture or dislocation identified in the right hand.

## 2017-12-21 IMAGING — DX DG HAND COMPLETE 3+V*L*
3 series · 3 of 3 positions shown · non-contrast
Comparison: None.

CLINICAL DATA: Patient fell yesterday catching herself with both
hands. Hand pain worse along the fifth metacarpal left hand.

EXAM:
LEFT HAND - COMPLETE 3+ VIEW

[hand pa]
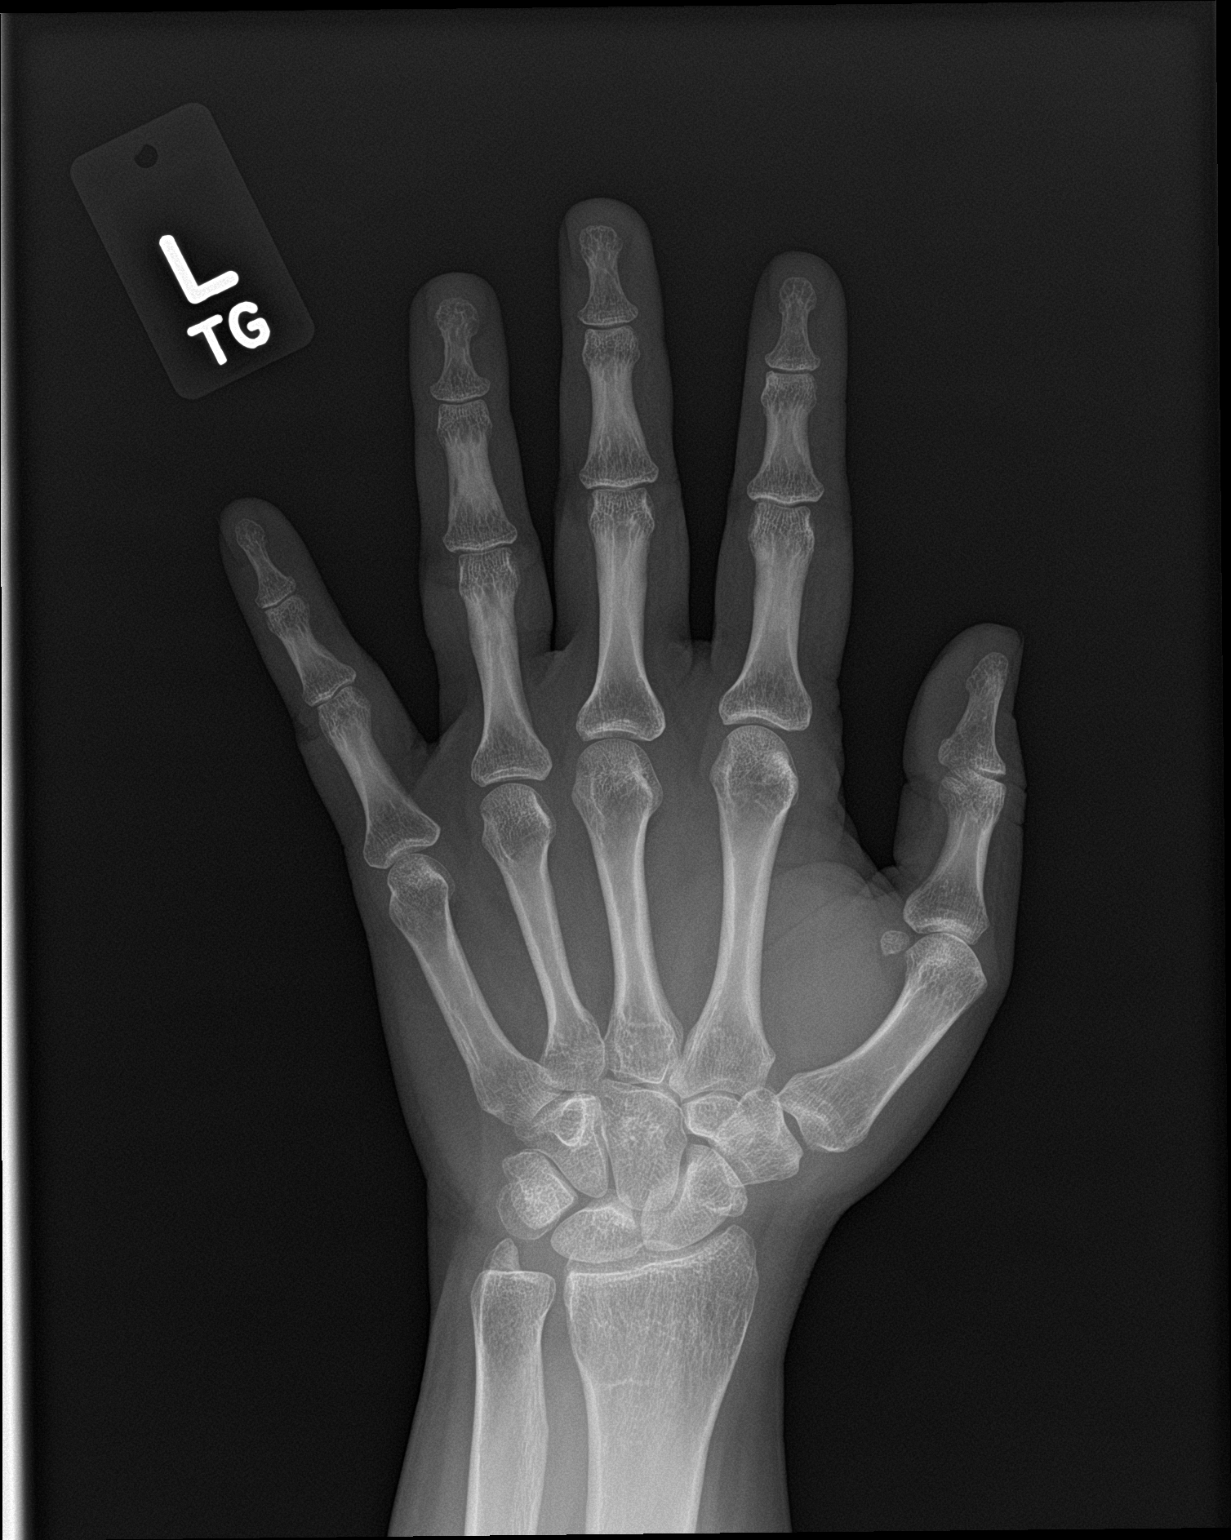

[hand obl]
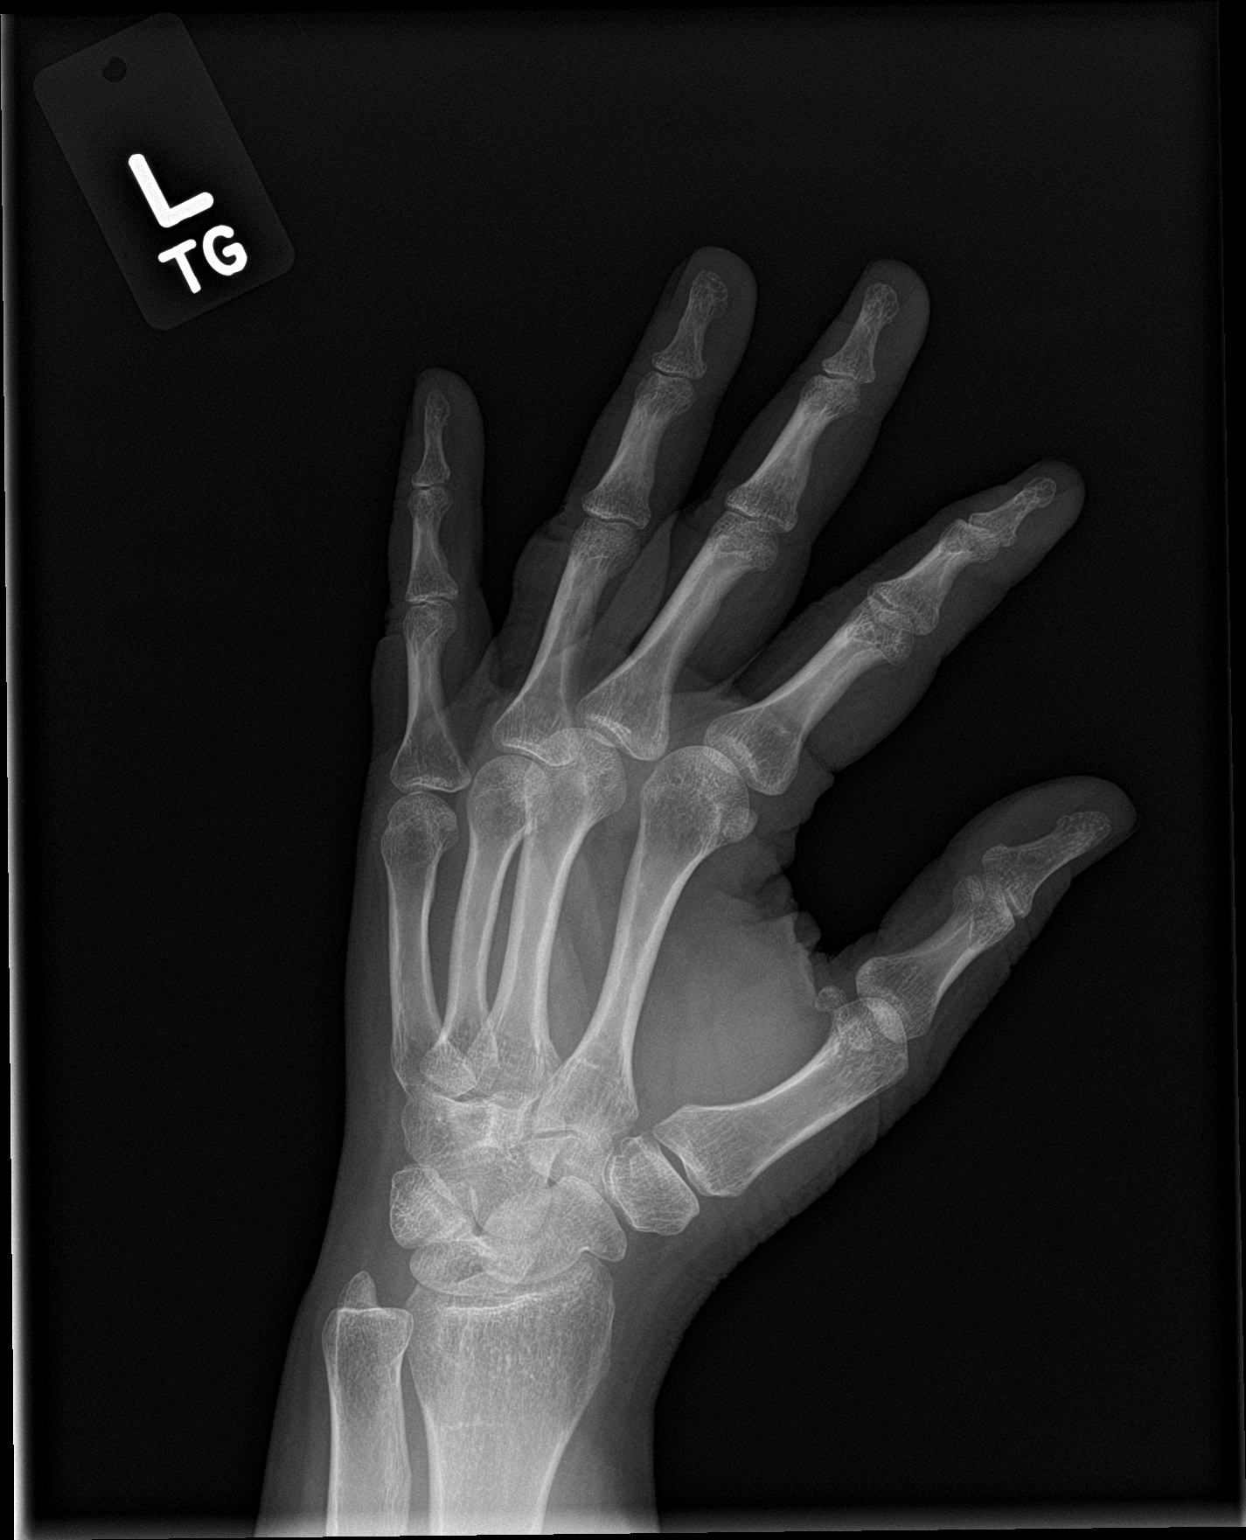

[hand lat]
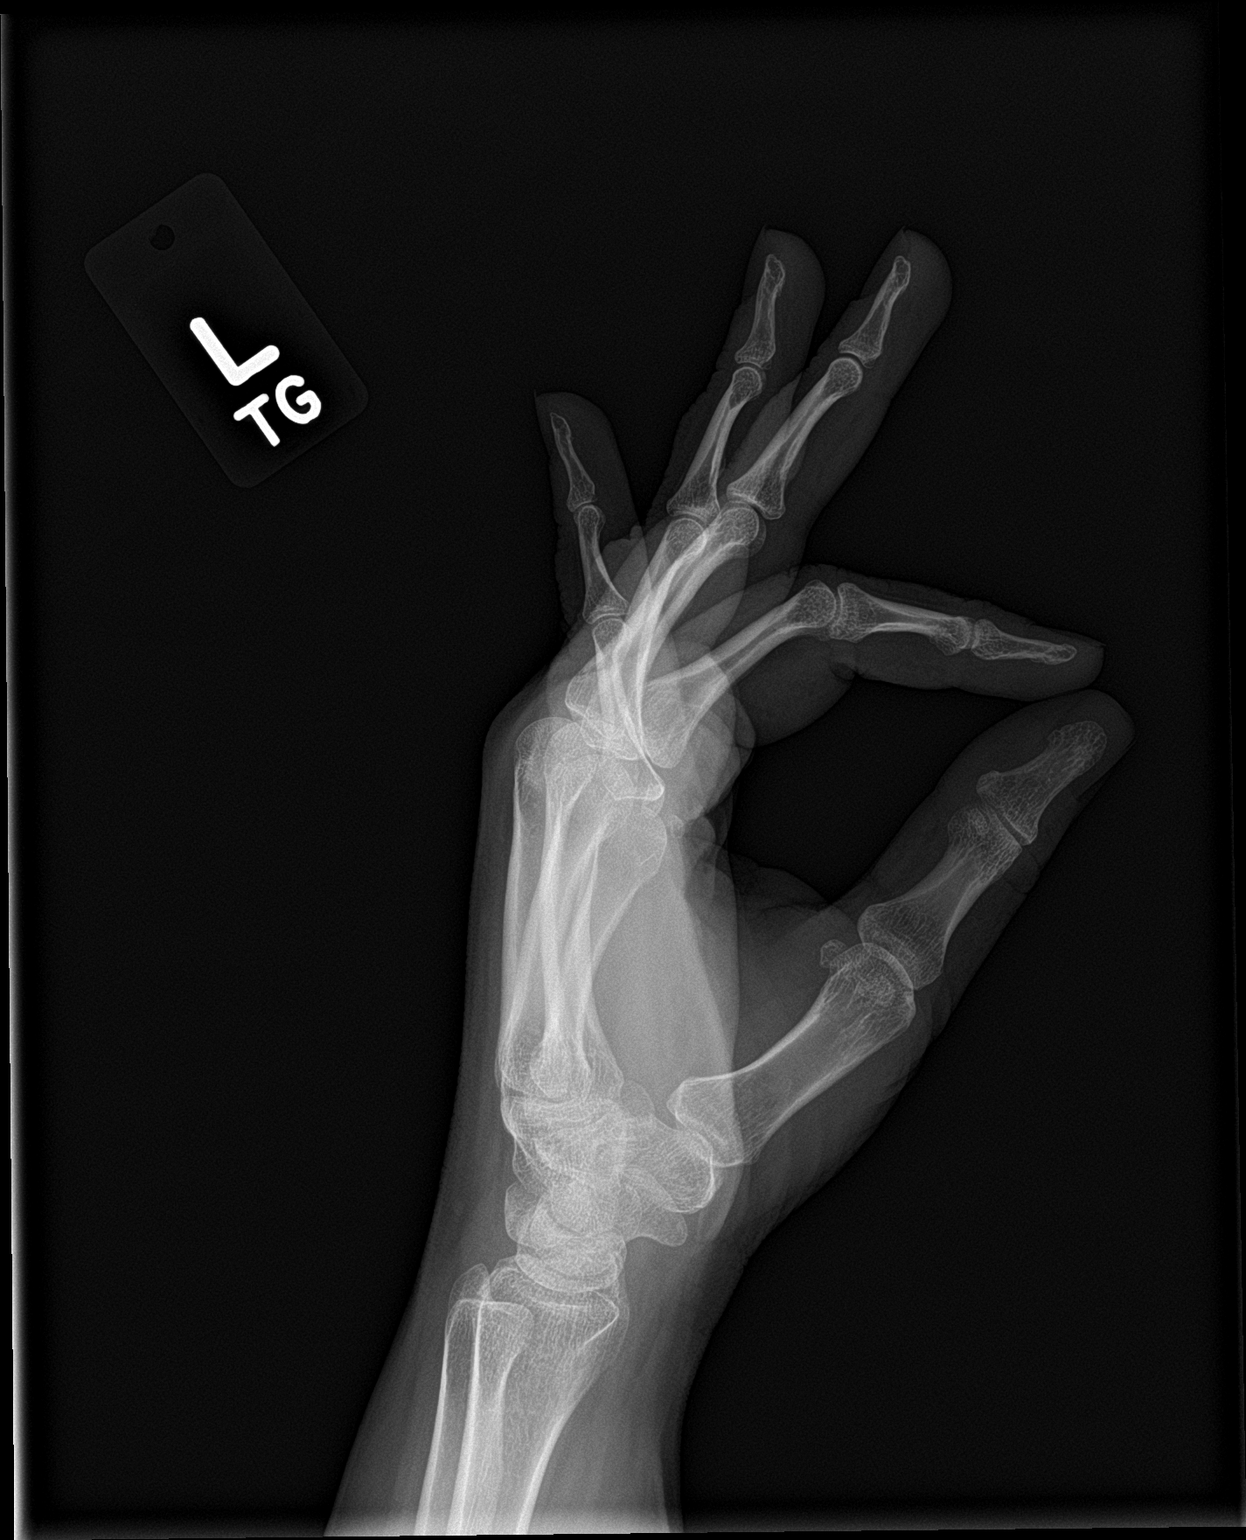

[3 of 3 positions shown; findings below may reference images not displayed]

FINDINGS: There is no evidence of fracture or dislocation. There is no
evidence of arthropathy or other focal bone abnormality. Soft
tissues are unremarkable.
IMPRESSION: Negative.

## 2017-12-21 NOTE — ED Triage Notes (Signed)
P was cleaning yesterday, and fell over a cat onto both hands.  Pain with gripping on both hands,

## 2017-12-21 NOTE — Discharge Instructions (Signed)
°  You may take 500mg acetaminophen every 4-6 hours or in combination with ibuprofen 400-600mg every 6-8 hours as needed for pain and inflammation. ° °Please follow up with family medicine or sports medicine in 1-2 weeks if not improving.  °

## 2017-12-21 NOTE — ED Provider Notes (Signed)
[facility]    CSN: [id number] Arrival date & time: 12/21/17  1827     History   Chief Complaint Chief Complaint  Patient presents with  . Hand Pain    HPI [name] [name] is a 32 y.o. female.   HPI  [name] [name] is a 32 y.o. female presenting to UC with c/o bilateral hand pain and Right wrist pain after trip and fall yesterday while cleaning. She landed on both hands out in front of her to catch her fall. Pain is aching and sore. Worse around Right thumb and Left little finger.  Pain worse with making a fist, moderate in severity. She is Left hand dominant. No other injuries from fall.   Past Medical History:  Diagnosis Date  . Anemia   . Anxiety   . DEPRESSION   . GERD (gastroesophageal reflux disease)   . Hypothyroidism    Hx of, normalized TSH during pregnancy 2011  . Infertility, female   . Migraines   . Morbid obesity (HCC)    s/p RY 08/2012 - start weight 290 pounds  . Nephrolithiasis   . PCOS (polycystic ovarian syndrome)   . PONV (postoperative nausea and vomiting)   . Pregnancy induced hypertension     Patient Active Problem List   Diagnosis Date Noted  . Bleeding diathesis (HCC) 10/13/2017  . Depressive disorder 07/09/2017  . Dysmenorrhea 07/09/2017  . Menorrhagia 07/09/2017  . Reduced libido 07/09/2017  . History of cesarean section 07/09/2017  . Binge eating disorder 04/23/2017  . Morbid obesity (HCC) 04/23/2017  . 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 Pes planus 07/16/2016  . Hallux varus, acquired, right 03/18/2016  . Decreased attention Span 05/28/2015  . Sleep disturbance 05/28/2015  . Iron deficiency anemia 02/22/2015  . Kidney stone 08/24/2013  . 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 Anxiety 04/10/2010  . Elevated blood pressure 04/05/2010    Past Surgical History:  Procedure Laterality Date  . Quintella ReichertIKEN OSTEOTOMY Left 06/24/2017   Procedure: Ralene BatheAIKEN OSTEOTOMY;  Surgeon: Vivi BarrackWagoner, Matthew R, DPM;   Location: [facility];  Service: Podiatry;  Laterality: Left;  . BUNIONECTOMY Left 06/24/2017   Procedure: Anthoney HaradaALSTON BUNIONECTOMY;  Surgeon: Vivi BarrackWagoner, Matthew R, DPM;  Location: [facility];  Service: Podiatry;  Laterality: Left;  . CESAREAN SECTION     x 2  . FOOT SURGERY    . 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   Procedure: LAPAROSCOPIC ROUX-EN-Y GASTRIC;  Surgeon: Atilano InaEric M Wilson, MD;  Location: [facility];  Service: General;  Laterality: N/A;  laparoscopic roux-en-y gastric bypass  . LITHOTRIPSY Left   . TONSILLECTOMY    . TUBAL LIGATION    . UPPER GI ENDOSCOPY  08/24/2012   Procedure: UPPER GI ENDOSCOPY;  Surgeon: Atilano InaEric M Wilson, MD;  Location: [facility];  Service: General;;  . WISDOM TOOTH EXTRACTION      OB History    Gravida  2   Para  2   Term  2   Preterm  0   AB  0   Living  2     SAB  0   TAB  0   Ectopic  0   Multiple  0   Live Births  2            Home Medications    Prior to Admission medications   Medication Sig Start Date End Date Taking? Authorizing Provider  gabapentin (NEURONTIN) 100 MG capsule 100mg  in the morning  and 200 mg at night 10/13/17   Helane Rima, DO  HYDROcodone-acetaminophen (NORCO) 5-325 MG tablet Take 1 tablet by mouth every 6 (six) hours as needed for moderate pain. 12/11/17   Evon Slack, PA-C  lisdexamfetamine (VYVANSE) 40 MG capsule Take 1 capsule (40 mg total) by mouth every morning. 10/13/17   Helane Rima, DO  lisdexamfetamine (VYVANSE) 40 MG capsule Take 1 capsule (40 mg total) by mouth every morning. 11/12/17   Helane Rima, DO  lisdexamfetamine (VYVANSE) 40 MG capsule Take 1 capsule (40 mg total) by mouth every morning. 12/12/17   Helane Rima, DO  meloxicam (MOBIC) 15 MG tablet Take 1 tablet (15 mg total) by mouth daily. 10/13/17   Helane Rima, DO  ondansetron (ZOFRAN ODT) 4 MG disintegrating tablet Take 1 tablet (4 mg total) by mouth every 8 (eight) hours as needed for nausea or vomiting. 12/11/17    Evon Slack, PA-C    Family History Family History  Problem Relation Age of Onset  . Hyperlipidemia Father   . Hypertension Father   . Kidney disease Father   . Colon cancer Father 23  . Hypertension Mother   . Hypertension Maternal Grandmother   . Uterine cancer Maternal Grandmother 23  . Diabetes Maternal Grandfather     Social History Social History   Tobacco Use  . Smoking status: Never Smoker  . Smokeless tobacco: Never Used  Substance Use Topics  . Alcohol use: No    Comment: rare/socially  . Drug use: No     Allergies   Patient has no known allergies.   Review of Systems Review of Systems  Musculoskeletal: Positive for arthralgias. Negative for joint swelling and myalgias.  Skin: Negative for color change and wound.  Neurological: Negative for weakness and numbness.     Physical Exam Triage Vital Signs ED Triage Vitals  Enc Vitals Group     BP      Pulse      Resp      Temp      Temp src      SpO2      Weight      Height      Head Circumference      Peak Flow      Pain Score      Pain Loc      Pain Edu?      Excl. in GC?    No data found.  Updated Vital Signs BP 125/89 (BP Location: Right Arm)   Pulse 100   Temp 98 F (36.7 C) (Oral)   Ht 5\' 4"  (1.626 m)   Wt 231 lb (104.8 kg)   SpO2 100%   BMI 39.65 kg/m   Visual Acuity Right Eye Distance:   Left Eye Distance:   Bilateral Distance:    Right Eye Near:   Left Eye Near:    Bilateral Near:     Physical Exam  Constitutional: She is oriented to person, place, and time. She appears well-developed and well-nourished. No distress.  HENT:  Head: Normocephalic and atraumatic.  Eyes: EOM are normal.  Neck: Normal range of motion.  Cardiovascular: Normal rate.  Pulses:      Radial pulses are 2+ on the right side, and 2+ on the left side.  Pulmonary/Chest: Effort normal.  Musculoskeletal: Normal range of motion. She exhibits tenderness. She exhibits no edema.  Right wrist  and hand: no edema or deformity. Tenderness along radial aspect. Full ROM. Tenderness at base of thumb.  Left wrist and hand: no edema or deformity. Full ROM. Tenderness along ulnar aspect of hand and into little finger. No wrist tenderness  Neurological: She is alert and oriented to person, place, and time.  Skin: Skin is warm and dry. Capillary refill takes less than 2 seconds. She is not diaphoretic.  Bilateral hands and wrists: skin in tact. Faint ecchymosis of Left little finger.  Psychiatric: She has a normal mood and affect. Her behavior is normal.  Nursing note and vitals reviewed.    UC Treatments / Results  Labs (all labs ordered are listed, but only abnormal results are displayed) Labs Reviewed - No data to display  EKG None  Radiology Dg Wrist Complete Right  Result Date: 12/21/2017 CLINICAL DATA: Right wrist pain after fall yesterday. EXAM: RIGHT WRIST - COMPLETE 3+ VIEW COMPARISON:  05/10/2007 FINDINGS: There is no evidence of fracture or dislocation. There is no evidence of arthropathy or other focal bone abnormality. Soft tissues are unremarkable. IMPRESSION: Negative. Electronically Signed   By: Tollie Eth M.D.   On: 12/21/2017 19:21   Dg Hand Complete Left  Result Date: 12/21/2017 CLINICAL DATA:  Patient fell yesterday catching herself with both hands. Hand pain worse along the fifth metacarpal left hand. EXAM: LEFT HAND - COMPLETE 3+ VIEW COMPARISON:  None. FINDINGS: There is no evidence of fracture or dislocation. There is no evidence of arthropathy or other focal bone abnormality. Soft tissues are unremarkable. IMPRESSION: Negative. Electronically Signed   By: Tollie Eth M.D.   On: 12/21/2017 19:20   Dg Hand Complete Right  Result Date: 12/21/2017 CLINICAL DATA:  32 year old female status post fall yesterday with pain in the right hand, thumb and wrist. EXAM: RIGHT HAND - COMPLETE 3+ VIEW COMPARISON:  Right wrist series today reported separately. Right wrist series  05/10/2007. FINDINGS: The distal radius and ulna appear stable since 2008. Carpal bone alignment and joint spaces within normal limits. Metacarpals are intact. Phalanges are intact. No discrete soft tissue injury identified. IMPRESSION: No acute fracture or dislocation identified in the right hand. Electronically Signed   By: Odessa Fleming M.D.   On: 12/21/2017 19:22    Procedures Procedures (including critical care time)  Medications Ordered in UC Medications - No data to display  Initial Impression / Assessment and Plan / UC Course  I have reviewed the triage vital signs and the nursing notes.  Pertinent labs & imaging results that were available during my care of the patient were reviewed by me and considered in my medical decision making (see chart for details).    Discussed imaging with pt Will tx as sprains   Final Clinical Impressions(s) / UC Diagnoses   Final diagnoses:  Right hand pain  Fall from slip, trip, or stumble, initial encounter  Hand sprain, right, initial encounter  Hand sprain, left, initial encounter     Discharge Instructions      You may take 500mg  acetaminophen every 4-6 hours or in combination with ibuprofen 400-600mg  every 6-8 hours as needed for pain and inflammation.  Please follow up with family medicine or sports medicine in 1-2 weeks if not improving.     ED Prescriptions    None     Controlled Substance Prescriptions [place] Controlled Substance Registry consulted? Not Applicable   Rolla Plate 12/21/17 1944

## 2017-12-23 ENCOUNTER — Encounter: Payer: Self-pay | Admitting: Family Medicine

## 2017-12-25 ENCOUNTER — Encounter: Payer: Self-pay | Admitting: Physician Assistant

## 2017-12-25 ENCOUNTER — Ambulatory Visit: Payer: 59 | Admitting: Physician Assistant

## 2017-12-25 ENCOUNTER — Telehealth: Payer: Self-pay | Admitting: Family Medicine

## 2017-12-25 VITALS — BP 120/80 | HR 84 | Temp 98.4°F | Ht 64.0 in | Wt 227.5 lb

## 2017-12-25 DIAGNOSIS — M79642 Pain in left hand: Secondary | ICD-10-CM | POA: Diagnosis not present

## 2017-12-25 DIAGNOSIS — M25531 Pain in right wrist: Secondary | ICD-10-CM | POA: Diagnosis not present

## 2017-12-25 MED ORDER — HYDROCODONE-ACETAMINOPHEN 5-325 MG PO TABS: 1.0000 | ORAL_TABLET | Freq: Four times a day (QID) | ORAL | 0 refills | Status: AC | PRN

## 2017-12-25 MED FILL — HYDROCODON-APAP 5-325: 5-325 | 5 days supply | Qty: 20 | Fill #0

## 2017-12-25 NOTE — Telephone Encounter (Signed)
Please resend

## 2017-12-25 NOTE — Telephone Encounter (Signed)
Please call pharmacy and inquire if the medication was received.  Jarold MottoSamantha Kamalani Mastro PA-C

## 2017-12-25 NOTE — Patient Instructions (Signed)
It was great to see you.  Please make an appointment to follow-up with Dr. Berline Choughigby next week.

## 2017-12-25 NOTE — Progress Notes (Signed)
[name] [name] is a 32 y.o. female here for a follow up of a pre-existing problem.  I acted as a Neurosurgeon for [employer], PA-C Corky Mull, LPN  History of Present Illness:   Chief Complaint  Patient presents with  . Wrist Pain    Wrist Pain   The pain is present in the right wrist. Episode onset: Pt here to follow up on wrist injury that occurred on 7/7, pt was seen at Urgent care on 7/8. There has been a history of trauma (Pt tripped and fell over cat and used her hands to brace herself when she fell.). The problem occurs constantly. The problem has been unchanged. The quality of the pain is described as sharp and aching. The pain is at a severity of 5/10. The pain is moderate. Associated symptoms include an inability to bear weight (right wrist), joint swelling (Left hand), a limited range of motion (right wrist) and stiffness (right hand and left pinky finger). Pertinent negatives include no fever, numbness or tingling. The symptoms are aggravated by activity. She has tried acetaminophen, NSAIDS, cold and rest for the symptoms. The treatment provided no relief. There is no history of diabetes, gout, osteoarthritis or rheumatoid arthritis.   12/21/17 bilateral hand xrays and R wrist xray were all normal. Reviewed xrays.  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  She has been using a R wrist brace that she was given at the urgent care. Continues to use ice prn.   Past Medical History:  Diagnosis Date  . Anemia   . Anxiety   . DEPRESSION   . GERD (gastroesophageal reflux disease)   . Hypothyroidism    Hx of, normalized TSH during pregnancy 2011  . Infertility, female   . Migraines   . Morbid obesity (HCC)    s/p RY 08/2012 - start weight 290 pounds  . Nephrolithiasis   . PCOS (polycystic ovarian syndrome)   . PONV (postoperative nausea and vomiting)   . Pregnancy induced hypertension      Social History    Socioeconomic History  . Marital status: Married    Spouse name: Not on file  . Number of children: Not on file  . Years of education: Not on file  . Highest education level: Not on file  Occupational History  . Not on file  Social Needs  . Financial resource strain: Not on file  . Food insecurity:    Worry: Not on file    Inability: Not on file  . Transportation needs:    Medical: Not on file    Non-medical: Not on file  Tobacco Use  . Smoking status: Never Smoker  . Smokeless tobacco: Never Used  Substance and Sexual Activity  . Alcohol use: No    Comment: rare/socially  . Drug use: No  . Sexual activity: Yes    Birth control/protection: Other-see comments    Comment: tubal ligation  Lifestyle  . Physical activity:    Days per week: Not on file    Minutes per session: Not on file  . Stress: Not on file  Relationships  . Social connections:    Talks on phone: Not on file    Gets together: Not on file    Attends religious service: Not on file    Active member of club or organization: Not on file    Attends meetings of clubs or organizations: Not on file    Relationship status:  Not on file  . Intimate partner violence:    Fear of current or ex partner: Not on file    Emotionally abused: Not on file    Physically abused: Not on file    Forced sexual activity: Not on file  Other Topics Concern  . Not on file  Social History Narrative   Married, lives with spouse and dtr born 07/2009. 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    Past Surgical History:  Procedure Laterality Date  . Quintella ReichertIKEN OSTEOTOMY Left 06/24/2017   Procedure: Ralene BatheAIKEN OSTEOTOMY;  Surgeon: Vivi BarrackWagoner, Matthew R, DPM;  Location: [facility];  Service: Podiatry;  Laterality: Left;  . BUNIONECTOMY Left 06/24/2017   Procedure: Anthoney HaradaALSTON BUNIONECTOMY;  Surgeon: Vivi BarrackWagoner, Matthew R, DPM;  Location: [facility];  Service: Podiatry;  Laterality: Left;  . CESAREAN SECTION     x 2  . FOOT SURGERY     . 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   Procedure: LAPAROSCOPIC ROUX-EN-Y GASTRIC;  Surgeon: Atilano InaEric M Wilson, MD;  Location: [facility];  Service: General;  Laterality: N/A;  laparoscopic roux-en-y gastric bypass  . LITHOTRIPSY Left   . TONSILLECTOMY    . TUBAL LIGATION    . UPPER GI ENDOSCOPY  08/24/2012   Procedure: UPPER GI ENDOSCOPY;  Surgeon: Atilano InaEric M Wilson, MD;  Location: [facility];  Service: General;;  . WISDOM TOOTH EXTRACTION      Family History  Problem Relation Age of Onset  . Hyperlipidemia Father   . Hypertension Father   . Kidney disease Father   . Colon cancer Father 1650  . Hypertension Mother   . Hypertension Maternal Grandmother   . Uterine cancer Maternal Grandmother [id number]  . Diabetes Maternal Grandfather     No Known Allergies  Current Medications:   Current Outpatient Medications:  .  gabapentin (NEURONTIN) 100 MG capsule, 100mg  in the morning and 200 mg at night, Disp: 90 capsule, Rfl: 2 .  lisdexamfetamine (VYVANSE) 40 MG capsule, Take 1 capsule (40 mg total) by mouth every morning., Disp: 30 capsule, Rfl: 0 .  lisdexamfetamine (VYVANSE) 40 MG capsule, Take 1 capsule (40 mg total) by mouth every morning., Disp: 30 capsule, Rfl: 0 .  lisdexamfetamine (VYVANSE) 40 MG capsule, Take 1 capsule (40 mg total) by mouth every morning., Disp: 30 capsule, Rfl: 0 .  meloxicam (MOBIC) 15 MG tablet, Take 1 tablet (15 mg total) by mouth daily., Disp: 30 tablet, Rfl: 1 .  HYDROcodone-acetaminophen (NORCO/VICODIN) 5-325 MG tablet, Take 1 tablet by mouth every 6 (six) hours as needed for up to 5 days for moderate pain or severe pain., Disp: 20 tablet, Rfl: 0 .  ondansetron (ZOFRAN ODT) 4 MG disintegrating tablet, Take 1 tablet (4 mg total) by mouth every 8 (eight) hours as needed for nausea or vomiting. (Patient not taking: Reported on 12/25/2017), Disp: 15 tablet, Rfl: 0   Review of Systems:   Review of Systems  Constitutional: Negative for fever.  Musculoskeletal: Positive for stiffness  (right hand and left pinky finger). Negative for gout.  Neurological: Negative for tingling and numbness.    Vitals:   Vitals:   12/25/17 0808  BP: 120/80  Pulse: 84  Temp: 98.4 F (36.9 C)  TempSrc: Oral  SpO2: 99%  Weight: 227 lb 8 oz (103.2 kg)  Height: 5\' 4"  (1.626 m)     Body mass index is 39.05 kg/m.  Physical Exam:   Physical Exam  Constitutional: She is oriented to person, place, and time. She  appears well-developed and well-nourished.  HENT:  Head: Normocephalic and atraumatic.  Eyes: Conjunctivae and EOM are normal.  Neck: Normal range of motion. Neck supple.  Pulmonary/Chest: Effort normal.  Musculoskeletal:  Right Wrist: Overall wrist  is well aligned, no significant deformity or atrophy. No significant effusion or swelling.  Grip strength intact. Wrist extension WNL, normal ROM. Mild TTP over the anatomic snuff box, no tenderness to the distal radius & ulna, or proximal & distal carpal rows.  Left Hand: Overall hand is well aligned, no significant deformity or atrophy.  No significant effusion or swelling.   Grip strength intact. No significant TTP over the anatomic snuff box, distal radius & ulna, or proximal & distal carpal rows. Does have small nodule to medial PIP joint on 5th finger.    Neurological: She is alert and oriented to person, place, and time.  Skin: Skin is warm and dry.  Psychiatric: She has a normal mood and affect. Her behavior is normal. Judgment and thought content normal.    Assessment and Plan:    Denay was seen today for wrist pain.  Diagnoses and all orders for this visit:  Right wrist pain -     Ambulatory referral to Sports Medicine  Left hand pain  Other orders -     HYDROcodone-acetaminophen (NORCO/VICODIN) 5-325 MG tablet; Take 1 tablet by mouth every 6 (six) hours as needed for up to 5 days for moderate pain or severe pain.   [place] Database results reviewed with Dr. Helane Rima. Will provide short prescription (5  days) of Vicodin. I discussed my concerns with patient, as she had a bunionectomy in February and had a long course of Vicodin use. She verbalized understanding. I recommended that she follow-up with Dr. Gaspar Bidding for re-evaluation of wrist as she has some mild ongoing wrist tenderness. Patient is agreeable to plan.   . Reviewed expectations re: course of current medical issues. . Discussed self-management of symptoms. . Outlined signs and symptoms indicating need for more acute intervention. . Patient verbalized understanding and all questions were answered. . See orders for this visit as documented in the electronic medical record. . Patient received an After-Visit Summary.  CMA or LPN served as scribe during this visit. History, Physical, and Plan performed by medical provider. Documentation and orders reviewed and attested to.   Jarold Motto, PA-C

## 2017-12-25 NOTE — Telephone Encounter (Signed)
Copied from CRM [id number]. Topic: Quick Communication - See Telephone Encounter >> Dec 25, 2017  9:06 AM Jay SchlichterWeikart, Melissa J wrote: CRM for notification. See Telephone encounter for: 12/25/17. Pt called she says that pharmacy did not receive HYDROcodone-acetaminophen (NORCO/VICODIN) 5-325 MG tablet  cb for pt is [phone]

## 2017-12-25 NOTE — Telephone Encounter (Signed)
[facility] and spoke to TaftKayla asked her if received [employer] Rx from [employer] Worley. Dorathy DaftKayla said yes they did receive it. Told her thank you.

## 2017-12-25 NOTE — Telephone Encounter (Signed)
See note

## 2017-12-29 ENCOUNTER — Encounter: Payer: Self-pay | Admitting: Sports Medicine

## 2017-12-29 ENCOUNTER — Telehealth: Payer: Self-pay | Admitting: Sports Medicine

## 2017-12-29 ENCOUNTER — Ambulatory Visit: Payer: 59 | Admitting: Sports Medicine

## 2017-12-29 VITALS — BP 124/88 | HR 93 | Ht 64.0 in | Wt 226.4 lb

## 2017-12-29 DIAGNOSIS — M79642 Pain in left hand: Secondary | ICD-10-CM

## 2017-12-29 DIAGNOSIS — S63637A Sprain of interphalangeal joint of left little finger, initial encounter: Secondary | ICD-10-CM | POA: Diagnosis not present

## 2017-12-29 DIAGNOSIS — M25531 Pain in right wrist: Secondary | ICD-10-CM

## 2017-12-29 MED ORDER — DICLOFENAC SODIUM 1 % TD GEL: TRANSDERMAL | 1 refills | Status: DC

## 2017-12-29 MED ORDER — PANTOPRAZOLE SODIUM 40 MG PO TBEC: 40.0000 mg | DELAYED_RELEASE_TABLET | Freq: Every day | ORAL | 0 refills | Status: DC

## 2017-12-29 NOTE — Progress Notes (Signed)
[name] [name]. [name] [name] Sports Medicine [facility] at [facility] [phone]  [name] [name] - 32 y.o. female MRN [id number]  Date of birth: 1985/09/06  Visit Date: 12/29/2017  PCP: Helane Rima, DO   Referred by: Helane Rima, DO  Scribe(s) for today's visit: Christoper Fabian, LAT, ATC  SUBJECTIVE:  [name] [name] is here for New Patient (Initial Visit) (R wrist pain) .  Referred by: [name], PA  Her R wrist pain symptoms INITIALLY: Began on 12/20/17 after a FOOSH when she tripped over her cat.  Went to the Urgent Care on 12/21/17. 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  She does report taking Protonix when she takes meloxicam given her prior gastric bypass surgery.   REVIEW OF SYSTEMS: Denies night time disturbances. Denies fevers, chills, or night sweats. Denies unexplained weight loss. Denies personal history of cancer. Denies changes in bowel or bladder habits. Reports recent unreported falls on 12/20/17 which caused current R wrist injury. Denies new or worsening dyspnea or wheezing. Denies headaches or dizziness.  Denies numbness, tingling or weakness  In the extremities.  Denies dizziness or presyncopal episodes Denies lower extremity edema    HISTORY & PERTINENT PRIOR DATA:  Prior History reviewed and updated per electronic medical record.  Significant/pertinent history, findings, studies include:  reports that she has never smoked. She has never used smokeless tobacco. Recent Labs    05/31/17 2118  HGBA1C 4.8   No specialty comments available. No problems  updated.  OBJECTIVE:  VS:  HT:5\' 4"  (162.6 cm)   WT:226 lb 6.4 oz (102.7 kg)  BMI:38.84    BP:124/88  HR:93bpm  TEMP: ( )  RESP:99 %   PHYSICAL EXAM: Constitutional: WDWN, Non-toxic appearing. Psychiatric: Alert & appropriately interactive.  Not depressed or anxious appearing. Respiratory: No increased work of breathing.  Trachea Midline Eyes: Pupils are equal.  EOM intact without nystagmus.  No scleral icterus  Vascular Exam: warm to touch no edema  upper extremity neuro exam: unremarkable normal strength  MSK Exam: Right wrist is overall well aligned.  She has a small amount of pain with palpation directly over the [facility] joint but this is minimal.  Small amount of pain with axial loading circumduction of the joint.  Ligamentously she is stable.  No pain avoid or DRUJ.  Negative Finkelstein test.  Intrinsic thumb and hand strength is intact.  Radial pulses 2+/4.  Left hand is overall well aligned with exception of the left fifth a small amount of swelling over the PIP joint.  There is ligamentous laxity with valgus stressing neutral and 30 degrees.  Small amount of pain over the medial medial aspect of the PIP joint.   ASSESSMENT & PLAN:   1. Right wrist pain   2. Left hand pain   3. Sprain of interphalangeal joint of left little finger, initial encounter     PLAN: Right wrist there is a small sprain of the [facility] joint but this is minimal.  She has good stability.  Small amount of pain with axial loading circumduction.  Try to wean out of the brace as  tolerated.  Topical diclofenac.  Minimize the meloxicam given her history of gastric bypass.  She does have a small amount of swelling of the PIP joint of the left fifth finger and I suspect this is related to a prior valgus injury to this joint with a medial band disruption.  She has good flexion extension and only a small amount of pain with direct palpation.  Further will be deferred at this time and x-rays were previously  reassuring but likely do show early degenerative changes.  The persistent symptoms further diagnostic evaluation can be considered in  Follow-up: Return if symptoms worsen or fail to improve.      Please see additional documentation for Objective, Assessment and Plan sections. Pertinent additional documentation may be included in corresponding procedure notes, imaging studies, problem based documentation and patient instructions. Please see these sections of the encounter for additional information regarding this visit.  CMA/ATC served as Neurosurgeonscribe during this visit. History, Physical, and Plan performed by medical provider. Documentation and orders reviewed and attested to.      [name] MewsMichael D , DO    [facility] Physician

## 2017-12-29 NOTE — Telephone Encounter (Signed)
Copied from CRM [id number]. Topic: Quick Communication - Rx Refill/Question >> Dec 29, 2017  9:44 AM Zada GirtLander, Lumin L wrote: Medication: diclofenac sodium (VOLTAREN) 1 % GEL (please call pharmacy to clarify dosage)  Has the patient contacted their pharmacy? Yes.   (Agent: If no, request that the patient contact the pharmacy for the refill.) (Agent: If yes, when and what did the pharmacy advise?)  Preferred Pharmacy (with phone number or street name): [facility] [place] - [address] [place] [address] Phone: [phone] Fax: [phone]   Agent: Please be advised that RX refills may take up to 3 business days. We ask that you follow-up with your pharmacy.

## 2017-12-29 NOTE — Telephone Encounter (Signed)
This patient saw Berline ChoughRigby.

## 2017-12-29 NOTE — Telephone Encounter (Signed)
Called pharmacy and advised to apply 1 g to R wrist QID. OK per Dr. Berline Choughigby.

## 2017-12-29 NOTE — Telephone Encounter (Signed)
See note

## 2018-01-07 MED FILL — DICLOFENAC SODIUM 1% GEL: 1 | 25 days supply | Qty: 100 | Fill #0

## 2018-01-12 ENCOUNTER — Ambulatory Visit: Payer: 59 | Admitting: Family Medicine

## 2018-01-12 ENCOUNTER — Encounter: Payer: Self-pay | Admitting: Family Medicine

## 2018-01-12 VITALS — BP 126/88 | HR 94 | Temp 98.6°F | Ht 64.0 in | Wt 223.0 lb

## 2018-01-12 DIAGNOSIS — Z9884 Bariatric surgery status: Secondary | ICD-10-CM

## 2018-01-12 DIAGNOSIS — R748 Abnormal levels of other serum enzymes: Secondary | ICD-10-CM

## 2018-01-12 DIAGNOSIS — D509 Iron deficiency anemia, unspecified: Secondary | ICD-10-CM | POA: Diagnosis not present

## 2018-01-12 DIAGNOSIS — L570 Actinic keratosis: Secondary | ICD-10-CM

## 2018-01-12 DIAGNOSIS — F418 Other specified anxiety disorders: Secondary | ICD-10-CM

## 2018-01-12 DIAGNOSIS — Z13220 Encounter for screening for lipoid disorders: Secondary | ICD-10-CM | POA: Diagnosis not present

## 2018-01-12 DIAGNOSIS — F419 Anxiety disorder, unspecified: Secondary | ICD-10-CM | POA: Diagnosis not present

## 2018-01-12 DIAGNOSIS — F5081 Binge eating disorder: Secondary | ICD-10-CM | POA: Diagnosis not present

## 2018-01-12 DIAGNOSIS — F32A Depression, unspecified: Secondary | ICD-10-CM

## 2018-01-12 DIAGNOSIS — F50819 Binge eating disorder, unspecified: Secondary | ICD-10-CM

## 2018-01-12 DIAGNOSIS — E559 Vitamin D deficiency, unspecified: Secondary | ICD-10-CM | POA: Diagnosis not present

## 2018-01-12 DIAGNOSIS — F329 Major depressive disorder, single episode, unspecified: Secondary | ICD-10-CM | POA: Diagnosis not present

## 2018-01-12 LAB — CBC WITH DIFFERENTIAL/PLATELET
Basophils Absolute: 0 10*3/uL (ref 0.0–0.1)
Basophils Relative: 0.8 % (ref 0.0–3.0)
Eosinophils Absolute: 0 10*3/uL (ref 0.0–0.7)
Eosinophils Relative: 0.8 % (ref 0.0–5.0)
HCT: 38.3 % (ref 36.0–46.0)
Hemoglobin: 12.9 g/dL (ref 12.0–15.0)
Lymphocytes Relative: 26.1 % (ref 12.0–46.0)
Lymphs Abs: 1.3 10*3/uL (ref 0.7–4.0)
MCHC: 33.7 g/dL (ref 30.0–36.0)
MCV: 84 fl (ref 78.0–100.0)
Monocytes Absolute: 0.3 10*3/uL (ref 0.1–1.0)
Monocytes Relative: 5.2 % (ref 3.0–12.0)
Neutro Abs: 3.4 10*3/uL (ref 1.4–7.7)
Neutrophils Relative %: 67.1 % (ref 43.0–77.0)
Platelets: 280 10*3/uL (ref 150.0–400.0)
RBC: 4.56 Mil/uL (ref 3.87–5.11)
RDW: 13.9 % (ref 11.5–15.5)
WBC: 5.1 10*3/uL (ref 4.0–10.5)

## 2018-01-12 LAB — HEMOGLOBIN A1C: Hgb A1c MFr Bld: 4.9 % (ref 4.6–6.5)

## 2018-01-12 LAB — COMPREHENSIVE METABOLIC PANEL
ALT: 10 U/L (ref 0–35)
AST: 12 U/L (ref 0–37)
Albumin: 4.1 g/dL (ref 3.5–5.2)
Alkaline Phosphatase: 144 U/L — ABNORMAL HIGH (ref 39–117)
BUN: 10 mg/dL (ref 6–23)
CO2: 29 mEq/L (ref 19–32)
Calcium: 9.2 mg/dL (ref 8.4–10.5)
Chloride: 106 mEq/L (ref 96–112)
Creatinine, Ser: 0.62 mg/dL (ref 0.40–1.20)
GFR: 117.4 mL/min (ref >60.00)
Glucose, Bld: 87 mg/dL (ref 70–99)
Potassium: 3.4 mEq/L — ABNORMAL LOW (ref 3.5–5.1)
Sodium: 141 mEq/L (ref 135–145)
Total Bilirubin: 0.3 mg/dL (ref 0.2–1.2)
Total Protein: 6.8 g/dL (ref 6.0–8.3)

## 2018-01-12 LAB — FOLATE: Folate: 13.6 ng/mL (ref >5.9)

## 2018-01-12 LAB — TSH: TSH: 0.39 u[IU]/mL (ref 0.35–4.50)

## 2018-01-12 LAB — LIPID PANEL
Cholesterol: 180 mg/dL (ref 0–200)
HDL: 65.4 mg/dL (ref >39.00)
LDL Cholesterol: 95 mg/dL (ref 0–99)
NonHDL: 114.66
Total CHOL/HDL Ratio: 3
Triglycerides: 97 mg/dL (ref 0.0–149.0)
VLDL: 19.4 mg/dL (ref 0.0–40.0)

## 2018-01-12 LAB — VITAMIN B12: Vitamin B-12: 146 pg/mL — ABNORMAL LOW (ref 211–911)

## 2018-01-12 LAB — VITAMIN D 25 HYDROXY (VIT D DEFICIENCY, FRACTURES): VITD: 12.43 ng/mL — ABNORMAL LOW (ref 30.00–100.00)

## 2018-01-12 MED ORDER — LISDEXAMFETAMINE DIMESYLATE 60 MG PO CHEW: 1.0000 | CHEWABLE_TABLET | Freq: Every day | ORAL | 0 refills | Status: DC

## 2018-01-12 MED ORDER — ESCITALOPRAM OXALATE 10 MG PO TABS: 10.0000 mg | ORAL_TABLET | Freq: Every day | ORAL | 1 refills | Status: DC

## 2018-01-12 MED FILL — ESCITALOPRAM 10 MG TABLET: 10 | 30 days supply | Qty: 30 | Fill #0

## 2018-01-12 MED FILL — VYVANSE 60 MG CHEW: 60 | 30 days supply | Qty: 30 | Fill #0

## 2018-01-12 NOTE — Progress Notes (Signed)
[name] [name] is a 32 y.o. female is here for follow up.  History of Present Illness:   [name] [name], CMA acting as scribe for Dr. Helane RimaErica Chinelo Benn.   HPI: Patient in office for follow up on medications. Taking Vyvanse daily. Wears off midday. Husband has noticed that she has been more irritable/angry since starting online classes. She is having to do all work at night after kids go to sleep. Eating more at night for reward. Not having trouble going to sleep.   Skin: Spots on right side of forehead. Have been on face "forever" but have started irritating her about eight months ago. Can become painful if she irritates them.   Bariatric surgery in 2015. Due for yearly labs.   There are no preventive care reminders to display for this patient.   [facility] 01/12/2018 06/08/2017 08/13/2015  Decreased Interest 0 3 1  Down, Depressed, Hopeless 0 2 1  PHQ - 2 Score 0 5 2  Altered sleeping 1 3 1   Tired, decreased energy 0 3 1  Change in appetite 1 3 1   Feeling bad or failure about yourself  0 3 1  Trouble concentrating 0 2 1  Moving slowly or fidgety/restless 0 3 0  Suicidal thoughts 0 0 0  PHQ-9 Score 2 22 7   Difficult doing work/chores Somewhat difficult Very difficult Not difficult at all  Some recent data might be hidden   PMHx, SurgHx, SocialHx, FamHx, Medications, and Allergies were reviewed in the Visit Navigator and updated as appropriate.   Patient Active Problem List   Diagnosis Date Noted  . Bleeding diathesis (HCC) 10/13/2017  . Depressive disorder 07/09/2017  . Dysmenorrhea 07/09/2017  . Menorrhagia 07/09/2017  . Reduced libido 07/09/2017  . History of cesarean section 07/09/2017  . Binge eating disorder 04/23/2017  . Morbid obesity (HCC) 04/23/2017  . 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 Pes planus 07/16/2016  . Hallux varus, acquired, right 03/18/2016  . Sleep disturbance 05/28/2015  . Iron deficiency anemia 02/22/2015  . Kidney  stone 08/24/2013  . 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 Anxiety 04/10/2010   Social History   Tobacco Use  . Smoking status: Never Smoker  . Smokeless tobacco: Never Used  Substance Use Topics  . Alcohol use: No    Comment: rare/socially  . Drug use: No   Current Medications and Allergies:   .  diclofenac sodium (VOLTAREN) 1 % GEL, Apply topically to affected area qid, Disp: 100 g, Rfl: 1 .  gabapentin (NEURONTIN) 100 MG capsule, 100mg  in the morning and 200 mg at night, Disp: 90 capsule, Rfl: 2 .  lisdexamfetamine (VYVANSE) 40 MG capsule, Take 1 capsule (40 mg total) by mouth every morning., Disp: 30 capsule, Rfl: 0 .  lisdexamfetamine (VYVANSE) 40 MG capsule, Take 1 capsule (40 mg total) by mouth every morning., Disp: 30 capsule, Rfl: 0 .  lisdexamfetamine (VYVANSE) 40 MG capsule, Take 1 capsule (40 mg total) by mouth every morning., Disp: 30 capsule, Rfl: 0 .  meloxicam (MOBIC) 15 MG tablet, Take 1 tablet (15 mg total) by mouth daily., Disp: 30 tablet, Rfl: 1 .  ondansetron (ZOFRAN ODT) 4 MG disintegrating tablet, Take 1 tablet (4 mg total) by mouth every 8 (eight) hours as needed for nausea or vomiting., Disp: 15 tablet, Rfl: 0 .  pantoprazole (PROTONIX) 40 MG tablet, Take 1 tablet (40 mg total) by mouth daily., Disp: 30 tablet, Rfl: 0  No Known Allergies  Review of Systems   Pertinent items are noted in the HPI. Otherwise, ROS is negative.  Vitals:   Vitals:   01/12/18 0759  BP: 126/88  Pulse: 94  Temp: 98.6 F (37 C)  TempSrc: Oral  SpO2: 97%  Weight: 223 lb (101.2 kg)  Height: 5\' 4"  (1.626 m)     Body mass index is 38.28 kg/m.  Physical Exam:   Physical Exam  Constitutional: She is oriented to person, place, and time. She appears well-developed and well-nourished. No distress.  HENT:  Head: Normocephalic and atraumatic.  Right Ear: External ear normal.  Left Ear: External ear normal.  Nose: Nose normal.  Mouth/Throat: Oropharynx is clear  and moist.  Eyes: Pupils are equal, round, and reactive to light. Conjunctivae and EOM are normal.  Neck: Normal range of motion. Neck supple. No thyromegaly present.  Cardiovascular: Normal rate, regular rhythm, normal heart sounds and intact distal pulses.  Pulmonary/Chest: Effort normal and breath sounds normal.  Abdominal: Soft. Bowel sounds are normal.  Musculoskeletal: Normal range of motion.  Lymphadenopathy:    She has no cervical adenopathy.  Neurological: She is alert and oriented to person, place, and time.  Skin: Skin is warm and dry. Capillary refill takes less than 2 seconds.  Psychiatric: She has a normal mood and affect. Her behavior is normal.  Nursing note and vitals reviewed.  Assessment and Plan:   Chantrice was seen today for follow-up.  Diagnoses and all orders for this visit:  Binge eating disorder -     Lisdexamfetamine Dimesylate (VYVANSE) 60 MG CHEW; Chew 1 tablet by mouth daily. -     Lisdexamfetamine Dimesylate (VYVANSE) 60 MG CHEW; Chew 1 tablet by mouth daily. -     Lisdexamfetamine Dimesylate (VYVANSE) 60 MG CHEW; Chew 1 tablet by mouth daily.  Morbid obesity (HCC) -     TSH -     Vitamin B12  Anxiety  History of Roux-en-Y gastric bypass -     CBC with Differential/Platelet -     Comprehensive metabolic panel -     Hemoglobin A1c -     TSH -     Vitamin B12 -     Vitamin B1 -     Folate  Iron deficiency anemia, unspecified iron deficiency anemia type -     CBC with Differential/Platelet -     Comprehensive metabolic panel -     Iron, TIBC and Ferritin Panel  Depressive disorder  Vitamin D deficiency -     VITAMIN D 25 Hydroxy (Vit-D Deficiency, Fractures)  Screening for lipid disorders -     Lipid panel  Elevated alkaline phosphatase level -     PTH, Intact and Calcium  Situational anxiety -     escitalopram (LEXAPRO) 10 MG tablet; Take 1 tablet (10 mg total) by mouth daily.  AK (actinic keratosis) Comments: x 2 on right face.  Cryo in usual manner with no complications. Aftercare reviewed.    . Reviewed expectations re: course of current medical issues. . Discussed self-management of symptoms. . Outlined signs and symptoms indicating need for more acute intervention. . Patient verbalized understanding and all questions were answered. Marland Kitchen Health Maintenance issues including appropriate healthy diet, exercise, and smoking avoidance were discussed with patient. . See orders for this visit as documented in the electronic medical record. . Patient received an After Visit Summary.  Helane Rima, DO [place] [place] 01/12/2018  Future Appointments  Date Time Provider Department Center  04/20/2018  8:00 AM Helane Rima, DO [facility]   CMA served as scribe during this visit. History, Physical, and Plan performed by medical provider. The above documentation has been reviewed and is accurate and complete. Helane Rima, D.O.

## 2018-01-16 LAB — IRON,TIBC AND FERRITIN PANEL
%SAT: 20 % (calc) (ref 16–45)
Ferritin: 14 ng/mL — ABNORMAL LOW (ref 16–154)
Iron: 70 ug/dL (ref 40–190)
TIBC: 347 mcg/dL (calc) (ref 250–450)

## 2018-01-16 LAB — PTH, INTACT AND CALCIUM
Calcium: 9.2 mg/dL (ref 8.6–10.2)
PTH: 119 pg/mL — ABNORMAL HIGH (ref 14–64)

## 2018-01-16 LAB — VITAMIN B1: Vitamin B1 (Thiamine): <6 nmol/L — ABNORMAL LOW (ref 8–30)

## 2018-01-29 ENCOUNTER — Other Ambulatory Visit: Payer: Self-pay

## 2018-01-29 ENCOUNTER — Encounter: Payer: Self-pay | Admitting: Family Medicine

## 2018-01-29 DIAGNOSIS — M79672 Pain in left foot: Secondary | ICD-10-CM

## 2018-01-29 MED ORDER — GABAPENTIN 100 MG PO CAPS: ORAL_CAPSULE | ORAL | 2 refills | Status: DC

## 2018-02-12 MED FILL — VYVANSE 60 MG CHEW: 60 | 30 days supply | Qty: 30 | Fill #0

## 2018-02-12 MED FILL — GABAPENTIN 100 MG CAPSULE: 100 | 30 days supply | Qty: 90 | Fill #0

## 2018-02-12 MED FILL — ESCITALOPRAM 10 MG TABLET: 10 | 30 days supply | Qty: 30 | Fill #1

## 2018-03-15 ENCOUNTER — Encounter: Payer: Self-pay | Admitting: Family Medicine

## 2018-03-15 ENCOUNTER — Other Ambulatory Visit: Payer: Self-pay

## 2018-03-15 MED ORDER — TRAZODONE HCL 50 MG PO TABS: 25.0000 mg | ORAL_TABLET | Freq: Every evening | ORAL | 0 refills | Status: DC | PRN

## 2018-03-15 MED FILL — traZODone HCL 50 MG TABS: 50 | 30 days supply | Qty: 30 | Fill #0

## 2018-03-18 MED FILL — VYVANSE 60 MG CHEW: 60 | 30 days supply | Qty: 30 | Fill #0

## 2018-03-27 ENCOUNTER — Ambulatory Visit
Admission: EM | Admit: 2018-03-27 | Discharge: 2018-03-27 | Disposition: A | Discharge status: Home / Self Care | Payer: 59 | Attending: Family Medicine | Admitting: Family Medicine

## 2018-03-27 ENCOUNTER — Encounter: Payer: Self-pay | Admitting: Gynecology

## 2018-03-27 ENCOUNTER — Other Ambulatory Visit: Payer: Self-pay

## 2018-03-27 DIAGNOSIS — J111 Influenza due to unidentified influenza virus with other respiratory manifestations: Secondary | ICD-10-CM

## 2018-03-27 DIAGNOSIS — R69 Illness, unspecified: Secondary | ICD-10-CM

## 2018-03-27 LAB — RAPID INFLUENZA A&B ANTIGENS
Influenza A (ARMC): NEGATIVE
Influenza B (ARMC): NEGATIVE

## 2018-03-27 LAB — RAPID STREP SCREEN (MED CTR MEBANE ONLY): STREPTOCOCCUS, GROUP A SCREEN (DIRECT): NEGATIVE

## 2018-03-27 MED ORDER — OSELTAMIVIR PHOSPHATE 75 MG PO CAPS: 75.0000 mg | ORAL_CAPSULE | Freq: Two times a day (BID) | ORAL | 0 refills | Status: DC

## 2018-03-27 MED ORDER — HYDROCOD POLST-CPM POLST ER 10-8 MG/5ML PO SUER: 5.0000 mL | Freq: Every evening | ORAL | 0 refills | Status: DC | PRN

## 2018-03-27 NOTE — ED Provider Notes (Signed)
[facility] ____________________________________________  Time seen: Approximately 12:19 PM  I have reviewed the triage vital signs and the nursing notes.   HISTORY  Chief Complaint No chief complaint on file.  HPI [name] [name] is a 32 y.o. female presenting for evaluation of 2 days of runny nose, nasal congestion, sore throat, cough, chills, body aches and fatigue.  Reports fever onset early this morning, reports 101.2 at 9 AM.  States did take 2 Tylenol prior to arrival.  Reports her boss recently sick with some similar symptoms as well as frequently exposed to sickness at work.  Sore throat has felt like swallowing razor blades.  Continues to overall drink fluids well, eating some.  Denies other aggravating alleviating factors.  Has not yet had a flu vaccine this year.  Denies chest pain, shortness of breath, abdominal pain, dysuria or rash.  Denies recent sickness.  Denies other aggravating or alleviating factors.  Helane Rima, DO: PCP No LMP recorded. Patient has had an ablation.   Past Medical History:  Diagnosis Date  . Anemia   . Anxiety   . DEPRESSION   . GERD (gastroesophageal reflux disease)   . Hypothyroidism    Hx of, normalized TSH during pregnancy 2011  . Infertility, female   . Migraines   . Morbid obesity (HCC)    s/p RY 08/2012 - start weight 290 pounds  . Nephrolithiasis   . PCOS (polycystic ovarian syndrome)   . PONV (postoperative nausea and vomiting)   . Pregnancy induced hypertension     Patient Active Problem List   Diagnosis Date Noted  . Bleeding diathesis (HCC) 10/13/2017  . Depressive disorder 07/09/2017  . Dysmenorrhea 07/09/2017  . Menorrhagia 07/09/2017  . Reduced libido 07/09/2017  . History of cesarean section 07/09/2017  . Binge eating disorder 04/23/2017  . Morbid obesity (HCC) 04/23/2017  . 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 Pes planus 07/16/2016  . Hallux varus, acquired, right 03/18/2016  .  Sleep disturbance 05/28/2015  . Iron deficiency anemia 02/22/2015  . Kidney stone 08/24/2013  . 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 Anxiety 04/10/2010    Past Surgical History:  Procedure Laterality Date  . Quintella Reichert OSTEOTOMY Left 06/24/2017   Procedure: Ralene Bathe;  Surgeon: [name], DPM;  Location: [facility];  Service: Podiatry;  Laterality: Left;  . BUNIONECTOMY Left 06/24/2017   Procedure: Anthoney Harada;  Surgeon: [name], DPM;  Location:  SURGERY CENTER;  Service: Podiatry;  Laterality: Left;  . CESAREAN SECTION     x 2  . FOOT SURGERY    . 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   Procedure: LAPAROSCOPIC ROUX-EN-Y GASTRIC;  Surgeon: Atilano Ina, MD;  Location: [facility];  Service: General;  Laterality: N/A;  laparoscopic roux-en-y gastric bypass  . LITHOTRIPSY Left   . TONSILLECTOMY    . TUBAL LIGATION    . UPPER GI ENDOSCOPY  08/24/2012   Procedure: UPPER GI ENDOSCOPY;  Surgeon: Atilano Ina, MD;  Location: [facility];  Service: General;;  . WISDOM TOOTH EXTRACTION       No current facility-administered medications for this encounter.   Current Outpatient Medications:  .  diclofenac sodium (VOLTAREN) 1 % GEL, Apply topically to affected area qid, Disp: 100 g, Rfl: 1 .  escitalopram (LEXAPRO) 10 MG tablet, Take 1 tablet (10 mg total) by mouth daily., Disp: 30 tablet, Rfl: 1 .  gabapentin (NEURONTIN) 100 MG capsule, 100mg  in the morning and  200 mg at night, Disp: 90 capsule, Rfl: 2 .  Lisdexamfetamine Dimesylate (VYVANSE) 60 MG CHEW, Chew 1 tablet by mouth daily., Disp: 30 tablet, Rfl: 0 .  Lisdexamfetamine Dimesylate (VYVANSE) 60 MG CHEW, Chew 1 tablet by mouth daily., Disp: 30 tablet, Rfl: 0 .  Lisdexamfetamine Dimesylate (VYVANSE) 60 MG CHEW, Chew 1 tablet by mouth daily., Disp: 30 tablet, Rfl: 0 .  meloxicam (MOBIC) 15 MG tablet, Take 1 tablet (15 mg total) by mouth daily., Disp: 30 tablet, Rfl: 1 .   chlorpheniramine-HYDROcodone (TUSSIONEX PENNKINETIC ER) 10-8 MG/5ML SUER, Take 5 mLs by mouth at bedtime as needed for cough. do not drive or operate machinery while taking as can cause drowsiness., Disp: 50 mL, Rfl: 0 .  oseltamivir (TAMIFLU) 75 MG capsule, Take 1 capsule (75 mg total) by mouth every 12 (twelve) hours., Disp: 10 capsule, Rfl: 0  Allergies Patient has no known allergies.  Family History  Problem Relation Age of Onset  . Hyperlipidemia Father   . Hypertension Father   . Kidney disease Father   . Colon cancer Father 25  . Hypertension Mother   . Hypertension Maternal Grandmother   . Uterine cancer Maternal Grandmother 55  . Diabetes Maternal Grandfather     Social History Social History   Tobacco Use  . Smoking status: Never Smoker  . Smokeless tobacco: Never Used  Substance Use Topics  . Alcohol use: No    Comment: rare/socially  . Drug use: No    Review of Systems Constitutional: positive fever ENT: positive sore throat. Cardiovascular: Denies chest pain. Respiratory: Denies shortness of breath. Gastrointestinal: No abdominal pain.   Genitourinary: Negative for dysuria. Musculoskeletal: Negative for back pain. Skin: Negative for rash.   ____________________________________________   PHYSICAL EXAM:  VITAL SIGNS: ED Triage Vitals  Enc Vitals Group     BP 03/27/18 1145 134/90     Pulse Rate 03/27/18 1145 100     Resp 03/27/18 1145 16     Temp 03/27/18 1145 98.1 F (36.7 C)     Temp src --      SpO2 03/27/18 1145 100 %     Weight 03/27/18 1147 220 lb (99.8 kg)     Height 03/27/18 1147 5\' 4"  (1.626 m)     Head Circumference --      Peak Flow --      Pain Score 03/27/18 1147 4     Pain Loc --      Pain Edu? --      Excl. in GC? --     Constitutional: Alert and oriented. Well appearing and in no acute distress. Eyes: Conjunctivae are normal.  Head: Atraumatic. No sinus tenderness to palpation. No swelling. No erythema.  Ears: no erythema,  normal TMs bilaterally.   Nose:Nasal congestion   Mouth/Throat: Mucous membranes are moist. Mild pharyngeal erythema. No tonsillar swelling or exudate.  Neck: No stridor.  No cervical spine tenderness to palpation. Hematological/Lymphatic/Immunilogical: No cervical lymphadenopathy. Cardiovascular: Normal rate, regular rhythm. Grossly normal heart sounds.  Good peripheral circulation. Respiratory: Normal respiratory effort.  No retractions. No wheezes, rales or rhonchi. Good air movement.  Musculoskeletal: Ambulatory with steady gait. No cervical, thoracic or lumbar tenderness to palpation. Neurologic:  Normal speech and language. No gait instability. Skin:  Skin appears warm, dry and intact. No rash noted. Psychiatric: Mood and affect are normal. Speech and behavior are normal.  ___________________________________________   LABS (all labs ordered are listed, but only abnormal results are displayed)  Labs Reviewed  RAPID STREP SCREEN ([facility] ONLY)  RAPID INFLUENZA A&B ANTIGENS ([facility] ONLY)  CULTURE, GROUP A STREP [facility])    RADIOLOGY  No results found. ____________________________________________   PROCEDURES Procedures   INITIAL IMPRESSION / ASSESSMENT AND PLAN / ED COURSE  Pertinent labs & imaging results that were available during my care of the patient were reviewed by me and considered in my medical decision making (see chart for details).  Overall well-appearing patient.  No acute distress.  Strep negative, will culture.  Influenza testing negative.  Suspect viral illness such as influenza.  Discussed use of Tamiflu, patient agrees, Rx given.  PRN Tussionex, continue over-the-counter Tylenol and ibuprofen as needed.  Rest and fluids.Discussed indication, risks and benefits of medications with patient.  Discussed follow up with Primary care physician this week as needed. Discussed follow up and return parameters including no resolution or any worsening concerns.  Patient verbalized understanding and agreed to plan.   ____________________________________________   FINAL CLINICAL IMPRESSION(S) / ED DIAGNOSES  Final diagnoses:  Influenza-like illness     ED Discharge Orders         Ordered    chlorpheniramine-HYDROcodone (TUSSIONEX PENNKINETIC ER) 10-8 MG/5ML SUER  At bedtime PRN     03/27/18 1238    oseltamivir (TAMIFLU) 75 MG capsule  Every 12 hours     03/27/18 1238           Note: This dictation was prepared with Dragon dictation along with smaller phrase technology. Any transcriptional errors that result from this process are unintentional.         Renford Dills, NP 03/27/18 1249

## 2018-03-27 NOTE — Discharge Instructions (Signed)
Take medication as prescribed. Rest. Drink plenty of fluids.  Over the counter Tylenol and ibuprofen as needed. ° °Follow up with your primary care physician this week as needed. Return to Urgent care for new or worsening concerns.  ° °

## 2018-03-27 NOTE — ED Triage Notes (Signed)
Patient c/o x 3 days with body ace / cough/ sore throat/ chills and fever this morning of 101.2.

## 2018-03-30 LAB — CULTURE, GROUP A STREP (THRC)

## 2018-04-19 ENCOUNTER — Emergency Department (HOSPITAL_BASED_OUTPATIENT_CLINIC_OR_DEPARTMENT_OTHER): Payer: 59

## 2018-04-19 ENCOUNTER — Encounter (HOSPITAL_BASED_OUTPATIENT_CLINIC_OR_DEPARTMENT_OTHER): Payer: Self-pay | Admitting: *Deleted

## 2018-04-19 ENCOUNTER — Emergency Department (HOSPITAL_BASED_OUTPATIENT_CLINIC_OR_DEPARTMENT_OTHER)
Admission: EM | Admit: 2018-04-19 | Discharge: 2018-04-19 | Disposition: A | Discharge status: Home / Self Care | Payer: 59 | Attending: Emergency Medicine | Admitting: Emergency Medicine

## 2018-04-19 ENCOUNTER — Other Ambulatory Visit: Payer: Self-pay

## 2018-04-19 DIAGNOSIS — N1330 Unspecified hydronephrosis: Secondary | ICD-10-CM | POA: Diagnosis not present

## 2018-04-19 DIAGNOSIS — R1032 Left lower quadrant pain: Secondary | ICD-10-CM | POA: Diagnosis present

## 2018-04-19 DIAGNOSIS — Z79899 Other long term (current) drug therapy: Secondary | ICD-10-CM | POA: Diagnosis not present

## 2018-04-19 DIAGNOSIS — R109 Unspecified abdominal pain: Secondary | ICD-10-CM | POA: Diagnosis not present

## 2018-04-19 DIAGNOSIS — Z9884 Bariatric surgery status: Secondary | ICD-10-CM | POA: Insufficient documentation

## 2018-04-19 DIAGNOSIS — E039 Hypothyroidism, unspecified: Secondary | ICD-10-CM | POA: Diagnosis not present

## 2018-04-19 DIAGNOSIS — F329 Major depressive disorder, single episode, unspecified: Secondary | ICD-10-CM | POA: Diagnosis not present

## 2018-04-19 DIAGNOSIS — F419 Anxiety disorder, unspecified: Secondary | ICD-10-CM | POA: Diagnosis not present

## 2018-04-19 LAB — CBC WITH DIFFERENTIAL/PLATELET
ABS IMMATURE GRANULOCYTES: 0.02 10*3/uL (ref 0.00–0.07)
BASOS ABS: 0 10*3/uL (ref 0.0–0.1)
BASOS PCT: 0 %
Eosinophils Absolute: 0.1 10*3/uL (ref 0.0–0.5)
Eosinophils Relative: 1 %
HCT: 36.6 % (ref 36.0–46.0)
Hemoglobin: 11.3 g/dL — ABNORMAL LOW (ref 12.0–15.0)
IMMATURE GRANULOCYTES: 0 %
Lymphocytes Relative: 31 %
Lymphs Abs: 2.8 10*3/uL (ref 0.7–4.0)
MCH: 26.1 pg (ref 26.0–34.0)
MCHC: 30.9 g/dL (ref 30.0–36.0)
MCV: 84.5 fL (ref 80.0–100.0)
Monocytes Absolute: 0.6 10*3/uL (ref 0.1–1.0)
Monocytes Relative: 7 %
NEUTROS ABS: 5.5 10*3/uL (ref 1.7–7.7)
NEUTROS PCT: 61 %
NRBC: 0 % (ref 0.0–0.2)
PLATELETS: 331 10*3/uL (ref 150–400)
RBC: 4.33 MIL/uL (ref 3.87–5.11)
RDW: 13.9 % (ref 11.5–15.5)
WBC: 8.9 10*3/uL (ref 4.0–10.5)

## 2018-04-19 LAB — URINALYSIS, ROUTINE W REFLEX MICROSCOPIC
BILIRUBIN URINE: NEGATIVE
Glucose, UA: NEGATIVE mg/dL
KETONES UR: NEGATIVE mg/dL
Leukocytes, UA: NEGATIVE
NITRITE: NEGATIVE
PH: 6 (ref 5.0–8.0)
Protein, ur: NEGATIVE mg/dL
Specific Gravity, Urine: 1.01 (ref 1.005–1.030)

## 2018-04-19 LAB — PREGNANCY, URINE: Preg Test, Ur: NEGATIVE

## 2018-04-19 LAB — COMPREHENSIVE METABOLIC PANEL
ALT: 13 U/L (ref 0–44)
AST: 20 U/L (ref 15–41)
Albumin: 4 g/dL (ref 3.5–5.0)
Alkaline Phosphatase: 162 U/L — ABNORMAL HIGH (ref 38–126)
Anion gap: 7 (ref 5–15)
BUN: 12 mg/dL (ref 6–20)
CHLORIDE: 103 mmol/L (ref 98–111)
CO2: 29 mmol/L (ref 22–32)
CREATININE: 0.86 mg/dL (ref 0.44–1.00)
Calcium: 9 mg/dL (ref 8.9–10.3)
GFR calc Af Amer: >60 mL/min (ref >60)
GFR calc non Af Amer: >60 mL/min (ref >60)
Glucose, Bld: 84 mg/dL (ref 70–99)
Potassium: 3.8 mmol/L (ref 3.5–5.1)
SODIUM: 139 mmol/L (ref 135–145)
Total Bilirubin: 0.2 mg/dL — ABNORMAL LOW (ref 0.3–1.2)
Total Protein: 6.9 g/dL (ref 6.5–8.1)

## 2018-04-19 LAB — LIPASE, BLOOD: Lipase: 32 U/L (ref 11–51)

## 2018-04-19 LAB — URINALYSIS, MICROSCOPIC (REFLEX)

## 2018-04-19 IMAGING — CT CT RENAL STONE PROTOCOL
2 of 4 series · 16 of 46 positions shown, 18 images · non-contrast
Comparison: Prior CT from [DATE]

CLINICAL DATA: Initial evaluation for acute left flank pain for 4
days. History of kidney stones.

EXAM:
CT ABDOMEN AND PELVIS WITHOUT CONTRAST
TECHNIQUE: Multidetector CT imaging of the abdomen and pelvis was performed
following the standard protocol without IV contrast.

[Series 2: axial st · axial · 0.88mm/px · z∈[-538,-93]mm · 13 of 99 slices shown, 15 images]
[im 5/99  soft-tissue]
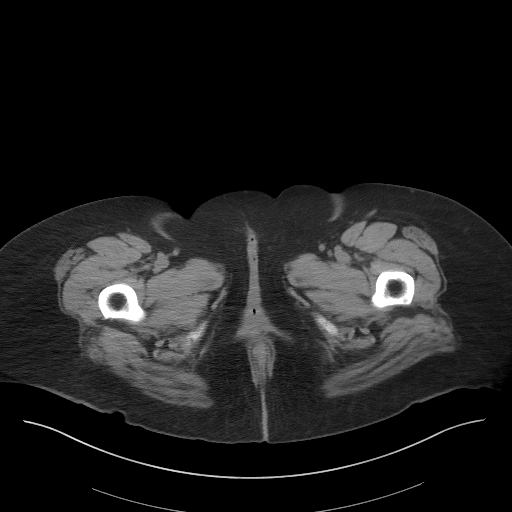
[im 5/99  bone]
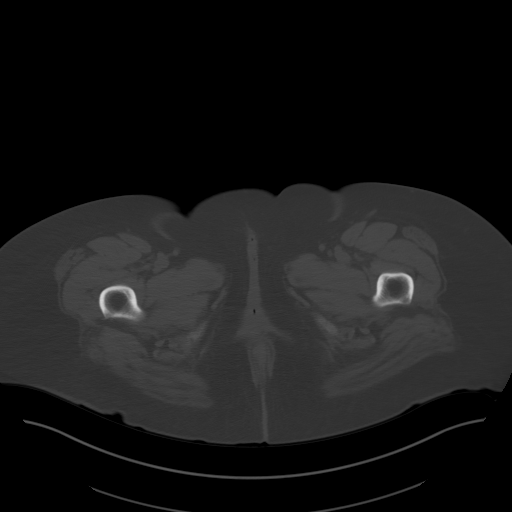
[im 13/99  soft-tissue]
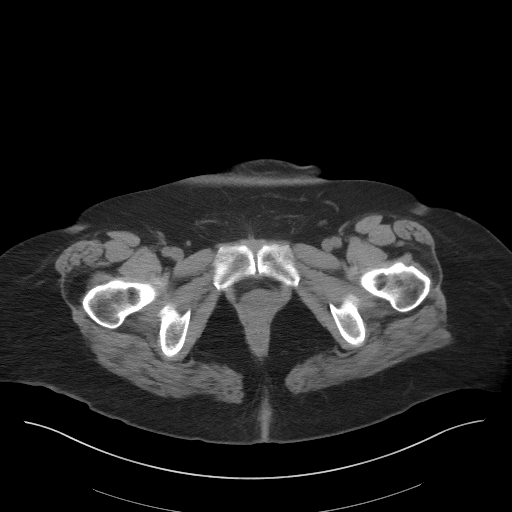
[im 21/99  soft-tissue]
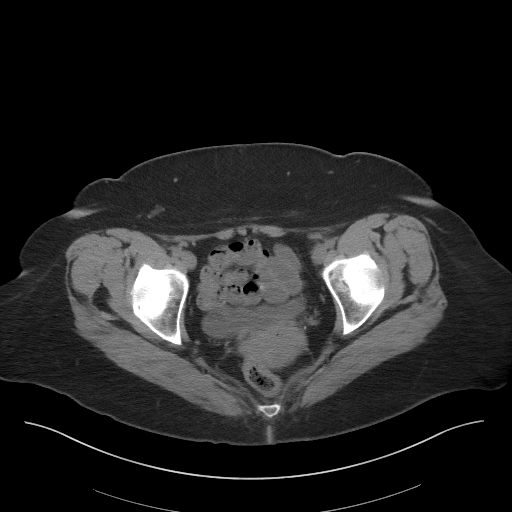
[im 29/99  soft-tissue]
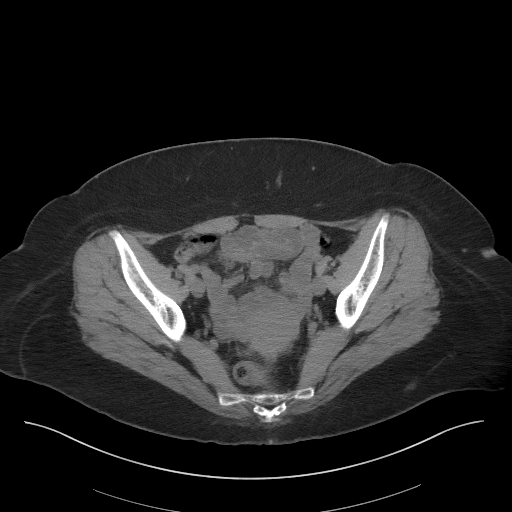
[im 33/99  soft-tissue]
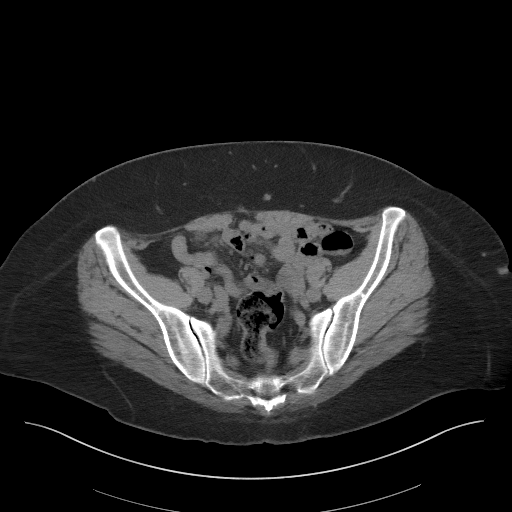
[im 41/99  soft-tissue]
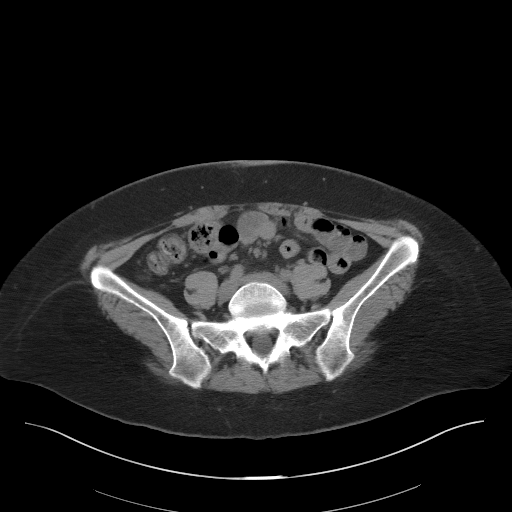
[im 50/99  soft-tissue]
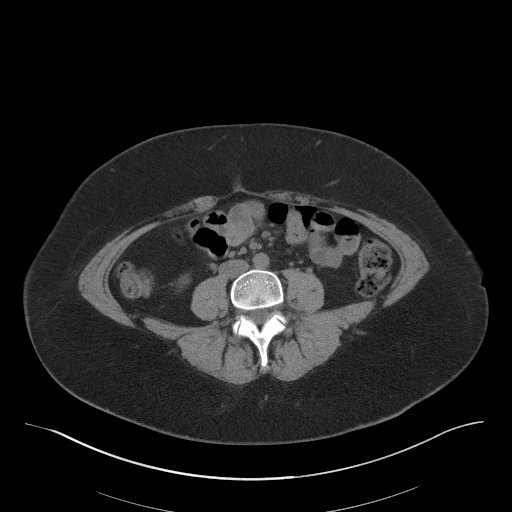
[im 58/99  soft-tissue]
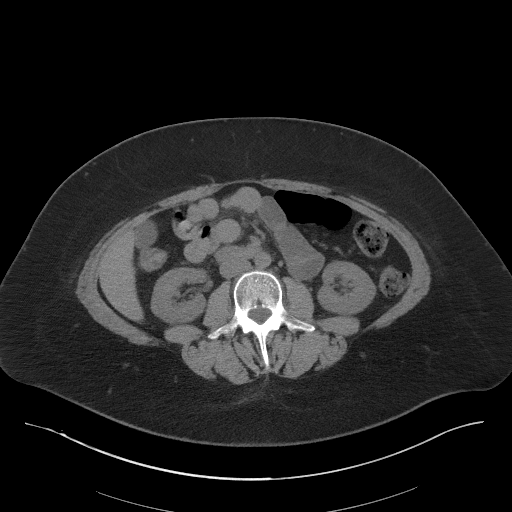
[im 66/99  soft-tissue]
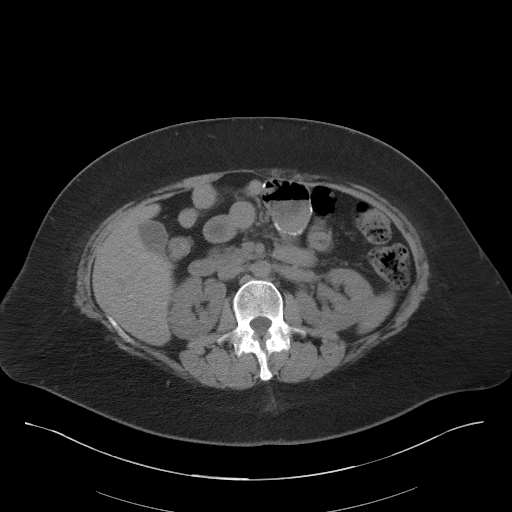
[im 66/99  bone]
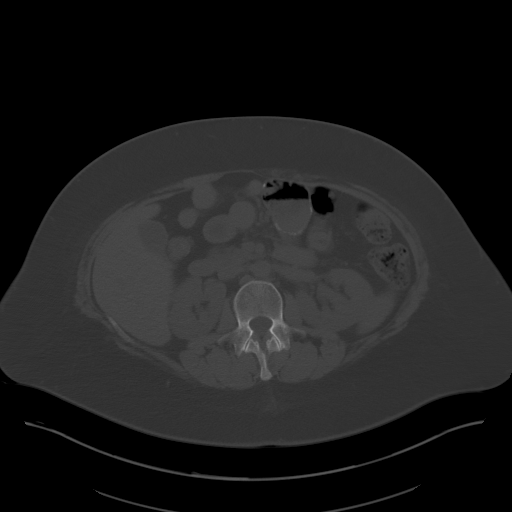
[im 70/99  soft-tissue]
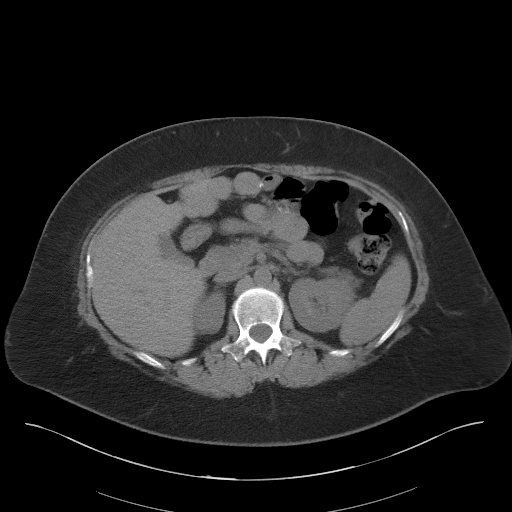
[im 78/99  soft-tissue]
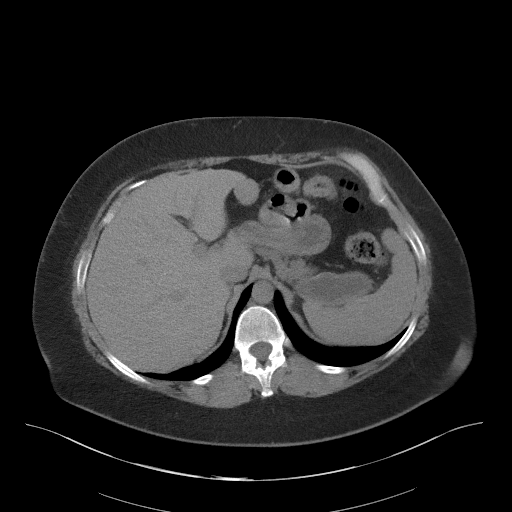
[im 86/99  soft-tissue]
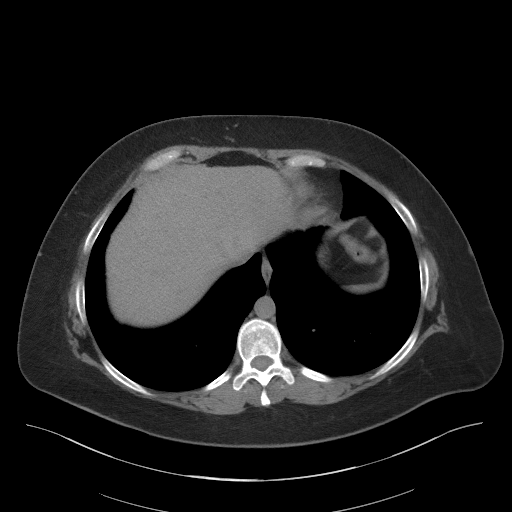
[im 94/99  soft-tissue]
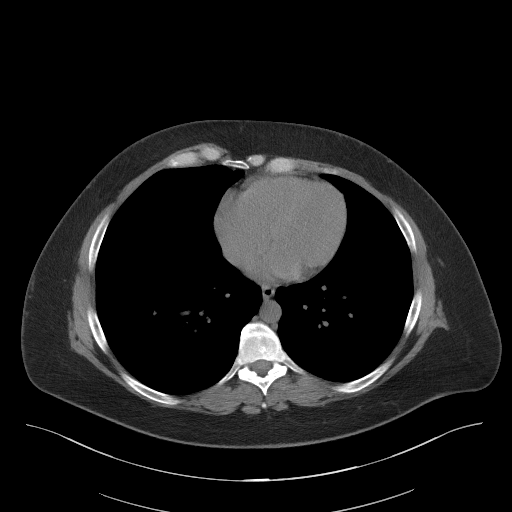

[Series 5: coronal st · coronal · 0.82mm/px · 3 of 89 slices shown]
[im 30/89  soft-tissue]
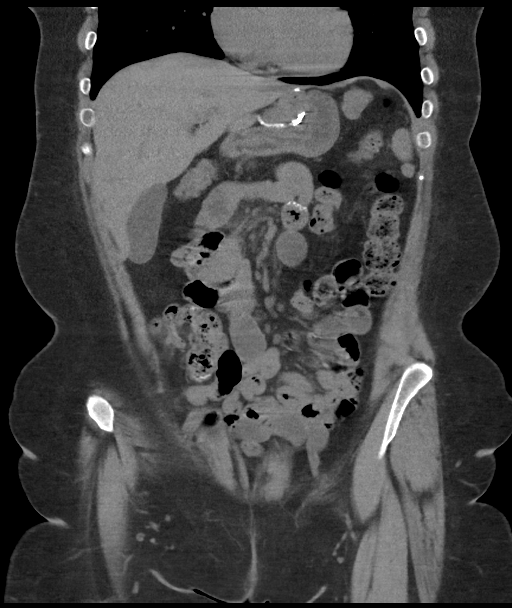
[im 40/89  soft-tissue]
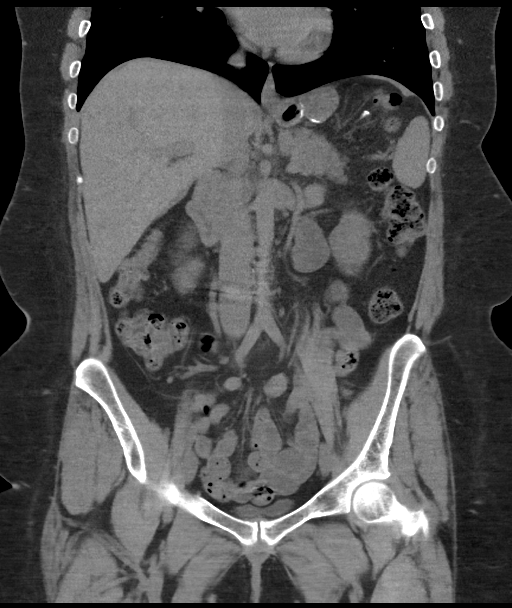
[im 49/89  soft-tissue]
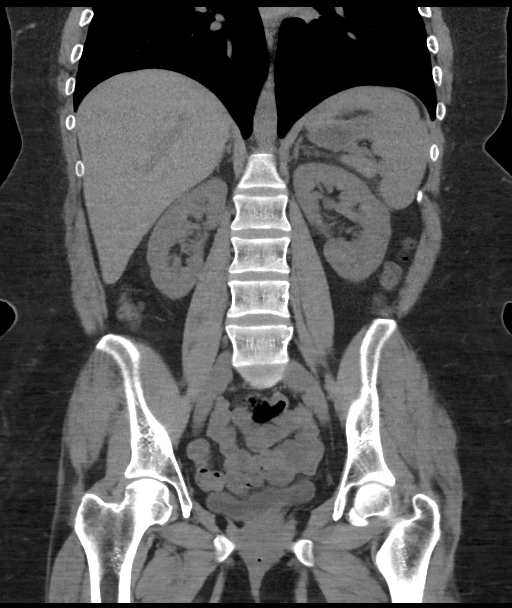

[16 of 46 positions shown; findings below may reference images not displayed]

FINDINGS: Lower chest: Visualized lung bases are clear.

Hepatobiliary: Limited noncontrast evaluation of the liver is
unremarkable. Gallbladder within normal limits. No biliary
dilatation.

Pancreas: Pancreas within normal limits.

Spleen: Spleen within normal limits.

Adrenals/Urinary Tract: Adrenal glands are normal. Kidneys are equal
in size. There is mild left-sided hydronephrosis and fullness of the
left renal collecting system as compared to the right. No definite
obstructive stones identified along the course of the left renal
collecting system. Finding raises the possibility for a recently
passed stone. Possible infection could also be considered. Right
kidney within normal limits without hydronephrosis or
nephrolithiasis no right-sided hydroureter.

Bladder largely decompressed without acute abnormality. No
discernible stones seen layering within the bladder lumen.

Stomach/Bowel: Sequelae of prior gastric bypass noted. Stomach
otherwise unremarkable. No evidence for bowel obstruction. No acute
inflammatory changes seen about the bowels.

Vascular/Lymphatic: Intra-abdominal aorta of normal caliber. No
adenopathy.

Reproductive: Uterus and ovaries within normal limits.

Other: No free air or fluid.

Musculoskeletal: No acute osseus abnormality. No worrisome lytic or
blastic osseous lesions.
IMPRESSION: 1. Mild left-sided hydronephrosis with no discernible obstructive
radiopaque stone identified. Finding raises the possibility for a
recently passed stone. Possible infection could also be considered.
Correlation with urinalysis recommended.
2. No other radiopaque calculi identified within either kidney or
along the course of either renal collecting system.
3. No other acute intra-abdominal or pelvic process.
4. Prior gastric bypass.

## 2018-04-19 MED ORDER — MORPHINE SULFATE (PF) 4 MG/ML IV SOLN
6.0000 mg | Freq: Once | INTRAVENOUS | Status: AC
  Administered 2018-04-19: 6 mg via INTRAVENOUS
  Filled 2018-04-19: qty 2

## 2018-04-19 MED ORDER — OXYCODONE HCL 5 MG PO TABS: 5.0000 mg | ORAL_TABLET | Freq: Four times a day (QID) | ORAL | 0 refills | Status: AC | PRN

## 2018-04-19 MED ORDER — KETOROLAC TROMETHAMINE 30 MG/ML IJ SOLN
30.0000 mg | Freq: Once | INTRAMUSCULAR | Status: AC
  Administered 2018-04-19: 30 mg via INTRAVENOUS
  Filled 2018-04-19: qty 1

## 2018-04-19 MED ORDER — HYDROMORPHONE HCL 1 MG/ML IJ SOLN
1.0000 mg | Freq: Once | INTRAMUSCULAR | Status: AC
  Administered 2018-04-19: 1 mg via INTRAVENOUS
  Filled 2018-04-19: qty 1

## 2018-04-19 MED ORDER — SODIUM CHLORIDE 0.9 % IV BOLUS
1000.0000 mL | Freq: Once | INTRAVENOUS | Status: AC
  Administered 2018-04-19: 1000 mL via INTRAVENOUS

## 2018-04-19 MED ORDER — ONDANSETRON HCL 4 MG/2ML IJ SOLN
4.0000 mg | Freq: Once | INTRAMUSCULAR | Status: AC
  Administered 2018-04-19: 4 mg via INTRAVENOUS
  Filled 2018-04-19: qty 2

## 2018-04-19 MED ORDER — ONDANSETRON 4 MG PO TBDP: 4.0000 mg | ORAL_TABLET | Freq: Three times a day (TID) | ORAL | 0 refills | Status: DC | PRN

## 2018-04-19 NOTE — ED Notes (Signed)
Family at bedside. 

## 2018-04-19 NOTE — ED Notes (Signed)
Patient transported to CT 

## 2018-04-19 NOTE — ED Notes (Signed)
ED Provider at bedside. 

## 2018-04-19 NOTE — ED Triage Notes (Signed)
Left flank pain x 4 days.  

## 2018-04-19 NOTE — ED Notes (Signed)
Pt teaching provided on medications that may cause drowsiness. Pt instructed not to drive or operate heavy machinery while taking the prescribed medication. Pt verbalized understanding.   

## 2018-04-19 NOTE — Discharge Instructions (Addendum)
You were seen in the ER for left-sided flank pain.  CT shows changes most likely from a recently passed stone.  There is some swelling to your left kidney that is likely from inflammation.  Your urine did not look infected and you did not have any urinary tract infection symptoms or fever.  I suspect you passed a stone and now we have to control your pain.  Take 500 to 1000 mg of acetaminophen every 6 hours for mild to moderate pain.  For breakthrough or more severe pain you can take oxycodone 5 mg every 6 hours.  Zofran for nausea.  Stay well-hydrated.  Return to the ER for worsening pain, fevers, chills, burning with urination, blood in your urine, inability to do void, groin numbness, numbness or weakness your extremities.

## 2018-04-19 NOTE — ED Provider Notes (Signed)
[facility] EMERGENCY DEPARTMENT Provider Note   CSN: [id number] Arrival date & time: 04/19/18  1919     History   Chief Complaint Chief Complaint  Patient presents with  . Back Pain    HPI [name] [name] is a 32 y.o. female with history of kidney stones, GERD, obesity status post bariatric surgery in 2015, is here for evaluation of left flank pain.  Onset on Thursday but progressively worsening, 10/10 today.  Described as a pressure, sharp and stabbing.  Associated with diffuse abdominal pressure most significantly at lower abdomen and left mid abdomen.  She is also having nausea and vomiting, dysuria and malodorous urine.  She tried Tylenol and ibuprofen without relief.  She has a history of bilateral tubal ligation and ablation, gets occasional vaginal spotting, last was 2 weeks ago.  She has required lithotripsy in the past 2012 for renal stones.  She denies associated fevers, chills, hematuria, diarrhea, constipation, hematemesis, changes to vaginal discharge.  No recent falls. HPI  Past Medical History:  Diagnosis Date  . Anemia   . Anxiety   . DEPRESSION   . GERD (gastroesophageal reflux disease)   . Hypothyroidism    Hx of, normalized TSH during pregnancy 2011  . Infertility, female   . Migraines   . Morbid obesity (HCC)    s/p RY 08/2012 - start weight 290 pounds  . Nephrolithiasis   . PCOS (polycystic ovarian syndrome)   . PONV (postoperative nausea and vomiting)   . Pregnancy induced hypertension     Patient Active Problem List   Diagnosis Date Noted  . Bleeding diathesis (HCC) 10/13/2017  . Depressive disorder 07/09/2017  . Dysmenorrhea 07/09/2017  . Menorrhagia 07/09/2017  . Reduced libido 07/09/2017  . History of cesarean section 07/09/2017  . Binge eating disorder 04/23/2017  . Morbid obesity (HCC) 04/23/2017  . 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 Pes planus 07/16/2016  . Hallux varus, acquired, right 03/18/2016  . Sleep  disturbance 05/28/2015  . Iron deficiency anemia 02/22/2015  . Kidney stone 08/24/2013  . 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 Anxiety 04/10/2010    Past Surgical History:  Procedure Laterality Date  . Quintella Reichert OSTEOTOMY Left 06/24/2017   Procedure: Ralene Bathe;  Surgeon: [name], DPM;  Location: [facility];  Service: Podiatry;  Laterality: Left;  . BUNIONECTOMY Left 06/24/2017   Procedure: Anthoney Harada;  Surgeon: [name], DPM;  Location: [facility];  Service: Podiatry;  Laterality: Left;  . CESAREAN SECTION     x 2  . FOOT SURGERY    . 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   Procedure: LAPAROSCOPIC ROUX-EN-Y GASTRIC;  Surgeon: Atilano Ina, MD;  Location: [facility];  Service: General;  Laterality: N/A;  laparoscopic roux-en-y gastric bypass  . LITHOTRIPSY Left   . TONSILLECTOMY    . TUBAL LIGATION    . UPPER GI ENDOSCOPY  08/24/2012   Procedure: UPPER GI ENDOSCOPY;  Surgeon: Atilano Ina, MD;  Location: [facility];  Service: General;;  . WISDOM TOOTH EXTRACTION       OB History    Gravida  2   Para  2   Term  2   Preterm  0   AB  0   Living  2     SAB  0   TAB  0   Ectopic  0   Multiple  0   Live Births  2  Home Medications    Prior to Admission medications   Medication Sig Start Date End Date Taking? Authorizing Provider  chlorpheniramine-HYDROcodone (TUSSIONEX PENNKINETIC ER) 10-8 MG/5ML SUER Take 5 mLs by mouth at bedtime as needed for cough. do not drive or operate machinery while taking as can cause drowsiness. 03/27/18   Renford Dills, NP  diclofenac sodium (VOLTAREN) 1 % GEL Apply topically to affected area qid 12/29/17   [name], DO  escitalopram (LEXAPRO) 10 MG tablet Take 1 tablet (10 mg total) by mouth daily. 01/12/18   Helane Rima, DO  gabapentin (NEURONTIN) 100 MG capsule 100mg  in the morning and 200 mg at night 01/29/18   Helane Rima, DO  Lisdexamfetamine  Dimesylate (VYVANSE) 60 MG CHEW Chew 1 tablet by mouth daily. 01/12/18 03/27/18  Helane Rima, DO  Lisdexamfetamine Dimesylate (VYVANSE) 60 MG CHEW Chew 1 tablet by mouth daily. 02/11/18 03/27/18  Helane Rima, DO  Lisdexamfetamine Dimesylate (VYVANSE) 60 MG CHEW Chew 1 tablet by mouth daily. 03/12/18 04/10/18  Helane Rima, DO  meloxicam (MOBIC) 15 MG tablet Take 1 tablet (15 mg total) by mouth daily. 10/13/17   Helane Rima, DO  oseltamivir (TAMIFLU) 75 MG capsule Take 1 capsule (75 mg total) by mouth every 12 (twelve) hours. 03/27/18   Renford Dills, NP    Family History Family History  Problem Relation Age of Onset  . Hyperlipidemia Father   . Hypertension Father   . Kidney disease Father   . Colon cancer Father 42  . Hypertension Mother   . Hypertension Maternal Grandmother   . Uterine cancer Maternal Grandmother 30  . Diabetes Maternal Grandfather     Social History Social History   Tobacco Use  . Smoking status: Never Smoker  . Smokeless tobacco: Never Used  Substance Use Topics  . Alcohol use: No    Comment: rare/socially  . Drug use: No     Allergies   Patient has no known allergies.   Review of Systems Review of Systems  Gastrointestinal: Positive for nausea and vomiting.  Genitourinary: Positive for dysuria and flank pain.  All other systems reviewed and are negative.    Physical Exam Updated Vital Signs BP (!) 143/84 (BP Location: Left Arm)   Pulse 100   Temp 98 F (36.7 C) (Oral)   Resp 20   Ht 5\' 4"  (1.626 m)   Wt 99.8 kg   SpO2 100%   BMI 37.76 kg/m   Physical Exam  Constitutional: She is oriented to person, place, and time. She appears well-developed and well-nourished.  Looks uncomfortable but nontoxic.  HENT:  Head: Normocephalic and atraumatic.  Nose: Nose normal.  Eyes: Pupils are equal, round, and reactive to light. Conjunctivae and EOM are normal.  Neck: Normal range of motion.  Cardiovascular: Normal rate and regular  rhythm.  Pulmonary/Chest: Effort normal and breath sounds normal.  Abdominal: Soft. Bowel sounds are normal. There is tenderness.  Left CVA tenderness.  Mild left midabdominal, LLQ tenderness.  No guarding, rigidity.  Negative Murphy's, McBurney's, Rovsing's.  Active bowel sounds to lower abdomen.  Musculoskeletal: Normal range of motion.  Neurological: She is alert and oriented to person, place, and time.  Skin: Skin is warm and dry. Capillary refill takes less than 2 seconds.  Psychiatric: She has a normal mood and affect. Her behavior is normal.  Nursing note and vitals reviewed.    ED Treatments / Results  Labs (all labs ordered are listed, but only abnormal results are displayed) Labs Reviewed  URINALYSIS, ROUTINE W REFLEX MICROSCOPIC - Abnormal; Notable for the following components:      Result Value   Hgb urine dipstick TRACE (*)    All other components within normal limits  URINALYSIS, MICROSCOPIC (REFLEX) - Abnormal; Notable for the following components:   Bacteria, UA RARE (*)    All other components within normal limits  PREGNANCY, URINE    EKG None  Radiology No results found.  Procedures Procedures (including critical care time)  Medications Ordered in ED Medications - No data to display   Initial Impression / Assessment and Plan / ED Course  I have reviewed the triage vital signs and the nursing notes.  Pertinent labs & imaging results that were available during my care of the patient were reviewed by me and considered in my medical decision making (see chart for details).     Concern for renal etiology like UTI, pyelonephritis, stone.  No fevers, tachycardia, other systemic symptoms.  Will obtain screening labs, creatinine, CT renal study.  2218: Urinalysis with trace hemoglobin, 0-5 RBCs, rare bacteria 0-5 WBC.  She does not have urinary tract infection symptoms.  Negative nitrites and leukocytes.  CT renal shows mild left-sided hydronephrosis favoring  it recently passed stone, less likely infectious etiology.  Symptoms were adequately controlled in the ER.  She is tolerating fluids.  She is voiding.  Discussed results with patient.  Will discharge with symptomatic management.  I doubt diverticulitis, cauda equina in this clinical picture.  Return precautions discussed.  Patient is in agreement.  Final Clinical Impressions(s) / ED Diagnoses   Final diagnoses:  None    ED Discharge Orders    None       Liberty Handy, PA-C 04/19/18 2220    Virgina Norfolk, DO 04/20/18 0019

## 2018-04-20 ENCOUNTER — Ambulatory Visit: Payer: 59 | Admitting: Family Medicine

## 2018-04-20 ENCOUNTER — Encounter: Payer: Self-pay | Admitting: Family Medicine

## 2018-04-20 VITALS — BP 114/68 | HR 77 | Temp 98.3°F | Ht 64.0 in | Wt 225.4 lb

## 2018-04-20 DIAGNOSIS — N200 Calculus of kidney: Secondary | ICD-10-CM

## 2018-04-20 DIAGNOSIS — F5081 Binge eating disorder: Secondary | ICD-10-CM

## 2018-04-20 DIAGNOSIS — Z9884 Bariatric surgery status: Secondary | ICD-10-CM | POA: Diagnosis not present

## 2018-04-20 DIAGNOSIS — F4321 Adjustment disorder with depressed mood: Secondary | ICD-10-CM

## 2018-04-20 DIAGNOSIS — Z0000 Encounter for general adult medical examination without abnormal findings: Secondary | ICD-10-CM

## 2018-04-20 MED ORDER — LORAZEPAM 1 MG PO TABS: 1.0000 mg | ORAL_TABLET | Freq: Two times a day (BID) | ORAL | 1 refills | Status: DC | PRN

## 2018-04-20 MED ORDER — LISDEXAMFETAMINE DIMESYLATE 60 MG PO CHEW: 1.0000 | CHEWABLE_TABLET | Freq: Every day | ORAL | 0 refills | Status: DC

## 2018-04-20 MED ORDER — TAMSULOSIN HCL 0.4 MG PO CAPS: 0.4000 mg | ORAL_CAPSULE | Freq: Every day | ORAL | 3 refills | Status: DC

## 2018-04-20 MED ORDER — ESCITALOPRAM OXALATE 20 MG PO TABS: 20.0000 mg | ORAL_TABLET | Freq: Every day | ORAL | 2 refills | Status: DC

## 2018-04-20 MED FILL — TAMSULOSIN HCL 0.4 MG CAP: 0.4 | 10 days supply | Qty: 10 | Fill #0

## 2018-04-20 MED FILL — VYVANSE 60 MG CHEW: 60 | 30 days supply | Qty: 30 | Fill #0

## 2018-04-20 MED FILL — LORazepam 1 MG TABS: 1 | 10 days supply | Qty: 20 | Fill #0

## 2018-04-20 MED FILL — ESCITALOPRAM 20 MG TABLET: 20 | 90 days supply | Qty: 90 | Fill #0

## 2018-04-20 NOTE — Assessment & Plan Note (Signed)
The patient is asked to make an attempt to improve diet and exercise patterns to aid in medical management of this problem.  

## 2018-04-20 NOTE — Assessment & Plan Note (Signed)
Several vitamin/mineral deficiencies 01/12/18 labs. Advised patient to take Bariatric MVM. She is currently taking a Woman's MWM and B12. Will check labs again at next visit.

## 2018-04-20 NOTE — Progress Notes (Signed)
Subjective:    [name] [name] is a 32 y.o. female and is here for a comprehensive physical exam.  ED visit yesterday reviewed: Urinalysis with trace hemoglobin, 0-5 RBCs, rare bacteria 0-5 WBC.  She does not have urinary tract infection symptoms.  Negative nitrites and leukocytes.  CT renal shows mild left-sided hydronephrosis favoring it recently passed stone, less likely infectious etiology.  Symptoms were adequately controlled in the ER.  She is tolerating fluids.  She is voiding.  Discussed results with patient.  Will discharge with symptomatic management.  I doubt diverticulitis, cauda equina in this clinical picture.  Return precautions discussed.  Patient is in agreement.  Current Outpatient Medications:  .  diclofenac sodium (VOLTAREN) 1 % GEL, Apply topically to affected area qid, Disp: 100 g, Rfl: 1 .  escitalopram (LEXAPRO) 10 MG tablet, Take 1 tablet (10 mg total) by mouth daily., Disp: 30 tablet, Rfl: 1 .  gabapentin (NEURONTIN) 100 MG capsule, 100mg  in the morning and 200 mg at night, Disp: 90 capsule, Rfl: 2 .  Lisdexamfetamine Dimesylate (VYVANSE) 60 MG CHEW, Chew 1 tablet by mouth daily., Disp: 30 tablet, Rfl: 0 .  meloxicam (MOBIC) 15 MG tablet, Take 1 tablet (15 mg total) by mouth daily., Disp: 30 tablet, Rfl: 1 .  oxyCODONE (OXY IR/ROXICODONE) 5 MG immediate release tablet, Take 1 tablet (5 mg total) by mouth every 6 (six) hours as needed for up to 2 days for severe pain or breakthrough pain., Disp: 8 tablet, Rfl: 0  There are no preventive care reminders to display for this patient.  PMHx, SurgHx, SocialHx, Medications, and Allergies were reviewed in the Visit Navigator and updated as appropriate.   Past Medical History:  Diagnosis Date  . Anemia   . Anxiety   . DEPRESSION   . GERD (gastroesophageal reflux disease)   . Hypothyroidism    Hx of, normalized TSH during pregnancy 2011  . Infertility, female   . Migraines   . Morbid obesity (HCC)    s/p RY 08/2012 -  start weight 290 pounds  . Nephrolithiasis   . PCOS (polycystic ovarian syndrome)   . PONV (postoperative nausea and vomiting)   . Pregnancy induced hypertension      Past Surgical History:  Procedure Laterality Date  . Quintella Reichert OSTEOTOMY Left 06/24/2017   Procedure: Ralene Bathe;  Surgeon: [name], DPM;  Location: [facility];  Service: Podiatry;  Laterality: Left;  . BUNIONECTOMY Left 06/24/2017   Procedure: Anthoney Harada;  Surgeon: [name], DPM;  Location: [facility];  Service: Podiatry;  Laterality: Left;  . CESAREAN SECTION     x 2  . FOOT SURGERY    . 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   Procedure: LAPAROSCOPIC ROUX-EN-Y GASTRIC;  Surgeon: Atilano Ina, MD;  Location: [facility];  Service: General;  Laterality: N/A;  laparoscopic roux-en-y gastric bypass  . LITHOTRIPSY Left   . TONSILLECTOMY    . TUBAL LIGATION    . UPPER GI ENDOSCOPY  08/24/2012   Procedure: UPPER GI ENDOSCOPY;  Surgeon: Atilano Ina, MD;  Location: [facility];  Service: General;;  . WISDOM TOOTH EXTRACTION       Family History  Problem Relation Age of Onset  . Hyperlipidemia Father   . Hypertension Father   . Kidney disease Father   . Colon cancer Father 44  . Hypertension Mother   . Hypertension Maternal Grandmother   . Uterine cancer Maternal Grandmother 53  . Diabetes  Maternal Grandfather     Social History   Tobacco Use  . Smoking status: Never Smoker  . Smokeless tobacco: Never Used  Substance Use Topics  . Alcohol use: No    Comment: rare/socially  . Drug use: No    Review of Systems:   Pertinent items are noted in the HPI. Otherwise, ROS is negative.  Objective:   BP 114/68   Pulse 77   Temp 98.3 F (36.8 C) (Oral)   Ht 5\' 4"  (1.626 m)   Wt 225 lb 6.4 oz (102.2 kg)   SpO2 98%   BMI 38.69 kg/m    General appearance: alert, cooperative and appears stated age. Head: normocephalic, without obvious abnormality, atraumatic. Neck:  no adenopathy, supple, symmetrical, trachea midline; thyroid not enlarged, symmetric, no tenderness/mass/nodules. Lungs: clear to auscultation bilaterally. Heart: regular rate and rhythm Abdomen: soft, non-tender; no masses,  no organomegaly. Extremities: extremities normal, atraumatic, no cyanosis or edema. Skin: skin color, texture, turgor normal, no rashes or lesions. Lymph: cervical, supraclavicular, and axillary nodes normal; no abnormal inguinal nodes palpated. Neurologic: grossly normal.  Assessment/Plan:   Diagnoses and all orders for this visit:  Routine physical examination  Binge eating disorder -     Lisdexamfetamine Dimesylate (VYVANSE) 60 MG CHEW; Chew 1 tablet by mouth daily.  History of Roux-en-Y gastric bypass  Grief -     LORazepam (ATIVAN) 1 MG tablet; Take 1 tablet (1 mg total) by mouth 2 (two) times daily as needed for anxiety. -     escitalopram (LEXAPRO) 20 MG tablet; Take 1 tablet (20 mg total) by mouth daily.  Morbid obesity (HCC)  Kidney stone -     tamsulosin (FLOMAX) 0.4 MG CAPS capsule; Take 1 capsule (0.4 mg total) by mouth daily.   Patient Counseling:   [x]     Nutrition: Stressed importance of moderation in sodium/caffeine intake, saturated fat and cholesterol, caloric balance, sufficient intake of fresh fruits, vegetables, fiber, calcium, iron, and 1 mg of folate supplement per day (for females capable of pregnancy).   [x]      Stressed the importance of regular exercise.    [x]     Substance Abuse: Discussed cessation/primary prevention of tobacco, alcohol, or other drug use; driving or other dangerous activities under the influence; availability of treatment for abuse.    [x]      Injury prevention: Discussed safety belts, safety helmets, smoke detector, smoking near bedding or upholstery.    [x]      Sexuality: Discussed sexually transmitted diseases, partner selection, use of condoms, avoidance of unintended pregnancy  and contraceptive  alternatives.    [x]     Dental health: Discussed importance of regular tooth brushing, flossing, and dental visits.   [x]      Health maintenance and immunizations reviewed. Please refer to Health maintenance section.   Helane Rima, DO [facility]

## 2018-04-20 NOTE — Patient Instructions (Signed)
Try taking Magnesium 400 mg at night for the tic.

## 2018-04-20 NOTE — Assessment & Plan Note (Addendum)
Stepfather died recently and suddenly. She has been struggling with this. Trying to stay busy. Not expressing feelings as she is worried about her husband and mother. PHQ9 reviewed today. Feels safe. Declines therapy. Will increase Lexapro to 20 mg po daily. Okay prn Ativan short term as well.

## 2018-04-22 ENCOUNTER — Encounter: Payer: Self-pay | Admitting: Family Medicine

## 2018-04-23 ENCOUNTER — Encounter: Payer: Self-pay | Admitting: Family Medicine

## 2018-04-26 MED FILL — GABAPENTIN 100 MG CAPSULE: 100 | 30 days supply | Qty: 90 | Fill #1

## 2018-04-29 ENCOUNTER — Encounter (HOSPITAL_COMMUNITY): Payer: Self-pay | Admitting: Emergency Medicine

## 2018-04-29 ENCOUNTER — Encounter: Payer: Self-pay | Admitting: Family Medicine

## 2018-04-29 ENCOUNTER — Emergency Department (HOSPITAL_COMMUNITY): Payer: 59

## 2018-04-29 ENCOUNTER — Encounter: Payer: Self-pay | Admitting: Emergency Medicine

## 2018-04-29 ENCOUNTER — Encounter: Payer: Self-pay | Admitting: Physician Assistant

## 2018-04-29 ENCOUNTER — Emergency Department (HOSPITAL_COMMUNITY)
Admission: EM | Admit: 2018-04-29 | Discharge: 2018-04-29 | Disposition: A | Discharge status: Home / Self Care | Payer: 59 | Attending: Emergency Medicine | Admitting: Emergency Medicine

## 2018-04-29 ENCOUNTER — Ambulatory Visit (INDEPENDENT_AMBULATORY_CARE_PROVIDER_SITE_OTHER): Payer: Self-pay | Admitting: Physician Assistant

## 2018-04-29 VITALS — BP 128/90 | HR 86 | Temp 98.1°F | Wt 222.0 lb

## 2018-04-29 DIAGNOSIS — R102 Pelvic and perineal pain: Secondary | ICD-10-CM | POA: Diagnosis not present

## 2018-04-29 DIAGNOSIS — N200 Calculus of kidney: Secondary | ICD-10-CM | POA: Insufficient documentation

## 2018-04-29 DIAGNOSIS — N201 Calculus of ureter: Secondary | ICD-10-CM

## 2018-04-29 DIAGNOSIS — R1032 Left lower quadrant pain: Secondary | ICD-10-CM | POA: Diagnosis not present

## 2018-04-29 DIAGNOSIS — R300 Dysuria: Secondary | ICD-10-CM

## 2018-04-29 DIAGNOSIS — R319 Hematuria, unspecified: Secondary | ICD-10-CM

## 2018-04-29 DIAGNOSIS — R109 Unspecified abdominal pain: Secondary | ICD-10-CM | POA: Diagnosis not present

## 2018-04-29 DIAGNOSIS — Z79899 Other long term (current) drug therapy: Secondary | ICD-10-CM | POA: Diagnosis not present

## 2018-04-29 DIAGNOSIS — E039 Hypothyroidism, unspecified: Secondary | ICD-10-CM | POA: Diagnosis not present

## 2018-04-29 LAB — CBC WITH DIFFERENTIAL/PLATELET
ABS IMMATURE GRANULOCYTES: 0.02 10*3/uL (ref 0.00–0.07)
BASOS ABS: 0 10*3/uL (ref 0.0–0.1)
Basophils Relative: 1 %
EOS PCT: 1 %
Eosinophils Absolute: 0 10*3/uL (ref 0.0–0.5)
HEMATOCRIT: 36.7 % (ref 36.0–46.0)
HEMOGLOBIN: 11.3 g/dL — AB (ref 12.0–15.0)
IMMATURE GRANULOCYTES: 0 %
LYMPHS ABS: 2.2 10*3/uL (ref 0.7–4.0)
LYMPHS PCT: 29 %
MCH: 25.7 pg — ABNORMAL LOW (ref 26.0–34.0)
MCHC: 30.8 g/dL (ref 30.0–36.0)
MCV: 83.4 fL (ref 80.0–100.0)
Monocytes Absolute: 0.6 10*3/uL (ref 0.1–1.0)
Monocytes Relative: 9 %
NEUTROS PCT: 60 %
Neutro Abs: 4.6 10*3/uL (ref 1.7–7.7)
Platelets: 291 10*3/uL (ref 150–400)
RBC: 4.4 MIL/uL (ref 3.87–5.11)
RDW: 13.9 % (ref 11.5–15.5)
WBC: 7.5 10*3/uL (ref 4.0–10.5)
nRBC: 0 % (ref 0.0–0.2)

## 2018-04-29 LAB — URINALYSIS, ROUTINE W REFLEX MICROSCOPIC
Bilirubin Urine: NEGATIVE
Glucose, UA: NEGATIVE mg/dL
KETONES UR: 20 mg/dL — AB
Leukocytes, UA: NEGATIVE
Nitrite: NEGATIVE
PH: 7 (ref 5.0–8.0)
Protein, ur: NEGATIVE mg/dL
SPECIFIC GRAVITY, URINE: 1.006 (ref 1.005–1.030)

## 2018-04-29 LAB — COMPREHENSIVE METABOLIC PANEL
ALK PHOS: 164 U/L — AB (ref 38–126)
ALT: 15 U/L (ref 0–44)
AST: 21 U/L (ref 15–41)
Albumin: 4.1 g/dL (ref 3.5–5.0)
Anion gap: 9 (ref 5–15)
BILIRUBIN TOTAL: 0.6 mg/dL (ref 0.3–1.2)
BUN: 9 mg/dL (ref 6–20)
CALCIUM: 9 mg/dL (ref 8.9–10.3)
CO2: 27 mmol/L (ref 22–32)
Chloride: 103 mmol/L (ref 98–111)
Creatinine, Ser: 0.56 mg/dL (ref 0.44–1.00)
GFR calc Af Amer: >60 mL/min (ref >60)
Glucose, Bld: 87 mg/dL (ref 70–99)
Potassium: 3.7 mmol/L (ref 3.5–5.1)
Sodium: 139 mmol/L (ref 135–145)
TOTAL PROTEIN: 7 g/dL (ref 6.5–8.1)

## 2018-04-29 LAB — POCT URINALYSIS DIPSTICK
Bilirubin, UA: NEGATIVE
GLUCOSE UA: NEGATIVE
Ketones, UA: NEGATIVE
LEUKOCYTES UA: NEGATIVE
NITRITE UA: NEGATIVE
PROTEIN UA: NEGATIVE
RBC UA: NEGATIVE
Urobilinogen, UA: 0.2 E.U./dL
pH, UA: 6.5 (ref 5.0–8.0)

## 2018-04-29 LAB — I-STAT BETA HCG BLOOD, ED (MC, WL, AP ONLY): I-stat hCG, quantitative: <5 m[IU]/mL (ref <5)

## 2018-04-29 LAB — POCT URINE PREGNANCY: PREG TEST UR: NEGATIVE

## 2018-04-29 IMAGING — US US RENAL
1 series · 14 of 25 positions shown · non-contrast
Comparison: CT of the abdomen and pelvis performed [DATE], and
renal ultrasound performed [DATE]

CLINICAL DATA: Acute onset of left flank pain. Personal history of
left ureteral stone.

EXAM:
RENAL / URINARY TRACT ULTRASOUND COMPLETE

[Series 1: us renal · 14 of 30 slices shown]
[im 1/30]
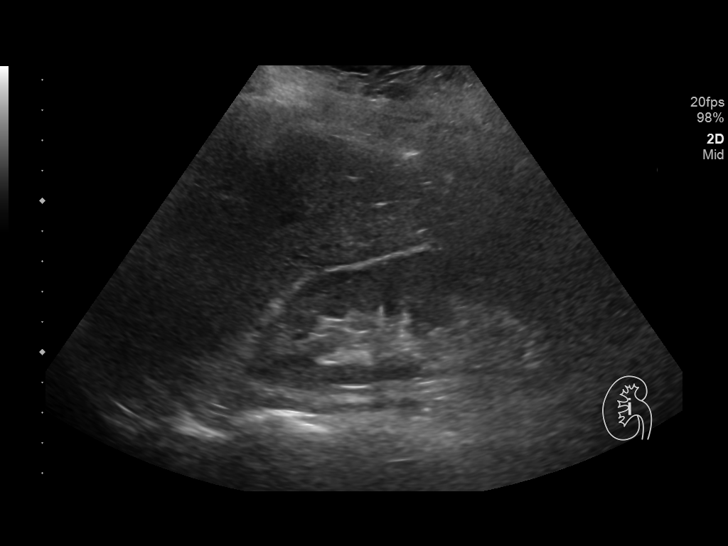
[im 3/30]
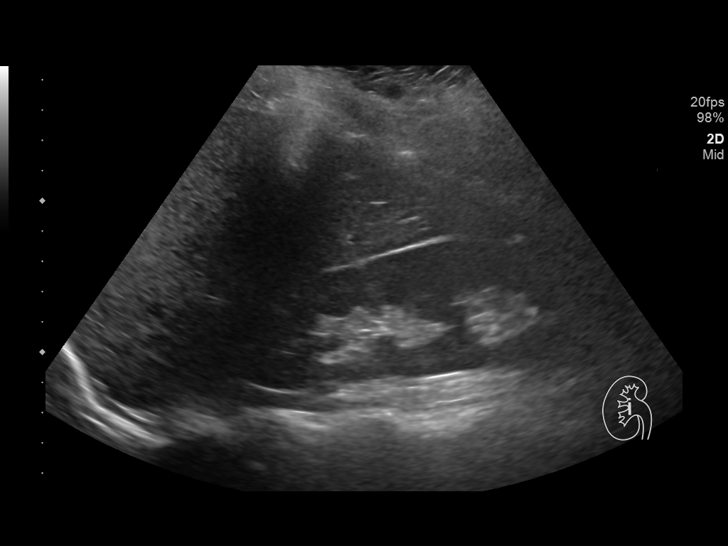
[im 5/30]
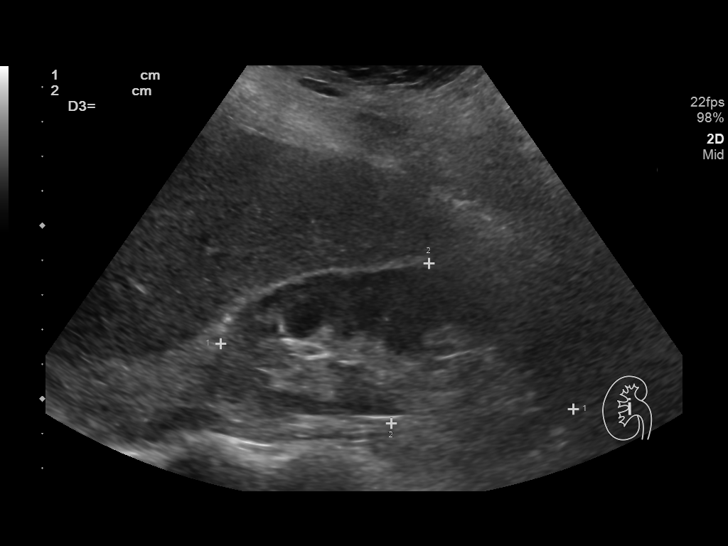
[im 8/30]
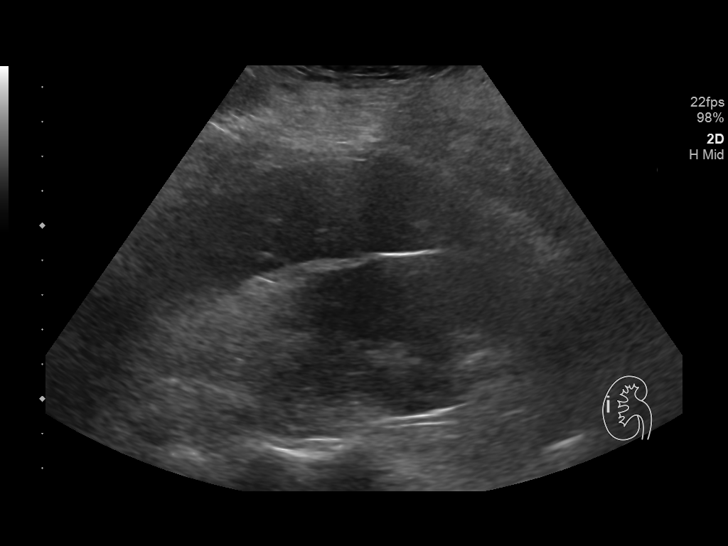
[im 10/30]
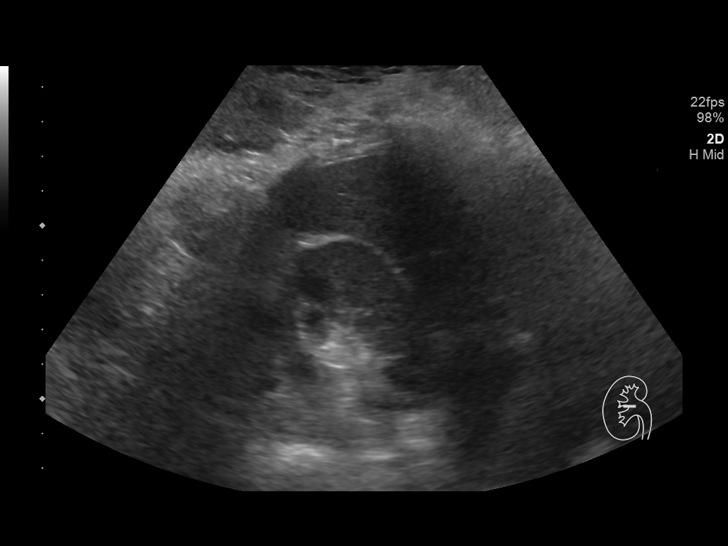
[im 11/30]
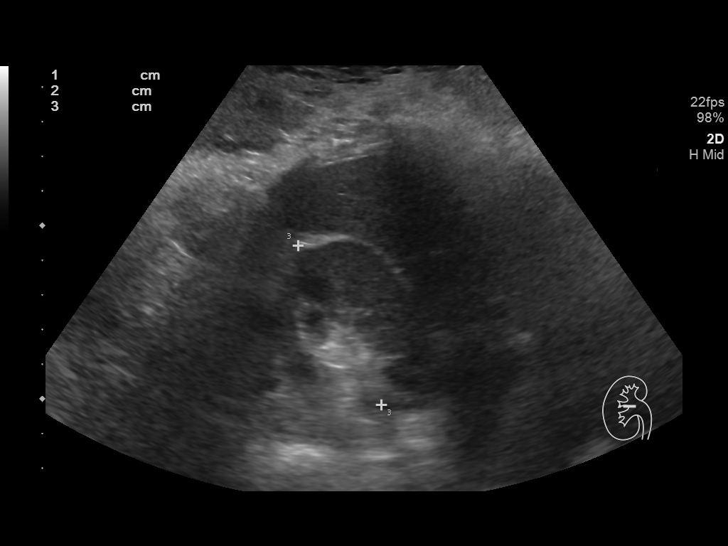
[im 14/30]
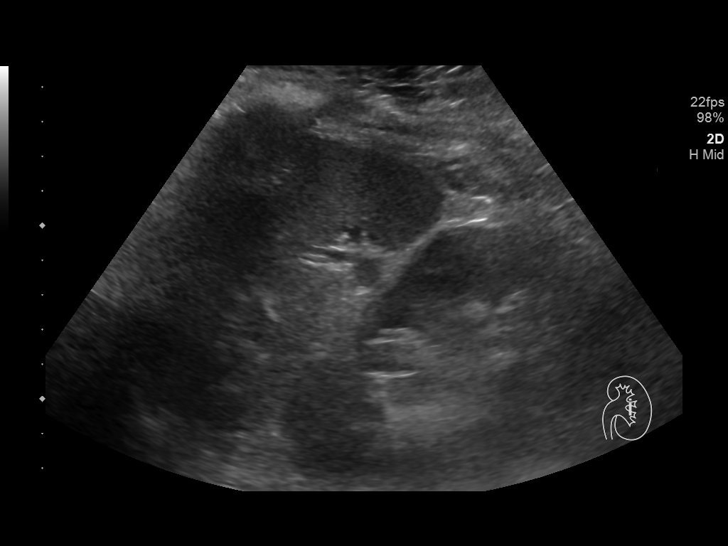
[im 16/30]
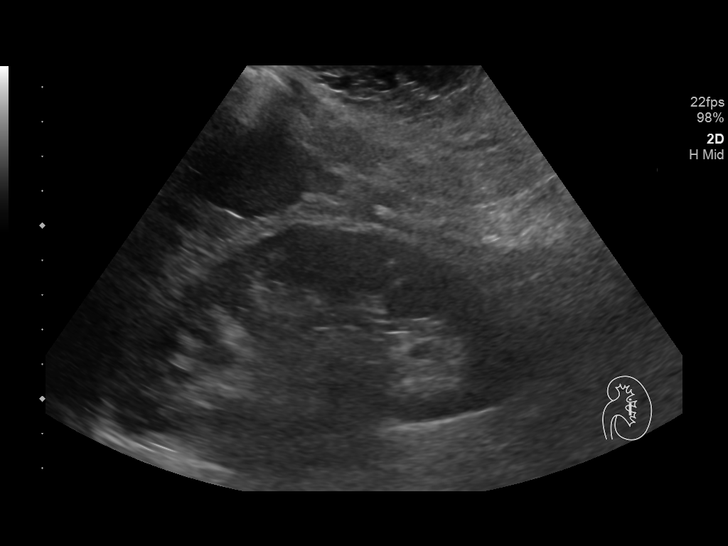
[im 19/30]
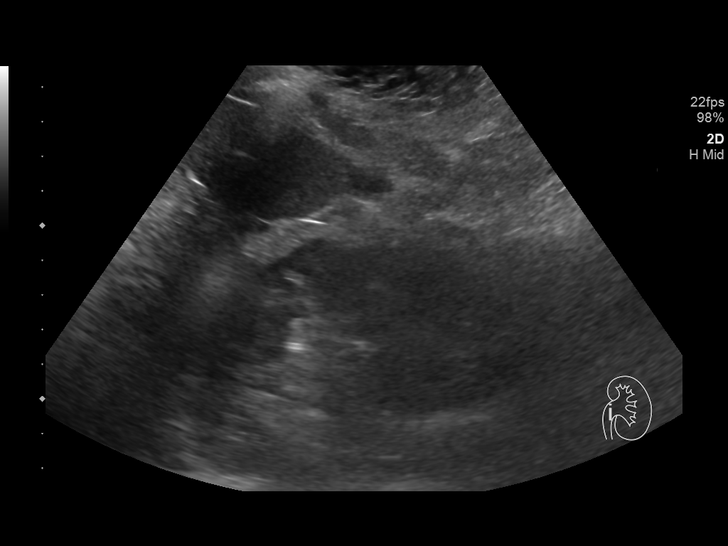
[im 20/30]
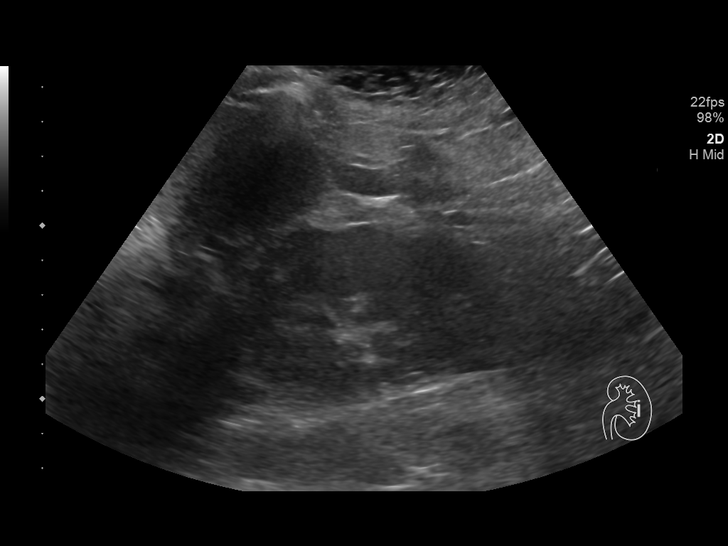
[im 22/30]
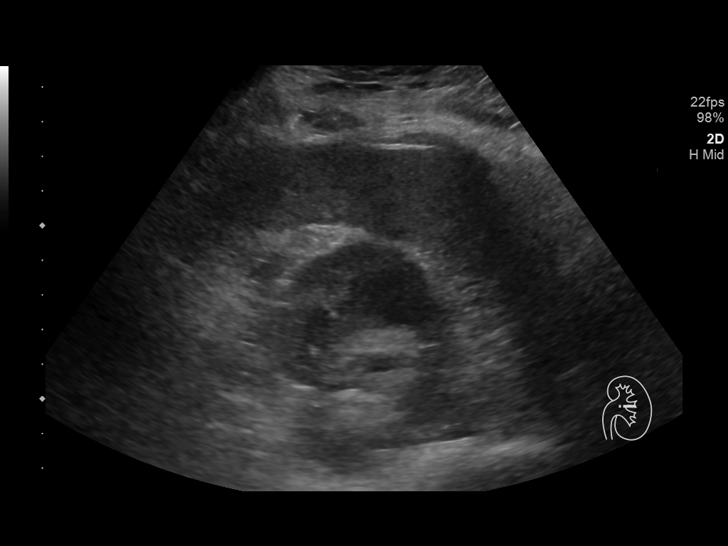
[im 25/30]
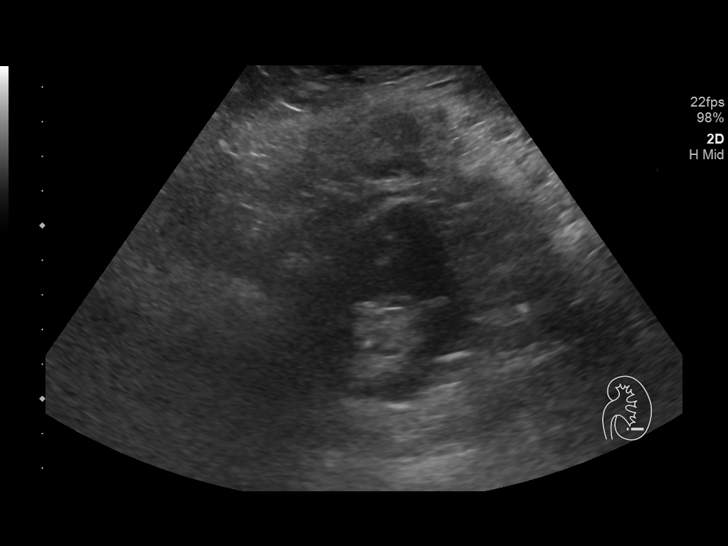
[im 27/30]
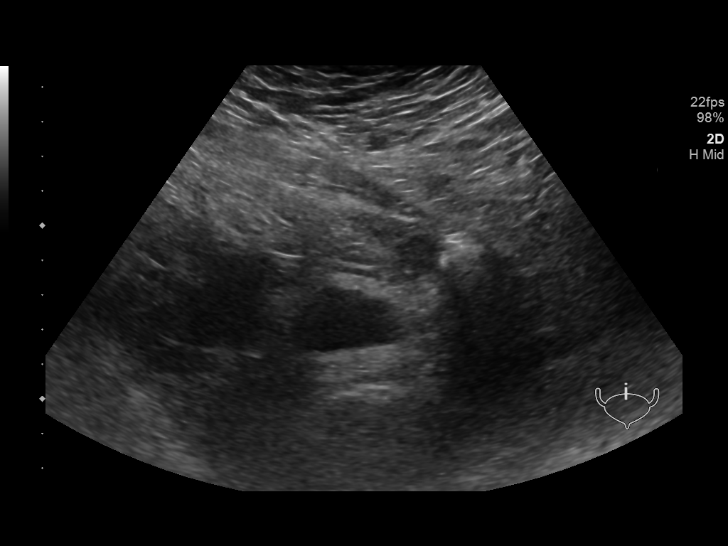
[im 30/30]
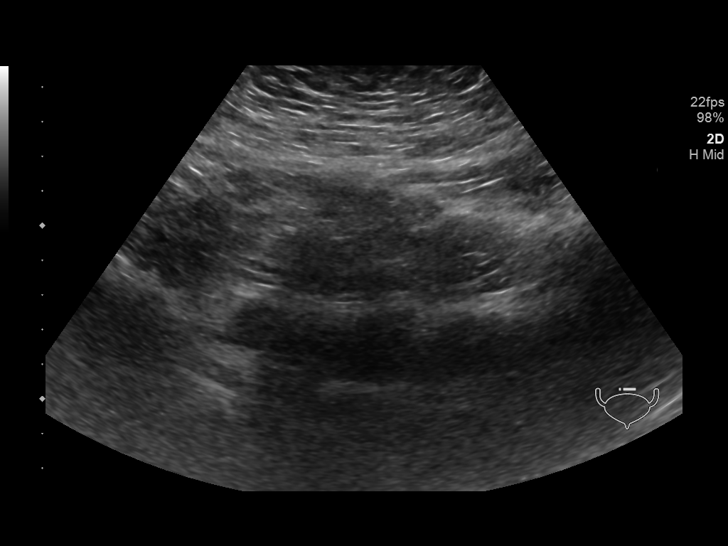

[14 of 25 positions shown; findings below may reference images not displayed]

FINDINGS: Right Kidney:

Renal measurements: 7.4 x 4.7 x 5.2 cm = volume: 133.3 mL .
Echogenicity within normal limits. No mass or hydronephrosis
visualized.

Left Kidney:

Renal measurements: 12.3 x 6.3 x 4.9 cm = volume: 197.5 mL.
Echogenicity within normal limits. No mass or hydronephrosis
visualized.

Bladder:

Largely decompressed and not well characterized.
IMPRESSION: Unremarkable renal ultrasound.  No evidence of hydronephrosis.

## 2018-04-29 IMAGING — CT CT RENAL STONE PROTOCOL
2 of 4 series · 16 of 46 positions shown, 18 images · non-contrast
Comparison: [DATE] CT abdomen and pelvis. [DATE] renal
ultrasound.

CLINICAL DATA: 31 y/o  F; left flank pain and dysuria.

EXAM:
CT ABDOMEN AND PELVIS WITHOUT CONTRAST
TECHNIQUE: Multidetector CT imaging of the abdomen and pelvis was performed
following the standard protocol without IV contrast.

[Series 2: axial st · axial · 0.89mm/px · z∈[+1126,+1542]mm · 13 of 95 slices shown, 15 images]
[im 6/95  soft-tissue]
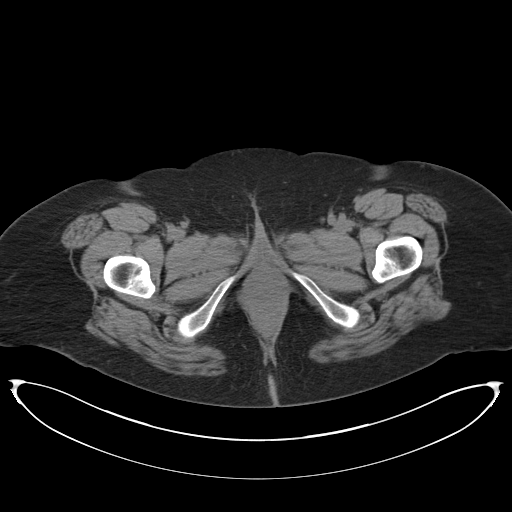
[im 6/95  bone]
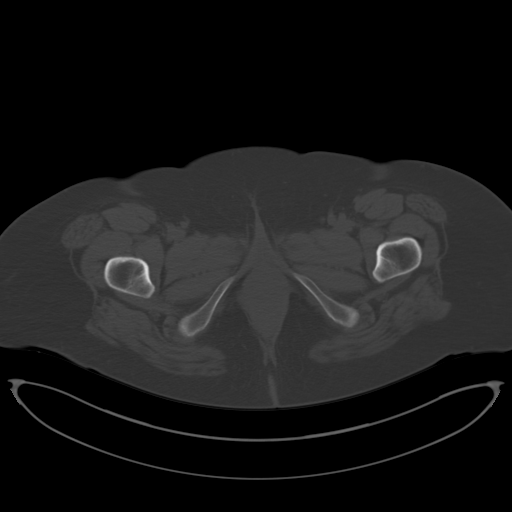
[im 11/95  soft-tissue]
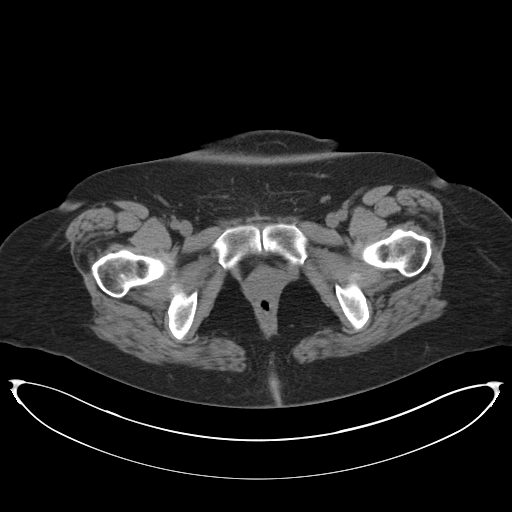
[im 21/95  soft-tissue]
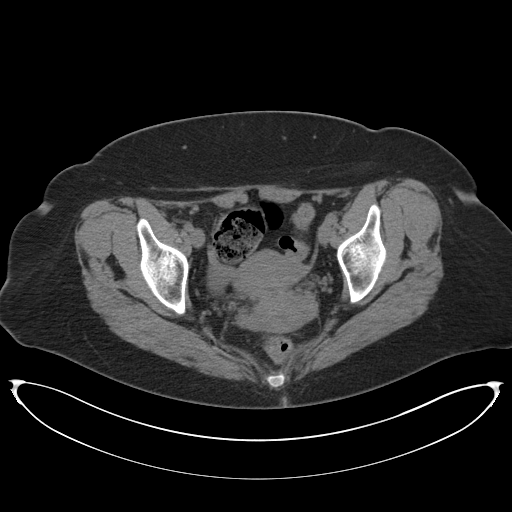
[im 27/95  soft-tissue]
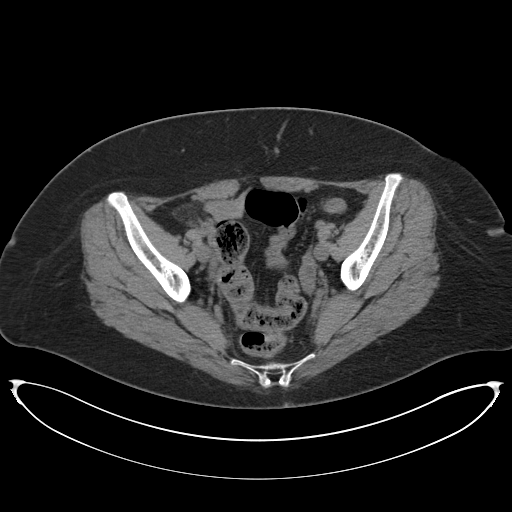
[im 32/95  soft-tissue]
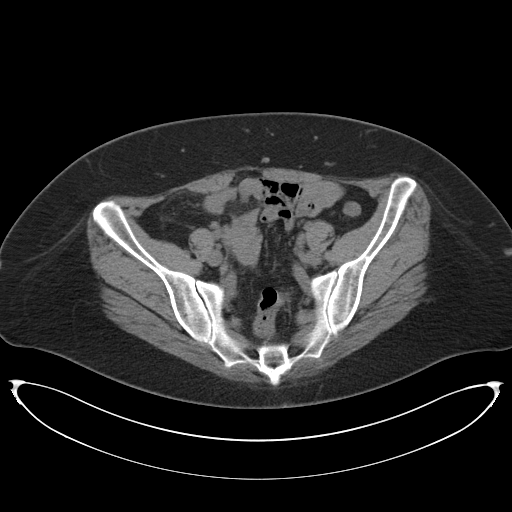
[im 42/95  soft-tissue]
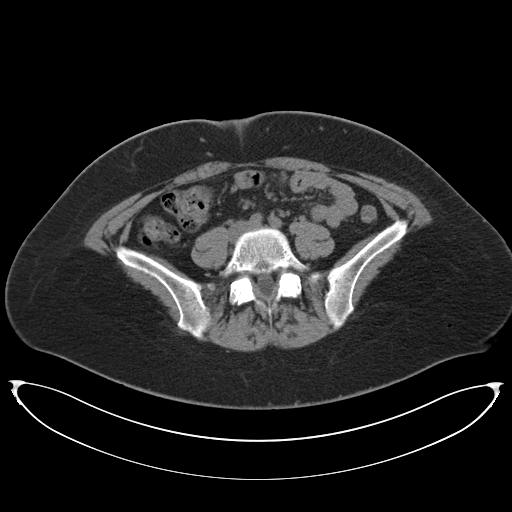
[im 48/95  soft-tissue]
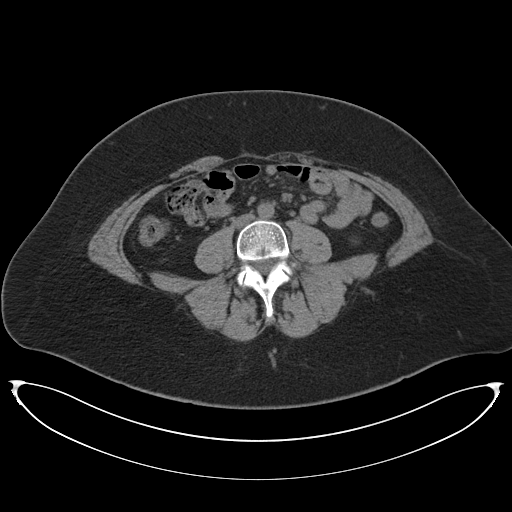
[im 53/95  soft-tissue]
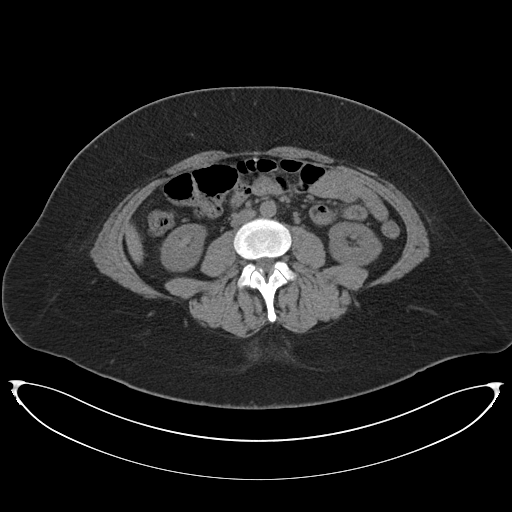
[im 63/95  soft-tissue]
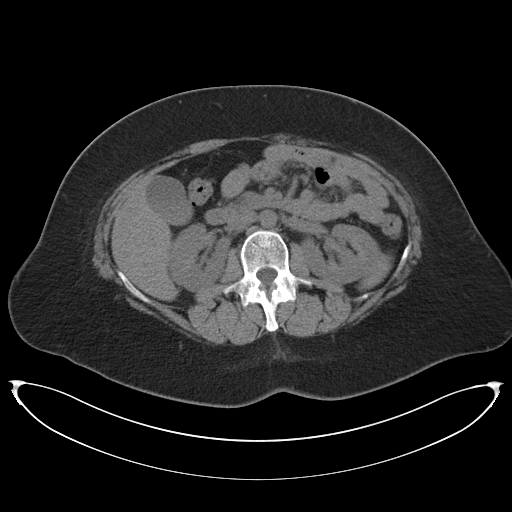
[im 63/95  bone]
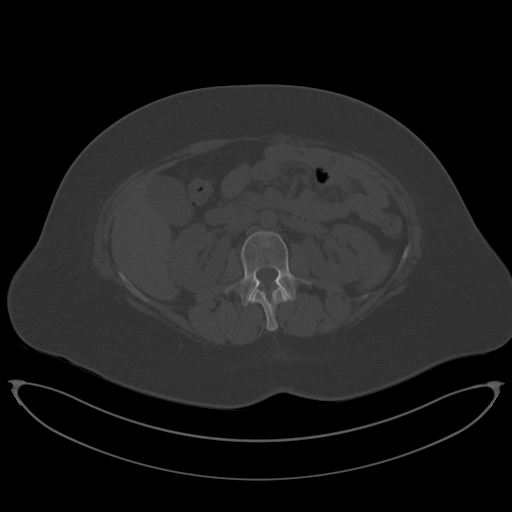
[im 68/95  soft-tissue]
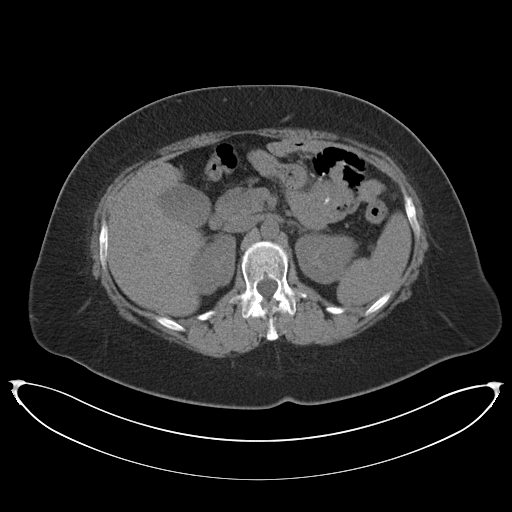
[im 74/95  soft-tissue]
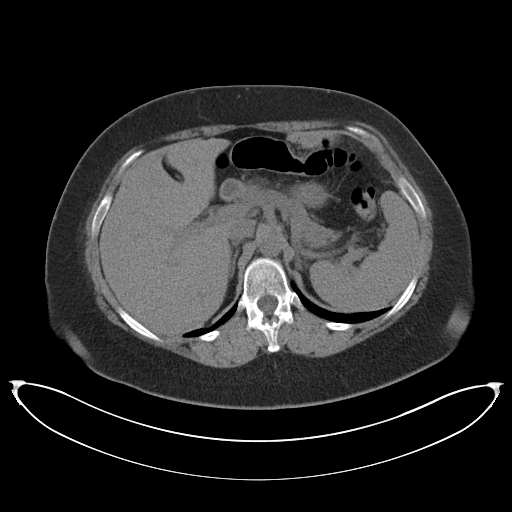
[im 84/95  soft-tissue]
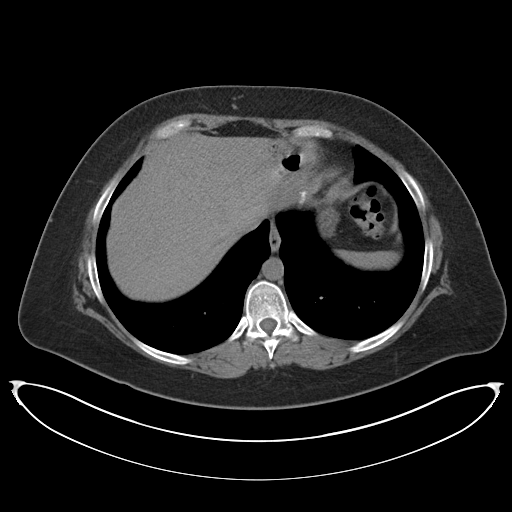
[im 89/95  soft-tissue]
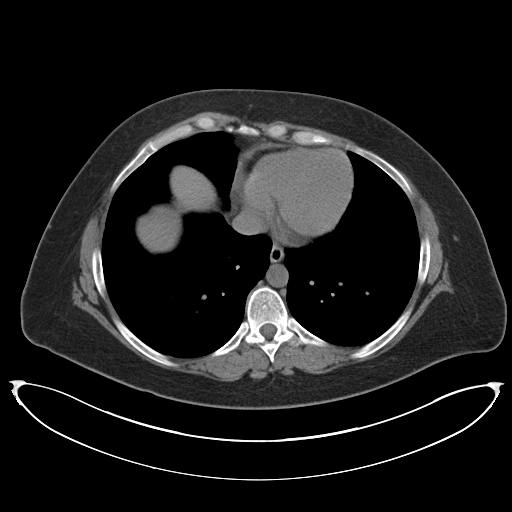

[Series 5: coronal · coronal · 0.79mm/px · 3 of 149 slices shown]
[im 50/149  soft-tissue]
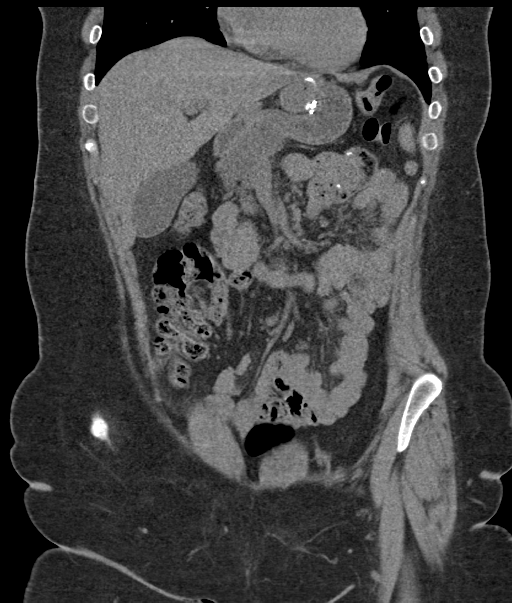
[im 66/149  soft-tissue]
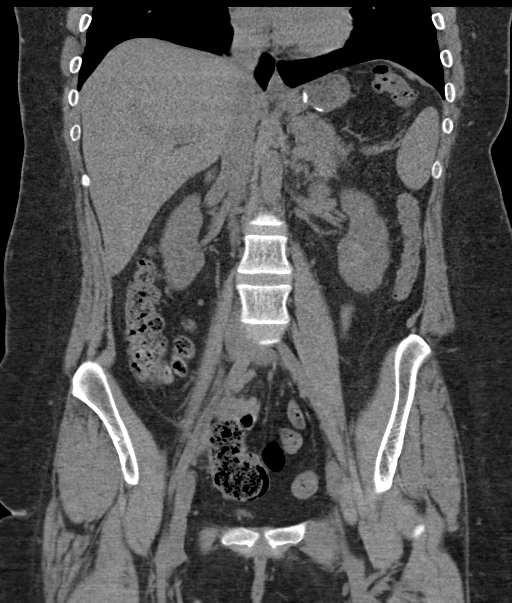
[im 83/149  soft-tissue]
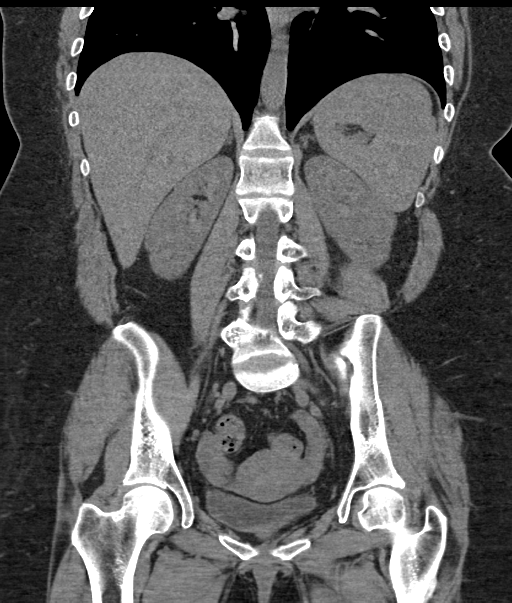

[16 of 46 positions shown; findings below may reference images not displayed]

FINDINGS: Lower chest: No acute abnormality.

Hepatobiliary: No focal liver abnormality is seen. No gallstones,
gallbladder wall thickening, or biliary dilatation.

Pancreas: Unremarkable. No pancreatic ductal dilatation or
surrounding inflammatory changes.

Spleen: Normal in size without focal abnormality.

Adrenals/Urinary Tract: Adrenal glands are unremarkable. Kidneys are
normal, without renal calculi, focal lesion, or hydronephrosis.
Bladder is unremarkable.

Stomach/Bowel: Chronic postsurgical changes related to gastric
bypass. The stomach is otherwise unremarkable. Normal appendix.. No
evidence of bowel wall thickening, distention, or inflammatory
changes.

Vascular/Lymphatic: No significant vascular findings are present. No
enlarged abdominal or pelvic lymph nodes.

Reproductive: Uterus and bilateral adnexa are unremarkable. Stable
tiny left-sided pelvic phleboliths.

Other: No abdominal wall hernia or abnormality. No abdominopelvic
ascites.

Musculoskeletal: No acute or significant osseous findings.
IMPRESSION: No acute process identified as explanation for flank pain. No kidney
stone or hydronephrosis. Unremarkable CT of abdomen and pelvis.

## 2018-04-29 MED ORDER — MORPHINE SULFATE (PF) 4 MG/ML IV SOLN
4.0000 mg | Freq: Once | INTRAVENOUS | Status: AC
  Administered 2018-04-29: 4 mg via INTRAVENOUS
  Filled 2018-04-29: qty 1

## 2018-04-29 MED ORDER — KETOROLAC TROMETHAMINE 30 MG/ML IJ SOLN
30.0000 mg | Freq: Once | INTRAMUSCULAR | Status: AC
  Administered 2018-04-29: 30 mg via INTRAVENOUS

## 2018-04-29 MED ORDER — HYDROMORPHONE HCL 1 MG/ML IJ SOLN
1.0000 mg | Freq: Once | INTRAMUSCULAR | Status: AC
  Administered 2018-04-29: 1 mg via INTRAVENOUS
  Filled 2018-04-29: qty 1

## 2018-04-29 MED ORDER — SODIUM CHLORIDE 0.9 % IV BOLUS
1000.0000 mL | Freq: Once | INTRAVENOUS | Status: AC
  Administered 2018-04-29: 1000 mL via INTRAVENOUS

## 2018-04-29 MED ORDER — ONDANSETRON HCL 4 MG/2ML IJ SOLN
4.0000 mg | Freq: Once | INTRAMUSCULAR | Status: AC
  Administered 2018-04-29: 4 mg via INTRAVENOUS
  Filled 2018-04-29: qty 2

## 2018-04-29 MED ORDER — NITROFURANTOIN MONOHYD MACRO 100 MG PO CAPS: 100.0000 mg | ORAL_CAPSULE | Freq: Two times a day (BID) | ORAL | 0 refills | Status: AC

## 2018-04-29 MED FILL — LORazepam 1 MG TABS: 1 | 10 days supply | Qty: 20 | Fill #1

## 2018-04-29 NOTE — Patient Instructions (Signed)
Thank you for choosing [facility] for your health care needs.  You have been diagnosed with a ureteral stone (passing kidney stone).  Recommend you continue to increase fluids. Drink plenty of water. May take over the counter Tylenol for pain/discomfort.  Continue to take previously prescribed Flomax, may take once pill once a day.  You have been prescribed an antibiotic, Macrobid, for possible secondary UTI.  Follow-up with family physician, urgent care, or urologist in two days if symptoms not improving.  Kidney Stones Kidney stones (urolithiasis) are rock-like masses that form inside of the kidneys. Kidneys are organs that make pee (urine). A kidney stone can cause very bad pain and can block the flow of pee. The stone usually leaves your body (passes) through your pee. You may need to have a doctor take out the stone. Follow these instructions at home: Eating and drinking  Drink enough fluid to keep your pee clear or pale yellow. This will help you pass the stone.  If told by your doctor, change the foods you eat (your diet). This may include: ? Limiting how much salt (sodium) you eat. ? Eating more fruits and vegetables. ? Limiting how much meat, poultry, fish, and eggs you eat.  Follow instructions from your doctor about eating or drinking restrictions. General instructions  Collect pee samples as told by your doctor. You may need to collect a pee sample: ? 24 hours after a stone comes out. ? 8-12 weeks after a stone comes out, and every 6-12 months after that.  Strain your pee every time you pee (urinate), for as long as told. Use the strainer that your doctor recommends.  Do not throw out the stone. Keep it so that it can be tested by your doctor.  Take over-the-counter and prescription medicines only as told by your doctor.  Keep all follow-up visits as told by your doctor. This is important. You may need follow-up tests. Preventing kidney stones To prevent another  kidney stone:  Drink enough fluid to keep your pee clear or pale yellow. This is the best way to prevent kidney stones.  Eat healthy foods.  Avoid certain foods as told by your doctor. You may be told to eat less protein.  Stay at a healthy weight.  Contact a doctor if:  You have pain that gets worse or does not get better with medicine. Get help right away if:  You have a fever or chills.  You get very bad pain.  You get new pain in your belly (abdomen).  You pass out (faint).  You cannot pee. This information is not intended to replace advice given to you by your health care provider. Make sure you discuss any questions you have with your health care provider. Document Released: 11/19/2007 Document Revised: 02/19/2016 Document Reviewed: 02/19/2016 Elsevier Interactive Patient Education  2017 [employer]

## 2018-04-29 NOTE — ED Triage Notes (Signed)
Pt c/o left flank pain and dysuria that started yesterday/. Pt was seen at [facility] and urine was "like water and given shot of Toradol". [facility] kidney [facility].

## 2018-04-29 NOTE — Progress Notes (Signed)
Patient ID: [name] [name] DOB: [birth year] AGE: 32 y.o. MRN: [id number]   PCP: Helane Rima, DO   Chief Complaint:  Chief Complaint  Patient presents with  . choice-?kidney stone seen @ E.D 04/19/2018     Subjective:    HPI:  MARGEE [name] is a 32 y.o. female presents for evaluation  Chief Complaint  Patient presents with  . choice-?kidney stone seen @ E.D 04/19/2018    32 year old female presents to [facility] with two day history of left flank pain. Constant ache. Associated left lower abdominal pain. Also a constant ache. With occasional episodes of stabbing/cramping. Patient states feels like her previous ureteral stones. Associated dysuria, increased urinary frequency (will feel she has to urinate just 5 minutes after urinating), urinary urgency, suprapubic pressure. Has taken over the counter Mylanta, Ranitidine, and used previously prescribed Flomax with no symptom improvement. Patient called PCP, was advised to be seen at urgent care to have UA performed.  Patient seen at the ED on 04/19/2018 with two day history of left flank pain. UA revealed trace blood and rare bacteria. CT scan of abdomen/pelvis revealed left hydronephrosis, suggestive of recently passed stone. Patient prescribed oxycodone for pain. Patient then seen by PCP on 04/20/2018, the following day, for previously scheduled physical. Patient prescribed Flomax due to continued discomfort. Patient states symptoms resolved after a few days, returned yesterday.  Patient with long standing history of nephrolithiasis. Has occurred on both sides; left more frequent than right. Began in 2008. Averages 2-3 episodes per year. Has been evaluated by urologist; does not regularly follow-up with urologist. Patient's parents with frequent kidney stones, suspected genetic causes. Patient typically passes stone; once required lithotripsy.  A complete, at least 10 system review of symptoms was performed,  pertinent positives and negatives as mentioned in HPI, otherwise negative.  The following portions of the patient's history were reviewed and updated as appropriate: allergies, current medications and past medical history.  Patient Active Problem List   Diagnosis Date Noted  . Nephrolithiasis   . Grief 04/20/2018  . Bleeding diathesis (HCC) 10/13/2017  . Depressive disorder 07/09/2017  . Dysmenorrhea 07/09/2017  . Menorrhagia 07/09/2017  . Reduced libido 07/09/2017  . History of cesarean section 07/09/2017  . Binge eating disorder 04/23/2017  . Morbid obesity (HCC) 04/23/2017  . 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 Pes planus 07/16/2016  . Hallux varus, acquired, right 03/18/2016  . Sleep disturbance 05/28/2015  . Iron deficiency anemia 02/22/2015  . Kidney stone 08/24/2013  . 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 Anxiety 04/10/2010    Allergies  Allergen Reactions  . Nsaids     Current Outpatient Medications on File Prior to Visit  Medication Sig Dispense Refill  . acetaminophen (TYLENOL) 500 MG tablet Take by mouth.    . diclofenac sodium (VOLTAREN) 1 % GEL Apply topically to affected area qid 100 g 1  . escitalopram (LEXAPRO) 20 MG tablet Take 1 tablet (20 mg total) by mouth daily. 90 tablet 2  . gabapentin (NEURONTIN) 100 MG capsule 100mg  in the morning and 200 mg at night 90 capsule 2  . Iron, Ferrous Sulfate, 142 (45 Fe) MG TBCR Iron (ferrous sulfate)  with vitamin C    . [START ON 05/20/2018] Lisdexamfetamine Dimesylate (VYVANSE) 60 MG CHEW Chew 1 tablet by mouth daily. 30 tablet 0  . [START ON 06/20/2018] Lisdexamfetamine Dimesylate (VYVANSE) 60 MG CHEW Chew 1 tablet by mouth daily. 30 tablet 0  . LORazepam (  ATIVAN) 1 MG tablet Take 1 tablet (1 mg total) by mouth 2 (two) times daily as needed for anxiety. 20 tablet 1  . Multiple Vitamins-Minerals (MULTIVITAMIN ADULT EXTRA C PO) multivitamin    . ondansetron (ZOFRAN ODT) 4 MG disintegrating tablet  Take 1 tablet (4 mg total) by mouth every 8 (eight) hours as needed for nausea or vomiting. 20 tablet 0  . tamsulosin (FLOMAX) 0.4 MG CAPS capsule Take 1 capsule (0.4 mg total) by mouth daily. 10 capsule 3  . VITAMIN B COMPLEX-C PO vitamin B complex     No current facility-administered medications on file prior to visit.        Objective:   Vitals:   04/29/18 1246  BP: 128/90  Pulse: 86  Temp: 98.1 F (36.7 C)  SpO2: 98%     Wt Readings from Last 3 Encounters:  04/29/18 222 lb (100.7 kg)  04/20/18 225 lb 6.4 oz (102.2 kg)  04/19/18 220 lb (99.8 kg)    Physical Exam:   General Appearance:  Alert, cooperative, appears stated age. In no acute distress. Afebrile.  Head:  Normocephalic, without obvious abnormality, atraumatic  Eyes:  PERRL, conjunctiva/corneas clear, EOM's intact, fundi benign, both eyes  Ears:  Normal TM's and external ear canals, both ears  Nose: Nares normal, septum midline. No discharge. Normal mucosa. No sinus tenderness with percussion/palpation.  Throat: Lips, mucosa, and tongue normal; teeth and gums normal. Throat reveals no erythema. Tonsils with no enlargement or exudate.  Neck: Supple, symmetrical, trachea midline, no adenopathy;  thyroid: not enlarged, symmetric, no tenderness/mass/nodules; no carotid bruit or JVD  Back:   Symmetric, no curvature, ROM normal. Left sided CVA tenderness with percussion. No right sided CVA tenderness.  Lungs:   Clear to auscultation bilaterally, respirations unlabored  Heart:  Regular rate and rhythm, S1 and S2 normal, no murmur, rub, or gallop  Abdomen:   Soft. Normoactive bowel sounds. Mild left lower quadrant tenderness (patient states per her normal with ureteral stones).  Extremities: Extremities normal, atraumatic, no cyanosis or edema  Pulses: 2+ and symmetric  Skin: Skin color, texture, turgor normal, no rashes or lesions  Lymph nodes: Cervical, supraclavicular, and axillary nodes normal  Neurologic: Normal     Assessment & Plan:    Exam findings, diagnosis etiology and medication use and indications reviewed with patient. Follow-Up and discharge instructions provided. No emergent/urgent issues found on exam.  Patient education was provided.   Patient verbalized understanding of information provided and agrees with plan of care (POC), all questions answered. The patient is advised to call or return to clinic if condition does not see an improvement in symptoms, or to seek the care of the closest emergency department if condition worsens with the below plan.    Orders Placed This Encounter  Procedures  . POCT Urinalysis Dipstick    Results for orders placed or performed in visit on 04/29/18  POCT Urinalysis Dipstick  Result Value Ref Range   Color, UA yellow    Clarity, UA clear    Glucose, UA Negative Negative   Bilirubin, UA neg    Ketones, UA neg    Spec Grav, UA <=1.005 (A) 1.010 - 1.025   Blood, UA neg    pH, UA 6.5 5.0 - 8.0   Protein, UA Negative Negative   Urobilinogen, UA 0.2 0.2 or 1.0 E.U./dL   Nitrite, UA neg    Leukocytes, UA Negative Negative   Appearance     Odor  1. Left ureteral stone  2. Dysuria  - nitrofurantoin, macrocrystal-monohydrate, (MACROBID) 100 MG capsule; Take 1 capsule (100 mg total) by mouth 2 (two) times daily for 7 days.  Dispense: 14 capsule; Refill: 0 - POCT urine pregnancy  3. Hematuria, unspecified type  - POCT Urinalysis Dipstick - POCT urine pregnancy. NEGATIVE.  Patient with two day history of left flank pain with radiation to LLQ. Patient states feels similar to previous episodes of ureteral stones (most recent episode 04/19/2018, ten days ago). Gave patient 30mg  IM Toradol injection (NSAIDs listed as an allergy due to previous gastric bypass surgery, has tolerated IM Toradol in the past). Advised patient continue to use previously prescribed Flomax. UA negative; however, prescribed Macrobid due to UTI symptoms. Advised patient  f/u at urgent care or ED if symptoms do not resolve in 2 days, will most likely need repeat CT scan of abdomen/pelvis at that time to evaluate for obstructing stone.    Janalyn HarderSamantha Beverlee Wilmarth, MHS, PA-C Rulon SeraSamantha F. Maryuri Warnke, MHS, PA-C Advanced Practice Provider [facility]  [facility]  [address], [facility], [address]st [address] [place], [place] [address] (p):  [phone] [email or web address]

## 2018-04-29 NOTE — ED Provider Notes (Signed)
[facility] DEPT Provider Note   CSN: [id number] Arrival date & time: 04/29/18  1802   History   Chief Complaint Chief Complaint  Patient presents with  . Flank Pain    HPI [name] [name] is a 32 y.o. female with past medical history significant for nephrolithiasis who presents for evaluation of left-sided flank pain.  Patient states she had sudden onset of left-sided flank pain which began yesterday.  Patient states she was seen at urgent care earlier today.  Patient states that obtain a urinalysis which was told "was like water."  Patient states that her urinalysis did not show any evidence of infection, however did show small blood.  Patient was given an injection of Toradol at that time.  Patient states that the Toradol worked for approximately 7-1/2 hours however she began to have pain approximately 1 hour ago.  She rates her pain a 7/10.  Patient states that her pain is intermittent.  Her pain is sharp and stabbing in nature.  Patient states this feels similar to previous stones.  Denies fever, chills, nausea, vomiting, abdominal pain, dysuria, hematuria, pelvic pain, vaginal discharge, diarrhea, consipation.  History obtained from patient.  No interpreter was used.   HPI  Past Medical History:  Diagnosis Date  . Anemia   . Anxiety   . DEPRESSION   . GERD (gastroesophageal reflux disease)   . Hypothyroidism    Hx of, normalized TSH during pregnancy 2011  . Infertility, female   . Migraines   . Morbid obesity ([place])    s/p RY 08/2012 - start weight 290 pounds  . Nephrolithiasis   . PCOS (polycystic ovarian syndrome)   . PONV (postoperative nausea and vomiting)   . Pregnancy induced hypertension     Patient Active Problem List   Diagnosis Date Noted  . Nephrolithiasis   . Grief 04/20/2018  . Bleeding diathesis ([place]) 10/13/2017  . Depressive disorder 07/09/2017  . Dysmenorrhea 07/09/2017  . Menorrhagia 07/09/2017  . Reduced libido  07/09/2017  . History of cesarean section 07/09/2017  . Binge eating disorder 04/23/2017  . Morbid obesity ([place]) 04/23/2017  . 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 Pes planus 07/16/2016  . Hallux varus, acquired, right 03/18/2016  . Sleep disturbance 05/28/2015  . Iron deficiency anemia 02/22/2015  . Kidney stone 08/24/2013  . 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 Anxiety 04/10/2010    Past Surgical History:  Procedure Laterality Date  . Barbie Banner OSTEOTOMY Left 06/24/2017   Procedure: [employer];  Surgeon: Trula Slade, DPM;  Location: [facility];  Service: Podiatry;  Laterality: Left;  . BUNIONECTOMY Left 06/24/2017   Procedure: Annye English;  Surgeon: Trula Slade, DPM;  Location: [facility];  Service: Podiatry;  Laterality: Left;  . CESAREAN SECTION     x 2  . FOOT SURGERY    . 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   Procedure: LAPAROSCOPIC ROUX-EN-Y GASTRIC;  Surgeon: Gayland Curry, MD;  Location: [facility];  Service: General;  Laterality: N/A;  laparoscopic roux-en-y gastric bypass  . LITHOTRIPSY Left   . TONSILLECTOMY    . TUBAL LIGATION    . UPPER GI ENDOSCOPY  08/24/2012   Procedure: UPPER GI ENDOSCOPY;  Surgeon: Gayland Curry, MD;  Location: [facility];  Service: General;;  . WISDOM TOOTH EXTRACTION       OB History    Gravida  2   Para  2   Term  2  Preterm  0   AB  0   Living  2     SAB  0   TAB  0   Ectopic  0   Multiple  0   Live Births  2            Home Medications    Prior to Admission medications   Medication Sig Start Date End Date Taking? Authorizing Provider  acetaminophen (TYLENOL) 500 MG tablet Take 1,000 mg by mouth every 6 (six) hours as needed for moderate pain.    Yes [provider]  diclofenac sodium (VOLTAREN) 1 % GEL Apply topically to affected area qid Patient taking differently: Apply 2 g topically 4 (four) times daily as needed (pain). Apply topically  to affected area qid prn for pain 12/29/17  Yes Gerda Diss, DO  escitalopram (LEXAPRO) 20 MG tablet Take 1 tablet (20 mg total) by mouth daily. 04/20/18  Yes Briscoe Deutscher, DO  gabapentin (NEURONTIN) 100 MG capsule 179m in the morning and 200 mg at night 01/29/18  Yes WBriscoe Deutscher DO  Iron, Ferrous Sulfate, 142 (45 Fe) MG TBCR Take 1 tablet by mouth daily.    Yes [provider]  Lisdexamfetamine Dimesylate (VYVANSE) 60 MG CHEW Chew 1 tablet by mouth daily. 05/20/18 06/19/18 Yes WBriscoe Deutscher DO  LORazepam (ATIVAN) 1 MG tablet Take 1 tablet (1 mg total) by mouth 2 (two) times daily as needed for anxiety. 04/20/18  Yes WBriscoe Deutscher DO  Multiple Vitamins-Minerals (MULTIVITAMIN ADULT EXTRA C PO) Take 1 tablet by mouth daily.    Yes [provider]  nitrofurantoin, macrocrystal-monohydrate, (MACROBID) 100 MG capsule Take 1 capsule (100 mg total) by mouth 2 (two) times daily for 7 days. 04/29/18 05/06/18 Yes SDarlin Priestly PA-C  ondansetron (ZOFRAN ODT) 4 MG disintegrating tablet Take 1 tablet (4 mg total) by mouth every 8 (eight) hours as needed for nausea or vomiting. 04/19/18  Yes GKinnie Feil PA-C  tamsulosin (FLOMAX) 0.4 MG CAPS capsule Take 1 capsule (0.4 mg total) by mouth daily. 04/20/18  Yes WBriscoe Deutscher DO  VITAMIN B COMPLEX-C PO Take 1 tablet by mouth daily.    Yes [provider]  Lisdexamfetamine Dimesylate (VYVANSE) 60 MG CHEW Chew 1 tablet by mouth daily. 06/20/18 07/19/18  WBriscoe Deutscher DO    Family History Family History  Problem Relation Age of Onset  . Hyperlipidemia Father   . Hypertension Father   . Kidney disease Father   . Colon cancer Father 526 . Hypertension Mother   . Hypertension Maternal Grandmother   . Uterine cancer Maternal Grandmother 76  . Diabetes Maternal Grandfather     Social History Social History   Tobacco Use  . Smoking status: Never Smoker  . Smokeless tobacco: Never Used  Substance Use Topics  .  Alcohol use: No    Comment: rare/socially  . Drug use: No     Allergies   Nsaids   Review of Systems Review of Systems  Constitutional: Negative.   Respiratory: Negative.   Cardiovascular: Negative.   Gastrointestinal: Negative.   Genitourinary: Positive for flank pain. Negative for decreased urine volume, difficulty urinating, dysuria, frequency, hematuria, menstrual problem, pelvic pain, urgency, vaginal bleeding, vaginal discharge and vaginal pain.  Musculoskeletal: Negative for back pain, neck pain and neck stiffness.  Skin: Negative.   Neurological: Negative.   All other systems reviewed and are negative.    Physical Exam Updated Vital Signs BP 133/82   Pulse 79  Temp 97.9 F (36.6 C) (Oral)   Resp 17   Ht 5' 4"  (1.626 m)   Wt 99.8 kg   SpO2 100%   BMI 37.76 kg/m   Physical Exam  Constitutional: She appears well-developed and well-nourished. No distress.  HENT:  Head: Atraumatic.  Mouth/Throat: Oropharynx is clear and moist.  Eyes: Pupils are equal, round, and reactive to light.  Neck: Normal range of motion.  Cardiovascular: Normal rate, regular rhythm, normal heart sounds and intact distal pulses. Exam reveals no gallop and no friction rub.  No murmur heard. Pulmonary/Chest: Effort normal and breath sounds normal. No stridor. No respiratory distress. She has no wheezes. She has no rales. She exhibits no tenderness.  Abdominal: Soft. Bowel sounds are normal. She exhibits no shifting dullness, no distension, no pulsatile liver, no fluid wave, no abdominal bruit, no ascites, no pulsatile midline mass and no mass. There is no hepatosplenomegaly. There is no tenderness. There is CVA tenderness. There is no rigidity, no rebound, no guarding, no tenderness at McBurney's point and negative Murphy's sign. No hernia.  Musculoskeletal: Normal range of motion.       Lumbar back: Normal.  Neurological: She is alert. She has normal strength.  Skin: Skin is warm and dry.  She is not diaphoretic.  No edema, erythema, ecchymosis or warmth.  No rashes or lesions.  Psychiatric: She has a normal mood and affect.  Nursing note and vitals reviewed.    ED Treatments / Results  Labs (all labs ordered are listed, but only abnormal results are displayed) Labs Reviewed  CBC WITH DIFFERENTIAL/PLATELET - Abnormal; Notable for the following components:      Result Value   Hemoglobin 11.3 (*)    MCH 25.7 (*)    All other components within normal limits  COMPREHENSIVE METABOLIC PANEL - Abnormal; Notable for the following components:   Alkaline Phosphatase 164 (*)    All other components within normal limits  URINALYSIS, ROUTINE W REFLEX MICROSCOPIC - Abnormal; Notable for the following components:   Hgb urine dipstick SMALL (*)    Ketones, ur 20 (*)    Bacteria, UA RARE (*)    All other components within normal limits  I-STAT BETA HCG BLOOD, ED ([facility], [facility], [facility] ONLY)    EKG None  Radiology US Renal  Result Date: 04/29/2018 CLINICAL DATA:  Acute onset of left flank pain. Personal history of left ureteral stone. EXAM: RENAL / URINARY TRACT ULTRASOUND COMPLETE COMPARISON:  CT of the abdomen and pelvis performed 04/19/2018, and renal ultrasound performed 05/12/2017 FINDINGS: Right Kidney: Renal measurements: 7.4 x 4.7 x 5.2 cm = volume: 133.3 mL . Echogenicity within normal limits. No mass or hydronephrosis visualized. Left Kidney: Renal measurements: 12.3 x 6.3 x 4.9 cm = volume: 197.5 mL. Echogenicity within normal limits. No mass or hydronephrosis visualized. Bladder: Largely decompressed and not well characterized. IMPRESSION: Unremarkable renal ultrasound.  No evidence of hydronephrosis. Electronically Signed   By: Garald Balding M.D.   On: 04/29/2018 21:11   Ct Renal Stone Study  Result Date: 04/29/2018 CLINICAL DATA:  32 y/o  F; left flank pain and dysuria. EXAM: CT ABDOMEN AND PELVIS WITHOUT CONTRAST TECHNIQUE: Multidetector CT imaging of the abdomen and pelvis  was performed following the standard protocol without IV contrast. COMPARISON:  04/19/2018 CT abdomen and pelvis. 04/29/2018 renal ultrasound. FINDINGS: Lower chest: No acute abnormality. Hepatobiliary: No focal liver abnormality is seen. No gallstones, gallbladder wall thickening, or biliary dilatation. Pancreas: Unremarkable. No pancreatic ductal dilatation or  surrounding inflammatory changes. Spleen: Normal in size without focal abnormality. Adrenals/Urinary Tract: Adrenal glands are unremarkable. Kidneys are normal, without renal calculi, focal lesion, or hydronephrosis. Bladder is unremarkable. Stomach/Bowel: Chronic postsurgical changes related to gastric bypass. The stomach is otherwise unremarkable. Normal appendix. No evidence of bowel wall thickening, distention, or inflammatory changes. Vascular/Lymphatic: No significant vascular findings are present. No enlarged abdominal or pelvic lymph nodes. Reproductive: Uterus and bilateral adnexa are unremarkable. Stable tiny left-sided pelvic phleboliths. Other: No abdominal wall hernia or abnormality. No abdominopelvic ascites. Musculoskeletal: No acute or significant osseous findings. IMPRESSION: No acute process identified as explanation for flank pain. No kidney stone or hydronephrosis. Unremarkable CT of abdomen and pelvis. Electronically Signed   By: Kristine Garbe M.D.   On: 04/29/2018 22:34    Procedures Procedures (including critical care time)  Medications Ordered in ED Medications  sodium chloride 0.9 % bolus 1,000 mL (0 mLs Intravenous Stopped 04/29/18 2115)  morphine 4 MG/ML injection 4 mg (4 mg Intravenous Given 04/29/18 2020)  ondansetron (ZOFRAN) injection 4 mg (4 mg Intravenous Given 04/29/18 2019)  HYDROmorphone (DILAUDID) injection 1 mg (1 mg Intravenous Given 04/29/18 2236)     Initial Impression / Assessment and Plan / ED Course  I have reviewed the triage vital signs and the nursing notes.  Pertinent labs & imaging  results that were available during my care of the patient were reviewed by me and considered in my medical decision making (see chart for details).  32 year old female who appears otherwise well presents for evaluation of left-sided flank pain.  Patient with known history of kidney stones.  Afebrile, nonseptic, non-ill-appearing.  No urinary symptoms.  Was seen at urgent care and was given Toradol however was not given any pain medication to take home.  Patient's pain recurred approximately 7 hours after Toradol injection. Rates her current pain a 7/10.  Patient states at urgent care she was told that her urine was very "diluted and was not accurate."  She has been able to urinate without difficulty.  Will obtain labs, urinalysis, ultrasound kidneys and reevaluate.  2015: Labs without evidence of leukocytosis, hemoglobin 09.4, metabolic panel with no evidence of electrolyte abnormalities, normal renal function, mildly elevated alk phos at 164.  Urinalysis without evidence of infection.  hCG negative.  Ultrasound renal without evidence of mass or hydronephrosis.  2145: On reevaluation patient with continued pain.  Discussed with patient ultrasound results.  Patient states she would like CT at this time. Discussed risks vs benefits given she had a recent CT scan on 04/19/18. Patient voiced understanding and would still like to proceed with CT scan.  2230: Reevaluation patient states her pain is more left lower quadrant now.  Discussed with patient that CT scan was negative for stone.  Discussed with patient that since she is now having left lower quadrant, left-sided pelvic pain this could be more pelvic pathology.  Discussed with patient pelvic exam or ultrasound to r/o ovarian torsion, ovarian cyst.  Patient does not want additional imaging at this time and requesting DC home.  Discussed with patient that ovarian torsion is a surgical emergency and cause permanent infertility.  Patient and husband and voiced  understanding and do not want testing at this time.    Patient is nontoxic, nonseptic appearing, in no apparent distress.  Patient's pain and other symptoms adequately managed in emergency department. Patient does not meet the SIRS or Sepsis criteria.  On repeat exam patient does not have a surgical abdomin  and there are no peritoneal signs.  No indication of appendicitis, bowel obstruction, bowel perforation, cholecystitis, diverticulitis, PID or ectopic pregnancy. Patient is hemodynamically stable and appropriate for DC home.  Discussed with patient strict return precautions.  Patient voiced understanding and is agreeable for follow-up.    Final Clinical Impressions(s) / ED Diagnoses   Final diagnoses:  Flank pain    ED Discharge Orders    None       Henderly, Britni A, PA-C 04/29/18 2311    Dorie Rank, MD 04/30/18 2323

## 2018-04-29 NOTE — ED Notes (Signed)
US at bedside

## 2018-04-29 NOTE — Discharge Instructions (Addendum)
Evaluated today for flank pain as well as left lower quadrant abdominal pain.  Ultrasound as well as CT scan was negative for stones.  You did not want pelvic exam at this time.  Your urinalysis as well as blood work was negative for infection.   Please follow-up with your PCP return to the emergency department for worsening symptoms such as:  Get help right away if: Your tummy hurts or is swollen. You are short of breath. You feel sick to your stomach (nauseous) and it does not go away. You cannot stop throwing up (vomiting). You feel like you will pass out or you do pass out (faint). You have blood in your pee. You have a fever and your symptoms suddenly get worse.

## 2018-05-03 ENCOUNTER — Telehealth: Payer: Self-pay | Admitting: Emergency Medicine

## 2018-05-03 NOTE — Telephone Encounter (Signed)
Left message follow up call from visit with [facility]. 

## 2018-05-21 ENCOUNTER — Encounter: Payer: Self-pay | Admitting: Family Medicine

## 2018-05-27 ENCOUNTER — Other Ambulatory Visit: Payer: Self-pay

## 2018-05-27 DIAGNOSIS — F4321 Adjustment disorder with depressed mood: Secondary | ICD-10-CM

## 2018-05-27 MED ORDER — LORAZEPAM 1 MG PO TABS: 1.0000 mg | ORAL_TABLET | Freq: Two times a day (BID) | ORAL | 0 refills | Status: DC | PRN

## 2018-05-27 MED FILL — LORazepam 1 MG TABS: 1 | 30 days supply | Qty: 60 | Fill #0

## 2018-05-31 NOTE — Progress Notes (Signed)
[name] [name] is a 32 y.o. female is here for follow up.  History of Present Illness:   [name] [name], CMA acting as scribe for Dr. Briscoe Deutscher.   HPI:   Anxiety and Grief: Patient has had increased anxiety and depression since her step-father died suddenly a few months ago. Seems to be worse during this first holiday without him. Her mom is struggling as are her children and husband. Children are now getting therapy. Though she has been offered, she has not started therapy herself. She hates talking about it because she doesn't want to cry. She is taking Ativan twice a day every day. She also is on Lexapro 32m daily. Took December off from school.   Binge Eating Disorder.  Patient is on Vyvanse 615mdaily. This helps with concentration as well as binge eating.    Within the past 3 months... Rarely Sometimes Often Always  During the past 3 months, have you had any episodes of excessive overeating? x     Do you feel distressed about your excessive overeating? x     During your episodes of excessive overeating, how often did you feel like you had no control over your eating? x     During your episodes of excessive eating, how often did you continue to eat even though you were not hungry? x     During your episodes of excessive overeating, how often were you embarrassed by how much you ate? x     During your episodes of excessive overeating, how often did you feel disgusted by yourself or guilty afterwards? x      Back pain, left side. Was seen in ED on 04/20/18 and evaluated for kidney stone with CT. Unremarkable. Flomax not helpful. She is having constant stabbing pain that travels to left side of groin. Pain can get ot 8/10 and causes back spasms. Today pain is 4/10. Worse with standing for over a few minutes. No lower extremity pain. Some bowel changes - constipation then diarrhea. FamHx of colon cancer and worried about this.   Hemorrhoid. Patient has had issue with in the past  and has been bothering lately. Stools are clay colored and has bright red blood. Very small amount in stool more when she is cleaning after bowel movement. Some constipation from not drinking enough water and from medications.  There are no preventive care reminders to display for this patient.   [facility] 06/01/2018 04/20/2018 01/12/2018  Decreased Interest 1 2 0  Down, Depressed, Hopeless 2 2 0  PHQ - 2 Score 3 4 0  Altered sleeping _0 Tired, decreased energy 2 2 0  Change in appetite _1 Feeling bad or failure about yourself  0 0 0  Trouble concentrating 1 1 0  Moving slowly or fidgety/restless 0 1 0  Suicidal thoughts 0 0 0  PHQ-9 Score _2 Difficult doing work/chores Very difficult Very difficult Somewhat difficult  Some recent data might be hidden   PMHx, SurgHx, SocialHx, FamHx, Medications, and Allergies were reviewed in the Visit Navigator and updated as appropriate.   Patient Active Problem List   Diagnosis Date Noted  . Nephrolithiasis   . Grief 04/20/2018  . Bleeding [place]athesis ([place]  . Depressive disorder 07/09/2017  . Dysmenorrhea 07/09/2017  . Menorrhagia 07/09/2017  . Reduced libido 07/09/2017  . History of cesarean section 07/09/2017  . Binge eating disorder 04/23/2017  . Morbid [place] ([place]/01/2017  .  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 Pes planus 07/16/2016  . Hallux varus, acquired, right 03/18/2016  . Sleep disturbance 05/28/2015  . Iron deficiency anemia 02/22/2015  . Kidney stone 08/24/2013  . 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 Anxiety 04/10/2010   Social History   Tobacco Use  . Smoking status: Never Smoker  . Smokeless tobacco: Never Used  Substance Use Topics  . Alcohol use: No    Comment: rare/socially  . Drug use: No   Current Medications and Allergies:   .  acetaminophen (TYLENOL) 500 MG tablet, Take 1,000 mg by mouth every 6 (six) hours as needed for moderate pain. ,  Disp: , Rfl:  .  diclofenac sodium (VOLTAREN) 1 % GEL, Apply topically to affected area qid (Patient taking differently: Apply 2 g topically 4 (four) times daily as needed (pain). Apply topically to affected area qid prn for pain), Disp: 100 g, Rfl: 1 .  escitalopram (LEXAPRO) 20 MG tablet, Take 1 tablet (20 mg total) by mouth daily., Disp: 90 tablet, Rfl: 2 .  gabapentin (NEURONTIN) 100 MG capsule, 150m in the morning and 200 mg at night, Disp: 90 capsule, Rfl: 2 .  Iron, Ferrous Sulfate, 142 (45 Fe) MG TBCR, Take 1 tablet by mouth daily. , Disp: , Rfl:  .  Lisdexamfetamine Dimesylate (VYVANSE) 60 MG CHEW, Chew 1 tablet by mouth daily., Disp: 30 tablet, Rfl: 0 .  LORazepam (ATIVAN) 1 MG tablet, Take 1 tablet (1 mg total) by mouth 2 (two) times daily as needed for anxiety., Disp: 60 tablet, Rfl: 0 .  Multiple Vitamins-Minerals (MULTIVITAMIN ADULT EXTRA C PO), Take 1 tablet by mouth daily. , Disp: , Rfl:  .  VITAMIN B COMPLEX-C PO, Take 1 tablet by mouth daily. , Disp: , Rfl:    Allergies  Allergen Reactions  . Nsaids    Review of Systems   Pertinent items are noted in the HPI. Otherwise, a complete ROS is negative.  Vitals:   Vitals:   06/01/18 0958  BP: 128/88  Pulse: 100  Temp: 98.3 F (36.8 C)  TempSrc: Oral  SpO2: 99%  Weight: 224 lb 6.4 oz (101.8 kg)  Height: _0  (1.626 m)     Body mass index is 38.52 kg/m.  Physical Exam:   Physical Exam Vitals signs and nursing note reviewed.  HENT:     Head: Normocephalic and atraumatic.  Eyes:     Pupils: Pupils are equal, round, and reactive to light.  Neck:     Musculoskeletal: Normal range of motion and neck supple.  Cardiovascular:     Rate and Rhythm: Normal rate and regular rhythm.     Heart sounds: Normal heart sounds.  Pulmonary:     Effort: Pulmonary effort is normal.  Abdominal:     Palpations: Abdomen is soft.  Skin:    General: Skin is warm.  Psychiatric:        Behavior: Behavior normal.    Results  for orders placed or performed during the hospital encounter of 04/29/18  CBC with Differential  Result Value Ref Range   WBC 7.5 4.0 - 10.5 K/uL   RBC 4.40 3.87 - 5.11 MIL/uL   Hemoglobin 11.3 (L) 12.0 - 15.0 g/dL   HCT 36.7 36.0 - 46.0 %   MCV 83.4 80.0 - 100.0 fL   MCH 25.7 (L) 26.0 - 34.0 pg   MCHC 30.8 30.0 - 36.0 g/dL   RDW 13.9 11.5 - 15.5 %  Platelets 291 150 - 400 K/uL   nRBC 0.0 0.0 - 0.2 %   Neutrophils Relative % 60 %   Neutro Abs 4.6 1.7 - 7.7 K/uL   Lymphocytes Relative 29 %   Lymphs Abs 2.2 0.7 - 4.0 K/uL   Monocytes Relative 9 %   Monocytes Absolute 0.6 0.1 - 1.0 K/uL   Eosinophils Relative 1 %   Eosinophils Absolute 0.0 0.0 - 0.5 K/uL   Basophils Relative 1 %   Basophils Absolute 0.0 0.0 - 0.1 K/uL   Immature Granulocytes 0 %   Abs Immature Granulocytes 0.02 0.00 - 0.07 K/uL  Comprehensive metabolic panel  Result Value Ref Range   Sodium 139 135 - 145 mmol/L   Potassium 3.7 3.5 - 5.1 mmol/L   Chloride 103 98 - 111 mmol/L   CO2 27 22 - 32 mmol/L   Glucose, Bld 87 70 - 99 mg/dL   BUN 9 6 - 20 mg/dL   Creatinine, Ser 0.56 0.44 - 1.00 mg/dL   Calcium 9.0 8.9 - 10.3 mg/dL   Total Protein 7.0 6.5 - 8.1 g/dL   Albumin 4.1 3.5 - 5.0 g/dL   AST 21 15 - 41 U/L   ALT 15 0 - 44 U/L   Alkaline Phosphatase 164 (H) 38 - 126 U/L   Total Bilirubin 0.6 0.3 - 1.2 mg/dL   GFR calc non Af Amer >60 >60 mL/min   GFR calc Af Amer >60 >60 mL/min   Anion gap 9 5 - 15  Urinalysis, Routine w reflex microscopic  Result Value Ref Range   Color, Urine YELLOW YELLOW   APPearance CLEAR CLEAR   Specific Gravity, Urine 1.006 1.005 - 1.030   pH 7.0 5.0 - 8.0   Glucose, UA NEGATIVE NEGATIVE mg/dL   Hgb urine dipstick SMALL (A) NEGATIVE   Bilirubin Urine NEGATIVE NEGATIVE   Ketones, ur 20 (A) NEGATIVE mg/dL   Protein, ur NEGATIVE NEGATIVE mg/dL   Nitrite NEGATIVE NEGATIVE   Leukocytes, UA NEGATIVE NEGATIVE   RBC / HPF 0-5 0 - 5 RBC/hpf   WBC, UA 0-5 0 - 5 WBC/hpf   Bacteria, UA  RARE (A) NONE SEEN   Squamous Epithelial / LPF 0-5 0 - 5   Mucus PRESENT   I-Stat Beta hCG blood, ED ([facility], [facility], [facility] only)  Result Value Ref Range   I-stat hCG, quantitative <5.0 <5 mIU/mL   Comment 3           CLINICAL DATA:  32 y/o  F; left flank pain and dysuria.  EXAM: CT ABDOMEN AND PELVIS WITHOUT CONTRAST  TECHNIQUE: Multidetector CT imaging of the abdomen and pelvis was performed following the standard protocol without IV contrast.  COMPARISON:  04/19/2018 CT abdomen and pelvis. 04/29/2018 renal ultrasound.  FINDINGS: Lower chest: No acute abnormality.  Hepatobiliary: No focal liver abnormality is seen. No gallstones, gallbladder wall thickening, or biliary dilatation.  Pancreas: Unremarkable. No pancreatic ductal dilatation or surrounding inflammatory changes.  Spleen: Normal in size without focal abnormality.  Adrenals/Urinary Tract: Adrenal glands are unremarkable. Kidneys are normal, without renal calculi, focal lesion, or hydronephrosis. Bladder is unremarkable.  Stomach/Bowel: Chronic postsurgical changes related to gastric bypass. The stomach is otherwise unremarkable. Normal appendix. No evidence of bowel wall thickening, distention, or inflammatory changes.  Vascular/Lymphatic: No significant vascular findings are present. No enlarged abdominal or pelvic lymph nodes.  Reproductive: Uterus and bilateral adnexa are unremarkable. Stable tiny left-sided pelvic phleboliths.  Other: No abdominal wall hernia or abnormality. No  abdominopelvic ascites.  Musculoskeletal: No acute or significant osseous findings.  IMPRESSION: No acute process identified as explanation for flank pain. No kidney stone or hydronephrosis. Unremarkable CT of abdomen and pelvis.  Electronically Signed   By: Kristine Garbe M.D.   On: 04/29/2018 22:34    Assessment and Plan:   Kellee was seen today for follow-up.  Diagnoses and all orders for this  visit:  Bowel habit changes Comments: Clay colored stools, with elevated alk phos, but normal CT last week. Orders: -     Ambulatory referral to Gastroenterology -     CBC with Differential/Platelet -     Comprehensive metabolic panel  Left flank pain Comments: More c/w MSK etiology. Will ask Dr. Paulla Fore to evaluate. Rec hydration, gentle stretching. Orders: -     Ambulatory referral to Sports Medicine  Hemorrhoids Comments: Discussed bowel hygeine and precuations.  Orders: -     hydrocortisone (ANUSOL-HC) 25 MG suppository; Place 1 suppository (25 mg total) rectally 2 (two) times daily.  Grief Comments: Encouraged therapy. Continue medications. Okay Ativan while going through this difficult time.   Depressive disorder Comments: As above.   History of Roux-en-Y gastric bypass  Elevated alkaline phosphatase level  Patient with Hx of gastric bypass, abdominal pain, stool changes, and elevated alk phos. May need functional study. May be most efficient to have her see her surgeon. Will discuss with patient.  . Orders and follow up as documented in [place], reviewed diet, exercise and weight control, cardiovascular risk and specific lipid/LDL goals reviewed, reviewed medications and side effects in detail.  . Reviewed expectations re: course of current medical issues. . Outlined signs and symptoms indicating need for more acute intervention. . Patient verbalized understanding and all questions were answered. . Patient received an After Visit Summary.  CMA served as Education administrator during this visit. History, Physical, and Plan performed by medical provider. The above documentation has been reviewed and is accurate and complete. Briscoe Deutscher, D.O.  Briscoe Deutscher, DO [place], [place] 06/02/2018  Records requested if needed. Time spent with the patient: 45 minutes, of which >50% was spent in obtaining information about her symptoms, reviewing her previous labs, evaluations, and  treatments, counseling her about her condition (please see the discussed topics above), and developing a plan to further investigate it; she had a number of questions which I addressed.

## 2018-06-01 ENCOUNTER — Encounter: Payer: Self-pay | Admitting: Family Medicine

## 2018-06-01 ENCOUNTER — Ambulatory Visit: Payer: 59 | Admitting: Family Medicine

## 2018-06-01 VITALS — BP 128/88 | HR 100 | Temp 98.3°F | Ht 64.0 in | Wt 224.4 lb

## 2018-06-01 DIAGNOSIS — R109 Unspecified abdominal pain: Secondary | ICD-10-CM

## 2018-06-01 DIAGNOSIS — F329 Major depressive disorder, single episode, unspecified: Secondary | ICD-10-CM | POA: Diagnosis not present

## 2018-06-01 DIAGNOSIS — Z9884 Bariatric surgery status: Secondary | ICD-10-CM

## 2018-06-01 DIAGNOSIS — F32A Depression, unspecified: Secondary | ICD-10-CM

## 2018-06-01 DIAGNOSIS — F4321 Adjustment disorder with depressed mood: Secondary | ICD-10-CM | POA: Diagnosis not present

## 2018-06-01 DIAGNOSIS — R748 Abnormal levels of other serum enzymes: Secondary | ICD-10-CM | POA: Diagnosis not present

## 2018-06-01 DIAGNOSIS — R194 Change in bowel habit: Secondary | ICD-10-CM | POA: Diagnosis not present

## 2018-06-01 DIAGNOSIS — K649 Unspecified hemorrhoids: Secondary | ICD-10-CM | POA: Diagnosis not present

## 2018-06-01 LAB — COMPREHENSIVE METABOLIC PANEL
ALT: 12 U/L (ref 0–35)
AST: 14 U/L (ref 0–37)
Albumin: 4.2 g/dL (ref 3.5–5.2)
Alkaline Phosphatase: 196 U/L — ABNORMAL HIGH (ref 39–117)
BUN: 11 mg/dL (ref 6–23)
CO2: 28 mEq/L (ref 19–32)
Calcium: 8.9 mg/dL (ref 8.4–10.5)
Chloride: 104 mEq/L (ref 96–112)
Creatinine, Ser: 0.55 mg/dL (ref 0.40–1.20)
GFR: 134.5 mL/min (ref >60.00)
Glucose, Bld: 78 mg/dL (ref 70–99)
Potassium: 4.2 mEq/L (ref 3.5–5.1)
Sodium: 138 mEq/L (ref 135–145)
Total Bilirubin: 0.3 mg/dL (ref 0.2–1.2)
Total Protein: 6.5 g/dL (ref 6.0–8.3)

## 2018-06-01 LAB — CBC WITH DIFFERENTIAL/PLATELET
Basophils Absolute: 0 10*3/uL (ref 0.0–0.1)
Basophils Relative: 0.4 % (ref 0.0–3.0)
Eosinophils Absolute: 0 10*3/uL (ref 0.0–0.7)
Eosinophils Relative: 0.7 % (ref 0.0–5.0)
HCT: 36.9 % (ref 36.0–46.0)
Hemoglobin: 12.3 g/dL (ref 12.0–15.0)
Lymphocytes Relative: 23.9 % (ref 12.0–46.0)
Lymphs Abs: 1.5 10*3/uL (ref 0.7–4.0)
MCHC: 33.3 g/dL (ref 30.0–36.0)
MCV: 78.9 fl (ref 78.0–100.0)
Monocytes Absolute: 0.6 10*3/uL (ref 0.1–1.0)
Monocytes Relative: 8.9 % (ref 3.0–12.0)
Neutro Abs: 4.2 10*3/uL (ref 1.4–7.7)
Neutrophils Relative %: 66.1 % (ref 43.0–77.0)
Platelets: 272 10*3/uL (ref 150.0–400.0)
RBC: 4.68 Mil/uL (ref 3.87–5.11)
RDW: 15.6 % — ABNORMAL HIGH (ref 11.5–15.5)
WBC: 6.4 10*3/uL (ref 4.0–10.5)

## 2018-06-01 MED ORDER — HYDROCORTISONE ACETATE 25 MG RE SUPP: 25.0000 mg | Freq: Two times a day (BID) | RECTAL | 0 refills | Status: DC

## 2018-06-04 ENCOUNTER — Encounter (HOSPITAL_COMMUNITY): Payer: Self-pay

## 2018-06-04 ENCOUNTER — Emergency Department (HOSPITAL_COMMUNITY)
Admission: EM | Admit: 2018-06-04 | Discharge: 2018-06-04 | Disposition: A | Discharge status: Home / Self Care | Payer: 59 | Attending: Emergency Medicine | Admitting: Emergency Medicine

## 2018-06-04 ENCOUNTER — Emergency Department (HOSPITAL_COMMUNITY): Payer: 59

## 2018-06-04 DIAGNOSIS — R1010 Upper abdominal pain, unspecified: Secondary | ICD-10-CM | POA: Diagnosis not present

## 2018-06-04 DIAGNOSIS — E039 Hypothyroidism, unspecified: Secondary | ICD-10-CM | POA: Insufficient documentation

## 2018-06-04 DIAGNOSIS — R112 Nausea with vomiting, unspecified: Secondary | ICD-10-CM | POA: Insufficient documentation

## 2018-06-04 DIAGNOSIS — R110 Nausea: Secondary | ICD-10-CM | POA: Diagnosis not present

## 2018-06-04 DIAGNOSIS — Z79899 Other long term (current) drug therapy: Secondary | ICD-10-CM | POA: Diagnosis not present

## 2018-06-04 DIAGNOSIS — R1084 Generalized abdominal pain: Secondary | ICD-10-CM | POA: Diagnosis not present

## 2018-06-04 DIAGNOSIS — R1011 Right upper quadrant pain: Secondary | ICD-10-CM | POA: Diagnosis not present

## 2018-06-04 LAB — URINALYSIS, ROUTINE W REFLEX MICROSCOPIC
Bilirubin Urine: NEGATIVE
Glucose, UA: 50 mg/dL — AB
Ketones, ur: NEGATIVE mg/dL
Nitrite: NEGATIVE
PH: 6 (ref 5.0–8.0)
Protein, ur: NEGATIVE mg/dL
Specific Gravity, Urine: 1.006 (ref 1.005–1.030)

## 2018-06-04 LAB — COMPREHENSIVE METABOLIC PANEL
ALT: 18 U/L (ref 0–44)
AST: 21 U/L (ref 15–41)
Albumin: 4.5 g/dL (ref 3.5–5.0)
Alkaline Phosphatase: 186 U/L — ABNORMAL HIGH (ref 38–126)
Anion gap: 9 (ref 5–15)
BUN: 9 mg/dL (ref 6–20)
CO2: 25 mmol/L (ref 22–32)
Calcium: 8.9 mg/dL (ref 8.9–10.3)
Chloride: 106 mmol/L (ref 98–111)
Creatinine, Ser: 0.55 mg/dL (ref 0.44–1.00)
GFR calc non Af Amer: >60 mL/min (ref >60)
Glucose, Bld: 85 mg/dL (ref 70–99)
Potassium: 3.5 mmol/L (ref 3.5–5.1)
SODIUM: 140 mmol/L (ref 135–145)
Total Bilirubin: 0.2 mg/dL — ABNORMAL LOW (ref 0.3–1.2)
Total Protein: 7.6 g/dL (ref 6.5–8.1)

## 2018-06-04 LAB — CBC
HCT: 40.9 % (ref 36.0–46.0)
Hemoglobin: 12.9 g/dL (ref 12.0–15.0)
MCH: 26.2 pg (ref 26.0–34.0)
MCHC: 31.5 g/dL (ref 30.0–36.0)
MCV: 83.1 fL (ref 80.0–100.0)
Platelets: 308 10*3/uL (ref 150–400)
RBC: 4.92 MIL/uL (ref 3.87–5.11)
RDW: 14.7 % (ref 11.5–15.5)
WBC: 8.3 10*3/uL (ref 4.0–10.5)
nRBC: 0 % (ref 0.0–0.2)

## 2018-06-04 LAB — I-STAT BETA HCG BLOOD, ED (MC, WL, AP ONLY): I-stat hCG, quantitative: <5 m[IU]/mL (ref <5)

## 2018-06-04 LAB — LIPASE, BLOOD: Lipase: 36 U/L (ref 11–51)

## 2018-06-04 MED ORDER — ONDANSETRON HCL 4 MG/2ML IJ SOLN
4.0000 mg | Freq: Once | INTRAMUSCULAR | Status: AC
  Administered 2018-06-04: 4 mg via INTRAVENOUS
  Filled 2018-06-04: qty 2

## 2018-06-04 MED ORDER — MORPHINE SULFATE (PF) 4 MG/ML IV SOLN
4.0000 mg | Freq: Once | INTRAVENOUS | Status: AC
  Administered 2018-06-04: 4 mg via INTRAVENOUS
  Filled 2018-06-04: qty 1

## 2018-06-04 MED ORDER — SODIUM CHLORIDE 0.9 % IV BOLUS
1000.0000 mL | Freq: Once | INTRAVENOUS | Status: AC
  Administered 2018-06-04: 1000 mL via INTRAVENOUS

## 2018-06-04 MED ORDER — DICYCLOMINE HCL 20 MG PO TABS: 20.0000 mg | ORAL_TABLET | Freq: Two times a day (BID) | ORAL | 0 refills | Status: DC | PRN

## 2018-06-04 MED ORDER — METOCLOPRAMIDE HCL 10 MG PO TABS: 10.0000 mg | ORAL_TABLET | Freq: Four times a day (QID) | ORAL | 0 refills | Status: DC | PRN

## 2018-06-04 NOTE — ED Provider Notes (Signed)
[facility] DEPT Provider Note   CSN: [id number] Arrival date & time: 06/04/18  1133     History   Chief Complaint Chief Complaint  Patient presents with  . Nausea  . Emesis    HPI [name] [name] is a 32 y.o. female.  HPI Patient states she had nausea and vomiting that started earlier this morning.  Not associated with food.  Nonbilious and nonbloody vomit.  No fever chills.  Last bowel movement was several days ago.  States she has had some clay colored stool.  No melanotic or grossly bloody stool.  No fever or chills.  States she has appointment to see her gastroenterologist and surgeon.  Denies dysuria, frequency or urgency.  No flank pain. Past Medical History:  Diagnosis Date  . Anemia   . Anxiety   . DEPRESSION   . GERD (gastroesophageal reflux disease)   . Hypothyroidism    Hx of, normalized TSH during pregnancy 2011  . Infertility, female   . Migraines   . Morbid obesity ([place])    s/p RY 08/2012 - start weight 290 pounds  . Nephrolithiasis   . PCOS (polycystic ovarian syndrome)   . PONV (postoperative nausea and vomiting)   . Pregnancy induced hypertension     Patient Active Problem List   Diagnosis Date Noted  . Nephrolithiasis   . Grief 04/20/2018  . 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 Depressive disorder 07/09/2017  . Dysmenorrhea 07/09/2017  . Menorrhagia 07/09/2017  . Reduced libido 07/09/2017  . History of cesarean section 07/09/2017  . Binge eating disorder 04/23/2017  . Morbid obesity ([place]) 04/23/2017  . 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 Pes planus 07/16/2016  . Hallux varus, acquired, right 03/18/2016  . Sleep disturbance 05/28/2015  . Iron deficiency anemia 02/22/2015  . Kidney stone 08/24/2013  . 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 Anxiety 04/10/2010    Past Surgical History:  Procedure Laterality Date  . Barbie Banner OSTEOTOMY Left 06/24/2017   Procedure: [employer];   Surgeon: Trula Slade, DPM;  Location: [facility];  Service: Podiatry;  Laterality: Left;  . BUNIONECTOMY Left 06/24/2017   Procedure: Annye English;  Surgeon: Trula Slade, DPM;  Location: [facility];  Service: Podiatry;  Laterality: Left;  . CESAREAN SECTION     x 2  . FOOT SURGERY    . 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   Procedure: LAPAROSCOPIC ROUX-EN-Y GASTRIC;  Surgeon: Gayland Curry, MD;  [facility];  Service: General;  Laterality: N/A;  laparoscopic roux-en-y gastric bypass  . LITHOTRIPSY Left   . TONSILLECTOMY    . TUBAL LIGATION    . UPPER GI ENDOSCOPY  08/24/2012   Procedure: UPPER GI ENDOSCOPY;  Surgeon: Gayland Curry, MD;  Location: [facility];  Service: General;;  . WISDOM TOOTH EXTRACTION       OB History    Gravida  2   Para  2   Term  2   Preterm  0   AB  0   Living  2     SAB  0   TAB  0   Ectopic  0   Multiple  0   Live Births  2            Home Medications    Prior to Admission medications   Medication Sig Start Date End Date Taking? Authorizing Provider  acetaminophen (TYLENOL) 500 MG tablet Take 1,000 mg by  mouth every 6 (six) hours as needed for moderate pain.    Yes [provider]  diclofenac sodium (VOLTAREN) 1 % GEL Apply topically to affected area qid Patient taking differently: Apply 2 g topically 4 (four) times daily as needed (pain). Apply topically to affected area qid prn for pain 12/29/17  Yes Gerda Diss, DO  escitalopram (LEXAPRO) 20 MG tablet Take 1 tablet (20 mg total) by mouth daily. 04/20/18  Yes Briscoe Deutscher, DO  gabapentin (NEURONTIN) 100 MG capsule 13m in the morning and 200 mg at night 01/29/18  Yes WBriscoe Deutscher DO  Iron, Ferrous Sulfate, 142 (45 Fe) MG TBCR Take 1 tablet by mouth daily.    Yes [provider]  Lisdexamfetamine Dimesylate (VYVANSE) 60 MG CHEW Chew 1 tablet by mouth daily. 05/20/18 06/19/18 Yes WBriscoe Deutscher DO  LORazepam (ATIVAN)  1 MG tablet Take 1 tablet (1 mg total) by mouth 2 (two) times daily as needed for anxiety. 05/27/18  Yes WBriscoe Deutscher DO  Multiple Vitamins-Minerals (MULTIVITAMIN ADULT EXTRA C PO) Take 1 tablet by mouth daily.    Yes [provider]  ondansetron (ZOFRAN ODT) 4 MG disintegrating tablet Take 1 tablet (4 mg total) by mouth every 8 (eight) hours as needed for nausea or vomiting. 04/19/18  Yes GCarmon SailsJ, PA-C  VITAMIN B COMPLEX-C PO Take 1 tablet by mouth daily.    Yes [provider]  dicyclomine (BENTYL) 20 MG tablet Take 1 tablet (20 mg total) by mouth 2 (two) times daily as needed for spasms. 06/04/18   YJulianne Rice MD  hydrocortisone (ANUSOL-HC) 25 MG suppository Place 1 suppository (25 mg total) rectally 2 (two) times daily. 06/01/18   WBriscoe Deutscher DO  Lisdexamfetamine Dimesylate (VYVANSE) 60 MG CHEW Chew 1 tablet by mouth daily. Patient not taking: Reported on 06/04/2018 06/20/18 07/19/18  WBriscoe Deutscher DO  metoCLOPramide (REGLAN) 10 MG tablet Take 1 tablet (10 mg total) by mouth every 6 (six) hours as needed for nausea (nausea/headache). 06/04/18   YJulianne Rice MD  tamsulosin (FLOMAX) 0.4 MG CAPS capsule Take 1 capsule (0.4 mg total) by mouth daily. Patient not taking: Reported on 06/04/2018 04/20/18   WBriscoe Deutscher DO    Family History Family History  Problem Relation Age of Onset  . Hyperlipidemia Father   . Hypertension Father   . Kidney disease Father   . Colon cancer Father 53 . Hypertension Mother   . Hypertension Maternal Grandmother   . Uterine cancer Maternal Grandmother 76  . Diabetes Maternal Grandfather     Social History Social History   Tobacco Use  . Smoking status: Never Smoker  . Smokeless tobacco: Never Used  Substance Use Topics  . Alcohol use: No    Comment: rare/socially  . Drug use: No     Allergies   Nsaids   Review of Systems Review of Systems  Constitutional: Negative for chills and fever.    Respiratory: Negative for shortness of breath.   Cardiovascular: Negative for chest pain.  Gastrointestinal: Positive for abdominal pain, nausea and vomiting. Negative for blood in stool, constipation and diarrhea.  Genitourinary: Negative for dysuria, flank pain, frequency, hematuria, pelvic pain, vaginal bleeding and vaginal discharge.  Musculoskeletal: Negative for back pain, myalgias and neck pain.  Skin: Negative for rash and wound.  Neurological: Negative for dizziness, weakness, light-headedness, numbness and headaches.  All other systems reviewed and are negative.    Physical Exam Updated Vital Signs BP 129/81 (BP Location: Left Arm)  Pulse 93   Temp 98 F (36.7 C) (Oral)   Resp 12   Ht 5' 4"  (1.626 m)   Wt 101.6 kg   SpO2 99%   BMI 38.45 kg/m   Physical Exam Vitals signs and nursing note reviewed.  Constitutional:      Appearance: Normal appearance. She is well-developed.  HENT:     Head: Normocephalic and atraumatic.     Nose: Nose normal.     Mouth/Throat:     Mouth: Mucous membranes are moist.     Pharynx: No oropharyngeal exudate.  Eyes:     Pupils: Pupils are equal, round, and reactive to light.  Neck:     Musculoskeletal: Normal range of motion and neck supple. No neck rigidity or muscular tenderness.     Vascular: No carotid bruit.  Cardiovascular:     Rate and Rhythm: Normal rate and regular rhythm.  Pulmonary:     Effort: Pulmonary effort is normal. No respiratory distress.     Breath sounds: Normal breath sounds. No stridor. No wheezing, rhonchi or rales.  Chest:     Chest wall: No tenderness.  Abdominal:     General: Bowel sounds are normal.     Palpations: Abdomen is soft.     Tenderness: There is abdominal tenderness. There is no guarding or rebound.     Comments: Mild diffuse abdominal tenderness to palpation.  No rebound or guarding.  Musculoskeletal: Normal range of motion.        General: No swelling or tenderness.     Comments: No CVA  tenderness bilaterally.  Lymphadenopathy:     Cervical: No cervical adenopathy.  Skin:    General: Skin is warm and dry.     Findings: No erythema or rash.  Neurological:     General: No focal deficit present.     Mental Status: She is alert and oriented to person, place, and time.  Psychiatric:        Behavior: Behavior normal.      ED Treatments / Results  Labs (all labs ordered are listed, but only abnormal results are displayed) Labs Reviewed  COMPREHENSIVE METABOLIC PANEL - Abnormal; Notable for the following components:      Result Value   Alkaline Phosphatase 186 (*)    Total Bilirubin 0.2 (*)    All other components within normal limits  URINALYSIS, ROUTINE W REFLEX MICROSCOPIC - Abnormal; Notable for the following components:   Glucose, UA 50 (*)    Hgb urine dipstick SMALL (*)    Leukocytes, UA MODERATE (*)    Bacteria, UA RARE (*)    All other components within normal limits  URINE CULTURE  LIPASE, BLOOD  CBC  I-STAT BETA HCG BLOOD, ED ([facility], [facility], [facility] ONLY)    EKG None  Radiology US Abdomen Limited  Result Date: 06/04/2018 CLINICAL DATA:  Right upper quadrant pain with nausea. EXAM: ULTRASOUND ABDOMEN LIMITED RIGHT UPPER QUADRANT COMPARISON:  April 29, 2018 CT abdomen and pelvis FINDINGS: Gallbladder: No gallstones or wall thickening visualized. There is no pericholecystic fluid. No sonographic Murphy sign noted by sonographer. Common bile duct: Diameter: 3 mm. No intrahepatic or extrahepatic biliary duct dilatation. Liver: No focal lesion identified. Within normal limits in parenchymal echogenicity. 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 Portal vein is patent on color Doppler imaging with normal direction of blood flow towards the liver. IMPRESSION: Study within normal limits. Electronically Signed   By: Gwyndolyn Saxon  Jasmine December III M.D.   On: 06/04/2018 12:40    Procedures Procedures (including critical care  time)  Medications Ordered in ED Medications  morphine 4 MG/ML injection 4 mg (4 mg Intravenous Given 06/04/18 1203)  ondansetron (ZOFRAN) injection 4 mg (4 mg Intravenous Given 06/04/18 1203)  sodium chloride 0.9 % bolus 1,000 mL (1,000 mLs Intravenous New Bag/Given 06/04/18 1203)  morphine 4 MG/ML injection 4 mg (4 mg Intravenous Given 06/04/18 1312)  ondansetron (ZOFRAN) injection 4 mg (4 mg Intravenous Given 06/04/18 1311)     Initial Impression / Assessment and Plan / ED Course  I have reviewed the triage vital signs and the nursing notes.  Pertinent labs & imaging results that were available during my care of the patient were reviewed by me and considered in my medical decision making (see chart for details).     Dental exam is benign.  Patient has chronically elevated alk phos.  Normal white blood cell count.  Normal liver function test.  Moderate leukocytes and some white blood cells and the urine.  Patient denies any urinary symptoms.  Urine was sent for culture.  Ultrasound abdomen without acute findings.  Do not believe that further resentment imaging is necessary at this point.  Patient states her symptoms have improved.  Continues to have benign abdominal exam.  No further vomiting.  Advised to follow-up with her gastroenterologist.  Return precautions given.  Final Clinical Impressions(s) / ED Diagnoses   Final diagnoses:  Non-intractable vomiting with nausea, unspecified vomiting type  Pain of upper abdomen    ED Discharge Orders         Ordered    dicyclomine (BENTYL) 20 MG tablet  2 times daily PRN     06/04/18 1317    metoCLOPramide (REGLAN) 10 MG tablet  Every 6 hours PRN     06/04/18 1317           Julianne Rice, MD 06/04/18 1322

## 2018-06-04 NOTE — ED Triage Notes (Signed)
Pt presents with c/o nausea, vomiting that started today. Pt reports she went to her doctor earlier this week and was told she had "elevated gallbladder levels".

## 2018-06-04 NOTE — ED Notes (Signed)
Patient given discharge teaching and verbalized understanding. Patient ambulated out of ED with a steady gait. 

## 2018-06-06 LAB — URINE CULTURE: Culture: 30000 — AB

## 2018-06-07 ENCOUNTER — Telehealth: Payer: Self-pay | Admitting: Emergency Medicine

## 2018-06-07 NOTE — Telephone Encounter (Signed)
Post ED Visit - Positive Culture Follow-up  Culture report reviewed by antimicrobial stewardship pharmacist:  []  Enzo BiNathan Batchelder, Pharm.D. []  Celedonio MiyamotoJeremy Frens, Pharm.D., BCPS AQ-ID []  Garvin FilaMike Maccia, Pharm.D., BCPS []  Georgina PillionElizabeth Martin, Pharm.D., BCPS []  JeffersonMinh Pham, [address] BoulevardPharm.D., BCPS, AAHIVP []  Estella HuskMichelle Turner, Pharm.D., BCPS, AAHIVP [x]  Lysle Pearlachel Rumbarger, PharmD, BCPS []  Phillips Climeshuy Dang, PharmD, BCPS []  Agapito GamesAlison Masters, PharmD, BCPS []  Verlan FriendsErin Deja, PharmD  Positive Urine culture No further patient follow-up is required at this time.  Carollee HerterShannon Toshiye Kever 06/07/2018, 9:40 AM

## 2018-06-15 ENCOUNTER — Other Ambulatory Visit: Payer: Self-pay | Admitting: General Surgery

## 2018-06-15 ENCOUNTER — Other Ambulatory Visit (HOSPITAL_COMMUNITY): Payer: Self-pay | Admitting: General Surgery

## 2018-06-15 DIAGNOSIS — R110 Nausea: Secondary | ICD-10-CM

## 2018-06-17 MED FILL — GABAPENTIN 100 MG CAPSULE: 100 | 30 days supply | Qty: 90 | Fill #2

## 2018-06-17 MED FILL — VYVANSE 60 MG CHEW: 60 | 30 days supply | Qty: 30 | Fill #0

## 2018-06-22 ENCOUNTER — Ambulatory Visit: Payer: 59 | Admitting: Sports Medicine

## 2018-06-30 ENCOUNTER — Other Ambulatory Visit: Payer: Self-pay | Admitting: Family Medicine

## 2018-06-30 DIAGNOSIS — F4321 Adjustment disorder with depressed mood: Secondary | ICD-10-CM

## 2018-06-30 MED FILL — LORazepam 1 MG TABS: 1 | 30 days supply | Qty: 60 | Fill #0

## 2018-06-30 MED FILL — DICLOFENAC SODIUM 1 % GEL: 1 | 25 days supply | Qty: 100 | Fill #1

## 2018-06-30 NOTE — Telephone Encounter (Signed)
Next Office Visit: 08/17/2018  Last Office Visit : 06/01/2018  Last Refill: 05/27/2018  Okay for refill?    Please advise

## 2018-06-30 NOTE — Telephone Encounter (Signed)
Ok to fill 

## 2018-07-02 ENCOUNTER — Encounter: Payer: Self-pay | Admitting: Gastroenterology

## 2018-07-02 ENCOUNTER — Emergency Department (HOSPITAL_COMMUNITY)
Admission: EM | Admit: 2018-07-02 | Discharge: 2018-07-02 | Disposition: A | Discharge status: Home / Self Care | Payer: No Typology Code available for payment source | Attending: Emergency Medicine | Admitting: Emergency Medicine

## 2018-07-02 ENCOUNTER — Emergency Department (HOSPITAL_COMMUNITY): Payer: No Typology Code available for payment source

## 2018-07-02 ENCOUNTER — Other Ambulatory Visit: Payer: Self-pay

## 2018-07-02 ENCOUNTER — Encounter (HOSPITAL_COMMUNITY): Payer: Self-pay

## 2018-07-02 DIAGNOSIS — R1011 Right upper quadrant pain: Secondary | ICD-10-CM | POA: Diagnosis not present

## 2018-07-02 DIAGNOSIS — Z9884 Bariatric surgery status: Secondary | ICD-10-CM | POA: Insufficient documentation

## 2018-07-02 DIAGNOSIS — R1110 Vomiting, unspecified: Secondary | ICD-10-CM | POA: Diagnosis not present

## 2018-07-02 DIAGNOSIS — E039 Hypothyroidism, unspecified: Secondary | ICD-10-CM | POA: Insufficient documentation

## 2018-07-02 DIAGNOSIS — Z79899 Other long term (current) drug therapy: Secondary | ICD-10-CM | POA: Diagnosis not present

## 2018-07-02 LAB — COMPREHENSIVE METABOLIC PANEL
ALT: 18 U/L (ref 0–44)
AST: 20 U/L (ref 15–41)
Albumin: 3.6 g/dL (ref 3.5–5.0)
Alkaline Phosphatase: 153 U/L — ABNORMAL HIGH (ref 38–126)
Anion gap: 7 (ref 5–15)
BUN: 13 mg/dL (ref 6–20)
CO2: 26 mmol/L (ref 22–32)
Calcium: 8.5 mg/dL — ABNORMAL LOW (ref 8.9–10.3)
Chloride: 107 mmol/L (ref 98–111)
Creatinine, Ser: 0.54 mg/dL (ref 0.44–1.00)
GFR calc Af Amer: >60 mL/min (ref >60)
GFR calc non Af Amer: >60 mL/min (ref >60)
Glucose, Bld: 84 mg/dL (ref 70–99)
Potassium: 3.7 mmol/L (ref 3.5–5.1)
Sodium: 140 mmol/L (ref 135–145)
TOTAL PROTEIN: 6.6 g/dL (ref 6.5–8.1)
Total Bilirubin: 0.3 mg/dL (ref 0.3–1.2)

## 2018-07-02 LAB — URINALYSIS, ROUTINE W REFLEX MICROSCOPIC
Bilirubin Urine: NEGATIVE
Glucose, UA: NEGATIVE mg/dL
Hgb urine dipstick: NEGATIVE
Ketones, ur: NEGATIVE mg/dL
NITRITE: NEGATIVE
Protein, ur: NEGATIVE mg/dL
Specific Gravity, Urine: 1.011 (ref 1.005–1.030)
pH: 6 (ref 5.0–8.0)

## 2018-07-02 LAB — CBC
HCT: 35.5 % — ABNORMAL LOW (ref 36.0–46.0)
HEMOGLOBIN: 11.1 g/dL — AB (ref 12.0–15.0)
MCH: 25.9 pg — ABNORMAL LOW (ref 26.0–34.0)
MCHC: 31.3 g/dL (ref 30.0–36.0)
MCV: 82.9 fL (ref 80.0–100.0)
Platelets: 288 10*3/uL (ref 150–400)
RBC: 4.28 MIL/uL (ref 3.87–5.11)
RDW: 15.1 % (ref 11.5–15.5)
WBC: 7.6 10*3/uL (ref 4.0–10.5)
nRBC: 0 % (ref 0.0–0.2)

## 2018-07-02 LAB — LIPASE, BLOOD: Lipase: 36 U/L (ref 11–51)

## 2018-07-02 LAB — PREGNANCY, URINE: Preg Test, Ur: NEGATIVE

## 2018-07-02 IMAGING — CR DG ABDOMEN ACUTE W/ 1V CHEST
4 series · 4 of 4 positions shown · non-contrast
Comparison: Chest radiograph [DATE]; CT abdomen and
pelvis [DATE].

CLINICAL DATA: Abdominal pain nausea and vomiting

EXAM:
DG ABDOMEN ACUTE W/ 1V CHEST

[w chest pa]
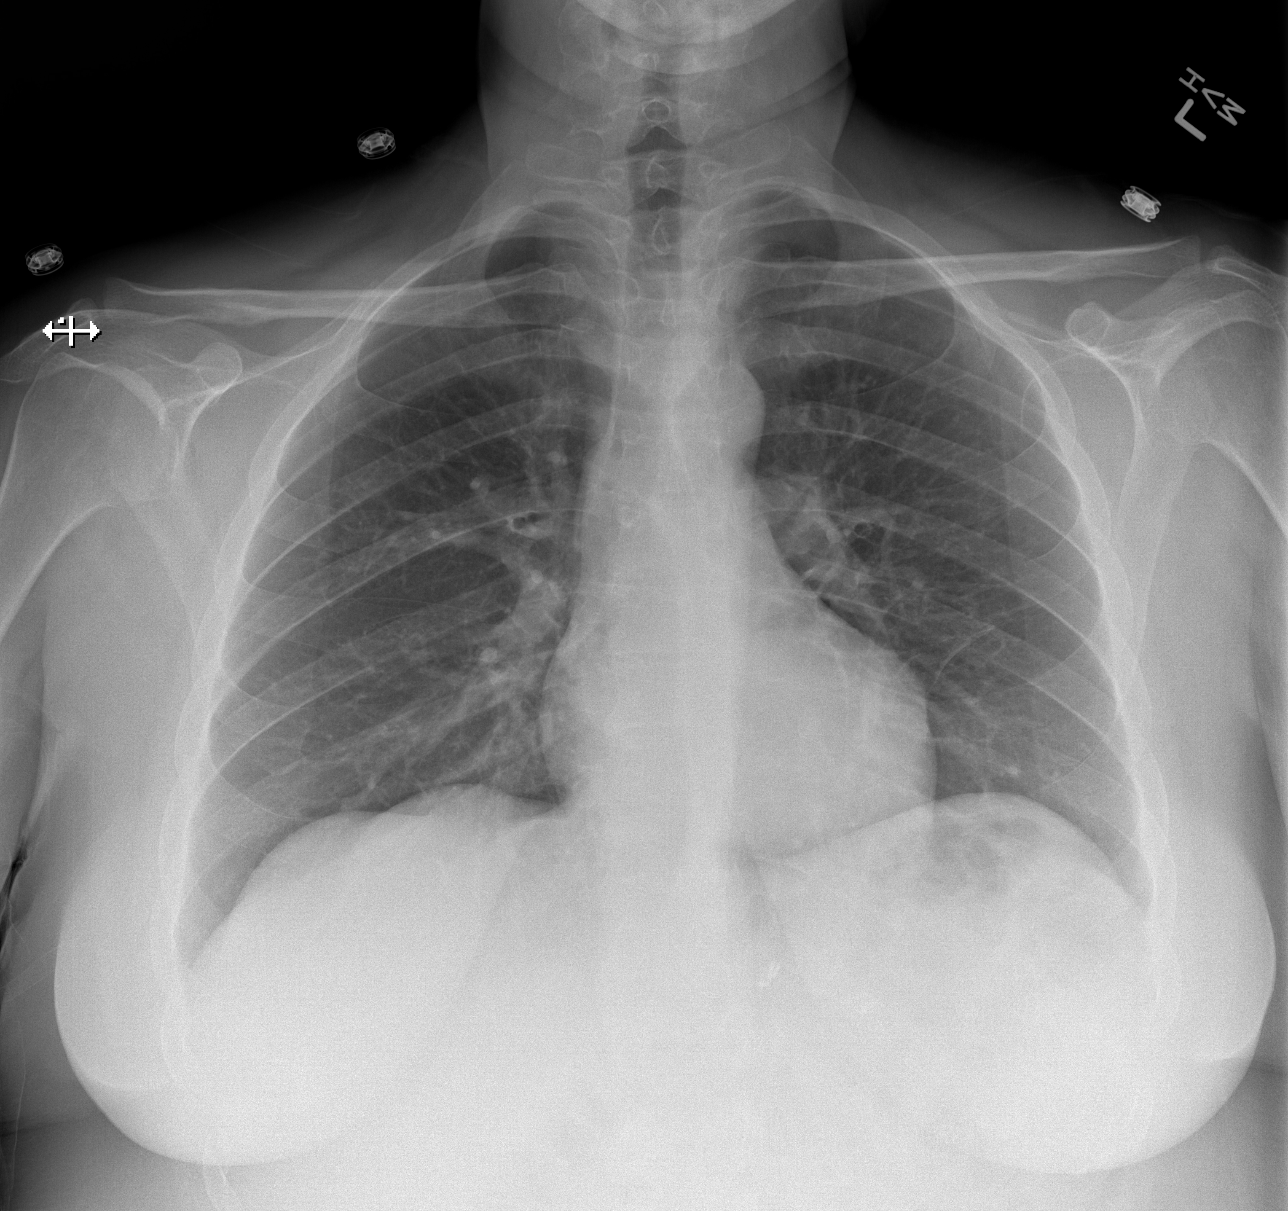

[w abdomen upright]
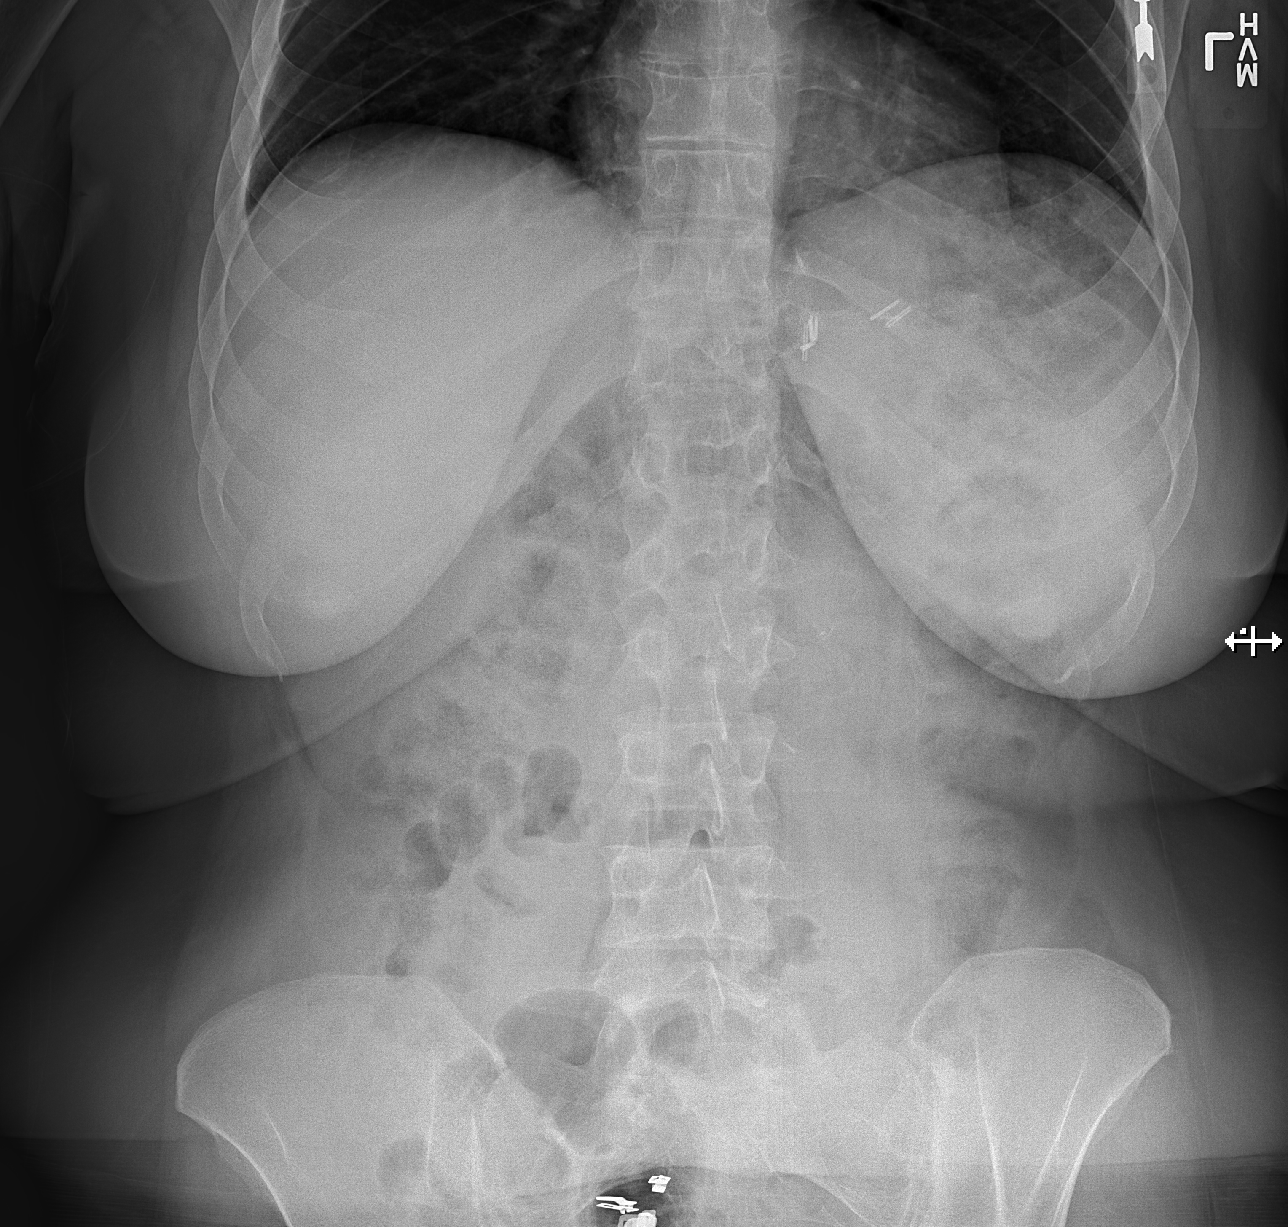

[t abdomen supine (1 of 2)]
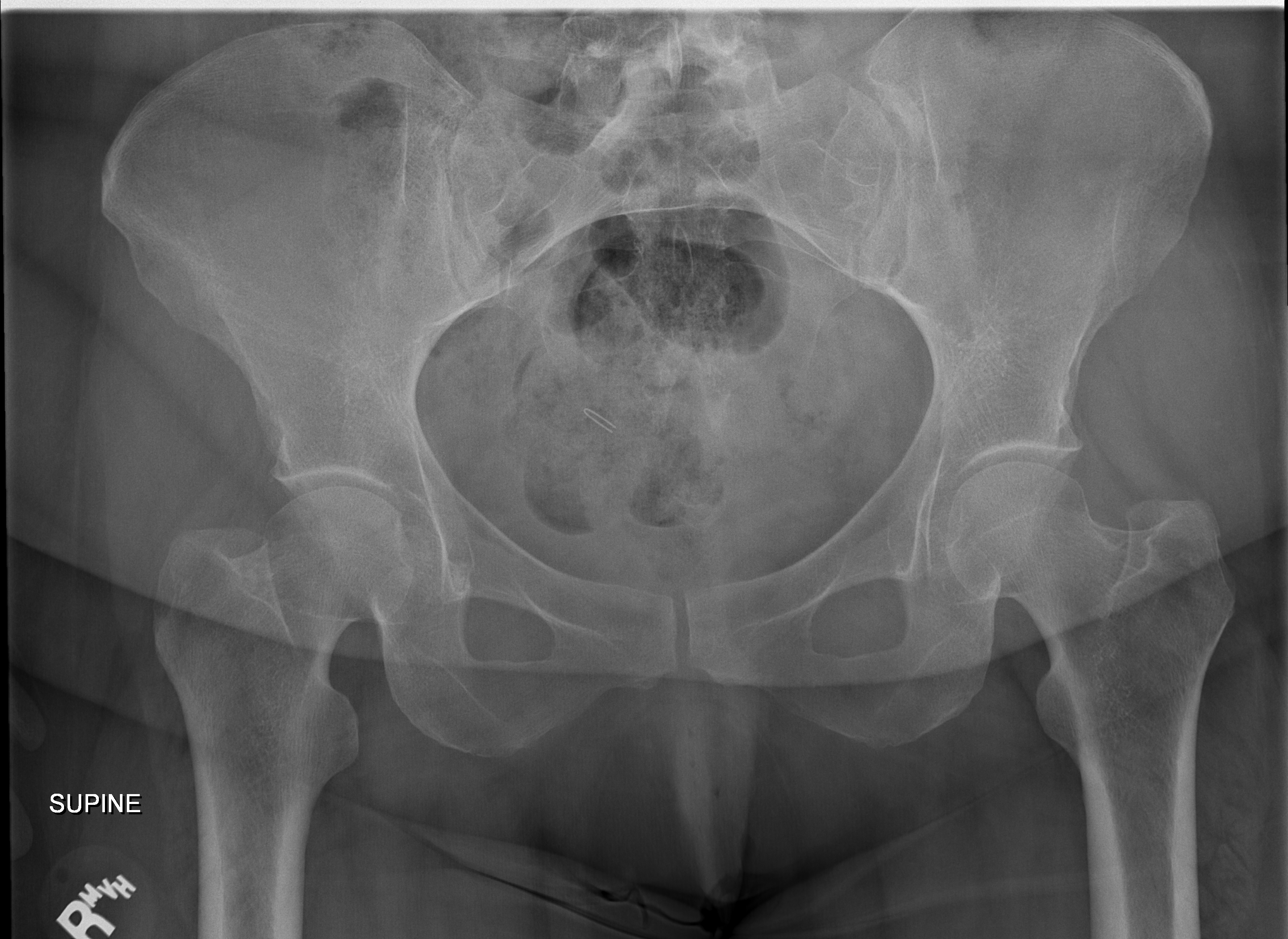

[t abdomen supine (2 of 2)]
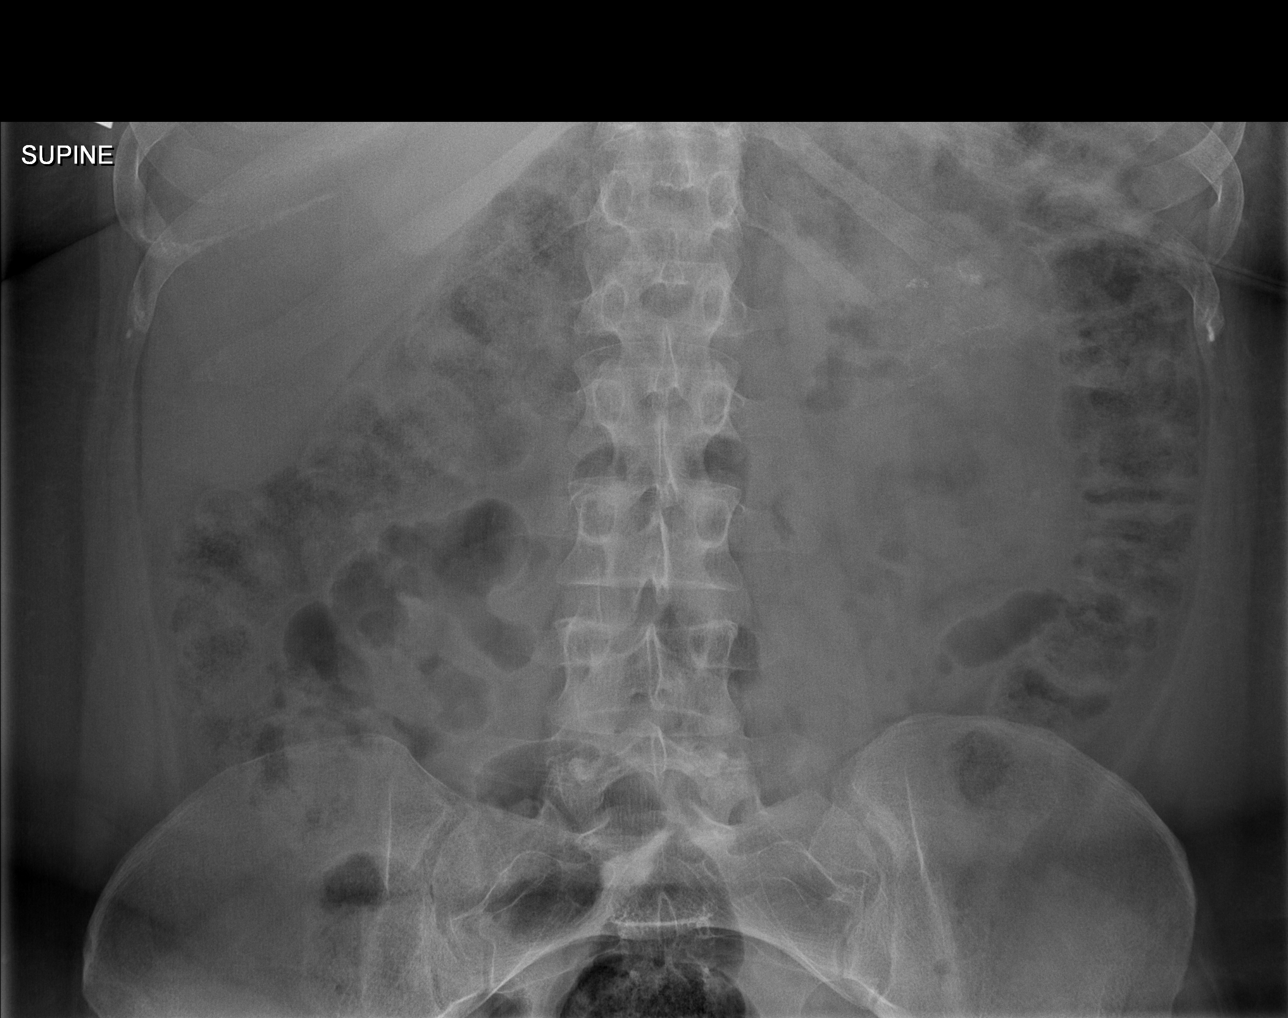

[4 of 4 positions shown; findings below may reference images not displayed]

FINDINGS: PA chest: The lungs are clear. The heart size and pulmonary
vascularity are normal. No adenopathy.

Supine and upright abdomen: Postoperative changes are noted in the
upper left abdomen. There is also a clip in the mid right pelvis.
There is moderate stool throughout colon. There is no bowel
dilatation or air-fluid level to suggest bowel obstruction. No free
air. There is a probable tiny phlebolith in the left pelvis.
IMPRESSION: Areas of postoperative change. Moderate stool throughout much of the
colon. No bowel obstruction or free air. Lungs clear.

## 2018-07-02 IMAGING — US US ABDOMEN LIMITED
1 series · 14 of 25 positions shown · non-contrast
Comparison: Ultrasound dated [DATE]

CLINICAL DATA: Progressive abdominal pain for 2 months.

EXAM:
ULTRASOUND ABDOMEN LIMITED RIGHT UPPER QUADRANT

[Series 1: us abdomen limited · 14 of 43 slices shown]
[im 1/43]
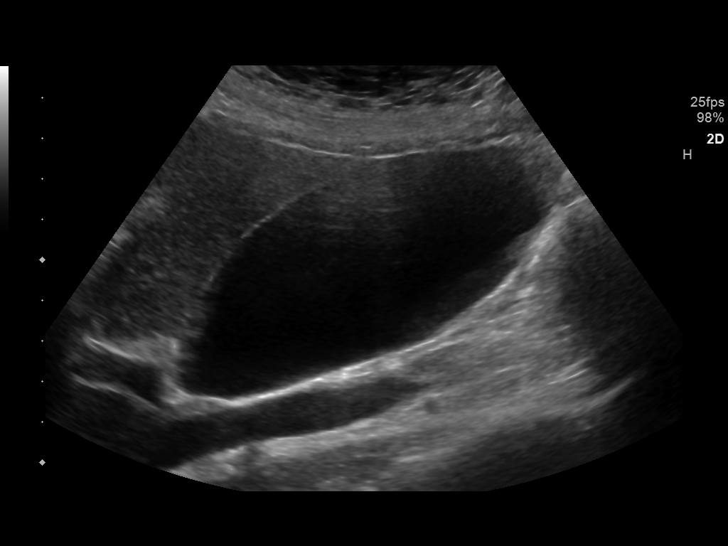
[im 4/43]
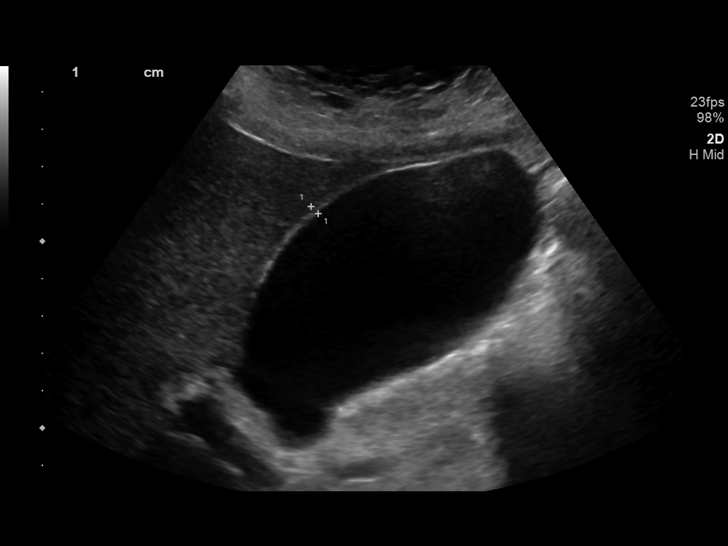
[im 8/43]
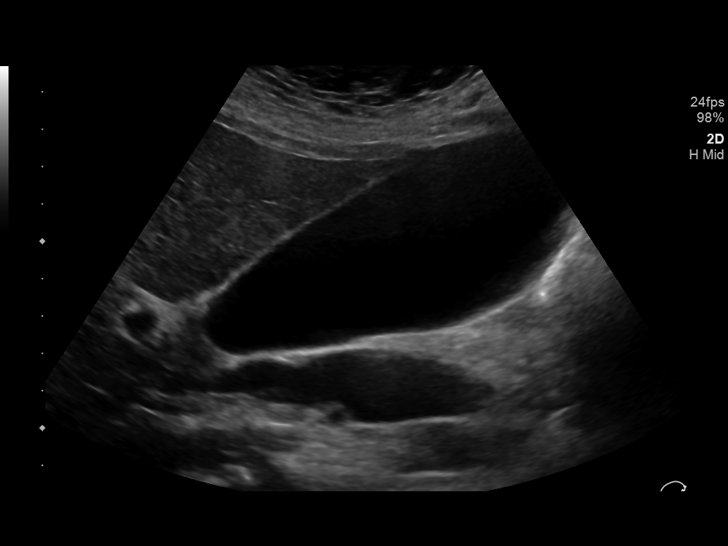
[im 11/43]
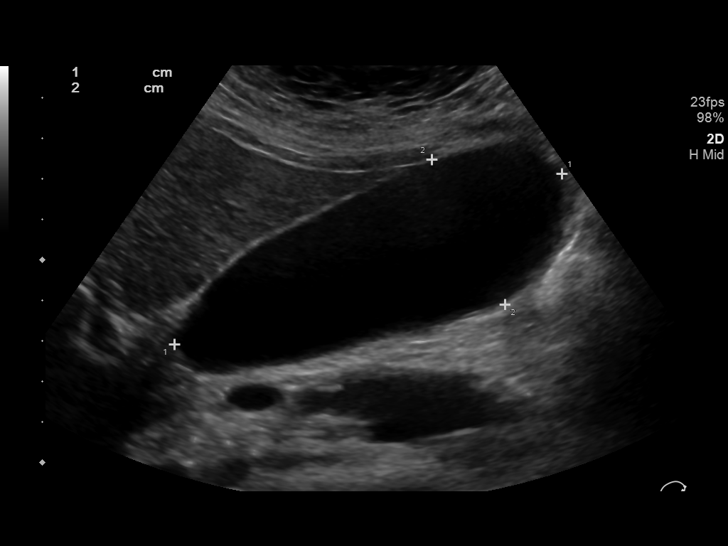
[im 15/43]
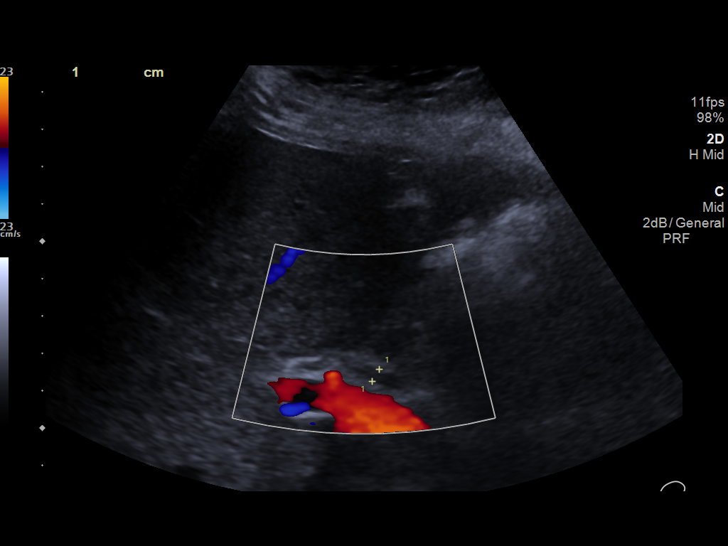
[im 16/43]
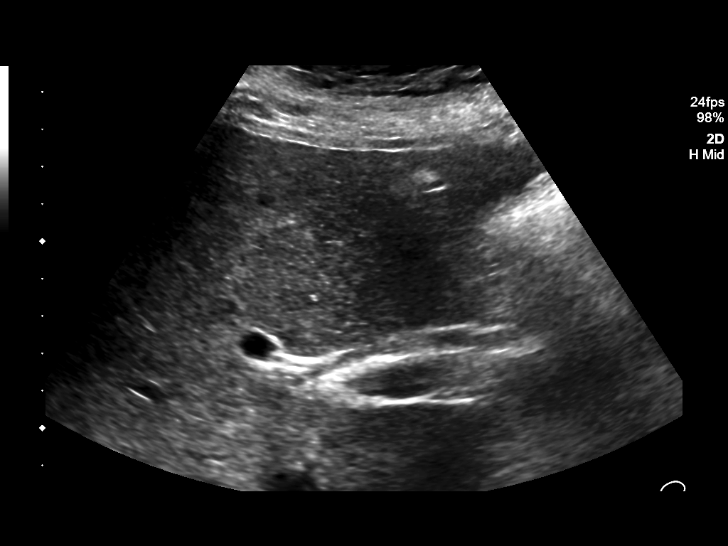
[im 20/43]
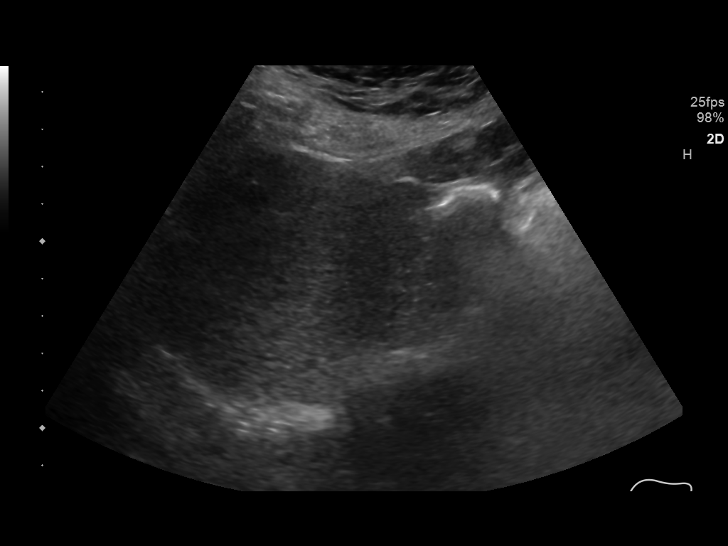
[im 23/43]
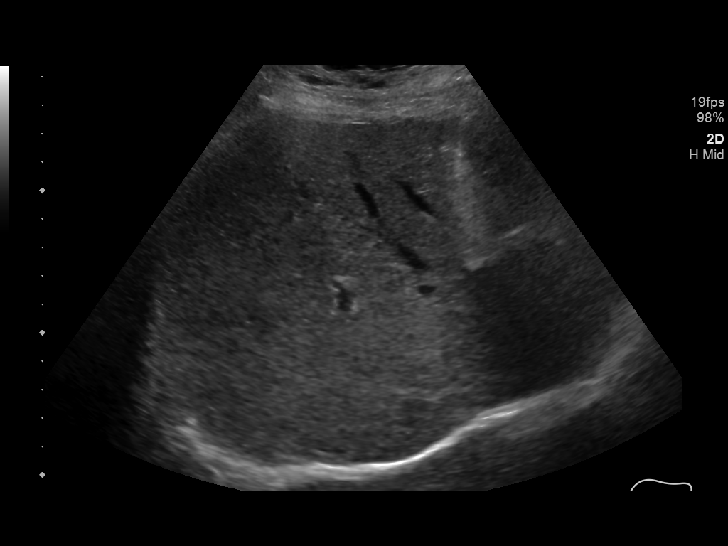
[im 27/43]
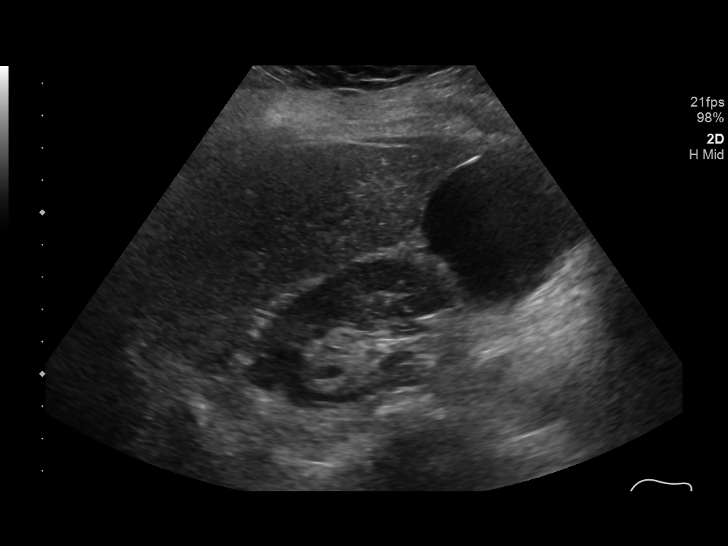
[im 29/43]
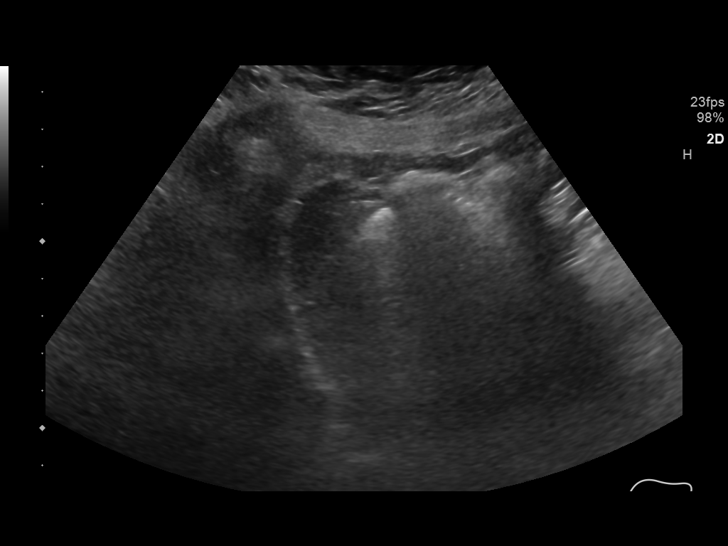
[im 32/43]
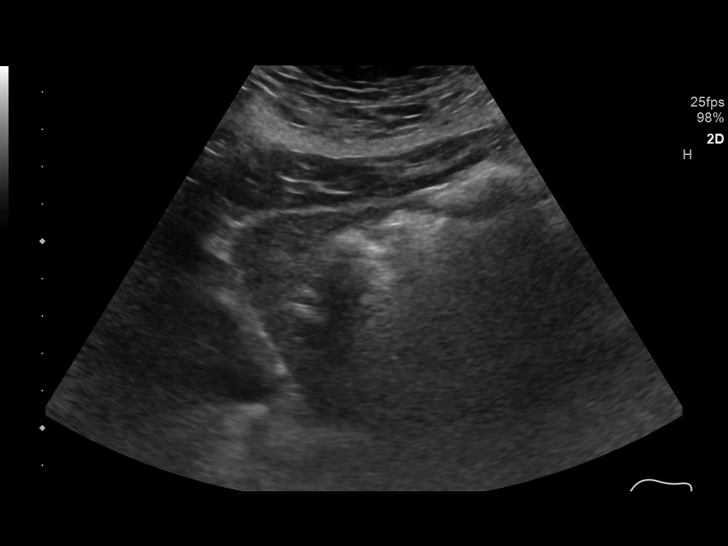
[im 36/43]
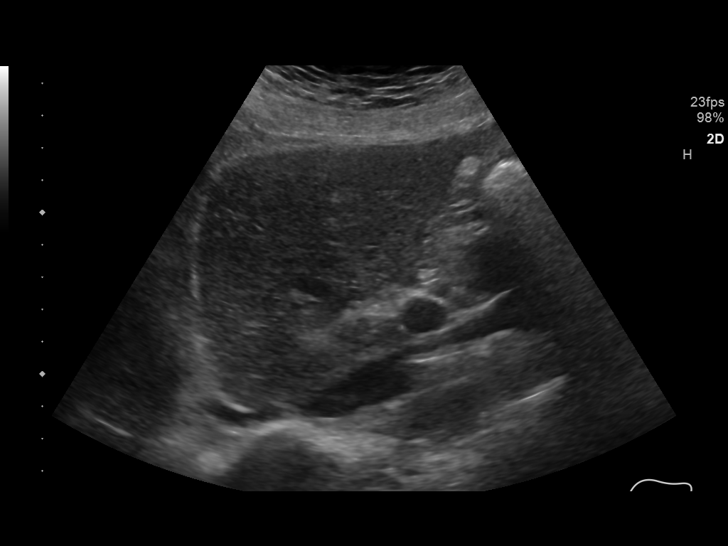
[im 39/43]
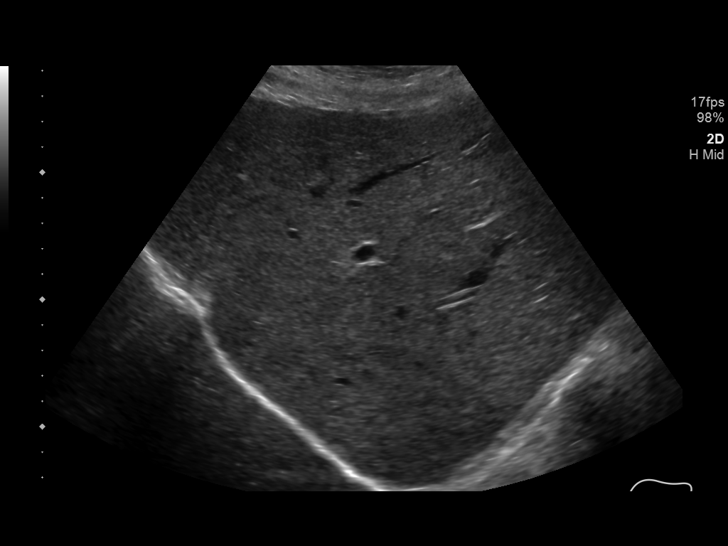
[im 43/43]
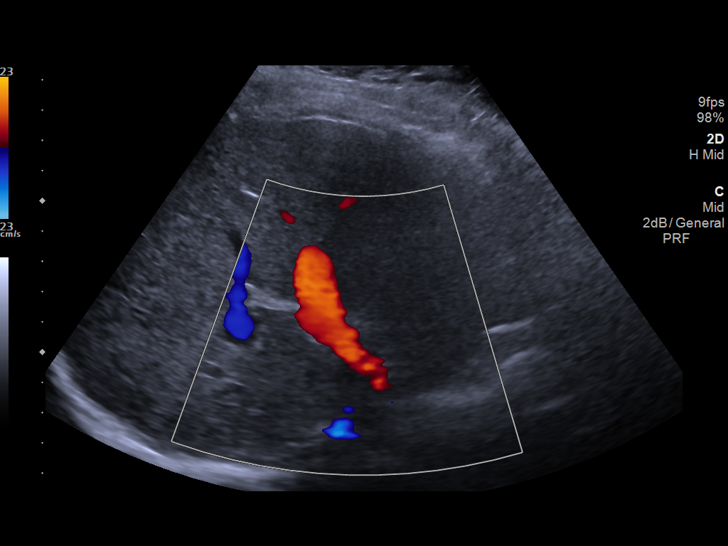

[14 of 25 positions shown; findings below may reference images not displayed]

FINDINGS: Gallbladder:

Gallbladder is distended but otherwise normal.  No wall thickening.

Common bile duct:

Diameter: 3.8 mm, normal.

Liver:

No focal lesion identified. Within normal limits in parenchymal
echogenicity. Portal vein is patent on color Doppler imaging with
normal direction of blood flow towards the liver.
IMPRESSION: Gallbladder appears distended but is otherwise normal. The remainder
of the exam is normal and unchanged.

## 2018-07-02 MED ORDER — SODIUM CHLORIDE 0.9% FLUSH
3.0000 mL | Freq: Once | INTRAVENOUS | Status: AC
  Administered 2018-07-02: 3 mL via INTRAVENOUS

## 2018-07-02 MED ORDER — METOCLOPRAMIDE HCL 5 MG/ML IJ SOLN
10.0000 mg | Freq: Once | INTRAMUSCULAR | Status: AC
  Administered 2018-07-02: 10 mg via INTRAVENOUS
  Filled 2018-07-02: qty 2

## 2018-07-02 MED ORDER — METOCLOPRAMIDE HCL 10 MG PO TABS: 10.0000 mg | ORAL_TABLET | Freq: Four times a day (QID) | ORAL | 0 refills | Status: DC | PRN

## 2018-07-02 MED ORDER — HYDROMORPHONE HCL 1 MG/ML IJ SOLN
1.0000 mg | Freq: Once | INTRAMUSCULAR | Status: AC
  Administered 2018-07-02: 1 mg via INTRAVENOUS
  Filled 2018-07-02: qty 1

## 2018-07-02 MED ORDER — HYDROMORPHONE HCL 1 MG/ML IJ SOLN
0.5000 mg | Freq: Once | INTRAMUSCULAR | Status: AC
  Administered 2018-07-02: 0.5 mg via INTRAVENOUS
  Filled 2018-07-02: qty 1

## 2018-07-02 MED ORDER — ONDANSETRON HCL 4 MG/2ML IJ SOLN
4.0000 mg | Freq: Once | INTRAMUSCULAR | Status: AC
  Administered 2018-07-02: 4 mg via INTRAVENOUS
  Filled 2018-07-02: qty 2

## 2018-07-02 MED ORDER — SODIUM CHLORIDE 0.9 % IV BOLUS
1000.0000 mL | Freq: Once | INTRAVENOUS | Status: AC
  Administered 2018-07-02: 1000 mL via INTRAVENOUS

## 2018-07-02 NOTE — Discharge Instructions (Signed)
If you develop worsening, continued, or recurrent abdominal pain, uncontrolled vomiting, fever, chest or back pain, or any other new/concerning symptoms then return to the ER for evaluation.  

## 2018-07-02 NOTE — ED Provider Notes (Signed)
[facility] DEPT Provider Note   CSN: [id number] Arrival date & time: 07/02/18  1007     History   Chief Complaint Chief Complaint  Patient presents with  . Abdominal Pain    HPI [name] [name] is a 33 y.o. female.  HPI  33 year old female presents with abdominal pain.  She states the pain has been ongoing since about November but much worse over the last 2 weeks.  Even worse today.  She has had a previous gastric bypass.  She has also had C-sections.  She states that she is been having intermittent vomiting and intermittent pain, mostly right upper quadrant for these last 2 weeks.  No urinary symptoms, vaginal bleeding, vaginal discharge.  There is also pain in the back.  No fevers.  She has taken ibuprofen, Tylenol and dicyclomine.  Pain is severe.  She has hemorrhoids and has chronic blood when she has a bowel movement but no new or worsening blood.  Past Medical History:  Diagnosis Date  . Anemia   . Anxiety   . DEPRESSION   . GERD (gastroesophageal reflux disease)   . Hypothyroidism    Hx of, normalized TSH during pregnancy 2011  . Infertility, female   . Migraines   . Morbid obesity ([place])    s/p RY 08/2012 - start weight 290 pounds  . Nephrolithiasis   . PCOS (polycystic ovarian syndrome)   . PONV (postoperative nausea and vomiting)   . Pregnancy induced hypertension     Patient Active Problem List   Diagnosis Date Noted  . Nephrolithiasis   . Grief 04/20/2018  . Bleeding diathesis ([place]) 10/13/2017  . Depressive disorder 07/09/2017  . Dysmenorrhea 07/09/2017  . Menorrhagia 07/09/2017  . Reduced libido 07/09/2017  . History of cesarean section 07/09/2017  . Binge eating disorder 04/23/2017  . Morbid obesity ([place]) 04/23/2017  . 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 Pes planus 07/16/2016  . Hallux varus, acquired, right 03/18/2016  . Sleep disturbance 05/28/2015  . Iron deficiency anemia 02/22/2015  . Kidney stone  08/24/2013  . 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 Anxiety 04/10/2010    Past Surgical History:  Procedure Laterality Date  . Barbie Banner OSTEOTOMY Left 06/24/2017   Procedure: [employer];  Surgeon: Trula Slade, DPM;  Location: [facility];  Service: Podiatry;  Laterality: Left;  . BUNIONECTOMY Left 06/24/2017   Procedure: Annye English;  Surgeon: Trula Slade, DPM;  Location: [facility];  Service: Podiatry;  Laterality: Left;  . CESAREAN SECTION     x 2  . FOOT SURGERY    . 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   Procedure: LAPAROSCOPIC ROUX-EN-Y GASTRIC;  Surgeon: Gayland Curry, MD;  Location: [facility];  Service: General;  Laterality: N/A;  laparoscopic roux-en-y gastric bypass  . LITHOTRIPSY Left   . TONSILLECTOMY    . TUBAL LIGATION    . UPPER GI ENDOSCOPY  08/24/2012   Procedure: UPPER GI ENDOSCOPY;  Surgeon: Gayland Curry, MD;  Location: [facility];  Service: General;;  . WISDOM TOOTH EXTRACTION       OB History    Gravida  2   Para  2   Term  2   Preterm  0   AB  0   Living  2     SAB  0   TAB  0   Ectopic  0   Multiple  0   Live Births  2  Home Medications    Prior to Admission medications   Medication Sig Start Date End Date Taking? Authorizing Provider  acetaminophen (TYLENOL) 500 MG tablet Take 1,000 mg by mouth every 6 (six) hours as needed for moderate pain.    Yes [provider]  CALCIUM-MAGNESIUM-ZINC PO Take 3 tablets by mouth daily.   Yes [provider]  diclofenac sodium (VOLTAREN) 1 % GEL Apply topically to affected area qid Patient taking differently: Apply 2 g topically 4 (four) times daily as needed (pain). Apply topically to affected area qid prn for pain 12/29/17  Yes Gerda Diss, DO  dicyclomine (BENTYL) 20 MG tablet Take 1 tablet (20 mg total) by mouth 2 (two) times daily as needed for spasms. 06/04/18  Yes Julianne Rice, MD  escitalopram (LEXAPRO)  20 MG tablet Take 1 tablet (20 mg total) by mouth daily. 04/20/18  Yes Briscoe Deutscher, DO  gabapentin (NEURONTIN) 100 MG capsule 132m in the morning and 200 mg at night 01/29/18  Yes WBriscoe Deutscher DO  Lisdexamfetamine Dimesylate (VYVANSE) 60 MG CHEW Chew 1 tablet by mouth daily. 06/20/18 07/19/18 Yes WBriscoe Deutscher DO  LORazepam (ATIVAN) 1 MG tablet TAKE 1 TABLET BY MOUTH TWO TIMES DAILY AS NEEDED FOR ANXIETY Patient taking differently: Take 1 mg by mouth 2 (two) times daily as needed for anxiety.  06/30/18  Yes WBriscoe Deutscher DO  Multiple Vitamins-Minerals (MULTIVITAMIN ADULT EXTRA C PO) Take 1 tablet by mouth daily.    Yes [provider]  ondansetron (ZOFRAN ODT) 4 MG disintegrating tablet Take 1 tablet (4 mg total) by mouth every 8 (eight) hours as needed for nausea or vomiting. 04/19/18  Yes GKinnie Feil PA-C  hydrocortisone (ANUSOL-HC) 25 MG suppository Place 1 suppository (25 mg total) rectally 2 (two) times daily. Patient not taking: Reported on 07/02/2018 06/01/18   WBriscoe Deutscher DO  Lisdexamfetamine Dimesylate (VYVANSE) 60 MG CHEW Chew 1 tablet by mouth daily. Patient not taking: Reported on 07/02/2018 05/20/18 06/19/18  WBriscoe Deutscher DO  metoCLOPramide (REGLAN) 10 MG tablet Take 1 tablet (10 mg total) by mouth every 6 (six) hours as needed for nausea (nausea/headache). 07/02/18   GSherwood Gambler MD  tamsulosin (FLOMAX) 0.4 MG CAPS capsule Take 1 capsule (0.4 mg total) by mouth daily. Patient not taking: Reported on 06/04/2018 04/20/18   WBriscoe Deutscher DO    Family History Family History  Problem Relation Age of Onset  . Hyperlipidemia Father   . Hypertension Father   . Kidney disease Father   . Colon cancer Father 58 . Hypertension Mother   . Hypertension Maternal Grandmother   . Uterine cancer Maternal Grandmother 76  . Diabetes Maternal Grandfather     Social History Social History   Tobacco Use  . Smoking status: Never Smoker  . Smokeless tobacco: Never  Used  Substance Use Topics  . Alcohol use: No    Comment: rare/socially  . Drug use: No     Allergies   Nsaids   Review of Systems Review of Systems  Constitutional: Negative for fever.  Cardiovascular: Negative for chest pain.  Gastrointestinal: Positive for abdominal pain, blood in stool, nausea and vomiting.  Genitourinary: Negative for dysuria, hematuria, vaginal bleeding and vaginal discharge.  Musculoskeletal: Positive for back pain.  All other systems reviewed and are negative.    Physical Exam Updated Vital Signs BP 126/79   Pulse 87   Temp 98.5 F (36.9 C) (Oral)   Resp 18   Ht _0  (1.626 m)  Wt 101.2 kg   SpO2 95%   BMI 38.28 kg/m   Physical Exam Vitals signs and nursing note reviewed.  Constitutional:      General: She is not in acute distress.    Appearance: She is well-developed. She is obese. She is not ill-appearing or diaphoretic.  HENT:     Head: Normocephalic and atraumatic.     Right Ear: External ear normal.     Left Ear: External ear normal.     Nose: Nose normal.  Eyes:     General:        Right eye: No discharge.        Left eye: No discharge.  Cardiovascular:     Rate and Rhythm: Normal rate and regular rhythm.     Heart sounds: Normal heart sounds.     Comments: HR high 90s Pulmonary:     Effort: Pulmonary effort is normal.     Breath sounds: Normal breath sounds.  Abdominal:     Palpations: Abdomen is soft.     Tenderness: There is abdominal tenderness in the right upper quadrant and left upper quadrant. There is right CVA tenderness. There is no left CVA tenderness.  Skin:    General: Skin is warm and dry.  Neurological:     Mental Status: She is alert.  Psychiatric:        Mood and Affect: Mood is not anxious.      ED Treatments / Results  Labs (all labs ordered are listed, but only abnormal results are displayed) Labs Reviewed  COMPREHENSIVE METABOLIC PANEL - Abnormal; Notable for the following components:       Result Value   Calcium 8.5 (*)    Alkaline Phosphatase 153 (*)    All other components within normal limits  CBC - Abnormal; Notable for the following components:   Hemoglobin 11.1 (*)    HCT 35.5 (*)    MCH 25.9 (*)    All other components within normal limits  URINALYSIS, ROUTINE W REFLEX MICROSCOPIC - Abnormal; Notable for the following components:   Leukocytes, UA TRACE (*)    Bacteria, UA RARE (*)    All other components within normal limits  LIPASE, BLOOD  PREGNANCY, URINE    EKG None  Radiology Dg Acute Abd 2+v Abd (supine,erect,decub) + 1v Chest  Result Date: 07/02/2018 CLINICAL DATA:  Abdominal pain nausea and vomiting EXAM: DG ABDOMEN ACUTE W/ 1V CHEST COMPARISON:  Chest radiograph May 31, 2017; CT abdomen and pelvis April 29, 2018. FINDINGS: PA chest: The lungs are clear. The heart size and pulmonary vascularity are normal. No adenopathy. Supine and upright abdomen: Postoperative changes are noted in the upper left abdomen. There is also a clip in the mid right pelvis. There is moderate stool throughout colon. There is no bowel dilatation or air-fluid level to suggest bowel obstruction. No free air. There is a probable tiny phlebolith in the left pelvis. IMPRESSION: Areas of postoperative change. Moderate stool throughout much of the colon. No bowel obstruction or free air. Lungs clear. Electronically Signed   By: Lowella Grip III M.D.   On: 07/02/2018 14:22   US Abdomen Limited Ruq  Result Date: 07/02/2018 CLINICAL DATA:  Progressive abdominal pain for 2 months. EXAM: ULTRASOUND ABDOMEN LIMITED RIGHT UPPER QUADRANT COMPARISON:  Ultrasound dated 06/04/2018 FINDINGS: Gallbladder: Gallbladder is distended but otherwise normal.  No wall thickening. Common bile duct: Diameter: 3.8 mm, normal. Liver: No focal lesion identified. Within normal limits in parenchymal echogenicity. Portal  vein is patent on color Doppler imaging with normal direction of blood flow towards the  liver. IMPRESSION: Gallbladder appears distended but is otherwise normal. The remainder of the exam is normal and unchanged. Electronically Signed   By: Lorriane Shire M.D.   On: 07/02/2018 13:06    Procedures Procedures (including critical care time)  Medications Ordered in ED Medications  sodium chloride flush (NS) 0.9 % injection 3 mL (3 mLs Intravenous Given 07/02/18 1144)  HYDROmorphone (DILAUDID) injection 1 mg (1 mg Intravenous Given 07/02/18 1143)  sodium chloride 0.9 % bolus 1,000 mL (0 mLs Intravenous Stopped 07/02/18 1244)  ondansetron (ZOFRAN) injection 4 mg (4 mg Intravenous Given 07/02/18 1142)  HYDROmorphone (DILAUDID) injection 0.5 mg (0.5 mg Intravenous Given 07/02/18 1438)  metoCLOPramide (REGLAN) injection 10 mg (10 mg Intravenous Given 07/02/18 1438)     Initial Impression / Assessment and Plan / ED Course  I have reviewed the triage vital signs and the nursing notes.  Pertinent labs & imaging results that were available during my care of the patient were reviewed by me and considered in my medical decision making (see chart for details).     Patient's pain is mostly localized to the right upper quadrant.  There is no right lower quadrant or lower abdominal tenderness.  Her labs are overall reassuring and at baseline.  While she does have a mildly elevated alk phos, this is stable to previous measurements.  Urinalysis is clear.  She is been having this pain more significantly for 2 weeks but overall for quite some time.  She has had a CT stone study around the time this began.  I do not think emergent CT imaging would be needed given no focal findings otherwise and the length of symptoms.  I think she needs to follow-up with her GI specialist, [facility].  She appears stable for outpatient management with return precautions.  Final Clinical Impressions(s) / ED Diagnoses   Final diagnoses:  Abdominal pain, RUQ (right upper quadrant)    ED Discharge Orders           Ordered    metoCLOPramide (REGLAN) 10 MG tablet  Every 6 hours PRN     07/02/18 1512           Sherwood Gambler, MD 07/02/18 1611

## 2018-07-02 NOTE — ED Triage Notes (Signed)
Pt presents with c/o abdominal pain that started several days ago. Pt reports that the pain is in the lower right abdomen. Pt reports n/v, denies diarrhea.

## 2018-07-06 ENCOUNTER — Ambulatory Visit (INDEPENDENT_AMBULATORY_CARE_PROVIDER_SITE_OTHER): Payer: No Typology Code available for payment source | Admitting: Gastroenterology

## 2018-07-06 ENCOUNTER — Encounter: Payer: Self-pay | Admitting: Gastroenterology

## 2018-07-06 VITALS — BP 112/84 | HR 80 | Ht 64.0 in | Wt 236.0 lb

## 2018-07-06 DIAGNOSIS — R1011 Right upper quadrant pain: Secondary | ICD-10-CM

## 2018-07-06 DIAGNOSIS — K5900 Constipation, unspecified: Secondary | ICD-10-CM | POA: Diagnosis not present

## 2018-07-06 DIAGNOSIS — K625 Hemorrhage of anus and rectum: Secondary | ICD-10-CM

## 2018-07-06 DIAGNOSIS — Z800 Family history of malignant neoplasm of digestive organs: Secondary | ICD-10-CM

## 2018-07-06 MED ORDER — LINACLOTIDE 72 MCG PO CAPS: 72.0000 ug | ORAL_CAPSULE | Freq: Every day | ORAL | 5 refills | Status: DC

## 2018-07-06 MED ORDER — PEG 3350-KCL-NA BICARB-NACL 420 G PO SOLR: 4000.0000 mL | ORAL | 0 refills | Status: DC

## 2018-07-06 MED FILL — METOCLOPRAMIDE 10 MG TABLET: 10 | 2 days supply | Qty: 10 | Fill #0

## 2018-07-06 NOTE — Patient Instructions (Addendum)
You will be set up for a colonoscopy. You will be set up for an upper endoscopy. For contipation, right sided abd pains, rectal bleeding, FH of colon cancer.   Also linzess trial pill, one pill once daily, disp 30 with 5 refills.  Thank you for entrusting me with your care and choosing [facility].  Dr Christella Hartigan

## 2018-07-06 NOTE — Progress Notes (Signed)
HPI: This is a very pleasant 33 year old woman   who was referred to me by Helane RimaWallace, Erica, DO  to evaluate intermittent abdominal pains, nausea, rectal bleeding, constipation.    Chief complaint is intermittent abdominal pains, nausea, rectal bleeding, constipation  Has had kidney stones and she felt that a lot of her symptoms since July were related however she had a stone CT scan November 2019 and it was negative for urologic issues.   She has had right flank pains radiating to her RUQ.  They are constant for 3-4 days, then recurs periodically.  Will have terrible nausea during these episodes.    This occurred in July and she went to urgent care.  Have been going on about every two weeks since.  During the episodes she has no uriinary issues.  Went to ER and the blood tests and imaging studies below were done.  Tends to be constipated.  2-3 times in a week and with pushing, straining. Will usually see blood in the water.  Seeing blood, small bits since 2013.  Feels a hemorroid, since child was born in 2011.  n  Her father died of colon cancer; diagnosed at age 35.  She's never had a colonoscopy.  She had Roux en y g bypass in 2014.    Has gained 15 pounds in the past year.   Old Data Reviewed:  Lab work January 2020: Normal pancreatic enzymes, normal complete metabolic profile except for slightly elevated alkaline phosphatase, normal CBC except for hemoglobin 11.1 (normocytic), urinalysis showed trace leukocytes and rare bacteria, urine pregnancy test was negative  Imaging January 2020: Right upper quadrant ultrasound shows distended gallbladder but otherwise was normal.  Acute abdominal series showed moderate stool throughout the colon but was otherwise essentially normal     Review of systems: Pertinent positive and negative review of systems were noted in the above HPI section. All other review negative.   Past Medical History:  Diagnosis Date  . Anemia   . Anxiety    . DEPRESSION   . GERD (gastroesophageal reflux disease)   . Hypothyroidism    Hx of, normalized TSH during pregnancy 2011  . Infertility, female   . Migraines   . Morbid obesity ([facility])    s/p RY 08/2012 - start weight 290 pounds  . Nephrolithiasis   . PCOS (polycystic ovarian syndrome)   . PONV (postoperative nausea and vomiting)   . Pregnancy induced hypertension     Past Surgical History:  Procedure Laterality Date  . Quintella ReichertIKEN OSTEOTOMY Left 06/24/2017   Procedure: Ralene BatheAIKEN OSTEOTOMY;  Surgeon: Vivi BarrackWagoner, Matthew R, DPM;  Location: [facility];  Service: Podiatry;  Laterality: Left;  . BUNIONECTOMY Left 06/24/2017   Procedure: Anthoney HaradaALSTON BUNIONECTOMY;  Surgeon: Vivi BarrackWagoner, Matthew R, DPM;  Location: [facility];  Service: Podiatry;  Laterality: Left;  . CESAREAN SECTION     x 2  . FOOT SURGERY    . 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   Procedure: LAPAROSCOPIC ROUX-EN-Y GASTRIC;  Surgeon: Atilano InaEric M Wilson, MD;  Location: [facility];  Service: General;  Laterality: N/A;  laparoscopic roux-en-y gastric bypass  . LITHOTRIPSY Left   . TONSILLECTOMY    . TUBAL LIGATION    . UPPER GI ENDOSCOPY  08/24/2012   Procedure: UPPER GI ENDOSCOPY;  Surgeon: Atilano InaEric M Wilson, MD;  Location: [facility];  Service: General;;  . WISDOM TOOTH EXTRACTION      Current Outpatient Medications  Medication Sig Dispense Refill  . acetaminophen (TYLENOL) 500  MG tablet Take 1,000 mg by mouth every 6 (six) hours as needed for moderate pain.     Marland Kitchen. CALCIUM-MAGNESIUM-ZINC PO Take 3 tablets by mouth daily.    . diclofenac sodium (VOLTAREN) 1 % GEL Apply topically to affected area qid (Patient taking differently: Apply 2 g topically 4 (four) times daily as needed (pain). Apply topically to affected area qid prn for pain) 100 g 1  . dicyclomine (BENTYL) 20 MG tablet Take 1 tablet (20 mg total) by mouth 2 (two) times daily as needed for spasms. 30 tablet 0  . escitalopram (LEXAPRO) 20 MG tablet Take 1 tablet (20 mg total) by  mouth daily. 90 tablet 2  . gabapentin (NEURONTIN) 100 MG capsule 100mg  in the morning and 200 mg at night 90 capsule 2  . hydrocortisone (ANUSOL-HC) 25 MG suppository Place 1 suppository (25 mg total) rectally 2 (two) times daily. 12 suppository 0  . Lisdexamfetamine Dimesylate (VYVANSE) 60 MG CHEW Chew 1 tablet by mouth daily. 30 tablet 0  . LORazepam (ATIVAN) 1 MG tablet TAKE 1 TABLET BY MOUTH TWO TIMES DAILY AS NEEDED FOR ANXIETY (Patient taking differently: Take 1 mg by mouth 2 (two) times daily as needed for anxiety. ) 60 tablet 0  . metoCLOPramide (REGLAN) 10 MG tablet Take 1 tablet (10 mg total) by mouth every 6 (six) hours as needed for nausea (nausea/headache). 10 tablet 0  . Multiple Vitamins-Minerals (MULTIVITAMIN ADULT EXTRA C PO) Take 1 tablet by mouth daily.     . ondansetron (ZOFRAN ODT) 4 MG disintegrating tablet Take 1 tablet (4 mg total) by mouth every 8 (eight) hours as needed for nausea or vomiting. 20 tablet 0   No current facility-administered medications for this visit.     Allergies as of 07/06/2018 - Review Complete 07/06/2018  Allergen Reaction Noted  . Nsaids  04/29/2018    Family History  Problem Relation Age of Onset  . Hyperlipidemia Father   . Hypertension Father   . Kidney disease Father   . Colon cancer Father [id number]  . Hypertension Mother   . Hypertension Maternal Grandmother   . Uterine cancer Maternal Grandmother [id number]  . Diabetes Maternal Grandfather     Social History   Socioeconomic History  . Marital status: Married    Spouse name: Not on file  . Number of children: Not on file  . Years of education: Not on file  . Highest education level: Not on file  Occupational History  . Not on file  Social Needs  . Financial resource strain: Not on file  . Food insecurity:    Worry: Not on file    Inability: Not on file  . Transportation needs:    Medical: Not on file    Non-medical: Not on file  Tobacco Use  . Smoking status: Never Smoker  .  Smokeless tobacco: Never Used  Substance and Sexual Activity  . Alcohol use: No    Comment: rare/socially  . Drug use: No  . Sexual activity: Yes    Birth control/protection: Other-see comments    Comment: tubal ligation  Lifestyle  . Physical activity:    Days per week: Not on file    Minutes per session: Not on file  . Stress: Not on file  Relationships  . Social connections:    Talks on phone: Not on file    Gets together: Not on file    Attends religious service: Not on file    Active member of club or  organization: Not on file    Attends meetings of clubs or organizations: Not on file    Relationship status: Not on file  . Intimate partner violence:    Fear of current or ex partner: Not on file    Emotionally abused: Not on file    Physically abused: Not on file    Forced sexual activity: Not on file  Other Topics Concern  . Not on file  Social History Narrative   Married, lives with spouse and dtr born 07/2009. Employed as Associate Professor at [employer].     Physical Exam: BP 112/84   Pulse 80   Ht 5\' 4"  (1.626 m)   Wt 236 lb (107 kg)   BMI 40.51 kg/m  Constitutional: generally well-appearing Psychiatric: alert and oriented x3 Eyes: extraocular movements intact Mouth: oral pharynx moist, no lesions Neck: supple no lymphadenopathy Cardiovascular: heart regular rate and rhythm Lungs: clear to auscultation bilaterally Abdomen: soft, nontender, nondistended, no obvious ascites, no peritoneal signs, normal bowel sounds Extremities: no lower extremity edema bilaterally Skin: no lesions on visible extremities Rectal exam deferred for upcoming colonoscopy  Assessment and plan: 33 y.o. female with chronic intermittent right-sided abdominal pains, nausea, constipation, rectal bleeding, family history of colon cancer  I have a low suspicion for underlying neoplasm since she has been gaining weight and her rectal bleeding has been going on for about 7 years but given her family  history and her issues I think she should certainly have a colonoscopy at her soonest convenience.  I am suspicious that chronic constipation may be the root of her problems.  I am going to try her on Linzess low-dose.  She says MiraLAX causes nausea and a one-month trial of daily powder fiber supplement in the past was not helpful at all.  At the same time as her colonoscopy proceed with EGD to check for possible gastritis, H. pylori infection.    Please see the "Patient Instructions" section for addition details about the plan.   Rob Bunting, MD [facility] 07/06/2018, 8:29 AM  Cc: Helane Rima, DO

## 2018-07-09 ENCOUNTER — Encounter: Payer: Self-pay | Admitting: Physician Assistant

## 2018-07-09 ENCOUNTER — Ambulatory Visit (INDEPENDENT_AMBULATORY_CARE_PROVIDER_SITE_OTHER): Payer: Self-pay | Admitting: Physician Assistant

## 2018-07-09 VITALS — BP 114/80 | HR 104 | Temp 98.5°F | Resp 16 | Ht 64.0 in | Wt 238.0 lb

## 2018-07-09 DIAGNOSIS — K146 Glossodynia: Secondary | ICD-10-CM

## 2018-07-09 MED ORDER — MAGIC MOUTHWASH W/LIDOCAINE: 5.0000 mL | Freq: Four times a day (QID) | ORAL | 0 refills | Status: AC

## 2018-07-09 MED ORDER — SUCRALFATE 1 GM/10ML PO SUSP: 1.0000 g | Freq: Three times a day (TID) | ORAL | 0 refills | Status: DC

## 2018-07-09 MED FILL — PEG-3350 SOLUTION: 420 | 1 days supply | Qty: 4000 | Fill #0

## 2018-07-09 MED FILL — LINZESS 72 MCG CAPSULE: 72 | 30 days supply | Qty: 30 | Fill #0

## 2018-07-09 MED FILL — MAGIC MTHWASH W/LIDOCAINE: 7 days supply | Qty: 140 | Fill #0

## 2018-07-09 MED FILL — CARAFATE 1 GM/10 ML SUSP: 1 | 10 days supply | Qty: 420 | Fill #0

## 2018-07-09 NOTE — Patient Instructions (Signed)
Thank you for choosing [facility] for your health care needs.  You have been diagnosed with tongue pain.  Will treat symptoms; with Miracle Mouthwash and Carafate suspension.  Recommend you eat a bland diet; mashed potatoes, scrambled eggs, yogurt. Take over the counter Tylenol for pain.  Recommend you follow-up with your family physician for further evaluation and treatment. May be due to vitamin deficiency; may wish to recheck iron, VitB12, folic acid, etc. As these have been low in the past.  Hope you feel better soon!  Glossitis Glossitis is inflammation of the tongue. This may be a stand-alone condition, or it may be a symptom of a different condition that you have. Generally, glossitis goes away when its cause is identified and treated. Glossitis can be dangerous if it causes difficulty breathing. What are the causes? This condition may be caused by many different things. Common causes include:  Viral, bacterial, or yeast infections.  Allergies.  Disorders that affect the skin and mucous membranes, such as certain autoimmune disorders.  Abnormal tissue growths (tumors).  Lack of healthy red blood cells (anemia).  Movement of stomach acid into the muscular tube that connects the mouth and the stomach (gastroesophageal reflux).  Lack of proper nutrition or certain vitamins.  Certain lifelong (chronic) medical conditions, such as diabetes. Sometimes, glossitis may not be caused by an underlying condition. In these cases, glossitis may be caused by:  Use of tobacco products, such as cigarettes, chewing tobacco, or e-cigarettes.  Excessive alcohol use.  Tongue injury or irritation.  Certain medicines, such as medicines to treat cancer. In some cases, the cause is not known. What increases the risk? The following factors may make you more likely to develop this condition:  Being a man.  Taking antibiotics or steroids, such as asthma medicines.  Drinking alcohol  excessively.  Using tobacco products, such as cigarettes, chewing tobacco, or e-cigarettes.  Having a chronic medical condition, such as an immune disease or cancer.  Not brushing or flossing your teeth regularly.  Being anemic or not getting proper nutrition. What are the signs or symptoms? Symptoms of this condition vary depending on the cause. Symptoms may include:  Swelling of the tongue.  Pain and tenderness in the tongue. Sometimes, this condition is painless.  Changes in tongue color. The tongue may be pale or bright red.  Smooth areas on the tongue's surface.  A small mass of tissue (node) or white patch on the tongue.  Difficulty chewing, swallowing, or talking.  Difficulty breathing. How is this diagnosed? This condition is diagnosed based on a physical exam and medical history. Your health care provider may ask you about your eating and drinking habits. You may also have tests, including:  Blood tests.  Removal of a small amount of cells from the tongue that are examined under a microscope (biopsy). You may be given the name of a dentist or a health care provider who specializes in ear, nose, and throat (ENT) problems (otolaryngologist). How is this treated? Treatment for this condition depends on the cause and may include:  Following instructions from your health care provider about keeping your mouth clean and avoiding irritants that may have caused your condition or made it worse.  Nutritional therapy. Your health care provider may tell you to change your eating and drinking habits or take a nutritional supplement.  Managing underlying conditions that may have caused your glossitis.  Medicines, such as: ? Corticosteroids to reduce inflammation. ? Antibiotics if your condition was caused by an  infection. ? Medicines that numb your tongue or mouth (local anesthetics). Follow these instructions at home: Eating and drinking   Eat healthy foods. Follow  instructions from your health care provider about eating or drinking restrictions.  If you drink alcohol, limit how much you have: ? 0-1 drink a day for women. ? 0-2 drinks a day for men.  Be aware of how much alcohol is in your drink. In the [place] one drink equals one 12 oz bottle of beer (355 mL), one 5 oz glass of wine (148 mL), or one 1 oz glass of hard liquor (44 mL). Mouth care   Keep your teeth and mouth clean. This includes brushing and flossing frequently and having regular dental checkups.  If you wear dentures or dental braces, work with your dentist to make sure they fit correctly.  Follow other instructions from your health care provider about how to take care of your mouth. He or she may recommend that you: ? Gently brush your tongue. ? Gargle with a salt-water mixture 3-4 times a day or as needed. To make a salt-water mixture, completely dissolve -1 tsp of salt in 1 cup of warm water. ? Avoid any irritants that may have caused your condition or made it worse, such as chemicals or certain foods. ? Avoid breath mints, antibacterial mouthwash, and chewing gum. General instructions   Do not use any products that contain nicotine or tobacco, such as cigarettes and e-cigarettes. If you need help quitting, ask your health care provider.  Take over-the-counter and prescription medicines only as told by your health care provider.  Take supplements only as told by your health care provider. Follow the directions carefully.  Keep all follow-up visits as told by your health care provider. This is important. Contact a health care provider if:  You have a fever.  You develop new symptoms.  You have symptoms that do not get better with medicine or get worse.  You have symptoms that do not go away after 10 days.  You cannot eat or drink because of your pain. Get help right away if:  You have severe pain or swelling.  You have difficulty breathing, swallowing, or  talking. Summary  Glossitis is inflammation of the tongue. It can be caused by many different things.  Glossitis generally goes away when its cause is identified and treated.  Keep your teeth and mouth clean. This includes brushing and flossing frequently and having regular dental checkups.  Eat healthy foods. Follow instructions from your health care provider about eating or drinking restrictions. This information is not intended to replace advice given to you by your health care provider. Make sure you discuss any questions you have with your health care provider. Document Released: 05/23/2002 Document Revised: 09/28/2017 Document Reviewed: 09/28/2017 Elsevier Interactive Patient Education  [employer].

## 2018-07-09 NOTE — Progress Notes (Signed)
Patient ID: [name] DixonSamantha D Mittman DOB: [birth year] AGE: 33 y.o. MRN: [id number]   PCP: Helane RimaWallace, Erica, DO   Chief Complaint:  Chief Complaint  Patient presents with  . tongue pain    x 2d     Subjective:    HPI:  [name] [name] is a 33 y.o. female presents for evaluation  Chief Complaint  Patient presents with  . tongue pain    x 722d   33 year old female [facility] to [facility] with two day history of sore tongue. Describes as burning sensation. Constant. Aggravated with gingerale and with corn chips; ate this morning. Patient looked at tongue today; saw bumps along center portion. Has taken over the counter Prilosec with no symptom improvement. Denies tongue injury/trauma. Denies known tongue burn; such as by drinking hot beverage. Denies previous history of similar symptoms. Associated mild nasal congestion, PND, sore throat, and cough - began yesterday. Denies gum pain, bleeding or purulent drainage. Denies fever, chills, headache, ear pain, sinus pain, chest pain, SOB, wheezing, vomiting (has nausea, persistent for months).  [facility]2018 for three month history of RUQ abominal pain, worse over past two weeks. Labs reassuring. Urinalysis clear. RUQ ultrasound revealed distended gallbadder, otherwise normal. Abdominal KUB x-ray revealed moderate stool, otherwise normal. Advised patient follow-up with gastroenterologist, [facility]. Seen by Dr. Rob Buntinganiel Jacobs MD on 07/06/2018 for abdominal pain, nausea, rectal bleeding, and constipation. Suspected chronic constipation cause of symptoms. Prescribed Linzess. Patient to be scheduled for EGD and colonoscopy for further evaluation.  Patient is status post Roux-en-Y gastric bypass in 2014. Father died of colon cancer, diagnosed age 35. Patient with no previous history of colonoscopy. Patient with previous history of ureteral stones; 2012 (required lithotripsy) and 2019  (passed on it's own, CT scan at ED revealed hydronephroris with no visible stone, suspected stone recently passed).  Patient's nutritional status in regards to Roux-en-Y gastric bypass managed by PCP, Dr. Helane RimaErica Wallace DO with [facility]. Last seen 06/01/2018, primarily for anxiety/depression issues. Patient's last CBC performed on 07/02/2018 revealed Hgb 11.1, HCT 35.5, and MCH 25.9 (all lower than when performed 06/04/2018). Patient with vitamin check on 01/12/2018; VitB12 low at 146, VitD low at 12.43, Ferritin low at 14 (iron normal at 70 and TIBC normal at 347), thiamine/VitB1 low at <6, folate normal at 13.6.  A limited review of symptoms was performed, pertinent positives and negatives as mentioned in HPI.  The following portions of the patient's history were reviewed and updated as appropriate: allergies, current medications and past medical history.  Patient Active Problem List   Diagnosis Date Noted  . Nephrolithiasis   . Grief 04/20/2018  . Bleeding diathesis (HCC) 10/13/2017  . Depressive disorder 07/09/2017  . Dysmenorrhea 07/09/2017  . Menorrhagia 07/09/2017  . Reduced libido 07/09/2017  . History of cesarean section 07/09/2017  . Binge eating disorder 04/23/2017  . Morbid obesity (HCC) 04/23/2017  . 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 Pes planus 07/16/2016  . Hallux varus, acquired, right 03/18/2016  . Sleep disturbance 05/28/2015  . Iron deficiency anemia 02/22/2015  . Kidney stone 08/24/2013  . 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 Anxiety 04/10/2010    Allergies  Allergen Reactions  . Nsaids     Gastric bypass    Current Outpatient Medications on File Prior to Visit  Medication Sig Dispense Refill  . acetaminophen (TYLENOL) 500 MG tablet Take 1,000 mg by  mouth every 6 (six) hours as needed for moderate pain.     Marland Kitchen CALCIUM-MAGNESIUM-ZINC PO Take 3 tablets by mouth daily.    . diclofenac sodium (VOLTAREN) 1 %  GEL Apply topically to affected area qid (Patient taking differently: Apply 2 g topically 4 (four) times daily as needed (pain). Apply topically to affected area qid prn for pain) 100 g 1  . dicyclomine (BENTYL) 20 MG tablet Take 1 tablet (20 mg total) by mouth 2 (two) times daily as needed for spasms. 30 tablet 0  . escitalopram (LEXAPRO) 20 MG tablet Take 1 tablet (20 mg total) by mouth daily. 90 tablet 2  . gabapentin (NEURONTIN) 100 MG capsule 100mg  in the morning and 200 mg at night 90 capsule 2  . Lisdexamfetamine Dimesylate (VYVANSE) 60 MG CHEW Chew 1 tablet by mouth daily. 30 tablet 0  . LORazepam (ATIVAN) 1 MG tablet TAKE 1 TABLET BY MOUTH TWO TIMES DAILY AS NEEDED FOR ANXIETY (Patient taking differently: Take 1 mg by mouth 2 (two) times daily as needed for anxiety. ) 60 tablet 0  . metoCLOPramide (REGLAN) 10 MG tablet Take 1 tablet (10 mg total) by mouth every 6 (six) hours as needed for nausea (nausea/headache). 10 tablet 0  . Multiple Vitamins-Minerals (MULTIVITAMIN ADULT EXTRA C PO) Take 1 tablet by mouth daily.     . ondansetron (ZOFRAN ODT) 4 MG disintegrating tablet Take 1 tablet (4 mg total) by mouth every 8 (eight) hours as needed for nausea or vomiting. 20 tablet 0  . hydrocortisone (ANUSOL-HC) 25 MG suppository Place 1 suppository (25 mg total) rectally 2 (two) times daily. (Patient not taking: Reported on 07/09/2018) 12 suppository 0  . linaclotide (LINZESS) 72 MCG capsule Take 1 capsule (72 mcg total) by mouth daily. (Patient not taking: Reported on 07/09/2018) 30 capsule 5  . polyethylene glycol-electrolytes (NULYTELY/GOLYTELY) 420 g solution Take 4,000 mLs by mouth as directed. (Patient not taking: Reported on 07/09/2018) 4000 mL 0   No current facility-administered medications on file prior to visit.        Objective:   Vitals:   07/09/18 1340  BP: 114/80  Pulse: (!) 104  Resp: 16  Temp: 98.5 F (36.9 C)  SpO2: 97%     Wt Readings from Last 3 Encounters:  07/09/18  238 lb (108 kg)  07/06/18 236 lb (107 kg)  07/02/18 223 lb (101.2 kg)    Physical Exam:   General Appearance:  Patient sitting comfortably on examination table. Conversational. Peri Jefferson self-historian. In no acute distress. Afebrile.   Head:  Normocephalic, without obvious abnormality, atraumatic  Eyes:  PERRL, conjunctiva/corneas clear, EOM's intact  Ears:  Bilateral ear canals WNL. No erythema or edema. No discharge/drainage. Bilateral TMs WNL. No erythema, injection, or serous effusion. No scar tissue.  Nose: Nares normal, septum midline. No discharge. Normal mucosa. No sinus tenderness with percussion/palpation.  Throat: Tongue inspection reveals smooth appearance in ovular center portion, 80% of tongue surface. Scattered flesh colored papules throughout smooth surface. No erythema. No leukoplakia. No swelling/edema. No aphthous ulcers. No bleeding. No petichiae on soft palate. No abnormality to buccal mucosa. Teeth and gums normal. Throat reveals no erythema. Tonsils with no enlargement or exudate.  Neck: Supple, symmetrical, trachea midline, no adenopathy  Lungs:   Clear to auscultation bilaterally, respirations unlabored  Heart:  Regular rate and rhythm, S1 and S2 normal, no murmur, rub, or gallop  Extremities: Extremities normal, atraumatic, no cyanosis or edema  Pulses: 2+ and symmetric  Skin: Skin  color, texture, turgor normal, no rashes or lesions  Lymph nodes: Cervical, supraclavicular, and axillary nodes normal  Neurologic: Normal    Assessment & Plan:    Exam findings, diagnosis etiology and medication use and indications reviewed with patient. Follow-Up and discharge instructions provided. No emergent/urgent issues found on exam.  Patient education was provided.   Patient verbalized understanding of information provided and agrees with plan of care (POC), all questions answered. The patient is advised to call or return to clinic if condition does not see an improvement in  symptoms, or to seek the care of the closest emergency department if condition worsens with the below plan.    1. Tongue pain - magic mouthwash w/lidocaine SOLN; Take 5 mLs by mouth 4 (four) times daily for 7 days.  Dispense: 140 mL; Refill: 0 - sucralfate (CARAFATE) 1 GM/10ML suspension; Take 10 mLs (1 g total) by mouth 4 (four) times daily -  with meals and at bedtime.  Dispense: 420 mL; Refill: 0  Patient with two day history of smooth appearance to center portion of tongue with scattered papules throughout and associated burning pain. May be due to uncontrolled GERD; not currently on H2 blocker or PPI (other than one dose of Prilosec today) - though currently being treated for RUQ pain (possiblity of duodenal ulcer). Also, given patient is s/p Roux-en-Y gastric bypass, could be secondary to vitamin deficiency. Could also be erythematous atrophic candidiasis on the tongue.   Prescribed patient [facility]'s Magic Mouthwash with Lidocaine and Nystatin for symptom relief and also coverage of possible fungal etiology. Prescribed Carafate for possible GERD association; patient scheduled for EGD and colonoscopy early next month by GI.   Advised patient follow-up with PCP for re-evaluation of current nutritional status; possible vitamin deficiency.  Advised patient return to clinic in one week if symptoms not improving, sooner with worsening symptoms. Should go directly to the ED with any swelling of tongue, difficulty swallowing, difficulty breathing, or other new/concerning symptom. Patient agrees with plan.   Janalyn Harder, MHS, PA-C Rulon Sera, MHS, PA-C Advanced Practice Provider [facility]  [address], [facility], [address]st Floor [place], [place] [address] (p):  [phone] [email or web address]

## 2018-07-12 ENCOUNTER — Encounter: Payer: Self-pay | Admitting: Family Medicine

## 2018-07-12 ENCOUNTER — Telehealth: Payer: Self-pay | Admitting: Emergency Medicine

## 2018-07-12 NOTE — Telephone Encounter (Signed)
Left message following up on visit with [facility] 

## 2018-07-13 NOTE — Telephone Encounter (Signed)
Message forwarded to GI.

## 2018-07-20 ENCOUNTER — Ambulatory Visit: Payer: Self-pay | Admitting: Gastroenterology

## 2018-07-20 ENCOUNTER — Ambulatory Visit (INDEPENDENT_AMBULATORY_CARE_PROVIDER_SITE_OTHER): Payer: No Typology Code available for payment source | Admitting: Family Medicine

## 2018-07-20 ENCOUNTER — Encounter: Payer: Self-pay | Admitting: Family Medicine

## 2018-07-20 VITALS — BP 120/88 | HR 100 | Temp 98.6°F | Ht 64.0 in | Wt 237.8 lb

## 2018-07-20 DIAGNOSIS — B001 Herpesviral vesicular dermatitis: Secondary | ICD-10-CM

## 2018-07-20 DIAGNOSIS — B349 Viral infection, unspecified: Secondary | ICD-10-CM | POA: Diagnosis not present

## 2018-07-20 DIAGNOSIS — K649 Unspecified hemorrhoids: Secondary | ICD-10-CM | POA: Diagnosis not present

## 2018-07-20 DIAGNOSIS — Z9884 Bariatric surgery status: Secondary | ICD-10-CM | POA: Diagnosis not present

## 2018-07-20 DIAGNOSIS — R110 Nausea: Secondary | ICD-10-CM

## 2018-07-20 MED ORDER — HYDROCORTISONE ACETATE 25 MG RE SUPP: 25.0000 mg | Freq: Two times a day (BID) | RECTAL | 0 refills | Status: DC

## 2018-07-20 MED ORDER — HYDROCODONE-HOMATROPINE 5-1.5 MG/5ML PO SYRP: 5.0000 mL | ORAL_SOLUTION | Freq: Four times a day (QID) | ORAL | 0 refills | Status: DC | PRN

## 2018-07-20 MED ORDER — VALACYCLOVIR HCL 500 MG PO TABS: ORAL_TABLET | ORAL | 3 refills | Status: DC

## 2018-07-20 MED ORDER — PANTOPRAZOLE SODIUM 40 MG PO TBEC: 40.0000 mg | DELAYED_RELEASE_TABLET | Freq: Every day | ORAL | 3 refills | Status: DC

## 2018-07-20 MED FILL — HYDROCODONE-HOMATROPINE SYR: 5-1.5 | 6 days supply | Qty: 120 | Fill #0

## 2018-07-20 MED FILL — PANTOPRAZOLE SOD DR 40 MG T: 40 | 30 days supply | Qty: 30 | Fill #0

## 2018-07-20 MED FILL — HYDROCORTISONE ACETATE 25 M: 25 | 6 days supply | Qty: 12 | Fill #0

## 2018-07-20 NOTE — Addendum Note (Signed)
Addended by: Helane Rima R on: 07/20/2018 09:02 AM   Modules accepted: Orders

## 2018-07-20 NOTE — Progress Notes (Signed)
[name] DixonSamantha D [name] is a 33 y.o. female is here for follow up.  History of Present Illness:   HPI: Patient has been evaluated by GS, GI, ED for nausea and abdominal pain. Pain resolved with Linzess. Nausea remains. Upcoming EGD/colonoscpy. Still with hemorrhoid. Labs and CT reassuring. If scopes negative, HIDA scan is being considered. On Carafate but not on PPI.   Acute onset of ear congestion, runny nose, cough, and malaise. No fever. No treatment.   There are no preventive care reminders to display for this patient. [facility] 06/01/2018 04/20/2018 01/12/2018  Decreased Interest 1 2 0  Down, Depressed, Hopeless 2 2 0  PHQ - 2 Score 3 4 0  Altered sleeping 3 3 1   Tired, decreased energy 2 2 0  Change in appetite 2 2 1   Feeling bad or failure about yourself  0 0 0  Trouble concentrating 1 1 0  Moving slowly or fidgety/restless 0 1 0  Suicidal thoughts 0 0 0  PHQ-9 Score 11 13 2   Difficult doing work/chores Very difficult Very difficult Somewhat difficult  Some recent data might be hidden   PMHx, SurgHx, SocialHx, FamHx, Medications, and Allergies were reviewed in the Visit Navigator and updated as appropriate.   Patient Active Problem List   Diagnosis Date Noted  . Nephrolithiasis   . Grief 04/20/2018  . Bleeding diathesis (HCC) 10/13/2017  . Depressive disorder 07/09/2017  . Dysmenorrhea 07/09/2017  . Menorrhagia 07/09/2017  . Reduced libido 07/09/2017  . History of cesarean section 07/09/2017  . Binge eating disorder 04/23/2017  . Morbid obesity (HCC) 04/23/2017  . 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 Pes planus 07/16/2016  . Hallux varus, acquired, right 03/18/2016  . Sleep disturbance 05/28/2015  . Iron deficiency anemia 02/22/2015  . Kidney stone 08/24/2013  . 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 Anxiety 04/10/2010   Social History   Tobacco Use  . Smoking status: Never Smoker  . Smokeless tobacco: Never Used  Substance  Use Topics  . Alcohol use: No    Comment: rare/socially  . Drug use: No   Current Medications and Allergies   .  acetaminophen (TYLENOL) 500 MG tablet, Take 1,000 mg by mouth every 6 (six) hours as needed for moderate pain. , Disp: , Rfl:  .  CALCIUM-MAGNESIUM-ZINC PO, Take 3 tablets by mouth daily., Disp: , Rfl:  .  diclofenac sodium (VOLTAREN) 1 % GEL, Apply topically to affected area qid (Patient taking differently: Apply 2 g topically 4 (four) times daily as needed (pain). Apply topically to affected area qid prn for pain), Disp: 100 g, Rfl: 1 .  dicyclomine (BENTYL) 20 MG tablet, Take 1 tablet (20 mg total) by mouth 2 (two) times daily as needed for spasms., Disp: 30 tablet, Rfl: 0 .  escitalopram (LEXAPRO) 20 MG tablet, Take 1 tablet (20 mg total) by mouth daily., Disp: 90 tablet, Rfl: 2 .  gabapentin (NEURONTIN) 100 MG capsule, 100mg  in the morning and 200 mg at night, Disp: 90 capsule, Rfl: 2 .  hydrocortisone (ANUSOL-HC) 25 MG suppository, Place 1 suppository (25 mg total) rectally 2 (two) times daily., Disp: 12 suppository, Rfl: 0 .  linaclotide (LINZESS) 72 MCG capsule, Take 1 capsule (72 mcg total) by mouth daily., Disp: 30 capsule, Rfl: 5 .  LORazepam (ATIVAN) 1 MG tablet, TAKE 1 TABLET BY MOUTH TWO TIMES DAILY AS NEEDED FOR ANXIETY (Patient taking differently: Take 1 mg by mouth 2 (two) times daily as  needed for anxiety. ), Disp: 60 tablet, Rfl: 0 .  metoCLOPramide (REGLAN) 10 MG tablet, Take 1 tablet (10 mg total) by mouth every 6 (six) hours as needed for nausea (nausea/headache)., Disp: 10 tablet, Rfl: 0 .  Multiple Vitamins-Minerals (MULTIVITAMIN ADULT EXTRA C PO), Take 1 tablet by mouth daily. , Disp: , Rfl:  .  ondansetron (ZOFRAN ODT) 4 MG disintegrating tablet, Take 1 tablet (4 mg total) by mouth every 8 (eight) hours as needed for nausea or vomiting., Disp: 20 tablet, Rfl: 0 .  polyethylene glycol-electrolytes (NULYTELY/GOLYTELY) 420 g solution, Take 4,000 mLs by mouth as  directed., Disp: 4000 mL, Rfl: 0 .  HYDROcodone-homatropine (HYCODAN) 5-1.5 MG/5ML syrup, Take 5 mLs by mouth every 6 (six) hours as needed for cough., Disp: 120 mL, Rfl: 0 .  Lisdexamfetamine Dimesylate (VYVANSE) 60 MG CHEW, Chew 1 tablet by mouth daily., Disp: 30 tablet, Rfl: 0 .  pantoprazole (PROTONIX) 40 MG tablet, Take 1 tablet (40 mg total) by mouth daily., Disp: 30 tablet, Rfl: 3   Allergies  Allergen Reactions  . Nsaids     Gastric bypass   Review of Systems   Pertinent items are noted in the HPI. Otherwise, a complete ROS is negative.  Vitals   Vitals:   07/20/18 0752  BP: 120/88  Pulse: 100  Temp: 98.6 F (37 C)  TempSrc: Oral  SpO2: 99%  Weight: 237 lb 12.8 oz (107.9 kg)  Height: 5\' 4"  (1.626 m)     Body mass index is 40.82 kg/m.  Physical Exam   Physical Exam Vitals signs and nursing note reviewed.  Constitutional:      Appearance: Normal appearance.  HENT:     Head: Normocephalic and atraumatic.     Right Ear: External ear normal.     Left Ear: External ear normal.     Nose: Mucosal edema and rhinorrhea present.     Mouth/Throat:     Mouth: Mucous membranes are moist. Mucous membranes are pale.     Tonsils: No tonsillar exudate or tonsillar abscesses.  Eyes:     Extraocular Movements: Extraocular movements intact.     Conjunctiva/sclera: Conjunctivae normal.     Pupils: Pupils are equal, round, and reactive to light.  Neck:     Musculoskeletal: Normal range of motion and neck supple.  Cardiovascular:     Rate and Rhythm: Normal rate and regular rhythm.     Heart sounds: Normal heart sounds.  Pulmonary:     Effort: Pulmonary effort is normal.  Abdominal:     Palpations: Abdomen is soft.  Skin:    General: Skin is warm.     Capillary Refill: Capillary refill takes less than 2 seconds.  Neurological:     General: No focal deficit present.     Mental Status: She is alert.  Psychiatric:        Behavior: Behavior normal.     Results for  orders placed or performed during the hospital encounter of 07/02/18  Lipase, blood  Result Value Ref Range   Lipase 36 11 - 51 U/L  Comprehensive metabolic panel  Result Value Ref Range   Sodium 140 135 - 145 mmol/L   Potassium 3.7 3.5 - 5.1 mmol/L   Chloride 107 98 - 111 mmol/L   CO2 26 22 - 32 mmol/L   Glucose, Bld 84 70 - 99 mg/dL   BUN 13 6 - 20 mg/dL   Creatinine, Ser 8.110.54 0.44 - 1.00 mg/dL   Calcium 8.5 (L)  8.9 - 10.3 mg/dL   Total Protein 6.6 6.5 - 8.1 g/dL   Albumin 3.6 3.5 - 5.0 g/dL   AST 20 15 - 41 U/L   ALT 18 0 - 44 U/L   Alkaline Phosphatase 153 (H) 38 - 126 U/L   Total Bilirubin 0.3 0.3 - 1.2 mg/dL   GFR calc non Af Amer >60 >60 mL/min   GFR calc Af Amer >60 >60 mL/min   Anion gap 7 5 - 15  CBC  Result Value Ref Range   WBC 7.6 4.0 - 10.5 K/uL   RBC 4.28 3.87 - 5.11 MIL/uL   Hemoglobin 11.1 (L) 12.0 - 15.0 g/dL   HCT 88.1 (L) 10.3 - 15.9 %   MCV 82.9 80.0 - 100.0 fL   MCH 25.9 (L) 26.0 - 34.0 pg   MCHC 31.3 30.0 - 36.0 g/dL   RDW 45.8 59.2 - 92.4 %   Platelets 288 150 - 400 K/uL   nRBC 0.0 0.0 - 0.2 %  Urinalysis, Routine w reflex microscopic  Result Value Ref Range   Color, Urine YELLOW YELLOW   APPearance CLEAR CLEAR   Specific Gravity, Urine 1.011 1.005 - 1.030   pH 6.0 5.0 - 8.0   Glucose, UA NEGATIVE NEGATIVE mg/dL   Hgb urine dipstick NEGATIVE NEGATIVE   Bilirubin Urine NEGATIVE NEGATIVE   Ketones, ur NEGATIVE NEGATIVE mg/dL   Protein, ur NEGATIVE NEGATIVE mg/dL   Nitrite NEGATIVE NEGATIVE   Leukocytes, UA TRACE (A) NEGATIVE   RBC / HPF 0-5 0 - 5 RBC/hpf   WBC, UA 0-5 0 - 5 WBC/hpf   Bacteria, UA RARE (A) NONE SEEN   Squamous Epithelial / LPF 0-5 0 - 5   Mucus PRESENT   Pregnancy, urine  Result Value Ref Range   Preg Test, Ur NEGATIVE NEGATIVE    Assessment and Plan   Lyandra was seen today for follow-up.  Diagnoses and all orders for this visit:  Viral illness -     HYDROcodone-homatropine (HYCODAN) 5-1.5 MG/5ML syrup; Take 5  mLs by mouth every 6 (six) hours as needed for cough.  Hemorrhoids Comments: Discussed bowel hygeine and precuations. See orders.  Orders: -     hydrocortisone (ANUSOL-HC) 25 MG suppository; Place 1 suppository (25 mg total) rectally 2 (two) times daily.  Nausea Comments: Pain improved. Nausea has not. Linzess improved constipation. EGD/colonoscopy next week. Stop carafate and go back to PPI.  Orders: -     pantoprazole (PROTONIX) 40 MG tablet; Take 1 tablet (40 mg total) by mouth daily.  History of Roux-en-Y gastric bypass Comments: Taking Bariatric MVM, plus Calcium, Mag, Zinc    . Orders and follow up as documented in EpicCare, reviewed diet, exercise and weight control, cardiovascular risk and specific lipid/LDL goals reviewed, reviewed medications and side effects in detail.  . Reviewed expectations re: course of current medical issues. . Outlined signs and symptoms indicating need for more acute intervention. . Patient verbalized understanding and all questions were answered. . Patient received an After Visit Summary.   Helane Rima, DO [place] 07/20/2018

## 2018-07-26 ENCOUNTER — Ambulatory Visit (AMBULATORY_SURGERY_CENTER): Payer: No Typology Code available for payment source | Admitting: Gastroenterology

## 2018-07-26 ENCOUNTER — Encounter: Payer: Self-pay | Admitting: Gastroenterology

## 2018-07-26 VITALS — BP 117/74 | HR 89 | Temp 99.1°F | Resp 11 | Ht 64.0 in | Wt 237.0 lb

## 2018-07-26 DIAGNOSIS — R110 Nausea: Secondary | ICD-10-CM

## 2018-07-26 DIAGNOSIS — K5900 Constipation, unspecified: Secondary | ICD-10-CM | POA: Diagnosis not present

## 2018-07-26 DIAGNOSIS — K2970 Gastritis, unspecified, without bleeding: Secondary | ICD-10-CM | POA: Diagnosis not present

## 2018-07-26 DIAGNOSIS — K2950 Unspecified chronic gastritis without bleeding: Secondary | ICD-10-CM | POA: Diagnosis not present

## 2018-07-26 DIAGNOSIS — K2990 Gastroduodenitis, unspecified, without bleeding: Secondary | ICD-10-CM

## 2018-07-26 DIAGNOSIS — K625 Hemorrhage of anus and rectum: Secondary | ICD-10-CM | POA: Diagnosis not present

## 2018-07-26 DIAGNOSIS — R1011 Right upper quadrant pain: Secondary | ICD-10-CM

## 2018-07-26 MED ORDER — SODIUM CHLORIDE 0.9 % IV SOLN: 500.0000 mL | Freq: Once | INTRAVENOUS | Status: DC

## 2018-07-26 NOTE — Op Note (Addendum)
[facility] Patient Name: [name] [name] Procedure Date: 07/26/2018 2:15 PM MRN: [id number] Endoscopist: Rachael Fee , MD Age: 33 Referring MD:  Date of Birth: 1985-10-02 Gender: Female Account #: [id number] Procedure:                Colonoscopy Indications:              right sided abdominal pain, nausea, constipation,                            minor rectal bleeding Medicines:                Monitored Anesthesia Care Procedure:                Pre-Anesthesia Assessment:                           - Prior to the procedure, a History and Physical                            was performed, and patient medications and                            allergies were reviewed. The patient's tolerance of                            previous anesthesia was also reviewed. The risks                            and benefits of the procedure and the sedation                            options and risks were discussed with the patient.                            All questions were answered, and informed consent                            was obtained. Prior Anticoagulants: The patient has                            taken no previous anticoagulant or antiplatelet                            agents. ASA Grade Assessment: III - A patient with                            severe systemic disease. After reviewing the risks                            and benefits, the patient was deemed in                            satisfactory condition to undergo the procedure.  After obtaining informed consent, the colonoscope                            was passed under direct vision. Throughout the                            procedure, the patient's blood pressure, pulse, and                            oxygen saturations were monitored continuously. The                            Model PCF-[id number]) scope was introduced                            through the anus and advanced  to the the terminal                            ileum. The colonoscopy was performed without                            difficulty. The patient tolerated the procedure                            well. The quality of the bowel preparation was                            good. The terminal ileum, ileocecal valve,                            appendiceal orifice, and rectum were photographed. Scope In: 2:31:36 PM Scope Out: 2:44:15 PM Scope Withdrawal Time: 0 hours 7 minutes 47 seconds  Total Procedure Duration: 0 hours 12 minutes 39 seconds  Findings:                 The terminal ileum appeared normal.                           The entire examined colon appeared normal on direct                            and retroflexion views. Complications:            No immediate complications. Estimated blood loss:                            None. Estimated Blood Loss:     Estimated blood loss: none. Impression:               - The examined portion of the ileum was normal.                           - The entire examined colon is normal on direct and  retroflexion views.                           - No polyps or cancers. Recommendation:           - Patient has a contact number available for                            emergencies. The signs and symptoms of potential                            delayed complications were discussed with the                            patient. Return to normal activities tomorrow.                            Written discharge instructions were provided to the                            patient.                           - Resume previous diet.                           - Continue present medications.                           - Repeat colonoscopy at age 33 (in 7 years) for                            family history of colon cancer (father at age 35) Rachael FeeDaniel P Adoria Kawamoto, MD 07/26/2018 2:51:13 PM This report has been signed electronically.

## 2018-07-26 NOTE — Progress Notes (Signed)
Called to room to assist during endoscopic procedure.  Patient ID and intended procedure confirmed with present staff. Received instructions for my participation in the procedure from the performing physician.  

## 2018-07-26 NOTE — Patient Instructions (Signed)
Await biopsy results.  Repeat colonoscopy at age 33.  YOU HAD AN ENDOSCOPIC PROCEDURE TODAY AT THE [facility]:   Refer to the procedure report that was given to you for any specific questions about what was found during the examination.  If the procedure report does not answer your questions, please call your gastroenterologist to clarify.  If you requested that your care partner not be given the details of your procedure findings, then the procedure report has been included in a sealed envelope for you to review at your convenience later.  YOU SHOULD EXPECT: Some feelings of bloating in the abdomen. Passage of more gas than usual.  Walking can help get rid of the air that was put into your GI tract during the procedure and reduce the bloating. If you had a lower endoscopy (such as a colonoscopy or flexible sigmoidoscopy) you may notice spotting of blood in your stool or on the toilet paper. If you underwent a bowel prep for your procedure, you may not have a normal bowel movement for a few days.  Please Note:  You might notice some irritation and congestion in your nose or some drainage.  This is from the oxygen used during your procedure.  There is no need for concern and it should clear up in a day or so.  SYMPTOMS TO REPORT IMMEDIATELY:   Following lower endoscopy (colonoscopy or flexible sigmoidoscopy):  Excessive amounts of blood in the stool  Significant tenderness or worsening of abdominal pains  Swelling of the abdomen that is new, acute  Fever of 100F or higher   Following upper endoscopy (EGD)  Vomiting of blood or coffee ground material  New chest pain or pain under the shoulder blades  Painful or persistently difficult swallowing  New shortness of breath  Fever of 100F or higher  Black, tarry-looking stools  For urgent or emergent issues, a gastroenterologist can be reached at any hour by calling [phone].   DIET:  We do recommend a small meal at first,  but then you may proceed to your regular diet.  Drink plenty of fluids but you should avoid alcoholic beverages for 24 hours.  ACTIVITY:  You should plan to take it easy for the rest of today and you should NOT DRIVE or use heavy machinery until tomorrow (because of the sedation medicines used during the test).    FOLLOW UP: Our staff will call the number listed on your records the next business day following your procedure to check on you and address any questions or concerns that you may have regarding the information given to you following your procedure. If we do not reach you, we will leave a message.  However, if you are feeling well and you are not experiencing any problems, there is no need to return our call.  We will assume that you have returned to your regular daily activities without incident.  If any biopsies were taken you will be contacted by phone or by letter within the next 1-3 weeks.  Please call us at [phone] if you have not heard about the biopsies in 3 weeks.    SIGNATURES/CONFIDENTIALITY: You and/or your care partner have signed paperwork which will be entered into your electronic medical record.  These signatures attest to the fact that that the information above on your After Visit Summary has been reviewed and is understood.  Full responsibility of the confidentiality of this discharge information lies with you and/or your care-partner.

## 2018-07-26 NOTE — Op Note (Signed)
[facility] Patient Name: [name] [name] Procedure Date: 07/26/2018 2:15 PM MRN: [id number] Endoscopist: Rachael Feeaniel P [name] [name] , MD Age: [age] Referring MD:  Date of Birth: 24-Jun-1985 Gender: Female Account #: [id number] Procedure:                Upper GI endoscopy Indications:              right sided abdominal pain, nausea, s/p Roux en y                            gastric bypass Medicines:                Monitored Anesthesia Care Procedure:                Pre-Anesthesia Assessment:                           - Prior to the procedure, a History and Physical                            was performed, and patient medications and                            allergies were reviewed. The patient's tolerance of                            previous anesthesia was also reviewed. The risks                            and benefits of the procedure and the sedation                            options and risks were discussed with the patient.                            All questions were answered, and informed consent                            was obtained. Prior Anticoagulants: The patient has                            taken no previous anticoagulant or antiplatelet                            agents. ASA Grade Assessment: III - A patient with                            severe systemic disease. After reviewing the risks                            and benefits, the patient was deemed in                            satisfactory condition to undergo the procedure.  After obtaining informed consent, the endoscope was                            passed under direct vision. Throughout the                            procedure, the patient's blood pressure, pulse, and                            oxygen saturations were monitored continuously. The                            Endoscope was introduced through the mouth, and                            advanced to the afferent and  efferent jejunal                            loops. The upper GI endoscopy was accomplished                            without difficulty. The patient tolerated the                            procedure well. Scope In: Scope Out: Findings:                 Typical Roux en y bariatric bypass anatomy; wide                            open gastroenteric anastomosis                           Mild inflammation was found in the gastric remnant                            (proximal stomach). Biopsies were taken with a cold                            forceps for histology. Complications:            No immediate complications. Estimated blood loss:                            None. Estimated Blood Loss:     Estimated blood loss: none. Impression:               - Typical Roux en y bariatric bypass anatomy; wide                            open gastroenteric anastomosis                           - Mild gastritis in remnant stomach, biopsied to  check for H. pylori. Recommendation:           - Patient has a contact number available for                            emergencies. The signs and symptoms of potential                            delayed complications were discussed with the                            patient. Return to normal activities tomorrow.                            Written discharge instructions were provided to the                            patient.                           - Resume previous diet.                           - Continue present medications.                           - Await pathology results.                           - If the biopsies do not explain her symptoms, she                            will need further testing (CT scan with IV and oral                            contrast) Rachael Feeaniel P [name] Steeves, MD 07/26/2018 2:56:47 PM This report has been signed electronically.

## 2018-07-26 NOTE — Progress Notes (Signed)
Report given to PACU, vss 

## 2018-07-27 ENCOUNTER — Telehealth: Payer: Self-pay

## 2018-07-27 NOTE — Telephone Encounter (Signed)
Follow up call made x 2 no answer, number identifier, left a voicemail.

## 2018-07-27 NOTE — Telephone Encounter (Signed)
First post procedure follow up call, no answer 

## 2018-07-30 ENCOUNTER — Other Ambulatory Visit: Payer: Self-pay | Admitting: Family Medicine

## 2018-07-30 DIAGNOSIS — F4321 Adjustment disorder with depressed mood: Secondary | ICD-10-CM

## 2018-07-30 DIAGNOSIS — F5081 Binge eating disorder: Secondary | ICD-10-CM

## 2018-07-30 MED FILL — VALACYCLOVIR HCL 500 MG TAB: 500 | 1 days supply | Qty: 5 | Fill #0

## 2018-07-30 MED FILL — LORazepam 1 MG TABS: 1 | 30 days supply | Qty: 60 | Fill #0

## 2018-07-30 NOTE — Telephone Encounter (Signed)
Ok to fill 

## 2018-07-30 NOTE — Telephone Encounter (Signed)
Looks like she needs one refill to last to app first of next month

## 2018-08-17 ENCOUNTER — Ambulatory Visit (INDEPENDENT_AMBULATORY_CARE_PROVIDER_SITE_OTHER): Payer: No Typology Code available for payment source | Admitting: Family Medicine

## 2018-08-17 ENCOUNTER — Encounter: Payer: Self-pay | Admitting: Family Medicine

## 2018-08-17 VITALS — BP 120/72 | HR 98 | Temp 98.6°F | Ht 64.0 in | Wt 239.4 lb

## 2018-08-17 DIAGNOSIS — K581 Irritable bowel syndrome with constipation: Secondary | ICD-10-CM | POA: Diagnosis not present

## 2018-08-17 DIAGNOSIS — F5081 Binge eating disorder: Secondary | ICD-10-CM

## 2018-08-17 DIAGNOSIS — F4321 Adjustment disorder with depressed mood: Secondary | ICD-10-CM | POA: Diagnosis not present

## 2018-08-17 DIAGNOSIS — M79672 Pain in left foot: Secondary | ICD-10-CM | POA: Diagnosis not present

## 2018-08-17 DIAGNOSIS — R6882 Decreased libido: Secondary | ICD-10-CM

## 2018-08-17 DIAGNOSIS — R109 Unspecified abdominal pain: Secondary | ICD-10-CM | POA: Insufficient documentation

## 2018-08-17 DIAGNOSIS — R1011 Right upper quadrant pain: Secondary | ICD-10-CM

## 2018-08-17 MED ORDER — GABAPENTIN 100 MG PO CAPS: ORAL_CAPSULE | ORAL | 2 refills | Status: DC

## 2018-08-17 MED ORDER — LORAZEPAM 1 MG PO TABS: 1.0000 mg | ORAL_TABLET | Freq: Two times a day (BID) | ORAL | 0 refills | Status: DC | PRN

## 2018-08-17 MED ORDER — LISDEXAMFETAMINE DIMESYLATE 60 MG PO CHEW: 60.0000 mg | CHEWABLE_TABLET | Freq: Every day | ORAL | 0 refills | Status: DC

## 2018-08-17 MED FILL — GABAPENTIN 100 MG CAPSULE: 100 | 90 days supply | Qty: 90 | Fill #0

## 2018-08-17 NOTE — Assessment & Plan Note (Signed)
Improving. Now having open conversations with mother. Feeling much better that mother is safe (not talking about being unable to live without her father).

## 2018-08-17 NOTE — Assessment & Plan Note (Signed)
Choosing to be "happy with no sex." Not worried about it right now. Discussed new medication v adding Wellbutrin.

## 2018-08-17 NOTE — Assessment & Plan Note (Signed)
RESOLVED. 2/20 EGD with mild gastritis, negative H. Pylori. Colonoscopy normal. After, Dr. Christella Hartigan offered CT abdomen/pelvis with oral and IV contrast to evaluate further. Patient opted to hold and monitor pain. Quit class that she had been taking and had open discussion with her mother about anxiety re: mom's mood since dad's passing. Pain is now gone. Linzess has been very helpful for daily BMs.

## 2018-08-17 NOTE — Assessment & Plan Note (Signed)
Lab Results  Component Value Date   WBC 7.6 07/02/2018   HGB 11.1 (L) 07/02/2018   HCT 35.5 (L) 07/02/2018   MCV 82.9 07/02/2018   PLT 288 07/02/2018   Lab Results  Component Value Date   IRON 70 01/12/2018   TIBC 347 01/12/2018   FERRITIN 14 (L) 01/12/2018

## 2018-08-17 NOTE — Progress Notes (Signed)
[name] [name] is a 33 y.o. female is here for follow up.  Assessment and Plan:  Iron deficiency anemia Lab Results  Component Value Date   WBC 7.6 07/02/2018   HGB 11.1 (L) 07/02/2018   HCT 35.5 (L) 07/02/2018   MCV 82.9 07/02/2018   PLT 288 07/02/2018   Lab Results  Component Value Date   IRON 70 01/12/2018   TIBC 347 01/12/2018   FERRITIN 14 (L) 01/12/2018    Grief Improving. Now having open conversations with mother. Feeling much better that mother is safe (not talking about being unable to live without her father).   Reduced libido, due to Lexapro Choosing to be "happy with no sex." Not worried about it right now. Discussed new medication v adding Wellbutrin.   Abdominal pain RESOLVED. 2/20 EGD with mild gastritis, negative H. Pylori. Colonoscopy normal. After, Dr. Christella HartiganJacobs offered CT abdomen/pelvis with oral and IV contrast to evaluate further. Patient opted to hold and monitor pain. Quit class that she had been taking and had open discussion with her mother about anxiety re: mom's mood since dad's passing. Pain is now gone. Linzess has been very helpful for daily BMs.   Meds ordered this encounter  Medications  . gabapentin (NEURONTIN) 100 MG capsule    Sig: 100mg  in the morning and 200 mg at night    Dispense:  90 capsule    Refill:  2  . Lisdexamfetamine Dimesylate (VYVANSE) 60 MG CHEW    Sig: Chew 60 mg by mouth daily.    Dispense:  30 tablet    Refill:  0  . LORazepam (ATIVAN) 1 MG tablet    Sig: Take 1 tablet (1 mg total) by mouth 2 (two) times daily as needed for anxiety.    Dispense:  60 tablet    Refill:  0   Significant Labs:   Results for orders placed or performed during the hospital encounter of 07/02/18  Lipase, blood  Result Value Ref Range   Lipase 36 11 - 51 U/L  Comprehensive metabolic panel  Result Value Ref Range   Sodium 140 135 - 145 mmol/L   Potassium 3.7 3.5 - 5.1 mmol/L   Chloride 107 98 - 111 mmol/L   CO2 26 22 - 32 mmol/L   Glucose, Bld 84 70 - 99 mg/dL   BUN 13 6 - 20 mg/dL   Creatinine, Ser 1.610.54 0.44 - 1.00 mg/dL   Calcium 8.5 (L) 8.9 - 10.3 mg/dL   Total Protein 6.6 6.5 - 8.1 g/dL   Albumin 3.6 3.5 - 5.0 g/dL   AST 20 15 - 41 U/L   ALT 18 0 - 44 U/L   Alkaline Phosphatase 153 (H) 38 - 126 U/L   Total Bilirubin 0.3 0.3 - 1.2 mg/dL   GFR calc non Af Amer >60 >60 mL/min   GFR calc Af Amer >60 >60 mL/min   Anion gap 7 5 - 15  CBC  Result Value Ref Range   WBC 7.6 4.0 - 10.5 K/uL   RBC 4.28 3.87 - 5.11 MIL/uL   Hemoglobin 11.1 (L) 12.0 - 15.0 g/dL   HCT 09.635.5 (L) 04.536.0 - 40.946.0 %   MCV 82.9 80.0 - 100.0 fL   MCH 25.9 (L) 26.0 - 34.0 pg   MCHC 31.3 30.0 - 36.0 g/dL   RDW 81.115.1 91.411.5 - 78.215.5 %   Platelets 288 150 - 400 K/uL   nRBC 0.0 0.0 - 0.2 %  Urinalysis, Routine w reflex microscopic  Result Value Ref Range   Color, Urine YELLOW YELLOW   APPearance CLEAR CLEAR   Specific Gravity, Urine 1.011 1.005 - 1.030   pH 6.0 5.0 - 8.0   Glucose, UA NEGATIVE NEGATIVE mg/dL   Hgb urine dipstick NEGATIVE NEGATIVE   Bilirubin Urine NEGATIVE NEGATIVE   Ketones, ur NEGATIVE NEGATIVE mg/dL   Protein, ur NEGATIVE NEGATIVE mg/dL   Nitrite NEGATIVE NEGATIVE   Leukocytes, UA TRACE (A) NEGATIVE   RBC / HPF 0-5 0 - 5 RBC/hpf   WBC, UA 0-5 0 - 5 WBC/hpf   Bacteria, UA RARE (A) NONE SEEN   Squamous Epithelial / LPF 0-5 0 - 5   Mucus PRESENT   Pregnancy, urine  Result Value Ref Range   Preg Test, Ur NEGATIVE NEGATIVE   Health Maintenance:  There are no preventive care reminders to display for this patient. Depression screen [facility] 2/9 06/01/2018 04/20/2018 01/12/2018  Decreased Interest 1 2 0  Down, Depressed, Hopeless 2 2 0  PHQ - 2 Score 3 4 0  Altered sleeping 3 3 1   Tired, decreased energy 2 2 0  Change in appetite 2 2 1   Feeling bad or failure about yourself  0 0 0  Trouble concentrating 1 1 0  Moving slowly or fidgety/restless 0 1 0  Suicidal thoughts 0 0 0  PHQ-9 Score 11 13 2   Difficult doing work/chores  Very difficult Very difficult Somewhat difficult  Some recent data might be hidden   PMHx, SurgHx, SocialHx, FamHx, Medications, and Allergies were reviewed in the Visit Navigator and updated as appropriate.   Patient Active Problem List   Diagnosis Date Noted  . Abdominal pain 08/17/2018  . Nephrolithiasis   . Grief 04/20/2018  . Bleeding diathesis (HCC) 10/13/2017  . Depressive disorder 07/09/2017  . Dysmenorrhea 07/09/2017  . Menorrhagia 07/09/2017  . Reduced libido, due to Lexapro 07/09/2017  . History of cesarean section 07/09/2017  . Binge eating disorder 04/23/2017  . Morbid obesity (HCC) 04/23/2017  . 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 Pes planus 07/16/2016  . Hallux varus, acquired, right 03/18/2016  . Sleep disturbance 05/28/2015  . Iron deficiency anemia 02/22/2015  . 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 Anxiety 04/10/2010   Social History   Tobacco Use  . Smoking status: Never Smoker  . Smokeless tobacco: Never Used  Substance Use Topics  . Alcohol use: No    Comment: rare/socially  . Drug use: No   Current Medications and Allergies:   .  acetaminophen (TYLENOL) 500 MG tablet, Take 1,000 mg by mouth every 6 (six) hours as needed for moderate pain. , Disp: , Rfl:  .  CALCIUM-MAGNESIUM-ZINC PO, Take 3 tablets by mouth daily., Disp: , Rfl:  .  diclofenac sodium (VOLTAREN) 1 % GEL, Apply topically to affected area qid (Patient taking differently: Apply 2 g topically 4 (four) times daily as needed (pain). Apply topically to affected area qid prn for pain), Disp: 100 g, Rfl: 1 .  dicyclomine (BENTYL) 20 MG tablet, Take 1 tablet (20 mg total) by mouth 2 (two) times daily as needed for spasms., Disp: 30 tablet, Rfl: 0 .  escitalopram (LEXAPRO) 20 MG tablet, Take 1 tablet (20 mg total) by mouth daily., Disp: 90 tablet, Rfl: 2 .  gabapentin (NEURONTIN) 100 MG capsule, 100mg  in the morning and 200 mg at night, Disp: 90 capsule, Rfl: 2 .   hydrocortisone (ANUSOL-HC) 25 MG suppository, Place 1 suppository (25 mg total) rectally 2 (  two) times daily., Disp: 12 suppository, Rfl: 0 .  linaclotide (LINZESS) 72 MCG capsule, Take 1 capsule (72 mcg total) by mouth daily., Disp: 30 capsule, Rfl: 5 .  LORazepam (ATIVAN) 1 MG tablet, TAKE 1 TABLET BY MOUTH TWO TIMES DAILY AS NEEDED FOR ANXIETY, Disp: 60 tablet, Rfl: 0 .  metoCLOPramide (REGLAN) 10 MG tablet, Take 1 tablet (10 mg total) by mouth every 6 (six) hours as needed for nausea (nausea/headache)., Disp: 10 tablet, Rfl: 0 .  Multiple Vitamins-Minerals (MULTIVITAMIN ADULT EXTRA C PO), Take 1 tablet by mouth daily. , Disp: , Rfl:  .  ondansetron (ZOFRAN ODT) 4 MG disintegrating tablet, Take 1 tablet (4 mg total) by mouth every 8 (eight) hours as needed for nausea or vomiting., Disp: 20 tablet, Rfl: 0 .  pantoprazole (PROTONIX) 40 MG tablet, Take 1 tablet (40 mg total) by mouth daily., Disp: 30 tablet, Rfl: 3 .  valACYclovir (VALTREX) 500 MG tablet, 2 po in am and 2 po in pm x 1 day., Disp: 5 tablet, Rfl: 3 .  VYVANSE 60 MG CHEW, CHEW 1 TABLET BY MOUTH DAILY. DO NOT FILL UNTIL 05/20/18, Disp: 30 tablet, Rfl: 0    Allergies  Allergen Reactions  . Nsaids     Gastric bypass   Review of Systems   Pertinent items are noted in the HPI. Otherwise, ROS is negative.  Vitals:   Vitals:   08/17/18 0752  BP: 120/72  Pulse: 98  Temp: 98.6 F (37 C)  TempSrc: Oral  SpO2: 99%  Weight: 239 lb 6.4 oz (108.6 kg)  Height: 5\' 4"  (1.626 m)     Body mass index is 41.09 kg/m. Physical Exam:   Physical Exam Vitals signs and nursing note reviewed.  HENT:     Head: Normocephalic and atraumatic.  Eyes:     Pupils: Pupils are equal, round, and reactive to light.  Neck:     Musculoskeletal: Normal range of motion and neck supple.  Cardiovascular:     Rate and Rhythm: Normal rate and regular rhythm.     Heart sounds: Normal heart sounds.  Pulmonary:     Effort: Pulmonary effort is normal.    Abdominal:     Palpations: Abdomen is soft.  Skin:    General: Skin is warm.  Psychiatric:        Behavior: Behavior normal.     . Reviewed expectations re: course of current medical issues. . Discussed self-management of symptoms. . Outlined signs and symptoms indicating need for more acute intervention. . Patient verbalized understanding and all questions were answered. Marland Kitchen Health Maintenance issues including appropriate healthy diet, exercise, and smoking avoidance were discussed with patient. . See orders for this visit as documented in the electronic medical record. . Patient received an After Visit Summary.  Helane Rima, DO [place], [facility] 08/17/2018

## 2018-08-18 MED FILL — PANTOPRAZOLE SOD DR 40 MG T: 40 | 90 days supply | Qty: 90 | Fill #1

## 2018-08-18 MED FILL — LINZESS 72 MCG CAPSULE: 72 | 30 days supply | Qty: 30 | Fill #1

## 2018-08-18 MED FILL — ESCITALOPRAM 20 MG TABLET: 20 | 90 days supply | Qty: 90 | Fill #1

## 2018-08-23 MED FILL — VALACYCLOVIR HCL 500 MG TAB: 500 | 1 days supply | Qty: 5 | Fill #1

## 2018-08-24 MED FILL — VYVANSE 60 MG CHEW: 60 | 30 days supply | Qty: 30 | Fill #0

## 2018-08-27 MED FILL — LORazepam 1 MG TABS: 1 | 30 days supply | Qty: 60 | Fill #0

## 2018-09-26 ENCOUNTER — Encounter: Payer: Self-pay | Admitting: Family Medicine

## 2018-09-26 DIAGNOSIS — F4321 Adjustment disorder with depressed mood: Secondary | ICD-10-CM

## 2018-09-27 MED ORDER — LORAZEPAM 1 MG PO TABS: 1.0000 mg | ORAL_TABLET | Freq: Two times a day (BID) | ORAL | 0 refills | Status: DC | PRN

## 2018-09-27 MED FILL — LINZESS 72 MCG CAPSULE: 72 | 30 days supply | Qty: 30 | Fill #2

## 2018-09-27 MED FILL — LORazepam 1 MG TABS: 1 | 30 days supply | Qty: 60 | Fill #0

## 2018-09-27 MED FILL — VYVANSE 60 MG CHEW: 60 | 30 days supply | Qty: 30 | Fill #0

## 2018-10-15 ENCOUNTER — Ambulatory Visit (INDEPENDENT_AMBULATORY_CARE_PROVIDER_SITE_OTHER): Payer: No Typology Code available for payment source | Admitting: Family Medicine

## 2018-10-15 ENCOUNTER — Other Ambulatory Visit: Payer: Self-pay

## 2018-10-15 VITALS — Ht 64.0 in | Wt 239.0 lb

## 2018-10-15 DIAGNOSIS — Z9884 Bariatric surgery status: Secondary | ICD-10-CM | POA: Diagnosis not present

## 2018-10-15 DIAGNOSIS — M2550 Pain in unspecified joint: Secondary | ICD-10-CM

## 2018-10-15 DIAGNOSIS — R000 Tachycardia, unspecified: Secondary | ICD-10-CM

## 2018-10-15 DIAGNOSIS — R600 Localized edema: Secondary | ICD-10-CM | POA: Diagnosis not present

## 2018-10-15 DIAGNOSIS — R251 Tremor, unspecified: Secondary | ICD-10-CM

## 2018-10-15 NOTE — Progress Notes (Signed)
Virtual Visit via Video   Due to the COVID-19 pandemic, this visit was completed with telemedicine (audio/video) technology to reduce patient and provider exposure as well as to preserve personal protective equipment.   I connected with [name] [name] by a video enabled telemedicine application and verified that I am speaking with the correct person using two identifiers. Location patient: Home Location provider: [employer], Office Persons participating in the virtual visit: [name] [name], [name] Rima, DO [name] [name], CMA acting as scribe for Dr. Helane [name].   I discussed the limitations of evaluation and management by telemedicine and the availability of in person appointments. The patient expressed understanding and agreed to proceed.  Care Team   Patient Care Team: [name] Rima, DO as PCP - General (Family Medicine) [name] Graff, MD as Referring Physician (Obstetrics and Gynecology) Himmelrich, [name] [name], RD (Inactive) as Dietitian [name] [name])  Subjective:   HPI:  Patient denies any environmental changes. No changes in diet and she is taking her bariatric vitamins. No changes in bowel movements.   Restless leg syndrome patient states that she has had increased leg pain at night. Husband informed her that she is moving her legs all night.    Tremor in both hands with joint pain started about a week ago. She states when she lays hand flat that she has tremor in some fingers.   Leg swelling: Patient has swelling in both legs from the knee down. Improved after taking hot bath. Swelling started about 2-3 days ago.   Tachycardia: Patient has history of rapid heart rate. She states that she has been keeping track of heart rate. She has history of this in the past. She will come by office for labs and possible EKG.   Review of Systems  Constitutional: Negative for chills and fever.  HENT: Negative for hearing loss.   Eyes: Negative for blurred vision.    Respiratory: Negative for cough.   Cardiovascular: Negative for chest pain.  Gastrointestinal: Negative for heartburn.  Genitourinary: Negative for dysuria.  Musculoskeletal: Negative for myalgias.  Skin: Negative for rash.  Neurological: Negative for dizziness.  Endo/Heme/Allergies: Does not bruise/bleed easily.  Psychiatric/Behavioral: Negative for depression.    Patient Active Problem List   Diagnosis Date Noted  . Abdominal pain 08/17/2018  . Nephrolithiasis   . Grief 04/20/2018  . Bleeding diathesis (HCC) 10/13/2017  . Depressive disorder 07/09/2017  . Dysmenorrhea 07/09/2017  . Menorrhagia 07/09/2017  . Reduced libido, due to Lexapro 07/09/2017  . History of cesarean section 07/09/2017  . Binge eating disorder 04/23/2017  . Morbid obesity (HCC) 04/23/2017  . 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 Pes planus 07/16/2016  . Hallux varus, acquired, right 03/18/2016  . Sleep disturbance 05/28/2015  . Iron deficiency anemia 02/22/2015  . 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 Anxiety 04/10/2010    Social History   Tobacco Use  . Smoking status: Never Smoker  . Smokeless tobacco: Never Used  Substance Use Topics  . Alcohol use: No    Comment: rare/socially    Current Outpatient Medications:  .  acetaminophen (TYLENOL) 500 MG tablet, Take 1,000 mg by mouth every 6 (six) hours as needed for moderate pain. , Disp: , Rfl:  .  CALCIUM-MAGNESIUM-ZINC PO, Take 3 tablets by mouth daily., Disp: , Rfl:  .  diclofenac sodium (VOLTAREN) 1 % GEL, Apply topically to affected area qid (Patient taking differently: Apply 2 g topically 4 (four) times daily as needed (pain). Apply topically  to affected area qid prn for pain), Disp: 100 g, Rfl: 1 .  escitalopram (LEXAPRO) 20 MG tablet, Take 1 tablet (20 mg total) by mouth daily., Disp: 90 tablet, Rfl: 2 .  gabapentin (NEURONTIN) 100 MG capsule, 100mg  in the morning and 200 mg at night, Disp: 90 capsule, Rfl: 2 .   linaclotide (LINZESS) 72 MCG capsule, Take 1 capsule (72 mcg total) by mouth daily., Disp: 30 capsule, Rfl: 5 .  Lisdexamfetamine Dimesylate (VYVANSE) 60 MG CHEW, Chew 60 mg by mouth daily., Disp: 30 tablet, Rfl: 0 .  LORazepam (ATIVAN) 1 MG tablet, Take 1 tablet (1 mg total) by mouth 2 (two) times daily as needed for anxiety., Disp: 60 tablet, Rfl: 0 .  metoCLOPramide (REGLAN) 10 MG tablet, Take 1 tablet (10 mg total) by mouth every 6 (six) hours as needed for nausea (nausea/headache)., Disp: 10 tablet, Rfl: 0 .  Multiple Vitamins-Minerals (MULTIVITAMIN ADULT EXTRA C PO), Take 1 tablet by mouth daily. , Disp: , Rfl:  .  ondansetron (ZOFRAN ODT) 4 MG disintegrating tablet, Take 1 tablet (4 mg total) by mouth every 8 (eight) hours as needed for nausea or vomiting., Disp: 20 tablet, Rfl: 0 .  pantoprazole (PROTONIX) 40 MG tablet, Take 1 tablet (40 mg total) by mouth daily., Disp: 30 tablet, Rfl: 3 .  valACYclovir (VALTREX) 500 MG tablet, 2 po in am and 2 po in pm x 1 day., Disp: 5 tablet, Rfl: 3  Current Facility-Administered Medications:  .  0.9 %  sodium chloride infusion, 500 mL, Intravenous, Once, [name] Fee, MD  Allergies  Allergen Reactions  . Nsaids     Gastric bypass    Objective:   VITALS: Per patient if applicable, see vitals. GENERAL: Alert and in no acute distress. CARDIOPULMONARY: No increased WOB. Speaking in clear sentences.  PSYCH: Pleasant and cooperative. Speech normal rate and rhythm. Affect is appropriate. Insight and judgement are appropriate. Attention is focused, linear, and appropriate.  NEURO: Oriented as arrived to appointment on time with no prompting.   Depression screen [facility] 2/9 08/17/2018 06/01/2018 04/20/2018  Decreased Interest 1 1 2   Down, Depressed, Hopeless 0 2 2  PHQ - 2 Score 1 3 4   Altered sleeping 3 3 3   Tired, decreased energy 1 2 2   Change in appetite 3 2 2   Feeling bad or failure about yourself  0 0 0  Trouble concentrating 0 1 1  Moving  slowly or fidgety/restless 0 0 1  Suicidal thoughts 0 0 0  PHQ-9 Score 8 11 13   Difficult doing work/chores - Very difficult Very difficult  Some recent data might be hidden   Assessment and Plan:   [name] [name] was seen today for leg pain.  Diagnoses and all orders for this visit:  Bilateral lower extremity edema -     Brain natriuretic peptide; Future  History of Roux-en-Y gastric bypass  Morbid obesity (HCC)  Multiple joint pain  Tachycardia -     CBC with Differential/Platelet; Future -     Comprehensive metabolic panel; Future -     Iron, TIBC and Ferritin Panel; Future -     TSH; Future -     Magnesium; Future  Tremor   . COVID-19 Education: The signs and symptoms of COVID-19 were discussed with the patient and how to seek care for testing if needed. The importance of social distancing was discussed today. . Reviewed expectations re: course of current medical issues. . Discussed self-management of symptoms. . Outlined signs and  symptoms indicating need for more acute intervention. . Patient verbalized understanding and all questions were answered. Marland Kitchen. Health Maintenance issues including appropriate healthy diet, exercise, and smoking avoidance were discussed with patient. . See orders for this visit as documented in the electronic medical record.  [name] RimaErica Taishawn Smaldone, DO  Records requested if needed. Time spent: 25 minutes, of which >50% was spent in obtaining information about her symptoms, reviewing her previous labs, evaluations, and treatments, counseling her about her condition (please see the discussed topics above), and developing a plan to further investigate it; she had a number of questions which I addressed.

## 2018-10-18 ENCOUNTER — Encounter: Payer: Self-pay | Admitting: Family Medicine

## 2018-10-19 ENCOUNTER — Ambulatory Visit (INDEPENDENT_AMBULATORY_CARE_PROVIDER_SITE_OTHER): Payer: No Typology Code available for payment source | Admitting: Family Medicine

## 2018-10-19 ENCOUNTER — Ambulatory Visit: Payer: No Typology Code available for payment source | Admitting: Family Medicine

## 2018-10-19 ENCOUNTER — Encounter: Payer: Self-pay | Admitting: Family Medicine

## 2018-10-19 DIAGNOSIS — R000 Tachycardia, unspecified: Secondary | ICD-10-CM

## 2018-10-19 DIAGNOSIS — R600 Localized edema: Secondary | ICD-10-CM

## 2018-10-19 DIAGNOSIS — R748 Abnormal levels of other serum enzymes: Secondary | ICD-10-CM

## 2018-10-19 DIAGNOSIS — D509 Iron deficiency anemia, unspecified: Secondary | ICD-10-CM

## 2018-10-19 LAB — COMPREHENSIVE METABOLIC PANEL
ALT: 15 U/L (ref 0–35)
AST: 17 U/L (ref 0–37)
Albumin: 4.3 g/dL (ref 3.5–5.2)
Alkaline Phosphatase: 199 U/L — ABNORMAL HIGH (ref 39–117)
BUN: 19 mg/dL (ref 6–23)
CO2: 26 mEq/L (ref 19–32)
Calcium: 9 mg/dL (ref 8.4–10.5)
Chloride: 104 mEq/L (ref 96–112)
Creatinine, Ser: 0.56 mg/dL (ref 0.40–1.20)
GFR: 123.66 mL/min (ref >60.00)
Glucose, Bld: 82 mg/dL (ref 70–99)
Potassium: 4.9 mEq/L (ref 3.5–5.1)
Sodium: 138 mEq/L (ref 135–145)
Total Bilirubin: 0.3 mg/dL (ref 0.2–1.2)
Total Protein: 7 g/dL (ref 6.0–8.3)

## 2018-10-19 LAB — CBC WITH DIFFERENTIAL/PLATELET
Basophils Absolute: 0.1 10*3/uL (ref 0.0–0.1)
Basophils Relative: 1.3 % (ref 0.0–3.0)
Eosinophils Absolute: 0.1 10*3/uL (ref 0.0–0.7)
Eosinophils Relative: 0.7 % (ref 0.0–5.0)
HCT: 37.8 % (ref 36.0–46.0)
Hemoglobin: 12.7 g/dL (ref 12.0–15.0)
Lymphocytes Relative: 26.5 % (ref 12.0–46.0)
Lymphs Abs: 1.9 10*3/uL (ref 0.7–4.0)
MCHC: 33.6 g/dL (ref 30.0–36.0)
MCV: 80.2 fl (ref 78.0–100.0)
Monocytes Absolute: 0.5 10*3/uL (ref 0.1–1.0)
Monocytes Relative: 6.6 % (ref 3.0–12.0)
Neutro Abs: 4.5 10*3/uL (ref 1.4–7.7)
Neutrophils Relative %: 64.9 % (ref 43.0–77.0)
Platelets: 355 10*3/uL (ref 150.0–400.0)
RBC: 4.72 Mil/uL (ref 3.87–5.11)
RDW: 16.2 % — ABNORMAL HIGH (ref 11.5–15.5)
WBC: 7 10*3/uL (ref 4.0–10.5)

## 2018-10-19 LAB — TSH: TSH: 2.25 u[IU]/mL (ref 0.35–4.50)

## 2018-10-19 LAB — BRAIN NATRIURETIC PEPTIDE: Pro B Natriuretic peptide (BNP): 7 pg/mL (ref 0.0–100.0)

## 2018-10-19 LAB — MAGNESIUM: Magnesium: 2 mg/dL (ref 1.5–2.5)

## 2018-10-19 NOTE — Progress Notes (Addendum)
EKG and LABS only.   EKG: normal EKG, normal sinus rhythm, unchanged from previous tracings.   Helane Rima, DO

## 2018-10-20 LAB — IRON,TIBC AND FERRITIN PANEL
%SAT: 16 % (calc) (ref 16–45)
Ferritin: 9 ng/mL — ABNORMAL LOW (ref 16–154)
Iron: 74 ug/dL (ref 40–190)
TIBC: 454 mcg/dL (calc) — ABNORMAL HIGH (ref 250–450)

## 2018-10-25 ENCOUNTER — Other Ambulatory Visit: Payer: Self-pay | Admitting: Family Medicine

## 2018-10-25 DIAGNOSIS — F4321 Adjustment disorder with depressed mood: Secondary | ICD-10-CM

## 2018-10-25 MED ORDER — LORAZEPAM 1 MG PO TABS: 1.0000 mg | ORAL_TABLET | Freq: Two times a day (BID) | ORAL | 0 refills | Status: DC | PRN

## 2018-10-25 MED FILL — LORazepam 1 MG TABS: 1 | 30 days supply | Qty: 60 | Fill #0

## 2018-10-25 NOTE — Telephone Encounter (Signed)
Last OV 10/19/18 Last refill 09/27/18 #60/0 Next OV 11/16/18

## 2018-10-26 ENCOUNTER — Ambulatory Visit (INDEPENDENT_AMBULATORY_CARE_PROVIDER_SITE_OTHER): Payer: No Typology Code available for payment source | Admitting: Family Medicine

## 2018-10-26 ENCOUNTER — Other Ambulatory Visit: Payer: Self-pay

## 2018-10-26 DIAGNOSIS — R600 Localized edema: Secondary | ICD-10-CM | POA: Insufficient documentation

## 2018-10-26 DIAGNOSIS — G4762 Sleep related leg cramps: Secondary | ICD-10-CM

## 2018-10-26 DIAGNOSIS — R000 Tachycardia, unspecified: Secondary | ICD-10-CM | POA: Diagnosis not present

## 2018-10-26 DIAGNOSIS — R790 Abnormal level of blood mineral: Secondary | ICD-10-CM

## 2018-10-26 NOTE — Progress Notes (Signed)
[name] [name] is a 33 y.o. female is here for follow up.  History of Present Illness:   HPI: Reviewed labs.  There are no preventive care reminders to display for this patient. Depression screen [facility] 2/9 08/17/2018 06/01/2018 04/20/2018  Decreased Interest 1 1 2   Down, Depressed, Hopeless 0 2 2  PHQ - 2 Score 1 3 4   Altered sleeping 3 3 3   Tired, decreased energy 1 2 2   Change in appetite 3 2 2   Feeling bad or failure about yourself  0 0 0  Trouble concentrating 0 1 1  Moving slowly or fidgety/restless 0 0 1  Suicidal thoughts 0 0 0  PHQ-9 Score 8 11 13   Difficult doing work/chores - Very difficult Very difficult  Some recent data might be hidden   PMHx, SurgHx, SocialHx, FamHx, Medications, and Allergies were reviewed in the Visit Navigator and updated as appropriate.   Patient Active Problem List   Diagnosis Date Noted  . Nocturnal leg cramps 10/26/2018  . Tachycardia 10/26/2018  . Bilateral lower extremity edema 10/26/2018  . Low serum ferritin level 10/26/2018  . Abdominal pain 08/17/2018  . Nephrolithiasis   . Bleeding diathesis (HCC) 10/13/2017  . Depressive disorder 07/09/2017  . Dysmenorrhea 07/09/2017  . Menorrhagia 07/09/2017  . Reduced libido, due to Lexapro 07/09/2017  . History of cesarean section 07/09/2017  . Binge eating disorder 04/23/2017  . Morbid obesity (HCC) 04/23/2017  . 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 Pes planus 07/16/2016  . Hallux varus, acquired, right 03/18/2016  . Iron deficiency anemia 02/22/2015  . 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 Anxiety 04/10/2010   Social History   Tobacco Use  . Smoking status: Never Smoker  . Smokeless tobacco: Never Used  Substance Use Topics  . Alcohol use: No    Comment: rare/socially  . Drug use: No   Current Medications and Allergies   Current Outpatient Medications:  .  acetaminophen (TYLENOL) 500 MG tablet, Take 1,000 mg by mouth every 6 (six) hours as needed  for moderate pain. , Disp: , Rfl:  .  CALCIUM-MAGNESIUM-ZINC PO, Take 3 tablets by mouth daily., Disp: , Rfl:  .  diclofenac sodium (VOLTAREN) 1 % GEL, Apply topically to affected area qid (Patient taking differently: Apply 2 g topically 4 (four) times daily as needed (pain). Apply topically to affected area qid prn for pain), Disp: 100 g, Rfl: 1 .  escitalopram (LEXAPRO) 20 MG tablet, Take 1 tablet (20 mg total) by mouth daily., Disp: 90 tablet, Rfl: 2 .  gabapentin (NEURONTIN) 100 MG capsule, 100mg  in the morning and 200 mg at night, Disp: 90 capsule, Rfl: 2 .  linaclotide (LINZESS) 72 MCG capsule, Take 1 capsule (72 mcg total) by mouth daily., Disp: 30 capsule, Rfl: 5 .  Lisdexamfetamine Dimesylate (VYVANSE) 60 MG CHEW, Chew 60 mg by mouth daily., Disp: 30 tablet, Rfl: 0 .  LORazepam (ATIVAN) 1 MG tablet, Take 1 tablet (1 mg total) by mouth 2 (two) times daily as needed for anxiety., Disp: 60 tablet, Rfl: 0 .  metoCLOPramide (REGLAN) 10 MG tablet, Take 1 tablet (10 mg total) by mouth every 6 (six) hours as needed for nausea (nausea/headache)., Disp: 10 tablet, Rfl: 0 .  Multiple Vitamins-Minerals (MULTIVITAMIN ADULT EXTRA C PO), Take 1 tablet by mouth daily. , Disp: , Rfl:  .  ondansetron (ZOFRAN ODT) 4 MG disintegrating tablet, Take 1 tablet (4 mg total) by mouth every 8 (eight) hours as needed  for nausea or vomiting., Disp: 20 tablet, Rfl: 0 .  pantoprazole (PROTONIX) 40 MG tablet, Take 1 tablet (40 mg total) by mouth daily., Disp: 30 tablet, Rfl: 3 .  valACYclovir (VALTREX) 500 MG tablet, 2 po in am and 2 po in pm x 1 day., Disp: 5 tablet, Rfl: 3  Current Facility-Administered Medications:  .  0.9 %  sodium chloride infusion, 500 mL, Intravenous, Once, Rachael Fee, MD   Allergies  Allergen Reactions  . Nsaids     Gastric bypass   Review of Systems   Pertinent items are noted in the HPI. Otherwise, a complete ROS is negative.  Vitals  There were no vitals filed for this visit.     There is no height or weight on file to calculate BMI.  Physical Exam   Physical Exam Vitals signs and nursing note reviewed.  HENT:     Head: Normocephalic and atraumatic.  Eyes:     Pupils: Pupils are equal, round, and reactive to light.  Neck:     Musculoskeletal: Normal range of motion and neck supple.  Cardiovascular:     Rate and Rhythm: Normal rate and regular rhythm.     Heart sounds: Normal heart sounds.  Pulmonary:     Effort: Pulmonary effort is normal.  Abdominal:     Palpations: Abdomen is soft.  Skin:    General: Skin is warm.  Psychiatric:        Behavior: Behavior normal.     Results for orders placed or performed in visit on 10/19/18  Brain natriuretic peptide  Result Value Ref Range   Pro B Natriuretic peptide (BNP) 7.0 0.0 - 100.0 pg/mL  Magnesium  Result Value Ref Range   Magnesium 2.0 1.5 - 2.5 mg/dL  TSH  Result Value Ref Range   TSH 2.25 0.35 - 4.50 uIU/mL  Iron, TIBC and Ferritin Panel  Result Value Ref Range   Iron 74 40 - 190 mcg/dL   TIBC 161 (H) 096 - 045 mcg/dL (calc)   %SAT 16 16 - 45 % (calc)   Ferritin 9 (L) 16 - 154 ng/mL  Comprehensive metabolic panel  Result Value Ref Range   Sodium 138 135 - 145 mEq/L   Potassium 4.9 3.5 - 5.1 mEq/L   Chloride 104 96 - 112 mEq/L   CO2 26 19 - 32 mEq/L   Glucose, Bld 82 70 - 99 mg/dL   BUN 19 6 - 23 mg/dL   Creatinine, Ser 4.09 0.40 - 1.20 mg/dL   Total Bilirubin 0.3 0.2 - 1.2 mg/dL   Alkaline Phosphatase 199 (H) 39 - 117 U/L   AST 17 0 - 37 U/L   ALT 15 0 - 35 U/L   Total Protein 7.0 6.0 - 8.3 g/dL   Albumin 4.3 3.5 - 5.2 g/dL   Calcium 9.0 8.4 - 81.1 mg/dL   GFR 914.78 >29.56 mL/min  CBC with Differential/Platelet  Result Value Ref Range   WBC 7.0 4.0 - 10.5 K/uL   RBC 4.72 3.87 - 5.11 Mil/uL   Hemoglobin 12.7 12.0 - 15.0 g/dL   HCT 21.3 08.6 - 57.8 %   MCV 80.2 78.0 - 100.0 fl   MCHC 33.6 30.0 - 36.0 g/dL   RDW 46.9 (H) 62.9 - 52.8 %   Platelets 355.0 150.0 - 400.0 K/uL    Neutrophils Relative % 64.9 43.0 - 77.0 %   Lymphocytes Relative 26.5 12.0 - 46.0 %   Monocytes Relative 6.6 3.0 - 12.0 %  Eosinophils Relative 0.7 0.0 - 5.0 %   Basophils Relative 1.3 0.0 - 3.0 %   Neutro Abs 4.5 1.4 - 7.7 K/uL   Lymphs Abs 1.9 0.7 - 4.0 K/uL   Monocytes Absolute 0.5 0.1 - 1.0 K/uL   Eosinophils Absolute 0.1 0.0 - 0.7 K/uL   Basophils Absolute 0.1 0.0 - 0.1 K/uL   Assessment and Plan   Diagnoses and all orders for this visit:  Tachycardia -     Ambulatory referral to Cardiology  Bilateral lower extremity edema  Nocturnal leg cramps  Low serum ferritin level   . Orders and follow up as documented in EpicCare, reviewed diet, exercise and weight control, cardiovascular risk and specific lipid/LDL goals reviewed, reviewed medications and side effects in detail.  . Reviewed expectations re: course of current medical issues. . Outlined signs and symptoms indicating need for more acute intervention. . Patient verbalized understanding and all questions were answered. . Patient received an After Visit Summary.  Helane RimaErica Martine Trageser, DO [place], [facility] 11/01/2018

## 2018-11-01 ENCOUNTER — Encounter: Payer: Self-pay | Admitting: Family Medicine

## 2018-11-01 NOTE — Addendum Note (Signed)
Addended by: Donnamarie Poag on: 11/01/2018 10:45 AM   Modules accepted: Orders

## 2018-11-01 NOTE — Addendum Note (Signed)
Addended by: Donnamarie Poag on: 11/01/2018 10:49 AM   Modules accepted: Orders

## 2018-11-01 NOTE — Addendum Note (Signed)
Addended by: Helane Rima R on: 11/01/2018 09:28 AM   Modules accepted: Orders

## 2018-11-02 ENCOUNTER — Other Ambulatory Visit: Payer: Self-pay

## 2018-11-02 ENCOUNTER — Other Ambulatory Visit (INDEPENDENT_AMBULATORY_CARE_PROVIDER_SITE_OTHER): Payer: No Typology Code available for payment source

## 2018-11-02 DIAGNOSIS — R000 Tachycardia, unspecified: Secondary | ICD-10-CM

## 2018-11-02 DIAGNOSIS — R748 Abnormal levels of other serum enzymes: Secondary | ICD-10-CM

## 2018-11-02 DIAGNOSIS — R600 Localized edema: Secondary | ICD-10-CM

## 2018-11-02 LAB — HEPATIC FUNCTION PANEL
ALT: 20 U/L (ref 0–35)
AST: 16 U/L (ref 0–37)
Albumin: 4.1 g/dL (ref 3.5–5.2)
Alkaline Phosphatase: 200 U/L — ABNORMAL HIGH (ref 39–117)
Bilirubin, Direct: 0 mg/dL (ref 0.0–0.3)
Total Bilirubin: 0.2 mg/dL (ref 0.2–1.2)
Total Protein: 6.7 g/dL (ref 6.0–8.3)

## 2018-11-02 LAB — VITAMIN D 25 HYDROXY (VIT D DEFICIENCY, FRACTURES): VITD: 16.17 ng/mL — ABNORMAL LOW (ref 30.00–100.00)

## 2018-11-02 LAB — GAMMA GT: GGT: 13 U/L (ref 7–51)

## 2018-11-03 LAB — PTH, INTACT AND CALCIUM
Calcium: 9.4 mg/dL (ref 8.6–10.2)
PTH: 41 pg/mL (ref 14–64)

## 2018-11-10 ENCOUNTER — Encounter: Payer: Self-pay | Admitting: Family Medicine

## 2018-11-10 NOTE — Telephone Encounter (Signed)
Called patient app made pt informed.

## 2018-11-11 ENCOUNTER — Ambulatory Visit (INDEPENDENT_AMBULATORY_CARE_PROVIDER_SITE_OTHER): Payer: No Typology Code available for payment source | Admitting: Family Medicine

## 2018-11-11 ENCOUNTER — Other Ambulatory Visit: Payer: Self-pay

## 2018-11-11 ENCOUNTER — Encounter: Payer: Self-pay | Admitting: Family Medicine

## 2018-11-11 VITALS — Ht 64.0 in | Wt 239.0 lb

## 2018-11-11 DIAGNOSIS — R748 Abnormal levels of other serum enzymes: Secondary | ICD-10-CM | POA: Diagnosis not present

## 2018-11-11 DIAGNOSIS — M79604 Pain in right leg: Secondary | ICD-10-CM

## 2018-11-11 DIAGNOSIS — Z9884 Bariatric surgery status: Secondary | ICD-10-CM | POA: Diagnosis not present

## 2018-11-11 DIAGNOSIS — M79605 Pain in left leg: Secondary | ICD-10-CM | POA: Diagnosis not present

## 2018-11-11 MED ORDER — OXYCODONE-ACETAMINOPHEN 5-325 MG PO TABS: 1.0000 | ORAL_TABLET | ORAL | 0 refills | Status: DC | PRN

## 2018-11-11 NOTE — Progress Notes (Signed)
Virtual Visit via Video   Due to the COVID-19 pandemic, this visit was completed with telemedicine (audio/video) technology to reduce patient and provider exposure as well as to preserve personal protective equipment.   I connected with [name] [name] by a video enabled telemedicine application and verified that I am speaking with the correct person using two identifiers. Location patient: Home Location provider: [employer], Office Persons participating in the virtual visit: AFIYA FERREBEE, Helane Rima, DO Barnie Mort, CMA acting as scribe for Dr. Helane Rima.   I discussed the limitations of evaluation and management by telemedicine and the availability of in person appointments. The patient expressed understanding and agreed to proceed.  Care Team   Patient Care Team: Helane Rima, DO as PCP - General (Family Medicine) Jaymes Graff, MD as Referring Physician (Obstetrics and Gynecology) Himmelrich, Loree Fee, RD (Inactive) as Dietitian Jae Dire)  Subjective:   HPI:  From my chart message on 11/10/2018: My leg pain has not gotten any better. I have been taking 300-600mg  of Gabapentin, Tylenol, used Cat's Claw (menthol rub), and soaking in Epson salt. I have been taking  of Magnesium. I have changed the shoes I normally wear. I can hardly stay standing at work for longer than 10-15 minutes before needing a break.  I have an appointment with you on Tuesday (I think), but I can't last that long without some relief! Any recommendations?  Review of Systems  Constitutional: Negative for chills and fever.  HENT: Negative for hearing loss and tinnitus.   Eyes: Negative for blurred vision and double vision.  Respiratory: Negative for cough.   Cardiovascular: Negative for chest pain and palpitations.  Gastrointestinal: Negative for nausea and vomiting.  Genitourinary: Negative for dysuria and urgency.  Musculoskeletal: Negative for myalgias and neck pain.  Skin:  Negative for rash.  Neurological: Negative for dizziness and headaches.  Endo/Heme/Allergies: Does not bruise/bleed easily.  Psychiatric/Behavioral: Negative for depression and suicidal ideas.    Patient Active Problem List   Diagnosis Date Noted  . Abnormal serum level of alkaline phosphatase 11/21/2018  . Pain in both lower extremities 11/21/2018  . Nocturnal leg cramps 10/26/2018  . Tachycardia 10/26/2018  . Bilateral lower extremity edema 10/26/2018  . Low serum ferritin level 10/26/2018  . Abdominal pain 08/17/2018  . Nephrolithiasis   . Bleeding diathesis (HCC) 10/13/2017  . Depressive disorder 07/09/2017  . Dysmenorrhea 07/09/2017  . Menorrhagia 07/09/2017  . Reduced libido, due to Lexapro 07/09/2017  . History of cesarean section 07/09/2017  . Binge eating disorder 04/23/2017  . Morbid obesity (HCC) 04/23/2017  . 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 Pes planus 07/16/2016  . Hallux varus, acquired, right 03/18/2016  . Iron deficiency anemia 02/22/2015  . 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 Anxiety 04/10/2010    Social History   Tobacco Use  . Smoking status: Never Smoker  . Smokeless tobacco: Never Used  Substance Use Topics  . Alcohol use: No    Comment: rare/socially    Current Outpatient Medications:  .  acetaminophen (TYLENOL) 500 MG tablet, Take 1,000 mg by mouth every 6 (six) hours as needed for moderate pain. , Disp: , Rfl:  .  CALCIUM-MAGNESIUM-ZINC PO, Take 3 tablets by mouth daily., Disp: , Rfl:  .  diclofenac sodium (VOLTAREN) 1 % GEL, Apply topically to affected area qid (Patient taking differently: Apply 2 g topically 4 (four) times daily as needed (pain). Apply topically to affected area qid prn for pain),  Disp: 100 g, Rfl: 1 .  escitalopram (LEXAPRO) 20 MG tablet, Take 1 tablet (20 mg total) by mouth daily., Disp: 90 tablet, Rfl: 2 .  gabapentin (NEURONTIN) 100 MG capsule, 100mg  in the morning and 200 mg at night, Disp: 90  capsule, Rfl: 2 .  linaclotide (LINZESS) 72 MCG capsule, Take 1 capsule (72 mcg total) by mouth daily., Disp: 30 capsule, Rfl: 5 .  Lisdexamfetamine Dimesylate (VYVANSE) 60 MG CHEW, Chew 60 mg by mouth daily., Disp: 30 tablet, Rfl: 0 .  LORazepam (ATIVAN) 1 MG tablet, Take 1 tablet (1 mg total) by mouth 2 (two) times daily as needed for anxiety., Disp: 60 tablet, Rfl: 0 .  metoCLOPramide (REGLAN) 10 MG tablet, Take 1 tablet (10 mg total) by mouth every 6 (six) hours as needed for nausea (nausea/headache)., Disp: 10 tablet, Rfl: 0 .  Multiple Vitamins-Minerals (MULTIVITAMIN ADULT EXTRA C PO), Take 1 tablet by mouth daily. , Disp: , Rfl:  .  ondansetron (ZOFRAN ODT) 4 MG disintegrating tablet, Take 1 tablet (4 mg total) by mouth every 8 (eight) hours as needed for nausea or vomiting., Disp: 20 tablet, Rfl: 0 .  pantoprazole (PROTONIX) 40 MG tablet, Take 1 tablet (40 mg total) by mouth daily., Disp: 30 tablet, Rfl: 3 .  valACYclovir (VALTREX) 500 MG tablet, 2 po in am and 2 po in pm x 1 day., Disp: 5 tablet, Rfl: 3 .  oxyCODONE-acetaminophen (PERCOCET/ROXICET) 5-325 MG tablet, Take 1-2 tablets by mouth every 4 (four) hours as needed for severe pain., Disp: 24 tablet, Rfl: 0  Current Facility-Administered Medications:  .  0.9 %  sodium chloride infusion, 500 mL, Intravenous, Once, Rachael Fee, MD  Allergies  Allergen Reactions  . Nsaids     Gastric bypass   Objective:   VITALS: Per patient if applicable, see vitals. GENERAL: Alert, appears well and in no acute distress. HEENT: Atraumatic, conjunctiva clear, no obvious abnormalities on inspection of external nose and ears. NECK: Normal movements of the head and neck. CARDIOPULMONARY: No increased WOB. Speaking in clear sentences. I:E ratio WNL.  MS: Moves all visible extremities without noticeable abnormality. PSYCH: Pleasant and cooperative, well-groomed. Speech normal rate and rhythm. Affect is appropriate. Insight and judgement are  appropriate. Attention is focused, linear, and appropriate.  NEURO: CN grossly intact. Oriented as arrived to appointment on time with no prompting. Moves both UE equally.  SKIN: No obvious lesions, wounds, erythema, or cyanosis noted on face or hands.  Depression screen [facility] 2/9 08/17/2018 06/01/2018 04/20/2018  Decreased Interest 1 1 2   Down, Depressed, Hopeless 0 2 2  PHQ - 2 Score 1 3 4   Altered sleeping 3 3 3   Tired, decreased energy 1 2 2   Change in appetite 3 2 2   Feeling bad or failure about yourself  0 0 0  Trouble concentrating 0 1 1  Moving slowly or fidgety/restless 0 0 1  Suicidal thoughts 0 0 0  PHQ-9 Score 8 11 13   Difficult doing work/chores - Very difficult Very difficult  Some recent data might be hidden   Assessment and Plan:   Brigid was seen today for follow-up.  Diagnoses and all orders for this visit:  Pain in both lower extremities -     Ambulatory referral to Orthopedic Surgery -     oxyCODONE-acetaminophen (PERCOCET/ROXICET) 5-325 MG tablet; Take 1-2 tablets by mouth every 4 (four) hours as needed for severe pain.  History of Roux-en-Y gastric bypass  Abnormal serum level of alkaline phosphatase    .  COVID-19 Education: The signs and symptoms of COVID-19 were discussed with the patient and how to seek care for testing if needed. The importance of social distancing was discussed today. . Reviewed expectations re: course of current medical issues. . Discussed self-management of symptoms. . Outlined signs and symptoms indicating need for more acute intervention. . Patient verbalized understanding and all questions were answered. Marland Kitchen. Health Maintenance issues including appropriate healthy diet, exercise, and smoking avoidance were discussed with patient. . See orders for this visit as documented in the electronic medical record.  Helane RimaErica Yuvia Plant, DO  Records requested if needed. Time spent: 15  minutes, of which >50% was spent in obtaining information  about her symptoms, reviewing her previous labs, evaluations, and treatments, counseling her about her condition (please see the discussed topics above), and developing a plan to further investigate it; she had a number of questions which I addressed.

## 2018-11-16 ENCOUNTER — Ambulatory Visit: Payer: No Typology Code available for payment source | Admitting: Family Medicine

## 2018-11-17 ENCOUNTER — Encounter: Payer: Self-pay | Admitting: Family Medicine

## 2018-11-17 MED FILL — ESCITALOPRAM 20 MG TABLET: 20 | 90 days supply | Qty: 90 | Fill #2

## 2018-11-17 MED FILL — GABAPENTIN 100 MG CAPSULE: 100 | 90 days supply | Qty: 90 | Fill #1

## 2018-11-18 ENCOUNTER — Telehealth: Payer: Self-pay | Admitting: Internal Medicine

## 2018-11-18 NOTE — Telephone Encounter (Signed)
LMTCB. NEW PATIENT.  Per Dr. Jacques Navy - Please schedule virtual appt with her.  We have her EKG and records in Epic.

## 2018-11-21 ENCOUNTER — Encounter: Payer: Self-pay | Admitting: Family Medicine

## 2018-11-21 DIAGNOSIS — R748 Abnormal levels of other serum enzymes: Secondary | ICD-10-CM | POA: Insufficient documentation

## 2018-11-21 DIAGNOSIS — M79604 Pain in right leg: Secondary | ICD-10-CM | POA: Insufficient documentation

## 2018-11-21 DIAGNOSIS — M79605 Pain in left leg: Secondary | ICD-10-CM | POA: Insufficient documentation

## 2018-11-24 ENCOUNTER — Ambulatory Visit (INDEPENDENT_AMBULATORY_CARE_PROVIDER_SITE_OTHER): Payer: No Typology Code available for payment source | Admitting: Family Medicine

## 2018-11-24 ENCOUNTER — Encounter: Payer: Self-pay | Admitting: Family Medicine

## 2018-11-24 ENCOUNTER — Ambulatory Visit: Payer: Self-pay

## 2018-11-24 ENCOUNTER — Other Ambulatory Visit: Payer: Self-pay

## 2018-11-24 ENCOUNTER — Ambulatory Visit (INDEPENDENT_AMBULATORY_CARE_PROVIDER_SITE_OTHER): Payer: No Typology Code available for payment source

## 2018-11-24 DIAGNOSIS — M79604 Pain in right leg: Secondary | ICD-10-CM

## 2018-11-24 DIAGNOSIS — M79605 Pain in left leg: Secondary | ICD-10-CM

## 2018-11-24 DIAGNOSIS — R748 Abnormal levels of other serum enzymes: Secondary | ICD-10-CM

## 2018-11-24 DIAGNOSIS — E559 Vitamin D deficiency, unspecified: Secondary | ICD-10-CM

## 2018-11-24 DIAGNOSIS — D509 Iron deficiency anemia, unspecified: Secondary | ICD-10-CM

## 2018-11-24 NOTE — Progress Notes (Signed)
Office Visit Note   Patient: [name] [name]           Date of Birth: 06-07-1986           MRN: [id number] Visit Date: 11/24/2018 Requested by: Briscoe Deutscher, [address] [place], [place] [address] PCP: Briscoe Deutscher, DO  Subjective: Chief Complaint  Patient presents with  . pain bil LEs x 3-4 mos, worsening    HPI: She is a 33 year old seen the request of [name] [name] for left greater than right leg pain.  Symptoms first started about 3 months ago with no injury.  Her husband started noticing severe restless legs symptoms with shaking at night.  She then started noticing steadily increasing pain in the midportion of her shin on both side, but especially on the left.  The right leg bothers her more during the day, and the left one at night when she is off her feet.  It aches and throbs like a toothache sometimes.  She has tried a variety of over-the-counter remedies with no improvement.  Denies any knee pain, denies any other joint pain, denies any back pain.  She is never had problems like this before.  She never had restless leg symptoms before.  She has had a good laboratory work-up showing pretty significant iron deficiency anemia, moderate to severe vitamin D deficiency, and a persistently elevated alkaline phosphatase level but normal GGT level.  There is a family history of colon cancer in her father who died at age 22.  Patient has already had a colonoscopy which was normal.  Patient has had abdominal scans showing no significant gallbladder disease.               ROS: Denies fevers, chills, night sweats, unintentional weight change.  Denies rash on her skin.  All other systems were reviewed and are negative.  Objective: Vital Signs: There were no vitals taken for this visit.  Physical Exam:  General:  Alert and oriented, in no acute distress. Pulm:  Breathing unlabored. Psy:  Normal mood, congruent affect. Skin: No visible rash. Lower legs: She has no joint  effusion in the knees, no tenderness to palpation around the knees.  Both legs have diffuse tenderness along the anterior tibia most pronounced in the mid tibia on the left.  There is slight warmth to the touch over this area compared to the rest of her skin.  She has no pain with ankle dorsiflexion, eversion, inversion, or plantarflexion against resistance and her lower extremity strength is normal.  Imaging: X-rays left tibia/fibula: Both of her tibial bones look symmetric, I do not see a definite cortical defect or focal thickening.  X-rays right tibia/fibula:  Both of her tibial bones look symmetric, I do not see a definite cortical defect or focal thickening.     Assessment & Plan: 1.  Bilateral leg pain, left greater than right, etiology uncertain.  Elevated alkaline phosphatase level with normal GGT is concerning for bone origin.  Could be pain related to vitamin D deficiency and iron deficiency anemia, but would like to order MRI scan of her most symptomatic tibia to further evaluate. -MRI ordered. -We will get her started on vitamin D3, 10,000 IU daily along with vitamin K2 100 mcg daily with a target vitamin D level of 50-80.  Recheck levels in about 3 to 6 months. -Iron deficiency is currently being treated.     Procedures: No procedures performed  No notes on file  PMFS History: Patient Active Problem List   Diagnosis Date Noted  . Abnormal serum level of alkaline phosphatase 11/21/2018  . Pain in both lower extremities 11/21/2018  . Nocturnal leg cramps 10/26/2018  . Tachycardia 10/26/2018  . Bilateral lower extremity edema 10/26/2018  . Low serum ferritin level 10/26/2018  . Abdominal pain 08/17/2018  . Nephrolithiasis   . Bleeding diathesis (HCC) 10/13/2017  . Depressive disorder 07/09/2017  . Dysmenorrhea 07/09/2017  . Menorrhagia 07/09/2017  . Reduced libido, due to Lexapro 07/09/2017  . History of cesarean section 07/09/2017  . Binge eating disorder  04/23/2017  . Morbid obesity (HCC) 04/23/2017  . 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 Pes planus 07/16/2016  . Hallux varus, acquired, right 03/18/2016  . Iron deficiency anemia 02/22/2015  . 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 Anxiety 04/10/2010   Past Medical History:  Diagnosis Date  . Anemia   . Anxiety   . DEPRESSION   . GERD (gastroesophageal reflux disease)   . Hypothyroidism    Hx of, normalized TSH during pregnancy 2011  . Infertility, female   . Migraines   . Morbid obesity (HCC)    s/p RY 08/2012 - start weight 290 pounds  . Nephrolithiasis   . PCOS (polycystic ovarian syndrome)   . PONV (postoperative nausea and vomiting)   . Pregnancy induced hypertension     Family History  Problem Relation Age of Onset  . Hyperlipidemia Father   . Hypertension Father   . Kidney disease Father   . Colon cancer Father [id number]  . Hypertension Mother   . Hypertension Maternal Grandmother   . Uterine cancer Maternal Grandmother [phone]  . Diabetes Maternal Grandfather     Past Surgical History:  Procedure Laterality Date  . Quintella ReichertIKEN OSTEOTOMY Left 06/24/2017   Procedure: Ralene BatheAIKEN OSTEOTOMY;  Surgeon: Vivi BarrackWagoner, Matthew R, DPM;  Location: [facility];  Service: Podiatry;  Laterality: Left;  . BUNIONECTOMY Left 06/24/2017   Procedure: Anthoney HaradaALSTON BUNIONECTOMY;  Surgeon: Vivi BarrackWagoner, Matthew R, DPM;  Location: [facility];  Service: Podiatry;  Laterality: Left;  . CESAREAN SECTION     x 2  . FOOT SURGERY    . 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   Procedure: LAPAROSCOPIC ROUX-EN-Y GASTRIC;  Surgeon: Atilano InaEric M Wilson, MD;  Location: [facility];  Service: General;  Laterality: N/A;  laparoscopic roux-en-y gastric bypass  . LITHOTRIPSY Left   . TONSILLECTOMY    . TUBAL LIGATION    . UPPER GI ENDOSCOPY  08/24/2012   Procedure: UPPER GI ENDOSCOPY;  Surgeon: Atilano InaEric M Wilson, MD;  Location: [facility];  Service: General;;  . WISDOM TOOTH EXTRACTION     Social History    Occupational History  . Not on file  Tobacco Use  . Smoking status: Never Smoker  . Smokeless tobacco: Never Used  Substance and Sexual Activity  . Alcohol use: No    Comment: rare/socially  . Drug use: No  . Sexual activity: Yes    Birth control/protection: Other-see comments    Comment: tubal ligation

## 2018-11-26 ENCOUNTER — Other Ambulatory Visit: Payer: Self-pay | Admitting: Family Medicine

## 2018-11-26 DIAGNOSIS — F4321 Adjustment disorder with depressed mood: Secondary | ICD-10-CM

## 2018-11-26 MED ORDER — LORAZEPAM 1 MG PO TABS: 1.0000 mg | ORAL_TABLET | Freq: Two times a day (BID) | ORAL | 0 refills | Status: DC | PRN

## 2018-11-26 MED FILL — LORazepam 1 MG TABS: 1 | 30 days supply | Qty: 60 | Fill #0

## 2018-11-26 MED FILL — ESCITALOPRAM 20 MG TABLET: 20 | 90 days supply | Qty: 90 | Fill #2

## 2018-11-26 MED FILL — GABAPENTIN 100 MG CAPSULE: 100 | 90 days supply | Qty: 90 | Fill #1

## 2018-11-26 NOTE — Telephone Encounter (Signed)
Pt requesting refill from pharmacy, last ov 5/28. Rx last fill was 5/11, # 30.

## 2018-11-26 NOTE — Telephone Encounter (Signed)
Last OV: 11/11/18 Next OV: None scheduled at this time PMP website checked: Last filled 10/25/18

## 2018-11-30 ENCOUNTER — Encounter (HOSPITAL_BASED_OUTPATIENT_CLINIC_OR_DEPARTMENT_OTHER): Payer: Self-pay | Admitting: Emergency Medicine

## 2018-11-30 ENCOUNTER — Other Ambulatory Visit: Payer: Self-pay

## 2018-11-30 ENCOUNTER — Emergency Department (HOSPITAL_BASED_OUTPATIENT_CLINIC_OR_DEPARTMENT_OTHER): Payer: No Typology Code available for payment source

## 2018-11-30 ENCOUNTER — Emergency Department (HOSPITAL_BASED_OUTPATIENT_CLINIC_OR_DEPARTMENT_OTHER)
Admission: EM | Admit: 2018-11-30 | Discharge: 2018-11-30 | Disposition: A | Discharge status: Home / Self Care | Payer: No Typology Code available for payment source | Attending: Emergency Medicine | Admitting: Emergency Medicine

## 2018-11-30 DIAGNOSIS — Y9289 Other specified places as the place of occurrence of the external cause: Secondary | ICD-10-CM | POA: Insufficient documentation

## 2018-11-30 DIAGNOSIS — W5501XA Bitten by cat, initial encounter: Secondary | ICD-10-CM | POA: Insufficient documentation

## 2018-11-30 DIAGNOSIS — Y9389 Activity, other specified: Secondary | ICD-10-CM | POA: Insufficient documentation

## 2018-11-30 DIAGNOSIS — Y998 Other external cause status: Secondary | ICD-10-CM | POA: Diagnosis not present

## 2018-11-30 DIAGNOSIS — S61451A Open bite of right hand, initial encounter: Secondary | ICD-10-CM | POA: Insufficient documentation

## 2018-11-30 DIAGNOSIS — S61253A Open bite of left middle finger without damage to nail, initial encounter: Secondary | ICD-10-CM | POA: Insufficient documentation

## 2018-11-30 IMAGING — DX RIGHT HAND - COMPLETE 3+ VIEW
3 series · 3 of 3 positions shown · non-contrast
Comparison: [DATE]

CLINICAL DATA: Puncture wound proximal second from a CT question
retained tooth

EXAM:
RIGHT HAND - COMPLETE 3+ VIEW

[hand pa]
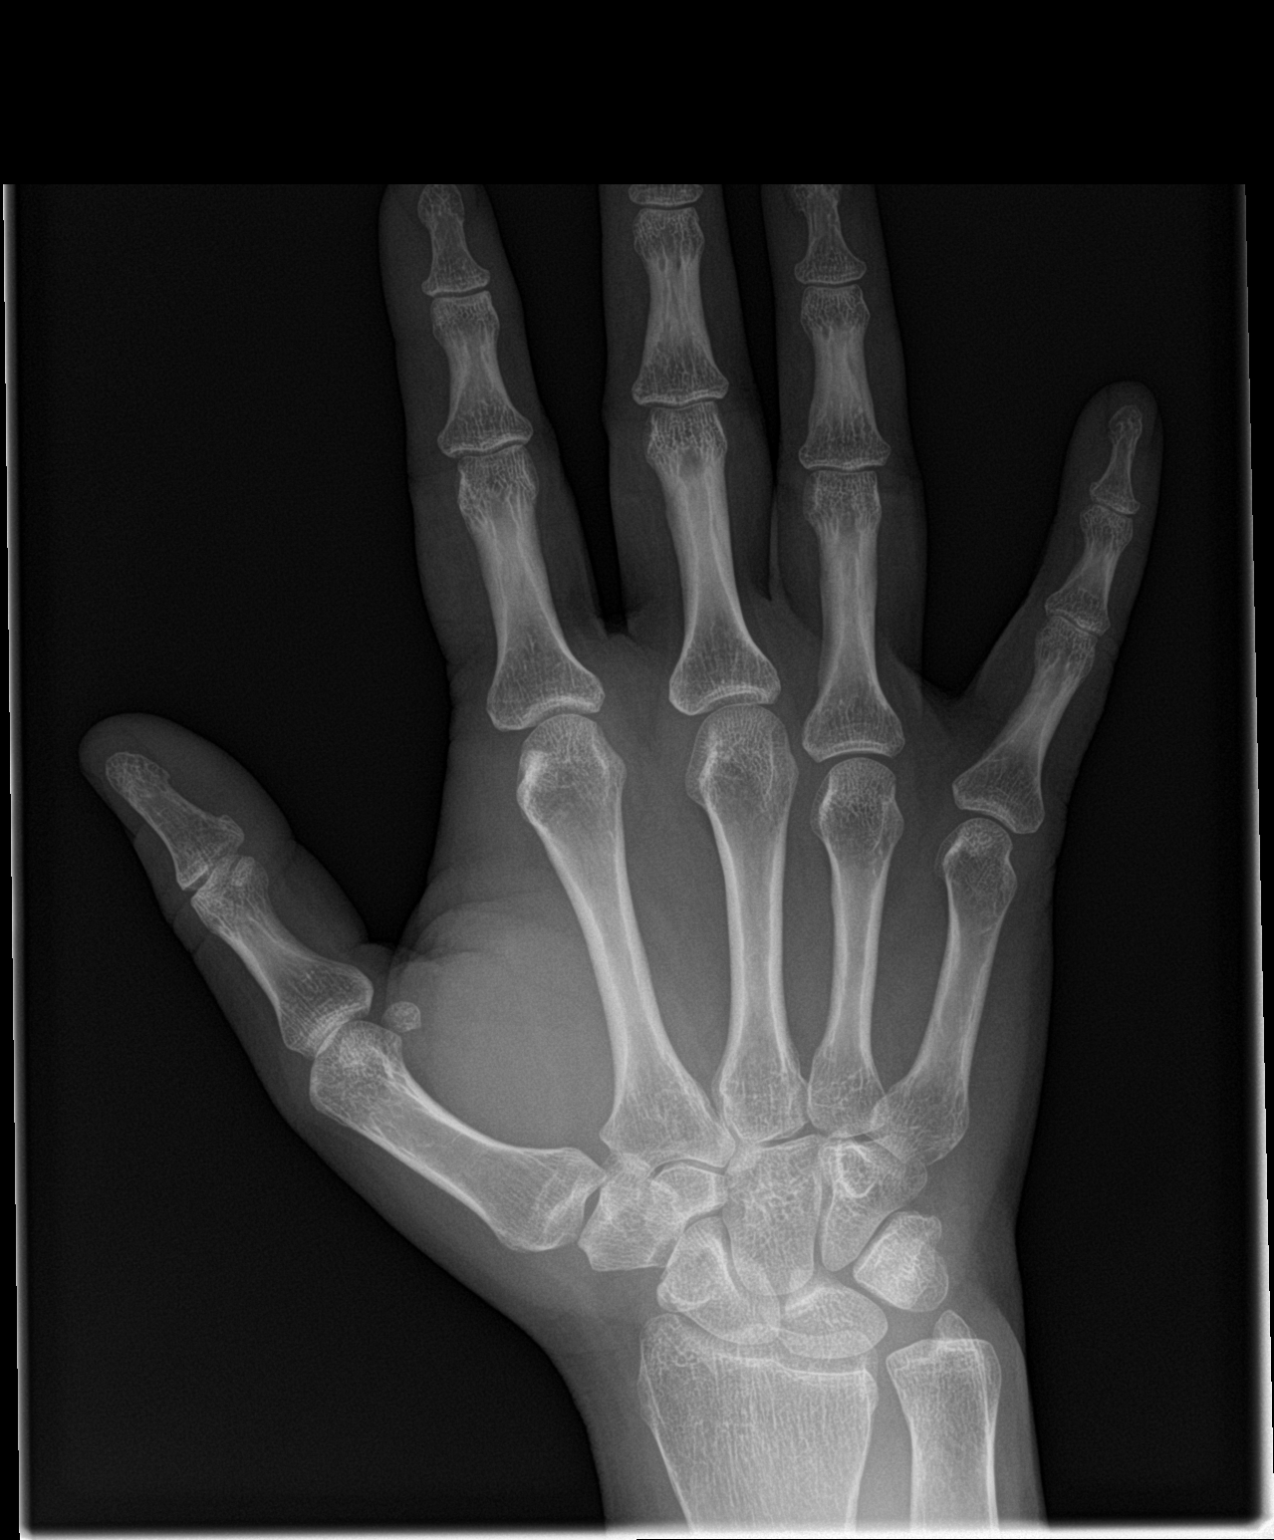

[hand obl]
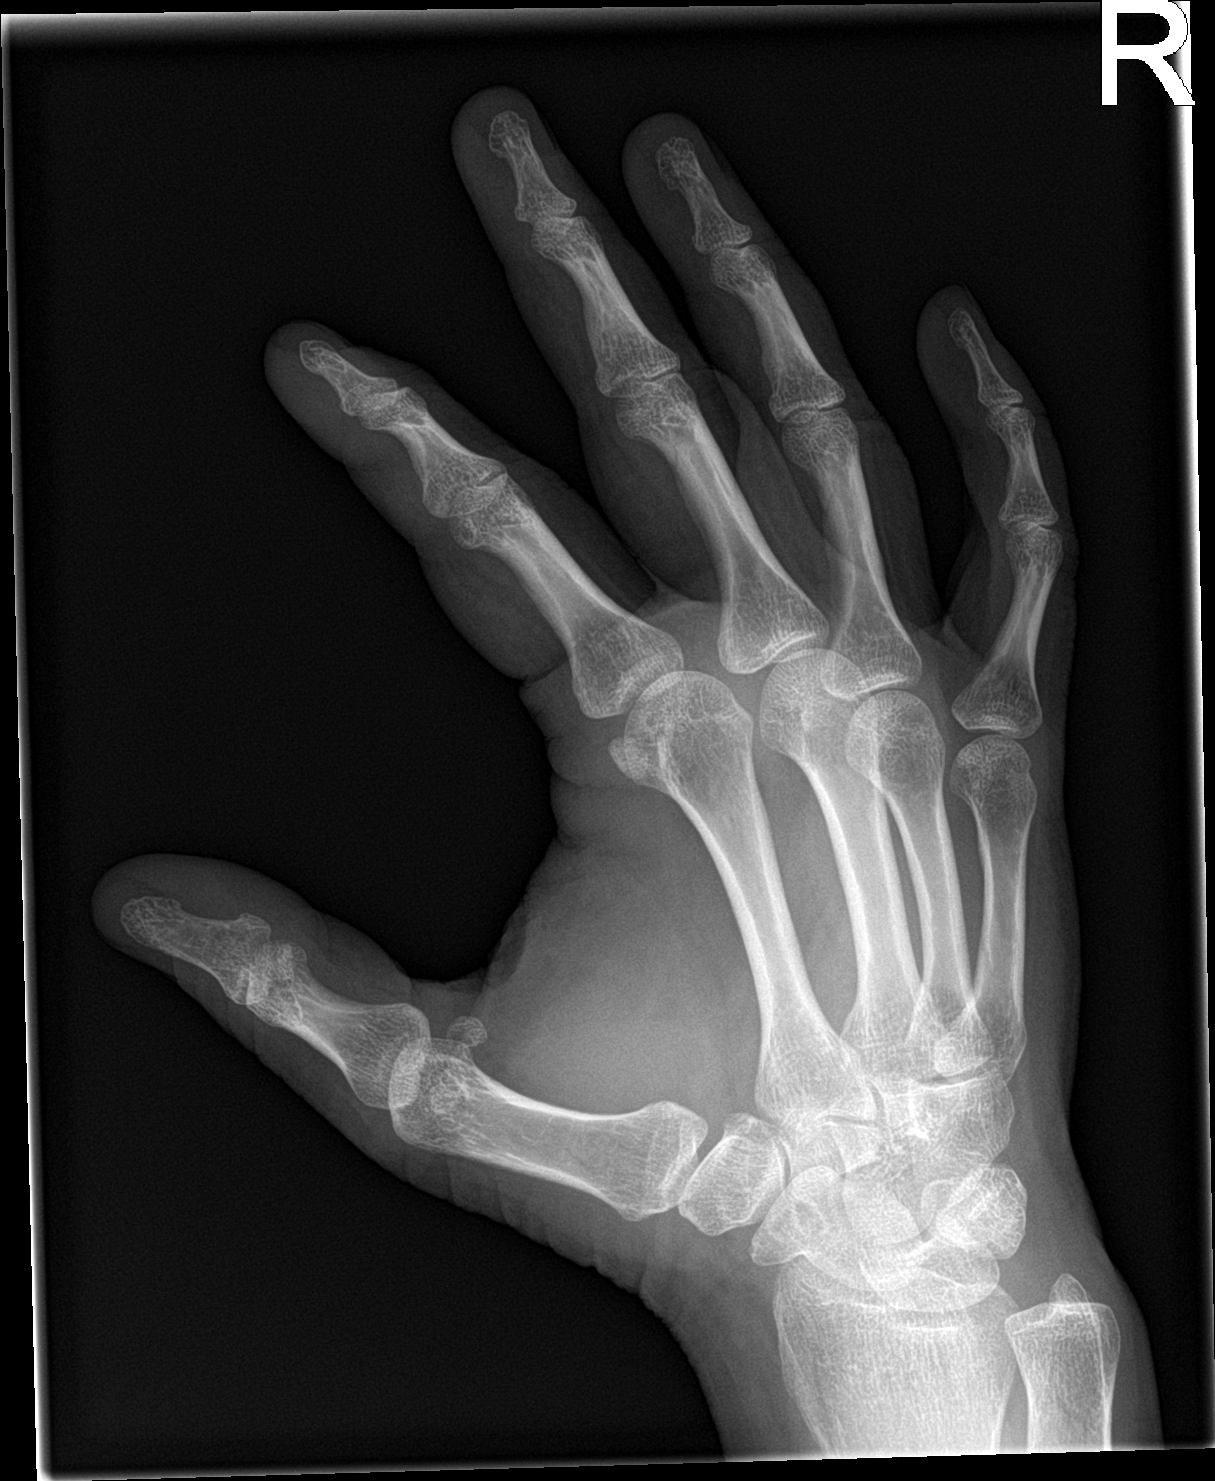

[hand lat]
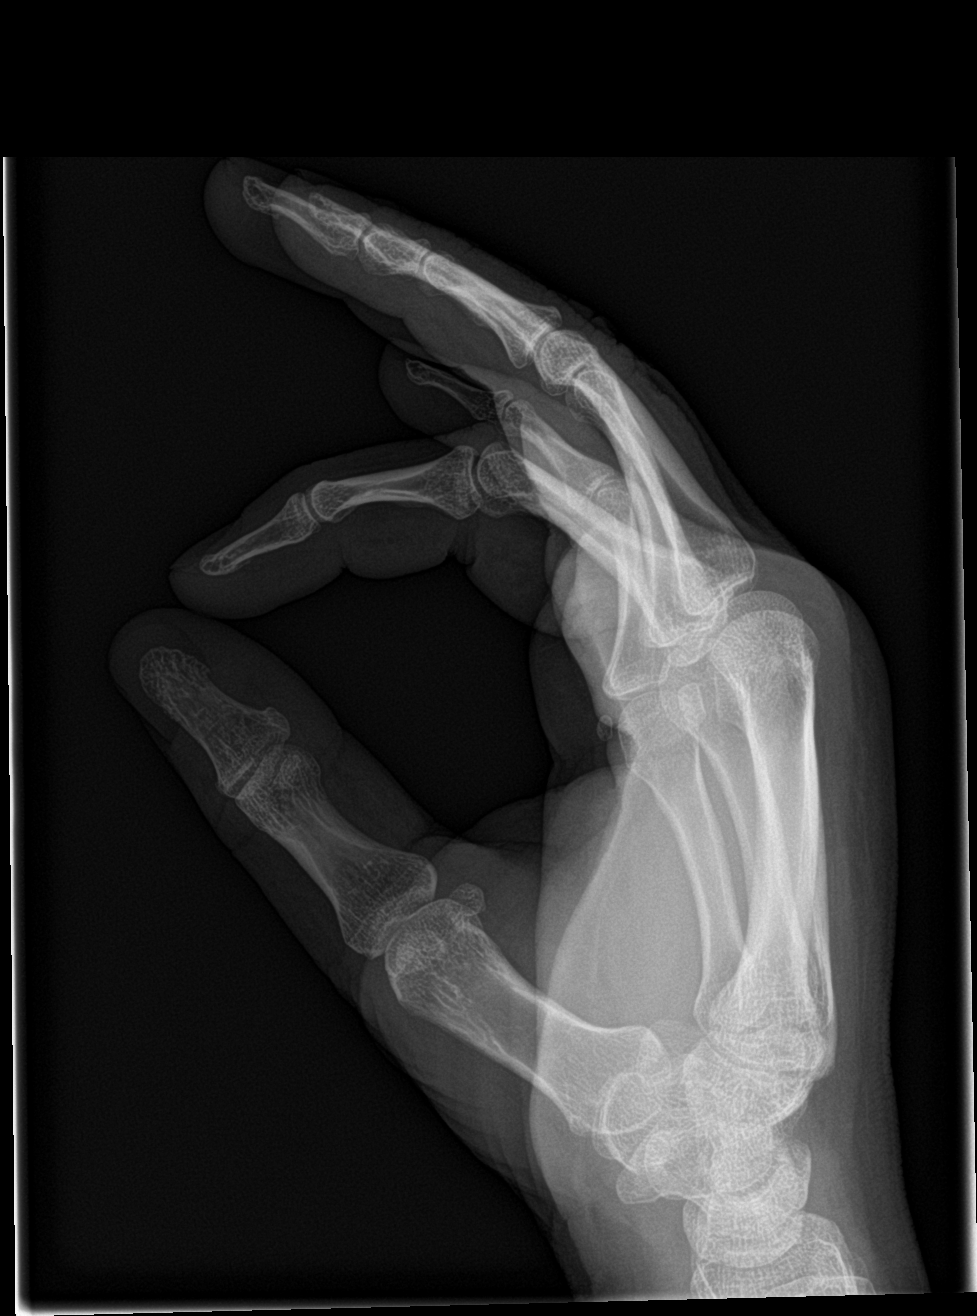

[3 of 3 positions shown; findings below may reference images not displayed]

FINDINGS: Osseous mineralization normal.

Joint spaces preserved.

No acute fracture, dislocation, or bone destruction.

No radiopaque foreign bodies.
IMPRESSION: Normal exam.

## 2018-11-30 MED ORDER — HYDROCODONE-ACETAMINOPHEN 5-325 MG PO TABS
1.0000 | ORAL_TABLET | Freq: Once | ORAL | Status: AC
  Administered 2018-11-30: 10:00:00 1 via ORAL
  Filled 2018-11-30: qty 1

## 2018-11-30 MED ORDER — AMOXICILLIN-POT CLAVULANATE 875-125 MG PO TABS: 1.0000 | ORAL_TABLET | Freq: Two times a day (BID) | ORAL | 0 refills | Status: AC

## 2018-11-30 MED FILL — AMOX-CLAV 875-125 MG TABLET: 875-125 | 7 days supply | Qty: 14 | Fill #0

## 2018-11-30 NOTE — Discharge Instructions (Addendum)
You were evaluated in the Emergency Department and after careful evaluation, we did not find any emergent condition requiring admission or further testing in the hospital.  Your symptoms today seem to be due to a cat bite.  Please use the antibiotics as directed.  Use Tylenol and cold compresses at home for the pain.  Please return to the Emergency Department if you experience any worsening of your condition.  We encourage you to follow up with a primary care provider.  Thank you for allowing [place] to be a part of your care.

## 2018-11-30 NOTE — ED Provider Notes (Signed)
[facility] Emergency Department Provider Note MRN:  [id number]  Arrival date & time: 11/30/18     Chief Complaint   Animal Bite   History of Present Illness   [name] [name] is a 33 y.o. year-old female with a history of gastric bypass presenting to the ED with chief complaint of animal bite.  Patient was introducing her cat to a new dog, the cat became frightened and bit patient's hand.  Puncture wounds to the right hand, abrasion to the left middle finger.  Continued pain and tenderness, described as throbbing throughout the night.  Denies fever, no other injuries.  Cat is vaccinated.  Review of Systems  A complete 10 system review of systems was obtained and all systems are negative except as noted in the HPI and PMH.   Patient's Health History    Past Medical History:  Diagnosis Date  . Anemia   . Anxiety   . DEPRESSION   . GERD (gastroesophageal reflux disease)   . Hypothyroidism    Hx of, normalized TSH during pregnancy 2011  . Infertility, female   . Migraines   . Morbid obesity ([place])    s/p RY 08/2012 - start weight 290 pounds  . Nephrolithiasis   . PCOS (polycystic ovarian syndrome)   . PONV (postoperative nausea and vomiting)   . Pregnancy induced hypertension     Past Surgical History:  Procedure Laterality Date  . Barbie Banner OSTEOTOMY Left 06/24/2017   Procedure: [employer];  Surgeon: Trula Slade, DPM;  Location: [facility];  Service: Podiatry;  Laterality: Left;  . BUNIONECTOMY Left 06/24/2017   Procedure: Annye English;  Surgeon: Trula Slade, DPM;  Location: [facility];  Service: Podiatry;  Laterality: Left;  . CESAREAN SECTION     x 2  . FOOT SURGERY    . 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   Procedure: LAPAROSCOPIC ROUX-EN-Y GASTRIC;  Surgeon: Gayland Curry, MD;  Location: [facility];  Service: General;  Laterality: N/A;  laparoscopic roux-en-y gastric bypass  . LITHOTRIPSY Left   .  TONSILLECTOMY    . TUBAL LIGATION    . UPPER GI ENDOSCOPY  08/24/2012   Procedure: UPPER GI ENDOSCOPY;  Surgeon: Gayland Curry, MD;  Location: [facility];  Service: General;;  . WISDOM TOOTH EXTRACTION      Family History  Problem Relation Age of Onset  . Hyperlipidemia Father   . Hypertension Father   . Kidney disease Father   . Colon cancer Father 60  . Hypertension Mother   . Hypertension Maternal Grandmother   . Uterine cancer Maternal Grandmother 76  . Diabetes Maternal Grandfather     Social History   Socioeconomic History  . Marital status: Married    Spouse name: Not on file  . Number of children: Not on file  . Years of education: Not on file  . Highest education level: Not on file  Occupational History  . Not on file  Social Needs  . Financial resource strain: Not on file  . Food insecurity    Worry: Not on file    Inability: Not on file  . Transportation needs    Medical: Not on file    Non-medical: Not on file  Tobacco Use  . Smoking status: Never Smoker  . Smokeless tobacco: Never Used  Substance and Sexual Activity  . Alcohol use: No    Comment: rare/socially  . Drug use: No  . Sexual activity: Yes  Birth control/protection: Other-see comments    Comment: tubal ligation  Lifestyle  . Physical activity    Days per week: Not on file    Minutes per session: Not on file  . Stress: Not on file  Relationships  . Social Musicianconnections    Talks on phone: Not on file    Gets together: Not on file    Attends religious service: Not on file    Active member of club or organization: Not on file    Attends meetings of clubs or organizations: Not on file    Relationship status: Not on file  . Intimate partner violence    Fear of current or ex partner: Not on file    Emotionally abused: Not on file    Physically abused: Not on file    Forced sexual activity: Not on file  Other Topics Concern  . Not on file  Social History Narrative   Married, lives with  spouse and dtr born 07/2009. 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     Physical Exam  Vital Signs and Nursing Notes reviewed Vitals:   11/30/18 0844  BP: 121/72  Pulse: 93  Resp: 16  Temp: 97.9 F (36.6 C)  SpO2: 100%    CONSTITUTIONAL: Well-appearing, NAD NEURO:  Alert and oriented x 3, no focal deficits EYES:  eyes equal and reactive ENT/NECK:  no LAD, no JVD CARDIO: Regular rate, well-perfused, normal S1 and S2 PULM:  CTAB no wheezing or rhonchi GI/GU:  normal bowel sounds, non-distended, non-tender MSK/SPINE:  No gross deformities, no edema SKIN: Puncture wounds to the dorsal and ventral aspect of the webbing between the thumb and pointer finger of the right hand with surrounding mild erythema to each site, moderate tenderness palpation; small abrasion to the distal left middle finger PSYCH:  Appropriate speech and behavior  Diagnostic and Interventional Summary    Labs Reviewed - No data to display  DG Hand Complete Right    (Results Pending)    Medications - No data to display   Procedures Critical Care  ED Course and Medical Decision Making  I have reviewed the triage vital signs and the nursing notes.  Pertinent labs & imaging results that were available during my care of the patient were reviewed by me and considered in my medical decision making (see below for details).  X-ray to exclude retained tooth, otherwise patient has normal vital signs, no fever, may be developing a mild cellulitis related to the cat bite, concern for inoculation with bacteria, possibly Pasteurella, will cover with Augmentin.  After the discussed management above, the patient was determined to be safe for discharge.  The patient was in agreement with this plan and all questions regarding their care were answered.  ED return precautions were discussed and the patient will return to the ED with any significant worsening of condition.  Elmer SowMichael M. Pilar PlateBero, MD [facility] [email or web address]  Final Clinical Impressions(s) / ED Diagnoses     ICD-10-CM   1. Cat bite, initial encounter  W55.01XA     ED Discharge Orders         Ordered    amoxicillin-clavulanate (AUGMENTIN) 875-125 MG tablet  Every 12 hours     11/30/18 0933             Sabas SousBero, Jarrick Fjeld M, MD 11/30/18 [id number]

## 2018-11-30 NOTE — ED Triage Notes (Signed)
Bitten/scratched by her own cat when trying to introduce the cat to a dog.  Immunizations are up to date.  Cat does not go outside.

## 2018-12-01 ENCOUNTER — Encounter: Payer: Self-pay | Admitting: Family Medicine

## 2018-12-01 ENCOUNTER — Encounter (HOSPITAL_BASED_OUTPATIENT_CLINIC_OR_DEPARTMENT_OTHER): Payer: Self-pay

## 2018-12-01 ENCOUNTER — Emergency Department (HOSPITAL_BASED_OUTPATIENT_CLINIC_OR_DEPARTMENT_OTHER)
Admission: EM | Admit: 2018-12-01 | Discharge: 2018-12-01 | Disposition: A | Discharge status: Home / Self Care | Payer: No Typology Code available for payment source | Attending: Emergency Medicine | Admitting: Emergency Medicine

## 2018-12-01 ENCOUNTER — Other Ambulatory Visit: Payer: Self-pay

## 2018-12-01 DIAGNOSIS — L03113 Cellulitis of right upper limb: Secondary | ICD-10-CM | POA: Insufficient documentation

## 2018-12-01 DIAGNOSIS — M79641 Pain in right hand: Secondary | ICD-10-CM | POA: Diagnosis present

## 2018-12-01 DIAGNOSIS — L03119 Cellulitis of unspecified part of limb: Secondary | ICD-10-CM

## 2018-12-01 DIAGNOSIS — E039 Hypothyroidism, unspecified: Secondary | ICD-10-CM | POA: Diagnosis not present

## 2018-12-01 DIAGNOSIS — Z79899 Other long term (current) drug therapy: Secondary | ICD-10-CM | POA: Insufficient documentation

## 2018-12-01 DIAGNOSIS — W5501XA Bitten by cat, initial encounter: Secondary | ICD-10-CM

## 2018-12-01 LAB — BASIC METABOLIC PANEL
Anion gap: 8 (ref 5–15)
BUN: 19 mg/dL (ref 6–20)
CO2: 24 mmol/L (ref 22–32)
Calcium: 8.9 mg/dL (ref 8.9–10.3)
Chloride: 105 mmol/L (ref 98–111)
Creatinine, Ser: 0.48 mg/dL (ref 0.44–1.00)
GFR calc Af Amer: >60 mL/min (ref >60)
GFR calc non Af Amer: >60 mL/min (ref >60)
Glucose, Bld: 107 mg/dL — ABNORMAL HIGH (ref 70–99)
Potassium: 4.1 mmol/L (ref 3.5–5.1)
Sodium: 137 mmol/L (ref 135–145)

## 2018-12-01 LAB — CBC WITH DIFFERENTIAL/PLATELET
Abs Immature Granulocytes: 0.02 10*3/uL (ref 0.00–0.07)
Basophils Absolute: 0 10*3/uL (ref 0.0–0.1)
Basophils Relative: 0 %
Eosinophils Absolute: 0 10*3/uL (ref 0.0–0.5)
Eosinophils Relative: 0 %
HCT: 37.4 % (ref 36.0–46.0)
Hemoglobin: 11.8 g/dL — ABNORMAL LOW (ref 12.0–15.0)
Immature Granulocytes: 0 %
Lymphocytes Relative: 18 %
Lymphs Abs: 1.5 10*3/uL (ref 0.7–4.0)
MCH: 26.6 pg (ref 26.0–34.0)
MCHC: 31.6 g/dL (ref 30.0–36.0)
MCV: 84.2 fL (ref 80.0–100.0)
Monocytes Absolute: 0.5 10*3/uL (ref 0.1–1.0)
Monocytes Relative: 7 %
Neutro Abs: 5.9 10*3/uL (ref 1.7–7.7)
Neutrophils Relative %: 75 %
Platelets: 334 10*3/uL (ref 150–400)
RBC: 4.44 MIL/uL (ref 3.87–5.11)
RDW: 15.2 % (ref 11.5–15.5)
WBC: 8 10*3/uL (ref 4.0–10.5)
nRBC: 0 % (ref 0.0–0.2)

## 2018-12-01 MED ORDER — HYDROCODONE-ACETAMINOPHEN 5-325 MG PO TABS
1.0000 | ORAL_TABLET | Freq: Once | ORAL | Status: AC
  Administered 2018-12-01: 1 via ORAL
  Filled 2018-12-01: qty 1

## 2018-12-01 MED ORDER — ONDANSETRON HCL 4 MG/2ML IJ SOLN
4.0000 mg | Freq: Once | INTRAMUSCULAR | Status: AC
  Administered 2018-12-01: 4 mg via INTRAVENOUS
  Filled 2018-12-01: qty 2

## 2018-12-01 MED ORDER — HYDROCODONE-ACETAMINOPHEN 5-325 MG PO TABS: 1.0000 | ORAL_TABLET | Freq: Four times a day (QID) | ORAL | 0 refills | Status: DC | PRN

## 2018-12-01 MED ORDER — SODIUM CHLORIDE 0.9 % IV SOLN
3.0000 g | Freq: Once | INTRAVENOUS | Status: AC
  Administered 2018-12-01: 3 g via INTRAVENOUS
  Filled 2018-12-01: qty 3

## 2018-12-01 MED ORDER — HYDROCODONE-ACETAMINOPHEN 5-325 MG PO TABS
1.0000 | ORAL_TABLET | Freq: Once | ORAL | Status: AC
  Administered 2018-12-01: 19:00:00 1 via ORAL
  Filled 2018-12-01: qty 1

## 2018-12-01 NOTE — ED Triage Notes (Signed)
Pt for recheck of cat bite to right hand-states area is worse and she is having low grade fever and nausea-NAD-steady gait

## 2018-12-01 NOTE — ED Notes (Signed)
Ortho tech applying splint at this time.

## 2018-12-01 NOTE — ED Provider Notes (Signed)
[place] EMERGENCY DEPARTMENT Provider Note   CSN: [id number] Arrival date & time: 12/01/18  1728     History   Chief Complaint Chief Complaint  Patient presents with  . Animal Bite    HPI [name] [name] is a 33 y.o. female.     Patient is a 33 year old female with a history of PCOS, obesity and GERD who is presenting today with worsening hand pain and swelling.  Patient was bit by a cat on Monday and was seen on Tuesday for some pain and swelling.  She was started on Augmentin which she has taken a total of 3 doses but noticed worsening swelling and pain today.  She has had general chills and malaise as well as some mild nausea today.  She did mark the area at noon and it stayed about the same but she noticed significant warmth and swelling of the hands.  Her tetanus shot is up-to-date and the cats shots and rabies are up-to-date.  The history is provided by the patient.    Past Medical History:  Diagnosis Date  . Anemia   . Anxiety   . DEPRESSION   . GERD (gastroesophageal reflux disease)   . Hypothyroidism    Hx of, normalized TSH during pregnancy 2011  . Infertility, female   . Migraines   . Morbid obesity ([place])    s/p RY 08/2012 - start weight 290 pounds  . Nephrolithiasis   . PCOS (polycystic ovarian syndrome)   . PONV (postoperative nausea and vomiting)   . Pregnancy induced hypertension     Patient Active Problem List   Diagnosis Date Noted  . Abnormal serum level of alkaline phosphatase 11/21/2018  . Pain in both lower extremities 11/21/2018  . Nocturnal leg cramps 10/26/2018  . Tachycardia 10/26/2018  . Bilateral lower extremity edema 10/26/2018  . Low serum ferritin level 10/26/2018  . Abdominal pain 08/17/2018  . Nephrolithiasis   . Bleeding diathesis ([place]) 10/13/2017  . Depressive disorder 07/09/2017  . Dysmenorrhea 07/09/2017  . Menorrhagia 07/09/2017  . Reduced libido, due to Lexapro 07/09/2017  . History of cesarean section  07/09/2017  . Binge eating disorder 04/23/2017  . Morbid obesity ([place]) 04/23/2017  . 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 Pes planus 07/16/2016  . Hallux varus, acquired, right 03/18/2016  . Iron deficiency anemia 02/22/2015  . 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 Anxiety 04/10/2010    Past Surgical History:  Procedure Laterality Date  . Barbie Banner OSTEOTOMY Left 06/24/2017   Procedure: [employer];  Surgeon: Trula Slade, DPM;  Location: [facility];  Service: Podiatry;  Laterality: Left;  . BUNIONECTOMY Left 06/24/2017   Procedure: Annye English;  Surgeon: Trula Slade, DPM;  Location: [place] [facility];  Service: Podiatry;  Laterality: Left;  . CESAREAN SECTION     x 2  . FOOT SURGERY    . 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   Procedure: LAPAROSCOPIC ROUX-EN-Y GASTRIC;  Surgeon: Gayland Curry, MD;  Location: [facility];  Service: General;  Laterality: N/A;  laparoscopic roux-en-y gastric bypass  . LITHOTRIPSY Left   . TONSILLECTOMY    . TUBAL LIGATION    . UPPER GI ENDOSCOPY  08/24/2012   Procedure: UPPER GI ENDOSCOPY;  Surgeon: Gayland Curry, MD;  Location: [facility];  Service: General;;  . WISDOM TOOTH EXTRACTION       OB History    Gravida  2   Para  2  Term  2   Preterm  0   AB  0   Living  2     SAB  0   TAB  0   Ectopic  0   Multiple  0   Live Births  2            Home Medications    Prior to Admission medications   Medication Sig Start Date End Date Taking? Authorizing Provider  acetaminophen (TYLENOL) 500 MG tablet Take 1,000 mg by mouth every 6 (six) hours as needed for moderate pain.     [provider]  amoxicillin-clavulanate (AUGMENTIN) 875-125 MG tablet Take 1 tablet by mouth every 12 (twelve) hours for 7 days. 11/30/18 12/07/18  Sabas SousBero, Michael M, MD  CALCIUM-MAGNESIUM-ZINC PO Take 3 tablets by mouth daily.    [provider]  diclofenac sodium (VOLTAREN) 1 % GEL Apply  topically to affected area qid Patient taking differently: Apply 2 g topically 4 (four) times daily as needed (pain). Apply topically to affected area qid prn for pain 12/29/17   Andrena Mewsigby, [name], DO  escitalopram (LEXAPRO) 20 MG tablet Take 1 tablet (20 mg total) by mouth daily. 04/20/18   Helane RimaWallace, Erica, DO  gabapentin (NEURONTIN) 100 MG capsule 100mg  in the morning and 200 mg at night 08/17/18   Helane RimaWallace, Erica, DO  linaclotide [facility](LINZESS) 72 MCG capsule Take 1 capsule (72 mcg total) by mouth daily. 07/06/18   Rachael FeeJacobs, Daniel P, MD  Lisdexamfetamine Dimesylate (VYVANSE) 60 MG CHEW Chew 60 mg by mouth daily. 08/17/18   Helane RimaWallace, Erica, DO  LORazepam (ATIVAN) 1 MG tablet Take 1 tablet (1 mg total) by mouth 2 (two) times daily as needed for anxiety. 11/26/18   Helane RimaWallace, Erica, DO  LORazepam (ATIVAN) 1 MG tablet Take 1 tablet (1 mg total) by mouth 2 (two) times daily as needed for anxiety. 11/26/18   Helane RimaWallace, Erica, DO  Menaquinone-7 (VITAMIN K2 PO) Take by mouth.    [provider]  metoCLOPramide (REGLAN) 10 MG tablet Take 1 tablet (10 mg total) by mouth every 6 (six) hours as needed for nausea (nausea/headache). 07/02/18   Pricilla LovelessGoldston, Scott, MD  Multiple Vitamins-Minerals (MULTIVITAMIN ADULT EXTRA C PO) Take 1 tablet by mouth daily.     [provider]  ondansetron (ZOFRAN ODT) 4 MG disintegrating tablet Take 1 tablet (4 mg total) by mouth every 8 (eight) hours as needed for nausea or vomiting. 04/19/18   Liberty HandyGibbons, Claudia J, PA-C  oxyCODONE-acetaminophen (PERCOCET/ROXICET) 5-325 MG tablet Take 1-2 tablets by mouth every 4 (four) hours as needed for severe pain. Patient not taking: Reported on 11/24/2018 11/11/18   Helane RimaWallace, Erica, DO  pantoprazole (PROTONIX) 40 MG tablet Take 1 tablet (40 mg total) by mouth daily. 07/20/18   Helane RimaWallace, Erica, DO  valACYclovir (VALTREX) 500 MG tablet 2 po in am and 2 po in pm x 1 day. 07/20/18   Helane RimaWallace, Erica, DO    Family History Family History  Problem Relation Age of  Onset  . Hyperlipidemia Father   . Hypertension Father   . Kidney disease Father   . Colon cancer Father [id number]  . Hypertension Mother   . Hypertension Maternal Grandmother   . Uterine cancer Maternal Grandmother [id number]  . Diabetes Maternal Grandfather     Social History Social History   Tobacco Use  . Smoking status: Never Smoker  . Smokeless tobacco: Never Used  Substance Use Topics  . Alcohol use: No    Comment: rare/socially  .  Drug use: No     Allergies   Nsaids   Review of Systems Review of Systems  All other systems reviewed and are negative.    Physical Exam Updated Vital Signs BP 138/83 (BP Location: Left Arm)   Pulse 96   Temp 98.6 F (37 C) (Oral)   Resp 18   LMP 11/30/2018   SpO2 100%   Physical Exam Vitals signs and nursing note reviewed.  Constitutional:      General: She is not in acute distress.    Appearance: She is well-developed.  HENT:     Head: Normocephalic and atraumatic.  Eyes:     Pupils: Pupils are equal, round, and reactive to light.  Cardiovascular:     Rate and Rhythm: Normal rate and regular rhythm.     Heart sounds: Normal heart sounds. No murmur. No friction rub.  Pulmonary:     Effort: Pulmonary effort is normal.     Breath sounds: Normal breath sounds. No wheezing or rales.  Musculoskeletal: Normal range of motion.        General: Swelling and tenderness present.       Hands:     Comments: No edema  Skin:    General: Skin is warm and dry.     Findings: No rash.  Neurological:     General: No focal deficit present.     Mental Status: She is alert and oriented to person, place, and time. Mental status is at baseline.     Cranial Nerves: No cranial nerve deficit.  Psychiatric:        Mood and Affect: Mood normal.        Behavior: Behavior normal.      ED Treatments / Results  Labs (all labs ordered are listed, but only abnormal results are displayed) Labs Reviewed  CBC WITH DIFFERENTIAL/PLATELET - Abnormal;  Notable for the following components:      Result Value   Hemoglobin 11.8 (*)    All other components within normal limits  BASIC METABOLIC PANEL - Abnormal; Notable for the following components:   Glucose, Bld 107 (*)    All other components within normal limits    EKG    Radiology Dg Hand Complete Right  Result Date: 11/30/2018 CLINICAL DATA:  Puncture wound proximal second from a CT question retained tooth EXAM: RIGHT HAND - COMPLETE 3+ VIEW COMPARISON:  12/21/2017 FINDINGS: Osseous mineralization normal. Joint spaces preserved. No acute fracture, dislocation, or bone destruction. No radiopaque foreign bodies. IMPRESSION: Normal exam. Electronically Signed   By: Ulyses SouthwardMark  Boles M.D.   On: 11/30/2018 09:52    Procedures Procedures (including critical care time)  Medications Ordered in ED Medications  ondansetron (ZOFRAN) injection 4 mg (has no administration in time range)  Ampicillin-Sulbactam (UNASYN) 3 g in sodium chloride 0.9 % 100 mL IVPB (has no administration in time range)     Initial Impression / Assessment and Plan / ED Course  I have reviewed the triage vital signs and the nursing notes.  Pertinent labs & imaging results that were available during my care of the patient were reviewed by me and considered in my medical decision making (see chart for details).        Patient presenting with signs of infected wound from a cat bite on the dorsum of the hand.  Entire hand is now swollen, erythematous and warm.  There is no drainage from the puncture wound.  Fingers are not affected and erythema does not streak up  the arm.  Patient has been on Augmentin for 36 hours with worsening of symptoms.  Blood work and a dose of IV Unasyn is given.  Will discuss with hand surgery.  7:52 PM Labs are within normal limits.  Patient had an x-ray done yesterday that was normal.  Spoke with Dr. Orlan Leavensrtman who was agreeable to the plan of patient receiving 1 dose of IV antibiotics and  continuing her oral antibiotics.  She was placed in a volar splint so she could have no motion of her hand and recommended to elevate the arm.  She will follow-up with Dr. Orlan Leavensrtman tomorrow at 1 PM.  Final Clinical Impressions(s) / ED Diagnoses   Final diagnoses:  Cat bite, initial encounter  Cellulitis of hand    ED Discharge Orders         Ordered    HYDROcodone-acetaminophen (NORCO/VICODIN) 5-325 MG tablet  Every 6 hours PRN     12/01/18 2018           Gwyneth SproutPlunkett, Ranisha Allaire, MD 12/01/18 2018

## 2018-12-02 DIAGNOSIS — M79641 Pain in right hand: Secondary | ICD-10-CM | POA: Insufficient documentation

## 2018-12-02 MED FILL — HYDROCODON-APAP 5-325: 5-325 | 5 days supply | Qty: 15 | Fill #0

## 2018-12-03 NOTE — Telephone Encounter (Signed)
Will address with husbands app

## 2018-12-07 ENCOUNTER — Telehealth: Payer: Self-pay | Admitting: Internal Medicine

## 2018-12-07 ENCOUNTER — Encounter: Payer: Self-pay | Admitting: Internal Medicine

## 2018-12-07 NOTE — Telephone Encounter (Signed)
A new hem appt has been scheduled fo rth ept to see Dr. Walden Field on 7/15 at 1pm. Letter mailed to the pt.

## 2018-12-08 ENCOUNTER — Encounter: Payer: Self-pay | Admitting: Family Medicine

## 2018-12-21 ENCOUNTER — Encounter: Payer: Self-pay | Admitting: Family Medicine

## 2018-12-27 ENCOUNTER — Telehealth: Payer: Self-pay | Admitting: Hematology

## 2018-12-27 ENCOUNTER — Other Ambulatory Visit: Payer: Self-pay | Admitting: Family Medicine

## 2018-12-27 DIAGNOSIS — F4321 Adjustment disorder with depressed mood: Secondary | ICD-10-CM

## 2018-12-27 MED ORDER — LORAZEPAM 1 MG PO TABS: 1.0000 mg | ORAL_TABLET | Freq: Two times a day (BID) | ORAL | 0 refills | Status: DC | PRN

## 2018-12-27 MED FILL — LORazepam 1 MG TABS: 1 | 30 days supply | Qty: 60 | Fill #0

## 2018-12-27 NOTE — Telephone Encounter (Signed)
Requesting refill on Lorazepam 1 mg, twice a day for anxiety. Last OV 11/11/2018, Rx 11/26/2018, # 60 , refill 0.

## 2018-12-27 NOTE — Telephone Encounter (Signed)
Pt cld to r/s her hem appt. Per pt she needed a Tues morning appt. New appt has been scheduled for the pt to see Dr. Irene Limbo on 7/28 at 10am.

## 2018-12-29 ENCOUNTER — Encounter: Payer: No Typology Code available for payment source | Admitting: Internal Medicine

## 2019-01-05 ENCOUNTER — Other Ambulatory Visit: Payer: Self-pay

## 2019-01-05 ENCOUNTER — Telehealth: Payer: Self-pay | Admitting: Gastroenterology

## 2019-01-05 ENCOUNTER — Emergency Department (HOSPITAL_COMMUNITY): Payer: No Typology Code available for payment source

## 2019-01-05 ENCOUNTER — Encounter: Payer: Self-pay | Admitting: Family Medicine

## 2019-01-05 ENCOUNTER — Emergency Department (HOSPITAL_COMMUNITY)
Admission: EM | Admit: 2019-01-05 | Discharge: 2019-01-05 | Disposition: A | Discharge status: Home / Self Care | Payer: No Typology Code available for payment source | Attending: Emergency Medicine | Admitting: Emergency Medicine

## 2019-01-05 DIAGNOSIS — R1084 Generalized abdominal pain: Secondary | ICD-10-CM | POA: Diagnosis not present

## 2019-01-05 DIAGNOSIS — R1011 Right upper quadrant pain: Secondary | ICD-10-CM

## 2019-01-05 DIAGNOSIS — Z79899 Other long term (current) drug therapy: Secondary | ICD-10-CM | POA: Insufficient documentation

## 2019-01-05 DIAGNOSIS — R112 Nausea with vomiting, unspecified: Secondary | ICD-10-CM | POA: Insufficient documentation

## 2019-01-05 DIAGNOSIS — E039 Hypothyroidism, unspecified: Secondary | ICD-10-CM | POA: Diagnosis not present

## 2019-01-05 DIAGNOSIS — R109 Unspecified abdominal pain: Secondary | ICD-10-CM | POA: Diagnosis present

## 2019-01-05 LAB — CBC WITH DIFFERENTIAL/PLATELET
Abs Immature Granulocytes: 0.03 10*3/uL (ref 0.00–0.07)
Basophils Absolute: 0 10*3/uL (ref 0.0–0.1)
Basophils Relative: 0 %
Eosinophils Absolute: 0 10*3/uL (ref 0.0–0.5)
Eosinophils Relative: 0 %
HCT: 39.9 % (ref 36.0–46.0)
Hemoglobin: 12.4 g/dL (ref 12.0–15.0)
Immature Granulocytes: 0 %
Lymphocytes Relative: 24 %
Lymphs Abs: 2.5 10*3/uL (ref 0.7–4.0)
MCH: 27 pg (ref 26.0–34.0)
MCHC: 31.1 g/dL (ref 30.0–36.0)
MCV: 86.7 fL (ref 80.0–100.0)
Monocytes Absolute: 0.6 10*3/uL (ref 0.1–1.0)
Monocytes Relative: 6 %
Neutro Abs: 7.2 10*3/uL (ref 1.7–7.7)
Neutrophils Relative %: 70 %
Platelets: 293 10*3/uL (ref 150–400)
RBC: 4.6 MIL/uL (ref 3.87–5.11)
RDW: 15.3 % (ref 11.5–15.5)
WBC: 10.4 10*3/uL (ref 4.0–10.5)
nRBC: 0 % (ref 0.0–0.2)

## 2019-01-05 LAB — URINALYSIS, ROUTINE W REFLEX MICROSCOPIC
Bilirubin Urine: NEGATIVE
Glucose, UA: NEGATIVE mg/dL
Hgb urine dipstick: NEGATIVE
Ketones, ur: NEGATIVE mg/dL
Leukocytes,Ua: NEGATIVE
Nitrite: NEGATIVE
Protein, ur: NEGATIVE mg/dL
Specific Gravity, Urine: 1.021 (ref 1.005–1.030)
pH: 6 (ref 5.0–8.0)

## 2019-01-05 LAB — COMPREHENSIVE METABOLIC PANEL
ALT: 22 U/L (ref 0–44)
AST: 22 U/L (ref 15–41)
Albumin: 4 g/dL (ref 3.5–5.0)
Alkaline Phosphatase: 147 U/L — ABNORMAL HIGH (ref 38–126)
Anion gap: 10 (ref 5–15)
BUN: 12 mg/dL (ref 6–20)
CO2: 24 mmol/L (ref 22–32)
Calcium: 8.4 mg/dL — ABNORMAL LOW (ref 8.9–10.3)
Chloride: 106 mmol/L (ref 98–111)
Creatinine, Ser: 0.58 mg/dL (ref 0.44–1.00)
GFR calc Af Amer: >60 mL/min (ref >60)
GFR calc non Af Amer: >60 mL/min (ref >60)
Glucose, Bld: 84 mg/dL (ref 70–99)
Potassium: 3.4 mmol/L — ABNORMAL LOW (ref 3.5–5.1)
Sodium: 140 mmol/L (ref 135–145)
Total Bilirubin: 0.2 mg/dL — ABNORMAL LOW (ref 0.3–1.2)
Total Protein: 6.9 g/dL (ref 6.5–8.1)

## 2019-01-05 LAB — LIPASE, BLOOD: Lipase: 34 U/L (ref 11–51)

## 2019-01-05 LAB — POC URINE PREG, ED: Preg Test, Ur: NEGATIVE

## 2019-01-05 IMAGING — CT CT ABDOMEN AND PELVIS WITH CONTRAST
2 of 4 series · 16 of 46 positions shown, 18 images · IV contrast (ISOVUE)
Comparison: CT [DATE]

CLINICAL DATA: Acute abdominal pain.

EXAM:
CT ABDOMEN AND PELVIS WITH CONTRAST
TECHNIQUE: Multidetector CT imaging of the abdomen and pelvis was performed
using the standard protocol following bolus administration of
intravenous contrast.
CONTRAST:  100mL OMNIPAQUE IOHEXOL 300 MG/ML  SOLN

[Series 2: axial st · axial · 0.93mm/px · z∈[+1002,+1422]mm · 13 of 96 slices shown, 15 images]
[im 6/96  soft-tissue]
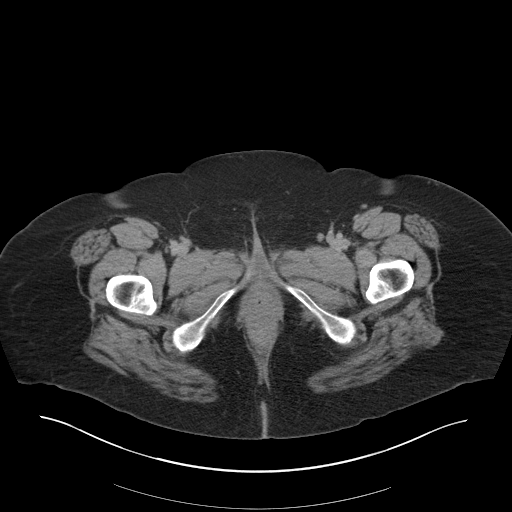
[im 6/96  bone]
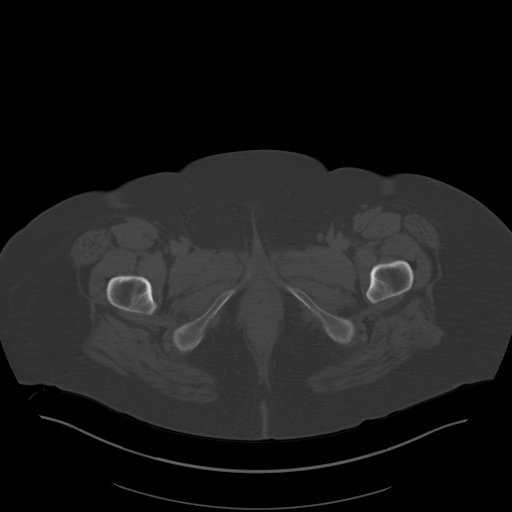
[im 11/96  soft-tissue]
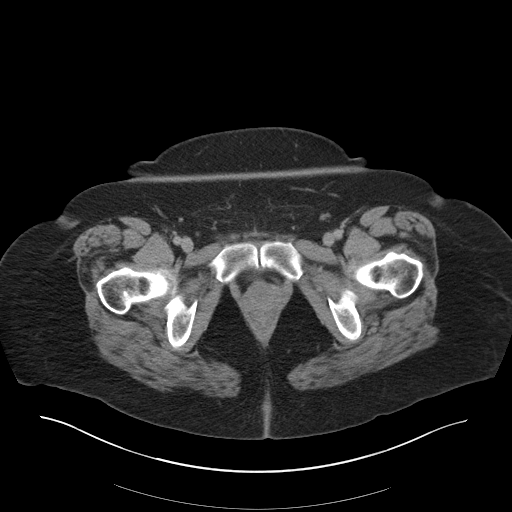
[im 22/96  soft-tissue]
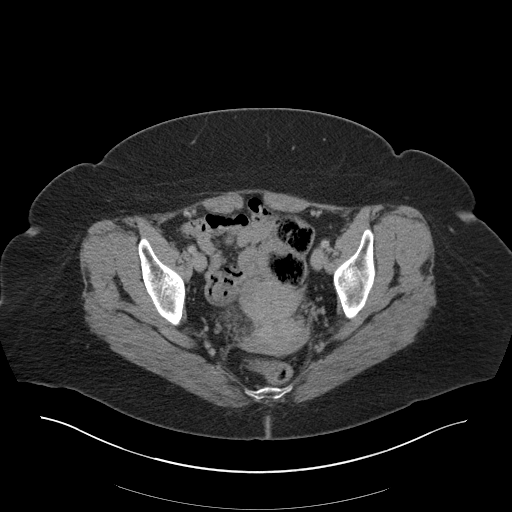
[im 27/96  soft-tissue]
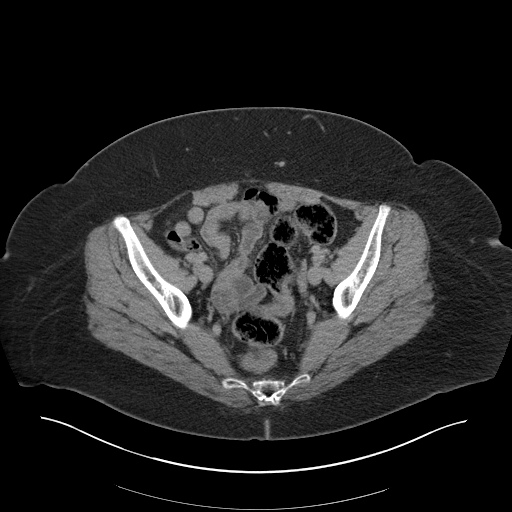
[im 32/96  soft-tissue]
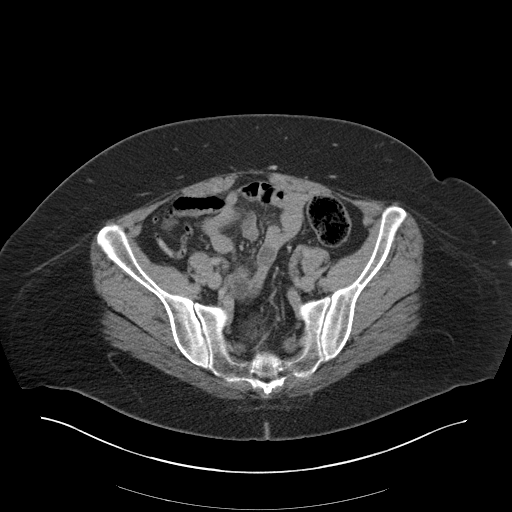
[im 43/96  soft-tissue]
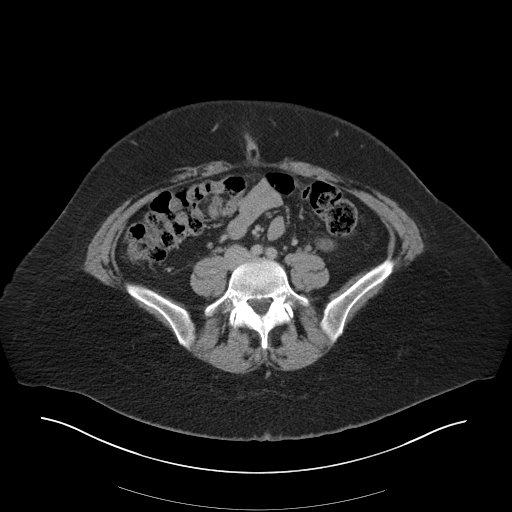
[im 48/96  soft-tissue]
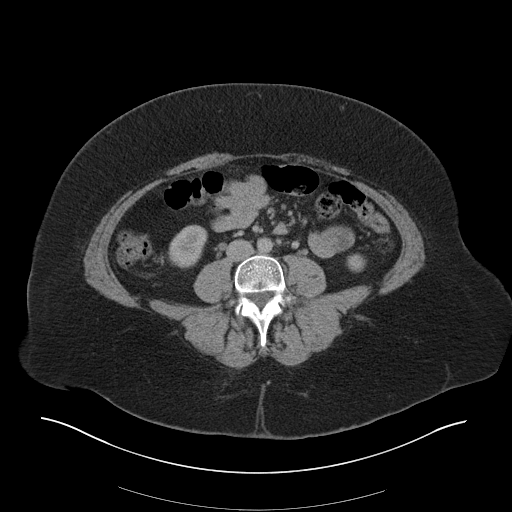
[im 53/96  soft-tissue]
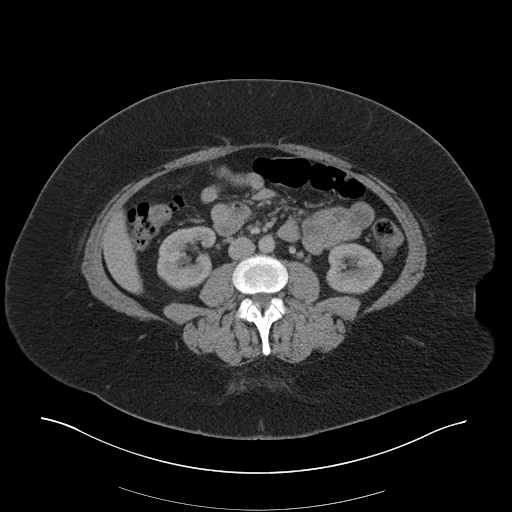
[im 64/96  soft-tissue]
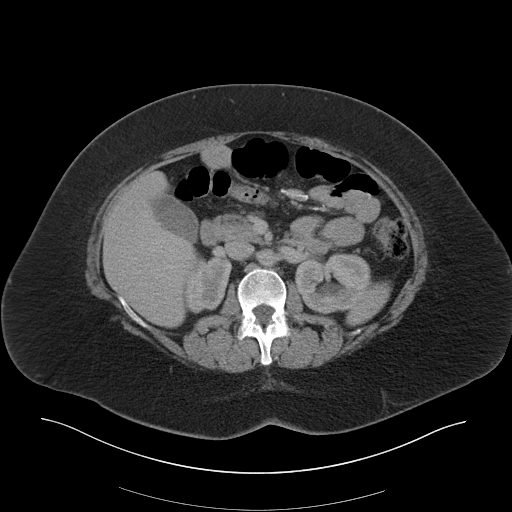
[im 64/96  bone]
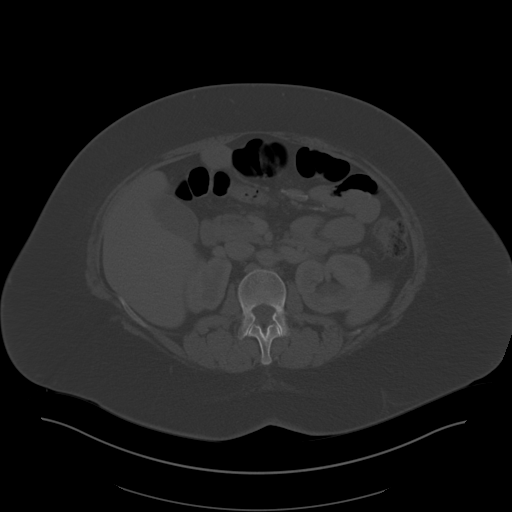
[im 69/96  soft-tissue]
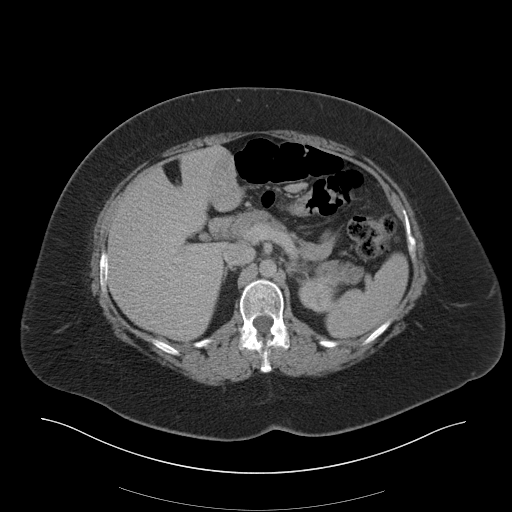
[im 74/96  soft-tissue]
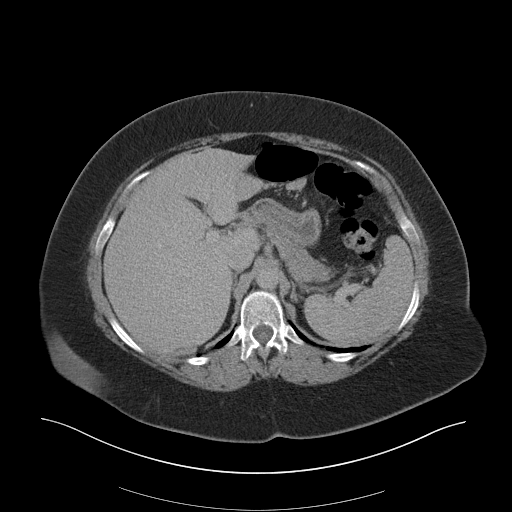
[im 85/96  soft-tissue]
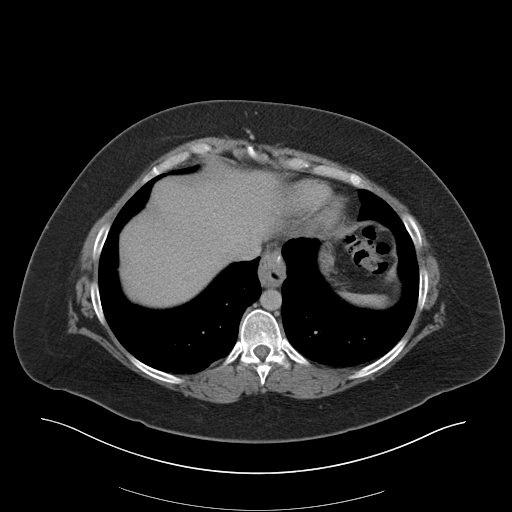
[im 90/96  soft-tissue]
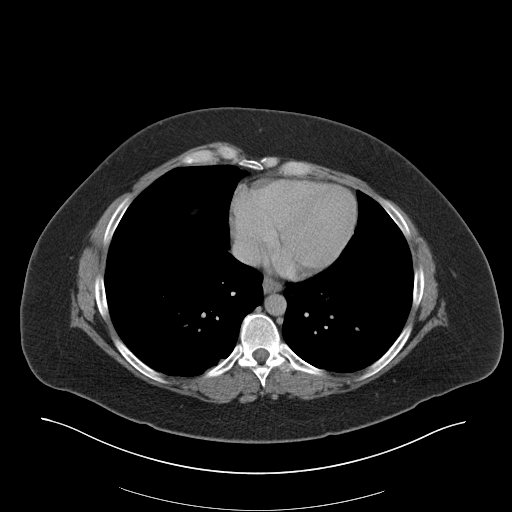

[Series 5: coronal st · coronal · 0.78mm/px · 3 of 152 slices shown]
[im 51/152  soft-tissue]
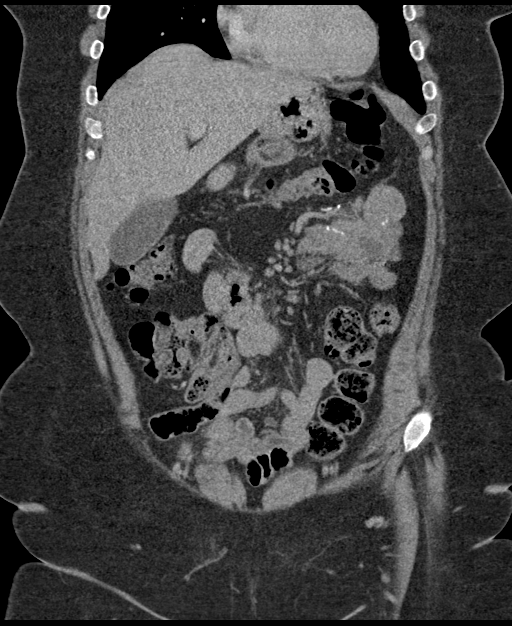
[im 68/152  soft-tissue]
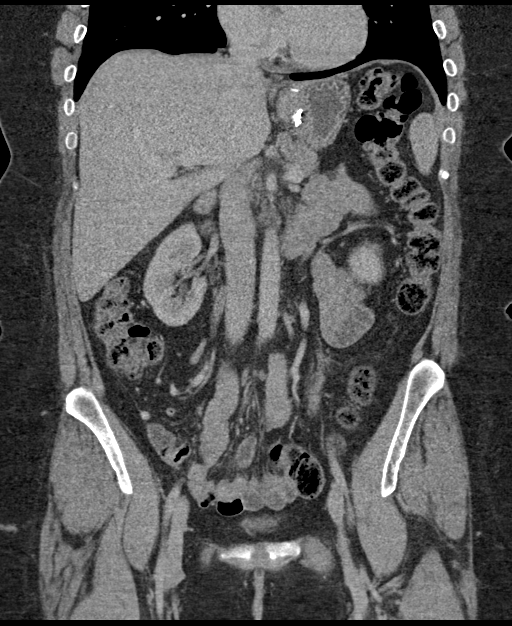
[im 84/152  soft-tissue]
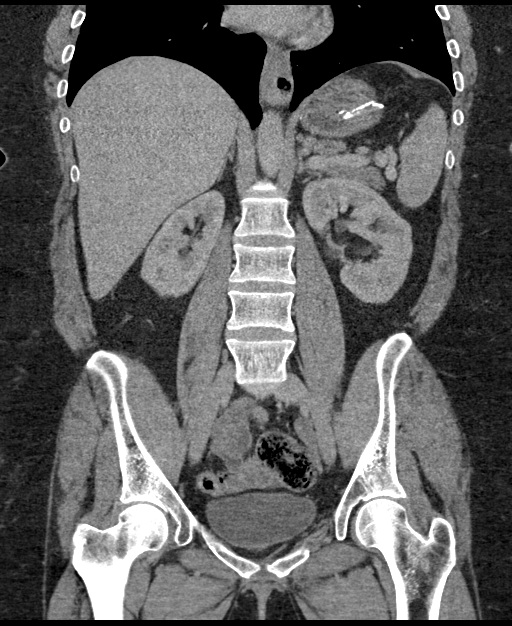

[16 of 46 positions shown; findings below may reference images not displayed]

FINDINGS: Lower chest: Mild lung base atelectasis.

Hepatobiliary: No focal liver abnormality is seen. No gallstones,
gallbladder wall thickening, or biliary dilatation.

Pancreas: No ductal dilatation or inflammation.

Spleen: Normal in size without focal abnormality.

Adrenals/Urinary Tract: Normal adrenal glands. No hydronephrosis or
perinephric edema. Extrarenal pelvis configuration of both kidneys
without hydronephrosis or ureteral dilatation. Urinary bladder is
partially distended and unremarkable.

Stomach/Bowel: Post gastric bypass with small hiatal hernia.
Excluded gastric remnant is decompressed. Roux limb is nondilated.
No evidence of obstruction or bowel inflammation. No small bowel
wall thickening. Normal appendix. Moderate stool burden in the
colon. Sigmoid colonic tortuosity. No colonic wall thickening or
inflammatory change.

Vascular/Lymphatic: No significant vascular findings are present. No
enlarged abdominal or pelvic lymph nodes.

Reproductive: Uterus and bilateral adnexa are unremarkable.

Other: No free air, free fluid, or intra-abdominal fluid collection.

Musculoskeletal: There are no acute or suspicious osseous
abnormalities.
IMPRESSION: 1. No acute abnormality or explanation for abdominal pain.
2. Post gastric bypass without complication.  Small hiatal hernia.

## 2019-01-05 IMAGING — US ULTRASOUND ABDOMEN LIMITED
1 series · 14 of 25 positions shown · non-contrast
Comparison: None.

CLINICAL DATA: Right upper quadrant pain for 1 day

EXAM:
ULTRASOUND ABDOMEN LIMITED RIGHT UPPER QUADRANT

[Series 1: ultrasound abdomen limited · 14 of 49 slices shown]
[im 1/49]
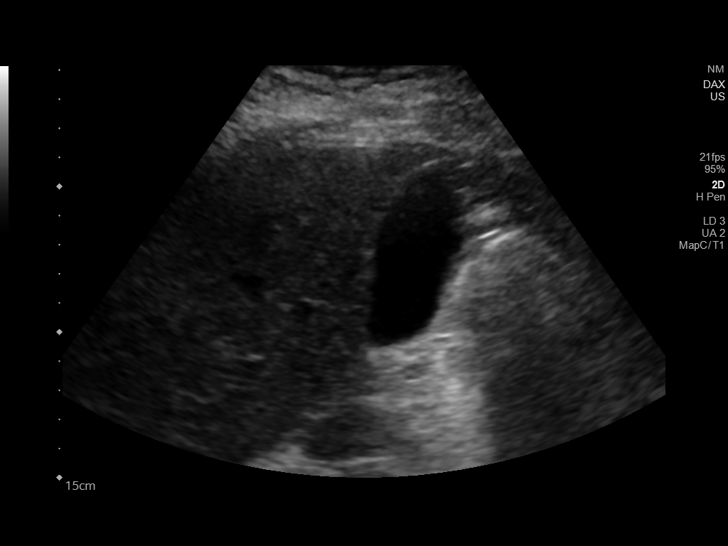
[im 5/49]
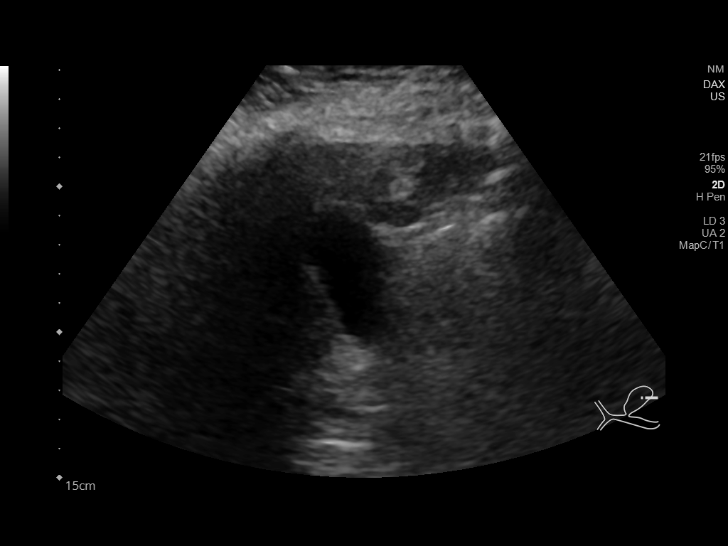
[im 9/49]
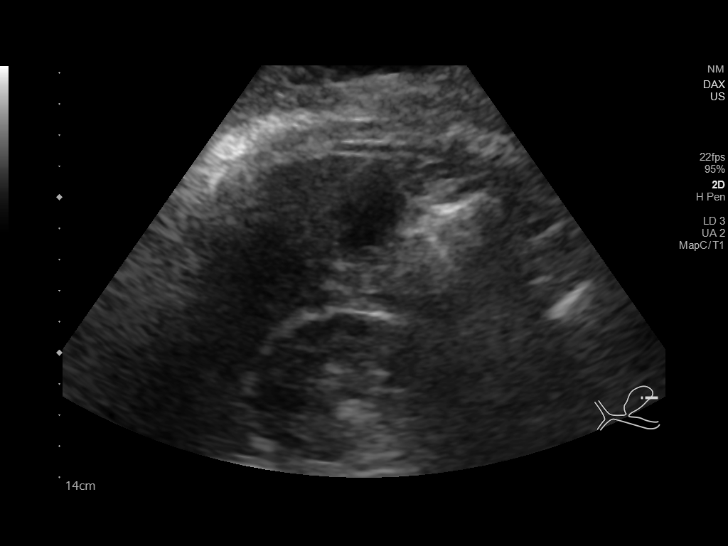
[im 13/49]
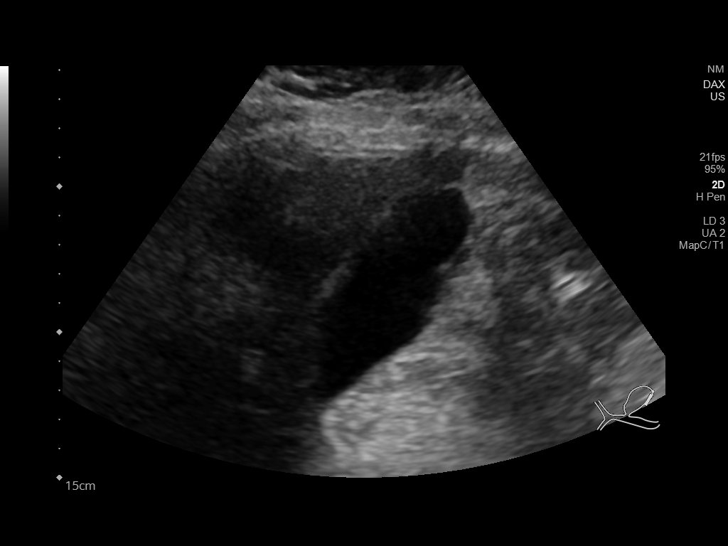
[im 17/49]
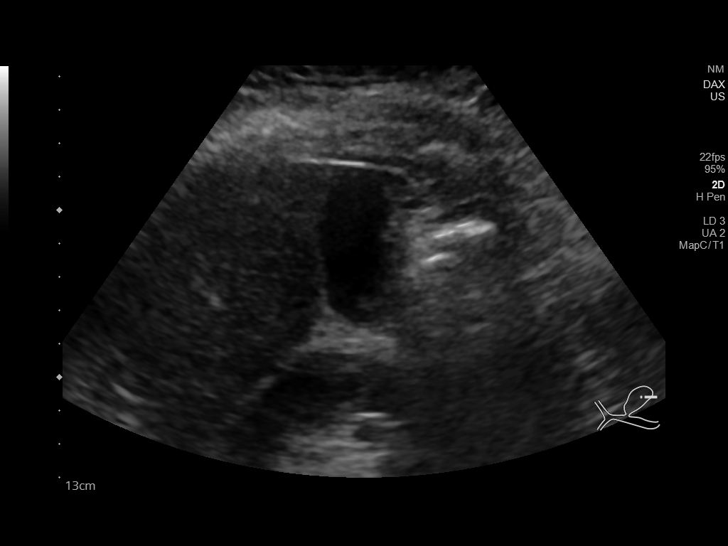
[im 19/49]
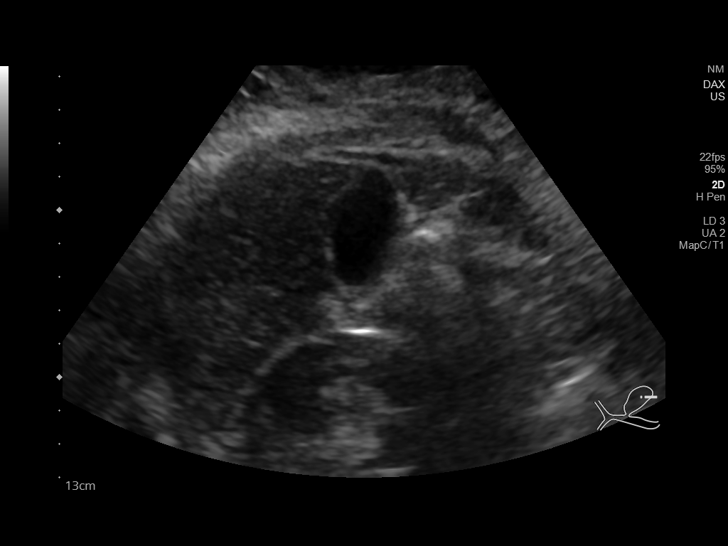
[im 23/49]
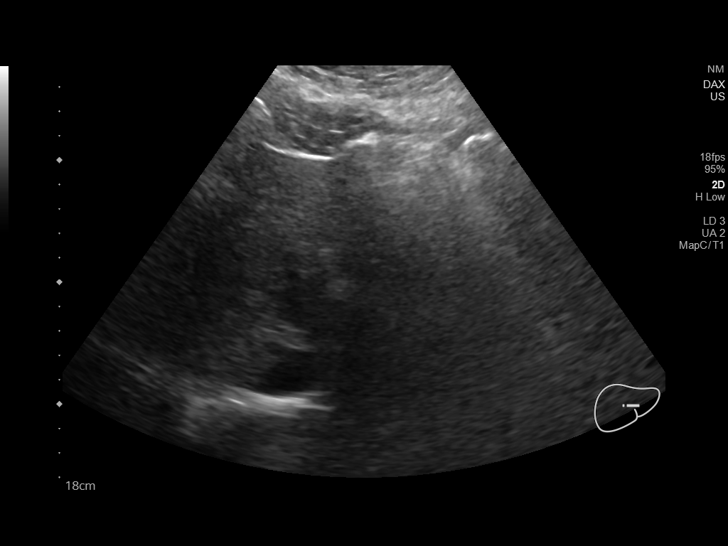
[im 27/49]
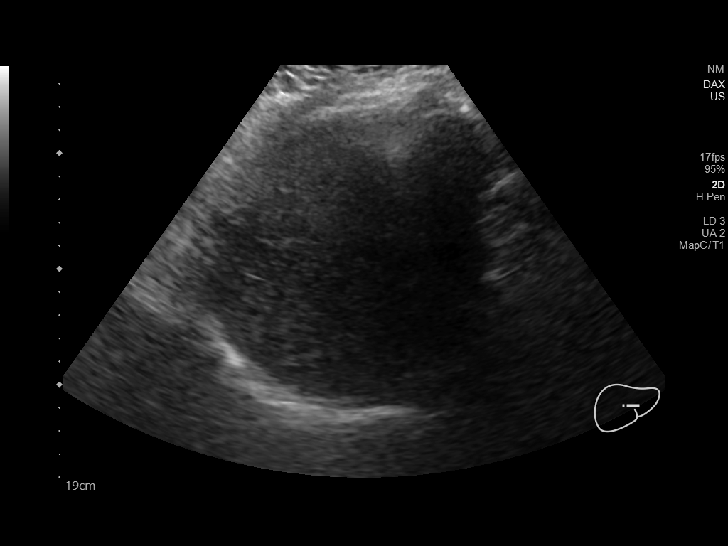
[im 31/49]
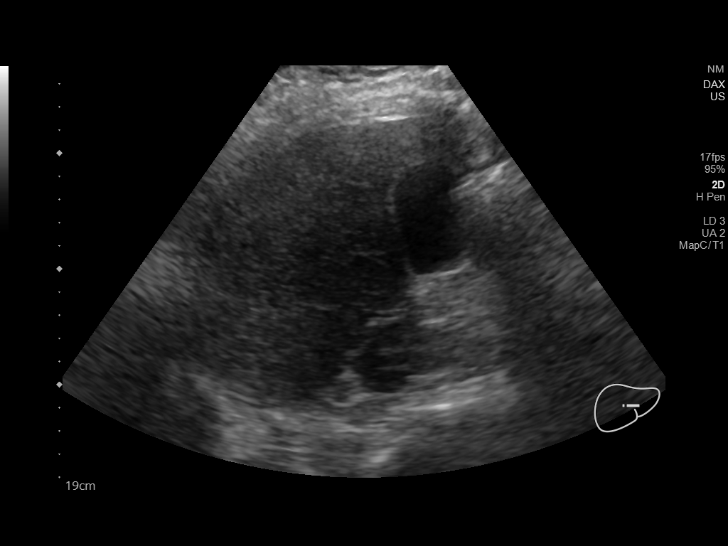
[im 33/49]
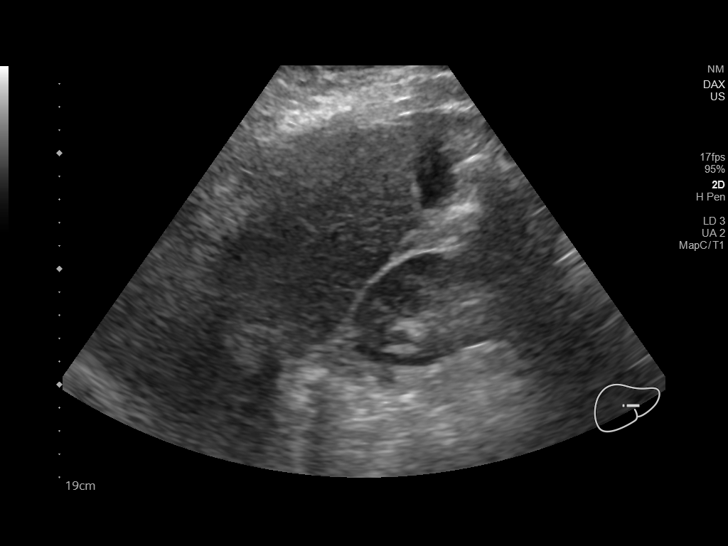
[im 37/49]
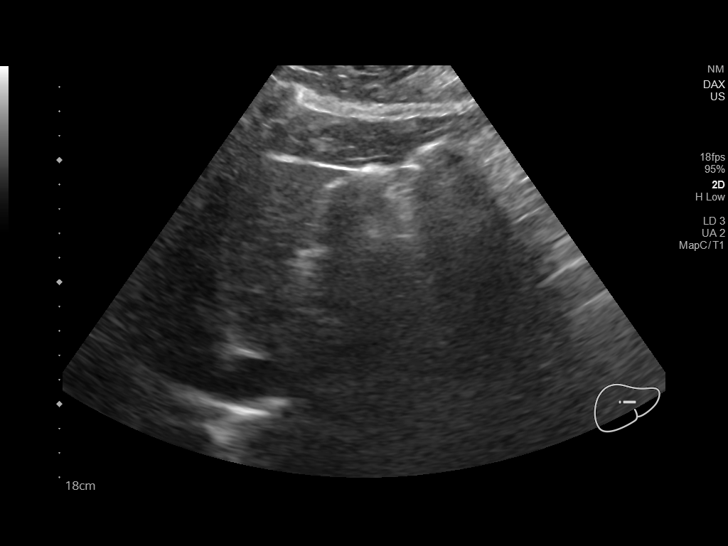
[im 41/49]
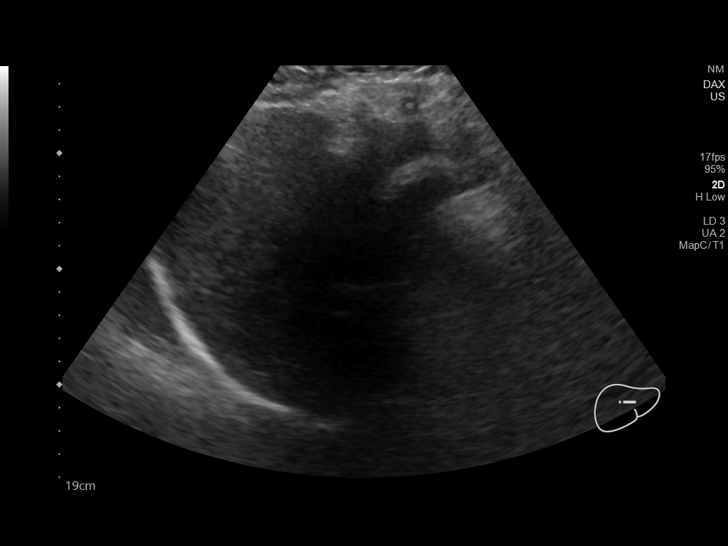
[im 45/49]
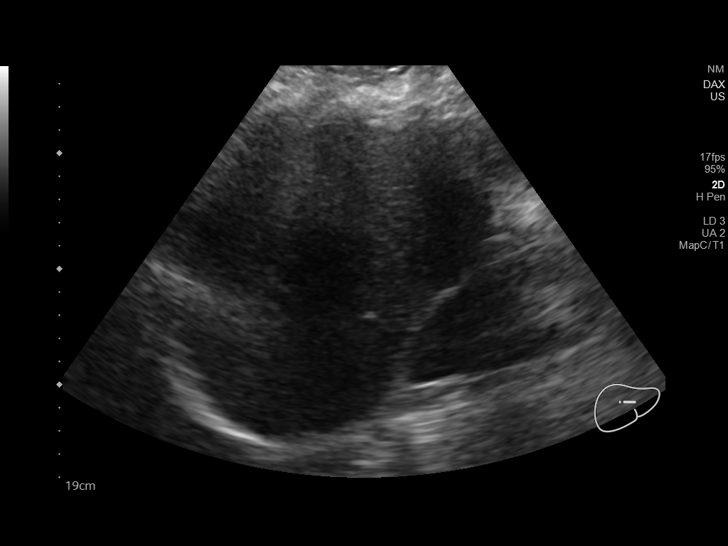
[im 49/49]
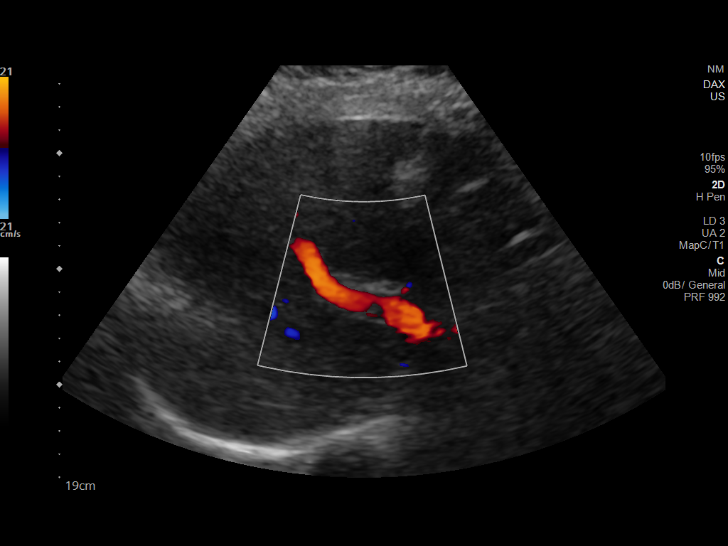

[14 of 25 positions shown; findings below may reference images not displayed]

FINDINGS: Gallbladder:

Gallbladder is well distended without evidence of gallstones. Mild
gallbladder sludge is noted. No pericholecystic fluid or wall
thickening is noted.

Common bile duct:

Diameter: 2.5 mm.

Liver:

Mildly increased echogenicity consistent with fatty infiltration. No
focal mass is noted. Portal vein is patent on color Doppler imaging
with normal direction of blood flow towards the liver.
IMPRESSION: Fatty liver.

Gallbladder sludge.

## 2019-01-05 MED ORDER — DICYCLOMINE HCL 20 MG PO TABS: 20.0000 mg | ORAL_TABLET | Freq: Two times a day (BID) | ORAL | 0 refills | Status: DC

## 2019-01-05 MED ORDER — HYDROCODONE-ACETAMINOPHEN 5-325 MG PO TABS: 1.0000 | ORAL_TABLET | Freq: Four times a day (QID) | ORAL | 0 refills | Status: DC | PRN

## 2019-01-05 MED ORDER — HYDROMORPHONE HCL 1 MG/ML IJ SOLN
1.0000 mg | Freq: Once | INTRAMUSCULAR | Status: AC
  Administered 2019-01-05: 23:00:00 1 mg via INTRAVENOUS
  Filled 2019-01-05: qty 1

## 2019-01-05 MED ORDER — MORPHINE SULFATE (PF) 4 MG/ML IV SOLN
4.0000 mg | Freq: Once | INTRAVENOUS | Status: AC
  Administered 2019-01-05: 22:00:00 4 mg via INTRAVENOUS
  Filled 2019-01-05: qty 1

## 2019-01-05 MED ORDER — IOHEXOL 300 MG/ML  SOLN
100.0000 mL | Freq: Once | INTRAMUSCULAR | Status: AC | PRN
  Administered 2019-01-05: 100 mL via INTRAVENOUS

## 2019-01-05 MED ORDER — SODIUM CHLORIDE (PF) 0.9 % IJ SOLN
INTRAMUSCULAR | Status: AC
  Filled 2019-01-05: qty 50

## 2019-01-05 MED ORDER — SODIUM CHLORIDE 0.9 % IV BOLUS
1000.0000 mL | Freq: Once | INTRAVENOUS | Status: AC
  Administered 2019-01-05: 22:00:00 1000 mL via INTRAVENOUS

## 2019-01-05 MED ORDER — ONDANSETRON 4 MG PO TBDP: 4.0000 mg | ORAL_TABLET | Freq: Three times a day (TID) | ORAL | 0 refills | Status: DC | PRN

## 2019-01-05 MED ORDER — ONDANSETRON HCL 4 MG/2ML IJ SOLN
4.0000 mg | Freq: Once | INTRAMUSCULAR | Status: AC
  Administered 2019-01-05: 4 mg via INTRAVENOUS
  Filled 2019-01-05: qty 2

## 2019-01-05 NOTE — Telephone Encounter (Signed)
The pt has abd pain and nausea with occassional vomiting.  She was seen in January and had procedure in February of this year.  She was advised to have Ct scan but decided to not have the scan at that time.   Amy Esterwood 8/11 appt for abd pain, nausea and vomiting.  I offered her an appt for 7/23 but the pt declined due to work hours.

## 2019-01-05 NOTE — ED Provider Notes (Signed)
[facility] DEPT Provider Note   CSN: [id number] Arrival date & time: 01/05/19  1702  History   Chief Complaint Chief Complaint  Patient presents with   Abdominal Pain   HPI [name] [name] is a 33 y.o. female with past medical history significant for GERD, hypothyroidism, migraines, chronic abdominal pain, anxiety, PCOS who presents for evaluation of abdominal pain.  Patient states she has had recurrent abdominal pain since November of this year.  Had ultrasound of right upper quadrant and CT scan done in January which was negative.  States pain is been intermittent since then.  She is followed by Dr. Ardis Hughs with [facility].  She called today due to her pain and was told to proceed to the emergency department for evaluation.  Patient states she was told she needed to have a CT scan.  States she has had 2 episodes of nonbilious emesis as well as persistent nausea.  Pain started to left upper quadrant and radiated into her left flank and now is across her epigastric and right upper quadrant.  She denies prior history of gallstones.  She is s/p laparoscopic Roux-en-Y.  She denies previous history of pancreatitis, NSAID use or alcohol use.  She has not taken anything for pain.  She rates her current pain a 7/10.  Last bowel movement this morning without melena or hematochezia.  Denies fever, chills, headache, chest pain, shortness of breath, dysuria, constipation, diarrhea, hematemesis.  Denies additional aggravating or alleviating factors.  History obtained from patient and past medical records.  No interpreter was used.     HPI  Past Medical History:  Diagnosis Date   Anemia    Anxiety    DEPRESSION    GERD (gastroesophageal reflux disease)    Hypothyroidism    Hx of, normalized TSH during pregnancy 2011   Infertility, female    Migraines    Morbid obesity ([place])    s/p RY 08/2012 - start weight 290 pounds   Nephrolithiasis    PCOS  (polycystic ovarian syndrome)    PONV (postoperative nausea and vomiting)    Pregnancy induced hypertension     Patient Active Problem List   Diagnosis Date Noted   Abnormal serum level of alkaline phosphatase 11/21/2018   Pain in both lower extremities 11/21/2018   Nocturnal leg cramps 10/26/2018   Tachycardia 10/26/2018   Bilateral lower extremity edema 10/26/2018   Low serum ferritin level 10/26/2018   Abdominal pain 08/17/2018   Nephrolithiasis    Bleeding diathesis ([place]) 10/13/2017   Depressive disorder 07/09/2017   Dysmenorrhea 07/09/2017   Menorrhagia 07/09/2017   Reduced libido, due to Lexapro 07/09/2017   History of cesarean section 07/09/2017   Binge eating disorder 04/23/2017   Morbid obesity ([place]) 04/23/2017   Hav (hallux abducto valgus), left 02/05/2017   Pes planus 07/16/2016   Hallux varus, acquired, right 03/18/2016   Iron deficiency anemia 02/22/2015   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   Anxiety 04/10/2010    Past Surgical History:  Procedure Laterality Date   Barbie Banner OSTEOTOMY Left 06/24/2017   Procedure: Barbie Banner OSTEOTOMY;  Surgeon: Trula Slade, DPM;  Location: [facility];  Service: Podiatry;  Laterality: Left;   BUNIONECTOMY Left 06/24/2017   Procedure: Annye English;  Surgeon: Trula Slade, DPM;  Location: [facility];  Service: Podiatry;  Laterality: Left;   CESAREAN SECTION     x 2   FOOT SURGERY     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  Procedure: LAPAROSCOPIC ROUX-EN-Y GASTRIC;  Surgeon: Gayland Curry, MD;  Location: [facility];  Service: General;  Laterality: N/A;  laparoscopic roux-en-y gastric bypass   LITHOTRIPSY Left    TONSILLECTOMY     TUBAL LIGATION     UPPER GI ENDOSCOPY  08/24/2012   Procedure: UPPER GI ENDOSCOPY;  Surgeon: Gayland Curry, MD;  Location: [facility];  Service: General;;   WISDOM TOOTH EXTRACTION       OB History    Gravida  2   Para  2    Term  2   Preterm  0   AB  0   Living  2     SAB  0   TAB  0   Ectopic  0   Multiple  0   Live Births  2            Home Medications    Prior to Admission medications   Medication Sig Start Date End Date Taking? Authorizing Provider  acetaminophen (TYLENOL) 500 MG tablet Take 1,000 mg by mouth every 6 (six) hours as needed for moderate pain.    Yes [provider]  CALCIUM-MAGNESIUM-ZINC PO Take 3 tablets by mouth daily.   Yes [provider]  Cholecalciferol (VITAMIN D3) 125 MCG (5000 UT) TABS Take 2 tablets by mouth daily.   Yes [provider]  escitalopram (LEXAPRO) 20 MG tablet Take 1 tablet (20 mg total) by mouth daily. 04/20/18  Yes Briscoe Deutscher, DO  gabapentin (NEURONTIN) 100 MG capsule 141m in the morning and 200 mg at night Patient taking differently: Take 100-200 mg by mouth 2 (two) times daily. 1089min the morning and 200 mg at night 08/17/18  Yes WaBriscoe DeutscherDO  linaclotide (LINZESS) 72 MCG capsule Take 1 capsule (72 mcg total) by mouth daily. 07/06/18  Yes JaMilus BanisterMD  Lisdexamfetamine Dimesylate (VYVANSE) 60 MG CHEW Chew 60 mg by mouth daily. 08/17/18  Yes WaBriscoe DeutscherDO  LORazepam (ATIVAN) 1 MG tablet Take 1 tablet (1 mg total) by mouth 2 (two) times daily as needed for anxiety. 12/27/18  Yes WaBriscoe DeutscherDO  Menaquinone-7 (VITAMIN K2 PO) Take by mouth.   Yes [provider]  Multiple Vitamins-Iron (ONE-TABLET-DAILY/IRON PO) Take 1 tablet by mouth daily.   Yes [provider]  Multiple Vitamins-Minerals (MULTIVITAMIN ADULT EXTRA C PO) Take 1 tablet by mouth daily.    Yes [provider]  pantoprazole (PROTONIX) 40 MG tablet Take 1 tablet (40 mg total) by mouth daily. 07/20/18  Yes WaBriscoe DeutscherDO  valACYclovir (VALTREX) 500 MG tablet 2 po in am and 2 po in pm x 1 day. Patient taking differently: Take 500 mg by mouth daily as needed (cold sores). 2 po in am and 2 po in pm x 1 day. 07/20/18   Yes WaBriscoe DeutscherDO  diclofenac sodium (VOLTAREN) 1 % GEL Apply topically to affected area qid Patient taking differently: Apply 2 g topically 4 (four) times daily as needed (pain). Apply topically to affected area qid prn for pain 12/29/17   RiGerda DissDO  dicyclomine (BENTYL) 20 MG tablet Take 1 tablet (20 mg total) by mouth 2 (two) times daily. 01/05/19   Victorhugo Preis A, PA-C  HYDROcodone-acetaminophen (NORCO/VICODIN) 5-325 MG tablet Take 1 tablet by mouth every 6 (six) hours as needed for severe pain. 01/05/19   Dalyah Pla A, PA-C  LORazepam (ATIVAN) 1 MG tablet Take 1 tablet (1 mg total) by mouth 2 (two)  times daily as needed for anxiety. Patient not taking: Reported on 01/05/2019 11/26/18   Briscoe Deutscher, DO  metoCLOPramide (REGLAN) 10 MG tablet Take 1 tablet (10 mg total) by mouth every 6 (six) hours as needed for nausea (nausea/headache). Patient not taking: Reported on 01/05/2019 07/02/18   Sherwood Gambler, MD  ondansetron (ZOFRAN ODT) 4 MG disintegrating tablet Take 1 tablet (4 mg total) by mouth every 8 (eight) hours as needed for nausea or vomiting. 01/05/19   Charlett Merkle A, PA-C  oxyCODONE-acetaminophen (PERCOCET/ROXICET) 5-325 MG tablet Take 1-2 tablets by mouth every 4 (four) hours as needed for severe pain. Patient not taking: Reported on 11/24/2018 11/11/18   Briscoe Deutscher, DO    Family History Family History  Problem Relation Age of Onset   Hyperlipidemia Father    Hypertension Father    Kidney disease Father    Colon cancer Father 62   Hypertension Mother    Hypertension Maternal Grandmother    Uterine cancer Maternal Grandmother 76   Diabetes Maternal Grandfather     Social History Social History   Tobacco Use   Smoking status: Never Smoker   Smokeless tobacco: Never Used  Substance Use Topics   Alcohol use: No    Comment: rare/socially   Drug use: No     Allergies   Nsaids   Review of Systems Review of Systems    Constitutional: Negative.   HENT: Negative.   Eyes: Negative.   Respiratory: Negative.   Cardiovascular: Negative.   Gastrointestinal: Positive for abdominal pain, nausea and vomiting. Negative for constipation, diarrhea and rectal pain.  Genitourinary: Negative.   Musculoskeletal: Negative.   Skin: Negative.   Neurological: Negative.   All other systems reviewed and are negative.    Physical Exam Updated Vital Signs BP 127/78    Pulse 64    Temp 98.5 F (36.9 C) (Oral)    Resp 18    Ht 5' 4"  (1.626 m)    Wt 116.6 kg    SpO2 100%    BMI 44.11 kg/m   Physical Exam Vitals signs and nursing note reviewed.  Constitutional:      General: She is not in acute distress.    Appearance: She is well-developed. She is not ill-appearing, toxic-appearing or diaphoretic.  HENT:     Head: Normocephalic and atraumatic.     Mouth/Throat:     Mouth: Mucous membranes are moist.     Pharynx: Oropharynx is clear.  Eyes:     Pupils: Pupils are equal, round, and reactive to light.  Neck:     Musculoskeletal: Normal range of motion.  Cardiovascular:     Rate and Rhythm: Normal rate.     Heart sounds: Normal heart sounds. No murmur. No gallop.   Pulmonary:     Effort: Pulmonary effort is normal. No respiratory distress.     Breath sounds: Normal breath sounds. No stridor. No rhonchi.     Comments: Speaks in full sentences without difficulty.  No accessory muscle usage. Abdominal:     General: There is no distension.     Comments: Abdomen with tenderness to right upper quadrant, epigastric and left upper quadrant.  Mild tenderness to left flank.  Negative CVA tap bilaterally.  No pelvic pain.  Old laparoscopic scarring without evidence of infection.  She has a negative Murphy sign.  She has no rebound or guarding.  No evidence of abdominal wall herniation.  Musculoskeletal: Normal range of motion.     Comments: Moves all 4  extremities without difficulty.  Skin:    General: Skin is warm and dry.      Comments: Brisk capillary refill.  No rashes or lesions.  Neurological:     Mental Status: She is alert.    ED Treatments / Results  Labs (all labs ordered are listed, but only abnormal results are displayed) Labs Reviewed  COMPREHENSIVE METABOLIC PANEL - Abnormal; Notable for the following components:      Result Value   Potassium 3.4 (*)    Calcium 8.4 (*)    Alkaline Phosphatase 147 (*)    Total Bilirubin 0.2 (*)    All other components within normal limits  URINALYSIS, ROUTINE W REFLEX MICROSCOPIC  CBC WITH DIFFERENTIAL/PLATELET  LIPASE, BLOOD  POC URINE PREG, ED    EKG None  Radiology Ct Abdomen Pelvis W Contrast  Result Date: 01/05/2019 CLINICAL DATA:  Acute abdominal pain. EXAM: CT ABDOMEN AND PELVIS WITH CONTRAST TECHNIQUE: Multidetector CT imaging of the abdomen and pelvis was performed using the standard protocol following bolus administration of intravenous contrast. CONTRAST:  191m OMNIPAQUE IOHEXOL 300 MG/ML  SOLN COMPARISON:  CT 04/29/2018 FINDINGS: Lower chest: Mild lung base atelectasis. Hepatobiliary: No focal liver abnormality is seen. No gallstones, gallbladder wall thickening, or biliary dilatation. Pancreas: No ductal dilatation or inflammation. Spleen: Normal in size without focal abnormality. Adrenals/Urinary Tract: Normal adrenal glands. No hydronephrosis or perinephric edema. Extrarenal pelvis configuration of both kidneys without hydronephrosis or ureteral dilatation. Urinary bladder is partially distended and unremarkable. Stomach/Bowel: Post gastric bypass with small hiatal hernia. Excluded gastric remnant is decompressed. Roux limb is nondilated. No evidence of obstruction or bowel inflammation. No small bowel wall thickening. Normal appendix. Moderate stool burden in the colon. Sigmoid colonic tortuosity. No colonic wall thickening or inflammatory change. Vascular/Lymphatic: No significant vascular findings are present. No enlarged abdominal or pelvic  lymph nodes. Reproductive: Uterus and bilateral adnexa are unremarkable. Other: No free air, free fluid, or intra-abdominal fluid collection. Musculoskeletal: There are no acute or suspicious osseous abnormalities. IMPRESSION: 1. No acute abnormality or explanation for abdominal pain. 2. 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  Small hiatal hernia. Electronically Signed   By: MKeith RakeM.D.   On: 01/05/2019 22:49   UK[place]Abdomen Limited Ruq  Result Date: 01/05/2019 CLINICAL DATA:  Right upper quadrant pain for 1 day EXAM: ULTRASOUND ABDOMEN LIMITED RIGHT UPPER QUADRANT COMPARISON:  None. FINDINGS: Gallbladder: Gallbladder is well distended without evidence of gallstones. Mild gallbladder sludge is noted. No pericholecystic fluid or wall thickening is noted. Common bile duct: Diameter: 2.5 mm. Liver: Mildly increased echogenicity consistent with fatty infiltration. No focal mass is noted. Portal vein is patent on color Doppler imaging with normal direction of blood flow towards the liver. IMPRESSION: Fatty liver. Gallbladder sludge. Electronically Signed   By: MInez CatalinaM.D.   On: 01/05/2019 23:11    Procedures Procedures (including critical care time)  Medications Ordered in ED Medications  sodium chloride (PF) 0.9 % injection (has no administration in time range)  sodium chloride 0.9 % bolus 1,000 mL (1,000 mLs Intravenous New Bag/Given 01/05/19 2134)  ondansetron (ZOFRAN) injection 4 mg (4 mg Intravenous Given 01/05/19 2134)  morphine 4 MG/ML injection 4 mg (4 mg Intravenous Given 01/05/19 2134)  iohexol (OMNIPAQUE) 300 MG/ML solution 100 mL (100 mLs Intravenous Contrast Given 01/05/19 2219)  HYDROmorphone (DILAUDID) injection 1 mg (1 mg Intravenous Given 01/05/19 2257)   Initial Impression / Assessment and Plan / ED Course  I have reviewed the triage vital  signs and the nursing notes.  Pertinent labs & imaging results that were available during my care of the patient were reviewed by me  and considered in my medical decision making (see chart for details).  33 year old female appears otherwise well presents for evaluation of abdominal pain.  She is afebrile, nonseptic, non-ill-appearing.  Patient with chronic abdominal pain which began in November of this year.  Had CT scan and ultrasound of her right upper quadrant in January which was negative for acute pathology.  Patient developed sudden onset left upper quadrant abdominal pain which also radiated into her left flank and is now progressed to her epigastric and right upper quadrant.  Has had 2 episodes of nonbilious emesis.  Last bowel movement this morning.  She has diffuse generalized tenderness to her abdomen without rebound or guarding.  Heart and lungs clear.  No melena or hematochezia.  No evidence of obvious abdominal wall herniations.  Will obtain labs, CT imaging and reevaluate.  She has negative Murphy sign, I have low suspicion for acute cholecystitis. No overlying skin changes.  Labs and Imaging personally reviewed CBC without leukocytosis Metabolic panel mild hypokalemia at 3.4, elevated alk phos at 147 however improved from patient's baseline. Pregnancy negative Lipase 34 Urinalysis negative for infection Ultrasound right upper quadrant without evidence of CBD dilation, gallstones.  She does have some sludge in her gallbladder which was seen on previous US  CT negative for acute AP pathology  On reevaluation pain controlled in ED.  She is tolerating p.o. intake without difficulty. Patient is nontoxic, nonseptic appearing, in no apparent distress.  Patient's pain and other symptoms adequately managed in emergency department.  Fluid bolus given.  Labs, imaging and vitals reviewed.  Patient does not meet the SIRS or Sepsis criteria.  On repeat exam patient does not have a surgical abdomin and there are no peritoneal signs.  No indication of appendicitis, bowel obstruction, bowel perforation, cholecystitis, diverticulitis,  PID or ectopic pregnancy.  Discussed with patient follow-up with GI for her chronic abdominal pain.  May possibly need HIDA scan outpatient for gallbladder sludge.  Will DC home with Zofran, Bentyl.  The patient has been appropriately medically screened and/or stabilized in the ED. I have low suspicion for any other emergent medical condition which would require further screening, evaluation or treatment in the ED or require inpatient management.  Patient is hemodynamically stable and in no acute distress.  Patient able to ambulate in department prior to ED.  Evaluation does not show acute pathology that would require ongoing or additional emergent interventions while in the emergency department or further inpatient treatment.  I have discussed the diagnosis with the patient and answered all questions.  Pain is been managed while in the emergency department and patient has no further complaints prior to discharge.  Patient is comfortable with plan discussed in room and is stable for discharge at this time.  I have discussed strict return precautions for returning to the emergency department.  Patient was encouraged to follow-up with PCP/specialist refer to at discharge.     Final Clinical Impressions(s) / ED Diagnoses   Final diagnoses:  RUQ pain  Generalized abdominal pain  Non-intractable vomiting with nausea, unspecified vomiting type    ED Discharge Orders         Ordered    ondansetron (ZOFRAN ODT) 4 MG disintegrating tablet  Every 8 hours PRN     01/05/19 2339    HYDROcodone-acetaminophen (NORCO/VICODIN) 5-325 MG tablet  Every 6 hours PRN  01/05/19 2339    dicyclomine (BENTYL) 20 MG tablet  2 times daily     01/05/19 2339           Torres Hardenbrook A, PA-C 01/05/19 2351    Sherwood Gambler, MD 01/06/19 1226

## 2019-01-05 NOTE — ED Triage Notes (Signed)
Pt reports abdominal pain that radiates to the right flnak. Pt reports she called her GI MD and he sent her here. Pt is alert and oriented.

## 2019-01-05 NOTE — ED Notes (Signed)
Urine culture sent to the lab. 

## 2019-01-05 NOTE — ED Notes (Signed)
US at Bedside

## 2019-01-05 NOTE — Discharge Instructions (Signed)
Follow up outpatient with GI.  Take medicines as prescribed.  Do not drive or operate heavy machinery while taking this medicine.  Return to the ED with any new or worsening symptoms

## 2019-01-05 NOTE — Telephone Encounter (Signed)
Pt reported that she is experiencing N/V and abd p.  Please advise.

## 2019-01-05 NOTE — ED Notes (Signed)
Pt transported to CT ?

## 2019-01-06 MED FILL — HYDROCODON-APAP 5-325: 5-325 | 2 days supply | Qty: 6 | Fill #0

## 2019-01-06 MED FILL — DICYCLOMINE 20 MG TABLET: 20 | 10 days supply | Qty: 20 | Fill #0

## 2019-01-06 MED FILL — ONDANSETRON ODT 4 MG TABLET: 4 | 6 days supply | Qty: 20 | Fill #0

## 2019-01-10 ENCOUNTER — Other Ambulatory Visit: Payer: Self-pay

## 2019-01-10 ENCOUNTER — Encounter (HOSPITAL_COMMUNITY): Payer: Self-pay

## 2019-01-10 ENCOUNTER — Emergency Department (HOSPITAL_COMMUNITY)
Admission: EM | Admit: 2019-01-10 | Discharge: 2019-01-10 | Disposition: A | Discharge status: Home / Self Care | Payer: No Typology Code available for payment source | Attending: Emergency Medicine | Admitting: Emergency Medicine

## 2019-01-10 ENCOUNTER — Ambulatory Visit: Payer: Self-pay | Admitting: *Deleted

## 2019-01-10 DIAGNOSIS — R1010 Upper abdominal pain, unspecified: Secondary | ICD-10-CM | POA: Diagnosis present

## 2019-01-10 DIAGNOSIS — Z79899 Other long term (current) drug therapy: Secondary | ICD-10-CM | POA: Diagnosis not present

## 2019-01-10 DIAGNOSIS — E039 Hypothyroidism, unspecified: Secondary | ICD-10-CM | POA: Insufficient documentation

## 2019-01-10 DIAGNOSIS — R112 Nausea with vomiting, unspecified: Secondary | ICD-10-CM | POA: Insufficient documentation

## 2019-01-10 DIAGNOSIS — R1013 Epigastric pain: Secondary | ICD-10-CM | POA: Diagnosis not present

## 2019-01-10 LAB — COMPREHENSIVE METABOLIC PANEL
ALT: 22 U/L (ref 0–44)
AST: 19 U/L (ref 15–41)
Albumin: 4.1 g/dL (ref 3.5–5.0)
Alkaline Phosphatase: 162 U/L — ABNORMAL HIGH (ref 38–126)
Anion gap: 9 (ref 5–15)
BUN: 10 mg/dL (ref 6–20)
CO2: 27 mmol/L (ref 22–32)
Calcium: 8.9 mg/dL (ref 8.9–10.3)
Chloride: 104 mmol/L (ref 98–111)
Creatinine, Ser: 0.55 mg/dL (ref 0.44–1.00)
GFR calc Af Amer: >60 mL/min (ref >60)
GFR calc non Af Amer: >60 mL/min (ref >60)
Glucose, Bld: 92 mg/dL (ref 70–99)
Potassium: 3.5 mmol/L (ref 3.5–5.1)
Sodium: 140 mmol/L (ref 135–145)
Total Bilirubin: 0.2 mg/dL — ABNORMAL LOW (ref 0.3–1.2)
Total Protein: 7.3 g/dL (ref 6.5–8.1)

## 2019-01-10 LAB — URINALYSIS, ROUTINE W REFLEX MICROSCOPIC
Bacteria, UA: NONE SEEN
Bilirubin Urine: NEGATIVE
Glucose, UA: NEGATIVE mg/dL
Hgb urine dipstick: NEGATIVE
Ketones, ur: NEGATIVE mg/dL
Nitrite: NEGATIVE
Protein, ur: NEGATIVE mg/dL
Specific Gravity, Urine: 1.011 (ref 1.005–1.030)
pH: 6 (ref 5.0–8.0)

## 2019-01-10 LAB — CBC
HCT: 39.9 % (ref 36.0–46.0)
Hemoglobin: 12.6 g/dL (ref 12.0–15.0)
MCH: 26.9 pg (ref 26.0–34.0)
MCHC: 31.6 g/dL (ref 30.0–36.0)
MCV: 85.3 fL (ref 80.0–100.0)
Platelets: 309 10*3/uL (ref 150–400)
RBC: 4.68 MIL/uL (ref 3.87–5.11)
RDW: 15 % (ref 11.5–15.5)
WBC: 7.3 10*3/uL (ref 4.0–10.5)
nRBC: 0 % (ref 0.0–0.2)

## 2019-01-10 LAB — I-STAT BETA HCG BLOOD, ED (MC, WL, AP ONLY): I-stat hCG, quantitative: <5 m[IU]/mL (ref <5)

## 2019-01-10 LAB — LIPASE, BLOOD: Lipase: 26 U/L (ref 11–51)

## 2019-01-10 MED ORDER — PROMETHAZINE HCL 25 MG PO TABS: 25.0000 mg | ORAL_TABLET | Freq: Four times a day (QID) | ORAL | 0 refills | Status: DC | PRN

## 2019-01-10 MED ORDER — SODIUM CHLORIDE 0.9% FLUSH: 3.0000 mL | Freq: Once | INTRAVENOUS | Status: DC

## 2019-01-10 MED ORDER — ONDANSETRON 4 MG PO TBDP
4.0000 mg | ORAL_TABLET | Freq: Once | ORAL | Status: AC | PRN
  Administered 2019-01-10: 16:00:00 4 mg via ORAL
  Filled 2019-01-10: qty 1

## 2019-01-10 MED ORDER — PROMETHAZINE HCL 25 MG/ML IJ SOLN
25.0000 mg | Freq: Once | INTRAMUSCULAR | Status: AC
  Administered 2019-01-10: 25 mg via INTRAMUSCULAR
  Filled 2019-01-10: qty 1

## 2019-01-10 NOTE — ED Triage Notes (Signed)
Patient c/o RUQ pain and vomiting that radiates into the mid back x 1 week. Patient was seen last week for the same. Patient states she was referred to a GI physician,but unable to see until 01/25/19. Patient states she continues to have clay-colored stools.

## 2019-01-10 NOTE — Telephone Encounter (Signed)
Sounds like gallbladder. She is in the ED now.

## 2019-01-10 NOTE — Telephone Encounter (Signed)
See note, please advise. 

## 2019-01-10 NOTE — Telephone Encounter (Signed)
Please advise 

## 2019-01-10 NOTE — Telephone Encounter (Signed)
Pt calling with the same pain the shel had experienced last week and had gone to the ED to be assessed.  She had been fine over the weekend but started again this morning around 11 am. She is having nausea and vomiting. She has had clay colored stools and been about 3 times.  She has also had vomiting. Just liquids, has not been able to eat. She stated the pain is sharp pain and located on the right side under her breast.  She has taken Bentyl that she had gotten a prescription for and also Zofran. She has not taken the Zofran. Pain # 7 . She will not be able to see a GI provider until August 11 th.  Attempted to notify [facility] at [facility] for recommendation. Advised pt to go back to the ED to get her pain under control. She is at work and will be there until 4:30 Routing to the office for review.  Reason for Disposition . [1] MILD-MODERATE pain AND [2] constant AND [3] present > 2 hours  Answer Assessment - Initial Assessment Questions 1. LOCATION: "Where does it hurt?"      abd on the right side near her breast bone 2. RADIATION: "Does the pain shoot anywhere else?" (e.g., chest, back)     Up to under the right breast bone 3. ONSET: "When did the pain begin?" (e.g., minutes, hours or days ago)      11 am this morning 4. SUDDEN: "Gradual or sudden onset?"     sudden 5. PATTERN "Does the pain come and go, or is it constant?"    - If constant: "Is it getting better, staying the same, or worsening?"      (Note: Constant means the pain never goes away completely; most serious pain is constant and it progresses)     - If intermittent: "How long does it last?" "Do you have pain now?"     (Note: Intermittent means the pain goes away completely between bouts)     constant 6. SEVERITY: "How bad is the pain?"  (e.g., Scale 1-10; mild, moderate, or severe)   - MILD (1-3): doesn't interfere with normal activities, abdomen soft and not tender to touch    - MODERATE (4-7): interferes with normal  activities or awakens from sleep, tender to touch    - SEVERE (8-10): excruciating pain, doubled over, unable to do any normal activities      Pain #7 7. RECURRENT SYMPTOM: "Have you ever had this type of abdominal pain before?" If so, ask: "When was the last time?" and "What happened that time?"      Yes last week and went to the ED 8. CAUSE: "What do you think is causing the abdominal pain?"     Not sure 9. RELIEVING/AGGRAVATING FACTORS: "What makes it better or worse?" (e.g., movement, antacids, bowel movement)     nothing 10. OTHER SYMPTOMS: "Has there been any vomiting, diarrhea, constipation, or urine problems?"       vomiting 11. PREGNANCY: "Is there any chance you are pregnant?" "When was your last menstrual period?"       Not pregnant. Last period was last weed. Has had a tubal ligation  Protocols used: ABDOMINAL PAIN - [facility]

## 2019-01-10 NOTE — ED Provider Notes (Signed)
[facility]-EMERGENCY DEPT Provider Note   CSN: [id number] Arrival date & time: 01/10/19  1505    History   Chief Complaint Chief Complaint  Patient presents with  . Abdominal Pain  . Emesis    HPI [name] [name] is a 33 y.o. female.     HPI Presents with upper abdominal pain.  Has had over the last 8 to 9 months.  Has been seen by gastroenterology and had endoscopy without a clear cause.  Seen in the ER 5 days ago and had CT scan and ultrasound done.  Ultrasound showed biliary sludge otherwise not a clear cause.  Pain comes and goes and lasts a few days.  Had Bentyl and Zofran at home.  Also had Ativan.  States continued pain.  Has an appointment to see Dr. Christella HartiganJacobs but that is not until 11 August.  No fevers.  No diarrhea.  Has had nausea and vomiting.  Does not use marijuana.  Previous Roux-en-Y gastric bypass.  Has not noted food as a cause of the pain. Past Medical History:  Diagnosis Date  . Anemia   . Anxiety   . DEPRESSION   . GERD (gastroesophageal reflux disease)   . Hypothyroidism    Hx of, normalized TSH during pregnancy 2011  . Infertility, female   . Migraines   . Morbid obesity (HCC)    s/p RY 08/2012 - start weight 290 pounds  . Nephrolithiasis   . PCOS (polycystic ovarian syndrome)   . PONV (postoperative nausea and vomiting)   . Pregnancy induced hypertension     Patient Active Problem List   Diagnosis Date Noted  . Abnormal serum level of alkaline phosphatase 11/21/2018  . Pain in both lower extremities 11/21/2018  . Nocturnal leg cramps 10/26/2018  . Tachycardia 10/26/2018  . Bilateral lower extremity edema 10/26/2018  . Low serum ferritin level 10/26/2018  . Abdominal pain 08/17/2018  . Nephrolithiasis   . Bleeding diathesis (HCC) 10/13/2017  . Depressive disorder 07/09/2017  . Dysmenorrhea 07/09/2017  . Menorrhagia 07/09/2017  . Reduced libido, due to Lexapro 07/09/2017  . History of cesarean section 07/09/2017  .  Binge eating disorder 04/23/2017  . Morbid obesity (HCC) 04/23/2017  . 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 Pes planus 07/16/2016  . Hallux varus, acquired, right 03/18/2016  . Iron deficiency anemia 02/22/2015  . 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 Anxiety 04/10/2010    Past Surgical History:  Procedure Laterality Date  . Quintella ReichertIKEN OSTEOTOMY Left 06/24/2017   Procedure: Ralene BatheAIKEN OSTEOTOMY;  Surgeon: Vivi BarrackWagoner, Matthew R, DPM;  Location: [facility];  Service: Podiatry;  Laterality: Left;  . BUNIONECTOMY Left 06/24/2017   Procedure: Anthoney HaradaALSTON BUNIONECTOMY;  Surgeon: Vivi BarrackWagoner, Matthew R, DPM;  Location: [facility];  Service: Podiatry;  Laterality: Left;  . CESAREAN SECTION     x 2  . FOOT SURGERY    . 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   Procedure: LAPAROSCOPIC ROUX-EN-Y GASTRIC;  Surgeon: Atilano InaEric M Wilson, MD;  Location: [facility];  Service: General;  Laterality: N/A;  laparoscopic roux-en-y gastric bypass  . LITHOTRIPSY Left   . TONSILLECTOMY    . TUBAL LIGATION    . UPPER GI ENDOSCOPY  08/24/2012   Procedure: UPPER GI ENDOSCOPY;  Surgeon: Atilano InaEric M Wilson, MD;  Location: [facility];  Service: General;;  . WISDOM TOOTH EXTRACTION       OB History    Gravida  2   Para  2  Term  2   Preterm  0   AB  0   Living  2     SAB  0   TAB  0   Ectopic  0   Multiple  0   Live Births  2            Home Medications    Prior to Admission medications   Medication Sig Start Date End Date Taking? Authorizing Provider  acetaminophen (TYLENOL) 500 MG tablet Take 1,000 mg by mouth every 6 (six) hours as needed for moderate pain.    Yes [provider]  Cholecalciferol (VITAMIN D3) 125 MCG (5000 UT) TABS Take 2 tablets by mouth daily.   Yes [provider]  diclofenac sodium (VOLTAREN) 1 % GEL Apply topically to affected area qid Patient taking differently: Apply 2 g topically 4 (four) times daily as needed (pain). Apply topically to  affected area qid prn for pain 12/29/17  Yes Andrena MewsRigby, [name], DO  dicyclomine (BENTYL) 20 MG tablet Take 1 tablet (20 mg total) by mouth 2 (two) times daily. 01/05/19  Yes Henderly, Britni A, PA-C  escitalopram (LEXAPRO) 20 MG tablet Take 1 tablet (20 mg total) by mouth daily. 04/20/18  Yes Helane RimaWallace, Erica, DO  gabapentin (NEURONTIN) 100 MG capsule 100mg  in the morning and 200 mg at night Patient taking differently: Take 100-200 mg by mouth 2 (two) times daily. 100mg  in the morning and 200 mg at night 08/17/18  Yes Helane RimaWallace, Erica, DO  linaclotide (LINZESS) 72 MCG capsule Take 1 capsule (72 mcg total) by mouth daily. 07/06/18  Yes Rachael FeeJacobs, Daniel P, MD  Lisdexamfetamine Dimesylate (VYVANSE) 60 MG CHEW Chew 60 mg by mouth daily. 08/17/18  Yes Helane RimaWallace, Erica, DO  LORazepam (ATIVAN) 1 MG tablet Take 1 tablet (1 mg total) by mouth 2 (two) times daily as needed for anxiety. Patient taking differently: Take 1 mg by mouth 2 (two) times daily.  11/26/18  Yes Helane RimaWallace, Erica, DO  Menaquinone-7 (VITAMIN K2 PO) Take 1 capsule by mouth daily.    Yes [provider]  Multiple Vitamins-Minerals (MULTIVITAMIN ADULT EXTRA C PO) Take 1 tablet by mouth daily.    Yes [provider]  pantoprazole (PROTONIX) 40 MG tablet Take 1 tablet (40 mg total) by mouth daily. 07/20/18  Yes Helane RimaWallace, Erica, DO  CALCIUM-MAGNESIUM-ZINC PO Take 3 tablets by mouth daily.    [provider]  HYDROcodone-acetaminophen (NORCO/VICODIN) 5-325 MG tablet Take 1 tablet by mouth every 6 (six) hours as needed for severe pain. 01/05/19   Henderly, Britni A, PA-C  LORazepam (ATIVAN) 1 MG tablet Take 1 tablet (1 mg total) by mouth 2 (two) times daily as needed for anxiety. 12/27/18   Helane RimaWallace, Erica, DO  metoCLOPramide (REGLAN) 10 MG tablet Take 1 tablet (10 mg total) by mouth every 6 (six) hours as needed for nausea (nausea/headache). Patient not taking: Reported on 01/05/2019 07/02/18   Pricilla LovelessGoldston, Scott, MD  Multiple Vitamins-Iron  (ONE-TABLET-DAILY/IRON PO) Take 1 tablet by mouth daily.    [provider]  ondansetron (ZOFRAN ODT) 4 MG disintegrating tablet Take 1 tablet (4 mg total) by mouth every 8 (eight) hours as needed for nausea or vomiting. 01/05/19   Henderly, Britni A, PA-C  oxyCODONE-acetaminophen (PERCOCET/ROXICET) 5-325 MG tablet Take 1-2 tablets by mouth every 4 (four) hours as needed for severe pain. Patient not taking: Reported on 11/24/2018 11/11/18   Helane RimaWallace, Erica, DO  promethazine (PHENERGAN) 25 MG tablet Take 1 tablet (25 mg total) by  mouth every 6 (six) hours as needed for nausea. 01/10/19   Benjiman CorePickering, Lyall Faciane, MD  valACYclovir (VALTREX) 500 MG tablet 2 po in am and 2 po in pm x 1 day. Patient taking differently: Take 500 mg by mouth daily as needed (cold sores). 2 po in am and 2 po in pm x 1 day. 07/20/18   Helane RimaWallace, Erica, DO    Family History Family History  Problem Relation Age of Onset  . Hyperlipidemia Father   . Hypertension Father   . Kidney disease Father   . Colon cancer Father 1250  . Hypertension Mother   . Hypertension Maternal Grandmother   . Uterine cancer Maternal Grandmother [id number]  . Diabetes Maternal Grandfather     Social History Social History   Tobacco Use  . Smoking status: Never Smoker  . Smokeless tobacco: Never Used  Substance Use Topics  . Alcohol use: No    Comment: rare/socially  . Drug use: No     Allergies   Nsaids   Review of Systems Review of Systems  Constitutional: Negative for appetite change.  Respiratory: Negative for shortness of breath.   Cardiovascular: Negative for chest pain.  Gastrointestinal: Positive for abdominal pain, nausea and vomiting.  Genitourinary: Negative for flank pain.  Musculoskeletal: Negative for back pain.  Skin: Negative for rash.  Neurological: Negative for weakness.  Hematological: Negative for adenopathy.  Psychiatric/Behavioral: Negative for confusion.     Physical Exam Updated Vital Signs BP (!) 145/61    Pulse 67   Temp 99 F (37.2 C) (Oral)   Resp 15   Ht 5\' 4"  (1.626 m)   Wt 115.7 kg   SpO2 98%   BMI 43.77 kg/m   Physical Exam Vitals signs and nursing note reviewed.  HENT:     Head: Atraumatic.  Cardiovascular:     Rate and Rhythm: Regular rhythm.  Pulmonary:     Breath sounds: Normal breath sounds.  Abdominal:     Palpations: Abdomen is soft.     Tenderness: There is no abdominal tenderness.     Hernia: No hernia is present.  Skin:    General: Skin is warm.     Capillary Refill: Capillary refill takes less than 2 seconds.  Neurological:     General: No focal deficit present.     Mental Status: She is alert.      ED Treatments / Results  Labs (all labs ordered are listed, but only abnormal results are displayed) Labs Reviewed  COMPREHENSIVE METABOLIC PANEL - Abnormal; Notable for the following components:      Result Value   Alkaline Phosphatase 162 (*)    Total Bilirubin 0.2 (*)    All other components within normal limits  URINALYSIS, ROUTINE W REFLEX MICROSCOPIC - Abnormal; Notable for the following components:   Leukocytes,Ua TRACE (*)    All other components within normal limits  LIPASE, BLOOD  CBC  I-STAT BETA HCG BLOOD, ED ([facility], [facility], [facility] ONLY)    EKG None  Radiology No results found.  Procedures Procedures (including critical care time)  Medications Ordered in ED Medications  ondansetron (ZOFRAN-ODT) disintegrating tablet 4 mg (4 mg Oral Given 01/10/19 1530)  promethazine (PHENERGAN) injection 25 mg (25 mg Intramuscular Given 01/10/19 2006)     Initial Impression / Assessment and Plan / ED Course  I have reviewed the triage vital signs and the nursing notes.  Pertinent labs & imaging results that were available during my care of the patient were reviewed by  me and considered in my medical decision making (see chart for details).       Patient with acute on chronic abdominal pain.  Recently had CT and ultrasound.  Ultrasound showed  potentially biliary sludge.  Otherwise reassuring.  Lab work reassuring today.  Feels better after Phenergan.  Will discharge home with same.  Has outpatient follow-up with gastroenterology.  She states they have discussed about a HIDA scan previously.  Rather benign abdominal exam now.  Discharge home.  Final Clinical Impressions(s) / ED Diagnoses   Final diagnoses:  Epigastric pain  Non-intractable vomiting with nausea, unspecified vomiting type    ED Discharge Orders         Ordered    promethazine (PHENERGAN) 25 MG tablet  Every 6 hours PRN     01/10/19 2058           Davonna Belling, MD 01/10/19 2156

## 2019-01-11 ENCOUNTER — Other Ambulatory Visit: Payer: Self-pay

## 2019-01-11 ENCOUNTER — Telehealth: Payer: Self-pay | Admitting: *Deleted

## 2019-01-11 ENCOUNTER — Inpatient Hospital Stay: Payer: No Typology Code available for payment source | Attending: Internal Medicine | Admitting: Hematology

## 2019-01-11 MED ORDER — CHOLECALCIFEROL 1.25 MG (50000 UT) PO TABS: ORAL_TABLET | ORAL | 0 refills | Status: DC

## 2019-01-11 MED FILL — PROMETHAZINE 25 MG TABLET: 25 | 3 days supply | Qty: 10 | Fill #0

## 2019-01-11 MED FILL — VITAMIN D3 50000 UNIT CAPS: 1.25 MG | 84 days supply | Qty: 12 | Fill #0

## 2019-01-11 NOTE — Telephone Encounter (Signed)
Attempted to contact patient r/t missed New Patient appt 7/28 at 10am. Left voice mail with [facility] number asking for patient to contact to reschedule at her convenience.

## 2019-01-11 NOTE — Telephone Encounter (Signed)
Sent my chart to check on patient and f/u with other open message.

## 2019-01-25 ENCOUNTER — Ambulatory Visit: Payer: No Typology Code available for payment source | Admitting: Physician Assistant

## 2019-01-25 ENCOUNTER — Encounter: Payer: Self-pay | Admitting: Physician Assistant

## 2019-01-25 ENCOUNTER — Other Ambulatory Visit: Payer: Self-pay | Admitting: Family Medicine

## 2019-01-25 VITALS — BP 122/90 | HR 114 | Temp 98.2°F | Ht 64.0 in | Wt 254.0 lb

## 2019-01-25 DIAGNOSIS — R112 Nausea with vomiting, unspecified: Secondary | ICD-10-CM

## 2019-01-25 DIAGNOSIS — K5900 Constipation, unspecified: Secondary | ICD-10-CM

## 2019-01-25 DIAGNOSIS — R1011 Right upper quadrant pain: Secondary | ICD-10-CM

## 2019-01-25 DIAGNOSIS — F4321 Adjustment disorder with depressed mood: Secondary | ICD-10-CM

## 2019-01-25 DIAGNOSIS — R945 Abnormal results of liver function studies: Secondary | ICD-10-CM | POA: Diagnosis not present

## 2019-01-25 DIAGNOSIS — R7989 Other specified abnormal findings of blood chemistry: Secondary | ICD-10-CM

## 2019-01-25 DIAGNOSIS — R110 Nausea: Secondary | ICD-10-CM

## 2019-01-25 MED ORDER — DICYCLOMINE HCL 20 MG PO TABS: 20.0000 mg | ORAL_TABLET | Freq: Two times a day (BID) | ORAL | 0 refills | Status: DC

## 2019-01-25 MED ORDER — PANTOPRAZOLE SODIUM 40 MG PO TBEC: 40.0000 mg | DELAYED_RELEASE_TABLET | Freq: Every day | ORAL | 3 refills | Status: DC

## 2019-01-25 MED ORDER — TRAMADOL HCL 50 MG PO TABS: 50.0000 mg | ORAL_TABLET | Freq: Four times a day (QID) | ORAL | 0 refills | Status: DC | PRN

## 2019-01-25 MED FILL — traMADol HCL 50 MG TABS: 50 | 3 days supply | Qty: 15 | Fill #0

## 2019-01-25 MED FILL — DICYCLOMINE 20 MG TABLET: 20 | 10 days supply | Qty: 20 | Fill #0

## 2019-01-25 MED FILL — PANTOPRAZOLE SOD DR 40 MG T: 40 | 30 days supply | Qty: 30 | Fill #0

## 2019-01-25 MED FILL — PROMETHAZINE 25 MG TABLET: 25 | 3 days supply | Qty: 10 | Fill #0

## 2019-01-25 NOTE — Telephone Encounter (Signed)
Last OV: 11/11/18 Next OV: none scheduled at this time PMP website checked:  Last filled on 12/27/18

## 2019-01-25 NOTE — Progress Notes (Signed)
Subjective:    Patient ID: [name] [name], female    DOB: December 18, 1985, 33 y.o.   MRN: [id number]  HPI [name] [name] is a pleasant 33 year old white female, established with Dr. Ardis Hughs who was last seen in January 2020.  She comes in today with complaints of recurring abdominal pain and nausea.  She has history of prior Roux-en-Y gastric bypass, iron deficiency anemia, anxiety/depression and nephrolithiasis.  Also with chronic constipation and family history of colon cancer in her father. Patient underwent colonoscopy in February 2020 which was a normal exam.  She is indicated for follow-up at age 33. EGD February 2020 showed mild gastritis, biopsies were negative for H. pylori. She has been maintained on Protonix 40 mg p.o. daily, and has been taking Linzess 72 mcg daily for constipation. She says after she underwent the procedures in February she did well for a few months and then symptoms have "flared back up" over the past month or so.  She denies any aspirin or NSAID use.  She states she is having symptoms about 3 or 4 days/week usually starting in the afternoon about an hour after lunch which is usually her larger meal.  She gets pain in the right upper quadrant, stabbing in nature may last for hours and at times radiates into her shoulder blade.  She generally gets nauseated with these episodes and has had rare bouts of vomiting.  No fever or chills. She has been having ongoing difficulty with constipation and occasionally having to use a suppository despite taking the Linzess. She had an ER visit in July 2020 with a particularly bad episode of the right upper quadrant pain and nausea.  Labs were reviewed and unremarkable other than alk phos of 147. CT of the abdomen and pelvis done July 2020 showed no gallstones or gallbladder wall thickening and a small hiatal hernia otherwise unremarkable.  Upper abdominal ultrasound July 2020 showed mild gallbladder sludge no pericholecystic fluid or wall  thickening, CBD 2.5 mm and mild changes of fatty liver disease.  Review of Systems Pertinent positive and negative review of systems were noted in the above HPI section.  All other review of systems was otherwise negative.  Outpatient Encounter Medications as of 01/25/2019  Medication Sig  . acetaminophen (TYLENOL) 500 MG tablet Take 1,000 mg by mouth every 6 (six) hours as needed for moderate pain.   Marland Kitchen CALCIUM-MAGNESIUM-ZINC PO Take 3 tablets by mouth daily.  . Cholecalciferol (VITAMIN D3) 125 MCG (5000 UT) TABS Take 2 tablets by mouth daily.  . Cholecalciferol 1.25 MG (50000 UT) TABS 50,000 units PO qwk for 12 weeks.  . diclofenac sodium (VOLTAREN) 1 % GEL Apply topically to affected area qid (Patient taking differently: Apply 2 g topically 4 (four) times daily as needed (pain). Apply topically to affected area qid prn for pain)  . dicyclomine (BENTYL) 20 MG tablet Take 1 tablet (20 mg total) by mouth 2 (two) times daily.  Marland Kitchen escitalopram (LEXAPRO) 20 MG tablet Take 1 tablet (20 mg total) by mouth daily.  Marland Kitchen gabapentin (NEURONTIN) 100 MG capsule 167m in the morning and 200 mg at night (Patient taking differently: Take 100-200 mg by mouth 2 (two) times daily. 1060min the morning and 200 mg at night)  . linaclotide (LINZESS) 72 MCG capsule Take 1 capsule (72 mcg total) by mouth daily.  . Lisdexamfetamine Dimesylate (VYVANSE) 60 MG CHEW Chew 60 mg by mouth daily.  . Marland KitchenORazepam (ATIVAN) 1 MG tablet Take 1 tablet (1 mg total) by  mouth 2 (two) times daily as needed for anxiety.  . Menaquinone-7 (VITAMIN K2 PO) Take 1 capsule by mouth daily.   . Multiple Vitamins-Iron (ONE-TABLET-DAILY/IRON PO) Take 1 tablet by mouth daily.  . Multiple Vitamins-Minerals (MULTIVITAMIN ADULT EXTRA C PO) Take 1 tablet by mouth daily.   . ondansetron (ZOFRAN ODT) 4 MG disintegrating tablet Take 1 tablet (4 mg total) by mouth every 8 (eight) hours as needed for nausea or vomiting.  . pantoprazole (PROTONIX) 40 MG tablet  Take 1 tablet (40 mg total) by mouth daily.  . promethazine (PHENERGAN) 25 MG tablet Take 1 tablet (25 mg total) by mouth every 6 (six) hours as needed for nausea.  . valACYclovir (VALTREX) 500 MG tablet 2 po in am and 2 po in pm x 1 day. (Patient taking differently: Take 500 mg by mouth daily as needed (cold sores). 2 po in am and 2 po in pm x 1 day.)  . [DISCONTINUED] dicyclomine (BENTYL) 20 MG tablet Take 1 tablet (20 mg total) by mouth 2 (two) times daily.  . [DISCONTINUED] pantoprazole (PROTONIX) 40 MG tablet Take 1 tablet (40 mg total) by mouth daily.  . traMADol (ULTRAM) 50 MG tablet Take 1 tablet (50 mg total) by mouth every 6 (six) hours as needed.  . [DISCONTINUED] HYDROcodone-acetaminophen (NORCO/VICODIN) 5-325 MG tablet Take 1 tablet by mouth every 6 (six) hours as needed for severe pain.  . [DISCONTINUED] LORazepam (ATIVAN) 1 MG tablet Take 1 tablet (1 mg total) by mouth 2 (two) times daily as needed for anxiety. (Patient taking differently: Take 1 mg by mouth 2 (two) times daily. )  . [DISCONTINUED] metoCLOPramide (REGLAN) 10 MG tablet Take 1 tablet (10 mg total) by mouth every 6 (six) hours as needed for nausea (nausea/headache). (Patient not taking: Reported on 01/05/2019)  . [DISCONTINUED] oxyCODONE-acetaminophen (PERCOCET/ROXICET) 5-325 MG tablet Take 1-2 tablets by mouth every 4 (four) hours as needed for severe pain. (Patient not taking: Reported on 11/24/2018)   Facility-Administered Encounter Medications as of 01/25/2019  Medication  . 0.9 %  sodium chloride infusion   Allergies  Allergen Reactions  . Nsaids     Gastric bypass   Patient Active Problem List   Diagnosis Date Noted  . Abnormal serum level of alkaline phosphatase 11/21/2018  . Pain in both lower extremities 11/21/2018  . Nocturnal leg cramps 10/26/2018  . Tachycardia 10/26/2018  . Bilateral lower extremity edema 10/26/2018  . Low serum ferritin level 10/26/2018  . Abdominal pain 08/17/2018  .  Nephrolithiasis   . Bleeding diathesis (HCC) 10/13/2017  . Depressive disorder 07/09/2017  . Dysmenorrhea 07/09/2017  . Menorrhagia 07/09/2017  . Reduced libido, due to Lexapro 07/09/2017  . History of cesarean section 07/09/2017  . Binge eating disorder 04/23/2017  . Morbid obesity (HCC) 04/23/2017  . 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 Pes planus 07/16/2016  . Hallux varus, acquired, right 03/18/2016  . Iron deficiency anemia 02/22/2015  . 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 Anxiety 04/10/2010   Social History   Socioeconomic History  . Marital status: Married    Spouse name: Not on file  . Number of children: Not on file  . Years of education: Not on file  . Highest education level: Not on file  Occupational History  . Not on file  Social Needs  . Financial resource strain: Not on file  . Food insecurity    Worry: Not on file    Inability: Not on file  .   Transportation needs    Medical: Not on file    Non-medical: Not on file  Tobacco Use  . Smoking status: Never Smoker  . Smokeless tobacco: Never Used  Substance and Sexual Activity  . Alcohol use: No    Comment: rare/socially  . Drug use: No  . Sexual activity: Yes    Birth control/protection: Other-see comments    Comment: tubal ligation  Lifestyle  . Physical activity    Days per week: Not on file    Minutes per session: Not on file  . Stress: Not on file  Relationships  . Social connections    Talks on phone: Not on file    Gets together: Not on file    Attends religious service: Not on file    Active member of club or organization: Not on file    Attends meetings of clubs or organizations: Not on file    Relationship status: Not on file  . Intimate partner violence    Fear of current or ex partner: Not on file    Emotionally abused: Not on file    Physically abused: Not on file    Forced sexual activity: Not on file  Other Topics Concern  . Not on file  Social History  Narrative   Married, lives with spouse and dtr born 07/2009. 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    Ms. Budzinski's family history includes Colon cancer (age of onset: 50) in her father; Diabetes in her maternal grandfather; Hyperlipidemia in her father; Hypertension in her father, maternal grandmother, and mother; Kidney disease in her father; Uterine cancer (age of onset: 76) in her maternal grandmother.      Objective:    Vitals:   01/25/19 1410  BP: 122/90  Pulse: (!) 114  Temp: 98.2 F (36.8 C)    Physical Exam Well-developed well-nourished, obese, young white female in no acute distress.  Accompanied by 2 young daughters height, Weight, 254 BMI 43.6  HEENT; nontraumatic normocephalic, EOMI, PER R LA, sclera anicteric. Oropharynx; Neck; supple, no JVD Cardiovascular; regular rate and rhythm with S1-S2, no murmur rub or gallop Pulmonary; Clear bilaterally Abdomen; soft, nontender, nondistended, no palpable mass or hepatosplenomegaly, bowel sounds are active Rectal; not done today Skin; benign exam, no jaundice rash or appreciable lesions Extremities; no clubbing cyanosis or edema skin warm and dry Neuro/Psych; alert and oriented x4, grossly nonfocal mood and affect appropriate       Assessment & Plan:   #1 32-year-old with 4 to 5-week history of recurrent episodes of epigastric and right upper quadrant pain associated with nausea.  These generally occur in the afternoon about an hour after lunch and pain may last for several hours.  Currently having 3-4 episodes per week. Etiology of pain and nausea or not clear, question biliary dyskinesia, biliary colic.  Ultrasound shows sludge but no stones. Recent EGD unremarkable save mild gastritis  #2 status post Roux-en-Y gastric bypass #3.  Chronic constipation-low-dose Linzess helpful but not alleviating #4 history of iron deficiency anemia #5.  Family history of colon cancer in father age 50 patient is up-to-date with  surveillance-negative colonoscopy February 2020 will have follow-up at age 38 #6 history of nephrolithiasis #7 chronically elevated alkaline phosphatase, recent GGT within normal limits-suggesting nonhepatic source  Plan; continue Protonix 40 mg p.o. every morning Have given patient samples of Linzess 145 mcg, if this is more effective than low-dose she will call back for prescription. Add Bentyl 10 mg every 6 hours as needed   abdominal pain. Will start Ultram/tramadol 50 mg every 6 hours only for severe episodes of pain as this may help her void ER visits.  #15/no refills  Patient will be scheduled for [facility] HIDA scan-further recommendations pending results.   S  PA-C 01/25/2019   Cc: Wallace, Erica, DO  

## 2019-01-25 NOTE — Patient Instructions (Signed)
You have been scheduled for a HIDA scan at [facility]iology (1st floor) on 02/08/19. Please arrive 15 minutes prior to your scheduled appointment at  1130. Make certain not to have anything to eat or drink at least 6 hours prior to your test. Should this appointment date or time not work well for you, please call radiology scheduling at [phone].  _____________________________________________________________________ hepatobiliary (HIDA) scan is an imaging procedure used to diagnose problems in the liver, gallbladder and bile ducts. In the HIDA scan, a radioactive chemical or tracer is injected into a vein in your arm. The tracer is handled by the liver like bile. Bile is a fluid produced and excreted by your liver that helps your digestive system break down fats in the foods you eat. Bile is stored in your gallbladder and the gallbladder releases the bile when you eat a meal. A special nuclear medicine scanner (gamma camera) tracks the flow of the tracer from your liver into your gallbladder and small intestine.  During your HIDA scan  You'll be asked to change into a hospital gown before your HIDA scan begins. Your health care team will position you on a table, usually on your back. The radioactive tracer is then injected into a vein in your arm.The tracer travels through your bloodstream to your liver, where it's taken up by the bile-producing cells. The radioactive tracer travels with the bile from your liver into your gallbladder and through your bile ducts to your small intestine.You may feel some pressure while the radioactive tracer is injected into your vein. As you lie on the table, a special gamma camera is positioned over your abdomen taking pictures of the tracer as it moves through your body. The gamma camera takes pictures continually for about an hour. You'll need to keep still during the HIDA scan. This can become uncomfortable, but you may find that you can lessen the discomfort by taking  deep breaths and thinking about other things. Tell your health care team if you're uncomfortable. The radiologist will watch on a computer the progress of the radioactive tracer through your body. The HIDA scan may be stopped when the radioactive tracer is seen in the gallbladder and enters your small intestine. This typically takes about an hour. In some cases extra imaging will be performed if original images aren't satisfactory, if morphine is given to help visualize the gallbladder or if the medication CCK is given to look at the contraction of the gallbladder. This test typically takes 2 hours to complete. ______________________________ Start Linzess 145 mg if this helps we will call in a prescription  Continue Protonix  Continue Bentyl 20mg  as needed  We have sent the following medications to your pharmacy for you to pick up at your convenience: Ultram 50 mg  Thank you for entrusting me with your care and choosing [facility].  Amy Esterwood __  _______________________________________

## 2019-01-26 MED ORDER — LORAZEPAM 1 MG PO TABS: 1.0000 mg | ORAL_TABLET | Freq: Two times a day (BID) | ORAL | 0 refills | Status: DC | PRN

## 2019-01-26 MED FILL — LORazepam 1 MG TABS: 1 | 30 days supply | Qty: 60 | Fill #0

## 2019-01-27 NOTE — Progress Notes (Signed)
I agree with the above note, plan 

## 2019-01-28 ENCOUNTER — Emergency Department (HOSPITAL_COMMUNITY): Payer: No Typology Code available for payment source

## 2019-01-28 ENCOUNTER — Emergency Department (HOSPITAL_COMMUNITY)
Admission: EM | Admit: 2019-01-28 | Discharge: 2019-01-29 | Disposition: A | Discharge status: Home / Self Care | Payer: No Typology Code available for payment source | Attending: Emergency Medicine | Admitting: Emergency Medicine

## 2019-01-28 ENCOUNTER — Encounter (HOSPITAL_COMMUNITY): Payer: Self-pay

## 2019-01-28 ENCOUNTER — Other Ambulatory Visit: Payer: Self-pay

## 2019-01-28 DIAGNOSIS — M79662 Pain in left lower leg: Secondary | ICD-10-CM | POA: Insufficient documentation

## 2019-01-28 DIAGNOSIS — R1030 Lower abdominal pain, unspecified: Secondary | ICD-10-CM | POA: Insufficient documentation

## 2019-01-28 DIAGNOSIS — E039 Hypothyroidism, unspecified: Secondary | ICD-10-CM | POA: Insufficient documentation

## 2019-01-28 DIAGNOSIS — R55 Syncope and collapse: Secondary | ICD-10-CM | POA: Diagnosis not present

## 2019-01-28 DIAGNOSIS — M542 Cervicalgia: Secondary | ICD-10-CM | POA: Diagnosis not present

## 2019-01-28 DIAGNOSIS — M549 Dorsalgia, unspecified: Secondary | ICD-10-CM | POA: Insufficient documentation

## 2019-01-28 DIAGNOSIS — R51 Headache: Secondary | ICD-10-CM | POA: Insufficient documentation

## 2019-01-28 DIAGNOSIS — M545 Low back pain: Secondary | ICD-10-CM | POA: Insufficient documentation

## 2019-01-28 DIAGNOSIS — M79661 Pain in right lower leg: Secondary | ICD-10-CM | POA: Diagnosis not present

## 2019-01-28 DIAGNOSIS — Z79899 Other long term (current) drug therapy: Secondary | ICD-10-CM | POA: Insufficient documentation

## 2019-01-28 DIAGNOSIS — R0789 Other chest pain: Secondary | ICD-10-CM | POA: Diagnosis not present

## 2019-01-28 LAB — COMPREHENSIVE METABOLIC PANEL
ALT: 21 U/L (ref 0–44)
AST: 27 U/L (ref 15–41)
Albumin: 3.8 g/dL (ref 3.5–5.0)
Alkaline Phosphatase: 154 U/L — ABNORMAL HIGH (ref 38–126)
Anion gap: 9 (ref 5–15)
BUN: 8 mg/dL (ref 6–20)
CO2: 23 mmol/L (ref 22–32)
Calcium: 8.5 mg/dL — ABNORMAL LOW (ref 8.9–10.3)
Chloride: 106 mmol/L (ref 98–111)
Creatinine, Ser: 0.57 mg/dL (ref 0.44–1.00)
GFR calc Af Amer: >60 mL/min (ref >60)
GFR calc non Af Amer: >60 mL/min (ref >60)
Glucose, Bld: 90 mg/dL (ref 70–99)
Potassium: 3.6 mmol/L (ref 3.5–5.1)
Sodium: 138 mmol/L (ref 135–145)
Total Bilirubin: 0.7 mg/dL (ref 0.3–1.2)
Total Protein: 6.5 g/dL (ref 6.5–8.1)

## 2019-01-28 LAB — CBC WITH DIFFERENTIAL/PLATELET
Abs Immature Granulocytes: 0.03 10*3/uL (ref 0.00–0.07)
Basophils Absolute: 0 10*3/uL (ref 0.0–0.1)
Basophils Relative: 0 %
Eosinophils Absolute: 0.1 10*3/uL (ref 0.0–0.5)
Eosinophils Relative: 1 %
HCT: 37.5 % (ref 36.0–46.0)
Hemoglobin: 12 g/dL (ref 12.0–15.0)
Immature Granulocytes: 0 %
Lymphocytes Relative: 26 %
Lymphs Abs: 2 10*3/uL (ref 0.7–4.0)
MCH: 26.8 pg (ref 26.0–34.0)
MCHC: 32 g/dL (ref 30.0–36.0)
MCV: 83.7 fL (ref 80.0–100.0)
Monocytes Absolute: 0.6 10*3/uL (ref 0.1–1.0)
Monocytes Relative: 7 %
Neutro Abs: 4.9 10*3/uL (ref 1.7–7.7)
Neutrophils Relative %: 66 %
Platelets: 261 10*3/uL (ref 150–400)
RBC: 4.48 MIL/uL (ref 3.87–5.11)
RDW: 14.9 % (ref 11.5–15.5)
WBC: 7.5 10*3/uL (ref 4.0–10.5)
nRBC: 0 % (ref 0.0–0.2)

## 2019-01-28 LAB — TSH: TSH: 1.711 u[IU]/mL (ref 0.350–4.500)

## 2019-01-28 LAB — URINALYSIS, ROUTINE W REFLEX MICROSCOPIC
Bilirubin Urine: NEGATIVE
Glucose, UA: NEGATIVE mg/dL
Hgb urine dipstick: NEGATIVE
Ketones, ur: NEGATIVE mg/dL
Nitrite: NEGATIVE
Protein, ur: NEGATIVE mg/dL
Specific Gravity, Urine: 1.012 (ref 1.005–1.030)
pH: 7 (ref 5.0–8.0)

## 2019-01-28 LAB — MAGNESIUM: Magnesium: 1.9 mg/dL (ref 1.7–2.4)

## 2019-01-28 LAB — LIPASE, BLOOD: Lipase: 25 U/L (ref 11–51)

## 2019-01-28 LAB — TROPONIN I (HIGH SENSITIVITY): Troponin I (High Sensitivity): <2 ng/L (ref <18)

## 2019-01-28 LAB — PREGNANCY, URINE: Preg Test, Ur: NEGATIVE

## 2019-01-28 LAB — LACTIC ACID, PLASMA: Lactic Acid, Venous: 1.3 mmol/L (ref 0.5–1.9)
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
## 2019-01-28 MED ORDER — MORPHINE SULFATE (PF) 4 MG/ML IV SOLN
4.0000 mg | Freq: Once | INTRAVENOUS | Status: AC
  Administered 2019-01-28: 20:00:00 4 mg via INTRAVENOUS
  Filled 2019-01-28: qty 1

## 2019-01-28 MED ORDER — IOHEXOL 300 MG/ML  SOLN
100.0000 mL | Freq: Once | INTRAMUSCULAR | Status: AC | PRN
  Administered 2019-01-28: 23:00:00 100 mL via INTRAVENOUS

## 2019-01-28 MED ORDER — SODIUM CHLORIDE 0.9 % IV BOLUS
1000.0000 mL | Freq: Once | INTRAVENOUS | Status: AC
  Administered 2019-01-28: 1000 mL via INTRAVENOUS

## 2019-01-28 MED ORDER — MORPHINE SULFATE (PF) 4 MG/ML IV SOLN
4.0000 mg | Freq: Once | INTRAVENOUS | Status: AC
  Administered 2019-01-28: 4 mg via INTRAVENOUS
  Filled 2019-01-28: qty 1

## 2019-01-28 NOTE — ED Notes (Signed)
Pt to CT via stretcher

## 2019-01-28 NOTE — ED Notes (Signed)
Upreg needs to be added per CT
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
## 2019-01-30 LAB — URINE CULTURE: Culture: 10000 — AB

## 2019-01-31 ENCOUNTER — Telehealth: Payer: Self-pay | Admitting: Emergency Medicine

## 2019-01-31 ENCOUNTER — Other Ambulatory Visit: Payer: Self-pay

## 2019-01-31 ENCOUNTER — Ambulatory Visit: Payer: No Typology Code available for payment source | Admitting: Family Medicine

## 2019-01-31 ENCOUNTER — Emergency Department (HOSPITAL_COMMUNITY)
Admission: EM | Admit: 2019-01-31 | Discharge: 2019-01-31 | Disposition: A | Discharge status: Home / Self Care | Payer: No Typology Code available for payment source | Attending: Emergency Medicine | Admitting: Emergency Medicine

## 2019-01-31 ENCOUNTER — Encounter: Payer: Self-pay | Admitting: Family Medicine

## 2019-01-31 ENCOUNTER — Encounter (HOSPITAL_COMMUNITY): Payer: Self-pay

## 2019-01-31 DIAGNOSIS — F411 Generalized anxiety disorder: Secondary | ICD-10-CM | POA: Diagnosis not present

## 2019-01-31 DIAGNOSIS — E039 Hypothyroidism, unspecified: Secondary | ICD-10-CM | POA: Diagnosis not present

## 2019-01-31 DIAGNOSIS — R45851 Suicidal ideations: Secondary | ICD-10-CM | POA: Insufficient documentation

## 2019-01-31 DIAGNOSIS — F329 Major depressive disorder, single episode, unspecified: Secondary | ICD-10-CM

## 2019-01-31 LAB — COMPREHENSIVE METABOLIC PANEL
ALT: 20 U/L (ref 0–44)
AST: 19 U/L (ref 15–41)
Albumin: 3.9 g/dL (ref 3.5–5.0)
Alkaline Phosphatase: 157 U/L — ABNORMAL HIGH (ref 38–126)
Anion gap: 7 (ref 5–15)
BUN: 14 mg/dL (ref 6–20)
CO2: 24 mmol/L (ref 22–32)
Calcium: 8.8 mg/dL — ABNORMAL LOW (ref 8.9–10.3)
Chloride: 107 mmol/L (ref 98–111)
Creatinine, Ser: 0.6 mg/dL (ref 0.44–1.00)
GFR calc Af Amer: >60 mL/min (ref >60)
GFR calc non Af Amer: >60 mL/min (ref >60)
Glucose, Bld: 90 mg/dL (ref 70–99)
Potassium: 4.1 mmol/L (ref 3.5–5.1)
Sodium: 138 mmol/L (ref 135–145)
Total Bilirubin: 0.3 mg/dL (ref 0.3–1.2)
Total Protein: 7.3 g/dL (ref 6.5–8.1)

## 2019-01-31 LAB — CBC WITH DIFFERENTIAL/PLATELET
Abs Immature Granulocytes: 0.03 10*3/uL (ref 0.00–0.07)
Basophils Absolute: 0 10*3/uL (ref 0.0–0.1)
Basophils Relative: 0 %
Eosinophils Absolute: 0.1 10*3/uL (ref 0.0–0.5)
Eosinophils Relative: 1 %
HCT: 40.5 % (ref 36.0–46.0)
Hemoglobin: 12.8 g/dL (ref 12.0–15.0)
Immature Granulocytes: 0 %
Lymphocytes Relative: 19 %
Lymphs Abs: 1.9 10*3/uL (ref 0.7–4.0)
MCH: 26.7 pg (ref 26.0–34.0)
MCHC: 31.6 g/dL (ref 30.0–36.0)
MCV: 84.6 fL (ref 80.0–100.0)
Monocytes Absolute: 0.7 10*3/uL (ref 0.1–1.0)
Monocytes Relative: 7 %
Neutro Abs: 7.4 10*3/uL (ref 1.7–7.7)
Neutrophils Relative %: 73 %
Platelets: 319 10*3/uL (ref 150–400)
RBC: 4.79 MIL/uL (ref 3.87–5.11)
RDW: 15.1 % (ref 11.5–15.5)
WBC: 10.1 10*3/uL (ref 4.0–10.5)
nRBC: 0 % (ref 0.0–0.2)

## 2019-01-31 LAB — I-STAT BETA HCG BLOOD, ED (MC, WL, AP ONLY): I-stat hCG, quantitative: <5 m[IU]/mL (ref <5)

## 2019-01-31 LAB — ETHANOL: Alcohol, Ethyl (B): <10 mg/dL (ref <10)

## 2019-01-31 MED ORDER — LISDEXAMFETAMINE DIMESYLATE 60 MG PO CHEW: 60.0000 mg | CHEWABLE_TABLET | Freq: Every day | ORAL | Status: DC

## 2019-01-31 MED ORDER — LINACLOTIDE 72 MCG PO CAPS: 72.0000 ug | ORAL_CAPSULE | Freq: Every day | ORAL | Status: DC

## 2019-01-31 MED ORDER — ESCITALOPRAM OXALATE 10 MG PO TABS: 20.0000 mg | ORAL_TABLET | Freq: Every day | ORAL | Status: DC

## 2019-01-31 MED ORDER — ONDANSETRON 4 MG PO TBDP: 4.0000 mg | ORAL_TABLET | Freq: Three times a day (TID) | ORAL | Status: DC | PRN

## 2019-01-31 MED ORDER — GABAPENTIN 100 MG PO CAPS: 100.0000 mg | ORAL_CAPSULE | Freq: Two times a day (BID) | ORAL | Status: DC

## 2019-01-31 MED ORDER — PANTOPRAZOLE SODIUM 40 MG PO TBEC: 40.0000 mg | DELAYED_RELEASE_TABLET | Freq: Every day | ORAL | Status: DC

## 2019-01-31 MED ORDER — ACETAMINOPHEN 500 MG PO TABS: 1000.0000 mg | ORAL_TABLET | Freq: Four times a day (QID) | ORAL | Status: DC | PRN

## 2019-01-31 NOTE — Telephone Encounter (Signed)
Post ED Visit - Positive Culture Follow-up  Culture report reviewed by antimicrobial stewardship pharmacist: [place] Team []  Elenor Quinones, Pharm.D. []  Heide Guile, Pharm.D., BCPS AQ-ID []  Parks Neptune, Pharm.D., BCPS []  Alycia Rossetti, Pharm.D., BCPS []  [place], [place].D., BCPS, AAHIVP []  Legrand Como, Pharm.D., BCPS, AAHIVP []  Salome Arnt, PharmD, BCPS []  Johnnette Gourd, PharmD, BCPS []  Hughes Better, PharmD, BCPS []  Leeroy Cha, PharmD []  Laqueta Linden, PharmD, BCPS []  Albertina Parr, PharmD Nicoletta Dress PharmD  [place] Team []  Leodis Sias, PharmD []  Lindell Spar, PharmD []  Royetta Asal, PharmD []  Graylin Shiver, Rph []  Rema Fendt) Glennon Mac, PharmD []  Arlyn Dunning, PharmD []  Netta Cedars, PharmD []  Dia Sitter, PharmD []  Leone Haven, PharmD []  Gretta Arab, PharmD []  Theodis Shove, PharmD []  Peggyann Juba, PharmD []  Reuel Boom, PharmD   Positive urine culture Treated with none, asymptomatic, no further patient follow-up is required at this time.  Hazle Nordmann 01/31/2019, 2:22 PM

## 2019-01-31 NOTE — ED Provider Notes (Addendum)
[facility]-EMERGENCY DEPT Provider Note   CSN: [id number] Arrival date & time: 01/31/19  1735     History   Chief Complaint Chief Complaint  Patient presents with  . Depression  . Anxiety    HPI [name] [name] is a 33 y.o. female.     33 year old female with history of anxiety depression here due to worsening symptoms.  She has passive suicidal ideations without definitive plan.  Denies any homicidal ideations.  Notes increased sleep.  Denies any intentional ingestion at this time.  No prior history of suicide attempt.  Denies responding to internal stimuli.  Has been feeling more depressed due to recent loss of multiple family members.     Past Medical History:  Diagnosis Date  . Anemia   . Anxiety   . DEPRESSION   . GERD (gastroesophageal reflux disease)   . Hypothyroidism    Hx of, normalized TSH during pregnancy 2011  . Infertility, female   . Migraines   . Morbid obesity (HCC)    s/p RY 08/2012 - start weight 290 pounds  . Nephrolithiasis   . PCOS (polycystic ovarian syndrome)   . PONV (postoperative nausea and vomiting)   . Pregnancy induced hypertension     Patient Active Problem List   Diagnosis Date Noted  . Abnormal serum level of alkaline phosphatase 11/21/2018  . Pain in both lower extremities 11/21/2018  . Nocturnal leg cramps 10/26/2018  . Tachycardia 10/26/2018  . Bilateral lower extremity edema 10/26/2018  . Low serum ferritin level 10/26/2018  . Abdominal pain 08/17/2018  . Nephrolithiasis   . Bleeding diathesis (HCC) 10/13/2017  . Depressive disorder 07/09/2017  . Dysmenorrhea 07/09/2017  . Menorrhagia 07/09/2017  . Reduced libido, due to Lexapro 07/09/2017  . History of cesarean section 07/09/2017  . Binge eating disorder 04/23/2017  . Morbid obesity (HCC) 04/23/2017  . 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 Pes planus 07/16/2016  . Hallux varus, acquired, right 03/18/2016  . Iron deficiency anemia  02/22/2015  . 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 Anxiety 04/10/2010    Past Surgical History:  Procedure Laterality Date  . Quintella ReichertIKEN OSTEOTOMY Left 06/24/2017   Procedure: Ralene BatheAIKEN OSTEOTOMY;  Surgeon: Vivi BarrackWagoner, Matthew R, DPM;  Location: [facility];  Service: Podiatry;  Laterality: Left;  . BUNIONECTOMY Left 06/24/2017   Procedure: Anthoney HaradaALSTON BUNIONECTOMY;  Surgeon: Vivi BarrackWagoner, Matthew R, DPM;  Location: [facility];  Service: Podiatry;  Laterality: Left;  . CESAREAN SECTION     x 2  . FOOT SURGERY    . 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   Procedure: LAPAROSCOPIC ROUX-EN-Y GASTRIC;  Surgeon: Atilano InaEric M Wilson, MD;  [facility]: [facility];  Service: General;  Laterality: N/A;  laparoscopic roux-en-y gastric bypass  . LITHOTRIPSY Left   . TONSILLECTOMY    . TUBAL LIGATION    . UPPER GI ENDOSCOPY  08/24/2012   Procedure: UPPER GI ENDOSCOPY;  Surgeon: Atilano InaEric M Wilson, MD;  Location: [facility];  Service: General;;  . WISDOM TOOTH EXTRACTION       OB History    Gravida  2   Para  2   Term  2   Preterm  0   AB  0   Living  2     SAB  0   TAB  0   Ectopic  0   Multiple  0   Live Births  2  Home Medications    Prior to Admission medications   Medication Sig Start Date End Date Taking? Authorizing Provider  acetaminophen (TYLENOL) 500 MG tablet Take 1,000 mg by mouth every 6 (six) hours as needed for moderate pain.     [provider]  CALCIUM-MAGNESIUM-ZINC PO Take 3 tablets by mouth daily.    [provider]  Cholecalciferol (VITAMIN D3) 125 MCG (5000 UT) TABS Take 2 tablets by mouth daily.    [provider]  Cholecalciferol 1.25 MG (50000 UT) TABS 50,000 units PO qwk for 12 weeks. 01/11/19   Briscoe Deutscher, DO  diclofenac sodium (VOLTAREN) 1 % GEL Apply topically to affected area qid Patient taking differently: Apply 2 g topically 4 (four) times daily as needed (pain). Apply topically to affected area qid prn for  pain 12/29/17   Gerda Diss, DO  dicyclomine (BENTYL) 20 MG tablet Take 1 tablet (20 mg total) by mouth 2 (two) times daily. 01/25/19   Esterwood, Amy S, PA-C  escitalopram (LEXAPRO) 20 MG tablet Take 1 tablet (20 mg total) by mouth daily. 04/20/18   Briscoe Deutscher, DO  gabapentin (NEURONTIN) 100 MG capsule 100mg  in the morning and 200 mg at night Patient taking differently: Take 100-200 mg by mouth 2 (two) times daily. 100mg  in the morning and 200 mg at night 08/17/18   Briscoe Deutscher, DO  linaclotide [facility]) 72 MCG capsule Take 1 capsule (72 mcg total) by mouth daily. 07/06/18   Milus Banister, MD  Lisdexamfetamine Dimesylate (VYVANSE) 60 MG CHEW Chew 60 mg by mouth daily. 08/17/18   Briscoe Deutscher, DO  LORazepam (ATIVAN) 1 MG tablet Take 1 tablet (1 mg total) by mouth 2 (two) times daily as needed for anxiety. 01/26/19   Briscoe Deutscher, DO  Menaquinone-7 (VITAMIN K2 PO) Take 1 capsule by mouth daily.     [provider]  Multiple Vitamins-Iron (ONE-TABLET-DAILY/IRON PO) Take 1 tablet by mouth daily.    [provider]  Multiple Vitamins-Minerals (MULTIVITAMIN ADULT EXTRA C PO) Take 1 tablet by mouth daily.     [provider]  ondansetron (ZOFRAN ODT) 4 MG disintegrating tablet Take 1 tablet (4 mg total) by mouth every 8 (eight) hours as needed for nausea or vomiting. 01/05/19   Henderly, Britni A, PA-C  pantoprazole (PROTONIX) 40 MG tablet Take 1 tablet (40 mg total) by mouth daily. 01/25/19   Esterwood, Amy S, PA-C  promethazine (PHENERGAN) 25 MG tablet Take 1 tablet (25 mg total) by mouth every 6 (six) hours as needed for nausea. 01/10/19   Davonna Belling, MD  traMADol (ULTRAM) 50 MG tablet Take 1 tablet (50 mg total) by mouth every 6 (six) hours as needed. 01/25/19   Esterwood, Amy S, PA-C  valACYclovir (VALTREX) 500 MG tablet 2 po in am and 2 po in pm x 1 day. Patient taking differently: Take 500 mg by mouth daily as needed (cold sores). 2 po in am and 2 po in pm x 1  day. 07/20/18   Briscoe Deutscher, DO    Family History Family History  Problem Relation Age of Onset  . Hyperlipidemia Father   . Hypertension Father   . Kidney disease Father   . Colon cancer Father 10  . Hypertension Mother   . Hypertension Maternal Grandmother   . Uterine cancer Maternal Grandmother 76  . Diabetes Maternal Grandfather     Social History Social History   Tobacco Use  . Smoking status: Never Smoker  . Smokeless tobacco:  Never Used  Substance Use Topics  . Alcohol use: No    Comment: rare/socially  . Drug use: No     Allergies   Nsaids   Review of Systems Review of Systems  All other systems reviewed and are negative.    Physical Exam Updated Vital Signs BP (!) 149/108 (BP Location: Right Arm)   Pulse (!) 106   Temp 98.3 F (36.8 C) (Oral)   Resp 16   Ht 1.626 m (5\' 4" )   Wt 115.2 kg   SpO2 100%   BMI 43.60 kg/m   Physical Exam Vitals signs and nursing note reviewed.  Constitutional:      General: She is not in acute distress.    Appearance: Normal appearance. She is well-developed. She is not toxic-appearing.  HENT:     Head: Normocephalic and atraumatic.  Eyes:     General: Lids are normal.     Conjunctiva/sclera: Conjunctivae normal.     Pupils: Pupils are equal, round, and reactive to light.  Neck:     Musculoskeletal: Normal range of motion and neck supple.     Thyroid: No thyroid mass.     Trachea: No tracheal deviation.  Cardiovascular:     Rate and Rhythm: Normal rate and regular rhythm.     Heart sounds: Normal heart sounds. No murmur. No gallop.   Pulmonary:     Effort: Pulmonary effort is normal. No respiratory distress.     Breath sounds: Normal breath sounds. No stridor. No decreased breath sounds, wheezing, rhonchi or rales.  Abdominal:     General: Bowel sounds are normal. There is no distension.     Palpations: Abdomen is soft.     Tenderness: There is no abdominal tenderness. There is no rebound.   Musculoskeletal: Normal range of motion.        General: No tenderness.  Skin:    General: Skin is warm and dry.     Findings: No abrasion or rash.  Neurological:     Mental Status: She is alert and oriented to person, place, and time.     GCS: GCS eye subscore is 4. GCS verbal subscore is 5. GCS motor subscore is 6.     Cranial Nerves: No cranial nerve deficit.     Sensory: No sensory deficit.  Psychiatric:        Attention and Perception: Attention normal.        Mood and Affect: Mood is depressed. Affect is flat.        Speech: Speech is delayed.        Behavior: Behavior is slowed.        Thought Content: Thought content includes suicidal ideation. Thought content does not include suicidal plan.      ED Treatments / Results  Labs (all labs ordered are listed, but only abnormal results are displayed) Labs Reviewed  ETHANOL  RAPID URINE DRUG SCREEN, HOSP PERFORMED  CBC WITH DIFFERENTIAL/PLATELET  COMPREHENSIVE METABOLIC PANEL  I-STAT BETA HCG BLOOD, ED ([facility], [facility], [facility] ONLY)  I-STAT BETA HCG BLOOD, ED ([facility], [facility], [facility] ONLY)    EKG None  Radiology No results found.  Procedures Procedures (including critical care time)  Medications Ordered in ED Medications  acetaminophen (TYLENOL) tablet 1,000 mg (has no administration in time range)  escitalopram (LEXAPRO) tablet 20 mg (has no administration in time range)  gabapentin (NEURONTIN) capsule 100-200 mg (has no administration in time range)  linaclotide (LINZESS) capsule 72 mcg (has no administration in time  range)  Lisdexamfetamine Dimesylate CHEW 60 mg (has no administration in time range)  pantoprazole (PROTONIX) EC tablet 40 mg (has no administration in time range)  ondansetron (ZOFRAN-ODT) disintegrating tablet 4 mg (has no administration in time range)     Initial Impression / Assessment and Plan / ED Course  I have reviewed the triage vital signs and the nursing notes.  Pertinent labs & imaging results that were  available during my care of the patient were reviewed by me and considered in my medical decision making (see chart for details).        Patient now medically cleared for psychiatric disposition  Patient seen by psychiatry who has recommended discharge  Final Clinical Impressions(s) / ED Diagnoses   Final diagnoses:  None    ED Discharge Orders    None       Lorre NickAllen, Shi Grose, MD 01/31/19 Kristopher [id number]    Lorre NickAllen, Eastyn Dattilo, MD 01/31/19 2127
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
## 2019-02-01 ENCOUNTER — Other Ambulatory Visit: Payer: Self-pay

## 2019-02-01 ENCOUNTER — Ambulatory Visit (INDEPENDENT_AMBULATORY_CARE_PROVIDER_SITE_OTHER): Payer: No Typology Code available for payment source | Admitting: Family Medicine

## 2019-02-01 VITALS — BP 136/88 | HR 113 | Temp 98.6°F | Ht 64.0 in | Wt 254.0 lb

## 2019-02-01 DIAGNOSIS — R55 Syncope and collapse: Secondary | ICD-10-CM | POA: Diagnosis not present

## 2019-02-01 DIAGNOSIS — F332 Major depressive disorder, recurrent severe without psychotic features: Secondary | ICD-10-CM

## 2019-02-01 DIAGNOSIS — G4730 Sleep apnea, unspecified: Secondary | ICD-10-CM | POA: Diagnosis not present

## 2019-02-01 DIAGNOSIS — G4700 Insomnia, unspecified: Secondary | ICD-10-CM

## 2019-02-01 MED ORDER — ALPRAZOLAM 1 MG PO TABS: 1.0000 mg | ORAL_TABLET | Freq: Two times a day (BID) | ORAL | 0 refills | Status: DC | PRN

## 2019-02-01 MED ORDER — LAMOTRIGINE 21 X 25 MG & 7 X 50 MG PO KIT: PACK | ORAL | 0 refills | Status: DC

## 2019-02-01 MED FILL — ALPRAZolam 1 MG TABS: 1 | 10 days supply | Qty: 20 | Fill #0

## 2019-02-01 NOTE — Patient Instructions (Signed)
Lamotrigine tablets What is this medicine? LAMOTRIGINE (la MOE tri jeen) is used to control seizures in adults and children with epilepsy and Lennox-Gastaut syndrome. It is also used in adults to treat bipolar disorder. This medicine may be used for other purposes; ask your health care provider or pharmacist if you have questions. COMMON BRAND NAME(S): Lamictal, Subvenite What should I tell my health care provider before I take this medicine? They need to know if you have any of these conditions:  aseptic meningitis during prior use of lamotrigine  depression  folate deficiency  kidney disease  liver disease  suicidal thoughts, plans, or attempt; a previous suicide attempt by you or a family member  an unusual or allergic reaction to lamotrigine or other seizure medications, other medicines, foods, dyes, or preservatives  pregnant or trying to get pregnant  breast-feeding How should I use this medicine? Take this medicine by mouth with a glass of water. Follow the directions on the prescription label. Do not chew these tablets. If this medicine upsets your stomach, take it with food or milk. Take your doses at regular intervals. Do not take your medicine more often than directed. A special MedGuide will be given to you by the pharmacist with each new prescription and refill. Be sure to read this information carefully each time. Talk to your pediatrician regarding the use of this medicine in children. While this drug may be prescribed for children as young as 2 years for selected conditions, precautions do apply. Overdosage: If you think you have taken too much of this medicine contact a poison control center or emergency room at once. NOTE: This medicine is only for you. Do not share this medicine with others. What if I miss a dose? If you miss a dose, take it as soon as you can. If it is almost time for your next dose, take only that dose. Do not take double or extra doses. What may  interact with this medicine?  atazanavir  carbamazepine  female hormones, including contraceptive or birth control pills  lopinavir  methotrexate  phenobarbital  phenytoin  primidone  pyrimethamine  rifampin  ritonavir  trimethoprim  valproic acid This list may not describe all possible interactions. Give your health care provider a list of all the medicines, herbs, non-prescription drugs, or dietary supplements you use. Also tell them if you smoke, drink alcohol, or use illegal drugs. Some items may interact with your medicine. What should I watch for while using this medicine? Visit your doctor or health care provider for regular checks on your progress. If you take this medicine for seizures, wear a Medic Alert bracelet or necklace. Carry an identification card with information about your condition, medicines, and doctor or health care provider. It is important to take this medicine exactly as directed. When first starting treatment, your dose will need to be adjusted slowly. It may take weeks or months before your dose is stable. You should contact your doctor or health care provider if your seizures get worse or if you have any new types of seizures. Do not stop taking this medicine unless instructed by your doctor or health care provider. Stopping your medicine suddenly can increase your seizures or their severity. This medicine may cause serious skin reactions. They can happen weeks to months after starting the medicine. Contact your health care provider right away if you notice fevers or flu-like symptoms with a rash. The rash may be red or purple and then turn into blisters or peeling   of the skin. Or, you might notice a red rash with swelling of the face, lips or lymph nodes in your neck or under your arms. You may get drowsy, dizzy, or have blurred vision. Do not drive, use machinery, or do anything that needs mental alertness until you know how this medicine affects you. To  reduce dizzy or fainting spells, do not sit or stand up quickly, especially if you are an older patient. Alcohol can increase drowsiness and dizziness. Avoid alcoholic drinks. If you are taking this medicine for bipolar disorder, it is important to report any changes in your mood to your doctor or health care provider. If your condition gets worse, you get mentally depressed, feel very hyperactive or manic, have difficulty sleeping, or have thoughts of hurting yourself or committing suicide, you need to get help from your health care provider right away. If you are a caregiver for someone taking this medicine for bipolar disorder, you should also report these behavioral changes right away. The use of this medicine may increase the chance of suicidal thoughts or actions. Pay special attention to how you are responding while on this medicine. Your mouth may get dry. Chewing sugarless gum or sucking hard candy, and drinking plenty of water may help. Contact your doctor if the problem does not go away or is severe. Women who become pregnant while using this medicine may enroll in the [place] Antiepileptic Drug Pregnancy Registry by calling 1-888-233-2334. This registry collects information about the safety of antiepileptic drug use during pregnancy. This medicine may cause a decrease in folic acid. You should make sure that you get enough folic acid while you are taking this medicine. Discuss the foods you eat and the vitamins you take with your health care provider. What side effects may I notice from receiving this medicine? Side effects that you should report to your doctor or health care professional as soon as possible:  allergic reactions like skin rash, itching or hives, swelling of the face, lips, or tongue  changes in vision  depressed mood  elevated mood, decreased need for sleep, racing thoughts, impulsive behavior  loss of balance or coordination  mouth sores  rash, fever, and  swollen lymph nodes  redness, blistering, peeling or loosening of the skin, including inside the mouth  right upper belly pain  seizures  severe muscle pain  signs and symptoms of aseptic meningitis such as stiff neck and sensitivity to light, headache, drowsiness, fever, nausea, vomiting, rash  signs of infection - fever or chills, cough, sore throat, pain or difficulty passing urine  suicidal thoughts or other mood changes  swollen lymph nodes  trouble walking  unusual bruising or bleeding  unusually weak or tired  yellowing of the eyes or skin Side effects that usually do not require medical attention (report to your doctor or health care professional if they continue or are bothersome):  diarrhea  dizziness  dry mouth  stuffy nose  tiredness  tremors  trouble sleeping This list may not describe all possible side effects. Call your doctor for medical advice about side effects. You may report side effects to FDA at 1-800-FDA-1088. Where should I keep my medicine? Keep out of reach of children. Store at room temperature between 15 and 30 degrees C (59 and 86 degrees F). Throw away any unused medicine after the expiration date. NOTE: This sheet is a summary. It may not cover all possible information. If you have questions about this medicine, talk to your doctor,   pharmacist, or health care provider.  2020 Elsevier/Gold Standard (2018-09-03 15:03:40)  

## 2019-02-01 NOTE — Telephone Encounter (Signed)
See note  Copied from [facility] [id number]. Topic: General - Other >> Feb 01, 2019  2:31 PM Leward Quan A wrote: Reason for CRM: Patient called to say that the Rx for lamoTRIgine 21 x 25 MG & 7 x 50 MG KIT in the brand name is not covered by her insurance. She is asking for it to be changed to the generic brand so that she can get her medicine and start taking it. Any questions patient can be reached at Ph# [phone]

## 2019-02-01 NOTE — Telephone Encounter (Signed)
Patient was seen today.
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
## 2019-02-02 ENCOUNTER — Encounter: Payer: Self-pay | Admitting: Family Medicine

## 2019-02-02 ENCOUNTER — Other Ambulatory Visit: Payer: Self-pay

## 2019-02-02 DIAGNOSIS — G4700 Insomnia, unspecified: Secondary | ICD-10-CM

## 2019-02-02 MED ORDER — LAMOTRIGINE 25 MG PO TABS: ORAL_TABLET | ORAL | 0 refills | Status: DC

## 2019-02-02 MED ORDER — LAMOTRIGINE 25 MG PO TABS: 50.0000 mg | ORAL_TABLET | Freq: Every day | ORAL | 2 refills | Status: DC

## 2019-02-02 MED FILL — lamoTRIgine 25 MG TABS: 25 | 29 days supply | Qty: 37 | Fill #0

## 2019-02-03 NOTE — Progress Notes (Signed)
Virtual Visit via Video   Due to the COVID-19 pandemic, this visit was completed with telemedicine (audio/video) technology to reduce patient and provider exposure as well as to preserve personal protective equipment.   I connected with Jonette Eva Luiz by a video enabled telemedicine application and verified that I am speaking with the correct person using two identifiers. Location patient: Home Location provider: [facility], Office Persons participating in the virtual visit: Sameen, Leas, DO   I discussed the limitations of evaluation and management by telemedicine and the availability of in person appointments. The patient expressed understanding and agreed to proceed.  Care Team   Patient Care Team: Briscoe Deutscher, DO as PCP - General (Family Medicine) Crawford Givens, MD as Referring Physician (Obstetrics and Gynecology) Himmelrich, Bryson Ha, RD (Inactive) as Dietitian Tia Masker)  Subjective:   HPI: Patient here for a follow up for anxiety.  Patient has started the Lamotrigine 25MG, PO, QD for 21 days then 2 QD after.  She was also given Xanax 1 mg nightly to regulate sleep.  She and her family both noticed a big improvement over the past several days.  No side effects with the lamotrigine.  Feels that it has really helped with mood stabilization already.  She has been taking the Xanax each night for the past several nights per our instructions.  She does hope to be able to use this more sparingly going forward.  Overall, doing well today.  Review of Systems  Constitutional: Negative for chills, fever, malaise/fatigue and weight loss.  Respiratory: Negative for cough, shortness of breath and wheezing.   Cardiovascular: Negative for chest pain, palpitations and leg swelling.  Gastrointestinal: Negative for abdominal pain, constipation, diarrhea, nausea and vomiting.  Genitourinary: Negative for dysuria and urgency.  Musculoskeletal: Negative for joint pain  and myalgias.  Skin: Negative for rash.  Neurological: Negative for dizziness and headaches.  Psychiatric/Behavioral: Positive for depression. Negative for substance abuse and suicidal ideas. The patient is nervous/anxious.     Patient Active Problem List   Diagnosis Date Noted  . Pain in right hand 12/02/2018  . Abnormal serum level of alkaline phosphatase 11/21/2018  . Pain in both lower extremities 11/21/2018  . Nocturnal leg cramps 10/26/2018  . Tachycardia 10/26/2018  . Bilateral lower extremity edema 10/26/2018  . Low serum ferritin level 10/26/2018  . Abdominal pain 08/17/2018  . Nephrolithiasis   . Bleeding diathesis ([place]) 10/13/2017  . Depressive disorder 07/09/2017  . Dysmenorrhea 07/09/2017  . Menorrhagia 07/09/2017  . Reduced libido, due to Lexapro 07/09/2017  . History of cesarean section 07/09/2017  . Binge eating disorder 04/23/2017  . Morbid obesity ([place]) 04/23/2017  . 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 Pes planus 07/16/2016  . Hallux varus, acquired, right 03/18/2016  . Iron deficiency anemia 02/22/2015  . 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 Anxiety 04/10/2010    Social History   Tobacco Use  . Smoking status: Never Smoker  . Smokeless tobacco: Never Used  Substance Use Topics  . Alcohol use: No    Comment: rare/socially    Current Outpatient Medications:  .  acetaminophen (TYLENOL) 500 MG tablet, Take 1,000 mg by mouth every 6 (six) hours as needed for moderate pain. , Disp: , Rfl:  .  ALPRAZolam (XANAX) 1 MG tablet, Take 1 tablet (1 mg total) by mouth 2 (two) times daily as needed for anxiety., Disp: 20 tablet, Rfl: 0 .  CALCIUM-MAGNESIUM-ZINC PO, Take 3 tablets by  mouth daily., Disp: , Rfl:  .  Cholecalciferol (VITAMIN D3) 125 MCG (5000 UT) TABS, Take 2 tablets by mouth daily., Disp: , Rfl:  .  Cholecalciferol 1.25 MG (50000 UT) TABS, 50,000 units PO qwk for 12 weeks., Disp: 12 tablet, Rfl: 0 .  dicyclomine (BENTYL) 20 MG  tablet, Take 1 tablet (20 mg total) by mouth 2 (two) times daily., Disp: 20 tablet, Rfl: 0 .  escitalopram (LEXAPRO) 20 MG tablet, Take 1 tablet (20 mg total) by mouth daily., Disp: 90 tablet, Rfl: 2 .  gabapentin (NEURONTIN) 100 MG capsule, 159m in the morning and 200 mg at night (Patient taking differently: Take 100-200 mg by mouth 2 (two) times daily. 1021min the morning and 200 mg at night), Disp: 90 capsule, Rfl: 2 .  lamoTRIgine (LAMICTAL) 25 MG tablet, Take one tab daily for 21 days then two daily after., Disp: 37 tablet, Rfl: 0 .  lamoTRIgine (LAMICTAL) 25 MG tablet, Take 2 tablets (50 mg total) by mouth daily., Disp: 60 tablet, Rfl: 2 .  lamoTRIgine 21 x 25 MG & 7 x 50 MG KIT, Per kit instructions., Disp: 1 kit, Rfl: 0 .  linaclotide (LINZESS) 72 MCG capsule, Take 1 capsule (72 mcg total) by mouth daily., Disp: 30 capsule, Rfl: 5 .  Menaquinone-7 (VITAMIN K2 PO), Take 1 capsule by mouth daily. , Disp: , Rfl:  .  Multiple Vitamins-Iron (ONE-TABLET-DAILY/IRON PO), Take 1 tablet by mouth daily., Disp: , Rfl:  .  Multiple Vitamins-Minerals (MULTIVITAMIN ADULT EXTRA C PO), Take 1 tablet by mouth daily. , Disp: , Rfl:  .  pantoprazole (PROTONIX) 40 MG tablet, Take 1 tablet (40 mg total) by mouth daily., Disp: 30 tablet, Rfl: 3 .  promethazine (PHENERGAN) 25 MG tablet, Take 1 tablet (25 mg total) by mouth every 6 (six) hours as needed for nausea., Disp: 10 tablet, Rfl: 0 .  valACYclovir (VALTREX) 500 MG tablet, 2 po in am and 2 po in pm x 1 day. (Patient taking differently: Take 500 mg by mouth daily as needed (cold sores). 2 po in am and 2 po in pm x 1 day.), Disp: 5 tablet, Rfl: 3 .  diclofenac sodium (VOLTAREN) 1 % GEL, Apply topically to affected area qid (Patient taking differently: Apply 2 g topically 4 (four) times daily as needed (pain). Apply topically to affected area qid prn for pain), Disp: 100 g, Rfl: 1  Allergies  Allergen Reactions  . Ambien [Zolpidem] Other (See Comments)     Sleep walking   . Nsaids     Gastric bypass    Objective:   VITALS: Per patient if applicable, see vitals. GENERAL: Alert, appears well and in no acute distress. HEENT: Atraumatic, conjunctiva clear, no obvious abnormalities on inspection of external nose and ears. NECK: Normal movements of the head and neck. CARDIOPULMONARY: No increased WOB. Speaking in clear sentences. I:E ratio WNL.  MS: Moves all visible extremities without noticeable abnormality. PSYCH: Pleasant and cooperative, well-groomed. Speech normal rate and rhythm. Affect is appropriate. Insight and judgement are appropriate. Attention is focused, linear, and appropriate.  NEURO: CN grossly intact. Oriented as arrived to appointment on time with no prompting. Moves both UE equally.  SKIN: No obvious lesions, wounds, erythema, or cyanosis noted on face or hands.  [facility] 08/17/2018 06/01/2018 04/20/2018  Decreased Interest _0 Down, Depressed, Hopeless 0 2 2  PHQ - 2 Score _1 Altered sleeping _2 Tired, decreased  energy _0 Change in appetite _1 Feeling bad or failure about yourself  0 0 0  Trouble concentrating 0 1 1  Moving slowly or fidgety/restless 0 0 1  Suicidal thoughts 0 0 0  PHQ-9 Score _2 Difficult doing work/chores - Very difficult Very difficult  Some recent data might be hidden   Assessment and Plan:   Aniya was seen today for follow-up and anxiety.  Diagnoses and all orders for this visit:  Severe episode of recurrent major depressive disorder, without psychotic features ([place]) Comments: Improving.  Continue current treatment.  We will follow-up in the next month.  Insomnia due to other mental disorder Comments: Improving.  We will see how this improves with management of depression and anxiety.    Marland Kitchen COVID-19 Education: The signs and symptoms of COVID-19 were discussed with the patient and how to seek care for testing if needed. The importance of social  distancing was discussed today. . Reviewed expectations re: course of current medical issues. . Discussed self-management of symptoms. . Outlined signs and symptoms indicating need for more acute intervention. . Patient verbalized understanding and all questions were answered. Marland Kitchen Health Maintenance issues including appropriate healthy diet, exercise, and smoking avoidance were discussed with patient. . See orders for this visit as documented in the electronic medical record.  Briscoe Deutscher, DO  Records requested if needed. Time spent: 15 minutes, of which >50% was spent in obtaining information about her symptoms, reviewing her previous labs, evaluations, and treatments, counseling her about her condition (please see the discussed topics above), and developing a plan to further investigate it; she had a number of questions which I addressed.

## 2019-02-04 ENCOUNTER — Encounter: Payer: Self-pay | Admitting: Family Medicine

## 2019-02-04 ENCOUNTER — Ambulatory Visit (INDEPENDENT_AMBULATORY_CARE_PROVIDER_SITE_OTHER): Payer: No Typology Code available for payment source | Admitting: Family Medicine

## 2019-02-04 VITALS — BP 120/77 | HR 89 | Ht 64.0 in | Wt 254.0 lb

## 2019-02-04 DIAGNOSIS — F332 Major depressive disorder, recurrent severe without psychotic features: Secondary | ICD-10-CM

## 2019-02-04 DIAGNOSIS — F99 Mental disorder, not otherwise specified: Secondary | ICD-10-CM | POA: Diagnosis not present

## 2019-02-04 DIAGNOSIS — F5105 Insomnia due to other mental disorder: Secondary | ICD-10-CM

## 2019-02-05 ENCOUNTER — Encounter: Payer: Self-pay | Admitting: Family Medicine

## 2019-02-08 ENCOUNTER — Ambulatory Visit (HOSPITAL_COMMUNITY)
Admission: RE | Admit: 2019-02-08 | Discharge: 2019-02-08 | Disposition: A | Discharge status: Home / Self Care | Payer: No Typology Code available for payment source | Source: Ambulatory Visit | Attending: Physician Assistant | Admitting: Physician Assistant

## 2019-02-08 ENCOUNTER — Other Ambulatory Visit: Payer: Self-pay

## 2019-02-08 DIAGNOSIS — K5900 Constipation, unspecified: Secondary | ICD-10-CM | POA: Diagnosis present

## 2019-02-08 DIAGNOSIS — R1011 Right upper quadrant pain: Secondary | ICD-10-CM | POA: Insufficient documentation

## 2019-02-08 DIAGNOSIS — R945 Abnormal results of liver function studies: Secondary | ICD-10-CM | POA: Diagnosis present

## 2019-02-08 DIAGNOSIS — R7989 Other specified abnormal findings of blood chemistry: Secondary | ICD-10-CM

## 2019-02-08 DIAGNOSIS — R112 Nausea with vomiting, unspecified: Secondary | ICD-10-CM | POA: Insufficient documentation

## 2019-02-08 DIAGNOSIS — R110 Nausea: Secondary | ICD-10-CM | POA: Diagnosis present

## 2019-02-08 IMAGING — NM NUCLEAR MEDICINE HEPATOBILIARY IMAGING WITH GALLBLADDER EF
2 series · 12 of 12 positions shown · non-contrast
Comparison: Ultrasound [DATE]

CLINICAL DATA: Abnormal LFTs.  Abdominal pain.

EXAM:
NUCLEAR MEDICINE HEPATOBILIARY IMAGING WITH GALLBLADDER EF
TECHNIQUE: Sequential images of the abdomen were obtained [DATE] minutes
following intravenous administration of radiopharmaceutical. After
oral ingestion of Ensure, gallbladder ejection fraction was
determined. At 60 min, normal ejection fraction is greater than 33%.
RADIOPHARMACEUTICALS:  5.3 mCi [CP]  Choletec IV

[Series 1: biliary · 4.14mm/px · 6 of 60 frames shown]
[frame 6/60]
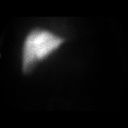
[frame 16/60]
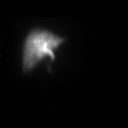
[frame 26/60]
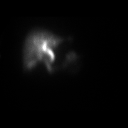
[frame 36/60]
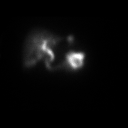
[frame 46/60]
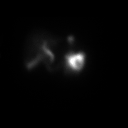
[frame 56/60]
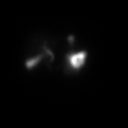

[Series 2: gbef · 4.14mm/px · 6 of 60 frames shown]
[frame 6/60]
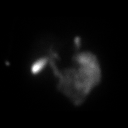
[frame 16/60]
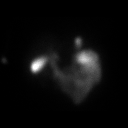
[frame 26/60]
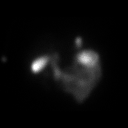
[frame 36/60]
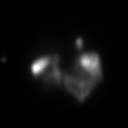
[frame 46/60]
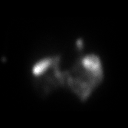
[frame 56/60]
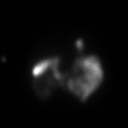

[12 of 12 positions shown; findings below may reference images not displayed]

FINDINGS: Prompt uptake and biliary excretion of activity by the liver is
seen. Gallbladder activity is visualized, consistent with patency of
cystic duct. Biliary activity passes into small bowel, consistent
with patent common bile duct.

Administration of fatty meal demonstrates minimal contraction of the
gallbladder.

Calculated gallbladder ejection fraction is 28%. (Normal gallbladder
ejection fraction with Ensure is greater than 33%.)
IMPRESSION: 1. No evidence of cholecystitis. Patent cystic duct and common bile
duct.
2. Low gallbladder ejection fraction = 28%. Most common differential
is chronic cholecystitis.

## 2019-02-08 MED ORDER — TECHNETIUM TC 99M MEBROFENIN IV KIT
5.2700 | PACK | Freq: Once | INTRAVENOUS | Status: AC | PRN
  Administered 2019-02-08: 12:00:00 5.27 via INTRAVENOUS

## 2019-02-09 ENCOUNTER — Institutional Professional Consult (permissible substitution): Payer: No Typology Code available for payment source | Admitting: Pulmonary Disease

## 2019-02-09 MED ORDER — ALPRAZOLAM 1 MG PO TABS: 1.0000 mg | ORAL_TABLET | Freq: Every day | ORAL | 3 refills | Status: DC

## 2019-02-09 MED FILL — ALPRAZolam 1 MG TABS: 1 | 10 days supply | Qty: 30 | Fill #0

## 2019-02-12 ENCOUNTER — Encounter (HOSPITAL_COMMUNITY): Payer: Self-pay | Admitting: Emergency Medicine

## 2019-02-12 ENCOUNTER — Emergency Department (HOSPITAL_COMMUNITY): Payer: No Typology Code available for payment source

## 2019-02-12 ENCOUNTER — Other Ambulatory Visit: Payer: Self-pay

## 2019-02-12 ENCOUNTER — Emergency Department (HOSPITAL_COMMUNITY)
Admission: EM | Admit: 2019-02-12 | Discharge: 2019-02-13 | Disposition: A | Discharge status: Home / Self Care | Payer: No Typology Code available for payment source | Attending: Emergency Medicine | Admitting: Emergency Medicine

## 2019-02-12 DIAGNOSIS — Z6841 Body Mass Index (BMI) 40.0 and over, adult: Secondary | ICD-10-CM | POA: Diagnosis not present

## 2019-02-12 DIAGNOSIS — E039 Hypothyroidism, unspecified: Secondary | ICD-10-CM | POA: Diagnosis not present

## 2019-02-12 DIAGNOSIS — G8929 Other chronic pain: Secondary | ICD-10-CM | POA: Diagnosis not present

## 2019-02-12 DIAGNOSIS — R1110 Vomiting, unspecified: Secondary | ICD-10-CM | POA: Diagnosis not present

## 2019-02-12 DIAGNOSIS — R1011 Right upper quadrant pain: Secondary | ICD-10-CM | POA: Diagnosis not present

## 2019-02-12 DIAGNOSIS — E669 Obesity, unspecified: Secondary | ICD-10-CM | POA: Diagnosis not present

## 2019-02-12 DIAGNOSIS — Z79899 Other long term (current) drug therapy: Secondary | ICD-10-CM | POA: Diagnosis not present

## 2019-02-12 LAB — URINALYSIS, ROUTINE W REFLEX MICROSCOPIC
Bilirubin Urine: NEGATIVE
Glucose, UA: NEGATIVE mg/dL
Hgb urine dipstick: NEGATIVE
Ketones, ur: NEGATIVE mg/dL
Nitrite: NEGATIVE
Protein, ur: NEGATIVE mg/dL
Specific Gravity, Urine: 1.011 (ref 1.005–1.030)
pH: 5 (ref 5.0–8.0)

## 2019-02-12 LAB — COMPREHENSIVE METABOLIC PANEL
ALT: 73 U/L — ABNORMAL HIGH (ref 0–44)
AST: 74 U/L — ABNORMAL HIGH (ref 15–41)
Albumin: 4 g/dL (ref 3.5–5.0)
Alkaline Phosphatase: 164 U/L — ABNORMAL HIGH (ref 38–126)
Anion gap: 9 (ref 5–15)
BUN: 10 mg/dL (ref 6–20)
CO2: 27 mmol/L (ref 22–32)
Calcium: 9.1 mg/dL (ref 8.9–10.3)
Chloride: 104 mmol/L (ref 98–111)
Creatinine, Ser: 0.57 mg/dL (ref 0.44–1.00)
GFR calc Af Amer: >60 mL/min (ref >60)
GFR calc non Af Amer: >60 mL/min (ref >60)
Glucose, Bld: 95 mg/dL (ref 70–99)
Potassium: 4.3 mmol/L (ref 3.5–5.1)
Sodium: 140 mmol/L (ref 135–145)
Total Bilirubin: 0.3 mg/dL (ref 0.3–1.2)
Total Protein: 7.7 g/dL (ref 6.5–8.1)

## 2019-02-12 LAB — CBC
HCT: 41.9 % (ref 36.0–46.0)
Hemoglobin: 12.9 g/dL (ref 12.0–15.0)
MCH: 26.5 pg (ref 26.0–34.0)
MCHC: 30.8 g/dL (ref 30.0–36.0)
MCV: 86 fL (ref 80.0–100.0)
Platelets: 343 10*3/uL (ref 150–400)
RBC: 4.87 MIL/uL (ref 3.87–5.11)
RDW: 15.1 % (ref 11.5–15.5)
WBC: 6.9 10*3/uL (ref 4.0–10.5)
nRBC: 0 % (ref 0.0–0.2)

## 2019-02-12 LAB — I-STAT BETA HCG BLOOD, ED (MC, WL, AP ONLY): I-stat hCG, quantitative: <5 m[IU]/mL (ref <5)

## 2019-02-12 LAB — LIPASE, BLOOD: Lipase: 27 U/L (ref 11–51)

## 2019-02-12 IMAGING — US ULTRASOUND ABDOMEN LIMITED
1 series · 14 of 25 positions shown · non-contrast
Comparison: CT of the abdomen pelvis dated [DATE]

CLINICAL DATA: 32-year-old female with right upper quadrant
abdominal pain.

EXAM:
ULTRASOUND ABDOMEN LIMITED RIGHT UPPER QUADRANT

[Series 1: ultrasound abdomen limited · 14 of 40 slices shown]
[im 1/40]
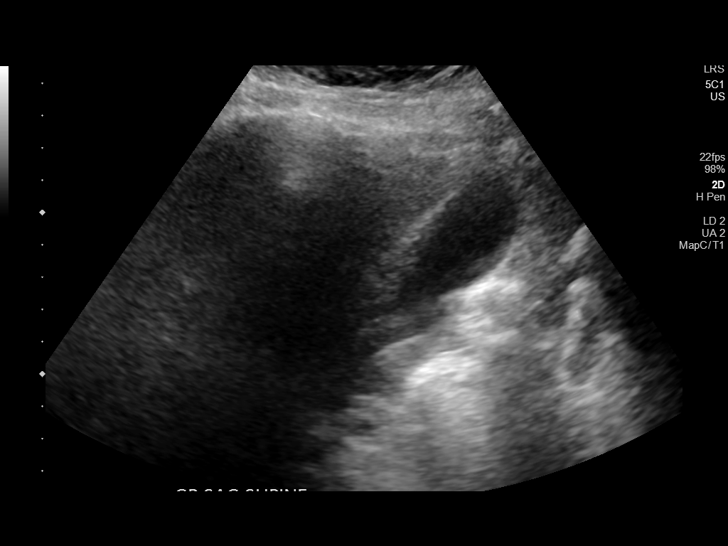
[im 4/40]
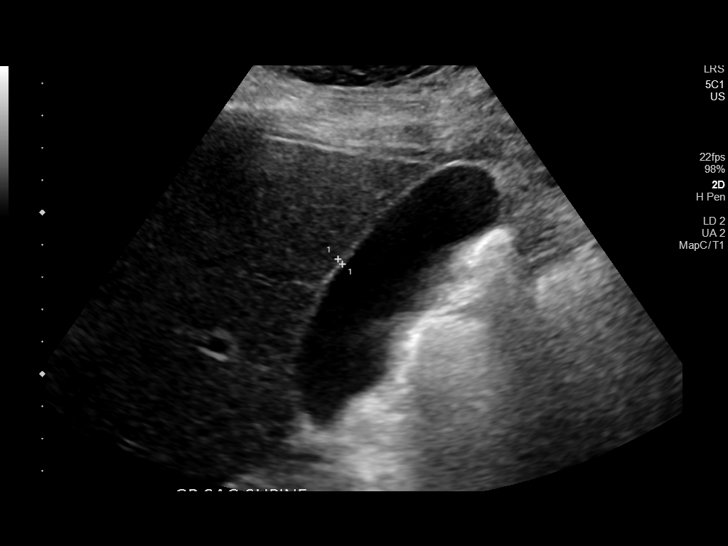
[im 7/40]
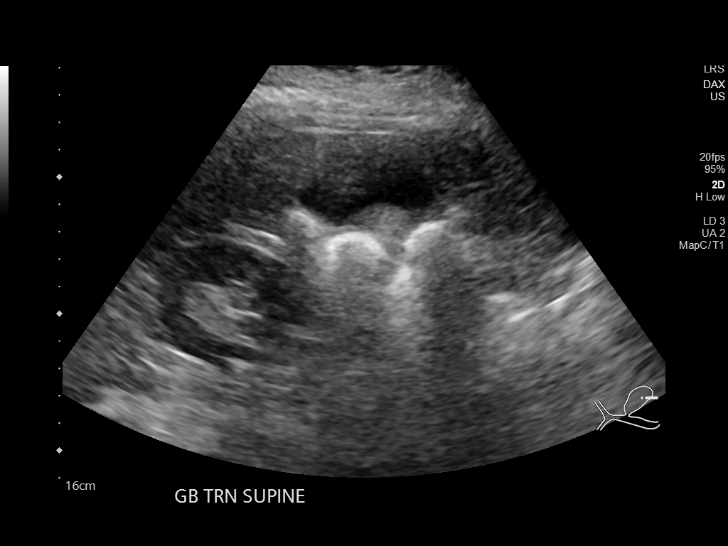
[im 10/40]
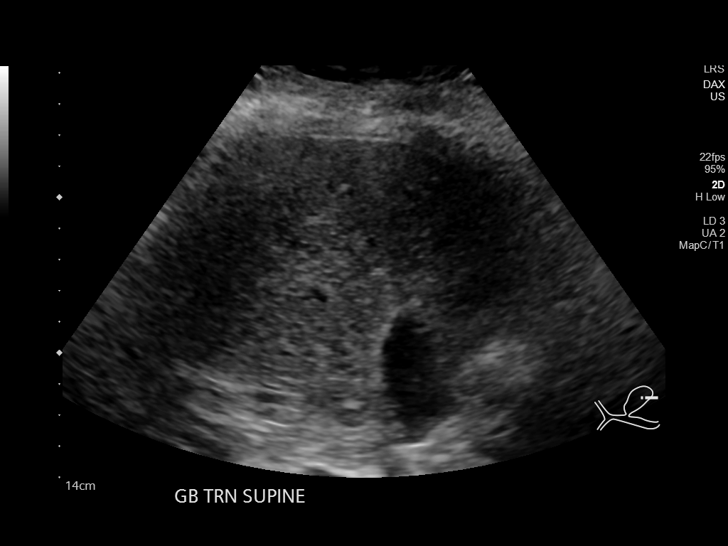
[im 14/40]
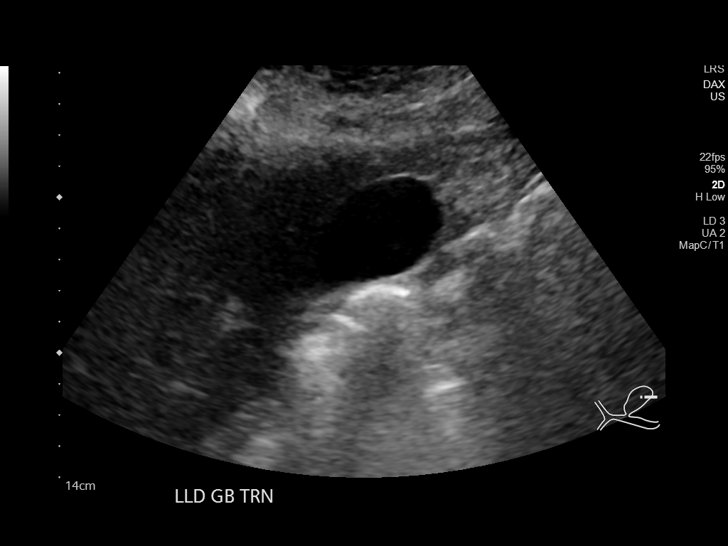
[im 15/40]
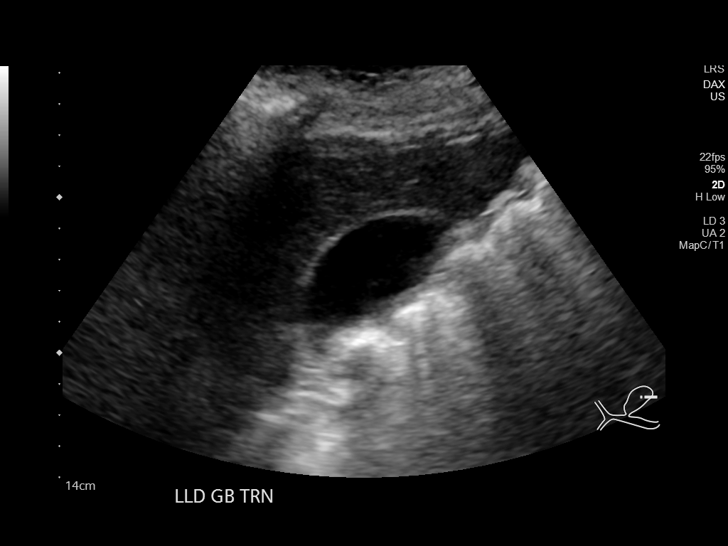
[im 18/40]
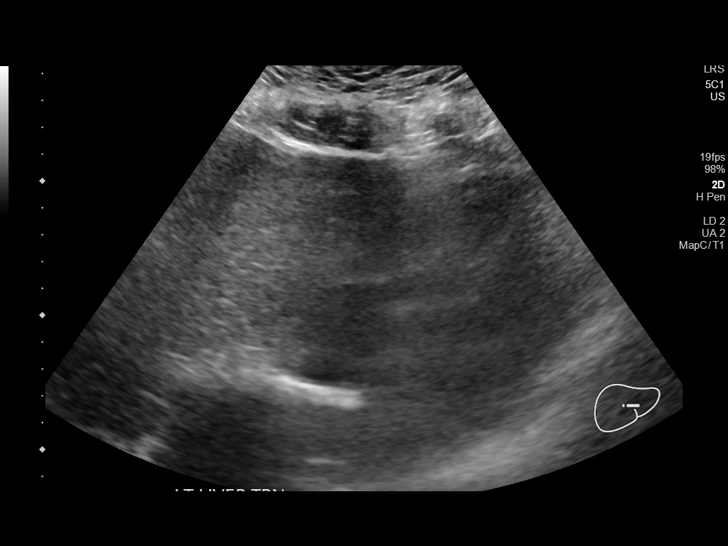
[im 22/40]
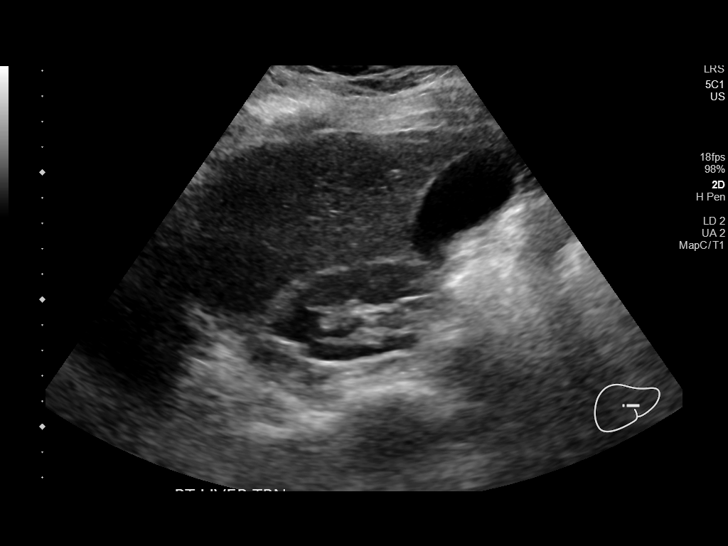
[im 25/40]
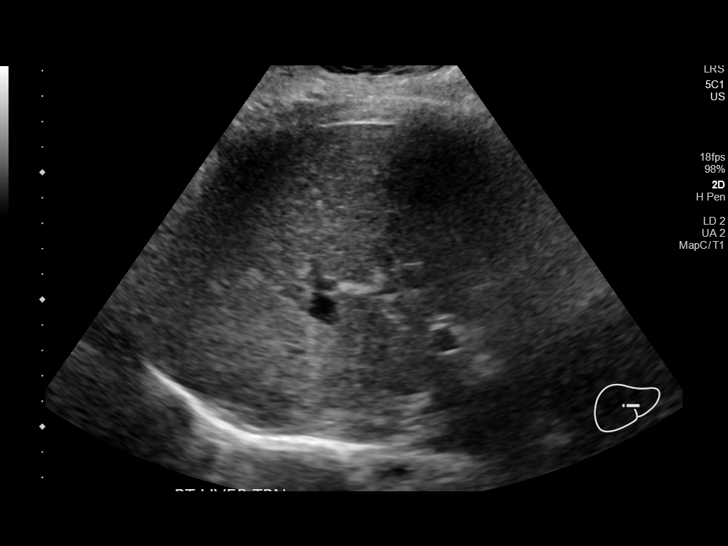
[im 27/40]
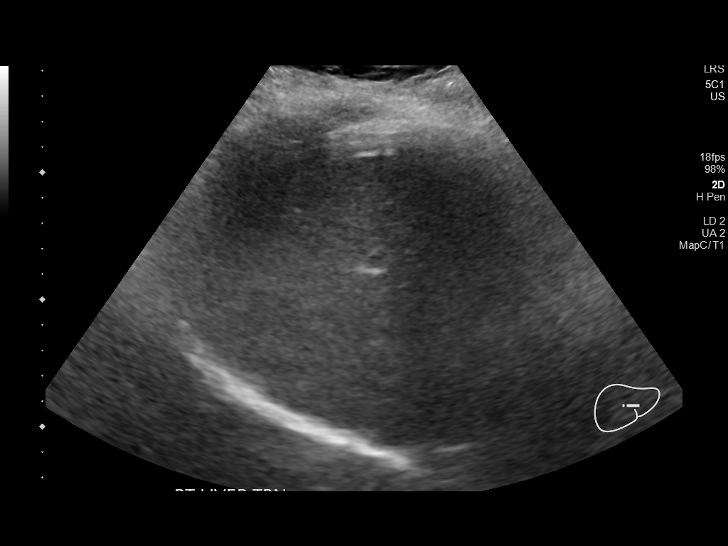
[im 30/40]
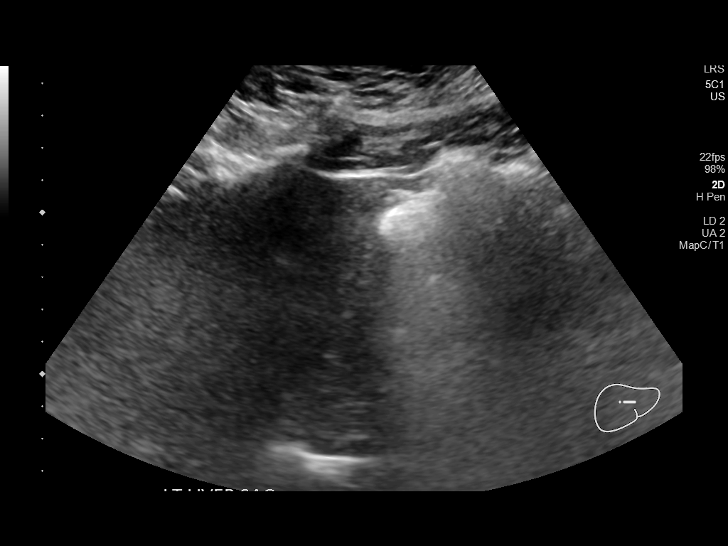
[im 33/40]
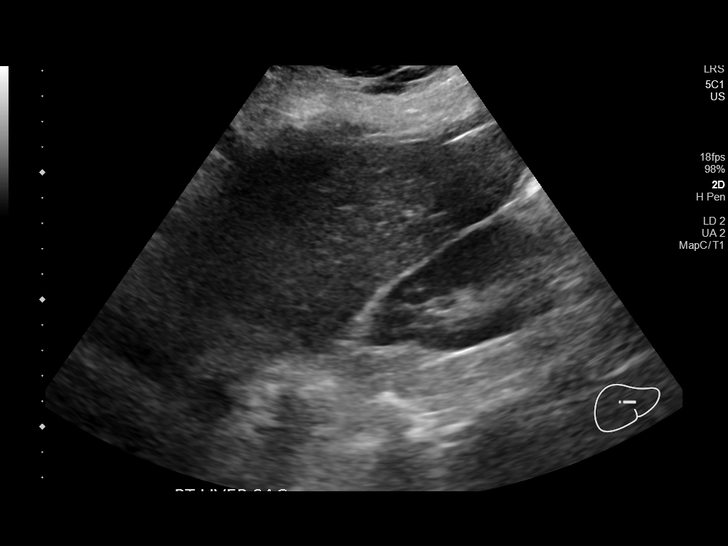
[im 36/40]
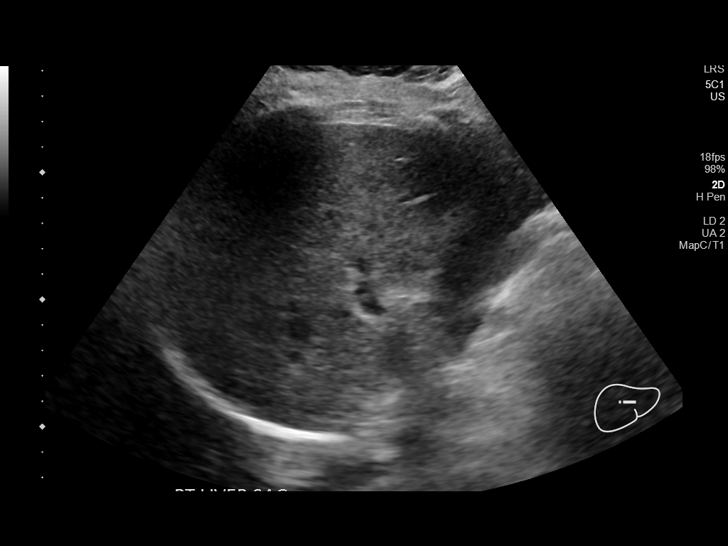
[im 40/40]
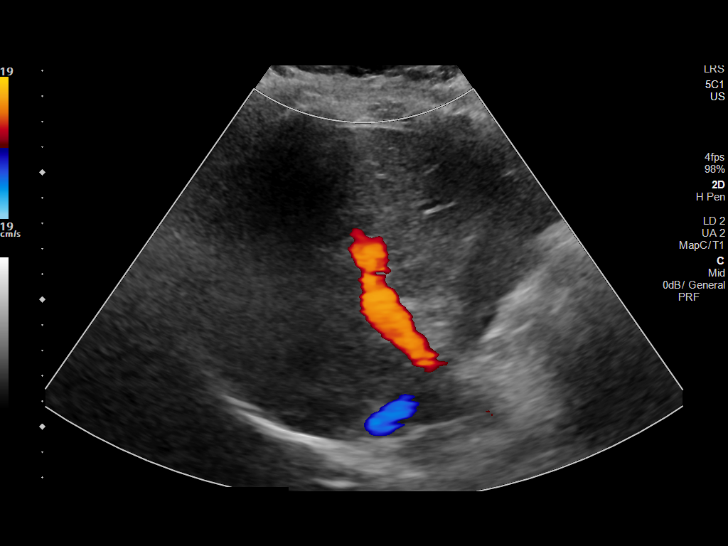

[14 of 25 positions shown; findings below may reference images not displayed]

FINDINGS: Gallbladder:

No gallstones or wall thickening visualized. No sonographic Murphy
sign noted by sonographer.

Common bile duct:

Diameter: 6 mm

Liver:

The liver is unremarkable. Portal vein is patent on color Doppler
imaging with normal direction of blood flow towards the liver.

Other: None.
IMPRESSION: Unremarkable right upper quadrant ultrasound.

## 2019-02-12 MED ORDER — ONDANSETRON 4 MG PO TBDP
4.0000 mg | ORAL_TABLET | Freq: Once | ORAL | Status: AC | PRN
  Administered 2019-02-12: 4 mg via ORAL
  Filled 2019-02-12: qty 1

## 2019-02-12 MED ORDER — MORPHINE SULFATE (PF) 4 MG/ML IV SOLN
4.0000 mg | Freq: Once | INTRAVENOUS | Status: AC
  Administered 2019-02-12: 23:00:00 4 mg via INTRAVENOUS
  Filled 2019-02-12: qty 1

## 2019-02-12 MED ORDER — SODIUM CHLORIDE 0.9 % IV BOLUS
1000.0000 mL | Freq: Once | INTRAVENOUS | Status: AC
  Administered 2019-02-12: 1000 mL via INTRAVENOUS

## 2019-02-12 MED ORDER — SODIUM CHLORIDE 0.9% FLUSH
3.0000 mL | Freq: Once | INTRAVENOUS | Status: AC
  Administered 2019-02-12: 3 mL via INTRAVENOUS

## 2019-02-12 MED ORDER — ONDANSETRON HCL 4 MG/2ML IJ SOLN
4.0000 mg | Freq: Once | INTRAMUSCULAR | Status: AC
  Administered 2019-02-12: 23:00:00 4 mg via INTRAVENOUS
  Filled 2019-02-12: qty 2

## 2019-02-12 NOTE — ED Triage Notes (Signed)
Pt presents with gallbladder pain along with nausea and vomiting that started around 4pm today.

## 2019-02-12 NOTE — ED Provider Notes (Signed)
[facility] DEPT Provider Note   CSN: [id number] Arrival date & time: 02/12/19  2014     History   Chief Complaint Chief Complaint  Patient presents with  . Abdominal Pain  . Emesis    HPI [name] [name] is a 33 y.o. female.     The history is provided by the patient and medical records. No language interpreter was used.  Abdominal Pain Associated symptoms: vomiting   Emesis Associated symptoms: abdominal pain      33 year old female with history of binge eating disorder, history of Roux-en-Y gastric bypass, obesity, anxiety, depression, presenting complaining of right upper quadrant abdominal pain.  Patient states since October of last year she has had recurrent pain to her right upper quadrant after eating.  Pain is sharp, shooting, sometimes radiates to her back, associate with nausea.  Pain is associated with eating fatty greasy food.  She has been seen and evaluated for her condition throughout this..  She has had a recent HIDA scan several days prior that shows a decreasing in ejection fraction possibly related to chronic cholecystitis.  She has been in communication with [facility] surgery (Dr. Redmond Pulling) but does not have a any scheduled plan for removal of her gallbladder.  She also has been managed by her GI specialist, Dr. Edison Nasuti and has had endoscopy and colonoscopy for this.  Currently she rates her pain is 9 out of 10, episodic and endorsed nausea.  She does not complain of any fever no dysuria no chest pain shortness of breath productive cough or any symptom concerning for COVID-19.  Past Medical History:  Diagnosis Date  . Anemia   . Anxiety   . DEPRESSION   . GERD (gastroesophageal reflux disease)   . Hypothyroidism    Hx of, normalized TSH during pregnancy 2011  . Infertility, female   . Migraines   . Morbid obesity ([place])    s/p RY 08/2012 - start weight 290 pounds  . Nephrolithiasis   . PCOS (polycystic ovarian syndrome)    . PONV (postoperative nausea and vomiting)   . Pregnancy induced hypertension     Patient Active Problem List   Diagnosis Date Noted  . Pain in right hand 12/02/2018  . Abnormal serum level of alkaline phosphatase 11/21/2018  . Pain in both lower extremities 11/21/2018  . Nocturnal leg cramps 10/26/2018  . Tachycardia 10/26/2018  . Bilateral lower extremity edema 10/26/2018  . Low serum ferritin level 10/26/2018  . Abdominal pain 08/17/2018  . Nephrolithiasis   . 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 Depressive disorder 07/09/2017  . Dysmenorrhea 07/09/2017  . Menorrhagia 07/09/2017  . Reduced libido, due to Lexapro 07/09/2017  . History of cesarean section 07/09/2017  . Binge eating disorder 04/23/2017  . Morbid obesity ([place]) 04/23/2017  . 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 Pes planus 07/16/2016  . Hallux varus, acquired, right 03/18/2016  . Iron deficiency anemia 02/22/2015  . 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 Anxiety 04/10/2010    Past Surgical History:  Procedure Laterality Date  . Barbie Banner OSTEOTOMY Left 06/24/2017   Procedure: [employer];  Surgeon: Trula Slade, DPM;  Location: [facility];  Service: Podiatry;  Laterality: Left;  . BUNIONECTOMY Left 06/24/2017   Procedure: Annye English;  Surgeon: Trula Slade, DPM;  Location: [facility];  Service: Podiatry;  Laterality: Left;  . CESAREAN SECTION     x 2  . FOOT SURGERY    .  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   Procedure: LAPAROSCOPIC ROUX-EN-Y GASTRIC;  Surgeon: Gayland Curry, MD;  Location: [facility];  Service: General;  Laterality: N/A;  laparoscopic roux-en-y gastric bypass  . LITHOTRIPSY Left   . TONSILLECTOMY    . TUBAL LIGATION    . UPPER GI ENDOSCOPY  08/24/2012   Procedure: UPPER GI ENDOSCOPY;  Surgeon: Gayland Curry, MD;  Location: [facility];  Service: General;;  . WISDOM TOOTH EXTRACTION       OB History    Gravida  2   Para  2    Term  2   Preterm  0   AB  0   Living  2     SAB  0   TAB  0   Ectopic  0   Multiple  0   Live Births  2            Home Medications    Prior to Admission medications   Medication Sig Start Date End Date Taking? Authorizing Provider  acetaminophen (TYLENOL) 500 MG tablet Take 1,000 mg by mouth every 6 (six) hours as needed for moderate pain.    Yes [provider]  ALPRAZolam Duanne Moron) 1 MG tablet Take 1 tablet (1 mg total) by mouth at bedtime. 02/09/19  Yes Briscoe Deutscher, DO  CALCIUM-MAGNESIUM-ZINC PO Take 3 tablets by mouth daily.   Yes [provider]  Cholecalciferol (VITAMIN D3) 125 MCG (5000 UT) TABS Take 5,000 Units by mouth daily.    Yes [provider]  diclofenac sodium (VOLTAREN) 1 % GEL Apply topically to affected area qid Patient taking differently: Apply 2 g topically 4 (four) times daily as needed (pain). Apply topically to affected area qid prn for pain 12/29/17  Yes Gerda Diss, DO  escitalopram (LEXAPRO) 20 MG tablet Take 1 tablet (20 mg total) by mouth daily. 04/20/18  Yes Briscoe Deutscher, DO  gabapentin (NEURONTIN) 100 MG capsule 132m in the morning and 200 mg at night Patient taking differently: Take 100-200 mg by mouth 2 (two) times daily. 1097min the morning and 200 mg at night 08/17/18  Yes WaBriscoe DeutscherDO  lamoTRIgine (LAMICTAL) 25 MG tablet Take one tab daily for 21 days then two daily after. Patient taking differently: Take 25 mg by mouth daily.  02/02/19  Yes WaBriscoe DeutscherDO  linaclotide (LINZESS) 72 MCG capsule Take 1 capsule (72 mcg total) by mouth daily. 07/06/18  Yes JaMilus BanisterMD  Menaquinone-7 (VITAMIN K2 PO) Take 1 capsule by mouth daily.    Yes [provider]  Multiple Vitamins-Iron (ONE-TABLET-DAILY/IRON PO) Take 1 tablet by mouth daily.   Yes [provider]  pantoprazole (PROTONIX) 40 MG tablet Take 1 tablet (40 mg total) by mouth daily. 01/25/19  Yes Esterwood, Amy S, PA-C   promethazine (PHENERGAN) 25 MG tablet Take 1 tablet (25 mg total) by mouth every 6 (six) hours as needed for nausea. 01/10/19  Yes PiDavonna BellingMD  traMADol (ULTRAM) 50 MG tablet Take 50 mg by mouth every 6 (six) hours as needed for severe pain.   Yes [provider]  Vitamin D, Ergocalciferol, (DRISDOL) 1.25 MG (50000 UT) CAPS capsule Take 50,000 Units by mouth every Monday.   Yes [provider]  Cholecalciferol 1.25 MG (50000 UT) TABS 50,000 units PO qwk for 12 weeks. Patient not taking: Reported on 02/12/2019 01/11/19   WaBriscoe DeutscherDO  dicyclomine (BENTYL) 20 MG tablet Take 1 tablet (20 mg total) by mouth  2 (two) times daily. Patient not taking: Reported on 02/12/2019 01/25/19   Esterwood, Amy S, PA-C  lamoTRIgine (LAMICTAL) 25 MG tablet Take 2 tablets (50 mg total) by mouth daily. Patient not taking: Reported on 02/12/2019 02/02/19   Briscoe Deutscher, DO  valACYclovir (VALTREX) 500 MG tablet 2 po in am and 2 po in pm x 1 day. Patient taking differently: Take 500 mg by mouth daily as needed (cold sores). 2 po in am and 2 po in pm x 1 day. 07/20/18   Briscoe Deutscher, DO    Family History Family History  Problem Relation Age of Onset  . Hyperlipidemia Father   . Hypertension Father   . Kidney disease Father   . Colon cancer Father 23  . Hypertension Mother   . Hypertension Maternal Grandmother   . Uterine cancer Maternal Grandmother 76  . Diabetes Maternal Grandfather     Social History Social History   Tobacco Use  . Smoking status: Never Smoker  . Smokeless tobacco: Never Used  Substance Use Topics  . Alcohol use: No    Comment: rare/socially  . Drug use: No     Allergies   Ambien [zolpidem] and Nsaids   Review of Systems Review of Systems  Gastrointestinal: Positive for abdominal pain and vomiting.  All other systems reviewed and are negative.    Physical Exam Updated Vital Signs BP (!) 175/115 (BP Location: Left Arm)   Pulse 91   Temp 98.2  F (36.8 C) (Oral)   Resp 18   Ht 5' 4"  (1.626 m)   Wt 115.2 kg   SpO2 100%   BMI 43.60 kg/m   Physical Exam Vitals signs and nursing note reviewed.  Constitutional:      General: She is not in acute distress.    Appearance: She is well-developed. She is obese.  HENT:     Head: Atraumatic.  Eyes:     Conjunctiva/sclera: Conjunctivae normal.  Neck:     Musculoskeletal: Neck supple.  Cardiovascular:     Rate and Rhythm: Normal rate and regular rhythm.     Heart sounds: Normal heart sounds.  Pulmonary:     Effort: Pulmonary effort is normal.     Breath sounds: Normal breath sounds.  Abdominal:     General: Abdomen is flat. Bowel sounds are normal.     Tenderness: There is abdominal tenderness in the right upper quadrant. There is no guarding or rebound.  Skin:    General: Skin is warm.     Findings: No rash.  Neurological:     General: No focal deficit present.     Mental Status: She is alert.  Psychiatric:        Mood and Affect: Mood normal.      ED Treatments / Results  Labs (all labs ordered are listed, but only abnormal results are displayed) Labs Reviewed  COMPREHENSIVE METABOLIC PANEL - Abnormal; Notable for the following components:      Result Value   AST 74 (*)    ALT 73 (*)    Alkaline Phosphatase 164 (*)    All other components within normal limits  URINALYSIS, ROUTINE W REFLEX MICROSCOPIC - Abnormal; Notable for the following components:   Leukocytes,Ua SMALL (*)    Bacteria, UA RARE (*)    All other components within normal limits  LIPASE, BLOOD  CBC  I-STAT BETA HCG BLOOD, ED ([facility], [facility], [facility] ONLY)    EKG None  Radiology US Abdomen Limited  Result Date:  02/12/2019 CLINICAL DATA:  32 year old female with right upper quadrant abdominal pain. EXAM: ULTRASOUND ABDOMEN LIMITED RIGHT UPPER QUADRANT COMPARISON:  CT of the abdomen pelvis dated 01/28/2019 FINDINGS: Gallbladder: No gallstones or wall thickening visualized. No sonographic Murphy sign  noted by sonographer. Common bile duct: Diameter: 6 mm Liver: The liver is unremarkable. Portal vein is patent on color Doppler imaging with normal direction of blood flow towards the liver. Other: None. IMPRESSION: Unremarkable right upper quadrant ultrasound. Electronically Signed   By: Anner Crete M.D.   On: 02/12/2019 23:51    Procedures Procedures (including critical care time)  Medications Ordered in ED Medications  morphine 4 MG/ML injection 4 mg (has no administration in time range)  sodium chloride 0.9 % bolus 1,000 mL (has no administration in time range)  ketorolac (TORADOL) 15 MG/ML injection 15 mg (has no administration in time range)  sodium chloride flush (NS) 0.9 % injection 3 mL (3 mLs Intravenous Given 02/12/19 2321)  ondansetron (ZOFRAN-ODT) disintegrating tablet 4 mg (4 mg Oral Given 02/12/19 2105)  morphine 4 MG/ML injection 4 mg (4 mg Intravenous Given 02/12/19 2318)  ondansetron (ZOFRAN) injection 4 mg (4 mg Intravenous Given 02/12/19 2318)  sodium chloride 0.9 % bolus 1,000 mL (1,000 mLs Intravenous New Bag/Given 02/12/19 2319)     Initial Impression / Assessment and Plan / ED Course  I have reviewed the triage vital signs and the nursing notes.  Pertinent labs & imaging results that were available during my care of the patient were reviewed by me and considered in my medical decision making (see chart for details).        BP 132/77   Pulse 81   Temp 98.2 F (36.8 C) (Oral)   Resp 18   Ht 5' 4"  (1.626 m)   Wt 115.2 kg   SpO2 100%   BMI 43.60 kg/m    Final Clinical Impressions(s) / ED Diagnoses   Final diagnoses:  Chronic RUQ pain    ED Discharge Orders         Ordered    acetaminophen (TYLENOL) 500 MG tablet  Every 6 hours PRN     02/13/19 0155    ondansetron (ZOFRAN) 4 MG tablet  Every 8 hours PRN     02/13/19 0155         11:01 PM Patient here with recurrent right upper quadrant abdominal pain, recent HIDA scan several days prior  shows a patent common bile duct however her ejection fraction is at 28% when 33% is considered normal.  This finding may suggest chronic cholecystitis.  Labs revealed normal WBC.  Evidence of transaminitis which includes AST 74 ALT 73 alk phos 164. patient states this value is similar to prior.  Plan to provide symptomatic treatment.  12:56 AM Right upper quadrant ultrasound without any acute finding.  Patient report minimal improvement with initial dose of pain medication.  She still endorse feeling dehydrated.  IV fluid provided and will provide additional pain medication.  I will also reach out to surgery to help set up outpatient follow-up.  1:18 AM Appreciate consultation from surgeon Dr. Dema Severin who will send a note to scheduler to help get pt in for a f/u appointment.    1:57 AM Patient report improvement of her pain.  At this time she is stable for discharge.  She will call and follow-up closely with surgeon for further care.  Return precautions discussed   Domenic Moras, PA-C 02/13/19 0157    Leonette Monarch Grayce Sessions, MD  02/14/19 1520

## 2019-02-13 ENCOUNTER — Encounter: Payer: Self-pay | Admitting: Family Medicine

## 2019-02-13 MED ORDER — ONDANSETRON HCL 4 MG PO TABS: 4.0000 mg | ORAL_TABLET | Freq: Three times a day (TID) | ORAL | 0 refills | Status: DC | PRN

## 2019-02-13 MED ORDER — ACETAMINOPHEN 500 MG PO TABS: 1000.0000 mg | ORAL_TABLET | Freq: Four times a day (QID) | ORAL | 0 refills | Status: DC | PRN

## 2019-02-13 MED ORDER — MORPHINE SULFATE (PF) 4 MG/ML IV SOLN
4.0000 mg | Freq: Once | INTRAVENOUS | Status: AC
  Administered 2019-02-13: 4 mg via INTRAVENOUS
  Filled 2019-02-13: qty 1

## 2019-02-13 MED ORDER — SODIUM CHLORIDE 0.9 % IV BOLUS
1000.0000 mL | Freq: Once | INTRAVENOUS | Status: AC
  Administered 2019-02-13: 1000 mL via INTRAVENOUS

## 2019-02-13 MED ORDER — KETOROLAC TROMETHAMINE 15 MG/ML IJ SOLN
15.0000 mg | Freq: Once | INTRAMUSCULAR | Status: AC
  Administered 2019-02-13: 15 mg via INTRAVENOUS
  Filled 2019-02-13: qty 1

## 2019-02-13 MED ORDER — TRAMADOL HCL 50 MG PO TABS: 50.0000 mg | ORAL_TABLET | Freq: Four times a day (QID) | ORAL | 0 refills | Status: DC | PRN

## 2019-02-13 NOTE — Discharge Instructions (Addendum)
Please call and follow-up closely with surgery for further management of your recurrent gallbladder pain

## 2019-02-14 ENCOUNTER — Observation Stay (HOSPITAL_COMMUNITY)
Admission: EM | Admit: 2019-02-14 | Discharge: 2019-02-16 | Disposition: A | Discharge status: Home / Self Care | Payer: No Typology Code available for payment source | Attending: Surgery | Admitting: Surgery

## 2019-02-14 ENCOUNTER — Telehealth: Payer: Self-pay | Admitting: Physician Assistant

## 2019-02-14 ENCOUNTER — Encounter (HOSPITAL_COMMUNITY): Payer: Self-pay

## 2019-02-14 ENCOUNTER — Ambulatory Visit: Payer: Self-pay

## 2019-02-14 ENCOUNTER — Other Ambulatory Visit: Payer: Self-pay

## 2019-02-14 DIAGNOSIS — N920 Excessive and frequent menstruation with regular cycle: Secondary | ICD-10-CM | POA: Insufficient documentation

## 2019-02-14 DIAGNOSIS — Z87442 Personal history of urinary calculi: Secondary | ICD-10-CM | POA: Diagnosis not present

## 2019-02-14 DIAGNOSIS — Z79899 Other long term (current) drug therapy: Secondary | ICD-10-CM | POA: Diagnosis not present

## 2019-02-14 DIAGNOSIS — Z888 Allergy status to other drugs, medicaments and biological substances status: Secondary | ICD-10-CM | POA: Diagnosis not present

## 2019-02-14 DIAGNOSIS — E282 Polycystic ovarian syndrome: Secondary | ICD-10-CM | POA: Diagnosis not present

## 2019-02-14 DIAGNOSIS — D509 Iron deficiency anemia, unspecified: Secondary | ICD-10-CM | POA: Diagnosis not present

## 2019-02-14 DIAGNOSIS — F329 Major depressive disorder, single episode, unspecified: Secondary | ICD-10-CM | POA: Insufficient documentation

## 2019-02-14 DIAGNOSIS — K811 Chronic cholecystitis: Principal | ICD-10-CM | POA: Insufficient documentation

## 2019-02-14 DIAGNOSIS — R1011 Right upper quadrant pain: Secondary | ICD-10-CM | POA: Diagnosis present

## 2019-02-14 DIAGNOSIS — F419 Anxiety disorder, unspecified: Secondary | ICD-10-CM | POA: Insufficient documentation

## 2019-02-14 DIAGNOSIS — E039 Hypothyroidism, unspecified: Secondary | ICD-10-CM | POA: Diagnosis not present

## 2019-02-14 DIAGNOSIS — N946 Dysmenorrhea, unspecified: Secondary | ICD-10-CM | POA: Diagnosis not present

## 2019-02-14 DIAGNOSIS — Z9884 Bariatric surgery status: Secondary | ICD-10-CM | POA: Diagnosis not present

## 2019-02-14 DIAGNOSIS — Z6841 Body Mass Index (BMI) 40.0 and over, adult: Secondary | ICD-10-CM | POA: Insufficient documentation

## 2019-02-14 DIAGNOSIS — I10 Essential (primary) hypertension: Secondary | ICD-10-CM | POA: Insufficient documentation

## 2019-02-14 DIAGNOSIS — K219 Gastro-esophageal reflux disease without esophagitis: Secondary | ICD-10-CM | POA: Insufficient documentation

## 2019-02-14 DIAGNOSIS — Z20828 Contact with and (suspected) exposure to other viral communicable diseases: Secondary | ICD-10-CM | POA: Diagnosis not present

## 2019-02-14 DIAGNOSIS — R51 Headache: Secondary | ICD-10-CM | POA: Diagnosis not present

## 2019-02-14 DIAGNOSIS — Z791 Long term (current) use of non-steroidal anti-inflammatories (NSAID): Secondary | ICD-10-CM | POA: Insufficient documentation

## 2019-02-14 DIAGNOSIS — Z419 Encounter for procedure for purposes other than remedying health state, unspecified: Secondary | ICD-10-CM

## 2019-02-14 DIAGNOSIS — K828 Other specified diseases of gallbladder: Secondary | ICD-10-CM | POA: Diagnosis present

## 2019-02-14 DIAGNOSIS — R748 Abnormal levels of other serum enzymes: Secondary | ICD-10-CM | POA: Diagnosis present

## 2019-02-14 DIAGNOSIS — Z886 Allergy status to analgesic agent status: Secondary | ICD-10-CM | POA: Diagnosis not present

## 2019-02-14 DIAGNOSIS — R000 Tachycardia, unspecified: Secondary | ICD-10-CM | POA: Insufficient documentation

## 2019-02-14 LAB — CBC
HCT: 40 % (ref 36.0–46.0)
Hemoglobin: 12.5 g/dL (ref 12.0–15.0)
MCH: 26.9 pg (ref 26.0–34.0)
MCHC: 31.3 g/dL (ref 30.0–36.0)
MCV: 86.2 fL (ref 80.0–100.0)
Platelets: 351 10*3/uL (ref 150–400)
RBC: 4.64 MIL/uL (ref 3.87–5.11)
RDW: 14.9 % (ref 11.5–15.5)
WBC: 7.1 10*3/uL (ref 4.0–10.5)
nRBC: 0 % (ref 0.0–0.2)

## 2019-02-14 LAB — COMPREHENSIVE METABOLIC PANEL
ALT: 57 U/L — ABNORMAL HIGH (ref 0–44)
AST: 49 U/L — ABNORMAL HIGH (ref 15–41)
Albumin: 3.8 g/dL (ref 3.5–5.0)
Alkaline Phosphatase: 151 U/L — ABNORMAL HIGH (ref 38–126)
Anion gap: 8 (ref 5–15)
BUN: 12 mg/dL (ref 6–20)
CO2: 27 mmol/L (ref 22–32)
Calcium: 9.1 mg/dL (ref 8.9–10.3)
Chloride: 106 mmol/L (ref 98–111)
Creatinine, Ser: 0.65 mg/dL (ref 0.44–1.00)
GFR calc Af Amer: >60 mL/min (ref >60)
GFR calc non Af Amer: >60 mL/min (ref >60)
Glucose, Bld: 88 mg/dL (ref 70–99)
Potassium: 4.5 mmol/L (ref 3.5–5.1)
Sodium: 141 mmol/L (ref 135–145)
Total Bilirubin: 0.4 mg/dL (ref 0.3–1.2)
Total Protein: 7.1 g/dL (ref 6.5–8.1)

## 2019-02-14 LAB — I-STAT BETA HCG BLOOD, ED (MC, WL, AP ONLY): I-stat hCG, quantitative: <5 m[IU]/mL (ref <5)

## 2019-02-14 LAB — LIPASE, BLOOD: Lipase: 27 U/L (ref 11–51)

## 2019-02-14 LAB — SARS CORONAVIRUS 2 BY RT PCR (HOSPITAL ORDER, PERFORMED IN ~~LOC~~ HOSPITAL LAB): SARS Coronavirus 2: NEGATIVE

## 2019-02-14 MED ORDER — HYDROCODONE-ACETAMINOPHEN 5-325 MG PO TABS: 1.0000 | ORAL_TABLET | ORAL | Status: DC | PRN

## 2019-02-14 MED ORDER — HYDROMORPHONE HCL 1 MG/ML IJ SOLN
1.0000 mg | Freq: Once | INTRAMUSCULAR | Status: AC
  Administered 2019-02-14: 20:00:00 1 mg via INTRAVENOUS
  Filled 2019-02-14: qty 1

## 2019-02-14 MED ORDER — HYDROMORPHONE HCL 1 MG/ML IJ SOLN
1.0000 mg | Freq: Once | INTRAMUSCULAR | Status: AC
  Administered 2019-02-14: 1 mg via INTRAVENOUS
  Filled 2019-02-14: qty 1

## 2019-02-14 MED ORDER — ONDANSETRON 4 MG PO TBDP: 4.0000 mg | ORAL_TABLET | Freq: Four times a day (QID) | ORAL | Status: DC | PRN

## 2019-02-14 MED ORDER — SODIUM CHLORIDE 0.9 % IV BOLUS
1000.0000 mL | Freq: Once | INTRAVENOUS | Status: AC
  Administered 2019-02-14: 20:00:00 1000 mL via INTRAVENOUS

## 2019-02-14 MED ORDER — ONDANSETRON HCL 4 MG/2ML IJ SOLN
4.0000 mg | Freq: Four times a day (QID) | INTRAMUSCULAR | Status: DC | PRN
  Administered 2019-02-15: 4 mg via INTRAVENOUS
  Filled 2019-02-14: qty 2

## 2019-02-14 MED ORDER — ONDANSETRON HCL 4 MG/2ML IJ SOLN
4.0000 mg | Freq: Once | INTRAMUSCULAR | Status: AC
  Administered 2019-02-14: 20:00:00 4 mg via INTRAVENOUS
  Filled 2019-02-14: qty 2

## 2019-02-14 MED ORDER — SODIUM CHLORIDE 0.9% FLUSH: 3.0000 mL | Freq: Once | INTRAVENOUS | Status: DC

## 2019-02-14 MED ORDER — KCL IN DEXTROSE-NACL 20-5-0.45 MEQ/L-%-% IV SOLN
INTRAVENOUS | Status: DC
  Administered 2019-02-14 – 2019-02-15 (×2): via INTRAVENOUS
  Filled 2019-02-14 (×2): qty 1000

## 2019-02-14 MED ORDER — ACETAMINOPHEN 325 MG PO TABS
650.0000 mg | ORAL_TABLET | Freq: Four times a day (QID) | ORAL | Status: DC | PRN
  Administered 2019-02-16: 13:00:00 650 mg via ORAL
  Filled 2019-02-14: qty 2

## 2019-02-14 MED ORDER — ACETAMINOPHEN 650 MG RE SUPP: 650.0000 mg | Freq: Four times a day (QID) | RECTAL | Status: DC | PRN

## 2019-02-14 MED ORDER — HYDROMORPHONE HCL 1 MG/ML IJ SOLN
1.0000 mg | INTRAMUSCULAR | Status: DC | PRN
  Administered 2019-02-14 – 2019-02-16 (×5): 1 mg via INTRAVENOUS
  Filled 2019-02-14 (×6): qty 1

## 2019-02-14 MED FILL — ONDANSETRON HCL 4 MG TABLET: 4 | 4 days supply | Qty: 12 | Fill #0

## 2019-02-14 NOTE — Telephone Encounter (Signed)
See note, [place] please advise on outcome

## 2019-02-14 NOTE — ED Notes (Signed)
Surgery at bedside.

## 2019-02-14 NOTE — ED Triage Notes (Signed)
Patient c/o abdominal pain x 2 days. Patient states she recently had a Hida Scan and her gallbladder was non functioning.

## 2019-02-14 NOTE — Telephone Encounter (Signed)
Dr.Wallace recommend her to go to the ED

## 2019-02-14 NOTE — Telephone Encounter (Signed)
Pt. Reports she is having upper right abdominal pain. States she knows she is having gallbladder issues since October. Was in the ED this past Saturday with pain. Pain is 8/10. Has nausea. Warm transfer to [facility] in the practice for a visit.  Answer Assessment - Initial Assessment Questions 1. LOCATION: "Where does it hurt?"      Right upper quadrant 2. RADIATION: "Does the pain shoot anywhere else?" (e.g., chest, back)     Back and shoulder 3. ONSET: "When did the pain begin?" (e.g., minutes, hours or days ago)      October 4. SUDDEN: "Gradual or sudden onset?"     Gradual 5. PATTERN "Does the pain come and go, or is it constant?"    - If constant: "Is it getting better, staying the same, or worsening?"      (Note: Constant means the pain never goes away completely; most serious pain is constant and it progresses)     - If intermittent: "How long does it last?" "Do you have pain now?"     (Note: Intermittent means the pain goes away completely between bouts)     Comes and goes 6. SEVERITY: "How bad is the pain?"  (e.g., Scale 1-10; mild, moderate, or severe)   - MILD (1-3): doesn't interfere with normal activities, abdomen soft and not tender to touch    - MODERATE (4-7): interferes with normal activities or awakens from sleep, tender to touch    - SEVERE (8-10): excruciating pain, doubled over, unable to do any normal activities      8 7. RECURRENT SYMPTOM: "Have you ever had this type of abdominal pain before?" If so, ask: "When was the last time?" and "What happened that time?"      Yes 8. CAUSE: "What do you think is causing the abdominal pain?"     Gallbladder 9. RELIEVING/AGGRAVATING FACTORS: "What makes it better or worse?" (e.g., movement, antacids, bowel movement)     No 10. OTHER SYMPTOMS: "Has there been any vomiting, diarrhea, constipation, or urine problems?"       Nausea 11. PREGNANCY: "Is there any chance you are pregnant?" "When was your last menstrual period?"  No  Protocols used: ABDOMINAL PAIN - [facility]

## 2019-02-14 NOTE — ED Notes (Signed)
No response from lobby when called for triage. (x1)

## 2019-02-14 NOTE — ED Provider Notes (Signed)
[facility] DEPT Provider Note   CSN: [id number] Arrival date & time: 02/14/19  1218     History   Chief Complaint Chief Complaint  Patient presents with  . Abdominal Pain    HPI [name] [name] is a 33 y.o. female.     HPI Patient states she had a HIDA scan 6 days ago which showed decrease in ejection fraction.  She is had persistent right upper quadrant pain since that time.  States the pain worsens after she eats small amounts of food.  Pain radiates to her right thoracic back.  Associated with nausea.  Has had decreased oral intake.  Denies fever or chills. Past Medical History:  Diagnosis Date  . Anemia   . Anxiety   . DEPRESSION   . GERD (gastroesophageal reflux disease)   . Hypothyroidism    Hx of, normalized TSH during pregnancy 2011  . Infertility, female   . Migraines   . Morbid obesity ([place])    s/p RY 08/2012 - start weight 290 pounds  . Nephrolithiasis   . PCOS (polycystic ovarian syndrome)   . PONV (postoperative nausea and vomiting)   . Pregnancy induced hypertension     Patient Active Problem List   Diagnosis Date Noted  . Pain in right hand 12/02/2018  . Abnormal serum level of alkaline phosphatase 11/21/2018  . Pain in both lower extremities 11/21/2018  . Nocturnal leg cramps 10/26/2018  . Tachycardia 10/26/2018  . Bilateral lower extremity edema 10/26/2018  . Low serum ferritin level 10/26/2018  . Abdominal pain 08/17/2018  . Nephrolithiasis   . Bleeding diathesis ([place]) 10/13/2017  . Depressive disorder 07/09/2017  . Dysmenorrhea 07/09/2017  . Menorrhagia 07/09/2017  . Reduced libido, due to Lexapro 07/09/2017  . History of cesarean section 07/09/2017  . Binge eating disorder 04/23/2017  . Morbid obesity ([place]) 04/23/2017  . 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 Pes planus 07/16/2016  . Hallux varus, acquired, right 03/18/2016  . Iron deficiency anemia 02/22/2015  . 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 Anxiety 04/10/2010    Past Surgical History:  Procedure Laterality Date  . Barbie Banner OSTEOTOMY Left 06/24/2017   Procedure: [employer];  Surgeon: Trula Slade, DPM;  Location: [facility];  Service: Podiatry;  Laterality: Left;  . BUNIONECTOMY Left 06/24/2017   Procedure: Annye English;  Surgeon: Trula Slade, DPM;  Location: [facility];  Service: Podiatry;  Laterality: Left;  . CESAREAN SECTION     x 2  . FOOT SURGERY    . 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   Procedure: LAPAROSCOPIC ROUX-EN-Y GASTRIC;  Surgeon: Gayland Curry, MD;  [facility];  Service: General;  Laterality: N/A;  laparoscopic roux-en-y gastric bypass  . LITHOTRIPSY Left   . TONSILLECTOMY    . TUBAL LIGATION    . UPPER GI ENDOSCOPY  08/24/2012   Procedure: UPPER GI ENDOSCOPY;  Surgeon: Gayland Curry, MD;  [facility];  Service: General;;  . WISDOM TOOTH EXTRACTION       OB History    Gravida  2   Para  2   Term  2   Preterm  0   AB  0   Living  2     SAB  0   TAB  0   Ectopic  0   Multiple  0   Live Births  2  Home Medications    Prior to Admission medications   Medication Sig Start Date End Date Taking? Authorizing Provider  acetaminophen (TYLENOL) 500 MG tablet Take 2 tablets (1,000 mg total) by mouth every 6 (six) hours as needed for moderate pain. 02/13/19  Yes Fayrene Helper, PA-C  ALPRAZolam Prudy Feeler) 1 MG tablet Take 1 tablet (1 mg total) by mouth at bedtime. 02/09/19  Yes Helane Rima, DO  CALCIUM-MAGNESIUM-ZINC PO Take 3 tablets by mouth daily.   Yes [provider]  diclofenac sodium (VOLTAREN) 1 % GEL Apply topically to affected area qid Patient taking differently: Apply 2 g topically 4 (four) times daily as needed (pain). Apply topically to affected area qid prn for pain 12/29/17  Yes [name], DO  escitalopram (LEXAPRO) 20 MG tablet Take 1 tablet (20 mg total) by mouth daily. 04/20/18   Yes Helane Rima, DO  gabapentin (NEURONTIN) 100 MG capsule 100mg  in the morning and 200 mg at night Patient taking differently: Take 100-200 mg by mouth 2 (two) times daily. 100mg  in the morning and 200 mg at night 08/17/18  Yes Helane Rima, DO  lamoTRIgine (LAMICTAL) 25 MG tablet Take one tab daily for 21 days then two daily after. Patient taking differently: Take 25 mg by mouth daily.  02/02/19  Yes Helane Rima, DO  linaclotide (LINZESS) 72 MCG capsule Take 1 capsule (72 mcg total) by mouth daily. 07/06/18  Yes Rachael Fee, MD  Menaquinone-7 (VITAMIN K2 PO) Take 1 capsule by mouth daily.    Yes [provider]  Multiple Vitamins-Iron (ONE-TABLET-DAILY/IRON PO) Take 1 tablet by mouth daily.   Yes [provider]  ondansetron (ZOFRAN) 4 MG tablet Take 1 tablet (4 mg total) by mouth every 8 (eight) hours as needed for nausea or vomiting. 02/13/19  Yes Fayrene Helper, PA-C  pantoprazole (PROTONIX) 40 MG tablet Take 1 tablet (40 mg total) by mouth daily. 01/25/19  Yes Esterwood, Amy S, PA-C  promethazine (PHENERGAN) 25 MG tablet Take 1 tablet (25 mg total) by mouth every 6 (six) hours as needed for nausea. 01/10/19  Yes Benjiman Core, MD  traMADol (ULTRAM) 50 MG tablet Take 1 tablet (50 mg total) by mouth every 6 (six) hours as needed for severe pain. 02/13/19  Yes Fayrene Helper, PA-C  valACYclovir (VALTREX) 500 MG tablet 2 po in am and 2 po in pm x 1 day. Patient taking differently: Take 500 mg by mouth daily as needed (cold sores). 2 po in am and 2 po in pm x 1 day. 07/20/18  Yes Helane Rima, DO  Vitamin D, Ergocalciferol, (DRISDOL) 1.25 MG (50000 UT) CAPS capsule Take 50,000 Units by mouth every Monday.   Yes [provider]  dicyclomine (BENTYL) 20 MG tablet Take 1 tablet (20 mg total) by mouth 2 (two) times daily. Patient not taking: Reported on 02/12/2019 01/25/19 02/13/19  Sammuel Cooper, PA-C    Family History Family History  Problem Relation Age of Onset  .  Hyperlipidemia Father   . Hypertension Father   . Kidney disease Father   . Colon cancer Father 65  . Hypertension Mother   . Hypertension Maternal Grandmother   . Uterine cancer Maternal Grandmother 41  . Diabetes Maternal Grandfather     Social History Social History   Tobacco Use  . Smoking status: Never Smoker  . Smokeless tobacco: Never Used  Substance Use Topics  . Alcohol use: No    Comment: rare/socially  . Drug use: No  Allergies   Ambien [zolpidem] and Nsaids   Review of Systems Review of Systems  Constitutional: Positive for appetite change. Negative for chills and fever.  HENT: Negative for sore throat and trouble swallowing.   Eyes: Negative for visual disturbance.  Respiratory: Negative for cough and shortness of breath.   Cardiovascular: Negative for chest pain.  Gastrointestinal: Positive for abdominal pain, nausea and vomiting. Negative for abdominal distention, constipation and diarrhea.  Genitourinary: Negative for dysuria, flank pain, frequency and hematuria.  Musculoskeletal: Positive for back pain. Negative for neck pain and neck stiffness.  Skin: Negative for rash and wound.  Neurological: Negative for dizziness, weakness, light-headedness, numbness and headaches.  All other systems reviewed and are negative.    Physical Exam Updated Vital Signs BP 133/88   Pulse 75   Temp 99.1 F (37.3 C) (Oral)   Resp 18   Ht 5\' 5"  (1.651 m)   Wt 119.4 kg   SpO2 100%   BMI 43.80 kg/m   Physical Exam Vitals signs and nursing note reviewed.  Constitutional:      General: She is not in acute distress.    Appearance: Normal appearance. She is well-developed.  HENT:     Head: Normocephalic and atraumatic.     Nose: Nose normal.     Mouth/Throat:     Mouth: Mucous membranes are moist.  Eyes:     Pupils: Pupils are equal, round, and reactive to light.  Neck:     Musculoskeletal: Normal range of motion and neck supple. No neck rigidity or  muscular tenderness.  Cardiovascular:     Rate and Rhythm: Normal rate and regular rhythm.     Heart sounds: No murmur. No friction rub. No gallop.   Pulmonary:     Effort: Pulmonary effort is normal. No respiratory distress.     Breath sounds: Normal breath sounds. No stridor. No wheezing, rhonchi or rales.  Chest:     Chest wall: No tenderness.  Abdominal:     General: Bowel sounds are normal.     Palpations: Abdomen is soft.     Tenderness: There is abdominal tenderness. There is no guarding or rebound.     Comments: Right upper quadrant tenderness to palpation.  No rebound or guarding.  Musculoskeletal: Normal range of motion.        General: No swelling, tenderness, deformity or signs of injury.     Right lower leg: No edema.     Left lower leg: No edema.     Comments: No CVA tenderness bilaterally.  No lower extremity swelling, asymmetry or tenderness.  Lymphadenopathy:     Cervical: No cervical adenopathy.  Skin:    General: Skin is warm and dry.     Findings: No erythema or rash.  Neurological:     General: No focal deficit present.     Mental Status: She is alert and oriented to person, place, and time.  Psychiatric:        Behavior: Behavior normal.      ED Treatments / Results  Labs (all labs ordered are listed, but only abnormal results are displayed) Labs Reviewed  COMPREHENSIVE METABOLIC PANEL - Abnormal; Notable for the following components:      Result Value   AST 49 (*)    ALT 57 (*)    Alkaline Phosphatase 151 (*)    All other components within normal limits  SARS CORONAVIRUS 2 (HOSPITAL ORDER, PERFORMED IN [facility])  LIPASE, BLOOD  CBC  URINALYSIS, ROUTINE W REFLEX MICROSCOPIC  I-STAT BETA HCG BLOOD, ED ([facility], [facility], [facility] ONLY)    EKG None  Radiology [place] Abdomen Limited  Result Date: 02/12/2019 CLINICAL DATA:  33 year old female with right upper quadrant abdominal pain. EXAM: ULTRASOUND ABDOMEN LIMITED RIGHT UPPER QUADRANT COMPARISON:   CT of the abdomen pelvis dated 01/28/2019 FINDINGS: Gallbladder: No gallstones or wall thickening visualized. No sonographic Murphy sign noted by sonographer. Common bile duct: Diameter: 6 mm Liver: The liver is unremarkable. Portal vein is patent on color Doppler imaging with normal direction of blood flow towards the liver. Other: None. IMPRESSION: Unremarkable right upper quadrant ultrasound. Electronically Signed   By: Elgie CollardArash  Radparvar M.D.   On: 02/12/2019 23:51    Procedures Procedures (including critical care time)  Medications Ordered in ED Medications  sodium chloride flush (NS) 0.9 % injection 3 mL (0 mLs Intravenous Hold 02/14/19 1823)  HYDROmorphone (DILAUDID) injection 1 mg (1 mg Intravenous Given 02/14/19 1956)  sodium chloride 0.9 % bolus 1,000 mL (1,000 mLs Intravenous New Bag/Given 02/14/19 2013)  ondansetron (ZOFRAN) injection 4 mg (4 mg Intravenous Given 02/14/19 1957)     Initial Impression / Assessment and Plan / ED Course  I have reviewed the triage vital signs and the nursing notes.  Pertinent labs & imaging results that were available during my care of the patient were reviewed by me and considered in my medical decision making (see chart for details).        Discussed with Dr. Gerrit FriendsGerkin who will see patient in the emergency department and admit.  Final Clinical Impressions(s) / ED Diagnoses   Final diagnoses:  Chronic cholecystitis    ED Discharge Orders    None       Loren RacerYelverton, Asharia Lotter, MD 02/14/19 2033

## 2019-02-14 NOTE — Telephone Encounter (Signed)
Pt would like to discuss results of HIDA.

## 2019-02-14 NOTE — Telephone Encounter (Signed)
Patient called office and was told to go to ED, as there was no openings for today with Dr. Juleen China.

## 2019-02-14 NOTE — Telephone Encounter (Signed)
See imaging results. Discussed. Referred to [facility]

## 2019-02-14 NOTE — H&P (Signed)
[name] [name] is an 33 y.o. female.    General Surgery [facility]  Chief Complaint: abdominal pain, nausea  HPI: patient is a 33 yo WF with 8 month hx of intermittent RUQ abd pain.  Extensive evaluation by multiple providers including USN, EGD, colonoscopy, HIDA scan, and labs.  Now with persistent RUQ abd pain and nausea.  Recent HIDA scan with slightly low ejection fraction at 28%.  Labs with mild elevation of transaminases and alkaline phosphatase.  Patient referred to surgery for evaluation but told no appointments available until September, therefore referred to ER for evaluation and management.  General surgery call for evaluation and admission.  Past hx of gastric bypass procedure by Dr. Gaynelle Adu.  Husband at bedside.  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  Past Medical History:  Diagnosis Date  . Anemia   . Anxiety   . DEPRESSION   . GERD (gastroesophageal reflux disease)   . Hypothyroidism    Hx of, normalized TSH during pregnancy 2011  . Infertility, female   . Migraines   . Morbid obesity ([facility])    s/p RY 08/2012 - start weight 290 pounds  . Nephrolithiasis   . PCOS (polycystic ovarian syndrome)   . PONV (postoperative nausea and vomiting)   . Pregnancy induced hypertension     Past Surgical History:  Procedure Laterality Date  . Quintella Reichert OSTEOTOMY Left 06/24/2017   Procedure: Ralene Bathe;  Surgeon: [name], DPM;  Location: [facility];  Service: Podiatry;  Laterality: Left;  . BUNIONECTOMY Left 06/24/2017   Procedure: Anthoney Harada;  Surgeon: [name], DPM;  Location: [facility];  Service: Podiatry;  Laterality: Left;  . CESAREAN SECTION     x 2  . FOOT SURGERY    . 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   Procedure: LAPAROSCOPIC ROUX-EN-Y GASTRIC;  Surgeon: Atilano Ina, MD;  [facility];  Service: General;  Laterality: N/A;  laparoscopic roux-en-y gastric bypass  .  LITHOTRIPSY Left   . TONSILLECTOMY    . TUBAL LIGATION    . UPPER GI ENDOSCOPY  08/24/2012   Procedure: UPPER GI ENDOSCOPY;  Surgeon: Atilano Ina, MD;  [facility];  Service: General;;  . WISDOM TOOTH EXTRACTION      Family History  Problem Relation Age of Onset  . Hyperlipidemia Father   . Hypertension Father   . Kidney disease Father   . Colon cancer Father 35  . Hypertension Mother   . Hypertension Maternal Grandmother   . Uterine cancer Maternal Grandmother 58  . Diabetes Maternal Grandfather    Social History:  reports that she has never smoked. She has never used smokeless tobacco. She reports that she does not drink alcohol or use drugs.  Allergies:  Allergies  Allergen Reactions  . Ambien [Zolpidem] Other (See Comments)    Sleep walking   . Nsaids     Gastric bypass    (Not in a hospital admission)   Results for orders placed or performed during the hospital encounter of 02/14/19 (from the past 48 hour(s))  Lipase, blood     Status: None   Collection Time: 02/14/19  2:51 PM  Result Value Ref Range   Lipase 27 11 - 51 U/L    Comment: Performed at [facility], [address], [place], [place] [address]  Comprehensive metabolic panel     Status: Abnormal   Collection Time: 02/14/19  2:51 PM  Result Value Ref Range   Sodium 141 135 - 145 mmol/L   Potassium 4.5 3.5 - 5.1 mmol/L   Chloride 106 98 - 111 mmol/L   CO2 27 22 - 32 mmol/L   Glucose, Bld 88 70 - 99 mg/dL   BUN 12 6 - 20 mg/dL   Creatinine, Ser 1.610.65 0.44 - 1.00 mg/dL   Calcium 9.1 8.9 - 09.610.3 mg/dL   Total Protein 7.1 6.5 - 8.1 g/dL   Albumin 3.8 3.5 - 5.0 g/dL   AST 49 (H) 15 - 41 U/L   ALT 57 (H) 0 - 44 U/L   Alkaline Phosphatase 151 (H) 38 - 126 U/L   Total Bilirubin 0.4 0.3 - 1.2 mg/dL   GFR calc non Af Amer >60 >60 mL/min   GFR calc Af Amer >60 >60 mL/min   Anion gap 8 5 - 15    Comment: Performed at [facility], [address] [place],  [place] [id number]  CBC     Status: None   Collection Time: 02/14/19  2:51 PM  Result Value Ref Range   WBC 7.1 4.0 - 10.5 K/uL   RBC 4.64 3.87 - 5.11 MIL/uL   Hemoglobin 12.5 12.0 - 15.0 g/dL   HCT 98.140.0 19.136.0 - 47.846.0 %   MCV 86.2 80.0 - 100.0 fL   MCH 26.9 26.0 - 34.0 pg   MCHC 31.3 30.0 - 36.0 g/dL   RDW 29.514.9 62.111.5 - 30.815.5 %   Platelets 351 150 - 400 K/uL   nRBC 0.0 0.0 - 0.2 %    Comment: Performed at [facility], [address] [place], [place] [address]  I-Stat Beta hCG blood, ED ([facility], [facility], [facility] only)     Status: None   Collection Time: 02/14/19  8:07 PM  Result Value Ref Range   I-stat hCG, quantitative <5.0 <5 mIU/mL   Comment 3            Comment:   GEST. AGE      CONC.  (mIU/mL)   <=1 WEEK        5 - 50     2 WEEKS       50 - 500     3 WEEKS       100 - 10,000     4 WEEKS     1,000 - 30,000        FEMALE AND NON-PREGNANT FEMALE:     LESS THAN 5 mIU/mL    [place] Abdomen Limited  Result Date: 02/12/2019 CLINICAL DATA:  33 year old female with right upper quadrant abdominal pain. EXAM: ULTRASOUND ABDOMEN LIMITED RIGHT UPPER QUADRANT COMPARISON:  CT of the abdomen pelvis dated 01/28/2019 FINDINGS: Gallbladder: No gallstones or wall thickening visualized. No sonographic Murphy sign noted by sonographer. Common bile duct: Diameter: 6 mm Liver: The liver is unremarkable. Portal vein is patent on color Doppler imaging with normal direction of blood flow towards the liver. Other: None. IMPRESSION: Unremarkable right upper quadrant ultrasound. Electronically Signed   By: Elgie CollardArash  Radparvar M.D.   On: 02/12/2019 23:51    Review of Systems  Constitutional: Negative for chills, diaphoresis, fever and weight loss.  HENT: Negative.   Eyes: Negative.   Respiratory: Negative.   Cardiovascular: Negative.   Gastrointestinal: Positive for abdominal pain, nausea and vomiting.  Genitourinary: Negative.   Musculoskeletal: Positive for back pain.  Skin: Negative.   Neurological: Negative.    Endo/Heme/Allergies: Negative.   Psychiatric/Behavioral: Negative.  Blood pressure 133/88, pulse 75, temperature 99.1 F (37.3 C), temperature source Oral, resp. rate 18, height 5\' 5"  (1.651 m), weight 119.4 kg, SpO2 100 %. Physical Exam  Constitutional: She is oriented to person, place, and time. She appears well-developed and well-nourished. No distress.  HENT:  Head: Normocephalic and atraumatic.  Right Ear: External ear normal.  Left Ear: External ear normal.  Eyes: Pupils are equal, round, and reactive to light. Conjunctivae are normal. No scleral icterus.  Neck: Normal range of motion. Neck supple. No tracheal deviation present. No thyromegaly present.  Cardiovascular: Normal rate, regular rhythm and normal heart sounds.  No murmur heard. Respiratory: Effort normal and breath sounds normal. No respiratory distress. She has no wheezes.  GI: Soft. Bowel sounds are normal. She exhibits no distension and no mass. There is abdominal tenderness (mild RUQ). There is no rebound and no guarding.  Well healed laparoscopic incisions  Musculoskeletal: Normal range of motion.        General: No deformity or edema.  Lymphadenopathy:    She has no cervical adenopathy.  Neurological: She is alert and oriented to person, place, and time.  Skin: Skin is warm and dry. She is not diaphoretic.  Psychiatric: She has a normal mood and affect. Her behavior is normal.     Assessment/Plan Abdominal pain, nausea Biliary dyskinesia Elevated liver enzymes  Admit to surgical service  Clear liquid diet until MN, then NPO  Dr. Annye English and Dr. Greer Pickerel to evaluate in AM  Likely for lap chole with IOC during this admission  Armandina Gemma, MD [facility] Office: [phone]    Armandina Gemma, MD 02/14/2019, 9:09 PM

## 2019-02-14 NOTE — ED Notes (Signed)
Went to give patient in room 1 PRN dilaudid and the prefilled syringe broke and the medication spilled on the floor. Talked to pharmacy tech, Crystal, and reported spilled medication. Pharmacy tech, Crystal, helped me get another dilaudid from pyxis.

## 2019-02-14 NOTE — Telephone Encounter (Signed)
Patient in ED now. 

## 2019-02-15 ENCOUNTER — Observation Stay (HOSPITAL_COMMUNITY): Payer: No Typology Code available for payment source | Admitting: Certified Registered Nurse Anesthetist

## 2019-02-15 ENCOUNTER — Telehealth: Payer: Self-pay | Admitting: Family Medicine

## 2019-02-15 ENCOUNTER — Encounter: Payer: Self-pay | Admitting: Family Medicine

## 2019-02-15 ENCOUNTER — Observation Stay (HOSPITAL_COMMUNITY): Payer: No Typology Code available for payment source

## 2019-02-15 ENCOUNTER — Encounter (HOSPITAL_COMMUNITY): Payer: Self-pay | Admitting: *Deleted

## 2019-02-15 ENCOUNTER — Encounter (HOSPITAL_COMMUNITY)
Admission: EM | Disposition: A | Discharge status: Home / Self Care | Payer: Self-pay | Source: Home / Self Care | Attending: Emergency Medicine

## 2019-02-15 HISTORY — PX: CHOLECYSTECTOMY: SHX55

## 2019-02-15 LAB — URINALYSIS, ROUTINE W REFLEX MICROSCOPIC
Bilirubin Urine: NEGATIVE
Glucose, UA: NEGATIVE mg/dL
Hgb urine dipstick: NEGATIVE
Ketones, ur: NEGATIVE mg/dL
Leukocytes,Ua: NEGATIVE
Nitrite: NEGATIVE
Protein, ur: NEGATIVE mg/dL
Specific Gravity, Urine: 1.015 (ref 1.005–1.030)
pH: 6 (ref 5.0–8.0)

## 2019-02-15 LAB — HIV ANTIBODY (ROUTINE TESTING W REFLEX): HIV Screen 4th Generation wRfx: NONREACTIVE

## 2019-02-15 IMAGING — RF DG CHOLANGIOGRAM OPERATIVE
1 series · 4 of 4 positions shown · non-contrast
Comparison: Ultrasound [DATE]

CLINICAL DATA: Cholecystectomy.

EXAM:
INTRAOPERATIVE CHOLANGIOGRAM
TECHNIQUE: Cholangiographic images from the C-arm fluoroscopic device were
submitted for interpretation post-operatively. Please see the
procedural report for the amount of contrast and the fluoroscopy
time utilized.

[Series 1: run · 4 of 81 frames shown]
[frame 13/81]
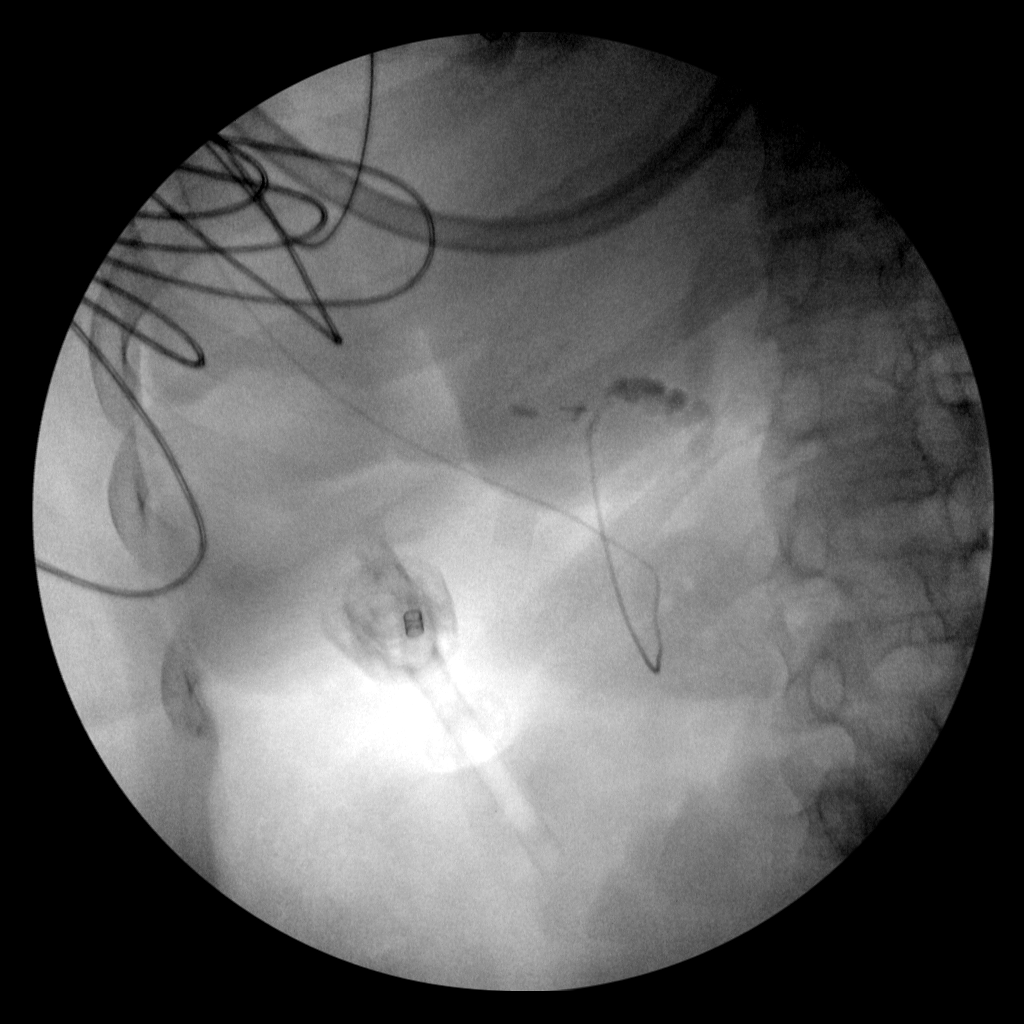
[frame 41/81]
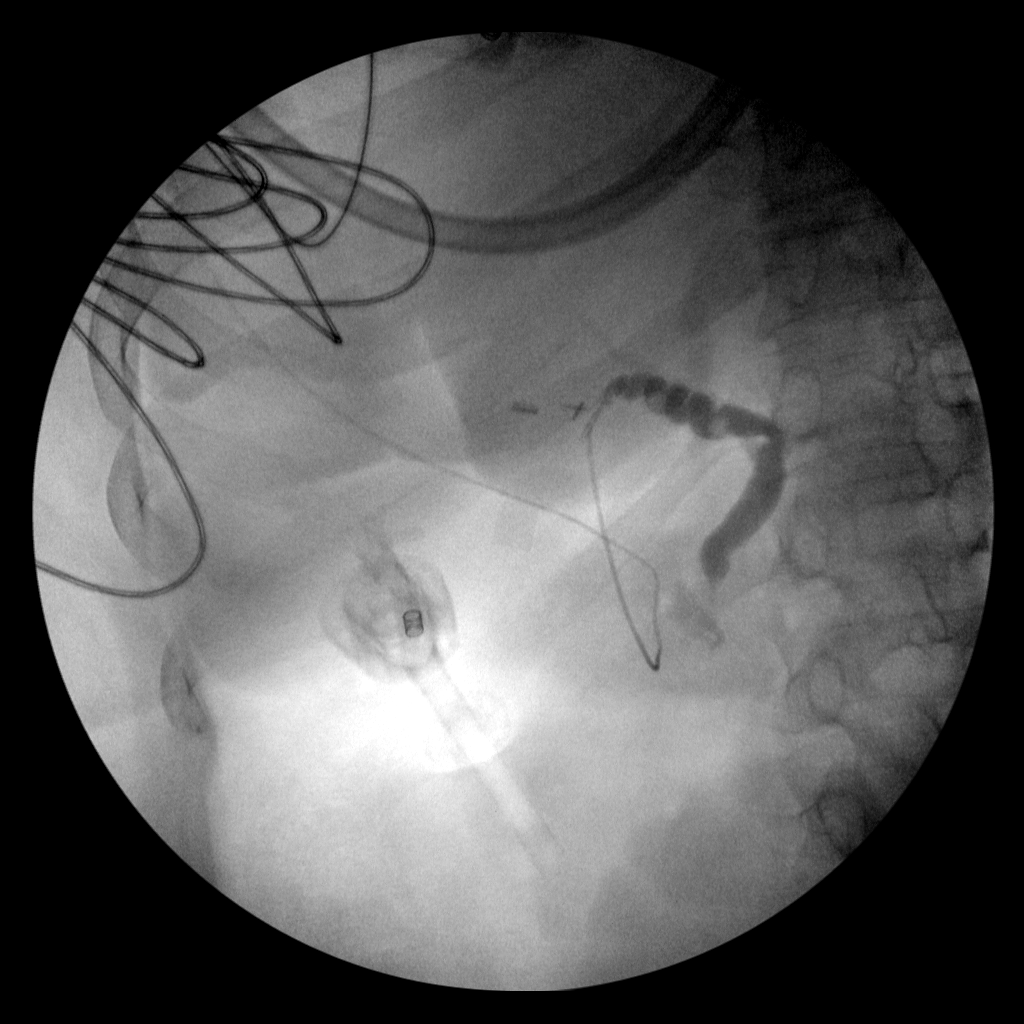
[frame 69/81]
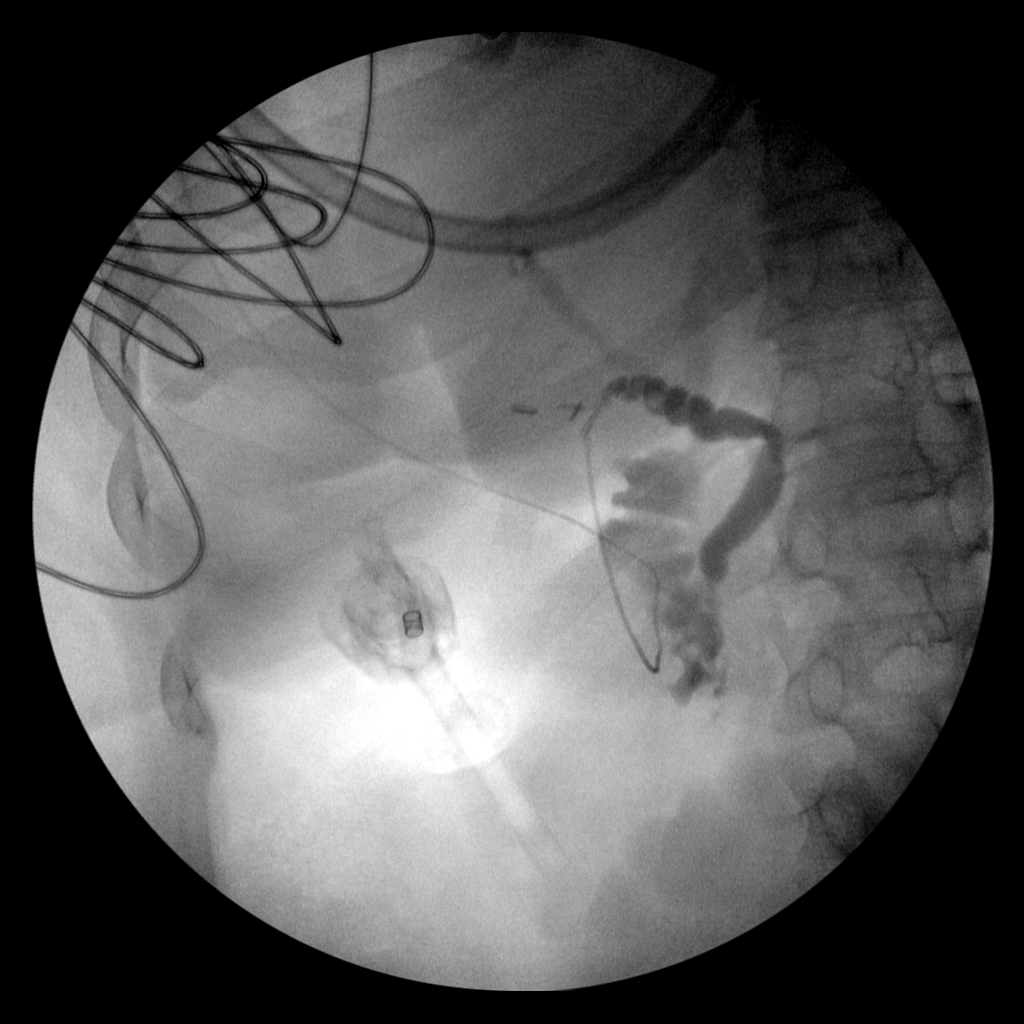
[frame 81/81]
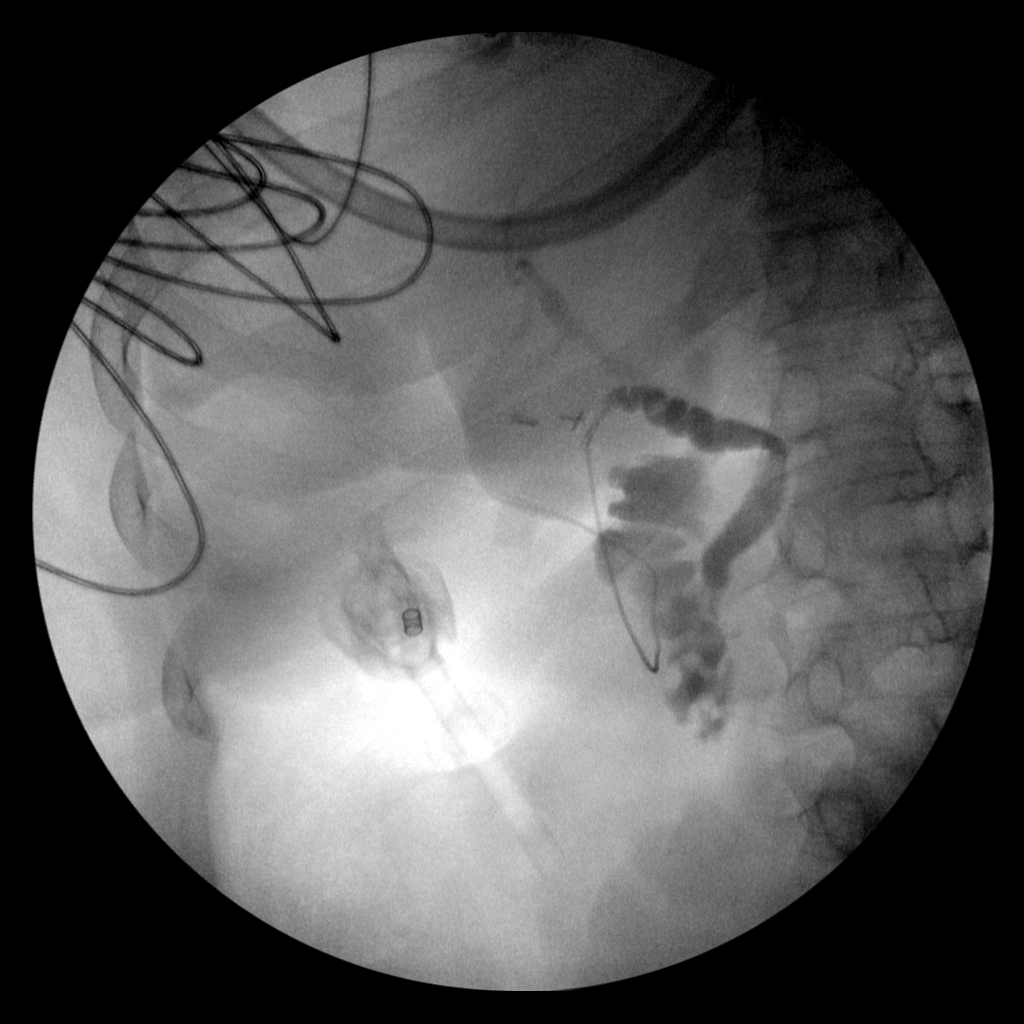

[4 of 4 positions shown; findings below may reference images not displayed]

FINDINGS: Cystic duct is patent. Contrast drains through the common bile duct
into the duodenum. Round filling defects in cystic duct are
suggestive for gas bubbles. Minor reflux into the common hepatic
duct and intrahepatic biliary system.
IMPRESSION: Patent biliary system.  No obstructing stones.

## 2019-02-15 SURGERY — LAPAROSCOPIC CHOLECYSTECTOMY WITH INTRAOPERATIVE CHOLANGIOGRAM
Anesthesia: General | Site: Abdomen

## 2019-02-15 MED ORDER — MEPERIDINE HCL 50 MG/ML IJ SOLN: 6.2500 mg | INTRAMUSCULAR | Status: DC | PRN

## 2019-02-15 MED ORDER — PROPOFOL 10 MG/ML IV BOLUS
INTRAVENOUS | Status: AC
  Filled 2019-02-15: qty 20

## 2019-02-15 MED ORDER — LACTATED RINGERS IV SOLN
INTRAVENOUS | Status: AC | PRN
  Administered 2019-02-15: 1000 mL

## 2019-02-15 MED ORDER — DEXAMETHASONE SODIUM PHOSPHATE 10 MG/ML IJ SOLN
INTRAMUSCULAR | Status: AC
  Filled 2019-02-15: qty 1

## 2019-02-15 MED ORDER — ONDANSETRON HCL 4 MG/2ML IJ SOLN
INTRAMUSCULAR | Status: DC | PRN
  Administered 2019-02-15 (×2): 4 mg via INTRAVENOUS

## 2019-02-15 MED ORDER — MIDAZOLAM HCL 2 MG/2ML IJ SOLN
INTRAMUSCULAR | Status: AC
  Filled 2019-02-15: qty 2

## 2019-02-15 MED ORDER — TRAMADOL HCL 50 MG PO TABS: 50.0000 mg | ORAL_TABLET | Freq: Four times a day (QID) | ORAL | Status: DC | PRN

## 2019-02-15 MED ORDER — ONDANSETRON HCL 4 MG/2ML IJ SOLN
INTRAMUSCULAR | Status: AC
  Filled 2019-02-15: qty 2

## 2019-02-15 MED ORDER — HYDROMORPHONE HCL 1 MG/ML IJ SOLN
INTRAMUSCULAR | Status: AC
  Administered 2019-02-15: 0.5 mg via INTRAVENOUS
  Filled 2019-02-15: qty 1

## 2019-02-15 MED ORDER — MIDAZOLAM HCL 5 MG/5ML IJ SOLN
INTRAMUSCULAR | Status: DC | PRN
  Administered 2019-02-15: 2 mg via INTRAVENOUS

## 2019-02-15 MED ORDER — DOCUSATE SODIUM 100 MG PO CAPS
100.0000 mg | ORAL_CAPSULE | Freq: Two times a day (BID) | ORAL | Status: DC
  Administered 2019-02-15 – 2019-02-16 (×2): 100 mg via ORAL
  Filled 2019-02-15 (×2): qty 1

## 2019-02-15 MED ORDER — ALPRAZOLAM 1 MG PO TABS
1.0000 mg | ORAL_TABLET | Freq: Every day | ORAL | Status: DC
  Administered 2019-02-15: 1 mg via ORAL
  Filled 2019-02-15: qty 1

## 2019-02-15 MED ORDER — SUCCINYLCHOLINE CHLORIDE 200 MG/10ML IV SOSY
PREFILLED_SYRINGE | INTRAVENOUS | Status: DC | PRN
  Administered 2019-02-15: 120 mg via INTRAVENOUS

## 2019-02-15 MED ORDER — SUCCINYLCHOLINE CHLORIDE 200 MG/10ML IV SOSY
PREFILLED_SYRINGE | INTRAVENOUS | Status: AC
  Filled 2019-02-15: qty 10

## 2019-02-15 MED ORDER — SCOPOLAMINE 1 MG/3DAYS TD PT72
1.0000 | MEDICATED_PATCH | Freq: Once | TRANSDERMAL | Status: DC
  Administered 2019-02-15: 1.5 mg via TRANSDERMAL
  Filled 2019-02-15: qty 1

## 2019-02-15 MED ORDER — METOCLOPRAMIDE HCL 5 MG/ML IJ SOLN
5.0000 mg | Freq: Once | INTRAMUSCULAR | Status: AC
  Administered 2019-02-15: 5 mg via INTRAVENOUS
  Filled 2019-02-15: qty 2

## 2019-02-15 MED ORDER — PROPOFOL 10 MG/ML IV BOLUS
INTRAVENOUS | Status: DC | PRN
  Administered 2019-02-15: 150 mg via INTRAVENOUS

## 2019-02-15 MED ORDER — ROCURONIUM BROMIDE 50 MG/5ML IV SOSY
PREFILLED_SYRINGE | INTRAVENOUS | Status: DC | PRN
  Administered 2019-02-15: 10 mg via INTRAVENOUS
  Administered 2019-02-15: 50 mg via INTRAVENOUS

## 2019-02-15 MED ORDER — DEXAMETHASONE SODIUM PHOSPHATE 10 MG/ML IJ SOLN
INTRAMUSCULAR | Status: DC | PRN
  Administered 2019-02-15: 10 mg via INTRAVENOUS

## 2019-02-15 MED ORDER — ROCURONIUM BROMIDE 10 MG/ML (PF) SYRINGE
PREFILLED_SYRINGE | INTRAVENOUS | Status: AC
  Filled 2019-02-15: qty 10

## 2019-02-15 MED ORDER — ONDANSETRON HCL 4 MG/2ML IJ SOLN
4.0000 mg | Freq: Once | INTRAMUSCULAR | Status: AC
  Administered 2019-02-15: 4 mg via INTRAVENOUS

## 2019-02-15 MED ORDER — BUPIVACAINE-EPINEPHRINE 0.25% -1:200000 IJ SOLN
INTRAMUSCULAR | Status: DC | PRN
  Administered 2019-02-15: 10 mL

## 2019-02-15 MED ORDER — GABAPENTIN 100 MG PO CAPS
200.0000 mg | ORAL_CAPSULE | Freq: Every day | ORAL | Status: DC
  Administered 2019-02-15: 200 mg via ORAL
  Filled 2019-02-15: qty 2

## 2019-02-15 MED ORDER — GABAPENTIN 100 MG PO CAPS
100.0000 mg | ORAL_CAPSULE | Freq: Every day | ORAL | Status: DC
  Administered 2019-02-16: 100 mg via ORAL
  Filled 2019-02-15: qty 1

## 2019-02-15 MED ORDER — PROMETHAZINE HCL 25 MG/ML IJ SOLN: 6.2500 mg | INTRAMUSCULAR | Status: DC | PRN

## 2019-02-15 MED ORDER — HYDROMORPHONE HCL 1 MG/ML IJ SOLN
INTRAMUSCULAR | Status: AC
  Administered 2019-02-15: 18:00:00 0.5 mg via INTRAVENOUS
  Filled 2019-02-15: qty 1

## 2019-02-15 MED ORDER — LAMOTRIGINE 25 MG PO TABS
25.0000 mg | ORAL_TABLET | Freq: Every day | ORAL | Status: DC
  Administered 2019-02-15 – 2019-02-16 (×2): 25 mg via ORAL
  Filled 2019-02-15 (×2): qty 1

## 2019-02-15 MED ORDER — PANTOPRAZOLE SODIUM 40 MG PO TBEC
40.0000 mg | DELAYED_RELEASE_TABLET | Freq: Every day | ORAL | Status: DC
  Administered 2019-02-16: 40 mg via ORAL
  Filled 2019-02-15: qty 1

## 2019-02-15 MED ORDER — HYDROMORPHONE HCL 1 MG/ML IJ SOLN
0.2500 mg | INTRAMUSCULAR | Status: DC | PRN
  Administered 2019-02-15 (×4): 0.5 mg via INTRAVENOUS

## 2019-02-15 MED ORDER — ESCITALOPRAM OXALATE 20 MG PO TABS
20.0000 mg | ORAL_TABLET | Freq: Every day | ORAL | Status: DC
  Administered 2019-02-16: 20 mg via ORAL
  Filled 2019-02-15: qty 1

## 2019-02-15 MED ORDER — KCL IN DEXTROSE-NACL 20-5-0.45 MEQ/L-%-% IV SOLN
INTRAVENOUS | Status: DC
  Administered 2019-02-15: 20:00:00 via INTRAVENOUS
  Filled 2019-02-15 (×2): qty 1000

## 2019-02-15 MED ORDER — LABETALOL HCL 5 MG/ML IV SOLN
INTRAVENOUS | Status: AC
  Filled 2019-02-15: qty 4

## 2019-02-15 MED ORDER — BUPIVACAINE-EPINEPHRINE 0.5% -1:200000 IJ SOLN
INTRAMUSCULAR | Status: AC
  Filled 2019-02-15: qty 1

## 2019-02-15 MED ORDER — LIDOCAINE 2% (20 MG/ML) 5 ML SYRINGE
INTRAMUSCULAR | Status: AC
  Filled 2019-02-15: qty 5

## 2019-02-15 MED ORDER — METOCLOPRAMIDE HCL 5 MG/ML IJ SOLN: 5.0000 mg | Freq: Three times a day (TID) | INTRAMUSCULAR | Status: DC | PRN

## 2019-02-15 MED ORDER — OXYCODONE HCL 5 MG PO TABS
5.0000 mg | ORAL_TABLET | ORAL | Status: DC | PRN
  Administered 2019-02-15 – 2019-02-16 (×5): 10 mg via ORAL
  Filled 2019-02-15 (×5): qty 2

## 2019-02-15 MED ORDER — IOHEXOL 300 MG/ML  SOLN
INTRAMUSCULAR | Status: DC | PRN
  Administered 2019-02-15: 7 mL

## 2019-02-15 MED ORDER — LIDOCAINE 2% (20 MG/ML) 5 ML SYRINGE
INTRAMUSCULAR | Status: DC | PRN
  Administered 2019-02-15: 60 mg via INTRAVENOUS

## 2019-02-15 MED ORDER — LACTATED RINGERS IV SOLN
INTRAVENOUS | Status: DC
  Administered 2019-02-15 (×2): via INTRAVENOUS

## 2019-02-15 MED ORDER — ALPRAZOLAM 0.5 MG PO TABS
1.0000 mg | ORAL_TABLET | Freq: Once | ORAL | Status: AC
  Administered 2019-02-15: 06:00:00 1 mg via ORAL
  Filled 2019-02-15: qty 2

## 2019-02-15 MED ORDER — SODIUM CHLORIDE 0.9 % IV SOLN
2.0000 g | INTRAVENOUS | Status: AC
  Administered 2019-02-15: 15:00:00 2 g via INTRAVENOUS
  Filled 2019-02-15: qty 20

## 2019-02-15 MED ORDER — MIDAZOLAM HCL 2 MG/2ML IJ SOLN: 0.5000 mg | Freq: Once | INTRAMUSCULAR | Status: DC | PRN

## 2019-02-15 MED ORDER — FENTANYL CITRATE (PF) 100 MCG/2ML IJ SOLN
INTRAMUSCULAR | Status: DC | PRN
  Administered 2019-02-15: 100 ug via INTRAVENOUS
  Administered 2019-02-15: 50 ug via INTRAVENOUS
  Administered 2019-02-15 (×2): 100 ug via INTRAVENOUS

## 2019-02-15 MED ORDER — LACTATED RINGERS IV SOLN
INTRAVENOUS | Status: DC
  Administered 2019-02-15: 19:00:00 via INTRAVENOUS

## 2019-02-15 MED ORDER — FENTANYL CITRATE (PF) 250 MCG/5ML IJ SOLN
INTRAMUSCULAR | Status: AC
  Filled 2019-02-15: qty 5

## 2019-02-15 MED ORDER — FENTANYL CITRATE (PF) 100 MCG/2ML IJ SOLN
INTRAMUSCULAR | Status: AC
  Filled 2019-02-15: qty 2

## 2019-02-15 MED ORDER — SUGAMMADEX SODIUM 200 MG/2ML IV SOLN
INTRAVENOUS | Status: DC | PRN
  Administered 2019-02-15: 400 mg via INTRAVENOUS

## 2019-02-15 SURGICAL SUPPLY — 42 items
APPLICATOR ARISTA FLEXITIP XL (MISCELLANEOUS) IMPLANT
APPLIER CLIP 5 13 M/L LIGAMAX5 (MISCELLANEOUS) ×4
APPLIER CLIP ROT 10 11.4 M/L (STAPLE)
CABLE HIGH FREQUENCY MONO STRZ (ELECTRODE) ×2 IMPLANT
CHLORAPREP W/TINT 26 (MISCELLANEOUS) ×4 IMPLANT
CLIP APPLIE 5 13 M/L LIGAMAX5 (MISCELLANEOUS) ×2 IMPLANT
CLIP APPLIE ROT 10 11.4 M/L (STAPLE) IMPLANT
COVER MAYO STAND STRL (DRAPES) ×2 IMPLANT
COVER SURGICAL LIGHT HANDLE (MISCELLANEOUS) ×2 IMPLANT
COVER WAND RF STERILE (DRAPES) IMPLANT
DECANTER SPIKE VIAL GLASS SM (MISCELLANEOUS) ×2 IMPLANT
DERMABOND ADVANCED (GAUZE/BANDAGES/DRESSINGS) ×1
DERMABOND ADVANCED .7 DNX12 (GAUZE/BANDAGES/DRESSINGS) ×1 IMPLANT
DISSECTOR BLUNT TIP ENDO 5MM (MISCELLANEOUS) IMPLANT
DRAPE C-ARM 42X120 X-RAY (DRAPES) ×2 IMPLANT
ELECT PENCIL ROCKER SW 15FT (MISCELLANEOUS) ×2 IMPLANT
ELECT REM PT RETURN 15FT ADLT (MISCELLANEOUS) ×2 IMPLANT
GLOVE BIO SURGEON STRL SZ7.5 (GLOVE) ×2 IMPLANT
GLOVE INDICATOR 8.0 STRL GRN (GLOVE) ×2 IMPLANT
GOWN STRL REUS W/TWL XL LVL3 (GOWN DISPOSABLE) ×4 IMPLANT
GRASPER SUT TROCAR 14GX15 (MISCELLANEOUS) ×2 IMPLANT
HEMOSTAT ARISTA ABSORB 3G PWDR (HEMOSTASIS) IMPLANT
HEMOSTAT SNOW SURGICEL 2X4 (HEMOSTASIS) IMPLANT
KIT BASIN OR (CUSTOM PROCEDURE TRAY) ×2 IMPLANT
KIT TURNOVER KIT A (KITS) IMPLANT
NEEDLE INSUFFLATION 14GA 120MM (NEEDLE) ×2 IMPLANT
POUCH SPECIMEN RETRIEVAL 10MM (ENDOMECHANICALS) ×2 IMPLANT
SCISSORS LAP 5X35 DISP (ENDOMECHANICALS) ×2 IMPLANT
SET CHOLANGIOGRAPH MIX (MISCELLANEOUS) ×2 IMPLANT
SET IRRIG TUBING LAPAROSCOPIC (IRRIGATION / IRRIGATOR) ×2 IMPLANT
SET TUBE SMOKE EVAC HIGH FLOW (TUBING) ×2 IMPLANT
SLEEVE ADV FIXATION 5X100MM (TROCAR) ×4 IMPLANT
SUT MNCRL AB 4-0 PS2 18 (SUTURE) ×2 IMPLANT
SUT VICRYL 0 UR6 27IN ABS (SUTURE) ×2 IMPLANT
SYR 10ML ECCENTRIC (SYRINGE) ×2 IMPLANT
TOWEL OR 17X26 10 PK STRL BLUE (TOWEL DISPOSABLE) ×2 IMPLANT
TOWEL OR NON WOVEN STRL DISP B (DISPOSABLE) IMPLANT
TRAY LAPAROSCOPIC (CUSTOM PROCEDURE TRAY) ×2 IMPLANT
TROCAR ADV FIXATION 12X100MM (TROCAR) IMPLANT
TROCAR ADV FIXATION 5X100MM (TROCAR) ×2 IMPLANT
TROCAR XCEL BLUNT TIP 100MML (ENDOMECHANICALS) ×2 IMPLANT
TROCAR XCEL NON-BLD 11X100MML (ENDOMECHANICALS) ×2 IMPLANT

## 2019-02-15 NOTE — Transfer of Care (Signed)
Immediate Anesthesia Transfer of Care Note  Patient: [name] [name]  Procedure(s) Performed: LAPAROSCOPIC CHOLECYSTECTOMY WITH INTRAOPERATIVE CHOLANGIOGRAM (N/A Abdomen)  Patient Location: PACU  Anesthesia Type:General  Level of Consciousness: awake, alert  and oriented  Airway & Oxygen Therapy: Patient Spontanous Breathing and Patient connected to face mask oxygen  Post-op Assessment: Report given to RN and Post -op Vital signs reviewed and stable  Post vital signs: Reviewed and stable  Last Vitals:  Vitals Value Taken Time  BP 149/103 02/15/19 1650  Temp    Pulse 89 02/15/19 1651  Resp 16 02/15/19 1651  SpO2 100 % 02/15/19 1651  Vitals shown include unvalidated device data.  Last Pain:  Vitals:   02/15/19 1334  TempSrc: Oral  PainSc:          Complications: No apparent anesthesia complications

## 2019-02-15 NOTE — Op Note (Signed)
02/14/2019 - 02/15/2019 4:29 PM  PATIENT: [name] [name]  33 y.o. female  Patient Care Team: Briscoe Deutscher, DO as PCP - General (Family Medicine) Crawford Givens, MD as Referring Physician (Obstetrics and Gynecology) Himmelrich, Bryson Ha, RD (Inactive) as Dietitian (Bariatrics)  PRE-OPERATIVE DIAGNOSIS: Biliary dyskinesia  POST-OPERATIVE DIAGNOSIS: Same  PROCEDURE: Laparoscopic cholecystectomy with intraoperative cholangiogram  SURGEON: Sharon Mt. Dema Severin, MD  ASSISTANT: Alferd Apa, PA-C  ANESTHESIA: General endotracheal  EBL: Total I/O In: 1000 [I.V.:1000] Out: 10 [Blood:10]  DRAINS: None  SPECIMEN: Gallbladder  COUNTS: Sponge, needle and instrument counts were reported correct x2 at the conclusion of the operation  DISPOSITION: PACU in satisfactory condition  COMPLICATIONS: None  FINDINGS: Dilated gallbladder. Critical view of safety obtained prior to clipping or dividing any structures. Intraoperative cholangiogram demonstrated normal appearing distal cystic duct and opacification of common bile duct as well as filling of duodenum. Ampulla was somewhat patulous as contrast freely flowed through it into duodenum but efflux back into the biliary radicals was difficult despite morphine/fentanyl administration. Well up on the liver bed, her cystic artery (both anterior and posterior branches) continued and was controlled with additional clips as minor bleeding from it was noted when taking gallbladder off liver bed.  INDICATION: Ms. Height is a very pleasant 32yoF with 8 month hx of intermittent RUQ abd pain.  Extensive evaluation by multiple providers including USN, EGD, colonoscopy, HIDA scan, and labs.  Now with persistent RUQ abd pain and nausea.  Recent HIDA scan with slightly low ejection fraction at 28%.  Labs with mild elevation of transaminases and alkaline phosphatase.  Patient referred to surgery for evaluation but told no appointments available until September,  therefore referred to ER for evaluation and management.  Past hx of gastric bypass procedure by Dr. Greer Pickerel.   I had discussed her case with Dr. Redmond Pulling and the patient and her husband. Given all the above, exhaustive workup and lifestyle limiting issues, we discussed proceeding with laparoscopic cholecystectomy for presumed biliary dyskinesia. Options were reviewed. Please refer to notes elsewhere for details regarding this discussion. She opted to proceed.  DESCRIPTION: The patient was identified & brought into the operating room. She was then positioned supine on the OR table. SCDs were in place and active during the entire case. She then underwent general endotracheal anesthesia. Pressure points were padded. The abdomen was prepped and draped in the standard sterile fashion. Antibiotics were administered. A surgical timeout was performed and confirmed our plan.   A Veress needle was introduced at Palmer's point in the LUQ.  Intraperitoneal location was confirmed by both aspiration and saline drop test.  The peritoneal cavity was then insufflated with CO2 to a maximum pressure of 15 mmHg.  A 5 mm optical view trocar was then placed at the left upper quadrant Palmer's point.  The laparoscope was inserted and intraperitoneal inspection revealed no evidence of Veress needle or trocar site complication.  An 11 mm trocar was placed in the supraumbilical position.  2 additional 5 mm trochars were placed in the right abdomen and a line extending along the right upper quadrant.   The liver and gallbladder were inspected.  The gallbladder appeared enlarged and dilated. The gallbladder fundus was grasped and elevated cephalad. An additional grasper was then placed on the infundibulum of the gallbladder and the infundibulum was retracted laterally. Staying high on the gallbladder, the peritoneum on both sides of the gallbladder was opened with hook cautery. Gentle blunt dissection was then employed with a  [place] dissector working down into [employer]cast[employer]'s triangle. The cystic duct was identified and carefully circumferentially dissected. The cystic artery was also identified and carefully circumferentially dissected. The space between the cystic artery and hepatocystic plate was developed such that a good view of the liver could be seen through a window medial to the cystic artery. The triangle of Calot had been cleared of all fibrofatty tissue. At this point, a critical view of safety was achieved and the only structures visualized was the skeletonized cystic duct laterally, the skeletonized cystic artery and the liver through the window medial to the artery.  Her cystic artery had a bifurcation noted with both anterior and posterior branches.  At this point attention was turned to performing a cholangiogram. A partial cystic duct-otomy was created. A 77F cholangiocatheter was then introduced through a puncture site at the right subcostal ridge of the abdominal wall and directed it into the cystic duct. This was then secured with a clip. A cholangiogram was then obtained using a dilute radio-opaque contrast and continuous fluoroscopy. Contrast flowed from a side branch consistent with cystic duct cannulization and additionally, valves of Heister were noted. Contrast flowed weakly flowed proximally up the common hepatic duct into the right and left intrahepatic chains despite administration of morphine/fentanyl. Contrast flowed distally into the common bile duct and easily across the ampulla into the duodenum.  This was consistent with a normal cholangiogram.  There were no filling defects. The cholangiocatheter was then removed.  The cystic duct and artery were clipped with 3 clips on the patient side and 1 clip on the specimen side. The cystic duct and artery were then divided. The gallbladder was then freed from its remaining attachments to the liver using electrocautery and placed into an endocatch bag.  While  taking the gallbladder off the liver bed, the cystic artery did split into anterior posterior branches well up on the gallbladder half way to the dome.  There was some minor bleeding from this.  This was controlled with additional clips.  The RUQ was gently irrigated with sterile saline. Hemostasis was then verified. The clips were in good position; the gallbladder fossa was dry. The rest of the abdomen was inspected no injury nor bleeding elsewhere was identified.  The endocatch bag containing the gallbladder was then removed from the umbilical port site and passed off as specimen. The RUQ ports were removed under direct visualization and noted to be hemostatic. The umbilical fascia was then closed using the 0 Vicryl suture x3. The fascia was palpated and noted to be completely closed. The skin of all incision sites was approximated with 4-0 monocryl subcuticular suture and dermabond applied. She was then awakened from anesthesia, extubated, and transferred to a stretcher for transport to PACU in satisfactory condition.

## 2019-02-15 NOTE — ED Notes (Signed)
Report given to Lauren, RN in the OR. Pt will be heading to pre-op around 12:45pm-1pm.

## 2019-02-15 NOTE — Progress Notes (Signed)
CC: Abdominal pain  Subjective: Sitting up trying to vomit she wants some Reglan.  Reports 5 years of severe depression.  Objective: Vital signs in last 24 hours: Temp:  [99.1 F (37.3 C)] 99.1 F (37.3 C) (08/31 1250) Pulse Rate:  [73-88] 73 (09/01 0700) Resp:  [15-18] 15 (09/01 0700) BP: (112-167)/(63-121) 120/71 (09/01 0700) SpO2:  [92 %-100 %] 97 % (09/01 0700) Weight:  [119.4 kg] 119.4 kg (08/31 1250)  1 temperature 99.9 yesterday, vital signs are stable Labs 02/14/19 were normal except for an alk phos of 151, AST of 49, ALT of 57. COVID negative Abdominal ultrasound 02/12/2019: No gallstones or wall thickening, CBD 6 mm. HIDA scan 8/25: Prompt uptake and biliary excretion of activity by the liver.  Gallbladder activity is visualized consistent with patency of the cystic duct,.  Biliary activity passes into the small bowel consistent with patent common bile duct.  EF 28% possible chronic cholecystitis.    Intake/Output from previous day: 08/31 0701 - 09/01 0700 In: 2680.7 [IV Piggyback:2680.7] Out: -  Intake/Output this shift: No intake/output data recorded.  General appearance: alert, cooperative, mild distress and Nauseated, trying to vomit. Resp: clear to auscultation bilaterally GI: Complains of tenderness right upper quadrant but I cannot really examine her in her current position.  Lab Results:  Recent Labs    02/12/19 2030 02/14/19 1451  WBC 6.9 7.1  HGB 12.9 12.5  HCT 41.9 40.0  PLT 343 351    BMET Recent Labs    02/12/19 2030 02/14/19 1451  NA 140 141  K 4.3 4.5  CL 104 106  CO2 27 27  GLUCOSE 95 88  BUN 10 12  CREATININE 0.57 0.65  CALCIUM 9.1 9.1   PT/INR No results for input(s): LABPROT, INR in the last 72 hours.  Recent Labs  Lab 02/12/19 2030 02/14/19 1451  AST 74* 49*  ALT 73* 57*  ALKPHOS 164* 151*  BILITOT 0.3 0.4  PROT 7.7 7.1  ALBUMIN 4.0 3.8     Lipase     Component Value Date/Time   LIPASE 27 02/14/2019 1451      Prior to Admission medications   Medication Sig Start Date End Date Taking? Authorizing Provider  acetaminophen (TYLENOL) 500 MG tablet Take 2 tablets (1,000 mg total) by mouth every 6 (six) hours as needed for moderate pain. 02/13/19  Yes Domenic Moras, PA-C  ALPRAZolam Duanne Moron) 1 MG tablet Take 1 tablet (1 mg total) by mouth at bedtime. 02/09/19  Yes Briscoe Deutscher, DO  CALCIUM-MAGNESIUM-ZINC PO Take 3 tablets by mouth daily.   Yes [provider]  diclofenac sodium (VOLTAREN) 1 % GEL Apply topically to affected area qid Patient taking differently: Apply 2 g topically 4 (four) times daily as needed (pain). Apply topically to affected area qid prn for pain 12/29/17  Yes Gerda Diss, DO  escitalopram (LEXAPRO) 20 MG tablet Take 1 tablet (20 mg total) by mouth daily. 04/20/18  Yes Briscoe Deutscher, DO  gabapentin (NEURONTIN) 100 MG capsule 170m in the morning and 200 mg at night Patient taking differently: Take 100-200 mg by mouth 2 (two) times daily. 1064min the morning and 200 mg at night 08/17/18  Yes WaBriscoe DeutscherDO  lamoTRIgine (LAMICTAL) 25 MG tablet Take one tab daily for 21 days then two daily after. Patient taking differently: Take 25 mg by mouth daily.  02/02/19  Yes WaBriscoe DeutscherDO  linaclotide (LINZESS) 72 MCG capsule Take 1 capsule (72 mcg total) by mouth  daily. 07/06/18  Yes Milus Banister, MD  Menaquinone-7 (VITAMIN K2 PO) Take 1 capsule by mouth daily.    Yes [provider]  Multiple Vitamins-Iron (ONE-TABLET-DAILY/IRON PO) Take 1 tablet by mouth daily.   Yes [provider]  ondansetron (ZOFRAN) 4 MG tablet Take 1 tablet (4 mg total) by mouth every 8 (eight) hours as needed for nausea or vomiting. 02/13/19  Yes Domenic Moras, PA-C  pantoprazole (PROTONIX) 40 MG tablet Take 1 tablet (40 mg total) by mouth daily. 01/25/19  Yes Esterwood, Amy S, PA-C  promethazine (PHENERGAN) 25 MG tablet Take 1 tablet (25 mg total) by mouth every 6 (six) hours as needed  for nausea. 01/10/19  Yes Davonna Belling, MD  traMADol (ULTRAM) 50 MG tablet Take 1 tablet (50 mg total) by mouth every 6 (six) hours as needed for severe pain. 02/13/19  Yes Domenic Moras, PA-C  valACYclovir (VALTREX) 500 MG tablet 2 po in am and 2 po in pm x 1 day. Patient taking differently: Take 500 mg by mouth daily as needed (cold sores). 2 po in am and 2 po in pm x 1 day. 07/20/18  Yes Briscoe Deutscher, DO  Vitamin D, Ergocalciferol, (DRISDOL) 1.25 MG (50000 UT) CAPS capsule Take 50,000 Units by mouth every Monday.   Yes [provider]  dicyclomine (BENTYL) 20 MG tablet Take 1 tablet (20 mg total) by mouth 2 (two) times daily. Patient not taking: Reported on 02/12/2019 01/25/19 02/13/19  Esterwood, Amy S, PA-C    Medications: . sodium chloride flush  3 mL Intravenous Once   . 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 Oretha Caprice 07/26/2018 Hx migraines Obesity -BMI 43.8 Hx polycystic ovarian syndrome Hx nephrolithiasis Hx hypothyroid  Abdominal pain/nausea and vomiting Biliary dyskinesia Mild LFT elevation  FEN: IV fluids/n.p.o. ID: None DVT: SCDs Follow-up: To be determined  Plan: Dr. Dema Severin will review shortly.    LOS: 0 days    Stoney Karczewski 02/15/2019 [phone]

## 2019-02-15 NOTE — Progress Notes (Signed)
Pt ambulated with minimal assist to bathroom from PACU stretcher

## 2019-02-15 NOTE — Telephone Encounter (Signed)
See note  Copied from [place] [id number]. Topic: General - Other >> Feb 15, 2019  4:43 PM Rainey Pines A wrote: [employer] called to verify that disability forms that were faxed for patient on 8/27 we received. Best contact number [phone]

## 2019-02-15 NOTE — ED Notes (Signed)
Pt called out stating she is still feeling nauseated and now hot that she feels she may be having an anxiety attack. Cool rag placed on patients neck and this nurse asked secretary to page hospitalist.

## 2019-02-15 NOTE — Anesthesia Postprocedure Evaluation (Signed)
Anesthesia Post Note  Patient: [name] [name]  Procedure(s) Performed: LAPAROSCOPIC CHOLECYSTECTOMY WITH INTRAOPERATIVE CHOLANGIOGRAM (N/A Abdomen)     Patient location during evaluation: PACU Anesthesia Type: General Level of consciousness: sedated, oriented and patient cooperative Pain management: pain level controlled (pain improving) Vital Signs Assessment: post-procedure vital signs reviewed and stable Respiratory status: spontaneous breathing, nonlabored ventilation and respiratory function stable Cardiovascular status: blood pressure returned to baseline and stable Postop Assessment: no apparent nausea or vomiting Anesthetic complications: no    Last Vitals:  Vitals:   02/15/19 1745 02/15/19 1819  BP: 138/82 134/85  Pulse: 80 71  Resp: 13 14  Temp: 36.7 C   SpO2: 95% 96%    Last Pain:  Vitals:   02/15/19 1830  TempSrc:   PainSc: 8                  [name] [name],E. Rajni Holsworth

## 2019-02-15 NOTE — Anesthesia Procedure Notes (Signed)
Procedure Name: Intubation Date/Time: 02/15/2019 2:46 PM Performed by: Maxwell Caul, CRNA Pre-anesthesia Checklist: Patient identified, Emergency Drugs available, Suction available and Patient being monitored Patient Re-evaluated:Patient Re-evaluated prior to induction Oxygen Delivery Method: Circle system utilized Preoxygenation: Pre-oxygenation with 100% oxygen Induction Type: IV induction, Rapid sequence and Cricoid Pressure applied Laryngoscope Size: Mac and 4 Grade View: Grade I Tube type: Oral Tube size: 7.5 mm Number of attempts: 1 Airway Equipment and Method: Stylet Placement Confirmation: ETT inserted through vocal cords under direct vision,  positive ETCO2 and breath sounds checked- equal and bilateral Secured at: 21 cm Tube secured with: Tape Dental Injury: Teeth and Oropharynx as per pre-operative assessment

## 2019-02-15 NOTE — ED Notes (Signed)
Gave report to [place], Therapist, sports. Pt went to [facility] ED 16.

## 2019-02-15 NOTE — Anesthesia Preprocedure Evaluation (Addendum)
Anesthesia Evaluation  Patient identified by MRN, date of birth, ID band Patient awake    Reviewed: NPO status , Patient's Chart, lab work & pertinent test results, reviewed documented beta blocker date and time   History of Anesthesia Complications (+) PONV  Airway Mallampati: II  TM Distance: >3 FB Neck ROM: Full    Dental  (+) Teeth Intact, Dental Advisory Given   Pulmonary  02/14/2019 SARS coronavirus NEG   breath sounds clear to auscultation       Cardiovascular hypertension,  Rhythm:Regular Rate:Normal     Neuro/Psych  Headaches, Anxiety Depression    GI/Hepatic GERD  Medicated,  Endo/Other  Hypothyroidism PCOS  Renal/GU      Musculoskeletal   Abdominal (+) + obese,   Peds  Hematology   Anesthesia Other Findings   Reproductive/Obstetrics                            Anesthesia Physical Anesthesia Plan  ASA: II  Anesthesia Plan: General   Post-op Pain Management:    Induction: Rapid sequence and Cricoid pressure planned  PONV Risk Score and Plan: 4 or greater and Scopolamine patch - Pre-op, Dexamethasone and Ondansetron  Airway Management Planned: Oral ETT  Additional Equipment:   Intra-op Plan:   Post-operative Plan: Extubation in OR  Informed Consent: I have reviewed the patients History and Physical, chart, labs and discussed the procedure including the risks, benefits and alternatives for the proposed anesthesia with the patient or authorized representative who has indicated his/her understanding and acceptance.     Dental advisory given  Plan Discussed with: CRNA, Anesthesiologist and Surgeon  Anesthesia Plan Comments: (Obesity, Nausea with vomiting today. Plan GA with RSI  Roberts Gaudy)      Anesthesia Quick Evaluation

## 2019-02-16 ENCOUNTER — Encounter (HOSPITAL_COMMUNITY): Payer: Self-pay | Admitting: Surgery

## 2019-02-16 ENCOUNTER — Other Ambulatory Visit: Payer: Self-pay

## 2019-02-16 DIAGNOSIS — M79672 Pain in left foot: Secondary | ICD-10-CM

## 2019-02-16 LAB — COMPREHENSIVE METABOLIC PANEL
ALT: 54 U/L — ABNORMAL HIGH (ref 0–44)
AST: 54 U/L — ABNORMAL HIGH (ref 15–41)
Albumin: 3.8 g/dL (ref 3.5–5.0)
Alkaline Phosphatase: 142 U/L — ABNORMAL HIGH (ref 38–126)
Anion gap: 10 (ref 5–15)
BUN: 6 mg/dL (ref 6–20)
CO2: 25 mmol/L (ref 22–32)
Calcium: 9.5 mg/dL (ref 8.9–10.3)
Chloride: 105 mmol/L (ref 98–111)
Creatinine, Ser: 0.66 mg/dL (ref 0.44–1.00)
GFR calc Af Amer: >60 mL/min (ref >60)
GFR calc non Af Amer: >60 mL/min (ref >60)
Glucose, Bld: 130 mg/dL — ABNORMAL HIGH (ref 70–99)
Potassium: 4.7 mmol/L (ref 3.5–5.1)
Sodium: 140 mmol/L (ref 135–145)
Total Bilirubin: 0.3 mg/dL (ref 0.3–1.2)
Total Protein: 7 g/dL (ref 6.5–8.1)

## 2019-02-16 LAB — CBC
HCT: 39 % (ref 36.0–46.0)
Hemoglobin: 12.2 g/dL (ref 12.0–15.0)
MCH: 26.8 pg (ref 26.0–34.0)
MCHC: 31.3 g/dL (ref 30.0–36.0)
MCV: 85.5 fL (ref 80.0–100.0)
Platelets: 370 10*3/uL (ref 150–400)
RBC: 4.56 MIL/uL (ref 3.87–5.11)
RDW: 14.8 % (ref 11.5–15.5)
WBC: 11.4 10*3/uL — ABNORMAL HIGH (ref 4.0–10.5)
nRBC: 0 % (ref 0.0–0.2)

## 2019-02-16 MED ORDER — LIDOCAINE HCL (PF) 1 % IJ SOLN
INTRAMUSCULAR | Status: AC
  Filled 2019-02-16: qty 30

## 2019-02-16 MED ORDER — ONDANSETRON 4 MG PO TBDP: 4.0000 mg | ORAL_TABLET | Freq: Four times a day (QID) | ORAL | 0 refills | Status: DC | PRN

## 2019-02-16 MED ORDER — LIDOCAINE HCL (PF) 1 % IJ SOLN
5.0000 mL | Freq: Once | INTRAMUSCULAR | Status: DC
  Filled 2019-02-16: qty 5

## 2019-02-16 MED ORDER — OXYCODONE HCL 5 MG PO TABS: 5.0000 mg | ORAL_TABLET | Freq: Four times a day (QID) | ORAL | 0 refills | Status: DC | PRN

## 2019-02-16 MED ORDER — METHOCARBAMOL 500 MG PO TABS: 500.0000 mg | ORAL_TABLET | Freq: Four times a day (QID) | ORAL | Status: DC | PRN

## 2019-02-16 MED ORDER — LIDOCAINE HCL 1 % IJ SOLN: 5.0000 mL | Freq: Once | INTRAMUSCULAR | Status: DC

## 2019-02-16 MED ORDER — GABAPENTIN 100 MG PO CAPS: ORAL_CAPSULE | ORAL | 2 refills | Status: DC

## 2019-02-16 MED FILL — oxyCODONE HCL 5 MG TABS: 5 | 3 days supply | Qty: 15 | Fill #0

## 2019-02-16 MED FILL — ONDANSETRON ODT 4 MG TABLET: 4 | 5 days supply | Qty: 20 | Fill #0

## 2019-02-16 NOTE — Progress Notes (Signed)
During bedside report RN accessed abdomen, margins were unattached at umbilicus port incision, moderate amount of serosanguineous drainage, no odor present. LOC: alert and oriented Pain management: rates pain 3/10 VS: assessed and stable Interventions: On call surgeon paged, incision reinforced with gauze and paper tape.  On call surgeon at bedside to access incision, Dermabond, gauze and paper tape applied to site.

## 2019-02-16 NOTE — Plan of Care (Signed)
  Problem: Health Behavior/Discharge Planning: Goal: Ability to manage health-related needs will improve Outcome: Adequate for Discharge   Problem: Education: Goal: Knowledge of General Education information will improve Description: Including pain rating scale, medication(s)/side effects and non-pharmacologic comfort measures Outcome: Adequate for Discharge   Problem: Activity: Goal: Risk for activity intolerance will decrease Outcome: Adequate for Discharge   Problem: Nutrition: Goal: Adequate nutrition will be maintained Outcome: Adequate for Discharge   Problem: Pain Managment: Goal: General experience of comfort will improve Outcome: Adequate for Discharge

## 2019-02-16 NOTE — Progress Notes (Addendum)
Nurse paged because patients supraumbilical incision was noted to have opened again. Patient's husband expressed that he would not allow for Dr. Dema Severin to enter the room. They were offered for Dr. Redmond Pulling to visit them in the hospital but felt this was not necessary. They would like to follow up with him as outpatient and would not like to see the PA for follow up in [facility]. The supraumbilical incision was examined. The bottom appears to have separated slightly. Dermabond adhesive was removed. This appears to be only on the skin level. The base of the wound is intake without evidence of evisceration.The area was cleansed. After adequate numbing her umbilical incision was closed with staples. Patient tolerated the procedure. She is stable for discharge.

## 2019-02-16 NOTE — Discharge Summary (Signed)
Patient ID: [name] [name] [id number] 03-Apr-1986 33 y.o.  Admit date: 02/14/2019 Discharge date: 02/16/2019  Admitting Diagnosis: Abdominal pain, nausea Biliary dyskinesia Elevated liver enzymes  Discharge Diagnosis Patient Active Problem List   Diagnosis Date Noted  . Biliary dyskinesia 02/14/2019  . RUQ abdominal pain 02/14/2019  . Elevated liver enzymes 02/14/2019  . Pain in right hand 12/02/2018  . Abnormal serum level of alkaline phosphatase 11/21/2018  . Pain in both lower extremities 11/21/2018  . Nocturnal leg cramps 10/26/2018  . Tachycardia 10/26/2018  . Bilateral lower extremity edema 10/26/2018  . Low serum ferritin level 10/26/2018  . Abdominal pain 08/17/2018  . Nephrolithiasis   . Bleeding diathesis ([place]) 10/13/2017  . Depressive disorder 07/09/2017  . Dysmenorrhea 07/09/2017  . Menorrhagia 07/09/2017  . Reduced libido, due to Lexapro 07/09/2017  . History of cesarean section 07/09/2017  . Binge eating disorder 04/23/2017  . Morbid obesity ([place]) 04/23/2017  . 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 Pes planus 07/16/2016  . Hallux varus, acquired, right 03/18/2016  . Iron deficiency anemia 02/22/2015  . 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 Anxiety 04/10/2010    Consultants None  H&P: Patient is a 33 yo WF with 8 month hx of intermittent RUQ abd pain.  Extensive evaluation by multiple providers including USN, EGD, colonoscopy, HIDA scan, and labs.  Now with persistent RUQ abd pain and nausea.  Recent HIDA scan with slightly low ejection fraction at 28%.  Labs with mild elevation of transaminases and alkaline phosphatase.  Patient referred to surgery for evaluation but told no appointments available until September, therefore referred to ER for evaluation and management.  General surgery call for evaluation and admission. Past hx of gastric bypass procedure by Dr. Greer Pickerel.  Husband at bedside. 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 Dema Severin - 02/15/2019  Hospital Course:  The patient was admitted and underwent a laparoscopic cholecystectomy with IOC.  The patient tolerated the procedure well. IOC was negative. Umbilical incision was noted to reopen once patient was transferred to the floor. This was reclosed with dermabond. On POD 1, the patient was tolerating a diet, voiding well, mobilizing, and pain was controlled.  The patient was stable for DC home at this time with appropriate follow up made.  Physical Exam: Gen:  Alert, NAD, pleasant Lungs: Normal rate and effort  Abd: Soft, ND, appropriately tender, +BS, Incisions with glue intact appears well and are without drainage, bleeding, or signs of infection.  Ext:  No LE edema  Skin: no rashes noted, warm and dry  Allergies as of 02/16/2019      Reactions   Ambien [zolpidem] Other (See Comments)   Sleep walking    Nsaids    Gastric bypass      Medication List    TAKE these medications   acetaminophen 500 MG tablet Commonly known as: TYLENOL Take 2 tablets (1,000 mg total) by mouth every 6 (six) hours as needed for moderate pain.   ALPRAZolam 1 MG tablet Commonly known as: XANAX Take 1 tablet (1 mg total) by mouth at bedtime.   CALCIUM-MAGNESIUM-ZINC PO Take 3 tablets by mouth daily.   diclofenac sodium 1 % Gel Commonly known as: Voltaren Apply topically to affected area qid What changed:   how much to take  how to take this  when to take this  reasons to take this  additional instructions  escitalopram 20 MG tablet Commonly known as: Lexapro Take 1 tablet (20 mg total) by mouth daily.   gabapentin 100 MG capsule Commonly known as: NEURONTIN 100mg  in the morning and 200 mg at night What changed:   how much to take  how to take this  when to take this   lamoTRIgine 25 MG tablet Commonly known as: LaMICtal Take one tab daily for 21 days then two daily after.  What changed:   how much to take  how to take this  when to take this  additional instructions   linaclotide 72 MCG capsule Commonly known as: Linzess Take 1 capsule (72 mcg total) by mouth daily.   ondansetron 4 MG disintegrating tablet Commonly known as: ZOFRAN-ODT Take 1 tablet (4 mg total) by mouth every 6 (six) hours as needed for nausea.   ondansetron 4 MG tablet Commonly known as: ZOFRAN Take 1 tablet (4 mg total) by mouth every 8 (eight) hours as needed for nausea or vomiting.   ONE-TABLET-DAILY/IRON PO Take 1 tablet by mouth daily.   oxyCODONE 5 MG immediate release tablet Commonly known as: Oxy IR/ROXICODONE Take 1 tablet (5 mg total) by mouth every 6 (six) hours as needed for breakthrough pain.   pantoprazole 40 MG tablet Commonly known as: PROTONIX Take 1 tablet (40 mg total) by mouth daily.   promethazine 25 MG tablet Commonly known as: PHENERGAN Take 1 tablet (25 mg total) by mouth every 6 (six) hours as needed for nausea.   traMADol 50 MG tablet Commonly known as: ULTRAM Take 1 tablet (50 mg total) by mouth every 6 (six) hours as needed for severe pain.   valACYclovir 500 MG tablet Commonly known as: Valtrex 2 po in am and 2 po in pm x 1 day. What changed:   how much to take  how to take this  when to take this  reasons to take this   Vitamin D (Ergocalciferol) 1.25 MG (50000 UT) Caps capsule Commonly known as: DRISDOL Take 50,000 Units by mouth every Monday.   VITAMIN K2 PO Take 1 capsule by mouth daily.        Follow-up Information    [facility] up on 03/01/2019.   Specialty: General Surgery Why: Your appointment is at 11:30AM.  Be at the office 30 minutes early for check in.  Bring photo ID and insurance information. Contact information: [address] [place] [address] [phone]        Helane RimaWallace, Erica, DO Follow up.   Specialty: Family Medicine Why: Please call to schedule a follow  up appointment  Contact information: [address] [place] [address] [phone]           Signed: Leary RocaMichael Marvelyn Bouchillon, [facility] 02/16/2019, 8:15 AM Pager: [phone]

## 2019-02-16 NOTE — Discharge Instructions (Signed)
LAPAROSCOPIC SURGERY: POST OP INSTRUCTIONS  ######################################################################  EAT Gradually transition to a high fiber diet with a fiber supplement over the next few weeks after discharge.  Start with a pureed / full liquid diet (see below)  WALK Walk an hour a day.  Control your pain to do that.    CONTROL PAIN Control pain so that you can walk, sleep, tolerate sneezing/coughing, go up/down stairs.  HAVE A BOWEL MOVEMENT DAILY Keep your bowels regular to avoid problems.  OK to try a laxative to override constipation.  OK to use an antidairrheal to slow down diarrhea.  Call if not better after 2 tries  CALL IF YOU HAVE PROBLEMS/CONCERNS Call if you are still struggling despite following these instructions. Call if you have concerns not answered by these instructions  ######################################################################    1. DIET: Follow a light bland diet & liquids the first 24 hours after arrival home, such as soup, liquids, starches, etc.  Be sure to drink plenty of fluids.  Quickly advance to a usual solid diet within a few days.  Avoid fast food or heavy meals as your are more likely to get nauseated or have irregular bowels.  A low-fat, high-fiber diet for the rest of your life is ideal.  2. Take your usually prescribed home medications unless otherwise directed.  3. PAIN CONTROL: a. Pain is best controlled by a usual combination of three different methods TOGETHER: i. Ice/Heat ii. Over the counter pain medication iii. Prescription pain medication b. Most patients will experience some swelling and bruising around the incisions.  Ice packs or heating pads (30-60 minutes up to 6 times a day) will help. Use ice for the first few days to help decrease swelling and bruising, then switch to heat to help relax tight/sore spots and speed recovery.  Some people prefer to use ice alone, heat alone, alternating between ice & heat.   Experiment to what works for you.  Swelling and bruising can take several weeks to resolve.   c. It is helpful to take an over-the-counter pain medication regularly for the first few weeks.  Choose one of the following that works best for you: i. Naproxen (Aleve, etc)  Two 237m tabs twice a day ii. Ibuprofen (Advil, etc) Three 2013mtabs four times a day (every meal & bedtime) iii. Acetaminophen (Tylenol, etc) 500-6508mour times a day (every meal & bedtime) d. A  prescription for pain medication (such as oxycodone, hydrocodone, tramadol, gabapentin, methocarbamol, etc) should be given to you upon discharge.  Take your pain medication as prescribed.  i. If you are having problems/concerns with the prescription medicine (does not control pain, nausea, vomiting, rash, itching, etc), please call us [phone] see if we need to switch you to a different pain medicine that will work better for you and/or control your side effect better. ii. If you need a refill on your pain medication, please give us [place] hour notice.  contact your pharmacy.  They will contact our office to request authorization. Prescriptions will not be filled after 5 pm or on week-ends  4. Avoid getting constipated.   a. Between the surgery and the pain medications, it is common to experience some constipation.   b. Increasing fluid intake and taking a fiber supplement (such as Metamucil, Citrucel, FiberCon, MiraLax, etc) 1-2 times a day regularly will usually help prevent this problem from occurring.   c. A mild laxative (prune juice, Milk of Magnesia, MiraLax, etc) should be taken according to  package directions if there are no bowel movements after 48 hours.   5. Watch out for diarrhea.   a. If you have many loose bowel movements, simplify your diet to bland foods & liquids for a few days.   b. Stop any stool softeners and decrease your fiber supplement.   c. Switching to mild anti-diarrheal medications (Kayopectate, Pepto  Bismol) can help.   d. If this worsens or does not improve, please call us.  6. Wash / shower every day.  You may shower over the dressings as they are waterproof.  Continue to shower over incision(s) after the dressing is off.  7. Remove your waterproof bandages 5 days after surgery.  You may leave the incision open to air.  You may replace a dressing/Band-Aid to cover the incision for comfort if you wish.   8. ACTIVITIES as tolerated:   a. You may resume regular (light) daily activities beginning the next day--such as daily self-care, walking, climbing stairs--gradually increasing activities as tolerated.  If you can walk 30 minutes without difficulty, it is safe to try more intense activity such as jogging, treadmill, bicycling, low-impact aerobics, swimming, etc. b. Save the most intensive and strenuous activity for last such as sit-ups, heavy lifting, contact sports, etc  Refrain from any heavy lifting or straining until you are off narcotics for pain control.   c. DO NOT PUSH THROUGH PAIN.  Let pain be your guide: If it hurts to do something, don't do it.  Pain is your body warning you to avoid that activity for another week until the pain goes down. d. You may drive when you are no longer taking prescription pain medication, you can comfortably wear a seatbelt, and you can safely maneuver your car and apply brakes. e. Bonita QuinYou may have sexual intercourse when it is comfortable.  9. FOLLOW UP in our office a. Please call [facility] at [phone] to set up an appointment to see your surgeon in the office for a follow-up appointment approximately 2-3 weeks after your surgery. b. Make sure that you call for this appointment the day you arrive home to insure a convenient appointment time.  10. IF YOU HAVE DISABILITY OR FAMILY LEAVE FORMS, BRING THEM TO THE OFFICE FOR PROCESSING.  DO NOT GIVE THEM TO YOUR DOCTOR.   WHEN TO CALL US [phone]: 1. Poor pain control 2. Reactions / problems with new  medications (rash/itching, nausea, etc)  3. Fever over 101.5 F (38.5 C) 4. Inability to urinate 5. Nausea and/or vomiting 6. Worsening swelling or bruising 7. Continued bleeding from incision. 8. Increased pain, redness, or drainage from the incision   The clinic staff is available to answer your questions during regular business hours (8:30am-5pm).  Please dont hesitate to call and ask to speak to one of our nurses for clinical concerns.   If you have a medical emergency, go to the nearest emergency room or call 911.  A surgeon from [facility] is always on call at the [facility]   [facility] [place] [address], [address], [place], [place]  [address] ? MAIN: [phone] ? TOLL FREE: [phone] ?  FAX [phone] [email or web address]   Gallbladder Eating Plan If you have a gallbladder condition, you may have trouble digesting fats. Eating a low-fat diet can help reduce your symptoms, and may be helpful before and after having surgery to remove your gallbladder (cholecystectomy). Your health care provider may recommend that you work with a diet and  nutrition specialist (dietitian) to help you reduce the amount of fat in your diet. What are tips for following this plan? General guidelines  Limit your fat intake to less than 30% of your total daily calories. If you eat around 1,800 calories each day, this is less than 60 grams (g) of fat per day.  Fat is an important part of a healthy diet. Eating a low-fat diet can make it hard to maintain a healthy body weight. Ask your dietitian how much fat, calories, and other nutrients you need each day.  Eat small, frequent meals throughout the day instead of three large meals.  Drink at least 8-10 cups of fluid a day. Drink enough fluid to keep your urine clear or pale yellow.  Limit alcohol intake to no more than 1 drink a day for nonpregnant women and 2 drinks a day for men. One drink equals  12 oz of beer, 5 oz of wine, or 1 oz of hard liquor. Reading food labels  Check Nutrition Facts on food labels for the amount of fat per serving. Choose foods with less than 3 grams of fat per serving. Shopping  Choose nonfat and low-fat healthy foods. Look for the words nonfat, low fat, or fat free.  Avoid buying processed or prepackaged foods. Cooking  Cook using low-fat methods, such as baking, broiling, grilling, or boiling.  Cook with small amounts of healthy fats, such as olive oil, grapeseed oil, canola oil, or sunflower oil. What foods are recommended?   All fresh, frozen, or canned fruits and vegetables.  Whole grains.  Low-fat or non-fat (skim) milk and yogurt.  Lean meat, skinless poultry, fish, eggs, and beans.  Low-fat protein supplement powders or drinks.  Spices and herbs. What foods are not recommended?  High-fat foods. These include baked goods, fast food, fatty cuts of meat, ice cream, french toast, sweet rolls, pizza, cheese bread, foods covered with butter, creamy sauces, or cheese.  Fried foods. These include french fries, tempura, battered fish, breaded chicken, fried breads, and sweets.  Foods with strong odors.  Foods that cause bloating and gas. Summary  A low-fat diet can be helpful if you have a gallbladder condition, or before and after gallbladder surgery.  Limit your fat intake to less than 30% of your total daily calories. This is about 60 g of fat if you eat 1,800 calories each day.  Eat small, frequent meals throughout the day instead of three large meals. This information is not intended to replace advice given to you by your health care provider. Make sure you discuss any questions you have with your health care provider. Document Released: 06/07/2013 Document Revised: 09/23/2018 Document Reviewed: 07/10/2016 Elsevier Patient Education  2020 [employer].

## 2019-02-16 NOTE — Progress Notes (Signed)
   02/16/19 1100  Incision - 4 Ports Abdomen 1: Umbilicus 2: Lower;Right 3: Left;Lower 4: Upper;Left  Placement Date/Time: 02/15/19 1505   Location of Ports: Abdomen  Port: 1:  Location Orientation: Umbilicus  Port: 2:  Location Orientation: Lower;Right  Port: 3:  Location Orientation: Left;Lower  Port: 4:  Location Orientation: Upper;Left  Port 1 Site Assessment Bleeding;Other (Comment)  Port 1 Margins Unattacted edges (unapproximated)  Port 1 Drainage Amount Moderate  Port 1 Drainage Description Serosanguineous  Port 1 Dressing Type Other (Comment) (site looks unattached, MD paged.)

## 2019-02-18 ENCOUNTER — Other Ambulatory Visit: Payer: Self-pay | Admitting: *Deleted

## 2019-02-18 MED FILL — GABAPENTIN 100 MG CAPSULE: 100 | 30 days supply | Qty: 90 | Fill #2

## 2019-02-18 MED FILL — ALPRAZolam 1 MG TABS: 1 | 10 days supply | Qty: 30 | Fill #1

## 2019-02-18 NOTE — Patient Outreach (Addendum)
[facility]) Care Management  02/18/2019  [name] [name] [birth year] [id number]  Transition of care telephone call  Referral received: 02/18/19 Initial outreach: 02/18/19 Surgery/procedure date: 02/15/19 Insurance: [place]  Initial unsuccessful telephone call to patient's preferred (mobile) number in order to complete transition of care assessment; no answer, left HIPAA compliant voicemail message requesting return call.   Objective: Per the electronic medical record, [name] [name] was hospitalized at [facility] from 8/31-9/2 with chronic cholecystitis . On 02/15/19 she had laparoscopic cholecystectomy with intraoperative cholangiogram.  Comorbidities include: migraines, GERD, polycystic ovarian syndrome, hypothyroidism, anxiety, binge eating disorder, depressive disorder, hx of Roux-en-Y gastric bypass, iron deficiency anemia, opiate overdose, tachycardia  She was discharged to home on 02/16/19 without the need for home health services or durable medical equipment per the discharge summary.  She completed a post hospital discharge follow up call with her primary care provider office on 02/16/19. She has a post hospital discharge follow up appointment with her primary care provider scheduled for 03/04/19 at 1:00 pm and an appointment with her surgeon on 03/01/19 at 11:30 am.  Plan: If no return call from the patient by end of business day today, this RNCM will route unsuccessful outreach letter with [place] Management pamphlet and 24 hour Nurse Advice Line Magnet to [place] [employer] clinical pool to be mailed to patient's home address. RNCM will attempt another outreach within 4 business days.  Barrington Ellison RN,CCM,CDE [place] Management Coordinator Office Phone [phone] Office Fax [phone]

## 2019-02-23 ENCOUNTER — Encounter: Payer: Self-pay | Admitting: Family Medicine

## 2019-02-23 ENCOUNTER — Other Ambulatory Visit: Payer: Self-pay | Admitting: *Deleted

## 2019-02-23 ENCOUNTER — Encounter: Payer: Self-pay | Admitting: *Deleted
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
## 2019-02-24 NOTE — Telephone Encounter (Signed)
Patient has emailed me forms I will have filled out and fax back asap

## 2019-02-28 MED FILL — ALPRAZolam 1 MG TABS: 1 | 10 days supply | Qty: 30 | Fill #2

## 2019-03-01 ENCOUNTER — Encounter: Payer: Self-pay | Admitting: Family Medicine

## 2019-03-03 ENCOUNTER — Other Ambulatory Visit: Payer: Self-pay

## 2019-03-03 ENCOUNTER — Telehealth: Payer: Self-pay

## 2019-03-03 NOTE — Telephone Encounter (Signed)
Patient has been muscle spasms in abdomen around incision area. She has mentioned to Dr. Redmond Pulling about but told her it would be uncomfortable for a little bit.  Gabapentin and over the counter medications are not helping much. She has tried heating pad. She has had pain multiple times a day and will last anywhere from few seconds to 15 min.

## 2019-03-04 ENCOUNTER — Ambulatory Visit: Payer: No Typology Code available for payment source | Admitting: Family Medicine

## 2019-03-06 NOTE — Telephone Encounter (Signed)
Check in on Monday and let me know if not improved.

## 2019-03-07 MED FILL — lamoTRIgine 25 MG TABS: 25 | 30 days supply | Qty: 60 | Fill #0

## 2019-03-07 NOTE — Telephone Encounter (Signed)
Called patient she states the she was informed that if she took Protonix she could take ibuprofen short term. This has been helping some. They have gotten a lot better. Let her now I will call back if any changes needed.

## 2019-03-08 MED FILL — ALPRAZolam 1 MG TABS: 1 | 30 days supply | Qty: 30 | Fill #3

## 2019-03-10 NOTE — Progress Notes (Signed)
[name] [name] is a 33 y.o. female is here for follow up.  History of Present Illness:   HPI: Depression and anxiety symptoms have improved significantly. Her husband and mother have noted a great change. She is still having some trouble staying asleep. S/P cholecystectomy. Umbilical incision dehisced but now healing well. Some abdominal spasms, getting better. She asks if I would agree to increasing Xanax at night. Husband on board (he is a helpful gauge).   There are no preventive care reminders to display for this patient. Depression screen [facility] 2/9 03/11/2019 08/17/2018 06/01/2018  Decreased Interest 1 1 1   Down, Depressed, Hopeless 1 0 2  PHQ - 2 Score 2 1 3   Altered sleeping 3 3 3   Tired, decreased energy 1 1 2   Change in appetite 2 3 2   Feeling bad or failure about yourself  1 0 0  Trouble concentrating 0 0 1  Moving slowly or fidgety/restless 0 0 0  Suicidal thoughts 0 0 0  PHQ-9 Score 9 8 11   Difficult doing work/chores Somewhat difficult - Very difficult  Some recent data might be hidden   PMHx, SurgHx, SocialHx, FamHx, Medications, and Allergies were reviewed in the Visit Navigator and updated as appropriate.   Patient Active Problem List   Diagnosis Date Noted  . Cold sore 03/13/2019  . Insomnia due to other mental disorder 03/13/2019  . Severe episode of recurrent major depressive disorder, without psychotic features ([place]) 03/13/2019  . Biliary dyskinesia 02/14/2019  . RUQ abdominal pain 02/14/2019  . Elevated liver enzymes 02/14/2019  . Pain in right hand 12/02/2018  . Abnormal serum level of alkaline phosphatase 11/21/2018  . Pain in both lower extremities 11/21/2018  . Nocturnal leg cramps 10/26/2018  . Tachycardia 10/26/2018  . Bilateral lower extremity edema 10/26/2018  . Low serum ferritin level 10/26/2018  . Abdominal pain 08/17/2018  . Nephrolithiasis   . Bleeding diathesis ([place]) 10/13/2017  . Depressive disorder 07/09/2017  . Dysmenorrhea  07/09/2017  . Menorrhagia 07/09/2017  . Reduced libido, due to Lexapro 07/09/2017  . History of cesarean section 07/09/2017  . Binge eating disorder 04/23/2017  . Morbid obesity ([place]) 04/23/2017  . 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 Pes planus 07/16/2016  . Hallux varus, acquired, right 03/18/2016  . Iron deficiency anemia 02/22/2015  . 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 Anxiety 04/10/2010   Social History   Tobacco Use  . Smoking status: Never Smoker  . Smokeless tobacco: Never Used  Substance Use Topics  . Alcohol use: No    Comment: rare/socially  . Drug use: No   Current Medications and Allergies   .  acetaminophen (TYLENOL) 500 MG tablet, Take 2 tablets (1,000 mg total) by mouth every 6 (six) hours as needed for moderate pain., Disp: 30 tablet, Rfl: 0 .  ALPRAZolam (XANAX) 1 MG tablet, Take 1 tablet (1 mg total) by mouth at bedtime., Disp: 30 tablet, Rfl: 3 .  CALCIUM-MAGNESIUM-ZINC PO, Take 3 tablets by mouth daily., Disp: , Rfl:  .  diclofenac sodium (VOLTAREN) 1 % GEL, Apply topically to affected area qid (Patient not taking: Reported on 02/23/2019), Disp: 100 g, Rfl: 1 .  escitalopram (LEXAPRO) 20 MG tablet, Take 1 tablet (20 mg total) by mouth daily., Disp: 90 tablet, Rfl: 2 .  gabapentin (NEURONTIN) 100 MG capsule, 100mg  in the morning and 200 mg at night, Disp: 270 capsule, Rfl: 2 .  lamoTRIgine (LAMICTAL) 25 MG tablet, Take one tab daily  for 21 days then two daily after. (Patient taking differently: Take 25 mg by mouth daily. ), Disp: 37 tablet, Rfl: 0 .  linaclotide (LINZESS) 72 MCG capsule, Take 1 capsule (72 mcg total) by mouth daily., Disp: 30 capsule, Rfl: 5 .  Menaquinone-7 (VITAMIN K2 PO), Take 1 capsule by mouth daily. , Disp: , Rfl:  .  Multiple Vitamins-Iron (ONE-TABLET-DAILY/IRON PO), Take 1 tablet by mouth daily., Disp: , Rfl:  .  ondansetron (ZOFRAN-ODT) 4 MG disintegrating tablet, Take 1 tablet (4 mg total) by mouth every 6 (six)  hours as needed for nausea., Disp: 20 tablet, Rfl: 0 .  pantoprazole (PROTONIX) 40 MG tablet, Take 1 tablet (40 mg total) by mouth daily., Disp: 30 tablet, Rfl: 3 .  valACYclovir (VALTREX) 500 MG tablet, 2 po in am and 2 po in pm x 1 day. (Patient not taking: Reported on 02/23/2019), Disp: 5 tablet, Rfl: 3 .  Vitamin D, Ergocalciferol, (DRISDOL) 1.25 MG (50000 UT) CAPS capsule, Take 50,000 Units by mouth every Monday., Disp: , Rfl:    Allergies  Allergen Reactions  . Ambien [Zolpidem] Other (See Comments)    Sleep walking   . Nsaids     Gastric bypass   Review of Systems   Pertinent items are noted in the HPI. Otherwise, a complete ROS is negative.  Vitals   Vitals:   03/11/19 1434  BP: 131/84  Pulse: 77  Temp: 98.5 F (36.9 C)  TempSrc: Temporal  SpO2: 97%  Weight: 256 lb 12.8 oz (116.5 kg)  Height:  (1.651 m)     Body mass index is 42.73 kg/m.  Physical Exam   Physical Exam Vitals signs and nursing note reviewed.  HENT:     Head: Normocephalic and atraumatic.  Eyes:     Pupils: Pupils are equal, round, and reactive to light.  Neck:     Musculoskeletal: Normal range of motion and neck supple.  Cardiovascular:     Rate and Rhythm: Normal rate and regular rhythm.     Heart sounds: Normal heart sounds.  Pulmonary:     Effort: Pulmonary effort is normal.  Abdominal:     Palpations: Abdomen is soft.  Skin:    General: Skin is warm.     Comments: Incisions healing well. Still a few mm area of healing via secondary intention.   Psychiatric:        Behavior: Behavior normal.    Results for orders placed or performed in visit on 03/11/19  CBC with Differential/Platelet  Result Value Ref Range   WBC 7.3 3.8 - 10.8 Thousand/uL   RBC 4.26 3.80 - 5.10 Million/uL   Hemoglobin 11.3 (L) 11.7 - 15.5 g/dL   HCT 40.9 (L) 81.1 - 91.4 %   MCV 80.5 80.0 - 100.0 fL   MCH 26.5 (L) 27.0 - 33.0 pg   MCHC 32.9 32.0 - 36.0 g/dL   RDW 78.2 95.6 - 21.3 %   Platelets 335 140  - 400 Thousand/uL   MPV 10.9 7.5 - 12.5 fL   Neutro Abs 4,541 1,500 - 7,800 cells/uL   Lymphs Abs 2,008 850 - 3,900 cells/uL   Absolute Monocytes 672 200 - 950 cells/uL   Eosinophils Absolute 58 15 - 500 cells/uL   Basophils Absolute 22 0 - 200 cells/uL   Neutrophils Relative % 62.2 %   Total Lymphocyte 27.5 %   Monocytes Relative 9.2 %   Eosinophils Relative 0.8 %   Basophils Relative 0.3 %  Comprehensive metabolic  panel  Result Value Ref Range   Glucose, Bld 79 65 - 99 mg/dL   BUN 14 7 - 25 mg/dL   Creat 8.36 6.29 - 4.76 mg/dL   BUN/Creatinine Ratio NOT APPLICABLE 6 - 22 (calc)   Sodium 140 135 - 146 mmol/L   Potassium 4.2 3.5 - 5.3 mmol/L   Chloride 107 98 - 110 mmol/L   CO2 22 20 - 32 mmol/L   Calcium 9.1 8.6 - 10.2 mg/dL   Total Protein 6.4 6.1 - 8.1 g/dL   Albumin 4.2 3.6 - 5.1 g/dL   Globulin 2.2 1.9 - 3.7 g/dL (calc)   AG Ratio 1.9 1.0 - 2.5 (calc)   Total Bilirubin 0.3 0.2 - 1.2 mg/dL   Alkaline phosphatase (APISO) 125 31 - 125 U/L   AST 19 10 - 30 U/L   ALT 15 6 - 29 U/L    Assessment and Plan   Zilda was seen today for follow-up.  Diagnoses and all orders for this visit:  Elevated liver enzymes Comments: s/p lap cholecystectomy. Labs today.  Orders: -     Cancel: CBC with Differential/Platelet; Future -     Cancel: Comprehensive metabolic panel; Future -     Comprehensive metabolic panel; Future -     Comprehensive metabolic panel  Need for immunization against influenza -     Flu Vaccine QUAD 36+ mos IM -     Cancel: CBC with Differential/Platelet; Future -     Cancel: Comprehensive metabolic panel; Future  Cold sore -     valACYclovir (VALTREX) 500 MG tablet; 2 po in am and 2 po in pm x 1 day. -     Cancel: CBC with Differential/Platelet; Future -     Cancel: Comprehensive metabolic panel; Future  History of Roux-en-Y gastric bypass Comments: Recommended Bariatric MVM.  Morbid obesity (HCC)  Iron deficiency anemia secondary to inadequate  dietary iron intake Comments: Will advise patient to increase iron supplementation.  Orders: -     CBC with Differential/Platelet; Future -     CBC with Differential/Platelet  Insomnia due to other mental disorder Comments: Discussed options. Okay temporary increase in Xanax. Rec trial Rozerem. She will try to replace the q hs Xanax with this.  Orders: -     ALPRAZolam (XANAX) 1 MG tablet; Take 1-2 tablets (1-2 mg total) by mouth 2 (two) times daily as needed for anxiety. 1-2 tab QHS -     ramelteon (ROZEREM) 8 MG tablet; Take 1 tablet (8 mg total) by mouth at bedtime.  Nocturnal leg cramps Comments: Okay to add tizanidine to regimen prn for leg and abdominal wall spasms.  Orders: -     tizanidine (ZANAFLEX) 2 MG capsule; Take 2 capsules (4 mg total) by mouth 3 (three) times daily. -     gabapentin (NEURONTIN) 100 MG capsule; 100mg  in the morning and 200 mg at night  Recurrent major depressive disorder, in partial remission (HCC) -     escitalopram (LEXAPRO) 20 MG tablet; Take 1 tablet (20 mg total) by mouth daily. -     lamoTRIgine (LAMICTAL) 25 MG tablet; Take one tab daily for 21 days then two daily after.    . Orders and follow up as documented in EpicCare, reviewed diet, exercise and weight control, cardiovascular risk and specific lipid/LDL goals reviewed, reviewed medications and side effects in detail.  . Reviewed expectations re: course of current medical issues. . Outlined signs and symptoms indicating need for more acute intervention.  Patient verbalized understanding and all questions were answered. . Patient received an After Visit Summary.  Helane RimaErica Aianna Fahs, DO [place] 03/13/2019

## 2019-03-11 ENCOUNTER — Other Ambulatory Visit: Payer: Self-pay | Admitting: Family Medicine

## 2019-03-11 ENCOUNTER — Ambulatory Visit (INDEPENDENT_AMBULATORY_CARE_PROVIDER_SITE_OTHER): Payer: No Typology Code available for payment source | Admitting: Family Medicine

## 2019-03-11 ENCOUNTER — Other Ambulatory Visit: Payer: Self-pay

## 2019-03-11 ENCOUNTER — Telehealth: Payer: Self-pay | Admitting: Family Medicine

## 2019-03-11 ENCOUNTER — Telehealth: Payer: Self-pay | Admitting: Physical Therapy

## 2019-03-11 ENCOUNTER — Encounter: Payer: Self-pay | Admitting: Family Medicine

## 2019-03-11 VITALS — BP 131/84 | HR 77 | Temp 98.5°F | Ht 65.0 in | Wt 256.8 lb

## 2019-03-11 DIAGNOSIS — D508 Other iron deficiency anemias: Secondary | ICD-10-CM

## 2019-03-11 DIAGNOSIS — Z23 Encounter for immunization: Secondary | ICD-10-CM | POA: Diagnosis not present

## 2019-03-11 DIAGNOSIS — B001 Herpesviral vesicular dermatitis: Secondary | ICD-10-CM | POA: Diagnosis not present

## 2019-03-11 DIAGNOSIS — F3341 Major depressive disorder, recurrent, in partial remission: Secondary | ICD-10-CM

## 2019-03-11 DIAGNOSIS — F99 Mental disorder, not otherwise specified: Secondary | ICD-10-CM

## 2019-03-11 DIAGNOSIS — R748 Abnormal levels of other serum enzymes: Secondary | ICD-10-CM

## 2019-03-11 DIAGNOSIS — Z9884 Bariatric surgery status: Secondary | ICD-10-CM

## 2019-03-11 DIAGNOSIS — F5105 Insomnia due to other mental disorder: Secondary | ICD-10-CM

## 2019-03-11 DIAGNOSIS — G4762 Sleep related leg cramps: Secondary | ICD-10-CM

## 2019-03-11 MED ORDER — GABAPENTIN 100 MG PO CAPS: ORAL_CAPSULE | ORAL | 2 refills | Status: DC

## 2019-03-11 MED ORDER — ESCITALOPRAM OXALATE 20 MG PO TABS: 20.0000 mg | ORAL_TABLET | Freq: Every day | ORAL | 2 refills | Status: DC

## 2019-03-11 MED ORDER — ALPRAZOLAM 1 MG PO TABS: 1.0000 mg | ORAL_TABLET | Freq: Two times a day (BID) | ORAL | 1 refills | Status: DC | PRN

## 2019-03-11 MED ORDER — RAMELTEON 8 MG PO TABS: 8.0000 mg | ORAL_TABLET | Freq: Every day | ORAL | 3 refills | Status: DC

## 2019-03-11 MED ORDER — VALACYCLOVIR HCL 500 MG PO TABS: ORAL_TABLET | ORAL | 3 refills | Status: DC

## 2019-03-11 MED ORDER — LAMOTRIGINE 25 MG PO TABS: ORAL_TABLET | ORAL | 1 refills | Status: DC

## 2019-03-11 MED ORDER — TIZANIDINE HCL 2 MG PO CAPS: 4.0000 mg | ORAL_CAPSULE | Freq: Three times a day (TID) | ORAL | 0 refills | Status: DC

## 2019-03-11 MED FILL — ESCITALOPRAM 20 MG TABLET: 20 | 90 days supply | Qty: 90 | Fill #0

## 2019-03-11 MED FILL — VALACYCLOVIR HCL 500 MG TAB: 500 | 1 days supply | Qty: 5 | Fill #0

## 2019-03-11 MED FILL — tiZANidine HCL 2 MG TABS: 2 | 15 days supply | Qty: 90 | Fill #0

## 2019-03-11 NOTE — Telephone Encounter (Signed)
Called gave ok to change med.

## 2019-03-11 NOTE — Patient Instructions (Addendum)
I'll be down the road at [place].  We can help. Call [facility] in [place] at [phone] or in [place] at [phone] during regular business hours and ask to speak with our bereavement team.  [email or web address]

## 2019-03-11 NOTE — Telephone Encounter (Signed)
Copied from [place] [phone]. Topic: General - Other >> Mar 11, 2019  2:57 PM Rainey Pines A wrote: [facility] called to verify that patients forms were received on yesterday 03/10/2019. Best contact number [phone]

## 2019-03-11 NOTE — Telephone Encounter (Signed)
Daphine calling from [facility] calling to see if the   tizanidine (ZANAFLEX) 2 MG capsule [[id number]]   CanX be replaced to tablet vs capsules? That all they have.   Needing clarity on xanax prescription as well Please advise   CB- [phone]

## 2019-03-12 LAB — COMPREHENSIVE METABOLIC PANEL
AG Ratio: 1.9 (calc) (ref 1.0–2.5)
ALT: 15 U/L (ref 6–29)
AST: 19 U/L (ref 10–30)
Albumin: 4.2 g/dL (ref 3.6–5.1)
Alkaline phosphatase (APISO): 125 U/L (ref 31–125)
BUN: 14 mg/dL (ref 7–25)
CO2: 22 mmol/L (ref 20–32)
Calcium: 9.1 mg/dL (ref 8.6–10.2)
Chloride: 107 mmol/L (ref 98–110)
Creat: 0.62 mg/dL (ref 0.50–1.10)
Globulin: 2.2 g/dL (calc) (ref 1.9–3.7)
Glucose, Bld: 79 mg/dL (ref 65–99)
Potassium: 4.2 mmol/L (ref 3.5–5.3)
Sodium: 140 mmol/L (ref 135–146)
Total Bilirubin: 0.3 mg/dL (ref 0.2–1.2)
Total Protein: 6.4 g/dL (ref 6.1–8.1)

## 2019-03-12 LAB — CBC WITH DIFFERENTIAL/PLATELET
Absolute Monocytes: 672 cells/uL (ref 200–950)
Basophils Absolute: 22 cells/uL (ref 0–200)
Basophils Relative: 0.3 %
Eosinophils Absolute: 58 cells/uL (ref 15–500)
Eosinophils Relative: 0.8 %
HCT: 34.3 % — ABNORMAL LOW (ref 35.0–45.0)
Hemoglobin: 11.3 g/dL — ABNORMAL LOW (ref 11.7–15.5)
Lymphs Abs: 2008 cells/uL (ref 850–3900)
MCH: 26.5 pg — ABNORMAL LOW (ref 27.0–33.0)
MCHC: 32.9 g/dL (ref 32.0–36.0)
MCV: 80.5 fL (ref 80.0–100.0)
MPV: 10.9 fL (ref 7.5–12.5)
Monocytes Relative: 9.2 %
Neutro Abs: 4541 cells/uL (ref 1500–7800)
Neutrophils Relative %: 62.2 %
Platelets: 335 10*3/uL (ref 140–400)
RBC: 4.26 10*6/uL (ref 3.80–5.10)
RDW: 14.9 % (ref 11.0–15.0)
Total Lymphocyte: 27.5 %
WBC: 7.3 10*3/uL (ref 3.8–10.8)

## 2019-03-13 DIAGNOSIS — F332 Major depressive disorder, recurrent severe without psychotic features: Secondary | ICD-10-CM | POA: Insufficient documentation

## 2019-03-13 DIAGNOSIS — B001 Herpesviral vesicular dermatitis: Secondary | ICD-10-CM | POA: Insufficient documentation

## 2019-03-13 DIAGNOSIS — F99 Mental disorder, not otherwise specified: Secondary | ICD-10-CM | POA: Insufficient documentation

## 2019-03-13 DIAGNOSIS — F5105 Insomnia due to other mental disorder: Secondary | ICD-10-CM | POA: Insufficient documentation

## 2019-03-14 ENCOUNTER — Encounter: Payer: Self-pay | Admitting: Gastroenterology

## 2019-03-14 MED FILL — GABAPENTIN 100 MG CAPSULE: 100 | 90 days supply | Qty: 270 | Fill #0

## 2019-03-14 NOTE — Telephone Encounter (Signed)
Please see message and advise.  Thank you. Rx not covered by pt's insurance Last OV 03/11/19

## 2019-03-14 NOTE — Telephone Encounter (Signed)
Forms are being completed by Jo[place].

## 2019-03-14 NOTE — Telephone Encounter (Signed)
How expensive? Prior authorization needed?

## 2019-03-16 NOTE — Telephone Encounter (Signed)
PA started today.  

## 2019-03-16 NOTE — Telephone Encounter (Signed)
Yes, PA needed.

## 2019-03-21 ENCOUNTER — Encounter: Payer: Self-pay | Admitting: Family Medicine

## 2019-03-21 ENCOUNTER — Ambulatory Visit (INDEPENDENT_AMBULATORY_CARE_PROVIDER_SITE_OTHER): Payer: No Typology Code available for payment source | Admitting: Psychology

## 2019-03-21 DIAGNOSIS — F331 Major depressive disorder, recurrent, moderate: Secondary | ICD-10-CM

## 2019-03-21 MED FILL — PANTOPRAZOLE SOD DR 40 MG T: 40 | 30 days supply | Qty: 30 | Fill #1

## 2019-03-21 MED FILL — ALPRAZolam 1 MG TABS: 1 | 45 days supply | Qty: 90 | Fill #0

## 2019-03-21 MED FILL — VALACYCLOVIR HCL 500 MG TAB: 500 | 1 days supply | Qty: 5 | Fill #2

## 2019-03-28 ENCOUNTER — Other Ambulatory Visit: Payer: Self-pay

## 2019-03-28 ENCOUNTER — Ambulatory Visit: Payer: No Typology Code available for payment source | Admitting: Neurology

## 2019-03-28 ENCOUNTER — Encounter: Payer: Self-pay | Admitting: Neurology

## 2019-03-28 VITALS — BP 143/89 | HR 114 | Ht 64.0 in | Wt 259.0 lb

## 2019-03-28 DIAGNOSIS — R0683 Snoring: Secondary | ICD-10-CM | POA: Diagnosis not present

## 2019-03-28 DIAGNOSIS — R519 Headache, unspecified: Secondary | ICD-10-CM

## 2019-03-28 DIAGNOSIS — G4719 Other hypersomnia: Secondary | ICD-10-CM

## 2019-03-28 DIAGNOSIS — Z820 Family history of epilepsy and other diseases of the nervous system: Secondary | ICD-10-CM

## 2019-03-28 DIAGNOSIS — Z6841 Body Mass Index (BMI) 40.0 and over, adult: Secondary | ICD-10-CM

## 2019-03-28 DIAGNOSIS — G4700 Insomnia, unspecified: Secondary | ICD-10-CM

## 2019-03-28 DIAGNOSIS — R0681 Apnea, not elsewhere classified: Secondary | ICD-10-CM

## 2019-03-28 DIAGNOSIS — G479 Sleep disorder, unspecified: Secondary | ICD-10-CM

## 2019-03-28 NOTE — Progress Notes (Signed)
Subjective:    Patient ID: [name] [name] is a 33 y.o. female.  HPI     [name], MD, PhD [facility] [address], [address] [address] [place], [place] [address]  Dear Dr. Earlene Plater, I saw your patient, [name] [name], upon your kind request to my sleep clinic today for initial consultation of her sleep disorder, in particular, concern for underlying obstructive sleep apnea.  The patient is unaccompanied today.  As you know, [name] [name] is a 33 year old right-handed woman with an underlying medical history of hypothyroidism, depression, anxiety, anemia, reflux disease, kidney stones, PCO S, and morbid obesity with a BMI of over 40, Status post weight loss surgery in 2014, who Reports snoring and difficulty initiating sleep.  I reviewed your office note from 03/11/2019.  Of note, she is on potentially sedating medications, she was recently started on Rozerem.  She has been on Xanax as needed.  She takes it at night.  She also takes Lexapro and Lamictal as well as gabapentin and Zanaflex.  Her Epworth sleepiness score is 12 out of 24, fatigue severity score is 58 out of 63.  She had recent gallbladder surgery and is taking the gabapentin and Zanaflex for pain and spasms.  She has been taking Xanax 2 mg at night for nighttime anxiety and to help her sleep.  It does not help her sleep as well as the Ambien did in the past but she did have sleepwalking episodes on Ambien.  She has had difficulty falling asleep and staying asleep for years.  She could not fill the Rozerem as it was not covered by her insurance.  In the past, she tried trazodone which did not help and Belsomra which did not help.  She was on lorazepam but then switched to alprazolam.  She has a family history of sleep apnea in her biological father who died at age 63, was not on CPAP therapy.  Her sister has sleep apnea and uses CPAP.  Her stepdad also had a CPAP machine.  She had gastric bypass surgery in 2014  but gained a lot of weight back, primarily because of flareup of anxiety and depression.  She has started seeing a counselor and will be considering cognitive behavioral therapy with another specialist as she indicates.  She has woken up with the occasional headache.  She has restless leg symptoms and takes magnesium for this.  She is a non-smoker, drinks alcohol rarely, caffeine in the form of soda, 2 diet sodas per day, 16 ounce bottles.  She works as a Fish farm manager, currently on [employer] for the past 2 months due to her mental health.  She lives with her husband and 2 girls, ages 67 and 74.  She usually takes a 2-hour nap after she gets her daughters up and ready for school.  Her husband has noted gasping sounds while she is asleep and has mentioned her snoring.  Her Past Medical History Is Significant For: Past Medical History:  Diagnosis Date  . Anemia   . Anxiety   . DEPRESSION   . GERD (gastroesophageal reflux disease)   . Hypothyroidism    Hx of, normalized TSH during pregnancy 2011  . Infertility, female   . Migraines   . Morbid obesity (HCC)    s/p RY 08/2012 - start weight 290 pounds  . Nephrolithiasis   . PCOS (polycystic ovarian syndrome)   . PONV (postoperative nausea and vomiting)   . Pregnancy induced hypertension  Her Past Surgical History Is Significant For: Past Surgical History:  Procedure Laterality Date  . Quintella ReichertIKEN OSTEOTOMY Left 06/24/2017   Procedure: Ralene BatheAIKEN OSTEOTOMY;  Surgeon: Vivi BarrackWagoner, Matthew R, DPM;  Location: [facility];  Service: Podiatry;  Laterality: Left;  . BUNIONECTOMY Left 06/24/2017   Procedure: Anthoney HaradaALSTON BUNIONECTOMY;  Surgeon: Vivi BarrackWagoner, Matthew R, DPM;  Location: [facility];  Service: Podiatry;  Laterality: Left;  . CESAREAN SECTION     x 2  . CHOLECYSTECTOMY N/A 02/15/2019   Procedure: LAPAROSCOPIC CHOLECYSTECTOMY WITH INTRAOPERATIVE CHOLANGIOGRAM;  Surgeon: Andria MeuseWhite, Christopher M, MD;  Location: [facility];  Service: General;  Laterality:  N/A;  . FOOT SURGERY    . 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   Procedure: LAPAROSCOPIC ROUX-EN-Y GASTRIC;  Surgeon: Atilano InaEric M Wilson, MD;  Location: [facility];  Service: General;  Laterality: N/A;  laparoscopic roux-en-y gastric bypass  . LITHOTRIPSY Left   . TONSILLECTOMY    . TUBAL LIGATION    . UPPER GI ENDOSCOPY  08/24/2012   Procedure: UPPER GI ENDOSCOPY;  Surgeon: Atilano InaEric M Wilson, MD;  Location: [facility];  Service: General;;  . WISDOM TOOTH EXTRACTION      Her Family History Is Significant For: Family History  Problem Relation Age of Onset  . Hyperlipidemia Father   . Hypertension Father   . Kidney disease Father   . Colon cancer Father [id number]  . Hypertension Mother   . Hypertension Maternal Grandmother   . Uterine cancer Maternal Grandmother [id number]  . Diabetes Maternal Grandfather     Her Social History Is Significant For: Social History   Socioeconomic History  . Marital status: Married    Spouse name: Not on file  . Number of children: Not on file  . Years of education: Not on file  . Highest education level: Not on file  Occupational History  . Not on file  Social Needs  . Financial resource strain: Not on file  . Food insecurity    Worry: Not on file    Inability: Not on file  . Transportation needs    Medical: No    Non-medical: No  Tobacco Use  . Smoking status: Never Smoker  . Smokeless tobacco: Never Used  Substance and Sexual Activity  . Alcohol use: No    Comment: rare/socially  . Drug use: No  . Sexual activity: Yes    Birth control/protection: Other-see comments    Comment: tubal ligation  Lifestyle  . Physical activity    Days per week: Not on file    Minutes per session: Not on file  . Stress: Not on file  Relationships  . Social Musicianconnections    Talks on phone: Not on file    Gets together: Not on file    Attends religious service: Not on file    Active member of club or organization: Not on file    Attends meetings of clubs or organizations: Not on file     Relationship status: Not on file  Other Topics Concern  . Not on file  Social History Narrative   Married, lives with spouse and dtr born 07/2009. 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    Her Allergies Are:  Allergies  Allergen Reactions  . Ambien [Zolpidem] Other (See Comments)    Sleep walking   . Nsaids     Gastric bypass  :   Her Current Medications Are:  Outpatient Encounter Medications as of 03/28/2019  Medication Sig  . acetaminophen (TYLENOL) 500 MG tablet  Take 2 tablets (1,000 mg total) by mouth every 6 (six) hours as needed for moderate pain.  Marland Kitchen ALPRAZolam (XANAX) 1 MG tablet Take 1-2 tablets (1-2 mg total) by mouth 2 (two) times daily as needed for anxiety. 1-2 tab QHS (Patient taking differently: Take 1-2 mg by mouth at bedtime as needed for anxiety. 1-2 tab QHS)  . CALCIUM-MAGNESIUM-ZINC PO Take 3 tablets by mouth daily.  . diclofenac sodium (VOLTAREN) 1 % GEL Apply topically to affected area qid  . escitalopram (LEXAPRO) 20 MG tablet Take 1 tablet (20 mg total) by mouth daily.  Marland Kitchen gabapentin (NEURONTIN) 100 MG capsule 100mg  in the morning and 200 mg at night  . lamoTRIgine (LAMICTAL) 25 MG tablet Take 50 mg by mouth daily.  Marland Kitchen linaclotide (LINZESS) 72 MCG capsule Take 1 capsule (72 mcg total) by mouth daily.  . Menaquinone-7 (VITAMIN K2 PO) Take 1 capsule by mouth daily.   . Multiple Vitamins-Iron (ONE-TABLET-DAILY/IRON PO) Take 1 tablet by mouth daily.  . ondansetron (ZOFRAN-ODT) 4 MG disintegrating tablet Take 1 tablet (4 mg total) by mouth every 6 (six) hours as needed for nausea.  . pantoprazole (PROTONIX) 40 MG tablet Take 1 tablet (40 mg total) by mouth daily.  . ramelteon (ROZEREM) 8 MG tablet Take 1 tablet (8 mg total) by mouth at bedtime.  . tizanidine (ZANAFLEX) 2 MG capsule Take 2 capsules (4 mg total) by mouth 3 (three) times daily.  . valACYclovir (VALTREX) 500 MG tablet 2 po in am and 2 po in pm x 1 day.  . Vitamin D, Ergocalciferol, (DRISDOL) 1.25 MG (50000  UT) CAPS capsule Take 50,000 Units by mouth every Monday.  . [DISCONTINUED] dicyclomine (BENTYL) 20 MG tablet Take 1 tablet (20 mg total) by mouth 2 (two) times daily. (Patient not taking: Reported on 02/12/2019)  . [DISCONTINUED] lamoTRIgine (LAMICTAL) 25 MG tablet Take one tab daily for 21 days then two daily after. (Patient taking differently: Take 50 mg by mouth daily. )   No facility-administered encounter medications on file as of 03/28/2019.   :  Review of Systems:  Out of a complete 14 point review of systems, all are reviewed and negative with the exception of these symptoms as listed below:  Review of Systems  Neurological:       Pt presents today to discuss her sleep. Pt has never had a sleep study but does endorse snoring.  Epworth Sleepiness Scale 0= would never doze 1= slight chance of dozing 2= moderate chance of dozing 3= high chance of dozing  Sitting and reading: 2 Watching TV: 2 Sitting inactive in a public place (ex. Theater or meeting): 1 As a passenger in a car for an hour without a break: 3 Lying down to rest in the afternoon: 2 Sitting and talking to someone: 0 Sitting quietly after lunch (no alcohol): 2 In a car, while stopped in traffic: 0 Total: 12     Objective:  Neurological Exam  Physical Exam Physical Examination:   Vitals:   03/28/19 1114  BP: (!) 143/89  Pulse: (!) 114    General Examination: The patient is a very pleasant 33 y.o. female in no acute distress. She appears well-developed and well-nourished and well groomed.   HEENT: Normocephalic, atraumatic, pupils are equal, round and reactive to light and accommodation. Extraocular tracking is good without limitation to gaze excursion or nystagmus noted. Normal smooth pursuit is noted. Hearing is grossly intact. Face is symmetric with normal facial animation and normal facial sensation. Speech  is clear with no dysarthria noted. There is no hypophonia. There is no lip, neck/head, jaw or  voice tremor. Neck is supple with full range of passive and active motion. There are no carotid bruits on auscultation. Oropharynx exam reveals: mild mouth dryness, adequate dental hygiene and moderate airway crowding, due to Small airway entry and redundant soft palate.  Tonsils are absent.  Mallampati is class III.  Neck circumference is 14-1/2 inches.  She has a minimal overbite.  Tongue protrudes centrally in palate elevates symmetrically.   Chest: Clear to auscultation without wheezing, rhonchi or crackles noted.  Heart: S1+S2+0, regular and normal without murmurs, rubs or gallops noted.   Abdomen: Soft, non-tender and non-distended with normal bowel sounds appreciated on auscultation.  Extremities: There is no pitting edema in the distal lower extremities bilaterally. Pedal pulses are intact.  Skin: Warm and dry without trophic changes noted.  Musculoskeletal: exam reveals no obvious joint deformities, tenderness or joint swelling or erythema.   Neurologically:  Mental status: The patient is awake, alert and oriented in all 4 spheres. Her immediate and remote memory, attention, language skills and fund of knowledge are appropriate. There is no evidence of aphasia, agnosia, apraxia or anomia. Speech is clear with normal prosody and enunciation. Thought process is linear. Mood is normal and affect is normal.  Cranial nerves II - XII are as described above under HEENT exam. In addition: shoulder shrug is normal with equal shoulder height noted. Motor exam: Normal bulk, strength and tone is noted. There is no tremor, Romberg is negative. Fine motor skills and coordination: intact with normal finger taps, normal hand movements, normal rapid alternating patting, normal foot taps and normal foot agility.  Cerebellar testing: No dysmetria or intention tremor on finger to nose testing. Heel to shin is unremarkable bilaterally. There is no truncal or gait ataxia.  Sensory exam: intact to light touch  in the upper and lower extremities.  Gait, station and balance: She stands easily. No veering to one side is noted. No leaning to one side is noted. Posture is age-appropriate and stance is narrow based. Gait shows normal stride length and normal pace. No problems turning are noted. Tandem walk is Slightly challenging for her but doable.  Assessment and Plan:  In summary, [name] [name] is a very pleasant 33 y.o.-year old female  with an underlying medical history of hypothyroidism, depression, anxiety, anemia, reflux disease, kidney stones, PCO S, and morbid obesity with a BMI of over 40, Status post weight loss surgery in 2014, whose history and physical exam are concerning for obstructive sleep apnea (OSA). I had a long chat with the patient about my findings and the diagnosis of OSA, its prognosis and treatment options. We talked about medical treatments, surgical interventions and non-pharmacological approaches. I explained in particular the risks and ramifications of untreated moderate to severe OSA, especially with respect to developing cardiovascular disease down the Road, including congestive heart failure, difficult to treat hypertension, cardiac arrhythmias, or stroke. Even type 2 diabetes has, in part, been linked to untreated OSA. Symptoms of untreated OSA include daytime sleepiness, memory problems, mood irritability and mood disorder such as depression and anxiety, lack of energy, as well as recurrent headaches, especially morning headaches. We talked about trying to maintain a healthy lifestyle in general, as well as the importance of weight control. We also talked about the importance of good sleep hygiene. I recommended the following at this time: sleep study.   I explained the sleep test  procedure to the patient and also outlined possible surgical and non-surgical treatment options of OSA, including the use of a custom-made dental device (which would require a referral to a specialist  dentist or oral surgeon), upper airway surgical options, such as traditional UPPP or a novel less invasive surgical option in the form of Inspire hypoglossal nerve stimulation (which would involve a referral to an ENT surgeon). I also explained the CPAP treatment option to the patient, who indicated that she would be willing to try CPAP if the need arises. I explained the importance of being compliant with PAP treatment, not only for insurance purposes but primarily to improve Her symptoms, and for the patient's long term health benefit, including to reduce Her cardiovascular risks. I answered all her questions today and the patient was in agreement. I plan to see her back after the sleep study is completed and encouraged her to call with any interim questions, concerns, problems or updates.   Thank you very much for allowing me to participate in the care of this nice patient. If I can be of any further assistance to you please do not hesitate to call me at [phone].  Sincerely,   [name], MD, PhD

## 2019-03-28 NOTE — Patient Instructions (Signed)

## 2019-03-29 ENCOUNTER — Institutional Professional Consult (permissible substitution): Payer: Self-pay | Admitting: Neurology

## 2019-04-02 MED FILL — lamoTRIgine 25 MG TABS: 25 | 30 days supply | Qty: 60 | Fill #1

## 2019-04-05 ENCOUNTER — Ambulatory Visit: Payer: No Typology Code available for payment source | Admitting: Psychology

## 2019-04-07 ENCOUNTER — Telehealth: Payer: No Typology Code available for payment source | Admitting: Physician Assistant

## 2019-04-07 DIAGNOSIS — M5440 Lumbago with sciatica, unspecified side: Secondary | ICD-10-CM

## 2019-04-07 MED ORDER — BACLOFEN 10 MG PO TABS: 10.0000 mg | ORAL_TABLET | Freq: Three times a day (TID) | ORAL | 0 refills | Status: DC

## 2019-04-07 MED ORDER — PREDNISONE 10 MG (21) PO TBPK: ORAL_TABLET | ORAL | 0 refills | Status: DC

## 2019-04-07 NOTE — Progress Notes (Signed)
We are sorry that you are not feeling well.  Here is how we plan to help!  Based on what you have shared with me it looks like you mostly have acute back pain.  Acute back pain is defined as musculoskeletal pain that can resolve in 1-3 weeks with conservative treatment.  I have prescribed a small prednisone taper pack.  The instructions will be included with the prescription as well as Baclofen 10 mg every eight hours as needed which is a muscle relaxer  Some patients experience stomach irritation or in increased heartburn with anti-inflammatory drugs.  Please keep in mind that muscle relaxer's can cause fatigue and should not be taken while at work or driving.  Back pain is very common.  The pain often gets better over time.  The cause of back pain is usually not dangerous.  Most people can learn to manage their back pain on their own.  Home Care  Stay active.  Start with short walks on flat ground if you can.  Try to walk farther each day.  Do not sit, drive or stand in one place for more than 30 minutes.  Do not stay in bed.  Do not avoid exercise or work.  Activity can help your back heal faster.  Be careful when you bend or lift an object.  Bend at your knees, keep the object close to you, and do not twist.  Sleep on a firm mattress.  Lie on your side, and bend your knees.  If you lie on your back, put a pillow under your knees.  Only take medicines as told by your doctor.  Put ice on the injured area.  Put ice in a plastic bag  Place a towel between your skin and the bag  Leave the ice on for 15-20 minutes, 3-4 times a day for the first 2-3 days. 210 After that, you can switch between ice and heat packs.  Ask your doctor about back exercises or massage.  Avoid feeling anxious or stressed.  Find good ways to deal with stress, such as exercise.  Get Help Right Way If:  Your pain does not go away with rest or medicine.  Your pain does not go away in 1 week.  You have new  problems.  You do not feel well.  The pain spreads into your legs.  You cannot control when you poop (bowel movement) or pee (urinate)  You feel sick to your stomach (nauseous) or throw up (vomit)  You have belly (abdominal) pain.  You feel like you may pass out (faint).  If you develop a fever.  Make Sure you:  Understand these instructions.  Will watch your condition  Will get help right away if you are not doing well or get worse.  Your e-visit answers were reviewed by a board certified advanced clinical practitioner to complete your personal care plan.  Depending on the condition, your plan could have included both over the counter or prescription medications.  If there is a problem please reply  once you have received a response from your provider.  Your safety is important to [place].  If you have drug allergies check your prescription carefully.    You can use MyChart to ask questions about today's visit, request a non-urgent call back, or ask for a work or school excuse for 24 hours related to this e-Visit. If it has been greater than 24 hours you will need to follow up with your provider, or enter  a new e-Visit to address those concerns.  You will get an e-mail in the next two days asking about your experience.  I hope that your e-visit has been valuable and will speed your recovery. Thank you for using e-visits.  Particia Nearing PA-C  Approximately 5 minutes was spent documenting and reviewing patient's chart.

## 2019-04-07 NOTE — Telephone Encounter (Signed)
All have been faxed to requested locations.

## 2019-04-19 ENCOUNTER — Ambulatory Visit: Payer: No Typology Code available for payment source | Admitting: Psychology

## 2019-04-21 ENCOUNTER — Ambulatory Visit (INDEPENDENT_AMBULATORY_CARE_PROVIDER_SITE_OTHER): Payer: No Typology Code available for payment source | Admitting: Psychology

## 2019-04-21 DIAGNOSIS — F5101 Primary insomnia: Secondary | ICD-10-CM | POA: Diagnosis not present

## 2019-04-29 ENCOUNTER — Ambulatory Visit (INDEPENDENT_AMBULATORY_CARE_PROVIDER_SITE_OTHER): Payer: No Typology Code available for payment source | Admitting: Neurology

## 2019-04-29 ENCOUNTER — Other Ambulatory Visit: Payer: Self-pay

## 2019-04-29 DIAGNOSIS — G479 Sleep disorder, unspecified: Secondary | ICD-10-CM

## 2019-04-29 DIAGNOSIS — R519 Headache, unspecified: Secondary | ICD-10-CM

## 2019-04-29 DIAGNOSIS — R0683 Snoring: Secondary | ICD-10-CM

## 2019-04-29 DIAGNOSIS — G4710 Hypersomnia, unspecified: Secondary | ICD-10-CM | POA: Diagnosis not present

## 2019-04-29 DIAGNOSIS — G4700 Insomnia, unspecified: Secondary | ICD-10-CM

## 2019-04-29 DIAGNOSIS — G4720 Circadian rhythm sleep disorder, unspecified type: Secondary | ICD-10-CM

## 2019-04-29 DIAGNOSIS — G4719 Other hypersomnia: Secondary | ICD-10-CM

## 2019-04-29 DIAGNOSIS — Z820 Family history of epilepsy and other diseases of the nervous system: Secondary | ICD-10-CM

## 2019-04-29 DIAGNOSIS — R0681 Apnea, not elsewhere classified: Secondary | ICD-10-CM

## 2019-05-02 ENCOUNTER — Telehealth: Payer: No Typology Code available for payment source | Admitting: Family

## 2019-05-02 DIAGNOSIS — R109 Unspecified abdominal pain: Secondary | ICD-10-CM

## 2019-05-02 DIAGNOSIS — R399 Unspecified symptoms and signs involving the genitourinary system: Secondary | ICD-10-CM

## 2019-05-02 MED FILL — PANTOPRAZOLE SOD DR 40 MG T: 40 | 30 days supply | Qty: 30 | Fill #2

## 2019-05-02 MED FILL — LAMOTRIGINE 25 MG TABS: 25 | 30 days supply | Qty: 60 | Fill #2

## 2019-05-02 MED FILL — ALPRAZolam 1 MG TABS: 1 | 45 days supply | Qty: 90 | Fill #1

## 2019-05-02 NOTE — Progress Notes (Signed)
Based on what you shared with me, I feel your condition warrants further evaluation and I recommend that you be seen for a face to face office visit.    Given you are having UTI symptoms with back pain, you need to be seen face to face to rule out a more serious infection.    NOTE: If you entered your credit card information for this eVisit, you will not be charged. You may see a "hold" on your card for the $35 but that hold will drop off and you will not have a charge processed.   If you are having a true medical emergency please call 911.      For an urgent face to face visit, [place] has five urgent care centers for your convenience:      NEW:  [facility] [place] Get Driving Directions [phone] [address] [place], [place] [address] . 10 am - 6pm Monday - Friday    [facility] Get Driving Directions [phone] [address] [place], [place] [address] . 10 am to 8 pm Monday-Friday . 12 pm to 8 pm Saturday-Sunday     [facility] Get Driving Directions [phone] [address], [address] [place], [place] [address] . 8 am to 8 pm Monday-Friday . 9 am to 6 pm Saturday . 11 am to 6 pm Sunday     [facility] [place] Get Driving Directions  [phone] [address] [place], [place] [address] . 8 am to 8 pm Monday-Friday . 8 am to 4 pm Saturday-Sunday    [facility] at [place] Get Driving Directions [phone] [address] [place],  [address] . 12 pm to 6 pm Monday-Friday      Your e-visit answers were reviewed by a board certified advanced clinical practitioner to complete your personal care plan.  Thank you for using e-Visits.     

## 2019-05-11 NOTE — Procedures (Signed)
PATIENT'S NAME:  [name] [name], [name] [name] DOB:      [birth year]      MR#:    [id number]     DATE OF RECORDING: 04/29/2019 REFERRING M.D.:  Helane Rima, DO Study Performed:   Baseline Polysomnogram HISTORY: 33 year old woman with a history of hypothyroidism, depression, anxiety, anemia, reflux disease, kidney stones, PCOS, and morbid obesity with a BMI of over 40, s/p weight loss surgery in 2014, who reports snoring and difficulty initiating sleep. The patient endorsed the Epworth Sleepiness Scale at 12 points. The patient's weight 259 pounds with a height of 64 (inches), resulting in a BMI of 44. kg/m2. The patient's neck circumference measured 14.5 inches.  CURRENT MEDICATIONS: Tylenol, Xanax, Calcium-Mag-Zinc, Voltaren, Lexapro, Neurontin, Lamictal, Linzess, Zofran, Protonix, Rozerem, Zanaflex, Valtrex, Vit D   PROCEDURE:  This is a multichannel digital polysomnogram utilizing the Somnostar 11.2 system.  Electrodes and sensors were applied and monitored per AASM Specifications.   EEG, EOG, Chin and Limb EMG, were sampled at 200 Hz.  ECG, Snore and Nasal Pressure, Thermal Airflow, Respiratory Effort, CPAP Flow and Pressure, Oximetry was sampled at 50 Hz. Digital video and audio were recorded.      BASELINE STUDY  Lights Out was at 22:33 and Lights On at 05:01.  Total recording time (TRT) was 388.5 minutes, with a total sleep time (TST) of 331 minutes.   The patient's sleep latency was 0.5 minutes. REM latency was 154.5 minutes, which is delayed. The sleep efficiency was 85.2 %.     SLEEP ARCHITECTURE: WASO (Wake after sleep onset) was 56 minutes with mild to moderate sleep fragmentation noted. There were 15 minutes in Stage N1, 169.5 minutes Stage N2, 139.5 minutes Stage N3 and 7 minutes in Stage REM.  The percentage of Stage N1 was 4.5%, Stage N2 was 51.2%, Stage N3 was 42.1% which is increased, and Stage R (REM sleep) was 2.1%, which is markedly reduced.  RESPIRATORY ANALYSIS:  There were a total of  4 respiratory events:  0 obstructive apneas, 0 central apneas and 0 mixed apneas with a total of 0 apneas and an apnea index (AI) of 0 /hour. There were 4 hypopneas with a hypopnea index of .7 /hour. The patient also had 0 respiratory event related arousals (RERAs).       The total APNEA/HYPOPNEA INDEX (AHI) was .7 /hour and the total RESPIRATORY DISTURBANCE INDEX was 0. .7 /hour.  1 events occurred in REM sleep and 6 events in NREM. The REM AHI was 8.6 /hour, versus a non-REM AHI of .6. The patient spent 322.5 minutes of total sleep time in the supine position and 9 minutes in non-supine.. The supine AHI was 0.7 versus a non-supine AHI of 0.0.  OXYGEN SATURATION & C02:  The Wake baseline 02 saturation was 98%, with the lowest being 87%. Time spent below 89% saturation equaled 1 minutes.  PERIODIC LIMB MOVEMENTS: The patient had a total of 0 Periodic Limb Movements.  The Periodic Limb Movement (PLM) index was 0 and the PLM Arousal index was 0/hour. The arousals were noted as: 21 were spontaneous, 0 were associated with PLMs, 0 were associated with respiratory events.  Audio and video analysis did not show any abnormal or unusual movements, behaviors, phonations or vocalizations. The patient took 1 bathroom break. No significant snoring was noted. The EKG was in keeping with normal sinus rhythm (NSR).  Post-study, the patient indicated that sleep was better than usual.   IMPRESSION:  1. Dysfunctions associated with sleep stages or arousal  from sleep  RECOMMENDATIONS:  1. This study does not demonstrate any significant obstructive or central sleep disordered breathing with the exception of mild REM related OSA. For this, CPAP or autoPAP therapy are not warranted; ongoing weight loss and avoidance of the supine sleep position are recommended.  2. This study shows sleep fragmentation and abnormal sleep stage percentages; these are nonspecific findings and per se do not signify an intrinsic sleep  disorder or a cause for the patient's sleep-related symptoms. Causes include (but are not limited to) the first night effect of the sleep study, circadian rhythm disturbances, medication effect or an underlying mood disorder or medical problem.  3. The patient should be cautioned not to drive, work at heights, or operate dangerous or heavy equipment when tired or sleepy. Review and reiteration of good sleep hygiene measures should be pursued with any patient. 4. The patient will be advised to follow up with the referring provider, who will be notified of the test results.  I certify that I have reviewed the entire raw data recording prior to the issuance of this report in accordance with the Standards of Accreditation of the American Academy of Sleep Medicine (AASM)   Star Age, MD, PhD Diplomat, American Board of Neurology and Sleep Medicine (Neurology and Sleep Medicine)

## 2019-05-11 NOTE — Progress Notes (Signed)
Patient referred by Dr. Juleen China, seen by me on 03/28/19, diagnostic PSG on 04/29/19.   Please call and notify the patient that the recent sleep study did not show any significant obstructive sleep apnea with the exception of mild REM related OSA. For this, CPAP or autoPAP therapy are not warranted; ongoing weight loss and avoidance of the supine sleep position are recommended.  Please remind patient to try to maintain good sleep hygiene, which means: Keep a regular sleep and wake schedule and make enough time for sleep (7 1/2 to 8 1/2 hours for the average adult), try not to exercise or have a meal within 2 hours of your bedtime, try to keep your bedroom conducive for sleep, that is, cool and dark, without light distractors such as an illuminated alarm clock, and refrain from watching TV right before sleep or in the middle of the night and do not keep the TV or radio on during the night. If a nightlight is used, have it away from the visual field. Also, try not to use or play on electronic devices at bedtime, such as your cell phone, tablet PC or laptop. If you like to read at bedtime on an electronic device, try to dim the background light as much as possible. Do not eat in the middle of the night. Keep pets away from the bedroom environment. For stress relief, try meditation, deep breathing exercises (there are many books and CDs available), a white noise machine or fan can help to diffuse other noise distractors, such as traffic noise. Do not drink alcohol before bedtime, as it can disturb sleep and cause middle of the night awakenings. Never mix alcohol and sedating medications! Avoid narcotic pain medication close to bedtime, as opioids/narcotics can suppress breathing drive and breathing effort.   She can FU with her referring provider at this point.   Thanks,  Star Age, MD, PhD [facility])

## 2019-05-12 ENCOUNTER — Emergency Department (HOSPITAL_BASED_OUTPATIENT_CLINIC_OR_DEPARTMENT_OTHER)
Admission: EM | Admit: 2019-05-12 | Discharge: 2019-05-13 | Disposition: A | Discharge status: Home / Self Care | Payer: No Typology Code available for payment source | Attending: Emergency Medicine | Admitting: Emergency Medicine

## 2019-05-12 ENCOUNTER — Encounter (HOSPITAL_BASED_OUTPATIENT_CLINIC_OR_DEPARTMENT_OTHER): Payer: Self-pay

## 2019-05-12 ENCOUNTER — Other Ambulatory Visit: Payer: Self-pay

## 2019-05-12 DIAGNOSIS — Z886 Allergy status to analgesic agent status: Secondary | ICD-10-CM | POA: Insufficient documentation

## 2019-05-12 DIAGNOSIS — M79605 Pain in left leg: Secondary | ICD-10-CM | POA: Diagnosis not present

## 2019-05-12 DIAGNOSIS — R52 Pain, unspecified: Secondary | ICD-10-CM

## 2019-05-12 DIAGNOSIS — Z888 Allergy status to other drugs, medicaments and biological substances status: Secondary | ICD-10-CM | POA: Diagnosis not present

## 2019-05-12 DIAGNOSIS — M79604 Pain in right leg: Secondary | ICD-10-CM | POA: Diagnosis not present

## 2019-05-12 DIAGNOSIS — R2243 Localized swelling, mass and lump, lower limb, bilateral: Secondary | ICD-10-CM | POA: Diagnosis present

## 2019-05-12 DIAGNOSIS — E039 Hypothyroidism, unspecified: Secondary | ICD-10-CM | POA: Insufficient documentation

## 2019-05-12 DIAGNOSIS — Z79899 Other long term (current) drug therapy: Secondary | ICD-10-CM | POA: Diagnosis not present

## 2019-05-12 LAB — CBC WITH DIFFERENTIAL/PLATELET
Abs Immature Granulocytes: 0.05 10*3/uL (ref 0.00–0.07)
Basophils Absolute: 0.1 10*3/uL (ref 0.0–0.1)
Basophils Relative: 0 %
Eosinophils Absolute: 0.2 10*3/uL (ref 0.0–0.5)
Eosinophils Relative: 2 %
HCT: 36.4 % (ref 36.0–46.0)
Hemoglobin: 11.3 g/dL — ABNORMAL LOW (ref 12.0–15.0)
Immature Granulocytes: 0 %
Lymphocytes Relative: 27 %
Lymphs Abs: 3.4 10*3/uL (ref 0.7–4.0)
MCH: 26.2 pg (ref 26.0–34.0)
MCHC: 31 g/dL (ref 30.0–36.0)
MCV: 84.5 fL (ref 80.0–100.0)
Monocytes Absolute: 1.2 10*3/uL — ABNORMAL HIGH (ref 0.1–1.0)
Monocytes Relative: 10 %
Neutro Abs: 7.5 10*3/uL (ref 1.7–7.7)
Neutrophils Relative %: 61 %
Platelets: 359 10*3/uL (ref 150–400)
RBC: 4.31 MIL/uL (ref 3.87–5.11)
RDW: 15.1 % (ref 11.5–15.5)
WBC: 12.3 10*3/uL — ABNORMAL HIGH (ref 4.0–10.5)
nRBC: 0 % (ref 0.0–0.2)

## 2019-05-12 LAB — PREGNANCY, URINE: Preg Test, Ur: NEGATIVE

## 2019-05-12 MED ORDER — KETOROLAC TROMETHAMINE 60 MG/2ML IM SOLN
30.0000 mg | Freq: Once | INTRAMUSCULAR | Status: AC
  Administered 2019-05-12: 30 mg via INTRAMUSCULAR
  Filled 2019-05-12: qty 2

## 2019-05-12 NOTE — ED Triage Notes (Signed)
Pt presents with bilat leg swelling and left thigh bruising- pt concerned for DVT. Pt had gall bladder removed in September. Pt denies SOB/Chest pain. Pt denies injury.

## 2019-05-13 ENCOUNTER — Emergency Department (HOSPITAL_BASED_OUTPATIENT_CLINIC_OR_DEPARTMENT_OTHER)
Admission: EM | Admit: 2019-05-13 | Discharge: 2019-05-13 | Disposition: A | Discharge status: Home / Self Care | Payer: No Typology Code available for payment source | Source: Home / Self Care | Attending: Emergency Medicine | Admitting: Emergency Medicine

## 2019-05-13 ENCOUNTER — Encounter (HOSPITAL_BASED_OUTPATIENT_CLINIC_OR_DEPARTMENT_OTHER): Payer: Self-pay | Admitting: Emergency Medicine

## 2019-05-13 ENCOUNTER — Telehealth: Payer: Self-pay | Admitting: Family Medicine

## 2019-05-13 ENCOUNTER — Emergency Department (HOSPITAL_BASED_OUTPATIENT_CLINIC_OR_DEPARTMENT_OTHER): Payer: No Typology Code available for payment source

## 2019-05-13 ENCOUNTER — Ambulatory Visit (HOSPITAL_BASED_OUTPATIENT_CLINIC_OR_DEPARTMENT_OTHER)
Admission: RE | Admit: 2019-05-13 | Discharge: 2019-05-13 | Disposition: A | Discharge status: Home / Self Care | Payer: No Typology Code available for payment source | Source: Ambulatory Visit | Attending: Emergency Medicine | Admitting: Emergency Medicine

## 2019-05-13 ENCOUNTER — Encounter: Payer: Self-pay | Admitting: Family Medicine

## 2019-05-13 ENCOUNTER — Encounter (HOSPITAL_BASED_OUTPATIENT_CLINIC_OR_DEPARTMENT_OTHER): Payer: Self-pay

## 2019-05-13 DIAGNOSIS — R52 Pain, unspecified: Secondary | ICD-10-CM

## 2019-05-13 DIAGNOSIS — M79604 Pain in right leg: Secondary | ICD-10-CM | POA: Insufficient documentation

## 2019-05-13 DIAGNOSIS — Z79899 Other long term (current) drug therapy: Secondary | ICD-10-CM | POA: Insufficient documentation

## 2019-05-13 DIAGNOSIS — M79605 Pain in left leg: Secondary | ICD-10-CM | POA: Insufficient documentation

## 2019-05-13 DIAGNOSIS — F419 Anxiety disorder, unspecified: Secondary | ICD-10-CM | POA: Insufficient documentation

## 2019-05-13 LAB — BASIC METABOLIC PANEL
Anion gap: 6 (ref 5–15)
BUN: 13 mg/dL (ref 6–20)
CO2: 22 mmol/L (ref 22–32)
Calcium: 9 mg/dL (ref 8.9–10.3)
Chloride: 106 mmol/L (ref 98–111)
Creatinine, Ser: 0.63 mg/dL (ref 0.44–1.00)
GFR calc Af Amer: >60 mL/min (ref >60)
GFR calc non Af Amer: >60 mL/min (ref >60)
Glucose, Bld: 67 mg/dL — ABNORMAL LOW (ref 70–99)
Potassium: 3.5 mmol/L (ref 3.5–5.1)
Sodium: 134 mmol/L — ABNORMAL LOW (ref 135–145)

## 2019-05-13 IMAGING — US US EXTREM LOW VENOUS
1 series · 13 of 24 positions shown · non-contrast
Comparison: None.

CLINICAL DATA: Bilateral lower extremity cramping and weakness.



[Series 1: us extrem low venous · 13 of 58 slices shown]
[im 1/58]
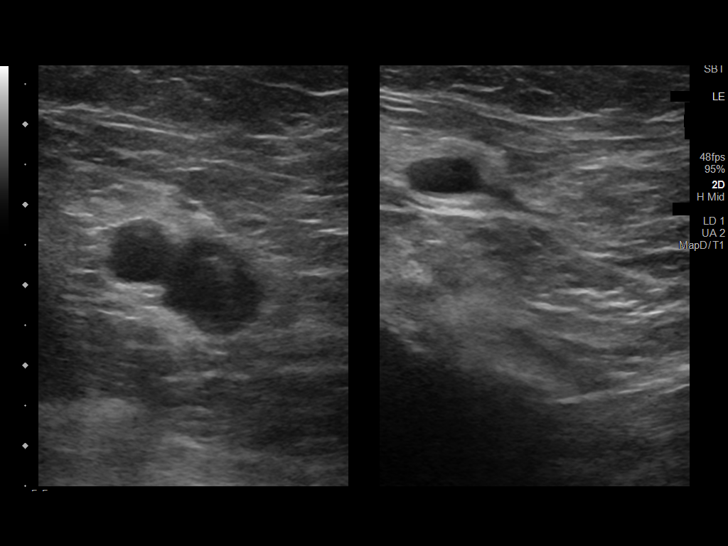
[im 5/58]
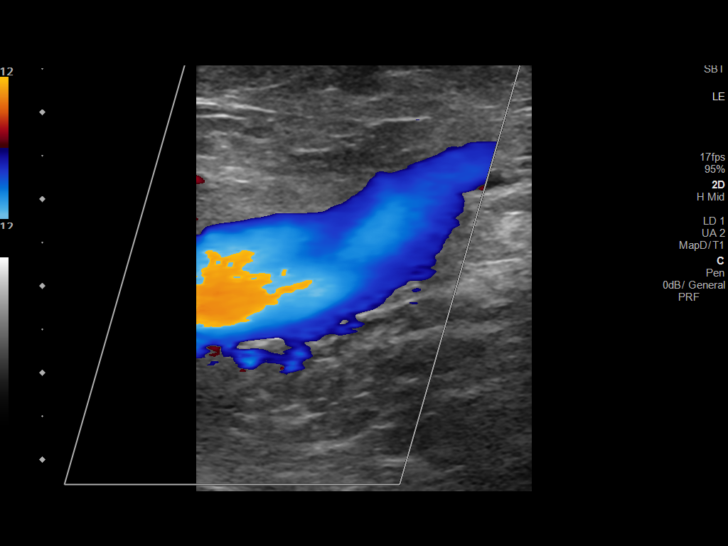
[im 10/58]
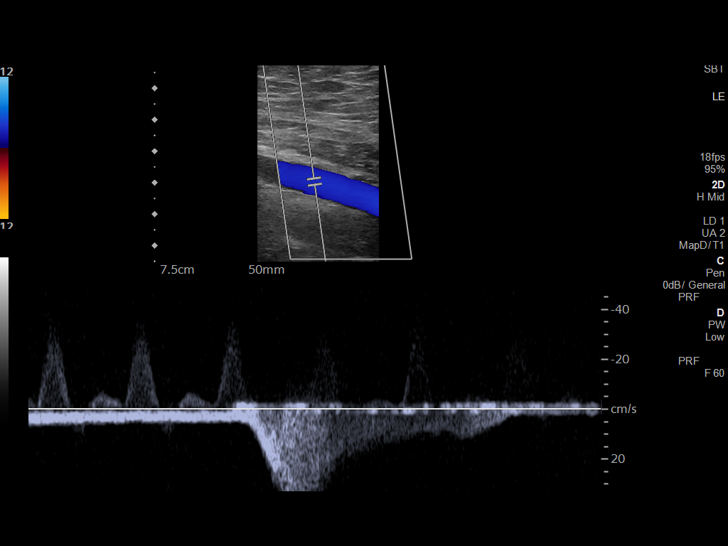
[im 15/58]
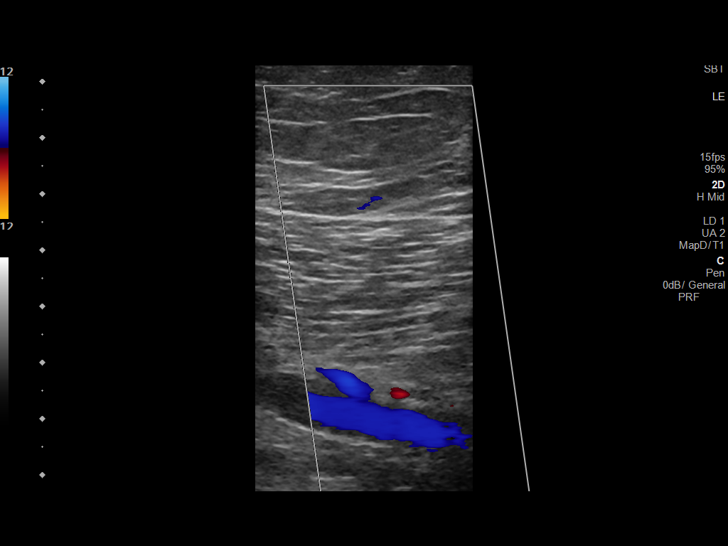
[im 20/58]
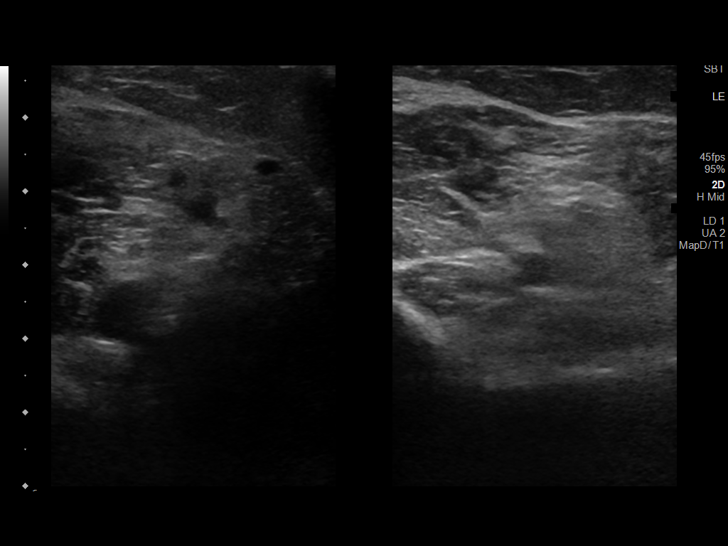
[im 25/58]
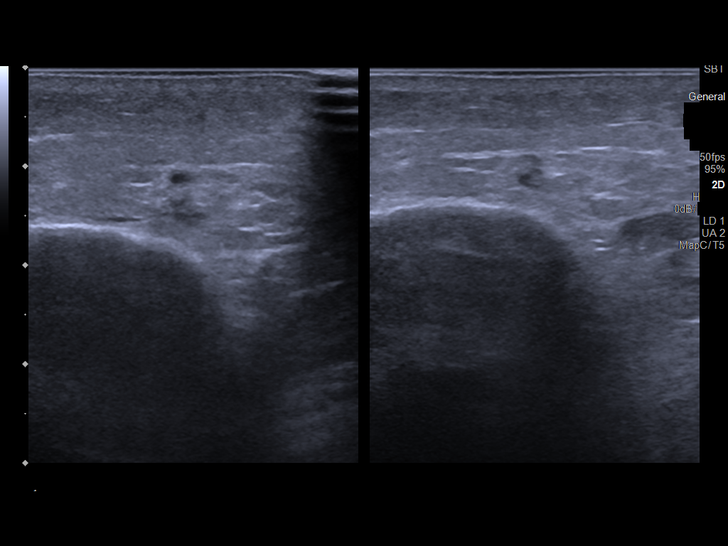
[im 30/58]
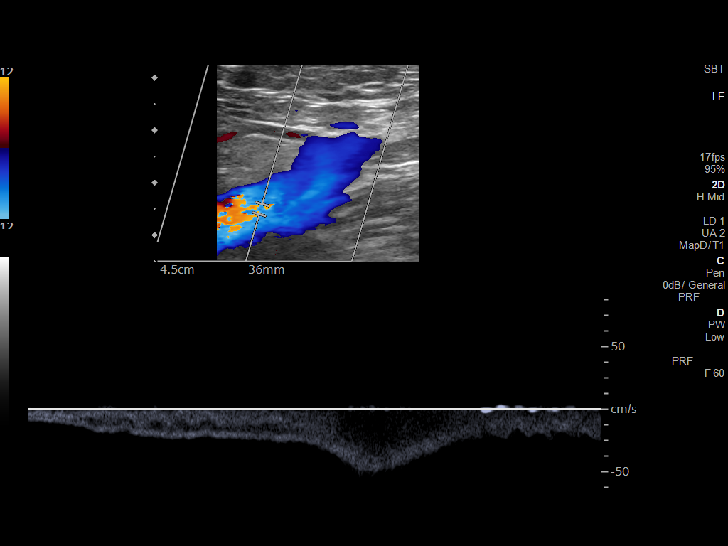
[im 33/58]
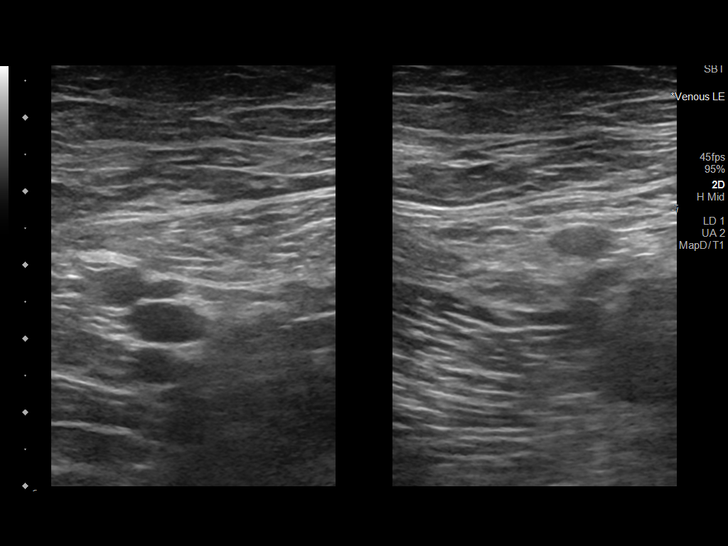
[im 38/58]
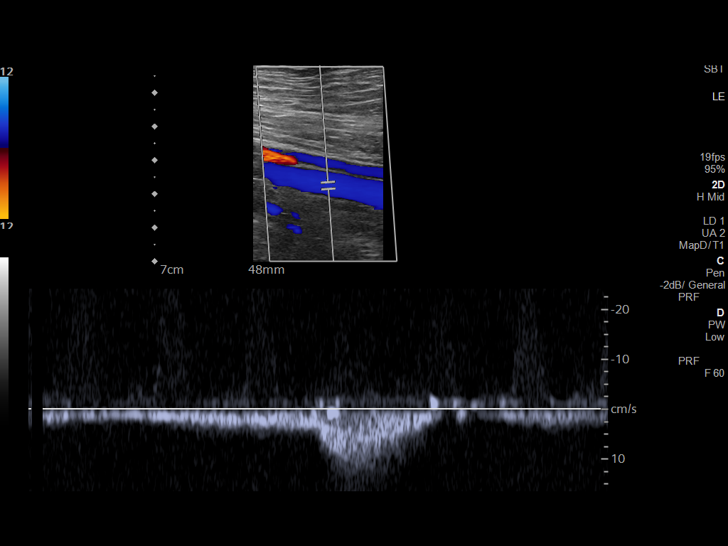
[im 43/58]
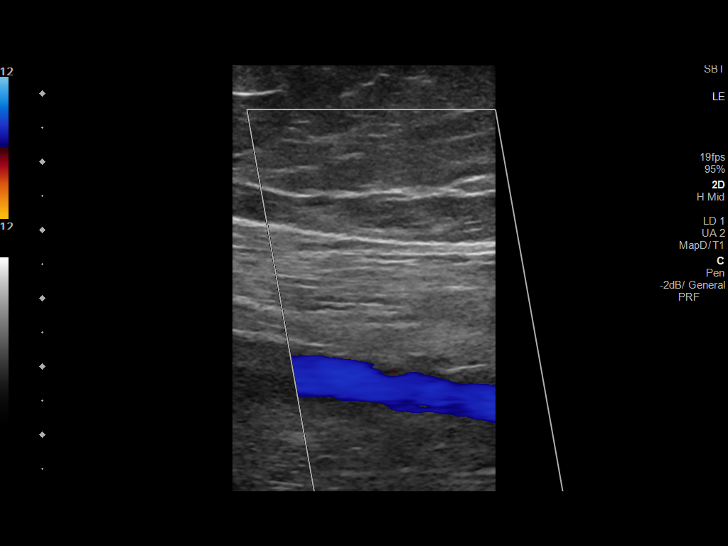
[im 48/58]
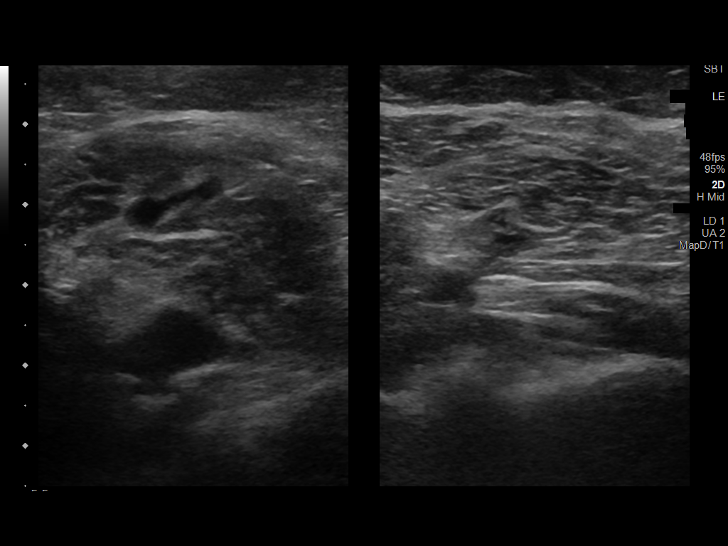
[im 53/58]
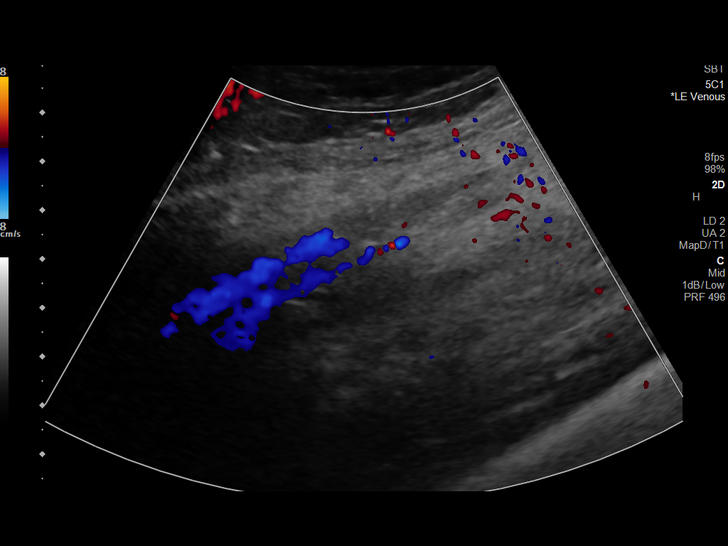
[im 58/58]
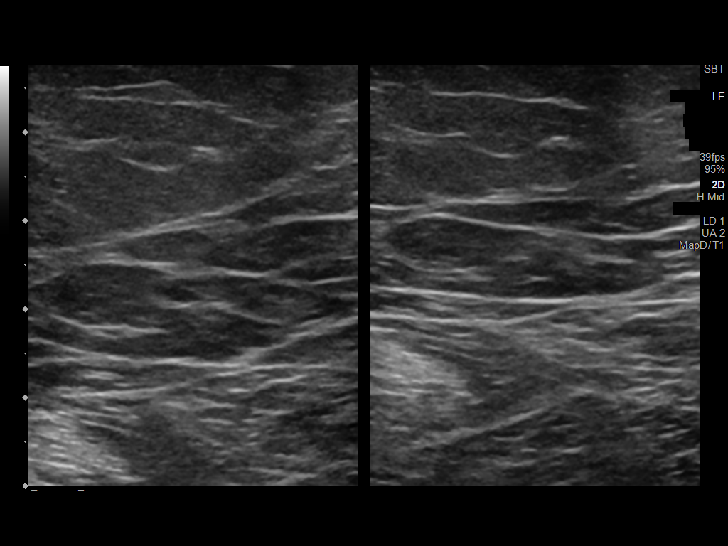

[13 of 24 positions shown; findings below may reference images not displayed]

FINDINGS: RIGHT LOWER EXTREMITY

Common Femoral Vein: No evidence of thrombus. Normal
compressibility, respiratory phasicity and response to augmentation.

Saphenofemoral Junction: No evidence of thrombus. Normal
compressibility and flow on color Doppler imaging.

Profunda Femoral Vein: No evidence of thrombus. Normal
compressibility and flow on color Doppler imaging.

Femoral Vein: No evidence of thrombus. Normal compressibility,
respiratory phasicity and response to augmentation.

Popliteal Vein: No evidence of thrombus. Normal compressibility,
respiratory phasicity and response to augmentation.

Calf Veins: No evidence of thrombus. Normal compressibility and flow
on color Doppler imaging.

Superficial Great Saphenous Vein: No evidence of thrombus. Normal
compressibility.

Venous Reflux:  None.

Other Findings:  None.

LEFT LOWER EXTREMITY

Common Femoral Vein: No evidence of thrombus. Normal
compressibility, respiratory phasicity and response to augmentation.

Saphenofemoral Junction: No evidence of thrombus. Normal
compressibility and flow on color Doppler imaging.

Profunda Femoral Vein: No evidence of thrombus. Normal
compressibility and flow on color Doppler imaging.

Femoral Vein: No evidence of thrombus. Normal compressibility,
respiratory phasicity and response to augmentation.

Popliteal Vein: No evidence of thrombus. Normal compressibility,
respiratory phasicity and response to augmentation.

Calf Veins: No evidence of thrombus. Normal compressibility and flow
on color Doppler imaging.

Superficial Great Saphenous Vein: No evidence of thrombus. Normal
compressibility.

Venous Reflux:  None.

Other Findings:  None.
IMPRESSION: No evidence of deep venous thrombosis in either lower extremity.

## 2019-05-13 MED ORDER — GABAPENTIN 300 MG PO CAPS
300.0000 mg | ORAL_CAPSULE | ORAL | Status: AC
  Administered 2019-05-13: 01:00:00 300 mg via ORAL
  Filled 2019-05-13: qty 1

## 2019-05-13 MED ORDER — HYDROCODONE-ACETAMINOPHEN 7.5-325 MG PO TABS
1.0000 | ORAL_TABLET | Freq: Once | ORAL | Status: DC
  Filled 2019-05-13: qty 1

## 2019-05-13 MED ORDER — DICLOFENAC SODIUM 1 % EX GEL: 4.0000 g | Freq: Four times a day (QID) | CUTANEOUS | 0 refills | Status: DC

## 2019-05-13 MED ORDER — GABAPENTIN 100 MG PO CAPS: 100.0000 mg | ORAL_CAPSULE | Freq: Two times a day (BID) | ORAL | 0 refills | Status: DC

## 2019-05-13 MED ORDER — HYDROCODONE-ACETAMINOPHEN 5-325 MG PO TABS
ORAL_TABLET | ORAL | Status: AC
  Filled 2019-05-13: qty 1

## 2019-05-13 MED ORDER — ENOXAPARIN SODIUM 120 MG/0.8ML ~~LOC~~ SOLN
1.0000 mg/kg | Freq: Once | SUBCUTANEOUS | Status: AC
  Administered 2019-05-13: 115 mg via SUBCUTANEOUS
  Filled 2019-05-13: qty 0.8

## 2019-05-13 MED ORDER — HYDROCODONE-ACETAMINOPHEN 5-325 MG PO TABS
1.0000 | ORAL_TABLET | Freq: Once | ORAL | Status: AC
  Administered 2019-05-13: 1 via ORAL

## 2019-05-13 NOTE — ED Triage Notes (Signed)
Pt reports seen here last night with B/L leg pain. Pt reports home pain medications ineffective. Pt reports that she fell going to the bathroom today. Pt has ultrasound scheduled to day but will be a 45 min to 1 hour wait.

## 2019-05-13 NOTE — Discharge Instructions (Addendum)
Your ultrasound today was negative.  I have provided a copy of these results for your records.  You were provided with medication while in the ED, we discussed why I cannot send you home with a prescription for pain medication.  Please follow-up with your primary care physician and orthopedist as needed.

## 2019-05-13 NOTE — ED Notes (Signed)
Pt transported to US

## 2019-05-13 NOTE — Telephone Encounter (Signed)
Patient is calling to see if she can get a hospital follow up for both legs pain. Patient has been to ED- No available appt until 05/23/2019. Patient is available for an appt Monday afternoon or on Tuesday. Please advise. CB- [phone]

## 2019-05-13 NOTE — Telephone Encounter (Signed)
Have called patient and made app. Message sent to admin to call Monday with phone number for check in

## 2019-05-13 NOTE — ED Notes (Signed)
Outpatient U/S scheduled by radiology from 11/27 at 1300.

## 2019-05-13 NOTE — ED Provider Notes (Signed)
[facility] EMERGENCY DEPARTMENT Provider Note   CSN: [id number] Arrival date & time: 05/12/19  2317     History   Chief Complaint Chief Complaint  Patient presents with  . Leg Swelling    HPI ELVERTA DIMICELI is a 33 y.o. female.     The history is provided by the patient.  Leg Pain Location:  Leg Time since incident:  10 days Injury: no   Leg location:  L leg and R leg Pain details:    Quality:  Aching   Radiates to:  Does not radiate   Severity:  Severe   Onset quality:  Gradual   Duration:  10 days   Timing:  Constant   Progression:  Worsening Chronicity:  New Dislocation: no   Foreign body present:  No foreign bodies Prior injury to area:  No Relieved by:  Nothing Worsened by:  Nothing Ineffective treatments:  Acetaminophen (neurontin.  ) Associated symptoms: swelling   Associated symptoms: no back pain, no decreased ROM, no fatigue, no fever, no itching, no muscle weakness, no neck pain, no numbness, no stiffness and no tingling   Associated symptoms comment:  Also reports a bruise for several days on he left lateral thigh Risk factors: no concern for non-accidental trauma     Past Medical History:  Diagnosis Date  . Anemia   . Anxiety   . DEPRESSION   . GERD (gastroesophageal reflux disease)   . Hypothyroidism    Hx of, normalized TSH during pregnancy 2011  . Infertility, female   . Migraines   . Morbid obesity (HCC)    s/p RY 08/2012 - start weight 290 pounds  . Nephrolithiasis   . PCOS (polycystic ovarian syndrome)   . PONV (postoperative nausea and vomiting)   . Pregnancy induced hypertension     Patient Active Problem List   Diagnosis Date Noted  . Cold sore 03/13/2019  . Insomnia due to other mental disorder 03/13/2019  . Severe episode of recurrent major depressive disorder, without psychotic features (HCC) 03/13/2019  . Biliary dyskinesia 02/14/2019  . RUQ abdominal pain 02/14/2019  . Elevated liver enzymes 02/14/2019   . Pain in right hand 12/02/2018  . Abnormal serum level of alkaline phosphatase 11/21/2018  . Pain in both lower extremities 11/21/2018  . Nocturnal leg cramps 10/26/2018  . Tachycardia 10/26/2018  . Bilateral lower extremity edema 10/26/2018  . Low serum ferritin level 10/26/2018  . Abdominal pain 08/17/2018  . Nephrolithiasis   . Bleeding diathesis (HCC) 10/13/2017  . Depressive disorder 07/09/2017  . Dysmenorrhea 07/09/2017  . Menorrhagia 07/09/2017  . Reduced libido, due to Lexapro 07/09/2017  . History of cesarean section 07/09/2017  . Binge eating disorder 04/23/2017  . Morbid obesity (HCC) 04/23/2017  . 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 Pes planus 07/16/2016  . Hallux varus, acquired, right 03/18/2016  . Iron deficiency anemia 02/22/2015  . 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 Anxiety 04/10/2010    Past Surgical History:  Procedure Laterality Date  . Quintella Reichert OSTEOTOMY Left 06/24/2017   Procedure: Ralene Bathe;  Surgeon: [name], DPM;  Location: [facility];  Service: Podiatry;  Laterality: Left;  . BUNIONECTOMY Left 06/24/2017   Procedure: Anthoney Harada;  Surgeon: [name], DPM;  Location: [facility];  Service: Podiatry;  Laterality: Left;  . CESAREAN SECTION     x 2  . CHOLECYSTECTOMY N/A 02/15/2019   Procedure: LAPAROSCOPIC CHOLECYSTECTOMY WITH INTRAOPERATIVE CHOLANGIOGRAM;  Surgeon: Andria MeuseWhite, Christopher M, MD;  Location: [facility];  Service: General;  Laterality: N/A;  . FOOT SURGERY    . 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   Procedure: LAPAROSCOPIC ROUX-EN-Y GASTRIC;  Surgeon: Atilano InaEric M Wilson, MD;  Location: [facility];  Service: General;  Laterality: N/A;  laparoscopic roux-en-y gastric bypass  . LITHOTRIPSY Left   . TONSILLECTOMY    . TUBAL LIGATION    . UPPER GI ENDOSCOPY  08/24/2012   Procedure: UPPER GI ENDOSCOPY;  Surgeon: Atilano InaEric M Wilson, MD;  Location: [facility];  Service: General;;  . WISDOM TOOTH  EXTRACTION       OB History    Gravida  2   Para  2   Term  2   Preterm  0   AB  0   Living  2     SAB  0   TAB  0   Ectopic  0   Multiple  0   Live Births  2            Home Medications    Prior to Admission medications   Medication Sig Start Date End Date Taking? Authorizing Provider  acetaminophen (TYLENOL) 500 MG tablet Take 2 tablets (1,000 mg total) by mouth every 6 (six) hours as needed for moderate pain. 02/13/19   Fayrene Helperran, Bowie, PA-C  ALPRAZolam Prudy Feeler(XANAX) 1 MG tablet Take 1-2 tablets (1-2 mg total) by mouth 2 (two) times daily as needed for anxiety. 1-2 tab QHS Patient taking differently: Take 1-2 mg by mouth at bedtime as needed for anxiety. 1-2 tab QHS 03/11/19   Helane RimaWallace, Erica, DO  baclofen (LIORESAL) 10 MG tablet Take 1 tablet (10 mg total) by mouth 3 (three) times daily. 04/07/19   Remus LofflerJones, Angel S, PA-C  CALCIUM-MAGNESIUM-ZINC PO Take 3 tablets by mouth daily.    [provider]  diclofenac sodium (VOLTAREN) 1 % GEL Apply topically to affected area qid 12/29/17   Andrena Mewsigby, [name], DO  escitalopram (LEXAPRO) 20 MG tablet Take 1 tablet (20 mg total) by mouth daily. 03/11/19   Helane RimaWallace, Erica, DO  gabapentin (NEURONTIN) 100 MG capsule 100mg  in the morning and 200 mg at night 03/11/19   Helane RimaWallace, Erica, DO  lamoTRIgine (LAMICTAL) 25 MG tablet Take 50 mg by mouth daily.    [provider]  linaclotide (LINZESS) 72 MCG capsule Take 1 capsule (72 mcg total) by mouth daily. 07/06/18   Rachael FeeJacobs, Daniel P, MD  Menaquinone-7 (VITAMIN K2 PO) Take 1 capsule by mouth daily.     [provider]  Multiple Vitamins-Iron (ONE-TABLET-DAILY/IRON PO) Take 1 tablet by mouth daily.    [provider]  ondansetron (ZOFRAN-ODT) 4 MG disintegrating tablet Take 1 tablet (4 mg total) by mouth every 6 (six) hours as needed for nausea. 02/16/19   Maczis, Elmer SowMichael M, PA-C  pantoprazole (PROTONIX) 40 MG tablet Take 1 tablet (40 mg total) by mouth daily. 01/25/19    Esterwood, Amy S, PA-C  predniSONE (STERAPRED UNI-PAK 21 TAB) 10 MG (21) TBPK tablet As directed x 6 days 04/07/19   Remus LofflerJones, Angel S, PA-C  ramelteon (ROZEREM) 8 MG tablet Take 1 tablet (8 mg total) by mouth at bedtime. 03/11/19   Helane RimaWallace, Erica, DO  tizanidine (ZANAFLEX) 2 MG capsule Take 2 capsules (4 mg total) by mouth 3 (three) times daily. 03/11/19   Helane RimaWallace, Erica, DO  valACYclovir (VALTREX) 500 MG tablet 2 po in am and 2 po in pm x 1 day. 03/11/19   Helane RimaWallace, Erica, DO  Vitamin D, Ergocalciferol, (DRISDOL) 1.25 MG (50000 UT) CAPS capsule Take 50,000 Units by mouth every Monday.    [provider]    Family History Family History  Problem Relation Age of Onset  . Hyperlipidemia Father   . Hypertension Father   . Kidney disease Father   . Colon cancer Father 61  . Hypertension Mother   . Hypertension Maternal Grandmother   . Uterine cancer Maternal Grandmother 56  . Diabetes Maternal Grandfather     Social History Social History   Tobacco Use  . Smoking status: Never Smoker  . Smokeless tobacco: Never Used  Substance Use Topics  . Alcohol use: No    Comment: rare/socially  . Drug use: No     Allergies   Ambien [zolpidem] and Nsaids   Review of Systems Review of Systems  Constitutional: Negative for fatigue and fever.  HENT: Negative for congestion.   Eyes: Negative for visual disturbance.  Respiratory: Negative for cough, choking, chest tightness and shortness of breath.   Cardiovascular: Negative for chest pain and palpitations.  Gastrointestinal: Negative for abdominal pain.  Endocrine: Negative for polyuria.  Genitourinary: Negative for difficulty urinating.  Musculoskeletal: Negative for back pain, neck pain and stiffness.  Skin: Negative for itching.  Neurological: Negative for dizziness.  Psychiatric/Behavioral: Negative for agitation.  All other systems reviewed and are negative.    Physical Exam Updated Vital Signs BP 107/61   Pulse 95    Temp 98.7 F (37.1 C) (Oral)   Resp 17   Ht  (1.626 m)   Wt 116.6 kg   SpO2 99%   BMI 44.11 kg/m   Physical Exam Vitals signs and nursing note reviewed.  Constitutional:      General: She is not in acute distress.    Appearance: Normal appearance.  HENT:     Head: Normocephalic and atraumatic.     Nose: Nose normal.  Eyes:     Conjunctiva/sclera: Conjunctivae normal.     Pupils: Pupils are equal, round, and reactive to light.  Neck:     Musculoskeletal: Normal range of motion and neck supple.  Cardiovascular:     Rate and Rhythm: Normal rate and regular rhythm.     Pulses: Normal pulses.     Heart sounds: Normal heart sounds.  Pulmonary:     Effort: Pulmonary effort is normal.     Breath sounds: Normal breath sounds.  Abdominal:     General: Abdomen is flat. Bowel sounds are normal.     Tenderness: There is no abdominal tenderness. There is no guarding or rebound.  Musculoskeletal: Normal range of motion.        General: No swelling, tenderness or deformity.     Right hip: Normal.     Left hip: Normal.     Right knee: Normal.     Left knee: Normal.     Right ankle: Normal. Achilles tendon normal.     Left ankle: Normal. Achilles tendon normal.     Right upper leg: Normal.     Left upper leg: Normal.     Right lower leg: Normal. No edema.     Left lower leg: Normal. No edema.       Legs:     Right foot: Normal.     Left foot: Normal.  Skin:    General: Skin is warm and dry.     Capillary Refill: Capillary refill takes less than 2 seconds.  Neurological:     General: No  focal deficit present.     Mental Status: She is alert and oriented to person, place, and time.  Psychiatric:        Mood and Affect: Mood normal.        Behavior: Behavior normal.      ED Treatments / Results  Labs (all labs ordered are listed, but only abnormal results are displayed) Results for orders placed or performed during the hospital encounter of 05/12/19  CBC with  Differential  Result Value Ref Range   WBC 12.3 (H) 4.0 - 10.5 K/uL   RBC 4.31 3.87 - 5.11 MIL/uL   Hemoglobin 11.3 (L) 12.0 - 15.0 g/dL   HCT 36.4 36.0 - 46.0 %   MCV 84.5 80.0 - 100.0 fL   MCH 26.2 26.0 - 34.0 pg   MCHC 31.0 30.0 - 36.0 g/dL   RDW 15.1 11.5 - 15.5 %   Platelets 359 150 - 400 K/uL   nRBC 0.0 0.0 - 0.2 %   Neutrophils Relative % 61 %   Neutro Abs 7.5 1.7 - 7.7 K/uL   Lymphocytes Relative 27 %   Lymphs Abs 3.4 0.7 - 4.0 K/uL   Monocytes Relative 10 %   Monocytes Absolute 1.2 (H) 0.1 - 1.0 K/uL   Eosinophils Relative 2 %   Eosinophils Absolute 0.2 0.0 - 0.5 K/uL   Basophils Relative 0 %   Basophils Absolute 0.1 0.0 - 0.1 K/uL   Immature Granulocytes 0 %   Abs Immature Granulocytes 0.05 0.00 - 0.07 K/uL  Basic metabolic panel  Result Value Ref Range   Sodium 134 (L) 135 - 145 mmol/L   Potassium 3.5 3.5 - 5.1 mmol/L   Chloride 106 98 - 111 mmol/L   CO2 22 22 - 32 mmol/L   Glucose, Bld 67 (L) 70 - 99 mg/dL   BUN 13 6 - 20 mg/dL   Creatinine, Ser 0.63 0.44 - 1.00 mg/dL   Calcium 9.0 8.9 - 10.3 mg/dL   GFR calc non Af Amer >60 >60 mL/min   GFR calc Af Amer >60 >60 mL/min   Anion gap 6 5 - 15  Pregnancy, urine  Result Value Ref Range   Preg Test, Ur NEGATIVE NEGATIVE   No results found.  Radiology No results found.  Procedures Procedures (including critical care time)  Medications Ordered in ED Medications  gabapentin (NEURONTIN) capsule 300 mg (has no administration in time range)  enoxaparin (LOVENOX) injection 115 mg (has no administration in time range)  ketorolac (TORADOL) injection 30 mg (30 mg Intramuscular Given 05/12/19 2345)    Score in PMP aware is 650   Initial Impression / Assessment and Plan / ED Course  The legs are not edematous and not clinically swollen at this time. I have given a dose of Lovenox and have scheduled outpatient DVT study for today at [facility] to exclude DVT.  There are no signs of PE and I have a low suspicion for this  as the patient has a normal HR, normal pulse ox and no chest pain or shortness of breath.  The patient has not had travel and the surgery was 3 months ago.  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  I have advised the patient to follow up with her PMD for ongoing testing and treatment of the pain.  Patient verbalizes understanding and agrees to follow up. Patient has been informed that all lab results were reviewed and normal for a menstruating female.  I am not concerned about the  hemoglobin as this is common for menstruating females.   ELLENIE SALOME was evaluated in Emergency Department on 05/13/2019 for the symptoms described in the history of present illness. She was evaluated in the context of the global COVID-19 pandemic, which necessitated consideration that the patient might be at risk for infection with the SARS-CoV-2 virus that causes COVID-19. Institutional protocols and algorithms that pertain to the evaluation of patients at risk for COVID-19 are in a state of rapid change based on information released by regulatory bodies including the CDC and federal and state organizations. These policies and algorithms were followed during the patient's care in the ED.     Have prescribed neurontin and voltaren gel for pain.   Final Clinical Impressions(s) / ED Diagnoses   Final diagnoses:  Pain in both lower extremities    Return for intractable cough, coughing up blood,fevers >100.4 unrelieved by medication, shortness of breath, intractable vomiting, chest pain, shortness of breath, weakness,numbness, changes in speech, facial asymmetry,abdominal pain, passing out,Inability to tolerate liquids or food, cough, altered mental status or any concerns. No signs of systemic illness or infection. The patient is nontoxic-appearing on exam and vital signs are within normal limits.   I have reviewed the triage vital signs and the nursing notes. Pertinent labs &imaging results that were  available during my care of the patient were reviewed by me and considered in my medical decision making (see chart for details).  After history, exam, and medical workup I feel the patient has been appropriately medically screened and is safe for discharge home. Pertinent diagnoses were discussed with the patient. Patient was given return precautions   Gearl Kimbrough, MD 05/13/19 [id number]

## 2019-05-13 NOTE — ED Provider Notes (Signed)
[facility] EMERGENCY DEPARTMENT Provider Note   CSN: [id number] Arrival date & time: 05/13/19  1338     History   Chief Complaint Chief Complaint  Patient presents with  . Leg Pain    HPI [name] [name] is a 33 y.o. female.     33 y.o female with a PMH pression, anxiety, chronic bilateral leg pain presents to the ED for bilateral lower extremity ultrasound.  Patient was in the ED last night, she had a schedule ultrasound for this morning around 8 AM, due to the ultrasound the leg patient checked into the emergency department to obtain ultrasound.  Patient reports she has had bilateral leg pain that begins at her knees and radiates onto her ankles.  She has previously been seen by orthopedics x2 without any reason for her pain.  She has also been seen by her PCP who increased her gabapentin as they thought the symptoms were likely coming from her depression.  Ports no improvement in symptoms with this.  She did receive a shot of Toradol and Lovenox last night and reports no improvement in her symptoms.  The history is provided by the patient.  Leg Pain Associated symptoms: no fever     Past Medical History:  Diagnosis Date  . Anemia   . Anxiety   . DEPRESSION   . GERD (gastroesophageal reflux disease)   . Hypothyroidism    Hx of, normalized TSH during pregnancy 2011  . Infertility, female   . Migraines   . Morbid obesity (HCC)    s/p RY 08/2012 - start weight 290 pounds  . Nephrolithiasis   . PCOS (polycystic ovarian syndrome)   . PONV (postoperative nausea and vomiting)   . Pregnancy induced hypertension     Patient Active Problem List   Diagnosis Date Noted  . Cold sore 03/13/2019  . Insomnia due to other mental disorder 03/13/2019  . Severe episode of recurrent major depressive disorder, without psychotic features (HCC) 03/13/2019  . Biliary dyskinesia 02/14/2019  . RUQ abdominal pain 02/14/2019  . Elevated liver enzymes 02/14/2019  . Pain in  right hand 12/02/2018  . Abnormal serum level of alkaline phosphatase 11/21/2018  . Pain in both lower extremities 11/21/2018  . Nocturnal leg cramps 10/26/2018  . Tachycardia 10/26/2018  . Bilateral lower extremity edema 10/26/2018  . Low serum ferritin level 10/26/2018  . Abdominal pain 08/17/2018  . Nephrolithiasis   . Bleeding diathesis (HCC) 10/13/2017  . Depressive disorder 07/09/2017  . Dysmenorrhea 07/09/2017  . Menorrhagia 07/09/2017  . Reduced libido, due to Lexapro 07/09/2017  . History of cesarean section 07/09/2017  . Binge eating disorder 04/23/2017  . Morbid obesity (HCC) 04/23/2017  . 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 Pes planus 07/16/2016  . Hallux varus, acquired, right 03/18/2016  . Iron deficiency anemia 02/22/2015  . 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 Anxiety 04/10/2010    Past Surgical History:  Procedure Laterality Date  . Quintella Reichert OSTEOTOMY Left 06/24/2017   Procedure: Ralene Bathe;  Surgeon: [name], DPM;  Location: [facility];  Service: Podiatry;  Laterality: Left;  . BUNIONECTOMY Left 06/24/2017   Procedure: Anthoney Harada;  Surgeon: [name], DPM;  Location: [facility];  Service: Podiatry;  Laterality: Left;  . CESAREAN SECTION     x 2  . CHOLECYSTECTOMY N/A 02/15/2019   Procedure: LAPAROSCOPIC CHOLECYSTECTOMY WITH INTRAOPERATIVE CHOLANGIOGRAM;  Surgeon: Andria Meuse, MD;  Location: [facility];  Service: General;  Laterality: N/A;  . FOOT SURGERY    . 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   Procedure: LAPAROSCOPIC ROUX-EN-Y GASTRIC;  Surgeon: Atilano InaEric M Wilson, MD;  Location: [facility];  Service: General;  Laterality: N/A;  laparoscopic roux-en-y gastric bypass  . LITHOTRIPSY Left   . TONSILLECTOMY    . TUBAL LIGATION    . UPPER GI ENDOSCOPY  08/24/2012   Procedure: UPPER GI ENDOSCOPY;  Surgeon: Atilano InaEric M Wilson, MD;  Location: [facility];  Service: General;;  . WISDOM TOOTH EXTRACTION        OB History    Gravida  2   Para  2   Term  2   Preterm  0   AB  0   Living  2     SAB  0   TAB  0   Ectopic  0   Multiple  0   Live Births  2            Home Medications    Prior to Admission medications   Medication Sig Start Date End Date Taking? Authorizing Provider  acetaminophen (TYLENOL) 500 MG tablet Take 2 tablets (1,000 mg total) by mouth every 6 (six) hours as needed for moderate pain. 02/13/19   Fayrene Helperran, Bowie, PA-C  ALPRAZolam Prudy Feeler(XANAX) 1 MG tablet Take 1-2 tablets (1-2 mg total) by mouth 2 (two) times daily as needed for anxiety. 1-2 tab QHS Patient taking differently: Take 1-2 mg by mouth at bedtime as needed for anxiety. 1-2 tab QHS 03/11/19   Helane RimaWallace, Erica, DO  baclofen (LIORESAL) 10 MG tablet Take 1 tablet (10 mg total) by mouth 3 (three) times daily. 04/07/19   Remus LofflerJones, Angel S, PA-C  CALCIUM-MAGNESIUM-ZINC PO Take 3 tablets by mouth daily.    [provider]  diclofenac sodium (VOLTAREN) 1 % GEL Apply topically to affected area qid 12/29/17   Andrena Mewsigby, [name], DO  diclofenac Sodium (VOLTAREN) 1 % GEL Apply 4 g topically 4 (four) times daily. 05/13/19   Palumbo, April, MD  escitalopram (LEXAPRO) 20 MG tablet Take 1 tablet (20 mg total) by mouth daily. 03/11/19   Helane RimaWallace, Erica, DO  gabapentin (NEURONTIN) 100 MG capsule 100mg  in the morning and 200 mg at night 03/11/19   Helane RimaWallace, Erica, DO  gabapentin (NEURONTIN) 100 MG capsule Take 1 capsule (100 mg total) by mouth 2 (two) times daily. 05/13/19   Palumbo, April, MD  lamoTRIgine (LAMICTAL) 25 MG tablet Take 50 mg by mouth daily.    [provider]  linaclotide (LINZESS) 72 MCG capsule Take 1 capsule (72 mcg total) by mouth daily. 07/06/18   Rachael FeeJacobs, Daniel P, MD  Menaquinone-7 (VITAMIN K2 PO) Take 1 capsule by mouth daily.     [provider]  Multiple Vitamins-Iron (ONE-TABLET-DAILY/IRON PO) Take 1 tablet by mouth daily.    [provider]  ondansetron (ZOFRAN-ODT) 4 MG  disintegrating tablet Take 1 tablet (4 mg total) by mouth every 6 (six) hours as needed for nausea. 02/16/19   Maczis, Elmer SowMichael M, PA-C  pantoprazole (PROTONIX) 40 MG tablet Take 1 tablet (40 mg total) by mouth daily. 01/25/19   Esterwood, Amy S, PA-C  predniSONE (STERAPRED UNI-PAK 21 TAB) 10 MG (21) TBPK tablet As directed x 6 days 04/07/19   Remus LofflerJones, Angel S, PA-C  ramelteon (ROZEREM) 8 MG tablet Take 1 tablet (8 mg total) by mouth at bedtime. 03/11/19   Helane RimaWallace, Erica, DO  tizanidine (ZANAFLEX) 2 MG capsule Take 2 capsules (4 mg total) by  mouth 3 (three) times daily. 03/11/19   Helane Rima, DO  valACYclovir (VALTREX) 500 MG tablet 2 po in am and 2 po in pm x 1 day. 03/11/19   Helane Rima, DO  Vitamin D, Ergocalciferol, (DRISDOL) 1.25 MG (50000 UT) CAPS capsule Take 50,000 Units by mouth every Monday.    [provider]    Family History Family History  Problem Relation Age of Onset  . Hyperlipidemia Father   . Hypertension Father   . Kidney disease Father   . Colon cancer Father 44  . Hypertension Mother   . Hypertension Maternal Grandmother   . Uterine cancer Maternal Grandmother 31  . Diabetes Maternal Grandfather     Social History Social History   Tobacco Use  . Smoking status: Never Smoker  . Smokeless tobacco: Never Used  Substance Use Topics  . Alcohol use: No    Comment: rare/socially  . Drug use: No     Allergies   Ambien [zolpidem] and Nsaids   Review of Systems Review of Systems  Constitutional: Negative for fever.  Cardiovascular: Positive for leg swelling.  Musculoskeletal: Positive for myalgias.     Physical Exam Updated Vital Signs BP (!) 109/59 (BP Location: Right Arm)   Pulse 89   Temp 99.1 F (37.3 C) (Oral)   Resp 18   Ht 5\' 4"  (1.626 m)   Wt 116.6 kg   SpO2 100%   BMI 44.11 kg/m   Physical Exam Vitals signs and nursing note reviewed.  Constitutional:      Appearance: Normal appearance.  HENT:     Head: Normocephalic and  atraumatic.     Nose: Nose normal.     Mouth/Throat:     Mouth: Mucous membranes are dry.  Eyes:     Pupils: Pupils are equal, round, and reactive to light.  Neck:     Musculoskeletal: Normal range of motion and neck supple.  Cardiovascular:     Rate and Rhythm: Normal rate.     Pulses:          Dorsalis pedis pulses are 2+ on the right side and 2+ on the left side.       Posterior tibial pulses are 2+ on the right side and 2+ on the left side.  Pulmonary:     Effort: Pulmonary effort is normal.  Abdominal:     General: Abdomen is flat.  Musculoskeletal:        General: Tenderness present. No deformity or signs of injury.     Right lower leg: No edema.     Left lower leg: No edema.  Skin:    General: Skin is warm and dry.  Neurological:     Mental Status: She is alert and oriented to person, place, and time.      ED Treatments / Results  Labs (all labs ordered are listed, but only abnormal results are displayed) Labs Reviewed - No data to display  EKG None  Radiology Venous Img Lower Bilateral  Result Date: 05/13/2019 CLINICAL DATA:  Bilateral lower extremity cramping and weakness. EXAM: BILATERAL LOWER EXTREMITY VENOUS DOPPLER ULTRASOUND TECHNIQUE: Gray-scale sonography with graded compression, as well as color Doppler and duplex ultrasound were performed to evaluate the lower extremity deep venous systems from the level of the common femoral vein and including the common femoral, femoral, profunda femoral, popliteal and calf veins including the posterior tibial, peroneal and gastrocnemius veins when visible. The superficial great saphenous vein was also interrogated. Spectral Doppler was  utilized to evaluate flow at rest and with distal augmentation maneuvers in the common femoral, femoral and popliteal veins. COMPARISON:  None. FINDINGS: RIGHT LOWER EXTREMITY Common Femoral Vein: No evidence of thrombus. Normal compressibility, respiratory phasicity and response to  augmentation. Saphenofemoral Junction: No evidence of thrombus. Normal compressibility and flow on color Doppler imaging. Profunda Femoral Vein: No evidence of thrombus. Normal compressibility and flow on color Doppler imaging. Femoral Vein: No evidence of thrombus. Normal compressibility, respiratory phasicity and response to augmentation. Popliteal Vein: No evidence of thrombus. Normal compressibility, respiratory phasicity and response to augmentation. Calf Veins: No evidence of thrombus. Normal compressibility and flow on color Doppler imaging. Superficial Great Saphenous Vein: No evidence of thrombus. Normal compressibility. Venous Reflux:  None. Other Findings:  None. LEFT LOWER EXTREMITY Common Femoral Vein: No evidence of thrombus. Normal compressibility, respiratory phasicity and response to augmentation. Saphenofemoral Junction: No evidence of thrombus. Normal compressibility and flow on color Doppler imaging. Profunda Femoral Vein: No evidence of thrombus. Normal compressibility and flow on color Doppler imaging. Femoral Vein: No evidence of thrombus. Normal compressibility, respiratory phasicity and response to augmentation. Popliteal Vein: No evidence of thrombus. Normal compressibility, respiratory phasicity and response to augmentation. Calf Veins: No evidence of thrombus. Normal compressibility and flow on color Doppler imaging. Superficial Great Saphenous Vein: No evidence of thrombus. Normal compressibility. Venous Reflux:  None. Other Findings:  None. IMPRESSION: No evidence of deep venous thrombosis in either lower extremity. Electronically Signed   By: Constance Holster M.D.   On: 05/13/2019 14:57    Procedures Procedures (including critical care time)  Medications Ordered in ED Medications  HYDROcodone-acetaminophen (NORCO/VICODIN) 5-325 MG per tablet 1 tablet (1 tablet Oral Not Given 05/13/19 1528)     Initial Impression / Assessment and Plan / ED Course  I have reviewed the  triage vital signs and the nursing notes.  Pertinent labs & imaging results that were available during my care of the patient were reviewed by me and considered in my medical decision making (see chart for details).     Patient with past medical history of depression, anxiety presents to the ED with complaints of bilateral leg pain for the past 10 days.  Patient reports her pain and swelling to it.  She is currently on gabapentin for her neuropathy, reports there has been no improvement in her symptoms.  She reports having a fall yesterday due to her legs becoming weak.  Patient was seen in the ED last night and given a prescription for an ultrasound DVT studies for this morning, due to waiting for ultrasound patient checked into the emergency department in order to obtain testing.  Patient is overall well-appearing, there is no swelling noted on my exam.  Pulses are present, sensation is intact.  Does endorse some tenderness along the calf region however no palpable cords.  Ultrasound of her bilateral lower extremities did not show any deep venous thrombosis.  Patient has had work-ups in the past with orthopedics along with ECP, she reports they are unsure what is causing her pain but they have attempted to titrate her gabapentin up to help with her symptoms.  Patient was provided with hydrocodone while in the ED after reviewing PDMP.  She reports medication she received in the ED yesterday did not help with her pain, she is aware I am unable to send her home on pain medication she will ultimately need to follow-up with PCP.  She knows there is no acute condition that needs emergent treatment  at this time. She understands and agrees with management, return precautions discussed at length.  Portions of this note were generated with Scientist, clinical (histocompatibility and immunogenetics). Dictation errors may occur despite best attempts at proofreading.  Final Clinical Impressions(s) / ED Diagnoses   Final diagnoses:  Pain in  both lower extremities    ED Discharge Orders    None       Claude Manges, PA-C 05/13/19 1536    Melene Plan, DO 05/14/19 [phone]

## 2019-05-16 ENCOUNTER — Other Ambulatory Visit: Payer: Self-pay

## 2019-05-16 ENCOUNTER — Encounter: Payer: Self-pay | Admitting: Family Medicine

## 2019-05-16 ENCOUNTER — Ambulatory Visit (INDEPENDENT_AMBULATORY_CARE_PROVIDER_SITE_OTHER): Payer: No Typology Code available for payment source | Admitting: Family Medicine

## 2019-05-16 VITALS — BP 121/79 | HR 101 | Temp 98.3°F | Ht 64.0 in | Wt 266.8 lb

## 2019-05-16 DIAGNOSIS — K5909 Other constipation: Secondary | ICD-10-CM

## 2019-05-16 DIAGNOSIS — F99 Mental disorder, not otherwise specified: Secondary | ICD-10-CM

## 2019-05-16 DIAGNOSIS — M79661 Pain in right lower leg: Secondary | ICD-10-CM

## 2019-05-16 DIAGNOSIS — E559 Vitamin D deficiency, unspecified: Secondary | ICD-10-CM

## 2019-05-16 DIAGNOSIS — M79662 Pain in left lower leg: Secondary | ICD-10-CM

## 2019-05-16 DIAGNOSIS — E538 Deficiency of other specified B group vitamins: Secondary | ICD-10-CM | POA: Diagnosis not present

## 2019-05-16 DIAGNOSIS — D508 Other iron deficiency anemias: Secondary | ICD-10-CM

## 2019-05-16 DIAGNOSIS — Z800 Family history of malignant neoplasm of digestive organs: Secondary | ICD-10-CM

## 2019-05-16 DIAGNOSIS — F339 Major depressive disorder, recurrent, unspecified: Secondary | ICD-10-CM

## 2019-05-16 DIAGNOSIS — F5105 Insomnia due to other mental disorder: Secondary | ICD-10-CM

## 2019-05-16 HISTORY — DX: Family history of malignant neoplasm of digestive organs: Z80.0

## 2019-05-16 LAB — CBC WITH DIFFERENTIAL/PLATELET
Basophils Absolute: 0 10*3/uL (ref 0.0–0.1)
Basophils Relative: 0.5 % (ref 0.0–3.0)
Eosinophils Absolute: 0.1 10*3/uL (ref 0.0–0.7)
Eosinophils Relative: 1.2 % (ref 0.0–5.0)
HCT: 34.4 % — ABNORMAL LOW (ref 36.0–46.0)
Hemoglobin: 11.3 g/dL — ABNORMAL LOW (ref 12.0–15.0)
Lymphocytes Relative: 25.6 % (ref 12.0–46.0)
Lymphs Abs: 1.9 10*3/uL (ref 0.7–4.0)
MCHC: 32.9 g/dL (ref 30.0–36.0)
MCV: 80.2 fl (ref 78.0–100.0)
Monocytes Absolute: 0.6 10*3/uL (ref 0.1–1.0)
Monocytes Relative: 8.5 % (ref 3.0–12.0)
Neutro Abs: 4.9 10*3/uL (ref 1.4–7.7)
Neutrophils Relative %: 64.2 % (ref 43.0–77.0)
Platelets: 351 10*3/uL (ref 150.0–400.0)
RBC: 4.29 Mil/uL (ref 3.87–5.11)
RDW: 16 % — ABNORMAL HIGH (ref 11.5–15.5)
WBC: 7.6 10*3/uL (ref 4.0–10.5)

## 2019-05-16 LAB — B12 AND FOLATE PANEL
Folate: 13.1 ng/mL (ref >5.9)
Vitamin B-12: 171 pg/mL — ABNORMAL LOW (ref 211–911)

## 2019-05-16 LAB — TSH: TSH: 0.85 u[IU]/mL (ref 0.35–4.50)

## 2019-05-16 LAB — COMPREHENSIVE METABOLIC PANEL
ALT: 38 U/L — ABNORMAL HIGH (ref 0–35)
AST: 29 U/L (ref 0–37)
Albumin: 3.6 g/dL (ref 3.5–5.2)
Alkaline Phosphatase: 182 U/L — ABNORMAL HIGH (ref 39–117)
BUN: 9 mg/dL (ref 6–23)
CO2: 27 mEq/L (ref 19–32)
Calcium: 8.8 mg/dL (ref 8.4–10.5)
Chloride: 107 mEq/L (ref 96–112)
Creatinine, Ser: 0.57 mg/dL (ref 0.40–1.20)
GFR: 120.75 mL/min (ref >60.00)
Glucose, Bld: 57 mg/dL — ABNORMAL LOW (ref 70–99)
Potassium: 4.2 mEq/L (ref 3.5–5.1)
Sodium: 141 mEq/L (ref 135–145)
Total Bilirubin: 0.2 mg/dL (ref 0.2–1.2)
Total Protein: 6.2 g/dL (ref 6.0–8.3)

## 2019-05-16 LAB — SEDIMENTATION RATE: Sed Rate: 15 mm/hr (ref 0–20)

## 2019-05-16 LAB — VITAMIN D 25 HYDROXY (VIT D DEFICIENCY, FRACTURES): VITD: 13.39 ng/mL — ABNORMAL LOW (ref 30.00–100.00)

## 2019-05-16 MED ORDER — TRAMADOL HCL 50 MG PO TABS: 50.0000 mg | ORAL_TABLET | Freq: Four times a day (QID) | ORAL | 0 refills | Status: DC | PRN

## 2019-05-16 MED FILL — traMADol HCL 50 MG TABS: 50 | 7 days supply | Qty: 30 | Fill #0

## 2019-05-16 NOTE — Patient Instructions (Signed)
Please return in 8 weeks to recheck vitamin levels and pain.   Use the tramadol as needed to help with pain.  Take all of your vitamins daily as instructed.   It was a pleasure meeting you today! Thank you for choosing [place] to meet your healthcare needs! I truly look forward to working with you. If you have any questions or concerns, please send me a message via Mychart or call the office at [phone].

## 2019-05-16 NOTE — Progress Notes (Signed)
Subjective  CC:  Chief Complaint  Patient presents with   Leg Pain    mainly front part of leg radiates to back of leg    Transitions Of Care   Depression    HPI: [name] [name] is a 33 y.o. female who presents to [facility] [place] today to establish care with me as a new patient.  I've reviewed extensive records from prior PCP, specialist and recent ER visit.  She has the following concerns or needs:  Bilateral ant leg pain since march. 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 Aching throbbing pain internally but also sore to the touch w/o color or temperature or skin changes. Has had foot surgeries but no h/o trauma. Has minimal dependent edema. No venous insufficiency. Has gained weight. Depression has stabilized but she struggles. Has seen SM w/ normal exam and xrays; could not afford the recommended MRI. Recent er eval showed negative dopplers. She has tried tylenol and topical voltaren gel w/o relief. Took a norco at ER and that helped. Can't use nsaids due to h/o gastric bypass.   Has multiple vitamin deficiencies and is taking supplements. Due for recheck.  Reviewed other chronic medical problems.   Assessment  1. Pain in both lower legs   2. Iron deficiency anemia secondary to inadequate dietary iron intake   3. Vitamin B12 deficiency   4. Vitamin D deficiency   5. Insomnia due to other mental disorder   6. Family history of colon cancer   7. Chronic constipation   8. Morbid obesity ([place])   9. Major depression, recurrent, chronic (HCC)      Plan   Pain: unclear etiology: ? Related to depression or other. Atypical in that it is bilateral, tender to touch w/o rash or skin change, but also "deep" pain. ? Somatic. ? RSD, atypial for neuropathy. Could be due in part to vitamin deficiencies. Will recheck levels today and get those normalized first to see if helps. Tramadol as needed for pain to help  calm down her sxs. Educated on appropriate use/risks and benefits. Close f/u  Continue other meds w/o changes now.   Follow up:  Return in about 8 weeks (around 07/11/2019) for recheck. Orders Placed This Encounter  Procedures   Vit D 25OH   TSH   Iron, TIBC Ferritin   B12 and Folate Panel   CBC w/Diff   CMP   ESR   Meds ordered this encounter  Medications   traMADol (ULTRAM) 50 MG tablet    Sig: Take 1 tablet (50 mg total) by mouth every 6 (six) hours as needed for moderate pain.    Dispense:  30 tablet    Refill:  0     Depression screen [facility] 2/9 03/11/2019 08/17/2018 06/01/2018 04/20/2018 01/12/2018  Decreased Interest 1 1 1 2  0  Down, Depressed, Hopeless 1 0 2 2 0  PHQ - 2 Score 2 1 3 4  0  Altered sleeping 3 3 3 3 1   Tired, decreased energy 1 1 2 2  0  Change in appetite 2 3 2 2 1   Feeling bad or failure about yourself  1 0 0 0 0  Trouble concentrating 0 0 1 1 0  Moving slowly or fidgety/restless 0 0 0 1 0  Suicidal thoughts 0 0 0 0 0  PHQ-9 Score 9 8 11 13 2   Difficult doing work/chores Somewhat difficult - Very difficult Very difficult  Somewhat difficult  Some recent data might be hidden    We updated and reviewed the patient's past history in detail and it is documented below.  Patient Active Problem List   Diagnosis Date Noted   Family history of colon cancer 2019/06/07    Father, 53s; deceased.     Chronic constipation 06-07-19   Major depression, recurrent, chronic ([place]) 2019/06/07   Insomnia due to other mental disorder 03/13/2019   Pain in both lower extremities 11/21/2018   Nocturnal leg cramps 10/26/2018   Nephrolithiasis    Reduced libido, due to Lexapro 07/09/2017   Binge eating disorder 04/23/2017    Meets criteria. Trial of Vyvanse. Recheck in one month for BP, weight check.    Morbid obesity (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     Health Maintenance  Topic Date Due   PAP SMEAR-Modifier  04/14/2020   COLONOSCOPY  07/26/2025   TETANUS/TDAP  07/16/2026   INFLUENZA VACCINE  Completed   HIV Screening  Completed   Immunization History  Administered Date(s) Administered   Influenza Split 03/17/2011   Influenza,inj,Quad PF,6+ Mos 03/16/2013, 04/04/2016, 03/11/2019   Influenza-Unspecified 03/16/2014, 02/22/2017, 03/31/2018   Td 06/16/2006   Tdap 07/16/2016   No outpatient medications have been marked as taking for the 06-07-2019 encounter (Office Visit) with Leamon Arnt, MD.    Allergies: Patient is allergic to ambien [zolpidem] and nsaids. Past Medical History Patient  has a past medical history of Anemia, Anxiety, DEPRESSION, Family history of colon cancer (06/07/19), GERD (gastroesophageal reflux disease), Hypothyroidism, Infertility, female, Migraines, Morbid obesity ([place]), Nephrolithiasis, PCOS (polycystic ovarian syndrome), PONV (postoperative nausea and vomiting), and Pregnancy induced hypertension. Past Surgical History Patient  has a past surgical history that includes Cesarean section; Tonsillectomy; Wisdom tooth extraction; Tubal ligation; Lithotripsy (Left); Gastric Roux-En-Y (N/A, 08/24/2012); Upper gi endoscopy (08/24/2012); Foot surgery; Bunionectomy (Left, 06/24/2017); [place] osteotomy (Left, 06/24/2017); and Cholecystectomy (N/A, 02/15/2019). Family History: Patient family history includes Colon cancer (age of onset: 79) in her father; Diabetes in her maternal grandfather; Hyperlipidemia in her father; Hypertension in her father, maternal grandmother, and mother; Kidney disease in her father; Uterine cancer (age of onset: 33) in her maternal grandmother. Social History:  Patient  reports that she has never smoked. She has never used smokeless tobacco. She reports that she does not drink alcohol or use drugs.  Review of  Systems: Constitutional: negative for fever or malaise Ophthalmic: negative for photophobia, double vision or loss of vision Cardiovascular: negative for chest pain, dyspnea on exertion, or new LE swelling Respiratory: negative for SOB or persistent cough Gastrointestinal: negative for abdominal pain, change in bowel habits or melena Genitourinary: negative for dysuria or gross hematuria Musculoskeletal: negative for new gait disturbance or muscular weakness Integumentary: negative for new or persistent rashes Neurological: negative for TIA or stroke symptoms Psychiatric: negative for SI or delusions Allergic/Immunologic: negative for hives  Patient Care Team    Relationship Specialty Notifications Start End  Leamon Arnt, MD PCP - General Family Medicine  05/13/19   Crawford Givens, MD Referring Physician Obstetrics and Gynecology  09/13/10   Himmelrich, Bryson Ha, RD (Inactive) Dietitian Bariatrics  06/29/12   Hilts, Legrand Como, MD Consulting Physician Sports Medicine  06-07-2019   Milus Banister, MD Attending Physician Gastroenterology  Jun 07, 2019   Greer Pickerel, MD Consulting Physician General  Surgery  05/16/19     Objective  Vitals: BP 121/79    Pulse (!) 101    Temp 98.3 F (36.8 C)    Ht 5' 4"  (1.626 m)    Wt 266 lb 12.8 oz (121 kg)    LMP 05/02/2019    SpO2 98%    BMI 45.80 kg/m  General:  Well developed, well nourished, no acute distress  Psych:  Alert and oriented,normal mood and affect HEENT:  Normocephalic, atraumatic, non-icteric sclera, PERRL, no thyromegaly Cardiovascular:  RRR without gallop, rub or murmur, nondisplaced PMI Respiratory:  Good breath sounds bilaterally, CTAB with normal respiratory effort MSK: no deformities, contusions. Joints are without erythema or swelling, anterior shins appear normal, reports ttp at different spots over muscles w/o nodules palpated, nl skin. No edema Skin:  Warm, no rashes or suspicious lesions noted Neurologic:    Mental status is  normal. Gross motor and sensory exams are normal. Normal gait  Lab Results  Component Value Date   VITAMINB12 171 (L) 05/16/2019   Lab Results  Component Value Date   TSH 0.85 05/16/2019   Lab Results  Component Value Date   IRON 74 10/19/2018   TIBC 454 (H) 10/19/2018   FERRITIN 9 (L) 10/19/2018   Lab Results  Component Value Date   WBC 7.6 05/16/2019   HGB 11.3 (L) 05/16/2019   HCT 34.4 (L) 05/16/2019   MCV 80.2 05/16/2019   PLT 351.0 05/16/2019   Lab Results  Component Value Date   ALKPHOS 182 (H) 05/16/2019   ALKPHOS 110 08/16/2015   Vit D 16, may 2020   Commons side effects, risks, benefits, and alternatives for medications and treatment plan prescribed today were discussed, and the patient expressed understanding of the given instructions. Patient is instructed to call or message via MyChart if he/she has any questions or concerns regarding our treatment plan. No barriers to understanding were identified. We discussed Red Flag symptoms and signs in detail. Patient expressed understanding regarding what to do in case of urgent or emergency type symptoms.   Medication list was reconciled, printed and provided to the patient in AVS. Patient instructions and summary information was reviewed with the patient as documented in the AVS. This note was prepared with assistance of Dragon voice recognition software. Occasional wrong-word or sound-a-like substitutions may have occurred due to the inherent limitations of voice recognition software  This visit occurred during the SARS-CoV-2 public health emergency.  Safety protocols were in place, including screening questions prior to the visit, additional usage of staff PPE, and extensive cleaning of exam room while observing appropriate contact time as indicated for disinfecting solutions.

## 2019-05-17 ENCOUNTER — Telehealth: Payer: Self-pay | Admitting: Neurology

## 2019-05-17 ENCOUNTER — Encounter: Payer: Self-pay | Admitting: Neurology

## 2019-05-17 LAB — IRON,TIBC AND FERRITIN PANEL
%SAT: 6 % (calc) — ABNORMAL LOW (ref 16–45)
Ferritin: 5 ng/mL — ABNORMAL LOW (ref 16–154)
Iron: 24 ug/dL — ABNORMAL LOW (ref 40–190)
TIBC: 392 mcg/dL (calc) (ref 250–450)

## 2019-05-17 NOTE — Telephone Encounter (Signed)
Called patient to discuss sleep study results. No answer at this time. LVM for the patient to call back.  Advised the patient I would also send a mychart message.

## 2019-05-17 NOTE — Telephone Encounter (Signed)
-----   Message from Darleen Crocker, RN sent at 05/16/2019  5:05 PM EST -----  ----- Message ----- From: Star Age, MD Sent: 05/11/2019   6:55 PM EST To: Lester Dresden, RN  Patient referred by Dr. Juleen China, seen by me on 03/28/19, diagnostic PSG on 04/29/19.   Please call and notify the patient that the recent sleep study did not show any significant obstructive sleep apnea with the exception of mild REM related OSA. For this, CPAP or autoPAP therapy are not warranted; ongoing weight loss and avoidance of the supine sleep position are recommended.  Please remind patient to try to maintain good sleep hygiene, which means: Keep a regular sleep and wake schedule and make enough time for sleep (7 1/2 to 8 1/2 hours for the average adult), try not to exercise or have a meal within 2 hours of your bedtime, try to keep your bedroom conducive for sleep, that is, cool and dark, without light distractors such as an illuminated alarm clock, and refrain from watching TV right before sleep or in the middle of the night and do not keep the TV or radio on during the night. If a nightlight is used, have it away from the visual field. Also, try not to use or play on electronic devices at bedtime, such as your cell phone, tablet PC or laptop. If you like to read at bedtime on an electronic device, try to dim the background light as much as possible. Do not eat in the middle of the night. Keep pets away from the bedroom environment. For stress relief, try meditation, deep breathing exercises (there are many books and CDs available), a white noise machine or fan can help to diffuse other noise distractors, such as traffic noise. Do not drink alcohol before bedtime, as it can disturb sleep and cause middle of the night awakenings. Never mix alcohol and sedating medications! Avoid narcotic pain medication close to bedtime, as opioids/narcotics can suppress breathing drive and breathing effort.   She can FU with her referring  provider at this point.   Thanks,  Star Age, MD, PhD [facility])

## 2019-05-24 ENCOUNTER — Ambulatory Visit (INDEPENDENT_AMBULATORY_CARE_PROVIDER_SITE_OTHER): Payer: No Typology Code available for payment source

## 2019-05-24 ENCOUNTER — Ambulatory Visit (INDEPENDENT_AMBULATORY_CARE_PROVIDER_SITE_OTHER): Payer: No Typology Code available for payment source | Admitting: Psychology

## 2019-05-24 ENCOUNTER — Other Ambulatory Visit: Payer: Self-pay

## 2019-05-24 DIAGNOSIS — F5101 Primary insomnia: Secondary | ICD-10-CM

## 2019-05-24 DIAGNOSIS — F4322 Adjustment disorder with anxiety: Secondary | ICD-10-CM

## 2019-05-24 DIAGNOSIS — E538 Deficiency of other specified B group vitamins: Secondary | ICD-10-CM

## 2019-05-24 MED ORDER — CYANOCOBALAMIN 1000 MCG/ML IJ SOLN
1000.0000 ug | Freq: Once | INTRAMUSCULAR | Status: AC
  Administered 2019-05-24: 1000 ug via INTRAMUSCULAR

## 2019-05-24 NOTE — Progress Notes (Signed)
Per orders of Dr. Jonni Sanger , injection of B12  Given in Rt deltoid  by Sandford Craze. RN  Patient tolerated injection well.

## 2019-05-25 ENCOUNTER — Telehealth: Payer: No Typology Code available for payment source | Admitting: Nurse Practitioner

## 2019-05-25 DIAGNOSIS — B029 Zoster without complications: Secondary | ICD-10-CM | POA: Diagnosis not present

## 2019-05-25 MED ORDER — VALACYCLOVIR HCL 1 G PO TABS: 1000.0000 mg | ORAL_TABLET | Freq: Three times a day (TID) | ORAL | 0 refills | Status: DC

## 2019-05-25 MED ORDER — GABAPENTIN 300 MG PO CAPS: 300.0000 mg | ORAL_CAPSULE | Freq: Two times a day (BID) | ORAL | 0 refills | Status: DC

## 2019-05-25 NOTE — Progress Notes (Signed)
E-visit for Shingles   We are sorry that you are not feeling well. Here is how we plan to help!  Based on what you shared with me it looks like you have shingles.  Shingles or herpes zoster, is a common infection of the nerves.  It is a painful rash caused by the herpes zoster virus.  This is the same virus that causes chickenpox.  After a person has chickenpox, the virus remains inactive in the nerve cells.  Years later, the virus can become active again and travel to the skin.  It typically will appear on one side of the face or body.  Burning or shooting pain, tingling, or itching are early signs of the infection.  Blisters typically scab over in 7 to 10 days and clear up within 2-4 weeks. Shingles is only contagious to people that have never had the chickenpox, the chickenpox vaccine, or anyone who has a compromised immune system.  You should avoid contact with these type of people until your blisters scab over.  I have prescribed Valacyclovir 1g three times daily for 7 days and also Gabapentin 300mg twice daily as needed for pain   HOME CARE: Apply ice packs (wrapped in a thin towel), cool compresses, or soak in cool bath to help reduce pain. Use calamine lotion to calm itchy skin. Avoid scratching the rash. Avoid direct sunlight.  GET HELP RIGHT AWAY IF: Symptoms that don't away after treatment. A rash or blisters near your eye. Increased drainage, fever, or rash after treatment. Severe pain that doesn't go away.   MAKE SURE YOU   Understand these instructions. Will watch your condition. Will get help right away if you are not doing well or get worse.  Thank you for choosing an e-visit.  Your e-visit answers were reviewed by a board certified advanced clinical practitioner to complete your personal care plan. Depending upon the condition, your plan could have included both over the counter or prescription medications.  Please review your pharmacy choice. Make sure the pharmacy  is open so you can pick up prescription now. If there is a problem, you may contact your provider through MyChart messaging and have the prescription routed to another pharmacy.  Your safety is important to us. If you have drug allergies check your prescription carefully.   For the next 24 hours you can use MyChart to ask questions about today's visit, request a non-urgent call back, or ask for a work or school excuse. You will get an email in the next two days asking about your experience. I hope that your e-visit has been valuable and will speed your recovery.  5-10 minutes spent reviewing and documenting in chart.  

## 2019-05-30 ENCOUNTER — Other Ambulatory Visit: Payer: Self-pay

## 2019-05-30 ENCOUNTER — Encounter: Payer: Self-pay | Admitting: Family Medicine

## 2019-05-30 MED ORDER — CYANOCOBALAMIN 1000 MCG/ML IJ SOLN: INTRAMUSCULAR | 3 refills | Status: DC

## 2019-05-30 MED ORDER — "SYRINGE/NEEDLE (DISP) 25G X 1"" 3 ML MISC": 0 refills | Status: DC

## 2019-05-30 MED FILL — BD 3 ML SYRINGE 25GX1": 25G X 1" | 84 days supply | Qty: 12 | Fill #0

## 2019-05-30 MED FILL — BD 3 ML SYRINGE 25GX1: 25G X 1" | 84 days supply | Qty: 12 | Fill #0

## 2019-05-30 MED FILL — CYANOCOBALAMIN 1,000 MCG/ML: 1000 | 84 days supply | Qty: 6 | Fill #0

## 2019-05-31 ENCOUNTER — Encounter: Payer: Self-pay | Admitting: Family Medicine

## 2019-06-01 ENCOUNTER — Ambulatory Visit (INDEPENDENT_AMBULATORY_CARE_PROVIDER_SITE_OTHER): Payer: No Typology Code available for payment source | Admitting: Family Medicine

## 2019-06-01 ENCOUNTER — Encounter: Payer: Self-pay | Admitting: Family Medicine

## 2019-06-01 DIAGNOSIS — E559 Vitamin D deficiency, unspecified: Secondary | ICD-10-CM | POA: Diagnosis not present

## 2019-06-01 DIAGNOSIS — F99 Mental disorder, not otherwise specified: Secondary | ICD-10-CM

## 2019-06-01 DIAGNOSIS — M79662 Pain in left lower leg: Secondary | ICD-10-CM

## 2019-06-01 DIAGNOSIS — M79661 Pain in right lower leg: Secondary | ICD-10-CM

## 2019-06-01 DIAGNOSIS — E538 Deficiency of other specified B group vitamins: Secondary | ICD-10-CM | POA: Diagnosis not present

## 2019-06-01 DIAGNOSIS — F339 Major depressive disorder, recurrent, unspecified: Secondary | ICD-10-CM | POA: Diagnosis not present

## 2019-06-01 DIAGNOSIS — F410 Panic disorder [episodic paroxysmal anxiety] without agoraphobia: Secondary | ICD-10-CM | POA: Insufficient documentation

## 2019-06-01 DIAGNOSIS — F5105 Insomnia due to other mental disorder: Secondary | ICD-10-CM

## 2019-06-01 DIAGNOSIS — D508 Other iron deficiency anemias: Secondary | ICD-10-CM

## 2019-06-01 MED ORDER — ALPRAZOLAM 1 MG PO TABS: 1.0000 mg | ORAL_TABLET | Freq: Every evening | ORAL | 2 refills | Status: DC | PRN

## 2019-06-01 MED ORDER — BUSPIRONE HCL 7.5 MG PO TABS: 7.5000 mg | ORAL_TABLET | Freq: Three times a day (TID) | ORAL | 2 refills | Status: DC | PRN

## 2019-06-01 MED FILL — BUSPIRONE HCL 7.5 MG TABS: 7.5 | 20 days supply | Qty: 60 | Fill #0

## 2019-06-01 NOTE — Progress Notes (Signed)
Virtual Visit via Video Note  SUBJECTIVE CC:  Chief Complaint  Patient presents with  . Anxiety    I connected with [name] [name] on 06/01/19 at  3:20 PM EST by a video enabled telemedicine application and verified that I am speaking with the correct person using two identifiers. Location patient: Home Location provider: [facility] [place] participating in the virtual visit: [name] [name], Leamon Arnt, MD Serita Sheller, [place] discussed the limitations of evaluation and management by telemedicine and the availability of in person appointments. The patient expressed understanding and agreed to proceed.  HPI: [name] [name] is a 33 y.o. female who was contacted today to address the problems listed above in the chief complaint/mood.  See mychart notes; unfortunately husband lost his job and pt is only employed member of family right now. 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 She doesn't want to lose her job. Good support system through church and family. No worsening depression sxs: no anhedonia, lack of motivation. Feels mood is stable on lamictal and lexapro.   Chronic insomnia on benzos: Uses xanax at night for sleep. Will be due for refills on dec 30th. I reviewed patient's records from the PMP aware controlled substance registry today.   Chronic leg pain f/u: in part due to multiple vitamin and iron deficiencies. Restarted vit b12 injections, and vit D supplements. Needs iron infusion. Used ultram initially but no longer needing it. Leg pain is resolving.   Anemia: on oral iron but will need iron infusions. Need to defer due to anxiety and possible loss of medical insurance for now.     ASSESSMENT 1. Major depression, recurrent, chronic ([place])   2. Vitamin B12 deficiency   3. Vitamin D deficiency   4. Insomnia due to other mental disorder   5.  Pain in both lower legs   6. Panic disorder   7. Acquired iron deficiency anemia due to decreased absorption      Depression/anxiety/panic:  Had been controlled but now worse due to life stressors. Counseling done: manage negative thoughts; avoid catastrophizing, and realization that husband's current unemployment isn't linked to her employment. Continue mood meds, sleep meds and add buspar for daytime anxiety. Education on expectations and use.   On vitamin supplements now.   Insomnia is managed with chronic benzo's  Leg pain is improving. Has f/u in January.  I discussed the assessment and treatment plan with the patient. The patient was provided an opportunity to ask questions and all were answered. The patient agreed with the plan and demonstrated an understanding of the instructions.   The patient was advised to call back or seek an in-person evaluation if the symptoms worsen or if the condition fails to improve as anticipated. Follow up: f/u as scheduled.  07/12/2019  Meds ordered this encounter  Medications  . busPIRone (BUSPAR) 7.5 MG tablet    Sig: Take 1 tablet (7.5 mg total) by mouth 3 (three) times daily as needed.    Dispense:  60 tablet    Refill:  2  . ALPRAZolam (XANAX) 1 MG tablet    Sig: Take 1-2 tablets (1-2 mg total) by mouth at bedtime as needed for anxiety.    Dispense:  60 tablet    Refill:  2      I reviewed the patients updated PMH, FH, and SocHx.    Patient  Active Problem List   Diagnosis Date Noted  . Panic disorder 06/01/2019    Priority: High  . Family history of colon cancer 05/16/2019    Priority: High  . Major depression, recurrent, chronic (HCC) 05/16/2019    Priority: High  . Insomnia due to other mental disorder 03/13/2019    Priority: High  . Morbid obesity (HCC) 04/23/2017    Priority: High  . Acquired iron deficiency anemia due to decreased absorption 02/22/2015    Priority: High  . 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 Vitamin B12 deficiency 06/01/2019    Priority: Medium  . Chronic constipation 05/16/2019    Priority: Medium  . Pain in both lower extremities 11/21/2018    Priority: Medium  . Nocturnal leg cramps 10/26/2018    Priority: Medium  . Nephrolithiasis     Priority: Medium  . Binge eating disorder 04/23/2017    Priority: Medium  . Vitamin D deficiency 06/01/2019    Priority: Low  . Reduced libido, due to Lexapro 07/09/2017    Priority: Low   No outpatient medications have been marked as taking for the 06/01/19 encounter (Office Visit) with Willow Ora, MD.    Allergies: Patient is allergic to ambien [zolpidem] and nsaids. Family History: Patient family history includes Colon cancer (age of onset: 19) in her father; Diabetes in her maternal grandfather; Hyperlipidemia in her father; Hypertension in her father, maternal grandmother, and mother; Kidney disease in her father; Uterine cancer (age of onset: 70) in her maternal grandmother. Social History:  Patient  reports that she has never smoked. She has never used smokeless tobacco. She reports that she does not drink alcohol or use drugs.  Review of Systems: Constitutional: Negative for fever malaise or anorexia Cardiovascular: negative for chest pain Respiratory: negative for SOB or persistent cough Gastrointestinal: negative for abdominal pain  OBJECTIVE/OBSERVATIONS: General: no acute distress, well appearing, no apparent distress, well groomed Psych:  Alert and oriented x 3,anxious. Normal speech  Willow Ora, MD

## 2019-06-01 NOTE — Telephone Encounter (Signed)
[facility]-Plz see refill req/thx dmf

## 2019-06-07 ENCOUNTER — Ambulatory Visit (INDEPENDENT_AMBULATORY_CARE_PROVIDER_SITE_OTHER): Payer: No Typology Code available for payment source | Admitting: Psychology

## 2019-06-07 DIAGNOSIS — F4322 Adjustment disorder with anxiety: Secondary | ICD-10-CM | POA: Diagnosis not present

## 2019-06-07 DIAGNOSIS — F5101 Primary insomnia: Secondary | ICD-10-CM | POA: Diagnosis not present

## 2019-06-13 MED FILL — PANTOPRAZOLE SOD DR 40 MG T: 40 | 30 days supply | Qty: 30 | Fill #3

## 2019-06-13 MED FILL — ESCITALOPRAM 20 MG TABLET: 20 | 90 days supply | Qty: 90 | Fill #1

## 2019-06-13 MED FILL — ALPRAZolam 1 MG TABS: 1 | 30 days supply | Qty: 60 | Fill #0

## 2019-06-13 MED FILL — VALACYCLOVIR HCL 500 MG TAB: 500 | 1 days supply | Qty: 4 | Fill #1

## 2019-06-13 MED FILL — LAMOTRIGINE 25 MG TABS: 25 | 90 days supply | Qty: 180 | Fill #0

## 2019-06-15 ENCOUNTER — Telehealth: Payer: Self-pay

## 2019-06-15 ENCOUNTER — Encounter: Payer: Self-pay | Admitting: Family Medicine

## 2019-06-15 NOTE — Telephone Encounter (Signed)
PA completed for Ramelteon 8 mg tabs via CoverMyMeds

## 2019-06-17 ENCOUNTER — Other Ambulatory Visit: Payer: Self-pay

## 2019-06-17 ENCOUNTER — Emergency Department (HOSPITAL_COMMUNITY)
Admission: EM | Admit: 2019-06-17 | Discharge: 2019-06-17 | Disposition: A | Discharge status: Home / Self Care | Payer: No Typology Code available for payment source | Attending: Emergency Medicine | Admitting: Emergency Medicine

## 2019-06-17 ENCOUNTER — Emergency Department (HOSPITAL_COMMUNITY): Payer: No Typology Code available for payment source

## 2019-06-17 ENCOUNTER — Encounter (HOSPITAL_COMMUNITY): Payer: Self-pay

## 2019-06-17 DIAGNOSIS — Z79899 Other long term (current) drug therapy: Secondary | ICD-10-CM | POA: Insufficient documentation

## 2019-06-17 DIAGNOSIS — R112 Nausea with vomiting, unspecified: Secondary | ICD-10-CM | POA: Diagnosis present

## 2019-06-17 DIAGNOSIS — Z20822 Contact with and (suspected) exposure to covid-19: Secondary | ICD-10-CM | POA: Insufficient documentation

## 2019-06-17 DIAGNOSIS — E039 Hypothyroidism, unspecified: Secondary | ICD-10-CM | POA: Insufficient documentation

## 2019-06-17 DIAGNOSIS — R6889 Other general symptoms and signs: Secondary | ICD-10-CM

## 2019-06-17 DIAGNOSIS — J111 Influenza due to unidentified influenza virus with other respiratory manifestations: Secondary | ICD-10-CM | POA: Insufficient documentation

## 2019-06-17 LAB — COMPREHENSIVE METABOLIC PANEL
ALT: 52 U/L — ABNORMAL HIGH (ref 0–44)
AST: 22 U/L (ref 15–41)
Albumin: 3.5 g/dL (ref 3.5–5.0)
Alkaline Phosphatase: 165 U/L — ABNORMAL HIGH (ref 38–126)
Anion gap: 10 (ref 5–15)
BUN: 10 mg/dL (ref 6–20)
CO2: 28 mmol/L (ref 22–32)
Calcium: 8.7 mg/dL — ABNORMAL LOW (ref 8.9–10.3)
Chloride: 104 mmol/L (ref 98–111)
Creatinine, Ser: 0.54 mg/dL (ref 0.44–1.00)
GFR calc Af Amer: >60 mL/min (ref >60)
GFR calc non Af Amer: >60 mL/min (ref >60)
Glucose, Bld: 79 mg/dL (ref 70–99)
Potassium: 4 mmol/L (ref 3.5–5.1)
Sodium: 142 mmol/L (ref 135–145)
Total Bilirubin: 0.4 mg/dL (ref 0.3–1.2)
Total Protein: 6.4 g/dL — ABNORMAL LOW (ref 6.5–8.1)

## 2019-06-17 LAB — SARS CORONAVIRUS 2 (TAT 6-24 HRS): SARS Coronavirus 2: NEGATIVE

## 2019-06-17 LAB — CBC WITH DIFFERENTIAL/PLATELET
Abs Immature Granulocytes: 0.02 10*3/uL (ref 0.00–0.07)
Basophils Absolute: 0 10*3/uL (ref 0.0–0.1)
Basophils Relative: 0 %
Eosinophils Absolute: 0.1 10*3/uL (ref 0.0–0.5)
Eosinophils Relative: 2 %
HCT: 38.9 % (ref 36.0–46.0)
Hemoglobin: 12.3 g/dL (ref 12.0–15.0)
Immature Granulocytes: 0 %
Lymphocytes Relative: 26 %
Lymphs Abs: 2.1 10*3/uL (ref 0.7–4.0)
MCH: 27.3 pg (ref 26.0–34.0)
MCHC: 31.6 g/dL (ref 30.0–36.0)
MCV: 86.3 fL (ref 80.0–100.0)
Monocytes Absolute: 0.6 10*3/uL (ref 0.1–1.0)
Monocytes Relative: 8 %
Neutro Abs: 5.2 10*3/uL (ref 1.7–7.7)
Neutrophils Relative %: 64 %
Platelets: 284 10*3/uL (ref 150–400)
RBC: 4.51 MIL/uL (ref 3.87–5.11)
RDW: 16.9 % — ABNORMAL HIGH (ref 11.5–15.5)
WBC: 8 10*3/uL (ref 4.0–10.5)
nRBC: 0 % (ref 0.0–0.2)

## 2019-06-17 LAB — HCG, QUANTITATIVE, PREGNANCY: hCG, Beta Chain, Quant, S: <1 m[IU]/mL (ref <5)

## 2019-06-17 LAB — URINALYSIS, ROUTINE W REFLEX MICROSCOPIC
Bilirubin Urine: NEGATIVE
Glucose, UA: NEGATIVE mg/dL
Hgb urine dipstick: NEGATIVE
Ketones, ur: NEGATIVE mg/dL
Leukocytes,Ua: NEGATIVE
Nitrite: NEGATIVE
Protein, ur: NEGATIVE mg/dL
Specific Gravity, Urine: 1.011 (ref 1.005–1.030)
pH: 7 (ref 5.0–8.0)

## 2019-06-17 LAB — LIPASE, BLOOD: Lipase: 27 U/L (ref 11–51)

## 2019-06-17 IMAGING — CR DG ABDOMEN 2V
2 series · 2 of 2 positions shown · non-contrast
Comparison: None.

CLINICAL DATA: Abdominal pain

EXAM:
ABDOMEN - 2 VIEW

[t abdomen supine (1 of 2)]
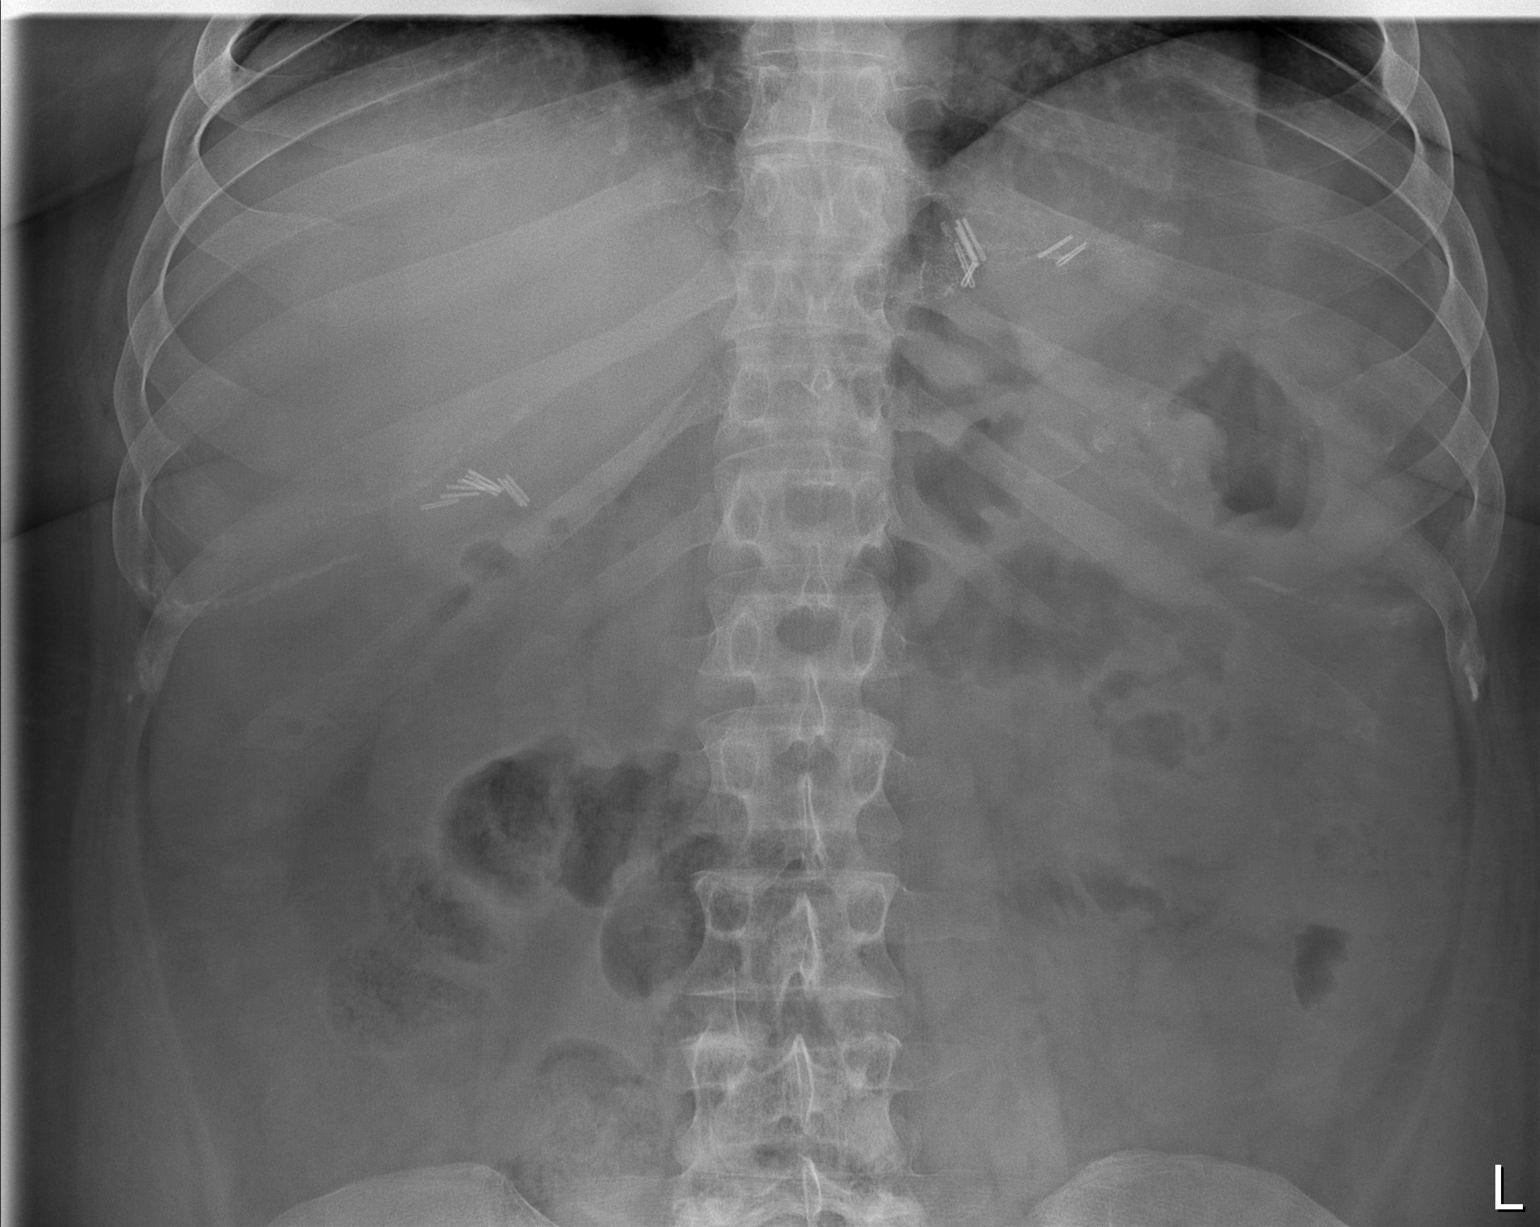

[t abdomen supine (2 of 2)]
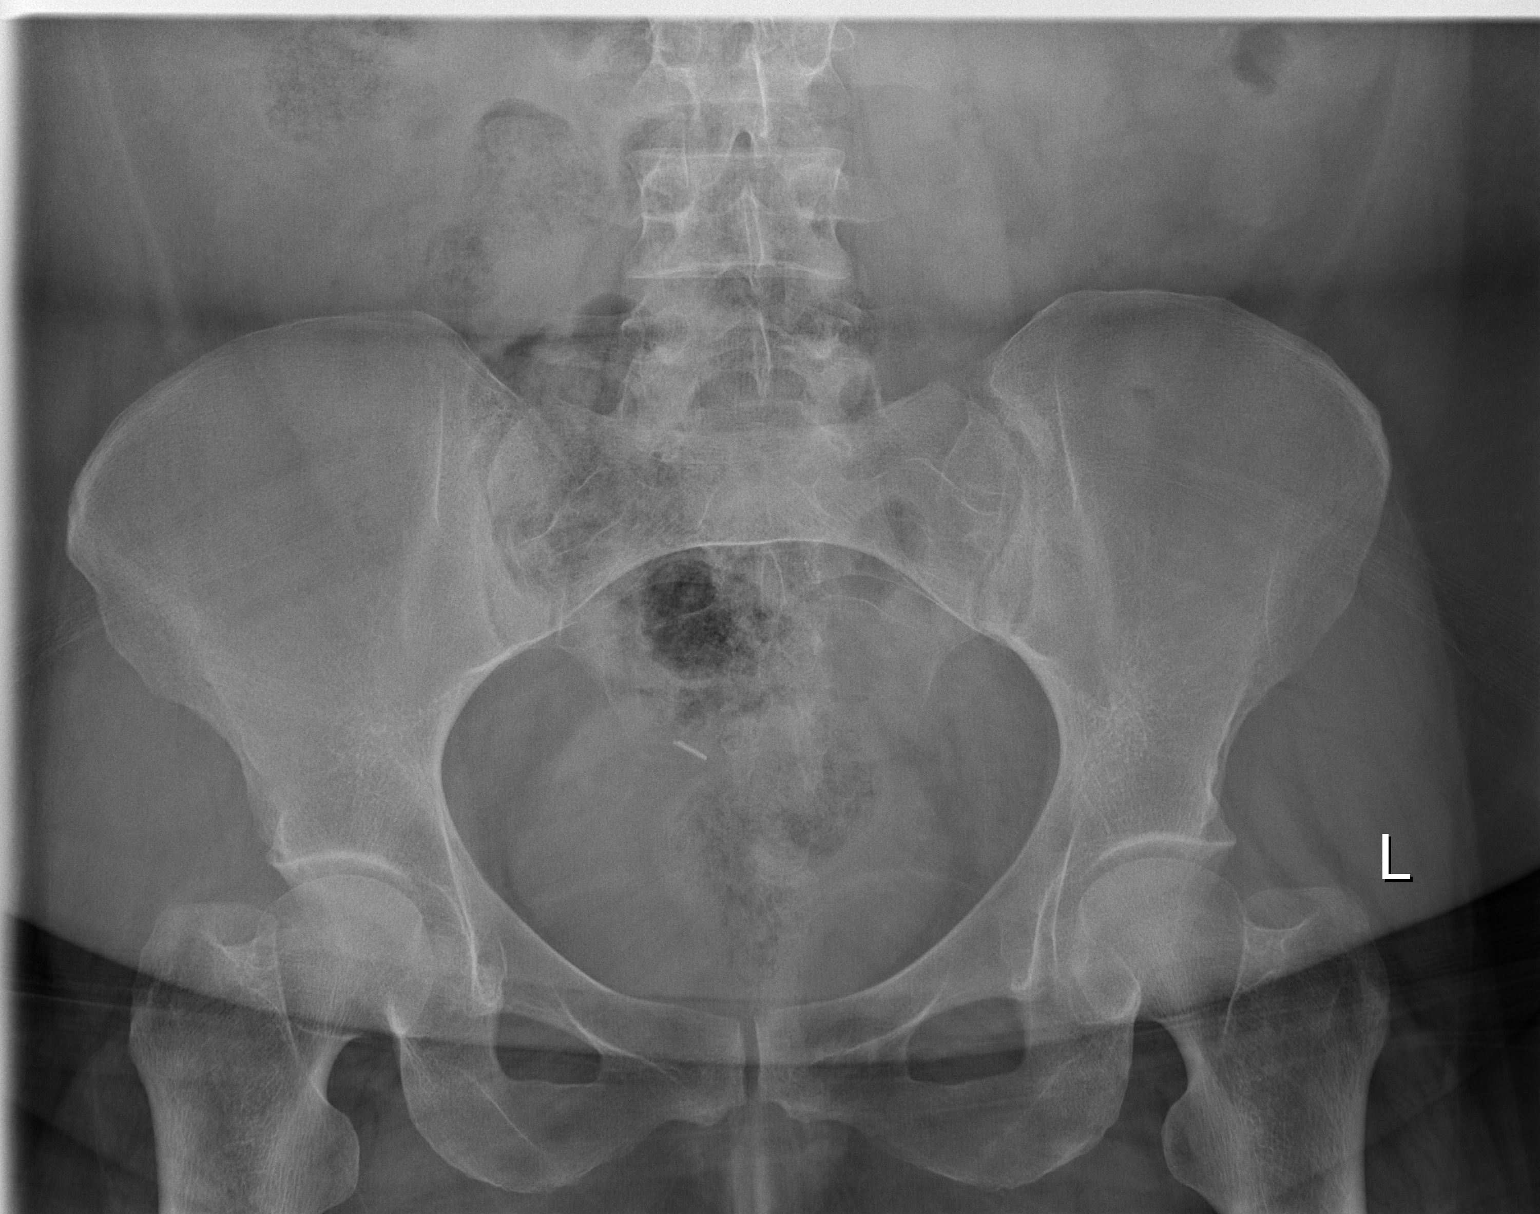

[2 of 2 positions shown; findings below may reference images not displayed]

FINDINGS: Scattered large and small bowel gas is noted. Postsurgical changes
are seen. No free air is noted. No obstructive changes are seen. No
bony abnormality is noted.
IMPRESSION: No acute abnormality seen.

## 2019-06-17 MED ORDER — PROCHLORPERAZINE EDISYLATE 10 MG/2ML IJ SOLN
5.0000 mg | Freq: Once | INTRAMUSCULAR | Status: AC
  Administered 2019-06-17: 5 mg via INTRAVENOUS
  Filled 2019-06-17: qty 2

## 2019-06-17 MED ORDER — SODIUM CHLORIDE 0.9 % IV BOLUS
1000.0000 mL | Freq: Once | INTRAVENOUS | Status: AC
  Administered 2019-06-17: 1000 mL via INTRAVENOUS

## 2019-06-17 MED ORDER — DIPHENHYDRAMINE HCL 50 MG/ML IJ SOLN
12.5000 mg | Freq: Once | INTRAMUSCULAR | Status: AC
  Administered 2019-06-17: 12.5 mg via INTRAVENOUS
  Filled 2019-06-17: qty 1

## 2019-06-17 MED ORDER — DICYCLOMINE HCL 10 MG PO CAPS
10.0000 mg | ORAL_CAPSULE | Freq: Once | ORAL | Status: AC
  Administered 2019-06-17: 10 mg via ORAL
  Filled 2019-06-17: qty 1

## 2019-06-17 MED ORDER — ACETAMINOPHEN 325 MG PO TABS
650.0000 mg | ORAL_TABLET | Freq: Once | ORAL | Status: AC
  Administered 2019-06-17: 650 mg via ORAL
  Filled 2019-06-17: qty 2

## 2019-06-17 NOTE — ED Provider Notes (Signed)
[facility]-EMERGENCY DEPT Provider Note   CSN: [id number] Arrival date & time: 06/17/19  0841     History Chief Complaint  Patient presents with  . Emesis  . Generalized Body Aches  . Fatigue  . Rash  . Headache    [name] [name] is a 34 y.o. female who presents emergency department with chief complaint of nausea and vomiting.  Patient states that she had her Pfizer, mRNA Covid vaccination 4 days ago.  After that she began having abdominal cramping, nausea, vomiting, body aches.  She did notice some urinary burning.  She denies loss of sense of taste or smell, known exposure to Covid, documented fevers, flank pain.  HPI     Past Medical History:  Diagnosis Date  . Anemia   . Anxiety   . DEPRESSION   . Family history of colon cancer 05/17/19   Father, 92s; deceased.   Marland Kitchen GERD (gastroesophageal reflux disease)   . Hypothyroidism    Hx of, normalized TSH during pregnancy 2011  . Infertility, female   . Migraines   . Morbid obesity (HCC)    s/p RY 08/2012 - start weight 290 pounds  . Nephrolithiasis   . PCOS (polycystic ovarian syndrome)   . PONV (postoperative nausea and vomiting)   . Pregnancy induced hypertension     Patient Active Problem List   Diagnosis Date Noted  . Vitamin B12 deficiency 06/01/2019  . Vitamin D deficiency 06/01/2019  . Panic disorder 06/01/2019  . Family history of colon cancer 05-17-19  . Chronic constipation 2019-05-17  . Major depression, recurrent, chronic (HCC) 05-17-2019  . Insomnia due to other mental disorder 03/13/2019  . Pain in both lower extremities 11/21/2018  . Nocturnal leg cramps 10/26/2018  . Nephrolithiasis   . Reduced libido, due to Lexapro 07/09/2017  . Binge eating disorder 04/23/2017  . Morbid obesity (HCC) 04/23/2017  . Acquired iron deficiency anemia due to decreased absorption 02/22/2015  . 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 Quintella Reichert OSTEOTOMY Left 06/24/2017   Procedure: Ralene Bathe;  Surgeon: [name], DPM;  Location: [facility];  Service: Podiatry;  Laterality: Left;  . BUNIONECTOMY Left 06/24/2017   Procedure: Anthoney Harada;  Surgeon: [name], DPM;  Location: [facility];  Service: Podiatry;  Laterality: Left;  . CESAREAN SECTION     x 2  . CHOLECYSTECTOMY N/A 02/15/2019   Procedure: LAPAROSCOPIC CHOLECYSTECTOMY WITH INTRAOPERATIVE CHOLANGIOGRAM;  Surgeon: Andria Meuse, MD;  Location: [facility];  Service: General;  Laterality: N/A;  . FOOT SURGERY    . 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   Procedure: LAPAROSCOPIC ROUX-EN-Y GASTRIC;  Surgeon: Atilano Ina, MD;  Location: [facility];  Service: General;  Laterality: N/A;  laparoscopic roux-en-y gastric bypass  . LITHOTRIPSY Left   . TONSILLECTOMY    . TUBAL LIGATION    . UPPER GI ENDOSCOPY  08/24/2012   Procedure: UPPER GI ENDOSCOPY;  Surgeon: Atilano Ina, MD;  Location: [facility];  Service: General;;  . WISDOM TOOTH EXTRACTION       OB History    Gravida  2   Para  2   Term  2   Preterm  0   AB  0   Living  2     SAB  0   TAB  0   Ectopic  0   Multiple  0   Live  Births  2           Family History  Problem Relation Age of Onset  . Hyperlipidemia Father   . Hypertension Father   . Kidney disease Father   . Colon cancer Father 53  . Hypertension Mother   . Hypertension Maternal Grandmother   . Uterine cancer Maternal Grandmother 85  . Diabetes Maternal Grandfather     Social History   Tobacco Use  . Smoking status: Never Smoker  . Smokeless tobacco: Never Used  Substance Use Topics  . Alcohol use: Yes    Comment: rare/socially  . Drug use: No    Home Medications Prior to Admission medications   Medication Sig Start Date End Date Taking? Authorizing Provider  acetaminophen (TYLENOL) 500 MG tablet Take 2 tablets (1,000 mg total) by mouth every 6 (six)  hours as needed for moderate pain. 02/13/19   Fayrene Helper, PA-C  ALPRAZolam Prudy Feeler) 1 MG tablet Take 1-2 tablets (1-2 mg total) by mouth at bedtime as needed for anxiety. 06/15/19   Willow Ora, MD  busPIRone (BUSPAR) 7.5 MG tablet Take 1 tablet (7.5 mg total) by mouth 3 (three) times daily as needed. 06/01/19   Willow Ora, MD  CALCIUM-MAGNESIUM-ZINC PO Take 3 tablets by mouth daily.    [provider]  cyanocobalamin (,VITAMIN B-12,) 1000 MCG/ML injection Inject 1 vial per week for 3 weeks, then 1 vial per month thereafter 05/30/19   Willow Ora, MD  diclofenac Sodium (VOLTAREN) 1 % GEL Apply 4 g topically 4 (four) times daily. 05/13/19   Palumbo, April, MD  escitalopram (LEXAPRO) 20 MG tablet Take 1 tablet (20 mg total) by mouth daily. 03/11/19   Helane Rima, DO  gabapentin (NEURONTIN) 300 MG capsule Take 1 capsule (300 mg total) by mouth 2 (two) times daily. 05/25/19   Daphine Deutscher Mary-Margaret, FNP  lamoTRIgine (LAMICTAL) 25 MG tablet Take 50 mg by mouth daily.    [provider]  Menaquinone-7 (VITAMIN K2 PO) Take 1 capsule by mouth daily.     [provider]  Multiple Vitamins-Iron (ONE-TABLET-DAILY/IRON PO) Take 1 tablet by mouth daily.    [provider]  ondansetron (ZOFRAN-ODT) 4 MG disintegrating tablet Take 1 tablet (4 mg total) by mouth every 6 (six) hours as needed for nausea. 02/16/19   Maczis, Elmer Sow, PA-C  pantoprazole (PROTONIX) 40 MG tablet Take 1 tablet (40 mg total) by mouth daily. 01/25/19   Esterwood, Amy S, PA-C  ramelteon (ROZEREM) 8 MG tablet TAKE 1 TABLET BY MOUTH AT BEDTIME 06/01/19   Willow Ora, MD  SYRINGE-NEEDLE, DISP, 3 ML 25G X 1" 3 ML MISC Use 1 needle per application once weekly 05/30/19   Willow Ora, MD  tizanidine (ZANAFLEX) 2 MG capsule Take 2 capsules (4 mg total) by mouth 3 (three) times daily. 03/11/19   Helane Rima, DO  traMADol (ULTRAM) 50 MG tablet Take 1 tablet (50 mg total) by mouth every 6 (six)  hours as needed for moderate pain. 05/16/19   Willow Ora, MD  valACYclovir (VALTREX) 1000 MG tablet Take 1 tablet (1,000 mg total) by mouth 3 (three) times daily. 05/25/19   Daphine Deutscher Mary-Margaret, FNP  Vitamin D, Ergocalciferol, (DRISDOL) 1.25 MG (50000 UT) CAPS capsule Take 50,000 Units by mouth every Monday.    [provider]    Allergies    Ambien [zolpidem] and Nsaids  Review of Systems   Review of Systems Ten systems reviewed and are negative for  acute change, except as noted in the HPI.   Physical Exam Updated Vital Signs BP 96/78   Pulse 93   Temp 97.8 F (36.6 C) (Oral)   Resp 16   Ht 5\' 4"  (1.626 m)   Wt 116.1 kg   SpO2 94%   BMI 43.94 kg/m   Physical Exam Vitals and nursing note reviewed.  Constitutional:      General: She is not in acute distress.    Appearance: She is well-developed. She is not diaphoretic.  HENT:     Head: Normocephalic and atraumatic.  Eyes:     General: No scleral icterus.    Conjunctiva/sclera: Conjunctivae normal.  Cardiovascular:     Rate and Rhythm: Normal rate and regular rhythm.     Heart sounds: Normal heart sounds. No murmur. No friction rub. No gallop.   Pulmonary:     Effort: Pulmonary effort is normal. No respiratory distress.     Breath sounds: Normal breath sounds.  Abdominal:     General: Bowel sounds are normal. There is no distension.     Palpations: Abdomen is soft. There is no mass.     Tenderness: There is abdominal tenderness (Diffuse abdominal tenderness). There is no guarding.  Musculoskeletal:     Cervical back: Normal range of motion.  Skin:    General: Skin is warm and dry.  Neurological:     Mental Status: She is alert and oriented to person, place, and time.  Psychiatric:        Behavior: Behavior normal.     ED Results / Procedures / Treatments   Labs (all labs ordered are listed, but only abnormal results are displayed) Labs Reviewed  CBC WITH DIFFERENTIAL/PLATELET - Abnormal;  Notable for the following components:      Result Value   RDW 16.9 (*)    All other components within normal limits  COMPREHENSIVE METABOLIC PANEL - Abnormal; Notable for the following components:   Calcium 8.7 (*)    Total Protein 6.4 (*)    ALT 52 (*)    Alkaline Phosphatase 165 (*)    All other components within normal limits  SARS CORONAVIRUS 2 (TAT 6-24 HRS)  LIPASE, BLOOD  URINALYSIS, ROUTINE W REFLEX MICROSCOPIC  HCG, QUANTITATIVE, PREGNANCY    EKG None  Radiology No results found.  Procedures Procedures (including critical care time)  Medications Ordered in ED Medications  sodium chloride 0.9 % bolus 1,000 mL (1,000 mLs Intravenous New Bag/Given 06/17/19 1034)  prochlorperazine (COMPAZINE) injection 5 mg (5 mg Intravenous Given 06/17/19 1034)  diphenhydrAMINE (BENADRYL) injection 12.5 mg (12.5 mg Intravenous Given 06/17/19 1036)    ED Course  I have reviewed the triage vital signs and the nursing notes.  Pertinent labs & imaging results that were available during my care of the patient were reviewed by me and considered in my medical decision making (see chart for details).  Clinical Course as of Jun 16 1328  Fri Jun 17, 2019  1147 ALT(!): 52 [AH]  1147 Alkaline Phosphatase(!): 165 [AH]    Clinical Course User Index [AH] Jun 19, 2019, PA-C   MDM Rules/Calculators/A&P                        She is here with flulike symptoms.  Patient given fluids, Compazine, Benadryl and Bentyl with complete resolution of her symptoms.  She feels much better.  I reviewed the patient's labs which show no evidence of urinary tract infection.  Negative  pregnancy test, negative lipase.  CMP shows mild hypocalcemia, low protein level which is chronic secondary to her Roux-en-Y gastric bypass.  She also has has chronically a elevated ALT and alkaline phosphatase.  No significant abnormalities on the patient's CBC.  The test is pending.  I personally reviewed the patient's 2 view  abdomen which shows no evidence of flattering suggestive of bowel obstruction.  Repeat abdominal exam shows benign nontender abdomen.  Patient appears appropriate for discharge.  I discussed return precautions.   [name] [name] was evaluated in Emergency Department on 06/17/2019 for the symptoms described in the history of present illness. She was evaluated in the context of the global COVID-19 pandemic, which necessitated consideration that the patient might be at risk for infection with the SARS-CoV-2 virus that causes COVID-19. Institutional protocols and algorithms that pertain to the evaluation of patients at risk for COVID-19 are in a state of rapid change based on information released by regulatory bodies including the CDC and federal and state organizations. These policies and algorithms were followed during the patient's care in the ED.  Final Clinical Impression(s) / ED Diagnoses Final diagnoses:  None    Rx / DC Orders ED Discharge Orders    None       Margarita Mail, PA-C 06/17/19 1549    Nat Christen, MD 06/17/19 [phone]

## 2019-06-17 NOTE — Discharge Instructions (Signed)
Please quarantine at home until your test has resulted. t. SEEK IMMEDIATE MEDICAL ATTENTION IF: The pain does not go away or becomes severe.  A temperature above 101 develops.  Repeated vomiting occurs (multiple episodes).  The pain becomes localized to portions of the abdomen. The right side could possibly be appendicitis. In an adult, the left lower portion of the abdomen could be colitis or diverticulitis.  Blood is being passed in stools or vomit (bright red or black tarry stools).  Return also if you develop chest pain, difficulty breathing, dizziness or fainting, or become confused, poorly responsive, or inconsolable (young children).

## 2019-06-17 NOTE — ED Triage Notes (Addendum)
Patient reports that she had the Covid vaccine 4 days ago. Patient states her employer sent her to be tested at [facility], but testing is closed today and was told to come to the ED by [employer].  Patient states she begin having symptoms the afternoon that she received the Covid vaccine and has gotten progressively worse. Patient c/o headache, rash, emesis, fatigue and headache.

## 2019-06-17 NOTE — ED Notes (Signed)
An After Visit Summary was printed and given to the patient. Discharge instructions given and no further questions at this time. Pt denies SOB at this time.  

## 2019-06-18 ENCOUNTER — Telehealth: Payer: No Typology Code available for payment source | Admitting: Family

## 2019-06-18 DIAGNOSIS — R112 Nausea with vomiting, unspecified: Secondary | ICD-10-CM | POA: Diagnosis not present

## 2019-06-18 MED ORDER — PROMETHAZINE HCL 25 MG PO TABS: 25.0000 mg | ORAL_TABLET | Freq: Three times a day (TID) | ORAL | 0 refills | Status: DC | PRN

## 2019-06-18 MED FILL — BUSPIRONE HCL 7.5 MG TABS: 7.5 | 20 days supply | Qty: 60 | Fill #1

## 2019-06-18 NOTE — Progress Notes (Signed)
We are sorry that you are not feeling well. Here is how we plan to help!  Based on what you have shared with me it looks like you have a Virus that is irritating your GI tract.  Vomiting is the forceful emptying of a portion of the stomach's content through the mouth.  Although nausea and vomiting can make you feel miserable, it's important to remember that these are not diseases, but rather symptoms of an underlying illness.  When we treat short term symptoms, we always caution that any symptoms that persist should be fully evaluated in a medical office.  I have prescribed a medication that will help alleviate your symptoms and allow you to stay hydrated:  Promethazine 25 mg take 1 tablet twice daily  HOME CARE:  Drink clear liquids.  This is very important! Dehydration (the lack of fluid) can lead to a serious complication.  Start off with 1 tablespoon every 5 minutes for 8 hours.  You may begin eating bland foods after 8 hours without vomiting.  Start with saltine crackers, white bread, rice, mashed potatoes, applesauce.  After 48 hours on a bland diet, you may resume a normal diet.  Try to go to sleep.  Sleep often empties the stomach and relieves the need to vomit.  GET HELP RIGHT AWAY IF:   Your symptoms do not improve or worsen within 2 days after treatment.  You have a fever for over 3 days.  You cannot keep down fluids after trying the medication.  MAKE SURE YOU:   Understand these instructions.  Will watch your condition.  Will get help right away if you are not doing well or get worse.   Thank you for choosing an e-visit. Your e-visit answers were reviewed by a board certified advanced clinical practitioner to complete your personal care plan. Depending upon the condition, your plan could have included both over the counter or prescription medications. Please review your pharmacy choice. Be sure that the pharmacy you have chosen is open so that you can pick up your  prescription now.  If there is a problem you may message your provider in MyChart to have the prescription routed to another pharmacy. Your safety is important to us. If you have drug allergies check your prescription carefully.  For the next 24 hours, you can use MyChart to ask questions about today's visit, request a non-urgent call back, or ask for a work or school excuse from your e-visit provider. You will get an e-mail in the next two days asking about your experience. I hope that your e-visit has been valuable and will speed your recovery.  Greater than 5 minutes, yet less than 10 minutes of time have been spent researching, coordinating, and implementing care for this patient today.  Thank you for the details you included in the comment boxes. Those details are very helpful in determining the best course of treatment for you and help us to provide the best care.   

## 2019-06-21 ENCOUNTER — Encounter (HOSPITAL_BASED_OUTPATIENT_CLINIC_OR_DEPARTMENT_OTHER): Payer: Self-pay | Admitting: *Deleted

## 2019-06-21 ENCOUNTER — Emergency Department (HOSPITAL_BASED_OUTPATIENT_CLINIC_OR_DEPARTMENT_OTHER)
Admission: EM | Admit: 2019-06-21 | Discharge: 2019-06-21 | Disposition: A | Discharge status: Home / Self Care | Payer: No Typology Code available for payment source | Attending: Emergency Medicine | Admitting: Emergency Medicine

## 2019-06-21 ENCOUNTER — Emergency Department
Admission: EM | Admit: 2019-06-21 | Discharge: 2019-06-21 | Disposition: A | Discharge status: Home / Self Care | Payer: No Typology Code available for payment source | Source: Home / Self Care

## 2019-06-21 ENCOUNTER — Ambulatory Visit (INDEPENDENT_AMBULATORY_CARE_PROVIDER_SITE_OTHER): Payer: No Typology Code available for payment source | Admitting: Psychology

## 2019-06-21 ENCOUNTER — Encounter: Payer: Self-pay | Admitting: Family Medicine

## 2019-06-21 ENCOUNTER — Other Ambulatory Visit: Payer: Self-pay

## 2019-06-21 DIAGNOSIS — R112 Nausea with vomiting, unspecified: Secondary | ICD-10-CM | POA: Diagnosis not present

## 2019-06-21 DIAGNOSIS — R109 Unspecified abdominal pain: Secondary | ICD-10-CM | POA: Insufficient documentation

## 2019-06-21 DIAGNOSIS — F5101 Primary insomnia: Secondary | ICD-10-CM

## 2019-06-21 DIAGNOSIS — F4322 Adjustment disorder with anxiety: Secondary | ICD-10-CM | POA: Diagnosis not present

## 2019-06-21 DIAGNOSIS — Z5321 Procedure and treatment not carried out due to patient leaving prior to being seen by health care provider: Secondary | ICD-10-CM | POA: Diagnosis not present

## 2019-06-21 LAB — POCT URINALYSIS DIP (MANUAL ENTRY)
Bilirubin, UA: NEGATIVE
Glucose, UA: NEGATIVE mg/dL
Ketones, POC UA: NEGATIVE mg/dL
Leukocytes, UA: NEGATIVE
Nitrite, UA: NEGATIVE
Protein Ur, POC: NEGATIVE mg/dL
Spec Grav, UA: >=1.03 — AB (ref 1.010–1.025)
Urobilinogen, UA: 0.2 E.U./dL
pH, UA: 5 (ref 5.0–8.0)

## 2019-06-21 LAB — POCT URINE PREGNANCY: Preg Test, Ur: NEGATIVE

## 2019-06-21 MED ORDER — TAMSULOSIN HCL 0.4 MG PO CAPS: 0.4000 mg | ORAL_CAPSULE | Freq: Every day | ORAL | 0 refills | Status: AC

## 2019-06-21 MED ORDER — PROMETHAZINE HCL 25 MG/ML IJ SOLN
25.0000 mg | Freq: Once | INTRAMUSCULAR | Status: AC
  Administered 2019-06-21: 19:00:00 25 mg via INTRAMUSCULAR

## 2019-06-21 MED ORDER — HYDROCODONE-ACETAMINOPHEN 5-325 MG PO TABS: 1.0000 | ORAL_TABLET | Freq: Four times a day (QID) | ORAL | 0 refills | Status: DC | PRN

## 2019-06-21 MED ORDER — KETOROLAC TROMETHAMINE 60 MG/2ML IM SOLN
60.0000 mg | Freq: Once | INTRAMUSCULAR | Status: AC
  Administered 2019-06-21: 20:00:00 60 mg via INTRAMUSCULAR

## 2019-06-21 MED ORDER — SODIUM CHLORIDE 0.9 % IV BOLUS
1000.0000 mL | Freq: Once | INTRAVENOUS | Status: AC
  Administered 2019-06-21: 19:00:00 1000 mL via INTRAVENOUS

## 2019-06-21 MED ORDER — METOCLOPRAMIDE HCL 5 MG/ML IJ SOLN: 10.0000 mg | Freq: Once | INTRAMUSCULAR | Status: DC

## 2019-06-21 NOTE — ED Provider Notes (Signed)
[facility]    CSN: [id number] Arrival date & time: 06/21/19  1808      History   Chief Complaint Chief Complaint  Patient presents with  . Nausea  . Emesis    HPI [name] [name] is a 34 y.o. female.   34 year old female, history of anemia, anxiety, depression, hypothyroidism, migraines, obesity, status post Roux-en-Y gastric bypass in 2014, kidney stones, presenting today complaining of left flank pain with nausea and vomiting.  Patient states that she received your Covid vaccine 12/28.  States that she has had vomiting since that time.  Was seen in the emergency room 1/1 and had a negative Covid swab done at that time.  States that she was given fluids and IV antiemetics and was feeling much better.  However, symptoms have recurred in the past few days.  Today, she developed left flank pain with radiation to the left lower quadrant.  States that this feels the same as her previous kidney stones.  She has no fever or chills. No dysuria, hematuria  The history is provided by the patient.  Emesis Severity:  Moderate Duration:  1 week Timing:  Intermittent Quality:  Stomach contents Able to tolerate:  Liquids Progression:  Unchanged Chronicity:  New Recent urination:  Normal Relieved by:  Nothing Worsened by:  Nothing Ineffective treatments:  Antiemetics Associated symptoms: abdominal pain   Associated symptoms: no arthralgias, no chills, no cough, no diarrhea, no fever, no headaches, no myalgias, no sore throat and no URI   Risk factors: prior abdominal surgery   Risk factors: no alcohol use, no diabetes, not pregnant, no suspect food intake and no travel to endemic areas     Past Medical History:  Diagnosis Date  . Anemia   . Anxiety   . DEPRESSION   . Family history of colon cancer 06/12/2019   Father, 28s; deceased.   Marland Kitchen GERD (gastroesophageal reflux disease)   . Hypothyroidism    Hx of, normalized TSH during pregnancy 2011  . Infertility, female     . Migraines   . Morbid obesity (HCC)    s/p RY 08/2012 - start weight 290 pounds  . Nephrolithiasis   . PCOS (polycystic ovarian syndrome)   . PONV (postoperative nausea and vomiting)   . Pregnancy induced hypertension     Patient Active Problem List   Diagnosis Date Noted  . Vitamin B12 deficiency 06/01/2019  . Vitamin D deficiency 06/01/2019  . Panic disorder 06/01/2019  . Family history of colon cancer 06-12-19  . Chronic constipation June 12, 2019  . Major depression, recurrent, chronic (HCC) 06-12-19  . Insomnia due to other mental disorder 03/13/2019  . Pain in both lower extremities 11/21/2018  . Nocturnal leg cramps 10/26/2018  . Nephrolithiasis   . Reduced libido, due to Lexapro 07/09/2017  . Binge eating disorder 04/23/2017  . Morbid obesity (HCC) 04/23/2017  . Acquired iron deficiency anemia due to decreased absorption 02/22/2015  . 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 Quintella Reichert OSTEOTOMY Left 06/24/2017   Procedure: Ralene Bathe;  Surgeon: [name], DPM;  Location: [facility];  Service: Podiatry;  Laterality: Left;  . BUNIONECTOMY Left 06/24/2017   Procedure: Anthoney Harada;  Surgeon: [name], DPM;  Location: [facility];  Service: Podiatry;  Laterality: Left;  . CESAREAN SECTION     x 2  . CHOLECYSTECTOMY N/A 02/15/2019   Procedure: LAPAROSCOPIC CHOLECYSTECTOMY WITH INTRAOPERATIVE  CHOLANGIOGRAM;  Surgeon: Ileana Roup, MD;  Location: [facility];  Service: General;  Laterality: N/A;  . FOOT SURGERY    . 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   Procedure: LAPAROSCOPIC ROUX-EN-Y GASTRIC;  Surgeon: Gayland Curry, MD;  Location: [facility];  Service: General;  Laterality: N/A;  laparoscopic roux-en-y gastric bypass  . LITHOTRIPSY Left   . TONSILLECTOMY    . TUBAL LIGATION    . UPPER GI ENDOSCOPY  08/24/2012   Procedure: UPPER GI ENDOSCOPY;  Surgeon: Gayland Curry, MD;  Location: [facility];  Service: General;;  . WISDOM TOOTH EXTRACTION      OB History    Gravida  2   Para  2   Term  2   Preterm  0   AB  0   Living  2     SAB  0   TAB  0   Ectopic  0   Multiple  0   Live Births  2            Home Medications    Prior to Admission medications   Medication Sig Start Date End Date Taking? Authorizing Provider  acetaminophen (TYLENOL) 500 MG tablet Take 2 tablets (1,000 mg total) by mouth every 6 (six) hours as needed for moderate pain. 02/13/19   Domenic Moras, PA-C  ALPRAZolam Duanne Moron) 1 MG tablet Take 1-2 tablets (1-2 mg total) by mouth at bedtime as needed for anxiety. 06/15/19   Leamon Arnt, MD  busPIRone (BUSPAR) 7.5 MG tablet Take 1 tablet (7.5 mg total) by mouth 3 (three) times daily as needed. 06/01/19   Leamon Arnt, MD  CALCIUM-MAGNESIUM-ZINC PO Take 3 tablets by mouth daily.    [provider]  cyanocobalamin (,VITAMIN B-12,) 1000 MCG/ML injection Inject 1 vial per week for 3 weeks, then 1 vial per month thereafter 05/30/19   Leamon Arnt, MD  diclofenac Sodium (VOLTAREN) 1 % GEL Apply 4 g topically 4 (four) times daily. 05/13/19   Palumbo, April, MD  escitalopram (LEXAPRO) 20 MG tablet Take 1 tablet (20 mg total) by mouth daily. 03/11/19   Briscoe Deutscher, DO  gabapentin (NEURONTIN) 300 MG capsule Take 1 capsule (300 mg total) by mouth 2 (two) times daily. 05/25/19   Hassell Done Mary-Margaret, FNP  HYDROcodone-acetaminophen (NORCO/VICODIN) 5-325 MG tablet Take 1 tablet by mouth every 6 (six) hours as needed. 06/21/19   Anwitha Mapes C, PA-C  lamoTRIgine (LAMICTAL) 25 MG tablet Take 50 mg by mouth daily.    [provider]  Menaquinone-7 (VITAMIN K2 PO) Take 1 capsule by mouth daily.     [provider]  Multiple Vitamins-Iron (ONE-TABLET-DAILY/IRON PO) Take 1 tablet by mouth daily.    [provider]  ondansetron (ZOFRAN-ODT) 4 MG disintegrating tablet Take 1 tablet (4 mg total) by  mouth every 6 (six) hours as needed for nausea. 02/16/19   Maczis, Barth Kirks, PA-C  pantoprazole (PROTONIX) 40 MG tablet Take 1 tablet (40 mg total) by mouth daily. 01/25/19   Esterwood, Amy S, PA-C  promethazine (PHENERGAN) 25 MG tablet Take 1 tablet (25 mg total) by mouth every 8 (eight) hours as needed for nausea or vomiting. 06/18/19   Dutch Quint B, FNP  ramelteon (ROZEREM) 8 MG tablet TAKE 1 TABLET BY MOUTH AT BEDTIME 06/01/19   Leamon Arnt, MD  SYRINGE-NEEDLE, DISP, 3 ML 25G X 1" 3 ML MISC Use 1 needle per application once weekly 05/30/19   Leamon Arnt, MD  tamsulosin (FLOMAX) 0.4 MG CAPS capsule Take 1 capsule (0.4 mg total) by mouth daily for 10 days. 06/21/19 07/01/19  Pascual Mantel C, PA-C  tizanidine (ZANAFLEX) 2 MG capsule Take 2 capsules (4 mg total) by mouth 3 (three) times daily. 03/11/19   Helane Rima, DO  traMADol (ULTRAM) 50 MG tablet Take 1 tablet (50 mg total) by mouth every 6 (six) hours as needed for moderate pain. 05/16/19   Willow Ora, MD  valACYclovir (VALTREX) 1000 MG tablet Take 1 tablet (1,000 mg total) by mouth 3 (three) times daily. 05/25/19   Daphine Deutscher Mary-Margaret, FNP  Vitamin D, Ergocalciferol, (DRISDOL) 1.25 MG (50000 UT) CAPS capsule Take 50,000 Units by mouth every Monday.    [provider]    Family History Family History  Problem Relation Age of Onset  . Hyperlipidemia Father   . Hypertension Father   . Kidney disease Father   . Colon cancer Father 72  . Hypertension Mother   . Hypertension Maternal Grandmother   . Uterine cancer Maternal Grandmother 46  . Diabetes Maternal Grandfather     Social History Social History   Tobacco Use  . Smoking status: Never Smoker  . Smokeless tobacco: Never Used  Substance Use Topics  . Alcohol use: Yes    Comment: rare/socially  . Drug use: No     Allergies   Ambien [zolpidem] and Nsaids   Review of Systems Review of Systems  Constitutional: Negative for chills and fever.  HENT:  Negative for ear pain and sore throat.   Eyes: Negative for pain and visual disturbance.  Respiratory: Negative for cough and shortness of breath.   Cardiovascular: Negative for chest pain and palpitations.  Gastrointestinal: Positive for abdominal pain, nausea and vomiting. Negative for diarrhea.  Genitourinary: Positive for flank pain. Negative for dysuria and hematuria.  Musculoskeletal: Negative for arthralgias, back pain and myalgias.  Skin: Negative for color change and rash.  Neurological: Negative for seizures, syncope and headaches.  All other systems reviewed and are negative.    Physical Exam Triage Vital Signs ED Triage Vitals  Enc Vitals Group     BP 06/21/19 1829 124/87     Pulse Rate 06/21/19 1829 88     Resp 06/21/19 1829 20     Temp 06/21/19 1829 98.1 F (36.7 C)     Temp Source 06/21/19 1829 Oral     SpO2 06/21/19 1829 98 %     Weight 06/21/19 1831 262 lb (118.8 kg)     Height 06/21/19 1831 5\' 4"  (1.626 m)     Head Circumference --      Peak Flow --      Pain Score 06/21/19 1830 9     Pain Loc --      Pain Edu? --      Excl. in GC? --    No data found.  Updated Vital Signs BP 124/87 (BP Location: Right Arm)   Pulse 88   Temp 98.1 F (36.7 C) (Oral)   Resp 20   Ht 5\' 4"  (1.626 m)   Wt 262 lb (118.8 kg)   SpO2 98%   BMI 44.97 kg/m   Visual Acuity Right Eye Distance:   Left Eye Distance:   Bilateral Distance:    Right Eye Near:   Left Eye Near:    Bilateral Near:     Physical Exam Vitals and nursing note reviewed.  Constitutional:      General: She is not in acute distress.  Appearance: She is well-developed.  HENT:     Head: Normocephalic and atraumatic.  Eyes:     Conjunctiva/sclera: Conjunctivae normal.  Cardiovascular:     Rate and Rhythm: Normal rate and regular rhythm.     Heart sounds: No murmur.  Pulmonary:     Effort: Pulmonary effort is normal. No respiratory distress.     Breath sounds: Normal breath sounds.    Abdominal:     Palpations: Abdomen is soft.     Tenderness: There is generalized abdominal tenderness. There is left CVA tenderness.  Musculoskeletal:     Cervical back: Neck supple.  Skin:    General: Skin is warm and dry.  Neurological:     Mental Status: She is alert.      UC Treatments / Results  Labs (all labs ordered are listed, but only abnormal results are displayed) Labs Reviewed  POCT URINALYSIS DIP (MANUAL ENTRY) - Abnormal; Notable for the following components:      Result Value   Spec Grav, UA >=1.030 (*)    Blood, UA trace-intact (*)    All other components within normal limits  POCT URINE PREGNANCY    EKG   Radiology No results found.  Procedures Procedures (including critical care time)  Medications Ordered in UC Medications  ketorolac (TORADOL) injection 60 mg (has no administration in time range)  promethazine (PHENERGAN) injection 25 mg (25 mg Intramuscular Given 06/21/19 1916)  sodium chloride 0.9 % bolus 1,000 mL (0 mLs Intravenous Stopped 06/21/19 1952)    Initial Impression / Assessment and Plan / UC Course  I have reviewed the triage vital signs and the nursing notes.  Pertinent labs & imaging results that were available during my care of the patient were reviewed by me and considered in my medical decision making (see chart for details).     Ongoing nausea and vomiting with generalized abdominal pain and left leg pain.  Patient states that this feels the same as her previous kidney stones.  Left flank pain that radiates to left lower quadrant.  IV fluids and antiemetics and reevaluate. Patient does have some microscopic hematuria but no evidence of UTI. Patient states the pain seems to be causing the nausea. She is unable to take oral NSAIDs due to previous gastric bypass but it able to have IM toradol Patient able to tolerate po after fluids after antiemetics She has zofran and phenergan at home  Will treat possible kidney stone with pain  meds and flomax She will follow-up with urology Return precautions discussed Final Clinical Impressions(s) / UC Diagnoses   Final diagnoses:  Flank pain  Nausea and vomiting, intractability of vomiting not specified, unspecified vomiting type   Discharge Instructions   None    ED Prescriptions    Medication Sig Dispense Auth. Provider   HYDROcodone-acetaminophen (NORCO/VICODIN) 5-325 MG tablet Take 1 tablet by mouth every 6 (six) hours as needed. 12 tablet Parris Cudworth C, PA-C   tamsulosin (FLOMAX) 0.4 MG CAPS capsule Take 1 capsule (0.4 mg total) by mouth daily for 10 days. 30 capsule Zakir Henner C, PA-C     I have reviewed the PDMP during this encounter.   Alecia Lemming, [place] 06/21/19 [phone]

## 2019-06-21 NOTE — ED Triage Notes (Signed)
Pt has had nausea and vomiting since the 28th.  Had vaccine on the 28.  Can not keep fluids dowtodayn.  Has tried Zofran and phenergan.  Neither is working at this point. Last emesis was 2 pm  Left lower back pain

## 2019-06-21 NOTE — ED Triage Notes (Signed)
C/o cont abd pain x 5 days , seen in [facility] ED for same 1/1

## 2019-06-27 MED FILL — BUSPIRONE HCL 7.5 MG TABS: 7.5 | 20 days supply | Qty: 60 | Fill #1

## 2019-07-01 ENCOUNTER — Other Ambulatory Visit: Payer: Self-pay

## 2019-07-01 ENCOUNTER — Emergency Department
Admission: EM | Admit: 2019-07-01 | Discharge: 2019-07-01 | Disposition: A | Discharge status: Home / Self Care | Payer: No Typology Code available for payment source | Source: Home / Self Care | Attending: Family Medicine | Admitting: Family Medicine

## 2019-07-01 DIAGNOSIS — R109 Unspecified abdominal pain: Secondary | ICD-10-CM

## 2019-07-01 DIAGNOSIS — N200 Calculus of kidney: Secondary | ICD-10-CM | POA: Diagnosis not present

## 2019-07-01 LAB — POCT URINALYSIS DIP (MANUAL ENTRY)
Glucose, UA: NEGATIVE mg/dL
Ketones, POC UA: NEGATIVE mg/dL
Leukocytes, UA: NEGATIVE
Nitrite, UA: NEGATIVE
Protein Ur, POC: 30 mg/dL — AB
Spec Grav, UA: >=1.03 — AB (ref 1.010–1.025)
Urobilinogen, UA: 0.2 E.U./dL
pH, UA: 5.5 (ref 5.0–8.0)

## 2019-07-01 MED ORDER — HYDROCODONE-ACETAMINOPHEN 5-325 MG PO TABS: ORAL_TABLET | ORAL | 0 refills | Status: DC

## 2019-07-01 MED ORDER — ONDANSETRON HCL 4 MG/2ML IJ SOLN
4.0000 mg | Freq: Once | INTRAMUSCULAR | Status: AC
  Administered 2019-07-01: 4 mg via INTRAMUSCULAR

## 2019-07-01 MED ORDER — KETOROLAC TROMETHAMINE 60 MG/2ML IM SOLN
60.0000 mg | Freq: Once | INTRAMUSCULAR | Status: AC
  Administered 2019-07-01: 20:00:00 60 mg via INTRAMUSCULAR

## 2019-07-01 NOTE — ED Provider Notes (Signed)
[facility]    CSN: [id number] Arrival date & time: 07/01/19  1731      History   Chief Complaint Chief Complaint  Patient presents with  . Nephrolithiasis    HPI [name] [name] is a 34 y.o. female.   Patient has a history of nephrolithiasis and lithotripsy 2012.  She was seen 10 days ago for left flank pain, nausea, and possible kidney stone (see note dated 06/21/19).  She was treated with IV fluids, promethazine, Toradol, and discharged with Flomax and Vicodin.  She improved, but last night developed recurrent left flank pain and nausea.  She denies vomiting and fever.  The history is provided by the patient.  Flank Pain This is a recurrent problem. The current episode started yesterday. The problem occurs constantly. The problem has not changed since onset.Pertinent negatives include no abdominal pain and no headaches. Nothing aggravates the symptoms. Nothing relieves the symptoms. Treatments tried: Flomax. The treatment provided mild relief.    Past Medical History:  Diagnosis Date  . Anemia   . Anxiety   . DEPRESSION   . Family history of colon cancer 21-May-2019   Father, 36s; deceased.   Marland Kitchen GERD (gastroesophageal reflux disease)   . Hypothyroidism    Hx of, normalized TSH during pregnancy 2011  . Infertility, female   . Migraines   . Morbid obesity ([place])    s/p RY 08/2012 - start weight 290 pounds  . Nephrolithiasis   . PCOS (polycystic ovarian syndrome)   . PONV (postoperative nausea and vomiting)   . Pregnancy induced hypertension     Patient Active Problem List   Diagnosis Date Noted  . Vitamin B12 deficiency 06/01/2019  . Vitamin D deficiency 06/01/2019  . Panic disorder 06/01/2019  . Family history of colon cancer 05/21/19  . Chronic constipation May 21, 2019  . Major depression, recurrent, chronic ([place]) 05-21-2019  . Insomnia due to other mental disorder 03/13/2019  . Pain in both lower extremities 11/21/2018  . Nocturnal leg cramps  10/26/2018  . Nephrolithiasis   . Reduced libido, due to Lexapro 07/09/2017  . Binge eating disorder 04/23/2017  . Morbid obesity ([place]) 04/23/2017  . Acquired iron deficiency anemia due to decreased absorption 02/22/2015  . 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 Barbie Banner OSTEOTOMY Left 06/24/2017   Procedure: [employer];  Surgeon: Trula Slade, DPM;  Location: [facility];  Service: Podiatry;  Laterality: Left;  . BUNIONECTOMY Left 06/24/2017   Procedure: Annye English;  Surgeon: Trula Slade, DPM;  Location: [facility];  Service: Podiatry;  Laterality: Left;  . CESAREAN SECTION     x 2  . CHOLECYSTECTOMY N/A 02/15/2019   Procedure: LAPAROSCOPIC CHOLECYSTECTOMY WITH INTRAOPERATIVE CHOLANGIOGRAM;  Surgeon: Ileana Roup, MD;  Location: [facility];  Service: General;  Laterality: N/A;  . FOOT SURGERY    . 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   Procedure: LAPAROSCOPIC ROUX-EN-Y GASTRIC;  Surgeon: Gayland Curry, MD;  Location: [facility];  Service: General;  Laterality: N/A;  laparoscopic roux-en-y gastric bypass  . LITHOTRIPSY Left   . TONSILLECTOMY    . TUBAL LIGATION    . UPPER GI ENDOSCOPY  08/24/2012   Procedure: UPPER GI ENDOSCOPY;  Surgeon: Gayland Curry, MD;  Location[facility];  Service: General;;  . WISDOM TOOTH EXTRACTION      OB History    Gravida  2   Para  2  Term  2   Preterm  0   AB  0   Living  2     SAB  0   TAB  0   Ectopic  0   Multiple  0   Live Births  2            Home Medications    Prior to Admission medications   Medication Sig Start Date End Date Taking? Authorizing Provider  acetaminophen (TYLENOL) 500 MG tablet Take 2 tablets (1,000 mg total) by mouth every 6 (six) hours as needed for moderate pain. 02/13/19   Fayrene Helper, PA-C  ALPRAZolam Prudy Feeler) 1 MG tablet Take 1-2 tablets (1-2 mg total) by mouth at bedtime as needed for  anxiety. 06/15/19   Willow Ora, MD  busPIRone (BUSPAR) 7.5 MG tablet Take 1 tablet (7.5 mg total) by mouth 3 (three) times daily as needed. 06/01/19   Willow Ora, MD  CALCIUM-MAGNESIUM-ZINC PO Take 3 tablets by mouth daily.    [provider]  cyanocobalamin (,VITAMIN B-12,) 1000 MCG/ML injection Inject 1 vial per week for 3 weeks, then 1 vial per month thereafter 05/30/19   Willow Ora, MD  diclofenac Sodium (VOLTAREN) 1 % GEL Apply 4 g topically 4 (four) times daily. 05/13/19   Palumbo, April, MD  escitalopram (LEXAPRO) 20 MG tablet Take 1 tablet (20 mg total) by mouth daily. 03/11/19   Helane Rima, DO  gabapentin (NEURONTIN) 300 MG capsule Take 1 capsule (300 mg total) by mouth 2 (two) times daily. 05/25/19   Bennie Pierini, FNP  HYDROcodone-acetaminophen (NORCO/VICODIN) 5-325 MG tablet Take one tab PO HS prn pain 07/01/19   Lattie Haw, MD  lamoTRIgine (LAMICTAL) 25 MG tablet Take 50 mg by mouth daily.    [provider]  Menaquinone-7 (VITAMIN K2 PO) Take 1 capsule by mouth daily.     [provider]  Multiple Vitamins-Iron (ONE-TABLET-DAILY/IRON PO) Take 1 tablet by mouth daily.    [provider]  ondansetron (ZOFRAN-ODT) 4 MG disintegrating tablet Take 1 tablet (4 mg total) by mouth every 6 (six) hours as needed for nausea. 02/16/19   Maczis, [place] Sow, PA-C  pantoprazole (PROTONIX) 40 MG tablet Take 1 tablet (40 mg total) by mouth daily. 01/25/19   Esterwood, Amy S, PA-C  promethazine (PHENERGAN) 25 MG tablet Take 1 tablet (25 mg total) by mouth every 8 (eight) hours as needed for nausea or vomiting. 06/18/19   Worthy Rancher B, FNP  ramelteon (ROZEREM) 8 MG tablet TAKE 1 TABLET BY MOUTH AT BEDTIME 06/01/19   Willow Ora, MD  SYRINGE-NEEDLE, DISP, 3 ML 25G X 1" 3 ML MISC Use 1 needle per application once weekly 05/30/19   Willow Ora, MD  tamsulosin (FLOMAX) 0.4 MG CAPS capsule Take 1 capsule (0.4 mg total) by mouth daily for 10  days. 06/21/19 07/01/19  Blue, Olivia C, PA-C  tizanidine (ZANAFLEX) 2 MG capsule Take 2 capsules (4 mg total) by mouth 3 (three) times daily. 03/11/19   Helane Rima, DO  traMADol (ULTRAM) 50 MG tablet Take 1 tablet (50 mg total) by mouth every 6 (six) hours as needed for moderate pain. 05/16/19   Willow Ora, MD  valACYclovir (VALTREX) 1000 MG tablet Take 1 tablet (1,000 mg total) by mouth 3 (three) times daily. 05/25/19   Daphine Deutscher Mary-Margaret, FNP  Vitamin D, Ergocalciferol, (DRISDOL) 1.25 MG (50000 UT) CAPS capsule Take 50,000 Units by mouth every Monday.    [provider]  Family History Family History  Problem Relation Age of Onset  . Hyperlipidemia Father   . Hypertension Father   . Kidney disease Father   . Colon cancer Father 44  . Hypertension Mother   . Hypertension Maternal Grandmother   . Uterine cancer Maternal Grandmother 19  . Diabetes Maternal Grandfather     Social History Social History   Tobacco Use  . Smoking status: Never Smoker  . Smokeless tobacco: Never Used  Substance Use Topics  . Alcohol use: Yes    Comment: rare/socially  . Drug use: No     Allergies   Ambien [zolpidem] and Nsaids   Review of Systems Review of Systems  Constitutional: Positive for activity change and appetite change. Negative for chills, diaphoresis, fatigue and fever.  HENT: Negative.   Eyes: Negative.   Respiratory: Negative.   Cardiovascular: Negative.   Gastrointestinal: Negative for abdominal pain.  Genitourinary: Positive for flank pain and frequency. Negative for dysuria, pelvic pain and urgency.  Neurological: Negative for headaches.     Physical Exam Triage Vital Signs ED Triage Vitals  Enc Vitals Group     BP 07/01/19 1811 133/90     Pulse Rate 07/01/19 1811 73     Resp --      Temp 07/01/19 1811 98.5 F (36.9 C)     Temp Source 07/01/19 1811 Oral     SpO2 07/01/19 1811 99 %     Weight 07/01/19 1813 257 lb (116.6 kg)     Height 07/01/19  1813 5\' 4"  (1.626 m)     Head Circumference --      Peak Flow --      Pain Score 07/01/19 1812 7     Pain Loc --      Pain Edu? --      Excl. in GC? --    No data found.  Updated Vital Signs BP 133/90 (BP Location: Right Arm)   Pulse 73   Temp 98.5 F (36.9 C) (Oral)   Ht 5\' 4"  (1.626 m)   Wt 116.6 kg   SpO2 99%   BMI 44.11 kg/m   Visual Acuity Right Eye Distance:   Left Eye Distance:   Bilateral Distance:    Right Eye Near:   Left Eye Near:    Bilateral Near:     Physical Exam Nursing notes and Vital Signs reviewed. Appearance:  Patient appears stated age, and in no acute distress.    Eyes:  Pupils are equal, round, and reactive to light and accomodation.  Extraocular movement is intact.  Conjunctivae are not inflamed   Pharynx:  Normal; moist mucous membranes  Neck:  Supple.  No adenopathy Lungs:  Clear to auscultation.  Breath sounds are equal.  Moving air well. Heart:  Regular rate and rhythm without murmurs, rubs, or gallops.  Abdomen:  Nontender without masses or hepatosplenomegaly.  Bowel sounds are present.  Left fank tenderness is present.  Extremities:  No edema.  Skin:  No rash present.     UC Treatments / Results  Labs (all labs ordered are listed, but only abnormal results are displayed) Labs Reviewed  POCT URINALYSIS DIP (MANUAL ENTRY) - Abnormal; Notable for the following components:      Result Value   Bilirubin, UA small (*)    Spec Grav, UA >=1.030 (*)    Blood, UA trace-intact (*)    Protein Ur, POC =30 (*)    All other components within normal limits    EKG  Radiology No results found.  Procedures Procedures (including critical care time)  Medications Ordered in UC Medications  ondansetron (ZOFRAN) injection 4 mg (has no administration in time range)  ketorolac (TORADOL) injection 60 mg (has no administration in time range)    Initial Impression / Assessment and Plan / UC Course  I have reviewed the triage vital signs and the  nursing notes.  Pertinent labs & imaging results that were available during my care of the patient were reviewed by me and considered in my medical decision making (see chart for details).    ?recurrent nephrolithiasis.  Patient declined referral for CT renal tonight. Administered Toradol 60mg  IM. Administered Zofran 4mg  IM Rx for Lortab at bedtime (#5, no refill). Controlled Substance Prescriptions I have consulted the [place] Controlled Substances Registry for this patient, and feel the risk/benefit ratio today is favorable for proceeding with this prescription for a controlled substance.  If symptoms become significantly worse during the night or over the weekend, proceed to the local emergency room.    Final Clinical Impressions(s) / UC Diagnoses   Final diagnoses:  Nephrolithiasis  Acute left flank pain     Discharge Instructions     Continue Flomax.  Take Zofran as needed for nausea.  Increase fluid intake.    ED Prescriptions    Medication Sig Dispense Auth. Provider   HYDROcodone-acetaminophen (NORCO/VICODIN) 5-325 MG tablet Take one tab PO HS prn pain 5 tablet , MD        , MD 07/04/19 [phone]

## 2019-07-01 NOTE — ED Triage Notes (Signed)
Kidney stone pain left flank since 06/21/2019, 7/10, nausea continues

## 2019-07-01 NOTE — Discharge Instructions (Addendum)
Continue Flomax.  Take Zofran as needed for nausea.  Increase fluid intake.

## 2019-07-04 ENCOUNTER — Telehealth: Payer: No Typology Code available for payment source | Admitting: Physician Assistant

## 2019-07-04 DIAGNOSIS — M545 Low back pain, unspecified: Secondary | ICD-10-CM

## 2019-07-04 NOTE — Progress Notes (Signed)
Good afternoon [name],   I'm sorry you are feeling so bad.  Unfortunately your situation is too complex for an e-visit and we do not prescribe pain medications through e-visits. You will need to go to the Emergency Department for evaluation and treatment.   Based on what you shared with me, I feel your condition warrants further evaluation and I recommend that you be seen for a face to face office visit.   NOTE: If you entered your credit card information for this eVisit, you will not be charged. You may see a "hold" on your card for the $35 but that hold will drop off and you will not have a charge processed.   If you are having a true medical emergency please call 911.      For an urgent face to face visit, [facility] has five urgent care centers for your convenience:      NEW:  [facility] at [facility] Directions [phone] [address] [place], [place] [address] . 10 am - 6pm Monday - Friday    [facility]) Get Driving Directions [phone] [address] [place], [place] [address] . 10 am to 8 pm Monday-Friday . 12 pm to 8 pm [facility] Urgent Care at [facility] Get Driving Directions [phone] [address], [address] [place], [place] [address] . 8 am to 8 pm Monday-Friday . 9 am to 6 pm Saturday . 11 am to 6 pm Sunday     [facility] at [facility] Get Driving Directions  [phone] [address] [place], [place] [address] . 8 am to 8 pm Monday-Friday . 8 am to 4 pm [facility]gent Care at [facility] Directions [phone] [address] [place], [place] [address] . 12 pm to 6 pm Monday-Friday      Your e-visit answers were reviewed by a board certified advanced clinical practitioner to complete your personal care plan.  Thank you for using e-Visits.

## 2019-07-05 ENCOUNTER — Ambulatory Visit (INDEPENDENT_AMBULATORY_CARE_PROVIDER_SITE_OTHER): Payer: No Typology Code available for payment source | Admitting: Physician Assistant

## 2019-07-05 ENCOUNTER — Other Ambulatory Visit: Payer: Self-pay

## 2019-07-05 ENCOUNTER — Encounter: Payer: Self-pay | Admitting: Physician Assistant

## 2019-07-05 ENCOUNTER — Ambulatory Visit: Payer: No Typology Code available for payment source | Admitting: Family Medicine

## 2019-07-05 ENCOUNTER — Encounter: Payer: Self-pay | Admitting: Family Medicine

## 2019-07-05 ENCOUNTER — Ambulatory Visit: Payer: No Typology Code available for payment source | Admitting: Psychology

## 2019-07-05 VITALS — BP 140/100 | HR 90 | Ht 64.0 in | Wt 262.0 lb

## 2019-07-05 VITALS — BP 140/100 | HR 90 | Temp 98.4°F | Ht 64.0 in | Wt 262.2 lb

## 2019-07-05 DIAGNOSIS — M5442 Lumbago with sciatica, left side: Secondary | ICD-10-CM

## 2019-07-05 MED ORDER — HYDROCODONE-ACETAMINOPHEN 5-325 MG PO TABS: 1.0000 | ORAL_TABLET | Freq: Four times a day (QID) | ORAL | 0 refills | Status: DC | PRN

## 2019-07-05 MED ORDER — PREDNISONE 50 MG PO TABS: 50.0000 mg | ORAL_TABLET | Freq: Every day | ORAL | 0 refills | Status: DC

## 2019-07-05 MED ORDER — GABAPENTIN 300 MG PO CAPS: ORAL_CAPSULE | ORAL | 3 refills | Status: DC

## 2019-07-05 MED FILL — GABAPENTIN 300 MG CAPSULE: 300 | 74 days supply | Qty: 180 | Fill #0

## 2019-07-05 MED FILL — predniSONE 50 MG TABS: 50 | 5 days supply | Qty: 5 | Fill #0

## 2019-07-05 MED FILL — HYDROCODON-APAP 5-325: 5-325 | 3 days supply | Qty: 15 | Fill #0

## 2019-07-05 NOTE — Progress Notes (Signed)
Subjective:    I'm seeing this patient as a consultation for:  Len Blalock. Note will be routed back to referring provider/PCP.  CC: L-sided low back pain  I, Molly Weber, LAT, ATC, am serving as scribe for Dr. Lynne Leader.  HPI: Pt is a 34 y/o female presenting w/ c/o L-sided low back pain and radicular pain into her L buttocks and into L posterior LD to her calf.  Pt has been seen at ED x 4 visits in the past 2  weeks for what began as LLQ and L flank pain and has been worked up for kidney stones.  She has had an XR of the abdomen and had a CT in [place] on 07/03/19.  She most recently saw her PCP and was referred to Sports Medicine.  She rates her pain as an 8/10 located at her L lumbar spine that radiates into the L glute and down the posterior aspect of her L LE to her L calf.  She reports that the pain is so severe that it is causing nausea.  She is also having numbness/tingling and some decreased strengthe in the L LE.  Aggravating factors: sitting, driving, lumbar flexion and L sidelying.  She is currently taking Tylenol, IBU and Tizanidine w/ no relief.  She has been using diclofenac gel and Biofreeze w/ no relief.  She reports urinary urgency but no loss of bowel or bladder function.    She notes current pain is not consistent with previous episodes of kidney stone or gallbladder pain.  Past medical history, Surgical history, Family history, Social history, Allergies, and medications have been entered into the medical record, reviewed.   Review of Systems: No new headache, visual changes, nausea, vomiting, diarrhea, constipation, dizziness, abdominal pain, skin rash, fevers, chills, night sweats, weight loss, swollen lymph nodes, body aches, joint swelling, muscle aches, chest pain, shortness of breath, mood changes, visual or auditory hallucinations.   Objective:    Vitals:   07/05/19 1010  BP: (!) 140/100  Pulse: 90  SpO2: 99%   General: Well Developed, well nourished, and in  no acute distress.  Neuro/Psych: Alert and oriented x3, extra-ocular muscles intact, able to move all 4 extremities, sensation grossly intact. Skin: Warm and dry, no rashes noted.  Respiratory: Not using accessory muscles, speaking in full sentences, trachea midline.  Cardiovascular: Pulses palpable, no extremity edema. Abdomen: Does not appear distended.  Nontender MSK:  L-spine: Nontender to spinal midline.  Tender palpation left lumbar paraspinal musculature and left SI joint. Lumbar motion normal extension.  Slightly limited rotation and lateral flexion bilaterally.  Limited flexion. Lower extremity reflexes and sensation are intact and equal bilateral knees and ankles. Strength intact bilateral lower extremities. Positive left-sided slump test negative right. Positive left-sided straight leg raise test negative right. Pain in low back somewhat reproduced with pressure over ASIS.  Pain felt in left SI joint region.  Left hip: Normal-appearing normal motion. Right hip normal-appearing normal motion.  Mild antalgic gait.  Lab and Radiology Results  X-ray images abdomen obtained January 1 and x-ray images L-spine CT scan obtained January 28, 2019 personally independently reviewed.  No severe degenerative changes.  No acute fractures or lytic lesions.  Impression and Recommendations:    Assessment and Plan: 34 y.o. female with  Left-sided low back pain radiating to left leg significant and concerning for sciatica involving S1 nerve root.  Patient may also have a component of myofascial dysfunction and spasm and around lumbar spine as well.  Fortunately she does not have severe weakness on physical exam today. Plan to proceed with trial of oral prednisone, increase gabapentin, and limited hydrocodone for pain control.  In addition we will proceed with physical therapy.  Recheck back in 1 month.  If worsening or not improving patient will notify me and we will proceed with MRI  sooner. Patient expresses understanding and agreement.  PDMP reviewed during this encounter. Orders Placed This Encounter  Procedures  . Ambulatory referral to Physical Therapy    Referral Priority:   Routine    Referral Type:   Physical Medicine    Referral Reason:   Specialty Services Required    Requested Specialty:   Physical Therapy   Meds ordered this encounter  Medications  . predniSONE (DELTASONE) 50 MG tablet    Sig: Take 1 tablet (50 mg total) by mouth daily.    Dispense:  5 tablet    Refill:  0  . gabapentin (NEURONTIN) 300 MG capsule    Sig: One tab PO qHS for a week, then BID for a week, then TID. May double weekly to a max of 3,600mg /day    Dispense:  180 capsule    Refill:  3  . HYDROcodone-acetaminophen (NORCO/VICODIN) 5-325 MG tablet    Sig: Take 1 tablet by mouth every 6 (six) hours as needed.    Dispense:  15 tablet    Refill:  0    Discussed warning signs or symptoms. Please see discharge instructions. Patient expresses understanding.   The above documentation has been reviewed and is accurate and complete Clementeen Graham

## 2019-07-05 NOTE — Patient Instructions (Signed)
Thank you for coming in today. Take the prednisone.  Ok to reduce dose or stop early if doing well.  Increase gabapentin as needed.  Use norco for severe pain.  Attend PT.  Keep me updated.  Recheck in 1 month or sooner if needed.   Come back or go to the emergency room if you notice new weakness new numbness problems walking or bowel or bladder problems.   Sciatica  Sciatica is pain, numbness, weakness, or tingling along the path of the sciatic nerve. The sciatic nerve starts in the lower back and runs down the back of each leg. The nerve controls the muscles in the lower leg and in the back of the knee. It also provides feeling (sensation) to the back of the thigh, the lower leg, and the sole of the foot. Sciatica is a symptom of another medical condition that pinches or puts pressure on the sciatic nerve. Sciatica most often only affects one side of the body. Sciatica usually goes away on its own or with treatment. In some cases, sciatica may come back (recur). What are the causes? This condition is caused by pressure on the sciatic nerve or pinching of the nerve. This may be the result of:  A disk in between the bones of the spine bulging out too far (herniated disk).  Age-related changes in the spinal disks.  A pain disorder that affects a muscle in the buttock.  Extra bone growth near the sciatic nerve.  A break (fracture) of the pelvis.  Pregnancy.  Tumor. This is rare. What increases the risk? The following factors may make you more likely to develop this condition:  Playing sports that place pressure or stress on the spine.  Having poor strength and flexibility.  A history of back injury or surgery.  Sitting for long periods of time.  Doing activities that involve repetitive bending or lifting.  Obesity. What are the signs or symptoms? Symptoms can vary from mild to very severe, and they may include:  Any of these problems in the lower back, leg, hip, or  buttock: ? Mild tingling, numbness, or dull aches. ? Burning sensations. ? Sharp pains.  Numbness in the back of the calf or the sole of the foot.  Leg weakness.  Severe back pain that makes movement difficult. Symptoms may get worse when you cough, sneeze, or laugh, or when you sit or stand for long periods of time. How is this diagnosed? This condition may be diagnosed based on:  Your symptoms and medical history.  A physical exam.  Blood tests.  Imaging tests, such as: ? X-rays. ? MRI. ? CT scan. How is this treated? In many cases, this condition improves on its own without treatment. However, treatment may include:  Reducing or modifying physical activity.  Exercising and stretching.  Icing and applying heat to the affected area.  Medicines that help to: ? Relieve pain and swelling. ? Relax your muscles.  Injections of medicines that help to relieve pain, irritation, and inflammation around the sciatic nerve (steroids).  Surgery. Follow these instructions at home: Medicines  Take over-the-counter and prescription medicines only as told by your health care provider.  Ask your health care provider if the medicine prescribed to you: ? Requires you to avoid driving or using heavy machinery. ? Can cause constipation. You may need to take these actions to prevent or treat constipation:  Drink enough fluid to keep your urine pale yellow.  Take over-the-counter or prescription medicines.  Eat  foods that are high in fiber, such as beans, whole grains, and fresh fruits and vegetables.  Limit foods that are high in fat and processed sugars, such as fried or sweet foods. Managing pain      If directed, put ice on the affected area. ? Put ice in a plastic bag. ? Place a towel between your skin and the bag. ? Leave the ice on for 20 minutes, 2-3 times a day.  If directed, apply heat to the affected area. Use the heat source that your health care provider  recommends, such as a moist heat pack or a heating pad. ? Place a towel between your skin and the heat source. ? Leave the heat on for 20-30 minutes. ? Remove the heat if your skin turns bright red. This is especially important if you are unable to feel pain, heat, or cold. You may have a greater risk of getting burned. Activity   Return to your normal activities as told by your health care provider. Ask your health care provider what activities are safe for you.  Avoid activities that make your symptoms worse.  Take brief periods of rest throughout the day. ? When you rest for longer periods, mix in some mild activity or stretching between periods of rest. This will help to prevent stiffness and pain. ? Avoid sitting for long periods of time without moving. Get up and move around at least one time each hour.  Exercise and stretch regularly, as told by your health care provider.  Do not lift anything that is heavier than 10 lb (4.5 kg) while you have symptoms of sciatica. When you do not have symptoms, you should still avoid heavy lifting, especially repetitive heavy lifting.  When you lift objects, always use proper lifting technique, which includes: ? Bending your knees. ? Keeping the load close to your body. ? Avoiding twisting. General instructions  Maintain a healthy weight. Excess weight puts extra stress on your back.  Wear supportive, comfortable shoes. Avoid wearing high heels.  Avoid sleeping on a mattress that is too soft or too hard. A mattress that is firm enough to support your back when you sleep may help to reduce your pain.  Keep all follow-up visits as told by your health care provider. This is important. Contact a health care provider if:  You have pain that: ? Wakes you up when you are sleeping. ? Gets worse when you lie down. ? Is worse than you have experienced in the past. ? Lasts longer than 4 weeks.  You have an unexplained weight loss. Get help right  away if:  You are not able to control when you urinate or have bowel movements (incontinence).  You have: ? Weakness in your lower back, pelvis, buttocks, or legs that gets worse. ? Redness or swelling of your back. ? A burning sensation when you urinate. Summary  Sciatica is pain, numbness, weakness, or tingling along the path of the sciatic nerve.  This condition is caused by pressure on the sciatic nerve or pinching of the nerve.  Sciatica can cause pain, numbness, or tingling in the lower back, legs, hips, and buttocks.  Treatment often includes rest, exercise, medicines, and applying ice or heat. This information is not intended to replace advice given to you by your health care provider. Make sure you discuss any questions you have with your health care provider. Document Revised: 06/21/2018 Document Reviewed: 06/21/2018 Elsevier Patient Education  [place].

## 2019-07-05 NOTE — Progress Notes (Signed)
[name] [name] is a 34 y.o. female here for a follow up of a pre-existing problem.  I acted as a Education administrator for [employer], PA-C Anselmo Pickler, LPN  History of Present Illness:   Chief Complaint  Patient presents with  . Left Flank pain  . LLQ pain    HPI   Here to follow-up on ongoing LLQ pain and left flank pain. Has been to the ER 4 times in the past two weeks for this. She has had essentially negative work-up so far with presumed kidney stone. Imaging tests include xray of abdomen which was negative. CT scan on 07/03/19 (in [place] -- I cannot see this record) was negative for stone.  Labs essentially normal except for chronically elevated LFTs. UAs have shown blood. She is s/p roux-en-y, cholecystectomy. Hx of lithotripsy in the past.   Pt says the pain is now left side near base of spine. She is rating her pain 8/10. Taking Tylenol, Ibuprofen, Diclofenac gel, Bio freeze, Tizanidine with no relief.  Pain is now at the base of her spine, off to the L, radiates down left buttocks and radiates down the back of her thigh to her calf. Denies known trauma to back. She denies saddle anesthesia, but has urinary urgency. Has had diarrhea, but denies rectal bleeding/rectal pain.  No LMP recorded. Patient has had an ablation.   Past Medical History:  Diagnosis Date  . Anemia   . Anxiety   . DEPRESSION   . Family history of colon cancer 2019-05-30   Father, 31s; deceased.   Marland Kitchen GERD (gastroesophageal reflux disease)   . Hypothyroidism    Hx of, normalized TSH during pregnancy 2011  . Infertility, female   . Migraines   . Morbid obesity ([place])    s/p RY 08/2012 - start weight 290 pounds  . Nephrolithiasis   . PCOS (polycystic ovarian syndrome)   . PONV (postoperative nausea and vomiting)   . Pregnancy induced hypertension      Social History   Socioeconomic History  . Marital status: Married    Spouse name: Not on file  . Number of children: Not on file  . Years of  education: Not on file  . Highest education level: Not on file  Occupational History  . Occupation: PHARMACY    Employer: [employer]  Tobacco Use  . Smoking status: Never Smoker  . Smokeless tobacco: Never Used  Substance and Sexual Activity  . Alcohol use: Yes    Comment: rare/socially  . Drug use: No  . Sexual activity: Yes    Birth control/protection: Other-see comments    Comment: tubal ligation  Other Topics Concern  . Not on file  Social History Narrative   Married, lives with spouse and dtr born 07/2009. Employed as Occupational psychologist at [employer].   Social Determinants of Health   Financial Resource Strain:   . Difficulty of Paying Living Expenses: Not on file  Food Insecurity:   . Worried About Charity fundraiser in the Last Year: Not on file  . Ran Out of Food in the Last Year: Not on file  Transportation Needs: No Transportation Needs  . Lack of Transportation (Medical): No  . Lack of Transportation (Non-Medical): No  Physical Activity:   . Days of Exercise per Week: Not on file  . Minutes of Exercise per Session: Not on file  Stress:   . Feeling of Stress : Not on file  Social Connections:   . Frequency of Communication  with Friends and Family: Not on file  . Frequency of Social Gatherings with Friends and Family: Not on file  . Attends Religious Services: Not on file  . Active Member of Clubs or Organizations: Not on file  . Attends Banker Meetings: Not on file  . Marital Status: Not on file  Intimate Partner Violence:   . Fear of Current or Ex-Partner: Not on file  . Emotionally Abused: Not on file  . Physically Abused: Not on file  . Sexually Abused: Not on file    Past Surgical History:  Procedure Laterality Date  . Quintella Reichert OSTEOTOMY Left 06/24/2017   Procedure: Ralene Bathe;  Surgeon: [name], DPM;  Location: [facility];  Service: Podiatry;  Laterality: Left;  . BUNIONECTOMY Left 06/24/2017   Procedure: Anthoney Harada;  Surgeon: [name], DPM;  Location: [facility];  Service: Podiatry;  Laterality: Left;  . CESAREAN SECTION     x 2  . CHOLECYSTECTOMY N/A 02/15/2019   Procedure: LAPAROSCOPIC CHOLECYSTECTOMY WITH INTRAOPERATIVE CHOLANGIOGRAM;  Surgeon: Andria Meuse, MD;  Location: [facility];  Service: General;  Laterality: N/A;  . FOOT SURGERY    . 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   Procedure: LAPAROSCOPIC ROUX-EN-Y GASTRIC;  Surgeon: Atilano Ina, MD;  Location: [facility];  Service: General;  Laterality: N/A;  laparoscopic roux-en-y gastric bypass  . LITHOTRIPSY Left   . TONSILLECTOMY    . TUBAL LIGATION    . UPPER GI ENDOSCOPY  08/24/2012   Procedure: UPPER GI ENDOSCOPY;  Surgeon: Atilano Ina, MD;  Location: [facility];  Service: General;;  . WISDOM TOOTH EXTRACTION      Family History  Problem Relation Age of Onset  . Hyperlipidemia Father   . Hypertension Father   . Kidney disease Father   . Colon cancer Father 22  . Hypertension Mother   . Hypertension Maternal Grandmother   . Uterine cancer Maternal Grandmother 62  . Diabetes Maternal Grandfather     Allergies  Allergen Reactions  . Ambien [Zolpidem] Other (See Comments)    Sleep walking   . Nsaids     Gastric bypass    Current Medications:   Current Outpatient Medications:  .  acetaminophen (TYLENOL) 500 MG tablet, Take 2 tablets (1,000 mg total) by mouth every 6 (six) hours as needed for moderate pain., Disp: 30 tablet, Rfl: 0 .  ALPRAZolam (XANAX) 1 MG tablet, Take 1-2 tablets (1-2 mg total) by mouth at bedtime as needed for anxiety., Disp: 60 tablet, Rfl: 2 .  busPIRone (BUSPAR) 7.5 MG tablet, Take 1 tablet (7.5 mg total) by mouth 3 (three) times daily as needed., Disp: 60 tablet, Rfl: 2 .  CALCIUM-MAGNESIUM-ZINC PO, Take 3 tablets by mouth daily., Disp: , Rfl:  .  cyanocobalamin (,VITAMIN B-12,) 1000 MCG/ML injection, Inject 1 vial per week for 3 weeks, then 1 vial per month thereafter, Disp: 6  mL, Rfl: 3 .  diclofenac Sodium (VOLTAREN) 1 % GEL, Apply 4 g topically 4 (four) times daily., Disp: 100 g, Rfl: 0 .  escitalopram (LEXAPRO) 20 MG tablet, Take 1 tablet (20 mg total) by mouth daily., Disp: 90 tablet, Rfl: 2 .  ferrous sulfate 325 (65 FE) MG tablet, Iron (ferrous sulfate) 325 mg (65 mg iron) tablet  Take 1 tablet every day by oral route., Disp: , Rfl:  .  gabapentin (NEURONTIN) 300 MG capsule, Take 1 capsule (300 mg total) by mouth 2 (two) times daily., Disp:  90 capsule, Rfl: 0 .  lamoTRIgine (LAMICTAL) 25 MG tablet, Take 50 mg by mouth daily., Disp: , Rfl:  .  Menaquinone-7 (VITAMIN K2 PO), Take 1 capsule by mouth daily. , Disp: , Rfl:  .  Multiple Vitamins-Iron (ONE-TABLET-DAILY/IRON PO), Take 1 tablet by mouth daily., Disp: , Rfl:  .  ondansetron (ZOFRAN-ODT) 4 MG disintegrating tablet, Take 1 tablet (4 mg total) by mouth every 6 (six) hours as needed for nausea., Disp: 20 tablet, Rfl: 0 .  pantoprazole (PROTONIX) 40 MG tablet, Take 1 tablet (40 mg total) by mouth daily., Disp: 30 tablet, Rfl: 3 .  promethazine (PHENERGAN) 25 MG tablet, Take 1 tablet (25 mg total) by mouth every 8 (eight) hours as needed for nausea or vomiting., Disp: 20 tablet, Rfl: 0 .  SYRINGE-NEEDLE, DISP, 3 ML 25G X 1" 3 ML MISC, Use 1 needle per application once weekly, Disp: 12 each, Rfl: 0 .  tizanidine (ZANAFLEX) 2 MG capsule, Take 2 capsules (4 mg total) by mouth 3 (three) times daily., Disp: 90 capsule, Rfl: 0 .  valACYclovir (VALTREX) 1000 MG tablet, Take 1 tablet (1,000 mg total) by mouth 3 (three) times daily. (Patient taking differently: Take 1,000 mg by mouth 3 (three) times daily. AS NEEDED FOR OUTBREAKS), Disp: 21 tablet, Rfl: 0 .  Vitamin D, Ergocalciferol, (DRISDOL) 1.25 MG (50000 UT) CAPS capsule, Take 50,000 Units by mouth every Monday., Disp: , Rfl:    Review of Systems:   ROS  Negative unless otherwise specified per HPI.  Vitals:   Vitals:   07/05/19 0921  BP: (!) 140/100  Pulse:  90  Temp: 98.4 F (36.9 C)  TempSrc: Temporal  SpO2: 99%  Weight: 262 lb 4 oz (119 kg)  Height: 5\' 4"  (1.626 m)     Body mass index is 45.02 kg/m.  Physical Exam:   Physical Exam Vitals and nursing note reviewed.  Constitutional:      General: She is not in acute distress.    Appearance: She is well-developed. She is not ill-appearing or toxic-appearing.  Cardiovascular:     Rate and Rhythm: Normal rate and regular rhythm.     Pulses: Normal pulses.     Heart sounds: Normal heart sounds, S1 normal and S2 normal.     Comments: No LE edema Pulmonary:     Effort: Pulmonary effort is normal.     Breath sounds: Normal breath sounds.  Skin:    General: Skin is warm and dry.  Neurological:     Mental Status: She is alert.     GCS: GCS eye subscore is 4. GCS verbal subscore is 5. GCS motor subscore is 6.  Psychiatric:        Speech: Speech normal.        Behavior: Behavior normal. Behavior is cooperative.     Assessment and Plan:   Reather was seen today for left flank pain and llq pain.  Diagnoses and all orders for this visit:  Acute left-sided low back pain with left-sided sciatica Ongoing. Urgent referral to sports medicine due to acuity of pain. Further work-up per their discretion. Will need follow-up on hematuria, will defer to PCP/urology. -     Ambulatory referral to Sports Medicine  . Reviewed expectations re: course of current medical issues. . Discussed self-management of symptoms. . Outlined signs and symptoms indicating need for more acute intervention. . Patient verbalized understanding and all questions were answered. . See orders for this visit as documented in the electronic medical  record. . Patient received an After-Visit Summary.  CMA or LPN served as scribe during this visit. History, Physical, and Plan performed by medical provider. The above documentation has been reviewed and is accurate and complete.  Jarold Motto, PA-C

## 2019-07-05 NOTE — Patient Instructions (Addendum)
It was great to see you!  A referral has been placed for you to see Dr. Clementeen Graham with [facility]. Someone will be calling you soon for an appointment.  Phone number [phone]  His location: Adult nurse Sports Medicine at [facility] [address] on the 1st floor.    This location is across the street from the entrance to [employer] and in the same complex as the [facility] and [employer] bank  Take care,  Jarold Motto PA-C

## 2019-07-07 ENCOUNTER — Encounter: Payer: Self-pay | Admitting: Family Medicine

## 2019-07-07 ENCOUNTER — Other Ambulatory Visit: Payer: Self-pay | Admitting: Family Medicine

## 2019-07-08 MED ORDER — HYDROCODONE-ACETAMINOPHEN 5-325 MG PO TABS: 1.0000 | ORAL_TABLET | Freq: Four times a day (QID) | ORAL | 0 refills | Status: DC | PRN

## 2019-07-08 MED FILL — HYDROCODON-APAP 5-325: 5-325 | 3 days supply | Qty: 15 | Fill #0

## 2019-07-08 MED FILL — VITAMIN D3 50,000 UNITS CAP: 1.25 MG | 84 days supply | Qty: 12 | Fill #0

## 2019-07-10 ENCOUNTER — Other Ambulatory Visit: Payer: Self-pay | Admitting: Family Medicine

## 2019-07-10 ENCOUNTER — Encounter: Payer: Self-pay | Admitting: Family Medicine

## 2019-07-11 ENCOUNTER — Other Ambulatory Visit: Payer: Self-pay

## 2019-07-11 MED ORDER — HYDROCODONE-ACETAMINOPHEN 5-325 MG PO TABS: 1.0000 | ORAL_TABLET | Freq: Four times a day (QID) | ORAL | 0 refills | Status: DC | PRN

## 2019-07-11 MED FILL — HYDROCODON-APAP 5-325: 5-325 | 4 days supply | Qty: 15 | Fill #0

## 2019-07-11 MED FILL — ALPRAZolam 1 MG TABS: 1 | 30 days supply | Qty: 60 | Fill #1

## 2019-07-11 MED FILL — VALACYCLOVIR HCL 500 MG TAB: 500 | 1 days supply | Qty: 4 | Fill #2

## 2019-07-12 ENCOUNTER — Encounter: Payer: Self-pay | Admitting: Family Medicine

## 2019-07-12 ENCOUNTER — Ambulatory Visit (INDEPENDENT_AMBULATORY_CARE_PROVIDER_SITE_OTHER): Payer: No Typology Code available for payment source | Admitting: Family Medicine

## 2019-07-12 ENCOUNTER — Ambulatory Visit: Payer: No Typology Code available for payment source | Admitting: Family Medicine

## 2019-07-12 VITALS — BP 112/78 | HR 119 | Ht 64.0 in | Wt 270.0 lb

## 2019-07-12 VITALS — BP 124/88 | HR 110 | Temp 98.2°F | Ht 64.0 in | Wt 270.4 lb

## 2019-07-12 DIAGNOSIS — G4762 Sleep related leg cramps: Secondary | ICD-10-CM | POA: Diagnosis not present

## 2019-07-12 DIAGNOSIS — M5416 Radiculopathy, lumbar region: Secondary | ICD-10-CM | POA: Diagnosis not present

## 2019-07-12 DIAGNOSIS — M5442 Lumbago with sciatica, left side: Secondary | ICD-10-CM

## 2019-07-12 DIAGNOSIS — E538 Deficiency of other specified B group vitamins: Secondary | ICD-10-CM | POA: Diagnosis not present

## 2019-07-12 DIAGNOSIS — E611 Iron deficiency: Secondary | ICD-10-CM

## 2019-07-12 DIAGNOSIS — R29898 Other symptoms and signs involving the musculoskeletal system: Secondary | ICD-10-CM | POA: Diagnosis not present

## 2019-07-12 DIAGNOSIS — F339 Major depressive disorder, recurrent, unspecified: Secondary | ICD-10-CM

## 2019-07-12 DIAGNOSIS — F410 Panic disorder [episodic paroxysmal anxiety] without agoraphobia: Secondary | ICD-10-CM

## 2019-07-12 DIAGNOSIS — E559 Vitamin D deficiency, unspecified: Secondary | ICD-10-CM

## 2019-07-12 LAB — CBC WITH DIFFERENTIAL/PLATELET
Basophils Absolute: 0 10*3/uL (ref 0.0–0.1)
Basophils Relative: 0.2 % (ref 0.0–3.0)
Eosinophils Absolute: 0.1 10*3/uL (ref 0.0–0.7)
Eosinophils Relative: 1 % (ref 0.0–5.0)
HCT: 38.4 % (ref 36.0–46.0)
Hemoglobin: 12.3 g/dL (ref 12.0–15.0)
Lymphocytes Relative: 23.3 % (ref 12.0–46.0)
Lymphs Abs: 3.3 10*3/uL (ref 0.7–4.0)
MCHC: 31.9 g/dL (ref 30.0–36.0)
MCV: 84.5 fl (ref 78.0–100.0)
Monocytes Absolute: 0.8 10*3/uL (ref 0.1–1.0)
Monocytes Relative: 5.5 % (ref 3.0–12.0)
Neutro Abs: 10 10*3/uL — ABNORMAL HIGH (ref 1.4–7.7)
Neutrophils Relative %: 70 % (ref 43.0–77.0)
Platelets: 347 10*3/uL (ref 150.0–400.0)
RBC: 4.54 Mil/uL (ref 3.87–5.11)
RDW: 18.1 % — ABNORMAL HIGH (ref 11.5–15.5)
WBC: 14.4 10*3/uL — ABNORMAL HIGH (ref 4.0–10.5)

## 2019-07-12 LAB — B12 AND FOLATE PANEL
Folate: 19.1 ng/mL (ref >5.9)
Vitamin B-12: 345 pg/mL (ref 211–911)

## 2019-07-12 LAB — VITAMIN D 25 HYDROXY (VIT D DEFICIENCY, FRACTURES): VITD: 24.37 ng/mL — ABNORMAL LOW (ref 30.00–100.00)

## 2019-07-12 MED ORDER — LORAZEPAM 0.5 MG PO TABS: ORAL_TABLET | ORAL | 0 refills | Status: DC

## 2019-07-12 MED ORDER — TIZANIDINE HCL 2 MG PO CAPS: 4.0000 mg | ORAL_CAPSULE | Freq: Three times a day (TID) | ORAL | 0 refills | Status: DC

## 2019-07-12 MED ORDER — OXYCODONE-ACETAMINOPHEN 5-325 MG PO TABS: 1.0000 | ORAL_TABLET | ORAL | 0 refills | Status: DC | PRN

## 2019-07-12 MED FILL — LORazepam 0.5 MG TABS: 0.5 | 2 days supply | Qty: 4 | Fill #0

## 2019-07-12 MED FILL — OXYCODONE-APAP 5-325MG: 5-325 | 2 days supply | Qty: 15 | Fill #0

## 2019-07-12 NOTE — Progress Notes (Signed)
I, Christoper Fabian, LAT, ATC, am serving as scribe for Dr. Clementeen Graham.  [name] [name] is a 34 y.o. female who presents to [facility] at [facility] today for f/u of low back pain and radicular pain to the L calf.  She has both numbness/tingling and decreased strength in her L LE.  She was last seen by Dr. Denyse Amass on 07/05/19 and was prescribed hydrocodone-acetaminophen, prednisone and gabapentin as well as being referred to outpatient PT.  Since her last visit, pt reports that her L leg gave out on Saturday causing her to fall into a split position, hitting her R knee on the ground.  She notes that her L leg feels more weak and has more numbness/tingling than at her last visit.  Her numbness/tingling in her L leg is now travelling from her L glute and distally along the L lateral leg to the L lateral knee/calf area.   Had CT scan ABD and Pelvis in [place] on mid January that imaged the Lspine.  No report of fracture or congenital malalignment.  This is equivalent to or superior to L-spine x-rays.   Pertinent review of systems: No fevers or chills.  No bowel or bladder dysfunction.  Relevant historical information: Status post Roux-en-Y gastric bypass.  History of B12 deficiency.   Exam:  BP 112/78 (BP Location: Left Arm, Patient Position: Sitting, Cuff Size: Large)   Pulse (!) 119   Ht 5\' 4"  (1.626 m)   Wt 270 lb (122.5 kg)   SpO2 97%   BMI 46.35 kg/m  General: Well Developed, well nourished, and in no acute distress.   MSK:  L-spine: Nontender to midline.  Normal motion. Lower extremity strength intact with the exception of left knee extension 4+/5.  Knee flexion strength 4+/5. Foot dorsiflexion plantar flexion 5/5. Right lower extremity strength intact throughout. Reflexes equal normal bilateral extremities.   Right knee: Mild bruising anterior knee overlying patella.  Mildly tender palpation.  Normal knee motion. Stable ligamentous exam.  Intact  strength.    Lab and Radiology Results As above CT scan abdomen pelvis [place] 2 weeks ago.    Assessment and Plan: 34 y.o. female with  Worsening lumbar radiculopathy primarily left leg and S1.  Now with worsening weakness and falls.  Based on progressive neurological symptoms we will proceed with relatively urgent MRI.  There is availability in that center [facility] for MRI this weekend Saturday Sunday and Monday.  Will order MRI to anticipate be done over the weekend.  Recheck following MRI. Switch to oxycodone for pain control his hydrocodone less effective.  Refill tizanidine.  Additionally prescribed Ativan for use of her anxiety with MRI.  Cautioned patient against doubling of the multiple sedating medications such as tizanidine oxycodone Ativan. Precautions reviewed.  Right knee contusion: Seems to be the extent of the injury.  No evidence of severe ligamentous injury or fracture.  Will proceed with watchful waiting and if not improving will arrange for x-ray.   PDMP reviewed during this encounter. Orders Placed This Encounter  Procedures  . MR Lumbar Spine Wo Contrast    Schedule Saturday or Sunday    Standing Status:   Future    Standing Expiration Date:   09/08/2020    Order Specific Question:   What is the patient's sedation requirement?    Answer:   Anti-anxiety    Order Specific Question:   Does the patient have a pacemaker or implanted devices?    Answer:   No  Order Specific Question:   Preferred imaging location?    Answer:   Product/process development scientist (table limit-350lbs)    Order Specific Question:   Radiology Contrast Protocol - do NOT remove file path    Answer:   [email or web address]   Meds ordered this encounter  Medications  . LORazepam (ATIVAN) 0.5 MG tablet    Sig: 1-2 tabs 30 - 60 min prior to MRI. Do not drive with this medicine.    Dispense:  4 tablet    Refill:  0  . oxyCODONE-acetaminophen (PERCOCET/ROXICET) 5-325 MG  tablet    Sig: Take 1 tablet by mouth every 4 (four) hours as needed for severe pain.    Dispense:  15 tablet    Refill:  0  . tizanidine (ZANAFLEX) 2 MG capsule    Sig: Take 2 capsules (4 mg total) by mouth 3 (three) times daily.    Dispense:  90 capsule    Refill:  0     Discussed warning signs or symptoms. Please see discharge instructions. Patient expresses understanding.   The above documentation has been reviewed and is accurate and complete Lynne Leader

## 2019-07-12 NOTE — Progress Notes (Signed)
Subjective  CC:  Chief Complaint  Patient presents with  . Depression    improved    HPI: [name] [name] is a 34 y.o. female who presents to the office today to address the problems listed above in the chief complaint, mood problems.  Doing much better. Last visit we added buspar and she is using 2-3x/day with notable benefits. Mood is also improved since husband has got a job now Museum/gallery curator). Work is also better with new work routine. Remains on lamictal and lexapro for mood and feels they are very helpful.   Obesity: continues to gain weight; would like help. Had been on vyvanse for binge eating d/o but had side effects. Eats healthy s/o gastric bypass.  Left leg radiculopathy dxd and awaiting MRI. Reviewed todays ov with SM and findings. Now on oxycodone. Bilateral lower leg pain is improved on pain meds but it had persisted w/o clear etiology.   Vit d and b12 deficiencies on supplements: due for recheck. Malabsorption  Iron deficiency on oral supplements: due for recheck. Cbc had improved by last check   Depression screen [facility] 2/9 07/12/2019 03/11/2019 08/17/2018  Decreased Interest 2 1 1   Down, Depressed, Hopeless 1 1 0  PHQ - 2 Score 3 2 1   Altered sleeping 3 3 3   Tired, decreased energy 1 1 1   Change in appetite 1 2 3   Feeling bad or failure about yourself  0 1 0  Trouble concentrating 3 0 0  Moving slowly or fidgety/restless 0 0 0  Suicidal thoughts 0 0 0  PHQ-9 Score 11 9 8   Difficult doing work/chores Extremely dIfficult Somewhat difficult -  Some recent data might be hidden     Assessment  1. Major depression, recurrent, chronic ([place])   2. Panic disorder   3. Acute left-sided low back pain with left-sided sciatica   4. Morbid obesity ([place])   5. Vitamin B12 deficiency   6. Vitamin D deficiency   7. Dietary iron deficiency      Plan   Depression:  Improved continue same meds  Panic: stable with buspar  Radiuclopathy: awaiting MRI and further eval. On  oxy : cautioned against side effects in addition to benzo use  Morbid obesity: consider GLP-1 to aide weight loss; will wait to improve her leg pain eval first though. Counseled on diet and stress as this affects weight loss  Recheck vitamin levels.   Follow up: 8 weeks to recheck weight  Orders Placed This Encounter  Procedures  . B12 and Folate Panel  . CBC with Differential/Platelet  . Iron, TIBC and Ferritin Panel  . VITAMIN D 25 Hydroxy (Vit-D Deficiency, Fractures)   No orders of the defined types were placed in this encounter.     I reviewed the patients updated PMH, FH, and SocHx.    Patient Active Problem List   Diagnosis Date Noted  . Panic disorder 06/01/2019    Priority: High  . Family history of colon cancer 05/16/2019    Priority: High  . Major depression, recurrent, chronic ([place]) 05/16/2019    Priority: High  . Insomnia due to other mental disorder 03/13/2019    Priority: High  . Morbid obesity ([place]) 04/23/2017    Priority: High  . Acquired iron deficiency anemia due to decreased absorption 02/22/2015    Priority: High  . 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    Priority: High  . Vitamin B12 deficiency 06/01/2019    Priority: Medium  . Chronic  constipation 05/16/2019    Priority: Medium  . Pain in both lower extremities 11/21/2018    Priority: Medium  . Nocturnal leg cramps 10/26/2018    Priority: Medium  . Nephrolithiasis     Priority: Medium  . Binge eating disorder 04/23/2017    Priority: Medium  . Vitamin D deficiency 06/01/2019    Priority: Low  . Reduced libido, due to Lexapro 07/09/2017    Priority: Low   Current Meds  Medication Sig  . acetaminophen (TYLENOL) 500 MG tablet Take 2 tablets (1,000 mg total) by mouth every 6 (six) hours as needed for moderate pain.  Marland Kitchen ALPRAZolam (XANAX) 1 MG tablet Take 1-2 tablets (1-2 mg total) by mouth at bedtime as needed for anxiety.  . busPIRone (BUSPAR) 7.5 MG tablet Take 1 tablet (7.5 mg  total) by mouth 3 (three) times daily as needed.  Marland Kitchen CALCIUM-MAGNESIUM-ZINC PO Take 3 tablets by mouth daily.  . cyanocobalamin (,VITAMIN B-12,) 1000 MCG/ML injection Inject 1 vial per week for 3 weeks, then 1 vial per month thereafter  . diclofenac Sodium (VOLTAREN) 1 % GEL Apply 4 g topically 4 (four) times daily.  Marland Kitchen escitalopram (LEXAPRO) 20 MG tablet Take 1 tablet (20 mg total) by mouth daily.  . ferrous sulfate 325 (65 FE) MG tablet Iron (ferrous sulfate) 325 mg (65 mg iron) tablet  Take 1 tablet every day by oral route.  . gabapentin (NEURONTIN) 300 MG capsule One tab PO qHS for a week, then BID for a week, then TID. May double weekly to a max of 3,600mg /day  . HYDROcodone-acetaminophen (NORCO/VICODIN) 5-325 MG tablet Take 1 tablet by mouth every 6 (six) hours as needed.  . lamoTRIgine (LAMICTAL) 25 MG tablet Take 50 mg by mouth daily.  Marland Kitchen LORazepam (ATIVAN) 0.5 MG tablet 1-2 tabs 30 - 60 min prior to MRI. Do not drive with this medicine.  . Menaquinone-7 (VITAMIN K2 PO) Take 1 capsule by mouth daily.   . Multiple Vitamins-Iron (ONE-TABLET-DAILY/IRON PO) Take 1 tablet by mouth daily.  . ondansetron (ZOFRAN-ODT) 4 MG disintegrating tablet Take 1 tablet (4 mg total) by mouth every 6 (six) hours as needed for nausea.  Marland Kitchen oxyCODONE-acetaminophen (PERCOCET/ROXICET) 5-325 MG tablet Take 1 tablet by mouth every 4 (four) hours as needed for severe pain.  . pantoprazole (PROTONIX) 40 MG tablet Take 1 tablet (40 mg total) by mouth daily.  . promethazine (PHENERGAN) 25 MG tablet Take 1 tablet (25 mg total) by mouth every 8 (eight) hours as needed for nausea or vomiting.  Marland Kitchen SYRINGE-NEEDLE, DISP, 3 ML 25G X 1" 3 ML MISC Use 1 needle per application once weekly  . tizanidine (ZANAFLEX) 2 MG capsule Take 2 capsules (4 mg total) by mouth 3 (three) times daily.  . valACYclovir (VALTREX) 1000 MG tablet Take 1 tablet (1,000 mg total) by mouth 3 (three) times daily. (Patient taking differently: Take 1,000 mg by  mouth 3 (three) times daily. AS NEEDED FOR OUTBREAKS)  . Vitamin D, Ergocalciferol, (DRISDOL) 1.25 MG (50000 UT) CAPS capsule Take 50,000 Units by mouth every Monday.    Allergies: Patient is allergic to ambien [zolpidem] and nsaids. Family history:  Patient family history includes Colon cancer (age of onset: 36) in her father; Diabetes in her maternal grandfather; Hyperlipidemia in her father; Hypertension in her father, maternal grandmother, and mother; Kidney disease in her father; Uterine cancer (age of onset: 37) in her maternal grandmother. Social History   Socioeconomic History  . Marital status: Married  Spouse name: Not on file  . Number of children: Not on file  . Years of education: Not on file  . Highest education level: Not on file  Occupational History  . Occupation: PHARMACY    Employer: [employer]  Tobacco Use  . Smoking status: Never Smoker  . Smokeless tobacco: Never Used  Substance and Sexual Activity  . Alcohol use: Yes    Comment: rare/socially  . Drug use: No  . Sexual activity: Yes    Birth control/protection: Other-see comments    Comment: tubal ligation  Other Topics Concern  . Not on file  Social History Narrative   Married, lives with spouse and dtr born 07/2009. Employed as Associate Professor at [employer].   Social Determinants of Health   Financial Resource Strain:   . Difficulty of Paying Living Expenses: Not on file  Food Insecurity:   . Worried About Programme researcher, broadcasting/film/video in the Last Year: Not on file  . Ran Out of Food in the Last Year: Not on file  Transportation Needs: No Transportation Needs  . Lack of Transportation (Medical): No  . Lack of Transportation (Non-Medical): No  Physical Activity:   . Days of Exercise per Week: Not on file  . Minutes of Exercise per Session: Not on file  Stress:   . Feeling of Stress : Not on file  Social Connections:   . Frequency of Communication with Friends and Family: Not on file  . Frequency of Social  Gatherings with Friends and Family: Not on file  . Attends Religious Services: Not on file  . Active Member of Clubs or Organizations: Not on file  . Attends Banker Meetings: Not on file  . Marital Status: Not on file     Review of Systems: Constitutional: Negative for fever malaise or anorexia Cardiovascular: negative for chest pain Respiratory: negative for SOB or persistent cough Gastrointestinal: negative for abdominal pain  Objective  Vitals: BP 124/88 (BP Location: Left Arm, Patient Position: Sitting, Cuff Size: Large)   Pulse (!) 110   Temp 98.2 F (36.8 C) (Temporal)   Ht 5\' 4"  (1.626 m)   Wt 270 lb 6.4 oz (122.7 kg)   SpO2 94%   BMI 46.41 kg/m  General: no acute distress, well appearing but appears tired, no apparent distress, well groomed Psych:  Alert and oriented x 3,normal mood, behavior, speech, dress, and thought processes.  Skin:  Warm, no rashes    Commons side effects, risks, benefits, and alternatives for medications and treatment plan prescribed today were discussed, and the patient expressed understanding of the given instructions. Patient is instructed to call or message via MyChart if he/she has any questions or concerns regarding our treatment plan. No barriers to understanding were identified. We discussed Red Flag symptoms and signs in detail. Patient expressed understanding regarding what to do in case of urgent or emergency type symptoms.   Medication list was reconciled, printed and provided to the patient in AVS. Patient instructions and summary information was reviewed with the patient as documented in the AVS. This note was prepared with assistance of Dragon voice recognition software. Occasional wrong-word or sound-a-like substitutions may have occurred due to the inherent limitations of voice recognition software

## 2019-07-12 NOTE — Patient Instructions (Signed)
Thank you for coming in today. You should hear soon about MRI scheduled. Plan for [facility].  Let me know if you do not hear anything.  Recheck after MRI.  Plan for Monday or Tuesday.  We will go from there.  . Be careful with narcotics and medicine like ativan as both are sedating.    Come back or go to the emergency room if you notice new weakness new numbness problems walking or bowel or bladder problems.

## 2019-07-12 NOTE — Patient Instructions (Signed)
Please return in 8 weeks for weight loss.  I will release your lab results to you on your MyChart account with further instructions. Please reply with any questions.    If you have any questions or concerns, please don't hesitate to send me a message via MyChart or call the office at [phone]. Thank you for visiting with [place] today! It's our pleasure caring for you.

## 2019-07-13 LAB — IRON,TIBC AND FERRITIN PANEL
%SAT: 7 % (calc) — ABNORMAL LOW (ref 16–45)
Ferritin: 14 ng/mL — ABNORMAL LOW (ref 16–154)
Iron: 26 ug/dL — ABNORMAL LOW (ref 40–190)
TIBC: 360 mcg/dL (calc) (ref 250–450)

## 2019-07-13 MED FILL — BUSPIRONE HCL 7.5 MG TABS: 7.5 | 20 days supply | Qty: 60 | Fill #2

## 2019-07-14 ENCOUNTER — Emergency Department (HOSPITAL_COMMUNITY): Payer: No Typology Code available for payment source

## 2019-07-14 ENCOUNTER — Encounter: Payer: Self-pay | Admitting: Family Medicine

## 2019-07-14 ENCOUNTER — Other Ambulatory Visit: Payer: Self-pay

## 2019-07-14 ENCOUNTER — Other Ambulatory Visit: Payer: Self-pay | Admitting: Family Medicine

## 2019-07-14 ENCOUNTER — Ambulatory Visit: Payer: No Typology Code available for payment source | Admitting: Family Medicine

## 2019-07-14 ENCOUNTER — Telehealth: Payer: Self-pay | Admitting: Family Medicine

## 2019-07-14 ENCOUNTER — Ambulatory Visit (INDEPENDENT_AMBULATORY_CARE_PROVIDER_SITE_OTHER): Payer: No Typology Code available for payment source | Admitting: Family Medicine

## 2019-07-14 ENCOUNTER — Encounter (HOSPITAL_COMMUNITY): Payer: Self-pay

## 2019-07-14 ENCOUNTER — Emergency Department (HOSPITAL_COMMUNITY)
Admission: EM | Admit: 2019-07-14 | Discharge: 2019-07-14 | Disposition: A | Discharge status: Home / Self Care | Payer: No Typology Code available for payment source | Attending: Emergency Medicine | Admitting: Emergency Medicine

## 2019-07-14 VITALS — BP 150/100 | Temp 100.5°F | Ht 64.0 in | Wt 260.0 lb

## 2019-07-14 DIAGNOSIS — H538 Other visual disturbances: Secondary | ICD-10-CM

## 2019-07-14 DIAGNOSIS — R059 Cough, unspecified: Secondary | ICD-10-CM

## 2019-07-14 DIAGNOSIS — R509 Fever, unspecified: Secondary | ICD-10-CM

## 2019-07-14 DIAGNOSIS — K1379 Other lesions of oral mucosa: Secondary | ICD-10-CM | POA: Diagnosis not present

## 2019-07-14 DIAGNOSIS — Z20822 Contact with and (suspected) exposure to covid-19: Secondary | ICD-10-CM | POA: Insufficient documentation

## 2019-07-14 DIAGNOSIS — G4762 Sleep related leg cramps: Secondary | ICD-10-CM

## 2019-07-14 DIAGNOSIS — E039 Hypothyroidism, unspecified: Secondary | ICD-10-CM | POA: Insufficient documentation

## 2019-07-14 DIAGNOSIS — Z79899 Other long term (current) drug therapy: Secondary | ICD-10-CM | POA: Diagnosis not present

## 2019-07-14 DIAGNOSIS — R519 Headache, unspecified: Secondary | ICD-10-CM | POA: Diagnosis not present

## 2019-07-14 DIAGNOSIS — M791 Myalgia, unspecified site: Secondary | ICD-10-CM | POA: Diagnosis present

## 2019-07-14 DIAGNOSIS — R05 Cough: Secondary | ICD-10-CM | POA: Diagnosis not present

## 2019-07-14 LAB — CBC WITH DIFFERENTIAL/PLATELET
Abs Immature Granulocytes: 0.06 10*3/uL (ref 0.00–0.07)
Basophils Absolute: 0 10*3/uL (ref 0.0–0.1)
Basophils Relative: 0 %
Eosinophils Absolute: 0.3 10*3/uL (ref 0.0–0.5)
Eosinophils Relative: 2 %
HCT: 36.6 % (ref 36.0–46.0)
Hemoglobin: 11.4 g/dL — ABNORMAL LOW (ref 12.0–15.0)
Immature Granulocytes: 1 %
Lymphocytes Relative: 17 %
Lymphs Abs: 2.1 10*3/uL (ref 0.7–4.0)
MCH: 27.7 pg (ref 26.0–34.0)
MCHC: 31.1 g/dL (ref 30.0–36.0)
MCV: 88.8 fL (ref 80.0–100.0)
Monocytes Absolute: 0.8 10*3/uL (ref 0.1–1.0)
Monocytes Relative: 7 %
Neutro Abs: 9 10*3/uL — ABNORMAL HIGH (ref 1.7–7.7)
Neutrophils Relative %: 73 %
Platelets: 315 10*3/uL (ref 150–400)
RBC: 4.12 MIL/uL (ref 3.87–5.11)
RDW: 16.7 % — ABNORMAL HIGH (ref 11.5–15.5)
WBC: 12.3 10*3/uL — ABNORMAL HIGH (ref 4.0–10.5)
nRBC: 0 % (ref 0.0–0.2)

## 2019-07-14 LAB — BASIC METABOLIC PANEL
Anion gap: 7 (ref 5–15)
BUN: 8 mg/dL (ref 6–20)
CO2: 29 mmol/L (ref 22–32)
Calcium: 8.6 mg/dL — ABNORMAL LOW (ref 8.9–10.3)
Chloride: 105 mmol/L (ref 98–111)
Creatinine, Ser: 0.46 mg/dL (ref 0.44–1.00)
GFR calc Af Amer: >60 mL/min (ref >60)
GFR calc non Af Amer: >60 mL/min (ref >60)
Glucose, Bld: 92 mg/dL (ref 70–99)
Potassium: 3.5 mmol/L (ref 3.5–5.1)
Sodium: 141 mmol/L (ref 135–145)

## 2019-07-14 LAB — POC SARS CORONAVIRUS 2 AG -  ED: SARS Coronavirus 2 Ag: NEGATIVE

## 2019-07-14 IMAGING — DX DG CHEST 1V PORT
1 series · 1 of 1 positions shown · non-contrast
Comparison: [DATE]

CLINICAL DATA: Cough with body aches

EXAM:
PORTABLE CHEST 1 VIEW

[chest ap]
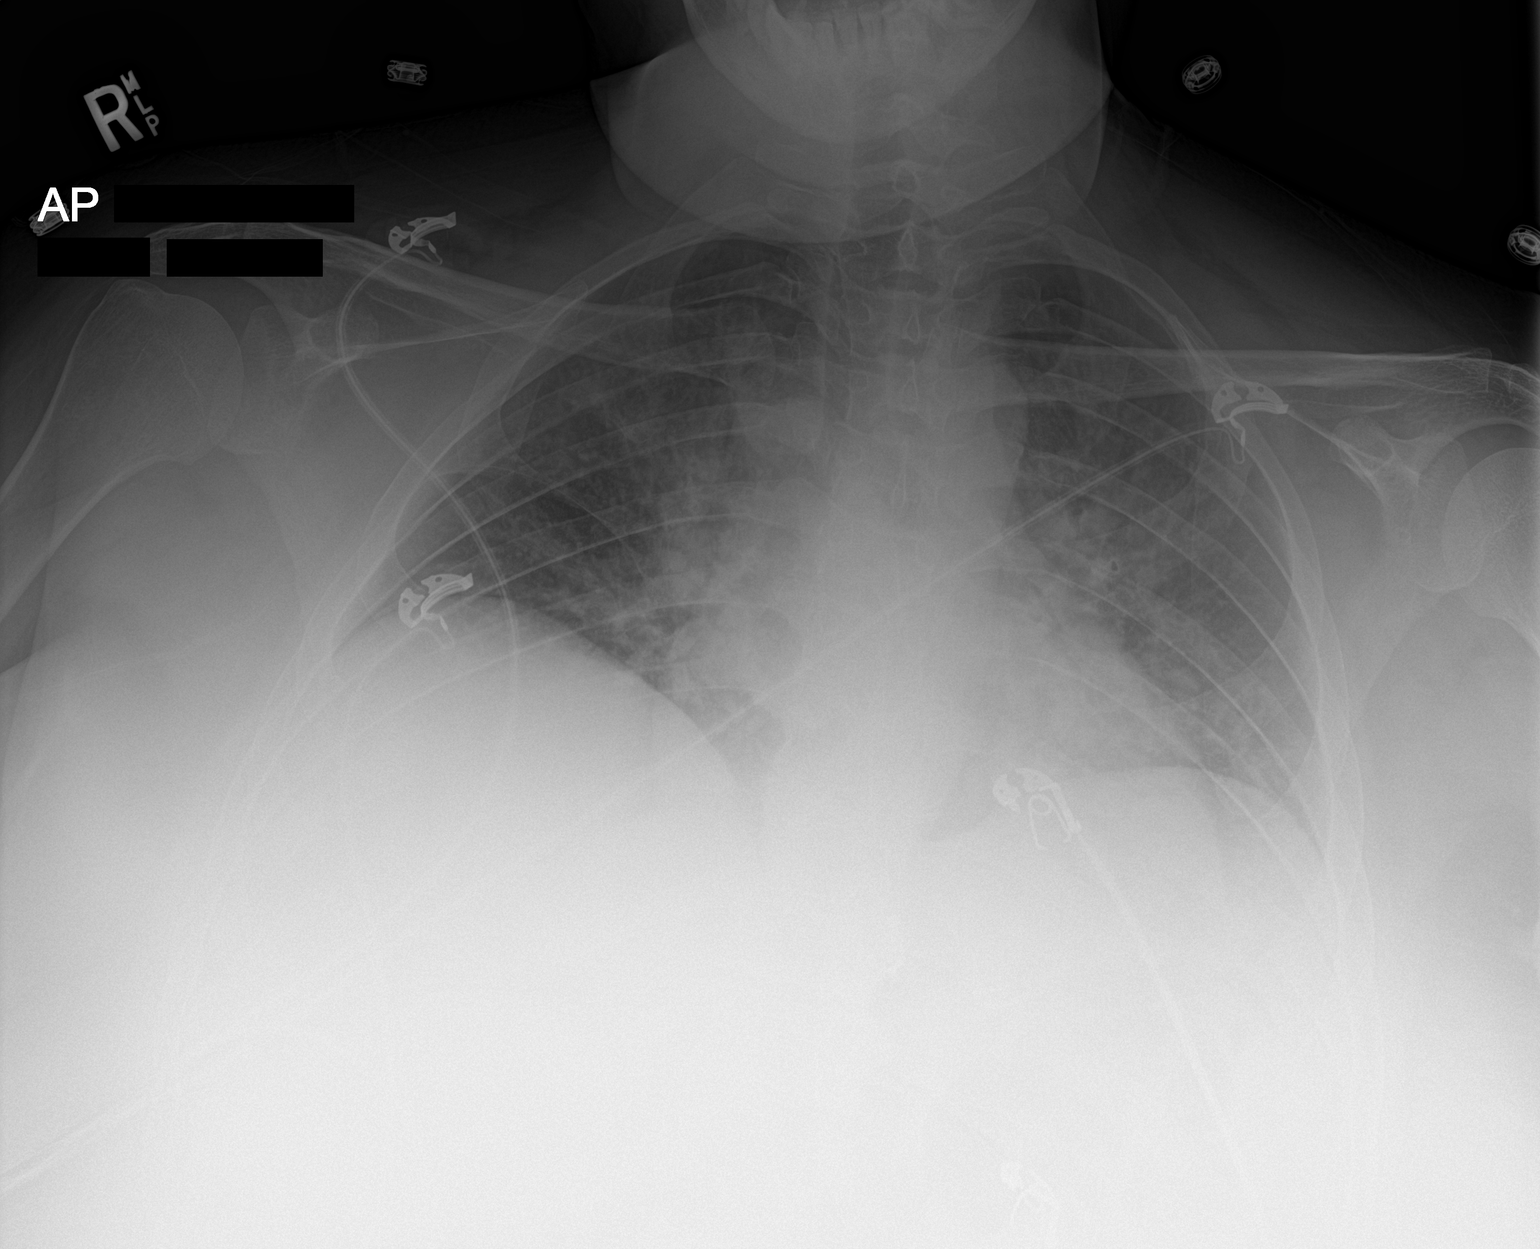

[1 of 1 positions shown; findings below may reference images not displayed]

FINDINGS: Low lung volumes. Mild cardiomegaly with central vascular
congestion. Hazy perihilar opacity. No pleural effusion or
pneumothorax.
IMPRESSION: 1. Low lung volumes with cardiomegaly and central vascular
congestion.
2. Hazy perihilar opacity could reflect mild perihilar edema or
potential atypical/viral infection.

## 2019-07-14 IMAGING — CT CT HEAD W/O CM
3 series · 16 of 47 positions shown, 19 images · non-contrast
Comparison: [DATE]

CLINICAL DATA: Blurry vision with headache.

EXAM:
CT HEAD WITHOUT CONTRAST
TECHNIQUE: Contiguous axial images were obtained from the base of the skull
through the vertex without intravenous contrast.

[Series 2: head wo · axial · 0.46mm/px · z∈[+1447,+1572]mm · 10 of 31 slices shown, 13 images]
[im 3/31  brain]
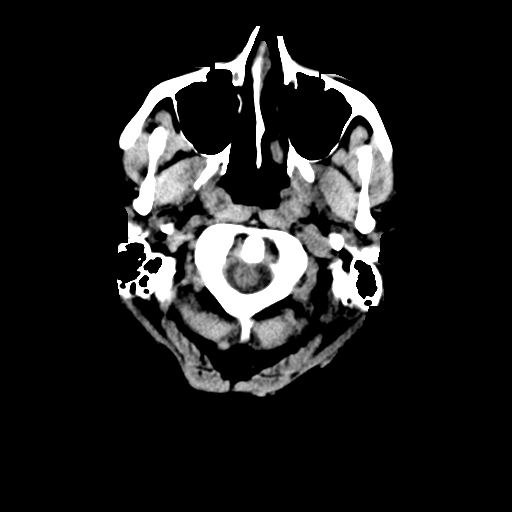
[im 3/31  bone]
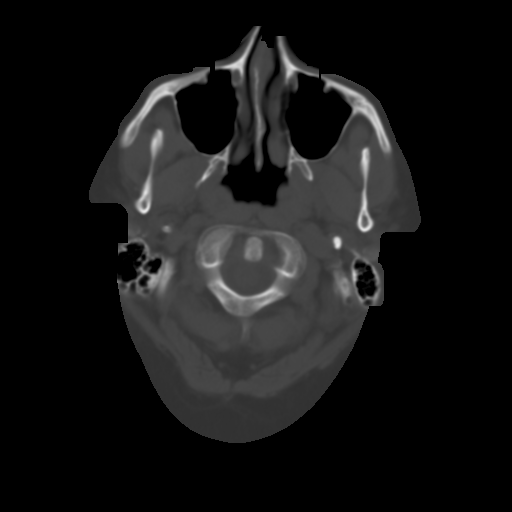
[im 6/31  brain]
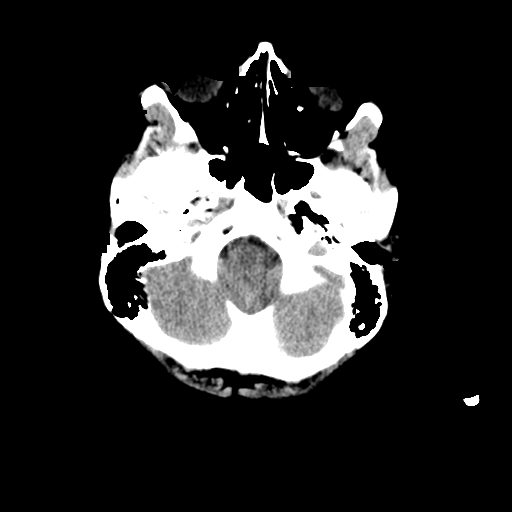
[im 9/31  brain]
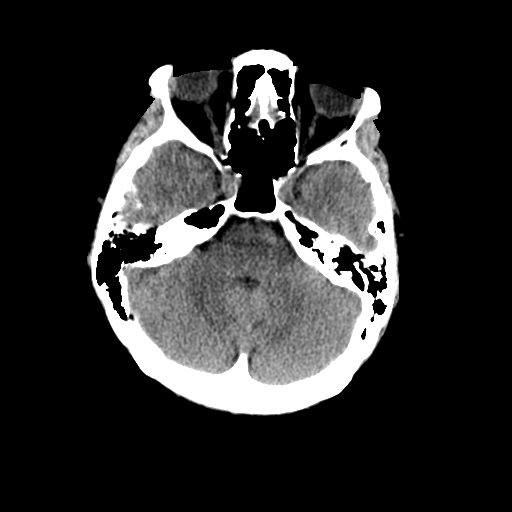
[im 11/31  brain]
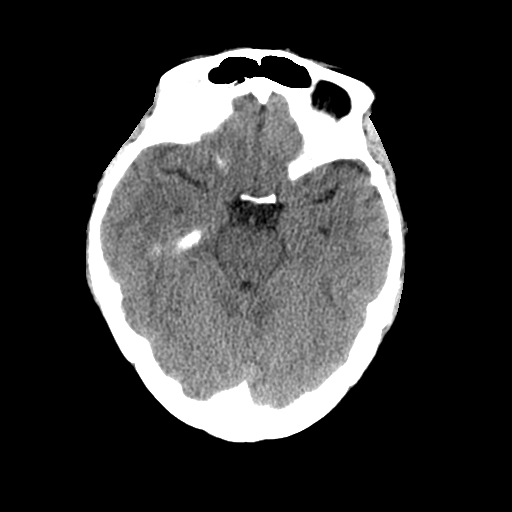
[im 14/31  brain]
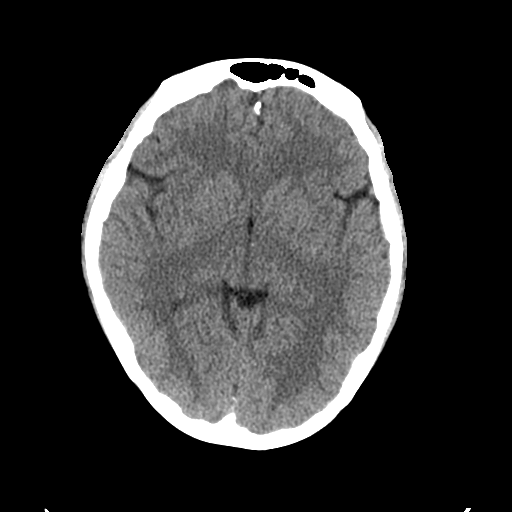
[im 14/31  bone]
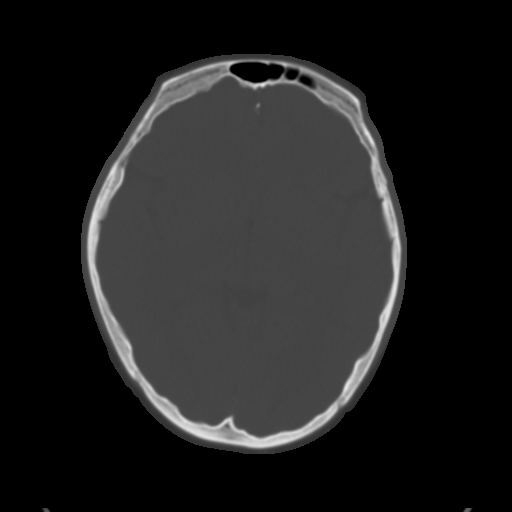
[im 17/31  brain]
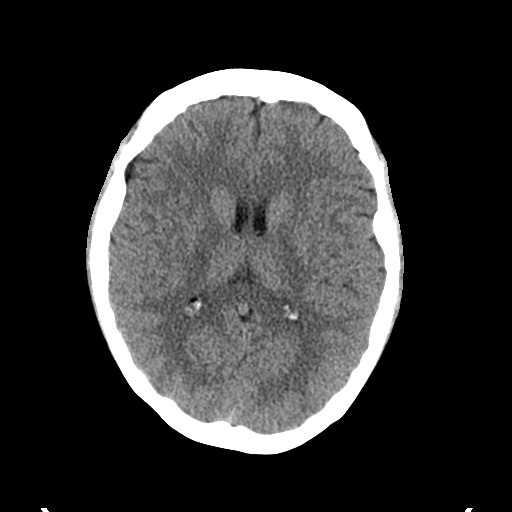
[im 20/31  brain]
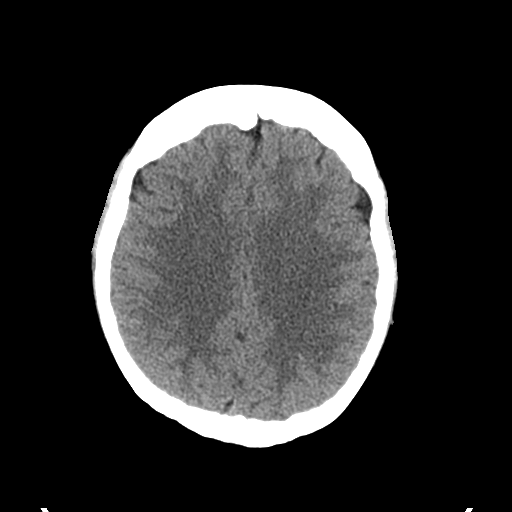
[im 23/31  brain]
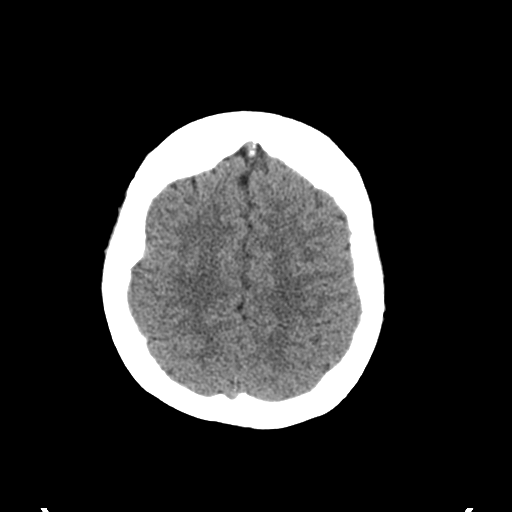
[im 25/31  brain]
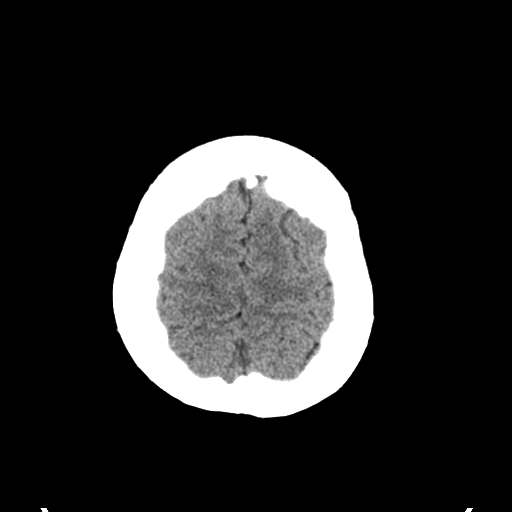
[im 25/31  bone]
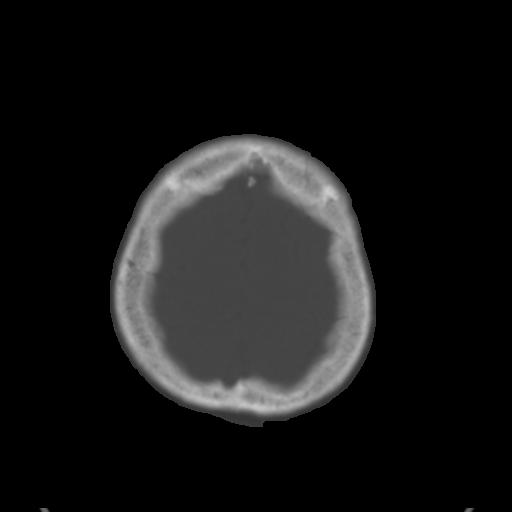
[im 28/31  brain]
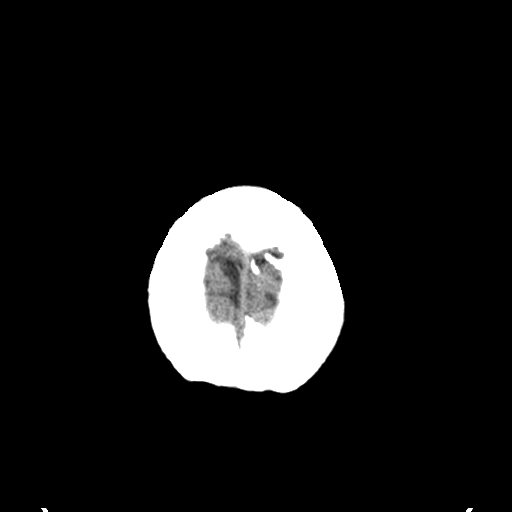

[Series 5: coronal soft tissue · coronal · 0.31mm/px · 3 of 64 slices shown]
[im 22/64  brain]
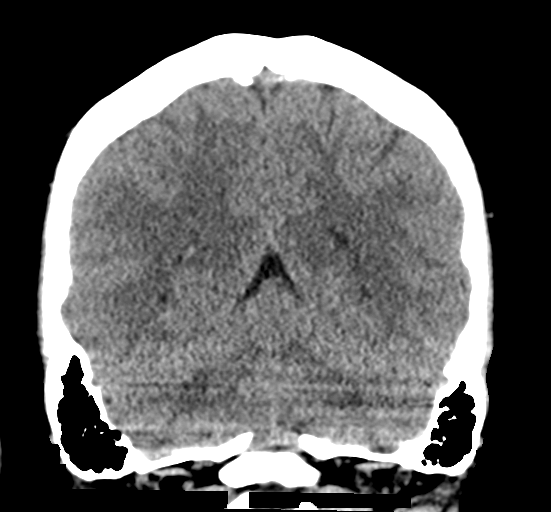
[im 29/64  brain]
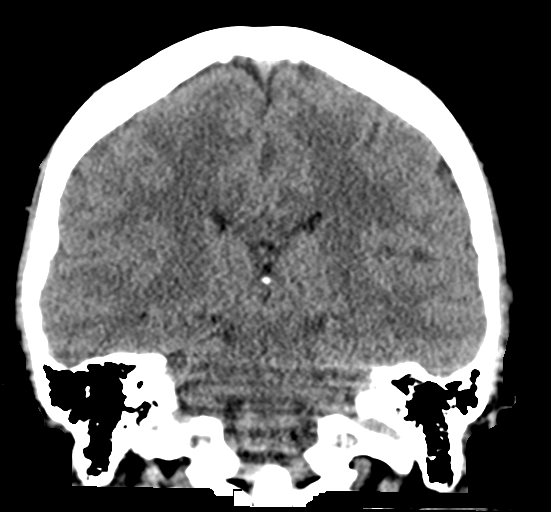
[im 36/64  brain]
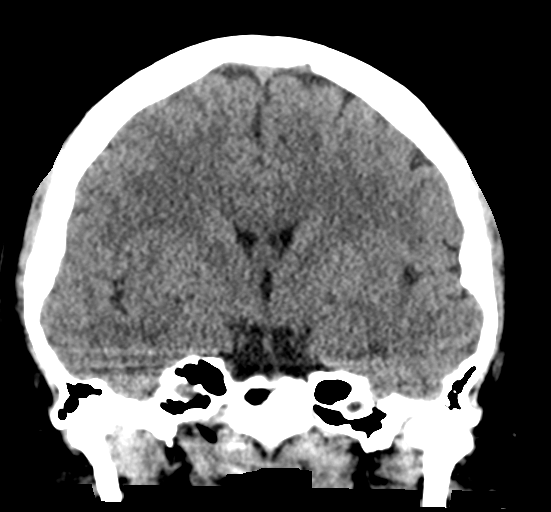

[Series 6: sagittal soft tissue · sagittal · 0.31mm/px · 3 of 57 slices shown]
[im 19/57  brain]
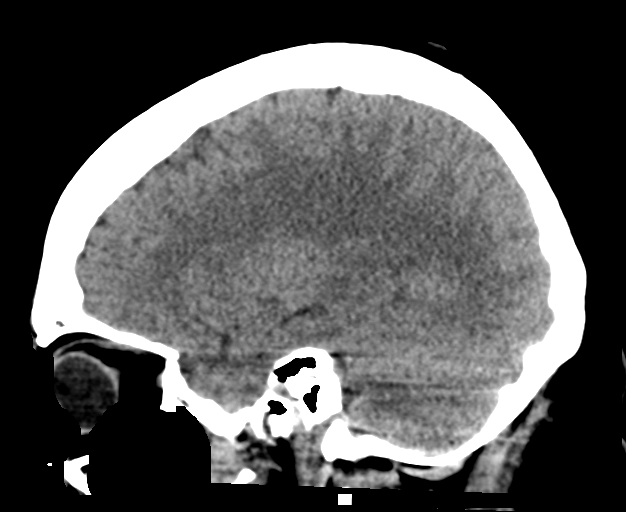
[im 29/57  brain]
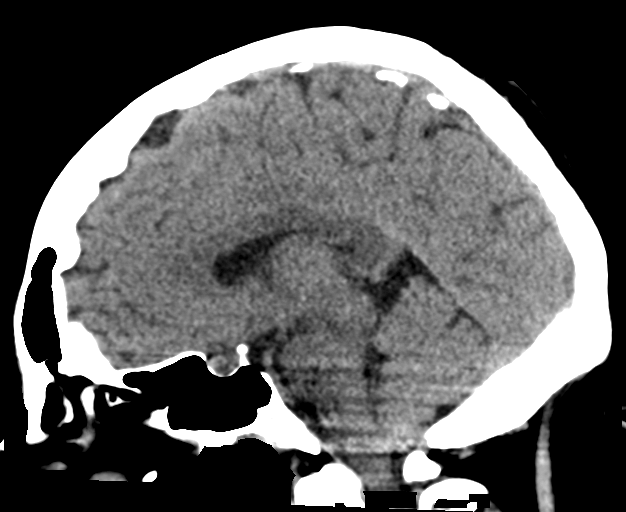
[im 38/57  brain]
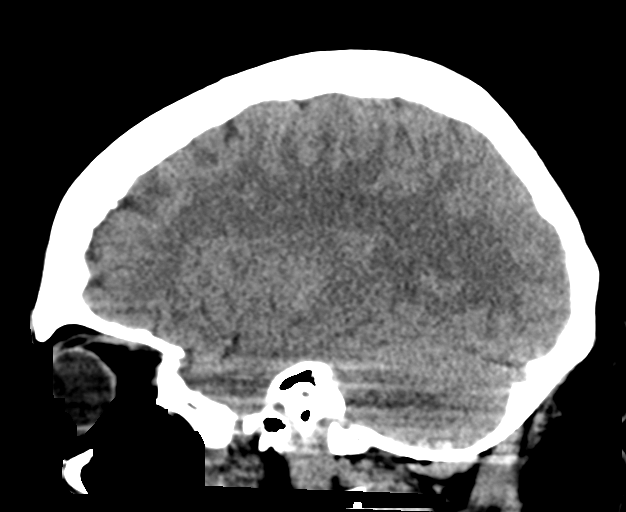

[16 of 47 positions shown; findings below may reference images not displayed]

FINDINGS: Brain: No evidence of acute infarction, hemorrhage, hydrocephalus,
extra-axial collection or mass lesion/mass effect.

Vascular: No hyperdense vessel or unexpected calcification.

Skull: Normal. Negative for fracture or focal lesion.

Sinuses/Orbits: No acute finding.

Other: None.
IMPRESSION: No acute intracranial pathology.

## 2019-07-14 MED ORDER — HYDROCOD POLST-CPM POLST ER 10-8 MG/5ML PO SUER
5.0000 mL | Freq: Once | ORAL | Status: AC
  Administered 2019-07-14: 5 mL via ORAL
  Filled 2019-07-14: qty 5

## 2019-07-14 MED ORDER — AZITHROMYCIN 250 MG PO TABS: ORAL_TABLET | ORAL | 0 refills | Status: DC

## 2019-07-14 MED ORDER — METOCLOPRAMIDE HCL 10 MG PO TABS
10.0000 mg | ORAL_TABLET | Freq: Once | ORAL | Status: AC
  Administered 2019-07-14: 15:00:00 10 mg via ORAL
  Filled 2019-07-14: qty 1

## 2019-07-14 MED ORDER — DIPHENHYDRAMINE HCL 50 MG/ML IJ SOLN
25.0000 mg | Freq: Once | INTRAMUSCULAR | Status: AC
  Administered 2019-07-14: 15:00:00 25 mg via INTRAVENOUS
  Filled 2019-07-14: qty 1

## 2019-07-14 MED ORDER — ONDANSETRON HCL 4 MG/2ML IJ SOLN: 4.0000 mg | Freq: Once | INTRAMUSCULAR | Status: DC

## 2019-07-14 MED ORDER — PROMETHAZINE HCL 25 MG/ML IJ SOLN
25.0000 mg | Freq: Once | INTRAMUSCULAR | Status: AC
  Administered 2019-07-14: 15:00:00 25 mg via INTRAVENOUS
  Filled 2019-07-14: qty 1

## 2019-07-14 MED ORDER — SODIUM CHLORIDE 0.9 % IV BOLUS
1000.0000 mL | Freq: Once | INTRAVENOUS | Status: AC
  Administered 2019-07-14: 1000 mL via INTRAVENOUS

## 2019-07-14 MED ORDER — OXYCODONE-ACETAMINOPHEN 5-325 MG PO TABS: 1.0000 | ORAL_TABLET | ORAL | 0 refills | Status: DC | PRN

## 2019-07-14 MED FILL — OXYCODONE-APAP 5-325MG: 5-325 | 3 days supply | Qty: 15 | Fill #0

## 2019-07-14 NOTE — Telephone Encounter (Signed)
Patient submitted refill request for oxycodone on the 28th.  This medication was prescribed and filled on the 26.  I called and left a message with patient to make sure the refill request was correct, and not a duplicate.  Will attempt to call patient again.

## 2019-07-14 NOTE — Discharge Instructions (Addendum)
You were seen in the ED for  body aches, headache ,cough.  I suspect you have a virus. We tested your for COVID-19 (coronavirus) infection.  It is also possible you could have other viral upper respiratory infection from another virus.    -Your chest x-ray showed a possible pneumonia.  A prescription has been sent to your pharmacy for the [place].  Please take as prescribed. -If your Covid test is negative then it is likely you have pneumonia and you should take the antibiotics. -Recommend you repeat chest x-ray in 4 weeks to recheck and make sure there are no signs of pneumonia or lung infection. -Follow-up with your primary care doctor in 2 to 5 days for symptom recheck as we discussed.  Test results come back in 48 hours, sometimes sooner.  Someone will call you if ypu are positive for COVID. If the result is negative you can see it on your MyChart.  Treatment of your illness and symptoms will include self-isolation, monitoring of symptoms and supportive care with over-the-counter medicines.    Return to the ED if there is increased work of breathing, shortness of breath, inability to tolerate fluids, weakness, chest pain.  If your test results are POSITIVE, the following isolation requirements need to be met to return to work and resume essential activities: At least 10 days since symptom onset  72 hours of absence of fever without antifever medicine (ibuprofen, acetaminophen). A fever is temperature of 100.47F or greater. Improvement of respiratory symptoms  If your test is NEGATIVE, you may return to work and essential activities as long as your symptoms have improved and you do not have a fever for a total of 3 days.  Call your job and notify them that your test result was negative to see if they will allow you to return to work.   Stay well-hydrated. Rest. You can use over the counter medications to help with symptoms: 600 mg ibuprofen (motrin, aleve, advil) or acetaminophen (tylenol) every  6 hours, around the clock to help with associated fevers, sore throat, headaches, generalized body aches and malaise.  Oxymetazoline (afrin) intranasal spray once daily for no more than 3 days to help with congestion, after 3 days you can switch to another over-the-counter nasal steroid spray such as fluticasone (flonase) Allergy medication (loratadine, cetirizine, etc) and phenylephrine (sudafed) help with nasal congestion, runny nose and postnasal drip.   Dextromethorphan (Delsym) to suppress dry cough. Frequent coughing is likely causing your chest wall pain Wash your hands often to prevent spread.

## 2019-07-14 NOTE — Telephone Encounter (Signed)
Please see refill request for oxycodone

## 2019-07-14 NOTE — Patient Instructions (Signed)
There are no preventive care reminders to display for this patient. Depression screen [facility] 2/9 07/12/2019 03/11/2019 08/17/2018  Decreased Interest 2 1 1   Down, Depressed, Hopeless 1 1 0  PHQ - 2 Score 3 2 1   Altered sleeping 3 3 3   Tired, decreased energy 1 1 1   Change in appetite 1 2 3   Feeling bad or failure about yourself  0 1 0  Trouble concentrating 3 0 0  Moving slowly or fidgety/restless 0 0 0  Suicidal thoughts 0 0 0  PHQ-9 Score 11 9 8   Difficult doing work/chores Extremely dIfficult Somewhat difficult -  Some recent data might be hidden

## 2019-07-14 NOTE — ED Provider Notes (Signed)
[name] [name] Provider Note   CSN: [id number] Arrival date & time: 07/14/19  1419     History Chief Complaint  Patient presents with  . Generalized Body Aches    [name] [name] is a 34 y.o. female with past medical history significant for anemia, anxiety, depression, GERD, migraines, PCOS presenting to emergency department today with chief complaint of generalized body aches times x2 days. She is also endorsing nonproductive cough, chills, headache. She has a history of migraines and states this started if feeling similar, however she took tylenol and the headache has not improved as they usually do. Headache has progressively worsened since onset. She denies any associated neck pain. As her headache has worsened she is also having blurry vision when trying to read words close up, no difficulty with distance vision. She does not wear glasses or contacts.  She is also reporting mouth pain.  She states she has a history of cold sores in takes Valtrex when she has an outbreak.  She noticed an outbreak that started yesterday and she took Valtrex last night, she has taken Valtrex in the past. She is currently taking Percocet for back pain, however has not taken any today. She works for [employer]. She has received both vaccinations for covid and denies any contact with anyone known to be covid positive. Denies fever, syncope, head trauma, photophobia, phonophobia, UL throbbing, N/V, stiff neck, neck pain, rash, or "thunderclap" onset, rash.     Past Medical History:  Diagnosis Date  . Anemia   . Anxiety   . DEPRESSION   . Family history of colon cancer May 28, 2019   Father, 62s; deceased.   Marland Kitchen GERD (gastroesophageal reflux disease)   . Hypothyroidism    Hx of, normalized TSH during pregnancy 2011  . Infertility, female   . Migraines   . Morbid obesity (HCC)    s/p RY 08/2012 - start weight 290 pounds  . Nephrolithiasis   . PCOS (polycystic ovarian  syndrome)   . PONV (postoperative nausea and vomiting)   . Pregnancy induced hypertension     Patient Active Problem List   Diagnosis Date Noted  . Vitamin B12 deficiency 06/01/2019  . Vitamin D deficiency 06/01/2019  . Panic disorder 06/01/2019  . Family history of colon cancer 05/28/2019  . Chronic constipation 28-May-2019  . Major depression, recurrent, chronic (HCC) 28-May-2019  . Insomnia due to other mental disorder 03/13/2019  . Pain in both lower extremities 11/21/2018  . Nocturnal leg cramps 10/26/2018  . Nephrolithiasis   . Reduced libido, due to Lexapro 07/09/2017  . Binge eating disorder 04/23/2017  . Morbid obesity (HCC) 04/23/2017  . Acquired iron deficiency anemia due to decreased absorption 02/22/2015  . 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 Quintella Reichert OSTEOTOMY Left 06/24/2017   Procedure: Ralene Bathe;  Surgeon: [name], DPM;  Location: [facility];  Service: Podiatry;  Laterality: Left;  . BUNIONECTOMY Left 06/24/2017   Procedure: Anthoney Harada;  Surgeon: [name], DPM;  Location: [facility];  Service: Podiatry;  Laterality: Left;  . CESAREAN SECTION     x 2  . CHOLECYSTECTOMY N/A 02/15/2019   Procedure: LAPAROSCOPIC CHOLECYSTECTOMY WITH INTRAOPERATIVE CHOLANGIOGRAM;  Surgeon: Andria Meuse, MD;  Location: [facility];  Service: General;  Laterality: N/A;  . FOOT SURGERY    . 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   Procedure: LAPAROSCOPIC ROUX-EN-Y GASTRIC;  Surgeon:  Atilano Ina, MD;  Location: [facility];  Service: General;  Laterality: N/A;  laparoscopic roux-en-y gastric bypass  . LITHOTRIPSY Left   . TONSILLECTOMY    . TUBAL LIGATION    . UPPER GI ENDOSCOPY  08/24/2012   Procedure: UPPER GI ENDOSCOPY;  Surgeon: Atilano Ina, MD;  Location: [facility];  Service: General;;  . WISDOM TOOTH EXTRACTION       OB History    Gravida  2   Para  2   Term    2   Preterm  0   AB  0   Living  2     SAB  0   TAB  0   Ectopic  0   Multiple  0   Live Births  2           Family History  Problem Relation Age of Onset  . Hyperlipidemia Father   . Hypertension Father   . Kidney disease Father   . Colon cancer Father 72  . Hypertension Mother   . Hypertension Maternal Grandmother   . Uterine cancer Maternal Grandmother 69  . Diabetes Maternal Grandfather     Social History   Tobacco Use  . Smoking status: Never Smoker  . Smokeless tobacco: Never Used  Substance Use Topics  . Alcohol use: Yes    Comment: rare/socially  . Drug use: No    Home Medications Prior to Admission medications   Medication Sig Start Date End Date Taking? Authorizing Provider  acetaminophen (TYLENOL) 500 MG tablet Take 2 tablets (1,000 mg total) by mouth every 6 (six) hours as needed for moderate pain. 02/13/19  Yes Fayrene Helper, PA-C  ALPRAZolam Prudy Feeler) 1 MG tablet Take 1-2 tablets (1-2 mg total) by mouth at bedtime as needed for anxiety. 06/15/19  Yes Willow Ora, MD  CALCIUM-MAGNESIUM-ZINC PO Take 3 tablets by mouth daily.   Yes [provider]  cyanocobalamin (,VITAMIN B-12,) 1000 MCG/ML injection Inject 1 vial per week for 3 weeks, then 1 vial per month thereafter Patient taking differently: Inject 100 mcg into the skin once a week. Tuesdays 05/30/19  Yes Willow Ora, MD  escitalopram (LEXAPRO) 20 MG tablet Take 1 tablet (20 mg total) by mouth daily. 03/11/19  Yes Helane Rima, DO  ferrous sulfate 325 (65 FE) MG tablet Take 325 mg by mouth 2 (two) times daily with a meal.    Yes [provider]  gabapentin (NEURONTIN) 300 MG capsule One tab PO qHS for a week, then BID for a week, then TID. May double weekly to a max of 3,600mg /day Patient taking differently: Take 300 mg by mouth 2 (two) times daily.  07/05/19  Yes Rodolph Bong, MD  lamoTRIgine (LAMICTAL) 25 MG tablet Take 50 mg by mouth daily.   Yes [provider]  Menaquinone-7 (VITAMIN K2 PO) Take 1 capsule by mouth daily.    Yes [provider]  Multiple Vitamins-Iron (ONE-TABLET-DAILY/IRON PO) Take 1 tablet by mouth daily.   Yes [provider]  ondansetron (ZOFRAN-ODT) 4 MG disintegrating tablet Take 1 tablet (4 mg total) by mouth every 6 (six) hours as needed for nausea. 02/16/19  Yes Maczis, Elmer Sow, PA-C  oxyCODONE-acetaminophen (PERCOCET/ROXICET) 5-325 MG tablet Take 1 tablet by mouth every 4 (four) hours as needed for severe pain. 07/14/19  Yes Rodolph Bong, MD  pantoprazole (PROTONIX) 40 MG tablet Take 1 tablet (40 mg total) by mouth daily. 01/25/19  Yes Esterwood, Amy S, PA-C  tizanidine (ZANAFLEX) 2 MG capsule Take 2 capsules (4 mg total) by mouth 3 (three) times daily. 07/12/19  Yes Rodolph Bongorey, Evan S, MD  valACYclovir (VALTREX) 1000 MG tablet Take 1 tablet (1,000 mg total) by mouth 3 (three) times daily. Patient taking differently: Take 1,000 mg by mouth 3 (three) times daily. AS NEEDED FOR OUTBREAKS 05/25/19  Yes Martin, Mary-Margaret, FNP  Vitamin D, Ergocalciferol, (DRISDOL) 1.25 MG (50000 UT) CAPS capsule Take 50,000 Units by mouth once a week. Tuesday   Yes [provider]  azithromycin (ZITHROMAX Z-PAK) 250 MG tablet Take 2 tablets (500 mg total) by mouth daily for 1 day, THEN 1 tablet (250 mg total) daily for 4 days. 07/14/19 07/19/19  Delphine Sizemore E, PA-C  busPIRone (BUSPAR) 7.5 MG tablet Take 1 tablet (7.5 mg total) by mouth 3 (three) times daily as needed. Patient not taking: Reported on 07/14/2019 06/01/19   Willow OraAndy, Camille L, MD  diclofenac Sodium (VOLTAREN) 1 % GEL Apply 4 g topically 4 (four) times daily. Patient not taking: Reported on 07/14/2019 05/13/19   Palumbo, April, MD  HYDROcodone-acetaminophen (NORCO/VICODIN) 5-325 MG tablet Take 1 tablet by mouth every 6 (six) hours as needed. Patient not taking: Reported on 07/14/2019 07/11/19   Rodolph Bongorey, Evan S, MD  LORazepam (ATIVAN) 0.5 MG tablet 1-2 tabs  30 - 60 min prior to MRI. Do not drive with this medicine. Patient not taking: Reported on 07/14/2019 07/12/19   Rodolph Bongorey, Evan S, MD  promethazine (PHENERGAN) 25 MG tablet Take 1 tablet (25 mg total) by mouth every 8 (eight) hours as needed for nausea or vomiting. 06/18/19   Eulis FosterWebb, Padonda B, FNP  SYRINGE-NEEDLE, DISP, 3 ML 25G X 1" 3 ML MISC Use 1 needle per application once weekly 05/30/19   Willow OraAndy, Camille L, MD    Allergies    Ambien [zolpidem] and Nsaids  Review of Systems   Review of Systems  All other systems are reviewed and are negative for acute change except as noted in the HPI.   Physical Exam Updated Vital Signs BP (!) 145/112 (BP Location: Left Arm)   Pulse (!) 127   Temp 98.5 F (36.9 C) (Oral)   Resp 18   SpO2 98%   Physical Exam Vitals and nursing note reviewed.  Constitutional:      General: She is not in acute distress.    Appearance: She is not ill-appearing.  HENT:     Head: Normocephalic and atraumatic.     Comments: No sinus or temporal tenderness.    Right Ear: Tympanic membrane and external ear normal.     Left Ear: Tympanic membrane and external ear normal.     Nose: Nose normal.     Mouth/Throat:     Mouth: Mucous membranes are dry.     Dentition: No gum lesions.     Tongue: No lesions.     Palate: No lesions.     Pharynx: Oropharynx is clear.     Comments: Cold sores noted to bottom lip. No drainage, bleeding, no surrounding erythema Eyes:     General: No scleral icterus.       Right eye: No discharge.        Left eye: No discharge.     Extraocular Movements: Extraocular movements intact.     Conjunctiva/sclera: Conjunctivae normal.     Pupils: Pupils are equal, round, and reactive to light.  Neck:     Vascular: No JVD.     Comments: Full ROM intact without spinous  process TTP. No bony stepoffs or deformities, no paraspinous muscle TTP or muscle spasms. No rigidity or meningeal signs. No bruising, erythema, or swelling.  Cardiovascular:     Rate  and Rhythm: Regular rhythm. Tachycardia present.     Pulses: Normal pulses.          Radial pulses are 2+ on the right side and 2+ on the left side.     Heart sounds: Normal heart sounds.     Comments: Heart rate during exam 109-120 Pulmonary:     Comments: Lungs clear to auscultation in all fields. Symmetric chest rise. No wheezing, rales, or rhonchi. Patient with dry hacking cough during exam. No hypoxia. Abdominal:     Comments: Abdomen is soft, non-distended, and non-tender in all quadrants. No rigidity, no guarding. No peritoneal signs.  Musculoskeletal:        General: Normal range of motion.     Cervical back: Normal range of motion.  Skin:    General: Skin is warm and dry.     Capillary Refill: Capillary refill takes less than 2 seconds.  Neurological:     Mental Status: She is oriented to person, place, and time.     GCS: GCS eye subscore is 4. GCS verbal subscore is 5. GCS motor subscore is 6.     Comments: Fluent speech, no facial droop.   Speech is clear and goal oriented, follows commands CN III-XII intact, no facial droop Normal strength in upper and lower extremities bilaterally including dorsiflexion and plantar flexion, strong and equal grip strength Sensation normal to light and sharp touch Moves extremities without ataxia, coordination intact Normal finger to nose and rapid alternating movements Normal gait and balance   Psychiatric:        Behavior: Behavior normal.       ED Results / Procedures / Treatments   Labs (all labs ordered are listed, but only abnormal results are displayed) Labs Reviewed  BASIC METABOLIC PANEL - Abnormal; Notable for the following components:      Result Value   Calcium 8.6 (*)    All other components within normal limits  CBC WITH DIFFERENTIAL/PLATELET - Abnormal; Notable for the following components:   WBC 12.3 (*)    Hemoglobin 11.4 (*)    RDW 16.7 (*)    Neutro Abs 9.0 (*)    All other components within normal limits   SARS CORONAVIRUS 2 (TAT 6-24 HRS)  POC SARS CORONAVIRUS 2 AG -  ED    EKG EKG Interpretation  Date/Time:  Thursday July 14 2019 15:24:46 EST Ventricular Rate:  116 PR Interval:    QRS Duration: 84 QT Interval:  345 QTC Calculation: 480 R Axis:   76 Text Interpretation: Sinus tachycardia Borderline prolonged QT interval Baseline wander in lead(s) V2 No significant change since last tracing Confirmed by Melene Plan [phone]) on 07/14/2019 7:17:33 PM   Radiology CT Head Wo Contrast  Result Date: 07/14/2019 CLINICAL DATA:  Blurry vision with headache. EXAM: CT HEAD WITHOUT CONTRAST TECHNIQUE: Contiguous axial images were obtained from the base of the skull through the vertex without intravenous contrast. COMPARISON:  January 28, 2019 FINDINGS: Brain: No evidence of acute infarction, hemorrhage, hydrocephalus, extra-axial collection or mass lesion/mass effect. Vascular: No hyperdense vessel or unexpected calcification. Skull: Normal. Negative for fracture or focal lesion. Sinuses/Orbits: No acute finding. Other: None. IMPRESSION: No acute intracranial pathology. Electronically Signed   By: Aram Candela M.D.   On: 07/14/2019 16:51   DG Chest Portable 1  View  Result Date: 07/14/2019 CLINICAL DATA:  Cough with body aches EXAM: PORTABLE CHEST 1 VIEW COMPARISON:  01/28/2019 FINDINGS: Low lung volumes. Mild cardiomegaly with central vascular congestion. Hazy perihilar opacity. No pleural effusion or pneumothorax. IMPRESSION: 1. Low lung volumes with cardiomegaly and central vascular congestion. 2. Hazy perihilar opacity could reflect mild perihilar edema or potential atypical/viral infection. Electronically Signed   By: Jasmine PangKim  Fujinaga M.D.   On: 07/14/2019 16:04    Procedures Procedures (including critical care time)  Medications Ordered in ED Medications  sodium chloride 0.9 % bolus 1,000 mL (0 mLs Intravenous Stopped 07/14/19 1753)  promethazine (PHENERGAN) injection 25 mg (25 mg  Intravenous Given 07/14/19 1521)  metoCLOPramide (REGLAN) tablet 10 mg (10 mg Oral Given 07/14/19 1521)  diphenhydrAMINE (BENADRYL) injection 25 mg (25 mg Intravenous Given 07/14/19 1521)  chlorpheniramine-HYDROcodone (TUSSIONEX) 10-8 MG/5ML suspension 5 mL (5 mLs Oral Given 07/14/19 1758)    ED Course  I have reviewed the triage vital signs and the nursing notes.  Pertinent labs & imaging results that were available during my care of the patient were reviewed by me and considered in my medical decision making (see chart for details). Vitals:   07/14/19 1600 07/14/19 1630 07/14/19 1758 07/14/19 1900  BP: (!) 145/87 (!) 132/91  126/89  Pulse: (!) 109 (!) 110 (!) 109 (!) 108  Resp: 16 16 12 16   Temp:      TempSrc:      SpO2: 96% 94% 96% 93%       MDM Rules/Calculators/A&P                      Patient seen and examined. Patient presents awake, alert, hemodynamically stable, afebrile, non toxic. Patient tachycardic to 127 in triage. On my exam HR ranging from 109-120, she is usually around 110 per patient. Lungs are clear to auscultation in all fields. No hypoxia. She is reporting feeling dizzy when changing positions, however this is not new for her and happens sometimes with her headaches. She appears dehydrated, dry mucus membranes and lips. She has a cold sore on her bottom lip currently being treated with valtrex. No signs of infection or super imposed infection, do not see signs of SJS on exam.   Neuro exam normal. Given blurry vision complaint CT head ordered and is negative. Patient given IV reglan and benadryl. On reassessment she is sleeping. After waking up patient tells me her headache has significantly improved. Presentation is non concerning for [facility], ICH, Meningitis, or temporal arteritis. Pt is afebrile with no focal neuro deficits, nuchal rigidity. Blurry vision resolved with headache treatment. Ambulates with steady gait, no dizziness.   Labs here show leukocytosis of 12.2,  hemoglobin consistent with baseline.  Severe electrolyte derangements, no renal insufficiency.  Rapid Covid test is negative.  Chest x-ray viewed by me shows possible pneumonia with hazy perihilar opacity.  EKG without stemi, no changes from prior.  Engaged in searching making with patient with possible findings of pneumonia.  Her PCR Covid test is pending however feel that it is less likely she has Covid as she has been vaccinated however I cannot yet confirm this.  Will discharge home with prescriptions for Z-Pak to cover for possible community acquired pneumonia.  Patient is feeling much better and requesting to be discharged home.  She is asking for a prescription for test next.  As she is currently taking Percocet for back pain did not feel comfortable prescribing this.  Also reviewed  PDMP and she has a high overdose score of 710.  She has not taken Percocet today so we will give 1 dose of Tussionex here in the emergency department. Discussed importance of staying well-hydrated and over-the-counter cough medications.  Patient aware to self quarantine until she has a Covid test result. The patient appears reasonably screened and/or stabilized for discharge and I doubt any other medical condition or other [facility] requiring further screening, evaluation, or treatment in the ED at this time prior to discharge. The patient is safe for discharge with strict return precautions discussed. Recommend pcp follow up symptoms persist.  Ailey Wessling Horseman was evaluated in Emergency Department on 07/15/2019 for the symptoms described in the history of present illness. She was evaluated in the context of the global COVID-19 pandemic, which necessitated consideration that the patient might be at risk for infection with the SARS-CoV-2 virus that causes COVID-19. Institutional protocols and algorithms that pertain to the evaluation of patients at risk for COVID-19 are in a state of rapid change based on information released by  regulatory bodies including the CDC and federal and state organizations. These policies and algorithms were followed during the patient's care in the ED.   Portions of this note were generated with [employer]. Dictation errors may occur despite best attempts at proofreading.     Final Clinical Impression(s) / ED Diagnoses Final diagnoses:  Suspected COVID-19 virus infection  Acute nonintractable headache, unspecified headache type    Rx / DC Orders ED Discharge Orders         Ordered    azithromycin (ZITHROMAX Z-PAK) 250 MG tablet     07/14/19 1936           Flint Melter 07/15/19 1249    Dorie Rank, MD 07/18/19 [phone]

## 2019-07-14 NOTE — Telephone Encounter (Signed)
Rx sent 

## 2019-07-14 NOTE — ED Triage Notes (Signed)
Pt presents with c/o generalized body aches. Pt reports she had a virtual visit for Covid symptoms and was advised to come here. Pt reports she feels off balance and just doesn't feel well.

## 2019-07-14 NOTE — ED Notes (Signed)
Alissandra Geoffroy would like an update on his wife, [phone].

## 2019-07-14 NOTE — Telephone Encounter (Signed)
Called pt and she only had 3 pills left so does need another refill on her oxycodone

## 2019-07-14 NOTE — Progress Notes (Signed)
Phone [phone] Virtual visit via Video note   Subjective:  Chief complaint: Chief Complaint  Patient presents with  . virtual  . Cough  . Headache  . chills   This visit type was conducted due to national recommendations for restrictions regarding the COVID-19 Pandemic (e.g. social distancing).  This format is felt to be most appropriate for this patient at this time balancing risks to patient and risks to population by having him in for in person visit.  No physical exam was performed (except for noted visual exam or audio findings with Telehealth visits).    Our team/I connected with [name] [name] at  1:00 PM EST by a video enabled telemedicine application (doxy.me or caregility through epic) and verified that I am speaking with the correct person using two identifiers.  Location patient: [facility] Location provider: [facility], office Persons participating in the virtual visit:  patient  Our team/I discussed the limitations of evaluation and management by telemedicine and the availability of in person appointments. In light of current covid-19 pandemic, patient also understands that we are trying to protect them by minimizing in office contact if at all possible.  The patient expressed consent for telemedicine visit and agreed to proceed. Patient understands insurance will be billed.   Past Medical History-  Patient Active Problem List   Diagnosis Date Noted  . Vitamin B12 deficiency 06/01/2019  . Vitamin D deficiency 06/01/2019  . Panic disorder 06/01/2019  . Family history of colon cancer 05/16/2019  . Chronic constipation 05/16/2019  . Major depression, recurrent, chronic ([place]) 05/16/2019  . Insomnia due to other mental disorder 03/13/2019  . Pain in both lower extremities 11/21/2018  . Nocturnal leg cramps 10/26/2018  . Nephrolithiasis   . Reduced libido, due to Lexapro 07/09/2017  . Binge eating disorder 04/23/2017  . Morbid obesity ([place]) 04/23/2017  . Acquired  iron deficiency anemia due to decreased absorption 02/22/2015  . 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 acetaminophen (TYLENOL) 500 MG tablet Take 2 tablets (1,000 mg total) by mouth every 6 (six) hours as needed for moderate pain. 30 tablet 0  . ALPRAZolam (XANAX) 1 MG tablet Take 1-2 tablets (1-2 mg total) by mouth at bedtime as needed for anxiety. 60 tablet 2  . busPIRone (BUSPAR) 7.5 MG tablet Take 1 tablet (7.5 mg total) by mouth 3 (three) times daily as needed. 60 tablet 2  . cyanocobalamin (,VITAMIN B-12,) 1000 MCG/ML injection Inject 1 vial per week for 3 weeks, then 1 vial per month thereafter 6 mL 3  . diclofenac Sodium (VOLTAREN) 1 % GEL Apply 4 g topically 4 (four) times daily. 100 g 0  . escitalopram (LEXAPRO) 20 MG tablet Take 1 tablet (20 mg total) by mouth daily. 90 tablet 2  . ferrous sulfate 325 (65 FE) MG tablet Iron (ferrous sulfate) 325 mg (65 mg iron) tablet  Take 1 tablet every day by oral route.    . gabapentin (NEURONTIN) 300 MG capsule One tab PO qHS for a week, then BID for a week, then TID. May double weekly to a max of 3,600mg /day 180 capsule 3  . HYDROcodone-acetaminophen (NORCO/VICODIN) 5-325 MG tablet Take 1 tablet by mouth every 6 (six) hours as needed. 15 tablet 0  . lamoTRIgine (LAMICTAL) 25 MG tablet Take 50 mg by mouth daily.    Marland Kitchen LORazepam (ATIVAN) 0.5 MG tablet 1-2 tabs 30 - 60 min  prior to MRI. Do not drive with this medicine. 4 tablet 0  . Menaquinone-7 (VITAMIN K2 PO) Take 1 capsule by mouth daily.     . Multiple Vitamins-Iron (ONE-TABLET-DAILY/IRON PO) Take 1 tablet by mouth daily.    . ondansetron (ZOFRAN-ODT) 4 MG disintegrating tablet Take 1 tablet (4 mg total) by mouth every 6 (six) hours as needed for nausea. 20 tablet 0  . oxyCODONE-acetaminophen (PERCOCET/ROXICET) 5-325 MG tablet Take 1 tablet by mouth every 4 (four) hours as needed for  severe pain. 15 tablet 0  . pantoprazole (PROTONIX) 40 MG tablet Take 1 tablet (40 mg total) by mouth daily. 30 tablet 3  . SYRINGE-NEEDLE, DISP, 3 ML 25G X 1" 3 ML MISC Use 1 needle per application once weekly 12 each 0  . tizanidine (ZANAFLEX) 2 MG capsule Take 2 capsules (4 mg total) by mouth 3 (three) times daily. 90 capsule 0  . valACYclovir (VALTREX) 1000 MG tablet Take 1 tablet (1,000 mg total) by mouth 3 (three) times daily. (Patient taking differently: Take 1,000 mg by mouth 3 (three) times daily. AS NEEDED FOR OUTBREAKS) 21 tablet 0  . CALCIUM-MAGNESIUM-ZINC PO Take 3 tablets by mouth daily.    . promethazine (PHENERGAN) 25 MG tablet Take 1 tablet (25 mg total) by mouth every 8 (eight) hours as needed for nausea or vomiting. 20 tablet 0  . Vitamin D, Ergocalciferol, (DRISDOL) 1.25 MG (50000 UT) CAPS capsule Take 50,000 Units by mouth every Monday.     No current facility-administered medications for this visit.     Objective:  BP (!) 150/100   Temp (!) 100.5 F (38.1 C)   Ht 5\' 4"  (1.626 m)   Wt 260 lb (117.9 kg)   BMI 44.63 kg/m  self reported vitals Gen: NAD,  Appears fatigued Lungs: nonlabored, normal respiratory rate  Skin: appears dry, no obvious rash     Assessment and Plan   Cough, chills,HA S: pt c/o fever 100.5 since yesterday, cough, chills and headache yesterday. 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 Placed on percocet for back pain- has been on percocet before.   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  She got tested yesterday morning. 6 people that works in her building and they are really cautious- they work at clinic and not sure about screening accuracy for all patients.   She has raspy breathing- using an inhaler. No pulse ox at home- not reporting shortness of rbeath.   Pt still has taste and smell however her tongue burns every time she eats and her appetite is not very well.   She states the pain can be somewhat severe and has made eating difficult.   Pt has been covid tested and she is waiting on the results from yesterday and she has had the vaccine . A/P: 34 year old female  with cough, chills, headache.  This certainly could be COVID-19 and she is undergoing testing.  She has already been vaccinated per nursing report.  She has 2 atypical symptoms for which I want her to get evaluated in person-she has blurry vision with reading up close-denies any issues at a distance and she also has significant diffuse mouth pain.  She reports changes to the oral mucosa as well.  I cannot rule out something like Stevens-Johnson.  For that reason I have suggested she go to the emergency room-encouraged her to call her husband who is currently at work to  help facilitate this as she has children-she wanted to use a neighbor but I suggested using husband if possible in case she does have COVID-19 -She is currently on Percocet for her back pain but reports still having back pain and explains this is the reason her blood pressure is elevated but interestingly blood pressure was normal when seen 2 days ago by Dr. Denyse Amass.  She requested Tussionex for cough but I explained that I would not use Tussionex along with Percocet.  Recommended follow up: -Proceed immediately to emergency room  Lab/Order associations:   ICD-10-CM   1. Blurry vision  H53.8   2. Sore mouth  K13.79   3. Fever, unspecified fever cause  R50.9   4. Cough  R05    Return precautions advised.  Tana Conch, MD

## 2019-07-14 NOTE — ED Notes (Signed)
Patient ambulated in room, oxygen saturation ranged from 95-100% RA.

## 2019-07-15 ENCOUNTER — Encounter: Payer: Self-pay | Admitting: Family Medicine

## 2019-07-15 ENCOUNTER — Telehealth: Payer: Self-pay

## 2019-07-15 LAB — SARS CORONAVIRUS 2 (TAT 6-24 HRS): SARS Coronavirus 2: NEGATIVE

## 2019-07-15 MED ORDER — MAGIC MOUTHWASH W/LIDOCAINE: 5.0000 mL | Freq: Three times a day (TID) | ORAL | 0 refills | Status: AC | PRN

## 2019-07-15 MED ORDER — GUAIFENESIN-CODEINE 100-10 MG/5ML PO SOLN: 5.0000 mL | Freq: Four times a day (QID) | ORAL | 0 refills | Status: DC | PRN

## 2019-07-15 MED FILL — GUAIATUSSIN AC LIQUID: 100-10 | 6 days supply | Qty: 120 | Fill #0

## 2019-07-15 MED FILL — MAGIC MTHWASH W/LIDOCAINE: 5 days supply | Qty: 150 | Fill #0

## 2019-07-15 NOTE — Telephone Encounter (Signed)
Monica from [employer] calling to gather some additional information regarding magic mouthwash w/lidocaine SOLN please call [phone]

## 2019-07-15 NOTE — Telephone Encounter (Signed)
Added to open message.  

## 2019-07-15 NOTE — Telephone Encounter (Signed)
Added from another message.  I found so yogurt in the refrigerator . I may need need more valtrex.  [name] [name] has an endoscopy @ 1, so anytime you. Get fit me  In before I would appreciate it. I just don't think the ER doctors wanted to break protocol with precocet and tussionex BUT I NEED RELIEF!!!  I'll call in the morning to see if there are any cancellations!  Thanks,  [name] [name]

## 2019-07-15 NOTE — Telephone Encounter (Signed)
Called pharmacy back they have filled for her with no further questions.

## 2019-07-15 NOTE — Telephone Encounter (Signed)
Pt called follow up on response from Dr. Mardelle Matte. Pt asked if she could have a strep test done. Please advise.

## 2019-07-15 NOTE — Telephone Encounter (Signed)
Monica from [facility] calling to gather some additional information regarding magic mouthwash w/lidocaine SOLN please call [phone] 

## 2019-07-15 NOTE — Telephone Encounter (Signed)
Please call patient: Oral ulcers: likely not improved with valtrex: can use magic mouthwash or viscous lidocaine or anbesol if needed. IF cold sores on lips, can refill valtrex: 2000mg  bid x 1 day is the dose for this.   For cough: robitussin AC (with codeine if needed). I don't prescribe tussionex.   Supportive care. F/u next week if needed

## 2019-07-15 NOTE — Telephone Encounter (Signed)
error 

## 2019-07-16 ENCOUNTER — Ambulatory Visit (INDEPENDENT_AMBULATORY_CARE_PROVIDER_SITE_OTHER): Payer: No Typology Code available for payment source

## 2019-07-16 ENCOUNTER — Encounter: Payer: Self-pay | Admitting: Family Medicine

## 2019-07-16 DIAGNOSIS — M5416 Radiculopathy, lumbar region: Secondary | ICD-10-CM

## 2019-07-16 IMAGING — MR MR LUMBAR SPINE W/O CM
4 of 5 series · 26 of 48 positions shown · non-contrast
Comparison: CT [DATE]

CLINICAL DATA: Low back pain. Left posterior leg pain, weakness and
numbness over the last 2 weeks.

EXAM:
MRI LUMBAR SPINE WITHOUT CONTRAST
TECHNIQUE: Multiplanar, multisequence MR imaging of the lumbar spine was
performed. No intravenous contrast was administered.

[Series 2: T2 · sagittal · 4.0mm · 0.81mm/px · 6 of 15 slices shown (1 of 2)]
[im 1/15]
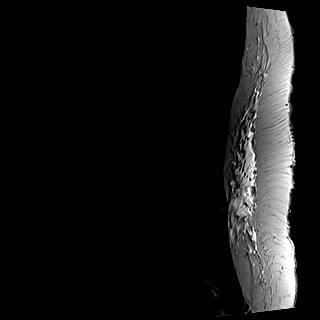
[im 3/15]
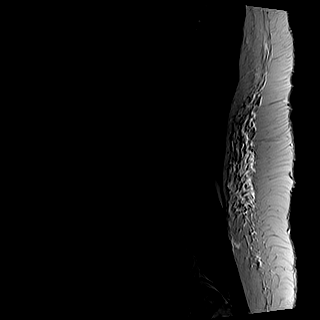
[im 6/15]
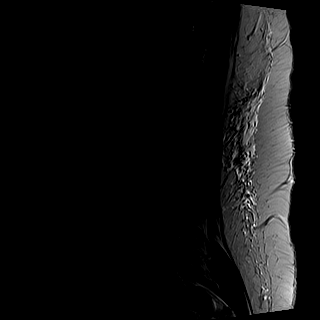
[im 9/15]
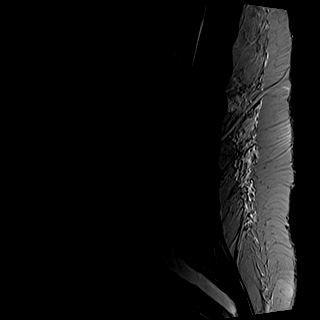
[im 12/15]
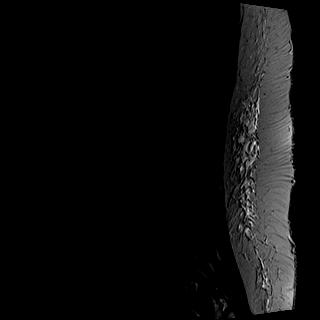
[im 15/15]
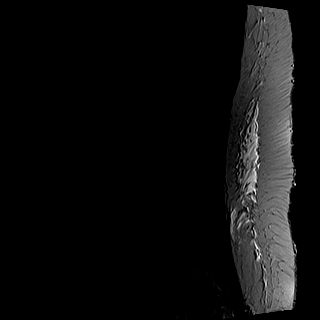

[Series 3: T1 · sagittal · 4.0mm · 0.41mm/px · 6 of 15 slices shown (1 of 2)]
[im 1/15]
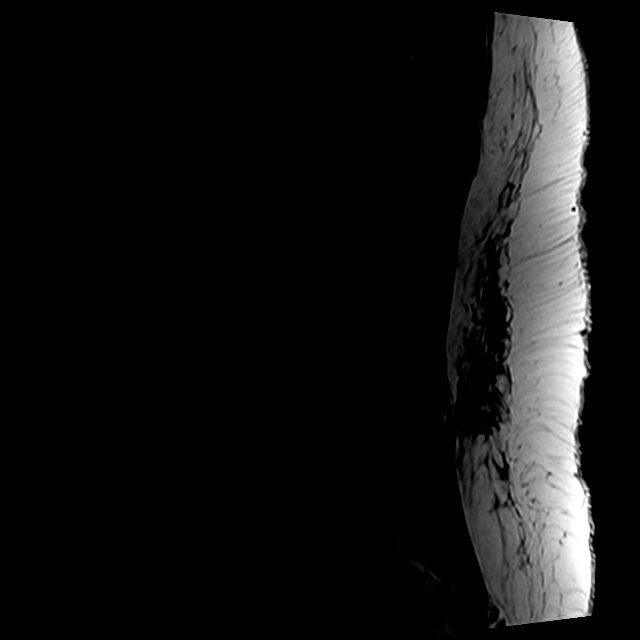
[im 3/15]
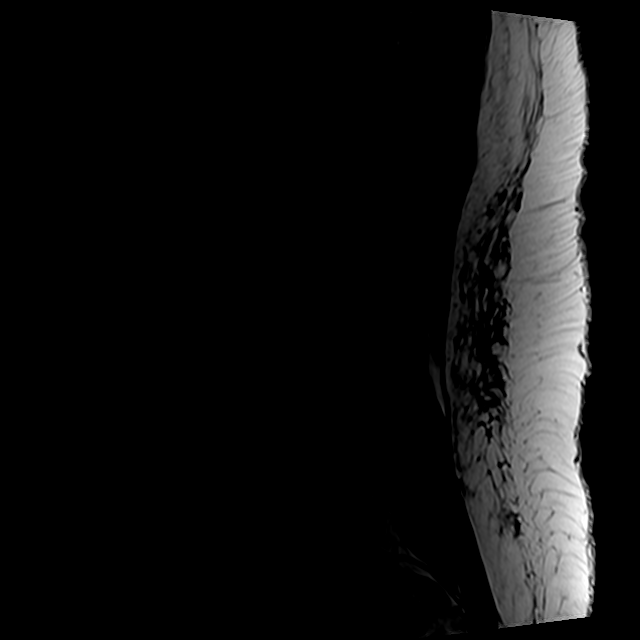
[im 6/15]
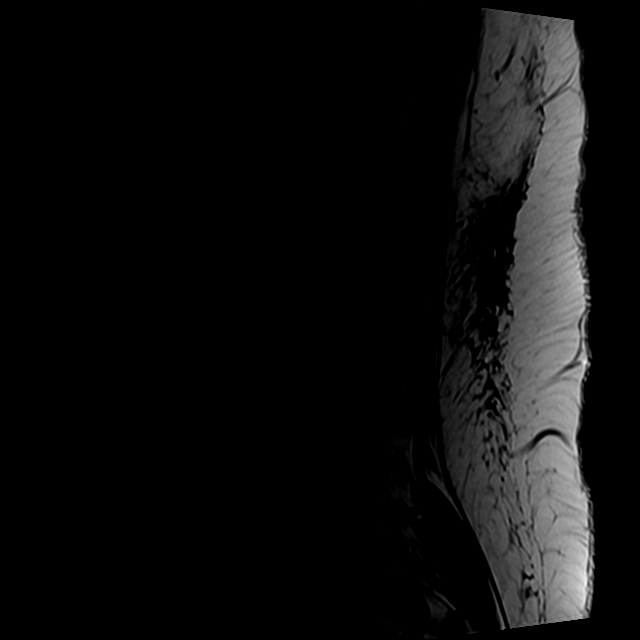
[im 9/15]
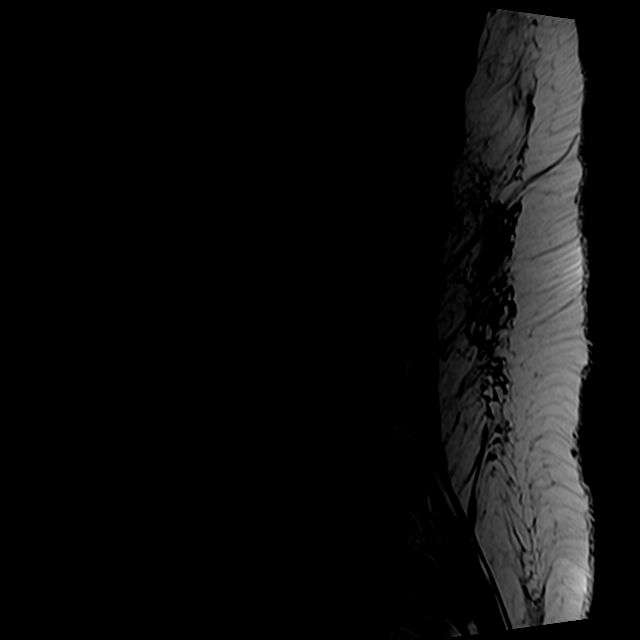
[im 12/15]
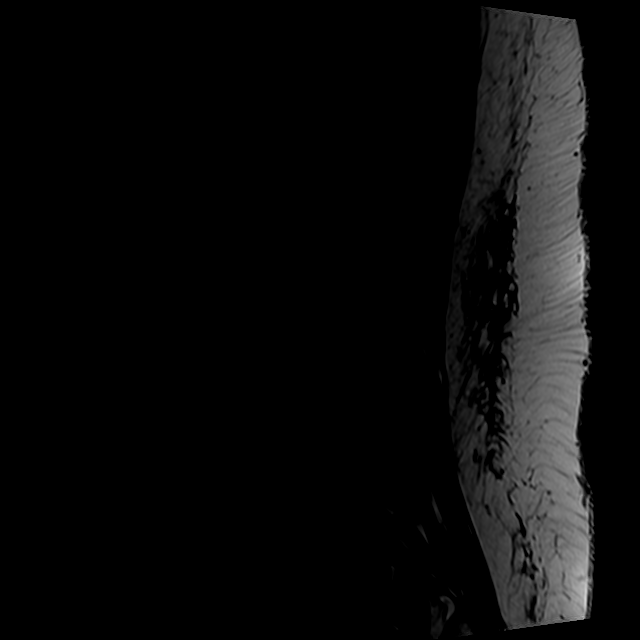
[im 15/15]
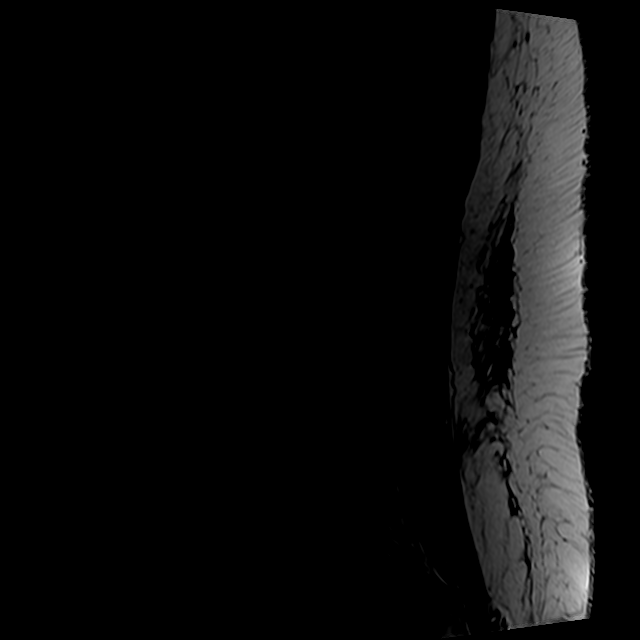

[Series 5: T2 · axial · 4.0mm · 0.78mm/px · z∈[-118,+112]mm · 9 of 41 slices shown (2 of 2)]
[im 1/41]
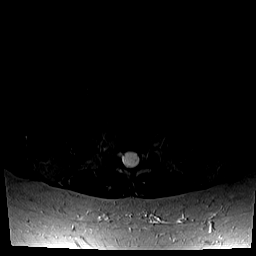
[im 6/41]
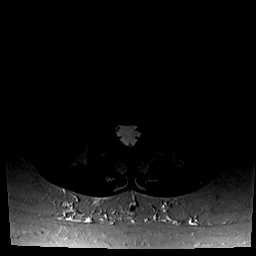
[im 12/41]
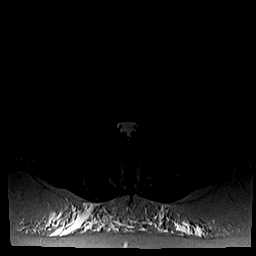
[im 18/41]
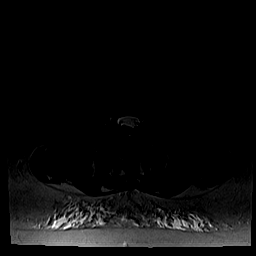
[im 21/41]
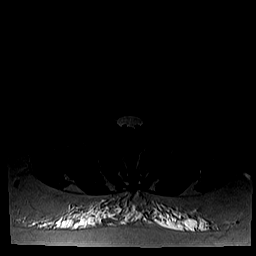
[im 23/41]
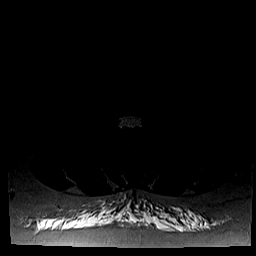
[im 29/41]
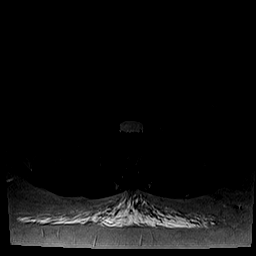
[im 35/41]
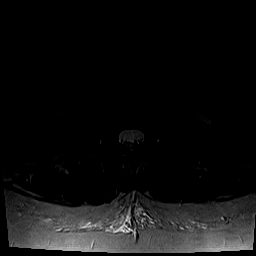
[im 41/41]
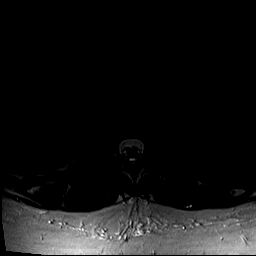

[Series 6: T1 · axial · 4.0mm · 0.39mm/px · z∈[-118,+82]mm · 5 of 41 slices shown (2 of 2)]
[im 1/41]
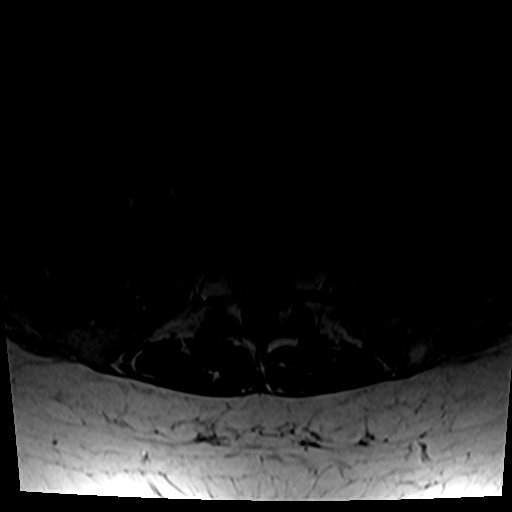
[im 6/41]
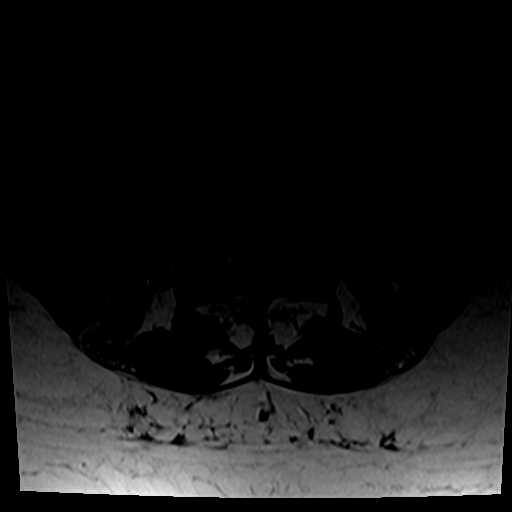
[im 12/41]
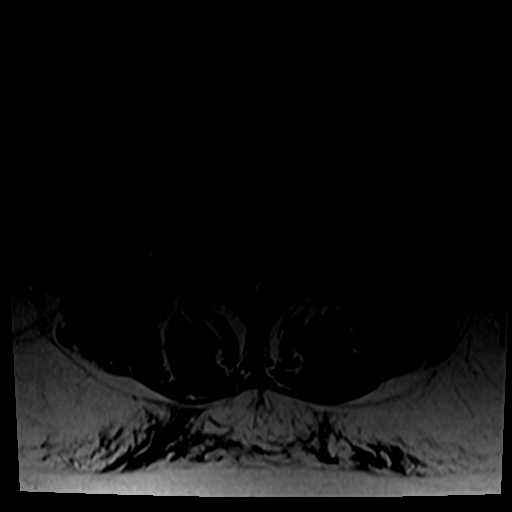
[im 21/41]
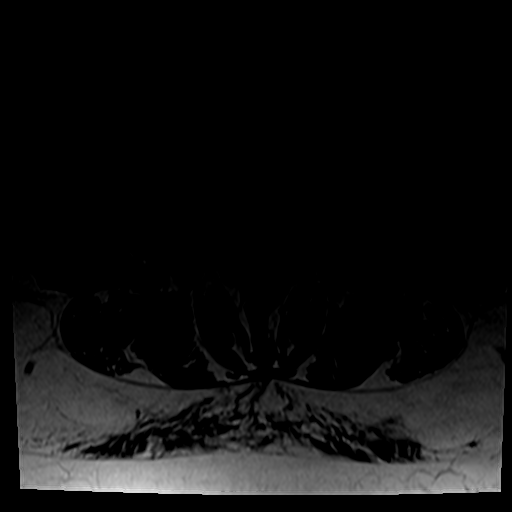
[im 35/41]
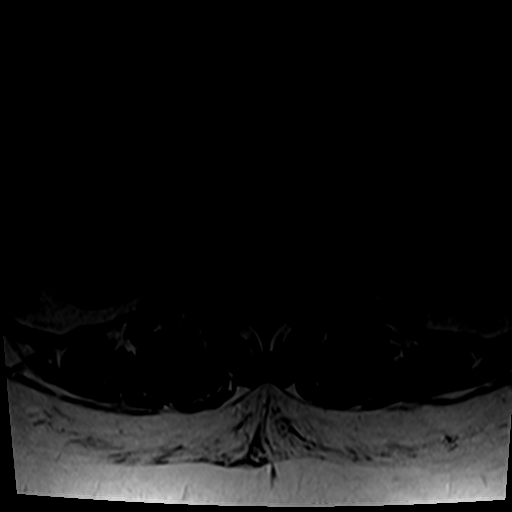

[26 of 48 positions shown; findings below may reference images not displayed]

FINDINGS: Segmentation:  5 lumbar type vertebral bodies.

Alignment:  Normal

Vertebrae:  Normal

Conus medullaris and cauda equina: Conus extends to the L1 level.
Conus and cauda equina appear normal.

Paraspinal and other soft tissues: Normal

Disc levels:

All disc levels are normal. No disc degeneration, bulge or
herniation. There is very minimal facet prominence at L4-5 and
L5-S1. No encroachment upon the neural spaces. This could possibly
be a cause of low back discomfort.
IMPRESSION: No disc pathology.  No canal or foraminal stenosis.

Very mild facet hypertrophy at L4-5 and L5-S1. Normally, I would
expect this to be subclinical or asymptomatic. However, there is a
possibility that it could be associated with low back

## 2019-07-17 ENCOUNTER — Emergency Department
Admission: EM | Admit: 2019-07-17 | Discharge: 2019-07-17 | Disposition: A | Discharge status: Home / Self Care | Payer: No Typology Code available for payment source | Source: Home / Self Care | Attending: Family Medicine | Admitting: Family Medicine

## 2019-07-17 ENCOUNTER — Other Ambulatory Visit: Payer: Self-pay

## 2019-07-17 DIAGNOSIS — Z8701 Personal history of pneumonia (recurrent): Secondary | ICD-10-CM

## 2019-07-17 DIAGNOSIS — J029 Acute pharyngitis, unspecified: Secondary | ICD-10-CM

## 2019-07-17 DIAGNOSIS — B370 Candidal stomatitis: Secondary | ICD-10-CM

## 2019-07-17 LAB — POCT CBC W AUTO DIFF (K'VILLE URGENT CARE)

## 2019-07-17 LAB — POCT RAPID STREP A (OFFICE): Rapid Strep A Screen: NEGATIVE

## 2019-07-17 MED ORDER — LIDOCAINE VISCOUS HCL 2 % MT SOLN: 15.0000 mL | Freq: Three times a day (TID) | OROMUCOSAL | 0 refills | Status: DC

## 2019-07-17 MED ORDER — HYDROCOD POLST-CPM POLST ER 10-8 MG/5ML PO SUER: ORAL | 0 refills | Status: DC

## 2019-07-17 MED ORDER — CLOTRIMAZOLE 10 MG MT TROC: 10.0000 mg | Freq: Every day | OROMUCOSAL | 0 refills | Status: DC

## 2019-07-17 NOTE — ED Provider Notes (Signed)
[facility]    CSN: [id number] Arrival date & time: 07/17/19  4098      History   Chief Complaint Chief Complaint  Patient presents with  . Sore Throat  . Cough  . Fever    HPI [name] [name] is a 34 y.o. female.   Patient developed cough, sore throat, cold sores, myalgias, and headache about five days ago.  She was seen in [facility] on 1/28 where chest x-ray showed possible peri-hilar pneumonia.  She was prescribed a Z-pak, cough syrup, and magic mouthwash.  She does not feel much better, complaining of persistent cough, fever to 100+, myalgias, fatigue, wheezing, and persistent sore thraot   The history is provided by the patient.    Past Medical History:  Diagnosis Date  . Anemia   . Anxiety   . DEPRESSION   . Family history of colon cancer 2019/06/14   Father, 67s; deceased.   Marland Kitchen GERD (gastroesophageal reflux disease)   . Hypothyroidism    Hx of, normalized TSH during pregnancy 2011  . Infertility, female   . Migraines   . Morbid obesity ([facility])    s/p RY 08/2012 - start weight 290 pounds  . Nephrolithiasis   . PCOS (polycystic ovarian syndrome)   . PONV (postoperative nausea and vomiting)   . Pregnancy induced hypertension     Patient Active Problem List   Diagnosis Date Noted  . Vitamin B12 deficiency 06/01/2019  . Vitamin D deficiency 06/01/2019  . Panic disorder 06/01/2019  . Family history of colon cancer Jun 14, 2019  . Chronic constipation 14-Jun-2019  . Major depression, recurrent, chronic ([facility]) 06-14-19  . Insomnia due to other mental disorder 03/13/2019  . Pain in both lower extremities 11/21/2018  . Nocturnal leg cramps 10/26/2018  . Nephrolithiasis   . Reduced libido, due to Lexapro 07/09/2017  . Binge eating disorder 04/23/2017  . Morbid obesity ([facility]) 04/23/2017  . Acquired iron deficiency anemia due to decreased absorption 02/22/2015  . 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 Quintella Reichert OSTEOTOMY Left 06/24/2017   Procedure: Ralene Bathe;  Surgeon: [name], DPM;  Location: [facility];  Service: Podiatry;  Laterality: Left;  . BUNIONECTOMY Left 06/24/2017   Procedure: Anthoney Harada;  Surgeon: [name], DPM;  Location: [facility];  Service: Podiatry;  Laterality: Left;  . CESAREAN SECTION     x 2  . CHOLECYSTECTOMY N/A 02/15/2019   Procedure: LAPAROSCOPIC CHOLECYSTECTOMY WITH INTRAOPERATIVE CHOLANGIOGRAM;  Surgeon: Andria Meuse, MD;  Location: [facility];  Service: General;  Laterality: N/A;  . FOOT SURGERY    . 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   Procedure: LAPAROSCOPIC ROUX-EN-Y GASTRIC;  Surgeon: Atilano Ina, MD;  Location: [facility];  Service: General;  Laterality: N/A;  laparoscopic roux-en-y gastric bypass  . LITHOTRIPSY Left   . TONSILLECTOMY    . TUBAL LIGATION    . UPPER GI ENDOSCOPY  08/24/2012   Procedure: UPPER GI ENDOSCOPY;  Surgeon: Atilano Ina, MD;  Location: [facility];  Service: General;;  . WISDOM TOOTH EXTRACTION      OB History    Gravida  2   Para  2   Term  2   Preterm  0   AB  0   Living  2     SAB  0   TAB  0   Ectopic  0   Multiple  0   Live Births  2            Home Medications    Prior to Admission medications   Medication Sig Start Date End Date Taking? Authorizing Provider  acetaminophen (TYLENOL) 500 MG tablet Take 2 tablets (1,000 mg total) by mouth every 6 (six) hours as needed for moderate pain. 02/13/19   Domenic Moras, PA-C  ALPRAZolam Duanne Moron) 1 MG tablet Take 1-2 tablets (1-2 mg total) by mouth at bedtime as needed for anxiety. 06/15/19   Leamon Arnt, MD  azithromycin (ZITHROMAX Z-PAK) 250 MG tablet Take 2 tablets (500 mg total) by mouth daily for 1 day, THEN 1 tablet (250 mg total) daily for 4 days. 07/14/19 07/19/19  Albrizze, Kaitlyn E, PA-C  busPIRone (BUSPAR) 7.5 MG tablet Take 1 tablet (7.5 mg total) by mouth 3  (three) times daily as needed. Patient not taking: Reported on 07/14/2019 06/01/19   Leamon Arnt, MD  CALCIUM-MAGNESIUM-ZINC PO Take 3 tablets by mouth daily.    [provider]  chlorpheniramine-HYDROcodone Amanda Cockayne PENNKINETIC ER) 10-8 MG/5ML SUER Take 16mL by mouth HS prn cough 07/17/19   Kandra Nicolas, MD  clotrimazole (MYCELEX) 10 MG troche Take 1 tablet (10 mg total) by mouth 5 (five) times daily. Dissolve slowly in mouth; do not chew 07/17/19   Kandra Nicolas, MD  cyanocobalamin (,VITAMIN B-12,) 1000 MCG/ML injection Inject 1 vial per week for 3 weeks, then 1 vial per month thereafter Patient taking differently: Inject 100 mcg into the skin once a week. Tuesdays 05/30/19   Leamon Arnt, MD  diclofenac Sodium (VOLTAREN) 1 % GEL Apply 4 g topically 4 (four) times daily. Patient not taking: Reported on 07/14/2019 05/13/19   Palumbo, April, MD  escitalopram (LEXAPRO) 20 MG tablet Take 1 tablet (20 mg total) by mouth daily. 03/11/19   Briscoe Deutscher, DO  ferrous sulfate 325 (65 FE) MG tablet Take 325 mg by mouth 2 (two) times daily with a meal.     [provider]  gabapentin (NEURONTIN) 300 MG capsule One tab PO qHS for a week, then BID for a week, then TID. May double weekly to a max of 3,600mg /day Patient taking differently: Take 300 mg by mouth 2 (two) times daily.  07/05/19   Gregor Hams, MD  guaiFENesin-codeine 100-10 MG/5ML syrup Take 5 mLs by mouth every 6 (six) hours as needed for cough. 07/15/19   Leamon Arnt, MD  HYDROcodone-acetaminophen (NORCO/VICODIN) 5-325 MG tablet Take 1 tablet by mouth every 6 (six) hours as needed. Patient not taking: Reported on 07/14/2019 07/11/19   Gregor Hams, MD  lamoTRIgine (LAMICTAL) 25 MG tablet Take 50 mg by mouth daily.    [provider]  LORazepam (ATIVAN) 0.5 MG tablet 1-2 tabs 30 - 60 min prior to MRI. Do not drive with this medicine. Patient not taking: Reported on 07/14/2019 07/12/19   Gregor Hams, MD   magic mouthwash w/lidocaine SOLN Take 5 mLs by mouth 3 (three) times daily as needed for up to 10 days for mouth pain. 07/15/19 07/25/19  Leamon Arnt, MD  Menaquinone-7 (VITAMIN K2 PO) Take 1 capsule by mouth daily.     [provider]  Multiple Vitamins-Iron (ONE-TABLET-DAILY/IRON PO) Take 1 tablet by mouth daily.    [provider]  ondansetron (ZOFRAN-ODT) 4 MG disintegrating tablet Take 1 tablet (4 mg total) by mouth every 6 (six) hours as needed for nausea. 02/16/19   Maczis, Barth Kirks,  PA-C  oxyCODONE-acetaminophen (PERCOCET/ROXICET) 5-325 MG tablet Take 1 tablet by mouth every 4 (four) hours as needed for severe pain. 07/14/19   Rodolph Bong, MD  pantoprazole (PROTONIX) 40 MG tablet Take 1 tablet (40 mg total) by mouth daily. 01/25/19   Esterwood, Amy S, PA-C  promethazine (PHENERGAN) 25 MG tablet Take 1 tablet (25 mg total) by mouth every 8 (eight) hours as needed for nausea or vomiting. 06/18/19   Worthy Rancher B, FNP  SYRINGE-NEEDLE, DISP, 3 ML 25G X 1" 3 ML MISC Use 1 needle per application once weekly 05/30/19   Willow Ora, MD  tizanidine (ZANAFLEX) 2 MG capsule Take 2 capsules (4 mg total) by mouth 3 (three) times daily. 07/12/19   Rodolph Bong, MD  valACYclovir (VALTREX) 1000 MG tablet Take 1 tablet (1,000 mg total) by mouth 3 (three) times daily. Patient taking differently: Take 1,000 mg by mouth 3 (three) times daily. AS NEEDED FOR OUTBREAKS 05/25/19   Daphine Deutscher, Mary-Margaret, FNP  Vitamin D, Ergocalciferol, (DRISDOL) 1.25 MG (50000 UT) CAPS capsule Take 50,000 Units by mouth once a week. Tuesday    [provider]    Family History Family History  Problem Relation Age of Onset  . Hyperlipidemia Father   . Hypertension Father   . Kidney disease Father   . Colon cancer Father 55  . Hypertension Mother   . Hypertension Maternal Grandmother   . Uterine cancer Maternal Grandmother 56  . Diabetes Maternal Grandfather     Social History Social History    Tobacco Use  . Smoking status: Never Smoker  . Smokeless tobacco: Never Used  Substance Use Topics  . Alcohol use: Yes    Comment: rare/socially  . Drug use: No     Allergies   Ambien [zolpidem] and Nsaids   Review of Systems Review of Systems + sore throat + cough No pleuritic pain + wheezing + nasal congestion ? post-nasal drainage No sinus pain/pressure No itchy/red eyes Nl earache No hemoptysis No SOB + fever, + chills No nausea No vomiting No abdominal pain No diarrhea No urinary symptoms No skin rash + fatigue + myalgias + headache    Physical Exam Triage Vital Signs ED Triage Vitals  Enc Vitals Group     BP 07/17/19 0944 130/89     Pulse Rate 07/17/19 0944 (!) 108     Resp 07/17/19 0944 18     Temp 07/17/19 0944 99.6 F (37.6 C)     Temp Source 07/17/19 0944 Oral     SpO2 07/17/19 0944 100 %     Weight 07/17/19 0945 260 lb (117.9 kg)     Height 07/17/19 0945 5\' 4"  (1.626 m)     Head Circumference --      Peak Flow --      Pain Score 07/17/19 0945 0     Pain Loc --      Pain Edu? --      Excl. in GC? --    No data found.  Updated Vital Signs BP 130/89 (BP Location: Right Arm)   Pulse (!) 108   Temp 99.6 F (37.6 C) (Oral)   Resp 18   Ht 5\' 4"  (1.626 m)   Wt 117.9 kg   SpO2 100%   BMI 44.63 kg/m   Visual Acuity Right Eye Distance:   Left Eye Distance:   Bilateral Distance:    Right Eye Near:   Left Eye Near:    Bilateral Near:  Physical Exam Nursing notes and Vital Signs reviewed. Appearance:  Patient appears stated age, and in no acute distress Eyes:  Pupils are equal, round, and reactive to light and accomodation.  Extraocular movement is intact.  Conjunctivae are not inflamed  Ears:  Canals normal.  Tympanic membranes normal.  Nose:  Mildly congested turbinates.  No sinus tenderness. Mouth/Pharynx:  Uvula and pharynx mildly erythematous.  Patchy whitish coating on tongue. Neck:  Supple.  Mildly enlarged lateral  nodes are present, tender to palpation bilaterally.   Lungs:  Clear to auscultation.  Breath sounds are equal.  Moving air well. Heart:  Regular rate and rhythm without murmurs, rubs, or gallops.  Abdomen:  Nontender without masses or hepatosplenomegaly.  Bowel sounds are present.  No CVA or flank tenderness.  Extremities:  No edema.  Skin:  No rash present.    UC Treatments / Results  Labs (all labs ordered are listed, but only abnormal results are displayed) Labs Reviewed  STREP A DNA PROBE  POCT RAPID STREP A (OFFICE) negative  POCT CBC W AUTO DIFF ([facility]):  WBC 9.9; LY 18.6; MO 3.8; GR 77.6; Hgb 11.9; Platelets 378   POCT KOH/wet prep mouth/pharynx:  Branching hyphae and fungal elements present  EKG   Radiology   Procedures Procedures (including critical care time)  Medications Ordered in UC Medications - No data to display  Initial Impression / Assessment and Plan / UC Course  I have reviewed the triage vital signs and the nursing notes.  Pertinent labs & imaging results that were available during my care of the patient were reviewed by me and considered in my medical decision making (see chart for details).    Note normal WBC today. Begin Mycelex troches. Rx for lidocaine viscous. Followup with Family Doctor in about 10 days.   Final Clinical Impressions(s) / UC Diagnoses   Final diagnoses:  Acute pharyngitis, unspecified etiology  Oral candidiasis  History of community acquired pneumonia     Discharge Instructions     Finish azithromycin. Take plain guaifenesin (1200mg  extended release tabs such as Mucinex) twice daily, with plenty of water, for cough and congestion.  Get adequate rest.   Try warm salt water gargles for sore throat.  Stop all antihistamines for now, and other non-prescription cough/cold preparations.  If symptoms become significantly worse during the night or over the weekend, proceed to the local emergency room.     ED  Prescriptions    Medication Sig Dispense Auth. Provider   chlorpheniramine-HYDROcodone (TUSSIONEX PENNKINETIC ER) 10-8 MG/5ML SUER Take 92mL by mouth HS prn cough 25 mL 4m, MD   clotrimazole (MYCELEX) 10 MG troche Take 1 tablet (10 mg total) by mouth 5 (five) times daily. Dissolve slowly in mouth; do not chew 35 Troche Lattie Haw, MD        Lattie Haw, MD 07/19/19 2012

## 2019-07-17 NOTE — ED Triage Notes (Addendum)
Pt c/o cough, sore throat and fever since 1/28. Was seen at [facility] ED on 1/28. Had both a rapid and PCR COVID, both neg. Also CXR, possible pnuemonia. Was rx'd a zpak, cough syrup and mouthwash. Not feeling much better since ED visit.

## 2019-07-17 NOTE — Discharge Instructions (Addendum)
Finish azithromycin. Take plain guaifenesin (1200mg  extended release tabs such as Mucinex) twice daily, with plenty of water, for cough and congestion.  Get adequate rest.   Try warm salt water gargles for sore throat.  Stop all antihistamines for now, and other non-prescription cough/cold preparations.  If symptoms become significantly worse during the night or over the weekend, proceed to the local emergency room.

## 2019-07-18 ENCOUNTER — Telehealth: Payer: Self-pay

## 2019-07-18 ENCOUNTER — Encounter: Payer: Self-pay | Admitting: Family Medicine

## 2019-07-18 LAB — STREP A DNA PROBE: Group A Strep Probe: NOT DETECTED

## 2019-07-18 NOTE — Progress Notes (Signed)
MRI lumbar spine looks pretty normal showing mild arthritis.  We will discuss this in further detail during visit scheduled tomorrow.

## 2019-07-18 NOTE — Telephone Encounter (Signed)
Please advise 

## 2019-07-18 NOTE — Telephone Encounter (Signed)
Please review

## 2019-07-18 NOTE — Telephone Encounter (Signed)
Patient is requesting to speak with Dr. Mardelle Matte  Or nurse regarding cough syrup that was prescribed to her. Patient states this is not working and she is not getting any sleep. Patient would like to know could she try something else.

## 2019-07-19 ENCOUNTER — Ambulatory Visit (INDEPENDENT_AMBULATORY_CARE_PROVIDER_SITE_OTHER): Payer: No Typology Code available for payment source | Admitting: Psychology

## 2019-07-19 ENCOUNTER — Ambulatory Visit: Payer: No Typology Code available for payment source | Admitting: Family Medicine

## 2019-07-19 ENCOUNTER — Ambulatory Visit: Payer: Self-pay

## 2019-07-19 ENCOUNTER — Other Ambulatory Visit: Payer: Self-pay

## 2019-07-19 ENCOUNTER — Encounter: Payer: Self-pay | Admitting: Family Medicine

## 2019-07-19 VITALS — BP 120/84 | HR 104 | Ht 64.0 in | Wt 268.4 lb

## 2019-07-19 DIAGNOSIS — F4322 Adjustment disorder with anxiety: Secondary | ICD-10-CM | POA: Diagnosis not present

## 2019-07-19 DIAGNOSIS — F5101 Primary insomnia: Secondary | ICD-10-CM | POA: Diagnosis not present

## 2019-07-19 DIAGNOSIS — M25552 Pain in left hip: Secondary | ICD-10-CM

## 2019-07-19 DIAGNOSIS — M7072 Other bursitis of hip, left hip: Secondary | ICD-10-CM | POA: Diagnosis not present

## 2019-07-19 DIAGNOSIS — G5702 Lesion of sciatic nerve, left lower limb: Secondary | ICD-10-CM | POA: Diagnosis not present

## 2019-07-19 MED ORDER — HYDROCODONE-ACETAMINOPHEN 5-325 MG PO TABS: 1.0000 | ORAL_TABLET | Freq: Four times a day (QID) | ORAL | 0 refills | Status: DC | PRN

## 2019-07-19 MED FILL — LIDOCAINE 2% VISCOUS SOLN: 2 | 3 days supply | Qty: 200 | Fill #0

## 2019-07-19 MED FILL — HYDROCODON-APAP 5-325: 5-325 | 4 days supply | Qty: 15 | Fill #0

## 2019-07-19 NOTE — Progress Notes (Signed)
I, Christoper Fabian, LAT, ATC, am serving as scribe for Dr. Clementeen Graham.  [name] [name] is a 34 y.o. female who presents to [facility] today for f/u of low back pain and L leg pain and numbness/tingling and L-spine MRI review.  She was last seen by Dr. Denyse Amass on 07/12/19 and an MRI of her L-spine was ordered that the pt had on 07/16/19.  At her last visit she reported increased pain in her L LE to her L calf and noted increased numbness/tingling into the L LE as well as L LE weakness.  She has been taking Gabapentin 300mg , Hydrocodone-acetaminophen and oxycodone-acetaminophen.  Since her last visit, pt reports that the prednisone did help her tingling and pain in her L LE.  She states that pain in her L leg continues to radiate to her L calf but is now intermittent in nature.  She con't to note weakness in her L leg as well as urinary urgency.  Additionally she was recently diagnosed and treated for pneumonia.  She still has a bit of a cough.   Pertinent review of systems: No fevers or chills.  Relevant historical information: History of gastric bypass and and B12 deficiency.   Exam:  BP 120/84 (BP Location: Left Arm, Patient Position: Sitting, Cuff Size: Large)   Pulse (!) 104   Ht 5\' 4"  (1.626 m)   Wt 268 lb 6.4 oz (121.7 kg)   SpO2 98%   BMI 46.07 kg/m  General: Well Developed, well nourished, and in no acute distress.   MSK:  Left hip normal-appearing normal motion. Tender palpation mildly greater trochanter. Mildly painful and tender overlying piriformis and ischial tuberosity region.  Pressure in this area does reproduce her lower leg numbness and tingling. Left knee: Normal-appearing normal motion nontender.  No reproduction of symptoms or pain with pressure overlying the posterior lateral knee. Normal foot and ankle motion and strength.  Pulses cap refill and sensation intact distally.    Lab and Radiology Results MR Lumbar Spine Wo  Contrast  Result Date: 07/16/2019 CLINICAL DATA:  Low back pain. Left posterior leg pain, weakness and numbness over the last 2 weeks. EXAM: MRI LUMBAR SPINE WITHOUT CONTRAST TECHNIQUE: Multiplanar, multisequence MR imaging of the lumbar spine was performed. No intravenous contrast was administered. COMPARISON:  CT 01/28/2019 FINDINGS: Segmentation:  5 lumbar type vertebral bodies. Alignment:  Normal Vertebrae:  Normal Conus medullaris and cauda equina: Conus extends to the L1 level. Conus and cauda equina appear normal. Paraspinal and other soft tissues: Normal Disc levels: All disc levels are normal. No disc degeneration, bulge or herniation. There is very minimal facet prominence at L4-5 and L5-S1. No encroachment upon the neural spaces. This could possibly be a cause of low back discomfort. IMPRESSION: No disc pathology.  No canal or foraminal stenosis. Very mild facet hypertrophy at L4-5 and L5-S1. Normally, I would expect this to be subclinical or asymptomatic. However, there is a possibility that it could be associated with low back Electronically Signed   By: 07/18/2019 M.D.   On: 07/16/2019 13:11   I, Paulina Fusi, personally (independently) visualized and performed the interpretation of the images attached in this note.   Limited musculoskeletal ultrasound left gluteus region reveals hypoechoic fluid tracking near the ischial tuberosity superficially to piriformis and sciatic nerve. No visible defect present. Impression: Ischial bursitis and piriformis syndrome.  Procedure: Real-time Ultrasound Guided Injection of left ischial bursitis Device: Philips Affiniti 50G Images permanently  stored and available for review in the ultrasound unit. Verbal informed consent obtained.  Discussed risks and benefits of procedure. Warned about infection bleeding damage to structures skin hypopigmentation and fat atrophy among others. Patient expresses understanding and agreement Time-out conducted.   Noted  no overlying erythema, induration, or other signs of local infection.   Skin prepped in a sterile fashion.   Local anesthesia: Topical Ethyl chloride.   With sterile technique and under real time ultrasound guidance:  40 mg of Depo-Medrol.  No lidocaine or Marcaine used. Injected easily.   Completed without difficulty   Advised to call if fevers/chills, erythema, induration, drainage, or persistent bleeding.   Images permanently stored and available for review in the ultrasound unit.  Impression: Technically successful ultrasound guided injection.         Assessment and Plan: 34 y.o. female with left lower leg pain. Lumbar spine MRI does not show significant changes consistent with sciatica. Detailed physical exam above buttocks and lower leg does indicate possible piriformis syndrome. Musculoskeletal ultrasound does show some hypoechoic fluid tracking in the ischial tuberosity there could attribute to her symptoms. Plan to proceed with home exercise program taught by ATC in clinic today targeting piriformis. Additionally proceed with steroid injection as above.  Did not use lidocaine or Marcaine as I was worried about potentially blocking the sciatic nerve.  If not improving would refer to formal physical therapy and proceed with nerve conduction study to further characterize source of lower leg pain. Could potentially also image C-spine and refer to neurology if still not clear.   PDMP reviewed during this encounter. Orders Placed This Encounter  Procedures  . [place] LIMITED JOINT SPACE STRUCTURES LOW LEFT(NO LINKED CHARGES)    Order Specific Question:   Reason for Exam (SYMPTOM  OR DIAGNOSIS REQUIRED)    Answer:   L hip pain    Order Specific Question:   Preferred imaging location?    Answer:   [place]   Meds ordered this encounter  Medications  . HYDROcodone-acetaminophen (NORCO/VICODIN) 5-325 MG tablet    Sig: Take 1 tablet by mouth every 6 (six)  hours as needed.    Dispense:  15 tablet    Refill:  0     Discussed warning signs or symptoms. Please see discharge instructions. Patient expresses understanding.   The above documentation has been reviewed and is accurate and complete Lynne Leader

## 2019-07-19 NOTE — Patient Instructions (Addendum)
Thank you for coming in today. You had an injection at your L ischial tuberosity/bursa.  Call or go to the ER if you develop a large red swollen joint with extreme pain or oozing puss.  I think the main issue is piriformis syndrome pressring on your sciatic nerve.  Work on home exercises.  Keep me updated.  Use hydrocodone sparingly.   Please perform the exercise program that we have prepared for you and gone over in detail on a daily basis.  In addition to the handout you were provided you can access your program through: [email or web address]   Your unique program code is:  [email or web address]

## 2019-07-22 ENCOUNTER — Ambulatory Visit: Payer: No Typology Code available for payment source | Admitting: Family Medicine

## 2019-07-24 ENCOUNTER — Other Ambulatory Visit: Payer: Self-pay | Admitting: Family Medicine

## 2019-07-26 MED ORDER — HYDROCODONE-ACETAMINOPHEN 5-325 MG PO TABS: 1.0000 | ORAL_TABLET | Freq: Four times a day (QID) | ORAL | 0 refills | Status: DC | PRN

## 2019-07-26 MED FILL — HYDROCODON-APAP 5-325: 5-325 | 4 days supply | Qty: 15 | Fill #0

## 2019-07-27 ENCOUNTER — Other Ambulatory Visit: Payer: Self-pay | Admitting: Family Medicine

## 2019-07-27 DIAGNOSIS — G4762 Sleep related leg cramps: Secondary | ICD-10-CM

## 2019-07-27 MED ORDER — TIZANIDINE HCL 2 MG PO CAPS: 4.0000 mg | ORAL_CAPSULE | Freq: Three times a day (TID) | ORAL | 1 refills | Status: DC

## 2019-07-27 NOTE — Telephone Encounter (Signed)
Received refill request from [facility] via fax.  Sent electronically.

## 2019-07-31 ENCOUNTER — Other Ambulatory Visit: Payer: Self-pay | Admitting: Family Medicine

## 2019-07-31 ENCOUNTER — Encounter: Payer: Self-pay | Admitting: Family Medicine

## 2019-08-01 ENCOUNTER — Other Ambulatory Visit: Payer: Self-pay | Admitting: Family Medicine

## 2019-08-01 MED ORDER — HYDROCODONE-ACETAMINOPHEN 5-325 MG PO TABS: 1.0000 | ORAL_TABLET | Freq: Four times a day (QID) | ORAL | 0 refills | Status: DC | PRN

## 2019-08-01 MED FILL — HYDROCODON-APAP 5-325: 5-325 | 4 days supply | Qty: 15 | Fill #0

## 2019-08-01 MED FILL — BUSPIRONE HCL 7.5 MG TABS: 7.5 | 20 days supply | Qty: 60 | Fill #0

## 2019-08-01 NOTE — Telephone Encounter (Signed)
FYI.  Pt will be coming in for f/u visit tomorrow.

## 2019-08-02 ENCOUNTER — Ambulatory Visit: Payer: Self-pay

## 2019-08-02 ENCOUNTER — Other Ambulatory Visit: Payer: Self-pay

## 2019-08-02 ENCOUNTER — Ambulatory Visit: Payer: No Typology Code available for payment source | Admitting: Family Medicine

## 2019-08-02 ENCOUNTER — Encounter: Payer: Self-pay | Admitting: Family Medicine

## 2019-08-02 VITALS — BP 120/80 | HR 80 | Ht 64.0 in | Wt 263.8 lb

## 2019-08-02 DIAGNOSIS — M5442 Lumbago with sciatica, left side: Secondary | ICD-10-CM | POA: Diagnosis not present

## 2019-08-02 DIAGNOSIS — G8929 Other chronic pain: Secondary | ICD-10-CM | POA: Diagnosis not present

## 2019-08-02 DIAGNOSIS — M533 Sacrococcygeal disorders, not elsewhere classified: Secondary | ICD-10-CM

## 2019-08-02 MED FILL — tiZANidine HCL 2 MG TABS: 2 | 15 days supply | Qty: 90 | Fill #0

## 2019-08-02 NOTE — Patient Instructions (Signed)
Thank you for coming in today. Call or go to the ER if you develop a large red swollen joint with extreme pain or oozing puss.  Attend PT when able.  Recheck in 6 weeks.  Try to limit hydrocodone to 2 pills a day.  Keep me updated.   I will refill as needed.

## 2019-08-02 NOTE — Progress Notes (Signed)
I, [name], LAT, ATC, am serving as scribe for Dr. Lynne Leader.  [name] [name] is a 34 y.o. female who presents to [place] [facility] today for f/u of low back pain and L leg pain to the calf.  She also reports numbness and tingling in the L LE and weakness.  She last saw Dr. Georgina Snell on 07/19/19 and had a L ischial tuberosity injection.  She is taking prednisone, Gabapentin and Hydrocodone-acetaminophen.  She has been provided a HEP.  Since her last visit, she reports that she initially improved but has most recently worsened with increased numbness/tingling noted in her L LE  and she is now having increased low back muscular spasms.  She is also now having L-sided neck pain that is radiating into her L scapula.  She states that the IT injection at her last visit dramatically improved her L leg pain and the numbness/tingling.  She is using approximately 4 hydrocodone pills per day.  Diagnostic testing: L-spine CT- 01/28/19; L-spine MRI- 07/16/19   Pertinent review of systems: No fevers or chills no new weakness or numbness lower extremity.  Relevant historical information: History of depression.  History job change.   Exam:  BP 120/80 (BP Location: Left Arm, Patient Position: Sitting, Cuff Size: Large)   Pulse 80   Ht 5\' 4"  (1.626 m)   Wt 263 lb 12.8 oz (119.7 kg)   SpO2 98%   BMI 45.28 kg/m  General: Well Developed, well nourished, and in no acute distress.   MSK:  L-spine: Nontender to midline.  Tender palpation left SI joint. Lower extremity strength reflexes sensation intact bilaterally. Normal gait.    Lab and Radiology Results  Procedure: Real-time Ultrasound Guided Injection of left SI joint Device: Philips Affiniti 50G Images permanently stored and available for review in the ultrasound unit. Verbal informed consent obtained.  Discussed risks and benefits of procedure. Warned about infection bleeding damage to structures skin  hypopigmentation and fat atrophy among others. Patient expresses understanding and agreement Time-out conducted.   Noted no overlying erythema, induration, or other signs of local infection.   Skin prepped in a sterile fashion.   Local anesthesia: Topical Ethyl chloride.   With sterile technique and under real time ultrasound guidance:  40 mg of Kenalog and 3 mL of Marcaine injected easily.   Completed without difficulty   Pain immediately resolved suggesting accurate placement of the medication.   Advised to call if fevers/chills, erythema, induration, drainage, or persistent bleeding.   Images permanently stored and available for review in the ultrasound unit.  Impression: Technically successful ultrasound guided injection.         Assessment and Plan: 33 y.o. female with  Continued back and leg pain.  The radicular radiating pain down the leg has improved quite a bit after ischial tuberosity injection after last visit.  However she developed pretty considerable back pain.  Pain is bad enough that is requiring multiple hydrocodone per day.  Plan for SI joint injection as above.  Also emphasize physical therapy.  She is waiting on her new health insurance to start in March to schedule physical therapy.  Additionally discussed pain medicine needs.  She is effectively taking quite a bit of hydrocodone.  Plan to try backing down to 2 pills/day.  If not able would have to refer to pain management.   PDMP not reviewed this encounter. Orders Placed This Encounter  Procedures  . [place] LIMITED JOINT SPACE STRUCTURES LOW LEFT(NO  LINKED CHARGES)    Order Specific Question:   Reason for Exam (SYMPTOM  OR DIAGNOSIS REQUIRED)    Answer:   SI joint pain    Order Specific Question:   Preferred imaging location?    Answer:   [facility] Question:   Release to patient    Answer:   Immediate   No orders of the defined types were placed in this  encounter.    Discussed warning signs or symptoms. Please see discharge instructions. Patient expresses understanding.   The above documentation has been reviewed and is accurate and complete Clementeen Graham

## 2019-08-04 ENCOUNTER — Other Ambulatory Visit: Payer: Self-pay | Admitting: Family Medicine

## 2019-08-05 MED ORDER — HYDROCODONE-ACETAMINOPHEN 5-325 MG PO TABS: 1.0000 | ORAL_TABLET | Freq: Four times a day (QID) | ORAL | 0 refills | Status: DC | PRN

## 2019-08-05 MED FILL — HYDROCODON-APAP 5-325: 5-325 | 3 days supply | Qty: 15 | Fill #0

## 2019-08-08 MED FILL — ALPRAZolam 1 MG TABS: 1 | 30 days supply | Qty: 60 | Fill #2

## 2019-08-09 ENCOUNTER — Ambulatory Visit: Payer: No Typology Code available for payment source | Admitting: Psychology

## 2019-08-09 ENCOUNTER — Encounter: Payer: No Typology Code available for payment source | Admitting: Family Medicine

## 2019-08-10 ENCOUNTER — Encounter: Payer: Self-pay | Admitting: Family Medicine

## 2019-08-11 ENCOUNTER — Ambulatory Visit (INDEPENDENT_AMBULATORY_CARE_PROVIDER_SITE_OTHER): Payer: No Typology Code available for payment source | Admitting: Family Medicine

## 2019-08-11 ENCOUNTER — Encounter: Payer: Self-pay | Admitting: Family Medicine

## 2019-08-11 ENCOUNTER — Telehealth: Payer: Self-pay | Admitting: Family Medicine

## 2019-08-11 ENCOUNTER — Other Ambulatory Visit: Payer: Self-pay | Admitting: Family Medicine

## 2019-08-11 ENCOUNTER — Ambulatory Visit: Payer: No Typology Code available for payment source | Admitting: Family Medicine

## 2019-08-11 DIAGNOSIS — M7072 Other bursitis of hip, left hip: Secondary | ICD-10-CM | POA: Diagnosis not present

## 2019-08-11 DIAGNOSIS — E538 Deficiency of other specified B group vitamins: Secondary | ICD-10-CM | POA: Diagnosis not present

## 2019-08-11 DIAGNOSIS — F410 Panic disorder [episodic paroxysmal anxiety] without agoraphobia: Secondary | ICD-10-CM | POA: Diagnosis not present

## 2019-08-11 DIAGNOSIS — F339 Major depressive disorder, recurrent, unspecified: Secondary | ICD-10-CM

## 2019-08-11 MED ORDER — HYDROCODONE-ACETAMINOPHEN 5-325 MG PO TABS: 1.0000 | ORAL_TABLET | Freq: Four times a day (QID) | ORAL | 0 refills | Status: DC | PRN

## 2019-08-11 MED ORDER — HYDROXYZINE HCL 25 MG PO TABS: 25.0000 mg | ORAL_TABLET | Freq: Every evening | ORAL | 2 refills | Status: DC | PRN

## 2019-08-11 MED ORDER — CLONAZEPAM 1 MG PO TABS: 0.5000 mg | ORAL_TABLET | Freq: Two times a day (BID) | ORAL | 1 refills | Status: DC | PRN

## 2019-08-11 MED FILL — clonazePAM 1 MG TABS: 1 | 30 days supply | Qty: 60 | Fill #0

## 2019-08-11 MED FILL — hydrOXYzine HCL 25 MG TABS: 25 | 30 days supply | Qty: 60 | Fill #0

## 2019-08-11 NOTE — Telephone Encounter (Signed)
Please advise 

## 2019-08-11 NOTE — Progress Notes (Signed)
Virtual Visit via Video Note  SUBJECTIVE CC:  Chief Complaint  Patient presents with  . Anxiety    increased last night. trouble sleeping. seeing insomnia therapist.     I connected with [name] on 08/11/19 at 11:20 AM EST by a video enabled telemedicine application and verified that I am speaking with the correct person using two identifiers. Location patient: Home Location provider: [facility] Persons participating in the virtual visit: [name] [name], [name] Ora, MD [name] [name], CMA   I discussed the limitations of evaluation and management by telemedicine and the availability of in person appointments. The patient expressed understanding and agreed to proceed.  HPI: [name] [name] is a 34 y.o. female who was contacted today to address the problems listed above in the chief complaint/mood.  [name] lost her job earlier this month. Since, anxiety is skyrocketing. Can't sleep at all (this had been improving with sleep therapist). Having almost daily panic attacks, worse in social situations. Decreased motivation.   Psych history is complicated: reports dxd with ADD, depression, panic and insomnia. Using xanax for sleep. On lexparo and lamictal recently added: no dx of bipolar but thinks dad had it. Had been on ADD meds but stopped for unclear reasons. Previous PCP dxd with binge eating disorder as well. Never has seen psychiatrist. Has been to acute care clinic/behavioral health in past due to feeling out of control.   Leg and back pain: finally improving;had steroid injection for bursits. Also feels like b12 injections are now helping.  Depression screen [facility] 2/9 07/14/2019 07/12/2019 03/11/2019  Decreased Interest 2 2 1   Down, Depressed, Hopeless 2 1 1   PHQ - 2 Score 4 3 2   Altered sleeping 2 3 3   Tired, decreased energy 2 1 1   Change in appetite 1 1 2   Feeling bad or failure about yourself  0 0 1  Trouble concentrating 2 3 0    Moving slowly or fidgety/restless 0 0 0  Suicidal thoughts 0 0 0  PHQ-9 Score 11 11 9   Difficult doing work/chores Somewhat difficult Extremely dIfficult Somewhat difficult  Some recent data might be hidden   GAD 7 : Generalized Anxiety Score 06/01/2018 06/08/2017  Nervous, Anxious, on Edge 1 1  Control/stop worrying 2 1  Worry too much - different things 2 2  Trouble relaxing 2 1  Restless 2 1  Easily annoyed or irritable 2 1  Afraid - awful might happen 3 0  Total GAD 7 Score 14 7     ASSESSMENT 1. Panic disorder   2. Major depression, recurrent, chronic (HCC)   3. Vitamin B12 deficiency   4. Ischial bursitis of left side      Mood disorder:  Request psychiatrist eval to better clarify mood disorder.? GAD or bipolar. Then can work with best medications. For now, change from xanax to klonopin and hydroxyzine. Continue buspar and lexapro and lamictal. Sleep hygiene discussed.   F/u with sports med. Consider side effects from steroids as well affecting mood.   Continue b12 injections monthly.  I discussed the assessment and treatment plan with the patient. The patient was provided an opportunity to ask questions and all were answered. The patient agreed with the plan and demonstrated an understanding of the instructions.   The patient was advised to call back or seek an in-person evaluation if the symptoms worsen or if the condition fails to improve as anticipated. Follow up: f/u as  scheduled.   09/06/2019  Meds ordered this encounter  Medications  . clonazePAM (KLONOPIN) 1 MG tablet    Sig: Take 0.5-1 tablets (0.5-1 mg total) by mouth 2 (two) times daily as needed for anxiety.    Dispense:  60 tablet    Refill:  1  . hydrOXYzine (ATARAX/VISTARIL) 25 MG tablet    Sig: Take 1-2 tablets (25-50 mg total) by mouth at bedtime as needed for anxiety (sleep).    Dispense:  60 tablet    Refill:  2      I reviewed the patients updated PMH, FH, and SocHx.    Patient Active  Problem List   Diagnosis Date Noted  . Panic disorder 06/01/2019    Priority: High  . Family history of colon cancer 05/16/2019    Priority: High  . Major depression, recurrent, chronic (HCC) 05/16/2019    Priority: High  . Insomnia due to other mental disorder 03/13/2019    Priority: High  . Morbid obesity (HCC) 04/23/2017    Priority: High  . Acquired iron deficiency anemia due to decreased absorption 02/22/2015    Priority: High  . 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    Priority: High  . Vitamin B12 deficiency 06/01/2019    Priority: Medium  . Chronic constipation 05/16/2019    Priority: Medium  . Pain in both lower extremities 11/21/2018    Priority: Medium  . Nocturnal leg cramps 10/26/2018    Priority: Medium  . Nephrolithiasis     Priority: Medium  . Binge eating disorder 04/23/2017    Priority: Medium  . Vitamin D deficiency 06/01/2019    Priority: Low  . Reduced libido, due to Lexapro 07/09/2017    Priority: Low  . Ischial bursitis of left side 07/19/2019   Current Meds  Medication Sig  . acetaminophen (TYLENOL) 500 MG tablet Take 2 tablets (1,000 mg total) by mouth every 6 (six) hours as needed for moderate pain.  . busPIRone (BUSPAR) 7.5 MG tablet TAKE 1 TABLET (7.5 MG TOTAL) BY MOUTH 3 (THREE) TIMES DAILY AS NEEDED.  Marland Kitchen CALCIUM-MAGNESIUM-ZINC PO Take 3 tablets by mouth daily.  . cyanocobalamin (,VITAMIN B-12,) 1000 MCG/ML injection Inject 1 vial per week for 3 weeks, then 1 vial per month thereafter (Patient taking differently: Inject 100 mcg into the skin once a week. Tuesdays)  . diclofenac Sodium (VOLTAREN) 1 % GEL Apply 4 g topically 4 (four) times daily.  Marland Kitchen escitalopram (LEXAPRO) 20 MG tablet Take 1 tablet (20 mg total) by mouth daily.  . ferrous sulfate 325 (65 FE) MG tablet Take 325 mg by mouth 2 (two) times daily with a meal.   . gabapentin (NEURONTIN) 300 MG capsule One tab PO qHS for a week, then BID for a week, then TID. May double weekly to  a max of 3,600mg /day (Patient taking differently: Take 300 mg by mouth 2 (two) times daily. )  . HYDROcodone-acetaminophen (NORCO/VICODIN) 5-325 MG tablet Take 1 tablet by mouth every 6 (six) hours as needed.  . lamoTRIgine (LAMICTAL) 25 MG tablet Take 50 mg by mouth daily.  . Menaquinone-7 (VITAMIN K2 PO) Take 1 capsule by mouth daily.   . Multiple Vitamins-Iron (ONE-TABLET-DAILY/IRON PO) Take 1 tablet by mouth daily.  . ondansetron (ZOFRAN-ODT) 4 MG disintegrating tablet Take 1 tablet (4 mg total) by mouth every 6 (six) hours as needed for nausea.  . pantoprazole (PROTONIX) 40 MG tablet Take 1 tablet (40 mg total) by mouth daily.  . SYRINGE-NEEDLE, DISP, 3 ML  25G X 1" 3 ML MISC Use 1 needle per application once weekly  . tizanidine (ZANAFLEX) 2 MG capsule Take 2 capsules (4 mg total) by mouth 3 (three) times daily.  . valACYclovir (VALTREX) 1000 MG tablet Take 1 tablet (1,000 mg total) by mouth 3 (three) times daily. (Patient taking differently: Take 1,000 mg by mouth 3 (three) times daily. AS NEEDED FOR OUTBREAKS)  . Vitamin D, Ergocalciferol, (DRISDOL) 1.25 MG (50000 UT) CAPS capsule Take 50,000 Units by mouth once a week. Tuesday  . [DISCONTINUED] ALPRAZolam (XANAX) 1 MG tablet Take 1-2 tablets (1-2 mg total) by mouth at bedtime as needed for anxiety.    Allergies: Patient is allergic to ambien [zolpidem] and nsaids. Family History: Patient family history includes Colon cancer (age of onset: 70) in her father; Diabetes in her maternal grandfather; Hyperlipidemia in her father; Hypertension in her father, maternal grandmother, and mother; Kidney disease in her father; Uterine cancer (age of onset: 75) in her maternal grandmother. Social History:  Patient  reports that she has never smoked. She has never used smokeless tobacco. She reports current alcohol use. She reports that she does not use drugs.  Review of Systems: Constitutional: Negative for fever malaise or anorexia Cardiovascular:  negative for chest pain Respiratory: negative for SOB or persistent cough Gastrointestinal: negative for abdominal pain  OBJECTIVE/OBSERVATIONS: General: no acute distress, well appearing, no apparent distress, well groomed Psych:  Alert and oriented x 3,normal mood, behavior, speech, dress, and thought processes.   Leamon Arnt, MD

## 2019-08-11 NOTE — Telephone Encounter (Signed)
Patient called in and stated she was having stress and anxiety and wanted to see if she could be worked in today. I put her in for 08/12/19 at 1pm for an appt. Patient still wanted me to send a message to see if she could get worked in for today.

## 2019-08-11 NOTE — Telephone Encounter (Signed)
Is she willing to see Dr. Artis Flock?

## 2019-08-11 NOTE — Patient Instructions (Signed)
Please return as scheduled in march 23rd.   If you have any questions or concerns, please don't hesitate to send me a message via MyChart or call the office at [phone]. Thank you for visiting with [place] today! It's our pleasure caring for you.  Psychiatrists  [facility]  [address] [place], [place] [address] [phone]  [facility] Corie Chiquito, NP [address] [place], [place] [address] [place], [place] [address] [phone]  [facility] Deatra Robinson, NP Verlon Au NP [address] [place], [place] [address] [phone]  Counseling centers only:  [facility] [address] [place], [place] [phone]  [facility]: [facility] [address] [place] [address] [place], [place] [phone]   [facility] [address] [place], [place] [address] [place], [place] [address] [phone]  [facility] [address] [place], [place] [address] [phone]

## 2019-08-11 NOTE — Telephone Encounter (Signed)
See below

## 2019-08-11 NOTE — Telephone Encounter (Signed)
Scheduled with Dr.Wolfe today at 60

## 2019-08-12 ENCOUNTER — Ambulatory Visit: Payer: No Typology Code available for payment source | Admitting: Family Medicine

## 2019-08-15 ENCOUNTER — Other Ambulatory Visit: Payer: Self-pay | Admitting: Family Medicine

## 2019-08-15 MED ORDER — HYDROCODONE-ACETAMINOPHEN 5-325 MG PO TABS: 1.0000 | ORAL_TABLET | Freq: Four times a day (QID) | ORAL | 0 refills | Status: DC | PRN

## 2019-08-15 NOTE — Telephone Encounter (Signed)
Please advise 

## 2019-08-17 MED FILL — tiZANidine HCL 2 MG TABS: 2 | 15 days supply | Qty: 90 | Fill #1

## 2019-08-18 ENCOUNTER — Other Ambulatory Visit: Payer: Self-pay | Admitting: Family Medicine

## 2019-08-18 MED ORDER — HYDROCODONE-ACETAMINOPHEN 5-325 MG PO TABS: 1.0000 | ORAL_TABLET | Freq: Four times a day (QID) | ORAL | 0 refills | Status: DC | PRN

## 2019-08-22 ENCOUNTER — Other Ambulatory Visit: Payer: Self-pay | Admitting: Family Medicine

## 2019-08-22 ENCOUNTER — Encounter: Payer: Self-pay | Admitting: Family Medicine

## 2019-08-23 ENCOUNTER — Ambulatory Visit (INDEPENDENT_AMBULATORY_CARE_PROVIDER_SITE_OTHER): Payer: BC Managed Care – PPO | Admitting: Psychology

## 2019-08-23 DIAGNOSIS — F1810 Inhalant abuse, uncomplicated: Secondary | ICD-10-CM | POA: Diagnosis not present

## 2019-08-23 DIAGNOSIS — F3181 Bipolar II disorder: Secondary | ICD-10-CM | POA: Diagnosis not present

## 2019-08-23 DIAGNOSIS — F4322 Adjustment disorder with anxiety: Secondary | ICD-10-CM | POA: Diagnosis not present

## 2019-08-23 DIAGNOSIS — F5101 Primary insomnia: Secondary | ICD-10-CM

## 2019-08-23 NOTE — Telephone Encounter (Signed)
Please advise 

## 2019-08-24 MED ORDER — HYDROCODONE-ACETAMINOPHEN 5-325 MG PO TABS: 1.0000 | ORAL_TABLET | Freq: Four times a day (QID) | ORAL | 0 refills | Status: DC | PRN

## 2019-08-29 ENCOUNTER — Other Ambulatory Visit: Payer: Self-pay | Admitting: Family Medicine

## 2019-08-29 MED ORDER — HYDROCODONE-ACETAMINOPHEN 5-325 MG PO TABS: 1.0000 | ORAL_TABLET | Freq: Four times a day (QID) | ORAL | 0 refills | Status: DC | PRN

## 2019-09-02 ENCOUNTER — Other Ambulatory Visit: Payer: Self-pay | Admitting: Family Medicine

## 2019-09-02 ENCOUNTER — Encounter: Payer: Self-pay | Admitting: Family Medicine

## 2019-09-02 ENCOUNTER — Telehealth: Payer: Self-pay | Admitting: Family Medicine

## 2019-09-02 NOTE — Telephone Encounter (Signed)
Called and left another message today advising patient that she needs to contact my office to get a new appointment.  She has been using quite a bit of pain medication and we need to come to terms with that.  Medication refill request will rebe rejected until phone call made back. 

## 2019-09-02 NOTE — Telephone Encounter (Signed)
Called and left another message today advising patient that she needs to contact my office to get a new appointment.  She has been using quite a bit of pain medication and we need to come to terms with that.  Medication refill request will rebe rejected until phone call made back.

## 2019-09-02 NOTE — Telephone Encounter (Signed)
[name] [name] is scheduled to see me on the 30th however I will not be here.  She has been using a lot of pain medication and we need to discuss this.  Patient should reschedule.

## 2019-09-02 NOTE — Telephone Encounter (Signed)
Patient responded through mychart.

## 2019-09-02 NOTE — Telephone Encounter (Signed)
Please advise.  This came in after the note you sent me about re-scheduling her appt for 09/13/19 so let me know status of this refill and then I will call pt and advise.

## 2019-09-06 ENCOUNTER — Ambulatory Visit: Payer: BC Managed Care – PPO | Admitting: Family Medicine

## 2019-09-06 ENCOUNTER — Other Ambulatory Visit: Payer: Self-pay

## 2019-09-06 ENCOUNTER — Encounter: Payer: Self-pay | Admitting: Family Medicine

## 2019-09-06 VITALS — BP 124/78 | HR 105 | Temp 97.8°F | Ht 64.0 in | Wt 255.6 lb

## 2019-09-06 DIAGNOSIS — M79661 Pain in right lower leg: Secondary | ICD-10-CM

## 2019-09-06 DIAGNOSIS — F3181 Bipolar II disorder: Secondary | ICD-10-CM

## 2019-09-06 DIAGNOSIS — M79662 Pain in left lower leg: Secondary | ICD-10-CM

## 2019-09-06 DIAGNOSIS — F410 Panic disorder [episodic paroxysmal anxiety] without agoraphobia: Secondary | ICD-10-CM | POA: Diagnosis not present

## 2019-09-06 NOTE — Progress Notes (Signed)
Subjective  CC:  Chief Complaint  Patient presents with  . Depression    managing with new meds and counseling  . Manic Behavior    Dx with panic disorder. counseling helps. started lamictal, buspirone, hydroxyzine, lexapro and seroquel    HPI: [name] [name] is a 34 y.o. female who presents to the office today to address the problems listed above in the chief complaint.  Mood disorder. F/u from last visit: recommend psych for further clarification: dxd with bipolar 2 disorder and panic d/o. Now onmultiple medications and feeling much better.   Obesity: discussed difficulties with weight loss. Pt with long stressful year; new mood dx. On meds with risk of weight gain. Recommend weight maintenance; will work on weight mgt once stable.   Leg pain: thorough workup w/o evident etiology. Still with anterior shin pain intermittently.   Assessment  1. Bipolar 2 disorder (HCC)   2. Panic disorder   3. Morbid obesity (HCC)   4. Pain in both lower legs      Plan   Bipolar d/o and panic:  Improved but multiple meds. Will monitor weight, glucose, and thyroid  Obesity; discussed how to avoid further weight gain  Pain: no change but no dx made. Monitor. ?fibromyalgia  Follow up: No follow-ups on file.  Visit date not found  No orders of the defined types were placed in this encounter.  No orders of the defined types were placed in this encounter.     I reviewed the patients updated PMH, FH, and SocHx.    Patient Active Problem List   Diagnosis Date Noted  . Panic disorder 06/01/2019    Priority: High  . Family history of colon cancer 05/16/2019    Priority: High  . Major depression, recurrent, chronic (HCC) 05/16/2019    Priority: High  . Insomnia due to other mental disorder 03/13/2019    Priority: High  . Morbid obesity (HCC) 04/23/2017    Priority: High  . Acquired iron deficiency anemia due to decreased absorption 02/22/2015    Priority: High  . History of  Roux-en-Y gastric bypass 04/07/2013    Priority: High  . Vitamin B12 deficiency 06/01/2019    Priority: Medium  . Chronic constipation 05/16/2019    Priority: Medium  . Pain in both lower extremities 11/21/2018    Priority: Medium  . Nocturnal leg cramps 10/26/2018    Priority: Medium  . Nephrolithiasis     Priority: Medium  . Binge eating disorder 04/23/2017    Priority: Medium  . Vitamin D deficiency 06/01/2019    Priority: Low  . Reduced libido, due to Lexapro 07/09/2017    Priority: Low  . Ischial bursitis of left side 07/19/2019   Current Meds  Medication Sig  . acetaminophen (TYLENOL) 500 MG tablet Take 2 tablets (1,000 mg total) by mouth every 6 (six) hours as needed for moderate pain.  . busPIRone (BUSPAR) 7.5 MG tablet Take 7.5 mg by mouth 3 (three) times daily.   Marland Kitchen CALCIUM-MAGNESIUM-ZINC PO Take 3 tablets by mouth daily.  . clonazePAM (KLONOPIN) 1 MG tablet Take 0.5-1 tablets (0.5-1 mg total) by mouth 2 (two) times daily as needed for anxiety.  . cyanocobalamin (,VITAMIN B-12,) 1000 MCG/ML injection Inject 1 vial per week for 3 weeks, then 1 vial per month thereafter (Patient taking differently: Inject 100 mcg into the skin once a week. Tuesdays)  . diclofenac Sodium (VOLTAREN) 1 % GEL Apply 4 g topically 4 (four) times daily.  Marland Kitchen escitalopram (  LEXAPRO) 10 MG tablet Take 10 mg by mouth daily.  . ferrous sulfate 325 (65 FE) MG tablet Take 325 mg by mouth 2 (two) times daily with a meal.   . gabapentin (NEURONTIN) 300 MG capsule One tab PO qHS for a week, then BID for a week, then TID. May double weekly to a max of 3,600mg /day (Patient taking differently: Take 300 mg by mouth 2 (two) times daily. )  . HYDROcodone-acetaminophen (NORCO/VICODIN) 5-325 MG tablet Take 1 tablet by mouth every 6 (six) hours as needed.  . hydrOXYzine (VISTARIL) 50 MG capsule Take 50 mg by mouth every 6 (six) hours as needed.  . lamoTRIgine (LAMICTAL) 100 MG tablet Take 100 mg by mouth daily.  .  Menaquinone-7 (VITAMIN K2 PO) Take 1 capsule by mouth daily.   . Multiple Vitamins-Iron (ONE-TABLET-DAILY/IRON PO) Take 1 tablet by mouth daily.  . ondansetron (ZOFRAN-ODT) 4 MG disintegrating tablet Take 1 tablet (4 mg total) by mouth every 6 (six) hours as needed for nausea.  . pantoprazole (PROTONIX) 40 MG tablet Take 1 tablet (40 mg total) by mouth daily.  . QUEtiapine (SEROQUEL) 300 MG tablet Take 300 mg by mouth at bedtime.   . SYRINGE-NEEDLE, DISP, 3 ML 25G X 1" 3 ML MISC Use 1 needle per application once weekly  . tizanidine (ZANAFLEX) 2 MG capsule Take 2 capsules (4 mg total) by mouth 3 (three) times daily.  . valACYclovir (VALTREX) 1000 MG tablet Take 1 tablet (1,000 mg total) by mouth 3 (three) times daily. (Patient taking differently: Take 1,000 mg by mouth 3 (three) times daily. AS NEEDED FOR OUTBREAKS)  . Vitamin D, Ergocalciferol, (DRISDOL) 1.25 MG (50000 UT) CAPS capsule Take 50,000 Units by mouth once a week. Tuesday    Allergies: Patient is allergic to ambien [zolpidem] and nsaids. Family History: Patient family history includes Colon cancer (age of onset: 31) in her father; Diabetes in her maternal grandfather; Hyperlipidemia in her father; Hypertension in her father, maternal grandmother, and mother; Kidney disease in her father; Uterine cancer (age of onset: 35) in her maternal grandmother. Social History:  Patient  reports that she has never smoked. She has never used smokeless tobacco. She reports current alcohol use. She reports that she does not use drugs.  Review of Systems: Constitutional: Negative for fever malaise or anorexia Cardiovascular: negative for chest pain Respiratory: negative for SOB or persistent cough Gastrointestinal: negative for abdominal pain  Objective  Vitals: BP 124/78 (BP Location: Right Arm, Patient Position: Sitting, Cuff Size: Large)   Pulse (!) 105   Temp 97.8 F (36.6 C) (Temporal)   Ht 5\' 4"  (1.626 m)   Wt 255 lb 9.6 oz (115.9 kg)    SpO2 98%   BMI 43.87 kg/m  General: no acute distress , A&Ox3 HEENT: PEERL, conjunctiva normal, neck is supple Cardiovascular:  RRR without murmur or gallop.  Respiratory:  Good breath sounds bilaterally, CTAB with normal respiratory effort Skin:  Warm, no rashes     Commons side effects, risks, benefits, and alternatives for medications and treatment plan prescribed today were discussed, and the patient expressed understanding of the given instructions. Patient is instructed to call or message via MyChart if he/she has any questions or concerns regarding our treatment plan. No barriers to understanding were identified. We discussed Red Flag symptoms and signs in detail. Patient expressed understanding regarding what to do in case of urgent or emergency type symptoms.   Medication list was reconciled, printed and provided to the patient in  AVS. Patient instructions and summary information was reviewed with the patient as documented in the AVS. This note was prepared with assistance of Dragon voice recognition software. Occasional wrong-word or sound-a-like substitutions may have occurred due to the inherent limitations of voice recognition software  This visit occurred during the SARS-CoV-2 public health emergency.  Safety protocols were in place, including screening questions prior to the visit, additional usage of staff PPE, and extensive cleaning of exam room while observing appropriate contact time as indicated for disinfecting solutions.

## 2019-09-06 NOTE — Patient Instructions (Signed)
Please return in 6 months for your annual complete physical; please come fasting.  Glad you are feeling better. Let's keep it going.   If you have any questions or concerns, please don't hesitate to send me a message via MyChart or call the office at [phone]. Thank you for visiting with [place] today! It's our pleasure caring for you.

## 2019-09-08 MED FILL — clonazePAM 1 MG TABS: 1 | 30 days supply | Qty: 60 | Fill #1

## 2019-09-09 ENCOUNTER — Encounter: Payer: Self-pay | Admitting: Family Medicine

## 2019-09-09 ENCOUNTER — Other Ambulatory Visit: Payer: Self-pay

## 2019-09-09 ENCOUNTER — Ambulatory Visit: Payer: BC Managed Care – PPO | Admitting: Family Medicine

## 2019-09-09 VITALS — BP 120/82 | HR 88 | Ht 64.0 in | Wt 258.0 lb

## 2019-09-09 DIAGNOSIS — M797 Fibromyalgia: Secondary | ICD-10-CM

## 2019-09-09 DIAGNOSIS — G894 Chronic pain syndrome: Secondary | ICD-10-CM

## 2019-09-09 DIAGNOSIS — M533 Sacrococcygeal disorders, not elsewhere classified: Secondary | ICD-10-CM | POA: Diagnosis not present

## 2019-09-09 DIAGNOSIS — G8929 Other chronic pain: Secondary | ICD-10-CM

## 2019-09-09 MED ORDER — PREGABALIN 75 MG PO CAPS: 75.0000 mg | ORAL_CAPSULE | Freq: Two times a day (BID) | ORAL | 3 refills | Status: DC

## 2019-09-09 NOTE — Progress Notes (Signed)
I, [name] [name], LAT, ATC, am serving as scribe for Dr. Lynne Leader.  [name] [name] is a 34 y.o. female who presents to [place] at [facility] today for follow-up back pain. [name] [name] has been seen several times since her first visit on January 19.  At that time she had had pain ongoing for several weeks resulted in multiple different ED visits for back pain and pain radiating to left calf.  She has had trials of prednisone, gabapentin, and Norco.  She been treated additionally with home exercise program, as well as injection at left ischial bursitis and left SI joint (both in February).  Work-up so far had been CT scan of abdomen and pelvis in [name] [name] as well as an MRI of the lumbar spine on January 30 which showed mild facet hypertrophy at L4-5 and L5-S1 but no significant canal or neuroforaminal stenosis. She has had pain bad enough that she has needed quite a bit of hydrocodone.  Since I started treating her in mid January she has had 11 refills of hydrocodone 5/325 (15 tablets at a time).  Since her last visit, pt reports she has discontinued hydrocodone after she ran out over a week ago.  She talked with her primary care provider and they are both pretty sure that she has fibromyalgia.  She would like to pursue other options other than narcotic pain control to control her pain.  She does not think it helped all that much anyway.  She does have a prescription for gabapentin that she thinks has not been very helpful.   Pertinent review of systems: No fevers or chills  Relevant historical information: Hypertension, bipolar disorder, history of Roux-en-Y bypass.   Exam:  BP 120/82 (BP Location: Right Arm, Patient Position: Sitting, Cuff Size: Large)   Pulse 88   Ht 5\' 4"  (1.626 m)   Wt 258 lb (117 kg)   SpO2 99%   BMI 44.29 kg/m  General: Well Developed, well nourished, and in no acute distress.   MSK: L-spine nontender midline.  Diffusely tender palpation  musculature back arms and legs.    Lab and Radiology Results  EXAM: MRI LUMBAR SPINE WITHOUT CONTRAST  TECHNIQUE: Multiplanar, multisequence MR imaging of the lumbar spine was performed. No intravenous contrast was administered.  COMPARISON:  CT 01/28/2019  FINDINGS: Segmentation:  5 lumbar type vertebral bodies.  Alignment:  Normal  Vertebrae:  Normal  Conus medullaris and cauda equina: Conus extends to the L1 level. Conus and cauda equina appear normal.  Paraspinal and other soft tissues: Normal  Disc levels:  All disc levels are normal. No disc degeneration, bulge or herniation. There is very minimal facet prominence at L4-5 and L5-S1. No encroachment upon the neural spaces. This could possibly be a cause of low back discomfort.  IMPRESSION: No disc pathology.  No canal or foraminal stenosis.  Very mild facet hypertrophy at L4-5 and L5-S1. Normally, I would expect this to be subclinical or asymptomatic. However, there is a possibility that it could be associated with low back   Electronically Signed   By: Nelson Chimes M.D.   On: 07/16/2019 13:11  I, Lynne Leader, personally (independently) visualized and performed the interpretation of the images attached in this note.  LABS: Lab Results  Component Value Date   VITAMINB12 345 07/12/2019   VITD: 24.37 07/12/19   Assessment and Plan: 34 y.o. female with diffuse body aches and pain.  Fibromyalgia is very likely at this point.  I  think it still possible she does have some discrete orthopedic or musculoskeletal problem that could be addressed further however I do not think that that is the main issue.  I do think fibromyalgia is very likely.  We will discontinue hydrocodone and try Lyrica.  Happy to continue to prescribe this medication the future if is helpful.  Patient keep me updated and recheck back as needed.   PDMP reviewed during this encounter. No orders of the defined types were placed in  this encounter.  Meds ordered this encounter  Medications  . pregabalin (LYRICA) 75 MG capsule    Sig: Take 1 capsule (75 mg total) by mouth 2 (two) times daily.    Dispense:  60 capsule    Refill:  3     Discussed warning signs or symptoms. Please see discharge instructions. Patient expresses understanding.   The above documentation has been reviewed and is accurate and complete Clementeen Graham   Total encounter time 30 minutes including charting time date of service. Medication side effects potential risks and next steps.

## 2019-09-09 NOTE — Patient Instructions (Signed)
Thank you for coming in today. Lets try to stop hydrocodone.  Try lyrica.  Let me know if it is not ok.    Myofascial Pain Syndrome and Fibromyalgia Myofascial pain syndrome and fibromyalgia are both pain disorders. This pain may be felt mainly in your muscles.  Myofascial pain syndrome: ? Always has tender points in the muscle that will cause pain when pressed (trigger points). The pain may come and go. ? Usually affects your neck, upper back, and shoulder areas. The pain often radiates into your arms and hands.  Fibromyalgia: ? Has muscle pains and tenderness that come and go. ? Is often associated with fatigue and sleep problems. ? Has trigger points. ? Tends to be long-lasting (chronic), but is not life-threatening. Fibromyalgia and myofascial pain syndrome are not the same. However, they often occur together. If you have both conditions, each can make the other worse. Both are common and can cause enough pain and fatigue to make day-to-day activities difficult. Both can be hard to diagnose because their symptoms are common in many other conditions. What are the causes? The exact causes of these conditions are not known. What increases the risk? You are more likely to develop this condition if:  You have a family history of the condition.  You have certain triggers, such as: ? Spine disorders. ? An injury (trauma) or other physical stressors. ? Being under a lot of stress. ? Medical conditions such as osteoarthritis, rheumatoid arthritis, or lupus. What are the signs or symptoms? Fibromyalgia The main symptom of fibromyalgia is widespread pain and tenderness in your muscles. Pain is sometimes described as stabbing, shooting, or burning. You may also have:  Tingling or numbness.  Sleep problems and fatigue.  Problems with attention and concentration (fibro fog). Other symptoms may include:  Bowel and bladder problems.  Headaches.  Visual problems.  Problems with  odors and noises.  Depression or mood changes.  Painful menstrual periods (dysmenorrhea).  Dry skin or eyes. These symptoms can vary over time. Myofascial pain syndrome Symptoms of myofascial pain syndrome include:  Tight, ropy bands of muscle.  Uncomfortable sensations in muscle areas. These may include aching, cramping, burning, numbness, tingling, and weakness.  Difficulty moving certain parts of the body freely (poor range of motion). How is this diagnosed? This condition may be diagnosed by your symptoms and medical history. You will also have a physical exam. In general:  Fibromyalgia is diagnosed if you have pain, fatigue, and other symptoms for more than 3 months, and symptoms cannot be explained by another condition.  Myofascial pain syndrome is diagnosed if you have trigger points in your muscles, and those trigger points are tender and cause pain elsewhere in your body (referred pain). How is this treated? Treatment for these conditions depends on the type that you have.  For fibromyalgia: ? Pain medicines, such as NSAIDs. ? Medicines for treating depression. ? Medicines for treating seizures. ? Medicines that relax the muscles.  For myofascial pain: ? Pain medicines, such as NSAIDs. ? Cooling and stretching of muscles. ? Trigger point injections. ? Sound wave (ultrasound) treatments to stimulate muscles. Treating these conditions often requires a team of health care providers. These may include:  Your primary care provider.  Physical therapist.  Complementary health care providers, such as massage therapists or acupuncturists.  Psychiatrist for cognitive behavioral therapy. Follow these instructions at home: Medicines  Take over-the-counter and prescription medicines only as told by your health care provider.  Do not drive or  use heavy machinery while taking prescription pain medicine.  If you are taking prescription pain medicine, take actions to  prevent or treat constipation. Your health care provider may recommend that you: ? Drink enough fluid to keep your urine pale yellow. ? Eat foods that are high in fiber, such as fresh fruits and vegetables, whole grains, and beans. ? Limit foods that are high in fat and processed sugars, such as fried or sweet foods. ? Take an over-the-counter or prescription medicine for constipation. Lifestyle   Exercise as directed by your health care provider or physical therapist.  Practice relaxation techniques to control your stress. You may want to try: ? Biofeedback. ? Visual imagery. ? Hypnosis. ? Muscle relaxation. ? Yoga. ? Meditation.  Maintain a healthy lifestyle. This includes eating a healthy diet and getting enough sleep.  Do not use any products that contain nicotine or tobacco, such as cigarettes and e-cigarettes. If you need help quitting, ask your health care provider. General instructions  Talk to your health care provider about complementary treatments, such as acupuncture or massage.  Consider joining a support group with others who are diagnosed with this condition.  Do not do activities that stress or strain your muscles. This includes repetitive motions and heavy lifting.  Keep all follow-up visits as told by your health care provider. This is important. Where to find more information  National Fibromyalgia Association: www.fmaware.org  Arthritis Foundation: www.arthritis.org  American Chronic Pain Association: www.theacpa.org Contact a health care provider if:  You have new symptoms.  Your symptoms get worse or your pain is severe.  You have side effects from your medicines.  You have trouble sleeping.  Your condition is causing depression or anxiety. Summary  Myofascial pain syndrome and fibromyalgia are pain disorders.  Myofascial pain syndrome has tender points in the muscle that will cause pain when pressed (trigger points). Fibromyalgia also has  muscle pains and tenderness that come and go, but this condition is often associated with fatigue and sleep disturbances.  Fibromyalgia and myofascial pain syndrome are not the same but often occur together, causing pain and fatigue that make day-to-day activities difficult.  Treatment for fibromyalgia includes taking medicines to relax the muscles and medicines for pain, depression, or seizures. Treatment for myofascial pain syndrome includes taking medicines for pain, cooling and stretching of muscles, and injecting medicines into trigger points.  Follow your health care provider's instructions for taking medicines and maintaining a healthy lifestyle. This information is not intended to replace advice given to you by your health care provider. Make sure you discuss any questions you have with your health care provider. Document Revised: 09/24/2018 Document Reviewed: 06/17/2017 Elsevier Patient Education  2020 Elsevier Inc.   Pregabalin capsules What is this medicine? PREGABALIN (pre GAB a lin) is used to treat nerve pain from diabetes, shingles, spinal cord injury, and fibromyalgia. It is also used to control seizures in epilepsy. This medicine may be used for other purposes; ask your health care provider or pharmacist if you have questions. COMMON BRAND NAME(S): Lyrica What should I tell my health care provider before I take this medicine? They need to know if you have any of these conditions: heart disease history of drug abuse or alcohol abuse problem kidney disease lung or breathing disease suicidal thoughts, plans, or attempt; a previous suicide attempt by you or a family member an unusual or allergic reaction to pregabalin, gabapentin, other medicines, foods, dyes, or preservatives pregnant or trying to get pregnant breast-feeding How  should I use this medicine? Take this medicine by mouth with a glass of water. Follow the directions on the prescription label. You can take it  with or without food. If it upsets your stomach, take it with food. Take your medicine at regular intervals. Do not take it more often than directed. Do not stop taking except on your doctor's advice. A special MedGuide will be given to you by the pharmacist with each prescription and refill. Be sure to read this information carefully each time. Talk to your pediatrician regarding the use of this medicine in children. While this drug may be prescribed for children as young as 1 month for selected conditions, precautions do apply. Overdosage: If you think you have taken too much of this medicine contact a poison control center or emergency room at once. NOTE: This medicine is only for you. Do not share this medicine with others. What if I miss a dose? If you miss a dose, take it as soon as you can. If it is almost time for your next dose, take only that dose. Do not take double or extra doses. What may interact with this medicine? This medicine may interact with the following medications: alcohol antihistamines for allergy, cough, and cold certain medicines for anxiety or sleep certain medicines for depression like amitriptyline, fluoxetine, sertraline certain medicines for diabetes certain medicines for seizures like phenobarbital, primidone general anesthetics like halothane, isoflurane, methoxyflurane, propofol local anesthetics like lidocaine, pramoxine, tetracaine medicines that relax muscles for surgery narcotic medicines for pain phenothiazines like chlorpromazine, mesoridazine, prochlorperazine, thioridazine This list may not describe all possible interactions. Give your health care provider a list of all the medicines, herbs, non-prescription drugs, or dietary supplements you use. Also tell them if you smoke, drink alcohol, or use illegal drugs. Some items may interact with your medicine. What should I watch for while using this medicine? Tell your doctor or healthcare professional if  your symptoms do not start to get better or if they get worse. Visit your doctor or health care professional for regular checks on your progress. Do not stop taking except on your doctor's advice. You may develop a severe reaction. Your doctor will tell you how much medicine to take. Wear a medical identification bracelet or chain if you are taking this medicine for seizures, and carry a card that describes your disease and details of your medicine and dosage times. You may get drowsy or dizzy. Do not drive, use machinery, or do anything that needs mental alertness until you know how this medicine affects you. Do not stand or sit up quickly, especially if you are an older patient. This reduces the risk of dizzy or fainting spells. Alcohol may interfere with the effect of this medicine. Avoid alcoholic drinks. If you have a heart condition, like congestive heart failure, and notice that you are retaining water and have swelling in your hands or feet, contact your health care provider immediately. The use of this medicine may increase the chance of suicidal thoughts or actions. Pay special attention to how you are responding while on this medicine. Any worsening of mood, or thoughts of suicide or dying should be reported to your health care professional right away. This medicine has caused reduced sperm counts in some men. This may interfere with the ability to father a child. You should talk to your doctor or health care professional if you are concerned about your fertility. Women who become pregnant while using this medicine for seizures may enroll  in the [place] American Antiepileptic Drug Pregnancy Registry by calling [phone]. This registry collects information about the safety of antiepileptic drug use during pregnancy. What side effects may I notice from receiving this medicine? Side effects that you should report to your doctor or health care professional as soon as possible: allergic reactions  like skin rash, itching or hives, swelling of the face, lips, or tongue breathing problems changes in vision chest pain confusion jerking or unusual movements of any part of your body loss of memory muscle pain, tenderness, or weakness suicidal thoughts or other mood changes swelling of the ankles, feet, hands unusual bruising or bleeding Side effects that usually do not require medical attention (report to your doctor or health care professional if they continue or are bothersome): dizziness drowsiness dry mouth headache nausea tremors trouble sleeping weight gain This list may not describe all possible side effects. Call your doctor for medical advice about side effects. You may report side effects to FDA at 1-800-FDA-1088. Where should I keep my medicine? Keep out of the reach of children. This medicine can be abused. Keep your medicine in a safe place to protect it from theft. Do not share this medicine with anyone. Selling or giving away this medicine is dangerous and against the law. This medicine may cause accidental overdose and death if it taken by other adults, children, or pets. Mix any unused medicine with a substance like cat litter or coffee grounds. Then throw the medicine away in a sealed container like a sealed bag or a coffee can with a lid. Do not use the medicine after the expiration date. Store at room temperature between 15 and 30 degrees C (59 and 86 degrees F). NOTE: This sheet is a summary. It may not cover all possible information. If you have questions about this medicine, talk to your doctor, pharmacist, or health care provider.  2020 Elsevier/Gold Standard (2018-06-04 13:15:55)

## 2019-09-13 ENCOUNTER — Encounter: Payer: Self-pay | Admitting: Family Medicine

## 2019-09-13 ENCOUNTER — Ambulatory Visit: Payer: No Typology Code available for payment source | Admitting: Family Medicine

## 2019-09-15 ENCOUNTER — Ambulatory Visit: Payer: No Typology Code available for payment source | Admitting: Psychology

## 2019-09-15 ENCOUNTER — Telehealth: Payer: BC Managed Care – PPO | Admitting: Physician Assistant

## 2019-09-15 DIAGNOSIS — K529 Noninfective gastroenteritis and colitis, unspecified: Secondary | ICD-10-CM | POA: Diagnosis not present

## 2019-09-15 DIAGNOSIS — R112 Nausea with vomiting, unspecified: Secondary | ICD-10-CM | POA: Diagnosis not present

## 2019-09-15 MED ORDER — PROMETHAZINE HCL 25 MG PO TABS: 25.0000 mg | ORAL_TABLET | Freq: Three times a day (TID) | ORAL | 0 refills | Status: DC | PRN

## 2019-09-15 NOTE — Progress Notes (Signed)
We are sorry that you are not feeling well. Here is how we plan to help!  Based on what you have shared with me it looks like you have a Virus that is irritating your GI tract.  Vomiting is the forceful emptying of a portion of the stomach's content through the mouth.  Although nausea and vomiting can make you feel miserable, it's important to remember that these are not diseases, but rather symptoms of an underlying illness.  When we treat short term symptoms, we always caution that any symptoms that persist should be fully evaluated in a medical office.  I have prescribed a medication that will help alleviate your symptoms and allow you to stay hydrated:  Promethazine 25 mg take 1 tablet twice daily  HOME CARE:  Drink clear liquids.  This is very important! Dehydration (the lack of fluid) can lead to a serious complication.  Start off with 1 tablespoon every 5 minutes for 8 hours.  You may begin eating bland foods after 8 hours without vomiting.  Start with saltine crackers, white bread, rice, mashed potatoes, applesauce.  After 48 hours on a bland diet, you may resume a normal diet.  Try to go to sleep.  Sleep often empties the stomach and relieves the need to vomit.  GET HELP RIGHT AWAY IF:   Your symptoms do not improve or worsen within 2 days after treatment.  You have a fever for over 3 days.  You cannot keep down fluids after trying the medication.  MAKE SURE YOU:   Understand these instructions.  Will watch your condition.  Will get help right away if you are not doing well or get worse.   Thank you for choosing an e-visit. Your e-visit answers were reviewed by a board certified advanced clinical practitioner to complete your personal care plan. Depending upon the condition, your plan could have included both over the counter or prescription medications. Please review your pharmacy choice. Be sure that the pharmacy you have chosen is open so that you can pick up your  prescription now.  If there is a problem you may message your provider in MyChart to have the prescription routed to another pharmacy. Your safety is important to us. If you have drug allergies check your prescription carefully.  For the next 24 hours, you can use MyChart to ask questions about today's visit, request a non-urgent call back, or ask for a work or school excuse from your e-visit provider. You will get an e-mail in the next two days asking about your experience. I hope that your e-visit has been valuable and will speed your recovery.   Greater than 5 minutes, yet less than 10 minutes of time have been spent researching, coordinating and implementing care for this patient today.   

## 2019-09-16 ENCOUNTER — Encounter: Payer: Self-pay | Admitting: Family Medicine

## 2019-09-17 MED FILL — CYANOCOBALAMIN 1,000 MCG/ML: 1000 | 84 days supply | Qty: 6 | Fill #1

## 2019-09-19 DIAGNOSIS — F3181 Bipolar II disorder: Secondary | ICD-10-CM | POA: Insufficient documentation

## 2019-09-19 HISTORY — DX: Bipolar II disorder: F31.81

## 2019-09-22 ENCOUNTER — Encounter: Payer: Self-pay | Admitting: Family Medicine

## 2019-09-22 MED ORDER — CLONAZEPAM 1 MG PO TABS: 0.5000 mg | ORAL_TABLET | Freq: Two times a day (BID) | ORAL | 5 refills | Status: DC | PRN

## 2019-09-26 ENCOUNTER — Encounter: Payer: Self-pay | Admitting: Family Medicine

## 2019-09-26 DIAGNOSIS — G4762 Sleep related leg cramps: Secondary | ICD-10-CM

## 2019-09-28 MED ORDER — TIZANIDINE HCL 2 MG PO CAPS: 4.0000 mg | ORAL_CAPSULE | Freq: Three times a day (TID) | ORAL | 1 refills | Status: DC

## 2019-10-05 ENCOUNTER — Encounter: Payer: Self-pay | Admitting: Family Medicine

## 2019-10-06 ENCOUNTER — Other Ambulatory Visit: Payer: Self-pay | Admitting: Family Medicine

## 2019-10-06 ENCOUNTER — Ambulatory Visit (INDEPENDENT_AMBULATORY_CARE_PROVIDER_SITE_OTHER): Payer: BC Managed Care – PPO | Admitting: Psychology

## 2019-10-06 DIAGNOSIS — G4762 Sleep related leg cramps: Secondary | ICD-10-CM

## 2019-10-06 DIAGNOSIS — F4322 Adjustment disorder with anxiety: Secondary | ICD-10-CM

## 2019-10-06 DIAGNOSIS — F5101 Primary insomnia: Secondary | ICD-10-CM | POA: Diagnosis not present

## 2019-10-06 NOTE — Telephone Encounter (Signed)
Patient sent below email today:   Hey. I just talked to the Medical Section of the [facility] for what information they needed.  On a letterhead from the office they need (all due to the syncope): -A Diagnosis as to why I had an episode (I was told due to severe dehydration, sleep loss) -The Treatment Plan (Stay hydrated) -Compliance and Clearance to Drive with regards to syncope -Date and Cause of Syncope (01/28/19 Severe Dehydration and Sleep Loss)  The quickest way for this to be resolved is to fax. There are 3 numbers that all go to the same place. He said that you could fax to all the numbers to make sure that it is received.  [phone]  Could you please either do this or have Dr. Modesta Messing CMA do it. I would assume since it was during the time I was with Dr. Earlene Plater, it would need to be her signature.  Please let me know when it is completed. I really appreciate your help with this!  Thanks, [name] [name]   Patient states [name] [name] does not about this accident.  Patient was last seen on 09/06/19 by [name] [name].  Would like to know if [name] [name] could complete this letter without patient coming in for an OV? Please advise.

## 2019-10-13 ENCOUNTER — Other Ambulatory Visit: Payer: Self-pay

## 2019-10-13 ENCOUNTER — Emergency Department (HOSPITAL_BASED_OUTPATIENT_CLINIC_OR_DEPARTMENT_OTHER): Payer: BC Managed Care – PPO

## 2019-10-13 ENCOUNTER — Encounter (HOSPITAL_BASED_OUTPATIENT_CLINIC_OR_DEPARTMENT_OTHER): Payer: Self-pay | Admitting: *Deleted

## 2019-10-13 ENCOUNTER — Emergency Department (HOSPITAL_BASED_OUTPATIENT_CLINIC_OR_DEPARTMENT_OTHER)
Admission: EM | Admit: 2019-10-13 | Discharge: 2019-10-13 | Disposition: A | Discharge status: Home / Self Care | Payer: BC Managed Care – PPO | Attending: Emergency Medicine | Admitting: Emergency Medicine

## 2019-10-13 DIAGNOSIS — E039 Hypothyroidism, unspecified: Secondary | ICD-10-CM | POA: Diagnosis not present

## 2019-10-13 DIAGNOSIS — Z87442 Personal history of urinary calculi: Secondary | ICD-10-CM | POA: Diagnosis not present

## 2019-10-13 DIAGNOSIS — R109 Unspecified abdominal pain: Secondary | ICD-10-CM | POA: Diagnosis not present

## 2019-10-13 LAB — CBC WITH DIFFERENTIAL/PLATELET
Abs Immature Granulocytes: 0.02 10*3/uL (ref 0.00–0.07)
Basophils Absolute: 0 10*3/uL (ref 0.0–0.1)
Basophils Relative: 1 %
Eosinophils Absolute: 0.1 10*3/uL (ref 0.0–0.5)
Eosinophils Relative: 1 %
HCT: 38.9 % (ref 36.0–46.0)
Hemoglobin: 13 g/dL (ref 12.0–15.0)
Immature Granulocytes: 0 %
Lymphocytes Relative: 25 %
Lymphs Abs: 2.2 10*3/uL (ref 0.7–4.0)
MCH: 28.4 pg (ref 26.0–34.0)
MCHC: 33.4 g/dL (ref 30.0–36.0)
MCV: 84.9 fL (ref 80.0–100.0)
Monocytes Absolute: 0.6 10*3/uL (ref 0.1–1.0)
Monocytes Relative: 7 %
Neutro Abs: 5.8 10*3/uL (ref 1.7–7.7)
Neutrophils Relative %: 66 %
Platelets: 321 10*3/uL (ref 150–400)
RBC: 4.58 MIL/uL (ref 3.87–5.11)
RDW: 13.9 % (ref 11.5–15.5)
WBC: 8.7 10*3/uL (ref 4.0–10.5)
nRBC: 0 % (ref 0.0–0.2)

## 2019-10-13 LAB — BASIC METABOLIC PANEL
Anion gap: 10 (ref 5–15)
BUN: 13 mg/dL (ref 6–20)
CO2: 24 mmol/L (ref 22–32)
Calcium: 8.6 mg/dL — ABNORMAL LOW (ref 8.9–10.3)
Chloride: 103 mmol/L (ref 98–111)
Creatinine, Ser: 0.6 mg/dL (ref 0.44–1.00)
GFR calc Af Amer: >60 mL/min (ref >60)
GFR calc non Af Amer: >60 mL/min (ref >60)
Glucose, Bld: 85 mg/dL (ref 70–99)
Potassium: 3.8 mmol/L (ref 3.5–5.1)
Sodium: 137 mmol/L (ref 135–145)

## 2019-10-13 LAB — URINALYSIS, ROUTINE W REFLEX MICROSCOPIC
Bilirubin Urine: NEGATIVE
Glucose, UA: NEGATIVE mg/dL
Hgb urine dipstick: NEGATIVE
Ketones, ur: NEGATIVE mg/dL
Leukocytes,Ua: NEGATIVE
Nitrite: NEGATIVE
Protein, ur: NEGATIVE mg/dL
Specific Gravity, Urine: 1.025 (ref 1.005–1.030)
pH: 7 (ref 5.0–8.0)

## 2019-10-13 IMAGING — CT CT RENAL STONE PROTOCOL
2 of 4 series · 16 of 46 positions shown, 18 images · non-contrast
Comparison: Most recent CT [DATE]

CLINICAL DATA: Left flank pain since this morning. Kidney stone
suspected.

EXAM:
CT ABDOMEN AND PELVIS WITHOUT CONTRAST
TECHNIQUE: Multidetector CT imaging of the abdomen and pelvis was performed
following the standard protocol without IV contrast.

[Series 2: axial st · axial · 0.98mm/px · z∈[-551,-76]mm · 13 of 105 slices shown, 15 images]
[im 5/105  soft-tissue]
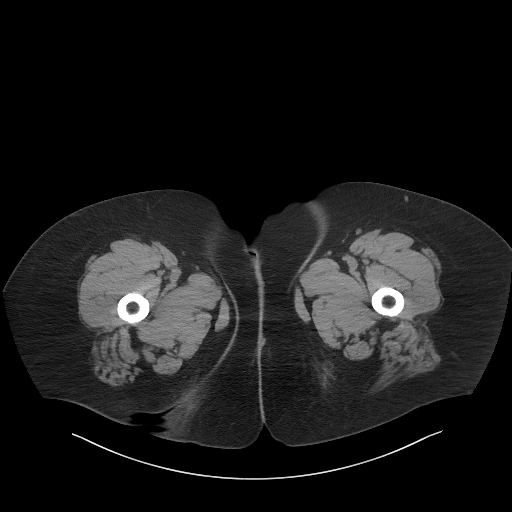
[im 5/105  bone]
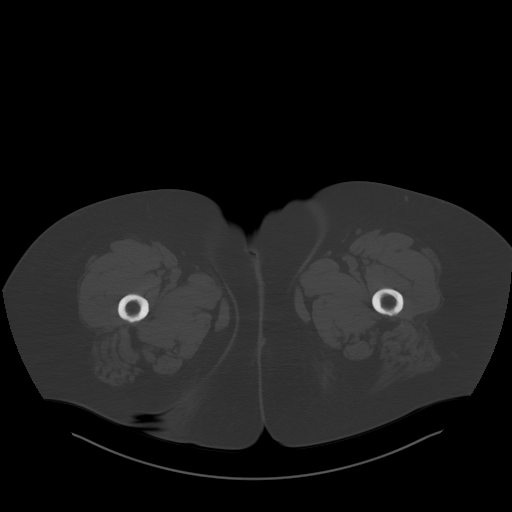
[im 13/105  soft-tissue]
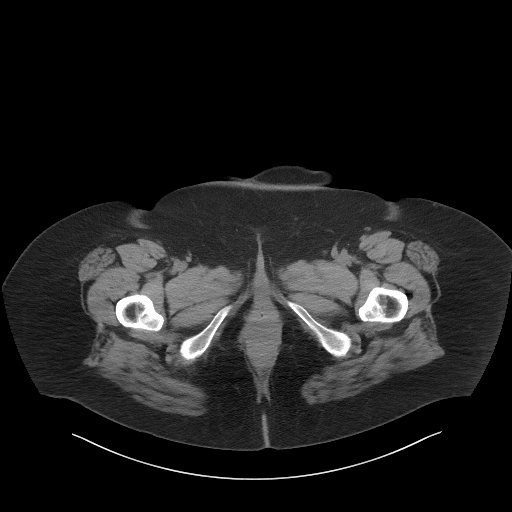
[im 21/105  soft-tissue]
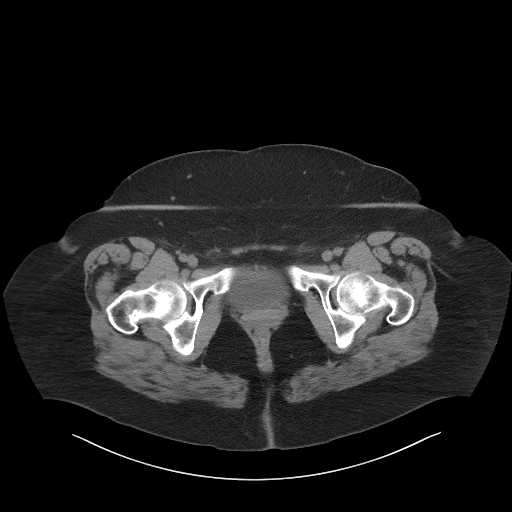
[im 30/105  soft-tissue]
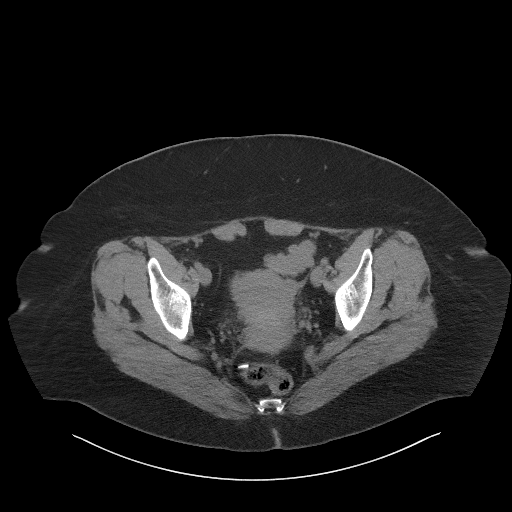
[im 38/105  soft-tissue]
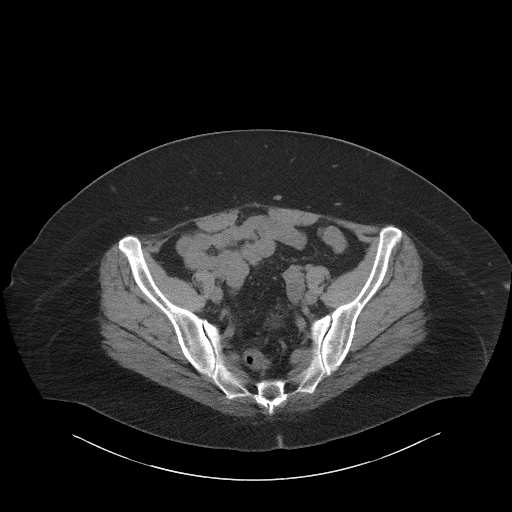
[im 46/105  soft-tissue]
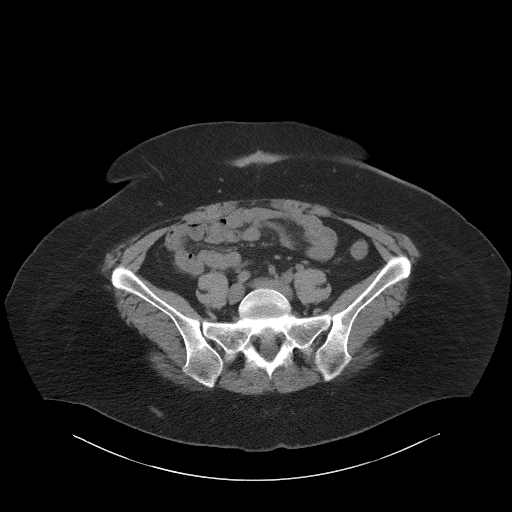
[im 55/105  soft-tissue]
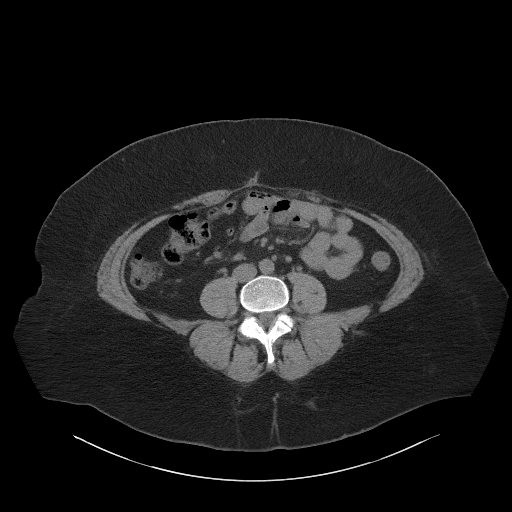
[im 59/105  soft-tissue]
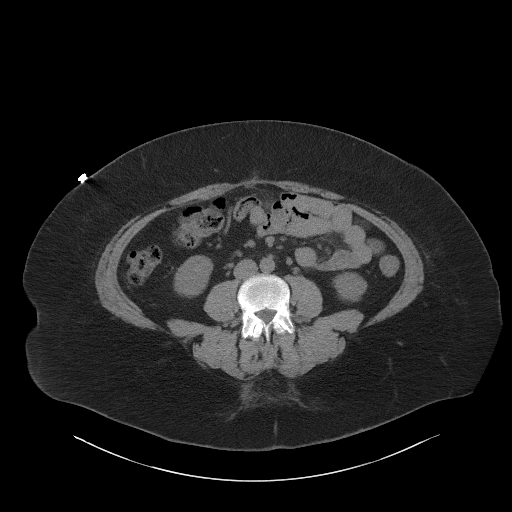
[im 67/105  soft-tissue]
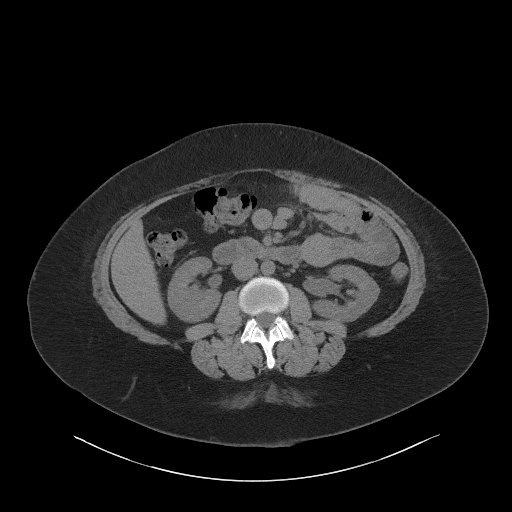
[im 67/105  bone]
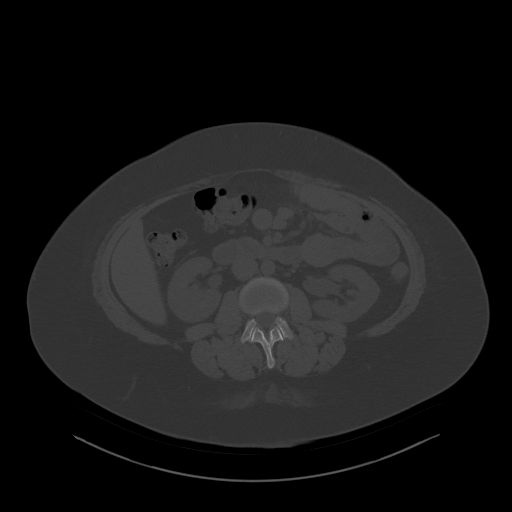
[im 75/105  soft-tissue]
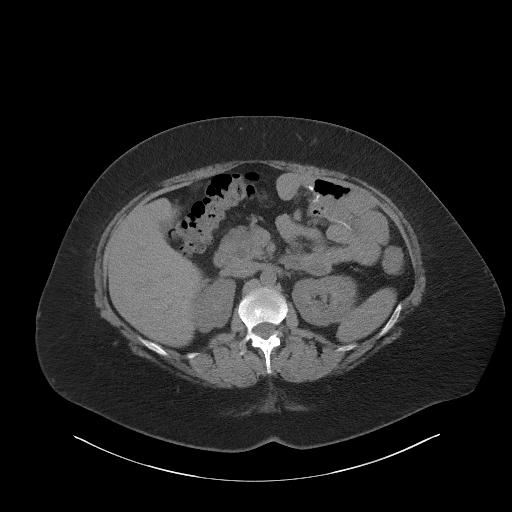
[im 84/105  soft-tissue]
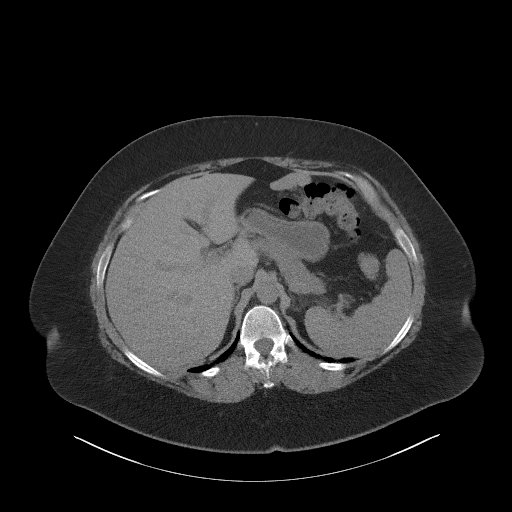
[im 92/105  soft-tissue]
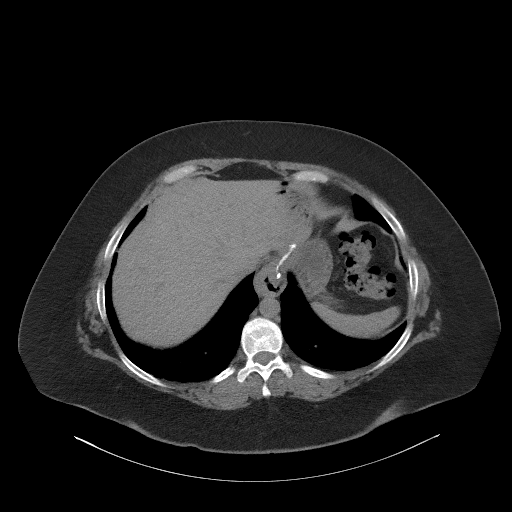
[im 100/105  soft-tissue]
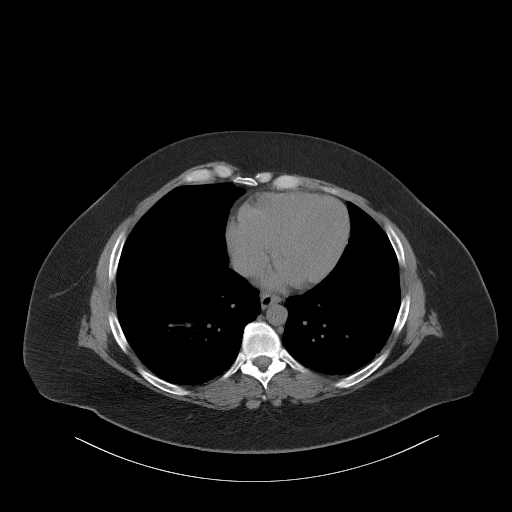

[Series 5: coronal st · coronal · 0.99mm/px · 3 of 114 slices shown]
[im 38/114  soft-tissue]
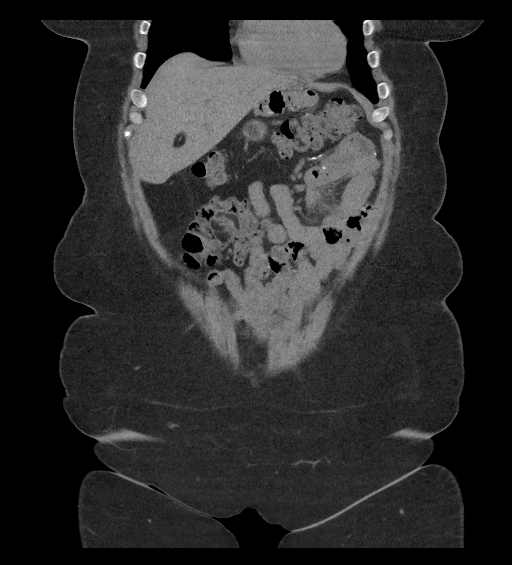
[im 51/114  soft-tissue]
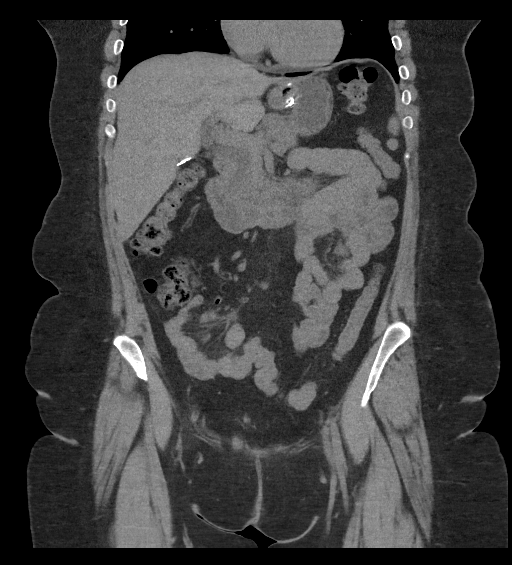
[im 63/114  soft-tissue]
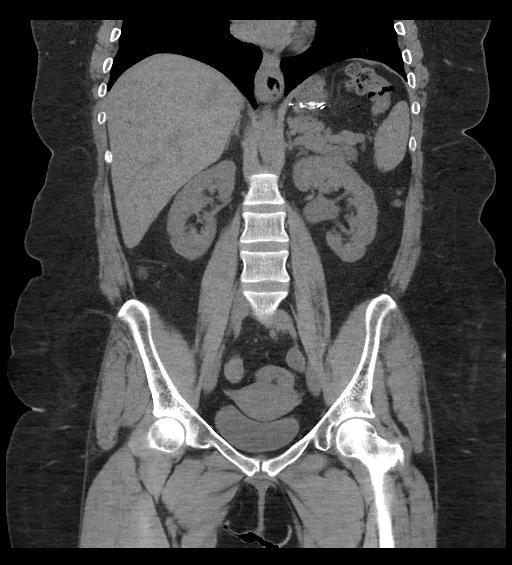

[16 of 46 positions shown; findings below may reference images not displayed]

FINDINGS: Lower chest: Hypoventilatory changes at the lung bases. Post gastric
bypass with gastric suture line extending across the diaphragmatic
hiatus. Small hiatal hernia.

Hepatobiliary: No focal liver abnormality is seen. Status post
cholecystectomy. No biliary dilatation.

Pancreas: No ductal dilatation or inflammation.

Spleen: Normal in size without focal abnormality.

Adrenals/Urinary Tract: Normal adrenal glands. Extrarenal pelvis
configuration of the left kidney, unchanged from prior exam. No
hydronephrosis or caliceal dilatation. No renal or ureteral calculi.
Right kidney is unremarkable. No perinephric edema. Urinary bladder
is partially distended. No bladder stone.

Stomach/Bowel: Post gastric bypass. Gastric suture line extends
across the diaphragmatic hiatus. There is a small hiatal hernia.
Excluded gastric remnant is decompressed. The Roux limb is
nondilated. Jejunal anastomosis is unremarkable. No small bowel
obstruction or inflammation. Small volume of colonic stool. No
colonic inflammation. Normal appendix. Cecum is high-riding in the
right mid abdomen.

Vascular/Lymphatic: Normal caliber abdominal aorta. No
abdominopelvic adenopathy.

Reproductive: Uterus and bilateral adnexa are unremarkable.

Other: No free air, free fluid, or intra-abdominal fluid collection.
Tiny fat containing umbilical hernia.

Musculoskeletal: There are no acute or suspicious osseous
abnormalities.
IMPRESSION: 1. No renal stones or obstructive uropathy. No acute abnormality in
the abdomen/pelvis.
2. Post gastric bypass without complication. Small hiatal hernia.

## 2019-10-13 MED ORDER — HYDROCODONE-ACETAMINOPHEN 5-325 MG PO TABS: 1.0000 | ORAL_TABLET | Freq: Four times a day (QID) | ORAL | 0 refills | Status: DC | PRN

## 2019-10-13 MED ORDER — ONDANSETRON 4 MG PO TBDP
ORAL_TABLET | ORAL | 0 refills | Status: DC
Start: 2019-10-13 — End: 2020-11-06

## 2019-10-13 MED ORDER — SODIUM CHLORIDE 0.9 % IV BOLUS
1000.0000 mL | Freq: Once | INTRAVENOUS | Status: AC
  Administered 2019-10-13: 1000 mL via INTRAVENOUS

## 2019-10-13 MED ORDER — KETOROLAC TROMETHAMINE 30 MG/ML IJ SOLN
30.0000 mg | Freq: Once | INTRAMUSCULAR | Status: AC
  Administered 2019-10-13: 30 mg via INTRAVENOUS
  Filled 2019-10-13: qty 1

## 2019-10-13 MED ORDER — ONDANSETRON HCL 4 MG/2ML IJ SOLN
4.0000 mg | Freq: Once | INTRAMUSCULAR | Status: AC
  Administered 2019-10-13: 15:00:00 4 mg via INTRAVENOUS
  Filled 2019-10-13: qty 2

## 2019-10-13 MED ORDER — MORPHINE SULFATE (PF) 4 MG/ML IV SOLN
4.0000 mg | Freq: Once | INTRAVENOUS | Status: AC
  Administered 2019-10-13: 15:00:00 4 mg via INTRAVENOUS
  Filled 2019-10-13: qty 1

## 2019-10-13 MED FILL — ONDANSETRON ODT 4 MG TABLET: 4 | 2 days supply | Qty: 10 | Fill #0

## 2019-10-13 NOTE — Discharge Instructions (Addendum)
Your lab work and CT scan today are reassuring, I suspect you recently passed a kidney stone prior to arrival, you can continue to have some ureteral spasm over the next day or so use pain and nausea medicine as needed.  Pain medicine can cause drowsiness do not take before driving.  Follow-up with your PCP and/or urologist if pain is not improving.

## 2019-10-13 NOTE — ED Triage Notes (Signed)
Lower back pain since yesterday. Urgency, nausea this am.

## 2019-10-13 NOTE — ED Provider Notes (Signed)
[facility] EMERGENCY DEPARTMENT Provider Note   CSN: [id number] Arrival date & time: 10/13/19  1219     History Chief Complaint  Patient presents with  . Back Pain    [name] [name] is a 34 y.o. female.  [name] [name] is a 34 y.o. female With a history of PCOS, GERD, kidney stones, obesity, bipolar disorder, anxiety and depression, who presents to the ED for evaluation of left-sided back and flank pain.  Pain began suddenly this morning.  Described as a sharp stabbing pain that intermittently worsens and then seems to ease off.  Pain sometimes radiates towards the left lower abdomen and groin. She states this feels like kidney stone she has had in the past.  When pain first started she noted some blood in her urine.  She reports some urgency, but  denies dysuria or urinary frequency.  She has had some nausea, but no vomiting. No fevers or chills. Denies nay back injury, no radiation of pain into her legs, no numbness, weakness or saddle anesthesia        Past Medical History:  Diagnosis Date  . Anemia   . Anxiety   . Bipolar 2 disorder ([place]) 09/19/2019  . DEPRESSION   . Family history of colon cancer 2019-05-29   Father, 59s; deceased.   Marland Kitchen GERD (gastroesophageal reflux disease)   . Hypothyroidism    Hx of, normalized TSH during pregnancy 2011  . Infertility, female   . Migraines   . Morbid obesity ([place])    s/p RY 08/2012 - start weight 290 pounds  . Nephrolithiasis   . PCOS (polycystic ovarian syndrome)   . PONV (postoperative nausea and vomiting)   . Pregnancy induced hypertension     Patient Active Problem List   Diagnosis Date Noted  . Bipolar 2 disorder ([place]) 09/19/2019  . Fibromyalgia 09/09/2019  . Ischial bursitis of left side 07/19/2019  . Vitamin B12 deficiency 06/01/2019  . Vitamin D deficiency 06/01/2019  . Panic disorder 06/01/2019  . Family history of colon cancer 05/29/2019  . Chronic constipation 2019-05-29  . Major depression,  recurrent, chronic ([place]) 29-May-2019  . Insomnia due to other mental disorder 03/13/2019  . Pain in both lower extremities 11/21/2018  . Nocturnal leg cramps 10/26/2018  . Nephrolithiasis   . Reduced libido, due to Lexapro 07/09/2017  . Binge eating disorder 04/23/2017  . Morbid obesity ([place]) 04/23/2017  . Acquired iron deficiency anemia due to decreased absorption 02/22/2015  . 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 Barbie Banner OSTEOTOMY Left 06/24/2017   Procedure: [employer];  Surgeon: Trula Slade, DPM;  Location: [facility];  Service: Podiatry;  Laterality: Left;  . BUNIONECTOMY Left 06/24/2017   Procedure: Annye English;  Surgeon: Trula Slade, DPM;  Location: [facility];  Service: Podiatry;  Laterality: Left;  . CESAREAN SECTION     x 2  . CHOLECYSTECTOMY N/A 02/15/2019   Procedure: LAPAROSCOPIC CHOLECYSTECTOMY WITH INTRAOPERATIVE CHOLANGIOGRAM;  Surgeon: Ileana Roup, MD;  Location: [facility];  Service: General;  Laterality: N/A;  . FOOT SURGERY    . 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   Procedure: LAPAROSCOPIC ROUX-EN-Y GASTRIC;  Surgeon: Gayland Curry, MD;  Location: [facility];  Service: General;  Laterality: N/A;  laparoscopic roux-en-y gastric bypass  . LITHOTRIPSY Left   . TONSILLECTOMY    . TUBAL LIGATION    . UPPER GI ENDOSCOPY  08/24/2012  Procedure: UPPER GI ENDOSCOPY;  Surgeon: Atilano Ina, MD;  Location: [facility];  Service: General;;  . WISDOM TOOTH EXTRACTION       OB History    Gravida  2   Para  2   Term  2   Preterm  0   AB  0   Living  2     SAB  0   TAB  0   Ectopic  0   Multiple  0   Live Births  2           Family History  Problem Relation Age of Onset  . Hyperlipidemia Father   . Hypertension Father   . Kidney disease Father   . Colon cancer Father 65  . Hypertension Mother   . Hypertension Maternal Grandmother   .  Uterine cancer Maternal Grandmother 55  . Diabetes Maternal Grandfather     Social History   Tobacco Use  . Smoking status: Never Smoker  . Smokeless tobacco: Never Used  Substance Use Topics  . Alcohol use: Yes    Comment: rare/socially  . Drug use: No    Home Medications Prior to Admission medications   Medication Sig Start Date End Date Taking? Authorizing Provider  acetaminophen (TYLENOL) 500 MG tablet Take 2 tablets (1,000 mg total) by mouth every 6 (six) hours as needed for moderate pain. 02/13/19   Fayrene Helper, PA-C  busPIRone (BUSPAR) 7.5 MG tablet Take 7.5 mg by mouth 3 (three) times daily.  08/24/19   [provider]  CALCIUM-MAGNESIUM-ZINC PO Take 3 tablets by mouth daily.    [provider]  clonazePAM (KLONOPIN) 1 MG tablet Take 0.5-1 tablets (0.5-1 mg total) by mouth 2 (two) times daily as needed for anxiety. 09/22/19   Willow Ora, MD  cyanocobalamin (,VITAMIN B-12,) 1000 MCG/ML injection Inject 1 vial per week for 3 weeks, then 1 vial per month thereafter Patient taking differently: Inject 100 mcg into the skin once a week. Tuesdays 05/30/19   Willow Ora, MD  diclofenac Sodium (VOLTAREN) 1 % GEL Apply 4 g topically 4 (four) times daily. 05/13/19   Palumbo, April, MD  ferrous sulfate 325 (65 FE) MG tablet Take 325 mg by mouth 2 (two) times daily with a meal.     [provider]  HYDROcodone-acetaminophen (NORCO) 5-325 MG tablet Take 1 tablet by mouth every 6 (six) hours as needed. 10/13/19   Dartha Lodge, PA-C  hydrOXYzine (VISTARIL) 50 MG capsule Take 50 mg by mouth every 6 (six) hours as needed. 08/24/19   [provider]  lamoTRIgine (LAMICTAL) 100 MG tablet Take 100 mg by mouth daily. 08/24/19   [provider]  Menaquinone-7 (VITAMIN K2 PO) Take 1 capsule by mouth daily.     [provider]  Multiple Vitamins-Iron (ONE-TABLET-DAILY/IRON PO) Take 1 tablet by mouth daily.    [provider]    ondansetron (ZOFRAN ODT) 4 MG disintegrating tablet 4mg  ODT q4 hours prn nausea/vomit 10/13/19   10/15/19, PA-C  pantoprazole (PROTONIX) 40 MG tablet Take 1 tablet (40 mg total) by mouth daily. 01/25/19   Esterwood, Amy S, PA-C  pregabalin (LYRICA) 75 MG capsule Take 1 capsule (75 mg total) by mouth 2 (two) times daily. 09/09/19   09/11/19, MD  promethazine (PHENERGAN) 25 MG tablet Take 1 tablet (25 mg total) by mouth every 8 (eight) hours as needed for nausea or vomiting. 09/15/19   McVey, 11/15/19, PA-C  QUEtiapine (SEROQUEL) 300 MG tablet Take 300 mg by mouth at bedtime.  08/24/19   [provider]  SYRINGE-NEEDLE, DISP, 3 ML 25G X 1" 3 ML MISC Use 1 needle per application once weekly 05/30/19   Willow Ora, MD  tizanidine (ZANAFLEX) 2 MG capsule Take 2 capsules (4 mg total) by mouth 3 (three) times daily. 09/28/19   Rodolph Bong, MD  valACYclovir (VALTREX) 1000 MG tablet Take 1 tablet (1,000 mg total) by mouth 3 (three) times daily. Patient taking differently: Take 1,000 mg by mouth 3 (three) times daily. AS NEEDED FOR OUTBREAKS 05/25/19   Daphine Deutscher, Mary-Margaret, FNP  Vitamin D, Ergocalciferol, (DRISDOL) 1.25 MG (50000 UT) CAPS capsule Take 50,000 Units by mouth once a week. Tuesday    [provider]    Allergies    Ambien [zolpidem] and Nsaids  Review of Systems   Review of Systems  Constitutional: Negative for chills and fever.  HENT: Negative.   Respiratory: Negative for cough and shortness of breath.   Cardiovascular: Negative for chest pain.  Gastrointestinal: Positive for nausea. Negative for abdominal pain and vomiting.  Genitourinary: Positive for flank pain, hematuria and urgency. Negative for dysuria, frequency, vaginal bleeding and vaginal discharge.  Musculoskeletal: Positive for back pain.  Skin: Negative for color change and rash.  Neurological: Negative for weakness and numbness.  All other systems reviewed and are  negative.   Physical Exam Updated Vital Signs BP 132/89   Pulse 89   Temp 98.4 F (36.9 C) (Oral)   Resp 20   Ht 5\' 4"  (1.626 m)   Wt 117.9 kg   SpO2 99%   BMI 44.63 kg/m   Physical Exam Vitals and nursing note reviewed.  Constitutional:      General: She is not in acute distress.    Appearance: Normal appearance. She is well-developed. She is obese. She is not ill-appearing or diaphoretic.  HENT:     Head: Normocephalic and atraumatic.     Mouth/Throat:     Mouth: Mucous membranes are moist.     Pharynx: Oropharynx is clear.  Eyes:     General:        Right eye: No discharge.        Left eye: No discharge.  Cardiovascular:     Rate and Rhythm: Normal rate and regular rhythm.     Pulses: Normal pulses.     Heart sounds: Normal heart sounds. No murmur. No friction rub. No gallop.   Pulmonary:     Effort: Pulmonary effort is normal. No respiratory distress.     Breath sounds: Normal breath sounds. No wheezing or rales.     Comments: Respirations equal and unlabored, patient able to speak in full sentences, lungs clear to auscultation bilaterally Abdominal:     General: Bowel sounds are normal. There is no distension.     Palpations: Abdomen is soft. There is no mass.     Tenderness: There is abdominal tenderness. There is no guarding.     Comments: Abdomen is soft, nondistended, bowel sounds present throughout, there is some tenderness over the left flank and in the left lower quadrant, no guarding or peritoneal signs, all other quadrants nontender  Musculoskeletal:        General: No deformity.     Cervical back: Neck supple.  Skin:    General: Skin is warm and dry.     Capillary Refill: Capillary refill takes less than 2 seconds.  Neurological:  Mental Status: She is alert.     Coordination: Coordination normal.     Comments: Speech is clear, able to follow commands Moves extremities without ataxia, coordination intact  Psychiatric:        Mood and Affect:  Mood normal.        Behavior: Behavior normal.     ED Results / Procedures / Treatments   Labs (all labs ordered are listed, but only abnormal results are displayed) Labs Reviewed  BASIC METABOLIC PANEL - Abnormal; Notable for the following components:      Result Value   Calcium 8.6 (*)    All other components within normal limits  URINALYSIS, ROUTINE W REFLEX MICROSCOPIC  CBC WITH DIFFERENTIAL/PLATELET    EKG None  Radiology CT Renal Stone Study  Result Date: 10/13/2019 CLINICAL DATA:  Left flank pain since this morning. Kidney stone suspected. EXAM: CT ABDOMEN AND PELVIS WITHOUT CONTRAST TECHNIQUE: Multidetector CT imaging of the abdomen and pelvis was performed following the standard protocol without IV contrast. COMPARISON:  Most recent CT 01/28/2019 FINDINGS: Lower chest: Hypoventilatory changes at the lung bases. Post gastric bypass with gastric suture line extending across the diaphragmatic hiatus. Small hiatal hernia. Hepatobiliary: No focal liver abnormality is seen. Status post cholecystectomy. No biliary dilatation. Pancreas: No ductal dilatation or inflammation. Spleen: Normal in size without focal abnormality. Adrenals/Urinary Tract: Normal adrenal glands. Extrarenal pelvis configuration of the left kidney, unchanged from prior exam. No hydronephrosis or caliceal dilatation. No renal or ureteral calculi. Right kidney is unremarkable. No perinephric edema. Urinary bladder is partially distended. No bladder stone. Stomach/Bowel: Post gastric bypass. Gastric suture line extends across the diaphragmatic hiatus. There is a small hiatal hernia. Excluded gastric remnant is decompressed. The Roux limb is nondilated. Jejunal anastomosis is unremarkable. No small bowel obstruction or inflammation. Small volume of colonic stool. No colonic inflammation. Normal appendix. Cecum is high-riding in the right mid abdomen. Vascular/Lymphatic: Normal caliber abdominal aorta. No abdominopelvic  adenopathy. Reproductive: Uterus and bilateral adnexa are unremarkable. Other: No free air, free fluid, or intra-abdominal fluid collection. Tiny fat containing umbilical hernia. Musculoskeletal: There are no acute or suspicious osseous abnormalities. IMPRESSION: 1. No renal stones or obstructive uropathy. No acute abnormality in the abdomen/pelvis. 2. 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 Small hiatal hernia. Electronically Signed   By: Narda Rutherford M.D.   On: 10/13/2019 15:43    Procedures Procedures (including critical care time)  Medications Ordered in ED Medications  sodium chloride 0.9 % bolus 1,000 mL (0 mLs Intravenous Stopped 10/13/19 1545)  ondansetron (ZOFRAN) injection 4 mg (4 mg Intravenous Given 10/13/19 1435)  morphine 4 MG/ML injection 4 mg (4 mg Intravenous Given 10/13/19 1435)  ketorolac (TORADOL) 30 MG/ML injection 30 mg (30 mg Intravenous Given 10/13/19 1730)    ED Course  I have reviewed the triage vital signs and the nursing notes.  Pertinent labs & imaging results that were available during my care of the patient were reviewed by me and considered in my medical decision making (see chart for details).    MDM Rules/Calculators/A&P                      Patient presents to the ED with complaints of flank/abdominal pain. Patient nontoxic appearing, vitals without significant abnormalities. On exam patient is tender over left flank and in LLQ, no peritoneal signs. DDX: nephrolithiasis, pyelonephritis/UTI, cholecystitis, pancreatitis, bowel obstruction/perforation, appendicitis, dissection, feel nephrolithiasis is most likely at this time will evaluate with labs and CT  renal study, analgesics, anti-emetics, and fluids ordered.   No leukocytosis, anemia, or significant electrolyte derangements.  CT renal study without evidence of renal stone or other acute abnormality. Renal function preserved. Urinalysis without appearance of superimposed infection.Pt reports hematuria  earlier today which has since resolved, suspect pt has already passed stone and is continuing to have some ureteral spasm.  Patient tolerating PO in the ER with pain well controlled. Will discharge home with Zofran, NSAID, and Percocet with urology follow up. [place] Controlled Substance reporting System queried. I discussed results, treatment plan, need for urology follow-up, and return precautions with the patient. Provided opportunity for questions, patient confirmed understanding and is in agreement with plan.   Final Clinical Impression(s) / ED Diagnoses Final diagnoses:  Left flank pain    Rx / DC Orders ED Discharge Orders         Ordered    ondansetron (ZOFRAN ODT) 4 MG disintegrating tablet     10/13/19 1722    HYDROcodone-acetaminophen (NORCO) 5-325 MG tablet  Every 6 hours PRN     10/13/19 1749           Dartha LodgeFord, Gwyn Hieronymus N, PA-C 10/18/19 0131    Terald Sleeperrifan, Matthew J, MD 10/18/19 2019

## 2019-10-20 ENCOUNTER — Ambulatory Visit (INDEPENDENT_AMBULATORY_CARE_PROVIDER_SITE_OTHER): Payer: BC Managed Care – PPO

## 2019-10-20 ENCOUNTER — Other Ambulatory Visit: Payer: Self-pay

## 2019-10-20 ENCOUNTER — Encounter: Payer: Self-pay | Admitting: Podiatry

## 2019-10-20 ENCOUNTER — Ambulatory Visit: Payer: BC Managed Care – PPO | Admitting: Podiatry

## 2019-10-20 VITALS — Temp 98.1°F

## 2019-10-20 DIAGNOSIS — M778 Other enthesopathies, not elsewhere classified: Secondary | ICD-10-CM

## 2019-10-20 DIAGNOSIS — M722 Plantar fascial fibromatosis: Secondary | ICD-10-CM | POA: Diagnosis not present

## 2019-10-20 DIAGNOSIS — M779 Enthesopathy, unspecified: Secondary | ICD-10-CM

## 2019-10-20 NOTE — Patient Instructions (Signed)

## 2019-10-27 ENCOUNTER — Encounter: Payer: Self-pay | Admitting: Family Medicine

## 2019-10-27 NOTE — Progress Notes (Signed)
Subjective: 34 year old female presents the office today for concerns of bilateral foot pain.  She states that the pain is in the bottom of the heels and hurts mostly when she first gets up.  She said the pain discomfort in the ankle at times.  She has pain when she first gets up after.  The rest gets better with activity.  Denies any recent injury.  This been ongoing since March.  No recent weakness or falls.  She has no other concerns today. Denies any systemic complaints such as fevers, chills, nausea, vomiting. No acute changes since last appointment, and no other complaints at this time.   Objective: AAO x3, NAD DP/PT pulses palpable bilaterally, CRT less than 3 seconds Tenderness to palpation along the plantar medial tubercle of the calcaneus at the insertion of plantar fascia on the left and right foot. There is no pain along the course of the plantar fascia within the arch of the foot. Plantar fascia appears to be intact. There is no pain with lateral compression of the calcaneus or pain with vibratory sensation. There is no pain along the course or insertion of the achilles tendon. No other areas of tenderness to bilateral lower extremities.  No significant tenderness to palpation of the ankle today.  Flatfoot is present.  Negative Tinel sign. No open lesions or pre-ulcerative lesions.  No pain with calf compression, swelling, warmth, erythema  Assessment: Bilateral heel pain, plantar fasciitis  Plan: -All treatment options discussed with the patient including all alternatives, risks, complications.  -X-rays obtained reviewed.  Flatfoot is evident previous bunion surgery is noted.  There is no evidence of acute fracture. -Bilateral steroid injection performed.  See procedure note below. -Plan fascial brace dispensed.  She has an ankle brace as needed.  I think that she has been having some ankle pain due to compensation. -Continue orthotics and supportive shoes -Stretching, icing  daily -Patient encouraged to call the office with any questions, concerns, change in symptoms.   Procedure: Injection Tendon/Ligament Discussed alternatives, risks, complications and verbal consent was obtained.  Location: Bilateral plantar fascia at the glabrous junction; medial approach. Skin Prep: Alcohol. Injectate: 0.5cc 0.5% marcaine plain, 0.5 cc 2% lidocaine plain and, 1 cc kenalog 10. Disposition: Patient tolerated procedure well. Injection site dressed with a band-aid.  Post-injection care was discussed and return precautions discussed.   Return for bilateral foot/ankle pain.  [name] DPM

## 2019-10-31 DIAGNOSIS — M722 Plantar fascial fibromatosis: Secondary | ICD-10-CM | POA: Insufficient documentation

## 2019-11-02 DIAGNOSIS — F329 Major depressive disorder, single episode, unspecified: Secondary | ICD-10-CM | POA: Diagnosis not present

## 2019-11-02 DIAGNOSIS — F319 Bipolar disorder, unspecified: Secondary | ICD-10-CM | POA: Diagnosis not present

## 2019-11-02 DIAGNOSIS — Z9884 Bariatric surgery status: Secondary | ICD-10-CM | POA: Diagnosis not present

## 2019-11-04 ENCOUNTER — Ambulatory Visit
Admission: EM | Admit: 2019-11-04 | Discharge: 2019-11-04 | Disposition: A | Discharge status: Home / Self Care | Payer: BC Managed Care – PPO | Attending: Family Medicine | Admitting: Family Medicine

## 2019-11-04 ENCOUNTER — Other Ambulatory Visit: Payer: Self-pay

## 2019-11-04 DIAGNOSIS — S61011A Laceration without foreign body of right thumb without damage to nail, initial encounter: Secondary | ICD-10-CM | POA: Diagnosis not present

## 2019-11-04 MED ORDER — HYDROCODONE-ACETAMINOPHEN 5-325 MG PO TABS: ORAL_TABLET | ORAL | 0 refills | Status: DC

## 2019-11-04 MED ORDER — AMOXICILLIN 875 MG PO TABS
875.0000 mg | ORAL_TABLET | Freq: Two times a day (BID) | ORAL | 0 refills | Status: DC
Start: 2019-11-04 — End: 2020-01-03

## 2019-11-04 NOTE — ED Provider Notes (Signed)
[facility]    CSN: [id number] Arrival date & time: 11/04/19  1256      History   Chief Complaint Chief Complaint  Patient presents with  . Laceration    HPI [name] [name] is a 34 y.o. female.   34 yo female presents with a c/o left thumb laceration. States she cut her thumb with a dirty glass this morning. Patient is up to date on her tetanus vaccine.    Laceration   Past Medical History:  Diagnosis Date  . Anemia   . Anxiety   . Bipolar 2 disorder (HCC) 09/19/2019  . DEPRESSION   . Family history of colon cancer 05/27/2019   Father, 4s; deceased.   Marland Kitchen GERD (gastroesophageal reflux disease)   . Hypothyroidism    Hx of, normalized TSH during pregnancy 2011  . Infertility, female   . Migraines   . Morbid obesity (HCC)    s/p RY 08/2012 - start weight 290 pounds  . Nephrolithiasis   . PCOS (polycystic ovarian syndrome)   . PONV (postoperative nausea and vomiting)   . Pregnancy induced hypertension     Patient Active Problem List   Diagnosis Date Noted  . Plantar fasciitis 10/31/2019  . Bipolar 2 disorder (HCC) 09/19/2019  . Fibromyalgia 09/09/2019  . Ischial bursitis of left side 07/19/2019  . Vitamin B12 deficiency 06/01/2019  . Vitamin D deficiency 06/01/2019  . Panic disorder 06/01/2019  . Family history of colon cancer 2019/05/27  . Chronic constipation May 27, 2019  . Major depression, recurrent, chronic (HCC) 2019-05-27  . Insomnia due to other mental disorder 03/13/2019  . Pain in both lower extremities 11/21/2018  . Nocturnal leg cramps 10/26/2018  . Nephrolithiasis   . Reduced libido, due to Lexapro 07/09/2017  . Binge eating disorder 04/23/2017  . Morbid obesity (HCC) 04/23/2017  . Acquired iron deficiency anemia due to decreased absorption 02/22/2015  . 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 Quintella Reichert OSTEOTOMY Left 06/24/2017   Procedure: Ralene Bathe;  Surgeon:  [name], DPM;  Location: [facility];  Service: Podiatry;  Laterality: Left;  . BUNIONECTOMY Left 06/24/2017   Procedure: Anthoney Harada;  Surgeon: [name], DPM;  Location: [facility];  Service: Podiatry;  Laterality: Left;  . CESAREAN SECTION     x 2  . CHOLECYSTECTOMY N/A 02/15/2019   Procedure: LAPAROSCOPIC CHOLECYSTECTOMY WITH INTRAOPERATIVE CHOLANGIOGRAM;  Surgeon: Andria Meuse, MD;  Location: [facility];  Service: General;  Laterality: N/A;  . FOOT SURGERY    . 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   Procedure: LAPAROSCOPIC ROUX-EN-Y GASTRIC;  Surgeon: Atilano Ina, MD;  Location: [facility];  Service: General;  Laterality: N/A;  laparoscopic roux-en-y gastric bypass  . LITHOTRIPSY Left   . TONSILLECTOMY    . TUBAL LIGATION    . UPPER GI ENDOSCOPY  08/24/2012   Procedure: UPPER GI ENDOSCOPY;  Surgeon: Atilano Ina, MD;  Location: [facility];  Service: General;;  . WISDOM TOOTH EXTRACTION      OB History    Gravida  2   Para  2   Term  2   Preterm  0   AB  0   Living  2     SAB  0   TAB  0   Ectopic  0   Multiple  0   Live Births  2  Home Medications    Prior to Admission medications   Medication Sig Start Date End Date Taking? Authorizing Provider  acetaminophen (TYLENOL) 500 MG tablet Take 2 tablets (1,000 mg total) by mouth every 6 (six) hours as needed for moderate pain. 02/13/19  Yes Fayrene Helper, PA-C  CALCIUM-MAGNESIUM-ZINC PO Take 3 tablets by mouth daily.   Yes [provider]  clonazePAM (KLONOPIN) 1 MG tablet Take 0.5-1 tablets (0.5-1 mg total) by mouth 2 (two) times daily as needed for anxiety. 09/22/19  Yes Willow Ora, MD  cyanocobalamin (,VITAMIN B-12,) 1000 MCG/ML injection Inject 1 vial per week for 3 weeks, then 1 vial per month thereafter Patient taking differently: Inject 100 mcg into the skin once a week. Tuesdays 05/30/19  Yes Willow Ora, MD  diclofenac Sodium  (VOLTAREN) 1 % GEL Apply 4 g topically 4 (four) times daily. 05/13/19  Yes Palumbo, April, MD  ferrous sulfate 325 (65 FE) MG tablet Take 325 mg by mouth 2 (two) times daily with a meal.    Yes [provider]  hydrOXYzine (VISTARIL) 50 MG capsule Take 50 mg by mouth every 6 (six) hours as needed. 08/24/19  Yes [provider]  Menaquinone-7 (VITAMIN K2 PO) Take 1 capsule by mouth daily.    Yes [provider]  Multiple Vitamins-Iron (ONE-TABLET-DAILY/IRON PO) Take 1 tablet by mouth daily.   Yes [provider]  ondansetron (ZOFRAN ODT) 4 MG disintegrating tablet 4mg  ODT q4 hours prn nausea/vomit 10/13/19  Yes Ford, Kelsey N, PA-C  pantoprazole (PROTONIX) 40 MG tablet Take 1 tablet (40 mg total) by mouth daily. 01/25/19  Yes Esterwood, Amy S, PA-C  pregabalin (LYRICA) 75 MG capsule Take 1 capsule (75 mg total) by mouth 2 (two) times daily. 09/09/19  Yes 09/11/19, MD  QUEtiapine (SEROQUEL) 300 MG tablet Take 300 mg by mouth at bedtime.  08/24/19  Yes [provider]  SYRINGE-NEEDLE, DISP, 3 ML 25G X 1" 3 ML MISC Use 1 needle per application once weekly 05/30/19  Yes 06/01/19, MD  tizanidine (ZANAFLEX) 2 MG capsule Take 2 capsules (4 mg total) by mouth 3 (three) times daily. 09/28/19  Yes 09/30/19, MD  valACYclovir (VALTREX) 1000 MG tablet Take 1 tablet (1,000 mg total) by mouth 3 (three) times daily. Patient taking differently: Take 1,000 mg by mouth 3 (three) times daily. AS NEEDED FOR OUTBREAKS 05/25/19  Yes Martin, Mary-Margaret, FNP  Vitamin D, Ergocalciferol, (DRISDOL) 1.25 MG (50000 UT) CAPS capsule Take 50,000 Units by mouth once a week. Tuesday   Yes [provider]  amoxicillin (AMOXIL) 875 MG tablet Take 1 tablet (875 mg total) by mouth 2 (two) times daily. 11/04/19   11/06/19, MD  busPIRone (BUSPAR) 7.5 MG tablet Take 7.5 mg by mouth 3 (three) times daily.  08/24/19   [provider]  HYDROcodone-acetaminophen  (NORCO/VICODIN) 5-325 MG tablet 1-2 tabs po qd prn 11/04/19   11/06/19, MD  lamoTRIgine (LAMICTAL) 100 MG tablet Take 100 mg by mouth daily. 08/24/19   [provider]  promethazine (PHENERGAN) 25 MG tablet Take 1 tablet (25 mg total) by mouth every 8 (eight) hours as needed for nausea or vomiting. 09/15/19   McVey, 11/15/19, PA-C    Family History Family History  Problem Relation Age of Onset  . Hyperlipidemia Father   . Hypertension Father   . Kidney disease Father   . Colon cancer Father 36  . Hypertension Mother   .  Hypertension Maternal Grandmother   . Uterine cancer Maternal Grandmother 76  . Diabetes Maternal Grandfather     Social History Social History   Tobacco Use  . Smoking status: Never Smoker  . Smokeless tobacco: Never Used  Substance Use Topics  . Alcohol use: Yes    Comment: rare/socially  . Drug use: No     Allergies   Ambien [zolpidem] and Nsaids   Review of Systems Review of Systems   Physical Exam Triage Vital Signs ED Triage Vitals  Enc Vitals Group     BP 11/04/19 1320 (!) 155/103     Pulse Rate 11/04/19 1320 93     Resp 11/04/19 1320 18     Temp 11/04/19 1320 98.7 F (37.1 C)     Temp Source 11/04/19 1320 Oral     SpO2 11/04/19 1320 100 %     Weight 11/04/19 1318 269 lb (122 kg)     Height 11/04/19 1318 5\' 4"  (1.626 m)     Head Circumference --      Peak Flow --      Pain Score 11/04/19 1318 7     Pain Loc --      Pain Edu? --      Excl. in [place]? --    No data found.  Updated Vital Signs BP (!) 155/103 (BP Location: Left Arm)   Pulse 93   Temp 98.7 F (37.1 C) (Oral)   Resp 18   Ht 5\' 4"  (1.626 m)   Wt 122 kg   SpO2 100%   BMI 46.17 kg/m   Visual Acuity Right Eye Distance:   Left Eye Distance:   Bilateral Distance:    Right Eye Near:   Left Eye Near:    Bilateral Near:     Physical Exam Vitals and nursing note reviewed.  Constitutional:      General: She is not in acute distress.     Appearance: She is not toxic-appearing or diaphoretic.  Musculoskeletal:     Left hand: Swelling, laceration (distal palmar thumb skin; no nail involvement) and tenderness (at laceration site) present. No bony tenderness. Normal range of motion. Normal strength. Normal sensation. There is no disruption of two-point discrimination. Normal capillary refill. Normal pulse.     Comments: Left thumb normal range of motion and strength; thumb/hand neurovascularly intact  Neurological:     Mental Status: She is alert.      UC Treatments / Results  Labs (all labs ordered are listed, but only abnormal results are displayed) Labs Reviewed - No data to display  EKG   Radiology No results found.  Procedures Laceration Repair  Date/Time: 11/04/2019 2:37 PM Performed by: Norval Gable, MD Authorized by: Norval Gable, MD   Consent:    Consent obtained:  Verbal   Consent given by:  Patient   Risks discussed:  Infection, need for additional repair, nerve damage, poor wound healing, poor cosmetic result, pain, retained foreign body, tendon damage and vascular damage   Alternatives discussed:  No treatment Anesthesia (see MAR for exact dosages):    Anesthesia method:  Local infiltration   Local anesthetic:  Lidocaine 1% w/o epi Laceration details:    Location:  Finger   Finger location:  L thumb   Length (cm):  2 Repair type:    Repair type:  Simple Pre-procedure details:    Preparation:  Patient was prepped and draped in usual sterile fashion Exploration:    Hemostasis achieved with:  Direct pressure  Wound exploration: wound explored through full range of motion and entire depth of wound probed and visualized     Wound extent: areolar tissue violated     Contaminated: yes   Treatment:    Area cleansed with:  Betadine   Amount of cleaning:  Extensive   Irrigation solution:  Sterile saline   Irrigation method:  Syringe   Visualized foreign bodies/material removed: yes   Skin  repair:    Repair method:  Sutures   Suture size:  5-0   Suture material:  Nylon   Suture technique:  Simple interrupted   Number of sutures:  5 Approximation:    Approximation:  Close Post-procedure details:    Dressing:  Antibiotic ointment, non-adherent dressing and splint for protection   Patient tolerance of procedure:  Tolerated well, no immediate complications   (including critical care time)  Medications Ordered in UC Medications - No data to display  Initial Impression / Assessment and Plan / UC Course  I have reviewed the triage vital signs and the nursing notes.  Pertinent labs & imaging results that were available during my care of the patient were reviewed by me and considered in my medical decision making (see chart for details).     Final Clinical Impressions(s) / UC Diagnoses   Final diagnoses:  Laceration of right thumb without foreign body without damage to nail, initial encounter     Discharge Instructions     Tylenol as needed for pain Keep wound clean Follow up in 1 week for suture removal or sooner as needed if any problems    ED Prescriptions    Medication Sig Dispense Auth. Provider   amoxicillin (AMOXIL) 875 MG tablet Take 1 tablet (875 mg total) by mouth 2 (two) times daily. 14 tablet Kayelee Herbig, Pamala Hurry, MD   HYDROcodone-acetaminophen (NORCO/VICODIN) 5-325 MG tablet 1-2 tabs po qd prn 4 tablet Payton Mccallum, MD      1. diagnosis reviewed with patient 2. Procedure as per note above 3. rx as per orders above; reviewed possible side effects, interactions, risks and benefits  4. Recommend supportive treatment with routine wound care  5. Follow-up prn if symptoms worsen or don't improve  I have reviewed the PDMP during this encounter.   Payton Mccallum, MD 11/04/19 [id number]

## 2019-11-04 NOTE — Discharge Instructions (Signed)
Tylenol as needed for pain Keep wound clean Follow up in 1 week for suture removal or sooner as needed if any problems

## 2019-11-04 NOTE — ED Triage Notes (Signed)
Patient states that she cut her right thumb with a dirty glass around 930 this morning.

## 2019-11-07 ENCOUNTER — Ambulatory Visit (INDEPENDENT_AMBULATORY_CARE_PROVIDER_SITE_OTHER): Payer: BC Managed Care – PPO | Admitting: Psychology

## 2019-11-07 DIAGNOSIS — F5101 Primary insomnia: Secondary | ICD-10-CM | POA: Diagnosis not present

## 2019-11-07 DIAGNOSIS — F4322 Adjustment disorder with anxiety: Secondary | ICD-10-CM

## 2019-11-08 ENCOUNTER — Emergency Department
Admission: EM | Admit: 2019-11-08 | Discharge: 2019-11-08 | Disposition: A | Discharge status: Home / Self Care | Payer: BC Managed Care – PPO | Source: Home / Self Care

## 2019-11-08 ENCOUNTER — Other Ambulatory Visit: Payer: Self-pay

## 2019-11-08 ENCOUNTER — Emergency Department (INDEPENDENT_AMBULATORY_CARE_PROVIDER_SITE_OTHER): Payer: BC Managed Care – PPO

## 2019-11-08 ENCOUNTER — Telehealth: Payer: Self-pay | Admitting: Family Medicine

## 2019-11-08 DIAGNOSIS — M79644 Pain in right finger(s): Secondary | ICD-10-CM

## 2019-11-08 DIAGNOSIS — S61011A Laceration without foreign body of right thumb without damage to nail, initial encounter: Secondary | ICD-10-CM | POA: Diagnosis not present

## 2019-11-08 DIAGNOSIS — W540XXD Bitten by dog, subsequent encounter: Secondary | ICD-10-CM | POA: Diagnosis not present

## 2019-11-08 DIAGNOSIS — S61051D Open bite of right thumb without damage to nail, subsequent encounter: Secondary | ICD-10-CM

## 2019-11-08 DIAGNOSIS — Z4802 Encounter for removal of sutures: Secondary | ICD-10-CM

## 2019-11-08 DIAGNOSIS — S61051A Open bite of right thumb without damage to nail, initial encounter: Secondary | ICD-10-CM

## 2019-11-08 IMAGING — DX DG FINGER THUMB 2+V*R*
3 series · 3 of 3 positions shown · non-contrast
Comparison: None.

CLINICAL DATA: Laceration due to a dog bite 5 days ago

EXAM:
RIGHT THUMB 2+V

[finger ap]
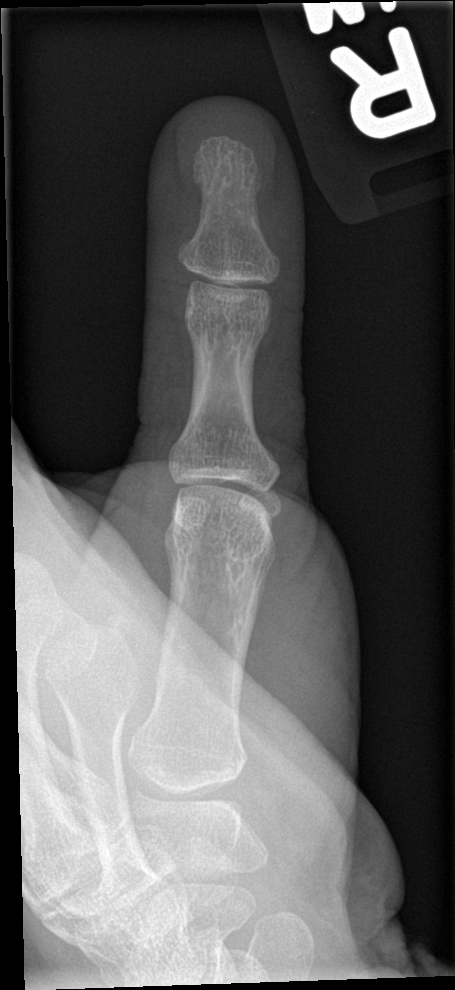

[finger obl]
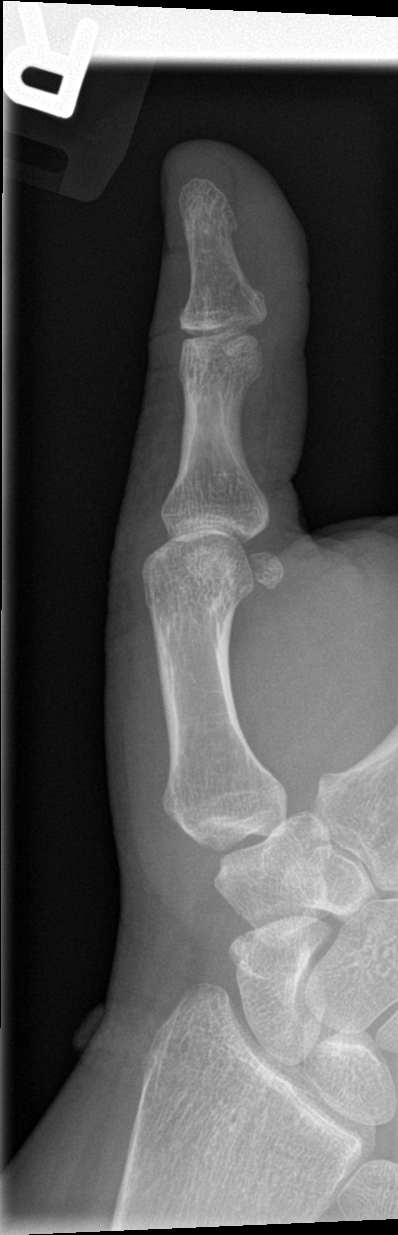

[finger lat]
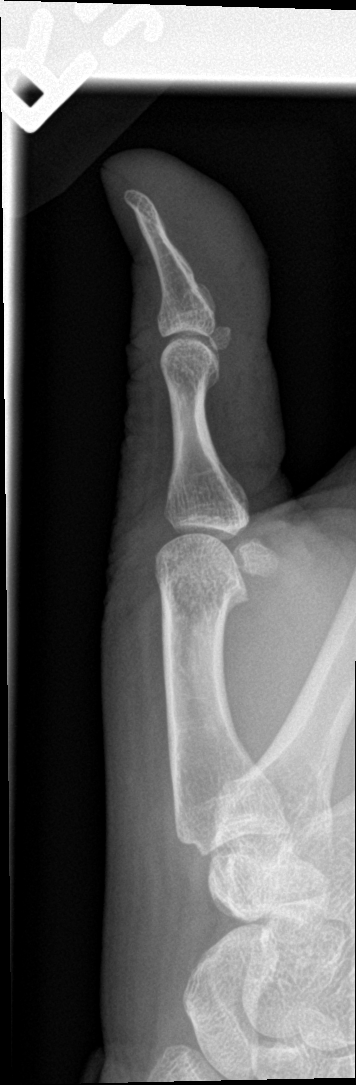

[3 of 3 positions shown; findings below may reference images not displayed]

FINDINGS: No fracture or bone lesion.  No evidence of osteomyelitis.

Joints are normally spaced and aligned.

Soft tissues are unremarkable.  No radiopaque foreign body.
IMPRESSION: Negative.

## 2019-11-08 NOTE — Discharge Instructions (Signed)
  You may take 500mg  acetaminophen every 4-6 hours as needed for pain.  You should change the bandage 1-2 times daily while the wound continues to heal over the next 1 week to help keep it protected and clean. If you are at home and will not be doing much with your hand, you can leave it uncovered.   Clean wound with warm water and mild soap, pat dry. Do not soak your thumb in a pool, hot tub, lake, ocean or dirty dish sink as these can increase risk of infection.  Be sure to complete the rest of your antibiotics  Follow up with primary care or return to urgent care if concerned for infection -increased pain, redness, swelling, drainage or pus or fever.

## 2019-11-08 NOTE — Telephone Encounter (Signed)
Patient called in this morning stating she was bit by a dog on Friday and ended up in the ED having to get stitches in her hand, she woke up this morning and states that she was in so much pain. Sent patient to [facility] for triage, the outcome was to be seen within 4 hours, after talking with [place] she suggested that patient go to ED due to not having any availably within our office.

## 2019-11-08 NOTE — ED Provider Notes (Signed)
[facility]    CSN: [id number] Arrival date & time: 11/08/19  1315      History   Chief Complaint Chief Complaint  Patient presents with  . Thumb Pain    HPI [name] [name] is a 34 y.o. female.   HPI  [name] [name] is a 34 y.o. female presenting to UC with c/o continued Right thumb pain after being bit by her aunt's dog, a pitbull mix, 5 days ago.  Pt states she was letting the dog sniff her as she had not met the dog before, and it bit her.  Pt initially went to [facility] in [facility] stating she had cut her finger on dirty glass because she was afraid the dog would be taken away. The dog is UTD on rabies. Pt is UTD on tetanus.  Five sutures were placed last week.  She was also started on amoxicillin. Pain is aching and sore, she is Left hand dominant. Denies redness, drainage or swelling to her thumb.  She cannot take NSAIDs due to having gastric bypass.   Past Medical History:  Diagnosis Date  . Anemia   . Anxiety   . Bipolar 2 disorder ([place]) 09/19/2019  . DEPRESSION   . Family history of colon cancer 2019/06/14   Father, 30s; deceased.   Marland Kitchen GERD (gastroesophageal reflux disease)   . Hypothyroidism    Hx of, normalized TSH during pregnancy 2011  . Infertility, female   . Migraines   . Morbid obesity ([place])    s/p RY 08/2012 - start weight 290 pounds  . Nephrolithiasis   . PCOS (polycystic ovarian syndrome)   . PONV (postoperative nausea and vomiting)   . Pregnancy induced hypertension     Patient Active Problem List   Diagnosis Date Noted  . Plantar fasciitis 10/31/2019  . Bipolar 2 disorder ([place]) 09/19/2019  . Fibromyalgia 09/09/2019  . Ischial bursitis of left side 07/19/2019  . Vitamin B12 deficiency 06/01/2019  . Vitamin D deficiency 06/01/2019  . Panic disorder 06/01/2019  . Family history of colon cancer June 14, 2019  . Chronic constipation Jun 14, 2019  . Major depression, recurrent, chronic ([place]) Jun 14, 2019  . 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 Pain in both lower extremities 11/21/2018  . Nocturnal leg cramps 10/26/2018  . Nephrolithiasis   . Reduced libido, due to Lexapro 07/09/2017  . Binge eating disorder 04/23/2017  . Morbid obesity ([place]) 04/23/2017  . Acquired iron deficiency anemia due to decreased absorption 02/22/2015  . 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 Barbie Banner OSTEOTOMY Left 06/24/2017   Procedure: [employer];  Surgeon: Trula Slade, DPM;  Location: [facility];  Service: Podiatry;  Laterality: Left;  . BUNIONECTOMY Left 06/24/2017   Procedure: Annye English;  Surgeon: Trula Slade, DPM;  Location: [facility];  Service: Podiatry;  Laterality: Left;  . CESAREAN SECTION     x 2  . CHOLECYSTECTOMY N/A 02/15/2019   Procedure: LAPAROSCOPIC CHOLECYSTECTOMY WITH INTRAOPERATIVE CHOLANGIOGRAM;  Surgeon: Ileana Roup, MD;  Location: [facility];  Service: General;  Laterality: N/A;  . FOOT SURGERY    . 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   Procedure: LAPAROSCOPIC ROUX-EN-Y GASTRIC;  Surgeon: Gayland Curry, MD;  Location: [facility];  Service: General;  Laterality: N/A;  laparoscopic roux-en-y gastric bypass  . LITHOTRIPSY Left   . TONSILLECTOMY    . TUBAL LIGATION    .  UPPER GI ENDOSCOPY  08/24/2012   Procedure: UPPER GI ENDOSCOPY;  Surgeon: Gayland Curry, MD;  Location: [facility];  Service: General;;  . WISDOM TOOTH EXTRACTION      OB History    Gravida  2   Para  2   Term  2   Preterm  0   AB  0   Living  2     SAB  0   TAB  0   Ectopic  0   Multiple  0   Live Births  2            Home Medications    Prior to Admission medications   Medication Sig Start Date End Date Taking? Authorizing Provider  amoxicillin (AMOXIL) 875 MG tablet Take 1 tablet (875 mg total) by mouth 2 (two) times daily. 11/04/19  Yes Norval Gable, MD  acetaminophen (TYLENOL) 500 MG  tablet Take 2 tablets (1,000 mg total) by mouth every 6 (six) hours as needed for moderate pain. 02/13/19   Domenic Moras, PA-C  busPIRone (BUSPAR) 7.5 MG tablet Take 7.5 mg by mouth 3 (three) times daily.  08/24/19   [provider]  CALCIUM-MAGNESIUM-ZINC PO Take 3 tablets by mouth daily.    [provider]  clonazePAM (KLONOPIN) 1 MG tablet Take 0.5-1 tablets (0.5-1 mg total) by mouth 2 (two) times daily as needed for anxiety. 09/22/19   Leamon Arnt, MD  cyanocobalamin (,VITAMIN B-12,) 1000 MCG/ML injection Inject 1 vial per week for 3 weeks, then 1 vial per month thereafter Patient taking differently: Inject 100 mcg into the skin once a week. Tuesdays 05/30/19   Leamon Arnt, MD  diclofenac Sodium (VOLTAREN) 1 % GEL Apply 4 g topically 4 (four) times daily. 05/13/19   Palumbo, April, MD  ferrous sulfate 325 (65 FE) MG tablet Take 325 mg by mouth 2 (two) times daily with a meal.     [provider]  HYDROcodone-acetaminophen (NORCO/VICODIN) 5-325 MG tablet 1-2 tabs po qd prn 11/04/19   Norval Gable, MD  hydrOXYzine (VISTARIL) 50 MG capsule Take 50 mg by mouth every 6 (six) hours as needed. 08/24/19   [provider]  lamoTRIgine (LAMICTAL) 100 MG tablet Take 100 mg by mouth daily. 08/24/19   [provider]  Menaquinone-7 (VITAMIN K2 PO) Take 1 capsule by mouth daily.     [provider]  Multiple Vitamins-Iron (ONE-TABLET-DAILY/IRON PO) Take 1 tablet by mouth daily.    [provider]  ondansetron (ZOFRAN ODT) 4 MG disintegrating tablet 86m ODT q4 hours prn nausea/vomit 10/13/19   FJacqlyn Larsen PA-C  pantoprazole (PROTONIX) 40 MG tablet Take 1 tablet (40 mg total) by mouth daily. 01/25/19   Esterwood, Amy S, PA-C  pregabalin (LYRICA) 75 MG capsule Take 1 capsule (75 mg total) by mouth 2 (two) times daily. 09/09/19   CGregor Hams MD  promethazine (PHENERGAN) 25 MG tablet Take 1 tablet (25 mg total) by mouth every 8 (eight) hours as  needed for nausea or vomiting. 09/15/19   McVey, EGelene Mink PA-C  QUEtiapine (SEROQUEL) 300 MG tablet Take 300 mg by mouth at bedtime.  08/24/19   [provider]  SYRINGE-NEEDLE, DISP, 3 ML 25G X 1" 3 ML MISC Use 1 needle per application once weekly 05/30/19   ALeamon Arnt MD  tizanidine (ZANAFLEX) 2 MG capsule Take 2 capsules (4 mg total) by mouth 3 (three) times daily. 09/28/19   CGregor Hams MD  valACYclovir (  VALTREX) 1000 MG tablet Take 1 tablet (1,000 mg total) by mouth 3 (three) times daily. Patient taking differently: Take 1,000 mg by mouth 3 (three) times daily. AS NEEDED FOR OUTBREAKS 05/25/19   Hassell Done, Mary-Margaret, FNP  Vitamin D, Ergocalciferol, (DRISDOL) 1.25 MG (50000 UT) CAPS capsule Take 50,000 Units by mouth once a week. Tuesday    [provider]    Family History Family History  Problem Relation Age of Onset  . Hyperlipidemia Father   . Hypertension Father   . Kidney disease Father   . Colon cancer Father 88  . Hypertension Mother   . Hypertension Maternal Grandmother   . Uterine cancer Maternal Grandmother 76  . Diabetes Maternal Grandfather     Social History Social History   Tobacco Use  . Smoking status: Never Smoker  . Smokeless tobacco: Never Used  Substance Use Topics  . Alcohol use: Yes    Comment: rare/socially  . Drug use: No     Allergies   Ambien [zolpidem] and Nsaids   Review of Systems Review of Systems  Constitutional: Negative for chills and fever.  Gastrointestinal: Negative for nausea and vomiting.  Musculoskeletal: Positive for arthralgias. Negative for joint swelling.  Skin: Positive for wound. Negative for color change.     Physical Exam Triage Vital Signs ED Triage Vitals  Enc Vitals Group     BP 11/08/19 1332 127/89     Pulse Rate 11/08/19 1332 98     Resp 11/08/19 1332 16     Temp 11/08/19 1332 98 F (36.7 C)     Temp Source 11/08/19 1332 Oral     SpO2 11/08/19 1332 98 %     Weight --       Height --      Head Circumference --      Peak Flow --      Pain Score 11/08/19 1330 7     Pain Loc --      Pain Edu? --      Excl. in [place]? --    No data found.  Updated Vital Signs BP 127/89 (BP Location: Left Wrist)   Pulse 98   Temp 98 F (36.7 C) (Oral)   Resp 16   SpO2 98%   Visual Acuity Right Eye Distance:   Left Eye Distance:   Bilateral Distance:    Right Eye Near:   Left Eye Near:    Bilateral Near:     Physical Exam Vitals and nursing note reviewed.  Constitutional:      Appearance: Normal appearance. She is well-developed.  HENT:     Head: Normocephalic and atraumatic.  Cardiovascular:     Rate and Rhythm: Normal rate and regular rhythm.     Pulses:          Radial pulses are 2+ on the right side.  Pulmonary:     Effort: Pulmonary effort is normal.  Musculoskeletal:        General: Tenderness present. No swelling. Normal range of motion.     Cervical back: Normal range of motion.     Comments: Right thumb: no edema, tenderness to distal aspect. Full ROM  Skin:    General: Skin is warm and dry.     Capillary Refill: Capillary refill takes less than 2 seconds.     Comments: Right thumb, volar aspect: well healing 2cm laceration, 5 sutures in place. Scant red blood and granulation tissue visible.   Neurological:     Mental Status: She  is alert and oriented to person, place, and time.     Sensory: No sensory deficit.  Psychiatric:        Behavior: Behavior normal.      UC Treatments / Results  Labs (all labs ordered are listed, but only abnormal results are displayed) Labs Reviewed - No data to display  EKG   Radiology DG Finger Thumb Right  Result Date: 11/08/2019 CLINICAL DATA:  Laceration due to a dog bite 5 days ago EXAM: RIGHT THUMB 2+V COMPARISON:  None. FINDINGS: No fracture or bone lesion.  No evidence of osteomyelitis. Joints are normally spaced and aligned. Soft tissues are unremarkable.  No radiopaque foreign body. IMPRESSION:  Negative. Electronically Signed   By: Lajean Manes M.D.   On: 11/08/2019 14:16    Procedures Procedures (including critical care time)  Medications Ordered in UC Medications - No data to display   Wound appears to be healing well. Cleaned with saline. Five interrupted prolene sutures removed w/o complication Bacitracin and bandage applied.    Initial Impression / Assessment and Plan / UC Course  I have reviewed the triage vital signs and the nursing notes.  Pertinent labs & imaging results that were available during my care of the patient were reviewed by me and considered in my medical decision making (see chart for details).    Imaging ordered to r/o underlying fracture due to large size of dog that bit pt No evidence of infection at this time Discussed suture removal now vs wait a few more days, due to MOI, safer to remove now to decrease risk of infection. Pt agreeable to have sutures removed now Five sutures removed w/o complication Encouraged to continue taking the amoxicillin as prescribed Encouraged close f/u as needed AVS provided  Final Clinical Impressions(s) / UC Diagnoses   Final diagnoses:  Laceration of skin of right thumb, initial encounter  Dog bite of right thumb, subsequent encounter  Pain of right thumb  Visit for suture removal     Discharge Instructions      You may take 563m acetaminophen every 4-6 hours as needed for pain.  You should change the bandage 1-2 times daily while the wound continues to heal over the next 1 week to help keep it protected and clean. If you are at home and will not be doing much with your hand, you can leave it uncovered.   Clean wound with warm water and mild soap, pat dry. Do not soak your thumb in a pool, hot tub, lake, ocean or dirty dish sink as these can increase risk of infection.  Be sure to complete the rest of your antibiotics  Follow up with primary care or return to urgent care if concerned for  infection -increased pain, redness, swelling, drainage or pus or fever.      ED Prescriptions    None     I have reviewed the PDMP during this encounter.   PNoe Gens PVermont05/26/21 1203

## 2019-11-08 NOTE — Telephone Encounter (Signed)
Noted! Thank you

## 2019-11-08 NOTE — ED Triage Notes (Signed)
Patient presents to Urgent Care with complaints of throbbing right thumb pain since she was bit by a dog five days ago. Patient reports she went to another urgent care and lied, told staff she was cut by glass so the dog would not get taken away. Pt states the dog it up to date on rabies. pt here today because she is concerned w/ the amount of pain she is having, does not think it is infected.

## 2019-11-15 ENCOUNTER — Encounter: Payer: Self-pay | Admitting: Family Medicine

## 2019-11-15 NOTE — Telephone Encounter (Signed)
Katie Can you please look into this and get it taken care of? I see that she sent a message. I forwarded it and brittany may have worked on it.  I still am unaware of what is needed?  Please help! Dr. Mardelle Matte

## 2019-11-15 NOTE — Telephone Encounter (Signed)
OK to write note

## 2019-11-16 NOTE — Telephone Encounter (Signed)
Letter sent out yesterday 11/15/2019. See communications

## 2019-11-16 NOTE — Telephone Encounter (Signed)
Thanks! Did you let her know you faxed it in for her?

## 2019-11-20 ENCOUNTER — Encounter (INDEPENDENT_AMBULATORY_CARE_PROVIDER_SITE_OTHER): Payer: Self-pay | Admitting: Family Medicine

## 2019-11-20 ENCOUNTER — Encounter: Payer: Self-pay | Admitting: Family Medicine

## 2019-11-21 NOTE — Telephone Encounter (Signed)
Please advise pt

## 2019-11-21 NOTE — Telephone Encounter (Signed)
Mammograms should start at age 34; no need for early mammogram due to family h/o "problem mammograms". No h/o Breast cancer or risk factors that would warrant earlier screening.  And I don't see when she was seen by hematology in the chart. Does she have more information? When? Who? We would check hgb andiron levels here in our office before placing a referral. Her ast cbc was normal in April.   Lab Results  Component Value Date   WBC 8.7 10/13/2019   HGB 13.0 10/13/2019   HCT 38.9 10/13/2019   MCV 84.9 10/13/2019   PLT 321 10/13/2019

## 2019-11-29 ENCOUNTER — Ambulatory Visit (INDEPENDENT_AMBULATORY_CARE_PROVIDER_SITE_OTHER): Payer: BC Managed Care – PPO | Admitting: Podiatry

## 2019-11-29 ENCOUNTER — Other Ambulatory Visit: Payer: Self-pay

## 2019-11-29 DIAGNOSIS — M722 Plantar fascial fibromatosis: Secondary | ICD-10-CM

## 2019-11-29 DIAGNOSIS — M779 Enthesopathy, unspecified: Secondary | ICD-10-CM

## 2019-11-29 DIAGNOSIS — M2142 Flat foot [pes planus] (acquired), left foot: Secondary | ICD-10-CM

## 2019-11-29 DIAGNOSIS — M2580 Other specified joint disorders, unspecified joint: Secondary | ICD-10-CM

## 2019-11-29 DIAGNOSIS — M2141 Flat foot [pes planus] (acquired), right foot: Secondary | ICD-10-CM

## 2019-11-29 NOTE — Progress Notes (Signed)
F/o; cast.  deep Offload 1st b/l w/  Ppt intrinsic and k-wedge extrinsic.

## 2019-12-05 NOTE — Progress Notes (Signed)
Subjective: 34 year old female presents the office today for follow-up evaluation of bilateral plantar fasciitis.  She states the heels are doing much better but the majority tenderness is on the right foot submetatarsal 1 and sesamoid complex.  No recent injury or trauma.  She is also requesting new orthotics today. Denies any systemic complaints such as fevers, chills, nausea, vomiting. No acute changes since last appointment, and no other complaints at this time.   Objective: AAO x3, NAD DP/PT pulses palpable bilaterally, CRT less than 3 seconds Flatfoot is evident.  There is tenderness on the sesamoid complex on the right foot there is no area of tenderness otherwise.  No pain of the heels today.  No pain with MPJ range of motion.  No edema, erythema. No pain with calf compression, swelling, warmth, erythema  Assessment: Capsulitis right foot; flatfoot deformity with resolved plantar fasciitis  Plan: -All treatment options discussed with the patient including all alternatives, risks, complications.  -Given injection form and this is my complex.  The skin with alcohol and mixture of 1 cc Kenalog 10, 0.5 cc of Marcaine plain, 0.5 cc of lidocaine plain was infiltrated on the sesamoid complex no complications.  Postinjection care discussed.  She was measured for new orthotics today with Raiford Noble. -Patient encouraged to call the office with any questions, concerns, change in symptoms.   [name] DPM

## 2019-12-07 ENCOUNTER — Ambulatory Visit (INDEPENDENT_AMBULATORY_CARE_PROVIDER_SITE_OTHER): Payer: BC Managed Care – PPO | Admitting: Family Medicine

## 2019-12-07 ENCOUNTER — Encounter (INDEPENDENT_AMBULATORY_CARE_PROVIDER_SITE_OTHER): Payer: Self-pay | Admitting: Family Medicine

## 2019-12-07 ENCOUNTER — Other Ambulatory Visit: Payer: Self-pay

## 2019-12-07 VITALS — BP 111/79 | HR 89 | Temp 98.0°F | Ht 64.0 in | Wt 270.0 lb

## 2019-12-07 DIAGNOSIS — R5383 Other fatigue: Secondary | ICD-10-CM | POA: Diagnosis not present

## 2019-12-07 DIAGNOSIS — R0602 Shortness of breath: Secondary | ICD-10-CM

## 2019-12-07 DIAGNOSIS — Z9189 Other specified personal risk factors, not elsewhere classified: Secondary | ICD-10-CM | POA: Diagnosis not present

## 2019-12-07 DIAGNOSIS — F419 Anxiety disorder, unspecified: Secondary | ICD-10-CM | POA: Diagnosis not present

## 2019-12-07 DIAGNOSIS — Z0289 Encounter for other administrative examinations: Secondary | ICD-10-CM

## 2019-12-07 DIAGNOSIS — E538 Deficiency of other specified B group vitamins: Secondary | ICD-10-CM

## 2019-12-07 DIAGNOSIS — E282 Polycystic ovarian syndrome: Secondary | ICD-10-CM

## 2019-12-07 DIAGNOSIS — Z6841 Body Mass Index (BMI) 40.0 and over, adult: Secondary | ICD-10-CM

## 2019-12-07 DIAGNOSIS — F329 Major depressive disorder, single episode, unspecified: Secondary | ICD-10-CM | POA: Diagnosis not present

## 2019-12-07 DIAGNOSIS — E559 Vitamin D deficiency, unspecified: Secondary | ICD-10-CM

## 2019-12-07 DIAGNOSIS — E7849 Other hyperlipidemia: Secondary | ICD-10-CM

## 2019-12-07 DIAGNOSIS — F32A Depression, unspecified: Secondary | ICD-10-CM

## 2019-12-08 LAB — LIPID PANEL WITH LDL/HDL RATIO
Cholesterol, Total: 213 mg/dL — ABNORMAL HIGH (ref 100–199)
HDL: 82 mg/dL (ref >39)
LDL Chol Calc (NIH): 111 mg/dL — ABNORMAL HIGH (ref 0–99)
LDL/HDL Ratio: 1.4 ratio (ref 0.0–3.2)
Triglycerides: 118 mg/dL (ref 0–149)
VLDL Cholesterol Cal: 20 mg/dL (ref 5–40)

## 2019-12-08 LAB — PREALBUMIN: PREALBUMIN: 22 mg/dL (ref 14–35)

## 2019-12-08 LAB — INSULIN, RANDOM: INSULIN: 7.1 u[IU]/mL (ref 2.6–24.9)

## 2019-12-08 LAB — HEMOGLOBIN A1C
Est. average glucose Bld gHb Est-mCnc: 94 mg/dL
Hgb A1c MFr Bld: 4.9 % (ref 4.8–5.6)

## 2019-12-08 LAB — TSH: TSH: 2.03 u[IU]/mL (ref 0.450–4.500)

## 2019-12-08 LAB — T3: T3, Total: 120 ng/dL (ref 71–180)

## 2019-12-08 LAB — T4: T4, Total: 6.3 ug/dL (ref 4.5–12.0)

## 2019-12-08 LAB — VITAMIN D 25 HYDROXY (VIT D DEFICIENCY, FRACTURES): Vit D, 25-Hydroxy: 26.3 ng/mL — ABNORMAL LOW (ref 30.0–100.0)

## 2019-12-08 LAB — FOLATE: Folate: 10.4 ng/mL (ref >3.0)

## 2019-12-08 LAB — VITAMIN B12: Vitamin B-12: 1521 pg/mL — ABNORMAL HIGH (ref 232–1245)

## 2019-12-09 ENCOUNTER — Encounter (INDEPENDENT_AMBULATORY_CARE_PROVIDER_SITE_OTHER): Payer: Self-pay | Admitting: Family Medicine

## 2019-12-12 NOTE — Progress Notes (Signed)
Dear Dr. Redmond Pulling,   Thank you for referring [name] [name] to our clinic. The following note includes my evaluation and treatment recommendations.  Chief Complaint:   OBESITY [name] [name] (MR# [id number]) is a 34 y.o. female who presents for evaluation and treatment of obesity and related comorbidities. Current BMI is Body mass index is 46.35 kg/m. [name] [name] has been struggling with her weight for many years and has been unsuccessful in either losing weight, maintaining weight loss, or reaching her healthy weight goal.  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 She may have iced coffee with little creamer, a handful of cereal (froated mini wheats), or a bag of trail mix (feels full). For lunch she is doing 4 slices of [place] with mayo and mustard sandwich with crackers or chips (feels full). For snack she is doing Chewy granola bars (3-4 pm). For dinner she is doing protein and veggies (goal to get 60 grams of protein per day), or spaghetti (1 bowl).  [name] [name] is currently in the action stage of change and ready to dedicate time achieving and maintaining a healthier weight. [name] [name] is interested in becoming our patient and working on intensive lifestyle modifications including (but not limited to) diet and exercise for weight loss.  [name] [name]'s habits were reviewed today and are as follows: Her family eats meals together, she thinks her family will eat healthier with her, her desired weight loss is 45-70 lbs, she has been heavy most of her life, she started gaining weight in February 2021, her heaviest weight ever was 300 pounds, she has significant food cravings issues, she snacks frequently in the evenings, she skips meals frequently, she is frequently drinking liquids with calories, she frequently makes poor food choices, she has problems with excessive hunger, she has binge eating behaviors and she struggles with emotional eating.  Depression Screen [name] [name]'s Food and  Mood (modified PHQ-9) score was 22.  Depression screen [facility] 2/9 12/07/2019  Decreased Interest 3  Down, Depressed, Hopeless 3  PHQ - 2 Score 6  Altered sleeping 1  Tired, decreased energy 3  Change in appetite 3  Feeling bad or failure about yourself  3  Trouble concentrating 3  Moving slowly or fidgety/restless 1  Suicidal thoughts 2  PHQ-9 Score 22  Difficult doing work/chores Extremely dIfficult  Some recent data might be hidden   Subjective:   1. Other fatigue [name] [name] admits to daytime somnolence and admits to waking up still tired. Patent has a history of symptoms of daytime fatigue. [name] [name] generally gets 8 or 9 hours of sleep per night, and states that she has generally restful sleep. Snoring is present. Apneic episodes are not present. Epworth Sleepiness Score is 12. EKG-normal sinus rhythm at 92 BPM.  2. SOB (shortness of breath) on exertion [name] [name] notes increasing shortness of breath with exercising and seems to be worsening over time with weight gain. She notes getting out of breath sooner with activity than she used to. This has not gotten worse recently. [name] [name] denies shortness of breath at rest or orthopnea.  3. Vitamin D deficiency [name] [name] denies nausea, vomiting, or muscle weakness, but she notes fatigue.  4. B12 deficiency [name] [name] is on B12 injections, and she has a history of gastric bypass.  5. PCOS (polycystic ovarian syndrome) [name] [name] is on metformin, and her last A1c was 4.9.  6. Other hyperlipidemia [name] [name] has a history of elevated cholesterol and she is not on medications.  7. Anxiety and depression [name] [name] denies  suicidal ideas or homicidal ideas.  8. At risk for osteoporosis [name] [name] is at higher risk of osteopenia and osteoporosis due to Vitamin D deficiency.   Assessment/Plan:   1. Other fatigue [name] [name] does feel that her weight is causing her energy to be lower than it should be. Fatigue may be related to obesity, depression or  many other causes. Labs will be ordered, and in the meanwhile, [name] [name] will focus on self care including making healthy food choices, increasing physical activity and focusing on stress reduction.  - EKG 12-Lead - Folate - T3 - T4 - TSH - Prealbumin  2. SOB (shortness of breath) on exertion [name] [name] does feel that she gets out of breath more easily that she used to when she exercises. [name] [name]'s shortness of breath appears to be obesity related and exercise induced. She has agreed to work on weight loss and gradually increase exercise to treat her exercise induced shortness of breath. Will continue to monitor closely.  - Folate - T3 - T4 - TSH - Prealbumin  3. Vitamin D deficiency Low Vitamin D level contributes to fatigue and are associated with obesity, breast, and colon cancer. We will check labs today. [name] [name] will follow-up for routine testing of Vitamin D, at least 2-3 times per year to avoid over-replacement.  - VITAMIN D 25 Hydroxy (Vit-D Deficiency, Fractures)  4. B12 deficiency The diagnosis was reviewed with the patient. Counseling provided today, see below. We will check labs today, and we will continue to monitor. Orders and follow up as documented in patient record.  Counseling . The body needs vitamin B12: to make red blood cells; to make DNA; and to help the nerves work properly so they can carry messages from the brain to the body.  . The main causes of vitamin B12 deficiency include dietary deficiency, digestive diseases, pernicious anemia, and having a surgery in which part of the stomach or small intestine is removed.  . Certain medicines can make it harder for the body to absorb vitamin B12. These medicines include: heartburn medications; some antibiotics; some medications used to treat diabetes, gout, and high cholesterol.  . In some cases, there are no symptoms of this condition. If the condition leads to anemia or nerve damage, various symptoms can occur, such  as weakness or fatigue, shortness of breath, and numbness or tingling in your hands and feet.   . Treatment:  o May include taking vitamin B12 supplements.  o Avoid alcohol.  o Eat lots of healthy foods that contain vitamin B12: - Beef, pork, chicken, [place], and organ meats, such as liver.  - Seafood: This includes clams, rainbow trout, salmon, tuna, and haddock. Eggs.  - Cereal and dairy products that are fortified: This means that vitamin B12 has been added to the food.   - Vitamin B12 - Folate  5. PCOS (polycystic ovarian syndrome) Intensive lifestyle modifications are first line treatment for this issue. We discussed several lifestyle modifications today and [name] [name] will continue to work on diet, exercise and weight loss efforts. We will check labs today. Orders and follow up as documented in patient record.  Counseling . PCOS is a leading cause of menstrual irregularities and infertility. It is also associated with obesity, hirsutism (excessive hair growth on the face, chest, or back), and cardiovascular risk factors such as high cholesterol and insulin resistance. . Insulin resistance appears to play a central role.  . Women with PCOS have been shown to have impaired appetite-regulating hormones. . Metformin is one medication  that can improve metabolic parameters.  . Women with polycystic ovary syndrome (PCOS) have an increased risk for cardiovascular disease (CVD) - European Journal of Preventive Cardiology.  - Hemoglobin A1c - Insulin, random - Prealbumin  6. Other hyperlipidemia Cardiovascular risk and specific lipid/LDL goals reviewed. We discussed several lifestyle modifications today and [name] [name] will continue to work on diet, exercise and weight loss efforts. We will check labs today. Orders and follow up as documented in patient record.   Counseling Intensive lifestyle modifications are the first line treatment for this issue. . Dietary changes: Increase soluble fiber.  Decrease simple carbohydrates. . Exercise changes: Moderate to vigorous-intensity aerobic activity 150 minutes per week if tolerated. . Lipid-lowering medications: see documented in medical record.  - Lipid Panel With LDL/HDL Ratio  7. Anxiety and depression Behavior modification techniques were discussed today to help [name] [name] deal with her anxiety. We will follow up at her next appointment. Orders and follow up as documented in patient record.   8. At risk for osteoporosis [name] [name] was given approximately 15 minutes of osteoporosis prevention counseling today. [name] [name] is at risk for osteopenia and osteoporosis due to her Vitamin D deficiency. She was encouraged to take her Vitamin D and follow her higher calcium diet and increase strengthening exercise to help strengthen her bones and decrease her risk of osteopenia and osteoporosis.  Repetitive spaced learning was employed today to elicit superior memory formation and behavioral change.  9. Class 3 severe obesity with serious comorbidity and body mass index (BMI) of 45.0 to 49.9 in adult, unspecified obesity type (HCC) Donielle is currently in the action stage of change and her goal is to continue with weight loss efforts. I recommend [name] [name] begin the structured treatment plan as follows:  She has agreed to the Category 3 Plan with 8 oz of meat at dinner, with limitations at meals if she cant get food in.  Exercise goals: No exercise has been prescribed at this time.   Behavioral modification strategies: increasing lean protein intake, increasing vegetables, decreasing eating out, meal planning and cooking strategies and keeping healthy foods in the home.  She was informed of the importance of frequent follow-up visits to maximize her success with intensive lifestyle modifications for her multiple health conditions. She was informed we would discuss her lab results at her next visit unless there is a critical issue that needs to be  addressed sooner. [name] [name] agreed to keep her next visit at the agreed upon time to discuss these results.  Objective:   Blood pressure 111/79, pulse 89, temperature 98 F (36.7 C), temperature source Oral, height 5\' 4"  (1.626 m), weight 270 lb (122.5 kg), SpO2 98 %. Body mass index is 46.35 kg/m.  EKG: Normal sinus rhythm, rate 92 BPM.  Indirect Calorimeter completed today shows a VO2 of 284 and a REE of 1974.  Her calculated basal metabolic rate is thus her basal metabolic rate is better than expected.  General: Cooperative, alert, well developed, in no acute distress. HEENT: Conjunctivae and lids unremarkable. Cardiovascular: Regular rhythm.  Lungs: Normal work of breathing. Neurologic: No focal deficits.   Lab Results  Component Value Date   CREATININE 0.60 10/13/2019   BUN 13 10/13/2019   NA 137 10/13/2019   K 3.8 10/13/2019   CL 103 10/13/2019   CO2 24 10/13/2019   Lab Results  Component Value Date   ALT 52 (H) 06/17/2019   AST 22 06/17/2019   ALKPHOS 165 (H) 06/17/2019   BILITOT 0.4 06/17/2019  Lab Results  Component Value Date   HGBA1C 4.9 12/07/2019   HGBA1C 4.9 01/12/2018   HGBA1C 4.8 05/31/2017   Lab Results  Component Value Date   INSULIN 7.1 12/07/2019   Lab Results  Component Value Date   TSH 2.030 12/07/2019   Lab Results  Component Value Date   CHOL 213 (H) 12/07/2019   HDL 82 12/07/2019   LDLCALC 111 (H) 12/07/2019   LDLDIRECT 140.3 04/03/2010   TRIG 118 12/07/2019   CHOLHDL 3 01/12/2018   Lab Results  Component Value Date   WBC 8.7 10/13/2019   HGB 13.0 10/13/2019   HCT 38.9 10/13/2019   MCV 84.9 10/13/2019   PLT 321 10/13/2019   Lab Results  Component Value Date   IRON 26 (L) 07/12/2019   TIBC 360 07/12/2019   FERRITIN 14 (L) 07/12/2019   Attestation Statements:   This is the patient's first visit at [facility]. The patient's NEW PATIENT PACKET was reviewed at length. Included in the packet: current and  past health history, medications, allergies, ROS, gynecologic history (women only), surgical history, family history, social history, weight history, weight loss surgery history (for those that have had weight loss surgery), nutritional evaluation, mood and food questionnaire, PHQ9, Epworth questionnaire, sleep habits questionnaire, patient life and health improvement goals questionnaire. These will all be scanned into the patient's chart under media.   During the visit, I independently reviewed the patient's EKG, bioimpedance scale results, and indirect calorimeter results. I used this information to tailor a meal plan for the patient that will help her to lose weight and will improve her obesity-related conditions going forward. I performed a medically necessary appropriate examination and/or evaluation. I discussed the assessment and treatment plan with the patient. The patient was provided an opportunity to ask questions and all were answered. The patient agreed with the plan and demonstrated an understanding of the instructions. Labs were ordered at this visit and will be reviewed at the next visit unless more critical results need to be addressed immediately. Clinical information was updated and documented in the EMR.   Time spent on visit including pre-visit chart review and post-visit care was 45 minutes.   A separate 15 minutes was spent on risk counseling (see above).    I, Burt Knack, am acting as transcriptionist for Reuben Likes, MD.  I have reviewed the above documentation for accuracy and completeness, and I agree with the above. - Katherina Mires, MD

## 2019-12-12 NOTE — Telephone Encounter (Signed)
Please advise 

## 2019-12-20 ENCOUNTER — Ambulatory Visit (INDEPENDENT_AMBULATORY_CARE_PROVIDER_SITE_OTHER): Payer: BC Managed Care – PPO | Admitting: Psychology

## 2019-12-20 ENCOUNTER — Other Ambulatory Visit: Payer: Self-pay

## 2019-12-20 ENCOUNTER — Ambulatory Visit: Payer: BC Managed Care – PPO | Admitting: Orthotics

## 2019-12-20 DIAGNOSIS — M2141 Flat foot [pes planus] (acquired), right foot: Secondary | ICD-10-CM

## 2019-12-20 DIAGNOSIS — M2580 Other specified joint disorders, unspecified joint: Secondary | ICD-10-CM

## 2019-12-20 DIAGNOSIS — F4322 Adjustment disorder with anxiety: Secondary | ICD-10-CM

## 2019-12-20 DIAGNOSIS — M2142 Flat foot [pes planus] (acquired), left foot: Secondary | ICD-10-CM

## 2019-12-20 DIAGNOSIS — F5101 Primary insomnia: Secondary | ICD-10-CM

## 2019-12-20 DIAGNOSIS — M722 Plantar fascial fibromatosis: Secondary | ICD-10-CM

## 2019-12-20 NOTE — Progress Notes (Signed)
Patient came in today to pick up custom made foot orthotics.  The goals were accomplished and the patient reported no dissatisfaction with said orthotics.  Patient was advised of breakin period and how to report any issues. 

## 2019-12-21 ENCOUNTER — Encounter (INDEPENDENT_AMBULATORY_CARE_PROVIDER_SITE_OTHER): Payer: Self-pay | Admitting: Family Medicine

## 2019-12-21 ENCOUNTER — Ambulatory Visit (INDEPENDENT_AMBULATORY_CARE_PROVIDER_SITE_OTHER): Payer: BC Managed Care – PPO | Admitting: Family Medicine

## 2019-12-21 VITALS — BP 112/81 | HR 97 | Temp 97.9°F | Ht 64.0 in | Wt 266.0 lb

## 2019-12-21 DIAGNOSIS — E7849 Other hyperlipidemia: Secondary | ICD-10-CM | POA: Diagnosis not present

## 2019-12-21 DIAGNOSIS — E8881 Metabolic syndrome: Secondary | ICD-10-CM

## 2019-12-21 DIAGNOSIS — Z6841 Body Mass Index (BMI) 40.0 and over, adult: Secondary | ICD-10-CM

## 2019-12-21 DIAGNOSIS — E559 Vitamin D deficiency, unspecified: Secondary | ICD-10-CM

## 2019-12-22 NOTE — Progress Notes (Signed)
Chief Complaint:   OBESITY [name] [name] is here to discuss her progress with her obesity treatment plan along with follow-up of her obesity related diagnoses. [name] [name] is on the Category 3 Plan + 8 oz of meat and states she is following her eating plan approximately 75% of the time. [name] [name] states she is walking 7,500 steps 3 times per week.  Today's visit was #: 2 Starting weight: 270 lbs Starting date: 12/07/2019 Today's weight: 266 lbs Today's date: 12/21/2019 Total lbs lost to date: 4 Total lbs lost since last in-office visit: 4  Interim History: [name] [name]'s first 2 days she felt she was going to be sick from the quantity of food. After that she felt hungry. She is doing laughing cow, [place] bars, and crackers for snacks. Lunch was divided between options, and dinner was 8 oz of protein.  Subjective:   1. Other hyperlipidemia [name] [name]'s last LDL was 111, HDL 82, and triglycerides 426. She is not on statin. I discussed labs with the patient today.  2. Vitamin D deficiency [name] [name]'s Vit D level was 26.3. She is on 50,000 IU weekly. I discussed labs with the patient today.  3. Insulin resistance [name] [name]'s last A1c was 4.9 and insulin 7.1. She is on metformin 500 mg BID. I discussed labs with the patient today.  Assessment/Plan:   1. Other hyperlipidemia Cardiovascular risk and specific lipid/LDL goals reviewed. We discussed several lifestyle modifications today and [name] [name] will continue to work on diet, exercise and weight loss efforts. We will follow up on labs in 3 months. Orders and follow up as documented in patient record.   Counseling Intensive lifestyle modifications are the first line treatment for this issue. . Dietary changes: Increase soluble fiber. Decrease simple carbohydrates. . Exercise changes: Moderate to vigorous-intensity aerobic activity 150 minutes per week if tolerated. . Lipid-lowering medications: see documented in medical record.  2. Vitamin D  deficiency Low Vitamin D level contributes to fatigue and are associated with obesity, breast, and colon cancer. [name] [name] agreed to continue taking prescription Vitamin D 50,000 IU every week and will follow-up for routine testing of Vitamin D, at least 2-3 times per year to avoid over-replacement.  3. Insulin resistance [name] [name] will continue to work on weight loss, exercise, and decreasing simple carbohydrates to help decrease the risk of diabetes. We will repeat labs in 3 months. [name] [name] agreed to follow-up with [place] as directed to closely monitor her progress.  4. Class 3 severe obesity with serious comorbidity and body mass index (BMI) of 45.0 to 49.9 in adult, unspecified obesity type (HCC) [name] [name] is currently in the action stage of change. As such, her goal is to continue with weight loss efforts. She has agreed to the Category 3 Plan.   Exercise goals: As is.  Behavioral modification strategies: increasing lean protein intake, increasing vegetables, meal planning and cooking strategies, keeping healthy foods in the home and planning for success.  [name] [name] has agreed to follow-up with our clinic in 2 weeks. She was informed of the importance of frequent follow-up visits to maximize her success with intensive lifestyle modifications for her multiple health conditions.   Objective:   Blood pressure 112/81, pulse 97, temperature 97.9 F (36.6 C), temperature source Oral, height 5\' 4"  (1.626 m), weight 266 lb (120.7 kg), SpO2 100 %. Body mass index is 45.66 kg/m.  General: Cooperative, alert, well developed, in no acute distress. HEENT: Conjunctivae and lids unremarkable. Cardiovascular: Regular rhythm.  Lungs: Normal work of breathing. Neurologic: No focal deficits.  Lab Results  Component Value Date   CREATININE 0.60 10/13/2019   BUN 13 10/13/2019   NA 137 10/13/2019   K 3.8 10/13/2019   CL 103 10/13/2019   CO2 24 10/13/2019   Lab Results  Component Value Date   ALT 52  (H) 06/17/2019   AST 22 06/17/2019   ALKPHOS 165 (H) 06/17/2019   BILITOT 0.4 06/17/2019   Lab Results  Component Value Date   HGBA1C 4.9 12/07/2019   HGBA1C 4.9 01/12/2018   HGBA1C 4.8 05/31/2017   Lab Results  Component Value Date   INSULIN 7.1 12/07/2019   Lab Results  Component Value Date   TSH 2.030 12/07/2019   Lab Results  Component Value Date   CHOL 213 (H) 12/07/2019   HDL 82 12/07/2019   LDLCALC 111 (H) 12/07/2019   LDLDIRECT 140.3 04/03/2010   TRIG 118 12/07/2019   CHOLHDL 3 01/12/2018   Lab Results  Component Value Date   WBC 8.7 10/13/2019   HGB 13.0 10/13/2019   HCT 38.9 10/13/2019   MCV 84.9 10/13/2019   PLT 321 10/13/2019   Lab Results  Component Value Date   IRON 26 (L) 07/12/2019   TIBC 360 07/12/2019   FERRITIN 14 (L) 07/12/2019   Attestation Statements:   Reviewed by clinician on day of visit: allergies, medications, problem list, medical history, surgical history, family history, social history, and previous encounter notes.  Time spent on visit including pre-visit chart review and post-visit care and charting was 25 minutes.    I, Burt Knack, am acting as transcriptionist for Reuben Likes, MD.  I have reviewed the above documentation for accuracy and completeness, and I agree with the above. - Katherina Mires, MD

## 2019-12-28 DIAGNOSIS — F319 Bipolar disorder, unspecified: Secondary | ICD-10-CM | POA: Diagnosis not present

## 2019-12-28 DIAGNOSIS — Z9884 Bariatric surgery status: Secondary | ICD-10-CM | POA: Diagnosis not present

## 2020-01-02 ENCOUNTER — Emergency Department: Admission: EM | Admit: 2020-01-02 | Discharge status: Absent without leave | Payer: Self-pay

## 2020-01-02 DIAGNOSIS — Z20822 Contact with and (suspected) exposure to covid-19: Secondary | ICD-10-CM | POA: Diagnosis not present

## 2020-01-02 DIAGNOSIS — F419 Anxiety disorder, unspecified: Secondary | ICD-10-CM | POA: Diagnosis not present

## 2020-01-02 DIAGNOSIS — R05 Cough: Secondary | ICD-10-CM | POA: Diagnosis not present

## 2020-01-02 DIAGNOSIS — F411 Generalized anxiety disorder: Secondary | ICD-10-CM | POA: Diagnosis not present

## 2020-01-02 DIAGNOSIS — R0789 Other chest pain: Secondary | ICD-10-CM | POA: Diagnosis not present

## 2020-01-02 DIAGNOSIS — Z79899 Other long term (current) drug therapy: Secondary | ICD-10-CM | POA: Diagnosis not present

## 2020-01-02 DIAGNOSIS — R079 Chest pain, unspecified: Secondary | ICD-10-CM | POA: Diagnosis not present

## 2020-01-02 DIAGNOSIS — R0602 Shortness of breath: Secondary | ICD-10-CM | POA: Diagnosis not present

## 2020-01-02 DIAGNOSIS — Z9884 Bariatric surgery status: Secondary | ICD-10-CM | POA: Diagnosis not present

## 2020-01-02 DIAGNOSIS — Z7984 Long term (current) use of oral hypoglycemic drugs: Secondary | ICD-10-CM | POA: Diagnosis not present

## 2020-01-02 DIAGNOSIS — R450 Nervousness: Secondary | ICD-10-CM | POA: Diagnosis not present

## 2020-01-03 ENCOUNTER — Encounter: Payer: Self-pay | Admitting: Physician Assistant

## 2020-01-03 ENCOUNTER — Telehealth (INDEPENDENT_AMBULATORY_CARE_PROVIDER_SITE_OTHER): Payer: Self-pay | Admitting: Physician Assistant

## 2020-01-03 ENCOUNTER — Other Ambulatory Visit: Payer: Self-pay

## 2020-01-03 VITALS — Ht 64.0 in | Wt 266.0 lb

## 2020-01-03 DIAGNOSIS — R059 Cough, unspecified: Secondary | ICD-10-CM

## 2020-01-03 DIAGNOSIS — R05 Cough: Secondary | ICD-10-CM

## 2020-01-03 MED ORDER — HYDROCOD POLST-CPM POLST ER 10-8 MG/5ML PO SUER: 5.0000 mL | Freq: Every evening | ORAL | 0 refills | Status: DC | PRN

## 2020-01-03 NOTE — Progress Notes (Signed)
Virtual Visit via Video   I connected with [name] [name] on 01/03/20 at 12:00 PM EDT by a video enabled telemedicine application and verified that I am speaking with the correct person using two identifiers. Location patient: Home Location provider: [facility], Office Persons participating in the virtual visit: [name] [name], [name] PA-C, Corky Mull, LPN   I discussed the limitations of evaluation and management by telemedicine and the availability of in person appointments. The patient expressed understanding and agreed to proceed.  I acted as a Neurosurgeon for [employer], [employer], LPN   Subjective:   HPI:   Cough Pt c/o cough x 4 days, was seen at [facility] ER yesterday.  Patient had x-ray and labs which were all essentially normal, including negative D-dimer, normal white count and negative troponins.  She was given Jerilynn Som for cough but did not pick these up because he is historically have not worked well for her.  Pt was given Tessalon capsules for cough but not helping. Pt says she is coughing and expectorating green sputum. Denies fever or chills. COVID test was Negative.  Denies: Chest pain, shortness of breath, malaise, fever.  She is also taking Delsym over-the-counter with mild relief.    ROS: See pertinent positives and negatives per HPI.  Patient Active Problem List   Diagnosis Date Noted  . Plantar fasciitis 10/31/2019  . Bipolar 2 disorder (HCC) 09/19/2019  . Fibromyalgia 09/09/2019  . Ischial bursitis of left side 07/19/2019  . Vitamin B12 deficiency 06/01/2019  . Vitamin D deficiency 06/01/2019  . Panic disorder 06/01/2019  . Family history of colon cancer 05/16/2019  . Chronic constipation 05/16/2019  . Major depression, recurrent, chronic (HCC) 05/16/2019  . Insomnia due to other mental disorder 03/13/2019  . Pain in both lower extremities 11/21/2018  . Nocturnal leg cramps 10/26/2018  . Nephrolithiasis   .  Reduced libido, due to Lexapro 07/09/2017  . Binge eating disorder 04/23/2017  . Morbid obesity (HCC) 04/23/2017  . Acquired iron deficiency anemia due to decreased absorption 02/22/2015  . 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 Smoking status: Never Smoker  . Smokeless tobacco: Never Used  Substance Use Topics  . Alcohol use: Yes    Comment: rare/socially    Current Outpatient Medications:  .  acetaminophen (TYLENOL) 500 MG tablet, Take 2 tablets (1,000 mg total) by mouth every 6 (six) hours as needed for moderate pain., Disp: 30 tablet, Rfl: 0 .  busPIRone (BUSPAR) 15 MG tablet, Take 15 mg by mouth 3 (three) times daily., Disp: , Rfl:  .  clonazePAM (KLONOPIN) 1 MG tablet, Take 0.5-1 tablets (0.5-1 mg total) by mouth 2 (two) times daily as needed for anxiety., Disp: 60 tablet, Rfl: 5 .  cyanocobalamin (,VITAMIN B-12,) 1000 MCG/ML injection, Inject 1 vial per week for 3 weeks, then 1 vial per month thereafter (Patient taking differently: Inject 100 mcg into the skin once a week. Tuesdays), Disp: 6 mL, Rfl: 3 .  diclofenac Sodium (VOLTAREN) 1 % GEL, Apply 4 g topically 4 (four) times daily., Disp: 100 g, Rfl: 0 .  FIBER ADULT GUMMIES PO, Take by mouth., Disp: , Rfl:  .  hydrOXYzine (VISTARIL) 50 MG capsule, Take 50 mg by mouth every 6 (six) hours as needed., Disp: , Rfl:  .  lamoTRIgine (LAMICTAL) 200 MG tablet, Take 200 mg by mouth daily., Disp: , Rfl:  .  Magnesium 250 MG  TABS, Take by mouth., Disp: , Rfl:  .  metFORMIN (GLUCOPHAGE) 500 MG tablet, Take 500 mg by mouth 2 (two) times daily., Disp: , Rfl:  .  Multiple Vitamins-Iron (ONE-TABLET-DAILY/IRON PO), Take 1 tablet by mouth daily., Disp: , Rfl:  .  ondansetron (ZOFRAN ODT) 4 MG disintegrating tablet, 4mg  ODT q4 hours prn nausea/vomit, Disp: 10 tablet, Rfl: 0 .  pregabalin (LYRICA) 75 MG capsule, Take 1 capsule (75 mg total) by mouth 2 (two) times daily., Disp: 60 capsule, Rfl: 3 .   QUEtiapine (SEROQUEL) 400 MG tablet, Take 400 mg by mouth at bedtime. , Disp: , Rfl:  .  SYRINGE-NEEDLE, DISP, 3 ML 25G X 1" 3 ML MISC, Use 1 needle per application once weekly, Disp: 12 each, Rfl: 0 .  tizanidine (ZANAFLEX) 2 MG capsule, Take 2 capsules (4 mg total) by mouth 3 (three) times daily., Disp: 90 capsule, Rfl: 1 .  valACYclovir (VALTREX) 1000 MG tablet, Take 1 tablet (1,000 mg total) by mouth 3 (three) times daily., Disp: 21 tablet, Rfl: 0 .  Vitamin D, Ergocalciferol, (DRISDOL) 1.25 MG (50000 UT) CAPS capsule, Take 50,000 Units by mouth once a week. Tuesday, Disp: , Rfl:  .  chlorpheniramine-HYDROcodone (TUSSIONEX PENNKINETIC ER) 10-8 MG/5ML SUER, Take 5 mLs by mouth at bedtime as needed for cough., Disp: 70 mL, Rfl: 0  Allergies  Allergen Reactions  . Ambien [Zolpidem] Other (See Comments)    Sleep walking   . Nsaids     Gastric bypass    Objective:   VITALS: Per patient if applicable, see vitals. GENERAL: Alert, appears well and in no acute distress. HEENT: Atraumatic, conjunctiva clear, no obvious abnormalities on inspection of external nose and ears. NECK: Normal movements of the head and neck. CARDIOPULMONARY: No increased WOB. Speaking in clear sentences. I:E ratio WNL.  MS: Moves all visible extremities without noticeable abnormality. PSYCH: Pleasant and cooperative, well-groomed. Speech normal rate and rhythm. Affect is appropriate. Insight and judgement are appropriate. Attention is focused, linear, and appropriate.  NEURO: CN grossly intact. Oriented as arrived to appointment on time with no prompting. Moves both UE equally.  SKIN: No obvious lesions, wounds, erythema, or cyanosis noted on face or hands.  Assessment and Plan:   [name] was seen today for cough.  Diagnoses and all orders for this visit:  Cough  Other orders -     chlorpheniramine-HYDROcodone (TUSSIONEX PENNKINETIC ER) 10-8 MG/5ML SUER; Take 5 mLs by mouth at bedtime as needed for  cough.   Full work-up yesterday in the emergency room.  No red flags on exam today.  I offered oral prednisone but she declined.  I also offered inhalers but she also declined this as well.  I have offered small prescription of Tussionex to use to help her get some rest and help with cough.  She is agreeable to this.  She understands that this is a controlled substance and not to mix with other controlled substances.  Encourage close follow-up if symptoms persist.  . Reviewed expectations re: course of current medical issues. . Discussed self-management of symptoms. . Outlined signs and symptoms indicating need for more acute intervention. . Patient verbalized understanding and all questions were answered. Lelon Mast Health Maintenance issues including appropriate healthy diet, exercise, and smoking avoidance were discussed with patient. . See orders for this visit as documented in the electronic medical record.  I discussed the assessment and treatment plan with the patient. The patient was provided an opportunity to ask questions and all were  answered. The patient agreed with the plan and demonstrated an understanding of the instructions.   The patient was advised to call back or seek an in-person evaluation if the symptoms worsen or if the condition fails to improve as anticipated.   CMA or LPN served as scribe during this visit. History, Physical, and Plan performed by medical provider. The above documentation has been reviewed and is accurate and complete.   [place], [place] 01/03/2020

## 2020-01-05 ENCOUNTER — Ambulatory Visit (INDEPENDENT_AMBULATORY_CARE_PROVIDER_SITE_OTHER): Payer: BC Managed Care – PPO | Admitting: Family Medicine

## 2020-01-05 ENCOUNTER — Other Ambulatory Visit: Payer: Self-pay

## 2020-01-05 ENCOUNTER — Encounter (INDEPENDENT_AMBULATORY_CARE_PROVIDER_SITE_OTHER): Payer: Self-pay | Admitting: Family Medicine

## 2020-01-05 VITALS — BP 103/73 | HR 97 | Temp 98.3°F | Ht 64.0 in | Wt 268.0 lb

## 2020-01-05 DIAGNOSIS — E559 Vitamin D deficiency, unspecified: Secondary | ICD-10-CM | POA: Diagnosis not present

## 2020-01-05 DIAGNOSIS — F319 Bipolar disorder, unspecified: Secondary | ICD-10-CM | POA: Diagnosis not present

## 2020-01-05 DIAGNOSIS — Z6841 Body Mass Index (BMI) 40.0 and over, adult: Secondary | ICD-10-CM | POA: Diagnosis not present

## 2020-01-07 ENCOUNTER — Encounter: Payer: Self-pay | Admitting: Family Medicine

## 2020-01-07 DIAGNOSIS — M797 Fibromyalgia: Secondary | ICD-10-CM

## 2020-01-09 NOTE — Progress Notes (Signed)
Chief Complaint:   OBESITY Sherral is here to discuss her progress with her obesity treatment plan along with follow-up of her obesity related diagnoses. Urvi is on the Category 3 Plan and states she is following her eating plan approximately 75% of the time. Kenzee states she is biking, weights, and walking for 45 minutes 2 times per week.  Today's visit was #: 3 Starting weight: 270 lbs Starting date: 12/07/2019 Today's weight: 268 lbs Today's date: 01/05/2020 Total lbs lost to date: 2 Total lbs lost since last in-office visit: 0  Interim History: Mckynzi had a significant panic attack over the past week and she needed to go to the emergency room for assistance in controlling. She is feeling like she is still in a depressive state. Sunday and Monday (3 and 4 days ago), she was ill so she didn't eat much. She has had some nausea and vomiting the past few days as well.  Subjective:   1. Vitamin D deficiency Jenell denies nausea, vomiting, or muscle weakness, but she notes fatigue. She is on prescription Vit D.  2. Bipolar 1 disorder (HCC) Elliannah is in a depressive episode right now. She denies suicidal ideas or homicidal ideas. She is on Lamictal, Seroquel, Vistaril, klonopin, and metformin.  Assessment/Plan:   1. Vitamin D deficiency Low Vitamin D level contributes to fatigue and are associated with obesity, breast, and colon cancer. Taylour agreed to continue taking prescription Vitamin D 50,000 IU every week, no refill needed. She will follow-up for routine testing of Vitamin D, at least 2-3 times per year to avoid over-replacement.  2. Bipolar 1 disorder (HCC) Keymoni is to follow up with her therapist as needed.  3. Class 3 severe obesity with serious comorbidity and body mass index (BMI) of 45.0 to 49.9 in adult, unspecified obesity type (HCC) Hassie is currently in the action stage of change. As such, her goal is to continue with weight loss efforts. She  has agreed to the Category 3 Plan.   Exercise goals: All adults should avoid inactivity. Some physical activity is better than none, and adults who participate in any amount of physical activity gain some health benefits.  Behavioral modification strategies: increasing lean protein intake, meal planning and cooking strategies and keeping healthy foods in the home.  Jessenia has agreed to follow-up with our clinic in 2 weeks. She was informed of the importance of frequent follow-up visits to maximize her success with intensive lifestyle modifications for her multiple health conditions.   Objective:   Blood pressure 103/73, pulse 97, temperature 98.3 F (36.8 C), temperature source Oral, height 5\' 4"  (1.626 m), weight (!) 268 lb (121.6 kg), last menstrual period 12/06/2019, SpO2 97 %. Body mass index is 46 kg/m.  General: Cooperative, alert, well developed, in no acute distress. HEENT: Conjunctivae and lids unremarkable. Cardiovascular: Regular rhythm.  Lungs: Normal work of breathing. Neurologic: No focal deficits.   Lab Results  Component Value Date   CREATININE 0.60 10/13/2019   BUN 13 10/13/2019   NA 137 10/13/2019   K 3.8 10/13/2019   CL 103 10/13/2019   CO2 24 10/13/2019   Lab Results  Component Value Date   ALT 52 (H) 06/17/2019   AST 22 06/17/2019   ALKPHOS 165 (H) 06/17/2019   BILITOT 0.4 06/17/2019   Lab Results  Component Value Date   HGBA1C 4.9 12/07/2019   HGBA1C 4.9 01/12/2018   HGBA1C 4.8 05/31/2017   Lab Results  Component Value Date  INSULIN 7.1 12/07/2019   Lab Results  Component Value Date   TSH 2.030 12/07/2019   Lab Results  Component Value Date   CHOL 213 (H) 12/07/2019   HDL 82 12/07/2019   LDLCALC 111 (H) 12/07/2019   LDLDIRECT 140.3 04/03/2010   TRIG 118 12/07/2019   CHOLHDL 3 01/12/2018   Lab Results  Component Value Date   WBC 8.7 10/13/2019   HGB 13.0 10/13/2019   HCT 38.9 10/13/2019   MCV 84.9 10/13/2019   PLT 321  10/13/2019   Lab Results  Component Value Date   IRON 26 (L) 07/12/2019   TIBC 360 07/12/2019   FERRITIN 14 (L) 07/12/2019   Attestation Statements:   Reviewed by clinician on day of visit: allergies, medications, problem list, medical history, surgical history, family history, social history, and previous encounter notes.  Time spent on visit including pre-visit chart review and post-visit care and charting was 17 minutes.    I, Burt Knack, am acting as transcriptionist for Reuben Likes, MD.  I have reviewed the above documentation for accuracy and completeness, and I agree with the above. - Katherina Mires, MD

## 2020-01-10 ENCOUNTER — Other Ambulatory Visit: Payer: Self-pay | Admitting: Physical Therapy

## 2020-01-10 DIAGNOSIS — M797 Fibromyalgia: Secondary | ICD-10-CM

## 2020-01-10 MED ORDER — PREGABALIN 75 MG PO CAPS: 75.0000 mg | ORAL_CAPSULE | Freq: Two times a day (BID) | ORAL | 0 refills | Status: DC

## 2020-01-10 MED ORDER — PREGABALIN 75 MG PO CAPS: 75.0000 mg | ORAL_CAPSULE | Freq: Two times a day (BID) | ORAL | 3 refills | Status: DC

## 2020-01-13 ENCOUNTER — Ambulatory Visit: Payer: Self-pay | Admitting: Psychology

## 2020-01-16 ENCOUNTER — Encounter: Payer: Self-pay | Admitting: Family Medicine

## 2020-01-17 ENCOUNTER — Telehealth: Payer: BC Managed Care – PPO | Admitting: Family

## 2020-01-17 DIAGNOSIS — B001 Herpesviral vesicular dermatitis: Secondary | ICD-10-CM

## 2020-01-17 DIAGNOSIS — W109XXA Fall (on) (from) unspecified stairs and steps, initial encounter: Secondary | ICD-10-CM | POA: Diagnosis not present

## 2020-01-17 DIAGNOSIS — Z7984 Long term (current) use of oral hypoglycemic drugs: Secondary | ICD-10-CM | POA: Diagnosis not present

## 2020-01-17 DIAGNOSIS — S0990XA Unspecified injury of head, initial encounter: Secondary | ICD-10-CM | POA: Diagnosis not present

## 2020-01-17 DIAGNOSIS — M542 Cervicalgia: Secondary | ICD-10-CM | POA: Diagnosis not present

## 2020-01-17 DIAGNOSIS — Z043 Encounter for examination and observation following other accident: Secondary | ICD-10-CM | POA: Diagnosis not present

## 2020-01-17 DIAGNOSIS — S0083XA Contusion of other part of head, initial encounter: Secondary | ICD-10-CM | POA: Diagnosis not present

## 2020-01-17 DIAGNOSIS — R110 Nausea: Secondary | ICD-10-CM | POA: Diagnosis not present

## 2020-01-17 DIAGNOSIS — R42 Dizziness and giddiness: Secondary | ICD-10-CM | POA: Diagnosis not present

## 2020-01-17 DIAGNOSIS — Z888 Allergy status to other drugs, medicaments and biological substances status: Secondary | ICD-10-CM | POA: Diagnosis not present

## 2020-01-17 DIAGNOSIS — M545 Low back pain: Secondary | ICD-10-CM | POA: Diagnosis not present

## 2020-01-17 DIAGNOSIS — G8911 Acute pain due to trauma: Secondary | ICD-10-CM | POA: Diagnosis not present

## 2020-01-17 DIAGNOSIS — Z79899 Other long term (current) drug therapy: Secondary | ICD-10-CM | POA: Diagnosis not present

## 2020-01-17 LAB — BASIC METABOLIC PANEL
BUN: 14 (ref 4–21)
CO2: 26 — AB (ref 13–22)
Chloride: 107 (ref 99–108)
Creatinine: 0.7 (ref 0.5–1.1)
Glucose: 88
Potassium: 4.1 (ref 3.4–5.3)
Sodium: 139 (ref 137–147)

## 2020-01-17 LAB — CBC: RBC: 4.24 (ref 3.87–5.11)

## 2020-01-17 LAB — CBC AND DIFFERENTIAL
HCT: 35 — AB (ref 36–46)
Hemoglobin: 11.2 — AB (ref 12.0–16.0)
Neutrophils Absolute: 5
Platelets: 299 (ref 150–399)
WBC: 8.2

## 2020-01-17 LAB — HEPATIC FUNCTION PANEL
ALT: 10 (ref 7–35)
AST: 16 (ref 13–35)
Alkaline Phosphatase: 164 — AB (ref 25–125)
Bilirubin, Total: 0.2

## 2020-01-17 LAB — COMPREHENSIVE METABOLIC PANEL
Albumin: 3.9 (ref 3.5–5.0)
Calcium: 8.7 (ref 8.7–10.7)
GFR calc Af Amer: 132
GFR calc non Af Amer: 115
Globulin: 2.6

## 2020-01-17 MED ORDER — VALACYCLOVIR HCL 1 G PO TABS: 2000.0000 mg | ORAL_TABLET | Freq: Two times a day (BID) | ORAL | 0 refills | Status: DC

## 2020-01-17 NOTE — Progress Notes (Signed)
We are sorry that you are not feeling well.  Here is how we plan to help!  Based on what you have shared with me it does look like you have a viral infection.    Most cold sores or fever blisters are small fluid filled blisters around the mouth caused by herpes simplex virus.  The most common strain of the virus causing cold sores is herpes simplex virus 1.  It can be spread by skin contact, sharing eating utensils, or even sharing towels.  Cold sores are contagious to other people until dry. (Approximately 5-7 days).  Wash your hands. You can spread the virus to your eyes through handling your contact lenses after touching the lesions.  Most people experience pain at the sight or tingling sensations in their lips that may begin before the ulcers erupt.  Herpes simplex is treatable but not curable.  It may lie dormant for a long time and then reappear due to stress or prolonged sun exposure.  Many patients have success in treating their cold sores with an over the counter topical called Abreva.  You may apply the cream up to 5 times daily (maximum 10 days) until healing occurs.  If you would like to use an oral antiviral medication to speed the healing of your cold sore, I have sent a prescription to your local pharmacy Valacyclovir 2 gm take one by mouth twice a day for 1 day    HOME CARE:   Wash your hands frequently.  Do not pick at or rub the sore.  Don't open the blisters.  Avoid kissing other people during this time.  Avoid sharing drinking glasses, eating utensils, or razors.  Do not handle contact lenses unless you have thoroughly washed your hands with soap and warm water!  Avoid oral sex during this time.  Herpes from sores on your mouth can spread to your partner's genital area.  Avoid contact with anyone who has eczema or a weakened immune system.  Cold sores are often triggered by exposure to intense sunlight, use a lip balm containing a sunscreen (SPF 30 or  higher).  GET HELP RIGHT AWAY IF:   Blisters look infected.  Blisters occur near or in the eye.  Symptoms last longer than 10 days.  Your symptoms become worse.  MAKE SURE YOU:   Understand these instructions.  Will watch your condition.  Will get help right away if you are not doing well or get worse.    Your e-visit answers were reviewed by a board certified advanced clinical practitioner to complete your personal care plan.  Depending upon the condition, your plan could have  Included both over the counter or prescription medications.    Please review your pharmacy choice.  Be sure that the pharmacy you have chosen is open so that you can pick up your prescription now.  If there is a problem you can message your provider in MyChart to have the prescription routed to another pharmacy.    Your safety is important to us.  If you have drug allergies check our prescription carefully.  For the next 24 hours you can use MyChart to ask questions about today's visit, request a non-urgent call back, or ask for a work or school excuse from your e-visit provider.  You will get an email in the next two days asking about your experience.  I hope that your e-visit has been valuable and will speed your recovery.  Greater than 5 minutes, yet less than   10 minutes of time have been spent researching, coordinating, and implementing care for this patient today.  Thank you for the details you included in the comment boxes. Those details are very helpful in determining the best course of treatment for you and help us to provide the best care.  

## 2020-01-18 ENCOUNTER — Ambulatory Visit: Payer: BC Managed Care – PPO | Admitting: Family Medicine

## 2020-01-18 ENCOUNTER — Other Ambulatory Visit: Payer: Self-pay

## 2020-01-18 ENCOUNTER — Encounter: Payer: Self-pay | Admitting: Family Medicine

## 2020-01-18 VITALS — BP 114/80 | HR 97 | Ht 64.0 in | Wt 273.2 lb

## 2020-01-18 DIAGNOSIS — S39012A Strain of muscle, fascia and tendon of lower back, initial encounter: Secondary | ICD-10-CM

## 2020-01-18 DIAGNOSIS — S300XXA Contusion of lower back and pelvis, initial encounter: Secondary | ICD-10-CM | POA: Diagnosis not present

## 2020-01-18 DIAGNOSIS — M797 Fibromyalgia: Secondary | ICD-10-CM

## 2020-01-18 DIAGNOSIS — M7918 Myalgia, other site: Secondary | ICD-10-CM

## 2020-01-18 MED ORDER — PREGABALIN 75 MG PO CAPS: 75.0000 mg | ORAL_CAPSULE | Freq: Two times a day (BID) | ORAL | 3 refills | Status: DC

## 2020-01-18 NOTE — Progress Notes (Signed)
I, Christoper Fabian, LAT, ATC, am serving as scribe for Dr. Clementeen Graham.  [name] [name] is a 34 y.o. female who presents to [facility] today for f/u of low back pain after tripping on her stairs yesterday and falling.  She was seen at the ED yesterday after her injury.  She was last seen by Dr. Denyse Amass on 09/09/19 and was prescribed Lyrica and advised to stop using hydrocodone.  Since her last visit, pt reports that her legs gave out on her yesterday while going downstairs and fell, landing on her L side.  She is having L-sided low back and L hip pain.  She's continuing to have numbness/tingling in her B legs.   Pertinent review of systems: No fevers or chills  Relevant historical information: Fibromyalgia on Lyrica   Exam:  BP 114/80 (BP Location: Right Arm, Patient Position: Sitting, Cuff Size: Large)   Pulse 97   Ht 5\' 4"  (1.626 m)   Wt 273 lb 3.2 oz (123.9 kg)   SpO2 98%   BMI 46.89 kg/m  General: Well Developed, well nourished, and in no acute distress.   MSK: L-spine nontender midline tender palpation lumbar paraspinal musculature in the left. Decreased lumbar motion. Left hip normal-appearing tender palpation greater trochanter and at piriformis region. Normal hip motion.  Intact strength abduction external rotation however painful with these tests. Pain with crossover stretch in figure 4 stretch.    Lab and Radiology Results  INDICATION:Head trauma, minor, normal mental status (Age 18-64y)   TECHNIQUE: CT HEAD AND CERVICAL SPINE WITHOUT IV CONTRAST   FINDINGS:   CT HEAD  There is no midline shift, intracranial hemorrhage, or acute mass effect. No abnormal extra-axial fluid collections are seen. The ventricles, cisterns, and sulci are unremarkable. The visualized orbits are unremarkable. The mastoid air cells are clear. No displaced or depressed calvarial fracture identified.   CT CERVICAL SPINE  No acute fracture or traumatic  malalignment identified. The vertebral body heights are intact. The facets align normally. The disc spaces are intact. No paraspinal hematoma identified.    IMPRESSION:   CT HEAD  1. No acute intracranial abnormality is identified.  2. No calvarial fracture is identified.   CT CERVICAL SPINE  1. No acute fracture or traumatic malalignment is identified.  2. No paraspinal hematoma is identified.   Electronically Signed by: [id number] Specimen Collected: 01/17/20 9:56 PM Last Resulted: 01/17/20 10:01 PM  Received From: [facility]  Result Received: 01/18/20 7:24 AM     EXAMINATION:Lumbar spine   CLINICAL INDICATION:Fall   TECHNIQUE:3 views   COMPARISON:None   FINDINGS:The alignment is normal.   No fracture identified.    IMPRESSION:Normal   Electronically Signed by: 03/19/20 Specimen Collected: 01/17/20 8:38 PM Last Resulted: 01/17/20 8:39 PM  Received From: [facility]  Result Received: 01/18/20 7:24 AM   EXAMINATION:Pelvis and left hip   CLINICAL INDICATION:Fall   TECHNIQUE:AP pelvis, 2 views left hip   COMPARISON:None   FINDINGS:No fracture or other abnormality    IMPRESSION:Normal   Electronically Signed by: 03/19/20 Specimen Collected: 01/17/20 8:37 PM Last Resulted: 01/17/20 8:38 PM  Received From: [facility]  Result Received: 01/18/20 7:24 AM       Assessment and Plan: 34 y.o. female with fall with contusion of buttocks with pain with activation of the hip abductors and external rotators.  Patient also likely suffered a lumbosacral strain.  All of this is also going to exacerbate her existing chronic  back pain.  Plan for home exercise program and limited physical therapy.  Recheck back with me in about a month or 6 weeks if not improved.  As for fibromyalgia patient is due for refill of her Lyrica which is working quite well.  We will refill Lyrica.    Orders Placed This Encounter  Procedures  . Ambulatory referral  to Physical Therapy    Referral Priority:   Routine    Referral Type:   Physical Medicine    Referral Reason:   Specialty Services Required    Requested Specialty:   Physical Therapy   Meds ordered this encounter  Medications  . pregabalin (LYRICA) 75 MG capsule    Sig: Take 1 capsule (75 mg total) by mouth 2 (two) times daily.    Dispense:  180 capsule    Refill:  3     Discussed warning signs or symptoms. Please see discharge instructions. Patient expresses understanding.   The above documentation has been reviewed and is accurate and complete Clementeen Graham, M.D.

## 2020-01-18 NOTE — Patient Instructions (Addendum)
Thank you for coming in today. Plan for home exercises.  If not improving PT already ordered for [facility].  Recheck with me in 6 weeks.  Tylenol, heat, ice, tens unit.

## 2020-01-22 ENCOUNTER — Encounter (INDEPENDENT_AMBULATORY_CARE_PROVIDER_SITE_OTHER): Payer: Self-pay | Admitting: Family Medicine

## 2020-01-23 DIAGNOSIS — F3181 Bipolar II disorder: Secondary | ICD-10-CM | POA: Diagnosis not present

## 2020-01-23 DIAGNOSIS — F1910 Other psychoactive substance abuse, uncomplicated: Secondary | ICD-10-CM | POA: Diagnosis not present

## 2020-01-24 ENCOUNTER — Ambulatory Visit (INDEPENDENT_AMBULATORY_CARE_PROVIDER_SITE_OTHER): Payer: BC Managed Care – PPO | Admitting: Family Medicine

## 2020-01-27 ENCOUNTER — Encounter: Payer: Self-pay | Admitting: Family Medicine

## 2020-01-27 ENCOUNTER — Telehealth: Payer: BC Managed Care – PPO | Admitting: Nurse Practitioner

## 2020-01-27 DIAGNOSIS — R109 Unspecified abdominal pain: Secondary | ICD-10-CM | POA: Diagnosis not present

## 2020-01-27 DIAGNOSIS — R1032 Left lower quadrant pain: Secondary | ICD-10-CM | POA: Diagnosis not present

## 2020-01-27 DIAGNOSIS — R300 Dysuria: Secondary | ICD-10-CM | POA: Diagnosis not present

## 2020-01-27 DIAGNOSIS — N10 Acute tubulo-interstitial nephritis: Secondary | ICD-10-CM | POA: Diagnosis not present

## 2020-01-27 DIAGNOSIS — Z79899 Other long term (current) drug therapy: Secondary | ICD-10-CM | POA: Diagnosis not present

## 2020-01-27 DIAGNOSIS — G4762 Sleep related leg cramps: Secondary | ICD-10-CM

## 2020-01-27 DIAGNOSIS — R112 Nausea with vomiting, unspecified: Secondary | ICD-10-CM | POA: Diagnosis not present

## 2020-01-27 DIAGNOSIS — Z888 Allergy status to other drugs, medicaments and biological substances status: Secondary | ICD-10-CM | POA: Diagnosis not present

## 2020-01-27 DIAGNOSIS — N12 Tubulo-interstitial nephritis, not specified as acute or chronic: Secondary | ICD-10-CM | POA: Diagnosis not present

## 2020-01-27 DIAGNOSIS — R399 Unspecified symptoms and signs involving the genitourinary system: Secondary | ICD-10-CM

## 2020-01-27 NOTE — Progress Notes (Signed)
Based on what you shared with me, I feel your condition warrants further evaluation and I recommend that you be seen for a face to face office visit.   NOTE: If you entered your credit card information for this eVisit, you will not be charged. You may see a "hold" on your card for the $35 but that hold will drop off and you will not have a charge processed.   If you are having a true medical emergency please call 911.      For an urgent face to face visit, [facility] has five urgent care centers for your convenience:      NEW:  [facility] at [address] Get Driving Directions [phone] [address] [place], [place] [address] . 10 am - 6pm Monday - Friday    [facility] ([place] Get Driving Directions [phone] [address] [place], [place] [address] . 10 am to 8 pm Monday-Friday . 12 pm to 8 pm Saturday-Sunday     [facility] Get Driving Directions [phone] [address], [address] [place], [place] [address] . 8 am to 8 pm Monday-Friday . 9 am to 6 pm Saturday . 11 am to 6 pm Sunday     [facility] [place] Get Driving Directions  [phone] [address] [place], [place] [address] . 8 am to 8 pm Monday-Friday . 8 am to 4 pm Saturday-Sunday    [facility]gent [facility] at [place] Get Driving Directions [phone] [address] [place], [place] [address] . 12 pm to 6 pm Monday-Friday      Your e-visit answers were reviewed by a board certified advanced clinical practitioner to complete your personal care plan.  Thank you for using e-Visits.  I have spent at least 5 minutes reviewing and documenting in the patient's chart.    

## 2020-01-31 MED ORDER — TIZANIDINE HCL 2 MG PO CAPS: 4.0000 mg | ORAL_CAPSULE | Freq: Three times a day (TID) | ORAL | 3 refills | Status: DC

## 2020-02-04 ENCOUNTER — Other Ambulatory Visit: Payer: Self-pay | Admitting: Family Medicine

## 2020-02-04 DIAGNOSIS — G4762 Sleep related leg cramps: Secondary | ICD-10-CM

## 2020-02-10 ENCOUNTER — Ambulatory Visit (INDEPENDENT_AMBULATORY_CARE_PROVIDER_SITE_OTHER): Payer: BC Managed Care – PPO | Admitting: Psychology

## 2020-02-10 DIAGNOSIS — F5101 Primary insomnia: Secondary | ICD-10-CM | POA: Diagnosis not present

## 2020-02-10 DIAGNOSIS — F4322 Adjustment disorder with anxiety: Secondary | ICD-10-CM

## 2020-02-14 ENCOUNTER — Encounter: Payer: Self-pay | Admitting: Family Medicine

## 2020-02-14 MED ORDER — VALACYCLOVIR HCL 1 G PO TABS
2000.0000 mg | ORAL_TABLET | Freq: Two times a day (BID) | ORAL | 0 refills | Status: DC
Start: 2020-02-14 — End: 2020-02-19

## 2020-02-19 ENCOUNTER — Ambulatory Visit
Admission: EM | Admit: 2020-02-19 | Discharge: 2020-02-19 | Disposition: A | Discharge status: Home / Self Care | Payer: BC Managed Care – PPO | Attending: Physician Assistant | Admitting: Physician Assistant

## 2020-02-19 ENCOUNTER — Encounter: Payer: Self-pay | Admitting: *Deleted

## 2020-02-19 ENCOUNTER — Other Ambulatory Visit: Payer: Self-pay

## 2020-02-19 DIAGNOSIS — R05 Cough: Secondary | ICD-10-CM | POA: Diagnosis not present

## 2020-02-19 DIAGNOSIS — R52 Pain, unspecified: Secondary | ICD-10-CM

## 2020-02-19 DIAGNOSIS — R059 Cough, unspecified: Secondary | ICD-10-CM

## 2020-02-19 DIAGNOSIS — R0981 Nasal congestion: Secondary | ICD-10-CM

## 2020-02-19 MED ORDER — PROMETHAZINE-DM 6.25-15 MG/5ML PO SYRP: 2.5000 mL | ORAL_SOLUTION | Freq: Four times a day (QID) | ORAL | 0 refills | Status: DC | PRN

## 2020-02-19 MED ORDER — IPRATROPIUM BROMIDE 0.06 % NA SOLN: 2.0000 | Freq: Four times a day (QID) | NASAL | 0 refills | Status: DC

## 2020-02-19 NOTE — ED Triage Notes (Signed)
C/O cough, congestion, runny nose, fatigue, body aches, sore throat, HA x 2 days with temp up to 100.2.

## 2020-02-19 NOTE — Discharge Instructions (Signed)
COVID PCR testing ordered. I would like you to quarantine until testing results. Promethazine-DM as needed for cough. Atrovent nasal spray and flonase nasal spray as directed. Tylenol/motrin for pain and fever. Keep hydrated, urine should be clear to pale yellow in color. If experiencing shortness of breath, trouble breathing, go to the emergency department for further evaluation needed.   For sore throat/cough try using a honey-based tea. Use 3 teaspoons of honey with juice squeezed from half lemon. Place shaved pieces of ginger into 1/2-1 cup of water and warm over stove top. Then mix the ingredients and repeat every 4 hours as needed.

## 2020-02-19 NOTE — ED Provider Notes (Signed)
[facility]    CSN: [id number] Arrival date & time: 02/19/20  1001      History   Chief Complaint Chief Complaint  Patient presents with  . Cough  . Nasal Congestion    HPI [name] [name] is a 34 y.o. female.   34 year old female comes in for 2 day of URI symptoms. Cough, nasal congestion, rhinorrhea, body aches, sore throat, fatigue, headache. tmax 100.2.  Denies abdominal pain, nausea, vomiting, diarrhea. Denies loss of taste/smell. Feels short of breath when coughing and exertion. Never smoker. COVID vaccinated.      Past Medical History:  Diagnosis Date  . ADD (attention deficit disorder)   . Anemia   . Anxiety   . B12 deficiency   . Back pain   . Bilateral swelling of feet   . Bipolar 2 disorder (HCC) 09/19/2019  . Constipation   . DEPRESSION   . Elevated cholesterol   . Family history of colon cancer 05/16/2019   Father, 7350s; deceased.   . Fatty liver   . Fibromyalgia   . Gallbladder problem   . GERD (gastroesophageal reflux disease)   . Heartburn   . History of stomach ulcers   . Hypertension   . Hypothyroidism    Hx of, normalized TSH during pregnancy 2011  . Infertility, female   . Lactose intolerance   . Migraines   . Morbid obesity (HCC)    s/p RY 08/2012 - start weight 290 pounds  . Nephrolithiasis   . Palpitations   . Panic disorder   . PCOS (polycystic ovarian syndrome)   . PONV (postoperative nausea and vomiting)   . Pregnancy induced hypertension   . Shortness of breath   . Vitamin D deficiency     Patient Active Problem List   Diagnosis Date Noted  . Plantar fasciitis 10/31/2019  . Bipolar 2 disorder (HCC) 09/19/2019  . Fibromyalgia 09/09/2019  . Ischial bursitis of left side 07/19/2019  . Vitamin B12 deficiency 06/01/2019  . Vitamin D deficiency 06/01/2019  . Panic disorder 06/01/2019  . Family history of colon cancer 05/16/2019  . Chronic constipation 05/16/2019  . Major depression, recurrent, chronic (HCC)  05/16/2019  . Insomnia due to other mental disorder 03/13/2019  . Pain in both lower extremities 11/21/2018  . Nocturnal leg cramps 10/26/2018  . Nephrolithiasis   . Reduced libido, due to Lexapro 07/09/2017  . Binge eating disorder 04/23/2017  . Morbid obesity (HCC) 04/23/2017  . Acquired iron deficiency anemia due to decreased absorption 02/22/2015  . 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 Quintella ReichertIKEN OSTEOTOMY Left 06/24/2017   Procedure: Ralene BatheAIKEN OSTEOTOMY;  Surgeon: Vivi BarrackWagoner, Matthew R, DPM;  Location: [facility];  Service: Podiatry;  Laterality: Left;  . BUNIONECTOMY Left 06/24/2017   Procedure: Anthoney HaradaALSTON BUNIONECTOMY;  Surgeon: Vivi BarrackWagoner, Matthew R, DPM;  Location: [facility];  Service: Podiatry;  Laterality: Left;  . CESAREAN SECTION     x 2  . CHOLECYSTECTOMY N/A 02/15/2019   Procedure: LAPAROSCOPIC CHOLECYSTECTOMY WITH INTRAOPERATIVE CHOLANGIOGRAM;  Surgeon: Andria MeuseWhite, Christopher M, MD;  Location: [facility];  Service: General;  Laterality: N/A;  . FOOT SURGERY    . 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   Procedure: LAPAROSCOPIC ROUX-EN-Y GASTRIC;  Surgeon: Atilano InaEric M Wilson, MD;  Location: [facility];  Service: General;  Laterality: N/A;  laparoscopic roux-en-y gastric bypass  . LITHOTRIPSY Left   . TONSILLECTOMY    . TUBAL  LIGATION    . UPPER GI ENDOSCOPY  08/24/2012   Procedure: UPPER GI ENDOSCOPY;  Surgeon: Atilano Ina, MD;  Location: [facility];  Service: General;;  . WISDOM TOOTH EXTRACTION      OB History    Gravida  2   Para  2   Term  2   Preterm  0   AB  0   Living  2     SAB  0   TAB  0   Ectopic  0   Multiple  0   Live Births  2            Home Medications    Prior to Admission medications   Medication Sig Start Date End Date Taking? Authorizing Provider  acetaminophen (TYLENOL) 500 MG tablet Take 2 tablets (1,000 mg total) by mouth every 6 (six) hours as needed for moderate pain.  02/13/19  Yes Fayrene Helper, PA-C  busPIRone (BUSPAR) 15 MG tablet Take 15 mg by mouth 3 (three) times daily. 11/01/19  Yes [provider]  clonazePAM (KLONOPIN) 1 MG tablet Take 0.5-1 tablets (0.5-1 mg total) by mouth 2 (two) times daily as needed for anxiety. 09/22/19  Yes Willow Ora, MD  cyanocobalamin (,VITAMIN B-12,) 1000 MCG/ML injection Inject 1 vial per week for 3 weeks, then 1 vial per month thereafter Patient taking differently: Inject 100 mcg into the skin once a week. Tuesdays 05/30/19  Yes Willow Ora, MD  diclofenac Sodium (VOLTAREN) 1 % GEL Apply 4 g topically 4 (four) times daily. 05/13/19  Yes Palumbo, April, MD  hydrOXYzine (VISTARIL) 50 MG capsule Take 50 mg by mouth every 6 (six) hours as needed. 08/24/19  Yes [provider]  lamoTRIgine (LAMICTAL) 200 MG tablet Take 200 mg by mouth daily. 11/28/19  Yes [provider]  Magnesium 250 MG TABS Take by mouth.   Yes [provider]  metFORMIN (GLUCOPHAGE) 500 MG tablet Take 500 mg by mouth 2 (two) times daily. 11/01/19  Yes [provider]  Multiple Vitamins-Iron (ONE-TABLET-DAILY/IRON PO) Take 1 tablet by mouth daily.   Yes [provider]  ondansetron (ZOFRAN ODT) 4 MG disintegrating tablet 4mg  ODT q4 hours prn nausea/vomit 10/13/19  Yes Ford, 10/15/19, PA-C  pregabalin (LYRICA) 75 MG capsule Take 1 capsule (75 mg total) by mouth 2 (two) times daily. 01/18/20  Yes 03/19/20, MD  QUEtiapine (SEROQUEL) 400 MG tablet Take 400 mg by mouth at bedtime.  08/24/19  Yes [provider]  tizanidine (ZANAFLEX) 2 MG capsule Take 2 capsules (4 mg total) by mouth 3 (three) times daily. 01/31/20  Yes 02/02/20, MD  valACYclovir (VALTREX) 1000 MG tablet Take 1 tablet (1,000 mg total) by mouth 3 (three) times daily. 05/25/19  Yes Martin, Mary-Margaret, FNP  Vitamin D, Ergocalciferol, (DRISDOL) 1.25 MG (50000 UT) CAPS capsule Take 50,000 Units by mouth once a week. Tuesday   Yes  [provider]  FIBER ADULT GUMMIES PO Take by mouth.    [provider]  ipratropium (ATROVENT) 0.06 % nasal spray Place 2 sprays into both nostrils 4 (four) times daily. 02/19/20   04/20/20, Niccolas Loeper V, PA-C  promethazine-dextromethorphan (PROMETHAZINE-DM) 6.25-15 MG/5ML syrup Take 2.5 mLs by mouth 4 (four) times daily as needed for cough. 02/19/20   Anjali Manzella V, PA-C  SYRINGE-NEEDLE, DISP, 3 ML 25G X 1" 3 ML MISC Use 1 needle per application once weekly 05/30/19   06/01/19, MD  Family History Family History  Problem Relation Age of Onset  . Hyperlipidemia Father   . Hypertension Father   . Kidney disease Father   . Colon cancer Father 52  . Cancer Father   . Depression Father   . Anxiety disorder Father   . Bipolar disorder Father   . Sleep apnea Father   . Obesity Father   . Hypertension Mother   . Depression Mother   . Anxiety disorder Mother   . Obesity Mother   . Hypertension Maternal Grandmother   . Uterine cancer Maternal Grandmother 20  . Diabetes Maternal Grandfather     Social History Social History   Tobacco Use  . Smoking status: Never Smoker  . Smokeless tobacco: Never Used  Vaping Use  . Vaping Use: Never used  Substance Use Topics  . Alcohol use: Yes    Comment: rare/socially  . Drug use: No     Allergies   Ambien [zolpidem] and Nsaids   Review of Systems Review of Systems  Reason unable to perform ROS: See HPI as above.     Physical Exam Triage Vital Signs ED Triage Vitals  Enc Vitals Group     BP 02/19/20 1048 121/85     Pulse Rate 02/19/20 1045 (!) 118     Resp 02/19/20 1045 18     Temp 02/19/20 1045 98.6 F (37 C)     Temp Source 02/19/20 1045 Temporal     SpO2 02/19/20 1045 96 %     Weight --      Height --      Head Circumference --      Peak Flow --      Pain Score 02/19/20 1046 7     Pain Loc --      Pain Edu? --      Excl. in GC? --    No data found.  Updated Vital Signs BP 121/85   Pulse (!) 118    Temp 98.6 F (37 C) (Temporal)   Resp 18   SpO2 96%   Physical Exam Constitutional:      General: She is not in acute distress.    Appearance: Normal appearance. She is well-developed. She is not ill-appearing, toxic-appearing or diaphoretic.  HENT:     Head: Normocephalic and atraumatic.     Right Ear: Tympanic membrane, ear canal and external ear normal. Tympanic membrane is not erythematous or bulging.     Left Ear: Tympanic membrane, ear canal and external ear normal. Tympanic membrane is not erythematous or bulging.     Nose:     Right Sinus: Frontal sinus tenderness present. No maxillary sinus tenderness.     Left Sinus: Frontal sinus tenderness present. No maxillary sinus tenderness.     Mouth/Throat:     Mouth: Mucous membranes are moist.     Pharynx: Oropharynx is clear. Uvula midline.  Eyes:     Conjunctiva/sclera: Conjunctivae normal.     Pupils: Pupils are equal, round, and reactive to light.  Cardiovascular:     Rate and Rhythm: Regular rhythm. Tachycardia present.  Pulmonary:     Effort: Pulmonary effort is normal. No accessory muscle usage, prolonged expiration, respiratory distress or retractions.     Breath sounds: No decreased air movement or transmitted upper airway sounds. No decreased breath sounds.     Comments: LCTAB Musculoskeletal:     Cervical back: Normal range of motion and neck supple.  Skin:    General: Skin is warm and  dry.  Neurological:     Mental Status: She is alert and oriented to person, place, and time.      UC Treatments / Results  Labs (all labs ordered are listed, but only abnormal results are displayed) Labs Reviewed  NOVEL CORONAVIRUS, NAA    EKG   Radiology No results found.  Procedures Procedures (including critical care time)  Medications Ordered in UC Medications - No data to display  Initial Impression / Assessment and Plan / UC Course  I have reviewed the triage vital signs and the nursing notes.  Pertinent  labs & imaging results that were available during my care of the patient were reviewed by me and considered in my medical decision making (see chart for details).    COVID PCR test ordered. Patient to quarantine until testing results return. No alarming signs on exam. LCTAB. Symptomatic treatment discussed.  Push fluids.  Return precautions given.  Patient expresses understanding and agrees to plan.  Final Clinical Impressions(s) / UC Diagnoses   Final diagnoses:  Cough  Nasal congestion  Body aches    ED Prescriptions    Medication Sig Dispense Auth. Provider   ipratropium (ATROVENT) 0.06 % nasal spray Place 2 sprays into both nostrils 4 (four) times daily. 15 mL Satoshi Kalas V, PA-C   promethazine-dextromethorphan (PROMETHAZINE-DM) 6.25-15 MG/5ML syrup Take 2.5 mLs by mouth 4 (four) times daily as needed for cough. 60 mL Cathie Hoops, Deshon Hsiao V, PA-C     I have reviewed the PDMP during this encounter.   Belinda Fisher, PA-C 02/19/20 1133

## 2020-02-20 ENCOUNTER — Telehealth: Payer: BC Managed Care – PPO | Admitting: Family

## 2020-02-20 DIAGNOSIS — R0602 Shortness of breath: Secondary | ICD-10-CM

## 2020-02-20 LAB — NOVEL CORONAVIRUS, NAA: SARS-CoV-2, NAA: NOT DETECTED

## 2020-02-20 NOTE — Progress Notes (Signed)
Based on what you shared with me, I feel your condition warrants further evaluation and I recommend that you be seen for a face to face office visit.    Given your shortness of breath you need to be seen face to face to rule out a more serious infection.    NOTE: If you entered your credit card information for this eVisit, you will not be charged. You may see a "hold" on your card for the $35 but that hold will drop off and you will not have a charge processed.   If you are having a true medical emergency please call 911.      For an urgent face to face visit, [facility] has five urgent care centers for your convenience:      NEW:  [facility] at [facility] Directions [phone] [address] [place], [place] [address] . 10 am - 6pm Monday - Friday    [facility]) Get Driving Directions [phone] [address] [place], [place] [address] . 10 am to 8 pm Monday-Friday . 12 pm to 8 pm [facility] Care at [facility] Get Driving Directions [phone] [place], [place] [address] . 8 am to 8 pm Monday-Friday . 9 am to 6 pm Saturday . 11 am to 6 pm Sunday     [facility] at [facility] Get Driving Directions  [phone] [address] [place], [place] [address] . 8 am to 8 pm Monday-Friday . 8 am to 4 pm [facility] at [facility] Directions [phone] [address] [place], [place] [address] . 12 pm to 6 pm Monday-Friday      Your e-visit answers were reviewed by a board certified advanced clinical practitioner to complete your personal care plan.  Thank you for using e-Visits.

## 2020-02-21 ENCOUNTER — Encounter: Payer: Self-pay | Admitting: Physician Assistant

## 2020-02-21 ENCOUNTER — Telehealth (INDEPENDENT_AMBULATORY_CARE_PROVIDER_SITE_OTHER): Payer: BC Managed Care – PPO | Admitting: Physician Assistant

## 2020-02-21 DIAGNOSIS — R059 Cough, unspecified: Secondary | ICD-10-CM

## 2020-02-21 DIAGNOSIS — Z7984 Long term (current) use of oral hypoglycemic drugs: Secondary | ICD-10-CM | POA: Diagnosis not present

## 2020-02-21 DIAGNOSIS — Z79899 Other long term (current) drug therapy: Secondary | ICD-10-CM | POA: Diagnosis not present

## 2020-02-21 DIAGNOSIS — R0600 Dyspnea, unspecified: Secondary | ICD-10-CM | POA: Diagnosis not present

## 2020-02-21 DIAGNOSIS — Z9884 Bariatric surgery status: Secondary | ICD-10-CM | POA: Diagnosis not present

## 2020-02-21 DIAGNOSIS — F410 Panic disorder [episodic paroxysmal anxiety] without agoraphobia: Secondary | ICD-10-CM | POA: Diagnosis not present

## 2020-02-21 DIAGNOSIS — J9811 Atelectasis: Secondary | ICD-10-CM | POA: Diagnosis not present

## 2020-02-21 DIAGNOSIS — R0602 Shortness of breath: Secondary | ICD-10-CM | POA: Diagnosis not present

## 2020-02-21 DIAGNOSIS — J069 Acute upper respiratory infection, unspecified: Secondary | ICD-10-CM | POA: Diagnosis not present

## 2020-02-21 DIAGNOSIS — F319 Bipolar disorder, unspecified: Secondary | ICD-10-CM | POA: Diagnosis not present

## 2020-02-21 DIAGNOSIS — Z886 Allergy status to analgesic agent status: Secondary | ICD-10-CM | POA: Diagnosis not present

## 2020-02-21 DIAGNOSIS — R05 Cough: Secondary | ICD-10-CM | POA: Diagnosis not present

## 2020-02-21 DIAGNOSIS — Z888 Allergy status to other drugs, medicaments and biological substances status: Secondary | ICD-10-CM | POA: Diagnosis not present

## 2020-02-21 DIAGNOSIS — Z20822 Contact with and (suspected) exposure to covid-19: Secondary | ICD-10-CM | POA: Diagnosis not present

## 2020-02-21 MED ORDER — HYDROCOD POLST-CPM POLST ER 10-8 MG/5ML PO SUER: 5.0000 mL | Freq: Every evening | ORAL | 0 refills | Status: DC | PRN

## 2020-02-21 NOTE — Progress Notes (Signed)
Virtual Visit via Video   I connected with [name] [name] on 02/21/20 at  1:00 PM EDT by a video enabled telemedicine application and verified that I am speaking with the correct person using two identifiers. Location patient: Home Location provider: [facility], Office Persons participating in the virtual visit: [place], Roberson PA-C  I discussed the limitations of evaluation and management by telemedicine and the availability of in person appointments. The patient expressed understanding and agreed to proceed.  Subjective:   HPI:   URI Patient was seen at urgent care on 02/19/20 -- was suspected to have viral infection and was given oral valtrex, as well as promethazine cough syrup with little relief of symptoms. COVID test was negative.  She went to the ED this morning for worsening SOB, dyspnea and cough. CXR showed mild atelectasis of L basis. She was prescribed tessalon, albuterol inhaler and atrovent nasal spray. She reports that the ED provider wanted to do oral prednisone however they were concerned that it was affect her medications/mood.  ROS: See pertinent positives and negatives per HPI.  Patient Active Problem List   Diagnosis Date Noted   Plantar fasciitis 10/31/2019   Bipolar 2 disorder (HCC) 09/19/2019   Fibromyalgia 09/09/2019   Ischial bursitis of left side 07/19/2019   Vitamin B12 deficiency 06/01/2019   Vitamin D deficiency 06/01/2019   Panic disorder 06/01/2019   Family history of colon cancer 05/16/2019   Chronic constipation 05/16/2019   Major depression, recurrent, chronic (HCC) 05/16/2019   Insomnia due to other mental disorder 03/13/2019   Pain in both lower extremities 11/21/2018   Nocturnal leg cramps 10/26/2018   Nephrolithiasis    Reduced libido, due to Lexapro 07/09/2017   Binge eating disorder 04/23/2017   Morbid obesity (HCC) 04/23/2017   Acquired iron deficiency anemia due to decreased absorption  02/22/2015   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    Social History   Tobacco Use   Smoking status: Never Smoker   Smokeless tobacco: Never Used  Substance Use Topics   Alcohol use: Yes    Comment: rare/socially    Current Outpatient Medications:    acetaminophen (TYLENOL) 500 MG tablet, Take 2 tablets (1,000 mg total) by mouth every 6 (six) hours as needed for moderate pain., Disp: 30 tablet, Rfl: 0   busPIRone (BUSPAR) 15 MG tablet, Take 15 mg by mouth 3 (three) times daily., Disp: , Rfl:    clonazePAM (KLONOPIN) 1 MG tablet, Take 0.5-1 tablets (0.5-1 mg total) by mouth 2 (two) times daily as needed for anxiety., Disp: 60 tablet, Rfl: 5   cyanocobalamin (,VITAMIN B-12,) 1000 MCG/ML injection, Inject 1 vial per week for 3 weeks, then 1 vial per month thereafter (Patient taking differently: Inject 100 mcg into the skin once a week. Tuesdays), Disp: 6 mL, Rfl: 3   diclofenac Sodium (VOLTAREN) 1 % GEL, Apply 4 g topically 4 (four) times daily., Disp: 100 g, Rfl: 0   FIBER ADULT GUMMIES PO, Take by mouth., Disp: , Rfl:    hydrOXYzine (VISTARIL) 50 MG capsule, Take 50 mg by mouth every 6 (six) hours as needed., Disp: , Rfl:    ipratropium (ATROVENT) 0.06 % nasal spray, Place 2 sprays into both nostrils 4 (four) times daily., Disp: 15 mL, Rfl: 0   lamoTRIgine (LAMICTAL) 200 MG tablet, Take 200 mg by mouth daily., Disp: , Rfl:    Magnesium 250 MG TABS, Take by mouth., Disp: , Rfl:    metFORMIN (GLUCOPHAGE)  500 MG tablet, Take 500 mg by mouth 2 (two) times daily., Disp: , Rfl:    Multiple Vitamins-Iron (ONE-TABLET-DAILY/IRON PO), Take 1 tablet by mouth daily., Disp: , Rfl:    ondansetron (ZOFRAN ODT) 4 MG disintegrating tablet, 4mg  ODT q4 hours prn nausea/vomit, Disp: 10 tablet, Rfl: 0   pregabalin (LYRICA) 75 MG capsule, Take 1 capsule (75 mg total) by mouth 2 (two) times daily., Disp: 180 capsule, Rfl: 3   promethazine-dextromethorphan (PROMETHAZINE-DM) 6.25-15  MG/5ML syrup, Take 2.5 mLs by mouth 4 (four) times daily as needed for cough., Disp: 60 mL, Rfl: 0   QUEtiapine (SEROQUEL) 400 MG tablet, Take 400 mg by mouth at bedtime. , Disp: , Rfl:    SYRINGE-NEEDLE, DISP, 3 ML 25G X 1" 3 ML MISC, Use 1 needle per application once weekly, Disp: 12 each, Rfl: 0   tizanidine (ZANAFLEX) 2 MG capsule, Take 2 capsules (4 mg total) by mouth 3 (three) times daily., Disp: 90 capsule, Rfl: 3   valACYclovir (VALTREX) 1000 MG tablet, Take 1 tablet (1,000 mg total) by mouth 3 (three) times daily., Disp: 21 tablet, Rfl: 0   Vitamin D, Ergocalciferol, (DRISDOL) 1.25 MG (50000 UT) CAPS capsule, Take 50,000 Units by mouth once a week. Tuesday, Disp: , Rfl:   Allergies  Allergen Reactions   Ambien [Zolpidem] Other (See Comments)    Sleep walking    Nsaids     Gastric bypass    Objective:   VITALS: Per patient if applicable, see vitals. GENERAL: Alert, appears well and in no acute distress. HEENT: Atraumatic, conjunctiva clear, no obvious abnormalities on inspection of external nose and ears. NECK: Normal movements of the head and neck. CARDIOPULMONARY: No increased WOB. Speaking in clear sentences. I:E ratio WNL.  MS: Moves all visible extremities without noticeable abnormality. PSYCH: Pleasant and cooperative, well-groomed. Speech normal rate and rhythm. Affect is appropriate. Insight and judgement are appropriate. Attention is focused, linear, and appropriate.  NEURO: CN grossly intact. Oriented as arrived to appointment on time with no prompting. Moves both UE equally.  SKIN: No obvious lesions, wounds, erythema, or cyanosis noted on face or hands.  Assessment and Plan:   Diagnoses and all orders for this visit:  Cough   No red flags on discussion. She is clinically improving since onset. Will trial tussionex -- she is aware that this is a controlled substance and should not be mixed with other controlled substances or used for longer than intended  duration. Further refills of controlled substances must come from PCP or specialist.  I discussed the assessment and treatment plan with the patient. The patient was provided an opportunity to ask questions and all were answered. The patient agreed with the plan and demonstrated an understanding of the instructions.   The patient was advised to call back or seek an in-person evaluation if the symptoms worsen or if the condition fails to improve as anticipated.   [place], [place] 02/21/2020

## 2020-02-28 ENCOUNTER — Other Ambulatory Visit: Payer: Self-pay

## 2020-02-28 ENCOUNTER — Encounter: Payer: Self-pay | Admitting: Family Medicine

## 2020-02-28 DIAGNOSIS — H52223 Regular astigmatism, bilateral: Secondary | ICD-10-CM | POA: Diagnosis not present

## 2020-02-28 NOTE — Telephone Encounter (Signed)
Last refill: 09/22/19 #60, 5 Last OV: 09/06/19 dx. Bipolar disorder

## 2020-02-29 ENCOUNTER — Encounter: Payer: Self-pay | Admitting: Family Medicine

## 2020-02-29 ENCOUNTER — Ambulatory Visit: Payer: BC Managed Care – PPO | Admitting: Family Medicine

## 2020-02-29 ENCOUNTER — Other Ambulatory Visit: Payer: Self-pay

## 2020-02-29 VITALS — BP 130/86 | HR 97 | Ht 64.0 in | Wt 275.6 lb

## 2020-02-29 DIAGNOSIS — G4762 Sleep related leg cramps: Secondary | ICD-10-CM

## 2020-02-29 DIAGNOSIS — M797 Fibromyalgia: Secondary | ICD-10-CM

## 2020-02-29 MED ORDER — CLONAZEPAM 1 MG PO TABS
0.5000 mg | ORAL_TABLET | Freq: Two times a day (BID) | ORAL | 5 refills | Status: DC | PRN
Start: 2020-02-29 — End: 2020-05-19

## 2020-02-29 MED ORDER — TIZANIDINE HCL 2 MG PO CAPS: 4.0000 mg | ORAL_CAPSULE | Freq: Three times a day (TID) | ORAL | 3 refills | Status: DC | PRN

## 2020-02-29 NOTE — Progress Notes (Signed)
   I, Christoper Fabian, LAT, ATC, am serving as scribe for Dr. Clementeen Graham.  [name] [name] is a 34 y.o. female who presents to [facility] today for f/u of fibromyalgia.  She was last seen by Dr. Denyse Amass on 01/18/20 for her low back and L hip after falling down some stairs.  She was referred to PT but has not completed any visits.  She has been taking Lyrica.  Since her last visit, pt reports that her L LBP is fairly well controlled w/ Tizandine.  She takes 2 tizanidine pills typically twice daily most days.   Pertinent review of systems: No fevers or chills  Relevant historical information: Fibromyalgia   Exam:  BP 130/86 (BP Location: Right Arm, Patient Position: Sitting, Cuff Size: Large)   Pulse 97   Ht 5\' 4"  (1.626 m)   Wt 275 lb 9.6 oz (125 kg)   SpO2 99%   BMI 47.31 kg/m  General: Well Developed, well nourished, and in no acute distress.   MSK: L-spine normal motion. Normal gait.     Assessment and Plan: 34 y.o. female with fibromyalgia doing pretty well with Lyrica and intermittent tizanidine.  Plan to continue current regimen along with home exercise program.  Buttocks pain now resolved.   PDMP not reviewed this encounter. No orders of the defined types were placed in this encounter.  Meds ordered this encounter  Medications  . tizanidine (ZANAFLEX) 2 MG capsule    Sig: Take 2 capsules (4 mg total) by mouth 3 (three) times daily as needed for muscle spasms.    Dispense:  180 capsule    Refill:  3     Discussed warning signs or symptoms. Please see discharge instructions. Patient expresses understanding.   The above documentation has been reviewed and is accurate and complete 31, M.D.

## 2020-02-29 NOTE — Patient Instructions (Signed)
Thank you for coming in today. Continue current management for pain with lyrica and as needed tizanidine.  Recheck with me as needed.   As an aside you should be eligible for a 3rd COVID booster shot now.

## 2020-03-11 ENCOUNTER — Encounter: Payer: Self-pay | Admitting: Family Medicine

## 2020-03-12 ENCOUNTER — Ambulatory Visit: Payer: BC Managed Care – PPO | Admitting: Family Medicine

## 2020-03-12 ENCOUNTER — Other Ambulatory Visit: Payer: Self-pay

## 2020-03-12 ENCOUNTER — Encounter: Payer: Self-pay | Admitting: Family Medicine

## 2020-03-12 VITALS — BP 128/90 | HR 99 | Temp 98.3°F | Resp 17 | Wt 275.4 lb

## 2020-03-12 DIAGNOSIS — F410 Panic disorder [episodic paroxysmal anxiety] without agoraphobia: Secondary | ICD-10-CM

## 2020-03-12 DIAGNOSIS — F3181 Bipolar II disorder: Secondary | ICD-10-CM

## 2020-03-12 NOTE — Progress Notes (Signed)
Subjective  CC:  Chief Complaint  Patient presents with  . panic disorder    increase in panic attacks starting 3 weeks ago, was seen at [facility] ED - does not remember what day- was given ativan and EKG    HPI: [name] [name] is a 34 y.o. female who presents to the office today to address the problems listed above in the chief complaint, mood problems.  Lashea is experiencing worsening panic attacks that started abruptly about 2 to 3 weeks ago.  Unclear trigger.  Symptoms are moderate to severe.  Had a panic attack that lasted over an hour last night.  Has classic symptoms.  Uses Klonopin which is only marginally helpful.  Her anxiety has worsened over the last several months for unclear reasons.  Using Klonopin 2-3 times daily.  She is currently seeing psychiatry who is adjusting medications for bipolar disorder.  She does have an appointment with him in 2 days.  She is not suicidal but she is significantly more depressed than she was.  She is very tearful today.  She is berating herself or not being a good mother not being able to do what she needs to do.  She is having trouble keeping up with her daily chores and caring for the children.  She feels guilty.  She is seeing a psychotherapist.   Depression screen [facility] 2/9 03/12/2020 12/07/2019 09/06/2019  Decreased Interest 3 3 2   Down, Depressed, Hopeless 3 3 2   PHQ - 2 Score 6 6 4   Altered sleeping 1 1 1   Tired, decreased energy 3 3 2   Change in appetite 3 3 1   Feeling bad or failure about yourself  3 3 1   Trouble concentrating 3 3 3   Moving slowly or fidgety/restless 3 1 0  Suicidal thoughts 1 2 0  PHQ-9 Score 23 22 12   Difficult doing work/chores Extremely dIfficult Extremely dIfficult Very difficult  Some recent data might be hidden   GAD 7 : Generalized Anxiety Score 03/12/2020 09/06/2019 06/01/2018 06/08/2017  Nervous, Anxious, on Edge 3 2 1 1   Control/stop worrying 2 2 2 1   Worry too much - different things 3 3 2 2   Trouble  relaxing 3 1 2 1   Restless 1 0 2 1  Easily annoyed or irritable 3 1 2 1   Afraid - awful might happen 3 2 3  0  Total GAD 7 Score 18 11 14 7   Anxiety Difficulty Extremely difficult Very difficult - -     Assessment  1. Panic disorder   2. Bipolar 2 disorder (HCC)      Plan   Worsening panic attacks and bipolar 2 disorder with depressive symptoms.  Counseling done.  Work on strategies for managing stressors.  Discussed panic disorder and reassured that panic symptoms although feel horrible are not life-threatening.  Continue Klonopin.  Defer to psychiatry for further mood medication management. This visit was 40 minutes spent taking history, counseling and discussing medication management.  Difficult case.  Needs close follow-up.  High risk Follow up: As needed No orders of the defined types were placed in this encounter.  No orders of the defined types were placed in this encounter.     I reviewed the patients updated PMH, FH, and SocHx.    Patient Active Problem List   Diagnosis Date Noted  . Panic disorder 06/01/2019    Priority: High  . Family history of colon cancer 05/16/2019    Priority: High  . Major depression,  recurrent, chronic (HCC) 05/16/2019    Priority: High  . Insomnia due to other mental disorder 03/13/2019    Priority: High  . Morbid obesity (HCC) 04/23/2017    Priority: High  . Acquired iron deficiency anemia due to decreased absorption 02/22/2015    Priority: High  . 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    Priority: High  . Vitamin B12 deficiency 06/01/2019    Priority: Medium  . Chronic constipation 05/16/2019    Priority: Medium  . Pain in both lower extremities 11/21/2018    Priority: Medium  . Nocturnal leg cramps 10/26/2018    Priority: Medium  . Nephrolithiasis     Priority: Medium  . Binge eating disorder 04/23/2017    Priority: Medium  . Vitamin D deficiency 06/01/2019    Priority: Low  . Reduced libido,  due to Lexapro 07/09/2017    Priority: Low  . Plantar fasciitis 10/31/2019  . Bipolar 2 disorder (HCC) 09/19/2019  . Fibromyalgia 09/09/2019  . Ischial bursitis of left side 07/19/2019   Current Meds  Medication Sig  . acetaminophen (TYLENOL) 500 MG tablet Take 2 tablets (1,000 mg total) by mouth every 6 (six) hours as needed for moderate pain.  . busPIRone (BUSPAR) 15 MG tablet Take 15 mg by mouth 3 (three) times daily.  . clonazePAM (KLONOPIN) 1 MG tablet Take 0.5-1 tablets (0.5-1 mg total) by mouth 2 (two) times daily as needed for anxiety.  . cyanocobalamin (,VITAMIN B-12,) 1000 MCG/ML injection Inject 1 vial per week for 3 weeks, then 1 vial per month thereafter (Patient taking differently: Inject 100 mcg into the skin once a week. Tuesdays)  . diclofenac Sodium (VOLTAREN) 1 % GEL Apply 4 g topically 4 (four) times daily.  Marland Kitchen FIBER ADULT GUMMIES PO Take by mouth.  . hydrOXYzine (VISTARIL) 50 MG capsule Take 50 mg by mouth every 6 (six) hours as needed.  Marland Kitchen ipratropium (ATROVENT) 0.06 % nasal spray Place 2 sprays into both nostrils 4 (four) times daily.  Marland Kitchen lamoTRIgine (LAMICTAL) 200 MG tablet Take 200 mg by mouth daily.  . Magnesium 250 MG TABS Take by mouth.  . metFORMIN (GLUCOPHAGE) 500 MG tablet Take 500 mg by mouth 2 (two) times daily.  . Multiple Vitamins-Iron (ONE-TABLET-DAILY/IRON PO) Take 1 tablet by mouth daily.  . ondansetron (ZOFRAN ODT) 4 MG disintegrating tablet 4mg  ODT q4 hours prn nausea/vomit  . pregabalin (LYRICA) 75 MG capsule Take 1 capsule (75 mg total) by mouth 2 (two) times daily.  . QUEtiapine (SEROQUEL) 400 MG tablet Take 400 mg by mouth at bedtime.   . SYRINGE-NEEDLE, DISP, 3 ML 25G X 1" 3 ML MISC Use 1 needle per application once weekly  . tizanidine (ZANAFLEX) 2 MG capsule Take 2 capsules (4 mg total) by mouth 3 (three) times daily as needed for muscle spasms.  . valACYclovir (VALTREX) 1000 MG tablet Take 1 tablet (1,000 mg total) by mouth 3 (three) times daily.   . Vitamin D, Ergocalciferol, (DRISDOL) 1.25 MG (50000 UT) CAPS capsule Take 50,000 Units by mouth once a week. Tuesday    Allergies: Patient is allergic to ambien [zolpidem] and nsaids. Family history:  Patient family history includes Anxiety disorder in her father and mother; Bipolar disorder in her father; Cancer in her father; Colon cancer (age of onset: 54) in her father; Depression in her father and mother; Diabetes in her maternal grandfather; Hyperlipidemia in her father; Hypertension in her father, maternal grandmother, and mother; Kidney disease in her father; Obesity in  her father and mother; Sleep apnea in her father; Uterine cancer (age of onset: 51) in her maternal grandmother. Social History   Socioeconomic History  . Marital status: Married    Spouse name: Not on file  . Number of children: Not on file  . Years of education: Not on file  . Highest education level: Not on file  Occupational History  . Occupation: stay at home mom  Tobacco Use  . Smoking status: Never Smoker  . Smokeless tobacco: Never Used  Vaping Use  . Vaping Use: Never used  Substance and Sexual Activity  . Alcohol use: Yes    Comment: rare/socially  . Drug use: No  . Sexual activity: Not on file    Comment: tubal ligation  Other Topics Concern  . Not on file  Social History Narrative   Married, lives with spouse and dtr born 07/2009. Employed as Associate Professor at [employer].   Social Determinants of Health   Financial Resource Strain:   . Difficulty of Paying Living Expenses: Not on file  Food Insecurity:   . Worried About Programme researcher, broadcasting/film/video in the Last Year: Not on file  . Ran Out of Food in the Last Year: Not on file  Transportation Needs:   . Lack of Transportation (Medical): Not on file  . Lack of Transportation (Non-Medical): Not on file  Physical Activity:   . Days of Exercise per Week: Not on file  . Minutes of Exercise per Session: Not on file  Stress:   . Feeling of Stress : Not on  file  Social Connections:   . Frequency of Communication with Friends and Family: Not on file  . Frequency of Social Gatherings with Friends and Family: Not on file  . Attends Religious Services: Not on file  . Active Member of Clubs or Organizations: Not on file  . Attends Banker Meetings: Not on file  . Marital Status: Not on file     Review of Systems: Constitutional: Negative for fever malaise or anorexia Cardiovascular: negative for chest pain Respiratory: negative for SOB or persistent cough Gastrointestinal: negative for abdominal pain  Objective  Vitals: BP 128/90   Pulse 99   Temp 98.3 F (36.8 C) (Temporal)   Resp 17   Wt 275 lb 6.4 oz (124.9 kg)   SpO2 98%   BMI 47.27 kg/m  General: no acute distress, well appearing, no apparent distress, well groomed Psych: Patient is alert and oriented.  Speech is normal.  Insight is poor.  Appears tired, depressed, hypokinetic.  Crying.   Commons side effects, risks, benefits, and alternatives for medications and treatment plan prescribed today were discussed, and the patient expressed understanding of the given instructions. Patient is instructed to call or message via MyChart if he/she has any questions or concerns regarding our treatment plan. No barriers to understanding were identified. We discussed Red Flag symptoms and signs in detail. Patient expressed understanding regarding what to do in case of urgent or emergency type symptoms.   Medication list was reconciled, printed and provided to the patient in AVS. Patient instructions and summary information was reviewed with the patient as documented in the AVS. This note was prepared with assistance of Dragon voice recognition software. Occasional wrong-word or sound-a-like substitutions may have occurred due to the inherent limitations of voice recognition software

## 2020-03-12 NOTE — Patient Instructions (Signed)
Please discuss your mood symptoms with your psychiatrist who will be able to adjust your medications.    Mindfulness-Based Stress Reduction Mindfulness-based stress reduction (MBSR) is a program that helps people learn to practice mindfulness. Mindfulness is the practice of intentionally paying attention to the present moment. It can be learned and practiced through techniques such as education, breathing exercises, meditation, and yoga. MBSR includes several mindfulness techniques in one program. MBSR works best when you understand the treatment, are willing to try new things, and can commit to spending time practicing what you learn. MBSR training may include learning about:  How your emotions, thoughts, and reactions affect your body.  New ways to respond to things that cause negative thoughts to start (triggers).  How to notice your thoughts and let go of them.  Practicing awareness of everyday things that you normally do without thinking.  The techniques and goals of different types of meditation. What are the benefits of MBSR? MBSR can have many benefits, which include helping you to:  Develop self-awareness. This refers to knowing and understanding yourself.  Learn skills and attitudes that help you to participate in your own health care.  Learn new ways to care for yourself.  Be more accepting about how things are, and let things go.  Be less judgmental and approach things with an open mind.  Be patient with yourself and trust yourself more. MBSR has also been shown to:  Reduce negative emotions, such as depression and anxiety.  Improve memory and focus.  Change how you sense and approach pain.  Boost your body's ability to fight infections.  Help you connect better with other people.  Improve your sense of well-being. Follow these instructions at home:   Find a local in-person or online MBSR program.  Set aside some time regularly for mindfulness  practice.  Find a mindfulness practice that works best for you. This may include one or more of the following: ? Meditation. Meditation involves focusing your mind on a certain thought or activity. ? Breathing awareness exercises. These help you to stay present by focusing on your breath. ? Body scan. For this practice, you lie down and pay attention to each part of your body from head to toe. You can identify tension and soreness and intentionally relax parts of your body. ? Yoga. Yoga involves stretching and breathing, and it can improve your ability to move and be flexible. It can also provide an experience of testing your body's limits, which can help you release stress. ? Mindful eating. This way of eating involves focusing on the taste, texture, color, and smell of each bite of food. Because this slows down eating and helps you feel full sooner, it can be an important part of a weight-loss plan.  Find a podcast or recording that provides guidance for breathing awareness, body scan, or meditation exercises. You can listen to these any time when you have a free moment to rest without distractions.  Follow your treatment plan as told by your health care provider. This may include taking regular medicines and making changes to your diet or lifestyle as recommended. How to practice mindfulness To do a basic awareness exercise:  Find a comfortable place to sit.  Pay attention to the present moment. Observe your thoughts, feelings, and surroundings just as they are.  Avoid placing judgment on yourself, your feelings, or your surroundings. Make note of any judgment that comes up, and let it go.  Your mind may wander, and that  is okay. Make note of when your thoughts drift, and return your attention to the present moment. To do basic mindfulness meditation:  Find a comfortable place to sit. This may include a stable chair or a firm floor cushion. ? Sit upright with your back straight. Let your  arms fall next to your side with your hands resting on your legs. ? If sitting in a chair, rest your feet flat on the floor. ? If sitting on a cushion, cross your legs in front of you.  Keep your head in a neutral position with your chin dropped slightly. Relax your jaw and rest the tip of your tongue on the roof of your mouth. Drop your gaze to the floor. You can close your eyes if you like.  Breathe normally and pay attention to your breath. Feel the air moving in and out of your nose. Feel your belly expanding and relaxing with each breath.  Your mind may wander, and that is okay. Make note of when your thoughts drift, and return your attention to your breath.  Avoid placing judgment on yourself, your feelings, or your surroundings. Make note of any judgment or feelings that come up, let them go, and bring your attention back to your breath.  When you are ready, lift your gaze or open your eyes. Pay attention to how your body feels after the meditation. Where to find more information You can find more information about MBSR from:  Your health care provider.  Community-based meditation centers or programs.  Programs offered near you. Summary  Mindfulness-based stress reduction (MBSR) is a program that teaches you how to intentionally pay attention to the present moment. It is used with other treatments to help you cope better with daily stress, emotions, and pain.  MBSR focuses on developing self-awareness, which allows you to respond to life stress without judgment or negative emotions.  MBSR programs may involve learning different mindfulness practices, such as breathing exercises, meditation, yoga, body scan, or mindful eating. Find a mindfulness practice that works best for you, and set aside time for it on a regular basis. This information is not intended to replace advice given to you by your health care provider. Make sure you discuss any questions you have with your health care  provider. Document Revised: 05/15/2017 Document Reviewed: 10/09/2016 Elsevier Patient Education  2020 [employer].

## 2020-03-14 ENCOUNTER — Ambulatory Visit: Payer: BC Managed Care – PPO | Admitting: Family Medicine

## 2020-03-14 DIAGNOSIS — F1910 Other psychoactive substance abuse, uncomplicated: Secondary | ICD-10-CM | POA: Diagnosis not present

## 2020-03-14 DIAGNOSIS — F3181 Bipolar II disorder: Secondary | ICD-10-CM | POA: Diagnosis not present

## 2020-03-22 ENCOUNTER — Ambulatory Visit (INDEPENDENT_AMBULATORY_CARE_PROVIDER_SITE_OTHER): Payer: BC Managed Care – PPO | Admitting: Psychology

## 2020-03-22 DIAGNOSIS — F5101 Primary insomnia: Secondary | ICD-10-CM | POA: Diagnosis not present

## 2020-03-22 DIAGNOSIS — F4322 Adjustment disorder with anxiety: Secondary | ICD-10-CM | POA: Diagnosis not present

## 2020-03-26 ENCOUNTER — Encounter: Payer: Self-pay | Admitting: Family Medicine

## 2020-03-27 NOTE — Telephone Encounter (Signed)
Please let her know we can't draw labs for outside specialist. He can send [facility] to [facility] or lab of his choice.  thanks

## 2020-04-05 ENCOUNTER — Encounter: Payer: Self-pay | Admitting: Family Medicine

## 2020-04-05 DIAGNOSIS — R7309 Other abnormal glucose: Secondary | ICD-10-CM | POA: Diagnosis not present

## 2020-04-05 DIAGNOSIS — F1910 Other psychoactive substance abuse, uncomplicated: Secondary | ICD-10-CM | POA: Diagnosis not present

## 2020-04-05 DIAGNOSIS — Z20822 Contact with and (suspected) exposure to covid-19: Secondary | ICD-10-CM | POA: Diagnosis not present

## 2020-04-05 DIAGNOSIS — H6691 Otitis media, unspecified, right ear: Secondary | ICD-10-CM | POA: Diagnosis not present

## 2020-04-05 DIAGNOSIS — J029 Acute pharyngitis, unspecified: Secondary | ICD-10-CM | POA: Diagnosis not present

## 2020-04-05 DIAGNOSIS — F3181 Bipolar II disorder: Secondary | ICD-10-CM | POA: Diagnosis not present

## 2020-04-05 DIAGNOSIS — Z79899 Other long term (current) drug therapy: Secondary | ICD-10-CM | POA: Diagnosis not present

## 2020-04-10 ENCOUNTER — Telehealth (INDEPENDENT_AMBULATORY_CARE_PROVIDER_SITE_OTHER): Payer: BC Managed Care – PPO | Admitting: Family Medicine

## 2020-04-10 ENCOUNTER — Encounter: Payer: Self-pay | Admitting: Family Medicine

## 2020-04-10 VITALS — Wt 271.0 lb

## 2020-04-10 DIAGNOSIS — K148 Other diseases of tongue: Secondary | ICD-10-CM

## 2020-04-10 DIAGNOSIS — R059 Cough, unspecified: Secondary | ICD-10-CM

## 2020-04-10 DIAGNOSIS — R0981 Nasal congestion: Secondary | ICD-10-CM

## 2020-04-10 DIAGNOSIS — F3181 Bipolar II disorder: Secondary | ICD-10-CM | POA: Diagnosis not present

## 2020-04-10 DIAGNOSIS — F1910 Other psychoactive substance abuse, uncomplicated: Secondary | ICD-10-CM | POA: Diagnosis not present

## 2020-04-10 MED ORDER — CLOTRIMAZOLE 10 MG MT TROC: 10.0000 mg | Freq: Every day | OROMUCOSAL | 0 refills | Status: DC

## 2020-04-10 MED ORDER — BENZONATATE 100 MG PO CAPS: 100.0000 mg | ORAL_CAPSULE | Freq: Three times a day (TID) | ORAL | 0 refills | Status: DC | PRN

## 2020-04-10 NOTE — Patient Instructions (Signed)
-  I sent the medication(s) we discussed to your pharmacy: Meds ordered this encounter  Medications  . clotrimazole (MYCELEX) 10 MG troche    Sig: Take 1 tablet (10 mg total) by mouth 5 (five) times daily.    Dispense:  50 Troche    Refill:  0  . benzonatate (TESSALON PERLES) 100 MG capsule    Sig: Take 1 capsule (100 mg total) by mouth 3 (three) times daily as needed.    Dispense:  20 capsule    Refill:  0   Also can try Oragel before meals if needed for tongue pain  Nasal saline twice daily  Eat a healthy diet and drink plenty of water, avoid dairy and sweets  I hope you are feeling better soon!  Seek in person care promptly if your symptoms worsen, new concerns arise or you are not improving with treatment.  It was nice to meet you today. I help [place] out with telemedicine visits on Tuesdays and Thursdays and am available for visits on those days. If you have any concerns or questions following this visit please schedule a follow up visit with your Primary Care doctor or seek care at a local urgent care clinic to avoid delays in care.

## 2020-04-10 NOTE — Progress Notes (Signed)
Virtual Visit via Video Note  I connected with [name] [name]  on 04/10/20 at  6:20 PM EDT by a video enabled telemedicine application and verified that I am speaking with the correct person using two identifiers.  Location patient: home, [place] Location provider:work or home office Persons participating in the virtual visit: patient, provider  I discussed the limitations of evaluation and management by telemedicine and the availability of in person appointments. The patient expressed understanding and agreed to proceed.   HPI:  Acute telemedicine visit for cough: -Onset: 5 days ago -Symptoms include: nasal congestion, cough, headache, ear discomfort, sore white spots on tongue, some chest tightness at times - she thinks is from her panic disorder and not from being sick as reports often gets this whenever sick -husband sick with the same -Denies: fevers, SOB, wheezing, NVD, inability to get out of bed/eat/drink -Has tried: nyquil, clotrimazole lozenges (but she does not have enough) she had from a thrush infection in the past -Pertinent past medical history: -Pertinent medication allergies: -COVID-19 vaccine status: fully vaccinated for covid and had booster 3 weeks ago, had flu vaccine  ROS: See pertinent positives and negatives per HPI.  Past Medical History:  Diagnosis Date  . ADD (attention deficit disorder)   . Anemia   . Anxiety   . B12 deficiency   . Back pain   . Bilateral swelling of feet   . Bipolar 2 disorder (HCC) 09/19/2019  . Constipation   . DEPRESSION   . Elevated cholesterol   . Family history of colon cancer 06-06-2019   Father, 72s; deceased.   . Fatty liver   . Fibromyalgia   . Gallbladder problem   . GERD (gastroesophageal reflux disease)   . Heartburn   . History of stomach ulcers   . Hypertension   . Hypothyroidism    Hx of, normalized TSH during pregnancy 2011  . Infertility, female   . Lactose intolerance   . Migraines   . Morbid obesity (HCC)    s/p RY  08/2012 - start weight 290 pounds  . Nephrolithiasis   . Palpitations   . Panic disorder   . PCOS (polycystic ovarian syndrome)   . PONV (postoperative nausea and vomiting)   . Pregnancy induced hypertension   . Shortness of breath   . Vitamin D deficiency     Past Surgical History:  Procedure Laterality Date  . Quintella Reichert OSTEOTOMY Left 06/24/2017   Procedure: Ralene Bathe;  Surgeon: [name], DPM;  Location: [facility];  Service: Podiatry;  Laterality: Left;  . BUNIONECTOMY Left 06/24/2017   Procedure: Anthoney Harada;  Surgeon: [name], DPM;  Location: [facility];  Service: Podiatry;  Laterality: Left;  . CESAREAN SECTION     x 2  . CHOLECYSTECTOMY N/A 02/15/2019   Procedure: LAPAROSCOPIC CHOLECYSTECTOMY WITH INTRAOPERATIVE CHOLANGIOGRAM;  Surgeon: Andria Meuse, MD;  Location: [facility];  Service: General;  Laterality: N/A;  . FOOT SURGERY    . 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   Procedure: LAPAROSCOPIC ROUX-EN-Y GASTRIC;  Surgeon: Atilano Ina, MD;  Location: [facility];  Service: General;  Laterality: N/A;  laparoscopic roux-en-y gastric bypass  . LITHOTRIPSY Left   . TONSILLECTOMY    . TUBAL LIGATION    . UPPER GI ENDOSCOPY  08/24/2012   Procedure: UPPER GI ENDOSCOPY;  Surgeon: Atilano Ina, MD;  Location: [facility];  Service: General;;  . WISDOM TOOTH EXTRACTION       Current Outpatient Medications:  .  acetaminophen (TYLENOL) 500 MG tablet, Take 2 tablets (1,000 mg total) by mouth every 6 (six) hours as needed for moderate pain., Disp: 30 tablet, Rfl: 0 .  busPIRone (BUSPAR) 15 MG tablet, Take 15 mg by mouth 3 (three) times daily., Disp: , Rfl:  .  clonazePAM (KLONOPIN) 1 MG tablet, Take 0.5-1 tablets (0.5-1 mg total) by mouth 2 (two) times daily as needed for anxiety., Disp: 60 tablet, Rfl: 5 .  cyanocobalamin (,VITAMIN B-12,) 1000 MCG/ML injection, Inject 1 vial per week for 3 weeks, then 1 vial per month thereafter (Patient  taking differently: Inject 100 mcg into the skin once a week. Tuesdays), Disp: 6 mL, Rfl: 3 .  diclofenac Sodium (VOLTAREN) 1 % GEL, Apply 4 g topically 4 (four) times daily., Disp: 100 g, Rfl: 0 .  FIBER ADULT GUMMIES PO, Take by mouth., Disp: , Rfl:  .  hydrOXYzine (VISTARIL) 50 MG capsule, Take 50 mg by mouth every 6 (six) hours as needed., Disp: , Rfl:  .  ipratropium (ATROVENT) 0.06 % nasal spray, Place 2 sprays into both nostrils 4 (four) times daily., Disp: 15 mL, Rfl: 0 .  lamoTRIgine (LAMICTAL) 200 MG tablet, Take 200 mg by mouth daily., Disp: , Rfl:  .  lithium carbonate (LITHOBID) 300 MG CR tablet, Take 300 mg by mouth 2 (two) times daily., Disp: , Rfl:  .  Magnesium 250 MG TABS, Take by mouth., Disp: , Rfl:  .  metFORMIN (GLUCOPHAGE) 500 MG tablet, Take 500 mg by mouth 2 (two) times daily., Disp: , Rfl:  .  Multiple Vitamins-Iron (ONE-TABLET-DAILY/IRON PO), Take 1 tablet by mouth daily., Disp: , Rfl:  .  ondansetron (ZOFRAN ODT) 4 MG disintegrating tablet, 4mg  ODT q4 hours prn nausea/vomit, Disp: 10 tablet, Rfl: 0 .  pregabalin (LYRICA) 75 MG capsule, Take 1 capsule (75 mg total) by mouth 2 (two) times daily., Disp: 180 capsule, Rfl: 3 .  QUEtiapine (SEROQUEL) 400 MG tablet, Take 400 mg by mouth at bedtime. , Disp: , Rfl:  .  SYRINGE-NEEDLE, DISP, 3 ML 25G X 1" 3 ML MISC, Use 1 needle per application once weekly, Disp: 12 each, Rfl: 0 .  tizanidine (ZANAFLEX) 2 MG capsule, Take 2 capsules (4 mg total) by mouth 3 (three) times daily as needed for muscle spasms., Disp: 180 capsule, Rfl: 3 .  valACYclovir (VALTREX) 1000 MG tablet, Take 1 tablet (1,000 mg total) by mouth 3 (three) times daily., Disp: 21 tablet, Rfl: 0 .  Vitamin D, Ergocalciferol, (DRISDOL) 1.25 MG (50000 UT) CAPS capsule, Take 50,000 Units by mouth once a week. Tuesday, Disp: , Rfl:  .  benzonatate (TESSALON PERLES) 100 MG capsule, Take 1 capsule (100 mg total) by mouth 3 (three) times daily as needed., Disp: 20 capsule,  Rfl: 0 .  clotrimazole (MYCELEX) 10 MG troche, Take 1 tablet (10 mg total) by mouth 5 (five) times daily., Disp: 50 Troche, Rfl: 0  EXAM:  VITALS per patient if applicable:  GENERAL: alert, oriented, appears well and in no acute distress  HEENT: atraumatic, conjunttiva clear, no obvious abnormalities on inspection of external nose and ears, tongue looks mildly erythematous with small white patches on the lateral upper tongue and a little scattered on the rest of the tongue as well.  NECK: normal movements of the head and neck  LUNGS: on inspection no signs of respiratory distress, breathing rate appears normal, no obvious gross SOB, gasping or wheezing, occasional cough during the visit  CV: no obvious cyanosis  MS:  moves all visible extremities without noticeable abnormality  PSYCH/NEURO: pleasant and cooperative, no obvious depression or anxiety, speech and thought processing grossly intact  ASSESSMENT AND PLAN:  Discussed the following assessment and plan:  Cough  Nasal congestion  Tongue discoloration  -we discussed possible serious and likely etiologies, options for evaluation and workup, limitations of telemedicine visit vs in person visit, treatment, treatment risks and precautions. Pt prefers to treat via telemedicine empirically rather than in person at this moment.  For the respiratory symptoms, given she reported chest tightness, recommended in person evaluation.  However, she feels that is simply from her anxiety, as reports she always gets that when sick.  She denies any difficulty breathing, wheezing or chest pain.  Query likely viral upper respiratory infection as her husband has the same cold-like symptoms.  She had a negative Covid test and is fully vaccinated with also a booster.  She opted for symptomatic care with the cough medication, Tessalon Rx sent, nasal saline, plenty of fluids and agrees to seek in person care if any worsening or not improving over the next  day or so. For the tongue issues, this looks like thrush on exam.  She reports she has had thrush in the past and feels the atrocious she was using at home were helping, however she has a limited supply.  Sent clotrimazole troches and also discussed Orajel if needed to use before eating.  Work/School slipped offered:declined Scheduled follow up with PCP offered: Agrees to call if follow-up as needed  Advised to seek prompt in person care if worsening, new symptoms arise, or if is not improving with treatment. Discussed options for inperson care if PCP office not available. Did let this patient know that I only do telemedicine on Tuesdays and Thursdays for [place]. Advised to schedule follow up visit with PCP or [facility] if any further questions or concerns to avoid delays in care.   I discussed the assessment and treatment plan with the patient. The patient was provided an opportunity to ask questions and all were answered. The patient agreed with the plan and demonstrated an understanding of the instructions.     Terressa Koyanagi, DO

## 2020-04-12 MED FILL — CYANOCOBALAMIN 1,000 MCG/ML: 1000 | 84 days supply | Qty: 6 | Fill #2

## 2020-04-20 ENCOUNTER — Ambulatory Visit: Payer: BC Managed Care – PPO

## 2020-05-03 ENCOUNTER — Telehealth: Payer: Self-pay | Admitting: General Surgery

## 2020-05-03 ENCOUNTER — Encounter (HOSPITAL_COMMUNITY): Payer: Self-pay | Admitting: General Surgery

## 2020-05-03 ENCOUNTER — Other Ambulatory Visit: Payer: Self-pay | Admitting: General Surgery

## 2020-05-03 ENCOUNTER — Ambulatory Visit (HOSPITAL_COMMUNITY)
Admission: RE | Admit: 2020-05-03 | Discharge: 2020-05-03 | Disposition: A | Discharge status: Home / Self Care | Payer: BC Managed Care – PPO | Source: Ambulatory Visit | Attending: General Surgery | Admitting: General Surgery

## 2020-05-03 ENCOUNTER — Other Ambulatory Visit: Payer: Self-pay

## 2020-05-03 ENCOUNTER — Inpatient Hospital Stay (HOSPITAL_COMMUNITY)
Admission: AD | Admit: 2020-05-03 | Discharge: 2020-05-06 | DRG: 327 | Disposition: A | Discharge status: Home / Self Care | Payer: BC Managed Care – PPO | Source: Ambulatory Visit | Attending: Surgery | Admitting: Surgery

## 2020-05-03 ENCOUNTER — Other Ambulatory Visit (HOSPITAL_COMMUNITY): Payer: Self-pay | Admitting: General Surgery

## 2020-05-03 DIAGNOSIS — N979 Female infertility, unspecified: Secondary | ICD-10-CM | POA: Diagnosis present

## 2020-05-03 DIAGNOSIS — R112 Nausea with vomiting, unspecified: Secondary | ICD-10-CM | POA: Diagnosis not present

## 2020-05-03 DIAGNOSIS — Z8249 Family history of ischemic heart disease and other diseases of the circulatory system: Secondary | ICD-10-CM

## 2020-05-03 DIAGNOSIS — F988 Other specified behavioral and emotional disorders with onset usually occurring in childhood and adolescence: Secondary | ICD-10-CM | POA: Diagnosis present

## 2020-05-03 DIAGNOSIS — E860 Dehydration: Secondary | ICD-10-CM | POA: Diagnosis present

## 2020-05-03 DIAGNOSIS — Z818 Family history of other mental and behavioral disorders: Secondary | ICD-10-CM

## 2020-05-03 DIAGNOSIS — Z9884 Bariatric surgery status: Secondary | ICD-10-CM

## 2020-05-03 DIAGNOSIS — R109 Unspecified abdominal pain: Secondary | ICD-10-CM

## 2020-05-03 DIAGNOSIS — Z20822 Contact with and (suspected) exposure to covid-19: Secondary | ICD-10-CM | POA: Diagnosis present

## 2020-05-03 DIAGNOSIS — K561 Intussusception: Secondary | ICD-10-CM | POA: Diagnosis not present

## 2020-05-03 DIAGNOSIS — R1010 Upper abdominal pain, unspecified: Secondary | ICD-10-CM | POA: Diagnosis not present

## 2020-05-03 DIAGNOSIS — F5105 Insomnia due to other mental disorder: Secondary | ICD-10-CM | POA: Diagnosis not present

## 2020-05-03 DIAGNOSIS — F419 Anxiety disorder, unspecified: Secondary | ICD-10-CM | POA: Diagnosis not present

## 2020-05-03 DIAGNOSIS — Z888 Allergy status to other drugs, medicaments and biological substances status: Secondary | ICD-10-CM

## 2020-05-03 DIAGNOSIS — R1011 Right upper quadrant pain: Secondary | ICD-10-CM | POA: Diagnosis present

## 2020-05-03 DIAGNOSIS — F3181 Bipolar II disorder: Secondary | ICD-10-CM | POA: Diagnosis not present

## 2020-05-03 DIAGNOSIS — M797 Fibromyalgia: Secondary | ICD-10-CM | POA: Diagnosis present

## 2020-05-03 DIAGNOSIS — Z841 Family history of disorders of kidney and ureter: Secondary | ICD-10-CM

## 2020-05-03 DIAGNOSIS — K219 Gastro-esophageal reflux disease without esophagitis: Secondary | ICD-10-CM | POA: Diagnosis present

## 2020-05-03 DIAGNOSIS — E669 Obesity, unspecified: Secondary | ICD-10-CM | POA: Diagnosis present

## 2020-05-03 DIAGNOSIS — K9589 Other complications of other bariatric procedure: Secondary | ICD-10-CM | POA: Diagnosis not present

## 2020-05-03 DIAGNOSIS — Z83438 Family history of other disorder of lipoprotein metabolism and other lipidemia: Secondary | ICD-10-CM

## 2020-05-03 DIAGNOSIS — Z79899 Other long term (current) drug therapy: Secondary | ICD-10-CM

## 2020-05-03 DIAGNOSIS — Z800 Family history of malignant neoplasm of digestive organs: Secondary | ICD-10-CM

## 2020-05-03 DIAGNOSIS — K6389 Other specified diseases of intestine: Secondary | ICD-10-CM | POA: Diagnosis not present

## 2020-05-03 DIAGNOSIS — E282 Polycystic ovarian syndrome: Secondary | ICD-10-CM | POA: Diagnosis present

## 2020-05-03 DIAGNOSIS — D649 Anemia, unspecified: Secondary | ICD-10-CM | POA: Diagnosis not present

## 2020-05-03 DIAGNOSIS — R1110 Vomiting, unspecified: Secondary | ICD-10-CM | POA: Diagnosis not present

## 2020-05-03 DIAGNOSIS — Z886 Allergy status to analgesic agent status: Secondary | ICD-10-CM | POA: Diagnosis not present

## 2020-05-03 DIAGNOSIS — E739 Lactose intolerance, unspecified: Secondary | ICD-10-CM | POA: Diagnosis present

## 2020-05-03 DIAGNOSIS — Z833 Family history of diabetes mellitus: Secondary | ICD-10-CM

## 2020-05-03 DIAGNOSIS — E559 Vitamin D deficiency, unspecified: Secondary | ICD-10-CM | POA: Diagnosis not present

## 2020-05-03 DIAGNOSIS — E039 Hypothyroidism, unspecified: Secondary | ICD-10-CM | POA: Diagnosis present

## 2020-05-03 DIAGNOSIS — F339 Major depressive disorder, recurrent, unspecified: Secondary | ICD-10-CM | POA: Diagnosis present

## 2020-05-03 DIAGNOSIS — Z8049 Family history of malignant neoplasm of other genital organs: Secondary | ICD-10-CM

## 2020-05-03 HISTORY — DX: Intussusception: K56.1

## 2020-05-03 LAB — CBC WITH DIFFERENTIAL/PLATELET
Abs Immature Granulocytes: 0.03 10*3/uL (ref 0.00–0.07)
Basophils Absolute: 0.1 10*3/uL (ref 0.0–0.1)
Basophils Relative: 1 %
Eosinophils Absolute: 0.2 10*3/uL (ref 0.0–0.5)
Eosinophils Relative: 2 %
HCT: 36.7 % (ref 36.0–46.0)
Hemoglobin: 11.6 g/dL — ABNORMAL LOW (ref 12.0–15.0)
Immature Granulocytes: 0 %
Lymphocytes Relative: 22 %
Lymphs Abs: 1.9 10*3/uL (ref 0.7–4.0)
MCH: 26.4 pg (ref 26.0–34.0)
MCHC: 31.6 g/dL (ref 30.0–36.0)
MCV: 83.4 fL (ref 80.0–100.0)
Monocytes Absolute: 0.6 10*3/uL (ref 0.1–1.0)
Monocytes Relative: 7 %
Neutro Abs: 5.9 10*3/uL (ref 1.7–7.7)
Neutrophils Relative %: 68 %
Platelets: 339 10*3/uL (ref 150–400)
RBC: 4.4 MIL/uL (ref 3.87–5.11)
RDW: 15.2 % (ref 11.5–15.5)
WBC: 8.6 10*3/uL (ref 4.0–10.5)
nRBC: 0 % (ref 0.0–0.2)

## 2020-05-03 LAB — COMPREHENSIVE METABOLIC PANEL
ALT: 34 U/L (ref 0–44)
AST: 19 U/L (ref 15–41)
Albumin: 4 g/dL (ref 3.5–5.0)
Alkaline Phosphatase: 173 U/L — ABNORMAL HIGH (ref 38–126)
Anion gap: 11 (ref 5–15)
BUN: 12 mg/dL (ref 6–20)
CO2: 23 mmol/L (ref 22–32)
Calcium: 8.7 mg/dL — ABNORMAL LOW (ref 8.9–10.3)
Chloride: 104 mmol/L (ref 98–111)
Creatinine, Ser: 0.62 mg/dL (ref 0.44–1.00)
GFR, Estimated: >60 mL/min (ref >60)
Glucose, Bld: 85 mg/dL (ref 70–99)
Potassium: 4 mmol/L (ref 3.5–5.1)
Sodium: 138 mmol/L (ref 135–145)
Total Bilirubin: 0.5 mg/dL (ref 0.3–1.2)
Total Protein: 7 g/dL (ref 6.5–8.1)

## 2020-05-03 LAB — LIPASE, BLOOD: Lipase: 27 U/L (ref 11–51)

## 2020-05-03 LAB — PREALBUMIN: Prealbumin: 19.9 mg/dL (ref 18–38)

## 2020-05-03 LAB — MAGNESIUM: Magnesium: 2 mg/dL (ref 1.7–2.4)

## 2020-05-03 LAB — RESP PANEL BY RT-PCR (FLU A&B, COVID) ARPGX2
Influenza A by PCR: NEGATIVE
Influenza B by PCR: NEGATIVE
SARS Coronavirus 2 by RT PCR: NEGATIVE

## 2020-05-03 LAB — LACTIC ACID, PLASMA: Lactic Acid, Venous: 1.2 mmol/L (ref 0.5–1.9)

## 2020-05-03 LAB — PHOSPHORUS: Phosphorus: 3 mg/dL (ref 2.5–4.6)

## 2020-05-03 IMAGING — CT CT ABD-PELV W/ CM
2 of 4 series · 16 of 46 positions shown, 18 images · IV contrast (omnipaque)
Comparison: [DATE] stone study

CLINICAL DATA: Right upper quadrant pain with nausea and vomiting.
Cholecystectomy. Roux-en-Y gastric bypass 7 years ago.

EXAM:
CT ABDOMEN AND PELVIS WITH CONTRAST
TECHNIQUE: Multidetector CT imaging of the abdomen and pelvis was performed
using the standard protocol following bolus administration of
intravenous contrast.
CONTRAST:  100mL OMNIPAQUE IOHEXOL 300 MG/ML  SOLN

[Series 2: axial st · axial · 0.98mm/px · z∈[+239,+704]mm · 13 of 101 slices shown, 15 images]
[im 4/101  soft-tissue]
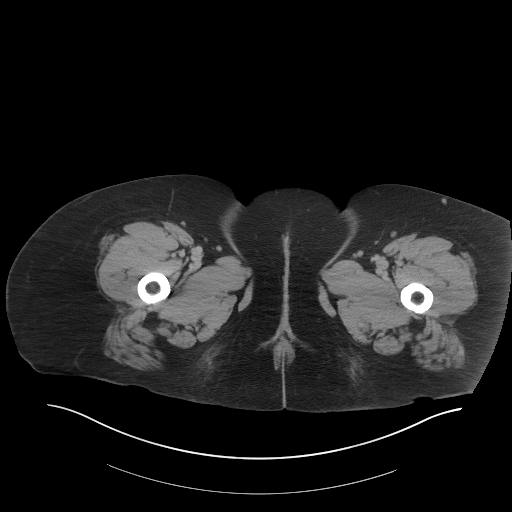
[im 4/101  bone]
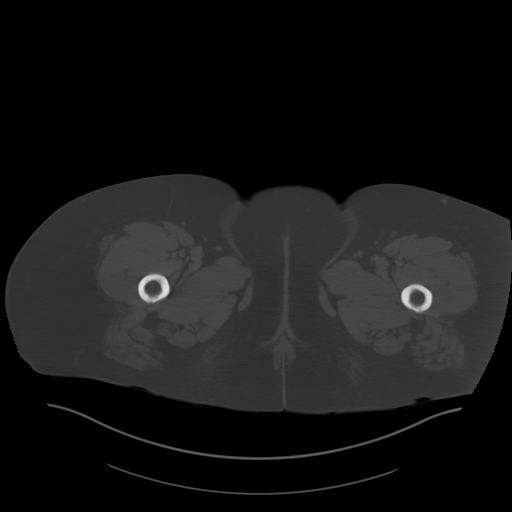
[im 12/101  soft-tissue]
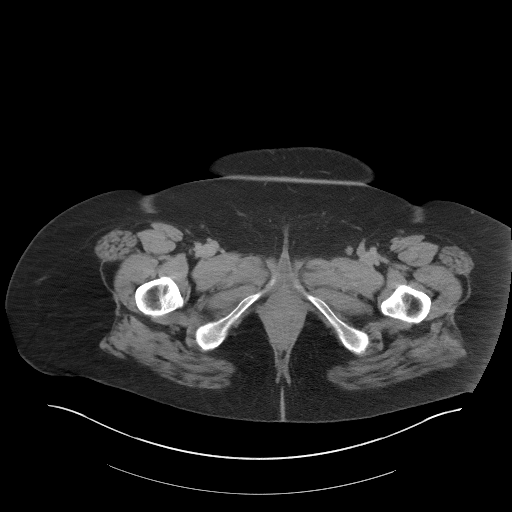
[im 20/101  soft-tissue]
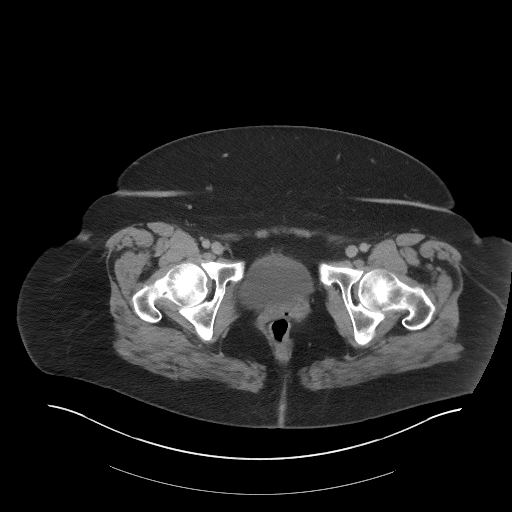
[im 27/101  soft-tissue]
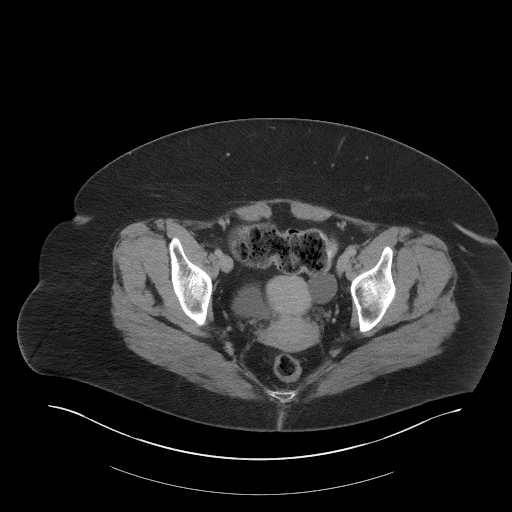
[im 35/101  soft-tissue]
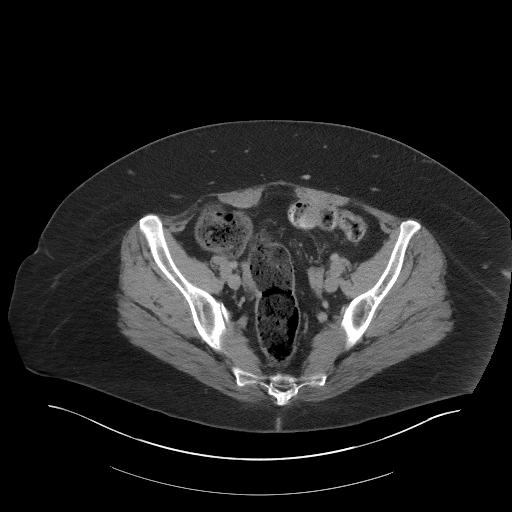
[im 43/101  soft-tissue]
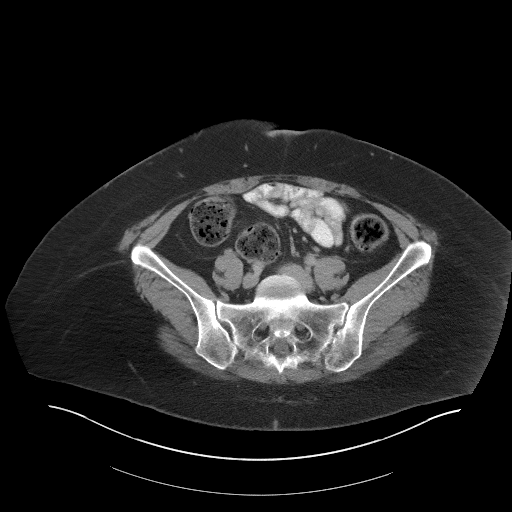
[im 51/101  soft-tissue]
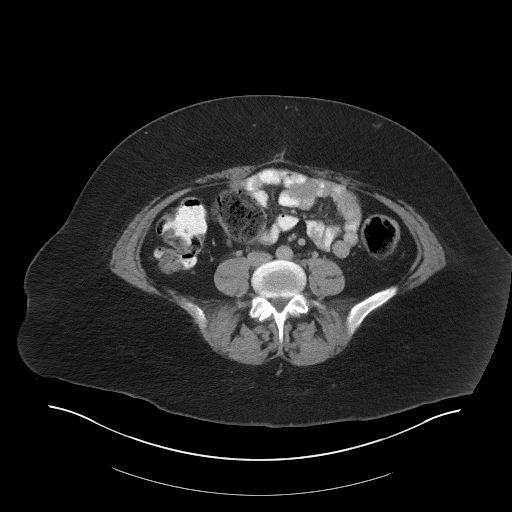
[im 58/101  soft-tissue]
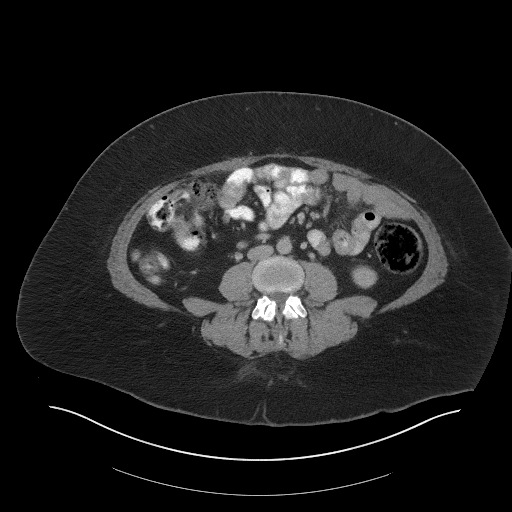
[im 66/101  soft-tissue]
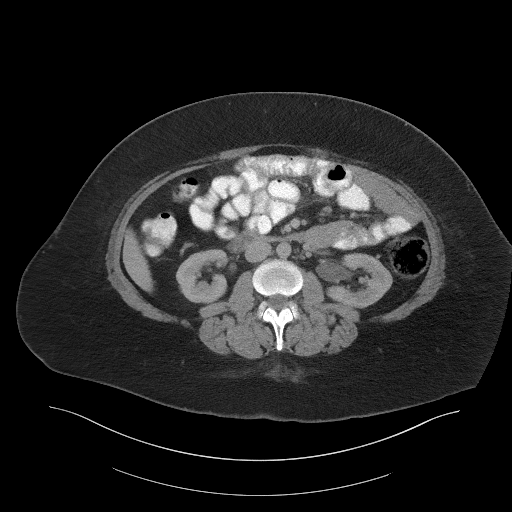
[im 66/101  bone]
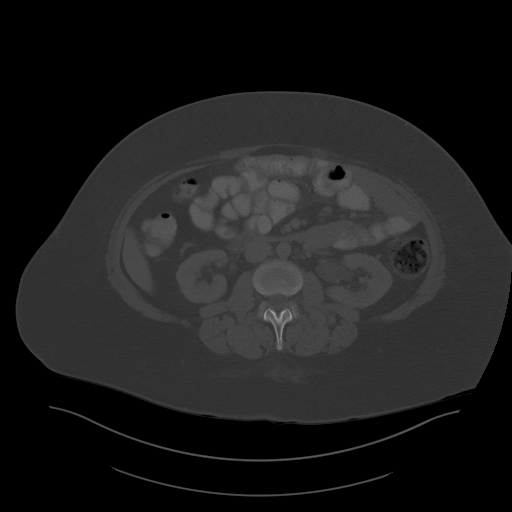
[im 74/101  soft-tissue]
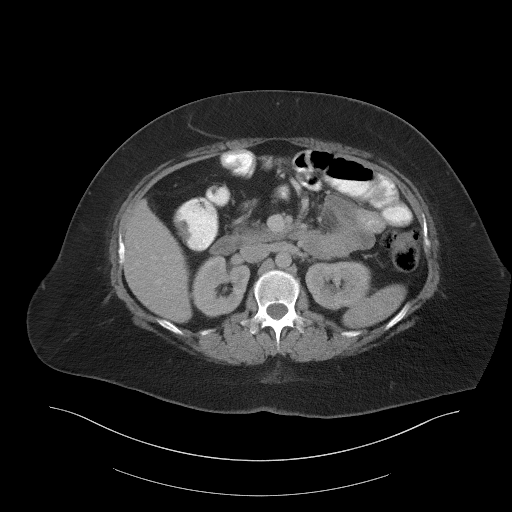
[im 81/101  soft-tissue]
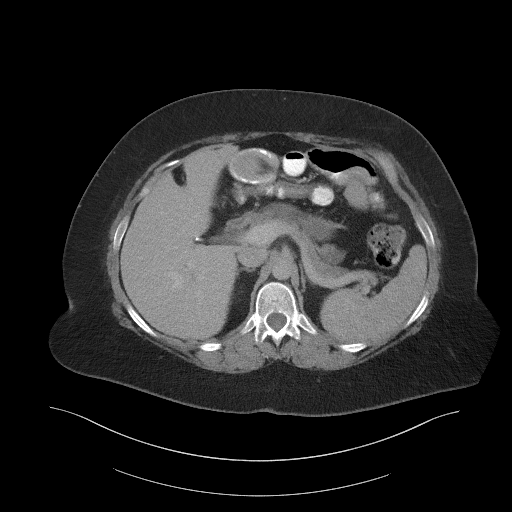
[im 89/101  soft-tissue]
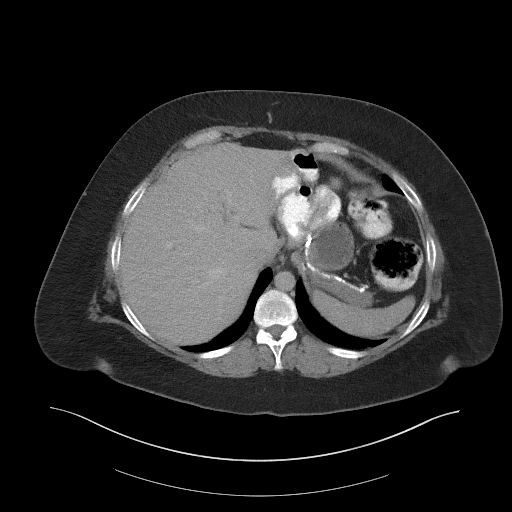
[im 97/101  soft-tissue]
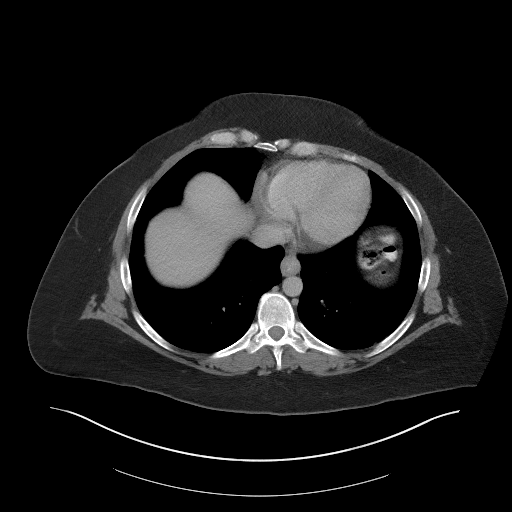

[Series 5: coronal st · coronal · 0.94mm/px · 3 of 117 slices shown]
[im 39/117  soft-tissue]
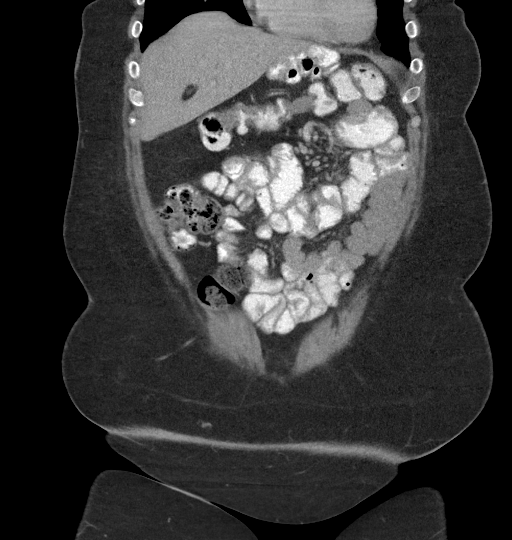
[im 52/117  soft-tissue]
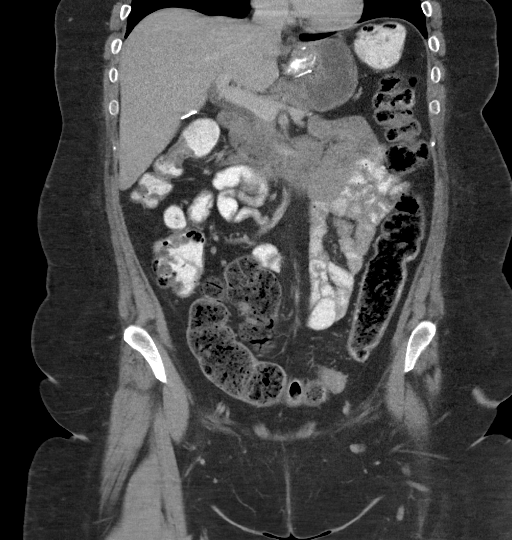
[im 65/117  soft-tissue]
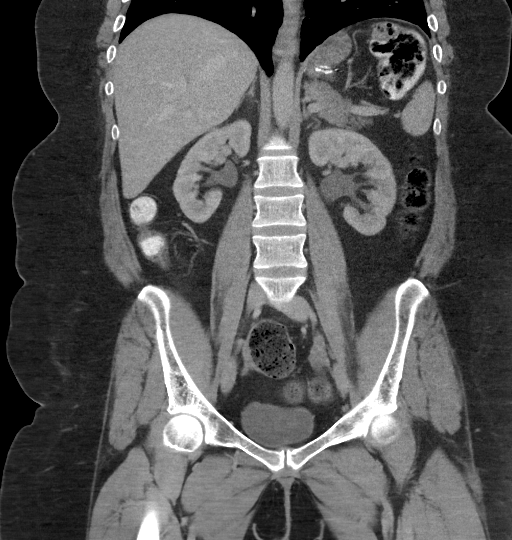

[16 of 46 positions shown; findings below may reference images not displayed]

FINDINGS: Lower chest: Mild left base scarring. Normal heart size without
pericardial or pleural effusion. Similar tiny hiatal hernia.

Hepatobiliary: Normal liver. Cholecystectomy, without biliary ductal
dilatation.

Pancreas: Normal, without mass or ductal dilatation.

Spleen: Normal in size, without focal abnormality.

Adrenals/Urinary Tract: Normal adrenal glands. Normal kidneys,
without hydronephrosis. Normal urinary bladder.

Stomach/Bowel: Status post antecolic Roux-en-Y gastric bypass.
Normal caliber of the bypassed stomach, without contrast within to
suggest gastric/gastric fistula.

The antecolic Roux loop is normal in caliber, including on [DATE].

A separate loop of small bowel is positioned anterior to the colon,
to the right of the Roux loop including on [DATE]. There is a
approximately 4.5 cm intussusception within this loop of small
bowel. Example [DATE] and coronal image [DATE]. No surrounding edema or
bowel wall thickening to confirm acute clinical significance.

Otherwise normal small bowel. Large distal stool volume, suggesting
constipation

Normal terminal ileum and appendix.

Vascular/Lymphatic: Normal caliber of the aorta and branch vessels.
No abdominopelvic adenopathy.

Reproductive: Normal uterus and adnexa.

Other: No significant free fluid.  No free intraperitoneal air.

Musculoskeletal: No acute osseous abnormality.
IMPRESSION: 1. Status post antecolic Roux-en-Y gastric bypass.
2. New small bowel loop positioned anterior to the transverse colon,
right of the normal appearing Roux loop. Small bowel/small bowel
intussusception within this small bowel loop. Findings could be
incidental and transient, given lack of surrounding edema or
proximal obstruction. Internal hernia and intussusception secondary
to lead point cannot be excluded. If patient's symptoms persist or
warrant, consider repeat with either CT enterography or abdominal CT
at 2-3 days.
3. Distal stool burden suggests constipation, which also may be the
explanation for right upper quadrant pain.
4.  Tiny hiatal hernia.

## 2020-05-03 MED ORDER — METHOCARBAMOL 1000 MG/10ML IJ SOLN
1000.0000 mg | Freq: Four times a day (QID) | INTRAVENOUS | Status: DC | PRN
  Filled 2020-05-03: qty 10

## 2020-05-03 MED ORDER — ONDANSETRON HCL 4 MG/2ML IJ SOLN: 4.0000 mg | Freq: Four times a day (QID) | INTRAMUSCULAR | Status: DC | PRN

## 2020-05-03 MED ORDER — ONDANSETRON HCL 4 MG/2ML IJ SOLN
4.0000 mg | INTRAMUSCULAR | Status: DC | PRN
  Administered 2020-05-03 – 2020-05-04 (×2): 4 mg via INTRAVENOUS
  Filled 2020-05-03 (×3): qty 2

## 2020-05-03 MED ORDER — LACTATED RINGERS IV BOLUS: 1000.0000 mL | Freq: Three times a day (TID) | INTRAVENOUS | Status: DC | PRN

## 2020-05-03 MED ORDER — ALUM & MAG HYDROXIDE-SIMETH 200-200-20 MG/5ML PO SUSP: 30.0000 mL | Freq: Four times a day (QID) | ORAL | Status: DC | PRN

## 2020-05-03 MED ORDER — LITHIUM CITRATE 300 MG/5 ML PO SYRP
200.0000 mg | Freq: Every day | ORAL | Status: DC
  Filled 2020-05-03: qty 3.3

## 2020-05-03 MED ORDER — HYDROCORTISONE 1 % EX CREA
1.0000 "application " | TOPICAL_CREAM | Freq: Three times a day (TID) | CUTANEOUS | Status: DC | PRN
  Filled 2020-05-03: qty 28

## 2020-05-03 MED ORDER — TIZANIDINE HCL 2 MG PO TABS
4.0000 mg | ORAL_TABLET | Freq: Three times a day (TID) | ORAL | Status: DC | PRN
  Filled 2020-05-03: qty 2

## 2020-05-03 MED ORDER — CLONAZEPAM 1 MG PO TABS
1.0000 mg | ORAL_TABLET | Freq: Two times a day (BID) | ORAL | Status: DC
  Administered 2020-05-03 – 2020-05-06 (×6): 1 mg via ORAL
  Filled 2020-05-03 (×7): qty 1

## 2020-05-03 MED ORDER — PANTOPRAZOLE SODIUM 40 MG IV SOLR
40.0000 mg | Freq: Every day | INTRAVENOUS | Status: DC
  Administered 2020-05-03 – 2020-05-05 (×3): 40 mg via INTRAVENOUS
  Filled 2020-05-03 (×3): qty 40

## 2020-05-03 MED ORDER — MORPHINE SULFATE (PF) 2 MG/ML IV SOLN
1.0000 mg | INTRAVENOUS | Status: DC | PRN
  Administered 2020-05-03: 1 mg via INTRAVENOUS
  Administered 2020-05-04 (×2): 2 mg via INTRAVENOUS
  Filled 2020-05-03 (×3): qty 1

## 2020-05-03 MED ORDER — BUSPIRONE HCL 5 MG PO TABS
15.0000 mg | ORAL_TABLET | Freq: Three times a day (TID) | ORAL | Status: DC
  Administered 2020-05-03 – 2020-05-06 (×8): 15 mg via ORAL
  Filled 2020-05-03 (×8): qty 3

## 2020-05-03 MED ORDER — SODIUM CHLORIDE 0.9 % IV SOLN: INTRAVENOUS | Status: DC

## 2020-05-03 MED ORDER — ONDANSETRON 4 MG PO TBDP
4.0000 mg | ORAL_TABLET | ORAL | Status: DC | PRN
  Administered 2020-05-04 – 2020-05-06 (×3): 4 mg via ORAL
  Filled 2020-05-03 (×4): qty 1

## 2020-05-03 MED ORDER — MENTHOL 3 MG MT LOZG: 1.0000 | LOZENGE | OROMUCOSAL | Status: DC | PRN

## 2020-05-03 MED ORDER — HYDROMORPHONE HCL 1 MG/ML IJ SOLN
0.5000 mg | INTRAMUSCULAR | Status: DC | PRN
  Administered 2020-05-03 – 2020-05-04 (×4): 1 mg via INTRAVENOUS
  Administered 2020-05-04: 2 mg via INTRAVENOUS
  Administered 2020-05-05 (×3): 1 mg via INTRAVENOUS
  Filled 2020-05-03 (×4): qty 1
  Filled 2020-05-03: qty 2
  Filled 2020-05-03 (×2): qty 1

## 2020-05-03 MED ORDER — PHENOL 1.4 % MT LIQD
1.0000 | OROMUCOSAL | Status: DC | PRN
  Filled 2020-05-03: qty 177

## 2020-05-03 MED ORDER — LIP MEDEX EX OINT
1.0000 "application " | TOPICAL_OINTMENT | Freq: Two times a day (BID) | CUTANEOUS | Status: DC
  Administered 2020-05-04 – 2020-05-06 (×5): 1 via TOPICAL
  Filled 2020-05-03: qty 7

## 2020-05-03 MED ORDER — SODIUM CHLORIDE 0.9 % IV BOLUS
1000.0000 mL | Freq: Once | INTRAVENOUS | Status: AC
  Administered 2020-05-03: 1000 mL via INTRAVENOUS

## 2020-05-03 MED ORDER — DIPHENHYDRAMINE HCL 50 MG/ML IJ SOLN: 12.5000 mg | Freq: Four times a day (QID) | INTRAMUSCULAR | Status: DC | PRN

## 2020-05-03 MED ORDER — ONDANSETRON 4 MG PO TBDP: 4.0000 mg | ORAL_TABLET | Freq: Four times a day (QID) | ORAL | Status: DC | PRN

## 2020-05-03 MED ORDER — ACETAMINOPHEN 650 MG RE SUPP: 650.0000 mg | Freq: Four times a day (QID) | RECTAL | Status: DC | PRN

## 2020-05-03 MED ORDER — DIPHENHYDRAMINE HCL 12.5 MG/5ML PO ELIX
12.5000 mg | ORAL_SOLUTION | Freq: Four times a day (QID) | ORAL | Status: DC | PRN
  Administered 2020-05-04 – 2020-05-05 (×2): 12.5 mg via ORAL
  Filled 2020-05-03 (×2): qty 5

## 2020-05-03 MED ORDER — HYDROCORTISONE (PERIANAL) 2.5 % EX CREA
1.0000 "application " | TOPICAL_CREAM | Freq: Four times a day (QID) | CUTANEOUS | Status: DC | PRN
  Filled 2020-05-03: qty 28.35

## 2020-05-03 MED ORDER — PROMETHAZINE HCL 25 MG/ML IJ SOLN
12.5000 mg | Freq: Four times a day (QID) | INTRAMUSCULAR | Status: DC | PRN
  Administered 2020-05-04 – 2020-05-05 (×5): 12.5 mg via INTRAVENOUS
  Filled 2020-05-03 (×5): qty 1

## 2020-05-03 MED ORDER — GUAIFENESIN-DM 100-10 MG/5ML PO SYRP: 10.0000 mL | ORAL_SOLUTION | ORAL | Status: DC | PRN

## 2020-05-03 MED ORDER — QUETIAPINE FUMARATE 200 MG PO TABS
400.0000 mg | ORAL_TABLET | Freq: Every day | ORAL | Status: DC
  Administered 2020-05-03 – 2020-05-05 (×3): 400 mg via ORAL
  Filled 2020-05-03: qty 8
  Filled 2020-05-03: qty 2
  Filled 2020-05-03: qty 8

## 2020-05-03 MED ORDER — METOPROLOL TARTRATE 5 MG/5ML IV SOLN: 5.0000 mg | Freq: Four times a day (QID) | INTRAVENOUS | Status: DC | PRN

## 2020-05-03 MED ORDER — HYDROXYZINE HCL 25 MG PO TABS: 50.0000 mg | ORAL_TABLET | Freq: Four times a day (QID) | ORAL | Status: DC | PRN

## 2020-05-03 MED ORDER — LAMOTRIGINE 100 MG PO TABS
300.0000 mg | ORAL_TABLET | Freq: Every day | ORAL | Status: DC
  Administered 2020-05-04 – 2020-05-06 (×3): 300 mg via ORAL
  Filled 2020-05-03 (×3): qty 3

## 2020-05-03 MED ORDER — SIMETHICONE 80 MG PO CHEW: 40.0000 mg | CHEWABLE_TABLET | Freq: Four times a day (QID) | ORAL | Status: DC | PRN

## 2020-05-03 MED ORDER — BARIUM SULFATE 2.1 % PO SUSP
ORAL | Status: AC
  Filled 2020-05-03: qty 2

## 2020-05-03 MED ORDER — IOHEXOL 300 MG/ML  SOLN
100.0000 mL | Freq: Once | INTRAMUSCULAR | Status: AC | PRN
  Administered 2020-05-03: 100 mL via INTRAVENOUS

## 2020-05-03 MED ORDER — ACETAMINOPHEN 10 MG/ML IV SOLN
1000.0000 mg | Freq: Three times a day (TID) | INTRAVENOUS | Status: DC
  Administered 2020-05-03 – 2020-05-04 (×2): 1000 mg via INTRAVENOUS
  Filled 2020-05-03 (×3): qty 100

## 2020-05-03 MED ORDER — THIAMINE HCL 100 MG/ML IJ SOLN
Freq: Once | INTRAVENOUS | Status: AC
  Filled 2020-05-03: qty 1000

## 2020-05-03 MED ORDER — THIAMINE HCL 100 MG/ML IJ SOLN: Freq: Once | INTRAVENOUS | Status: DC

## 2020-05-03 MED ORDER — PREGABALIN 75 MG PO CAPS
75.0000 mg | ORAL_CAPSULE | Freq: Two times a day (BID) | ORAL | Status: DC
  Administered 2020-05-03 – 2020-05-06 (×5): 75 mg via ORAL
  Filled 2020-05-03 (×5): qty 1

## 2020-05-03 MED ORDER — ENOXAPARIN SODIUM 40 MG/0.4ML ~~LOC~~ SOLN
40.0000 mg | SUBCUTANEOUS | Status: DC
  Administered 2020-05-04 – 2020-05-06 (×3): 40 mg via SUBCUTANEOUS
  Filled 2020-05-03 (×3): qty 0.4

## 2020-05-03 MED ORDER — BISACODYL 10 MG RE SUPP: 10.0000 mg | Freq: Two times a day (BID) | RECTAL | Status: DC | PRN

## 2020-05-03 NOTE — Progress Notes (Signed)
Pt seen & examined.   3 weeks of postprandial pain - pain within 10-15 min of eating. Initially with solids. Pain would be followed by n/v- vomiting what she ate.  No f/c. Eventually had same issues with liquids. See Dr Michaell Cowing' note.   Pt resting in bed Husband at [facility] Nontoxic Soft, old trocar incisions, TTP in RUQ/epigastrium, no rebound/peritonitis  H/o LRYGB 2014 H/o lap chole 3 weeks of RUQ pain/n/v  She does not appear to have a classic bariatric internal hernia.  She has what appears to be an area of small bowel intussusception within the Roux limb.  It is not causing obstruction.  Contrast passes the area.  I reviewed the CT with a different radiologist evening.  He feels that it is a transient finding.  He feels that it is perhaps a loop of small bowel tethered or attached to an adjacent loop and not necessarily a true intussusception.  If we did not have imaging I would say her symptoms are little bit more consistent with a marginal ulcer given almost immediate pain with oral intake.  She denies NSAID use or tobacco use.  She is very familiar with marginal ulcers because unfortunately her husband who was also had a Roux-en-Y gastric bypass has had persistent marginal ulcer on the jejunal aspect of his gastrojejunostomy and just underwent minimally invasive truncal vagotomy at [facility].   Her labs are pending  I do not believe she needs urgent exploration this evening. - agree with dr Michaell Cowing reasoning as well.   However I think she will need diagnostic laparoscopy and upper endoscopy tomorrow  Will review with additional bariatric surgeons in the morning and determine final plan  Dr. Michaell Cowing will follow up on her labs this evening IV fluids -avoid dextrose fluids initially given vomiting SCDs, Lovenox Home psychiatric medication  [name] Andrey Campanile, MD, FACS General, Bariatric, & Minimally Invasive Surgery [facility], [place]

## 2020-05-03 NOTE — Telephone Encounter (Signed)
Patient called into our office yesterday with complaints of periumbilical and right upper quadrant pain initially intermittent for the past [redacted] weeks along with nausea and vomiting of food she had eaten. Pt called to report, she is getting nauseated after eating any type of food. The nausea is followed by vomiting up everything she has eaten. This has been going to for two weeks. Zofran does not help.   Pt had mashed potatoes and fish today, within 10 minutes she had to vomit, emesis will be undigested food.   Nausea is associated with drinking fluids at times also.   Pt reports, she has hemorrhoids issues, but had a BM yesterday which was soft. Pt has a BM every couple of days.   Pt is having crampy type pain around her umbilical area this is consistent with nighttime eating.   Pt takes Protonix occasionally, no longer taking Carafate.  We ordered a CT scan and put in orders for patient to go for IV fluids.  Patient had CT scan just completed.  There was a loop of small bowel positioned anterior to the colon to the right of the Roux limb with intussusception.  There was no surrounding edema or bowel wall thickening.  She had a large stool burden otherwise no abnormality.  Small tiny hiatal hernia  I called the patient to see how she was doing this afternoon.  She states that she is no longer able to tolerate liquids.  She is still having discomfort.  She will still having nausea.  No fever or chills  At this point because she is unable to tolerate oral intake and because of the CT finding I think she needs to be admitted to the hospital for labs, IV fluids, bowel rest and pain control.  She will more than likely need a diagnostic laparoscopy.  Timing will depend on her exam on clinical presentation and labs.  We have requested a direct admit at [facility].  Our on-call surgeon will be alerted.  I will place admit orders.  All of this was discussed with the patient.  She was advised  to be on the look out for call from bed placement for [employer]. Andrey Campanile, MD, FACS General, Bariatric, & Minimally Invasive Surgery [facility], [place]

## 2020-05-03 NOTE — Plan of Care (Signed)
  Problem: Nutrition: Goal: Adequate nutrition will be maintained Outcome: Progressing   Problem: Pain Managment: Goal: General experience of comfort will improve Outcome: Progressing   Problem: Safety: Goal: Ability to remain free from injury will improve Outcome: Progressing   

## 2020-05-03 NOTE — H&P (Addendum)
[name] [name]  09-26-1985 [id number]  CARE TEAM:  PCP: Willow Ora, MD  Outpatient Care Team: Patient Care Team: Willow Ora, MD as PCP - General (Family Medicine) Jaymes Graff, MD as Referring Physician (Obstetrics and Gynecology) Himmelrich, Loree Fee, RD (Inactive) as Dietitian (Bariatrics) Hilts, Casimiro Needle, MD as Consulting Physician (Sports Medicine) Rachael Fee, MD as Attending Physician (Gastroenterology) Gaynelle Adu, MD as Consulting Physician (General Surgery)  Inpatient Treatment Team: Treatment Team: Attending Provider: Bishop Limbo, MD; Consulting Physician: Montez Morita, Md, MD; Technician: Diannia Ruder, [place]; Consulting Physician: Gaynelle Adu, MD   This patient is a 34 y.o.female who presents today for surgical evaluation at the request of Dr Andrey Campanile.   Chief complaint / Reason for evaluation: Upper abdominal pain.  34 year old woman.  History of morbid obesity with laparoscopic gastric bypass antecolic antigastric in 3 04/2013 Dr. Andrey Campanile.  Also had episode of cholecystitis needing laparoscopic cholecystectomy by our group last year.  Patient has been struggling with intermittent upper abdominal pain for the past several weeks.  She has been trying to help take care of her husband who had to have revision surgery at [facility].  Trying to ride it out.  However, she has been having dwindling tolerance of oral intake.  Cannot tolerate blenderized foods.  Even liquids are bothering her.  Intermittent nausea vomiting for the past several weeks.  Try to use Zofran but did not help.  Worsening discomfort with eating.  Because her husband was feeling more stable and she focused on her health and cannot turn things around.  Called discussed with her bariatric surgeon, Dr Andrey Campanile.  CT scan showed possible proximal small bowel intussusception.  Not obstructing or ischemic.  However not normal either.  Given her worsening oral intake and pain, recommendation made for  admission.  Patient at bedside sitting up with some discomfort.  Son and nurse in room.  Denies any sick contacts or travel history.  No personal nor family history of GI/colon cancer, inflammatory bowel disease, irritable bowel syndrome, allergy such as Celiac Sprue, dietary/dairy problems, colitis, ulcers nor gastritis.  No recent sick contacts/gastroenteritis.  No travel outside the country.  No changes in diet.  No dysphagia to solids or liquids.  No significant heartburn or reflux.  No hematochezia, hematemesis, coffee ground emesis.  No evidence of prior gastric/peptic ulceration.    Assessment  [name] [name]  34 y.o. female       Problem List:  Principal Problem:   Dehydration Active Problems:   Fibromyalgia   History of Roux-en-Y gastric bypass 2014   Morbid obesity (HCC)   Insomnia due to other mental disorder   Major depression, recurrent, chronic (HCC)   Bipolar 2 disorder (HCC)   Abdominal pain   Nausea & vomiting   Intussusception of small intestine (HCC)   Nausea vomiting and 3 weeks of abdominal pain with nonobstructing proximal small bowel intussusception of uncertain etiology.  Plan:  Admission  IV fluids.    IV nausea control.  IV pain control.   She could have some ulcer irritation but would not explain the small bowel intussusception.  Placed on IV Protonix.  Laboratory values and serial examinations.  She may benefit from repeat CT scan to see if this was physiologic or not vs Dx lap this admission.  It does seem not a very short segment which raises concerns.  It seems to be in an atypical location.  Not associated with the gastrojejunostomy nor jejunojejunostomy.  There is no evidence of an internal hernia to my view nor Dr. Tawana ScaleWilson's.  Again she is not obstructed with contrast going down.  She has a moderate stool burden. She is not toxic with nor peritonitis.  She does not have any hard indications of swelling, pneumatosis, or obstruction  warranting emergency surgery tonight.  Discussed with Dr. Andrey CampanileWilson & the patient.  Try and see if we can turn things around.  She noted she would like to avoidn a repeat CT scan.  I will have her discuss with the bariatric surgeons in the morning to determine if that would help rule out/rule in need for repeat surgery versus going ahead to proceed with diagnostic laparoscopy +/- EGD if she does not improve overnight.  Her symptoms have been going on for 3 weeks which argues against a acute ischemic/perforation event.  She did not have peritonitis.  She is afebrile and not tachycardic.  These are nonacute symptoms.  She normally is on a fair amount of chronic narcotic anxiety muscle relaxant medications which she has not been able to take with her nausea vomiting.  Some of her symptoms could be related to withdrawal although I am skeptical that answers everything.  Follow closely.   Patient and son and nurse Dr. Andrey CampanileWilson feel comfortable this plan.  -VTE prophylaxis- SCDs, etc -mobilize as tolerated to help recovery  35 minutes spent in review, evaluation, examination, counseling, and coordination of care.  More than 50% of that time was spent in counseling.  Ardeth SportsmanSteven C. Tammera Engert, MD, FACS, MASCRS Gastrointestinal and Minimally Invasive Surgery  [facility] [address] [place], [place] [id number] [phone] Fax [phone] Main/Paging  CONTACT INFORMATION: Weekday (9AM-5PM) concerns: Call [facility] main office at [phone] Weeknight (5PM-9AM) or Weekend/Holiday concerns: Check [email or web address] for General Surgery [facility] coverage (Please, do not use SecureChat as it is not reliable communication to operating surgeons for immediate patient care)      05/03/2020      Past Medical History:  Diagnosis Date  . ADD (attention deficit disorder)   . Anemia   . Anxiety   . B12 deficiency   . Back pain   . Bilateral swelling of feet   . Bipolar 2 disorder (HCC) 09/19/2019   . Constipation   . DEPRESSION   . Elevated cholesterol   . Family history of colon cancer 05/16/2019   Father, 4550s; deceased.   . Fatty liver   . Fibromyalgia   . Gallbladder problem   . GERD (gastroesophageal reflux disease)   . Heartburn   . History of stomach ulcers   . Hypertension   . Hypothyroidism    Hx of, normalized TSH during pregnancy 2011  . Infertility, female   . Lactose intolerance   . Migraines   . Morbid obesity (HCC)    s/p RY 08/2012 - start weight 290 pounds  . Nephrolithiasis   . Palpitations   . Panic disorder   . PCOS (polycystic ovarian syndrome)   . PONV (postoperative nausea and vomiting)   . Pregnancy induced hypertension   . Shortness of breath   . Vitamin D deficiency     Past Surgical History:  Procedure Laterality Date  . Quintella ReichertIKEN OSTEOTOMY Left 06/24/2017   Procedure: Ralene BatheAIKEN OSTEOTOMY;  Surgeon: Vivi BarrackWagoner, Matthew R, DPM;  Location: [facility];  Service: Podiatry;  Laterality: Left;  . BUNIONECTOMY Left 06/24/2017   Procedure: Anthoney HaradaALSTON BUNIONECTOMY;  Surgeon: Vivi BarrackWagoner, Matthew R, DPM;  Location: [facility];  Service: Podiatry;  Laterality: Left;  . CESAREAN SECTION     x 2  . CHOLECYSTECTOMY N/A 02/15/2019   Procedure: LAPAROSCOPIC CHOLECYSTECTOMY WITH INTRAOPERATIVE CHOLANGIOGRAM;  Surgeon: Andria Meuse, MD;  Location: [facility];  Service: General;  Laterality: N/A;  . FOOT SURGERY    . 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   Procedure: LAPAROSCOPIC ROUX-EN-Y GASTRIC;  Surgeon: Atilano Ina, MD;  Location: [facility];  Service: General;  Laterality: N/A;  laparoscopic roux-en-y gastric bypass  . LITHOTRIPSY Left   . TONSILLECTOMY    . TUBAL LIGATION    . UPPER GI ENDOSCOPY  08/24/2012   Procedure: UPPER GI ENDOSCOPY;  Surgeon: Atilano Ina, MD;  Location: [facility];  Service: General;;  . WISDOM TOOTH EXTRACTION      Social History   Socioeconomic History  . Marital status: Married    Spouse name: Not on file  . Number  of children: Not on file  . Years of education: Not on file  . Highest education level: Not on file  Occupational History  . Occupation: stay at home mom  Tobacco Use  . Smoking status: Never Smoker  . Smokeless tobacco: Never Used  Vaping Use  . Vaping Use: Never used  Substance and Sexual Activity  . Alcohol use: Yes    Comment: rare/socially  . Drug use: No  . Sexual activity: Not on file    Comment: tubal ligation  Other Topics Concern  . Not on file  Social History Narrative   Married, lives with spouse and dtr born 07/2009. Employed as Associate Professor at [employer]   Social Determinants of Health   Financial Resource Strain:   . Difficulty of Paying Living Expenses: Not on file  Food Insecurity:   . Worried About Programme researcher, broadcasting/film/video in the Last Year: Not on file  . Ran Out of Food in the Last Year: Not on file  Transportation Needs:   . Lack of Transportation (Medical): Not on file  . Lack of Transportation (Non-Medical): Not on file  Physical Activity:   . Days of Exercise per Week: Not on file  . Minutes of Exercise per Session: Not on file  Stress:   . Feeling of Stress : Not on file  Social Connections:   . Frequency of Communication with Friends and Family: Not on file  . Frequency of Social Gatherings with Friends and Family: Not on file  . Attends Religious Services: Not on file  . Active Member of Clubs or Organizations: Not on file  . Attends Banker Meetings: Not on file  . Marital Status: Not on file  Intimate Partner Violence:   . Fear of Current or Ex-Partner: Not on file  . Emotionally Abused: Not on file  . Physically Abused: Not on file  . Sexually Abused: Not on file    Family History  Problem Relation Age of Onset  . Hyperlipidemia Father   . Hypertension Father   . Kidney disease Father   . Colon cancer Father 58  . Cancer Father   . Depression Father   . Anxiety disorder Father   . Bipolar disorder Father   . Sleep apnea Father    . Obesity Father   . Hypertension Mother   . Depression Mother   . Anxiety disorder Mother   . Obesity Mother   . Hypertension Maternal Grandmother   . Uterine cancer Maternal Grandmother 87  . Diabetes Maternal Grandfather  Current Facility-Administered Medications  Medication Dose Route Frequency Provider Last Rate Last Admin  . 0.9 %  sodium chloride infusion   Intravenous Continuous Gaynelle Adu, MD      . acetaminophen (OFIRMEV) IV 1,000 mg  1,000 mg Intravenous Elpidio Eric, MD      . alum & mag hydroxide-simeth (MAALOX/MYLANTA) 200-200-20 MG/5ML suspension 30 mL  30 mL Oral Q6H PRN Karie Soda, MD      . bisacodyl (DULCOLAX) suppository 10 mg  10 mg Rectal Q12H PRN Karie Soda, MD      . busPIRone (BUSPAR) tablet 15 mg  15 mg Oral TID Gaynelle Adu, MD      . clonazePAM Scarlette Calico) tablet 1 mg  1 mg Oral BID Gaynelle Adu, MD      . diphenhydrAMINE (BENADRYL) 12.5 MG/5ML elixir 12.5 mg  12.5 mg Oral Q6H PRN Gaynelle Adu, MD       Or  . diphenhydrAMINE (BENADRYL) injection 12.5 mg  12.5 mg Intravenous Q6H PRN Gaynelle Adu, MD      . Melene Muller ON 05/04/2020] enoxaparin (LOVENOX) injection 40 mg  40 mg Subcutaneous Q24H Gaynelle Adu, MD      . guaiFENesin-dextromethorphan (ROBITUSSIN DM) 100-10 MG/5ML syrup 10 mL  10 mL Oral Q4H PRN Karie Soda, MD      . hydrocortisone (ANUSOL-HC) 2.5 % rectal cream 1 application  1 application Topical QID PRN Karie Soda, MD      . hydrocortisone cream 1 % 1 application  1 application Topical TID PRN Karie Soda, MD      . HYDROmorphone (DILAUDID) injection 0.5-2 mg  0.5-2 mg Intravenous Q2H PRN Karie Soda, MD      . hydrOXYzine (VISTARIL) capsule 50 mg  50 mg Oral Q6H PRN Gaynelle Adu, MD      . lactated ringers bolus 1,000 mL  1,000 mL Intravenous Q8H PRN Karie Soda, MD      . Melene Muller ON 05/04/2020] lamoTRIgine (LAMICTAL) tablet 300 mg  300 mg Oral Daily Gaynelle Adu, MD      . lip balm (CARMEX) ointment 1 application  1  application Topical BID Karie Soda, MD      . lithium citrate 300 MG/5ML solution 198 mg  198 mg Oral QHS Gaynelle Adu, MD      . menthol-cetylpyridinium (CEPACOL) lozenge 3 mg  1 lozenge Oral PRN Karie Soda, MD      . methocarbamol (ROBAXIN) 1,000 mg in dextrose 5 % 100 mL IVPB  1,000 mg Intravenous Q6H PRN Karie Soda, MD      . metoprolol tartrate (LOPRESSOR) injection 5 mg  5 mg Intravenous Q6H PRN Karie Soda, MD      . morphine 2 MG/ML injection 1-2 mg  1-2 mg Intravenous Q2H PRN Gaynelle Adu, MD      . ondansetron (ZOFRAN-ODT) disintegrating tablet 4 mg  4 mg Oral Q4H PRN Gaynelle Adu, MD       Or  . ondansetron [facility]) injection 4 mg  4 mg Intravenous Q4H PRN Gaynelle Adu, MD      . pantoprazole (PROTONIX) injection 40 mg  40 mg Intravenous QHS Gaynelle Adu, MD      . phenol (CHLORASEPTIC) mouth spray 1-2 spray  1-2 spray Mouth/Throat PRN Karie Soda, MD      . promethazine (PHENERGAN) injection 12.5 mg  12.5 mg Intravenous Q6H PRN Gaynelle Adu, MD      . QUEtiapine (SEROQUEL) tablet 400 mg  400 mg Oral QHS Gaynelle Adu, MD      .  simethicone (MYLICON) chewable tablet 40 mg  40 mg Oral Q6H PRN Gaynelle Adu, MD      . sodium chloride 0.9 % 1,000 mL with thiamine 100 mg, multivitamins adult 10 mL infusion   Intravenous Once Gaynelle Adu, MD      . sodium chloride 0.9 % bolus 1,000 mL  1,000 mL Intravenous Once Gaynelle Adu, MD       Facility-Administered Medications Ordered in Other Encounters  Medication Dose Route Frequency Provider Last Rate Last Admin  . Barium Sulfate 2.1 % SUSP              Allergies  Allergen Reactions  . Ambien [Zolpidem] Other (See Comments)    Sleep walking   . Nsaids     Gastric bypass    ROS:   All other systems reviewed & are negative except per HPI or as noted below: Constitutional:  No fevers, chills, sweats.  Weight stable Eyes:  No vision changes, No discharge HENT:  No sore throats, nasal drainage Lymph: No neck swelling, No  bruising easily Pulmonary:  No cough, productive sputum CV: No orthopnea, PND   No exertional chest/neck/shoulder/arm pain. GI:  No personal nor family history of GI/colon cancer, inflammatory bowel disease, irritable bowel syndrome, allergy such as Celiac Sprue, dietary/dairy problems, colitis, ulcers nor gastritis.  No recent sick contacts/gastroenteritis.  No travel outside the country.  No changes in diet. Renal: No UTIs, No hematuria Genital:  No drainage, bleeding, masses Musculoskeletal: No severe joint pain.  Good ROM major joints Skin:  No sores or lesions.  No rashes Heme/Lymph:  No easy bleeding.  No swollen lymph nodes Neuro: No focal weakness/numbness.  No seizures Psych: No suicidal ideation.  No hallucinations  BP (!) 149/101 (BP Location: Left Arm)   Pulse 89   Temp 98.2 F (36.8 C) (Oral)   Resp 16   LMP 05/03/2020   SpO2 98%   Physical Exam: Constitutional: Not cachectic.  Hygeine adequate.  Vitals signs as above.  Sitting up in bed.  Uncomfortable but not toxic.  Not sickly.   Eyes: Pupils reactive, normal extraocular movements. Sclera nonicteric Neuro: CN II-XII intact.  No major focal sensory defects.  No major motor deficits. Lymph: No head/neck/groin lymphadenopathy Psych:  No severe agitation.  No severe anxiety.  Judgment & insight Adequate, Oriented x4, HENT: Normocephalic, Mucus membranes moist.  No thrush.   Neck: Supple, No tracheal deviation.  No obvious thyromegaly Chest: No pain to chest wall compression.  Good respiratory excursion.  No audible wheezing CV:  Pulses intact.  Regular rhythm.  No major extremity edema Abdomen:  Soft.  Nondistended.  Tenderness at Right side of abdomen especially right upper quadrant and epigastric more than right flank regions.  No guarding or peritonitis.  Mild discomfort with cough but no major change.. No incarcerated hernias.  No hepatomegaly.  No splenomegaly Gen:  No inguinal hernias.  No inguinal lymphadenopathy.    Ext: No obvious deformity or contracture no significant edema.  No cyanosis Skin: No major subcutaneous nodules.  Warm and dry Musculoskeletal: Severe joint rigidity not present.  No obvious clubbing.  No digital petechiae.     Results:   Labs: No results found for this or any previous visit (from the past 48 hour(s)).  Imaging / Studies: CT ABDOMEN PELVIS W CONTRAST  Result Date: 05/03/2020 CLINICAL DATA:  Right upper quadrant pain with nausea and vomiting. Cholecystectomy. Roux-en-Y gastric bypass 7 years ago. EXAM: CT ABDOMEN AND PELVIS WITH CONTRAST  TECHNIQUE: Multidetector CT imaging of the abdomen and pelvis was performed using the standard protocol following bolus administration of intravenous contrast. CONTRAST:  OMNIPAQUE IOHEXOL 300 MG/ML  SOLN COMPARISON:  10/13/2019 stone study FINDINGS: Lower chest: Mild left base scarring. Normal heart size without pericardial or pleural effusion. Similar tiny hiatal hernia. Hepatobiliary: Normal liver. Cholecystectomy, without biliary ductal dilatation. Pancreas: Normal, without mass or ductal dilatation. Spleen: Normal in size, without focal abnormality. Adrenals/Urinary Tract: Normal adrenal glands. Normal kidneys, without hydronephrosis. Normal urinary bladder. Stomach/Bowel: Status post antecolic Roux-en-Y gastric bypass. Normal caliber of the bypassed stomach, without contrast within to suggest gastric/gastric fistula. The antecolic Roux loop is normal in caliber, including on 19/2. A separate loop of small bowel is positioned anterior to the colon, to the right of the Roux loop including on 19/2. There is a approximately 4.5 cm intussusception within this loop of small bowel. Example 19/2 and coronal image 29/5. No surrounding edema or bowel wall thickening to confirm acute clinical significance. Otherwise normal small bowel. Large distal stool volume, suggesting constipation Normal terminal ileum and appendix. Vascular/Lymphatic: Normal  caliber of the aorta and branch vessels. No abdominopelvic adenopathy. Reproductive: Normal uterus and adnexa. Other: No significant free fluid.  No free intraperitoneal air. Musculoskeletal: No acute osseous abnormality. IMPRESSION: 1. Status post antecolic Roux-en-Y gastric bypass. 2. New small bowel loop positioned anterior to the transverse colon, right of the normal appearing Roux loop. Small bowel/small bowel intussusception within this small bowel loop. Findings could be incidental and transient, given lack of surrounding edema or proximal obstruction. Internal hernia and intussusception secondary to lead point cannot be excluded. If patient's symptoms persist or warrant, consider repeat with either CT enterography or abdominal CT at 2-3 days. 3. Distal stool burden suggests constipation, which also may be the explanation for right upper quadrant pain. 4.  Tiny hiatal hernia. Electronically Signed   By: Jeronimo Greaves M.D.   On: 05/03/2020 15:31    Medications / Allergies: per chart  Antibiotics: Anti-infectives (From admission, onward)   None        Note: Portions of this report may have been transcribed using voice recognition software. Every effort was made to ensure accuracy; however, inadvertent computerized transcription errors may be present.   Any transcriptional errors that result from this process are unintentional.    Ardeth Sportsman, MD, FACS, MASCRS Gastrointestinal and Minimally Invasive Surgery  [facility] [address], [address] [place], [place] [address] [phone] Fax [phone] Main/Paging  CONTACT INFORMATION: Weekday (9AM-5PM) concerns: Call [facility] main office at [phone] Weeknight (5PM-9AM) or Weekend/Holiday concerns: Check [email or web address] for General Surgery [facility] coverage (Please, do not use SecureChat as it is not reliable communication to operating surgeons for immediate patient care)      05/03/2020  6:32 PM

## 2020-05-04 ENCOUNTER — Observation Stay (HOSPITAL_COMMUNITY): Payer: BC Managed Care – PPO | Admitting: Certified Registered Nurse Anesthetist

## 2020-05-04 ENCOUNTER — Encounter (HOSPITAL_COMMUNITY): Admission: AD | Disposition: A | Discharge status: Home / Self Care | Payer: Self-pay | Source: Ambulatory Visit

## 2020-05-04 ENCOUNTER — Encounter (HOSPITAL_COMMUNITY): Admission: RE | Admit: 2020-05-04 | Payer: BC Managed Care – PPO | Source: Ambulatory Visit

## 2020-05-04 DIAGNOSIS — E860 Dehydration: Secondary | ICD-10-CM | POA: Diagnosis present

## 2020-05-04 DIAGNOSIS — R112 Nausea with vomiting, unspecified: Secondary | ICD-10-CM | POA: Diagnosis present

## 2020-05-04 DIAGNOSIS — Z886 Allergy status to analgesic agent status: Secondary | ICD-10-CM | POA: Diagnosis not present

## 2020-05-04 DIAGNOSIS — Z841 Family history of disorders of kidney and ureter: Secondary | ICD-10-CM | POA: Diagnosis not present

## 2020-05-04 DIAGNOSIS — K219 Gastro-esophageal reflux disease without esophagitis: Secondary | ICD-10-CM | POA: Diagnosis present

## 2020-05-04 DIAGNOSIS — R1011 Right upper quadrant pain: Secondary | ICD-10-CM | POA: Diagnosis present

## 2020-05-04 DIAGNOSIS — E039 Hypothyroidism, unspecified: Secondary | ICD-10-CM | POA: Diagnosis present

## 2020-05-04 DIAGNOSIS — D649 Anemia, unspecified: Secondary | ICD-10-CM | POA: Diagnosis present

## 2020-05-04 DIAGNOSIS — N979 Female infertility, unspecified: Secondary | ICD-10-CM | POA: Diagnosis present

## 2020-05-04 DIAGNOSIS — Z20822 Contact with and (suspected) exposure to covid-19: Secondary | ICD-10-CM | POA: Diagnosis present

## 2020-05-04 DIAGNOSIS — Z800 Family history of malignant neoplasm of digestive organs: Secondary | ICD-10-CM | POA: Diagnosis not present

## 2020-05-04 DIAGNOSIS — E282 Polycystic ovarian syndrome: Secondary | ICD-10-CM | POA: Diagnosis present

## 2020-05-04 DIAGNOSIS — K6389 Other specified diseases of intestine: Secondary | ICD-10-CM | POA: Diagnosis not present

## 2020-05-04 DIAGNOSIS — F988 Other specified behavioral and emotional disorders with onset usually occurring in childhood and adolescence: Secondary | ICD-10-CM | POA: Diagnosis present

## 2020-05-04 DIAGNOSIS — K9589 Other complications of other bariatric procedure: Secondary | ICD-10-CM | POA: Diagnosis not present

## 2020-05-04 DIAGNOSIS — Z83438 Family history of other disorder of lipoprotein metabolism and other lipidemia: Secondary | ICD-10-CM | POA: Diagnosis not present

## 2020-05-04 DIAGNOSIS — E669 Obesity, unspecified: Secondary | ICD-10-CM | POA: Diagnosis present

## 2020-05-04 DIAGNOSIS — Z79899 Other long term (current) drug therapy: Secondary | ICD-10-CM | POA: Diagnosis not present

## 2020-05-04 DIAGNOSIS — E739 Lactose intolerance, unspecified: Secondary | ICD-10-CM | POA: Diagnosis present

## 2020-05-04 DIAGNOSIS — K561 Intussusception: Secondary | ICD-10-CM | POA: Diagnosis present

## 2020-05-04 DIAGNOSIS — M797 Fibromyalgia: Secondary | ICD-10-CM | POA: Diagnosis present

## 2020-05-04 DIAGNOSIS — F3181 Bipolar II disorder: Secondary | ICD-10-CM | POA: Diagnosis present

## 2020-05-04 DIAGNOSIS — Z9884 Bariatric surgery status: Secondary | ICD-10-CM | POA: Diagnosis not present

## 2020-05-04 DIAGNOSIS — F5105 Insomnia due to other mental disorder: Secondary | ICD-10-CM | POA: Diagnosis present

## 2020-05-04 DIAGNOSIS — F419 Anxiety disorder, unspecified: Secondary | ICD-10-CM | POA: Diagnosis present

## 2020-05-04 DIAGNOSIS — Z888 Allergy status to other drugs, medicaments and biological substances status: Secondary | ICD-10-CM | POA: Diagnosis not present

## 2020-05-04 DIAGNOSIS — Z8249 Family history of ischemic heart disease and other diseases of the circulatory system: Secondary | ICD-10-CM | POA: Diagnosis not present

## 2020-05-04 HISTORY — PX: COLON RESECTION: SHX5231

## 2020-05-04 LAB — CBC
HCT: 32.5 % — ABNORMAL LOW (ref 36.0–46.0)
Hemoglobin: 10.2 g/dL — ABNORMAL LOW (ref 12.0–15.0)
MCH: 26.5 pg (ref 26.0–34.0)
MCHC: 31.4 g/dL (ref 30.0–36.0)
MCV: 84.4 fL (ref 80.0–100.0)
Platelets: 258 10*3/uL (ref 150–400)
RBC: 3.85 MIL/uL — ABNORMAL LOW (ref 3.87–5.11)
RDW: 15.2 % (ref 11.5–15.5)
WBC: 5.9 10*3/uL (ref 4.0–10.5)
nRBC: 0 % (ref 0.0–0.2)

## 2020-05-04 LAB — BASIC METABOLIC PANEL
Anion gap: 4 — ABNORMAL LOW (ref 5–15)
BUN: 7 mg/dL (ref 6–20)
CO2: 24 mmol/L (ref 22–32)
Calcium: 7.8 mg/dL — ABNORMAL LOW (ref 8.9–10.3)
Chloride: 109 mmol/L (ref 98–111)
Creatinine, Ser: 0.65 mg/dL (ref 0.44–1.00)
GFR, Estimated: >60 mL/min (ref >60)
Glucose, Bld: 82 mg/dL (ref 70–99)
Potassium: 3.5 mmol/L (ref 3.5–5.1)
Sodium: 137 mmol/L (ref 135–145)

## 2020-05-04 LAB — TYPE AND SCREEN
ABO/RH(D): O NEG
Antibody Screen: NEGATIVE

## 2020-05-04 SURGERY — COLON RESECTION LAPAROSCOPIC
Anesthesia: General | Site: Abdomen

## 2020-05-04 MED ORDER — PROPOFOL 10 MG/ML IV BOLUS
INTRAVENOUS | Status: AC
  Filled 2020-05-04: qty 20

## 2020-05-04 MED ORDER — 0.9 % SODIUM CHLORIDE (POUR BTL) OPTIME
TOPICAL | Status: DC | PRN
  Administered 2020-05-04: 2000 mL

## 2020-05-04 MED ORDER — HYDROMORPHONE HCL 1 MG/ML IJ SOLN
INTRAMUSCULAR | Status: AC
  Filled 2020-05-04: qty 1

## 2020-05-04 MED ORDER — FENTANYL CITRATE (PF) 250 MCG/5ML IJ SOLN
INTRAMUSCULAR | Status: AC
  Filled 2020-05-04: qty 5

## 2020-05-04 MED ORDER — PROMETHAZINE HCL 25 MG/ML IJ SOLN
INTRAMUSCULAR | Status: AC
  Administered 2020-05-04: 6.25 mg via INTRAVENOUS
  Filled 2020-05-04: qty 1

## 2020-05-04 MED ORDER — ACETAMINOPHEN 500 MG PO TABS
ORAL_TABLET | ORAL | Status: AC
  Filled 2020-05-04: qty 2

## 2020-05-04 MED ORDER — ACETAMINOPHEN 500 MG PO TABS
1000.0000 mg | ORAL_TABLET | Freq: Three times a day (TID) | ORAL | Status: DC
  Administered 2020-05-04 – 2020-05-05 (×4): 1000 mg via ORAL
  Filled 2020-05-04 (×5): qty 2

## 2020-05-04 MED ORDER — BUPIVACAINE HCL (PF) 0.25 % IJ SOLN
INTRAMUSCULAR | Status: DC | PRN
  Administered 2020-05-04: 20 mL

## 2020-05-04 MED ORDER — LIDOCAINE 2% (20 MG/ML) 5 ML SYRINGE
INTRAMUSCULAR | Status: DC | PRN
  Administered 2020-05-04: 60 mg via INTRAVENOUS

## 2020-05-04 MED ORDER — LIDOCAINE 2% (20 MG/ML) 5 ML SYRINGE
INTRAMUSCULAR | Status: AC
  Filled 2020-05-04: qty 5

## 2020-05-04 MED ORDER — BUPIVACAINE HCL 0.25 % IJ SOLN
INTRAMUSCULAR | Status: AC
  Filled 2020-05-04: qty 1

## 2020-05-04 MED ORDER — PROMETHAZINE HCL 25 MG/ML IJ SOLN: 6.2500 mg | INTRAMUSCULAR | Status: DC | PRN

## 2020-05-04 MED ORDER — ONDANSETRON HCL 4 MG/2ML IJ SOLN
INTRAMUSCULAR | Status: DC | PRN
  Administered 2020-05-04: 4 mg via INTRAVENOUS

## 2020-05-04 MED ORDER — SUCCINYLCHOLINE CHLORIDE 200 MG/10ML IV SOSY
PREFILLED_SYRINGE | INTRAVENOUS | Status: DC | PRN
  Administered 2020-05-04: 160 mg via INTRAVENOUS

## 2020-05-04 MED ORDER — MIDAZOLAM HCL 2 MG/2ML IJ SOLN
INTRAMUSCULAR | Status: AC
  Filled 2020-05-04: qty 2

## 2020-05-04 MED ORDER — ROCURONIUM BROMIDE 10 MG/ML (PF) SYRINGE
PREFILLED_SYRINGE | INTRAVENOUS | Status: AC
  Filled 2020-05-04: qty 10

## 2020-05-04 MED ORDER — DEXAMETHASONE SODIUM PHOSPHATE 4 MG/ML IJ SOLN
INTRAMUSCULAR | Status: DC | PRN
  Administered 2020-05-04: 10 mg via INTRAVENOUS

## 2020-05-04 MED ORDER — ACETAMINOPHEN 10 MG/ML IV SOLN: 1000.0000 mg | Freq: Once | INTRAVENOUS | Status: DC | PRN

## 2020-05-04 MED ORDER — ESMOLOL HCL 100 MG/10ML IV SOLN
INTRAVENOUS | Status: DC | PRN
  Administered 2020-05-04 (×2): 15 mg via INTRAVENOUS

## 2020-05-04 MED ORDER — ROCURONIUM BROMIDE 10 MG/ML (PF) SYRINGE
PREFILLED_SYRINGE | INTRAVENOUS | Status: DC | PRN
  Administered 2020-05-04: 20 mg via INTRAVENOUS
  Administered 2020-05-04: 50 mg via INTRAVENOUS

## 2020-05-04 MED ORDER — LITHIUM CARBONATE ER 300 MG PO TBCR
600.0000 mg | EXTENDED_RELEASE_TABLET | Freq: Every day | ORAL | Status: DC
  Administered 2020-05-04 – 2020-05-05 (×2): 600 mg via ORAL
  Filled 2020-05-04 (×2): qty 2

## 2020-05-04 MED ORDER — CEFAZOLIN SODIUM-DEXTROSE 2-4 GM/100ML-% IV SOLN
2.0000 g | INTRAVENOUS | Status: AC
  Administered 2020-05-04: 2 g via INTRAVENOUS
  Filled 2020-05-04: qty 100

## 2020-05-04 MED ORDER — LACTATED RINGERS IV SOLN: INTRAVENOUS | Status: DC

## 2020-05-04 MED ORDER — ACETAMINOPHEN 10 MG/ML IV SOLN
INTRAVENOUS | Status: AC
  Administered 2020-05-04: 1000 mg via INTRAVENOUS
  Filled 2020-05-04: qty 100

## 2020-05-04 MED ORDER — FENTANYL CITRATE (PF) 100 MCG/2ML IJ SOLN
INTRAMUSCULAR | Status: DC | PRN
  Administered 2020-05-04 (×5): 50 ug via INTRAVENOUS

## 2020-05-04 MED ORDER — SUCCINYLCHOLINE CHLORIDE 200 MG/10ML IV SOSY
PREFILLED_SYRINGE | INTRAVENOUS | Status: AC
  Filled 2020-05-04: qty 10

## 2020-05-04 MED ORDER — HYDROMORPHONE HCL 1 MG/ML IJ SOLN
0.2500 mg | INTRAMUSCULAR | Status: DC | PRN
  Administered 2020-05-04: 0.5 mg via INTRAVENOUS

## 2020-05-04 MED ORDER — SUGAMMADEX SODIUM 200 MG/2ML IV SOLN
INTRAVENOUS | Status: DC | PRN
  Administered 2020-05-04: 500 mg via INTRAVENOUS

## 2020-05-04 MED ORDER — DEXAMETHASONE SODIUM PHOSPHATE 10 MG/ML IJ SOLN
INTRAMUSCULAR | Status: AC
  Filled 2020-05-04: qty 1

## 2020-05-04 MED ORDER — PROPOFOL 10 MG/ML IV BOLUS
INTRAVENOUS | Status: DC | PRN
  Administered 2020-05-04: 200 mg via INTRAVENOUS

## 2020-05-04 MED ORDER — MEPERIDINE HCL 50 MG/ML IJ SOLN: 6.2500 mg | INTRAMUSCULAR | Status: DC | PRN

## 2020-05-04 MED ORDER — ONDANSETRON HCL 4 MG/2ML IJ SOLN
INTRAMUSCULAR | Status: AC
  Filled 2020-05-04: qty 2

## 2020-05-04 MED ORDER — MIDAZOLAM HCL 5 MG/5ML IJ SOLN
INTRAMUSCULAR | Status: DC | PRN
  Administered 2020-05-04: 2 mg via INTRAVENOUS

## 2020-05-04 MED ORDER — OXYCODONE HCL 5 MG PO TABS
5.0000 mg | ORAL_TABLET | ORAL | Status: DC | PRN
  Administered 2020-05-04 – 2020-05-06 (×6): 10 mg via ORAL
  Filled 2020-05-04 (×6): qty 2

## 2020-05-04 SURGICAL SUPPLY — 75 items
APPLIER CLIP 5 13 M/L LIGAMAX5 (MISCELLANEOUS)
APPLIER CLIP ROT 10 11.4 M/L (STAPLE)
APPLIER CLIP ROT 13.4 12 LRG (CLIP) ×2
BLADE EXTENDED COATED 6.5IN (ELECTRODE) IMPLANT
CABLE HIGH FREQUENCY MONO STRZ (ELECTRODE) IMPLANT
CELLS DAT CNTRL 66122 CELL SVR (MISCELLANEOUS) IMPLANT
CLIP APPLIE 5 13 M/L LIGAMAX5 (MISCELLANEOUS) IMPLANT
CLIP APPLIE ROT 10 11.4 M/L (STAPLE) IMPLANT
CLIP APPLIE ROT 13.4 12 LRG (CLIP) ×1 IMPLANT
COVER SURGICAL LIGHT HANDLE (MISCELLANEOUS) ×4 IMPLANT
COVER WAND RF STERILE (DRAPES) IMPLANT
DECANTER SPIKE VIAL GLASS SM (MISCELLANEOUS) ×2 IMPLANT
DERMABOND ADVANCED (GAUZE/BANDAGES/DRESSINGS) ×1
DERMABOND ADVANCED .7 DNX12 (GAUZE/BANDAGES/DRESSINGS) ×1 IMPLANT
DRAIN CHANNEL 19F RND (DRAIN) IMPLANT
DRSG OPSITE POSTOP 4X10 (GAUZE/BANDAGES/DRESSINGS) IMPLANT
DRSG OPSITE POSTOP 4X6 (GAUZE/BANDAGES/DRESSINGS) IMPLANT
DRSG OPSITE POSTOP 4X8 (GAUZE/BANDAGES/DRESSINGS) IMPLANT
ELECT REM PT RETURN 15FT ADLT (MISCELLANEOUS) ×2 IMPLANT
ENDOLOOP SUT PDS II  0 18 (SUTURE)
ENDOLOOP SUT PDS II 0 18 (SUTURE) IMPLANT
GAUZE SPONGE 4X4 12PLY STRL (GAUZE/BANDAGES/DRESSINGS) IMPLANT
GLOVE BIO SURGEON STRL SZ 6 (GLOVE) ×4 IMPLANT
GLOVE INDICATOR 6.5 STRL GRN (GLOVE) ×4 IMPLANT
GOWN STRL REUS W/TWL LRG LVL3 (GOWN DISPOSABLE) ×4 IMPLANT
GOWN STRL REUS W/TWL XL LVL3 (GOWN DISPOSABLE) ×4 IMPLANT
GRASPER SUT TROCAR 14GX15 (MISCELLANEOUS) ×2 IMPLANT
KIT TURNOVER KIT A (KITS) ×2 IMPLANT
MAT PREVALON FULL STRYKER (MISCELLANEOUS) ×2 IMPLANT
NEEDLE INSUFFLATION 14GA 120MM (NEEDLE) ×2 IMPLANT
PACK COLON (CUSTOM PROCEDURE TRAY) ×2 IMPLANT
PAD POSITIONING PINK XL (MISCELLANEOUS) IMPLANT
PENCIL SMOKE EVACUATOR (MISCELLANEOUS) IMPLANT
PORT LAP GEL ALEXIS MED 5-9CM (MISCELLANEOUS) IMPLANT
POUCH RETRIEVAL ECOSAC 10 (ENDOMECHANICALS) ×1 IMPLANT
POUCH RETRIEVAL ECOSAC 10MM (ENDOMECHANICALS) ×2
RELOAD STAPLER BLUE 60MM (STAPLE) ×2 IMPLANT
RELOAD STAPLER WHITE 60MM (STAPLE) IMPLANT
RTRCTR WOUND ALEXIS 18CM MED (MISCELLANEOUS)
SCISSORS LAP 5X35 DISP (ENDOMECHANICALS) ×2 IMPLANT
SET IRRIG TUBING LAPAROSCOPIC (IRRIGATION / IRRIGATOR) ×2 IMPLANT
SET TUBE SMOKE EVAC HIGH FLOW (TUBING) ×2 IMPLANT
SHEARS HARMONIC ACE PLUS 36CM (ENDOMECHANICALS) ×2 IMPLANT
SLEEVE XCEL OPT CAN 5 100 (ENDOMECHANICALS) ×8 IMPLANT
SPONGE LAP 18X18 RF (DISPOSABLE) IMPLANT
STAPLE ECHEON FLEX 60 POW ENDO (STAPLE) ×2 IMPLANT
STAPLER ECHELON LONG 60 440 (INSTRUMENTS) IMPLANT
STAPLER RELOAD BLUE 60MM (STAPLE) ×4
STAPLER RELOAD WHITE 60MM (STAPLE)
STAPLER VISISTAT 35W (STAPLE) IMPLANT
SUT MNCRL AB 4-0 PS2 18 (SUTURE) ×2 IMPLANT
SUT PDS AB 0 CT1 36 (SUTURE) IMPLANT
SUT PDS AB 1 CTX 36 (SUTURE) IMPLANT
SUT PROLENE 2 0 KS (SUTURE) IMPLANT
SUT PROLENE 2 0 SH DA (SUTURE) IMPLANT
SUT SILK 2 0 (SUTURE) ×2
SUT SILK 2 0 SH CR/8 (SUTURE) ×2 IMPLANT
SUT SILK 2-0 18XBRD TIE 12 (SUTURE) ×1 IMPLANT
SUT SILK 3 0 (SUTURE) ×2
SUT SILK 3 0 SH CR/8 (SUTURE) ×2 IMPLANT
SUT SILK 3-0 18XBRD TIE 12 (SUTURE) ×1 IMPLANT
SUT VIC AB 2-0 SH 27 (SUTURE)
SUT VIC AB 2-0 SH 27X BRD (SUTURE) IMPLANT
SUT VIC AB 3-0 SH 27 (SUTURE)
SUT VIC AB 3-0 SH 27XBRD (SUTURE) IMPLANT
SUT VICRYL 0 UR6 27IN ABS (SUTURE) ×2 IMPLANT
SYS LAPSCP GELPORT 120MM (MISCELLANEOUS)
SYSTEM LAPSCP GELPORT 120MM (MISCELLANEOUS) IMPLANT
TOWEL OR NON WOVEN STRL DISP B (DISPOSABLE) IMPLANT
TRAY FOLEY MTR SLVR 16FR STAT (SET/KITS/TRAYS/PACK) IMPLANT
TROCAR BLADELESS OPT 5 100 (ENDOMECHANICALS) ×2 IMPLANT
TROCAR XCEL 12X100 BLDLESS (ENDOMECHANICALS) ×2 IMPLANT
TUBING CONNECTING 10 (TUBING) ×4 IMPLANT
TUBING ENDO SMARTCAP (MISCELLANEOUS) ×2 IMPLANT
TUBING ENDOGATOR IRRIG (IRRIGATION / IRRIGATOR) ×2 IMPLANT

## 2020-05-04 NOTE — Op Note (Signed)
Operative Note  [name] [name]  [id number]  [id number]  05/04/2020   Surgeon: Lady Deutscher ConnorMD  Assistant: Ivar Drape MD  Additional intraoperative consultation provided by Dr. 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")  Preop diagnosis: abdominal pain, nausea and emesis Post-op diagnosis/intraop findings: mildly elongated roux limb blind end, no retained food stuff or irritation within; no ulcer or other abnormality on upper endoscopy, no internal hernia or mesenteric defect present  Specimens: small bowl segment Retained items: no EBL: minimal cc Complications: none  Description of procedure: After obtaining informed consent the patient was taken to the operating room and placed supine on operating room table wheregeneral endotracheal anesthesia was initiated, preoperative antibiotics were administered, SCDs applied, and a formal timeout was performed.  Peritoneal access was gained with a optical entry in the left upper quadrant and insufflation to 15 mmHg ensued without incident.  Additional 5 mm trochars were placed under direct visualization.  The patient was placed in reverse Trendelenburg.  The gastrojejunostomy was easily identified and appeared normal with no inflammation present.  The Roux limb was identified and the blind end was noted to be about 8 cm in length with some adhesions tethering it to the Roux limb.  The Roux limb was then followed distally to the jejunojejunostomy which appeared widely patent and normal.  The mesenteric defect was scarred closed and no internal hernia was present at the jejunojejunostomy.  The biliopancreatic limb was also followed proximally and confirmed to be free of any obstruction or abnormality.  The common channel was then traced distally to the ileocecal valve and was confirmed to be of normal orientation with no obstruction or  abnormality present.  There was no intussuscepting segment on examination of the entire small bowel.  The Payson space between the colon and the Roux limb was examined and confirmed to be close to scar tissue and there was no internal hernia present here.  Essentially a completely normal diagnostic laparoscopy.  An upper endoscopy was then performed.  The pouch was examined, there was no ulceration, the anastomosis is widely patent, and the visible components of the Roux limb appear normal on endoscopic views.  Given the elongated candycane in the patient's otherwise unexplainable abdominal pain, we elected to resect this.  This was completed with a blue load linear cutting stapler.  The mesentery was divided with the harmonic scalpel.  Hemostasis was excellent.  Clips were placed on the staple line for additional hemostasis.  The small bowel segment was removed through the right paramedian 12 mm trocar site within an ecosac.  The fascia was closed here with a 0 Vicryl using a laparoscopic suture passer under direct visualization.  The abdomen was then desufflated and all trochars removed.  Local was infiltrated around each of the incisions and the skin was closed with 4-0 subcuticular Monocryl and Dermabond.  The patient was then awakened, extubated and taken to PACU in stable condition.   All counts were correct at the completion of the case.

## 2020-05-04 NOTE — Anesthesia Preprocedure Evaluation (Signed)
Anesthesia Evaluation  Patient identified by MRN, date of birth, ID band Patient awake    Reviewed: NPO status , Patient's Chart, lab work & pertinent test results, reviewed documented beta blocker date and time   History of Anesthesia Complications (+) PONV and history of anesthetic complications  Airway Mallampati: II  TM Distance: >3 FB Neck ROM: Full    Dental  (+) Teeth Intact, Dental Advisory Given   Pulmonary  02/14/2019 SARS coronavirus NEG   breath sounds clear to auscultation       Cardiovascular  Rhythm:Regular Rate:Normal     Neuro/Psych  Headaches, PSYCHIATRIC DISORDERS Anxiety Depression Bipolar Disorder    GI/Hepatic GERD  Medicated,  Endo/Other  Hypothyroidism PCOS  Renal/GU      Musculoskeletal   Abdominal (+) + obese,   Peds  Hematology  (+) anemia ,   Anesthesia Other Findings   Reproductive/Obstetrics                             Anesthesia Physical  Anesthesia Plan  ASA: II  Anesthesia Plan: General   Post-op Pain Management:    Induction: Rapid sequence and Cricoid pressure planned  PONV Risk Score and Plan: 4 or greater and Scopolamine patch - Pre-op, Dexamethasone, Ondansetron and Midazolam  Airway Management Planned: Oral ETT  Additional Equipment: None  Intra-op Plan:   Post-operative Plan: Extubation in OR  Informed Consent: I have reviewed the patients History and Physical, chart, labs and discussed the procedure including the risks, benefits and alternatives for the proposed anesthesia with the patient or authorized representative who has indicated his/her understanding and acceptance.     Dental advisory given  Plan Discussed with: CRNA  Anesthesia Plan Comments:         Anesthesia Quick Evaluation

## 2020-05-04 NOTE — Plan of Care (Signed)
  Problem: Education: Goal: Knowledge of General Education information will improve Description: Including pain rating scale, medication(s)/side effects and non-pharmacologic comfort measures Outcome: Progressing   Problem: Clinical Measurements: Goal: Will remain free from infection Outcome: Progressing   Problem: Clinical Measurements: Goal: Will remain free from infection Outcome: Progressing   Problem: Pain Managment: Goal: General experience of comfort will improve Outcome: Progressing   Problem: Safety: Goal: Ability to remain free from injury will improve Outcome: Progressing

## 2020-05-04 NOTE — Transfer of Care (Signed)
Immediate Anesthesia Transfer of Care Note  Patient: [name] [name]  Procedure(s) Performed: DIAGNOSTIC LAPAROSCOPY, RESECTION OF CANDYCANE, UPPER ENDOSCOPY (N/A Abdomen)  Patient Location: PACU  Anesthesia Type:General  Level of Consciousness: awake and patient cooperative  Airway & Oxygen Therapy: Patient Spontanous Breathing and Patient connected to face mask  Post-op Assessment: Report given to RN and Post -op Vital signs reviewed and stable  Post vital signs: Reviewed and stable  Last Vitals:  Vitals Value Taken Time  BP 130/93 05/04/20 1251  Temp 36.4 C 05/04/20 1251  Pulse 79 05/04/20 1255  Resp 17 05/04/20 1255  SpO2 100 % 05/04/20 1255  Vitals shown include unvalidated device data.  Last Pain:  Vitals:   05/04/20 0942  TempSrc: Oral  PainSc:       Patients Stated Pain Goal: 2 (05/04/20 0447)  Complications: No complications documented.

## 2020-05-04 NOTE — Progress Notes (Signed)
Case reviewed and discussed with Dr. Andrey Campanile. I agree with plans for diagnostic laparoscopy and upper endoscopy.  Patient evaluated and is in agreement with plans for OR.  We will proceed to the OR when the OR is available.

## 2020-05-04 NOTE — Progress Notes (Signed)
Subjective/Chief Complaint: Was able to get some sleep No n/v A little less pain   Objective: Vital signs in last 24 hours: Temp:  [97.7 F (36.5 C)-98.2 F (36.8 C)] 98.1 F (36.7 C) (11/19 0548) Pulse Rate:  [88-89] 89 (11/19 0548) Resp:  [16] 16 (11/19 0548) BP: (114-149)/(75-101) 114/75 (11/19 0548) SpO2:  [94 %-98 %] 94 % (11/19 0548) Last BM Date: 05/03/20  Intake/Output from previous day: 11/18 0701 - 11/19 0700 In: 2899.8 [P.O.:60; I.V.:1640; IV Piggyback:1199.8] Out: 1850 [Urine:1850] Intake/Output this shift: No intake/output data recorded.  Asleep, husband at [facility] Nontoxic symm chest rise Obese, old trocar sites, less tender in upper abd   Lab Results:  Recent Labs    05/03/20 1747 05/04/20 0602  WBC 8.6 5.9  HGB 11.6* 10.2*  HCT 36.7 32.5*  PLT 339 258   BMET Recent Labs    05/03/20 1747 05/04/20 0602  NA 138 137  K 4.0 3.5  CL 104 109  CO2 23 24  GLUCOSE 85 82  BUN 12 7  CREATININE 0.62 0.65  CALCIUM 8.7* 7.8*   PT/INR No results for input(s): LABPROT, INR in the last 72 hours. ABG No results for input(s): PHART, HCO3 in the last 72 hours.  Invalid input(s): PCO2, PO2  Studies/Results: CT ABDOMEN PELVIS W CONTRAST  Result Date: 05/03/2020 CLINICAL DATA:  Right upper quadrant pain with nausea and vomiting. Cholecystectomy. Roux-en-Y gastric bypass 7 years ago. EXAM: CT ABDOMEN AND PELVIS WITH CONTRAST TECHNIQUE: Multidetector CT imaging of the abdomen and pelvis was performed using the standard protocol following bolus administration of intravenous contrast. CONTRAST:  OMNIPAQUE IOHEXOL 300 MG/ML  SOLN COMPARISON:  10/13/2019 stone study FINDINGS: Lower chest: Mild left base scarring. Normal heart size without pericardial or pleural effusion. Similar tiny hiatal hernia. Hepatobiliary: Normal liver. Cholecystectomy, without biliary ductal dilatation. Pancreas: Normal, without mass or ductal dilatation. Spleen: Normal in size,  without focal abnormality. Adrenals/Urinary Tract: Normal adrenal glands. Normal kidneys, without hydronephrosis. Normal urinary bladder. Stomach/Bowel: Status post antecolic Roux-en-Y gastric bypass. Normal caliber of the bypassed stomach, without contrast within to suggest gastric/gastric fistula. The antecolic Roux loop is normal in caliber, including on 19/2. A separate loop of small bowel is positioned anterior to the colon, to the right of the Roux loop including on 19/2. There is a approximately 4.5 cm intussusception within this loop of small bowel. Example 19/2 and coronal image 29/5. No surrounding edema or bowel wall thickening to confirm acute clinical significance. Otherwise normal small bowel. Large distal stool volume, suggesting constipation Normal terminal ileum and appendix. Vascular/Lymphatic: Normal caliber of the aorta and branch vessels. No abdominopelvic adenopathy. Reproductive: Normal uterus and adnexa. Other: No significant free fluid.  No free intraperitoneal air. Musculoskeletal: No acute osseous abnormality. IMPRESSION: 1. Status post antecolic Roux-en-Y gastric bypass. 2. New small bowel loop positioned anterior to the transverse colon, right of the normal appearing Roux loop. Small bowel/small bowel intussusception within this small bowel loop. Findings could be incidental and transient, given lack of surrounding edema or proximal obstruction. Internal hernia and intussusception secondary to lead point cannot be excluded. If patient's symptoms persist or warrant, consider repeat with either CT enterography or abdominal CT at 2-3 days. 3. Distal stool burden suggests constipation, which also may be the explanation for right upper quadrant pain. 4.  Tiny hiatal hernia. Electronically Signed   By: Jeronimo Greaves M.D.   On: 05/03/2020 15:31    Anti-infectives: Anti-infectives (From admission, onward)   None  Assessment/Plan: 3-week history of intermittent right upper quadrant  pain with nausea and vomiting History of laparoscopic Roux-en-Y gastric bypass   I have reviewed her imaging with several of my bariatric partners including Dr. Fredricka Bonine and Dr. Daphine Deutscher.  Also reviewed it with an additional radiologist.  Given the chronicity of her symptoms and the lack of improvement I believe she needs a diagnostic laparoscopy possible exploratory laparotomy possible bowel resection and upper endoscopy.  Unclear as to the etiology of the radiological abnormality.  Not sure if she truly has an intussusception versus adhesive band tethering to loops versus Roux limb tethered to the falciform ligament versus marginal ulcer  Discussed risk and benefits of the procedure including but not limited to bleeding, infection, injury to surrounding structures, possible need for bowel resection with associated risk such as stricture and leak, perioperative cardiac and pulmonary events, wound infection, hernia formation, need for additional procedures.  Discussed that the aftercare truly depends on what we find intraoperatively.  Discussed that Dr. Fredricka Bonine and I will perform the procedure  IV antibiotic on-call IVF  [name] Andrey Campanile, MD, FACS General, Bariatric, & Minimally Invasive Surgery [facility], [place]   LOS: 0 days    Gaynelle Adu 05/04/2020

## 2020-05-04 NOTE — Anesthesia Procedure Notes (Signed)
Procedure Name: Intubation Date/Time: 05/04/2020 11:26 AM Performed by: Vanessa Champ[place], CRNA Pre-anesthesia Checklist: Patient identified, Emergency Drugs available, Suction available and Patient being monitored Patient Re-evaluated:Patient Re-evaluated prior to induction Oxygen Delivery Method: Circle system utilized Preoxygenation: Pre-oxygenation with 100% oxygen Induction Type: IV induction Ventilation: Mask ventilation without difficulty Laryngoscope Size: 2 and [name] Grade View: Grade I Tube type: Oral Tube size: 7.0 mm Number of attempts: 1 Airway Equipment and Method: Stylet Placement Confirmation: ETT inserted through vocal cords under direct vision,  positive ETCO2 and breath sounds checked- equal and bilateral Secured at: 21 cm Tube secured with: Tape Dental Injury: Teeth and Oropharynx as per pre-operative assessment

## 2020-05-05 MED ORDER — DOCUSATE SODIUM 100 MG PO CAPS
100.0000 mg | ORAL_CAPSULE | Freq: Two times a day (BID) | ORAL | Status: DC
  Administered 2020-05-05 – 2020-05-06 (×3): 100 mg via ORAL
  Filled 2020-05-05 (×3): qty 1

## 2020-05-05 NOTE — Progress Notes (Signed)
1 Day Post-Op   Subjective/Chief Complaint: Some nausea, just started liquids, ambulating, pain controlled   Objective: Vital signs in last 24 hours: Temp:  [97.5 F (36.4 C)-98.5 F (36.9 C)] 97.9 F (36.6 C) (11/20 0538) Pulse Rate:  [67-94] 75 (11/20 0538) Resp:  [12-19] 19 (11/20 0538) BP: (123-142)/(52-99) 142/91 (11/20 0538) SpO2:  [92 %-100 %] 99 % (11/20 0538) Last BM Date: 05/03/20  Intake/Output from previous day: 11/19 0701 - 11/20 0700 In: 3114.4 [P.O.:360; I.V.:2654.4; IV Piggyback:100] Out: 2620 [Urine:2600; Blood:20] Intake/Output this shift: No intake/output data recorded.  GI: soft approp tender incisions clean   Lab Results:  Recent Labs    05/03/20 1747 05/04/20 0602  WBC 8.6 5.9  HGB 11.6* 10.2*  HCT 36.7 32.5*  PLT 339 258   BMET Recent Labs    05/03/20 1747 05/04/20 0602  NA 138 137  K 4.0 3.5  CL 104 109  CO2 23 24  GLUCOSE 85 82  BUN 12 7  CREATININE 0.62 0.65  CALCIUM 8.7* 7.8*   PT/INR No results for input(s): LABPROT, INR in the last 72 hours. ABG No results for input(s): PHART, HCO3 in the last 72 hours.  Invalid input(s): PCO2, PO2  Studies/Results: CT ABDOMEN PELVIS W CONTRAST  Result Date: 05/03/2020 CLINICAL DATA:  Right upper quadrant pain with nausea and vomiting. Cholecystectomy. Roux-en-Y gastric bypass 7 years ago. EXAM: CT ABDOMEN AND PELVIS WITH CONTRAST TECHNIQUE: Multidetector CT imaging of the abdomen and pelvis was performed using the standard protocol following bolus administration of intravenous contrast. CONTRAST:  OMNIPAQUE IOHEXOL 300 MG/ML  SOLN COMPARISON:  10/13/2019 stone study FINDINGS: Lower chest: Mild left base scarring. Normal heart size without pericardial or pleural effusion. Similar tiny hiatal hernia. Hepatobiliary: Normal liver. Cholecystectomy, without biliary ductal dilatation. Pancreas: Normal, without mass or ductal dilatation. Spleen: Normal in size, without focal abnormality.  Adrenals/Urinary Tract: Normal adrenal glands. Normal kidneys, without hydronephrosis. Normal urinary bladder. Stomach/Bowel: Status post antecolic Roux-en-Y gastric bypass. Normal caliber of the bypassed stomach, without contrast within to suggest gastric/gastric fistula. The antecolic Roux loop is normal in caliber, including on 19/2. A separate loop of small bowel is positioned anterior to the colon, to the right of the Roux loop including on 19/2. There is a approximately 4.5 cm intussusception within this loop of small bowel. Example 19/2 and coronal image 29/5. No surrounding edema or bowel wall thickening to confirm acute clinical significance. Otherwise normal small bowel. Large distal stool volume, suggesting constipation Normal terminal ileum and appendix. Vascular/Lymphatic: Normal caliber of the aorta and branch vessels. No abdominopelvic adenopathy. Reproductive: Normal uterus and adnexa. Other: No significant free fluid.  No free intraperitoneal air. Musculoskeletal: No acute osseous abnormality. IMPRESSION: 1. Status post antecolic Roux-en-Y gastric bypass. 2. New small bowel loop positioned anterior to the transverse colon, right of the normal appearing Roux loop. Small bowel/small bowel intussusception within this small bowel loop. Findings could be incidental and transient, given lack of surrounding edema or proximal obstruction. Internal hernia and intussusception secondary to lead point cannot be excluded. If patient's symptoms persist or warrant, consider repeat with either CT enterography or abdominal CT at 2-3 days. 3. Distal stool burden suggests constipation, which also may be the explanation for right upper quadrant pain. 4.  Tiny hiatal hernia. Electronically Signed   By: Jeronimo Greaves M.D.   On: 05/03/2020 15:31    Anti-infectives: Anti-infectives (From admission, onward)   Start     Dose/Rate Route Frequency Ordered Stop  05/04/20 1115  ceFAZolin (ANCEF) IVPB 2g/100 mL premix         2 g 200 mL/hr over 30 Minutes Intravenous On call to O.R. 05/04/20 0801 05/04/20 1137      Assessment/Plan: POD 1 resection blind limb -full liquids for 2 weeks -will keep until tomorrow due to nausea and pain -start colace per request -lovenox scdxs  Emelia Loron 05/05/2020

## 2020-05-06 MED ORDER — OXYCODONE HCL 5 MG PO TABS: 5.0000 mg | ORAL_TABLET | ORAL | 0 refills | Status: DC | PRN

## 2020-05-06 MED ORDER — DOCUSATE SODIUM 100 MG PO CAPS
100.0000 mg | ORAL_CAPSULE | Freq: Two times a day (BID) | ORAL | 2 refills | Status: DC
Start: 2020-05-06 — End: 2021-07-11

## 2020-05-06 MED ORDER — ONDANSETRON HCL 4 MG PO TABS: 4.0000 mg | ORAL_TABLET | Freq: Three times a day (TID) | ORAL | 5 refills | Status: DC | PRN

## 2020-05-06 NOTE — Progress Notes (Signed)
2 Days Post-Op   Subjective/Chief Complaint: Passing flatus, nausea now better, pain controlled, ambulating, tol fulls   Objective: Vital signs in last 24 hours: Temp:  [97.7 F (36.5 C)-97.9 F (36.6 C)] 97.9 F (36.6 C) (11/21 0519) Pulse Rate:  [75-84] 84 (11/21 0519) Resp:  [14-16] 14 (11/21 0519) BP: (110-124)/(66-78) 110/66 (11/21 0519) SpO2:  [97 %-100 %] 97 % (11/21 0519) Last BM Date: 05/03/20  Intake/Output from previous day: 11/20 0701 - 11/21 0700 In: 905 [P.O.:600; I.V.:305] Out: 2800 [Urine:2800] Intake/Output this shift: No intake/output data recorded.  GI: soft nt/nd incisions clean  Lab Results:  Recent Labs    05/03/20 1747 05/04/20 0602  WBC 8.6 5.9  HGB 11.6* 10.2*  HCT 36.7 32.5*  PLT 339 258   BMET Recent Labs    05/03/20 1747 05/04/20 0602  NA 138 137  K 4.0 3.5  CL 104 109  CO2 23 24  GLUCOSE 85 82  BUN 12 7  CREATININE 0.62 0.65  CALCIUM 8.7* 7.8*   PT/INR No results for input(s): LABPROT, INR in the last 72 hours. ABG No results for input(s): PHART, HCO3 in the last 72 hours.  Invalid input(s): PCO2, PO2  Studies/Results: No results found.  Anti-infectives: Anti-infectives (From admission, onward)   Start     Dose/Rate Route Frequency Ordered Stop   05/04/20 1115  ceFAZolin (ANCEF) IVPB 2g/100 mL premix        2 g 200 mL/hr over 30 Minutes Intravenous On call to O.R. 05/04/20 0801 05/04/20 1137      Assessment/Plan: POD 2 resection blind limb -full liquids -dc home today  Emelia Loron 05/06/2020

## 2020-05-06 NOTE — Progress Notes (Signed)
D/C instructions given patient. Patient had no questions. NT or writer will wheel patient once she is ready.

## 2020-05-06 NOTE — Discharge Instructions (Signed)
Stay on full liquids for one week then advance diet as tolerated  LAPAROSCOPIC SURGERY: POST OP INSTRUCTIONS  Always review your discharge instruction sheet given to you by the facility where your surgery was performed. IF YOU HAVE DISABILITY OR FAMILY LEAVE FORMS, YOU MUST BRING THEM TO THE OFFICE FOR PROCESSING.   DO NOT GIVE THEM TO YOUR DOCTOR.  1. A prescription for pain medication may be given to you upon discharge.  Take your pain medication as prescribed, if needed.  If narcotic pain medicine is not needed, then you may take acetaminophen (Tylenol), naprosyn (Alleve), or ibuprofen (Advil) as needed. 2. Take your usually prescribed medications unless otherwise directed. 3. If you need a refill on your pain medication, please contact your pharmacy.  They will contact our office to request authorization. Prescriptions will not be filled after 5pm or on week-ends. 4. You should follow a light diet the first few days after arrival home, such as soup and crackers, etc.  Be sure to include lots of fluids daily. 5. Most patients will experience some swelling and bruising in the area of the incisions.  Ice packs will help.  Swelling and bruising can take several days to resolve.  6. It is common to experience some constipation if taking pain medication after surgery.  Increasing fluid intake and taking a stool softener (such as Colace) will usually help or prevent this problem from occurring.  A mild laxative (Milk of Magnesia or Miralax) should be taken according to package instructions if there are no bowel movements after 48 hours. 7. Unless discharge instructions indicate otherwise, you may remove your bandages 48 hours after surgery, and you may shower at that time.  You may have steri-strips (small skin tapes) in place directly over the incision.  These strips should be left on the skin for 7-10 days.  If your surgeon used skin glue on the incision, you may shower in  24 hours.  The glue will flake off over the next 2-3 weeks.  Any sutures or staples will be removed at the office during your follow-up visit. 8. ACTIVITIES:  You may resume regular (light) daily activities beginning the next day--such as daily self-care, walking, climbing stairs--gradually increasing activities as tolerated.  You may have sexual intercourse when it is comfortable.  Refrain from any heavy lifting or straining until approved by your doctor. a. You may drive when you are no longer taking prescription pain medication, you can comfortably wear a seatbelt, and you can safely maneuver your car and apply brakes. b. RETURN TO WORK:  __________________________________________________________ 9. You should see your doctor in the office for a follow-up appointment approximately 2-3 weeks after your surgery.  Make sure that you call for this appointment within a day or two after you arrive home to insure a convenient appointment time. 10. OTHER INSTRUCTIONS: __________________________________________________________________________________________________________________________ __________________________________________________________________________________________________________________________ WHEN TO CALL YOUR DOCTOR: 1. Fever over 101.0 2. Inability to urinate 3. Continued bleeding from incision. 4. Increased pain, redness, or drainage from the incision. 5. Increasing abdominal pain  The clinic staff is available to answer your questions during regular business hours.  Please don't hesitate to call and ask to speak to one of the nurses for clinical concerns.  If you have a medical emergency, go to the nearest emergency room or call 911.  A surgeon from [facility] is always on call at the hospital. [address], [place], [place]  [address] ? [address] [address], [place], [place]  [address] [phone] ? [phone] ? FAX [phone] Web site:  [email or web address]

## 2020-05-07 ENCOUNTER — Encounter (HOSPITAL_COMMUNITY): Payer: Self-pay | Admitting: Surgery

## 2020-05-07 DIAGNOSIS — F3181 Bipolar II disorder: Secondary | ICD-10-CM | POA: Diagnosis not present

## 2020-05-07 DIAGNOSIS — F1910 Other psychoactive substance abuse, uncomplicated: Secondary | ICD-10-CM | POA: Diagnosis not present

## 2020-05-07 LAB — SURGICAL PATHOLOGY

## 2020-05-07 NOTE — Discharge Summary (Signed)
Patient admitted for diagnostic laparoscopy for abdominal pain. See op note. Recovered uneventfully.

## 2020-05-09 DIAGNOSIS — E039 Hypothyroidism, unspecified: Secondary | ICD-10-CM | POA: Diagnosis not present

## 2020-05-09 DIAGNOSIS — E785 Hyperlipidemia, unspecified: Secondary | ICD-10-CM | POA: Diagnosis not present

## 2020-05-09 DIAGNOSIS — F319 Bipolar disorder, unspecified: Secondary | ICD-10-CM | POA: Diagnosis not present

## 2020-05-09 DIAGNOSIS — M797 Fibromyalgia: Secondary | ICD-10-CM | POA: Diagnosis not present

## 2020-05-09 DIAGNOSIS — I10 Essential (primary) hypertension: Secondary | ICD-10-CM | POA: Diagnosis not present

## 2020-05-09 DIAGNOSIS — E282 Polycystic ovarian syndrome: Secondary | ICD-10-CM | POA: Diagnosis not present

## 2020-05-09 DIAGNOSIS — D509 Iron deficiency anemia, unspecified: Secondary | ICD-10-CM | POA: Diagnosis not present

## 2020-05-09 DIAGNOSIS — Z9049 Acquired absence of other specified parts of digestive tract: Secondary | ICD-10-CM | POA: Diagnosis not present

## 2020-05-09 DIAGNOSIS — F419 Anxiety disorder, unspecified: Secondary | ICD-10-CM | POA: Diagnosis not present

## 2020-05-09 DIAGNOSIS — R1032 Left lower quadrant pain: Secondary | ICD-10-CM | POA: Diagnosis not present

## 2020-05-09 DIAGNOSIS — K449 Diaphragmatic hernia without obstruction or gangrene: Secondary | ICD-10-CM | POA: Diagnosis not present

## 2020-05-09 DIAGNOSIS — R1033 Periumbilical pain: Secondary | ICD-10-CM | POA: Diagnosis not present

## 2020-05-09 DIAGNOSIS — Z9884 Bariatric surgery status: Secondary | ICD-10-CM | POA: Diagnosis not present

## 2020-05-09 DIAGNOSIS — R1013 Epigastric pain: Secondary | ICD-10-CM | POA: Diagnosis not present

## 2020-05-09 DIAGNOSIS — Z79899 Other long term (current) drug therapy: Secondary | ICD-10-CM | POA: Diagnosis not present

## 2020-05-09 DIAGNOSIS — R110 Nausea: Secondary | ICD-10-CM | POA: Diagnosis not present

## 2020-05-09 NOTE — Anesthesia Postprocedure Evaluation (Signed)
Anesthesia Post Note  Patient: [name] [name]  Procedure(s) Performed: DIAGNOSTIC LAPAROSCOPY, RESECTION OF CANDYCANE, UPPER ENDOSCOPY (N/A Abdomen)     Patient location during evaluation: PACU Anesthesia Type: General Level of consciousness: sedated Pain management: pain level controlled Vital Signs Assessment: post-procedure vital signs reviewed and stable Respiratory status: spontaneous breathing Cardiovascular status: stable Postop Assessment: no headache Anesthetic complications: yes (PONV)   No complications documented.  Last Vitals:  Vitals:   05/05/20 2140 05/06/20 0519  BP: 120/78 110/66  Pulse: 75 84  Resp: 16 14  Temp: 36.6 C 36.6 C  SpO2: 100% 97%    Last Pain:  Vitals:   05/06/20 0932  TempSrc:   PainSc: 8                  John F Scharlene Corn

## 2020-05-10 DIAGNOSIS — R1013 Epigastric pain: Secondary | ICD-10-CM | POA: Diagnosis not present

## 2020-05-17 ENCOUNTER — Ambulatory Visit (INDEPENDENT_AMBULATORY_CARE_PROVIDER_SITE_OTHER): Payer: BC Managed Care – PPO | Admitting: Psychology

## 2020-05-17 DIAGNOSIS — F4322 Adjustment disorder with anxiety: Secondary | ICD-10-CM | POA: Diagnosis not present

## 2020-05-17 DIAGNOSIS — F5101 Primary insomnia: Secondary | ICD-10-CM

## 2020-05-18 ENCOUNTER — Emergency Department (HOSPITAL_BASED_OUTPATIENT_CLINIC_OR_DEPARTMENT_OTHER)
Admission: EM | Admit: 2020-05-18 | Discharge: 2020-05-19 | Disposition: A | Discharge status: Home / Self Care | Payer: BC Managed Care – PPO | Attending: Emergency Medicine | Admitting: Emergency Medicine

## 2020-05-18 ENCOUNTER — Encounter (HOSPITAL_BASED_OUTPATIENT_CLINIC_OR_DEPARTMENT_OTHER): Payer: Self-pay | Admitting: *Deleted

## 2020-05-18 ENCOUNTER — Other Ambulatory Visit: Payer: Self-pay

## 2020-05-18 DIAGNOSIS — R112 Nausea with vomiting, unspecified: Secondary | ICD-10-CM | POA: Diagnosis not present

## 2020-05-18 DIAGNOSIS — Z9049 Acquired absence of other specified parts of digestive tract: Secondary | ICD-10-CM | POA: Insufficient documentation

## 2020-05-18 DIAGNOSIS — E039 Hypothyroidism, unspecified: Secondary | ICD-10-CM | POA: Diagnosis not present

## 2020-05-18 DIAGNOSIS — R109 Unspecified abdominal pain: Secondary | ICD-10-CM

## 2020-05-18 DIAGNOSIS — I10 Essential (primary) hypertension: Secondary | ICD-10-CM | POA: Insufficient documentation

## 2020-05-18 MED ORDER — HYDROMORPHONE HCL 1 MG/ML IJ SOLN
1.0000 mg | Freq: Once | INTRAMUSCULAR | Status: AC
  Administered 2020-05-19: 1 mg via INTRAVENOUS
  Filled 2020-05-18: qty 1

## 2020-05-18 MED ORDER — LACTATED RINGERS IV BOLUS
1000.0000 mL | Freq: Once | INTRAVENOUS | Status: AC
  Administered 2020-05-19: 1000 mL via INTRAVENOUS

## 2020-05-18 MED ORDER — PROCHLORPERAZINE EDISYLATE 10 MG/2ML IJ SOLN
10.0000 mg | Freq: Once | INTRAMUSCULAR | Status: AC
  Administered 2020-05-19: 10 mg via INTRAVENOUS
  Filled 2020-05-18: qty 2

## 2020-05-18 NOTE — ED Triage Notes (Signed)
C/o abd pain n/v , reports abd surgery 11/19

## 2020-05-19 LAB — CBC WITH DIFFERENTIAL/PLATELET
Abs Immature Granulocytes: 0.04 10*3/uL (ref 0.00–0.07)
Basophils Absolute: 0.1 10*3/uL (ref 0.0–0.1)
Basophils Relative: 1 %
Eosinophils Absolute: 0.1 10*3/uL (ref 0.0–0.5)
Eosinophils Relative: 1 %
HCT: 40.1 % (ref 36.0–46.0)
Hemoglobin: 12.8 g/dL (ref 12.0–15.0)
Immature Granulocytes: 0 %
Lymphocytes Relative: 29 %
Lymphs Abs: 2.7 10*3/uL (ref 0.7–4.0)
MCH: 26.1 pg (ref 26.0–34.0)
MCHC: 31.9 g/dL (ref 30.0–36.0)
MCV: 81.7 fL (ref 80.0–100.0)
Monocytes Absolute: 0.7 10*3/uL (ref 0.1–1.0)
Monocytes Relative: 8 %
Neutro Abs: 5.7 10*3/uL (ref 1.7–7.7)
Neutrophils Relative %: 61 %
Platelets: 390 10*3/uL (ref 150–400)
RBC: 4.91 MIL/uL (ref 3.87–5.11)
RDW: 15.3 % (ref 11.5–15.5)
WBC: 9.3 10*3/uL (ref 4.0–10.5)
nRBC: 0 % (ref 0.0–0.2)

## 2020-05-19 LAB — URINALYSIS, ROUTINE W REFLEX MICROSCOPIC
Bilirubin Urine: NEGATIVE
Glucose, UA: NEGATIVE mg/dL
Ketones, ur: NEGATIVE mg/dL
Leukocytes,Ua: NEGATIVE
Nitrite: NEGATIVE
Protein, ur: NEGATIVE mg/dL
Specific Gravity, Urine: >1.03 — ABNORMAL HIGH (ref 1.005–1.030)
pH: 5.5 (ref 5.0–8.0)

## 2020-05-19 LAB — COMPREHENSIVE METABOLIC PANEL
ALT: 13 U/L (ref 0–44)
AST: 17 U/L (ref 15–41)
Albumin: 4.1 g/dL (ref 3.5–5.0)
Alkaline Phosphatase: 148 U/L — ABNORMAL HIGH (ref 38–126)
Anion gap: 13 (ref 5–15)
BUN: 10 mg/dL (ref 6–20)
CO2: 23 mmol/L (ref 22–32)
Calcium: 8.9 mg/dL (ref 8.9–10.3)
Chloride: 106 mmol/L (ref 98–111)
Creatinine, Ser: 0.65 mg/dL (ref 0.44–1.00)
GFR, Estimated: >60 mL/min (ref >60)
Glucose, Bld: 78 mg/dL (ref 70–99)
Potassium: 3.3 mmol/L — ABNORMAL LOW (ref 3.5–5.1)
Sodium: 142 mmol/L (ref 135–145)
Total Bilirubin: <0.1 mg/dL — ABNORMAL LOW (ref 0.3–1.2)
Total Protein: 7.1 g/dL (ref 6.5–8.1)

## 2020-05-19 LAB — URINALYSIS, MICROSCOPIC (REFLEX)
RBC / HPF: NONE SEEN RBC/hpf (ref 0–5)
WBC, UA: NONE SEEN WBC/hpf (ref 0–5)

## 2020-05-19 LAB — LIPASE, BLOOD: Lipase: 33 U/L (ref 11–51)

## 2020-05-19 MED ORDER — OXYCODONE HCL 5 MG PO TABS
5.0000 mg | ORAL_TABLET | Freq: Four times a day (QID) | ORAL | 0 refills | Status: DC | PRN
Start: 2020-05-19 — End: 2020-05-19

## 2020-05-19 MED ORDER — PROMETHAZINE HCL 25 MG PO TABS: 25.0000 mg | ORAL_TABLET | Freq: Four times a day (QID) | ORAL | 0 refills | Status: DC | PRN

## 2020-05-19 MED ORDER — OXYCODONE HCL 5 MG PO TABS
5.0000 mg | ORAL_TABLET | Freq: Four times a day (QID) | ORAL | 0 refills | Status: DC | PRN
Start: 2020-05-19 — End: 2020-05-28

## 2020-05-19 MED ORDER — PROMETHAZINE HCL 25 MG RE SUPP: 25.0000 mg | Freq: Four times a day (QID) | RECTAL | 0 refills | Status: DC | PRN

## 2020-05-19 MED ORDER — ACETAMINOPHEN 500 MG PO TABS
1000.0000 mg | ORAL_TABLET | Freq: Once | ORAL | Status: AC
  Administered 2020-05-19: 1000 mg via ORAL
  Filled 2020-05-19: qty 2

## 2020-05-19 MED ORDER — OXYCODONE HCL 5 MG PO TABS
10.0000 mg | ORAL_TABLET | Freq: Once | ORAL | Status: AC
  Administered 2020-05-19: 10 mg via ORAL
  Filled 2020-05-19: qty 2

## 2020-05-19 NOTE — ED Provider Notes (Signed)
[facility] EMERGENCY DEPARTMENT Provider Note   CSN: [id number] Arrival date & time: 05/18/20  2214     History Chief Complaint  Patient presents with  . Post-op Problem    [name] [name] is a 34 y.o. female.  34 year old female with medical problems documented below who presents to the emergency department today with abdominal pain, nausea and vomiting.  Apparently patient had a diagnostic laparoscopy approximately 2 weeks ago with Drs. Andrey Campanile and [employer].  She had intussusception at that time.  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  She states that she had incisional pain since that time but not able to get into the office.  She had nausea since that time as well.  Today she had 3-4 episodes of vomiting which does not seem to be getting better.  She tried Zofran multiple times without help.  Normal urination.  No fevers.  She has loose stools because she still on stool softeners per surgery's recommendations.        Past Medical History:  Diagnosis Date  . ADD (attention deficit disorder)   . Anemia   . Anxiety   . B12 deficiency   . Back pain   . Bilateral swelling of feet   . Bipolar 2 disorder (HCC) 09/19/2019  . Constipation   . DEPRESSION   . Elevated cholesterol   . Family history of colon cancer 06-14-2019   Father, 28s; deceased.   . Fatty liver   . Fibromyalgia   . Gallbladder problem   . GERD (gastroesophageal reflux disease)   . Heartburn   . History of stomach ulcers   . Hypertension   . Hypothyroidism    Hx of, normalized TSH during pregnancy 2011  . Infertility, female   . Lactose intolerance   . Migraines   . Morbid obesity (HCC)    s/p RY 08/2012 - start weight 290 pounds  . Nephrolithiasis   . Palpitations   . Panic disorder   . PCOS (polycystic ovarian syndrome)   . PONV (postoperative nausea and vomiting)   . Pregnancy induced hypertension   . Shortness of breath   . Vitamin  D deficiency     Patient Active Problem List   Diagnosis Date Noted  . Abdominal pain 05/03/2020  . Dehydration 05/03/2020  . Nausea & vomiting 05/03/2020  . 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  . Plantar fasciitis 10/31/2019  . Bipolar 2 disorder (HCC) 09/19/2019  . Fibromyalgia 09/09/2019  . Ischial bursitis of left side 07/19/2019  . Vitamin B12 deficiency 06/01/2019  . Vitamin D deficiency 06/01/2019  . Panic disorder 06/01/2019  . Family history of colon cancer 2019/06/14  . Chronic constipation 06/14/2019  . Major depression, recurrent, chronic (HCC) 06/14/2019  . Insomnia due to other mental disorder 03/13/2019  . Pain in both lower extremities 11/21/2018  . Nocturnal leg cramps 10/26/2018  . Nephrolithiasis   . Reduced libido, due to Lexapro 07/09/2017  . Binge eating disorder 04/23/2017  . Morbid obesity (HCC) 04/23/2017  . Acquired iron deficiency anemia due to decreased absorption 02/22/2015  . 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 Quintella Reichert OSTEOTOMY Left 06/24/2017   Procedure: Ralene Bathe;  Surgeon: [name], DPM;  Location: [facility];  Service: Podiatry;  Laterality: Left;  . BUNIONECTOMY Left 06/24/2017   Procedure: Anthoney Harada;  Surgeon: Ardelle Anton,  Lesia Sago, DPM;  Location: [facility];  Service: Podiatry;  Laterality: Left;  . CESAREAN SECTION     x 2  . CHOLECYSTECTOMY N/A 02/15/2019   Procedure: LAPAROSCOPIC CHOLECYSTECTOMY WITH INTRAOPERATIVE CHOLANGIOGRAM;  Surgeon: Andria Meuse, MD;  Location: [facility];  Service: General;  Laterality: N/A;  . COLON RESECTION N/A 05/04/2020   Procedure: DIAGNOSTIC LAPAROSCOPY, RESECTION OF CANDYCANE, UPPER ENDOSCOPY;  Surgeon: Berna Bue, MD;  Location: [facility];  Service: General;  Laterality: N/A;  . FOOT SURGERY    . 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   Procedure: LAPAROSCOPIC  ROUX-EN-Y GASTRIC;  Surgeon: Atilano Ina, MD;  Location: [facility];  Service: General;  Laterality: N/A;  laparoscopic roux-en-y gastric bypass  . LITHOTRIPSY Left   . TONSILLECTOMY    . TUBAL LIGATION    . UPPER GI ENDOSCOPY  08/24/2012   Procedure: UPPER GI ENDOSCOPY;  Surgeon: Atilano Ina, MD;  Location: [facility];  Service: General;;  . WISDOM TOOTH EXTRACTION       OB History    Gravida  2   Para  2   Term  2   Preterm  0   AB  0   Living  2     SAB  0   TAB  0   Ectopic  0   Multiple  0   Live Births  2           Family History  Problem Relation Age of Onset  . Hyperlipidemia Father   . Hypertension Father   . Kidney disease Father   . Colon cancer Father 70  . Cancer Father   . Depression Father   . Anxiety disorder Father   . Bipolar disorder Father   . Sleep apnea Father   . Obesity Father   . Hypertension Mother   . Depression Mother   . Anxiety disorder Mother   . Obesity Mother   . Hypertension Maternal Grandmother   . Uterine cancer Maternal Grandmother 45  . Diabetes Maternal Grandfather     Social History   Tobacco Use  . Smoking status: Never Smoker  . Smokeless tobacco: Never Used  Vaping Use  . Vaping Use: Never used  Substance Use Topics  . Alcohol use: Yes    Comment: rare/socially  . Drug use: No    Home Medications Prior to Admission medications   Medication Sig Start Date End Date Taking? Authorizing Provider  acetaminophen (TYLENOL) 500 MG tablet Take 2 tablets (1,000 mg total) by mouth every 6 (six) hours as needed for moderate pain. 02/13/19   Fayrene Helper, PA-C  albuterol (VENTOLIN HFA) 108 (90 Base) MCG/ACT inhaler Inhale 2 puffs into the lungs every 4 (four) hours as needed for wheezing or shortness of breath.  02/21/20   [provider]  busPIRone (BUSPAR) 15 MG tablet Take 15 mg by mouth 3 (three) times daily. 11/01/19   [provider]  clotrimazole (MYCELEX) 10 MG troche Take 1 tablet (10 mg  total) by mouth 5 (five) times daily. Patient taking differently: Take 10 mg by mouth daily as needed (trush).  04/10/20   Terressa Koyanagi, DO  cyanocobalamin (,VITAMIN B-12,) 1000 MCG/ML injection Inject 1 vial per week for 3 weeks, then 1 vial per month thereafter Patient taking differently: Inject 100 mcg into the skin once a week. Tuesdays 05/30/19   Willow Ora, MD  diclofenac Sodium (VOLTAREN) 1 % GEL Apply 4 g topically 4 (four) times  daily. Patient taking differently: Apply 4 g topically 4 (four) times daily as needed (pain).  05/13/19   Palumbo, April, MD  docusate sodium (COLACE) 100 MG capsule Take 1 capsule (100 mg total) by mouth 2 (two) times daily. 05/06/20   Emelia Loron, MD  FIBER ADULT GUMMIES PO Take 4 tablets by mouth daily.     [provider]  hydrOXYzine (VISTARIL) 50 MG capsule Take 50 mg by mouth in the morning, at noon, and at bedtime.  08/24/19   [provider]  lamoTRIgine (LAMICTAL) 150 MG tablet Take 300 mg by mouth daily.  04/10/20   [provider]  lithium carbonate (LITHOBID) 300 MG CR tablet Take 600 mg by mouth at bedtime.    [provider]  Magnesium 250 MG TABS Take 500 mg by mouth at bedtime.     [provider]  metFORMIN (GLUCOPHAGE) 500 MG tablet Take 500 mg by mouth 2 (two) times daily. 11/01/19   [provider]  Multiple Vitamins-Minerals (MULTIVITAMIN ADULT) CHEW Chew 2 tablets by mouth daily.    [provider]  ondansetron (ZOFRAN ODT) 4 MG disintegrating tablet 4mg  ODT q4 hours prn nausea/vomit Patient taking differently: Take 4 mg by mouth every hour as needed for nausea or vomiting.  10/13/19   10/15/19, PA-C  ondansetron (ZOFRAN) 4 MG tablet Take 1 tablet (4 mg total) by mouth every 8 (eight) hours as needed for nausea. 05/06/20   05/08/20, MD  oxyCODONE (ROXICODONE) 5 MG immediate release tablet Take 1 tablet (5 mg total) by mouth every 6 (six) hours as needed  for severe pain. 05/19/20   Nikolette Reindl, 14/4/21, MD  pregabalin (LYRICA) 75 MG capsule Take 1 capsule (75 mg total) by mouth 2 (two) times daily. 01/18/20   03/19/20, MD  promethazine (PHENERGAN) 25 MG suppository Place 1 suppository (25 mg total) rectally every 6 (six) hours as needed for nausea or vomiting. 05/19/20   Moe Graca, 14/4/21, MD  promethazine (PHENERGAN) 25 MG tablet Take 1 tablet (25 mg total) by mouth every 6 (six) hours as needed for nausea or vomiting. 05/19/20   Valentine Barney, 14/4/21, MD  QUEtiapine (SEROQUEL) 400 MG tablet Take 400 mg by mouth at bedtime.  08/24/19   [provider]  SYRINGE-NEEDLE, DISP, 3 ML 25G X 1" 3 ML MISC Use 1 needle per application once weekly 05/30/19   06/01/19, MD  tizanidine (ZANAFLEX) 2 MG capsule Take 2 capsules (4 mg total) by mouth 3 (three) times daily as needed for muscle spasms. 02/29/20   03/02/20, MD  valACYclovir (VALTREX) 1000 MG tablet Take 1 tablet (1,000 mg total) by mouth 3 (three) times daily. Patient taking differently: Take 1,000 mg by mouth 3 (three) times daily as needed (cold sores).  05/25/19   14/9/20 Mary-Margaret, FNP  Vitamin D, Ergocalciferol, (DRISDOL) 1.25 MG (50000 UT) CAPS capsule Take 50,000 Units by mouth once a week. Fridays    [provider]  clonazePAM (KLONOPIN) 1 MG tablet Take 0.5-1 tablets (0.5-1 mg total) by mouth 2 (two) times daily as needed for anxiety. Patient taking differently: Take 1 mg by mouth 2 (two) times daily.  02/29/20 05/19/20  14/4/21, MD    Allergies    Ambien [zolpidem] and Nsaids  Review of Systems   Review of Systems  All other systems reviewed and are negative.   Physical Exam Updated Vital Signs BP 134/76 (BP Location: Right Arm)   Pulse 72  Temp 98.5 F (36.9 C) (Oral)   Resp 16   Ht  (1.626 m)   Wt 117.9 kg   LMP 05/03/2020 Comment: Tubal Ligation as well  SpO2 98%   BMI 44.63 kg/m   Physical Exam Vitals and nursing note reviewed.   Constitutional:      Appearance: She is well-developed.  HENT:     Head: Normocephalic and atraumatic.     Nose: Nose normal. No congestion or rhinorrhea.     Mouth/Throat:     Mouth: Mucous membranes are moist.     Pharynx: Oropharynx is clear.  Eyes:     Pupils: Pupils are equal, round, and reactive to light.  Cardiovascular:     Rate and Rhythm: Normal rate and regular rhythm.  Pulmonary:     Effort: No respiratory distress.     Breath sounds: No stridor.  Abdominal:     General: There is no distension.     Comments: Midline 2 cm incision with some palpable nodules likely related to the stitches and possible seroma formation around those.  No large fluctuant areas no erythema.  No warmth.  No drainage.  Deep palpation of the rest of her abdomen does not show any widespread tenderness or induration  Musculoskeletal:        General: No swelling or tenderness. Normal range of motion.     Cervical back: Normal range of motion.  Skin:    General: Skin is warm and dry.  Neurological:     General: No focal deficit present.     Mental Status: She is alert.     ED Results / Procedures / Treatments   Labs (all labs ordered are listed, but only abnormal results are displayed) Labs Reviewed  COMPREHENSIVE METABOLIC PANEL - Abnormal; Notable for the following components:      Result Value   Potassium 3.3 (*)    Alkaline Phosphatase 148 (*)    Total Bilirubin <0.1 (*)    All other components within normal limits  URINALYSIS, ROUTINE W REFLEX MICROSCOPIC - Abnormal; Notable for the following components:   Specific Gravity, Urine >1.030 (*)    Hgb urine dipstick TRACE (*)    All other components within normal limits  URINALYSIS, MICROSCOPIC (REFLEX) - Abnormal; Notable for the following components:   Bacteria, UA RARE (*)    All other components within normal limits  CBC WITH DIFFERENTIAL/PLATELET  LIPASE, BLOOD    EKG None  Radiology No results  found.  Procedures Procedures (including critical care time)  Medications Ordered in ED Medications  HYDROmorphone (DILAUDID) injection 1 mg (1 mg Intravenous Given 05/19/20 0029)  lactated ringers bolus 1,000 mL ( Intravenous Stopped 05/19/20 0151)  prochlorperazine (COMPAZINE) injection 10 mg (10 mg Intravenous Given 05/19/20 0036)  oxyCODONE (Oxy IR/ROXICODONE) immediate release tablet 10 mg (10 mg Oral Given 05/19/20 0332)  acetaminophen (TYLENOL) tablet 1,000 mg (1,000 mg Oral Given 05/19/20 8119)    ED Course  I have reviewed the triage vital signs and the nursing notes.  Pertinent labs & imaging results that were available during my care of the patient were reviewed by me and considered in my medical decision making (see chart for details).    MDM Rules/Calculators/A&P                          We will check labs and treat symptomatically this time.  Feel she might have a localized seroma and will file this  is a deep abdominal or peritoneal pain that is causing her issues.  We will try Compazine instead of Zofran.  Some fluids.  We will see what her labs look like and decide if she needs any imaging at this time would not be inclined to CT scan her.  Pain improved. Nausea improved. Tolerating PO. Stable for dc w/ surgery follow up.   Of note, Rx sent to multiple pharmacies, one incorrect, one who's system is down (verified over the phone) so she will pick it up at [facility] in [place].   Final Clinical Impression(s) / ED Diagnoses Final diagnoses:  Abdominal pain, unspecified abdominal location  Nausea and vomiting, intractability of vomiting not specified, unspecified vomiting type    Rx / DC Orders ED Discharge Orders         Ordered    oxyCODONE (ROXICODONE) 5 MG immediate release tablet  Every 6 hours PRN,   Status:  Discontinued        05/19/20 0405    promethazine (PHENERGAN) 25 MG suppository  Every 6 hours PRN,   Status:  Discontinued        05/19/20 0405    promethazine  (PHENERGAN) 25 MG tablet  Every 6 hours PRN,   Status:  Discontinued        05/19/20 0405    oxyCODONE (ROXICODONE) 5 MG immediate release tablet  Every 6 hours PRN,   Status:  Discontinued        05/19/20 0410    promethazine (PHENERGAN) 25 MG suppository  Every 6 hours PRN,   Status:  Discontinued        05/19/20 0410    promethazine (PHENERGAN) 25 MG tablet  Every 6 hours PRN,   Status:  Discontinued        05/19/20 0410    promethazine (PHENERGAN) 25 MG tablet  Every 6 hours PRN        05/19/20 0453    oxyCODONE (ROXICODONE) 5 MG immediate release tablet  Every 6 hours PRN        05/19/20 0453    promethazine (PHENERGAN) 25 MG suppository  Every 6 hours PRN        05/19/20 0453           Kensington Rios, Barbara CowerJason, MD 05/19/20 0630

## 2020-05-24 ENCOUNTER — Telehealth: Payer: Self-pay

## 2020-05-24 DIAGNOSIS — Z9884 Bariatric surgery status: Secondary | ICD-10-CM | POA: Diagnosis not present

## 2020-05-24 DIAGNOSIS — Z79899 Other long term (current) drug therapy: Secondary | ICD-10-CM | POA: Diagnosis not present

## 2020-05-24 DIAGNOSIS — R1084 Generalized abdominal pain: Secondary | ICD-10-CM | POA: Diagnosis not present

## 2020-05-24 DIAGNOSIS — R7989 Other specified abnormal findings of blood chemistry: Secondary | ICD-10-CM | POA: Diagnosis not present

## 2020-05-24 DIAGNOSIS — Z888 Allergy status to other drugs, medicaments and biological substances status: Secondary | ICD-10-CM | POA: Diagnosis not present

## 2020-05-24 DIAGNOSIS — R112 Nausea with vomiting, unspecified: Secondary | ICD-10-CM | POA: Diagnosis not present

## 2020-05-24 DIAGNOSIS — Z886 Allergy status to analgesic agent status: Secondary | ICD-10-CM | POA: Diagnosis not present

## 2020-05-24 DIAGNOSIS — F319 Bipolar disorder, unspecified: Secondary | ICD-10-CM | POA: Diagnosis not present

## 2020-05-24 DIAGNOSIS — F410 Panic disorder [episodic paroxysmal anxiety] without agoraphobia: Secondary | ICD-10-CM | POA: Diagnosis not present

## 2020-05-24 DIAGNOSIS — Z9889 Other specified postprocedural states: Secondary | ICD-10-CM | POA: Diagnosis not present

## 2020-05-24 NOTE — Telephone Encounter (Signed)
Can we use same day for tomorrow?  Nurse Assessment Nurse: Yetta Barre, RN, Miranda Date/Time (Eastern Time): 05/24/2020 2:39:31 PM Confirm and document reason for call. If symptomatic, describe symptoms. ---Caller states she surgery Nov 18th to remove "excessive limb" from her bariatric surgery. She is having pain at the incision site especially when she bends over. The pain radiates down both legs. She was seen in the ED on Friday because she had 2 knots around her incision and because of the pain. She was told they were seromas and would go away. One has not. She was given pain medication and muscle relaxers. She was told to follow up with her surgeon and if she could not to follow up with her PCP. She has spoken to her surgeon's office and they cannot see her until the 16th. They told her some home care things to do but they are not helping. Does the patient have any new or worsening symptoms? ---Yes Will a triage be completed? ---Yes Related visit to physician within the last 2 weeks? ---Yes Does the PT have any chronic conditions? (i.e. diabetes, asthma, this includes High risk factors for pregnancy, etc.) ---Yes List chronic conditions. ---Fibromyalgia, Bursitis, Bi-polar, Panic, Gastric bypass Is the patient pregnant or possibly pregnant? (Ask all females between the ages of 79-55) ---No Is this a behavioral health or substance abuse call? ---No Guidelines Guideline Title Affirmed Question Affirmed Notes Nurse Date/Time (Eastern Time) Post-Op Symptoms and Questions [1] SEVERE post-op pain (e.g., excruciating, Yetta Barre, RN, Miranda 05/24/2020 2:45:20 PM PLEASE NOTE: All timestamps contained within this report are represented as [place] Standard Time. CONFIDENTIALTY NOTICE: This fax transmission is intended only for the addressee. It contains information that is legally privileged, confidential or otherwise protected from use or disclosure. If you are not the intended recipient, you  are strictly prohibited from reviewing, disclosing, copying using or disseminating any of this information or taking any action in reliance on or regarding this information. If you have received this fax in error, please notify us immediately by telephone so that we can arrange for its return to [place]. Phone: [phone], Toll-Free: [phone], Fax: [phone] Page: 2 of 2 Call Id: [id number] Guidelines Guideline Title Affirmed Question Affirmed Notes Nurse Date/Time Lamount Cohen Time) pain scale 8-10) AND [2] not controlled with pain medications Disp. Time Lamount Cohen Time) Disposition Final User 05/24/2020 2:56:18 PM Call PCP Now Yes Yetta Barre, RN, Miranda Caller Disagree/Comply Comply Caller Understands Yes PreDisposition Call Doctor Care Advice Given Per Guideline CALL PCP NOW: * You need to discuss this with your doctor (or NP/PA). * I'll page the on-call provider now. If you haven't heard from the provider (or me) within 30 minutes, call again. CARE ADVICE per Post-Op Symptoms and Questions (Adult) guideline. ALTERNATE DISPOSITION - CALL SURGEON NOW: * For most post-operative symptoms and questions, it is better to speak with the surgeon than the PCP. CALL BACK IF: * Fever over 100.4 F (38.0 C) * You become worse Comments User: Grier Rocher, RN Date/Time Lamount Cohen Time): 05/24/2020 3:09:26 PM RN spoke with Shanda Bumps at the office. Dr. Mardelle Matte has left for the day. Unable to ask if she is ok with seeing the patient. Appt for 3pm tomorrow put on hold for the patient and pt told to call back tomorrow morning at 8am for further discussion. Shanda Bumps will leave a message for Dr. Mardelle Matte to see if she is ok with seeing the pt

## 2020-05-24 NOTE — Telephone Encounter (Signed)
Lvm letting patient know she is scheduled for 3 pm tomorrow.

## 2020-05-24 NOTE — Telephone Encounter (Signed)
Yes

## 2020-05-25 ENCOUNTER — Emergency Department (HOSPITAL_COMMUNITY): Payer: BC Managed Care – PPO

## 2020-05-25 ENCOUNTER — Encounter: Payer: Self-pay | Admitting: Family Medicine

## 2020-05-25 ENCOUNTER — Other Ambulatory Visit: Payer: Self-pay

## 2020-05-25 ENCOUNTER — Encounter (HOSPITAL_COMMUNITY): Payer: Self-pay

## 2020-05-25 ENCOUNTER — Emergency Department (HOSPITAL_COMMUNITY)
Admission: EM | Admit: 2020-05-25 | Discharge: 2020-05-25 | Disposition: A | Discharge status: Home / Self Care | Payer: BC Managed Care – PPO | Attending: Emergency Medicine | Admitting: Emergency Medicine

## 2020-05-25 ENCOUNTER — Ambulatory Visit: Payer: BC Managed Care – PPO | Admitting: Family Medicine

## 2020-05-25 DIAGNOSIS — K219 Gastro-esophageal reflux disease without esophagitis: Secondary | ICD-10-CM | POA: Insufficient documentation

## 2020-05-25 DIAGNOSIS — R112 Nausea with vomiting, unspecified: Secondary | ICD-10-CM | POA: Diagnosis not present

## 2020-05-25 DIAGNOSIS — I10 Essential (primary) hypertension: Secondary | ICD-10-CM | POA: Diagnosis not present

## 2020-05-25 DIAGNOSIS — R1031 Right lower quadrant pain: Secondary | ICD-10-CM | POA: Diagnosis not present

## 2020-05-25 DIAGNOSIS — E039 Hypothyroidism, unspecified: Secondary | ICD-10-CM | POA: Diagnosis not present

## 2020-05-25 LAB — COMPREHENSIVE METABOLIC PANEL
ALT: 12 U/L (ref 0–44)
AST: 18 U/L (ref 15–41)
Albumin: 4.2 g/dL (ref 3.5–5.0)
Alkaline Phosphatase: 156 U/L — ABNORMAL HIGH (ref 38–126)
Anion gap: 14 (ref 5–15)
BUN: 11 mg/dL (ref 6–20)
CO2: 23 mmol/L (ref 22–32)
Calcium: 9 mg/dL (ref 8.9–10.3)
Chloride: 103 mmol/L (ref 98–111)
Creatinine, Ser: 0.66 mg/dL (ref 0.44–1.00)
GFR, Estimated: >60 mL/min (ref >60)
Glucose, Bld: 83 mg/dL (ref 70–99)
Potassium: 3.2 mmol/L — ABNORMAL LOW (ref 3.5–5.1)
Sodium: 140 mmol/L (ref 135–145)
Total Bilirubin: 0.5 mg/dL (ref 0.3–1.2)
Total Protein: 7.4 g/dL (ref 6.5–8.1)

## 2020-05-25 LAB — URINALYSIS, ROUTINE W REFLEX MICROSCOPIC
Bacteria, UA: NONE SEEN
Bilirubin Urine: NEGATIVE
Glucose, UA: NEGATIVE mg/dL
Ketones, ur: NEGATIVE mg/dL
Nitrite: NEGATIVE
Protein, ur: NEGATIVE mg/dL
Specific Gravity, Urine: 1.015 (ref 1.005–1.030)
pH: 5 (ref 5.0–8.0)

## 2020-05-25 LAB — CBC WITH DIFFERENTIAL/PLATELET
Abs Immature Granulocytes: 0.04 10*3/uL (ref 0.00–0.07)
Basophils Absolute: 0.1 10*3/uL (ref 0.0–0.1)
Basophils Relative: 0 %
Eosinophils Absolute: 0.1 10*3/uL (ref 0.0–0.5)
Eosinophils Relative: 1 %
HCT: 41 % (ref 36.0–46.0)
Hemoglobin: 13.3 g/dL (ref 12.0–15.0)
Immature Granulocytes: 0 %
Lymphocytes Relative: 24 %
Lymphs Abs: 2.9 10*3/uL (ref 0.7–4.0)
MCH: 26.8 pg (ref 26.0–34.0)
MCHC: 32.4 g/dL (ref 30.0–36.0)
MCV: 82.5 fL (ref 80.0–100.0)
Monocytes Absolute: 0.8 10*3/uL (ref 0.1–1.0)
Monocytes Relative: 6 %
Neutro Abs: 8.1 10*3/uL — ABNORMAL HIGH (ref 1.7–7.7)
Neutrophils Relative %: 69 %
Platelets: 321 10*3/uL (ref 150–400)
RBC: 4.97 MIL/uL (ref 3.87–5.11)
RDW: 15.4 % (ref 11.5–15.5)
WBC: 12 10*3/uL — ABNORMAL HIGH (ref 4.0–10.5)
nRBC: 0 % (ref 0.0–0.2)

## 2020-05-25 LAB — I-STAT BETA HCG BLOOD, ED (MC, WL, AP ONLY)
I-stat hCG, quantitative: <5 m[IU]/mL (ref <5)
I-stat hCG, quantitative: <5 m[IU]/mL (ref <5)

## 2020-05-25 LAB — LIPASE, BLOOD: Lipase: 24 U/L (ref 11–51)

## 2020-05-25 LAB — CBG MONITORING, ED: Glucose-Capillary: 88 mg/dL (ref 70–99)

## 2020-05-25 IMAGING — CT CT ABD-PELV W/ CM
2 of 5 series · 16 of 46 positions shown, 18 images · IV contrast (omnipaque)
Comparison: CT abdomen and pelvis [DATE]

CLINICAL DATA: 33-year-old with right lower abdominal pain. History
of Roux-en-Y gastric bypass procedure. Patient underwent resection
of the redundant Roux limb blind end on [DATE].

EXAM:
CT ABDOMEN AND PELVIS WITH CONTRAST
TECHNIQUE: Multidetector CT imaging of the abdomen and pelvis was performed
using the standard protocol following bolus administration of
intravenous contrast.
CONTRAST:  100mL OMNIPAQUE IOHEXOL 300 MG/ML  SOLN

[Series 2: axial st · axial · 0.85mm/px · z∈[-528,-84]mm · 13 of 103 slices shown, 15 images]
[im 7/103  soft-tissue]
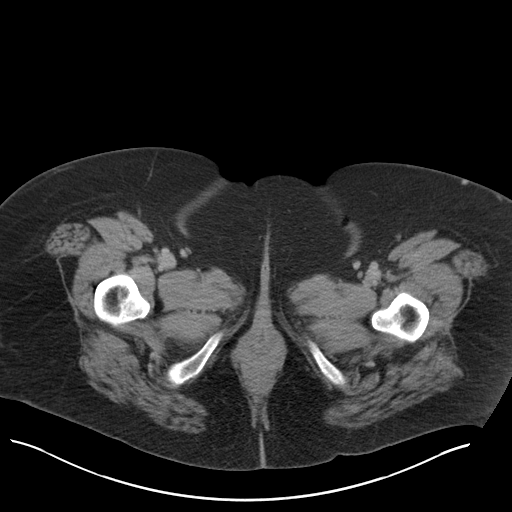
[im 7/103  bone]
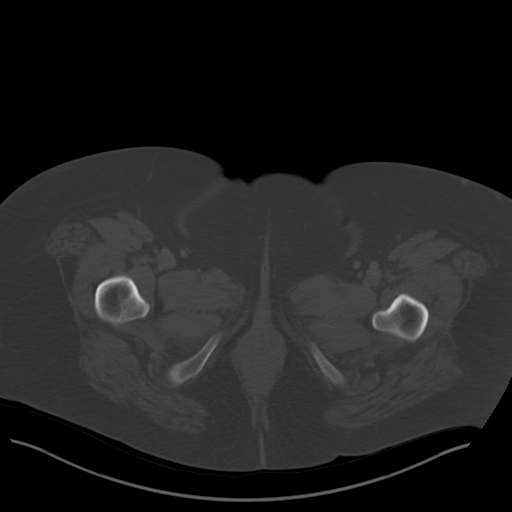
[im 14/103  soft-tissue]
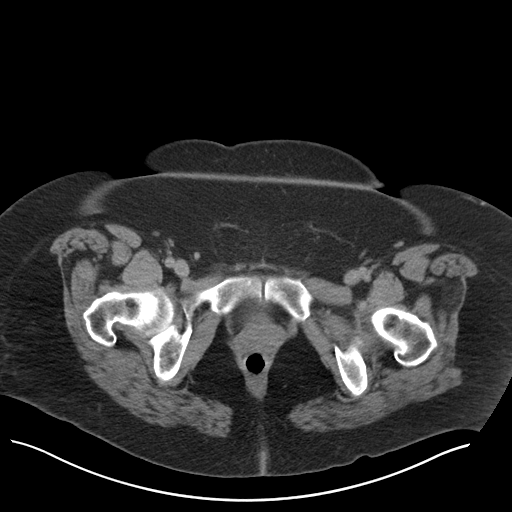
[im 21/103  soft-tissue]
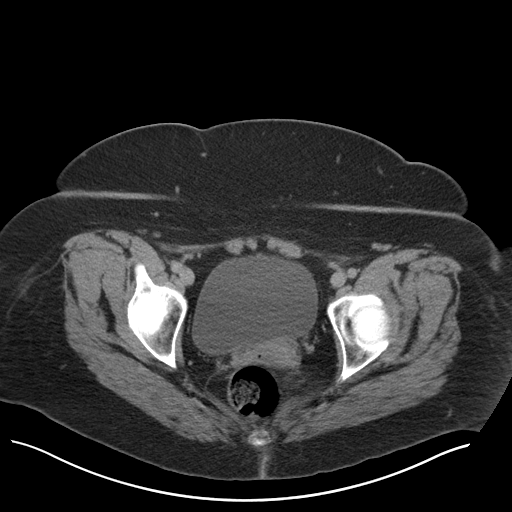
[im 28/103  soft-tissue]
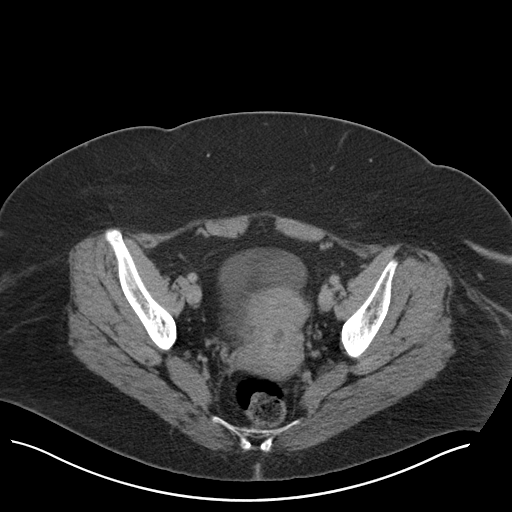
[im 35/103  soft-tissue]
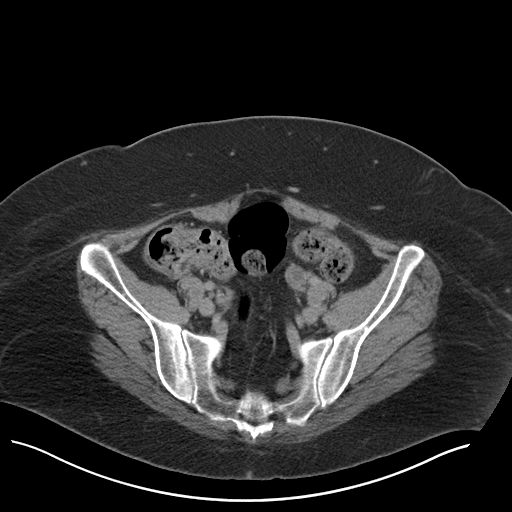
[im 41/103  soft-tissue]
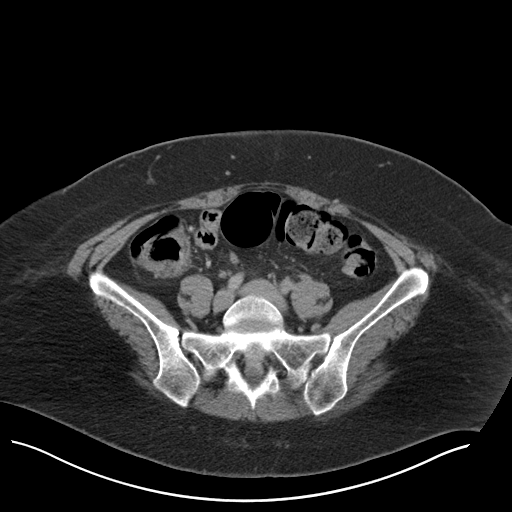
[im 55/103  soft-tissue]
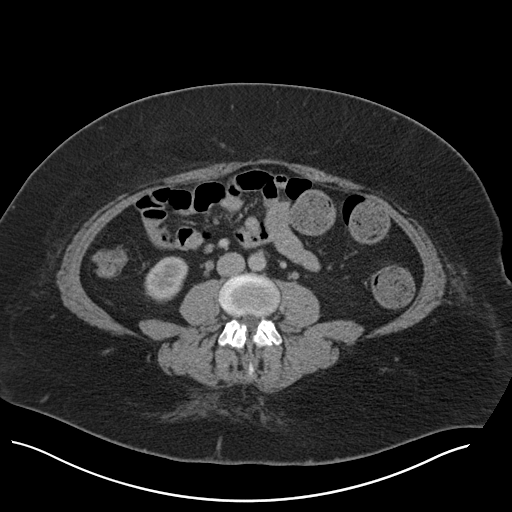
[im 62/103  soft-tissue]
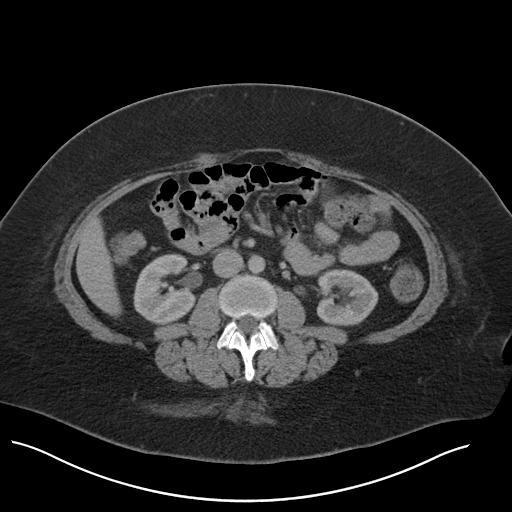
[im 69/103  soft-tissue]
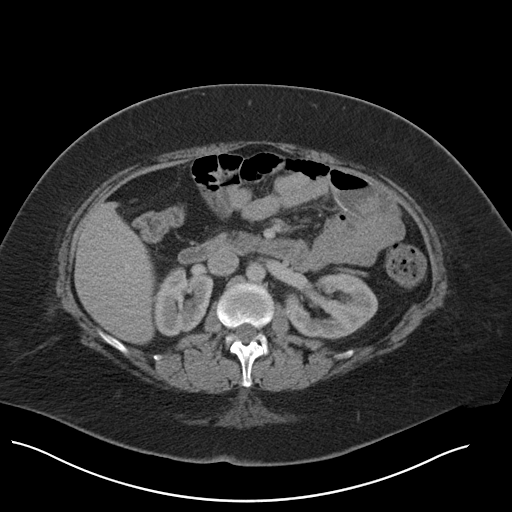
[im 69/103  bone]
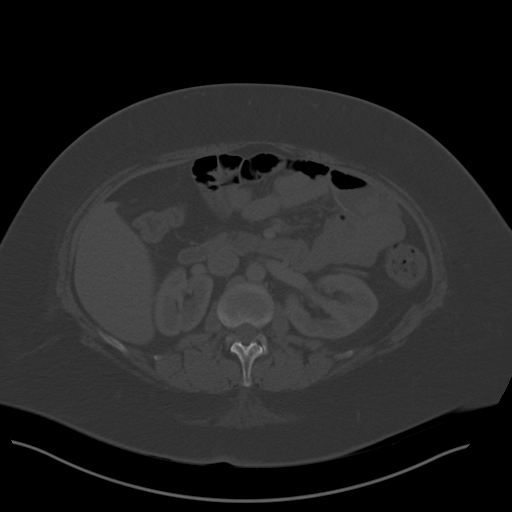
[im 75/103  soft-tissue]
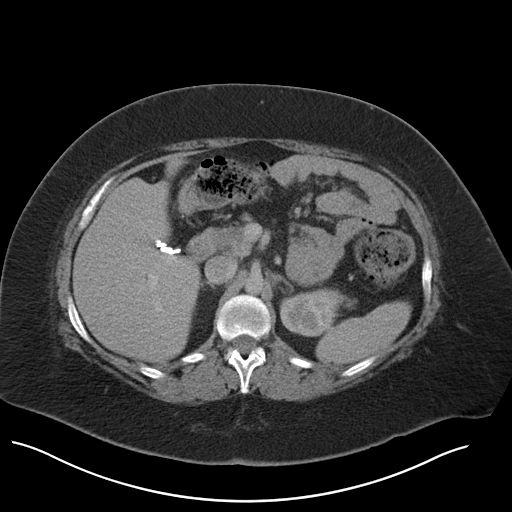
[im 82/103  soft-tissue]
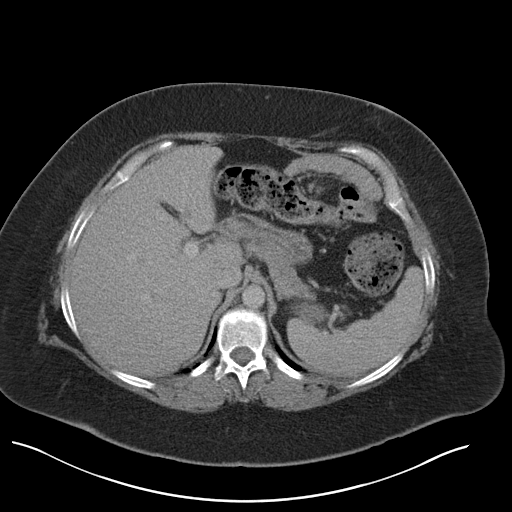
[im 89/103  soft-tissue]
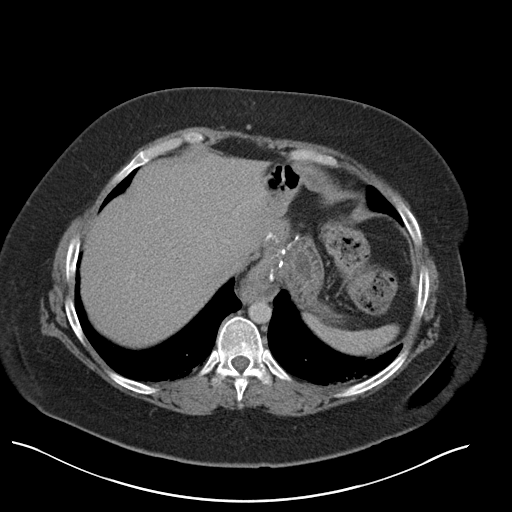
[im 96/103  soft-tissue]
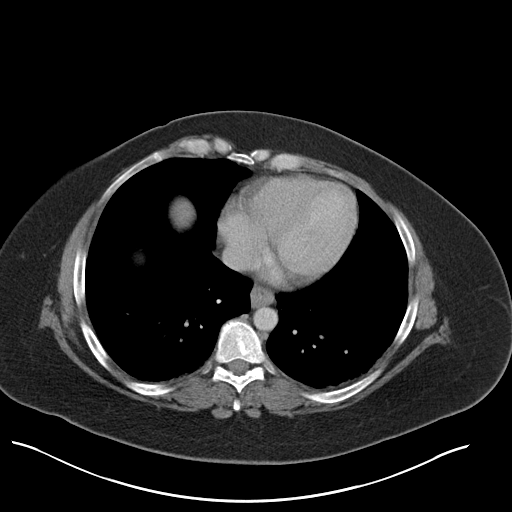

[Series 4: coronal st · coronal · 0.94mm/px · 3 of 158 slices shown]
[im 53/158  soft-tissue]
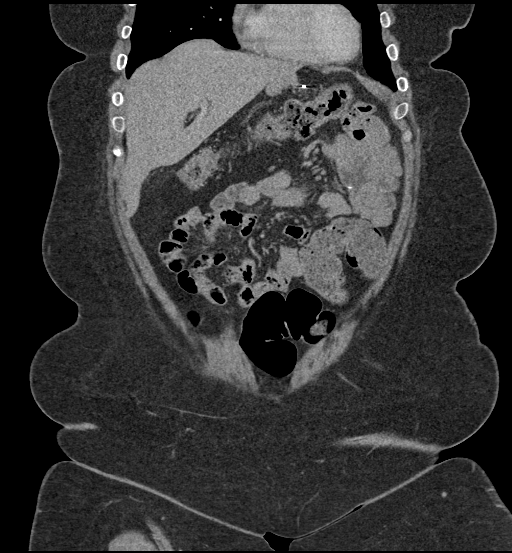
[im 70/158  soft-tissue]
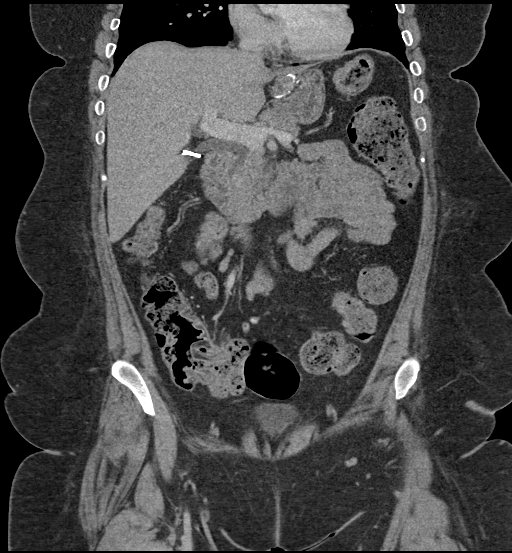
[im 88/158  soft-tissue]
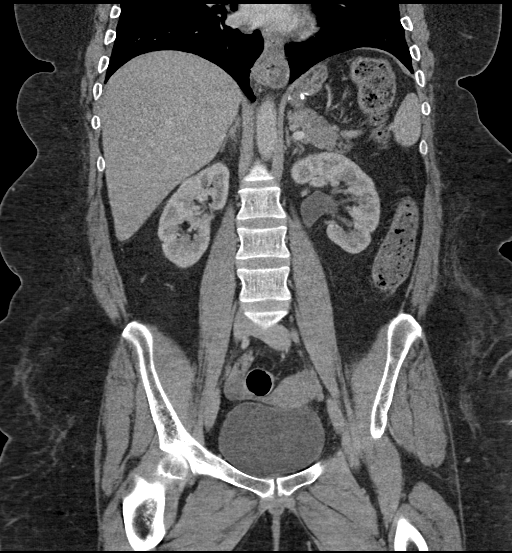

[16 of 46 positions shown; findings below may reference images not displayed]

FINDINGS: Lower chest: Dependent lung densities are suggestive atelectasis. No
pleural effusions.

Hepatobiliary: Cholecystectomy. No fluid at the gallbladder fossa.
Normal appearance of the liver without biliary dilatation. Main
portal venous system is patent.

Pancreas: Unremarkable. No pancreatic ductal dilatation or
surrounding inflammatory changes.

Spleen: Normal in size without focal abnormality.

Adrenals/Urinary Tract: Normal adrenal glands. Left extrarenal
pelvis is stable. Small extrarenal pelvis on the right side. No
suspicious renal lesions. No hydronephrosis.

Stomach/Bowel: Again noted is a surgical clip in the pelvis. Normal
appearance of the colon. The appendix has high-density material but
no evidence for inflammation. Appendix is near the posterior
midline. Again noted are changes compatible with history gastric
bypass Roux-en-Y procedure. Evaluation of the Roux-en-Y is somewhat
limited due to the lack of oral contrast but there is no dilatation
of small bowel loops in the upper abdomen or adjacent to the
stomach. Again noted is a small hiatal hernia. No acute bowel
inflammation or bowel obstruction.

Vascular/Lymphatic: No significant vascular findings are present. No
enlarged abdominal or pelvic lymph nodes.

Reproductive: New 3.3 cm low-density structure in the region of the
right ovary probably represents ovarian cyst. Normal appearance of
the uterus and left adnexa.

Other: Negative for free fluid. Negative for free air. Soft tissue
changes in the anterior subcutaneous tissues compatible with recent
surgery.

Musculoskeletal: No acute bone abnormality.
IMPRESSION: 1. No acute abnormality in the abdomen or pelvis.
2. Probable small right ovarian cyst or dominant follicle. It is
possible that this structure could be related to the patient's
symptoms.
3. Postsurgical changes and history of gastric bypass procedure with
Roux-en-Y. Patient had recent Roux limb reduction. No evidence for a
postsurgical complication or bowel obstruction.

## 2020-05-25 MED ORDER — SODIUM CHLORIDE (PF) 0.9 % IJ SOLN
INTRAMUSCULAR | Status: AC
  Filled 2020-05-25: qty 50

## 2020-05-25 MED ORDER — SODIUM CHLORIDE 0.9 % IV BOLUS: 1000.0000 mL | Freq: Once | INTRAVENOUS | Status: DC

## 2020-05-25 MED ORDER — HYDROMORPHONE HCL 1 MG/ML IJ SOLN
1.0000 mg | Freq: Once | INTRAMUSCULAR | Status: AC
  Administered 2020-05-25: 1 mg via INTRAVENOUS
  Filled 2020-05-25: qty 1

## 2020-05-25 MED ORDER — HYDROMORPHONE HCL 1 MG/ML IJ SOLN: 0.5000 mg | Freq: Once | INTRAMUSCULAR | Status: DC

## 2020-05-25 MED ORDER — IOHEXOL 300 MG/ML  SOLN
100.0000 mL | Freq: Once | INTRAMUSCULAR | Status: AC | PRN
  Administered 2020-05-25: 100 mL via INTRAVENOUS

## 2020-05-25 MED ORDER — PROCHLORPERAZINE EDISYLATE 10 MG/2ML IJ SOLN
10.0000 mg | Freq: Once | INTRAMUSCULAR | Status: AC
  Administered 2020-05-25: 10 mg via INTRAVENOUS
  Filled 2020-05-25: qty 2

## 2020-05-25 MED ORDER — FAMOTIDINE IN NACL 20-0.9 MG/50ML-% IV SOLN
20.0000 mg | Freq: Once | INTRAVENOUS | Status: AC
  Administered 2020-05-25: 20 mg via INTRAVENOUS
  Filled 2020-05-25: qty 50

## 2020-05-25 MED ORDER — LACTATED RINGERS IV BOLUS
1000.0000 mL | Freq: Once | INTRAVENOUS | Status: AC
  Administered 2020-05-25: 1000 mL via INTRAVENOUS

## 2020-05-25 NOTE — Discharge Instructions (Signed)
Your CT scan was reassuringly without any evidence of abscesses or any other acute abnormality.  He does have a small cyst however this is quite low for where your pain is and I have a low suspicion for this causing her symptoms however Tylenol 1000 mg every 4-6 hours should be helpful for cyst related pain.  Please follow-up with your general surgeon.  Please do plenty of water.  Use Zofran or Phenergan as needed for nausea.  You may always return to the ER for any new or concerning symptoms.

## 2020-05-25 NOTE — ED Provider Notes (Signed)
[facility] DEPT Provider Note   CSN: [id number] Arrival date & time: 05/25/20  0601     History Chief Complaint  Patient presents with  . Abdominal Pain    [name] [name] is a 34 y.o. female.  HPI Patient is a 34 year old female with a extensive past medical history detailed below presented today for right lower quadrant abdominal pain which is been ongoing and progressively worsening since 11/19 when she had a resection of her Roux-en-Y bypass surgery that was conducted 2014.  Patient surgery was conducted by Dr. Windle Guard.  She has a follow-up appointment late next week however she states that for the past 3 days she has had nausea and vomiting.  She is unable to quantify how many episodes of vomiting per day however she states that she is at difficulty bending down.  She has been able to take the Phenergan she is prescribed although she states it is not working well for her as she vomits 20 minutes after eating.  She endorses nonbloody nonbilious emesis.  She states she has achy constant right lower quadrant abdominal pain that is better with lying down worse with movement.  She states that she was seen 11/25 for similar symptoms and had a CT scan at that time.   She denies any fevers, chills, bloody stool, changes in bowel movements, urinary symptoms, chest pain, shortness of breath, cough or congestion.  She denies any vaginal discharge.    Past Medical History:  Diagnosis Date  . ADD (attention deficit disorder)   . Anemia   . Anxiety   . B12 deficiency   . Back pain   . Bilateral swelling of feet   . Bipolar 2 disorder ([place]) 09/19/2019  . Constipation   . DEPRESSION   . Elevated cholesterol   . Family history of colon cancer 01-Jun-2019   Father, 49s; deceased.   . Fatty liver   . Fibromyalgia   . Gallbladder problem   . GERD (gastroesophageal reflux disease)   . Heartburn   . History of stomach ulcers   . Hypertension   .  Hypothyroidism    Hx of, normalized TSH during pregnancy 2011  . Infertility, female   . Lactose intolerance   . Migraines   . Morbid obesity ([place])    s/p RY 08/2012 - start weight 290 pounds  . Nephrolithiasis   . Palpitations   . Panic disorder   . PCOS (polycystic ovarian syndrome)   . PONV (postoperative nausea and vomiting)   . Pregnancy induced hypertension   . Shortness of breath   . Vitamin D deficiency     Patient Active Problem List   Diagnosis Date Noted  . Abdominal pain 05/03/2020  . Dehydration 05/03/2020  . Nausea & vomiting 05/03/2020  . Intussusception of small intestine ([place]) 05/03/2020  . Plantar fasciitis 10/31/2019  . Bipolar 2 disorder ([place]) 09/19/2019  . Fibromyalgia 09/09/2019  . Ischial bursitis of left side 07/19/2019  . Vitamin B12 deficiency 06/01/2019  . Vitamin D deficiency 06/01/2019  . Panic disorder 06/01/2019  . Family history of colon cancer 2019-06-01  . Chronic constipation 2019-06-01  . Major depression, recurrent, chronic ([place]) Jun 01, 2019  . Insomnia due to other mental disorder 03/13/2019  . Pain in both lower extremities 11/21/2018  . Nocturnal leg cramps 10/26/2018  . Nephrolithiasis   . Reduced libido, due to Lexapro 07/09/2017  . Binge eating disorder 04/23/2017  . Morbid obesity ([place]) 04/23/2017  . Acquired iron deficiency anemia  due to decreased absorption 02/22/2015  . 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 Barbie Banner OSTEOTOMY Left 06/24/2017   Procedure: [employer];  Surgeon: Trula Slade, DPM;  Location: [facility];  Service: Podiatry;  Laterality: Left;  . BUNIONECTOMY Left 06/24/2017   Procedure: Annye English;  Surgeon: Trula Slade, DPM;  Location: [facility];  Service: Podiatry;  Laterality: Left;  . CESAREAN SECTION     x 2  . CHOLECYSTECTOMY N/A 02/15/2019   Procedure: LAPAROSCOPIC CHOLECYSTECTOMY  WITH INTRAOPERATIVE CHOLANGIOGRAM;  Surgeon: Ileana Roup, MD;  Location: [facility];  Service: General;  Laterality: N/A;  . COLON RESECTION N/A 05/04/2020   Procedure: DIAGNOSTIC LAPAROSCOPY, RESECTION OF CANDYCANE, UPPER ENDOSCOPY;  Surgeon: Clovis Riley, MD;  Location: [facility];  Service: General;  Laterality: N/A;  . FOOT SURGERY    . 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   Procedure: LAPAROSCOPIC ROUX-EN-Y GASTRIC;  Surgeon: Gayland Curry, MD;  Location: [facility];  Service: General;  Laterality: N/A;  laparoscopic roux-en-y gastric bypass  . LITHOTRIPSY Left   . TONSILLECTOMY    . TUBAL LIGATION    . UPPER GI ENDOSCOPY  08/24/2012   Procedure: UPPER GI ENDOSCOPY;  Surgeon: Gayland Curry, MD;  Location: [facility];  Service: General;;  . WISDOM TOOTH EXTRACTION       OB History    Gravida  2   Para  2   Term  2   Preterm  0   AB  0   Living  2     SAB  0   IAB  0   Ectopic  0   Multiple  0   Live Births  2           Family History  Problem Relation Age of Onset  . Hyperlipidemia Father   . Hypertension Father   . Kidney disease Father   . Colon cancer Father 59  . Cancer Father   . Depression Father   . Anxiety disorder Father   . Bipolar disorder Father   . Sleep apnea Father   . Obesity Father   . Hypertension Mother   . Depression Mother   . Anxiety disorder Mother   . Obesity Mother   . Hypertension Maternal Grandmother   . Uterine cancer Maternal Grandmother 76  . Diabetes Maternal Grandfather     Social History   Tobacco Use  . Smoking status: Never Smoker  . Smokeless tobacco: Never Used  Vaping Use  . Vaping Use: Never used  Substance Use Topics  . Alcohol use: Yes    Comment: rare/socially  . Drug use: No    Home Medications Prior to Admission medications   Medication Sig Start Date End Date Taking? Authorizing Provider  acetaminophen (TYLENOL) 500 MG tablet Take 2 tablets (1,000 mg total) by mouth every 6 (six) hours as needed  for moderate pain. 02/13/19   Domenic Moras, PA-C  albuterol (VENTOLIN HFA) 108 (90 Base) MCG/ACT inhaler Inhale 2 puffs into the lungs every 4 (four) hours as needed for wheezing or shortness of breath.  02/21/20   [provider]  busPIRone (BUSPAR) 15 MG tablet Take 15 mg by mouth 3 (three) times daily. 11/01/19   [provider]  clotrimazole (MYCELEX) 10 MG troche Take 1 tablet (10 mg total) by mouth 5 (five) times daily. Patient taking differently: Take 10 mg by mouth daily  as needed (trush).  04/10/20   Lucretia Kern, DO  cyanocobalamin (,VITAMIN B-12,) 1000 MCG/ML injection Inject 1 vial per week for 3 weeks, then 1 vial per month thereafter Patient taking differently: Inject 100 mcg into the skin once a week. Tuesdays 05/30/19   Leamon Arnt, MD  diclofenac Sodium (VOLTAREN) 1 % GEL Apply 4 g topically 4 (four) times daily. Patient taking differently: Apply 4 g topically 4 (four) times daily as needed (pain).  05/13/19   Palumbo, April, MD  docusate sodium (COLACE) 100 MG capsule Take 1 capsule (100 mg total) by mouth 2 (two) times daily. 05/06/20   Rolm Bookbinder, MD  FIBER ADULT GUMMIES PO Take 4 tablets by mouth daily.     [provider]  hydrOXYzine (VISTARIL) 50 MG capsule Take 50 mg by mouth in the morning, at noon, and at bedtime.  08/24/19   [provider]  lamoTRIgine (LAMICTAL) 150 MG tablet Take 300 mg by mouth daily.  04/10/20   [provider]  lithium carbonate (LITHOBID) 300 MG CR tablet Take 600 mg by mouth at bedtime.    [provider]  Magnesium 250 MG TABS Take 500 mg by mouth at bedtime.     [provider]  metFORMIN (GLUCOPHAGE) 500 MG tablet Take 500 mg by mouth 2 (two) times daily. 11/01/19   [provider]  Multiple Vitamins-Minerals (MULTIVITAMIN ADULT) CHEW Chew 2 tablets by mouth daily.    [provider]  ondansetron (ZOFRAN ODT) 4 MG disintegrating tablet 34m ODT q4 hours prn  nausea/vomit Patient taking differently: Take 4 mg by mouth every hour as needed for nausea or vomiting.  10/13/19   FJacqlyn Larsen PA-C  ondansetron (ZOFRAN) 4 MG tablet Take 1 tablet (4 mg total) by mouth every 8 (eight) hours as needed for nausea. 05/06/20   WRolm Bookbinder MD  oxyCODONE (ROXICODONE) 5 MG immediate release tablet Take 1 tablet (5 mg total) by mouth every 6 (six) hours as needed for severe pain. 05/19/20   Mesner, JCorene Cornea MD  pregabalin (LYRICA) 75 MG capsule Take 1 capsule (75 mg total) by mouth 2 (two) times daily. 01/18/20   CGregor Hams MD  promethazine (PHENERGAN) 25 MG suppository Place 1 suppository (25 mg total) rectally every 6 (six) hours as needed for nausea or vomiting. 05/19/20   Mesner, JCorene Cornea MD  promethazine (PHENERGAN) 25 MG tablet Take 1 tablet (25 mg total) by mouth every 6 (six) hours as needed for nausea or vomiting. 05/19/20   Mesner, JCorene Cornea MD  QUEtiapine (SEROQUEL) 400 MG tablet Take 400 mg by mouth at bedtime.  08/24/19   [provider]  SYRINGE-NEEDLE, DISP, 3 ML 25G X 1" 3 ML MISC Use 1 needle per application once weekly 05/30/19   ALeamon Arnt MD  tizanidine (ZANAFLEX) 2 MG capsule Take 2 capsules (4 mg total) by mouth 3 (three) times daily as needed for muscle spasms. 02/29/20   CGregor Hams MD  valACYclovir (VALTREX) 1000 MG tablet Take 1 tablet (1,000 mg total) by mouth 3 (three) times daily. Patient taking differently: Take 1,000 mg by mouth 3 (three) times daily as needed (cold sores).  05/25/19   MHassell DoneMary-Margaret, FNP  Vitamin D, Ergocalciferol, (DRISDOL) 1.25 MG (50000 UT) CAPS capsule Take 50,000 Units by mouth once a week. Fridays    [provider]  clonazePAM (KLONOPIN) 1 MG tablet Take 0.5-1 tablets (0.5-1 mg total) by mouth 2 (two) times daily as needed for  anxiety. Patient taking differently: Take 1 mg by mouth 2 (two) times daily.  02/29/20 05/19/20  Leamon Arnt, MD    Allergies    Nsaids, Other, and  Zolpidem  Review of Systems   Review of Systems  Constitutional: Negative for chills and fever.  HENT: Negative for congestion and ear pain.   Eyes: Negative for pain.  Respiratory: Negative for cough and shortness of breath.   Cardiovascular: Negative for chest pain.  Gastrointestinal: Positive for abdominal pain, nausea and vomiting. Negative for blood in stool, constipation and diarrhea.  Endocrine: Negative for polydipsia and polyuria.  Genitourinary: Negative for difficulty urinating, dysuria and hematuria.  Musculoskeletal: Negative for myalgias.  Neurological: Negative for dizziness and headaches.  Psychiatric/Behavioral: Negative for agitation.    Physical Exam Updated Vital Signs BP (!) 141/72   Pulse 96   Temp 98.4 F (36.9 C) (Oral)   Resp 16   Ht 5' 4"  (1.626 m)   Wt 117.9 kg   LMP 05/03/2020 Comment: Negative HCG blood test 12-10-2021Tubal Ligation as well  SpO2 98%   BMI 44.63 kg/m   Physical Exam Vitals and nursing note reviewed.  Constitutional:      General: She is not in acute distress.    Appearance: She is obese.     Comments: Patient is pleasant 34 year old female appears stated age.  Appears uncomfortable.  HENT:     Head: Normocephalic and atraumatic.     Nose: Nose normal.  Eyes:     General: No scleral icterus. Cardiovascular:     Rate and Rhythm: Normal rate and regular rhythm.     Pulses: Normal pulses.     Heart sounds: Normal heart sounds.  Pulmonary:     Effort: Pulmonary effort is normal. No respiratory distress.  Abdominal:     Palpations: Abdomen is soft.     Tenderness: There is no abdominal tenderness.     Comments: Soft, obese abdomen.  No tenderness with light palpation. Tenderness palpation of the right lower quadrant and umbilicus with deep palpation.  No guarding, no rebound, negative heel jar.  Musculoskeletal:     Cervical back: Normal range of motion.     Right lower leg: No edema.     Left lower leg: No edema.   Skin:    General: Skin is warm and dry.     Capillary Refill: Capillary refill takes less than 2 seconds.  Neurological:     Mental Status: She is alert. Mental status is at baseline.  Psychiatric:        Mood and Affect: Mood normal.        Behavior: Behavior normal.     ED Results / Procedures / Treatments   Labs (all labs ordered are listed, but only abnormal results are displayed) Labs Reviewed  COMPREHENSIVE METABOLIC PANEL - Abnormal; Notable for the following components:      Result Value   Potassium 3.2 (*)    Alkaline Phosphatase 156 (*)    All other components within normal limits  CBC WITH DIFFERENTIAL/PLATELET - Abnormal; Notable for the following components:   WBC 12.0 (*)    Neutro Abs 8.1 (*)    All other components within normal limits  URINALYSIS, ROUTINE W REFLEX MICROSCOPIC - Abnormal; Notable for the following components:   APPearance HAZY (*)    Hgb urine dipstick SMALL (*)    Leukocytes,Ua TRACE (*)    All other components within normal limits  LIPASE, BLOOD  I-STAT BETA HCG BLOOD,  ED ([facility], [facility], [facility] ONLY)  CBG MONITORING, ED  I-STAT BETA HCG BLOOD, ED ([facility], [facility], [facility] ONLY)    EKG None  Radiology CT ABDOMEN PELVIS W CONTRAST  Result Date: 05/25/2020 CLINICAL DATA:  34 year old with right lower abdominal pain. History of Roux-en-Y gastric bypass procedure. Patient underwent resection of the redundant Roux limb blind end on 05/04/2020. EXAM: CT ABDOMEN AND PELVIS WITH CONTRAST TECHNIQUE: Multidetector CT imaging of the abdomen and pelvis was performed using the standard protocol following bolus administration of intravenous contrast. CONTRAST:  163m OMNIPAQUE IOHEXOL 300 MG/ML  SOLN COMPARISON:  CT abdomen and pelvis 05/03/2020 FINDINGS: Lower chest: Dependent lung densities are suggestive atelectasis. No pleural effusions. Hepatobiliary: Cholecystectomy. No fluid at the gallbladder fossa. Normal appearance of the liver without biliary dilatation. Main portal  venous system is patent. Pancreas: Unremarkable. No pancreatic ductal dilatation or surrounding inflammatory changes. Spleen: Normal in size without focal abnormality. Adrenals/Urinary Tract: Normal adrenal glands. Left extrarenal pelvis is stable. Small extrarenal pelvis on the right side. No suspicious renal lesions. No hydronephrosis. Stomach/Bowel: Again noted is a surgical clip in the pelvis. Normal appearance of the colon. The appendix has high-density material but no evidence for inflammation. Appendix is near the posterior midline. Again noted are changes compatible with history gastric bypass Roux-en-Y procedure. Evaluation of the Roux-en-Y is somewhat limited due to the lack of oral contrast but there is no dilatation of small bowel loops in the upper abdomen or adjacent to the stomach. Again noted is a small hiatal hernia. No acute bowel inflammation or bowel obstruction. Vascular/Lymphatic: No significant vascular findings are present. No enlarged abdominal or pelvic lymph nodes. Reproductive: New 3.3 cm low-density structure in the region of the right ovary probably represents ovarian cyst. Normal appearance of the uterus and left adnexa. Other: Negative for free fluid. Negative for free air. Soft tissue changes in the anterior subcutaneous tissues compatible with recent surgery. Musculoskeletal: No acute bone abnormality. IMPRESSION: 1. No acute abnormality in the abdomen or pelvis. 2. Probable small right ovarian cyst or dominant follicle. It is possible that this structure could be related to the patient's symptoms. 3. Postsurgical changes and history of gastric bypass procedure with Roux-en-Y. Patient had recent Roux limb reduction. No evidence for a postsurgical complication or bowel obstruction. Electronically Signed   By: AMarkus DaftM.D.   On: 05/25/2020 10:26    Procedures Procedures (including critical care time)  Medications Ordered in ED Medications  sodium chloride (PF) 0.9 %  injection (has no administration in time range)  prochlorperazine (COMPAZINE) injection 10 mg (10 mg Intravenous Given 05/25/20 0727)  HYDROmorphone (DILAUDID) injection 1 mg (1 mg Intravenous Given 05/25/20 0728)  lactated ringers bolus 1,000 mL (0 mLs Intravenous Stopped 05/25/20 1110)  famotidine (PEPCID) IVPB 20 mg premix (0 mg Intravenous Stopped 05/25/20 1110)  iohexol (OMNIPAQUE) 300 MG/ML solution 100 mL (100 mLs Intravenous Contrast Given 05/25/20 07425    ED Course  I have reviewed the triage vital signs and the nursing notes.  Pertinent labs & imaging results that were available during my care of the patient were reviewed by me and considered in my medical decision making (see chart for details).    MDM Rules/Calculators/A&P                          Patient 34year old female with past medical history detailed above presented today for right lower quadrant abdominal pain.  She is s/p recent revision of  Roux-en-Y gastric surgery this resection was done 11/19 and she has had ongoing worsening abdominal pain since that time.  I reviewed patient encounter 11/25 when she was seen at [facility] emergency room.  Patient forgot that she was seen here and does not recall being told there is anything unusual on her CT scan.  It does appear that after she was discharged an addendum was made by radiology showing a possible leak/fluid collection in her abdomen.  There is an addendum by ER provider showing that they left a voicemail for her with [place] additional information however she states that she never heard this.  On physical exam she has some mild right lower quadrant tenderness to palpation.  She also has some umbilical tenderness.  She is overall very well-appearing.  I have low suspicion for intra-abdominal abscess however given that she has a mildly cytosis likely secondary to vomiting however it is neutrophil predominant and she is having worsening pain and worsening difficulty tolerating p.o.  therefore will obtain CT abdomen pelvis to evaluate for abscess or other acute intra-abdominal pathology.  CT scan shows no acute abnormalities.  There is a small right ovarian cyst however I have low suspicion for this causing her symptoms given the chronicity and timing with her recent surgery.  Likely postsurgical pain.  CBC mild leukocytosis.  CMP without any significant electrolyte derangements very mild elevation alk phos likely postsurgical and mildly low potassium.  Urinalysis without evidence of infection.  I-STAT hCG negative for pregnancy lipase within normal limits doubt pancreatitis.  Given reassuring examination and my reevaluation which was significant for now no tenderness of the abdomen and significantly proved symptoms she is tolerating p.o. and ambulatory.  Will discharge with close follow-up with her surgeon in 6 days.  She also has a primary care doctor appointment later today.  She is given return precautions.  Final Clinical Impression(s) / ED Diagnoses Final diagnoses:  Right lower quadrant abdominal pain    Rx / DC Orders ED Discharge Orders    None       Tedd Sias, [place] 05/25/20 1204    Daleen Bo, MD 05/26/20 2011

## 2020-05-25 NOTE — ED Triage Notes (Signed)
Pt reports RLQ abdominal pain where incision is since "candycane resection" on 11/19.

## 2020-05-28 ENCOUNTER — Encounter: Payer: Self-pay | Admitting: Family Medicine

## 2020-05-28 ENCOUNTER — Ambulatory Visit: Payer: BC Managed Care – PPO | Admitting: Family Medicine

## 2020-05-28 ENCOUNTER — Other Ambulatory Visit: Payer: Self-pay

## 2020-05-28 VITALS — BP 138/98 | HR 94 | Temp 98.5°F | Wt 269.0 lb

## 2020-05-28 DIAGNOSIS — M797 Fibromyalgia: Secondary | ICD-10-CM | POA: Diagnosis not present

## 2020-05-28 DIAGNOSIS — K561 Intussusception: Secondary | ICD-10-CM

## 2020-05-28 DIAGNOSIS — Z9884 Bariatric surgery status: Secondary | ICD-10-CM | POA: Diagnosis not present

## 2020-05-28 DIAGNOSIS — F3181 Bipolar II disorder: Secondary | ICD-10-CM

## 2020-05-28 DIAGNOSIS — Z09 Encounter for follow-up examination after completed treatment for conditions other than malignant neoplasm: Secondary | ICD-10-CM

## 2020-05-28 MED ORDER — ZOLPIDEM TARTRATE 10 MG PO TABS: 10.0000 mg | ORAL_TABLET | Freq: Every evening | ORAL | 0 refills | Status: DC | PRN

## 2020-05-28 NOTE — Progress Notes (Signed)
Subjective  CC:  Chief Complaint  Patient presents with  . Post-op Problem    Intestinal surgery from previous gastric bypass surgery back in 2014, chronic pain at incision and intestines. Unable to get appt with with surgeon for follow up - directed to call PCP    HPI: [name] [name] is a 34 y.o. female who presents to the office today to address the problems listed above in the chief complaint.  [name] presents for persistent right lower abdominal pain.  I reviewed multiple records since November.  In November she was having persistent nausea vomiting right lower abdominal pain.  Work-up revealed intussusception of the small intestine.  She had surgery on 9 November.  Initially her pain resolved however over the last several weeks she has had multiple ER visits due to persistent right lower abdominal pain which she thinks is incisional related.  She reports that she cannot bend over without significant pain.  She feels best if she is lying flat.  She will continue to have intermittent nausea and vomiting.  She has had multiple CT scans that did not reveal an organic cause.  Lab evaluation was mostly unremarkable.  She has used oxycodone for pain which helps prescription and that makes her sleepy.  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  She has fibromyalgia and bipolar 2 disorder.  She has had significant anxiety in the past.  She is not sleeping well in part due to her pain.  She denies fevers, chills, blood in the stool, urinary symptoms   Assessment  1. Fibromyalgia   2. Status post abdominal surgery, follow-up exam   3. Intussusception of small intestine (HCC)   4. Bipolar 2 disorder (HCC)   5. History of Roux-en-Y gastric bypass 2014      Plan   Fibromyalgia: After evaluation, interview and assessment, it seems most likely that this is a fibromyalgia flare in part triggered by her recent intussusception and surgery.  Her surgical scar is well-healed nontender.  Her  abdomen is benign except for mild tenderness in the right lower quadrant.  CT scan lab work is been unremarkable.  Counseling and education given.  She had done better on Cymbalta but this was stopped by her psychiatrist.  She is currently on Lyrica.  She is not sleeping well.  We will start with Ambien to get her some rest.  Counseling on different ways to manage and think about fibromyalgia pain.  Reassured her.  Close follow-up.  Consider doxepin and/or Elavil if needed.  Ambien will be short-term only.  She is aware of this.  Avoid pain medicines at able.  She does have follow-up with surgeon next week.    Follow up: No follow-ups on file.  06/25/2020  No orders of the defined types were placed in this encounter.  Meds ordered this encounter  Medications  . zolpidem (AMBIEN) 10 MG tablet    Sig: Take 1 tablet (10 mg total) by mouth at bedtime as needed for up to 15 days for sleep.    Dispense:  15 tablet    Refill:  0      I reviewed the patients updated PMH, FH, and SocHx.    Patient Active Problem List   Diagnosis Date Noted  . Panic disorder 06/01/2019    Priority: High  . Family history of colon cancer 05/16/2019    Priority: High  . Major depression, recurrent, chronic (HCC) 05/16/2019    Priority: High  . Insomnia due  to other mental disorder 03/13/2019    Priority: High  . Morbid obesity (HCC) 04/23/2017    Priority: High  . Acquired iron deficiency anemia due to decreased absorption 02/22/2015    Priority: High  . 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    Priority: High  . Vitamin B12 deficiency 06/01/2019    Priority: Medium  . Chronic constipation 05/16/2019    Priority: Medium  . Pain in both lower extremities 11/21/2018    Priority: Medium  . Nocturnal leg cramps 10/26/2018    Priority: Medium  . Nephrolithiasis     Priority: Medium  . Binge eating disorder 04/23/2017    Priority: Medium  . Vitamin D deficiency 06/01/2019    Priority: Low   . Reduced libido, due to Lexapro 07/09/2017    Priority: Low  . Abdominal pain 05/03/2020  . Dehydration 05/03/2020  . Nausea & vomiting 05/03/2020  . 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  . Plantar fasciitis 10/31/2019  . Bipolar 2 disorder (HCC) 09/19/2019  . Fibromyalgia 09/09/2019  . Ischial bursitis of left side 07/19/2019   Current Meds  Medication Sig  . acetaminophen (TYLENOL) 500 MG tablet Take 2 tablets (1,000 mg total) by mouth every 6 (six) hours as needed for moderate pain.  Marland Kitchen albuterol (VENTOLIN HFA) 108 (90 Base) MCG/ACT inhaler Inhale 2 puffs into the lungs every 4 (four) hours as needed for wheezing or shortness of breath.   . busPIRone (BUSPAR) 15 MG tablet Take 15 mg by mouth 3 (three) times daily.  . clotrimazole (MYCELEX) 10 MG troche Take 1 tablet (10 mg total) by mouth 5 (five) times daily. (Patient taking differently: Take 10 mg by mouth daily as needed (trush).)  . cyanocobalamin (,VITAMIN B-12,) 1000 MCG/ML injection Inject 1 vial per week for 3 weeks, then 1 vial per month thereafter (Patient taking differently: Inject 100 mcg into the skin once a week. Tuesdays)  . diclofenac Sodium (VOLTAREN) 1 % GEL Apply 4 g topically 4 (four) times daily. (Patient taking differently: Apply 4 g topically 4 (four) times daily as needed (pain).)  . docusate sodium (COLACE) 100 MG capsule Take 1 capsule (100 mg total) by mouth 2 (two) times daily.  Marland Kitchen FIBER ADULT GUMMIES PO Take 4 tablets by mouth daily.   . hydrOXYzine (VISTARIL) 50 MG capsule Take 50 mg by mouth in the morning, at noon, and at bedtime.   . lamoTRIgine (LAMICTAL) 150 MG tablet Take 300 mg by mouth daily.   Marland Kitchen lithium carbonate (LITHOBID) 300 MG CR tablet Take 600 mg by mouth at bedtime.  . Magnesium 250 MG TABS Take 500 mg by mouth at bedtime.   . metFORMIN (GLUCOPHAGE) 500 MG tablet Take 500 mg by mouth 2 (two) times daily.  . Multiple Vitamins-Minerals (MULTIVITAMIN ADULT) CHEW Chew 2 tablets by  mouth daily.  . ondansetron (ZOFRAN ODT) 4 MG disintegrating tablet 4mg  ODT q4 hours prn nausea/vomit (Patient taking differently: Take 4 mg by mouth every hour as needed for nausea or vomiting.)  . ondansetron (ZOFRAN) 4 MG tablet Take 1 tablet (4 mg total) by mouth every 8 (eight) hours as needed for nausea.  . pregabalin (LYRICA) 75 MG capsule Take 1 capsule (75 mg total) by mouth 2 (two) times daily.  . promethazine (PHENERGAN) 25 MG suppository Place 1 suppository (25 mg total) rectally every 6 (six) hours as needed for nausea or vomiting.  . promethazine (PHENERGAN) 25 MG tablet Take 1 tablet (25 mg total) by mouth  every 6 (six) hours as needed for nausea or vomiting.  Marland Kitchen QUEtiapine (SEROQUEL) 400 MG tablet Take 400 mg by mouth at bedtime.   . SYRINGE-NEEDLE, DISP, 3 ML 25G X 1" 3 ML MISC Use 1 needle per application once weekly  . tizanidine (ZANAFLEX) 2 MG capsule Take 2 capsules (4 mg total) by mouth 3 (three) times daily as needed for muscle spasms.  . valACYclovir (VALTREX) 1000 MG tablet Take 1 tablet (1,000 mg total) by mouth 3 (three) times daily. (Patient taking differently: Take 1,000 mg by mouth 3 (three) times daily as needed (cold sores).)  . Vitamin D, Ergocalciferol, (DRISDOL) 1.25 MG (50000 UT) CAPS capsule Take 50,000 Units by mouth once a week. Fridays    Allergies: Patient is allergic to nsaids, other, and zolpidem. Family History: Patient family history includes Anxiety disorder in her father and mother; Bipolar disorder in her father; Cancer in her father; Colon cancer (age of onset: 52) in her father; Depression in her father and mother; Diabetes in her maternal grandfather; Hyperlipidemia in her father; Hypertension in her father, maternal grandmother, and mother; Kidney disease in her father; Obesity in her father and mother; Sleep apnea in her father; Uterine cancer (age of onset: 65) in her maternal grandmother. Social History:  Patient  reports that she has never  smoked. She has never used smokeless tobacco. She reports current alcohol use. She reports that she does not use drugs.  Review of Systems: Constitutional: Negative for fever malaise or anorexia Cardiovascular: negative for chest pain Respiratory: negative for SOB or persistent cough Gastrointestinal: negative for abdominal pain  Objective  Vitals: BP (!) 138/98   Pulse 94   Temp 98.5 F (36.9 C) (Temporal)   Wt 269 lb (122 kg)   LMP 05/03/2020 Comment: Negative HCG blood test 12-10-2021Tubal Ligation as well  SpO2 97%   BMI 46.17 kg/m  General: no acute distress , A&Ox3, appears well and comfortable Psych: Poor insight HEENT: PEERL, conjunctiva normal, neck is supple Cardiovascular:  RRR without murmur or gallop.  Respiratory:  Good breath sounds bilaterally, CTAB with normal respiratory effort Gastrointestinal: soft, flat abdomen, normal active bowel sounds, no palpable masses, well-healed laparoscopic surgical scars, no redness or inflammation, right lower quadrant tenderness present without rebound or guarding or masses palpated.  No hepatosplenomegaly, no appreciated hernias I reviewed Skin:  Warm, no rashes Normal gait    Commons side effects, risks, benefits, and alternatives for medications and treatment plan prescribed today were discussed, and the patient expressed understanding of the given instructions. Patient is instructed to call or message via MyChart if he/she has any questions or concerns regarding our treatment plan. No barriers to understanding were identified. We discussed Red Flag symptoms and signs in detail. Patient expressed understanding regarding what to do in case of urgent or emergency type symptoms.   Medication list was reconciled, printed and provided to the patient in AVS. Patient instructions and summary information was reviewed with the patient as documented in the AVS. This note was prepared with assistance of Dragon voice recognition software.  Occasional wrong-word or sound-a-like substitutions may have occurred due to the inherent limitations of voice recognition software  This visit occurred during the SARS-CoV-2 public health emergency.  Safety protocols were in place, including screening questions prior to the visit, additional usage of staff PPE, and extensive cleaning of exam room while observing appropriate contact time as indicated for disinfecting solutions.

## 2020-05-28 NOTE — Patient Instructions (Signed)
Please follow up as scheduled for your next visit with me: 06/25/2020   I believe your pain response is mostly due to your fibromyalgia pain sensitivities. This is worsened by stress: surgery, pain, poor sleep etc.   I've ordered ambien to help you sleep over the next week.  Consider acupuncture or massage to help with stress reduction. Please discuss with your psychiatrist getting back on cymbalta for your fibromyalgia pain. I may also consider elavil or doxepin in the future.   If you have any questions or concerns, please don't hesitate to send me a message via MyChart or call the office at [phone]. Thank you for visiting with [place] today! It's our pleasure caring for you.

## 2020-05-29 DIAGNOSIS — F1910 Other psychoactive substance abuse, uncomplicated: Secondary | ICD-10-CM | POA: Diagnosis not present

## 2020-05-29 DIAGNOSIS — F3181 Bipolar II disorder: Secondary | ICD-10-CM | POA: Diagnosis not present

## 2020-06-03 DIAGNOSIS — Z20828 Contact with and (suspected) exposure to other viral communicable diseases: Secondary | ICD-10-CM | POA: Diagnosis not present

## 2020-06-03 DIAGNOSIS — Z20822 Contact with and (suspected) exposure to covid-19: Secondary | ICD-10-CM | POA: Diagnosis not present

## 2020-06-04 ENCOUNTER — Emergency Department (HOSPITAL_COMMUNITY)
Admission: EM | Admit: 2020-06-04 | Discharge: 2020-06-05 | Disposition: A | Discharge status: Home / Self Care | Payer: BC Managed Care – PPO | Attending: Emergency Medicine | Admitting: Emergency Medicine

## 2020-06-04 ENCOUNTER — Other Ambulatory Visit: Payer: Self-pay

## 2020-06-04 ENCOUNTER — Encounter (HOSPITAL_COMMUNITY): Payer: Self-pay | Admitting: Emergency Medicine

## 2020-06-04 DIAGNOSIS — Z5321 Procedure and treatment not carried out due to patient leaving prior to being seen by health care provider: Secondary | ICD-10-CM | POA: Diagnosis not present

## 2020-06-04 DIAGNOSIS — U071 COVID-19: Secondary | ICD-10-CM | POA: Diagnosis not present

## 2020-06-04 NOTE — ED Notes (Signed)
Patient reports exposure to her room mate that is positive for Covid19 .

## 2020-06-05 ENCOUNTER — Telehealth: Payer: BC Managed Care – PPO | Admitting: Physician Assistant

## 2020-06-05 NOTE — ED Notes (Signed)
Pt called for roll call x2 with no response.

## 2020-06-05 NOTE — ED Notes (Signed)
Pt called x3 for vital signs with no response. 

## 2020-06-05 NOTE — Progress Notes (Signed)
Patient did not show for virtual appointment.

## 2020-06-06 ENCOUNTER — Other Ambulatory Visit: Payer: Self-pay

## 2020-06-06 ENCOUNTER — Ambulatory Visit (INDEPENDENT_AMBULATORY_CARE_PROVIDER_SITE_OTHER): Payer: BC Managed Care – PPO | Admitting: Physician Assistant

## 2020-06-06 ENCOUNTER — Encounter: Payer: Self-pay | Admitting: Physician Assistant

## 2020-06-06 VITALS — BP 140/90 | HR 105 | Temp 97.9°F | Ht 68.0 in | Wt 258.4 lb

## 2020-06-06 DIAGNOSIS — J069 Acute upper respiratory infection, unspecified: Secondary | ICD-10-CM | POA: Diagnosis not present

## 2020-06-06 MED ORDER — QVAR REDIHALER 40 MCG/ACT IN AERB: 1.0000 | INHALATION_SPRAY | Freq: Two times a day (BID) | RESPIRATORY_TRACT | 4 refills | Status: DC

## 2020-06-06 MED ORDER — HYDROCOD POLST-CPM POLST ER 10-8 MG/5ML PO SUER: 5.0000 mL | Freq: Every evening | ORAL | 0 refills | Status: DC | PRN

## 2020-06-06 MED ORDER — AZITHROMYCIN 250 MG PO TABS: ORAL_TABLET | ORAL | 0 refills | Status: DC

## 2020-06-06 NOTE — Patient Instructions (Signed)
It was great to see you!  You have a viral upper respiratory infection. Antibiotics are not needed for this.  Viral infections usually take 7-10 days to resolve.  The cough can last a few weeks to go away.  Use medication as prescribed:  Albuterol inhaler as needed Flonase in both nostrils twice daily QVAR inhaler in AM and PM -- this was sent in Tussionex cough syrup at night -- this was sent in  I have also sent in a z-pack (antibiotic.) Don't start but if you feel worse or fevers worsen/cough worsens, go ahead and start.  Push fluids and get plenty of rest. Please return if you are not improving as expected, or if you have high fevers (>101.5) or difficulty swallowing or worsening productive cough.  Call clinic with questions.  I hope you start feeling better soon!

## 2020-06-06 NOTE — Progress Notes (Signed)
[name] [name] is a 34 y.o. female here for a new problem.  I acted as a Neurosurgeonscribe for Energy East CorporationSamantha Laurens Matheny, PA-C [name] Mullonna Orphanos, LPN   History of Present Illness:   Chief Complaint  Patient presents with   Cough   Otalgia    HPI   Cough; Otalgia Pt c/o cough, nasal congestion, headaches started on Saturday and fever started on Sunday & Monday 101.1 to 100.4 came down with Tylenol. Using Delsym OTC. Pt had COVID and Flue testing done on Sunday and it was negative. Pt c/o pain left ear since Sunday, no drainage. She is also having diarrhea.  She reports that her daughter is sick with similar symptoms. Her daughter also tested negative for flu/covid/RSV.  Denies: chest pain, SOB, significant wheezing  Past Medical History:  Diagnosis Date   ADD (attention deficit disorder)    Anemia    Anxiety    B12 deficiency    Back pain    Bilateral swelling of feet    Bipolar 2 disorder (HCC) 09/19/2019   Constipation    DEPRESSION    Elevated cholesterol    Family history of colon cancer 05/16/2019   Father, 8650s; deceased.    Fatty liver    Fibromyalgia    Gallbladder problem    GERD (gastroesophageal reflux disease)    Heartburn    History of stomach ulcers    Hypertension    Hypothyroidism    Hx of, normalized TSH during pregnancy 2011   Infertility, female    Lactose intolerance    Migraines    Morbid obesity (HCC)    s/p RY 08/2012 - start weight 290 pounds   Nephrolithiasis    Palpitations    Panic disorder    PCOS (polycystic ovarian syndrome)    PONV (postoperative nausea and vomiting)    Pregnancy induced hypertension    Shortness of breath    Vitamin D deficiency      Social History   Tobacco Use   Smoking status: Never Smoker   Smokeless tobacco: Never Used  Vaping Use   Vaping Use: Never used  Substance Use Topics   Alcohol use: Yes    Comment: rare/socially   Drug use: No    Past Surgical History:  Procedure  Laterality Date   [name] [name] Left 06/24/2017   Procedure: [name] ReichertAIKEN [name];  Surgeon: [name] [name], [name] R, DPM;  Location: [facility];  Service: Podiatry;  Laterality: Left;   [name] Left 06/24/2017   Procedure: [name] [name];  Surgeon: [name] [name], [name] R, DPM;  Location:  SURGERY CENTER;  Service: Podiatry;  Laterality: Left;   CESAREAN SECTION     x 2   CHOLECYSTECTOMY N/A 02/15/2019   Procedure: LAPAROSCOPIC CHOLECYSTECTOMY WITH INTRAOPERATIVE CHOLANGIOGRAM;  Surgeon: Andria MeuseWhite, [name] M, MD;  Location: [facility];  Service: General;  Laterality: N/A;   COLON RESECTION N/A 05/04/2020   Procedure: DIAGNOSTIC LAPAROSCOPY, RESECTION OF CANDYCANE, UPPER ENDOSCOPY;  Surgeon: [name] [name], [name] A, MD;  Location: [facility];  Service: General;  Laterality: N/A;   FOOT SURGERY     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   Procedure: LAPAROSCOPIC ROUX-EN-Y GASTRIC;  Surgeon: Atilano InaEric M Wilson, MD;  Location: [facility];  Service: General;  Laterality: N/A;  laparoscopic roux-en-y gastric bypass   LITHOTRIPSY Left    TONSILLECTOMY     TUBAL LIGATION     UPPER GI ENDOSCOPY  08/24/2012   Procedure: UPPER GI ENDOSCOPY;  Surgeon: Atilano InaEric M Wilson, MD;  Location: [facility];  Service: General;;  WISDOM TOOTH EXTRACTION      Family History  Problem Relation Age of Onset   Hyperlipidemia Father    Hypertension Father    Kidney disease Father    Colon cancer Father 53   Cancer Father    Depression Father    Anxiety disorder Father    Bipolar disorder Father    Sleep apnea Father    Obesity Father    Hypertension Mother    Depression Mother    Anxiety disorder Mother    Obesity Mother    Hypertension Maternal Grandmother    Uterine cancer Maternal Grandmother 85   Diabetes Maternal Grandfather     Allergies  Allergen Reactions   Nsaids Other (See Comments)    Gastric bypass "gastric bypass"   Other Other (See Comments)    "sleep walking"   Zolpidem  Other (See Comments)    Sleep walking  Sleep walking     Current Medications:   Current Outpatient Medications:    acetaminophen (TYLENOL) 500 MG tablet, Take 2 tablets (1,000 mg total) by mouth every 6 (six) hours as needed for moderate pain., Disp: 30 tablet, Rfl: 0   albuterol (VENTOLIN HFA) 108 (90 Base) MCG/ACT inhaler, Inhale 2 puffs into the lungs every 4 (four) hours as needed for wheezing or shortness of breath. , Disp: , Rfl:    busPIRone (BUSPAR) 15 MG tablet, Take 15 mg by mouth 3 (three) times daily., Disp: , Rfl:    clotrimazole (MYCELEX) 10 MG troche, Take 1 tablet (10 mg total) by mouth 5 (five) times daily. (Patient taking differently: Take 10 mg by mouth daily as needed (trush).), Disp: 50 Troche, Rfl: 0   cyanocobalamin (,VITAMIN B-12,) 1000 MCG/ML injection, Inject 1 vial per week for 3 weeks, then 1 vial per month thereafter (Patient taking differently: Inject 100 mcg into the skin once a week. Tuesdays), Disp: 6 mL, Rfl: 3   diclofenac Sodium (VOLTAREN) 1 % GEL, Apply 4 g topically 4 (four) times daily. (Patient taking differently: Apply 4 g topically 4 (four) times daily as needed (pain).), Disp: 100 g, Rfl: 0   docusate sodium (COLACE) 100 MG capsule, Take 1 capsule (100 mg total) by mouth 2 (two) times daily., Disp: 30 capsule, Rfl: 2   FIBER ADULT GUMMIES PO, Take 4 tablets by mouth daily. , Disp: , Rfl:    hydrOXYzine (VISTARIL) 50 MG capsule, Take 50 mg by mouth in the morning, at noon, and at bedtime. , Disp: , Rfl:    lamoTRIgine (LAMICTAL) 150 MG tablet, Take 300 mg by mouth daily. , Disp: , Rfl:    lithium carbonate (LITHOBID) 300 MG CR tablet, Take 600 mg by mouth at bedtime., Disp: , Rfl:    Magnesium 250 MG TABS, Take 500 mg by mouth at bedtime. , Disp: , Rfl:    metFORMIN (GLUCOPHAGE) 500 MG tablet, Take 500 mg by mouth 2 (two) times daily., Disp: , Rfl:    Multiple Vitamins-Minerals (MULTIVITAMIN ADULT) CHEW, Chew 2 tablets by mouth daily.,  Disp: , Rfl:    ondansetron (ZOFRAN ODT) 4 MG disintegrating tablet, 4mg  ODT q4 hours prn nausea/vomit (Patient taking differently: Take 4 mg by mouth every hour as needed for nausea or vomiting.), Disp: 10 tablet, Rfl: 0   ondansetron (ZOFRAN) 4 MG tablet, Take 1 tablet (4 mg total) by mouth every 8 (eight) hours as needed for nausea., Disp: 8 tablet, Rfl: 5   pregabalin (LYRICA) 75 MG capsule, Take 1 capsule (75 mg total)  by mouth 2 (two) times daily., Disp: 180 capsule, Rfl: 3   QUEtiapine (SEROQUEL) 400 MG tablet, Take 400 mg by mouth at bedtime. , Disp: , Rfl:    SYRINGE-NEEDLE, DISP, 3 ML 25G X 1" 3 ML MISC, Use 1 needle per application once weekly, Disp: 12 each, Rfl: 0   tizanidine (ZANAFLEX) 2 MG capsule, Take 2 capsules (4 mg total) by mouth 3 (three) times daily as needed for muscle spasms., Disp: 180 capsule, Rfl: 3   valACYclovir (VALTREX) 1000 MG tablet, Take 1 tablet (1,000 mg total) by mouth 3 (three) times daily. (Patient taking differently: Take 1,000 mg by mouth 3 (three) times daily as needed (cold sores).), Disp: 21 tablet, Rfl: 0   Vitamin D, Ergocalciferol, (DRISDOL) 1.25 MG (50000 UT) CAPS capsule, Take 50,000 Units by mouth once a week. Fridays, Disp: , Rfl:    zolpidem (AMBIEN) 10 MG tablet, Take 1 tablet (10 mg total) by mouth at bedtime as needed for up to 15 days for sleep., Disp: 15 tablet, Rfl: 0   azithromycin (ZITHROMAX Z-PAK) 250 MG tablet, Take 2 tablets ( total of 500 mg) PO on day 1, then 1 tablet ( total of 250 mg) PO q24 x 4 days., Disp: 6 each, Rfl: 0   beclomethasone (QVAR REDIHALER) 40 MCG/ACT inhaler, Inhale 1 puff into the lungs 2 (two) times daily., Disp: 1 each, Rfl: 4   chlorpheniramine-HYDROcodone (TUSSIONEX PENNKINETIC ER) 10-8 MG/5ML SUER, Take 5 mLs by mouth at bedtime as needed for cough., Disp: 60 mL, Rfl: 0   Review of Systems:   ROS  Negative unless otherwise specified per HPI.  Vitals:   Vitals:   06/06/20 1610  BP: 140/90   Pulse: (!) 105  Temp: 97.9 F (36.6 C)  TempSrc: Temporal  SpO2: 97%  Weight: 258 lb 6.1 oz (117.2 kg)  Height: 5\' 8"  (1.727 m)     Body mass index is 39.29 kg/m.  Physical Exam:   Physical Exam Vitals and nursing note reviewed.  Constitutional:      General: She is not in acute distress.    Appearance: She is well-developed. She is not ill-appearing, toxic-appearing or sickly-appearing.  HENT:     Head: Normocephalic and atraumatic.     Right Ear: Tympanic membrane, ear canal and external ear normal. Tympanic membrane is not erythematous, retracted or bulging.     Left Ear: Tympanic membrane, ear canal and external ear normal. Tympanic membrane is not erythematous, retracted or bulging.     Nose: Nose normal.     Right Sinus: No maxillary sinus tenderness or frontal sinus tenderness.     Left Sinus: No maxillary sinus tenderness or frontal sinus tenderness.     Mouth/Throat:     Pharynx: Uvula midline. No posterior oropharyngeal edema or posterior oropharyngeal erythema.  Eyes:     General: Lids are normal.     Conjunctiva/sclera: Conjunctivae normal.  Neck:     Trachea: Trachea normal.  Cardiovascular:     Rate and Rhythm: Normal rate and regular rhythm.     Heart sounds: Normal heart sounds, S1 normal and S2 normal.  Pulmonary:     Effort: Pulmonary effort is normal.     Breath sounds: Normal breath sounds. No decreased breath sounds, wheezing, rhonchi or rales.  Lymphadenopathy:     Cervical: No cervical adenopathy.  Skin:    General: Skin is warm, dry and intact.  Neurological:     Mental Status: She is alert.  Psychiatric:  Mood and Affect: Mood and affect normal.        Speech: Speech normal.        Behavior: Behavior normal. Behavior is cooperative.      Assessment and Plan:   Anushri was seen today for cough and otalgia.  Diagnoses and all orders for this visit:  Viral URI with cough No red flags on discussion, patient is not in any obvious  distress during our visit. Discussed progression of most viral illness, and recommended supportive care at this point in time. Continue prn albuterol. Start QVAR BID (or other ICS if this is unaffordable). Flonase BID in both nostrils.  I did however provide pocket rx for oral azithromycin should symptoms not improve as anticipated. Discussed over the counter supportive care options, with recommendations to push fluids and rest. Reviewed return precautions including new/worsening fever, SOB, new/worsening cough or other concerns.  Recommended need to self-quarantine and practice social distancing until symptoms resolve.  I recommend that patient follow-up if symptoms worsen or persist despite treatment x 7-10 days, sooner if needed.  Other orders -     azithromycin (ZITHROMAX Z-PAK) 250 MG tablet; Take 2 tablets ( total of 500 mg) PO on day 1, then 1 tablet ( total of 250 mg) PO q24 x 4 days. -     beclomethasone (QVAR REDIHALER) 40 MCG/ACT inhaler; Inhale 1 puff into the lungs 2 (two) times daily. -     Discontinue: chlorpheniramine-HYDROcodone (TUSSIONEX PENNKINETIC ER) 10-8 MG/5ML SUER; Take 5 mLs by mouth at bedtime as needed for cough. -     chlorpheniramine-HYDROcodone (TUSSIONEX PENNKINETIC ER) 10-8 MG/5ML SUER; Take 5 mLs by mouth at bedtime as needed for cough.   CMA or LPN served as scribe during this visit. History, Physical, and Plan performed by medical provider. The above documentation has been reviewed and is accurate and complete.   Jarold Motto, PA-C

## 2020-06-16 ENCOUNTER — Encounter: Payer: Self-pay | Admitting: Family Medicine

## 2020-06-19 ENCOUNTER — Encounter: Payer: Self-pay | Admitting: Family Medicine

## 2020-06-19 ENCOUNTER — Other Ambulatory Visit: Payer: Self-pay

## 2020-06-19 ENCOUNTER — Ambulatory Visit: Payer: BC Managed Care – PPO | Admitting: Family Medicine

## 2020-06-19 VITALS — BP 130/88 | HR 112 | Ht 68.0 in | Wt 256.4 lb

## 2020-06-19 DIAGNOSIS — E538 Deficiency of other specified B group vitamins: Secondary | ICD-10-CM

## 2020-06-19 DIAGNOSIS — G4762 Sleep related leg cramps: Secondary | ICD-10-CM

## 2020-06-19 DIAGNOSIS — R251 Tremor, unspecified: Secondary | ICD-10-CM

## 2020-06-19 DIAGNOSIS — M6281 Muscle weakness (generalized): Secondary | ICD-10-CM | POA: Diagnosis not present

## 2020-06-19 DIAGNOSIS — R202 Paresthesia of skin: Secondary | ICD-10-CM | POA: Diagnosis not present

## 2020-06-19 DIAGNOSIS — Z5181 Encounter for therapeutic drug level monitoring: Secondary | ICD-10-CM

## 2020-06-19 DIAGNOSIS — R7989 Other specified abnormal findings of blood chemistry: Secondary | ICD-10-CM

## 2020-06-19 LAB — COMPREHENSIVE METABOLIC PANEL
ALT: 8 U/L (ref 0–35)
AST: 11 U/L (ref 0–37)
Albumin: 4.4 g/dL (ref 3.5–5.2)
Alkaline Phosphatase: 143 U/L — ABNORMAL HIGH (ref 39–117)
BUN: 10 mg/dL (ref 6–23)
CO2: 28 mEq/L (ref 19–32)
Calcium: 9.2 mg/dL (ref 8.4–10.5)
Chloride: 103 mEq/L (ref 96–112)
Creatinine, Ser: 0.68 mg/dL (ref 0.40–1.20)
GFR: 112.36 mL/min (ref >60.00)
Glucose, Bld: 82 mg/dL (ref 70–99)
Potassium: 3.2 mEq/L — ABNORMAL LOW (ref 3.5–5.1)
Sodium: 140 mEq/L (ref 135–145)
Total Bilirubin: 0.3 mg/dL (ref 0.2–1.2)
Total Protein: 7.2 g/dL (ref 6.0–8.3)

## 2020-06-19 LAB — VITAMIN B12: Vitamin B-12: 247 pg/mL (ref 211–911)

## 2020-06-19 MED ORDER — LORAZEPAM 0.5 MG PO TABS: ORAL_TABLET | ORAL | 0 refills | Status: DC

## 2020-06-19 NOTE — Progress Notes (Signed)
I, [name] [name], LAT, ATC, am serving as scribe for Dr. Lynne Leader.  [name] [name] is a 35 y.o. female who presents to [place] at [facility] today for f/u of L leg pain and weakness and LBP.  She was last seen by Dr. Georgina Snell on 02/29/20 for f/u of fibromyalgia and was seen before that on 01/18/20 for her low back and L hip.  She takes Tizanidine regularly for her LBP.  Since her last visit, pt reports increased issue w/ paresthesias in her L leg and now into her L UE as well.  She notes weakness in her L leg and stiffness/pain in her lower back.  She con't to take Tizanidine and Lyrica for her LBP and L leg symptoms. Today, pt reports pain is worse and hurts all the up the spine, and not just in the L-spine. Pt reports falling this morning on ice landing on her knee. Bilat UE and LE numbness/tinling noted.  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  She had subsequent surgery and is feeling a lot better with much less abdominal pain.  However during her evaluation she has had significant electrolyte derangement with very low calcium and potassium at times.  Additionally previously she has had low vitamin B12 in the past.  Additionally she is currently taking lithium which has not been checked in a few months and has not been checked since she is had the electrolyte derangements related to her abdominal pain.  Patient notes diffuse paresthesias and pain.  She notes weakness occasionally.  She is very concerned about multiple sclerosis.  Diagnostic testing: CT abdomen/pelvis w/ contrast - 05/25/20; L-spine MRI- 07/16/19  Pertinent review of systems: No fevers or chills  Relevant historical information: Recent intussusception.  Bipolar disorder.  History of Roux-en-Y gastric bypass.  Currently taking lithium.   Exam:  BP 130/88 (BP Location: Right Arm, Patient Position: Sitting,  Cuff Size: Normal)   Pulse (!) 112   Ht _0  (1.727 m)   Wt 256 lb 6.4 oz (116.3 kg)   SpO2 99%   BMI 38.99 kg/m  General: Well Developed, well nourished, and in no acute distress.   MSK: Upper and lower extremity strength is intact as are reflexes.  Normal coordination.    Lab and Radiology Results  Images of lumbar and portion of thoracic spine visible during CT scan abdomen and pelvis obtained on December 10 personally and independently interpreted today.  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  EXAM: MRI LUMBAR SPINE WITHOUT CONTRAST  TECHNIQUE: Multiplanar, multisequence MR imaging of the lumbar spine was performed. No intravenous contrast was administered.  COMPARISON:  CT 01/28/2019  FINDINGS: Segmentation:  5 lumbar type vertebral bodies.  Alignment:  Normal  Vertebrae:  Normal  Conus medullaris and cauda equina: Conus extends to the L1 level. Conus and cauda equina appear normal.  Paraspinal and other soft tissues: Normal  Disc levels:  All disc levels are normal. No disc degeneration, bulge or herniation. There is very minimal facet prominence at L4-5 and L5-S1. No encroachment upon the neural spaces. This could possibly be a cause of low back discomfort.  IMPRESSION: No disc pathology.  No canal or foraminal stenosis.  Very mild facet hypertrophy at L4-5 and L5-S1. Normally, I would expect this to be subclinical or  asymptomatic. However, there is a possibility that it could be associated with low back   Electronically Signed   By: Nelson Chimes M.D.   On: 07/16/2019 13:11 I, Lynne Leader, personally (independently) visualized and performed the interpretation of the images attached in this note.     Assessment and Plan: 35 y.o. female with paresthesias weakness tremor pain in both upper and lower extremities worse on the left.  Patient has had quite a  bit of evaluation over the past year or longer without a clear explanation of her symptoms.  Differential includes electrolyte derangement, problem with lithium level, peripheral neuropathy, cord compression, or multiple sclerosis.  She has had evaluation of her lumbar spine with MRI about a year ago that was effectively normal.  At this point I think is worthwhile to evaluate for MS.  Will obtain MRI of brain C-spine and T-spine.  This should very well evaluate and rule out MS.  Additionally will evaluate for cord compression or other spinal cord etiology to explain her paresthesias and other neurologic complaints.  Additionally we will proceed with metabolic work-up to evaluate for the recent hypocalcemia that she has had in the past.  Will check metabolic panel and ionized calcium and PTH.  We will also check a lithium level.  Additionally we will recheck B12.  This has been low in the past and she has a gastric bypass history which puts her at increased risk for low B12 which could contribute to some of her neurologic symptoms  Recheck following MRIs.   If normal would consider nerve conduction study.   PDMP not reviewed this encounter. Orders Placed This Encounter  Procedures  . MR BRAIN WO CONTRAST    Standing Status:   Future    Standing Expiration Date:   06/19/2021    Order Specific Question:   What is the patient's sedation requirement?    Answer:   Anti-anxiety    Order Specific Question:   Does the patient have a pacemaker or implanted devices?    Answer:   No    Order Specific Question:   Preferred imaging location?    Answer:   Product/process development scientist (table limit-350lbs)  . MR CERVICAL SPINE WO CONTRAST    Standing Status:   Future    Standing Expiration Date:   06/19/2021    Order Specific Question:   What is the patient's sedation requirement?    Answer:   Anti-anxiety    Order Specific Question:   Does the patient have a pacemaker or implanted devices?    Answer:   No     Order Specific Question:   Preferred imaging location?    Answer:   Product/process development scientist (table limit-350lbs)  . MR THORACIC SPINE WO CONTRAST    Standing Status:   Future    Standing Expiration Date:   06/19/2021    Order Specific Question:   What is the patient's sedation requirement?    Answer:   Anti-anxiety    Order Specific Question:   Does the patient have a pacemaker or implanted devices?    Answer:   No    Order Specific Question:   Preferred imaging location?    Answer:   Product/process development scientist (table limit-350lbs)  . Lithium level    Standing Status:   Future    Number of Occurrences:   1    Standing Expiration Date:   06/19/2021  . Vitamin B12    Standing Status:   Future  Number of Occurrences:   1    Standing Expiration Date:   06/19/2021  . PTH, Intact and Calcium    Standing Status:   Future    Number of Occurrences:   1    Standing Expiration Date:   06/19/2021  . Comp Met (CMET)    Standing Status:   Future    Number of Occurrences:   1    Standing Expiration Date:   06/19/2021   Meds ordered this encounter  Medications  . LORazepam (ATIVAN) 0.5 MG tablet    Sig: 1-2 tabs 30 - 60 min prior to MRI. Do not drive with this medicine.    Dispense:  4 tablet    Refill:  0     Discussed warning signs or symptoms. Please see discharge instructions. Patient expresses understanding.   The above documentation has been reviewed and is accurate and complete Lynne Leader, M.D.

## 2020-06-19 NOTE — Patient Instructions (Signed)
Thank you for coming in today.  You should hear from MRI scheduling within 1 week. If you do not hear please let me know.   Please get labs today before you leave  Recheck after MRI.

## 2020-06-19 NOTE — Progress Notes (Signed)
Vitamin B12 is low again.  I think you probably need to be on ongoing injectable B12.  However I will discuss with your primary care provider. Metabolic panel shows thankfully more normal calcium however other more accurate labs are still pending.  Potassium remains a bit low at 3.2. Other labs are still pending.

## 2020-06-20 ENCOUNTER — Ambulatory Visit: Payer: BC Managed Care – PPO | Admitting: Family Medicine

## 2020-06-20 ENCOUNTER — Other Ambulatory Visit: Payer: Self-pay | Admitting: Family Medicine

## 2020-06-20 LAB — PTH, INTACT AND CALCIUM
Calcium: 9.9 mg/dL (ref 8.6–10.2)
PTH: 71 pg/mL — ABNORMAL HIGH (ref 14–64)

## 2020-06-20 LAB — LITHIUM LEVEL: Lithium Lvl: 0.8 mmol/L (ref 0.6–1.2)

## 2020-06-20 NOTE — Progress Notes (Signed)
Lithium level is normal.  Calcium level is normal.  Parathyroid hormone is very slightly elevated.  Recommend rechecking this in a few months.  I do not think you have a problem with parathyroid hormone.

## 2020-06-21 ENCOUNTER — Other Ambulatory Visit: Payer: Self-pay | Admitting: Family Medicine

## 2020-06-21 ENCOUNTER — Encounter: Payer: Self-pay | Admitting: Family Medicine

## 2020-06-21 ENCOUNTER — Telehealth: Payer: Self-pay

## 2020-06-21 ENCOUNTER — Other Ambulatory Visit: Payer: Self-pay

## 2020-06-21 DIAGNOSIS — G4762 Sleep related leg cramps: Secondary | ICD-10-CM

## 2020-06-21 MED ORDER — "SYRINGE/NEEDLE (DISP) 25G X 1"" 3 ML MISC": 0 refills | Status: DC

## 2020-06-21 MED ORDER — CYANOCOBALAMIN 1000 MCG/ML IJ SOLN: INTRAMUSCULAR | 3 refills | Status: DC

## 2020-06-21 NOTE — Telephone Encounter (Signed)
Refill sent to pharmacy, Ambien request sent to PCP

## 2020-06-21 NOTE — Telephone Encounter (Signed)
Patient would like an approval of short does of ambien ( refill requested sent by patient ).  Patient would also like B12 injections would like RX to be called in to [id number] [place], [place] - [address] as well as syringes.

## 2020-06-21 NOTE — Telephone Encounter (Signed)
Last refill: 05/28/20 #15, 0 Last OV: 05/28/20 dx. Fibromyalgia

## 2020-06-22 MED ORDER — ZOLPIDEM TARTRATE 10 MG PO TABS: 10.0000 mg | ORAL_TABLET | Freq: Every evening | ORAL | 0 refills | Status: DC | PRN

## 2020-06-22 NOTE — Telephone Encounter (Signed)
LAST APPOINTMENT DATE: 06/06/2020  NEXT APPOINTMENT DATE: Visit date not found  Rx Ambien   LAST REFILL: 05/18/2020  QTY: 15

## 2020-06-24 ENCOUNTER — Ambulatory Visit (INDEPENDENT_AMBULATORY_CARE_PROVIDER_SITE_OTHER): Payer: BC Managed Care – PPO

## 2020-06-24 ENCOUNTER — Encounter: Payer: Self-pay | Admitting: Family Medicine

## 2020-06-24 ENCOUNTER — Other Ambulatory Visit: Payer: Self-pay

## 2020-06-24 DIAGNOSIS — Z5181 Encounter for therapeutic drug level monitoring: Secondary | ICD-10-CM

## 2020-06-24 DIAGNOSIS — D1809 Hemangioma of other sites: Secondary | ICD-10-CM | POA: Diagnosis not present

## 2020-06-24 DIAGNOSIS — R202 Paresthesia of skin: Secondary | ICD-10-CM

## 2020-06-24 DIAGNOSIS — M5124 Other intervertebral disc displacement, thoracic region: Secondary | ICD-10-CM

## 2020-06-24 DIAGNOSIS — M6281 Muscle weakness (generalized): Secondary | ICD-10-CM

## 2020-06-24 DIAGNOSIS — G4762 Sleep related leg cramps: Secondary | ICD-10-CM

## 2020-06-24 DIAGNOSIS — R7989 Other specified abnormal findings of blood chemistry: Secondary | ICD-10-CM

## 2020-06-24 DIAGNOSIS — R410 Disorientation, unspecified: Secondary | ICD-10-CM | POA: Diagnosis not present

## 2020-06-24 DIAGNOSIS — E538 Deficiency of other specified B group vitamins: Secondary | ICD-10-CM

## 2020-06-24 DIAGNOSIS — M549 Dorsalgia, unspecified: Secondary | ICD-10-CM | POA: Diagnosis not present

## 2020-06-24 IMAGING — MR MR THORACIC SPINE W/O CM
6 series · 42 of 48 positions shown · non-contrast
Comparison: None available.

CLINICAL DATA: Initial evaluation for paresthesias, muscle
weakness, back pain radiating into the upper and lower extremities.

EXAM:
MRI THORACIC SPINE WITHOUT CONTRAST
TECHNIQUE: Multiplanar, multisequence MR imaging of the thoracic spine was
performed. No intravenous contrast was administered.

[Series 8: T1 · sagittal · 7.0mm · 1.41mm/px · 2 of 7 slices shown (1 of 2)]
[im 1/7]
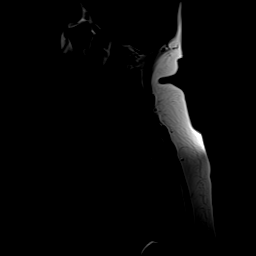
[im 7/7]
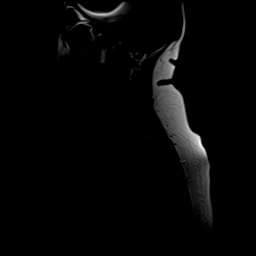

[Series 9: T2 · sagittal · 3.0mm · 1.00mm/px · 6 of 15 slices shown (1 of 2)]
[im 1/15]
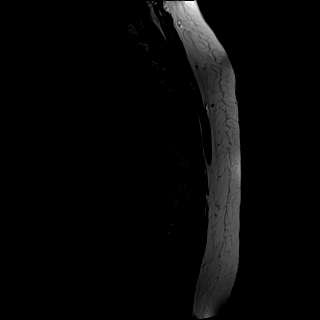
[im 3/15]
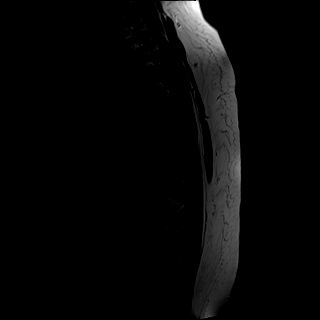
[im 6/15]
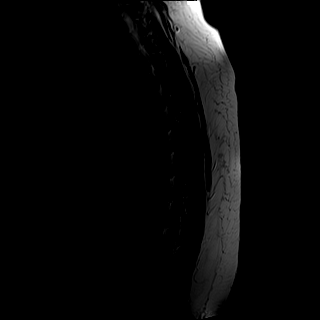
[im 9/15]
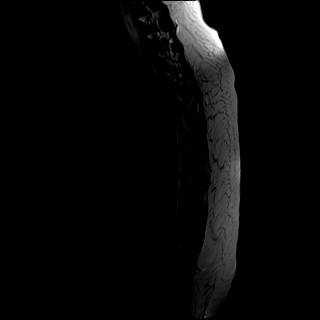
[im 12/15]
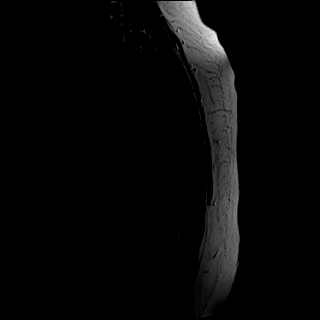
[im 15/15]
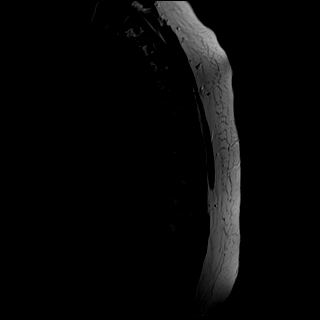

[Series 12: T2 · axial · 5.0mm · 0.86mm/px · z∈[-385,-180]mm · 14 of 39 slices shown (2 of 2)]
[im 1/39]
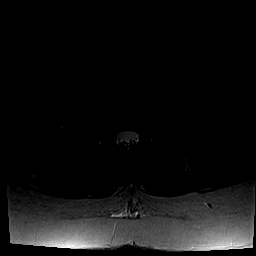
[im 3/39]
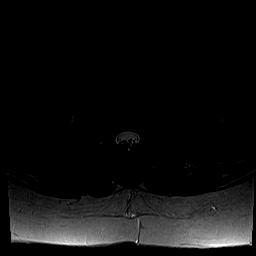
[im 6/39]
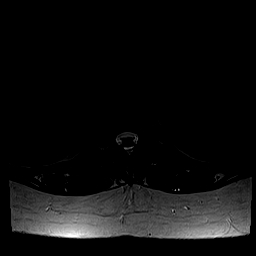
[im 9/39]
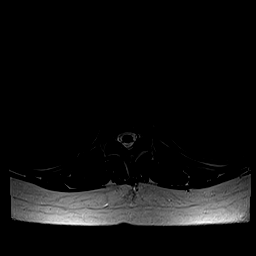
[im 12/39]
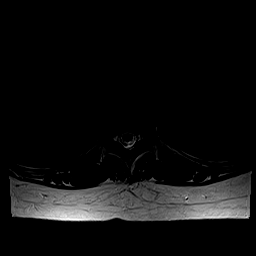
[im 15/39]
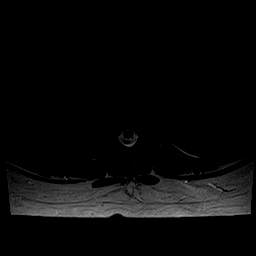
[im 18/39]
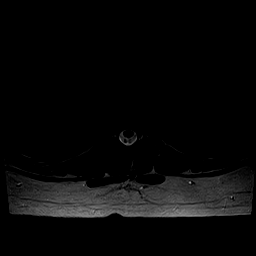
[im 21/39]
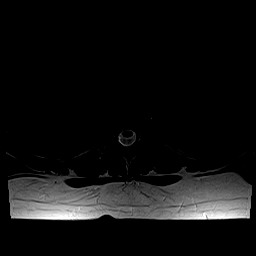
[im 24/39]
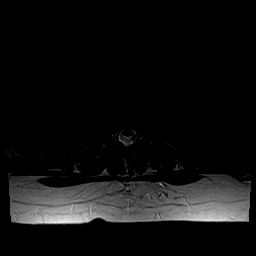
[im 27/39]
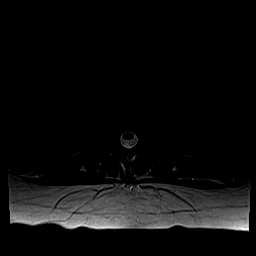
[im 30/39]
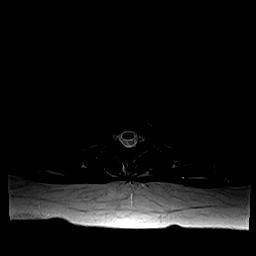
[im 33/39]
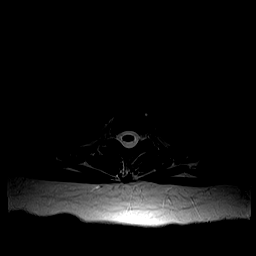
[im 36/39]
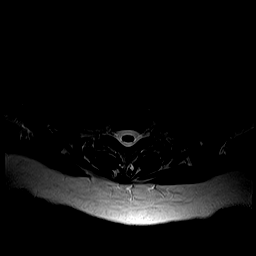
[im 39/39]
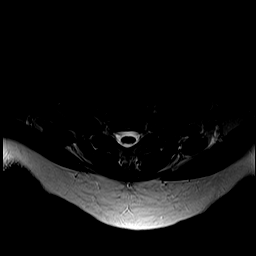

[Series 15: mpgr [id number]_11 · axial · 5.0mm · 0.35mm/px · z∈[-400,-166]mm · 8 of 39 slices shown]
[im 1/39]
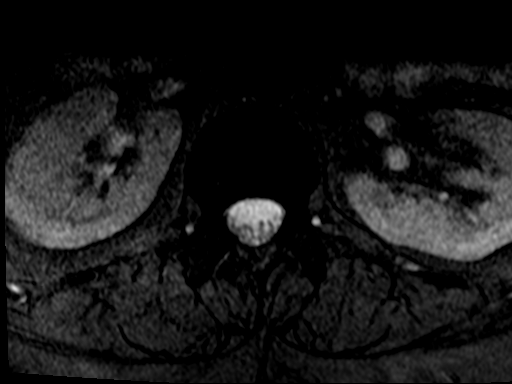
[im 6/39]
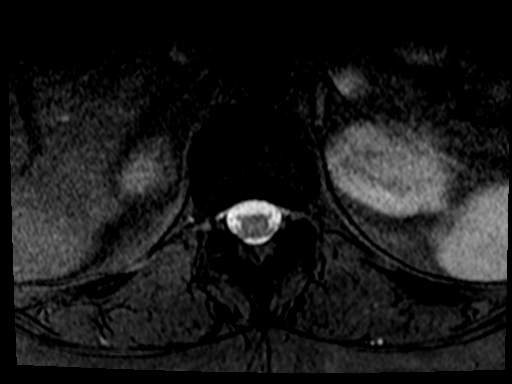
[im 12/39]
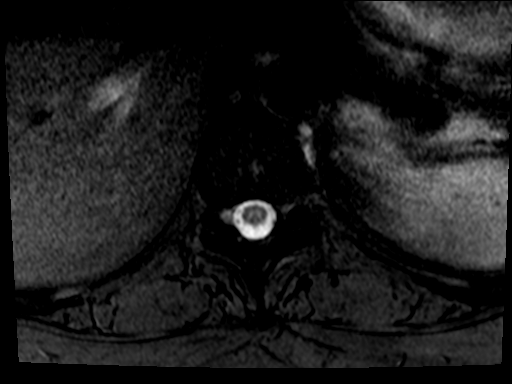
[im 18/39]
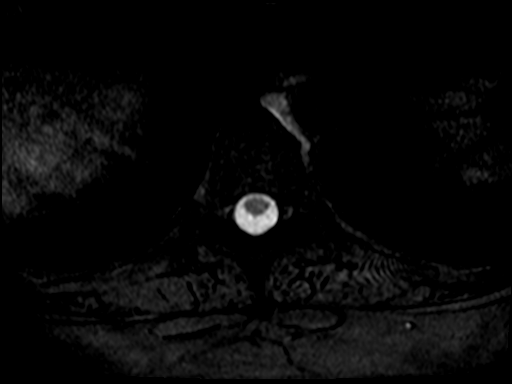
[im 21/39]
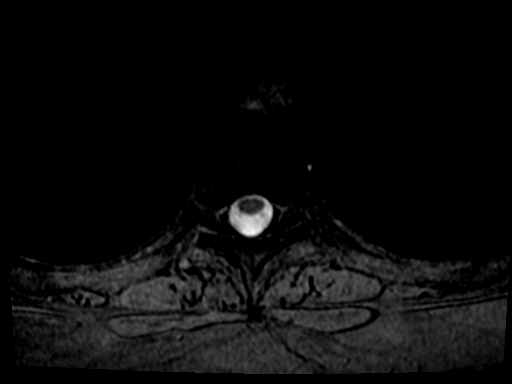
[im 27/39]
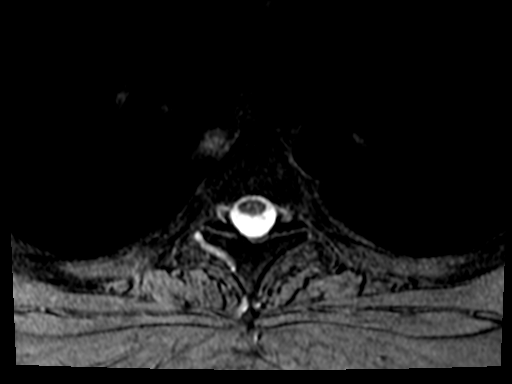
[im 33/39]
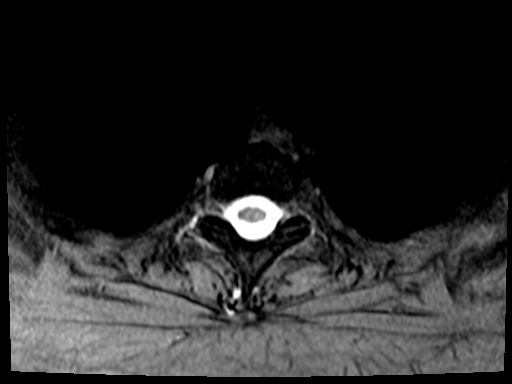
[im 39/39]
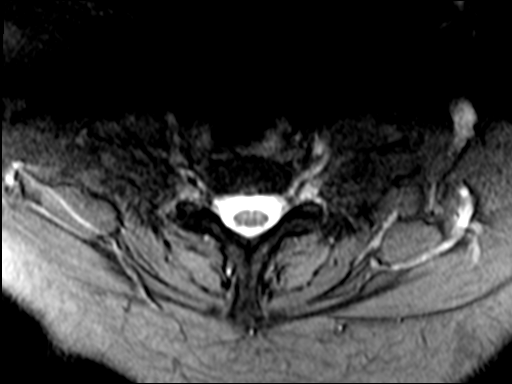

[Series 16: T1 · sagittal · 3.0mm · 1.00mm/px · 6 of 15 slices shown (2 of 2)]
[im 1/15]
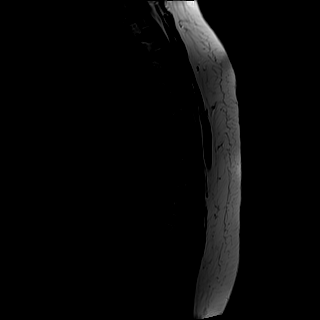
[im 3/15]
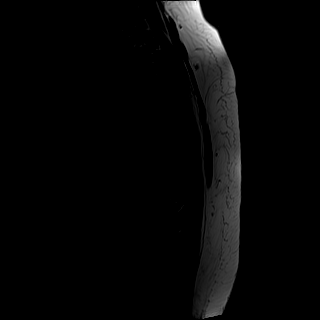
[im 6/15]
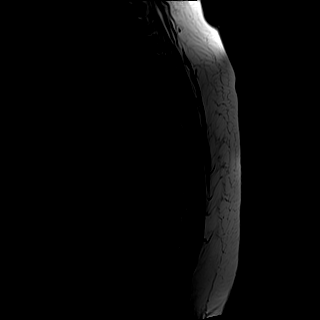
[im 9/15]
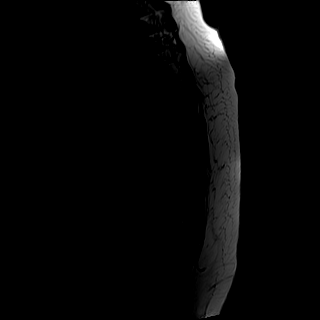
[im 12/15]
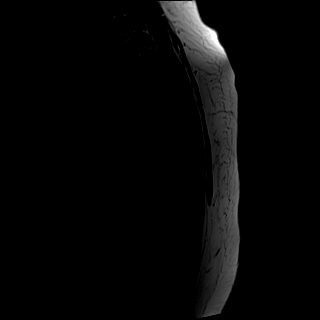
[im 15/15]
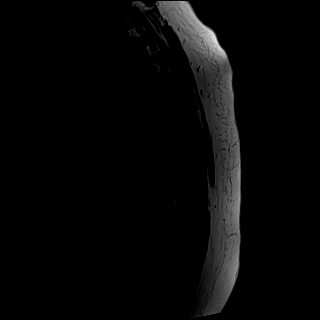

[Series 17: STIR · sagittal · 3.0mm · 1.00mm/px · 6 of 15 slices shown]
[im 1/15]
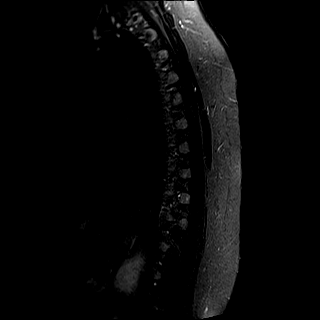
[im 3/15]
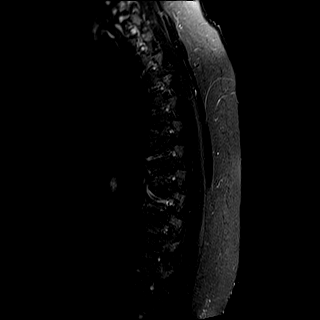
[im 6/15]
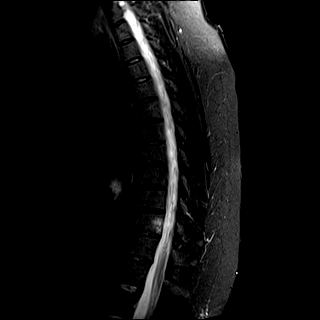
[im 9/15]
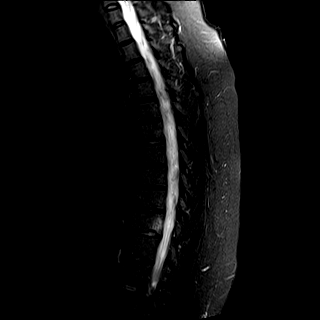
[im 12/15]
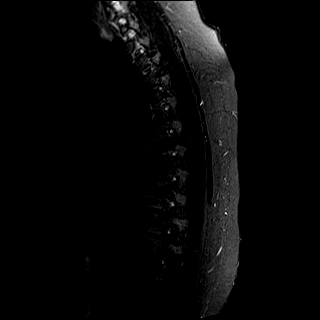
[im 15/15]
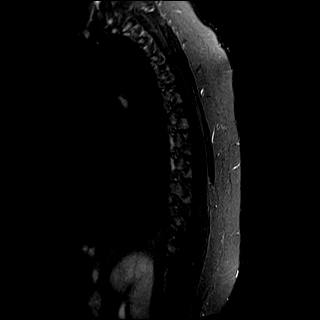

[42 of 48 positions shown; findings below may reference images not displayed]

FINDINGS: Alignment: Physiologic with preservation of the normal thoracic
kyphosis. No listhesis.

Vertebrae: Vertebral body height maintained without acute or chronic
fracture. Scattered chronic endplate Schmorl's node deformities
noted throughout the mid and lower thoracic spine. Bone marrow
signal intensity within normal limits. 1.8 cm hemangioma noted at
the posterior aspect of the T10 vertebral body. Additional probable
small subcentimeter hemangioma at the T3 vertebral body. No other
discrete or worrisome osseous lesions. No abnormal marrow edema.

Cord: Normal signal and morphology. No cord signal changes to
suggest demyelinating disease or other abnormality. Conus medullaris
terminates at approximately the T12-L1 level.

Paraspinal and other soft tissues: Unremarkable.

Disc levels:

T5-6: Tiny central disc protrusion minimally indents the ventral
thecal sac (series 12, image 17). No significant spinal stenosis or
cord deformity.

Otherwise, no significant disc pathology seen within the thoracic
spine. No canal or foraminal stenosis. No neural impingement.
IMPRESSION: 1. Normal MRI of the thoracic spine and spinal cord. No evidence for
demyelinating disease or other abnormality.
2. Tiny central disc protrusion at T5-6 without stenosis or
impingement.

## 2020-06-24 IMAGING — MR MR HEAD W/O CM
11 series · 48 of 48 positions shown · non-contrast
Comparison: Prior CT from [DATE].

CLINICAL DATA: Initial evaluation for paresthesias, muscle
weakness, brain fog, back pain with radiation into the extremities.
Question demyelinating disease. Possible previous head injury.

EXAM:
MRI HEAD WITHOUT CONTRAST
TECHNIQUE: Multiplanar, multiecho pulse sequences of the brain and surrounding
structures were obtained without intravenous contrast.

[Series 3: T1 · sagittal · 5.0mm · 0.45mm/px · 1 of 25 slices shown (1 of 2)]
[im 1/25]
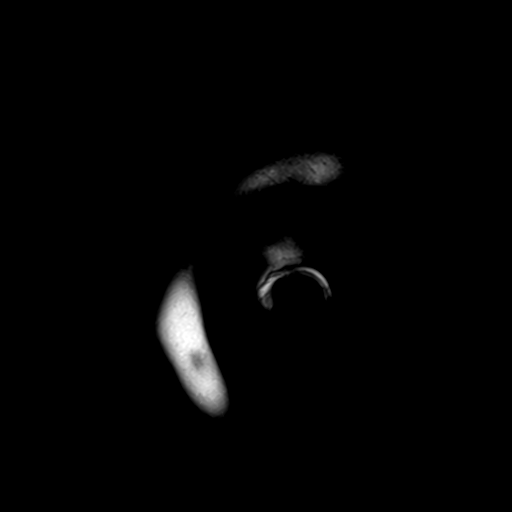

[Series 4: DWI · axial · 3.0mm · 1.20mm/px · z∈[-51,+111]mm · 7 of 109 slices shown (1 of 4)]
[im 1/109]
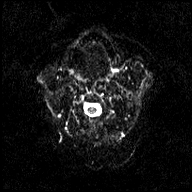
[im 19/109]
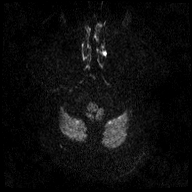
[im 37/109]
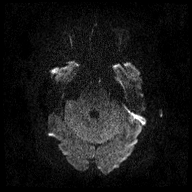
[im 55/109]
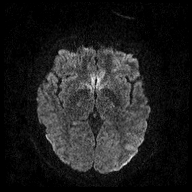
[im 73/109]
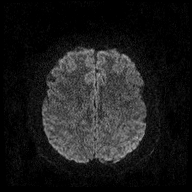
[im 91/109]
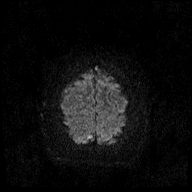
[im 109/109]
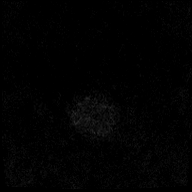

[Series 5: DWI · axial · 3.0mm · 1.20mm/px · z∈[-51,+111]mm · 4 of 55 slices shown (2 of 4)]
[im 1/55]
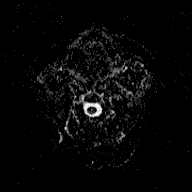
[im 19/55]
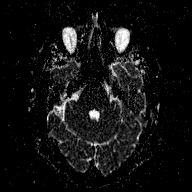
[im 37/55]
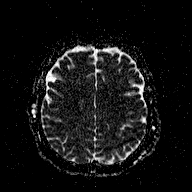
[im 55/55]
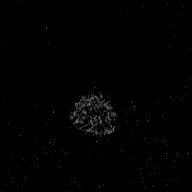

[Series 6: DWI · coronal · 3.0mm · 1.15mm/px · 6 of 88 slices shown (3 of 4)]
[im 1/88]
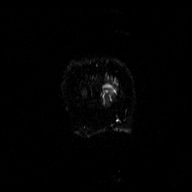
[im 18/88]
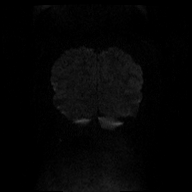
[im 35/88]
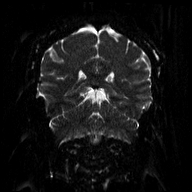
[im 53/88]
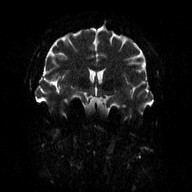
[im 70/88]
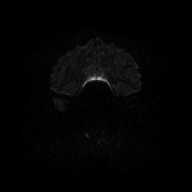
[im 88/88]
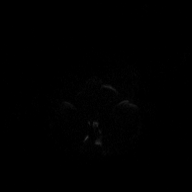

[Series 7: DWI · coronal · 3.0mm · 1.15mm/px · 3 of 45 slices shown (4 of 4)]
[im 1/45]
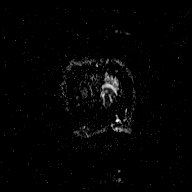
[im 23/45]
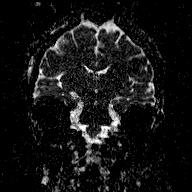
[im 45/45]
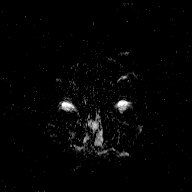

[Series 8: T2 · axial · 3.0mm · 0.72mm/px · z∈[-51,+111]mm · 4 of 55 slices shown (1 of 3)]
[im 1/55]
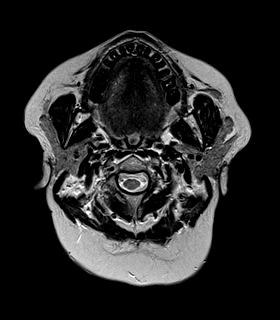
[im 19/55]
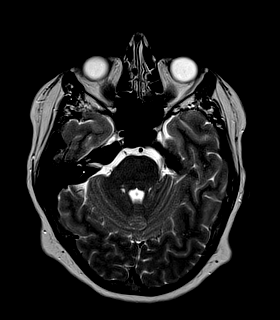
[im 37/55]
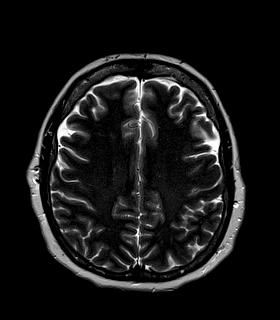
[im 55/55]
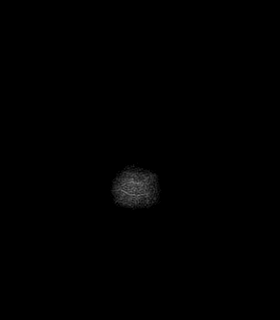

[Series 9: FLAIR · sagittal · 4.0mm · 0.45mm/px · 2 of 32 slices shown (1 of 2)]
[im 1/32]
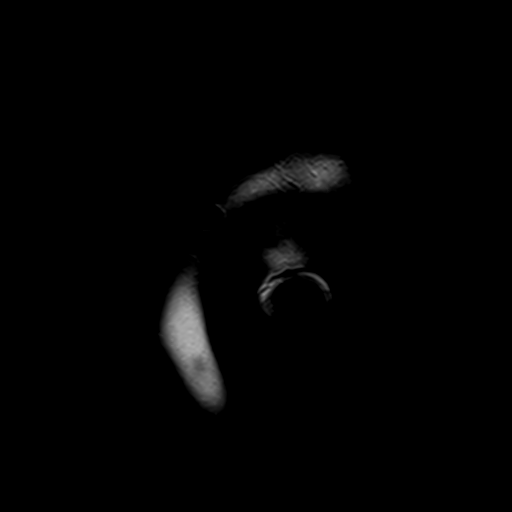
[im 32/32]
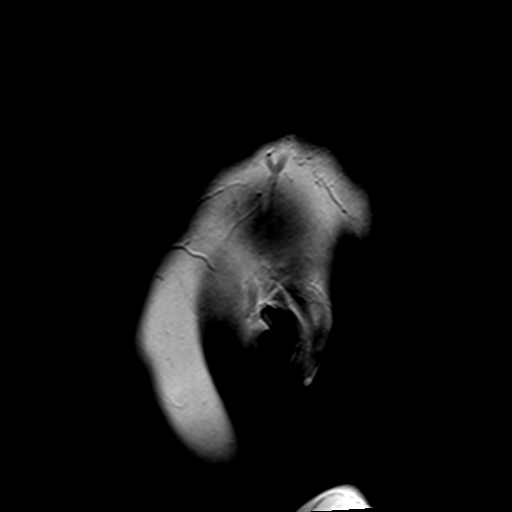

[Series 10: FLAIR · axial · 3.0mm · 0.45mm/px · z∈[-51,+111]mm · 4 of 55 slices shown (2 of 2)]
[im 1/55]
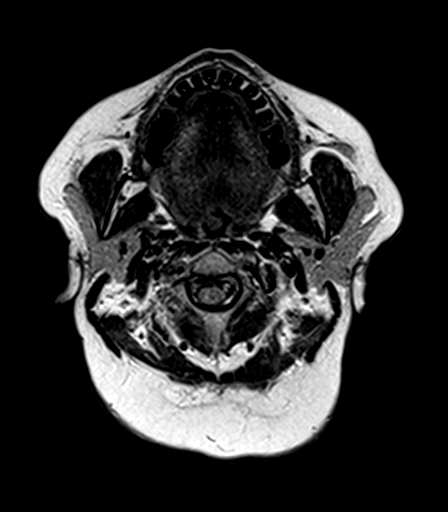
[im 19/55]
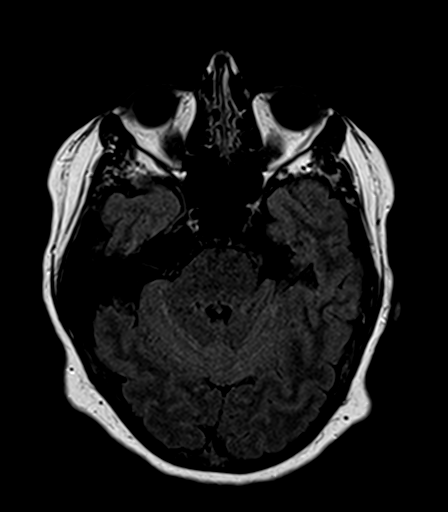
[im 37/55]
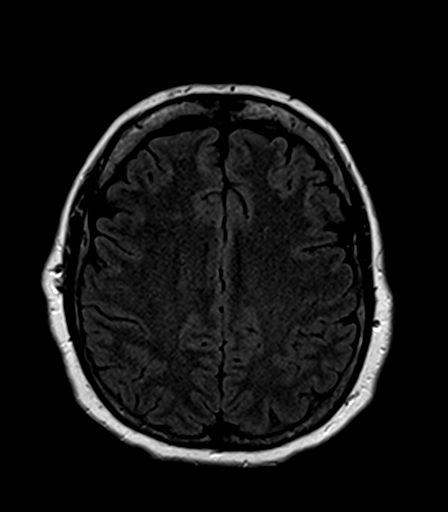
[im 55/55]
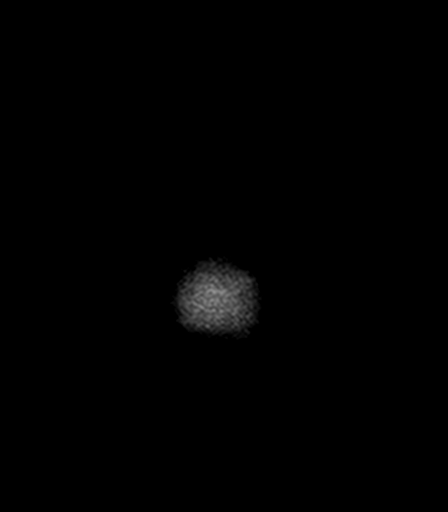

[Series 11: T2 · axial · 3.0mm · 0.72mm/px · z∈[-51,+111]mm · 4 of 55 slices shown (2 of 3)]
[im 1/55]
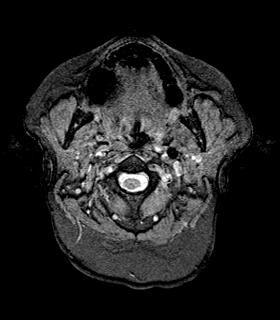
[im 19/55]
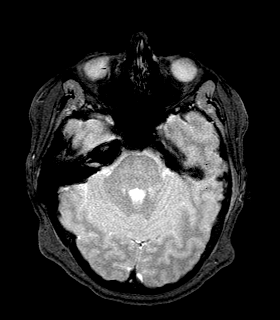
[im 37/55]
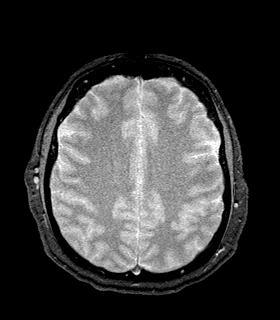
[im 55/55]
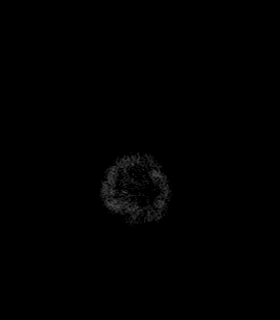

[Series 12: T1 · axial · 1.0mm · 1.00mm/px · z∈[-48,+110]mm · 11 of 160 slices shown (2 of 2)]
[im 1/160]
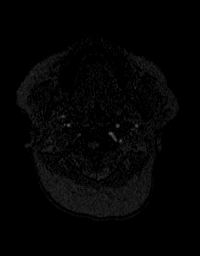
[im 16/160]
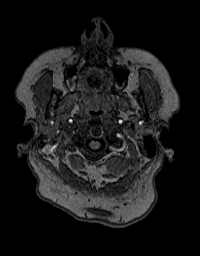
[im 32/160]
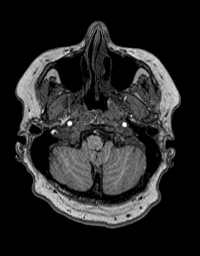
[im 48/160]
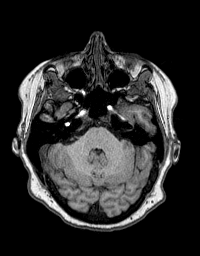
[im 64/160]
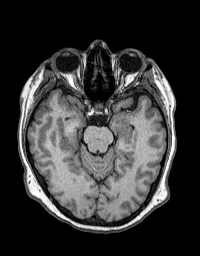
[im 80/160]
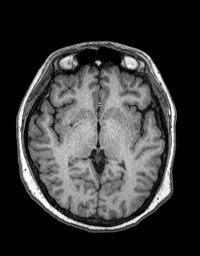
[im 96/160]
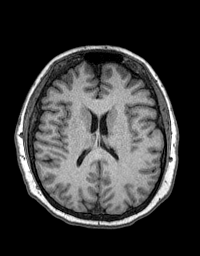
[im 112/160]
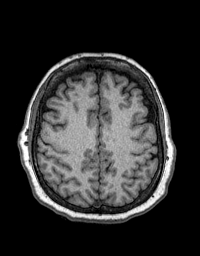
[im 128/160]
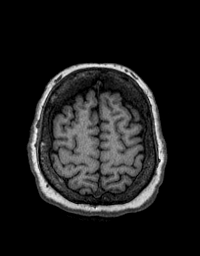
[im 144/160]
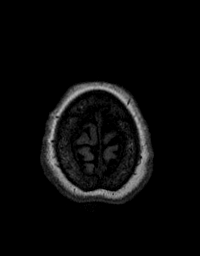
[im 160/160]
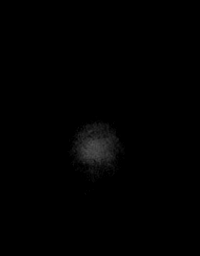

[Series 13: T2 · coronal · 5.0mm · 0.43mm/px · 2 of 29 slices shown (3 of 3)]
[im 1/29]
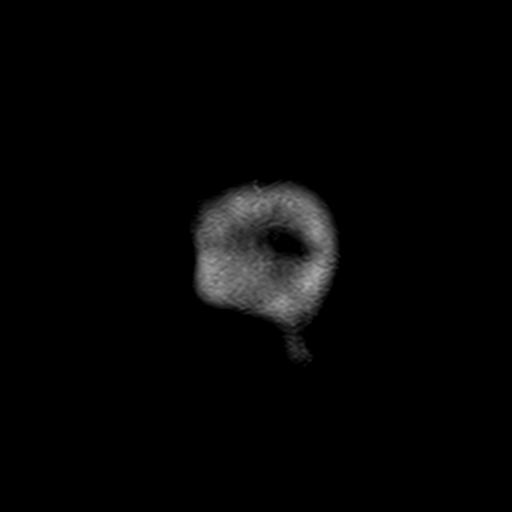
[im 29/29]
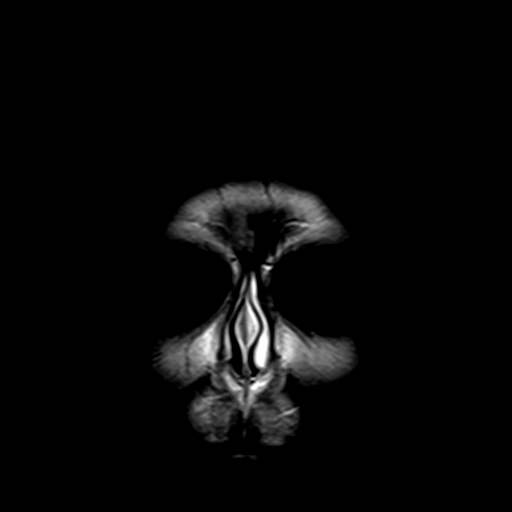

[48 of 48 positions shown; findings below may reference images not displayed]

FINDINGS: Brain: Cerebral volume within normal limits for patient age. No
focal parenchymal signal abnormality identified.

No abnormal foci of restricted diffusion to suggest acute or
subacute ischemia. Gray-white matter differentiation well
maintained. No encephalomalacia to suggest chronic infarction. No
foci of susceptibility artifact to suggest acute or chronic
intracranial hemorrhage.

No mass lesion, midline shift or mass effect. No hydrocephalus. No
extra-axial fluid collection. Major dural sinuses are grossly
patent.

Pituitary gland and suprasellar region are normal. Midline
structures intact and normal.

Vascular: Major intracranial vascular flow voids well maintained and
normal in appearance.

Skull and upper cervical spine: Craniocervical junction normal.
Visualized upper cervical spine within normal limits. Bone marrow
signal intensity normal. No scalp soft tissue abnormality.

Sinuses/Orbits: Globes and orbital soft tissues within normal
limits.

Paranasal sinuses are clear. No mastoid effusion. Inner ear
structures normal.

Other: None.
IMPRESSION: Normal brain MRI. No acute intracranial abnormality or findings to
explain patient's symptoms identified. No evidence for demyelinating
disease.

## 2020-06-24 IMAGING — MR MR CERVICAL SPINE W/O CM
5 series · 41 of 48 positions shown · non-contrast
Comparison: None.

CLINICAL DATA: Initial evaluation for paresthesias, muscle
weakness, brain fog, back pain radiating into the upper and lower
extremities. Question demyelinating disease.

EXAM:
MRI CERVICAL SPINE WITHOUT CONTRAST
TECHNIQUE: Multiplanar, multisequence MR imaging of the cervical spine was
performed. No intravenous contrast was administered.

[Series 4: T2 · sagittal · 3.0mm · 0.69mm/px · 8 of 17 slices shown (1 of 2)]
[im 1/17]
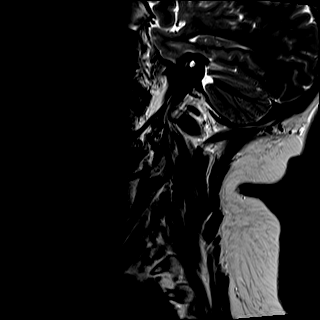
[im 3/17]
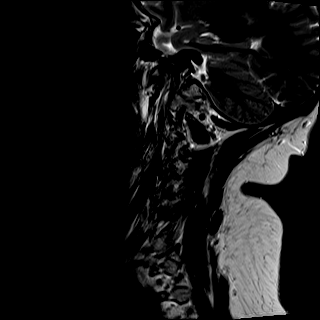
[im 5/17]
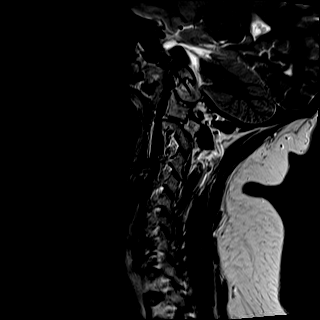
[im 7/17]
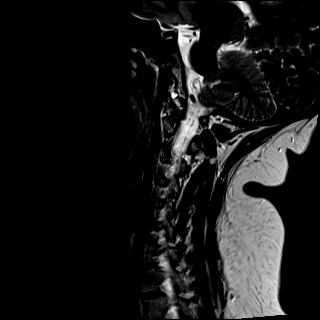
[im 10/17]
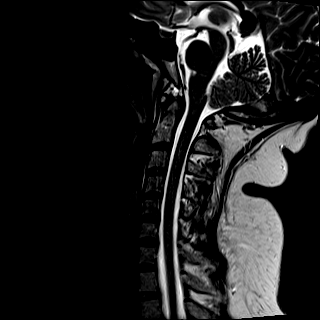
[im 12/17]
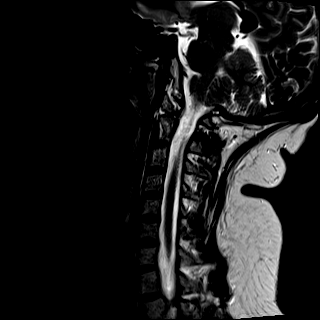
[im 14/17]
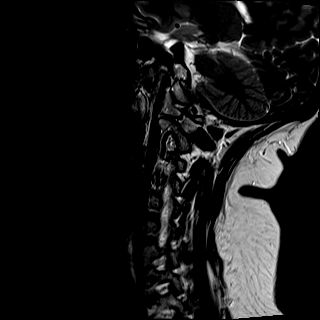
[im 17/17]
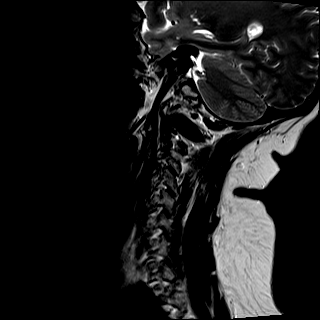

[Series 5: T1 · sagittal · 3.0mm · 0.86mm/px · 6 of 13 slices shown]
[im 1/13]
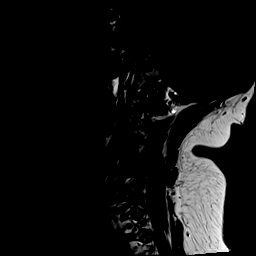
[im 3/13]
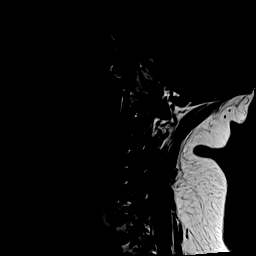
[im 5/13]
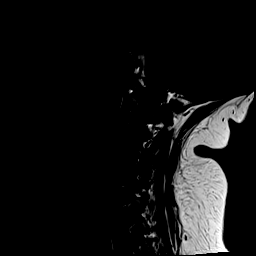
[im 8/13]
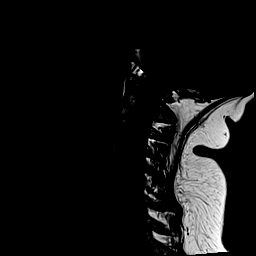
[im 10/13]
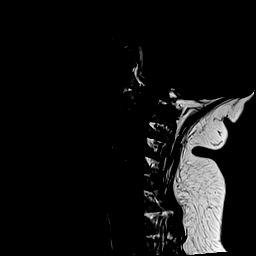
[im 13/13]
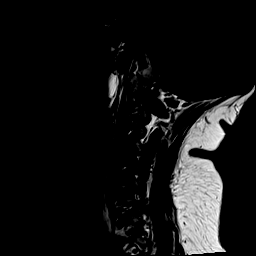

[Series 6: STIR · sagittal · 3.0mm · 0.69mm/px · 8 of 17 slices shown]
[im 1/17]
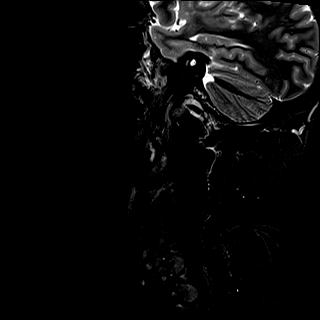
[im 3/17]
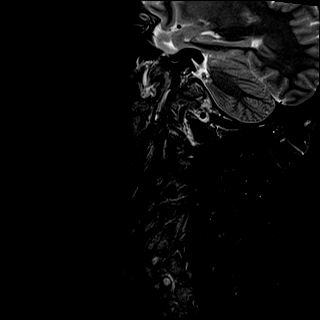
[im 5/17]
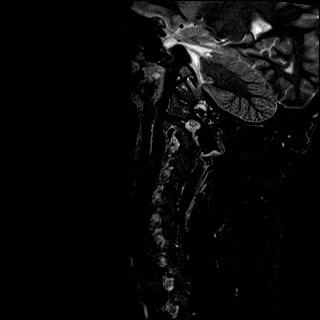
[im 7/17]
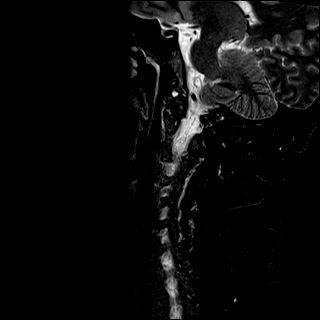
[im 10/17]
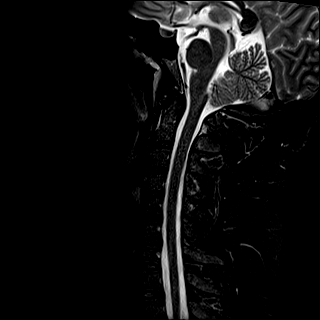
[im 12/17]
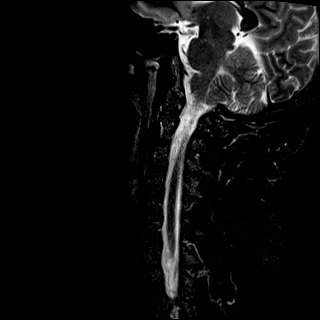
[im 14/17]
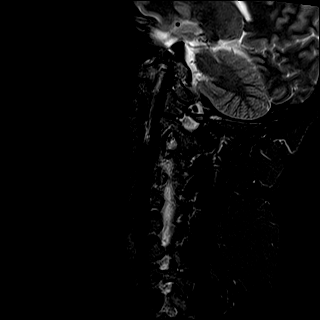
[im 17/17]
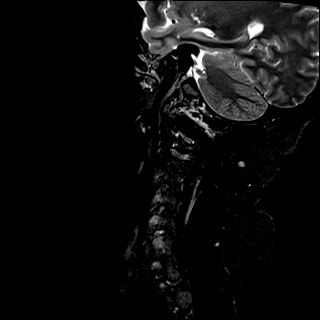

[Series 7: T2 · axial · 3.0mm · 0.62mm/px · z∈[-172,-73]mm · 11 of 27 slices shown (2 of 2)]
[im 1/27]
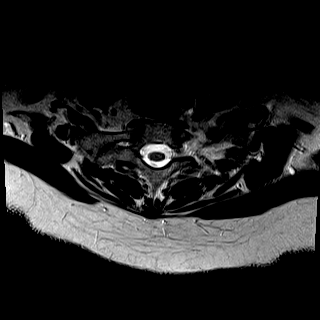
[im 3/27]
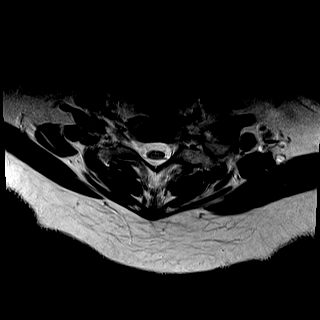
[im 5/27]
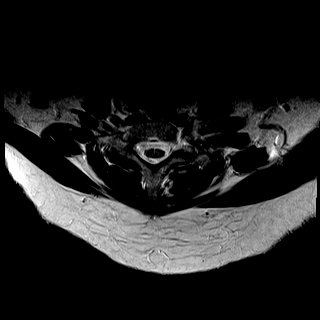
[im 7/27]
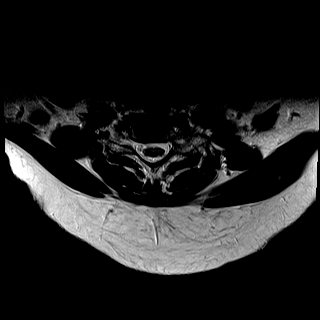
[im 9/27]
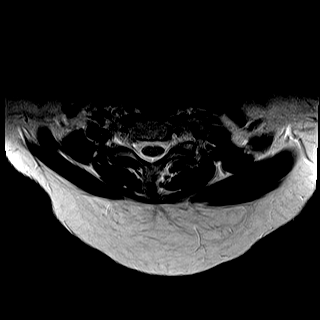
[im 11/27]
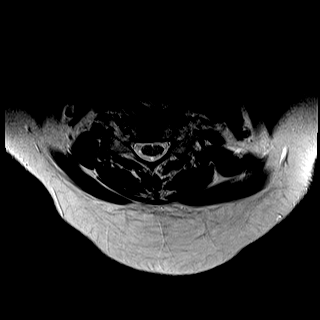
[im 14/27]
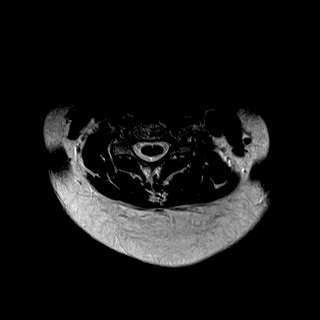
[im 16/27]
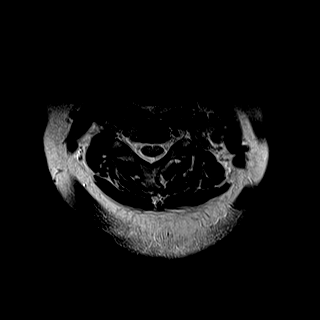
[im 18/27]
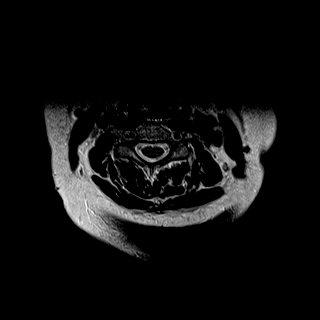
[im 22/27]
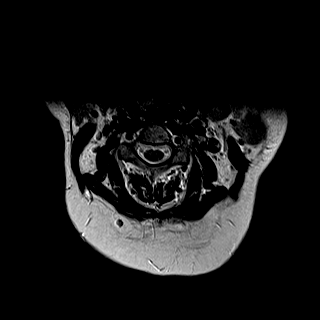
[im 27/27]
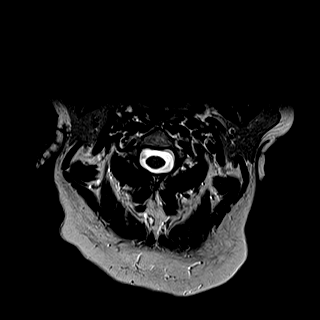

[Series 8: mpgr ax · axial · 3.0mm · 0.35mm/px · z∈[-165,-66]mm · 8 of 27 slices shown]
[im 1/27]
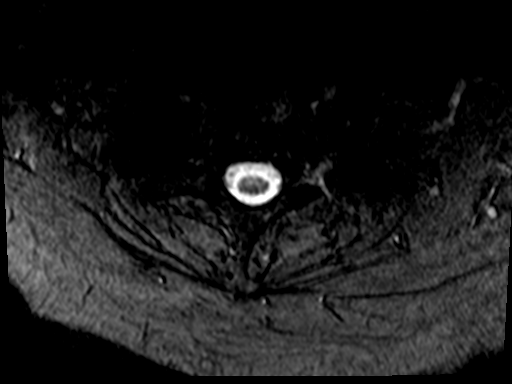
[im 5/27]
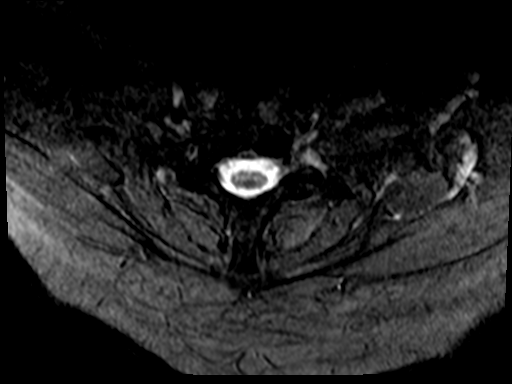
[im 9/27]
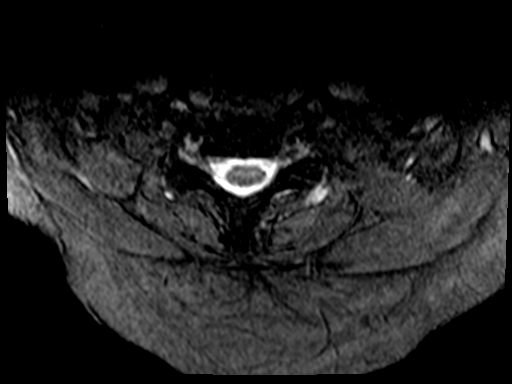
[im 11/27]
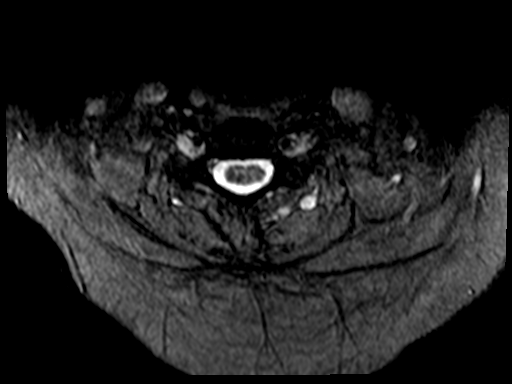
[im 16/27]
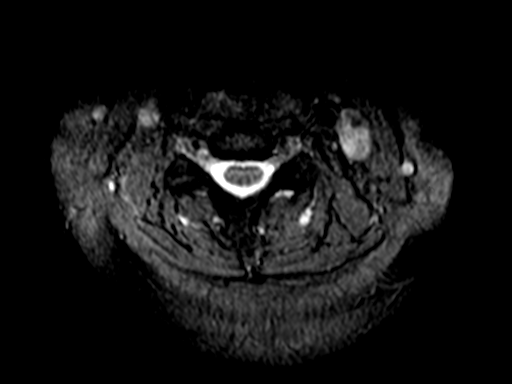
[im 18/27]
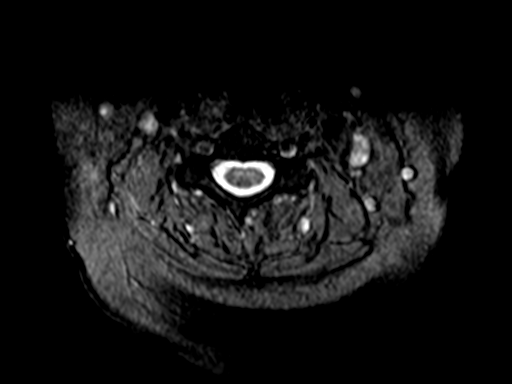
[im 22/27]
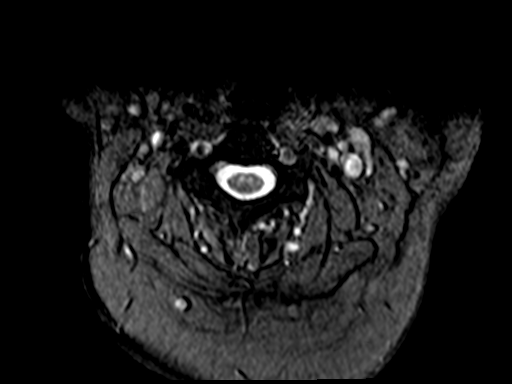
[im 27/27]
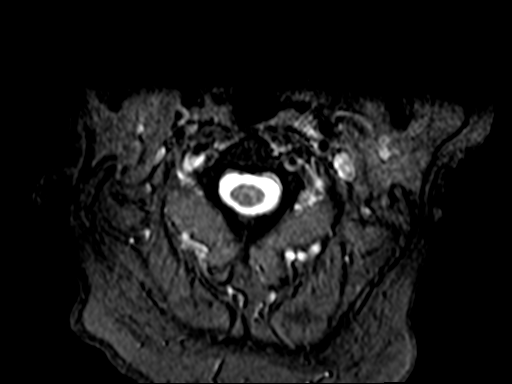

[41 of 48 positions shown; findings below may reference images not displayed]

FINDINGS: Alignment: Straightening of the normal cervical lordosis. No
listhesis.

Vertebrae: Vertebral body height well maintained without acute or
chronic fracture. Bone marrow signal intensity within normal limits.
No discrete or worrisome osseous lesions. No abnormal marrow edema.

Cord: Normal signal and morphology. No cord signal changes to
suggest demyelinating disease.

Posterior Fossa, vertebral arteries, paraspinal tissues: Visualized
brain and posterior fossa within normal limits. Craniocervical
junction normal. Paraspinous and prevertebral soft tissues within
normal limits. Normal intravascular flow voids seen within the
vertebral arteries bilaterally.

Disc levels:

No significant disc pathology seen within the cervical spine. No
significant disc bulge or focal disc herniation. No canal or neural
foraminal stenosis or evidence for neural impingement.
IMPRESSION: Normal MRI of the cervical spine. No findings to suggest
demyelinating disease or other abnormality.

## 2020-06-25 ENCOUNTER — Ambulatory Visit (INDEPENDENT_AMBULATORY_CARE_PROVIDER_SITE_OTHER): Payer: BC Managed Care – PPO | Admitting: Family Medicine

## 2020-06-25 ENCOUNTER — Ambulatory Visit: Payer: BC Managed Care – PPO | Admitting: Family Medicine

## 2020-06-25 VITALS — BP 128/78 | HR 86 | Ht 68.0 in | Wt 260.0 lb

## 2020-06-25 DIAGNOSIS — R202 Paresthesia of skin: Secondary | ICD-10-CM

## 2020-06-25 DIAGNOSIS — E538 Deficiency of other specified B group vitamins: Secondary | ICD-10-CM | POA: Diagnosis not present

## 2020-06-25 DIAGNOSIS — G894 Chronic pain syndrome: Secondary | ICD-10-CM

## 2020-06-25 DIAGNOSIS — M797 Fibromyalgia: Secondary | ICD-10-CM

## 2020-06-25 NOTE — Progress Notes (Signed)
MRI cervical spine is normal.  No evidence of a pinched nerve or multiple sclerosis

## 2020-06-25 NOTE — Progress Notes (Signed)
MRI of the brain is normal.  No evidence of multiple sclerosis.

## 2020-06-25 NOTE — Patient Instructions (Signed)
Thank you for coming in today.  Ok to continue the higher dose lyrica for 1 more week. If no different go back to the 75mg  dose.   I am arrange for neurology referral for EMG/Nerve conduction study.   I am also referring to [facility] pain management.

## 2020-06-25 NOTE — Progress Notes (Signed)
MRI thoracic spine shows a little bit of disc bulging at T5-6 but it does not look like it is pressing on anything.  I do not think this would explain the significance of your pain.  We could proceed to nerve conduction study if you would like.  Please let me know.  Happy to have you return to clinic to discuss the results in full detail.

## 2020-06-25 NOTE — Progress Notes (Signed)
I, Christoper Fabian, LAT, ATC, am serving as scribe for Dr. Clementeen Graham.  [name] [name] is a 35 y.o. female who presents to [facility] at [facility] today for brain, C-spine and T-spine MRI review.  She was last seen by Dr. Denyse Amass on 06/19/20 c/o increased back pain and increased paresthesias in her L leg and new paresthesias noted in her L UE and was referred for multiple MRIs.  Since her last visit, pt reports increased pain and continued numbness down L leg to foot.  She also has twitching and paresthesia on her left side.  MRI was completed to evaluate the thoracic back pain as well as rule out MRI and evaluate for structural cause of her persistent upper and lower pain and paresthesia on the left side.  Diagnostic testing: Brain, C-spine and T-spine MRI- 06/24/20; L-spine MRI- 07/16/19  Pertinent review of systems: No fevers or chills  Relevant historical information: Fibromyalgia, history of Roux-en-Y.  Recent intussusception requiring surgery.  Bipolar 2 disorder.  History B12 deficiency requiring IM supplementation   Exam:  BP 128/78 (BP Location: Right Arm, Patient Position: Sitting, Cuff Size: Large)   Pulse 86   Ht 5\' 8"  (1.727 m)   Wt 260 lb (117.9 kg)   SpO2 100%   BMI 39.53 kg/m  General: Well Developed, well nourished,  Psych: Tearful affect  MSK: Normal upper and lower extremity motion.  Normal gait.    Lab and Radiology Results No results found for this or any previous visit (from the past 72 hour(s)). MR BRAIN WO CONTRAST  Result Date: 06/24/2020 CLINICAL DATA:  Initial evaluation for paresthesias, muscle weakness, brain fog, back pain with radiation into the extremities. Question demyelinating disease. Possible previous head injury. EXAM: MRI HEAD WITHOUT CONTRAST TECHNIQUE: Multiplanar, multiecho pulse sequences of the brain and surrounding structures were obtained without intravenous contrast. COMPARISON:  Prior CT from 07/14/2019. FINDINGS:  Brain: Cerebral volume within normal limits for patient age. No focal parenchymal signal abnormality identified. No abnormal foci of restricted diffusion to suggest acute or subacute ischemia. Gray-white matter differentiation well maintained. No encephalomalacia to suggest chronic infarction. No foci of susceptibility artifact to suggest acute or chronic intracranial hemorrhage. No mass lesion, midline shift or mass effect. No hydrocephalus. No extra-axial fluid collection. Major dural sinuses are grossly patent. Pituitary gland and suprasellar region are normal. Midline structures intact and normal. Vascular: Major intracranial vascular flow voids well maintained and normal in appearance. Skull and upper cervical spine: Craniocervical junction normal. Visualized upper cervical spine within normal limits. Bone marrow signal intensity normal. No scalp soft tissue abnormality. Sinuses/Orbits: Globes and orbital soft tissues within normal limits. Paranasal sinuses are clear. No mastoid effusion. Inner ear structures normal. Other: None. IMPRESSION: Normal brain MRI. No acute intracranial abnormality or findings to explain patient's symptoms identified. No evidence for demyelinating disease. Electronically Signed   By: 07/16/2019 M.D.   On: 06/24/2020 18:50   MR CERVICAL SPINE WO CONTRAST  Result Date: 06/24/2020 CLINICAL DATA:  Initial evaluation for paresthesias, muscle weakness, brain fog, back pain radiating into the upper and lower extremities. Question demyelinating disease. EXAM: MRI CERVICAL SPINE WITHOUT CONTRAST TECHNIQUE: Multiplanar, multisequence MR imaging of the cervical spine was performed. No intravenous contrast was administered. COMPARISON:  None. FINDINGS: Alignment: Straightening of the normal cervical lordosis. No listhesis. Vertebrae: Vertebral body height well maintained without acute or chronic fracture. Bone marrow signal intensity within normal limits. No discrete or worrisome  osseous lesions. No abnormal  marrow edema. Cord: Normal signal and morphology. No cord signal changes to suggest demyelinating disease. Posterior Fossa, vertebral arteries, paraspinal tissues: Visualized brain and posterior fossa within normal limits. Craniocervical junction normal. Paraspinous and prevertebral soft tissues within normal limits. Normal intravascular flow voids seen within the vertebral arteries bilaterally. Disc levels: No significant disc pathology seen within the cervical spine. No significant disc bulge or focal disc herniation. No canal or neural foraminal stenosis or evidence for neural impingement. IMPRESSION: Normal MRI of the cervical spine. No findings to suggest demyelinating disease or other abnormality. Electronically Signed   By: Rise Mu M.D.   On: 06/24/2020 18:53   MR THORACIC SPINE WO CONTRAST  Result Date: 06/24/2020 CLINICAL DATA:  Initial evaluation for paresthesias, muscle weakness, back pain radiating into the upper and lower extremities. EXAM: MRI THORACIC SPINE WITHOUT CONTRAST TECHNIQUE: Multiplanar, multisequence MR imaging of the thoracic spine was performed. No intravenous contrast was administered. COMPARISON:  None available. FINDINGS: Alignment: Physiologic with preservation of the normal thoracic kyphosis. No listhesis. Vertebrae: Vertebral body height maintained without acute or chronic fracture. Scattered chronic endplate Schmorl's node deformities noted throughout the mid and lower thoracic spine. Bone marrow signal intensity within normal limits. 1.8 cm hemangioma noted at the posterior aspect of the T10 vertebral body. Additional probable small subcentimeter hemangioma at the T3 vertebral body. No other discrete or worrisome osseous lesions. No abnormal marrow edema. Cord: Normal signal and morphology. No cord signal changes to suggest demyelinating disease or other abnormality. Conus medullaris terminates at approximately the T12-L1 level.  Paraspinal and other soft tissues: Unremarkable. Disc levels: T5-6: Tiny central disc protrusion minimally indents the ventral thecal sac (series 12, image 17). No significant spinal stenosis or cord deformity. Otherwise, no significant disc pathology seen within the thoracic spine. No canal or foraminal stenosis. No neural impingement. IMPRESSION: 1. Normal MRI of the thoracic spine and spinal cord. No evidence for demyelinating disease or other abnormality. 2. Tiny central disc protrusion at T5-6 without stenosis or impingement. Electronically Signed   By: Rise Mu M.D.   On: 06/24/2020 18:42   I, Clementeen Graham, personally (independently) visualized and performed the interpretation of the images attached in this note.     Assessment and Plan: 35 y.o. female with persistent pain and upper and lower extremity paresthesia.  Patient does have some potential causes of this including B12 deficiency, fibromyalgia.  However her symptoms are worse than I would expect for these conditions.  She has failed conservative management.  I proceeded with central nervous system MRI evaluation to rule out multiple sclerosis.  Fortunately she does not have MS lesions on MRI imaging.  However her recent MRIs do not explain the severity of her symptoms.  At this point next best step is nerve conduction study to evaluate for peripheral neuropathy or other causes of paresthesia.  However I am preparing [name] for the possibility that the nerve study is normal.  At this point I will not have a good cause of her pain aside from fibromyalgia.  I think we should also be starting a referral to pain management.  This will be a second opinion and pain management may have some additional ideas to help control her pain without using opiates.  She already is on max dose of Lyrica and other medicines that I would use in similar situations have not been helpful in the past.  For her B12 deficiency that was evaluated recently  patient has restarted injectable B12 recently.  Hopefully  this will help some too.   PDMP not reviewed this encounter. Orders Placed This Encounter  Procedures  . Ambulatory referral to Neurology    Referral Priority:   Routine    Referral Type:   Consultation    Referral Reason:   Specialty Services Required    Requested Specialty:   Neurology    Number of Visits Requested:   1  . Ambulatory referral to Physical Medicine Rehab    Referral Priority:   Routine    Referral Type:   Rehabilitation    Referral Reason:   Specialty Services Required    Requested Specialty:   Physical Medicine and Rehabilitation    Number of Visits Requested:   1  . NCV with EMG(electromyography)    Standing Status:   Future    Standing Expiration Date:   06/25/2021    Scheduling Instructions:     Left upper and lower pain and paresthesia    Order Specific Question:   Where should this test be performed?    Answer:   [facility]   No orders of the defined types were placed in this encounter.    Discussed warning signs or symptoms. Please see discharge instructions. Patient expresses understanding.   The above documentation has been reviewed and is accurate and complete Clementeen Graham, M.D.  Total encounter time 30 minutes including face-to-face time with the patient and, reviewing past medical record, and charting on the date of service.   MRI review detailed discussion of current understanding of pain and next steps in treatment plan and options.

## 2020-06-26 ENCOUNTER — Encounter: Payer: Self-pay | Admitting: Family Medicine

## 2020-06-28 ENCOUNTER — Encounter: Payer: Self-pay | Admitting: Family Medicine

## 2020-06-28 ENCOUNTER — Other Ambulatory Visit: Payer: Self-pay

## 2020-06-28 ENCOUNTER — Ambulatory Visit (INDEPENDENT_AMBULATORY_CARE_PROVIDER_SITE_OTHER): Payer: BC Managed Care – PPO | Admitting: Diagnostic Neuroimaging

## 2020-06-28 ENCOUNTER — Encounter (INDEPENDENT_AMBULATORY_CARE_PROVIDER_SITE_OTHER): Payer: BC Managed Care – PPO | Admitting: Diagnostic Neuroimaging

## 2020-06-28 DIAGNOSIS — R202 Paresthesia of skin: Secondary | ICD-10-CM

## 2020-06-28 DIAGNOSIS — G894 Chronic pain syndrome: Secondary | ICD-10-CM

## 2020-06-28 DIAGNOSIS — Z0289 Encounter for other administrative examinations: Secondary | ICD-10-CM

## 2020-06-28 NOTE — Procedures (Signed)
[facility]  NCS (NERVE CONDUCTION STUDY) WITH EMG (ELECTROMYOGRAPHY) REPORT   STUDY DATE: 06/28/20 PATIENT NAME: [name] [name] DOB: February 21, 1986 MRN: [id number]  ORDERING CLINICIAN: Teressa Lower, MD  TECHNOLOGIST: Durenda Age ELECTROMYOGRAPHER: Glenford Bayley. Shruthi Northrup, MD  CLINICAL INFORMATION: 35 year old female with generalized pain and weakness.  FINDINGS: NERVE CONDUCTION STUDY:  Left median, left ulnar, left peroneal and left tibial motor responses are normal.  Left median, left ulnar, left sural and left superficial peroneal sensory responses are normal.  Left median ulnar transcarpal comparison study is normal.  Left ulnar and left tibial F wave latencies are normal.    NEEDLE ELECTROMYOGRAPHY:  Needle examination of left lower extremity is normal.   IMPRESSION:   Normal study.  No electrodiagnostic evidence of large fiber neuropathy or myopathy at this time.    INTERPRETING PHYSICIAN:  Suanne Marker, MD Certified in Neurology, Neurophysiology and Neuroimaging  [facility] [address], [address] [place], [place] [address] [phone]  [facility]    Nerve / Sites Muscle Latency Ref. Amplitude Ref. Rel Amp Segments Distance Velocity Ref. Area    ms ms mV mV %  cm m/s m/s mVms  L Median - APB     Wrist APB 3.2 ?4.4 10.5 ?4.0 100 Wrist - APB 7   41.3     Upper arm APB 6.7  10.1  96.3 Upper arm - Wrist 20 56 ?49 41.0  L Ulnar - ADM     Wrist ADM 2.5 ?3.3 9.3 ?6.0 100 Wrist - ADM 7   20.8     B.Elbow ADM 5.2  8.3  88.7 B.Elbow - Wrist 18 66 ?49 20.6     A.Elbow ADM 6.8  7.2  87.3 A.Elbow - B.Elbow 10 66 ?49 19.0         A.Elbow - Wrist      L Peroneal - EDB     Ankle EDB 4.5 ?6.5 4.4 ?2.0 100 Ankle - EDB 9   15.7     Fib head EDB 10.4  3.4  76.9 Fib head - Ankle 29 49 ?44 12.6     Pop fossa EDB 12.5  3.3  97.2 Pop fossa - Fib head 10 48 ?44 12.6         Pop fossa - Ankle      L Tibial - AH     Ankle AH 4.5 ?5.8 6.2 ?4.0  100 Ankle - AH 9   21.0     Pop fossa AH 12.0  5.9  95.9 Pop fossa - Ankle 34 45 ?41 24.3             SNC    Nerve / Sites Rec. Site Peak Lat Ref.  Amp Ref. Segments Distance Peak Diff Ref.    ms ms V V  cm ms ms  L Sural - Ankle (Calf)     Calf Ankle 3.6 ?4.4 9 ?6 Calf - Ankle 14    L Superficial peroneal - Ankle     Lat leg Ankle 3.1 ?4.4 8 ?6 Lat leg - Ankle 12    L Median, Ulnar - Transcarpal comparison     Median Palm Wrist 2.0 ?2.2 46 ?35 Median Palm - Wrist 8       Ulnar Palm Wrist 1.8 ?2.2 18 ?12 Ulnar Palm - Wrist 8          Median Palm - Ulnar Palm  0.2 ?0.4  L Median - Orthodromic (Dig II, Mid palm)  Dig II Wrist 2.7 ?3.4 33 ?10 Dig II - Wrist 13    L Ulnar - Orthodromic, (Dig V, Mid palm)     Dig V Wrist 2.7 ?3.1 10 ?5 Dig V - Wrist 88                 F  Wave    Nerve F Lat Ref.   ms ms  L Tibial - AH 50.2 ?56.0  L Ulnar - ADM 25.9 ?32.0         EMG Summary Table    Spontaneous MUAP Recruitment  Muscle IA Fib PSW Fasc Other Amp Dur. Poly Pattern  L. Vastus medialis Normal None None None _______ Normal Normal Normal Normal  L. Tibialis anterior Normal None None None _______ Normal Normal Normal Normal  L. Gastrocnemius (Medial head) Normal None None None _______ Normal Normal Normal Normal

## 2020-06-29 MED ORDER — BACLOFEN 10 MG PO TABS: 10.0000 mg | ORAL_TABLET | Freq: Three times a day (TID) | ORAL | 3 refills | Status: DC | PRN

## 2020-06-29 NOTE — Progress Notes (Signed)
EMG is normal.  This means that I think a lot of your pain is probably coming from fibromyalgia.  I do think that pain management is the next step.

## 2020-07-03 DIAGNOSIS — F1910 Other psychoactive substance abuse, uncomplicated: Secondary | ICD-10-CM | POA: Diagnosis not present

## 2020-07-03 DIAGNOSIS — F3181 Bipolar II disorder: Secondary | ICD-10-CM | POA: Diagnosis not present

## 2020-07-13 ENCOUNTER — Encounter: Payer: Self-pay | Admitting: Physical Medicine & Rehabilitation

## 2020-07-13 ENCOUNTER — Encounter
Payer: BC Managed Care – PPO | Attending: Physical Medicine & Rehabilitation | Admitting: Physical Medicine & Rehabilitation

## 2020-07-13 ENCOUNTER — Other Ambulatory Visit: Payer: Self-pay

## 2020-07-13 VITALS — BP 131/85 | HR 88 | Temp 98.7°F | Ht 64.0 in | Wt 254.0 lb

## 2020-07-13 DIAGNOSIS — M47816 Spondylosis without myelopathy or radiculopathy, lumbar region: Secondary | ICD-10-CM | POA: Diagnosis not present

## 2020-07-13 DIAGNOSIS — M797 Fibromyalgia: Secondary | ICD-10-CM | POA: Diagnosis not present

## 2020-07-13 MED ORDER — PREGABALIN 100 MG PO CAPS: 100.0000 mg | ORAL_CAPSULE | Freq: Two times a day (BID) | ORAL | 0 refills | Status: DC

## 2020-07-13 NOTE — Patient Instructions (Signed)
Please resume recumbent bicycle at least 3 days a week

## 2020-07-13 NOTE — Progress Notes (Signed)
Subjective:    Patient ID: [name] [name], female    DOB: 1986/04/27, 35 y.o.   MRN: [id number]  HPI Chief complaint widespread body pain 35 year old female referred by sports medicine for the evaluation of multiple pain complaints.  She has a past medical history of morbid obesity which prompted gastric bypass.  While she initially had a brisk weight loss, she states that she gained about 70 pounds back and is now in the process of trying to lose it once again.  The patient had a postoperative complication of "candy cane bowel". The patient has psychiatric history significant for bipolar disorder requiring high-dose mood stabilizers. The patient has had an extensive work-up of her widespread body pain complaints.  She has had MRI of the brain, cervical spine, thoracic spine lumbar spine, none of which showed any serious abnormalities.  Very small disc at T5-6 which is noncompressive, there was mild facet degeneration at L4-5 L5-S1 requiring clinical correlation.   The patient had been on gabapentin with gradually increasing dosing prescribed by primary care.  The patient states that this was not very effective.  He was trialed on pregabalin by sports medicine and did not have any side effects at a 75 mg twice daily dose.  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 The patient used to exercise, she has had Multiple bunionectomies which has limited her weightbearing.  She was advised to do recumbent bicycle which she has done up until Covid.  She started working and no longer exercised. Pain Inventory Average Pain 7 Pain Right Now 7 My pain is constant, tingling and aching  In the last 24 hours, has pain interfered with the following? General activity 7 Relation with others 7 Enjoyment of life 8 What TIME of day is your pain at its worst? morning , evening and night Sleep (in  general) Poor  Pain is worse with: walking, bending, sitting and standing Pain improves with: medication Relief from Meds: 4  walk without assistance how many minutes can you walk? 15-30 ability to climb steps?  yes do you drive?  yes Do you have any goals in this area?  no  not employed: date last employed . I need assistance with the following:  meal prep, household duties and shopping  weakness numbness tremor tingling trouble walking spasms dizziness confusion depression anxiety  new  new    Family History  Problem Relation Age of Onset  . Hyperlipidemia Father   . Hypertension Father   . Kidney disease Father   . Colon cancer Father 61  . Cancer Father   . Depression Father   . Anxiety disorder Father   . Bipolar disorder Father   . Sleep apnea Father   . Obesity Father   . Hypertension Mother   . Depression Mother   . Anxiety disorder Mother   . Obesity Mother   . Hypertension Maternal Grandmother   . Uterine cancer Maternal Grandmother 21  . Diabetes Maternal Grandfather    Social History   Socioeconomic History  . Marital status: Married    Spouse name: Not on file  . Number of children: Not on file  . Years of education: Not on file  . Highest education level: Not on file  Occupational History  . Occupation: stay at home mom  Tobacco Use  . Smoking status: Never Smoker  . Smokeless tobacco: Never  Used  Vaping Use  . Vaping Use: Never used  Substance and Sexual Activity  . Alcohol use: Yes    Comment: rare/socially  . Drug use: No  . Sexual activity: Not on file    Comment: tubal ligation  Other Topics Concern  . Not on file  Social History Narrative   Married, lives with spouse and dtr born 07/2009. Employed as Associate Professor at [employer].   Social Determinants of Health   Financial Resource Strain: Not on file  Food Insecurity: Not on file  Transportation Needs: Not on file  Physical Activity: Not on file  Stress: Not on file   Social Connections: Not on file   Past Surgical History:  Procedure Laterality Date  . Quintella Reichert OSTEOTOMY Left 06/24/2017   Procedure: Ralene Bathe;  Surgeon: [name], DPM;  Location: [facility];  Service: Podiatry;  Laterality: Left;  . BUNIONECTOMY Left 06/24/2017   Procedure: Anthoney Harada;  Surgeon: [name], DPM;  Location: [facility];  Service: Podiatry;  Laterality: Left;  . CESAREAN SECTION     x 2  . CHOLECYSTECTOMY N/A 02/15/2019   Procedure: LAPAROSCOPIC CHOLECYSTECTOMY WITH INTRAOPERATIVE CHOLANGIOGRAM;  Surgeon: Andria Meuse, MD;  Location: [facility];  Service: General;  Laterality: N/A;  . COLON RESECTION N/A 05/04/2020   Procedure: DIAGNOSTIC LAPAROSCOPY, RESECTION OF CANDYCANE, UPPER ENDOSCOPY;  Surgeon: Berna Bue, MD;  Location: [facility];  Service: General;  Laterality: N/A;  . FOOT SURGERY    . 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   Procedure: LAPAROSCOPIC ROUX-EN-Y GASTRIC;  Surgeon: Atilano Ina, MD;  Location: [facility];  Service: General;  Laterality: N/A;  laparoscopic roux-en-y gastric bypass  . LITHOTRIPSY Left   . TONSILLECTOMY    . TUBAL LIGATION    . UPPER GI ENDOSCOPY  08/24/2012   Procedure: UPPER GI ENDOSCOPY;  Surgeon: Atilano Ina, MD;  Location: [facility];  Service: General;;  . WISDOM TOOTH EXTRACTION     Past Medical History:  Diagnosis Date  . ADD (attention deficit disorder)   . Anemia   . Anxiety   . B12 deficiency   . Back pain   . Bilateral swelling of feet   . Bipolar 2 disorder (HCC) 09/19/2019  . Constipation   . DEPRESSION   . Elevated cholesterol   . Family history of colon cancer May 21, 2019   Father, 86s; deceased.   . Fatty liver   . Fibromyalgia   . Gallbladder problem   . GERD (gastroesophageal reflux disease)   . Heartburn   . History of stomach ulcers   . Hypertension   . Hypothyroidism    Hx of, normalized TSH during pregnancy 2011  . Infertility, female   . Lactose  intolerance   . Migraines   . Morbid obesity (HCC)    s/p RY 08/2012 - start weight 290 pounds  . Nephrolithiasis   . Palpitations   . Panic disorder   . PCOS (polycystic ovarian syndrome)   . PONV (postoperative nausea and vomiting)   . Pregnancy induced hypertension   . Shortness of breath   . Vitamin D deficiency    BP 131/85   Pulse 88   Temp 98.7 F (37.1 C)   Ht 5\' 4"  (1.626 m)   Wt 254 lb (115.2 kg)   SpO2 98%   BMI 43.60 kg/m   Opioid Risk Score:   Fall Risk Score:  `1  Depression screen PHQ 2/9  Depression screen PHQ  2/9 07/13/2020 03/12/2020 12/07/2019 09/06/2019 07/14/2019 07/12/2019 03/11/2019  Decreased Interest 2 3 3 2 2 2 1   Down, Depressed, Hopeless 2 3 3 2 2 1 1   PHQ - 2 Score 4 6 6 4 4 3 2   Altered sleeping 3 1 1 1 2 3 3   Tired, decreased energy 3 3 3 2 2 1 1   Change in appetite - 3 3 1 1 1 2   Feeling bad or failure about yourself  2 3 3 1  0 0 1  Trouble concentrating - 3 3 3 2 3  0  Moving slowly or fidgety/restless 1 3 1  0 0 0 0  Suicidal thoughts 0 1 2 0 0 0 0  PHQ-9 Score 13 23 22 12 11 11 9   Difficult doing work/chores Extremely dIfficult Extremely dIfficult Extremely dIfficult Very difficult Somewhat difficult Extremely dIfficult Somewhat difficult  Some recent data might be hidden    Review of Systems  Constitutional: Positive for unexpected weight change.  HENT: Negative.   Eyes: Negative.   Respiratory: Negative.   Cardiovascular: Negative.   Gastrointestinal: Positive for abdominal pain, diarrhea, nausea and vomiting.  Endocrine: Negative.   Genitourinary: Negative.   Musculoskeletal: Positive for arthralgias, back pain, gait problem, neck pain and neck stiffness.  Skin: Negative.   Allergic/Immunologic: Negative.   Neurological: Positive for tremors, weakness and numbness.       Tingling   Hematological: Negative.   Psychiatric/Behavioral: Positive for confusion and dysphoric mood. The patient is nervous/anxious.   All other systems  reviewed and are negative.      Objective:   Physical Exam Vitals and nursing note reviewed. Exam conducted with a chaperone present (Patient's mother).  Constitutional:      Appearance: She is obese.  HENT:     Head: Normocephalic and atraumatic.  Eyes:     Extraocular Movements: Extraocular movements intact.     Conjunctiva/sclera: Conjunctivae normal.     Pupils: Pupils are equal, round, and reactive to light.  Cardiovascular:     Rate and Rhythm: Normal rate and regular rhythm.     Heart sounds: Normal heart sounds. No murmur heard.   Pulmonary:     Effort: Pulmonary effort is normal. No respiratory distress.     Breath sounds: Normal breath sounds. No stridor.  Abdominal:     General: Abdomen is flat. Bowel sounds are normal. There is no distension.     Palpations: Abdomen is soft. There is no mass.  Musculoskeletal:     Right lower leg: No edema.     Left lower leg: No edema.     Comments: Tenderness to palpation bilateral levator scapula insertion Tenderness at iliac crest bilaterally Tenderness bilateral greater trochanters Tenderness bilateral PSIS area  Patient has normal lumbar flexion and extension but has pain in the lower lumbar area with extension.  Right foot with bunionectomy scar, hypersensitive to touch Left foot with bunionectomy scar which is not hypersensitive to touch no other foot lesions noted   Skin:    General: Skin is warm and dry.  Neurological:     General: No focal deficit present.     Mental Status: She is alert and oriented to person, place, and time.     Cranial Nerves: No dysarthria or facial asymmetry.     Sensory: Sensation is intact.     Motor: No weakness or tremor.     Coordination: Coordination is intact.     Gait: Gait is intact.     Deep Tendon  Reflexes:     Reflex Scores:      Tricep reflexes are 3+ on the right side and 3+ on the left side.      Bicep reflexes are 3+ on the right side and 3+ on the left side.       Brachioradialis reflexes are 3+ on the right side and 3+ on the left side.      Patellar reflexes are 3+ on the right side and 3+ on the left side.      Achilles reflexes are 3+ on the right side and 3+ on the left side.    Comments: Motor strength is 5/5 bilateral deltoid bicep tricep grip hip flexor knee extensor ankle dorsiflexor and plantar flexor Negative straight leg raising bilaterally.  Psychiatric:        Mood and Affect: Mood normal.        Behavior: Behavior normal.        Thought Content: Thought content normal.           Assessment & Plan:  #1.  Widespread body pain with work-up including entire spine MRI brain MRI and EMG/NCV which demonstrated no central nervous system or peripheral nervous system abnormalities. The patient has had normal thyroid studies, normal sed rate, metabolic panel other than low K+.  There was a slightly low vitamin D level, supplementation recommended 800 units per day  Would increase Lyrica to 100 mg twice daily and will likely need to titrate upward on a monthly basis.  Will monitor for adverse side effects  I have recommended that she resume her recumbent bicycling every other day.   2.  Chronic low back pain the patient has pain that correlates to her MRI findings of mild lumbar spondylosis.  Her pain however is moderate/severe in the low back area.  She has extension mediated pain which also would be consistent with lumbar spondylosis/facet syndrome.  We will schedule for lumbar medial branch blocks.  We discussed that if the patient has 2 sets of medial branch blocks at L3-L4 as well as L5 dorsal ramus that reduce her typical low back pain by 50%, she would be a candidate for radiofrequency neurotomy of the same nerves

## 2020-07-14 ENCOUNTER — Other Ambulatory Visit: Payer: Self-pay | Admitting: Family Medicine

## 2020-07-16 NOTE — Telephone Encounter (Signed)
Last refill: 06/22/20 #15, 0 Last OV: 05/28/20 dx. Fibromyalgia

## 2020-07-17 DIAGNOSIS — M797 Fibromyalgia: Secondary | ICD-10-CM | POA: Diagnosis not present

## 2020-07-17 DIAGNOSIS — M9905 Segmental and somatic dysfunction of pelvic region: Secondary | ICD-10-CM | POA: Diagnosis not present

## 2020-07-17 DIAGNOSIS — M9902 Segmental and somatic dysfunction of thoracic region: Secondary | ICD-10-CM | POA: Diagnosis not present

## 2020-07-17 DIAGNOSIS — M9903 Segmental and somatic dysfunction of lumbar region: Secondary | ICD-10-CM | POA: Diagnosis not present

## 2020-07-19 ENCOUNTER — Ambulatory Visit: Payer: BC Managed Care – PPO | Admitting: Psychology

## 2020-07-20 ENCOUNTER — Other Ambulatory Visit: Payer: Self-pay | Admitting: Family Medicine

## 2020-07-23 DIAGNOSIS — M9903 Segmental and somatic dysfunction of lumbar region: Secondary | ICD-10-CM | POA: Diagnosis not present

## 2020-07-23 DIAGNOSIS — M9905 Segmental and somatic dysfunction of pelvic region: Secondary | ICD-10-CM | POA: Diagnosis not present

## 2020-07-23 DIAGNOSIS — M797 Fibromyalgia: Secondary | ICD-10-CM | POA: Diagnosis not present

## 2020-07-23 DIAGNOSIS — M9902 Segmental and somatic dysfunction of thoracic region: Secondary | ICD-10-CM | POA: Diagnosis not present

## 2020-07-24 NOTE — Telephone Encounter (Signed)
Please call patient; I will not refill this sleep medication. As we discussed; this was for short term use. Needs f/u if still having problems. thanks

## 2020-07-24 NOTE — Telephone Encounter (Signed)
LAST APPOINTMENT DATE: 06/06/2020   NEXT APPOINTMENT DATE: Visit date not found    LAST REFILL: 06/22/2020  QTY: 15

## 2020-07-25 ENCOUNTER — Encounter: Payer: Self-pay | Admitting: Family Medicine

## 2020-07-27 DIAGNOSIS — M797 Fibromyalgia: Secondary | ICD-10-CM | POA: Diagnosis not present

## 2020-07-27 DIAGNOSIS — M9905 Segmental and somatic dysfunction of pelvic region: Secondary | ICD-10-CM | POA: Diagnosis not present

## 2020-07-27 DIAGNOSIS — M9902 Segmental and somatic dysfunction of thoracic region: Secondary | ICD-10-CM | POA: Diagnosis not present

## 2020-07-27 DIAGNOSIS — M9903 Segmental and somatic dysfunction of lumbar region: Secondary | ICD-10-CM | POA: Diagnosis not present

## 2020-07-30 DIAGNOSIS — M9903 Segmental and somatic dysfunction of lumbar region: Secondary | ICD-10-CM | POA: Diagnosis not present

## 2020-07-30 DIAGNOSIS — M797 Fibromyalgia: Secondary | ICD-10-CM | POA: Diagnosis not present

## 2020-07-30 DIAGNOSIS — M9905 Segmental and somatic dysfunction of pelvic region: Secondary | ICD-10-CM | POA: Diagnosis not present

## 2020-07-30 DIAGNOSIS — M9902 Segmental and somatic dysfunction of thoracic region: Secondary | ICD-10-CM | POA: Diagnosis not present

## 2020-08-01 MED ORDER — CLONAZEPAM 1 MG PO TABS: 0.5000 mg | ORAL_TABLET | Freq: Two times a day (BID) | ORAL | 5 refills | Status: DC | PRN

## 2020-08-10 ENCOUNTER — Encounter: Payer: Self-pay | Admitting: Physical Medicine & Rehabilitation

## 2020-08-10 ENCOUNTER — Encounter
Payer: BC Managed Care – PPO | Attending: Physical Medicine & Rehabilitation | Admitting: Physical Medicine & Rehabilitation

## 2020-08-10 ENCOUNTER — Other Ambulatory Visit: Payer: Self-pay

## 2020-08-10 VITALS — BP 134/87 | HR 91 | Temp 98.7°F | Ht 64.0 in | Wt 256.4 lb

## 2020-08-10 DIAGNOSIS — M47816 Spondylosis without myelopathy or radiculopathy, lumbar region: Secondary | ICD-10-CM | POA: Diagnosis not present

## 2020-08-10 MED ORDER — PREGABALIN 150 MG PO CAPS: 150.0000 mg | ORAL_CAPSULE | Freq: Two times a day (BID) | ORAL | 1 refills | Status: DC

## 2020-08-10 NOTE — Patient Instructions (Signed)

## 2020-08-10 NOTE — Progress Notes (Signed)

## 2020-08-10 NOTE — Progress Notes (Signed)
  PROCEDURE RECORD [facility]   Name: KASONDRA JUNOD DOB:06/29/1985 MRN: [id number]  Date:08/10/2020  Physician: Claudette Laws, MD    Nurse/CMA: Nedra Hai, CMA  Allergies:  Allergies  Allergen Reactions  . Nsaids Other (See Comments)    Gastric bypass "gastric bypass"  . Other Other (See Comments)    "sleep walking"  . Zolpidem Other (See Comments)    Sleep walking  Sleep walking     Consent Signed: Yes.    Is patient diabetic? No.  CBG today?   Pregnant: No. LMP: No LMP recorded. Patient has had an ablation. (age 45-55)  Anticoagulants: no Anti-inflammatory: no Antibiotics: no  Procedure: Bilateral L3-4-5 Medial Branch Block Position: Prone Start Time: 11:59am End Time: 12:09pm Fluoro Time:52  RN/CMA Nedra Hai, CMA Lee, CMA    Time 11;27am 12:18pm    BP 134/87 143/93    Pulse 91 82    Respirations 16 16    O2 Sat 96 98    S/S 6 6    Pain Level 6/10 2/10     D/C home with stepmother, patient A & O X 3, D/C instructions reviewed, and sits independently.

## 2020-08-14 ENCOUNTER — Encounter: Payer: BC Managed Care – PPO | Admitting: Physical Medicine & Rehabilitation

## 2020-08-20 ENCOUNTER — Encounter: Payer: Self-pay | Admitting: Family Medicine

## 2020-08-24 ENCOUNTER — Other Ambulatory Visit: Payer: Self-pay

## 2020-08-24 ENCOUNTER — Ambulatory Visit: Payer: BC Managed Care – PPO | Admitting: Physician Assistant

## 2020-08-24 ENCOUNTER — Encounter: Payer: Self-pay | Admitting: Physician Assistant

## 2020-08-24 VITALS — BP 120/78 | HR 93 | Temp 98.3°F | Ht 64.0 in | Wt 265.0 lb

## 2020-08-24 DIAGNOSIS — Z111 Encounter for screening for respiratory tuberculosis: Secondary | ICD-10-CM | POA: Diagnosis not present

## 2020-08-24 DIAGNOSIS — Z0289 Encounter for other administrative examinations: Secondary | ICD-10-CM

## 2020-08-24 DIAGNOSIS — Z0184 Encounter for antibody response examination: Secondary | ICD-10-CM

## 2020-08-24 NOTE — Patient Instructions (Signed)
It was great to see you!  We will be in touch with blood work results and need for updated vaccines.  Return Monday for TB skin test reading.  Take care,  Jarold Motto PA-C

## 2020-08-24 NOTE — Progress Notes (Signed)
[name] [name] is a 35 y.o. female is here for paperwork to be filled out.  I acted as a Education administrator for [employer], PA-C Anselmo Pickler, LPN   History of Present Illness:   Chief Complaint  Patient presents with  . 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 She is UTD with eye doctor and denies any vision concerns. Denies: chest pain, SOB, hearing difficulties. Does have back pain but it does not prevent her from lifting things around her home. She also needs immunity status testing for Hep B, MMR. She also requires PPD testing.   Health Maintenance Due  Topic Date Due  . Hepatitis C Screening  Never done  . PAP SMEAR-Modifier  04/14/2020  . COVID-19 Vaccine (4 - Booster) 06/25/2020    Past Medical History:  Diagnosis Date  . ADD (attention deficit disorder)   . Anemia   . Anxiety   . B12 deficiency   . Back pain   . Bilateral swelling of feet   . Bipolar 2 disorder ([place]) 09/19/2019  . Constipation   . DEPRESSION   . Elevated cholesterol   . Family history of colon cancer 05/18/2019   Father, 59s; deceased.   . Fatty liver   . Fibromyalgia   . Gallbladder problem   . GERD (gastroesophageal reflux disease)   . Heartburn   . History of stomach ulcers   . Hypertension   . Hypothyroidism    Hx of, normalized TSH during pregnancy 2011  . Infertility, female   . Lactose intolerance   . Migraines   . Morbid obesity ([place])    s/p RY 08/2012 - start weight 290 pounds  . Nephrolithiasis   . Palpitations   . Panic disorder   . PCOS (polycystic ovarian syndrome)   . PONV (postoperative nausea and vomiting)   . Pregnancy induced hypertension   . Shortness of breath   . Vitamin D deficiency      Social History   Tobacco Use  . Smoking status: Never Smoker  . Smokeless tobacco: Never  Used  Vaping Use  . Vaping Use: Never used  Substance Use Topics  . Alcohol use: Yes    Comment: rare/socially  . Drug use: No    Past Surgical History:  Procedure Laterality Date  . Barbie Banner OSTEOTOMY Left 06/24/2017   Procedure: [employer];  Surgeon: Trula Slade, DPM;  Location: [place] [facility];  Service: Podiatry;  Laterality: Left;  . BUNIONECTOMY Left 06/24/2017   Procedure: Annye English;  Surgeon: Trula Slade, DPM;  Location: [facility];  Service: Podiatry;  Laterality: Left;  . CESAREAN SECTION     x 2  . CHOLECYSTECTOMY N/A 02/15/2019   Procedure: LAPAROSCOPIC CHOLECYSTECTOMY WITH INTRAOPERATIVE CHOLANGIOGRAM;  Surgeon: Ileana Roup, MD;  Location: [facility];  Service: General;  Laterality: N/A;  . COLON RESECTION N/A 05/04/2020   Procedure: DIAGNOSTIC LAPAROSCOPY, RESECTION OF CANDYCANE, UPPER ENDOSCOPY;  Surgeon: Clovis Riley, MD;  Location: [facility];  Service: General;  Laterality: N/A;  . FOOT SURGERY    . 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   Procedure: LAPAROSCOPIC ROUX-EN-Y GASTRIC;  Surgeon: Gayland Curry, MD;  Location: [facility];  Service: General;  Laterality: N/A;  laparoscopic roux-en-y gastric bypass  . LITHOTRIPSY Left   .  TONSILLECTOMY    . TUBAL LIGATION    . UPPER GI ENDOSCOPY  08/24/2012   Procedure: UPPER GI ENDOSCOPY;  Surgeon: Gayland Curry, MD;  Location: [facility];  Service: General;;  . WISDOM TOOTH EXTRACTION      Family History  Problem Relation Age of Onset  . Hyperlipidemia Father   . Hypertension Father   . Kidney disease Father   . Colon cancer Father 77  . Cancer Father   . Depression Father   . Anxiety disorder Father   . Bipolar disorder Father   . Sleep apnea Father   . Obesity Father   . Hypertension Mother   . Depression Mother   . Anxiety disorder Mother   . Obesity Mother   . Hypertension Maternal Grandmother   . Uterine cancer Maternal Grandmother 76  . Diabetes Maternal  Grandfather     PMHx, SurgHx, SocialHx, FamHx, Medications, and Allergies were reviewed in the Visit Navigator and updated as appropriate.   Patient Active Problem List   Diagnosis Date Noted  . Abdominal pain 05/03/2020  . Dehydration 05/03/2020  . Nausea & vomiting 05/03/2020  . Intussusception of small intestine ([place]) 05/03/2020  . Plantar fasciitis 10/31/2019  . Bipolar 2 disorder ([place]) 09/19/2019  . Fibromyalgia 09/09/2019  . Ischial bursitis of left side 07/19/2019  . Vitamin B12 deficiency 06/01/2019  . Vitamin D deficiency 06/01/2019  . Panic disorder 06/01/2019  . Family history of colon cancer 05/16/2019  . Chronic constipation 05/16/2019  . Major depression, recurrent, chronic ([place]) 05/16/2019  . Insomnia due to other mental disorder 03/13/2019  . Pain in both lower extremities 11/21/2018  . Nocturnal leg cramps 10/26/2018  . Nephrolithiasis   . Reduced libido, due to Lexapro 07/09/2017  . Binge eating disorder 04/23/2017  . Morbid obesity ([place]) 04/23/2017  . Acquired iron deficiency anemia due to decreased absorption 02/22/2015  . 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 Smoking status: Never Smoker  . Smokeless tobacco: Never Used  Vaping Use  . Vaping Use: Never used  Substance Use Topics  . Alcohol use: Yes    Comment: rare/socially  . Drug use: No    Current Medications and Allergies:    Current Outpatient Medications:  .  acetaminophen (TYLENOL) 500 MG tablet, Take 2 tablets (1,000 mg total) by mouth every 6 (six) hours as needed for moderate pain., Disp: 30 tablet, Rfl: 0 .  albuterol (VENTOLIN HFA) 108 (90 Base) MCG/ACT inhaler, Inhale 2 puffs into the lungs every 4 (four) hours as needed for wheezing or shortness of breath. , Disp: , Rfl:  .  baclofen (LIORESAL) 10 MG tablet, Take 1 tablet (10 mg total) by mouth 3 (three) times daily as needed for muscle spasms., Disp: 90 tablet, Rfl: 3 .   beclomethasone (QVAR REDIHALER) 40 MCG/ACT inhaler, Inhale 1 puff into the lungs 2 (two) times daily., Disp: 1 each, Rfl: 4 .  busPIRone (BUSPAR) 15 MG tablet, Take 15 mg by mouth 3 (three) times daily., Disp: , Rfl:  .  clonazePAM (KLONOPIN) 1 MG tablet, Take 0.5-1 tablets (0.5-1 mg total) by mouth 2 (two) times daily as needed for anxiety., Disp: 60 tablet, Rfl: 5 .  cyanocobalamin (,VITAMIN B-12,) 1000 MCG/ML injection, Inject 1 vial per week for 3 weeks, then 1 vial per month thereafter, Disp: 6 mL, Rfl: 3 .  diclofenac Sodium (VOLTAREN) 1 % GEL, Apply 4 g topically 4 (four) times  daily. (Patient taking differently: Apply 4 g topically 4 (four) times daily as needed (pain).), Disp: 100 g, Rfl: 0 .  docusate sodium (COLACE) 100 MG capsule, Take 1 capsule (100 mg total) by mouth 2 (two) times daily., Disp: 30 capsule, Rfl: 2 .  hydrOXYzine (VISTARIL) 50 MG capsule, Take 50 mg by mouth in the morning, at noon, and at bedtime. , Disp: , Rfl:  .  lamoTRIgine (LAMICTAL) 150 MG tablet, Take 300 mg by mouth daily. , Disp: , Rfl:  .  lithium carbonate (LITHOBID) 300 MG CR tablet, Take 600 mg by mouth at bedtime., Disp: , Rfl:  .  metFORMIN (GLUCOPHAGE) 500 MG tablet, Take 500 mg by mouth 2 (two) times daily., Disp: , Rfl:  .  ondansetron (ZOFRAN ODT) 4 MG disintegrating tablet, 53m ODT q4 hours prn nausea/vomit (Patient taking differently: Take 4 mg by mouth every hour as needed for nausea or vomiting.), Disp: 10 tablet, Rfl: 0 .  pregabalin (LYRICA) 150 MG capsule, Take 1 capsule (150 mg total) by mouth 2 (two) times daily., Disp: 60 capsule, Rfl: 1 .  QUEtiapine (SEROQUEL) 400 MG tablet, Take 400 mg by mouth at bedtime. , Disp: , Rfl:  .  QUEtiapine (SEROQUEL) 50 MG tablet, Take by mouth., Disp: , Rfl:  .  SYRINGE-NEEDLE, DISP, 3 ML 25G X 1" 3 ML MISC, Use 1 needle per application once weekly, Disp: 12 each, Rfl: 0 .  tizanidine (ZANAFLEX) 2 MG capsule, TAKE 2 CAPSULES (4 MG TOTAL) BY MOUTH 3 (THREE)  TIMES DAILY AS NEEDED FOR MUSCLE SPASMS., Disp: 540 capsule, Rfl: 1 .  valACYclovir (VALTREX) 1000 MG tablet, Take 1 tablet (1,000 mg total) by mouth 3 (three) times daily. (Patient taking differently: Take 1,000 mg by mouth 3 (three) times daily as needed (cold sores).), Disp: 21 tablet, Rfl: 0 .  Vitamin D, Ergocalciferol, (DRISDOL) 1.25 MG (50000 UT) CAPS capsule, Take 50,000 Units by mouth once a week. Fridays, Disp: , Rfl:    Allergies  Allergen Reactions  . Nsaids Other (See Comments)    Gastric bypass "gastric bypass"  . Other Other (See Comments)    "sleep walking"  . Zolpidem Other (See Comments)    Sleep walking  Sleep walking     Review of Systems   ROS Negative unless otherwise specified per HPI.  Vitals:   Vitals:   08/24/20 0801  BP: 120/78  Pulse: 93  Temp: 98.3 F (36.8 C)  TempSrc: Temporal  SpO2: 99%  Weight: 265 lb (120.2 kg)  Height: _0  (1.626 m)     Body mass index is 45.49 kg/m.   Physical Exam:    Physical Exam Vitals and nursing note reviewed.  Constitutional:      General: She is not in acute distress.    Appearance: She is well-developed. She is not ill-appearing or toxic-appearing.  Cardiovascular:     Rate and Rhythm: Normal rate and regular rhythm.     Pulses: Normal pulses.     Heart sounds: Normal heart sounds, S1 normal and S2 normal.     Comments: No LE edema Pulmonary:     Effort: Pulmonary effort is normal.     Breath sounds: Normal breath sounds.  Skin:    General: Skin is warm and dry.  Neurological:     Mental Status: She is alert.     GCS: GCS eye subscore is 4. GCS verbal subscore is 5. GCS motor subscore is 6.  Psychiatric:  Speech: Speech normal.        Behavior: Behavior normal. Behavior is cooperative.      Assessment and Plan:    Emmely was seen today for health certification.  Diagnoses and all orders for this visit:  Immunity status testing; Visit for TB skin test; Screening-pulmonary TB;  Encounter for completion of form with patient Form completed. Heart and lung exam WNL. Immunity testing performed. TB test done, return on Monday for reading. -     Measles/Mumps/Rubella Immunity -     Hepatitis B surface antibody,qualitative -     TB Skin Test  CMA or LPN served as scribe during this visit. History, Physical, and Plan performed by medical provider. The above documentation has been reviewed and is accurate and complete.  Inda Coke, PA-C [place], [place] 08/24/2020  Follow-up: No follow-ups on file.

## 2020-08-27 ENCOUNTER — Ambulatory Visit: Payer: BC Managed Care – PPO

## 2020-08-27 ENCOUNTER — Other Ambulatory Visit: Payer: Self-pay

## 2020-08-27 LAB — MEASLES/MUMPS/RUBELLA IMMUNITY
Mumps IgG: 71.6 AU/mL
Rubella: 2.28 Index
Rubeola IgG: 29.1 AU/mL

## 2020-08-27 LAB — HEPATITIS B SURFACE ANTIBODY,QUALITATIVE: Hep B S Ab: NONREACTIVE

## 2020-08-28 ENCOUNTER — Other Ambulatory Visit: Payer: Self-pay | Admitting: Physician Assistant

## 2020-08-29 ENCOUNTER — Telehealth: Payer: Self-pay

## 2020-08-29 NOTE — Telephone Encounter (Signed)
Pt is returning Donna's call. 

## 2020-08-29 NOTE — Telephone Encounter (Signed)
See result notes. 

## 2020-08-30 ENCOUNTER — Encounter: Payer: Self-pay | Admitting: Physical Medicine & Rehabilitation

## 2020-08-30 ENCOUNTER — Encounter
Payer: BC Managed Care – PPO | Attending: Physical Medicine & Rehabilitation | Admitting: Physical Medicine & Rehabilitation

## 2020-08-30 ENCOUNTER — Other Ambulatory Visit: Payer: Self-pay

## 2020-08-30 VITALS — BP 110/80 | HR 74 | Temp 98.0°F | Ht 64.0 in | Wt 265.4 lb

## 2020-08-30 DIAGNOSIS — M47816 Spondylosis without myelopathy or radiculopathy, lumbar region: Secondary | ICD-10-CM | POA: Insufficient documentation

## 2020-08-30 MED ORDER — PREGABALIN 200 MG PO CAPS: 200.0000 mg | ORAL_CAPSULE | Freq: Two times a day (BID) | ORAL | 0 refills | Status: DC

## 2020-08-30 NOTE — Patient Instructions (Signed)

## 2020-08-30 NOTE — Progress Notes (Signed)
  PROCEDURE RECORD [facility]   Name: [name] [name] DOB:06/03/1986 MRN: [id number]  Date:08/30/2020  Physician: Claudette Laws, MD    Nurse/CMA: Nedra Hai, CMA  Allergies:  Allergies  Allergen Reactions  . Nsaids Other (See Comments)    Gastric bypass "gastric bypass"  . Other Other (See Comments)    "sleep walking"  . Zolpidem Other (See Comments)    Sleep walking  Sleep walking     Consent Signed: Yes.    Is patient diabetic? No.  CBG today?  Pregnant: No. LMP: No LMP recorded. Patient has had an ablation. (age 65-55)  Anticoagulants: no Anti-inflammatory: no Antibiotics: no  Procedure: Bilateral L3-4-5 Medial Branch Block Position: Prone Start Time: 10:49am End Time: 10:58am Fluoro Time: 1:01  RN/CMA  Nedra Hai, CMA    Time  11:06am    BP  115/80    Pulse  86    Respirations  14    O2 Sat  96    S/S  6    Pain Level 10/10 4/10     D/C home with stepmom, patient A & O X 3, D/C instructions reviewed, and sits independently.

## 2020-08-30 NOTE — Progress Notes (Signed)
Bilateral Lumbar L3, L4  medial branch blocks and L 5 dorsal ramus injection under fluoroscopic guidance  Indication: Lumbar pain which is not relieved by medication management or other conservative care and interfering with self-care and mobility.  Informed consent was obtained after describing risks and benefits of the procedure with the patient, this includes bleeding, infection, paralysis and medication side effects.  The patient wishes to proceed and has given written consent.  The patient was placed in prone position.  The lumbar area was marked and prepped with Betadine.  One mL of 1% lidocaine was injected into each of 6 areas into the skin and subcutaneous tissue.  Then a 22-gauge 5in spinal needle was inserted targeting the junction of the left S1 superior articular process and sacral ala junction. Needle was advanced under fluoroscopic guidance.  Bone contact was made.  Isovue 200 was injected x 0.5 mL demonstrating no intravascular uptake.  Then a solution  of 2% MPF lidocaine was injected x 0.5 mL.  Then the left L5 superior articular process in transverse process junction was targeted.  Bone contact was made.  Isovue 200 was injected x 0.5 mL demonstrating no intravascular uptake. Then a solution containing  2% MPF lidocaine was injected x 0.5 mL.  Then the left L4 superior articular process in transverse process junction was targeted.  Bone contact was made.  Isovue 200 was injected x 0.5 mL demonstrating no intravascular uptake.  Then a solution containing2% MPF lidocaine was injected x 0.5 mL.  This same procedure was performed on the right side using the same needle, technique and injectate.  Patient tolerated procedure well.  Post procedure instructions were given. Pre injection pain 9/10 Post injection pain 4/10  Schedule for Right L3-4-5 RF in 4 wks

## 2020-08-30 NOTE — Progress Notes (Signed)
  PROCEDURE RECORD [facility]   Name: [name] [name] DOB:15-Nov-1985 MRN: [id number]  Date:08/30/2020  Physician: Claudette Laws, MD    Nurse/CMA:Lee CMA  Allergies:  Allergies  Allergen Reactions  . Nsaids Other (See Comments)    Gastric bypass "gastric bypass"  . Other Other (See Comments)    "sleep walking"  . Zolpidem Other (See Comments)    Sleep walking  Sleep walking     Consent Signed: Yes.    Is patient diabetic? No.  CBG today?   Pregnant: No. LMP: No LMP recorded. Patient has had an ablation. (age 39-55)  Anticoagulants: no Anti-inflammatory: no Antibiotics: no  Procedure: bilateral medial branch blocks L3-4-5 Position: Prone Start Time:  End Time: Fluoro Time:   RN/CMA Shumaker RN Lee CMA    Time 10:20     BP 110/80     Pulse 74     Respirations 14     O2 Sat 95     S/S 6 6    Pain Level       D/C home with stepmother, patient A & O X 3, D/C instructions reviewed, and sits independently.

## 2020-08-31 ENCOUNTER — Ambulatory Visit
Admission: EM | Admit: 2020-08-31 | Discharge: 2020-08-31 | Disposition: A | Discharge status: Home / Self Care | Payer: BC Managed Care – PPO | Attending: Physician Assistant | Admitting: Physician Assistant

## 2020-08-31 ENCOUNTER — Other Ambulatory Visit: Payer: Self-pay

## 2020-08-31 ENCOUNTER — Encounter: Payer: Self-pay | Admitting: Emergency Medicine

## 2020-08-31 DIAGNOSIS — J45909 Unspecified asthma, uncomplicated: Secondary | ICD-10-CM | POA: Diagnosis not present

## 2020-08-31 DIAGNOSIS — Z20822 Contact with and (suspected) exposure to covid-19: Secondary | ICD-10-CM | POA: Diagnosis not present

## 2020-08-31 DIAGNOSIS — J069 Acute upper respiratory infection, unspecified: Secondary | ICD-10-CM | POA: Diagnosis not present

## 2020-08-31 DIAGNOSIS — J029 Acute pharyngitis, unspecified: Secondary | ICD-10-CM | POA: Insufficient documentation

## 2020-08-31 DIAGNOSIS — Z7951 Long term (current) use of inhaled steroids: Secondary | ICD-10-CM | POA: Insufficient documentation

## 2020-08-31 DIAGNOSIS — R0981 Nasal congestion: Secondary | ICD-10-CM | POA: Diagnosis not present

## 2020-08-31 DIAGNOSIS — Z7984 Long term (current) use of oral hypoglycemic drugs: Secondary | ICD-10-CM | POA: Diagnosis not present

## 2020-08-31 DIAGNOSIS — Z791 Long term (current) use of non-steroidal anti-inflammatories (NSAID): Secondary | ICD-10-CM | POA: Diagnosis not present

## 2020-08-31 DIAGNOSIS — Z79899 Other long term (current) drug therapy: Secondary | ICD-10-CM | POA: Diagnosis not present

## 2020-08-31 DIAGNOSIS — Z886 Allergy status to analgesic agent status: Secondary | ICD-10-CM | POA: Insufficient documentation

## 2020-08-31 LAB — GROUP A STREP BY PCR: Group A Strep by PCR: NOT DETECTED

## 2020-08-31 LAB — SARS CORONAVIRUS 2 (TAT 6-24 HRS): SARS Coronavirus 2: NEGATIVE

## 2020-08-31 MED ORDER — HYDROCOD POLST-CPM POLST ER 10-8 MG/5ML PO SUER: 5.0000 mL | Freq: Two times a day (BID) | ORAL | 0 refills | Status: DC | PRN

## 2020-08-31 NOTE — ED Provider Notes (Signed)
[facility]    CSN: [id number] Arrival date & time: 08/31/20  0801      History   Chief Complaint Chief Complaint  Patient presents with  . Cough  . Sore Throat  . Nasal Congestion    HPI [name] [name] is a 35 y.o. female who presents with onset of cough, ST, nose congestion and body aches x 3 days. She has been also nauseous and having diarrhea with average 3 Bm's per day x 3 days. Denies fever, chills or sweats. Her asthma is flaring up which is not unusual when she gets cold and been using her inhalers. Her cough is non productive. PTC meds as noted per triage note have not helped.  Has had Covid injections series.     Past Medical History:  Diagnosis Date  . ADD (attention deficit disorder)   . Anemia   . Anxiety   . B12 deficiency   . Back pain   . Bilateral swelling of feet   . Bipolar 2 disorder ([place]) 09/19/2019  . Constipation   . DEPRESSION   . Elevated cholesterol   . Family history of colon cancer 29-May-2019   Father, 41s; deceased.   . Fatty liver   . Fibromyalgia   . Gallbladder problem   . GERD (gastroesophageal reflux disease)   . Heartburn   . History of stomach ulcers   . Hypertension   . Hypothyroidism    Hx of, normalized TSH during pregnancy 2011  . Infertility, female   . Lactose intolerance   . Migraines   . Morbid obesity ([place])    s/p RY 08/2012 - start weight 290 pounds  . Nephrolithiasis   . Palpitations   . Panic disorder   . PCOS (polycystic ovarian syndrome)   . PONV (postoperative nausea and vomiting)   . Pregnancy induced hypertension   . Shortness of breath   . Vitamin D deficiency     Patient Active Problem List   Diagnosis Date Noted  . Abdominal pain 05/03/2020  . Dehydration 05/03/2020  . Nausea & vomiting 05/03/2020  . Intussusception of small intestine ([place]) 05/03/2020  . Plantar fasciitis 10/31/2019  . Bipolar 2 disorder ([place]) 09/19/2019  . Fibromyalgia 09/09/2019  . Ischial bursitis of left  side 07/19/2019  . Vitamin B12 deficiency 06/01/2019  . Vitamin D deficiency 06/01/2019  . Panic disorder 06/01/2019  . Family history of colon cancer 29-May-2019  . Chronic constipation 2019/05/29  . Major depression, recurrent, chronic ([place]) May 29, 2019  . Insomnia due to other mental disorder 03/13/2019  . Pain in both lower extremities 11/21/2018  . Nocturnal leg cramps 10/26/2018  . Nephrolithiasis   . Reduced libido, due to Lexapro 07/09/2017  . Binge eating disorder 04/23/2017  . Morbid obesity ([place]) 04/23/2017  . Acquired iron deficiency anemia due to decreased absorption 02/22/2015  . 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 Barbie Banner OSTEOTOMY Left 06/24/2017   Procedure: [employer];  Surgeon: Trula Slade, DPM;  Location: [place];  Service: Podiatry;  Laterality: Left;  . BUNIONECTOMY Left 06/24/2017   Procedure: Annye English;  Surgeon: Trula Slade, DPM;  Location: [place];  Service: Podiatry;  Laterality: Left;  . CESAREAN SECTION     x 2  . CHOLECYSTECTOMY N/A 02/15/2019   Procedure: LAPAROSCOPIC CHOLECYSTECTOMY WITH INTRAOPERATIVE CHOLANGIOGRAM;  Surgeon: Ileana Roup, MD;  Location: [facility];  Service: General;  Laterality: N/A;  . COLON RESECTION N/A 05/04/2020   Procedure: DIAGNOSTIC LAPAROSCOPY, RESECTION OF CANDYCANE, UPPER ENDOSCOPY;  Surgeon: Clovis Riley, MD;  Location: [facility];  Service: General;  Laterality: N/A;  . FOOT SURGERY    . 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   Procedure: LAPAROSCOPIC ROUX-EN-Y GASTRIC;  Surgeon: Gayland Curry, MD;  Location: [facility];  Service: General;  Laterality: N/A;  laparoscopic roux-en-y gastric bypass  . LITHOTRIPSY Left   . TONSILLECTOMY    . TUBAL LIGATION    . UPPER GI ENDOSCOPY  08/24/2012   Procedure: UPPER GI ENDOSCOPY;  Surgeon: Gayland Curry, MD;  Location: [facility];  Service: General;;  . WISDOM  TOOTH EXTRACTION      OB History    Gravida  2   Para  2   Term  2   Preterm  0   AB  0   Living  2     SAB  0   IAB  0   Ectopic  0   Multiple  0   Live Births  2            Home Medications    Prior to Admission medications   Medication Sig Start Date End Date Taking? Authorizing Provider  acetaminophen (TYLENOL) 500 MG tablet Take 2 tablets (1,000 mg total) by mouth every 6 (six) hours as needed for moderate pain. 02/13/19  Yes Domenic Moras, PA-C  albuterol (VENTOLIN HFA) 108 (90 Base) MCG/ACT inhaler Inhale 2 puffs into the lungs every 4 (four) hours as needed for wheezing or shortness of breath.  02/21/20  Yes [provider]  baclofen (LIORESAL) 10 MG tablet Take 1 tablet (10 mg total) by mouth 3 (three) times daily as needed for muscle spasms. 06/29/20  Yes Gregor Hams, MD  beclomethasone (QVAR REDIHALER) 40 MCG/ACT inhaler Inhale 1 puff into the lungs 2 (two) times daily. 06/06/20  Yes Inda Coke, PA  busPIRone (BUSPAR) 15 MG tablet Take 15 mg by mouth 3 (three) times daily. 11/01/19  Yes [provider]  CALCIUM PO Take 1 tablet by mouth daily.   Yes [provider]  clonazePAM (KLONOPIN) 1 MG tablet Take 0.5-1 tablets (0.5-1 mg total) by mouth 2 (two) times daily as needed for anxiety. 08/01/20  Yes Leamon Arnt, MD  cyanocobalamin (,VITAMIN B-12,) 1000 MCG/ML injection Inject 1 vial per week for 3 weeks, then 1 vial per month thereafter 06/21/20  Yes Leamon Arnt, MD  diclofenac Sodium (VOLTAREN) 1 % GEL Apply 4 g topically 4 (four) times daily. Patient taking differently: Apply 4 g topically 4 (four) times daily as needed (pain). 05/13/19  Yes Palumbo, April, MD  docusate sodium (COLACE) 100 MG capsule Take 1 capsule (100 mg total) by mouth 2 (two) times daily. 05/06/20  Yes Rolm Bookbinder, MD  hydrOXYzine (VISTARIL) 50 MG capsule Take 50 mg by mouth in the morning, at noon, and at bedtime.  08/24/19  Yes [provider]  lamoTRIgine (LAMICTAL) 150 MG tablet Take 300 mg by mouth daily.  04/10/20  Yes [provider]  lithium carbonate (LITHOBID) 300 MG CR tablet Take 600 mg by mouth at bedtime.   Yes [provider]  Magnesium 500 MG TABS Take 1 tablet by mouth daily.   Yes [provider]  metFORMIN (GLUCOPHAGE) 500 MG tablet Take 500 mg by mouth 2 (two) times daily. 11/01/19  Yes [provider]  ondansetron (ZOFRAN ODT) 4 MG disintegrating tablet  86m ODT q4 hours prn nausea/vomit Patient taking differently: Take 4 mg by mouth every hour as needed for nausea or vomiting. 10/13/19  Yes FJacqlyn Larsen PA-C  pregabalin (LYRICA) 200 MG capsule Take 1 capsule (200 mg total) by mouth 2 (two) times daily. 08/30/20  Yes Kirsteins, ALuanna Salk MD  QUEtiapine (SEROQUEL) 400 MG tablet Take 400 mg by mouth at bedtime.  08/24/19  Yes [provider]  QUEtiapine (SEROQUEL) 50 MG tablet Take by mouth. 06/27/20  Yes [provider]  SYRINGE-NEEDLE, DISP, 3 ML 25G X 1" 3 ML MISC Use 1 needle per application once weekly 06/21/20  Yes ALeamon Arnt MD  tizanidine (ZANAFLEX) 2 MG capsule TAKE 2 CAPSULES (4 MG TOTAL) BY MOUTH 3 (THREE) TIMES DAILY AS NEEDED FOR MUSCLE SPASMS. 06/21/20  Yes CGregor Hams MD  valACYclovir (VALTREX) 1000 MG tablet Take 1 tablet (1,000 mg total) by mouth 3 (three) times daily. Patient taking differently: Take 1,000 mg by mouth 3 (three) times daily as needed (cold sores). 05/25/19  Yes Martin, Mary-Margaret, FNP  Vitamin D, Ergocalciferol, (DRISDOL) 1.25 MG (50000 UT) CAPS capsule Take 50,000 Units by mouth once a week. Fridays   Yes [provider]    Family History Family History  Problem Relation Age of Onset  . Hyperlipidemia Father   . Hypertension Father   . Kidney disease Father   . Colon cancer Father 556 . Cancer Father   . Depression Father   . Anxiety disorder Father   . Bipolar disorder Father   . Sleep apnea  Father   . Obesity Father   . Hypertension Mother   . Depression Mother   . Anxiety disorder Mother   . Obesity Mother   . Hypertension Maternal Grandmother   . Uterine cancer Maternal Grandmother 76  . Diabetes Maternal Grandfather     Social History Social History   Tobacco Use  . Smoking status: Never Smoker  . Smokeless tobacco: Never Used  Vaping Use  . Vaping Use: Never used  Substance Use Topics  . Alcohol use: Yes    Comment: rare  . Drug use: No     Allergies   Nsaids, Other, and Zolpidem   Review of Systems Review of Systems  Constitutional: Negative for activity change, chills, diaphoresis and fever.  HENT: Positive for postnasal drip, rhinorrhea and sore throat. Negative for congestion, ear discharge, ear pain and trouble swallowing.   Eyes: Negative for discharge.  Respiratory: Positive for cough, chest tightness and wheezing. Negative for shortness of breath.   Cardiovascular: Negative for chest pain.  Gastrointestinal: Positive for diarrhea and nausea. Negative for abdominal pain and vomiting.  Musculoskeletal: Positive for myalgias.  Skin: Negative for rash.  Neurological: Negative for headaches.  Hematological: Negative for adenopathy.     Physical Exam Triage Vital Signs ED Triage Vitals  Enc Vitals Group     BP 08/31/20 0816 (!) 131/95     Pulse Rate 08/31/20 0816 90     Resp 08/31/20 0816 18     Temp 08/31/20 0816 98.3 F (36.8 C)     Temp Source 08/31/20 0816 Oral     SpO2 08/31/20 0816 100 %     Weight 08/31/20 0817 265 lb (120.2 kg)     Height 08/31/20 0817 5' 4"  (1.626 m)     Head Circumference --      Peak Flow --      Pain Score 08/31/20 0816 6  Pain Loc --      Pain Edu? --      Excl. in [place]? --    No data found.  Updated Vital Signs BP (!) 131/95 (BP Location: Left Arm)   Pulse 90   Temp 98.3 F (36.8 C) (Oral)   Resp 18   Ht 5' 4"  (1.626 m)   Wt 265 lb (120.2 kg)   SpO2 100%   BMI 45.49 kg/m   Visual  Acuity Right Eye Distance:   Left Eye Distance:   Bilateral Distance:    Right Eye Near:   Left Eye Near:    Bilateral Near:     Physical Exam Physical Exam Vitals signs and nursing note reviewed.  Constitutional:      General: She is not in acute distress.    Appearance: Normal appearance. She is not ill-appearing, toxic-appearing or diaphoretic.  HENT:     Head: Normocephalic.     Right Ear: Tympanic membrane, ear canal and external ear normal.     Left Ear: Tympanic membrane, ear canal and external ear normal.     Nose: with clear rhinitis     Mouth/Throat: clear    Mouth: Mucous membranes are moist.  Eyes:     General: No scleral icterus.       Right eye: No discharge.        Left eye: No discharge.     Conjunctiva/sclera: Conjunctivae normal.  Neck:     Musculoskeletal: Neck supple. No neck rigidity.  Cardiovascular:     Rate and Rhythm: Normal rate and regular rhythm.     Heart sounds: No murmur.  Pulmonary: has a lot of intermittent cough while in the room    Effort: Pulmonary effort is normal.     Breath sounds: Normal breath sounds.  Abdominal:     General: Bowel sounds are normal. There is no distension.     Palpations: Abdomen is soft. There is no mass.     Tenderness: There is no abdominal tenderness. There is no guarding or rebound.     Hernia: No hernia is present.  Musculoskeletal: Normal range of motion.  Lymphadenopathy:     Cervical: No cervical adenopathy.  Skin:    General: Skin is warm and dry.     Coloration: Skin is not jaundiced.     Findings: No rash.  Neurological:     Mental Status: She is alert and oriented to person, place, and time.     Gait: Gait normal.  Psychiatric:        Mood and Affect: Mood normal.        Behavior: Behavior normal.        Thought Content: Thought content normal.        Judgment: Judgment normal.     UC Treatments / Results  Labs (all labs ordered are listed, but only abnormal results are displayed) Labs  Reviewed  GROUP A STREP BY PCR  SARS CORONAVIRUS 2 (TAT 6-24 HRS)    EKG   Radiology No results found.  Procedures Procedures (including critical care time)  Medications Ordered in UC Medications - No data to display  Initial Impression / Assessment and Plan / UC Course  I have reviewed the triage vital signs and the nursing notes. Pertinent labs  results that were available during my care of the patient were reviewed by me and considered in my medical decision making (see chart for details). Has viral URI. She was advised to continue with her  current inhalers. I gave her rx for Tussionex as noted.  Covid test is pending and she will be called if positive  See instructions  Final Clinical Impressions(s) / UC Diagnoses   Final diagnoses:  None   Discharge Instructions   None    ED Prescriptions    None     PDMP not reviewed this encounter.   Shelby Mattocks, [place] 08/31/20 2353

## 2020-08-31 NOTE — Discharge Instructions (Addendum)
Watch this video who to do saline nose rinses.  [email or web address]   Do not use tap water, only boiled water that has been cooled down.  Do it twice a day  for 5-7 days, but avoid bed time.  Check on mychart for your covid results. We always call when positive.   You may take any over the counter medications for cold  If your Covid test ends up positive you may take the following supplements to help your immune system be stronger to fight this viral infection Take Quarcetin 500 mg three times a day x 7 days with Zinc 50 mg ones a day x 7 days. The quarcetin is an antiviral and anti-inflammatory supplement which helps open the zinc channels in the cell to absorb Zinc. Zinc helps decrease the virus load in your body. Take Melatonin 6-10 mg at bed time which also helps support your immune system.  Also make sure to take Vit D 5,000 IU per day with a fatty meal and Vit C 5000 mg a day until you are completely better. To prevent viral illnesses your vitamin D should be between 60-80. Stay on Vitamin D 2,000  and C  1000 mg the rest of the season.  Don't lay around, keep active and walk as much as you are able to to prevent worsening of your symptoms.  Follow up with your family Dr next week.  If you get short of breath and you are able to check  your oxygen with a pulse oxygen meter, if it gets to 92% or less, you need to go to the hospital to be admitted. If you dont have one, come back here and we will assess you.

## 2020-08-31 NOTE — ED Triage Notes (Signed)
Patient in today c/o cough, sore throat, nasal congestion and body aches x 2 days. Patient denies fever. Patient states she feels mucous in her throat but can't get it out. Patient has had the covid vaccines and booster. Patient has tried OTC Delsym, Zyrtec, Flonase and Nyquil in addition to her prescription inhalers.

## 2020-09-17 MED ORDER — DIAZEPAM 10 MG PO TABS: ORAL_TABLET | ORAL | 0 refills | Status: DC

## 2020-09-17 NOTE — Telephone Encounter (Signed)
10 mg diazepam called to pharmacy to be taken 30 min prior to procedure. Must have a driver. [name] [name] notified.

## 2020-09-22 ENCOUNTER — Encounter (HOSPITAL_BASED_OUTPATIENT_CLINIC_OR_DEPARTMENT_OTHER): Payer: Self-pay | Admitting: Emergency Medicine

## 2020-09-22 ENCOUNTER — Other Ambulatory Visit: Payer: Self-pay

## 2020-09-22 ENCOUNTER — Emergency Department (HOSPITAL_BASED_OUTPATIENT_CLINIC_OR_DEPARTMENT_OTHER): Payer: BC Managed Care – PPO

## 2020-09-22 ENCOUNTER — Emergency Department (HOSPITAL_BASED_OUTPATIENT_CLINIC_OR_DEPARTMENT_OTHER)
Admission: EM | Admit: 2020-09-22 | Discharge: 2020-09-22 | Disposition: A | Discharge status: Home / Self Care | Payer: BC Managed Care – PPO | Attending: Emergency Medicine | Admitting: Emergency Medicine

## 2020-09-22 DIAGNOSIS — R1030 Lower abdominal pain, unspecified: Secondary | ICD-10-CM | POA: Diagnosis not present

## 2020-09-22 DIAGNOSIS — R300 Dysuria: Secondary | ICD-10-CM | POA: Insufficient documentation

## 2020-09-22 DIAGNOSIS — E039 Hypothyroidism, unspecified: Secondary | ICD-10-CM | POA: Insufficient documentation

## 2020-09-22 DIAGNOSIS — R110 Nausea: Secondary | ICD-10-CM | POA: Insufficient documentation

## 2020-09-22 DIAGNOSIS — R109 Unspecified abdominal pain: Secondary | ICD-10-CM | POA: Diagnosis not present

## 2020-09-22 DIAGNOSIS — I10 Essential (primary) hypertension: Secondary | ICD-10-CM | POA: Insufficient documentation

## 2020-09-22 LAB — CBC WITH DIFFERENTIAL/PLATELET
Abs Immature Granulocytes: 0.05 10*3/uL (ref 0.00–0.07)
Basophils Absolute: 0.1 10*3/uL (ref 0.0–0.1)
Basophils Relative: 1 %
Eosinophils Absolute: 0.1 10*3/uL (ref 0.0–0.5)
Eosinophils Relative: 1 %
HCT: 38.8 % (ref 36.0–46.0)
Hemoglobin: 12.4 g/dL (ref 12.0–15.0)
Immature Granulocytes: 1 %
Lymphocytes Relative: 20 %
Lymphs Abs: 1.9 10*3/uL (ref 0.7–4.0)
MCH: 26.6 pg (ref 26.0–34.0)
MCHC: 32 g/dL (ref 30.0–36.0)
MCV: 83.1 fL (ref 80.0–100.0)
Monocytes Absolute: 0.7 10*3/uL (ref 0.1–1.0)
Monocytes Relative: 8 %
Neutro Abs: 6.5 10*3/uL (ref 1.7–7.7)
Neutrophils Relative %: 69 %
Platelets: 363 10*3/uL (ref 150–400)
RBC: 4.67 MIL/uL (ref 3.87–5.11)
RDW: 14.3 % (ref 11.5–15.5)
WBC: 9.4 10*3/uL (ref 4.0–10.5)
nRBC: 0 % (ref 0.0–0.2)

## 2020-09-22 LAB — COMPREHENSIVE METABOLIC PANEL
ALT: 15 U/L (ref 0–44)
AST: 15 U/L (ref 15–41)
Albumin: 4.5 g/dL (ref 3.5–5.0)
Alkaline Phosphatase: 149 U/L — ABNORMAL HIGH (ref 38–126)
Anion gap: 9 (ref 5–15)
BUN: 17 mg/dL (ref 6–20)
CO2: 25 mmol/L (ref 22–32)
Calcium: 9.3 mg/dL (ref 8.9–10.3)
Chloride: 103 mmol/L (ref 98–111)
Creatinine, Ser: 0.68 mg/dL (ref 0.44–1.00)
GFR, Estimated: >60 mL/min (ref >60)
Glucose, Bld: 84 mg/dL (ref 70–99)
Potassium: 4.3 mmol/L (ref 3.5–5.1)
Sodium: 137 mmol/L (ref 135–145)
Total Bilirubin: 0.3 mg/dL (ref 0.3–1.2)
Total Protein: 7.5 g/dL (ref 6.5–8.1)

## 2020-09-22 LAB — URINALYSIS, ROUTINE W REFLEX MICROSCOPIC
Bilirubin Urine: NEGATIVE
Glucose, UA: NEGATIVE mg/dL
Hgb urine dipstick: NEGATIVE
Ketones, ur: NEGATIVE mg/dL
Leukocytes,Ua: NEGATIVE
Nitrite: NEGATIVE
Specific Gravity, Urine: 1.039 — ABNORMAL HIGH (ref 1.005–1.030)
pH: 6.5 (ref 5.0–8.0)

## 2020-09-22 LAB — LIPASE, BLOOD: Lipase: 27 U/L (ref 11–51)

## 2020-09-22 LAB — PREGNANCY, URINE: Preg Test, Ur: NEGATIVE

## 2020-09-22 IMAGING — CT CT RENAL STONE PROTOCOL
1 of 2 series · 13 of 32 positions shown, 18 images · non-contrast
Comparison: [DATE]

CLINICAL DATA: Flank pain

EXAM:
CT ABDOMEN AND PELVIS WITHOUT CONTRAST
TECHNIQUE: Multidetector CT imaging of the abdomen and pelvis was performed
following the standard protocol without oral or IV contrast.

[Series 2: stone full · axial · 0.82mm/px · z∈[+655,+1075]mm · 13 of 98 slices shown, 18 images]
[im 7/98  soft-tissue]
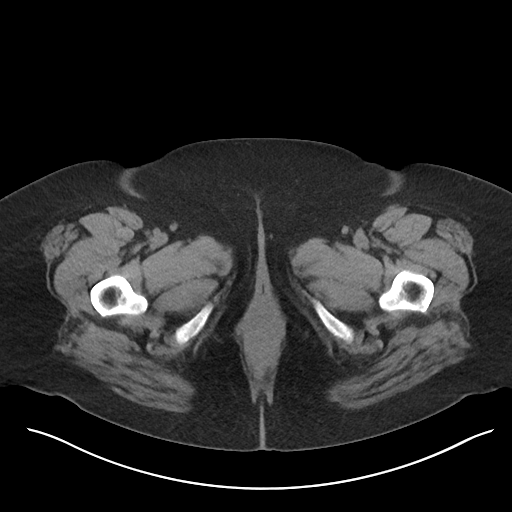
[im 7/98  bone]
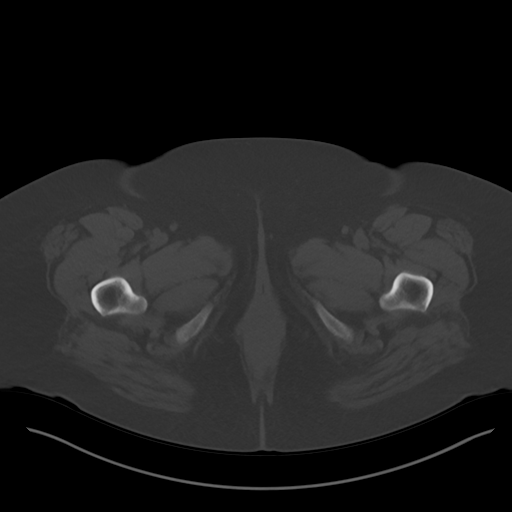
[im 13/98  soft-tissue]
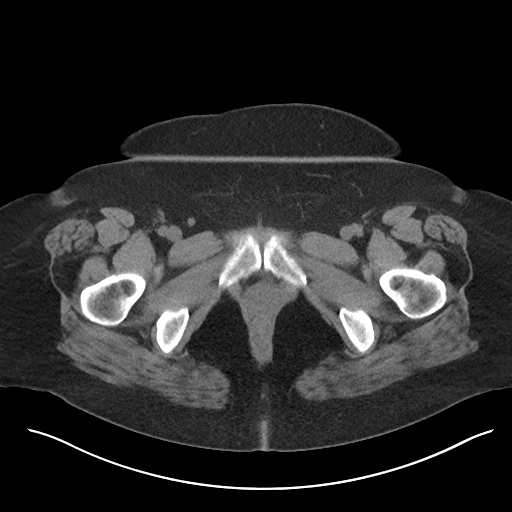
[im 20/98  soft-tissue]
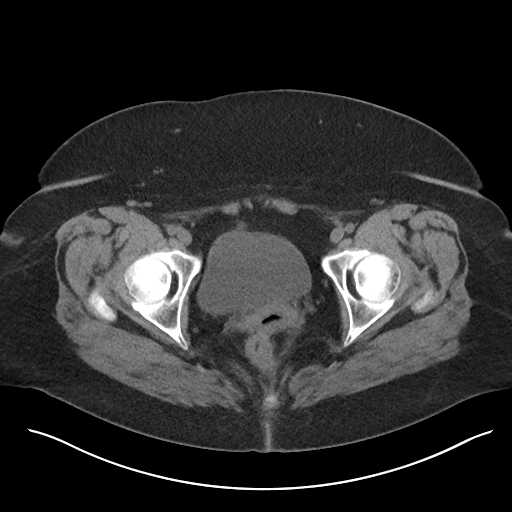
[im 33/98  soft-tissue]
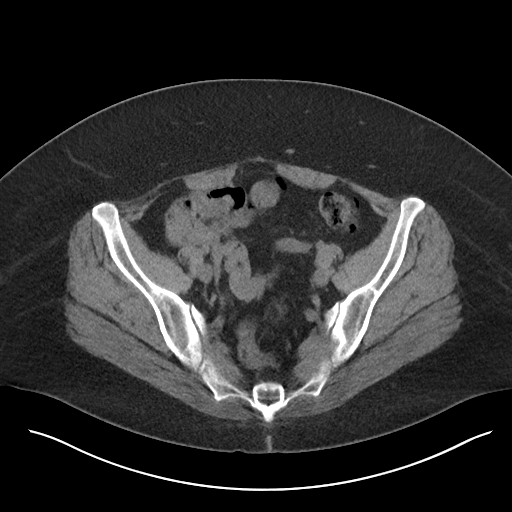
[im 39/98  soft-tissue]
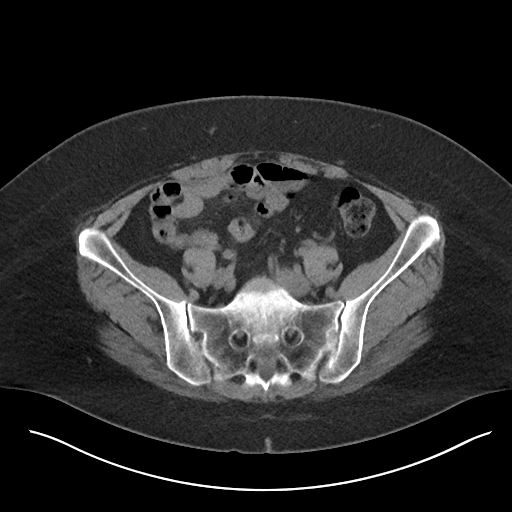
[im 46/98  soft-tissue]
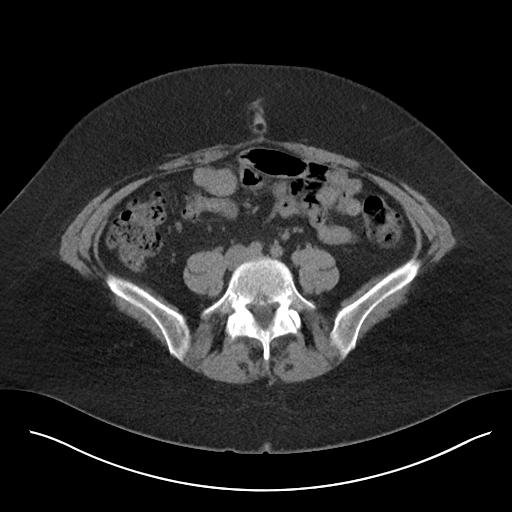
[im 52/98  soft-tissue]
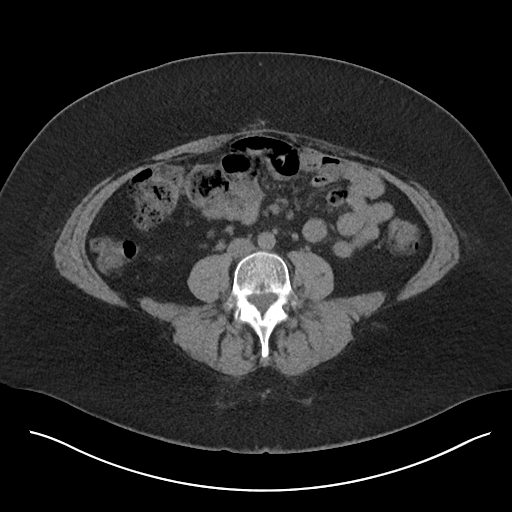
[im 59/98  soft-tissue]
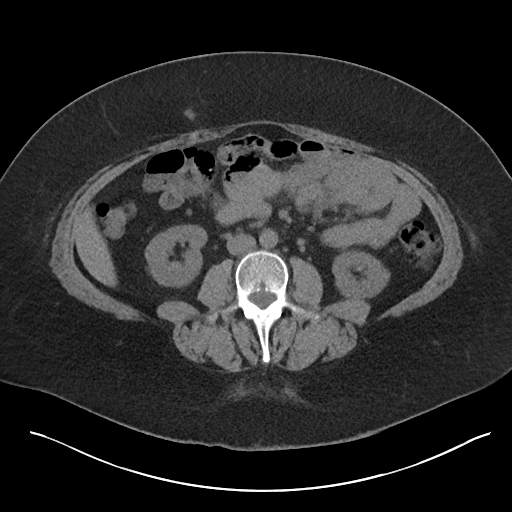
[im 65/98  soft-tissue]
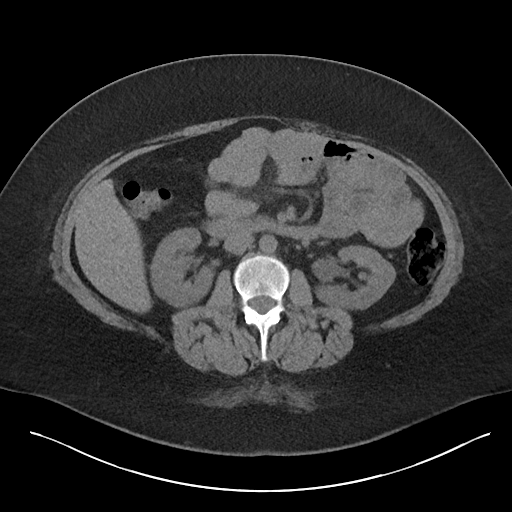
[im 65/98  bone]
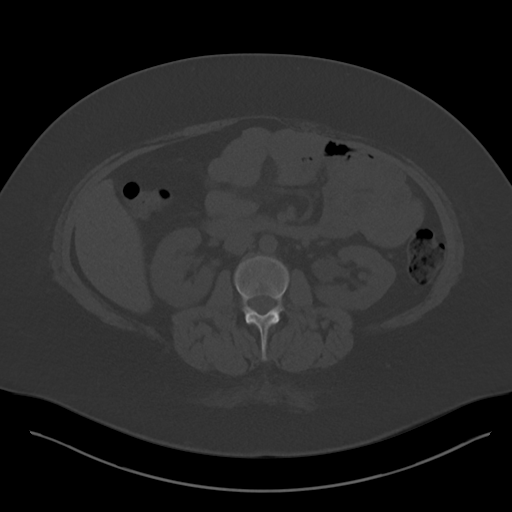
[im 72/98  lung]
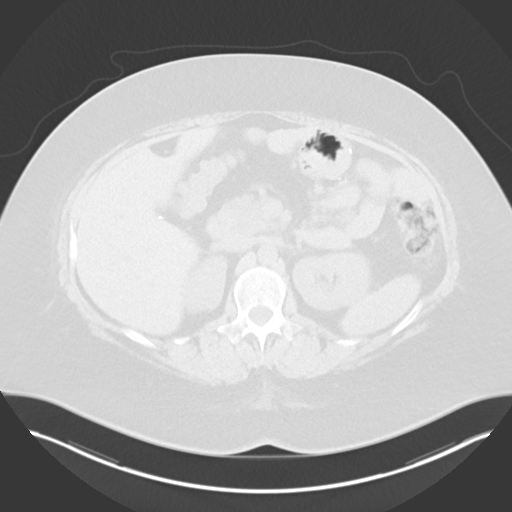
[im 78/98  soft-tissue]
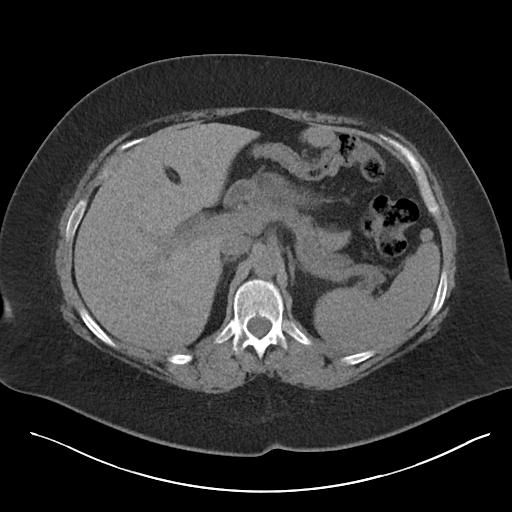
[im 78/98  lung]
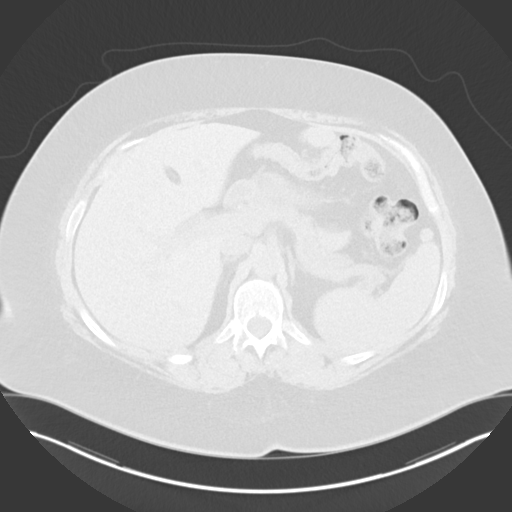
[im 85/98  soft-tissue]
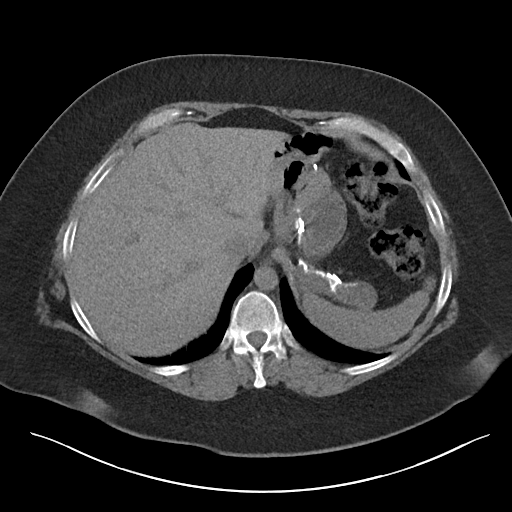
[im 85/98  lung]
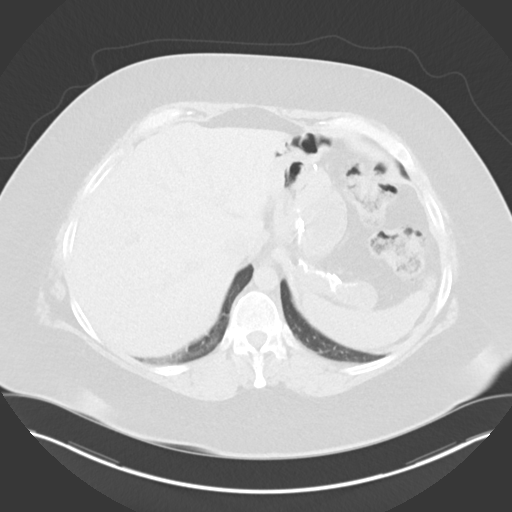
[im 91/98  soft-tissue]
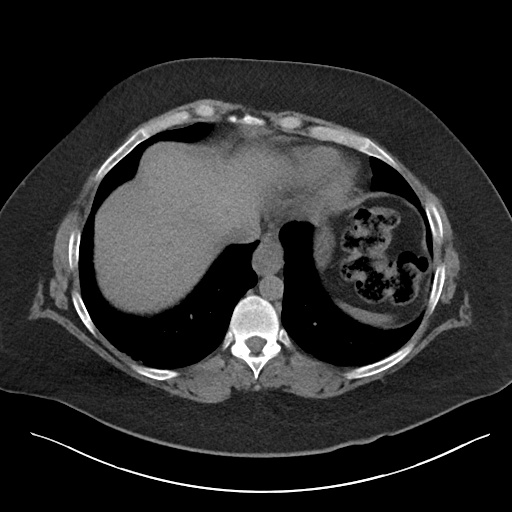
[im 91/98  lung]
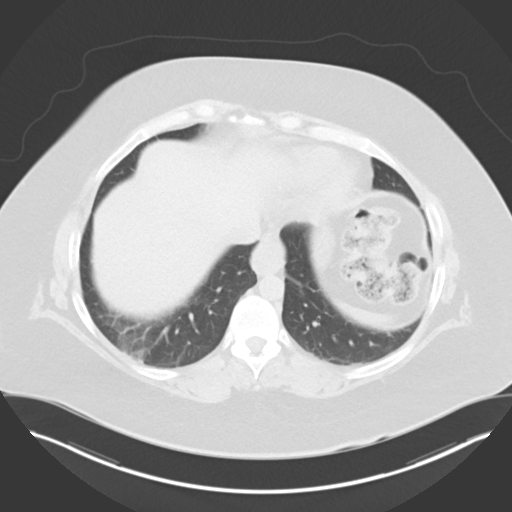

[13 of 32 positions shown; findings below may reference images not displayed]

FINDINGS: Lower chest: There is slight bibasilar lung atelectasis. Lung bases
otherwise are clear.

Hepatobiliary: No focal liver lesions are appreciable on this
noncontrast enhanced study. Gallbladder is absent. No appreciable
intrahepatic or extrahepatic biliary duct dilatation.

Pancreas: There is no evident pancreatic mass or inflammatory focus.

Spleen: No splenic lesions are evident.

Adrenals/Urinary Tract: Adrenals bilaterally appear unremarkable.
There is no evident renal or ureteral calculus on either side. There
is an extrarenal pelvis on the left, an anatomic variant. There is
no evident renal or ureteral calculus on either side. Urinary
bladder is midline with wall thickness within normal limits.

Stomach/Bowel: Patient has had Roux-en-Y type gastric bypass
procedure, unchanged in appearance compared to prior study. No bowel
wall thickening or fluid in postoperative areas. Elsewhere, there is
no appreciable bowel wall or mesenteric thickening. No evident bowel
obstruction. Terminal ileum appears unremarkable. Appendix appears
unremarkable. No evident free air or portal venous air.

Vascular/Lymphatic: No abdominal aortic aneurysm. No vascular
lesions evident on this noncontrast enhanced study. There is no
evident adenopathy in the abdomen or pelvis.

Reproductive: The uterus is anteverted. Suspected dominant follicle
right ovary measuring 2.6 x 2.5 cm. No other evident adnexal mass.

Other: No evident abscess or ascites in the abdomen or pelvis.

Musculoskeletal: No blastic or lytic bone lesions. Small bone island
in the medial left iliac bone at the level of the sacroiliac joint
on the left. No intramuscular lesions evident.
IMPRESSION: 1. Status post Roux-en-Y type anastomosis without complicating
features. No bowel wall thickening. No bowel obstruction. Appendix
appears normal.

2.  No evident abscess in the abdomen or pelvis.

3. No renal or ureteral calculus evident. No hydronephrosis. Urinary
bladder wall thickness normal.

1. Gallbladder absent.

## 2020-09-22 MED ORDER — MORPHINE SULFATE (PF) 4 MG/ML IV SOLN
4.0000 mg | Freq: Once | INTRAVENOUS | Status: AC
  Administered 2020-09-22: 4 mg via INTRAVENOUS
  Filled 2020-09-22: qty 1

## 2020-09-22 MED ORDER — HYDROCODONE-ACETAMINOPHEN 5-325 MG PO TABS: 1.0000 | ORAL_TABLET | Freq: Four times a day (QID) | ORAL | 0 refills | Status: DC | PRN

## 2020-09-22 MED ORDER — KETOROLAC TROMETHAMINE 30 MG/ML IJ SOLN
30.0000 mg | Freq: Once | INTRAMUSCULAR | Status: AC
  Administered 2020-09-22: 30 mg via INTRAVENOUS
  Filled 2020-09-22: qty 1

## 2020-09-22 MED ORDER — ONDANSETRON HCL 4 MG/2ML IJ SOLN
4.0000 mg | Freq: Once | INTRAMUSCULAR | Status: AC
  Administered 2020-09-22: 4 mg via INTRAVENOUS
  Filled 2020-09-22: qty 2

## 2020-09-22 MED ORDER — ONDANSETRON HCL 4 MG PO TABS: 4.0000 mg | ORAL_TABLET | Freq: Four times a day (QID) | ORAL | 0 refills | Status: DC

## 2020-09-22 MED ORDER — SODIUM CHLORIDE 0.9 % IV BOLUS
1000.0000 mL | Freq: Once | INTRAVENOUS | Status: AC
  Administered 2020-09-22: 1000 mL via INTRAVENOUS

## 2020-09-22 NOTE — ED Triage Notes (Signed)
Pt has been experiencing left flank pain for the past 4 days. Pain radiates from the left side to lower abdomen. Pt has nausea but no emesis.  Pt states that it burns when she urinates.

## 2020-09-22 NOTE — ED Provider Notes (Signed)
[facility] EMERGENCY DEPT Provider Note   CSN: [id number] Arrival date & time: 09/22/20  1133     History Chief Complaint  Patient presents with  . Flank Pain    [name] [name] is a 35 y.o. female.  Pt presents to the ED today with left sided flank pain.  She said she has some pain radiating into her lower abdomen.  She also has some dysuria and nausea.  She has had kidney stones and kidney infections.          Past Medical History:  Diagnosis Date  . ADD (attention deficit disorder)   . Anemia   . Anxiety   . B12 deficiency   . Back pain   . Bilateral swelling of feet   . Bipolar 2 disorder (HCC) 09/19/2019  . Constipation   . DEPRESSION   . Elevated cholesterol   . Family history of colon cancer 06/07/2019   Father, 41s; deceased.   . Fatty liver   . Fibromyalgia   . Gallbladder problem   . GERD (gastroesophageal reflux disease)   . Heartburn   . History of stomach ulcers   . Hypertension   . Hypothyroidism    Hx of, normalized TSH during pregnancy 2011  . Infertility, female   . Lactose intolerance   . Migraines   . Morbid obesity (HCC)    s/p RY 08/2012 - start weight 290 pounds  . Nephrolithiasis   . Palpitations   . Panic disorder   . PCOS (polycystic ovarian syndrome)   . PONV (postoperative nausea and vomiting)   . Pregnancy induced hypertension   . Shortness of breath   . Vitamin D deficiency     Patient Active Problem List   Diagnosis Date Noted  . Abdominal pain 05/03/2020  . Dehydration 05/03/2020  . Nausea & vomiting 05/03/2020  . 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  . Plantar fasciitis 10/31/2019  . Bipolar 2 disorder (HCC) 09/19/2019  . Fibromyalgia 09/09/2019  . Ischial bursitis of left side 07/19/2019  . Vitamin B12 deficiency 06/01/2019  . Vitamin D deficiency 06/01/2019  . Panic disorder 06/01/2019  . Family history of colon cancer June 07, 2019  . Chronic constipation 07-Jun-2019  . Major  depression, recurrent, chronic (HCC) 07-Jun-2019  . Insomnia due to other mental disorder 03/13/2019  . Pain in both lower extremities 11/21/2018  . Nocturnal leg cramps 10/26/2018  . Nephrolithiasis   . Reduced libido, due to Lexapro 07/09/2017  . Binge eating disorder 04/23/2017  . Morbid obesity (HCC) 04/23/2017  . Acquired iron deficiency anemia due to decreased absorption 02/22/2015  . 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 Quintella Reichert OSTEOTOMY Left 06/24/2017   Procedure: Ralene Bathe;  Surgeon: [name], DPM;  Location: [facility];  Service: Podiatry;  Laterality: Left;  . BUNIONECTOMY Left 06/24/2017   Procedure: Anthoney Harada;  Surgeon: [name], DPM;  Location: [facility];  Service: Podiatry;  Laterality: Left;  . CESAREAN SECTION     x 2  . CHOLECYSTECTOMY N/A 02/15/2019   Procedure: LAPAROSCOPIC CHOLECYSTECTOMY WITH INTRAOPERATIVE CHOLANGIOGRAM;  Surgeon: Andria Meuse, MD;  Location: [facility];  Service: General;  Laterality: N/A;  . COLON RESECTION N/A 05/04/2020   Procedure: DIAGNOSTIC LAPAROSCOPY, RESECTION OF CANDYCANE, UPPER ENDOSCOPY;  Surgeon: Berna Bue, MD;  Location: [facility];  Service: General;  Laterality: N/A;  . FOOT SURGERY    .  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   Procedure: LAPAROSCOPIC ROUX-EN-Y GASTRIC;  Surgeon: Atilano Ina, MD;  Location: [facility];  Service: General;  Laterality: N/A;  laparoscopic roux-en-y gastric bypass  . LITHOTRIPSY Left   . TONSILLECTOMY    . TUBAL LIGATION    . UPPER GI ENDOSCOPY  08/24/2012   Procedure: UPPER GI ENDOSCOPY;  Surgeon: Atilano Ina, MD;  Location: [facility];  Service: General;;  . WISDOM TOOTH EXTRACTION       OB History    Gravida  2   Para  2   Term  2   Preterm  0   AB  0   Living  2     SAB  0   IAB  0   Ectopic  0   Multiple  0   Live Births  2            Family History  Problem Relation Age of Onset  . Hyperlipidemia Father   . Hypertension Father   . Kidney disease Father   . Colon cancer Father 63  . Cancer Father   . Depression Father   . Anxiety disorder Father   . Bipolar disorder Father   . Sleep apnea Father   . Obesity Father   . Hypertension Mother   . Depression Mother   . Anxiety disorder Mother   . Obesity Mother   . Hypertension Maternal Grandmother   . Uterine cancer Maternal Grandmother 74  . Diabetes Maternal Grandfather     Social History   Tobacco Use  . Smoking status: Never Smoker  . Smokeless tobacco: Never Used  Vaping Use  . Vaping Use: Never used  Substance Use Topics  . Alcohol use: Yes    Comment: rare  . Drug use: No    Home Medications Prior to Admission medications   Medication Sig Start Date End Date Taking? Authorizing Provider  acetaminophen (TYLENOL) 500 MG tablet Take 2 tablets (1,000 mg total) by mouth every 6 (six) hours as needed for moderate pain. 02/13/19  Yes Fayrene Helper, PA-C  albuterol (VENTOLIN HFA) 108 (90 Base) MCG/ACT inhaler Inhale 2 puffs into the lungs every 4 (four) hours as needed for wheezing or shortness of breath.  02/21/20  Yes [provider]  baclofen (LIORESAL) 10 MG tablet Take 1 tablet (10 mg total) by mouth 3 (three) times daily as needed for muscle spasms. 06/29/20  Yes Rodolph Bong, MD  beclomethasone (QVAR REDIHALER) 40 MCG/ACT inhaler Inhale 1 puff into the lungs 2 (two) times daily. 06/06/20  Yes Jarold Motto, PA  busPIRone (BUSPAR) 15 MG tablet Take 15 mg by mouth 3 (three) times daily. 11/01/19  Yes [provider]  CALCIUM PO Take 1 tablet by mouth daily.   Yes [provider]  clonazePAM (KLONOPIN) 1 MG tablet Take 0.5-1 tablets (0.5-1 mg total) by mouth 2 (two) times daily as needed for anxiety. 08/01/20  Yes Willow Ora, MD  cyanocobalamin (,VITAMIN B-12,) 1000 MCG/ML injection Inject 1 vial per week for 3 weeks,  then 1 vial per month thereafter 06/21/20  Yes Willow Ora, MD  diazepam (VALIUM) 10 MG tablet Take one 10 mg tablet po 30 min prior to procedure.  Must have a driver. 09/17/20  Yes Kirsteins, Victorino Sparrow, MD  diclofenac Sodium (VOLTAREN) 1 % GEL Apply 4 g topically 4 (four) times daily. Patient taking differently: Apply 4 g topically 4 (four) times daily as needed (pain). 05/13/19  Yes Palumbo,  April, MD  docusate sodium (COLACE) 100 MG capsule Take 1 capsule (100 mg total) by mouth 2 (two) times daily. 05/06/20  Yes Emelia Loron, MD  HYDROcodone-acetaminophen (NORCO/VICODIN) 5-325 MG tablet Take 1 tablet by mouth every 6 (six) hours as needed. 09/22/20  Yes Jacalyn Lefevre, MD  hydrOXYzine (VISTARIL) 50 MG capsule Take 50 mg by mouth in the morning, at noon, and at bedtime.  08/24/19  Yes [provider]  lamoTRIgine (LAMICTAL) 150 MG tablet Take 300 mg by mouth daily.  04/10/20  Yes [provider]  lithium carbonate (LITHOBID) 300 MG CR tablet Take 600 mg by mouth at bedtime.   Yes [provider]  Magnesium 500 MG TABS Take 1 tablet by mouth daily.   Yes [provider]  metFORMIN (GLUCOPHAGE) 500 MG tablet Take 500 mg by mouth 2 (two) times daily. 11/01/19  Yes [provider]  ondansetron (ZOFRAN ODT) 4 MG disintegrating tablet 4mg  ODT q4 hours prn nausea/vomit Patient taking differently: Take 4 mg by mouth every hour as needed for nausea or vomiting. 10/13/19  Yes 10/15/19, PA-C  ondansetron (ZOFRAN) 4 MG tablet Take 1 tablet (4 mg total) by mouth every 6 (six) hours. 09/22/20  Yes 11/22/20, MD  pregabalin (LYRICA) 200 MG capsule Take 1 capsule (200 mg total) by mouth 2 (two) times daily. 08/30/20  Yes Kirsteins, 09/01/20, MD  QUEtiapine (SEROQUEL) 400 MG tablet Take 400 mg by mouth at bedtime.  08/24/19  Yes [provider]  QUEtiapine (SEROQUEL) 50 MG tablet Take by mouth. 06/27/20  Yes [provider]  tizanidine  (ZANAFLEX) 2 MG capsule TAKE 2 CAPSULES (4 MG TOTAL) BY MOUTH 3 (THREE) TIMES DAILY AS NEEDED FOR MUSCLE SPASMS. 06/21/20  Yes 08/19/20, MD  valACYclovir (VALTREX) 1000 MG tablet Take 1 tablet (1,000 mg total) by mouth 3 (three) times daily. Patient taking differently: Take 1,000 mg by mouth 3 (three) times daily as needed (cold sores). 05/25/19  Yes Martin, Mary-Margaret, FNP  Vitamin D, Ergocalciferol, (DRISDOL) 1.25 MG (50000 UT) CAPS capsule Take 50,000 Units by mouth once a week. Fridays   Yes [provider]  chlorpheniramine-HYDROcodone (TUSSIONEX PENNKINETIC ER) 10-8 MG/5ML SUER Take 5 mLs by mouth every 12 (twelve) hours as needed for cough. 08/31/20   Rodriguez-Southworth, 09/02/20, PA-C  SYRINGE-NEEDLE, DISP, 3 ML 25G X 1" 3 ML MISC Use 1 needle per application once weekly 06/21/20   08/19/20, MD    Allergies    Nsaids, Other, and Zolpidem  Review of Systems   Review of Systems  Gastrointestinal: Positive for nausea.  Genitourinary: Positive for dysuria and flank pain.  All other systems reviewed and are negative.   Physical Exam Updated Vital Signs BP 128/89   Pulse 80   Temp 98.7 F (37.1 C) (Oral)   Resp 18   Ht 5\' 4"  (1.626 m)   Wt 120.2 kg   SpO2 100%   BMI 45.49 kg/m   Physical Exam Vitals and nursing note reviewed.  Constitutional:      Appearance: Normal appearance. She is obese.  HENT:     Head: Normocephalic and atraumatic.     Right Ear: External ear normal.     Left Ear: External ear normal.     Nose: Nose normal.     Mouth/Throat:     Mouth: Mucous membranes are moist.     Pharynx: Oropharynx is clear.  Eyes:     Extraocular Movements: Extraocular movements  intact.     Conjunctiva/sclera: Conjunctivae normal.     Pupils: Pupils are equal, round, and reactive to light.  Cardiovascular:     Rate and Rhythm: Normal rate and regular rhythm.     Pulses: Normal pulses.     Heart sounds: Normal heart sounds.  Pulmonary:     Effort:  Pulmonary effort is normal.     Breath sounds: Normal breath sounds.  Abdominal:     General: Abdomen is flat. Bowel sounds are normal.     Palpations: Abdomen is soft.  Musculoskeletal:        General: Normal range of motion.     Cervical back: Normal range of motion and neck supple.  Skin:    General: Skin is warm.     Capillary Refill: Capillary refill takes less than 2 seconds.  Neurological:     General: No focal deficit present.     Mental Status: She is alert and oriented to person, place, and time.  Psychiatric:        Mood and Affect: Mood normal.        Behavior: Behavior normal.        Thought Content: Thought content normal.        Judgment: Judgment normal.     ED Results / Procedures / Treatments   Labs (all labs ordered are listed, but only abnormal results are displayed) Labs Reviewed  URINALYSIS, ROUTINE W REFLEX MICROSCOPIC - Abnormal; Notable for the following components:      Result Value   Specific Gravity, Urine 1.039 (*)    Protein, ur TRACE (*)    All other components within normal limits  COMPREHENSIVE METABOLIC PANEL - Abnormal; Notable for the following components:   Alkaline Phosphatase 149 (*)    All other components within normal limits  PREGNANCY, URINE  CBC WITH DIFFERENTIAL/PLATELET  LIPASE, BLOOD    EKG None  Radiology CT Renal Stone Study  Result Date: 09/22/2020 CLINICAL DATA:  Flank pain EXAM: CT ABDOMEN AND PELVIS WITHOUT CONTRAST TECHNIQUE: Multidetector CT imaging of the abdomen and pelvis was performed following the standard protocol without oral or IV contrast. COMPARISON:  May 25, 2020 FINDINGS: Lower chest: There is slight bibasilar lung atelectasis. Lung bases otherwise are clear. Hepatobiliary: No focal liver lesions are appreciable on this noncontrast enhanced study. Gallbladder is absent. No appreciable intrahepatic or extrahepatic biliary duct dilatation. Pancreas: There is no evident pancreatic mass or inflammatory  focus. Spleen: No splenic lesions are evident. Adrenals/Urinary Tract: Adrenals bilaterally appear unremarkable. There is no evident renal or ureteral calculus on either side. There is an extrarenal pelvis on the left, an anatomic variant. There is no evident renal or ureteral calculus on either side. Urinary bladder is midline with wall thickness within normal limits. Stomach/Bowel: Patient has had Roux-en-Y type gastric bypass procedure, unchanged in appearance compared to prior study. No bowel wall thickening or fluid in postoperative areas. Elsewhere, there is no appreciable bowel wall or mesenteric thickening. No evident bowel obstruction. Terminal ileum appears unremarkable. Appendix appears unremarkable. No evident free air or portal venous air. Vascular/Lymphatic: No abdominal aortic aneurysm. No vascular lesions evident on this noncontrast enhanced study. There is no evident adenopathy in the abdomen or pelvis. Reproductive: The uterus is anteverted. Suspected dominant follicle right ovary measuring 2.6 x 2.5 cm. No other evident adnexal mass. Other: No evident abscess or ascites in the abdomen or pelvis. Musculoskeletal: No blastic or lytic bone lesions. Small bone island in the medial left iliac  bone at the level of the sacroiliac joint on the left. No intramuscular lesions evident. IMPRESSION: 1. Status post Roux-en-Y type anastomosis without complicating features. No bowel wall thickening. No bowel obstruction. Appendix appears normal. 2.  No evident abscess in the abdomen or pelvis. 3. No renal or ureteral calculus evident. No hydronephrosis. Urinary bladder wall thickness normal. 1. Gallbladder absent. Electronically Signed   By: Bretta BangWilliam  Woodruff III M.D.   On: 09/22/2020 15:16    Procedures Procedures   Medications Ordered in ED Medications  morphine 4 MG/ML injection 4 mg (4 mg Intravenous Given 09/22/20 1336)  sodium chloride 0.9 % bolus 1,000 mL (1,000 mLs Intravenous New Bag/Given 09/22/20  1332)  ondansetron (ZOFRAN) injection 4 mg (4 mg Intravenous Given 09/22/20 1333)  ketorolac (TORADOL) 30 MG/ML injection 30 mg (30 mg Intravenous Given 09/22/20 1415)  morphine 4 MG/ML injection 4 mg (4 mg Intravenous Given 09/22/20 1503)    ED Course  I have reviewed the triage vital signs and the nursing notes.  Pertinent labs & imaging results that were available during my care of the patient were reviewed by me and considered in my medical decision making (see chart for details).    MDM Rules/Calculators/A&P                          Labs and urine neg.  Pt still with pain, so I ordered a CT renal.  This was also negative.  Pt may have passed a stone, but she also just may have msk pain.  Pt is instructed to return if worse.  F/u with pcp. Final Clinical Impression(s) / ED Diagnoses Final diagnoses:  Flank pain    Rx / DC Orders ED Discharge Orders         Ordered    HYDROcodone-acetaminophen (NORCO/VICODIN) 5-325 MG tablet  Every 6 hours PRN        09/22/20 1524    ondansetron (ZOFRAN) 4 MG tablet  Every 6 hours        09/22/20 1524           Jacalyn LefevreHaviland, Jessen Siegman, MD 09/22/20 1525

## 2020-09-25 DIAGNOSIS — F3181 Bipolar II disorder: Secondary | ICD-10-CM | POA: Diagnosis not present

## 2020-09-25 DIAGNOSIS — F1910 Other psychoactive substance abuse, uncomplicated: Secondary | ICD-10-CM | POA: Diagnosis not present

## 2020-10-02 ENCOUNTER — Encounter: Payer: Self-pay | Admitting: Physical Medicine & Rehabilitation

## 2020-10-02 ENCOUNTER — Other Ambulatory Visit: Payer: Self-pay | Admitting: Physical Medicine & Rehabilitation

## 2020-10-02 ENCOUNTER — Other Ambulatory Visit: Payer: Self-pay

## 2020-10-02 ENCOUNTER — Encounter
Payer: BC Managed Care – PPO | Attending: Physical Medicine & Rehabilitation | Admitting: Physical Medicine & Rehabilitation

## 2020-10-02 VITALS — BP 100/70 | HR 71 | Temp 98.3°F | Ht 64.0 in | Wt 272.0 lb

## 2020-10-02 DIAGNOSIS — M47816 Spondylosis without myelopathy or radiculopathy, lumbar region: Secondary | ICD-10-CM | POA: Insufficient documentation

## 2020-10-02 NOTE — Progress Notes (Addendum)
  PROCEDURE RECORD [facility]   Name: [name] [name] DOB:July 17, 1985 MRN: [id number]  Date:10/02/2020  Physician: Claudette Laws, MD    Nurse/CMA: Mishon Blubaugh, CMA   Allergies:  Allergies  Allergen Reactions  . Nsaids Other (See Comments)    Gastric bypass "gastric bypass"  . Other Other (See Comments)    "sleep walking"  . Zolpidem Other (See Comments)    Sleep walking  Sleep walking     Consent Signed: Yes.    Is patient diabetic? No.  CBG today?   Pregnant: No. LMP: No LMP recorded. Patient has had an ablation. (age 43-55)  Anticoagulants: no Anti-inflammatory: no Antibiotics: no  Procedure: right lumbar 3,4,5 radiofrequency  Position: Prone Start Time: 9:45am      End Time: 9:57am  Fluoro Time: 35s  RN/CMA Ari Bernabei, CMA Lylianna Fraiser, CMA    Time 9:31am 10:03am    BP 100/70 94/65    Pulse 71 76    Respirations 16 16    O2 Sat 98 98    S/S 6 6    Pain Level 7/10 8/10     D/C home with step mom, patient A & O X 3, D/C instructions reviewed, and sits independently.

## 2020-10-02 NOTE — Patient Instructions (Signed)
You had a radio frequency procedure today This was done to alleviate joint pain in your lumbar area We injected lidocaine which is a local anesthetic.  You may experience soreness at the injection sites. You may also experienced some irritation of the nerves that were heated I'm recommending ice for 30 minutes every 2 hours as needed for the next 24-48 hours   

## 2020-10-02 NOTE — Progress Notes (Signed)
RightL5 dorsal ramus., Right L4 and Right L3 medial branch radio frequency neurotomy under fluoroscopic guidance   Indication: Low back pain due to lumbar spondylosis which has been relieved on 2 occasions by greater than 50% by lumbar medial branch blocks at corresponding levels.  Informed consent was obtained after describing risks and benefits of the procedure with the patient, this includes bleeding, bruising, infection, paralysis and medication side effects. The patient wishes to proceed and has given written consent. The patient was placed in a prone position. The lumbar and sacral area was marked and prepped with Betadine. A 25-gauge 1-1/2 inch needle was inserted into the skin and subcutaneous tissue at 3 sites in one ML of 1% lidocaine was injected into each site. Then a 18-gauge 15 cm radio frequency needle with a 1 cm curved active tip was inserted targeting the Right S1 SAP/sacral ala junction. Bone contact was made and confirmed with lateral imaging.  motor stimulation at 2 Hz confirm proper needle location followed by injection of 1ml 2% MPF lidocaine. Then the Right L5 SAP/transverse process junction was targeted. Bone contact was made and confirmed with lateral imaging.  motor stimulation at 2 Hz confirm proper needle location followed by injection of 1ml 2% MPF lidocaine. Then the Right L4 SAP/transverse process junction was targeted. Bone contact was made and confirmed with lateral imaging. motor stimulation at 2 Hz confirm proper needle location followed by injection of 1ml 2% MPF lidocaine. Radio frequency lesion being at 80C for 90 seconds was performed. Needles were removed. Post procedure instructions and vital signs were performed. Patient tolerated procedure well. Followup appointment was given. 

## 2020-10-04 ENCOUNTER — Telehealth: Payer: BC Managed Care – PPO | Admitting: Orthopedic Surgery

## 2020-10-04 ENCOUNTER — Encounter: Payer: Self-pay | Admitting: Orthopedic Surgery

## 2020-10-04 DIAGNOSIS — R059 Cough, unspecified: Secondary | ICD-10-CM

## 2020-10-04 MED ORDER — PREDNISONE 20 MG PO TABS: 40.0000 mg | ORAL_TABLET | Freq: Every day | ORAL | 0 refills | Status: AC

## 2020-10-04 NOTE — Progress Notes (Signed)
Acute Office Visit  Subjective:    Patient ID: [name] [name], female    DOB: [birth year], 35 y.o.   MRN: [id number]  No chief complaint on file.   HPI Patient is in today for cough as part of sinusitis. It is keeping her up at night, is quite severe, and has altered her voice. She's also having some SOB along with it. OTC decongestants and inhalers prescribed by her PCP are helping and she started a Z-Pack today but would like some Tussionex as well (she's had this prescribed before). She says that [employer] don't really work for her. She is on spring break and does not need a work excuse.  Past Medical History:  Diagnosis Date  . ADD (attention deficit disorder)   . Anemia   . Anxiety   . B12 deficiency   . Back pain   . Bilateral swelling of feet   . Bipolar 2 disorder ([place]) 09/19/2019  . Constipation   . DEPRESSION   . Elevated cholesterol   . Family history of colon cancer 2019/05/24   Father, 58s; deceased.   . Fatty liver   . Fibromyalgia   . Gallbladder problem   . GERD (gastroesophageal reflux disease)   . Heartburn   . History of stomach ulcers   . Hypertension   . Hypothyroidism    Hx of, normalized TSH during pregnancy 2011  . Infertility, female   . Lactose intolerance   . Migraines   . Morbid obesity ([place])    s/p RY 08/2012 - start weight 290 pounds  . Nephrolithiasis   . Palpitations   . Panic disorder   . PCOS (polycystic ovarian syndrome)   . PONV (postoperative nausea and vomiting)   . Pregnancy induced hypertension   . Shortness of breath   . Vitamin D deficiency     Past Surgical History:  Procedure Laterality Date  . Barbie Banner OSTEOTOMY Left 06/24/2017   Procedure: [employer];  Surgeon: Trula Slade, DPM;  Location: [facility];  Service: Podiatry;  Laterality: Left;  . BUNIONECTOMY Left 06/24/2017   Procedure: Annye English;  Surgeon: Trula Slade, DPM;  Location: [facility];  Service:  Podiatry;  Laterality: Left;  . CESAREAN SECTION     x 2  . CHOLECYSTECTOMY N/A 02/15/2019   Procedure: LAPAROSCOPIC CHOLECYSTECTOMY WITH INTRAOPERATIVE CHOLANGIOGRAM;  Surgeon: Ileana Roup, MD;  Location[facility];  Service: General;  Laterality: N/A;  . COLON RESECTION N/A 05/04/2020   Procedure: DIAGNOSTIC LAPAROSCOPY, RESECTION OF CANDYCANE, UPPER ENDOSCOPY;  Surgeon: Clovis Riley, MD;  [facility];  Service: General;  Laterality: N/A;  . FOOT SURGERY    . 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   Procedure: LAPAROSCOPIC ROUX-EN-Y GASTRIC;  Surgeon: Gayland Curry, MD;  [facility];  Service: General;  Laterality: N/A;  laparoscopic roux-en-y gastric bypass  . LITHOTRIPSY Left   . TONSILLECTOMY    . TUBAL LIGATION    . UPPER GI ENDOSCOPY  08/24/2012   Procedure: UPPER GI ENDOSCOPY;  Surgeon: Gayland Curry, MD;  [facility];  Service: General;;  . WISDOM TOOTH EXTRACTION      Family History  Problem Relation Age of Onset  . Hyperlipidemia Father   . Hypertension Father   . Kidney disease Father   . Colon cancer Father 53  . Cancer Father   . Depression Father   . Anxiety disorder Father   . Bipolar disorder Father   .  Sleep apnea Father   . Obesity Father   . Hypertension Mother   . Depression Mother   . Anxiety disorder Mother   . Obesity Mother   . Hypertension Maternal Grandmother   . Uterine cancer Maternal Grandmother 76  . Diabetes Maternal Grandfather     Social History   Socioeconomic History  . Marital status: Married    Spouse name: Not on file  . Number of children: Not on file  . Years of education: Not on file  . Highest education level: Not on file  Occupational History  . Occupation: stay at home mom  Tobacco Use  . Smoking status: Never Smoker  . Smokeless tobacco: Never Used  Vaping Use  . Vaping Use: Never used  Substance and Sexual Activity  . Alcohol use: Yes    Comment: rare  . Drug use: No  . Sexual activity: Not on  file    Comment: tubal ligation  Other Topics Concern  . Not on file  Social History Narrative   Married, lives with spouse and dtr born 07/2009. Employed as Occupational psychologist at [employer].   Social Determinants of Health   Financial Resource Strain: Not on file  Food Insecurity: Not on file  Transportation Needs: Not on file  Physical Activity: Not on file  Stress: Not on file  Social Connections: Not on file  Intimate Partner Violence: Not on file    Outpatient Medications Prior to Visit  Medication Sig Dispense Refill  . acetaminophen (TYLENOL) 500 MG tablet Take 2 tablets (1,000 mg total) by mouth every 6 (six) hours as needed for moderate pain. 30 tablet 0  . albuterol (VENTOLIN HFA) 108 (90 Base) MCG/ACT inhaler Inhale 2 puffs into the lungs every 4 (four) hours as needed for wheezing or shortness of breath.     . baclofen (LIORESAL) 10 MG tablet Take 1 tablet (10 mg total) by mouth 3 (three) times daily as needed for muscle spasms. 90 tablet 3  . beclomethasone (QVAR REDIHALER) 40 MCG/ACT inhaler Inhale 1 puff into the lungs 2 (two) times daily. 1 each 4  . busPIRone (BUSPAR) 15 MG tablet Take 15 mg by mouth 3 (three) times daily.    Marland Kitchen CALCIUM PO Take 1 tablet by mouth daily.    . clonazePAM (KLONOPIN) 1 MG tablet Take 0.5-1 tablets (0.5-1 mg total) by mouth 2 (two) times daily as needed for anxiety. 60 tablet 5  . clotrimazole (MYCELEX) 10 MG troche Take 10 mg by mouth 5 (five) times daily.    . cyanocobalamin (,VITAMIN B-12,) 1000 MCG/ML injection Inject 1 vial per week for 3 weeks, then 1 vial per month thereafter 6 mL 3  . diclofenac Sodium (VOLTAREN) 1 % GEL Apply 4 g topically 4 (four) times daily. (Patient taking differently: Apply 4 g topically 4 (four) times daily as needed (pain).) 100 g 0  . docusate sodium (COLACE) 100 MG capsule Take 1 capsule (100 mg total) by mouth 2 (two) times daily. 30 capsule 2  . HYDROcodone-acetaminophen (NORCO/VICODIN) 5-325 MG tablet Take 1 tablet by  mouth every 6 (six) hours as needed. 10 tablet 0  . lamoTRIgine (LAMICTAL) 150 MG tablet Take 300 mg by mouth daily.     Marland Kitchen lithium carbonate (LITHOBID) 300 MG CR tablet Take 600 mg by mouth at bedtime.    . Magnesium 500 MG TABS Take 1 tablet by mouth daily.    . metFORMIN (GLUCOPHAGE) 500 MG tablet Take 500 mg by mouth 2 (two)  times daily.    . ondansetron (ZOFRAN ODT) 4 MG disintegrating tablet 10m ODT q4 hours prn nausea/vomit (Patient taking differently: Take 4 mg by mouth every hour as needed for nausea or vomiting.) 10 tablet 0  . ondansetron (ZOFRAN) 4 MG tablet Take 1 tablet (4 mg total) by mouth every 6 (six) hours. 12 tablet 0  . pregabalin (LYRICA) 200 MG capsule TAKE 1 CAPSULE BY MOUTH TWICE A DAY 60 capsule 1  . QUEtiapine (SEROQUEL) 400 MG tablet Take 400 mg by mouth at bedtime.     .Marland KitchenQUEtiapine (SEROQUEL) 50 MG tablet Take by mouth.    . SYRINGE-NEEDLE, DISP, 3 ML 25G X 1" 3 ML MISC Use 1 needle per application once weekly 12 each 0  . tizanidine (ZANAFLEX) 2 MG capsule TAKE 2 CAPSULES (4 MG TOTAL) BY MOUTH 3 (THREE) TIMES DAILY AS NEEDED FOR MUSCLE SPASMS. 540 capsule 1  . valACYclovir (VALTREX) 1000 MG tablet Take 1 tablet (1,000 mg total) by mouth 3 (three) times daily. (Patient taking differently: Take 1,000 mg by mouth 3 (three) times daily as needed (cold sores).) 21 tablet 0  . Vitamin D, Ergocalciferol, (DRISDOL) 1.25 MG (50000 UT) CAPS capsule Take 50,000 Units by mouth once a week. Fridays    . chlorpheniramine-HYDROcodone (TUSSIONEX PENNKINETIC ER) 10-8 MG/5ML SUER Take 5 mLs by mouth every 12 (twelve) hours as needed for cough. 115 mL 0   No facility-administered medications prior to visit.    Allergies  Allergen Reactions  . Nsaids Other (See Comments)    Gastric bypass "gastric bypass"  . Other Other (See Comments)    "sleep walking"  . Zolpidem Other (See Comments)    Sleep walking  Sleep walking     Review of Systems     Objective:    Physical  Exam  There were no vitals taken for this visit. Wt Readings from Last 3 Encounters:  10/02/20 123.4 kg  09/22/20 120.2 kg  08/31/20 120.2 kg    Health Maintenance Due  Topic Date Due  . Hepatitis C Screening  Never done  . PAP SMEAR-Modifier  04/14/2020  . COVID-19 Vaccine (4 - Booster) 06/25/2020    There are no preventive care reminders to display for this patient.   Lab Results  Component Value Date   TSH 2.030 12/07/2019   Lab Results  Component Value Date   WBC 9.4 09/22/2020   HGB 12.4 09/22/2020   HCT 38.8 09/22/2020   MCV 83.1 09/22/2020   PLT 363 09/22/2020   Lab Results  Component Value Date   NA 137 09/22/2020   K 4.3 09/22/2020   CHLORIDE 108 08/16/2015   CO2 25 09/22/2020   GLUCOSE 84 09/22/2020   BUN 17 09/22/2020   CREATININE 0.68 09/22/2020   BILITOT 0.3 09/22/2020   ALKPHOS 149 (H) 09/22/2020   AST 15 09/22/2020   ALT 15 09/22/2020   PROT 7.5 09/22/2020   ALBUMIN 4.5 09/22/2020   CALCIUM 9.3 09/22/2020   ANIONGAP 9 09/22/2020   EGFR >90 08/16/2015   GFR 112.36 06/19/2020   Lab Results  Component Value Date   CHOL 213 (H) 12/07/2019   Lab Results  Component Value Date   HDL 82 12/07/2019   Lab Results  Component Value Date   LDLCALC 111 (H) 12/07/2019   Lab Results  Component Value Date   TRIG 118 12/07/2019   Lab Results  Component Value Date   CHOLHDL 3 01/12/2018   Lab Results  Component Value  Date   HGBA1C 4.9 12/07/2019       Assessment & Plan:   Problem List Items Addressed This Visit   None      No orders of the defined types were placed in this encounter.  Cough -- Unfortunately unable to prescribe controlled substances using video visits. Will give Prednisone burst in hopes this will help.    Lisette Abu, PA-C

## 2020-10-05 ENCOUNTER — Encounter: Payer: Self-pay | Admitting: Family Medicine

## 2020-10-05 NOTE — Telephone Encounter (Signed)
Patient is calling wondering if cough syrup with codeine can be sent to the pharmacy, as the PA she seen yesterday couldn't prescribe it.

## 2020-10-06 ENCOUNTER — Telehealth (INDEPENDENT_AMBULATORY_CARE_PROVIDER_SITE_OTHER): Payer: BC Managed Care – PPO | Admitting: Family Medicine

## 2020-10-06 ENCOUNTER — Other Ambulatory Visit: Payer: Self-pay

## 2020-10-06 DIAGNOSIS — J019 Acute sinusitis, unspecified: Secondary | ICD-10-CM

## 2020-10-06 MED ORDER — AMOXICILLIN-POT CLAVULANATE 875-125 MG PO TABS: 1.0000 | ORAL_TABLET | Freq: Two times a day (BID) | ORAL | 0 refills | Status: DC

## 2020-10-06 MED ORDER — CHERATUSSIN AC 100-10 MG/5ML PO SOLN: 5.0000 mL | Freq: Three times a day (TID) | ORAL | 0 refills | Status: DC | PRN

## 2020-10-06 NOTE — Progress Notes (Signed)
Patient ID: [name] [name], female   DOB: 22-Jan-1986, 35 y.o.   MRN: [id number]  This visit type was conducted due to national recommendations for restrictions regarding the COVID-19 pandemic in an effort to limit this patient's exposure and mitigate transmission in our community.   Virtual Visit via Video Note  I connected with [name] [name] on 10/06/20 at  9:20 AM EDT by a video enabled telemedicine application and verified that I am speaking with the correct person using two identifiers.  Location patient: home Location provider:work or home office Persons participating in the virtual visit: patient, provider  I discussed the limitations of evaluation and management by telemedicine and the availability of in person appointments. The patient expressed understanding and agreed to proceed.   HPI:  [name] [name] called with severe cough and sinus congestion facial pain.  She states she had onset over a week ago.  She actually had virtual visit 2 days ago.  She was prescribed prednisone which she feels like has not helped.  She had similar illness back in March which eventually improved.  She relates couple day history of low-grade fever but symptoms started about a week ago.  She has had facial pressure and intermittent headaches.  Greenish nasal discharge.  Cough has been severe at times.  She had some mild body aches.  Intermittent hoarseness.  She was started on Zithromax recently which she apparently had from prior prescription and has not seen any improvement.  Denies any nausea or vomiting.  She is tried Psychiatrist without relief of her cough and also over-the-counter Delsym.  She does have history of bipolar disorder and is on multiple medications including clonazepam and Lyrica.  She feels her bipolar disorder is relatively stable at this time.   ROS: See pertinent positives and negatives per HPI.  Past Medical History:  Diagnosis Date  . ADD (attention deficit disorder)   . Anemia    . Anxiety   . B12 deficiency   . Back pain   . Bilateral swelling of feet   . Bipolar 2 disorder ([facility]) 09/19/2019  . Constipation   . DEPRESSION   . Elevated cholesterol   . Family history of colon cancer 2019/05/29   Father, 57s; deceased.   . Fatty liver   . Fibromyalgia   . Gallbladder problem   . GERD (gastroesophageal reflux disease)   . Heartburn   . History of stomach ulcers   . Hypertension   . Hypothyroidism    Hx of, normalized TSH during pregnancy 2011  . Infertility, female   . Lactose intolerance   . Migraines   . Morbid obesity ([facility])    s/p RY 08/2012 - start weight 290 pounds  . Nephrolithiasis   . Palpitations   . Panic disorder   . PCOS (polycystic ovarian syndrome)   . PONV (postoperative nausea and vomiting)   . Pregnancy induced hypertension   . Shortness of breath   . Vitamin D deficiency     Past Surgical History:  Procedure Laterality Date  . Quintella Reichert OSTEOTOMY Left 06/24/2017   Procedure: Ralene Bathe;  Surgeon: [name], DPM;  Location: [facility];  Service: Podiatry;  Laterality: Left;  . BUNIONECTOMY Left 06/24/2017   Procedure: Anthoney Harada;  Surgeon: [name], DPM;  Location: [facility];  Service: Podiatry;  Laterality: Left;  . CESAREAN SECTION     x 2  . CHOLECYSTECTOMY N/A 02/15/2019   Procedure: LAPAROSCOPIC CHOLECYSTECTOMY WITH INTRAOPERATIVE CHOLANGIOGRAM;  Surgeon:  Andria Meuse, MD;  [facility];  Service: General;  Laterality: N/A;  . COLON RESECTION N/A 05/04/2020   Procedure: DIAGNOSTIC LAPAROSCOPY, RESECTION OF CANDYCANE, UPPER ENDOSCOPY;  Surgeon: Berna Bue, MD;  [facility];  Service: General;  Laterality: N/A;  . FOOT SURGERY    . 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   Procedure: LAPAROSCOPIC ROUX-EN-Y GASTRIC;  Surgeon: Atilano Ina, MD;  [facility];  Service: General;  Laterality: N/A;  laparoscopic roux-en-y gastric bypass  . LITHOTRIPSY Left    . TONSILLECTOMY    . TUBAL LIGATION    . UPPER GI ENDOSCOPY  08/24/2012   Procedure: UPPER GI ENDOSCOPY;  Surgeon: Atilano Ina, MD;  [facility];  Service: General;;  . WISDOM TOOTH EXTRACTION      Family History  Problem Relation Age of Onset  . Hyperlipidemia Father   . Hypertension Father   . Kidney disease Father   . Colon cancer Father 26  . Cancer Father   . Depression Father   . Anxiety disorder Father   . Bipolar disorder Father   . Sleep apnea Father   . Obesity Father   . Hypertension Mother   . Depression Mother   . Anxiety disorder Mother   . Obesity Mother   . Hypertension Maternal Grandmother   . Uterine cancer Maternal Grandmother 7  . Diabetes Maternal Grandfather     SOCIAL HX: Non-smoker   Current Outpatient Medications:  .  amoxicillin-clavulanate (AUGMENTIN) 875-125 MG tablet, Take 1 tablet by mouth 2 (two) times daily., Disp: 14 tablet, Rfl: 0 .  guaiFENesin-codeine (CHERATUSSIN AC) 100-10 MG/5ML syrup, Take 5 mLs by mouth 3 (three) times daily as needed for cough., Disp: 120 mL, Rfl: 0 .  acetaminophen (TYLENOL) 500 MG tablet, Take 2 tablets (1,000 mg total) by mouth every 6 (six) hours as needed for moderate pain., Disp: 30 tablet, Rfl: 0 .  albuterol (VENTOLIN HFA) 108 (90 Base) MCG/ACT inhaler, Inhale 2 puffs into the lungs every 4 (four) hours as needed for wheezing or shortness of breath. , Disp: , Rfl:  .  baclofen (LIORESAL) 10 MG tablet, Take 1 tablet (10 mg total) by mouth 3 (three) times daily as needed for muscle spasms., Disp: 90 tablet, Rfl: 3 .  beclomethasone (QVAR REDIHALER) 40 MCG/ACT inhaler, Inhale 1 puff into the lungs 2 (two) times daily., Disp: 1 each, Rfl: 4 .  busPIRone (BUSPAR) 15 MG tablet, Take 15 mg by mouth 3 (three) times daily., Disp: , Rfl:  .  CALCIUM PO, Take 1 tablet by mouth daily., Disp: , Rfl:  .  clonazePAM (KLONOPIN) 1 MG tablet, Take 0.5-1 tablets (0.5-1 mg total) by mouth 2 (two) times daily as needed for  anxiety., Disp: 60 tablet, Rfl: 5 .  clotrimazole (MYCELEX) 10 MG troche, Take 10 mg by mouth 5 (five) times daily., Disp: , Rfl:  .  cyanocobalamin (,VITAMIN B-12,) 1000 MCG/ML injection, Inject 1 vial per week for 3 weeks, then 1 vial per month thereafter, Disp: 6 mL, Rfl: 3 .  diclofenac Sodium (VOLTAREN) 1 % GEL, Apply 4 g topically 4 (four) times daily. (Patient taking differently: Apply 4 g topically 4 (four) times daily as needed (pain).), Disp: 100 g, Rfl: 0 .  docusate sodium (COLACE) 100 MG capsule, Take 1 capsule (100 mg total) by mouth 2 (two) times daily., Disp: 30 capsule, Rfl: 2 .  lamoTRIgine (LAMICTAL) 150 MG tablet, Take 300 mg by mouth daily. , Disp: ,  Rfl:  .  lithium carbonate (LITHOBID) 300 MG CR tablet, Take 600 mg by mouth at bedtime., Disp: , Rfl:  .  Magnesium 500 MG TABS, Take 1 tablet by mouth daily., Disp: , Rfl:  .  metFORMIN (GLUCOPHAGE) 500 MG tablet, Take 500 mg by mouth 2 (two) times daily., Disp: , Rfl:  .  ondansetron (ZOFRAN ODT) 4 MG disintegrating tablet, 4mg  ODT q4 hours prn nausea/vomit (Patient taking differently: Take 4 mg by mouth every hour as needed for nausea or vomiting.), Disp: 10 tablet, Rfl: 0 .  ondansetron (ZOFRAN) 4 MG tablet, Take 1 tablet (4 mg total) by mouth every 6 (six) hours., Disp: 12 tablet, Rfl: 0 .  predniSONE (DELTASONE) 20 MG tablet, Take 2 tablets (40 mg total) by mouth daily with breakfast for 5 days., Disp: 10 tablet, Rfl: 0 .  pregabalin (LYRICA) 200 MG capsule, TAKE 1 CAPSULE BY MOUTH TWICE A DAY, Disp: 60 capsule, Rfl: 1 .  QUEtiapine (SEROQUEL) 400 MG tablet, Take 400 mg by mouth at bedtime. , Disp: , Rfl:  .  QUEtiapine (SEROQUEL) 50 MG tablet, Take by mouth., Disp: , Rfl:  .  SYRINGE-NEEDLE, DISP, 3 ML 25G X 1" 3 ML MISC, Use 1 needle per application once weekly, Disp: 12 each, Rfl: 0 .  tizanidine (ZANAFLEX) 2 MG capsule, TAKE 2 CAPSULES (4 MG TOTAL) BY MOUTH 3 (THREE) TIMES DAILY AS NEEDED FOR MUSCLE SPASMS., Disp: 540  capsule, Rfl: 1 .  valACYclovir (VALTREX) 1000 MG tablet, Take 1 tablet (1,000 mg total) by mouth 3 (three) times daily. (Patient taking differently: Take 1,000 mg by mouth 3 (three) times daily as needed (cold sores).), Disp: 21 tablet, Rfl: 0 .  Vitamin D, Ergocalciferol, (DRISDOL) 1.25 MG (50000 UT) CAPS capsule, Take 50,000 Units by mouth once a week. Fridays, Disp: , Rfl:   EXAM:  VITALS per patient if applicable:  GENERAL: alert, oriented, appears well and in no acute distress  HEENT: atraumatic, conjunttiva clear, no obvious abnormalities on inspection of external nose and ears  NECK: normal movements of the head and neck  LUNGS: on inspection no signs of respiratory distress, breathing rate appears normal, no obvious gross SOB, gasping or wheezing  CV: no obvious cyanosis  MS: moves all visible extremities without noticeable abnormality  PSYCH/NEURO: pleasant and cooperative, no obvious depression or anxiety, speech and thought processing grossly intact  ASSESSMENT AND PLAN:  Discussed the following assessment and plan:  Acute sinusitis symptoms with associated productive cough.  Patient not improving on Zithromax and prednisone.  -We discussed trial of Augmentin 875 mg twice daily with food -Plenty of fluids and rest -She has home COVID test which she has not yet done and we have encouraged her to go ahead and check that -Agreed to limited Cheratussin AC 1 teaspoon every 6-8 hours as needed for cough-she is cautioned about potential side effects and risk of this including risk of sedation and increased risk with other medication such as clonazepam.  She states she plans to cut her clonazepam dose to half while she is on this     I discussed the assessment and treatment plan with the patient. The patient was provided an opportunity to ask questions and all were answered. The patient agreed with the plan and demonstrated an understanding of the instructions.   The patient  was advised to call back or seek an in-person evaluation if the symptoms worsen or if the condition fails to improve as anticipated.  Carolann Littler, MD

## 2020-10-09 ENCOUNTER — Telehealth: Payer: BC Managed Care – PPO | Admitting: Emergency Medicine

## 2020-10-09 ENCOUNTER — Emergency Department (HOSPITAL_BASED_OUTPATIENT_CLINIC_OR_DEPARTMENT_OTHER)
Admission: EM | Admit: 2020-10-09 | Discharge: 2020-10-09 | Disposition: A | Discharge status: Home / Self Care | Payer: BC Managed Care – PPO | Attending: Emergency Medicine | Admitting: Emergency Medicine

## 2020-10-09 ENCOUNTER — Encounter (HOSPITAL_BASED_OUTPATIENT_CLINIC_OR_DEPARTMENT_OTHER): Payer: Self-pay | Admitting: *Deleted

## 2020-10-09 ENCOUNTER — Other Ambulatory Visit (HOSPITAL_BASED_OUTPATIENT_CLINIC_OR_DEPARTMENT_OTHER): Payer: Self-pay

## 2020-10-09 ENCOUNTER — Telehealth: Payer: Self-pay

## 2020-10-09 ENCOUNTER — Emergency Department (HOSPITAL_BASED_OUTPATIENT_CLINIC_OR_DEPARTMENT_OTHER): Payer: BC Managed Care – PPO

## 2020-10-09 ENCOUNTER — Other Ambulatory Visit: Payer: Self-pay

## 2020-10-09 ENCOUNTER — Encounter: Payer: Self-pay | Admitting: Family Medicine

## 2020-10-09 DIAGNOSIS — U071 COVID-19: Secondary | ICD-10-CM

## 2020-10-09 DIAGNOSIS — R059 Cough, unspecified: Secondary | ICD-10-CM | POA: Diagnosis not present

## 2020-10-09 DIAGNOSIS — I10 Essential (primary) hypertension: Secondary | ICD-10-CM | POA: Insufficient documentation

## 2020-10-09 DIAGNOSIS — R0602 Shortness of breath: Secondary | ICD-10-CM | POA: Diagnosis not present

## 2020-10-09 DIAGNOSIS — J9811 Atelectasis: Secondary | ICD-10-CM | POA: Diagnosis not present

## 2020-10-09 DIAGNOSIS — E039 Hypothyroidism, unspecified: Secondary | ICD-10-CM | POA: Diagnosis not present

## 2020-10-09 IMAGING — DX DG CHEST 1V PORT
1 series · 1 of 1 positions shown · non-contrast
Comparison: [DATE].

CLINICAL DATA: Cough, [Y8].

EXAM:
PORTABLE CHEST 1 VIEW

[chest]
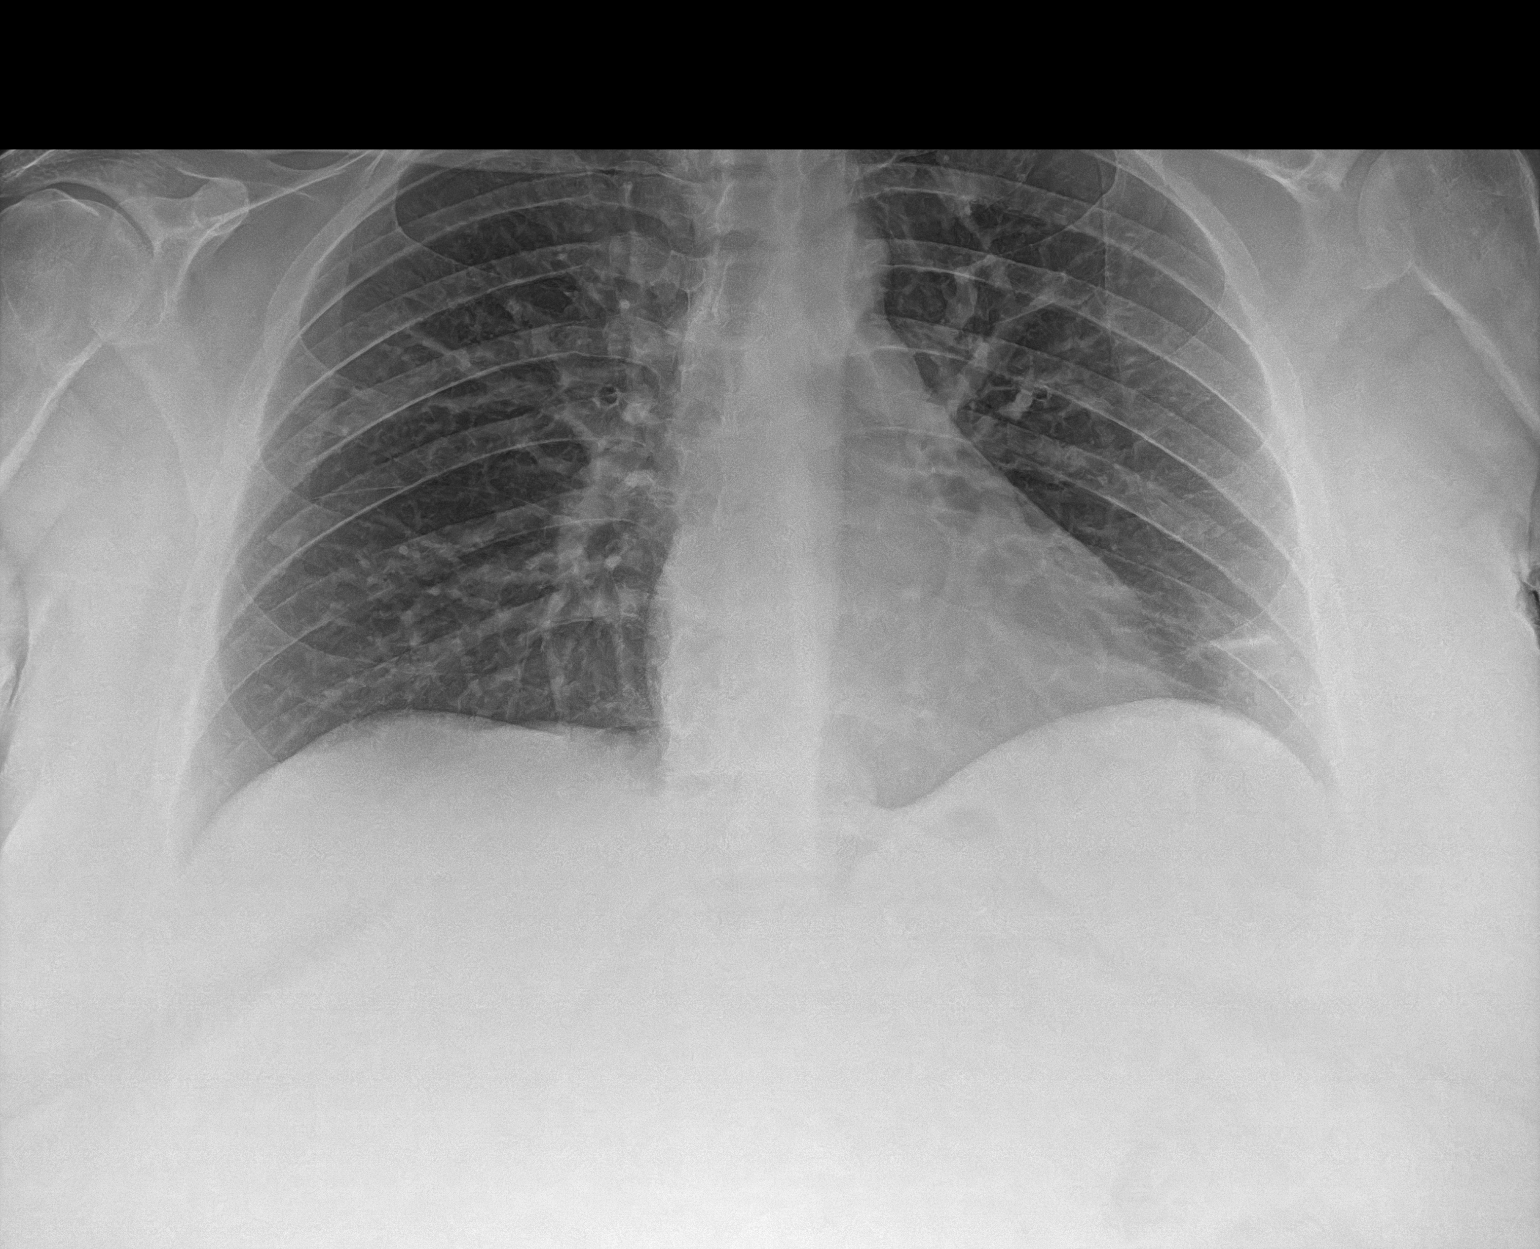

[1 of 1 positions shown; findings below may reference images not displayed]

FINDINGS: The heart size and mediastinal contours are within normal limits.
Right lung is clear. Minimal left basilar subsegmental atelectasis.
The visualized skeletal structures are unremarkable.
IMPRESSION: Minimal left basilar subsegmental atelectasis.

## 2020-10-09 MED ORDER — HYDROCOD POLST-CPM POLST ER 10-8 MG/5ML PO SUER
5.0000 mL | Freq: Two times a day (BID) | ORAL | 0 refills | Status: DC | PRN
  Filled 2020-10-09: qty 70, 7d supply, fill #0

## 2020-10-09 NOTE — Telephone Encounter (Signed)
.   LAST APPOINTMENT DATE: 08/29/2020   NEXT APPOINTMENT DATE:@Visit  date not found  MEDICATION:guaiFENesin-codeine (CHERATUSSIN AC) 100-10 MG/5ML syrup   PHARMACY:[id number] [place], [place] - [address]   Patient was seen by burchette on Saturday 4/23 and did not read rx correct and was taking 10 ML instead of and is now out of medication she is covid positive can we send in more medication?

## 2020-10-09 NOTE — Progress Notes (Signed)
[name], [name] [name] are scheduled for a virtual visit with your provider today.    Just as we do with appointments in the office, we must obtain your consent to participate.  Your consent will be active for this visit and any virtual visit you may have with one of our providers in the next 365 days.    If you have a MyChart account, I can also send a copy of this consent to you electronically.  All virtual visits are billed to your insurance company just like a traditional visit in the office.  As this is a virtual visit, video technology does not allow for your provider to perform a traditional examination.  This may limit your provider's ability to fully assess your condition.  If your provider identifies any concerns that need to be evaluated in person or the need to arrange testing such as labs, EKG, etc, we will make arrangements to do so.    Although advances in technology are sophisticated, we cannot ensure that it will always work on either your end or our end.  If the connection with a video visit is poor, we may have to switch to a telephone visit.  With either a video or telephone visit, we are not always able to ensure that we have a secure connection.   I need to obtain your verbal consent now.   Are you willing to proceed with your visit today?   [name] [name] has provided verbal consent on 10/09/2020 for a virtual visit (video or telephone).   [name] Shadow, PA-C 10/09/2020  10:55 AM   Date:  10/09/2020   ID:  [name] [name], DOB May 06, 1986, MRN [id number]  Patient Location: Home Provider Location: Home Office   Participants: Patient and Provider for Visit and Wrap up  Method of visit: Video  Location of Patient: Home Location of Provider: Home Office Consent was obtain for visit over the video. Services rendered by provider: Visit was performed via video  A video enabled telemedicine application was used and I verified that I am speaking with the correct person using  two identifiers.  PCP:  [name] Ora, MD   Chief Complaint:  Cough, fatigue, COVID positive  History of Present Illness:    [name] [name] is a 35 y.o. female with history as stated below. Presents video telehealth for an acute care visit  Onset of symptoms was about a week ago and symptoms have been persistent and include: worsening cough, congestion, fatigue, shortness of breath.  She has completed a Zpak started on 10/04/20 and currently taking Augmentin for a sinus infection dx on 10/06/20 via video visit but not feeling any better.  She has taken 4 COVID tests, including one at [facility], which were all Positive.  She reports taking 66mL of her cough syrup with codeine until yesterday, when she released she was only suppose to take 6mL.  No cough syrup taken today but fatigue persists.    Denies having fever today. Denies n/v/d. Denies dizziness, lightheadedness or syncope.   No other aggravating or relieving factors.  No other c/o.   The patient does have symptoms concerning for COVID-19 infection (fever, chills, cough, or new shortness of breath).  Patient has been tested for COVID during this illness, 4 tests have been Positive.  Past Medical, Surgical, Social History, Allergies, and Medications have been Reviewed.  Patient Active Problem List   Diagnosis Date Noted  . Abdominal pain 05/03/2020  . Dehydration 05/03/2020  . Nausea &  vomiting 05/03/2020  . 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  . Plantar fasciitis 10/31/2019  . Bipolar 2 disorder (HCC) 09/19/2019  . Fibromyalgia 09/09/2019  . Ischial bursitis of left side 07/19/2019  . Vitamin B12 deficiency 06/01/2019  . Vitamin D deficiency 06/01/2019  . Panic disorder 06/01/2019  . Family history of colon cancer 05/16/2019  . Chronic constipation 05/16/2019  . Major depression, recurrent, chronic (HCC) 05/16/2019  . Insomnia due to other mental disorder 03/13/2019  . Pain in both lower extremities  11/21/2018  . Nocturnal leg cramps 10/26/2018  . Nephrolithiasis   . Reduced libido, due to Lexapro 07/09/2017  . Binge eating disorder 04/23/2017  . Morbid obesity (HCC) 04/23/2017  . Acquired iron deficiency anemia due to decreased absorption 02/22/2015  . 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 Smoking status: Never Smoker  . Smokeless tobacco: Never Used  Substance Use Topics  . Alcohol use: Yes    Comment: rare     Current Outpatient Medications:  .  acetaminophen (TYLENOL) 500 MG tablet, Take 2 tablets (1,000 mg total) by mouth every 6 (six) hours as needed for moderate pain., Disp: 30 tablet, Rfl: 0 .  albuterol (VENTOLIN HFA) 108 (90 Base) MCG/ACT inhaler, Inhale 2 puffs into the lungs every 4 (four) hours as needed for wheezing or shortness of breath. , Disp: , Rfl:  .  amoxicillin-clavulanate (AUGMENTIN) 875-125 MG tablet, Take 1 tablet by mouth 2 (two) times daily., Disp: 14 tablet, Rfl: 0 .  baclofen (LIORESAL) 10 MG tablet, Take 1 tablet (10 mg total) by mouth 3 (three) times daily as needed for muscle spasms., Disp: 90 tablet, Rfl: 3 .  beclomethasone (QVAR REDIHALER) 40 MCG/ACT inhaler, Inhale 1 puff into the lungs 2 (two) times daily., Disp: 1 each, Rfl: 4 .  busPIRone (BUSPAR) 15 MG tablet, Take 15 mg by mouth 3 (three) times daily., Disp: , Rfl:  .  CALCIUM PO, Take 1 tablet by mouth daily., Disp: , Rfl:  .  clonazePAM (KLONOPIN) 1 MG tablet, Take 0.5-1 tablets (0.5-1 mg total) by mouth 2 (two) times daily as needed for anxiety., Disp: 60 tablet, Rfl: 5 .  clotrimazole (MYCELEX) 10 MG troche, Take 10 mg by mouth 5 (five) times daily., Disp: , Rfl:  .  cyanocobalamin (,VITAMIN B-12,) 1000 MCG/ML injection, Inject 1 vial per week for 3 weeks, then 1 vial per month thereafter, Disp: 6 mL, Rfl: 3 .  diclofenac Sodium (VOLTAREN) 1 % GEL, Apply 4 g topically 4 (four) times daily. (Patient taking differently: Apply 4 g  topically 4 (four) times daily as needed (pain).), Disp: 100 g, Rfl: 0 .  docusate sodium (COLACE) 100 MG capsule, Take 1 capsule (100 mg total) by mouth 2 (two) times daily., Disp: 30 capsule, Rfl: 2 .  guaiFENesin-codeine (CHERATUSSIN AC) 100-10 MG/5ML syrup, Take 5 mLs by mouth 3 (three) times daily as needed for cough., Disp: 120 mL, Rfl: 0 .  lamoTRIgine (LAMICTAL) 150 MG tablet, Take 300 mg by mouth daily. , Disp: , Rfl:  .  lithium carbonate (LITHOBID) 300 MG CR tablet, Take 600 mg by mouth at bedtime., Disp: , Rfl:  .  Magnesium 500 MG TABS, Take 1 tablet by mouth daily., Disp: , Rfl:  .  metFORMIN (GLUCOPHAGE) 500 MG tablet, Take 500 mg by mouth 2 (two) times daily., Disp: , Rfl:  .  ondansetron (ZOFRAN ODT) 4 MG disintegrating tablet, 4mg  ODT q4  hours prn nausea/vomit (Patient taking differently: Take 4 mg by mouth every hour as needed for nausea or vomiting.), Disp: 10 tablet, Rfl: 0 .  ondansetron (ZOFRAN) 4 MG tablet, Take 1 tablet (4 mg total) by mouth every 6 (six) hours., Disp: 12 tablet, Rfl: 0 .  predniSONE (DELTASONE) 20 MG tablet, Take 2 tablets (40 mg total) by mouth daily with breakfast for 5 days., Disp: 10 tablet, Rfl: 0 .  pregabalin (LYRICA) 200 MG capsule, TAKE 1 CAPSULE BY MOUTH TWICE A DAY, Disp: 60 capsule, Rfl: 1 .  QUEtiapine (SEROQUEL) 400 MG tablet, Take 400 mg by mouth at bedtime. , Disp: , Rfl:  .  QUEtiapine (SEROQUEL) 50 MG tablet, Take by mouth., Disp: , Rfl:  .  SYRINGE-NEEDLE, DISP, 3 ML 25G X 1" 3 ML MISC, Use 1 needle per application once weekly, Disp: 12 each, Rfl: 0 .  tizanidine (ZANAFLEX) 2 MG capsule, TAKE 2 CAPSULES (4 MG TOTAL) BY MOUTH 3 (THREE) TIMES DAILY AS NEEDED FOR MUSCLE SPASMS., Disp: 540 capsule, Rfl: 1 .  valACYclovir (VALTREX) 1000 MG tablet, Take 1 tablet (1,000 mg total) by mouth 3 (three) times daily. (Patient taking differently: Take 1,000 mg by mouth 3 (three) times daily as needed (cold sores).), Disp: 21 tablet, Rfl: 0 .  Vitamin D,  Ergocalciferol, (DRISDOL) 1.25 MG (50000 UT) CAPS capsule, Take 50,000 Units by mouth once a week. Fridays, Disp: , Rfl:    Allergies  Allergen Reactions  . Nsaids Other (See Comments)    Gastric bypass "gastric bypass"  . Other Other (See Comments)    "sleep walking"  . Zolpidem Other (See Comments)    Sleep walking  Sleep walking      ROS See HPI for history of present illness.  Physical Exam Constitutional:      General: She is not in acute distress.    Appearance: Normal appearance.     Comments: Lying on couch in darkened room, alert and oriented. Speech is clear. NAD.   HENT:     Head: Normocephalic.  Pulmonary:     Effort: Pulmonary effort is normal. No respiratory distress.     Breath sounds: No stridor.     Comments: No audible wheeze or stridor during conversation,. Able to speak in full sentences.  Neurological:     Mental Status: She is alert.  Psychiatric:        Mood and Affect: Mood normal.        Behavior: Behavior normal.               A&P  1. COVID-19  -Worsening symptoms of cough, congestion, and fatigue despite completing a Z-pak and starting Augmentin.  -Pt does not have a pulse-ox machine at home but is able to speak in full sentences. No respiratory distressed noted while pt sitting on couch at home.  -Recommend further evaluation in-person for vital signs and possible chest x-ray    Patient voiced understanding and agreement to plan. Pt plans to go to the emergency department at [facility]; states she lives 2 minutes away and feels comfortable driving herself but can also get her neighbor to drive her if needed.    Time:   Today, I have spent 15 minutes with the patient with telehealth technology discussing the above problems, reviewing the chart, previous notes, medications and orders.    Tests Ordered: No orders of the defined types were placed in this encounter.   Medication Changes: No orders of the defined types were  placed in this  encounter.    [facility]   Cherly HensenSigned, Roshon Duell O Tyrice Hewitt, PA-C  10/09/2020 10:55 AM

## 2020-10-09 NOTE — ED Provider Notes (Signed)
[facility] EMERGENCY DEPT Provider Note   CSN: [id number] Arrival date & time: 10/09/20  1104     History Chief Complaint  Patient presents with  . Covid Positive    ARLYCE CIRCLE is a 35 y.o. female.  HPI Patient presents with shortness of breath and cough.  Positive COVID test from 2 days ago.  States starting on Thursday she had symptoms.  Today is Tuesday.  Has been on azithromycin and then now Augmentin through virtual visits.  States continues to cough.  Not really relieved with her [employer] medicine at home.  Called virtual visit was told to come into the ER today because the azithromycin and Augmentin were not working for the COVID.  Has not had fevers.  States she does still feel somewhat short of breath.  Has been using an inhaler as needed at home.  Has a history of some pulmonary issues.  States after getting pneumonia they kept her on the inhalers.  She is vaccinated and boosted.    Past Medical History:  Diagnosis Date  . ADD (attention deficit disorder)   . Anemia   . Anxiety   . B12 deficiency   . Back pain   . Bilateral swelling of feet   . Bipolar 2 disorder (HCC) 09/19/2019  . Constipation   . DEPRESSION   . Elevated cholesterol   . Family history of colon cancer 2019/05/31   Father, 74s; deceased.   . Fatty liver   . Fibromyalgia   . Gallbladder problem   . GERD (gastroesophageal reflux disease)   . Heartburn   . History of stomach ulcers   . Hypertension   . Hypothyroidism    Hx of, normalized TSH during pregnancy 2011  . Infertility, female   . Lactose intolerance   . Migraines   . Morbid obesity (HCC)    s/p RY 08/2012 - start weight 290 pounds  . Nephrolithiasis   . Palpitations   . Panic disorder   . PCOS (polycystic ovarian syndrome)   . PONV (postoperative nausea and vomiting)   . Pregnancy induced hypertension   . Shortness of breath   . Vitamin D deficiency     Patient Active Problem List   Diagnosis Date Noted  .  Abdominal pain 05/03/2020  . Dehydration 05/03/2020  . Nausea & vomiting 05/03/2020  . 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  . Plantar fasciitis 10/31/2019  . Bipolar 2 disorder (HCC) 09/19/2019  . Fibromyalgia 09/09/2019  . Ischial bursitis of left side 07/19/2019  . Vitamin B12 deficiency 06/01/2019  . Vitamin D deficiency 06/01/2019  . Panic disorder 06/01/2019  . Family history of colon cancer 31-May-2019  . Chronic constipation May 31, 2019  . Major depression, recurrent, chronic (HCC) May 31, 2019  . Insomnia due to other mental disorder 03/13/2019  . Pain in both lower extremities 11/21/2018  . Nocturnal leg cramps 10/26/2018  . Nephrolithiasis   . Reduced libido, due to Lexapro 07/09/2017  . Binge eating disorder 04/23/2017  . Morbid obesity (HCC) 04/23/2017  . Acquired iron deficiency anemia due to decreased absorption 02/22/2015  . 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 Quintella Reichert OSTEOTOMY Left 06/24/2017   Procedure: Ralene Bathe;  Surgeon: [name], DPM;  Location: [facility];  Service: Podiatry;  Laterality: Left;  . BUNIONECTOMY Left 06/24/2017   Procedure: Anthoney Harada;  Surgeon: [name], DPM;  Location: [facility];  Service: Podiatry;  Laterality: Left;  . CESAREAN SECTION     x 2  . CHOLECYSTECTOMY N/A 02/15/2019   Procedure: LAPAROSCOPIC CHOLECYSTECTOMY WITH INTRAOPERATIVE CHOLANGIOGRAM;  Surgeon: Andria MeuseWhite, Christopher M, MD;  Location: [facility];  Service: General;  Laterality: N/A;  . COLON RESECTION N/A 05/04/2020   Procedure: DIAGNOSTIC LAPAROSCOPY, RESECTION OF CANDYCANE, UPPER ENDOSCOPY;  Surgeon: Berna Bueonnor, Chelsea A, MD;  Location: [facility];  Service: General;  Laterality: N/A;  . FOOT SURGERY    . 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   Procedure: LAPAROSCOPIC ROUX-EN-Y GASTRIC;  Surgeon: Atilano InaEric M Wilson, MD;  Location: [facility];  Service:  General;  Laterality: N/A;  laparoscopic roux-en-y gastric bypass  . LITHOTRIPSY Left   . TONSILLECTOMY    . TUBAL LIGATION    . UPPER GI ENDOSCOPY  08/24/2012   Procedure: UPPER GI ENDOSCOPY;  Surgeon: Atilano InaEric M Wilson, MD;  Location: [facility];  Service: General;;  . WISDOM TOOTH EXTRACTION       OB History    Gravida  2   Para  2   Term  2   Preterm  0   AB  0   Living  2     SAB  0   IAB  0   Ectopic  0   Multiple  0   Live Births  2           Family History  Problem Relation Age of Onset  . Hyperlipidemia Father   . Hypertension Father   . Kidney disease Father   . Colon cancer Father [id number]  . Cancer Father   . Depression Father   . Anxiety disorder Father   . Bipolar disorder Father   . Sleep apnea Father   . Obesity Father   . Hypertension Mother   . Depression Mother   . Anxiety disorder Mother   . Obesity Mother   . Hypertension Maternal Grandmother   . Uterine cancer Maternal Grandmother [id number]  . Diabetes Maternal Grandfather     Social History   Tobacco Use  . Smoking status: Never Smoker  . Smokeless tobacco: Never Used  Vaping Use  . Vaping Use: Never used  Substance Use Topics  . Alcohol use: Yes    Comment: rare  . Drug use: No    Home Medications Prior to Admission medications   Medication Sig Start Date End Date Taking? Authorizing Provider  chlorpheniramine-HYDROcodone (TUSSIONEX PENNKINETIC ER) 10-8 MG/5ML SUER Take 5 mLs by mouth every 12 (twelve) hours as needed for cough. 10/09/20  Yes Benjiman CorePickering, Fara Worthy, MD  acetaminophen (TYLENOL) 500 MG tablet Take 2 tablets (1,000 mg total) by mouth every 6 (six) hours as needed for moderate pain. 02/13/19   Fayrene Helperran, Bowie, PA-C  albuterol (VENTOLIN HFA) 108 (90 Base) MCG/ACT inhaler Inhale 2 puffs into the lungs every 4 (four) hours as needed for wheezing or shortness of breath.  02/21/20   [provider]  baclofen (LIORESAL) 10 MG tablet Take 1 tablet (10 mg total) by mouth 3 (three) times  daily as needed for muscle spasms. 06/29/20   Rodolph Bongorey, Evan S, MD  beclomethasone (QVAR REDIHALER) 40 MCG/ACT inhaler Inhale 1 puff into the lungs 2 (two) times daily. 06/06/20   Jarold MottoWorley, Loveda, PA  busPIRone (BUSPAR) 15 MG tablet Take 15 mg by mouth 3 (three) times daily. 11/01/19   [provider]  CALCIUM PO Take 1 tablet by mouth daily.    [provider]  clonazePAM (KLONOPIN) 1 MG tablet Take 0.5-1  tablets (0.5-1 mg total) by mouth 2 (two) times daily as needed for anxiety. 08/01/20   Willow Ora, MD  clotrimazole (MYCELEX) 10 MG troche Take 10 mg by mouth 5 (five) times daily. 09/24/20   [provider]  cyanocobalamin (,VITAMIN B-12,) 1000 MCG/ML injection Inject 1 vial per week for 3 weeks, then 1 vial per month thereafter 06/21/20   Willow Ora, MD  diclofenac Sodium (VOLTAREN) 1 % GEL Apply 4 g topically 4 (four) times daily. Patient taking differently: Apply 4 g topically 4 (four) times daily as needed (pain). 05/13/19   Palumbo, April, MD  docusate sodium (COLACE) 100 MG capsule Take 1 capsule (100 mg total) by mouth 2 (two) times daily. 05/06/20   Emelia Loron, MD  lamoTRIgine (LAMICTAL) 150 MG tablet Take 300 mg by mouth daily.  04/10/20   [provider]  lithium carbonate (LITHOBID) 300 MG CR tablet Take 600 mg by mouth at bedtime.    [provider]  Magnesium 500 MG TABS Take 1 tablet by mouth daily.    [provider]  metFORMIN (GLUCOPHAGE) 500 MG tablet Take 500 mg by mouth 2 (two) times daily. 11/01/19   [provider]  ondansetron (ZOFRAN ODT) 4 MG disintegrating tablet 4mg  ODT q4 hours prn nausea/vomit Patient taking differently: Take 4 mg by mouth every hour as needed for nausea or vomiting. 10/13/19   10/15/19, PA-C  ondansetron (ZOFRAN) 4 MG tablet Take 1 tablet (4 mg total) by mouth every 6 (six) hours. 09/22/20   11/22/20, MD  predniSONE (DELTASONE) 20 MG tablet Take 2 tablets (40 mg total)  by mouth daily with breakfast for 5 days. 10/04/20 10/09/20  10/11/20, PA-C  pregabalin (LYRICA) 200 MG capsule TAKE 1 CAPSULE BY MOUTH TWICE A DAY 10/02/20   Kirsteins, 10/04/20, MD  QUEtiapine (SEROQUEL) 400 MG tablet Take 400 mg by mouth at bedtime.  08/24/19   [provider]  QUEtiapine (SEROQUEL) 50 MG tablet Take by mouth. 06/27/20   [provider]  SYRINGE-NEEDLE, DISP, 3 ML 25G X 1" 3 ML MISC Use 1 needle per application once weekly 06/21/20   08/19/20, MD  tizanidine (ZANAFLEX) 2 MG capsule TAKE 2 CAPSULES (4 MG TOTAL) BY MOUTH 3 (THREE) TIMES DAILY AS NEEDED FOR MUSCLE SPASMS. 06/21/20   08/19/20, MD  valACYclovir (VALTREX) 1000 MG tablet Take 1 tablet (1,000 mg total) by mouth 3 (three) times daily. Patient taking differently: Take 1,000 mg by mouth 3 (three) times daily as needed (cold sores). 05/25/19   14/9/20 Mary-Margaret, FNP  Vitamin D, Ergocalciferol, (DRISDOL) 1.25 MG (50000 UT) CAPS capsule Take 50,000 Units by mouth once a week. Fridays    [provider]    Allergies    Nsaids, Other, and Zolpidem  Review of Systems   Review of Systems  Constitutional: Positive for appetite change and chills.  HENT: Negative for congestion.   Respiratory: Positive for shortness of breath.   Cardiovascular: Negative for chest pain.  Gastrointestinal: Negative for diarrhea, nausea and vomiting.  Genitourinary: Negative for flank pain.    Physical Exam Updated Vital Signs BP 112/68   Pulse 82   Temp 98.4 F (36.9 C) (Oral)   Resp 18   Ht 5\' 4"  (1.626 m)   Wt 123.4 kg   SpO2 97%   BMI 46.69 kg/m   Physical Exam Vitals and nursing note reviewed.  Constitutional:      Appearance:  She is obese.  HENT:     Head: Atraumatic.  Cardiovascular:     Rate and Rhythm: Regular rhythm.  Pulmonary:     Breath sounds: No wheezing or rhonchi.  Abdominal:     Tenderness: There is no abdominal tenderness.  Musculoskeletal:        General:  Normal range of motion.     Cervical back: Neck supple.  Skin:    General: Skin is warm.     Capillary Refill: Capillary refill takes less than 2 seconds.  Neurological:     Mental Status: She is alert and oriented to person, place, and time.     ED Results / Procedures / Treatments   Labs (all labs ordered are listed, but only abnormal results are displayed) Labs Reviewed - No data to display  EKG None  Radiology DG Chest Portable 1 View  Result Date: 10/09/2020 CLINICAL DATA:  Cough, COVID-19. EXAM: PORTABLE CHEST 1 VIEW COMPARISON:  July 14, 2019. FINDINGS: The heart size and mediastinal contours are within normal limits. Right lung is clear. Minimal left basilar subsegmental atelectasis. The visualized skeletal structures are unremarkable. IMPRESSION: Minimal left basilar subsegmental atelectasis. Electronically Signed   By: Lupita Raider M.D.   On: 10/09/2020 12:05    Procedures Procedures   Medications Ordered in ED Medications - No data to display  ED Course  I have reviewed the triage vital signs and the nursing notes.  Pertinent labs & imaging results that were available during my care of the patient were reviewed by me and considered in my medical decision making (see chart for details).    MDM Rules/Calculators/A&P                          Patient with known COVID infection.  Symptoms for around 6 days now.  Sent in for possible x-ray since she is not improving on antibiotics.  Lungs overall clear.  In no respiratory distress.  Chest x-ray does not show pneumonia.  She is a little out of the window for Paxil event.  Will discharge home with cough medicines.  Outpatient follow-up as needed.  Doubt pneumonia.  Doubt pulm embolism Final Clinical Impression(s) / ED Diagnoses Final diagnoses:  COVID-19    Rx / DC Orders ED Discharge Orders         Ordered    chlorpheniramine-HYDROcodone (TUSSIONEX PENNKINETIC ER) 10-8 MG/5ML SUER  Every 12 hours PRN         10/09/20 1225           Benjiman Core, MD 10/09/20 1232

## 2020-10-09 NOTE — Telephone Encounter (Signed)
Refill request sent to PCP.

## 2020-10-09 NOTE — Telephone Encounter (Signed)
  Patient was seen by burchette on Saturday 4/23 and did not read rx correct and was taking 10 ML instead of and is now out of medication she is covid positive can we send in more medication

## 2020-10-09 NOTE — ED Triage Notes (Signed)
covid positive-symptoms x 4 days. Tested positive 2 days ago. Reports fever continues. Pt concerned for PNA-requesting xray.  Last tylenol at 3am today.

## 2020-10-10 NOTE — Telephone Encounter (Signed)
Pt was prescribed tussionex yesterday from ER. Cannot refill robitussin with codeine as well.

## 2020-10-22 ENCOUNTER — Encounter: Payer: Self-pay | Admitting: Family Medicine

## 2020-10-30 DIAGNOSIS — K6289 Other specified diseases of anus and rectum: Secondary | ICD-10-CM | POA: Diagnosis not present

## 2020-11-01 DIAGNOSIS — Z03818 Encounter for observation for suspected exposure to other biological agents ruled out: Secondary | ICD-10-CM | POA: Diagnosis not present

## 2020-11-01 DIAGNOSIS — R059 Cough, unspecified: Secondary | ICD-10-CM | POA: Diagnosis not present

## 2020-11-01 DIAGNOSIS — R112 Nausea with vomiting, unspecified: Secondary | ICD-10-CM | POA: Diagnosis not present

## 2020-11-01 DIAGNOSIS — Z20822 Contact with and (suspected) exposure to covid-19: Secondary | ICD-10-CM | POA: Diagnosis not present

## 2020-11-02 ENCOUNTER — Ambulatory Visit: Payer: BC Managed Care – PPO | Admitting: Physical Medicine & Rehabilitation

## 2020-11-06 ENCOUNTER — Telehealth (INDEPENDENT_AMBULATORY_CARE_PROVIDER_SITE_OTHER): Payer: BC Managed Care – PPO | Admitting: Family Medicine

## 2020-11-06 ENCOUNTER — Encounter: Payer: Self-pay | Admitting: Family Medicine

## 2020-11-06 VITALS — BP 109/90 | HR 102

## 2020-11-06 DIAGNOSIS — U099 Post covid-19 condition, unspecified: Secondary | ICD-10-CM

## 2020-11-06 DIAGNOSIS — F410 Panic disorder [episodic paroxysmal anxiety] without agoraphobia: Secondary | ICD-10-CM

## 2020-11-06 DIAGNOSIS — F3181 Bipolar II disorder: Secondary | ICD-10-CM | POA: Diagnosis not present

## 2020-11-06 DIAGNOSIS — M797 Fibromyalgia: Secondary | ICD-10-CM | POA: Diagnosis not present

## 2020-11-06 DIAGNOSIS — K219 Gastro-esophageal reflux disease without esophagitis: Secondary | ICD-10-CM

## 2020-11-06 MED ORDER — CLONAZEPAM 1 MG PO TABS: 0.5000 mg | ORAL_TABLET | Freq: Two times a day (BID) | ORAL | 5 refills | Status: DC | PRN

## 2020-11-06 NOTE — Progress Notes (Signed)
Virtual Visit via Video Note  Subjective  CC:  Chief Complaint  Patient presents with  . Covid Positive    Tested positive 10/09/2020. Still having symptoms but testing negative.  . Chest Pain  . Headache  . Shortness of Breath  . Nausea     I connected with [name] [name] on 11/06/20 at 11:30 AM EDT by a video enabled telemedicine application and verified that I am speaking with the correct person using two identifiers. Location patient: Home Location provider: [facility] [address] Office Persons participating in the virtual visit: [name] [name], [name] Ora, MD [name] [name] CMA  I discussed the limitations of evaluation and management by telemedicine and the availability of in person appointments. The patient expressed understanding and agreed to proceed. HPI: [name] [name] is a 35 y.o. female who was contacted today to address the problems listed above in the chief complaint.  35 year old with multiple medical problems including panic disorder, bipolar disorder, fibromyalgia, GERD presents due to persistent COVID symptoms.  I reviewed multiple notes from her multiple urgent care visits and telehealth visits.  Briefly, she was treated for acute viral bronchitis, then bacterial sinusitis with Augmentin, then diagnosed with COVID on April 26.  She was evaluated emergency room.  Chest x-ray was unremarkable.  She has been using cough medicines, Qvar and albuterol regularly and complains of persistent shortness of breath, burning in the chest, fatigue, headaches and poor sleep.  Home test have been negative.  No new fevers.  No history of asthma or COPD.  She is being treated by psychiatry for her mood disorder and is on Klonopin, hydroxyzine, BuSpar, and high-dose Seroquel in addition to Lamictal and lithium.   Assessment  1. Post-COVID syndrome   2. Panic disorder   3. Bipolar 2 disorder (HCC)   4. Fibromyalgia   5. Gastroesophageal reflux  disease, unspecified whether esophagitis present      Plan   Post COVID syndrome: Mild symptoms, mainly fatigue and headaches.  Suspect some of her respiratory complaints are related to her chronic anxiety.  Supportive care recommended.  Suspect atypical chest pain is mostly related to GERD, stress-induced, post-COVID.  Start Protonix 20 mg daily.  Patient has medications at home for use.  Chronic anxiety worsened by COVID on mood disorder.  Patient is sedated during telehealth visit today.  Discussed use of medications.  Recommended decreasing Klonopin to 0.5 mg twice daily and using BuSpar up to 3 times daily.  She will continue her other mood medications.  Health explained that some of her shortness of breath symptoms may be related to anxiety.  Recommend decreasing albuterol use, frequency due to side effects of anxiety.  Recommend office visit in the next 3 to 4 weeks to recheck lungs.  Likely can stop Qvar.  This was prescribed back in December for an acute viral illness.  Fibromyalgia and chronic pain.  Continue to monitor.  Could be exacerbating or worsening her subjective COVID symptoms.  No red flags identified today.  Follow up: 3 to 4 weeks to recheck lungs in the office, also schedule complete physical in 3 to 4 months.   Meds ordered this encounter  Medications  . clonazePAM (KLONOPIN) 1 MG tablet    Sig: Take 0.5 tablets (0.5 mg total) by mouth 2 (two) times daily as needed for anxiety.    Dispense:  60 tablet    Refill:  5      I reviewed  the patients updated PMH, FH, and SocHx.    Patient Active Problem List   Diagnosis Date Noted  . Bipolar 2 disorder (HCC) 09/19/2019    Priority: High  . Fibromyalgia 09/09/2019    Priority: High  . Panic disorder 06/01/2019    Priority: High  . Family history of colon cancer 05/16/2019    Priority: High  . Major depression, recurrent, chronic (HCC) 05/16/2019    Priority: High  . Insomnia due to other mental disorder  03/13/2019    Priority: High  . Acquired iron deficiency anemia due to decreased absorption 02/22/2015    Priority: High  . 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    Priority: High  . Vitamin B12 deficiency 06/01/2019    Priority: Medium  . Chronic constipation 05/16/2019    Priority: Medium  . Pain in both lower extremities 11/21/2018    Priority: Medium  . Nocturnal leg cramps 10/26/2018    Priority: Medium  . Nephrolithiasis     Priority: Medium  . Binge eating disorder 04/23/2017    Priority: Medium  . Vitamin D deficiency 06/01/2019    Priority: Low  . Reduced libido, due to Lexapro 07/09/2017    Priority: Low  . 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  . Plantar fasciitis 10/31/2019  . Ischial bursitis of left side 07/19/2019   Current Meds  Medication Sig  . acetaminophen (TYLENOL) 500 MG tablet Take 2 tablets (1,000 mg total) by mouth every 6 (six) hours as needed for moderate pain.  Marland Kitchen albuterol (VENTOLIN HFA) 108 (90 Base) MCG/ACT inhaler Inhale 2 puffs into the lungs every 4 (four) hours as needed for wheezing or shortness of breath.   . baclofen (LIORESAL) 10 MG tablet Take 1 tablet (10 mg total) by mouth 3 (three) times daily as needed for muscle spasms.  . beclomethasone (QVAR REDIHALER) 40 MCG/ACT inhaler Inhale 1 puff into the lungs 2 (two) times daily.  . busPIRone (BUSPAR) 15 MG tablet Take 15 mg by mouth 3 (three) times daily.  Marland Kitchen CALCIUM PO Take 1 tablet by mouth daily.  . clotrimazole (MYCELEX) 10 MG troche Take 10 mg by mouth 5 (five) times daily.  . cyanocobalamin (,VITAMIN B-12,) 1000 MCG/ML injection Inject 1 vial per week for 3 weeks, then 1 vial per month thereafter  . diclofenac Sodium (VOLTAREN) 1 % GEL Apply 4 g topically 4 (four) times daily. (Patient taking differently: Apply 4 g topically 4 (four) times daily as needed (pain).)  . docusate sodium (COLACE) 100 MG capsule Take 1 capsule (100 mg total) by mouth 2 (two) times  daily.  Marland Kitchen lamoTRIgine (LAMICTAL) 150 MG tablet Take 300 mg by mouth daily.   Marland Kitchen lithium carbonate (LITHOBID) 300 MG CR tablet Take 600 mg by mouth at bedtime.  . Magnesium 500 MG TABS Take 1 tablet by mouth daily.  . metFORMIN (GLUCOPHAGE) 500 MG tablet Take 500 mg by mouth 2 (two) times daily.  . ondansetron (ZOFRAN) 4 MG tablet Take 1 tablet (4 mg total) by mouth every 6 (six) hours.  . pregabalin (LYRICA) 200 MG capsule TAKE 1 CAPSULE BY MOUTH TWICE A DAY  . QUEtiapine (SEROQUEL) 400 MG tablet Take 400 mg by mouth at bedtime.   Marland Kitchen QUEtiapine (SEROQUEL) 50 MG tablet Take by mouth.  . SYRINGE-NEEDLE, DISP, 3 ML 25G X 1" 3 ML MISC Use 1 needle per application once weekly  . tizanidine (ZANAFLEX) 2 MG capsule TAKE 2 CAPSULES (4 MG TOTAL) BY MOUTH 3 (  THREE) TIMES DAILY AS NEEDED FOR MUSCLE SPASMS.  . valACYclovir (VALTREX) 1000 MG tablet Take 1 tablet (1,000 mg total) by mouth 3 (three) times daily. (Patient taking differently: Take 1,000 mg by mouth 3 (three) times daily as needed (cold sores).)  . Vitamin D, Ergocalciferol, (DRISDOL) 1.25 MG (50000 UT) CAPS capsule Take 50,000 Units by mouth once a week. Fridays  . [DISCONTINUED] clonazePAM (KLONOPIN) 1 MG tablet Take 0.5-1 tablets (0.5-1 mg total) by mouth 2 (two) times daily as needed for anxiety.  . [DISCONTINUED] ondansetron (ZOFRAN ODT) 4 MG disintegrating tablet 4mg  ODT q4 hours prn nausea/vomit (Patient taking differently: Take 4 mg by mouth every hour as needed for nausea or vomiting.)    Allergies: Patient is allergic to nsaids, other, and zolpidem. Family History: Patient family history includes Anxiety disorder in her father and mother; Bipolar disorder in her father; Cancer in her father; Colon cancer (age of onset: 2) in her father; Depression in her father and mother; Diabetes in her maternal grandfather; Hyperlipidemia in her father; Hypertension in her father, maternal grandmother, and mother; Kidney disease in her father; Obesity  in her father and mother; Sleep apnea in her father; Uterine cancer (age of onset: 48) in her maternal grandmother. Social History:  Patient  reports that she has never smoked. She has never used smokeless tobacco. She reports current alcohol use. She reports that she does not use drugs.  Review of Systems: Constitutional: Negative for fever malaise or anorexia Cardiovascular: negative for chest pain Respiratory: negative for SOB or persistent cough Gastrointestinal: negative for abdominal pain  OBJECTIVE Vitals: BP 109/90   Pulse (!) 102  General: no acute distress , A&Ox3 but appears slightly sedated, speech is slowed but coherent No respiratory distress, no retractions 71, MD

## 2020-11-09 ENCOUNTER — Other Ambulatory Visit: Payer: Self-pay

## 2020-11-09 ENCOUNTER — Ambulatory Visit: Payer: BC Managed Care – PPO | Admitting: Family Medicine

## 2020-11-09 ENCOUNTER — Encounter: Payer: Self-pay | Admitting: Family Medicine

## 2020-11-09 VITALS — BP 124/90 | HR 91 | Temp 98.7°F | Ht 64.0 in | Wt 270.0 lb

## 2020-11-09 DIAGNOSIS — R059 Cough, unspecified: Secondary | ICD-10-CM

## 2020-11-09 DIAGNOSIS — Z23 Encounter for immunization: Secondary | ICD-10-CM | POA: Diagnosis not present

## 2020-11-09 DIAGNOSIS — U099 Post covid-19 condition, unspecified: Secondary | ICD-10-CM | POA: Diagnosis not present

## 2020-11-09 NOTE — Patient Instructions (Signed)
Please return in 4-6 weeks to recheck lungs/qvar  Use tessalon perles.   Stop the qvar if cough improves.  Use albuterol only for wheezing and if it is helpful. You may not need it.  Continue your allergy medications.   If you have any questions or concerns, please don't hesitate to send me a message via MyChart or call the office at [phone]. Thank you for visiting with [place] today! It's our pleasure caring for you.

## 2020-11-09 NOTE — Progress Notes (Signed)
Subjective  CC:  Chief Complaint  Patient presents with  . Cough    Post covid, check lungs. Dry cough  . Shortness of Breath    HPI: [name] [name] is a 35 y.o. female who presents to the office today to address the problems listed above in the chief complaint.  Pt had telehealth visit 3 days for covid - post covid sxs. Today, reports new cough that started yesterday. Hacking w/o sob. No f/c/s. No sinus pain. Some clear rhinorrhea and pnd. On flonase and zyrtec.  Please see note from 3 days ago;  No malaise, nl appetite.   Assessment  1. Cough   2. Post-COVID syndrome      Plan   cough:  Irritant. ? Anxiety component. No wheezing or rales. rec tesalon perles and monitor. May use albuterol if she finds it helpful but may not need it. Once cough improved, rec stopping qvar and will recheck lungs in office. She has no h/o asthma  Follow up: 4-6 weeks to recheck lungs.  03/06/2021  No orders of the defined types were placed in this encounter.  No orders of the defined types were placed in this encounter.     I reviewed the patients updated PMH, FH, and SocHx.    Patient Active Problem List   Diagnosis Date Noted  . Bipolar 2 disorder (HCC) 09/19/2019    Priority: High  . Fibromyalgia 09/09/2019    Priority: High  . Panic disorder 06/01/2019    Priority: High  . Family history of colon cancer 05/16/2019    Priority: High  . Major depression, recurrent, chronic (HCC) 05/16/2019    Priority: High  . Insomnia due to other mental disorder 03/13/2019    Priority: High  . Acquired iron deficiency anemia due to decreased absorption 02/22/2015    Priority: High  . 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    Priority: High  . Vitamin B12 deficiency 06/01/2019    Priority: Medium  . Chronic constipation 05/16/2019    Priority: Medium  . Pain in both lower extremities 11/21/2018    Priority: Medium  . Nocturnal leg cramps 10/26/2018    Priority: Medium   . Nephrolithiasis     Priority: Medium  . Binge eating disorder 04/23/2017    Priority: Medium  . Vitamin D deficiency 06/01/2019    Priority: Low  . Reduced libido, due to Lexapro 07/09/2017    Priority: Low  . 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  . Plantar fasciitis 10/31/2019  . Ischial bursitis of left side 07/19/2019   Current Meds  Medication Sig  . acetaminophen (TYLENOL) 500 MG tablet Take 2 tablets (1,000 mg total) by mouth every 6 (six) hours as needed for moderate pain.  Marland Kitchen albuterol (VENTOLIN HFA) 108 (90 Base) MCG/ACT inhaler Inhale 2 puffs into the lungs every 4 (four) hours as needed for wheezing or shortness of breath.   . baclofen (LIORESAL) 10 MG tablet Take 1 tablet (10 mg total) by mouth 3 (three) times daily as needed for muscle spasms.  . beclomethasone (QVAR REDIHALER) 40 MCG/ACT inhaler Inhale 1 puff into the lungs 2 (two) times daily.  . busPIRone (BUSPAR) 15 MG tablet Take 15 mg by mouth 3 (three) times daily.  Marland Kitchen CALCIUM PO Take 1 tablet by mouth daily.  . clonazePAM (KLONOPIN) 1 MG tablet Take 0.5 tablets (0.5 mg total) by mouth 2 (two) times daily as needed for anxiety.  . clotrimazole (MYCELEX) 10 MG troche Take  10 mg by mouth 5 (five) times daily.  . cyanocobalamin (,VITAMIN B-12,) 1000 MCG/ML injection Inject 1 vial per week for 3 weeks, then 1 vial per month thereafter  . diclofenac Sodium (VOLTAREN) 1 % GEL Apply 4 g topically 4 (four) times daily. (Patient taking differently: Apply 4 g topically 4 (four) times daily as needed (pain).)  . docusate sodium (COLACE) 100 MG capsule Take 1 capsule (100 mg total) by mouth 2 (two) times daily.  Marland Kitchen lamoTRIgine (LAMICTAL) 150 MG tablet Take 300 mg by mouth daily.   Marland Kitchen lithium carbonate (LITHOBID) 300 MG CR tablet Take 600 mg by mouth at bedtime.  . Magnesium 500 MG TABS Take 1 tablet by mouth daily.  . metFORMIN (GLUCOPHAGE) 500 MG tablet Take 500 mg by mouth 2 (two) times daily.  . ondansetron  (ZOFRAN) 4 MG tablet Take 1 tablet (4 mg total) by mouth every 6 (six) hours.  . pregabalin (LYRICA) 200 MG capsule TAKE 1 CAPSULE BY MOUTH TWICE A DAY  . QUEtiapine (SEROQUEL) 400 MG tablet Take 400 mg by mouth at bedtime.   Marland Kitchen QUEtiapine (SEROQUEL) 50 MG tablet Take by mouth.  . SYRINGE-NEEDLE, DISP, 3 ML 25G X 1" 3 ML MISC Use 1 needle per application once weekly  . tizanidine (ZANAFLEX) 2 MG capsule TAKE 2 CAPSULES (4 MG TOTAL) BY MOUTH 3 (THREE) TIMES DAILY AS NEEDED FOR MUSCLE SPASMS.  . valACYclovir (VALTREX) 1000 MG tablet Take 1 tablet (1,000 mg total) by mouth 3 (three) times daily. (Patient taking differently: Take 1,000 mg by mouth 3 (three) times daily as needed (cold sores).)  . Vitamin D, Ergocalciferol, (DRISDOL) 1.25 MG (50000 UT) CAPS capsule Take 50,000 Units by mouth once a week. Fridays    Allergies: Patient is allergic to nsaids, other, and zolpidem. Family History: Patient family history includes Anxiety disorder in her father and mother; Bipolar disorder in her father; Cancer in her father; Colon cancer (age of onset: 19) in her father; Depression in her father and mother; Diabetes in her maternal grandfather; Hyperlipidemia in her father; Hypertension in her father, maternal grandmother, and mother; Kidney disease in her father; Obesity in her father and mother; Sleep apnea in her father; Uterine cancer (age of onset: 40) in her maternal grandmother. Social History:  Patient  reports that she has never smoked. She has never used smokeless tobacco. She reports current alcohol use. She reports that she does not use drugs.  Review of Systems: Constitutional: Negative for fever malaise or anorexia Cardiovascular: negative for chest pain Respiratory: negative for SOB or persistent cough Gastrointestinal: negative for abdominal pain  Objective  Vitals: BP 124/90   Pulse 91   Temp 98.7 F (37.1 C) (Temporal)   Ht 5\' 4"  (1.626 m)   Wt 270 lb (122.5 kg)   SpO2 97%   BMI  46.35 kg/m  General: no acute distress , A&Ox3, no sedation today. Appears happy and comfortable. occ irritant hacking cough. dry HEENT: PEERL, conjunctiva normal, neck is supple Cardiovascular:  RRR without murmur or gallop.  Respiratory:  Good breath sounds bilaterally, CTAB with normal respiratory effort, no wheezing. No rales, no rhonchi Skin:  Warm, no rashes     Commons side effects, risks, benefits, and alternatives for medications and treatment plan prescribed today were discussed, and the patient expressed understanding of the given instructions. Patient is instructed to call or message via MyChart if he/she has any questions or concerns regarding our treatment plan. No barriers to understanding were identified.  We discussed Red Flag symptoms and signs in detail. Patient expressed understanding regarding what to do in case of urgent or emergency type symptoms.   Medication list was reconciled, printed and provided to the patient in AVS. Patient instructions and summary information was reviewed with the patient as documented in the AVS. This note was prepared with assistance of Dragon voice recognition software. Occasional wrong-word or sound-a-like substitutions may have occurred due to the inherent limitations of voice recognition software  This visit occurred during the SARS-CoV-2 public health emergency.  Safety protocols were in place, including screening questions prior to the visit, additional usage of staff PPE, and extensive cleaning of exam room while observing appropriate contact time as indicated for disinfecting solutions.

## 2020-11-14 NOTE — Progress Notes (Signed)
I, Christoper Fabian, LAT, ATC, am serving as scribe for Dr. Clementeen Graham.  [name] [name] is a 35 y.o. female who presents to [facility] today for f/u B hip and groin pain and chronic pain syndrome. Pt was previously seen by Dr. Denyse Amass on 06/25/20 with persistent pain and upper and lower extremity paresthesia. A NCV study was ordered and completed on 06/28/20, which was normal. Today, pt locates her pain to her B ant hip and groin x approximately 4 months.  She states that she has tried a HEP that she found on-line but these exercises don't seem to help.  Aggravating factors include sitting and walking.  She locates her pain to her B groin and lateral hip w/ some radiating pain into her B inner thighs.  She has taken IBU, Tylenol, Lyrica, Tizanidine.  Dx imaging: 06/28/20 NCV study  06/24/20 C-spine, brain, & t-spine MRI  07/16/19 L-spine MRI  Pertinent review of systems: No fevers or chills  Relevant historical information: Bipolar disorder.  Fibromyalgia.  Receiving lumbar facet medial branch ablation by Dr. Clementeen Hoof   Exam:  BP 120/86 (BP Location: Right Arm, Patient Position: Sitting, Cuff Size: Large)   Pulse (!) 104   Ht 5\' 4"  (1.626 m)   Wt 270 lb 6.4 oz (122.7 kg)   SpO2 98%   BMI 46.41 kg/m  General: Well Developed, well nourished, and in no acute distress.   MSK: L-spine nontender midline normal lumbar motion.  Negative straight leg raise test. Right hip normal-appearing Normal motion some pain with flexion and internal rotation. Hip abduction strength is painful and diminished 4/5.  External rotation strength is intact.  Right hip normal-appearing Normal motion. Hip abduction strength mildly diminished 4/5 with some pain. External rotation strength is intact.    Lab and Radiology Results  X-ray images of lumbar spine and hips visible on CT scan abdomen and pelvis April 2022 personally independently interpreted.  No severe lumbar DDD.  No  severe hip femoral acetabular DJD.  No fractures visible.    Assessment and Plan: 35 y.o. female with anterior and lateral hip pain bilateral right worse than left.  Pain is multifactorial.  Lateral hip pain thought to be due to hip abductor tendinopathy/greater trochanteric bursitis.  This will likely improve with physical therapy.  Physical therapy referral ordered today.  Anterior hip pain less clear.  Pain could be due to femoral acetabular impingement or labrum tear or even L3 lumbar radiculopathy especially because she has some pain radiating to the inner thigh.  Plan to have trial of physical therapy and if not better consider further diagnostic imaging including hip MRI arthrogram starting with the right.  However she also is currently under the care of Dr. 31 for her chronic back pain and currently receiving medial branch blocks and ablations.  I think it is possible that she may build to proceed with a trial of a L3 epidural steroid injection.  I will send a message to him to see if that is possible.  If not we may need to update lumbar MRI.  Recheck 6 weeks. PDMP not reviewed this encounter. Orders Placed This Encounter  Procedures  . Ambulatory referral to Physical Therapy    Referral Priority:   Routine    Referral Type:   Physical Medicine    Referral Reason:   Specialty Services Required    Requested Specialty:   Physical Therapy   Meds ordered this encounter  Medications  .  tizanidine (ZANAFLEX) 2 MG capsule    Sig: Take 2 capsules (4 mg total) by mouth 3 (three) times daily as needed for muscle spasms.    Dispense:  540 capsule    Refill:  1     Discussed warning signs or symptoms. Please see discharge instructions. Patient expresses understanding.   The above documentation has been reviewed and is accurate and complete Clementeen Graham, M.D.

## 2020-11-15 ENCOUNTER — Encounter: Payer: Self-pay | Admitting: Family Medicine

## 2020-11-15 ENCOUNTER — Ambulatory Visit: Payer: Self-pay

## 2020-11-15 ENCOUNTER — Other Ambulatory Visit: Payer: Self-pay

## 2020-11-15 ENCOUNTER — Ambulatory Visit: Payer: BC Managed Care – PPO | Admitting: Family Medicine

## 2020-11-15 VITALS — BP 120/86 | HR 104 | Ht 64.0 in | Wt 270.4 lb

## 2020-11-15 DIAGNOSIS — G4762 Sleep related leg cramps: Secondary | ICD-10-CM | POA: Diagnosis not present

## 2020-11-15 DIAGNOSIS — M25551 Pain in right hip: Secondary | ICD-10-CM | POA: Diagnosis not present

## 2020-11-15 DIAGNOSIS — M25552 Pain in left hip: Secondary | ICD-10-CM | POA: Diagnosis not present

## 2020-11-15 MED ORDER — TIZANIDINE HCL 2 MG PO CAPS: 4.0000 mg | ORAL_CAPSULE | Freq: Three times a day (TID) | ORAL | 1 refills | Status: DC | PRN

## 2020-11-15 NOTE — Patient Instructions (Signed)
Thank you for coming in today.  I've referred you to Physical Therapy.  Let us know if you don't hear from them in one week.  I will ask Dr Kirtland Bouchard to do epidural steroid injection at L3. Lets see.   Recheck with me in 6 weeks.   We have way more to do if needed.

## 2020-11-19 ENCOUNTER — Ambulatory Visit: Payer: BC Managed Care – PPO | Admitting: Family Medicine

## 2020-11-22 ENCOUNTER — Other Ambulatory Visit: Payer: Self-pay | Admitting: *Deleted

## 2020-11-22 MED ORDER — DIAZEPAM 10 MG PO TABS: ORAL_TABLET | ORAL | 0 refills | Status: DC

## 2020-11-22 NOTE — Telephone Encounter (Signed)
Requesting pre med for procedure 11/27/20. Called to [facility]. Knows to have a driver.

## 2020-11-27 ENCOUNTER — Encounter
Payer: BC Managed Care – PPO | Attending: Physical Medicine & Rehabilitation | Admitting: Physical Medicine & Rehabilitation

## 2020-11-27 ENCOUNTER — Encounter: Payer: Self-pay | Admitting: Physical Medicine & Rehabilitation

## 2020-11-27 ENCOUNTER — Other Ambulatory Visit: Payer: Self-pay

## 2020-11-27 VITALS — Temp 98.4°F | Ht 64.0 in | Wt 268.0 lb

## 2020-11-27 DIAGNOSIS — M47816 Spondylosis without myelopathy or radiculopathy, lumbar region: Secondary | ICD-10-CM

## 2020-11-27 MED ORDER — PREGABALIN 200 MG PO CAPS: 200.0000 mg | ORAL_CAPSULE | Freq: Two times a day (BID) | ORAL | 2 refills | Status: DC

## 2020-11-27 NOTE — Patient Instructions (Signed)
You had a radio frequency procedure today This was done to alleviate joint pain in your lumbar area We injected lidocaine which is a local anesthetic.  You may experience soreness at the injection sites. You may also experienced some irritation of the nerves that were heated I'm recommending ice for 30 minutes every 2 hours as needed for the next 24-48 hours   

## 2020-11-27 NOTE — Progress Notes (Signed)
Left L5 dorsal ramus., left L4 and left L3 medial branch radio frequency neurotomy under fluoroscopic guidance  Indication: Low back pain due to lumbar spondylosis which has been relieved on 2 occasions by greater than 50% by lumbar medial branch blocks at corresponding levels.  Informed consent was obtained after describing risks and benefits of the procedure with the patient, this includes bleeding, bruising, infection, paralysis and medication side effects. The patient wishes to proceed and has given written consent. The patient was placed in a prone position. The lumbar and sacral area was marked and prepped with Betadine. A 25-gauge 1-1/2 inch needle was inserted into the skin and subcutaneous tissue at 3 sites in one ML of 2% lidocaine was injected into each site. Then a 18-gauge 15 cm radio frequency needle with a 1 cm curved active tip was inserted targeting the left S1 SAP/sacral ala junction. Bone contact was made and confirmed with lateral imaging.  motor stimulation at 2 Hz confirm proper needle location followed by injection of one ML of 2% MPF lidocaine. Then the left L5 SAP/transverse process junction was targeted. Bone contact was made and confirmed with lateral imaging motor stimulation at 2 Hz confirm proper needle location followed by injection of one ML of the solution containing one ML of  2% MPF lidocaine. Then the left L4 SAP/transverse process junction was targeted. Bone contact was made and confirmed with lateral imaging. motor stimulation at 2 Hz confirm proper needle location followed by injection of one ML of the solution containing one ML of2% MPF lidocaine. Radio frequency lesion  at 80C for 90 seconds was performed. Needles were removed. Post procedure instructions and vital signs were performed. Patient tolerated procedure well. Followup appointment was given.  

## 2020-11-27 NOTE — Progress Notes (Signed)
  PROCEDURE RECORD [facility]   Name: MADELEIN MAHADEO DOB:16-Sep-1985 MRN: [id number]  Date:11/27/2020  Physician: Claudette Laws, MD    Nurse/CMA: Nedra Hai, CMA  Allergies:  Allergies  Allergen Reactions   Nsaids Other (See Comments)    Gastric bypass "gastric bypass"   Other Other (See Comments)    "sleep walking"   Zolpidem Other (See Comments)    Sleep walking  Sleep walking     Consent Signed: Yes.    Is patient diabetic? No.  CBG today? na  Pregnant: No. LMP: No LMP recorded. Patient has had an ablation. (age 80-55)  Anticoagulants: no Anti-inflammatory: no Antibiotics: no  Procedure: Left L3-4-5 Radiofrequency  Position: Prone Start Time: 1:04pm  End Time: 1:18pm  Fluoro Time: 36  RN/CMA Nedra Hai, CMA Audriella Blakeley, CMA    Time 12:47pm 1:26pm    BP 124/88 135/+93    Pulse 98 96    Respirations 16 16    O2 Sat 98 97    S/S 6 6    Pain Level 6/10 9/10     D/C home with husband, patient A & O X 3, D/C instructions reviewed, and sits independently.

## 2020-12-06 ENCOUNTER — Ambulatory Visit: Payer: BC Managed Care – PPO

## 2020-12-11 DIAGNOSIS — F1910 Other psychoactive substance abuse, uncomplicated: Secondary | ICD-10-CM | POA: Diagnosis not present

## 2020-12-11 DIAGNOSIS — F3181 Bipolar II disorder: Secondary | ICD-10-CM | POA: Diagnosis not present

## 2020-12-14 ENCOUNTER — Other Ambulatory Visit: Payer: Self-pay

## 2020-12-14 ENCOUNTER — Ambulatory Visit: Payer: BC Managed Care – PPO | Admitting: Family Medicine

## 2020-12-14 ENCOUNTER — Encounter: Payer: Self-pay | Admitting: Family Medicine

## 2020-12-14 VITALS — BP 124/82 | HR 95 | Temp 98.5°F | Ht 64.0 in | Wt 279.0 lb

## 2020-12-14 DIAGNOSIS — R059 Cough, unspecified: Secondary | ICD-10-CM | POA: Diagnosis not present

## 2020-12-14 DIAGNOSIS — Z8616 Personal history of COVID-19: Secondary | ICD-10-CM | POA: Diagnosis not present

## 2020-12-14 DIAGNOSIS — K219 Gastro-esophageal reflux disease without esophagitis: Secondary | ICD-10-CM | POA: Diagnosis not present

## 2020-12-14 DIAGNOSIS — F410 Panic disorder [episodic paroxysmal anxiety] without agoraphobia: Secondary | ICD-10-CM

## 2020-12-14 DIAGNOSIS — Z23 Encounter for immunization: Secondary | ICD-10-CM | POA: Diagnosis not present

## 2020-12-14 MED ORDER — PANTOPRAZOLE SODIUM 20 MG PO TBEC: 20.0000 mg | DELAYED_RELEASE_TABLET | Freq: Every day | ORAL | 1 refills | Status: DC

## 2020-12-14 NOTE — Progress Notes (Signed)
Subjective  CC:  Chief Complaint  Patient presents with   Follow-up    Recheck lungs/qvar ; c/o acid reflux getting worse over the past 2 months    HPI: [name] [name] is a 35 y.o. female who presents to the office today to address the problems listed above in the chief complaint. Cough/covid: see last notes; had several visits for cough. At last visit, recommended stopping the qvar and monitoring. Fortunately, her cough has completely resolved. Energy levels are back to normal. No wheezing, sob or cough.  Panic: discussed her anxiety component to her physical sxs and working to not over react.  Gerd: we discussed gerd at last visit and pt was to start protonix but didn't. Now with worsening gerd sxs. No vomiting or melena. No constant abdominal pain. Has used tums with relief. She does not feel the gerd causes a cough.    Assessment  1. Personal history of COVID-19   2. Cough   3. Panic disorder   4. Gastroesophageal reflux disease, unspecified whether esophagitis present   5. Need for hepatitis B vaccination      Plan  Covid/cough:  related and resolved.  Panic: continue to work on coping strategies. Has had psych and therapy in past. Seeing psych now.  GERD: start protonix 20 daily.    Follow up: as scheduled.   03/06/2021  Orders Placed This Encounter  Procedures   Heplisav-B (HepB-CPG) Vaccine   Meds ordered this encounter  Medications   pantoprazole (PROTONIX) 20 MG tablet    Sig: Take 1 tablet (20 mg total) by mouth daily.    Dispense:  90 tablet    Refill:  1      I reviewed the patients updated PMH, FH, and SocHx.    Patient Active Problem List   Diagnosis Date Noted   Bipolar 2 disorder (HCC) 09/19/2019    Priority: High   Fibromyalgia 09/09/2019    Priority: High   Panic disorder 06/01/2019    Priority: High   Family history of colon cancer 05/16/2019    Priority: High   Major depression, recurrent, chronic (HCC) 05/16/2019    Priority: High    Insomnia due to other mental disorder 03/13/2019    Priority: High   Acquired iron deficiency anemia due to decreased absorption 02/22/2015    Priority: High   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    Priority: High   Vitamin B12 deficiency 06/01/2019    Priority: Medium   Chronic constipation 05/16/2019    Priority: Medium   Pain in both lower extremities 11/21/2018    Priority: Medium   Nocturnal leg cramps 10/26/2018    Priority: Medium   Nephrolithiasis     Priority: Medium   Binge eating disorder 04/23/2017    Priority: Medium   Vitamin D deficiency 06/01/2019    Priority: Low   Reduced libido, due to Lexapro 07/09/2017    Priority: Low   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   Plantar fasciitis 10/31/2019   Ischial bursitis of left side 07/19/2019   Current Meds  Medication Sig   acetaminophen (TYLENOL) 500 MG tablet Take 2 tablets (1,000 mg total) by mouth every 6 (six) hours as needed for moderate pain.   albuterol (VENTOLIN HFA) 108 (90 Base) MCG/ACT inhaler Inhale 2 puffs into the lungs every 4 (four) hours as needed for wheezing or shortness of breath.    baclofen (LIORESAL) 10 MG tablet Take 1 tablet (10 mg total) by  mouth 3 (three) times daily as needed for muscle spasms.   busPIRone (BUSPAR) 15 MG tablet Take 15 mg by mouth 3 (three) times daily.   CALCIUM PO Take 1 tablet by mouth daily.   clonazePAM (KLONOPIN) 1 MG tablet Take 0.5 tablets (0.5 mg total) by mouth 2 (two) times daily as needed for anxiety.   clotrimazole (MYCELEX) 10 MG troche Take 10 mg by mouth 5 (five) times daily.   cyanocobalamin (,VITAMIN B-12,) 1000 MCG/ML injection Inject 1 vial per week for 3 weeks, then 1 vial per month thereafter   diclofenac Sodium (VOLTAREN) 1 % GEL Apply 4 g topically 4 (four) times daily. (Patient taking differently: Apply 4 g topically 4 (four) times daily as needed (pain).)   docusate sodium (COLACE) 100 MG capsule Take 1 capsule (100  mg total) by mouth 2 (two) times daily.   lamoTRIgine (LAMICTAL) 150 MG tablet Take 300 mg by mouth daily.    lithium carbonate (LITHOBID) 300 MG CR tablet Take 600 mg by mouth at bedtime.   Magnesium 500 MG TABS Take 1 tablet by mouth daily.   metFORMIN (GLUCOPHAGE) 500 MG tablet Take 500 mg by mouth 2 (two) times daily.   ondansetron (ZOFRAN) 4 MG tablet Take 1 tablet (4 mg total) by mouth every 6 (six) hours.   pantoprazole (PROTONIX) 20 MG tablet Take 1 tablet (20 mg total) by mouth daily.   pregabalin (LYRICA) 200 MG capsule Take 1 capsule (200 mg total) by mouth 2 (two) times daily.   QUEtiapine (SEROQUEL) 400 MG tablet Take 400 mg by mouth at bedtime.    QUEtiapine (SEROQUEL) 50 MG tablet Take by mouth.   SYRINGE-NEEDLE, DISP, 3 ML 25G X 1" 3 ML MISC Use 1 needle per application once weekly   tizanidine (ZANAFLEX) 2 MG capsule Take 2 capsules (4 mg total) by mouth 3 (three) times daily as needed for muscle spasms.   valACYclovir (VALTREX) 1000 MG tablet Take 1 tablet (1,000 mg total) by mouth 3 (three) times daily. (Patient taking differently: Take 1,000 mg by mouth 3 (three) times daily as needed (cold sores).)   Vitamin D, Ergocalciferol, (DRISDOL) 1.25 MG (50000 UT) CAPS capsule Take 50,000 Units by mouth once a week. Fridays    Allergies: Patient is allergic to nsaids, other, and zolpidem. Family History: Patient family history includes Anxiety disorder in her father and mother; Bipolar disorder in her father; Cancer in her father; Colon cancer (age of onset: 68) in her father; Depression in her father and mother; Diabetes in her maternal grandfather; Hyperlipidemia in her father; Hypertension in her father, maternal grandmother, and mother; Kidney disease in her father; Obesity in her father and mother; Sleep apnea in her father; Uterine cancer (age of onset: 63) in her maternal grandmother. Social History:  Patient  reports that she has never smoked. She has never used smokeless  tobacco. She reports current alcohol use. She reports that she does not use drugs.  Review of Systems: Constitutional: Negative for fever malaise or anorexia Cardiovascular: negative for chest pain Respiratory: negative for SOB or persistent cough Gastrointestinal: negative for abdominal pain  Objective  Vitals: BP 124/82 (BP Location: Left Arm, Patient Position: Sitting, Cuff Size: Large)   Pulse 95   Temp 98.5 F (36.9 C) (Temporal)   Ht 5\' 4"  (1.626 m)   Wt 279 lb (126.6 kg)   SpO2 98%   BMI 47.89 kg/m  General: no acute distress , A&Ox3 HEENT: PEERL, conjunctiva normal, neck is supple  Cardiovascular:  RRR without murmur or gallop.  Respiratory:  Good breath sounds bilaterally, CTAB with normal respiratory effort Gastrointestinal: soft, flat abdomen, normal active bowel sounds, no palpable masses, no hepatosplenomegaly, no appreciated hernias     Commons side effects, risks, benefits, and alternatives for medications and treatment plan prescribed today were discussed, and the patient expressed understanding of the given instructions. Patient is instructed to call or message via MyChart if he/she has any questions or concerns regarding our treatment plan. No barriers to understanding were identified. We discussed Red Flag symptoms and signs in detail. Patient expressed understanding regarding what to do in case of urgent or emergency type symptoms.  Medication list was reconciled, printed and provided to the patient in AVS. Patient instructions and summary information was reviewed with the patient as documented in the AVS. This note was prepared with assistance of Dragon voice recognition software. Occasional wrong-word or sound-a-like substitutions may have occurred due to the inherent limitations of voice recognition software  This visit occurred during the SARS-CoV-2 public health emergency.  Safety protocols were in place, including screening questions prior to the visit,  additional usage of staff PPE, and extensive cleaning of exam room while observing appropriate contact time as indicated for disinfecting solutions.

## 2020-12-14 NOTE — Patient Instructions (Signed)
Please return in 3-6 months for your annual complete physical; please come fasting.   If you have any questions or concerns, please don't hesitate to send me a message via MyChart or call the office at [phone]. Thank you for visiting with us today! It's our pleasure caring for you.   

## 2020-12-26 DIAGNOSIS — F1910 Other psychoactive substance abuse, uncomplicated: Secondary | ICD-10-CM | POA: Diagnosis not present

## 2020-12-26 DIAGNOSIS — Z79899 Other long term (current) drug therapy: Secondary | ICD-10-CM | POA: Diagnosis not present

## 2020-12-26 DIAGNOSIS — F3181 Bipolar II disorder: Secondary | ICD-10-CM | POA: Diagnosis not present

## 2020-12-28 ENCOUNTER — Ambulatory Visit: Payer: BC Managed Care – PPO | Admitting: Family Medicine

## 2021-01-01 ENCOUNTER — Encounter: Payer: Self-pay | Admitting: Physical Therapy

## 2021-01-01 ENCOUNTER — Ambulatory Visit (INDEPENDENT_AMBULATORY_CARE_PROVIDER_SITE_OTHER): Payer: BC Managed Care – PPO | Admitting: Physical Therapy

## 2021-01-01 ENCOUNTER — Other Ambulatory Visit: Payer: Self-pay

## 2021-01-01 DIAGNOSIS — G8929 Other chronic pain: Secondary | ICD-10-CM | POA: Diagnosis not present

## 2021-01-01 DIAGNOSIS — M545 Low back pain, unspecified: Secondary | ICD-10-CM | POA: Diagnosis not present

## 2021-01-01 DIAGNOSIS — M25551 Pain in right hip: Secondary | ICD-10-CM

## 2021-01-01 DIAGNOSIS — M25552 Pain in left hip: Secondary | ICD-10-CM | POA: Diagnosis not present

## 2021-01-01 NOTE — Patient Instructions (Signed)
Access Code: [email or web address] URL: [email or web address] Date: 01/01/2021 Prepared by: Sedalia Muta  Exercises Single Knee to Chest Stretch - 2 x daily - 3 reps - 30 hold Supine Piriformis Stretch Pulling Heel to Hip - 2 x daily - 3 reps - 30 hold Seated Piriformis Stretch with Trunk Bend - 2 x daily - 3 reps - 30 hold Seated Plantar Fascia Stretch - 2 x daily - 3 reps - 30 hold Sidelying Hip Abduction - 1 x daily - 1-2 sets - 10 reps Gastroc Stretch on Wall - 2 x daily - 3 reps - 30 hold

## 2021-01-02 NOTE — Progress Notes (Deleted)
   I, Christoper Fabian, LAT, ATC, am serving as scribe for Dr. Clementeen Graham.  [name] [name] is a 35 y.o. female who presents to [facility] today for f/u of B hip and groin pain thought to be due to hip abductor tendinopathy/greater trochanteric bursitis.  She was last seen by Dr. Denyse Amass on 11/15/20 and was referred to PT of which she has completed one session.  She was also prescribed Tizanidine.  Since her last visit, pt reports    Pertinent review of systems: ***  Relevant historical information: ***   Exam:  There were no vitals taken for this visit. General: Well Developed, well nourished, and in no acute distress.   MSK: ***    Lab and Radiology Results No results found for this or any previous visit (from the past 72 hour(s)). No results found.     Assessment and Plan: 35 y.o. female with ***   PDMP not reviewed this encounter. No orders of the defined types were placed in this encounter.  No orders of the defined types were placed in this encounter.    Discussed warning signs or symptoms. Please see discharge instructions. Patient expresses understanding.   ***

## 2021-01-03 ENCOUNTER — Ambulatory Visit: Payer: BC Managed Care – PPO | Admitting: Family Medicine

## 2021-01-07 ENCOUNTER — Encounter: Payer: Self-pay | Admitting: Physical Therapy

## 2021-01-07 NOTE — Therapy (Signed)
[facility] [address] [place], [place], [id number] Phone: [phone]   Fax:  [phone]  Physical Therapy Evaluation  Patient Details  Name: [name] [name] MRN: [id number] Date of Birth: 04/14/1986 Referring Provider (PT): Clementeen GrahamEvan Corey   Encounter Date: 01/01/2021   PT End of Session - 01/07/21 0906     Visit Number 1    Number of Visits 12    Date for PT Re-Evaluation 02/12/21    Authorization Type BCBS    PT Start Time 0803    PT Stop Time 0845    PT Time Calculation (min) 42 min    Activity Tolerance Patient tolerated treatment well    Behavior [facility] for tasks assessed/performed             Past Medical History:  Diagnosis Date   ADD (attention deficit disorder)    Anemia    Anxiety    B12 deficiency    Back pain    Bilateral swelling of feet    Bipolar 2 disorder (HCC) 09/19/2019   Constipation    DEPRESSION    Elevated cholesterol    Family history of colon cancer 05/16/2019   Father, 1050s; deceased.    Fatty liver    Fibromyalgia    Gallbladder problem    GERD (gastroesophageal reflux disease)    Heartburn    History of stomach ulcers    Hypertension    Hypothyroidism    Hx of, normalized TSH during pregnancy 2011   Infertility, female    Lactose intolerance    Migraines    Morbid obesity (HCC)    s/p RY 08/2012 - start weight 290 pounds   Nephrolithiasis    Palpitations    Panic disorder    PCOS (polycystic ovarian syndrome)    PONV (postoperative nausea and vomiting)    Pregnancy induced hypertension    Shortness of breath    Vitamin D deficiency     Past Surgical History:  Procedure Laterality Date   Quintella ReichertIKEN OSTEOTOMY Left 06/24/2017   Procedure: Quintella ReichertAIKEN OSTEOTOMY;  Surgeon: Vivi BarrackWagoner, Matthew R, DPM;  Location: [facility];  Service: Podiatry;  Laterality: Left;   BUNIONECTOMY Left 06/24/2017   Procedure: Anthoney HaradaALSTON BUNIONECTOMY;  Surgeon: Vivi BarrackWagoner, Matthew R, DPM;   Location:  SURGERY CENTER;  Service: Podiatry;  Laterality: Left;   CESAREAN SECTION     x 2   CHOLECYSTECTOMY N/A 02/15/2019   Procedure: LAPAROSCOPIC CHOLECYSTECTOMY WITH INTRAOPERATIVE CHOLANGIOGRAM;  Surgeon: Andria MeuseWhite, Christopher M, MD;  Location: [facility];  Service: General;  Laterality: N/A;   COLON RESECTION N/A 05/04/2020   Procedure: DIAGNOSTIC LAPAROSCOPY, RESECTION OF CANDYCANE, UPPER ENDOSCOPY;  Surgeon: Berna Bueonnor, Chelsea A, MD;  Location: [facility];  Service: General;  Laterality: N/A;   FOOT SURGERY     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   Procedure: LAPAROSCOPIC ROUX-EN-Y GASTRIC;  Surgeon: Atilano InaEric M Wilson, MD;  Location: [facility];  Service: General;  Laterality: N/A;  laparoscopic roux-en-y gastric bypass   LITHOTRIPSY Left    TONSILLECTOMY     TUBAL LIGATION     UPPER GI ENDOSCOPY  08/24/2012   Procedure: UPPER GI ENDOSCOPY;  Surgeon: Atilano InaEric M Wilson, MD;  Location: [facility];  Service: General;;   WISDOM TOOTH EXTRACTION      There were no vitals filed for this visit.    Subjective Assessment - 01/07/21 0902     Subjective Pt states ongoing Back pain, cervical, thoracic, lumbar, and hip pain. Chronic back  pain,  hip pain, 7 months.  No position comofortable.  Did have Also states ongoing pl fascia, has orthotics, will bring next time. She normally works as Investment banker, corporate, out for summer.    Limitations Lifting;Standing;Writing;House hold activities    Patient Stated Goals decreased pain    Currently in Pain? Yes    Pain Score 6     Pain Location Hip    Pain Orientation Right;Left    Pain Descriptors / Indicators Aching    Pain Type Chronic pain    Pain Onset More than a month ago    Pain Frequency Intermittent    Pain Relieving Factors hip ER    Pain Score 7    Pain Location Back    Pain Orientation Right;Left;Upper;Mid;Lower    Pain Descriptors / Indicators Aching    Pain Type Chronic pain    Pain Onset More than a month ago    Pain Frequency Intermittent                 [facility] PT Assessment - 01/07/21 0001       Assessment   Medical Diagnosis Bilateral Hip pain    Referring Provider (PT) Clementeen Graham    Prior Therapy no      Precautions   Precautions None      Balance Screen   Has the patient fallen in the past 6 months No      Prior Function   Level of Independence Independent      Cognition   Overall Cognitive Status Within Functional Limits for tasks assessed      ROM / Strength   AROM / PROM / Strength AROM;Strength      AROM   Overall AROM Comments lumbar: mild limiations for flex, ext and rot;  Hips: WNL;      Strength   Overall Strength Comments Hips: 4-/5,      Palpation   Palpation comment Thoracic and lumbar spine tender with light PAs and palaption, tenderness into Bil SI and glutes.      Special Tests   Other special tests Neg SLR,                        Objective measurements completed on examination: See above findings.       [facility] Adult PT Treatment/Exercise - 01/07/21 0001       Exercises   Exercises Lumbar      Lumbar Exercises: Stretches   Single Knee to Chest Stretch 3 reps;30 seconds    Piriformis Stretch 2 reps;30 seconds    Piriformis Stretch Limitations supine an seated    Gastroc Stretch 3 reps;30 seconds    Other Lumbar Stretch Exercise seated pl fasica stretch for HEP x 1 min;      Lumbar Exercises: Sidelying   Hip Abduction 10 reps;Both                    PT Education - 01/07/21 0906     Education Details PT POC, Exam findings, HEP    Person(s) Educated Patient    Methods Explanation;Demonstration;Tactile cues;Verbal cues;Handout    Comprehension Verbalized understanding;Returned demonstration;Verbal cues required;Need further instruction;Tactile cues required              PT Short Term Goals - 01/07/21 0913       PT SHORT TERM GOAL #1   Title Pt to be independent with initial HEP    Time 2  Period Weeks    Status New    Target Date 01/15/21                PT Long Term Goals - 01/07/21 0913       PT LONG TERM GOAL #1   Title Pt to be independent with final HEP    Time 6    Period Weeks    Status New    Target Date 02/12/21      PT LONG TERM GOAL #2   Title Pt to report decreased pain in hips and back to 0-2/10 with activity    Time 6    Period Weeks    Status New    Target Date 02/12/21      PT LONG TERM GOAL #3   Title Pt to demo improved strength of Hips to at least 4+/5 to improve stability and pain    Time 6    Period Weeks    Status New    Target Date 02/12/21      PT LONG TERM GOAL #4   Title Pt to demo optimal mechanics for bend, lift, squat, to improve ability and safety with IADLs, work duties, and  child care.    Time 6    Period Weeks    Status New    Target Date 02/12/21                    Plan - 01/07/21 0935     Clinical Impression Statement Pt presents with ongoing pain in multiple locations, neck, back, and hips, as well as bil feet. Main focus will be LBP and hip pain. She has tenderness with palpation to entire lumbar spine , SI, into bil glutes and hips. She has weakness in hips and core, and lack of effective HEP for her Dx. Pt with decreased ability for full functional activities, due to pain, and will benefit from skilled care to improve deficits, pain, and mobility.    Examination-Activity Limitations Lift;Bend;Sit;Stand;Stairs;Squat;Sleep;Carry    Examination-Participation Restrictions Cleaning;Meal Prep;Yard Work;Community Activity;Shop;Laundry;Occupation    Stability/Clinical Decision Making Stable/Uncomplicated    Clinical Decision Making Low    Rehab Potential Good    PT Frequency 2x / week    PT Duration 6 weeks    PT Treatment/Interventions ADLs/Self Care Home Management;Cryotherapy;Electrical Stimulation;DME Instruction;Ultrasound;Traction;Moist Heat;Iontophoresis 4mg /ml Dexamethasone;Gait training;Stair training;Functional mobility training;Therapeutic  activities;Therapeutic exercise;Patient/family education;Neuromuscular re-education;Manual techniques;Passive range of motion;Dry needling;Energy conservation;Spinal Manipulations;Joint Manipulations;Taping    PT Home Exercise Plan 02/12/2021    Consulted and Agree with Plan of Care Patient             Patient will benefit from skilled therapeutic intervention in order to improve the following deficits and impairments:  Pain, Improper body mechanics, Increased muscle spasms, Decreased mobility, Decreased activity tolerance, Decreased strength, Decreased range of motion, Impaired flexibility  Visit Diagnosis: Pain in left hip  Chronic bilateral low back pain without sciatica  Pain in right hip     Problem List Patient Active Problem List   Diagnosis Date Noted   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   Plantar fasciitis 10/31/2019   Bipolar 2 disorder (HCC) 09/19/2019   Fibromyalgia 09/09/2019   Ischial bursitis of left side 07/19/2019   Vitamin B12 deficiency 06/01/2019   Vitamin D deficiency 06/01/2019   Panic disorder 06/01/2019   Family history of colon cancer 05/16/2019   Chronic constipation 05/16/2019   Major depression, recurrent, chronic (HCC) 05/16/2019   Insomnia due to other mental  disorder 03/13/2019   Pain in both lower extremities 11/21/2018   Nocturnal leg cramps 10/26/2018   Nephrolithiasis    Reduced libido, due to Lexapro 07/09/2017   Binge eating disorder 04/23/2017   Acquired iron deficiency anemia due to decreased absorption 02/22/2015   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  Sedalia Muta, PT, DPT 9:43 AM  01/07/21    [facility] [address] [place], [place], [address] Phone: [phone]   Fax:  [phone]  Name: [name] [name] MRN: [id number] Date of Birth: November 20, 1985

## 2021-01-09 ENCOUNTER — Encounter: Payer: Self-pay | Admitting: Family Medicine

## 2021-01-09 ENCOUNTER — Other Ambulatory Visit: Payer: Self-pay

## 2021-01-09 MED ORDER — CLONAZEPAM 1 MG PO TABS: 0.5000 mg | ORAL_TABLET | Freq: Two times a day (BID) | ORAL | 5 refills | Status: DC | PRN

## 2021-01-09 NOTE — Telephone Encounter (Signed)
Last refill: 11/06/20 #60, 5 Last OV: 12/14/20 dx. Covid cough  Last script was not sent correctly, patient needing new script sent in

## 2021-01-09 NOTE — Telephone Encounter (Signed)
Dr. Mardelle Matte, Rx for Clonazepam never went to pharmacy. Please send I have loaded in the cart. Thanks

## 2021-01-10 ENCOUNTER — Ambulatory Visit (INDEPENDENT_AMBULATORY_CARE_PROVIDER_SITE_OTHER): Payer: BC Managed Care – PPO | Admitting: Physical Therapy

## 2021-01-10 ENCOUNTER — Encounter: Payer: Self-pay | Admitting: Physical Therapy

## 2021-01-10 ENCOUNTER — Other Ambulatory Visit: Payer: Self-pay

## 2021-01-10 DIAGNOSIS — G8929 Other chronic pain: Secondary | ICD-10-CM

## 2021-01-10 DIAGNOSIS — M25551 Pain in right hip: Secondary | ICD-10-CM

## 2021-01-10 DIAGNOSIS — M545 Low back pain, unspecified: Secondary | ICD-10-CM | POA: Diagnosis not present

## 2021-01-10 DIAGNOSIS — M25552 Pain in left hip: Secondary | ICD-10-CM

## 2021-01-10 NOTE — Therapy (Signed)
[facility] [address] [place], [place], [address] Phone: [phone]   Fax:  [phone]  Physical Therapy Treatment  Patient Details  Name: [name] [name] MRN: [id number] Date of Birth: 12-05-1985 Referring Provider (PT): Clementeen Graham   Encounter Date: 01/10/2021   PT End of Session - 01/10/21 0807     Visit Number 2    Number of Visits 12    Date for PT Re-Evaluation 02/12/21    Authorization Type BCBS    PT Start Time 0803    PT Stop Time 0845    PT Time Calculation (min) 42 min    Activity Tolerance Patient tolerated treatment well    Behavior During Therapy [facility] for tasks assessed/performed             Past Medical History:  Diagnosis Date   ADD (attention deficit disorder)    Anemia    Anxiety    B12 deficiency    Back pain    Bilateral swelling of feet    Bipolar 2 disorder (HCC) 09/19/2019   Constipation    DEPRESSION    Elevated cholesterol    Family history of colon cancer 2019/05/31   Father, 36s; deceased.    Fatty liver    Fibromyalgia    Gallbladder problem    GERD (gastroesophageal reflux disease)    Heartburn    History of stomach ulcers    Hypertension    Hypothyroidism    Hx of, normalized TSH during pregnancy 2011   Infertility, female    Lactose intolerance    Migraines    Morbid obesity (HCC)    s/p RY 08/2012 - start weight 290 pounds   Nephrolithiasis    Palpitations    Panic disorder    PCOS (polycystic ovarian syndrome)    PONV (postoperative nausea and vomiting)    Pregnancy induced hypertension    Shortness of breath    Vitamin D deficiency     Past Surgical History:  Procedure Laterality Date   Quintella Reichert OSTEOTOMY Left 06/24/2017   Procedure: Quintella Reichert OSTEOTOMY;  Surgeon: [name], DPM;  Location: [facility];  Service: Podiatry;  Laterality: Left;   BUNIONECTOMY Left 06/24/2017   Procedure: Anthoney Harada;  Surgeon: [name], DPM;   Location: [facility];  Service: Podiatry;  Laterality: Left;   CESAREAN SECTION     x 2   CHOLECYSTECTOMY N/A 02/15/2019   Procedure: LAPAROSCOPIC CHOLECYSTECTOMY WITH INTRAOPERATIVE CHOLANGIOGRAM;  Surgeon: Andria Meuse, MD;  Location: [facility];  Service: General;  Laterality: N/A;   COLON RESECTION N/A 05/04/2020   Procedure: DIAGNOSTIC LAPAROSCOPY, RESECTION OF CANDYCANE, UPPER ENDOSCOPY;  Surgeon: Berna Bue, MD;  Location: [facility];  Service: General;  Laterality: N/A;   FOOT SURGERY     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   Procedure: LAPAROSCOPIC ROUX-EN-Y GASTRIC;  Surgeon: Atilano Ina, MD;  Location: [facility];  Service: General;  Laterality: N/A;  laparoscopic roux-en-y gastric bypass   LITHOTRIPSY Left    TONSILLECTOMY     TUBAL LIGATION     UPPER GI ENDOSCOPY  08/24/2012   Procedure: UPPER GI ENDOSCOPY;  Surgeon: Atilano Ina, MD;  Location: [facility];  Service: General;;   WISDOM TOOTH EXTRACTION      There were no vitals filed for this visit.   Subjective Assessment - 01/10/21 0806     Subjective Pt states hip pain not too bad today because she slept on her back.  Did try stretches/HEP. Neck sore today    Currently in Pain? Yes    Pain Score 5     Pain Location Hip    Pain Orientation Right;Left    Pain Descriptors / Indicators Aching    Pain Type Chronic pain    Pain Onset More than a month ago    Pain Frequency Intermittent    Pain Score 4    Pain Location Back    Pain Orientation Right;Left    Pain Descriptors / Indicators Aching    Pain Type Chronic pain    Pain Onset More than a month ago    Pain Frequency Intermittent                               [facility] Adult PT Treatment/Exercise - 01/10/21 0001       Exercises   Exercises Lumbar      Lumbar Exercises: Stretches   Single Knee to Chest Stretch --    Piriformis Stretch 2 reps;30 seconds    Piriformis Stretch Limitations supine an seated    Gastroc Stretch --     Other Lumbar Stretch Exercise --      Lumbar Exercises: Aerobic   Recumbent Bike L1 x 6 min ;      Lumbar Exercises: Standing   Row 20 reps    Theraband Level (Row) Level 3 (Green)      Lumbar Exercises: Seated   Sit to Stand 10 reps    Other Seated Lumbar Exercises Chin tucks seated and supine x 10 ea;      Lumbar Exercises: Supine   Clam 20 reps    Clam Limitations GTB    Bridge 20 reps    Straight Leg Raise 10 reps    Straight Leg Raises Limitations bil with TA      Lumbar Exercises: Sidelying   Hip Abduction 10 reps;Both      Manual Therapy   Manual Therapy Joint mobilization;Soft tissue mobilization;Manual Traction    Joint Mobilization PA mobs upper t-spine and lower c-spine; cervical side glides    Soft tissue mobilization manual UT and levator stretching    Manual Traction Cervical: 10 sec x 10;                      PT Short Term Goals - 01/07/21 0913       PT SHORT TERM GOAL #1   Title Pt to be independent with initial HEP    Time 2    Period Weeks    Status New    Target Date 01/15/21               PT Long Term Goals - 01/07/21 0913       PT LONG TERM GOAL #1   Title Pt to be independent with final HEP    Time 6    Period Weeks    Status New    Target Date 02/12/21      PT LONG TERM GOAL #2   Title Pt to report decreased pain in hips and back to 0-2/10 with activity    Time 6    Period Weeks    Status New    Target Date 02/12/21      PT LONG TERM GOAL #3   Title Pt to demo improved strength of Hips to at least 4+/5 to improve stability and pain    Time 6  Period Weeks    Status New    Target Date 02/12/21      PT LONG TERM GOAL #4   Title Pt to demo optimal mechanics for bend, lift, squat, to improve ability and safety with IADLs, work duties, and  child care.    Time 6    Period Weeks    Status New    Target Date 02/12/21                   Plan - 01/10/21 0825     Clinical Impression Statement  Assessed current footwear, and recommended pt update shoes but continue to wear stability sneaker. Current shoes/inserts are causing over correction into supination on R. Pt with increased pain in c-t junction today, addressed with manual therapy/joint mobilizations as well as education on posture, forward head posture, and chin tucks added to HEP. Pt able to progress to light strengthening today without increased pain. Plan to continue to progress core and hip strength and stability as tolerated.    Examination-Activity Limitations Lift;Bend;Sit;Stand;Stairs;Squat;Sleep;Carry    Examination-Participation Restrictions Cleaning;Meal Prep;Yard Work;Community Activity;Shop;Laundry;Occupation    Stability/Clinical Decision Making Stable/Uncomplicated    Rehab Potential Good    PT Frequency 2x / week    PT Duration 6 weeks    PT Treatment/Interventions ADLs/Self Care Home Management;Cryotherapy;Electrical Stimulation;DME Instruction;Ultrasound;Traction;Moist Heat;Iontophoresis 4mg /ml Dexamethasone;Gait training;Stair training;Functional mobility training;Therapeutic activities;Therapeutic exercise;Patient/family education;Neuromuscular re-education;Manual techniques;Passive range of motion;Dry needling;Energy conservation;Spinal Manipulations;Joint Manipulations;Taping    PT Home Exercise Plan 02/12/2021    Consulted and Agree with Plan of Care Patient             Patient will benefit from skilled therapeutic intervention in order to improve the following deficits and impairments:  Pain, Improper body mechanics, Increased muscle spasms, Decreased mobility, Decreased activity tolerance, Decreased strength, Decreased range of motion, Impaired flexibility  Visit Diagnosis: Chronic bilateral low back pain without sciatica  Pain in right hip  Pain in left hip     Problem List Patient Active Problem List   Diagnosis Date Noted   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   Plantar  fasciitis 10/31/2019   Bipolar 2 disorder (HCC) 09/19/2019   Fibromyalgia 09/09/2019   Ischial bursitis of left side 07/19/2019   Vitamin B12 deficiency 06/01/2019   Vitamin D deficiency 06/01/2019   Panic disorder 06/01/2019   Family history of colon cancer 05/16/2019   Chronic constipation 05/16/2019   Major depression, recurrent, chronic (HCC) 05/16/2019   Insomnia due to other mental disorder 03/13/2019   Pain in both lower extremities 11/21/2018   Nocturnal leg cramps 10/26/2018   Nephrolithiasis    Reduced libido, due to Lexapro 07/09/2017   Binge eating disorder 04/23/2017   Acquired iron deficiency anemia due to decreased absorption 02/22/2015   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 [place], [place], [place] Phone: [phone]   Fax:  [phone]  Name: [name] [name] MRN: [name] Date of Birth: 16-Apr-1986

## 2021-01-14 ENCOUNTER — Other Ambulatory Visit: Payer: Self-pay

## 2021-01-14 ENCOUNTER — Ambulatory Visit (INDEPENDENT_AMBULATORY_CARE_PROVIDER_SITE_OTHER): Payer: BC Managed Care – PPO | Admitting: Physical Therapy

## 2021-01-14 ENCOUNTER — Encounter: Payer: Self-pay | Admitting: Physical Therapy

## 2021-01-14 DIAGNOSIS — G8929 Other chronic pain: Secondary | ICD-10-CM | POA: Diagnosis not present

## 2021-01-14 DIAGNOSIS — M25551 Pain in right hip: Secondary | ICD-10-CM | POA: Diagnosis not present

## 2021-01-14 DIAGNOSIS — M25552 Pain in left hip: Secondary | ICD-10-CM

## 2021-01-14 DIAGNOSIS — M545 Low back pain, unspecified: Secondary | ICD-10-CM

## 2021-01-14 NOTE — Therapy (Addendum)
[facility] [address] [place], [place], [address] Phone: [phone]   Fax:  [phone]  Physical Therapy Treatment  Patient Details  Name: [name] [name] MRN: [id number] Date of Birth: 08/21/1985 Referring Provider (PT): Lynne Leader   Encounter Date: 01/14/2021   PT End of Session - 01/14/21 0847     Visit Number 3    Number of Visits 12    Date for PT Re-Evaluation 02/12/21    Authorization Type BCBS    PT Start Time 0840    PT Stop Time 0924    PT Time Calculation (min) 44 min    Activity Tolerance Patient tolerated treatment well    Behavior During Therapy [facility] for tasks assessed/performed             Past Medical History:  Diagnosis Date   ADD (attention deficit disorder)    Anemia    Anxiety    B12 deficiency    Back pain    Bilateral swelling of feet    Bipolar 2 disorder ([place]) 09/19/2019   Constipation    DEPRESSION    Elevated cholesterol    Family history of colon cancer 06/05/2019   Father, 86s; deceased.    Fatty liver    Fibromyalgia    Gallbladder problem    GERD (gastroesophageal reflux disease)    Heartburn    History of stomach ulcers    Hypertension    Hypothyroidism    Hx of, normalized TSH during pregnancy 2011   Infertility, female    Lactose intolerance    Migraines    Morbid obesity ([place])    s/p RY 08/2012 - start weight 290 pounds   Nephrolithiasis    Palpitations    Panic disorder    PCOS (polycystic ovarian syndrome)    PONV (postoperative nausea and vomiting)    Pregnancy induced hypertension    Shortness of breath    Vitamin D deficiency     Past Surgical History:  Procedure Laterality Date   Barbie Banner OSTEOTOMY Left 06/24/2017   Procedure: Barbie Banner OSTEOTOMY;  Surgeon: Trula Slade, DPM;  Location: [place];  Service: Podiatry;  Laterality: Left;   BUNIONECTOMY Left 06/24/2017   Procedure: Annye English;  Surgeon: Trula Slade, DPM;  Location:  [place];  Service: Podiatry;  Laterality: Left;   CESAREAN SECTION     x 2   CHOLECYSTECTOMY N/A 02/15/2019   Procedure: LAPAROSCOPIC CHOLECYSTECTOMY WITH INTRAOPERATIVE CHOLANGIOGRAM;  Surgeon: Ileana Roup, MD;  Location[facility];  Service: General;  Laterality: N/A;   COLON RESECTION N/A 05/04/2020   Procedure: DIAGNOSTIC LAPAROSCOPY, RESECTION OF CANDYCANE, UPPER ENDOSCOPY;  Surgeon: Clovis Riley, MD;  Location: [facility];  Service: General;  Laterality: N/A;   FOOT SURGERY     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   Procedure: LAPAROSCOPIC ROUX-EN-Y GASTRIC;  Surgeon: Gayland Curry, MD;  Location: [facility];  Service: General;  Laterality: N/A;  laparoscopic roux-en-y gastric bypass   LITHOTRIPSY Left    TONSILLECTOMY     TUBAL LIGATION     UPPER GI ENDOSCOPY  08/24/2012   Procedure: UPPER GI ENDOSCOPY;  Surgeon: Gayland Curry, MD;  Location: [facility];  Service: General;;   WISDOM TOOTH EXTRACTION      There were no vitals filed for this visit.   Subjective Assessment - 01/14/21 0845     Subjective Pt states less pain in back, hips were sore over the weekend. Neck still  most sore today. Has been doing HEP.    Currently in Pain? Yes    Pain Score 2     Pain Location Hip    Pain Orientation Right;Left    Pain Descriptors / Indicators Aching    Pain Type Chronic pain    Pain Onset More than a month ago    Pain Frequency Intermittent    Pain Score 7    Pain Location Neck    Pain Orientation Right;Left    Pain Descriptors / Indicators Aching    Pain Type Chronic pain    Pain Onset More than a month ago    Pain Frequency Intermittent                               [facility] Adult PT Treatment/Exercise - 01/14/21 0001       Exercises   Exercises Lumbar      Lumbar Exercises: Stretches   Piriformis Stretch 2 reps;30 seconds    Piriformis Stretch Limitations supine    Other Lumbar Stretch Exercise Childs pose 30 sec x 2 bil; Cat/Cow 5 sec x  15;      Lumbar Exercises: Aerobic   Recumbent Bike L2 x 7 min ;      Lumbar Exercises: Standing   Functional Squats 15 reps    Row 20 reps    Theraband Level (Row) Level 4 (Blue)      Lumbar Exercises: Seated   Sit to Stand 5 reps    Other Seated Lumbar Exercises Chin tucks seated  15 ea;      Lumbar Exercises: Supine   Clam 20 reps    Clam Limitations Blue TB    Bridge with clamshell 20 reps   Blue TB     Lumbar Exercises: Sidelying   Hip Abduction Both;20 reps      Manual Therapy   Manual Therapy Joint mobilization;Soft tissue mobilization;Manual Traction    Joint Mobilization PA mobs L, T, and C spine; cervical side glides,    Soft tissue mobilization manual UT and levator stretching    Manual Traction Cervical: 10 sec x 10;                      PT Short Term Goals - 01/07/21 0913       PT SHORT TERM GOAL #1   Title Pt to be independent with initial HEP    Time 2    Period Weeks    Status New    Target Date 01/15/21               PT Long Term Goals - 01/07/21 0913       PT LONG TERM GOAL #1   Title Pt to be independent with final HEP    Time 6    Period Weeks    Status New    Target Date 02/12/21      PT LONG TERM GOAL #2   Title Pt to report decreased pain in hips and back to 0-2/10 with activity    Time 6    Period Weeks    Status New    Target Date 02/12/21      PT LONG TERM GOAL #3   Title Pt to demo improved strength of Hips to at least 4+/5 to improve stability and pain    Time 6    Period Weeks    Status New  Target Date 02/12/21      PT LONG TERM GOAL #4   Title Pt to demo optimal mechanics for bend, lift, squat, to improve ability and safety with IADLs, work duties, and  child care.    Time 6    Period Weeks    Status New    Target Date 02/12/21                   Plan - 01/14/21 3704     Clinical Impression Statement Pt with most pain and tenderness in c/t junction. Soreness with PAs of c and t  spine. Discussed optimal head positioning, to decrease fwd head position and pain. Reviewed chin tucks and scap squeezes for help with posture. Pt did well with progressions of core strength and squats today, with minimal increased pain in hips or back. Pt to benefit from continued strengthening as tolerated.    Examination-Activity Limitations Lift;Bend;Sit;Stand;Stairs;Squat;Sleep;Carry    Examination-Participation Restrictions Cleaning;Meal Prep;Yard Work;Community Activity;Shop;Laundry;Occupation    Stability/Clinical Decision Making Stable/Uncomplicated    Rehab Potential Good    PT Frequency 2x / week    PT Duration 6 weeks    PT Treatment/Interventions ADLs/Self Care Home Management;Cryotherapy;Electrical Stimulation;DME Instruction;Ultrasound;Traction;Moist Heat;Iontophoresis 21m/ml Dexamethasone;Gait training;Stair training;Functional mobility training;Therapeutic activities;Therapeutic exercise;Patient/family education;Neuromuscular re-education;Manual techniques;Passive range of motion;Dry needling;Energy conservation;Spinal Manipulations;Joint Manipulations;Taping    PT Home Exercise Plan 02/12/2021    Consulted and Agree with Plan of Care Patient             Patient will benefit from skilled therapeutic intervention in order to improve the following deficits and impairments:  Pain, Improper body mechanics, Increased muscle spasms, Decreased mobility, Decreased activity tolerance, Decreased strength, Decreased range of motion, Impaired flexibility  Visit Diagnosis: Chronic bilateral low back pain without sciatica  Pain in right hip  Pain in left hip     Problem List Patient Active Problem List   Diagnosis Date Noted   Int[place]ception of [place] intestine ([place] 05/03/2020   Plantar fasciitis 10/31/2019   Bipolar 2 disorder ([place] 09/19/2019   Fibromyalgia 09/09/2019   Ischial bursitis of left side 07/19/2019   Vitamin B12 deficiency 06/01/2019   Vitamin D deficiency  06/01/2019   Panic disorder 06/01/2019   Family history of colon cancer 05/16/2019   Chronic constipation 05/16/2019   Major depression, recurrent, chronic ([place] 05/16/2019   Insomnia due to other mental disorder 03/13/2019   Pain in both lower extremities 11/21/2018   Nocturnal leg cramps 10/26/2018   Nephrolithiasis    Reduced libido, due to Lexapro 07/09/2017   Binge eating disorder 04/23/2017   Acquired iron deficiency anemia due to decreased absorption 02/22/2015   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   LLyndee Hensen PT, DPT 9:29 AM  01/14/21   [facility] [place] [address]Phone: [phone]  Fax:  [phone] Name: [name] FORTONMRN: [id number] of Birth: [birth year] PHYSICAL THERAPY DISCHARGE SUMMARY  Visits from Start of Care: 3 Plan: Patient agrees to discharge.  Patient goals were partially met. Patient is being discharged due to -pt did not return to PT.     LLyndee Hensen PT, DPT 12:05 PM  09/16/21

## 2021-01-15 ENCOUNTER — Ambulatory Visit: Payer: BC Managed Care – PPO | Admitting: Family Medicine

## 2021-01-18 NOTE — Progress Notes (Deleted)
   I, Christoper Fabian, LAT, ATC, am serving as scribe for Dr. Clementeen Graham.  [name] [name] is a 35 y.o. female who presents to [facility] today for f/u of B hip/groin pain and chronic pain syndrome.  She was last seen by Dr. Denyse Amass on 11/15/20 and was referred to PT of which she has completed 3 sessions.  She was also prescribed Tizanidine and a message was sent to Dr. Wynn Banker w/ pain management about possibly proceeding w an L3 ESI.  She had a L L5 dorsal ramus and L L3 and L4 medial branch radio frequency neurotomy on 11/27/20.  Since her last visit, pt reports   Diagnostic imaging: L-spine MRI- 07/16/19  Pertinent review of systems: ***  Relevant historical information: ***   Exam:  There were no vitals taken for this visit. General: Well Developed, well nourished, and in no acute distress.   MSK: ***    Lab and Radiology Results No results found for this or any previous visit (from the past 72 hour(s)). No results found.     Assessment and Plan: 35 y.o. female with ***   PDMP not reviewed this encounter. No orders of the defined types were placed in this encounter.  No orders of the defined types were placed in this encounter.    Discussed warning signs or symptoms. Please see discharge instructions. Patient expresses understanding.   ***

## 2021-01-19 ENCOUNTER — Encounter: Payer: Self-pay | Admitting: Family Medicine

## 2021-01-21 ENCOUNTER — Encounter: Payer: BC Managed Care – PPO | Admitting: Physical Therapy

## 2021-01-21 ENCOUNTER — Ambulatory Visit: Payer: BC Managed Care – PPO | Admitting: Family Medicine

## 2021-01-31 MED ORDER — CYCLOBENZAPRINE HCL 10 MG PO TABS: 10.0000 mg | ORAL_TABLET | Freq: Three times a day (TID) | ORAL | 0 refills | Status: DC | PRN

## 2021-02-05 ENCOUNTER — Emergency Department (HOSPITAL_BASED_OUTPATIENT_CLINIC_OR_DEPARTMENT_OTHER): Payer: BC Managed Care – PPO

## 2021-02-05 ENCOUNTER — Emergency Department (HOSPITAL_BASED_OUTPATIENT_CLINIC_OR_DEPARTMENT_OTHER)
Admission: EM | Admit: 2021-02-05 | Discharge: 2021-02-05 | Disposition: A | Discharge status: Home / Self Care | Payer: BC Managed Care – PPO | Attending: Emergency Medicine | Admitting: Emergency Medicine

## 2021-02-05 ENCOUNTER — Encounter (HOSPITAL_BASED_OUTPATIENT_CLINIC_OR_DEPARTMENT_OTHER): Payer: Self-pay

## 2021-02-05 DIAGNOSIS — R110 Nausea: Secondary | ICD-10-CM | POA: Diagnosis not present

## 2021-02-05 DIAGNOSIS — Z79899 Other long term (current) drug therapy: Secondary | ICD-10-CM | POA: Diagnosis not present

## 2021-02-05 DIAGNOSIS — R109 Unspecified abdominal pain: Secondary | ICD-10-CM | POA: Diagnosis not present

## 2021-02-05 DIAGNOSIS — K5900 Constipation, unspecified: Secondary | ICD-10-CM | POA: Diagnosis not present

## 2021-02-05 DIAGNOSIS — F3181 Bipolar II disorder: Secondary | ICD-10-CM | POA: Diagnosis not present

## 2021-02-05 DIAGNOSIS — I10 Essential (primary) hypertension: Secondary | ICD-10-CM | POA: Insufficient documentation

## 2021-02-05 DIAGNOSIS — F1910 Other psychoactive substance abuse, uncomplicated: Secondary | ICD-10-CM | POA: Diagnosis not present

## 2021-02-05 DIAGNOSIS — E039 Hypothyroidism, unspecified: Secondary | ICD-10-CM | POA: Insufficient documentation

## 2021-02-05 LAB — URINALYSIS, ROUTINE W REFLEX MICROSCOPIC
Bilirubin Urine: NEGATIVE
Glucose, UA: NEGATIVE mg/dL
Ketones, ur: NEGATIVE mg/dL
Nitrite: NEGATIVE
Specific Gravity, Urine: 1.024 (ref 1.005–1.030)
pH: 5 (ref 5.0–8.0)

## 2021-02-05 LAB — CBC
HCT: 39.2 % (ref 36.0–46.0)
Hemoglobin: 12.4 g/dL (ref 12.0–15.0)
MCH: 26.3 pg (ref 26.0–34.0)
MCHC: 31.6 g/dL (ref 30.0–36.0)
MCV: 83.1 fL (ref 80.0–100.0)
Platelets: 341 10*3/uL (ref 150–400)
RBC: 4.72 MIL/uL (ref 3.87–5.11)
RDW: 15.6 % — ABNORMAL HIGH (ref 11.5–15.5)
WBC: 10.3 10*3/uL (ref 4.0–10.5)
nRBC: 0 % (ref 0.0–0.2)

## 2021-02-05 LAB — BASIC METABOLIC PANEL
Anion gap: 11 (ref 5–15)
BUN: 13 mg/dL (ref 6–20)
CO2: 25 mmol/L (ref 22–32)
Calcium: 9.2 mg/dL (ref 8.9–10.3)
Chloride: 106 mmol/L (ref 98–111)
Creatinine, Ser: 0.78 mg/dL (ref 0.44–1.00)
GFR, Estimated: >60 mL/min (ref >60)
Glucose, Bld: 87 mg/dL (ref 70–99)
Potassium: 3.7 mmol/L (ref 3.5–5.1)
Sodium: 142 mmol/L (ref 135–145)

## 2021-02-05 LAB — PREGNANCY, URINE: Preg Test, Ur: NEGATIVE

## 2021-02-05 IMAGING — CT CT RENAL STONE PROTOCOL
2 of 5 series · 16 of 46 positions shown, 18 images · non-contrast
Comparison: [DATE], [DATE]

CLINICAL DATA: Left flank pain, nausea, abdominal pain

EXAM:
CT ABDOMEN AND PELVIS WITHOUT CONTRAST
TECHNIQUE: Multidetector CT imaging of the abdomen and pelvis was performed
following the standard protocol without IV contrast.

[Series 3: thins · axial · 0.96mm/px · z∈[+911,+1362]mm · 13 of 751 slices shown, 15 images]
[im 29/751  soft-tissue]
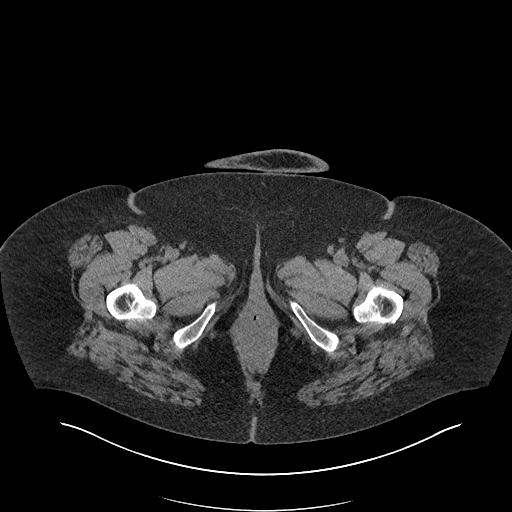
[im 29/751  bone]
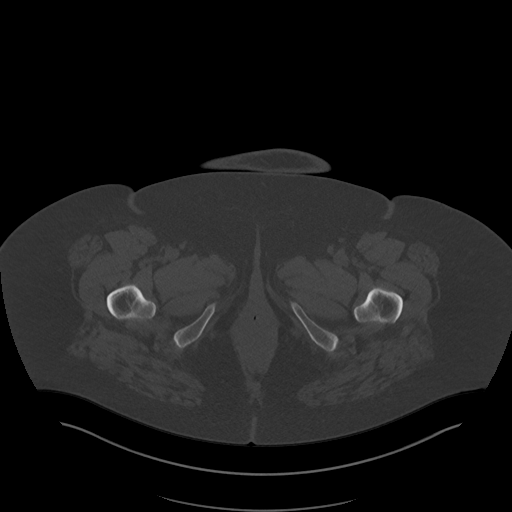
[im 87/751  soft-tissue]
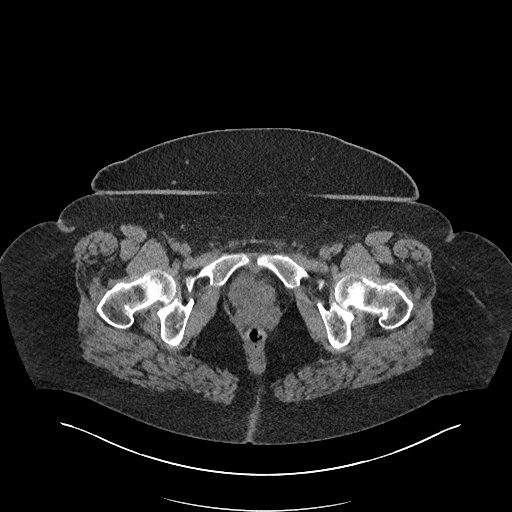
[im 145/751  soft-tissue]
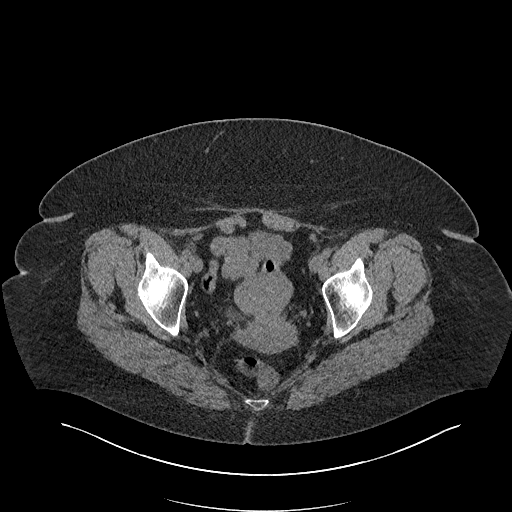
[im 202/751  soft-tissue]
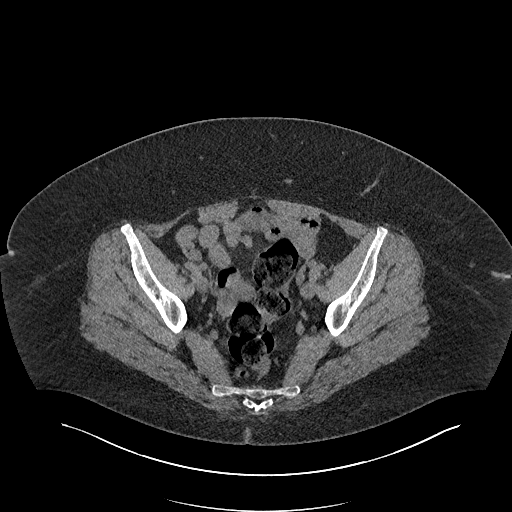
[im 260/751  soft-tissue]
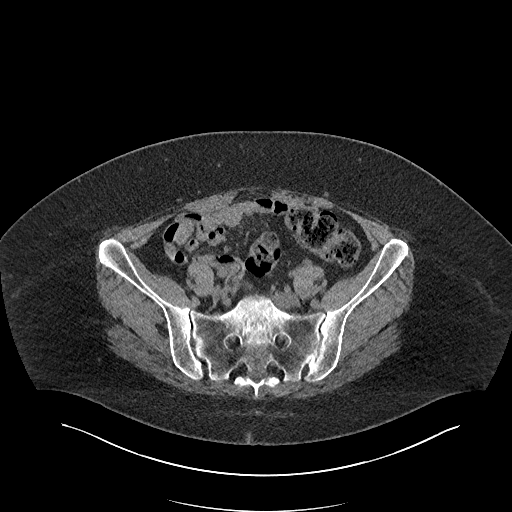
[im 318/751  soft-tissue]
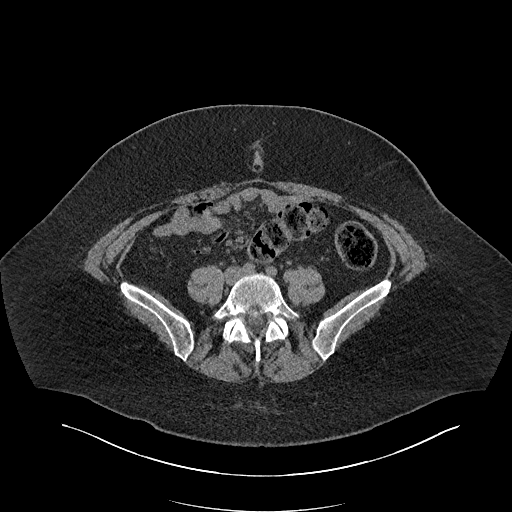
[im 376/751  soft-tissue]
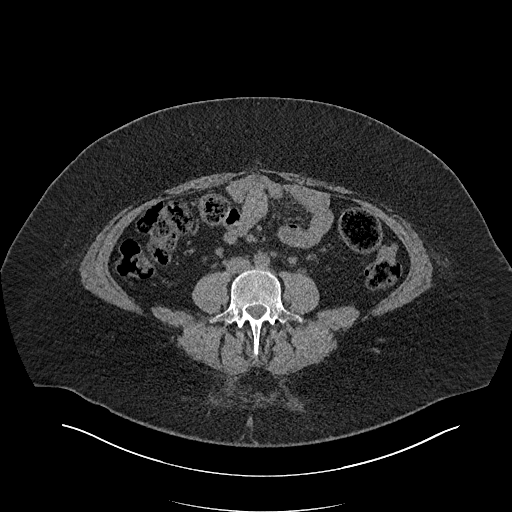
[im 433/751  soft-tissue]
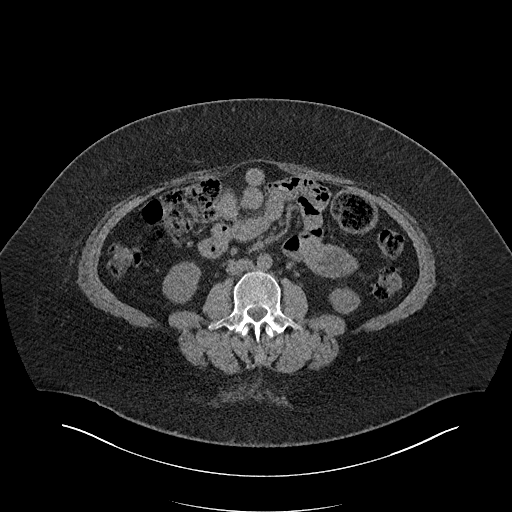
[im 491/751  soft-tissue]
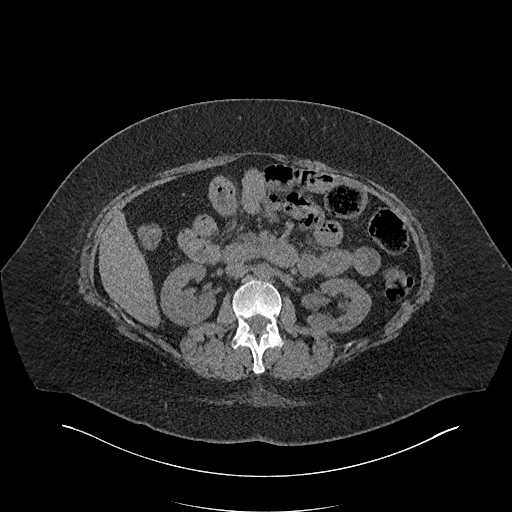
[im 491/751  bone]
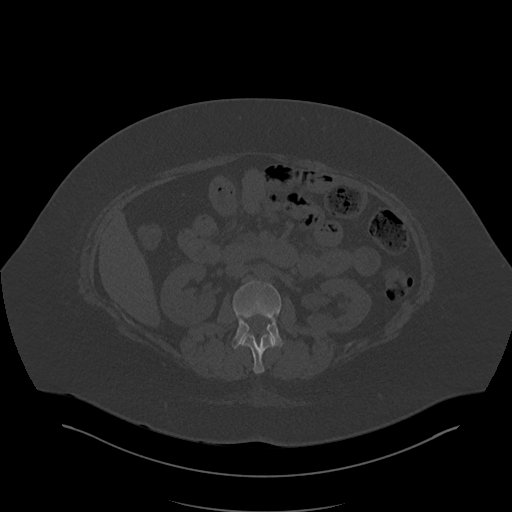
[im 549/751  soft-tissue]
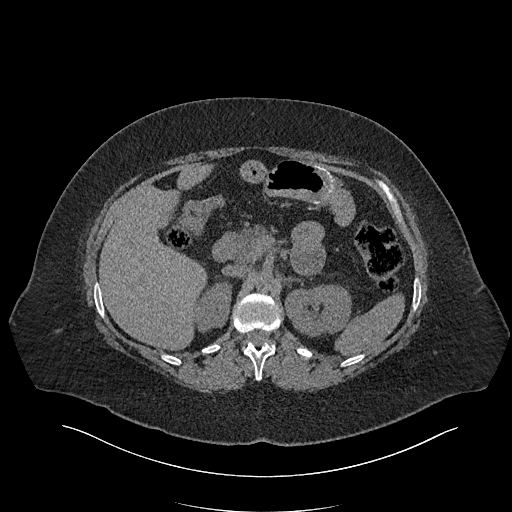
[im 606/751  soft-tissue]
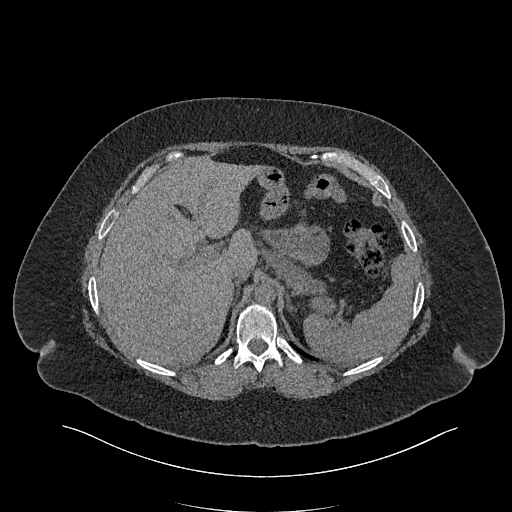
[im 664/751  soft-tissue]
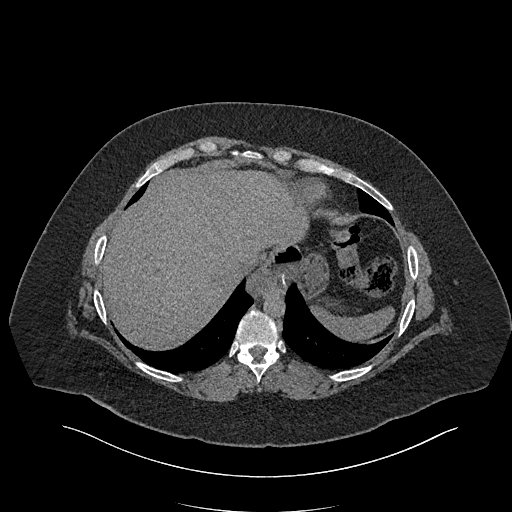
[im 722/751  soft-tissue]
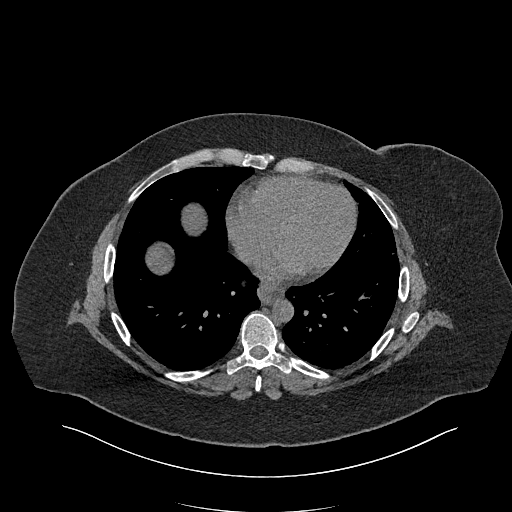

[Series 5: coronal · coronal · 0.96mm/px · 3 of 113 slices shown]
[im 38/113  soft-tissue]
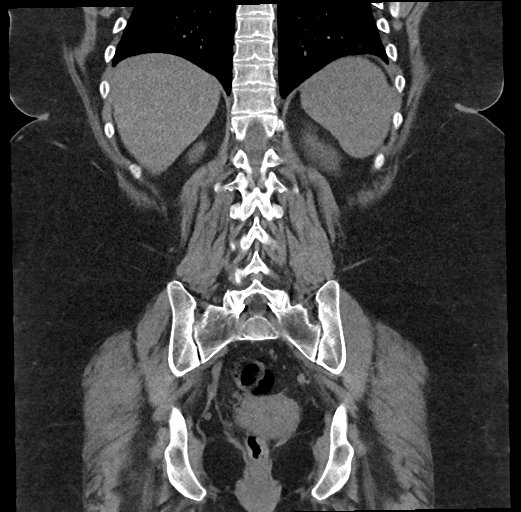
[im 50/113  soft-tissue]
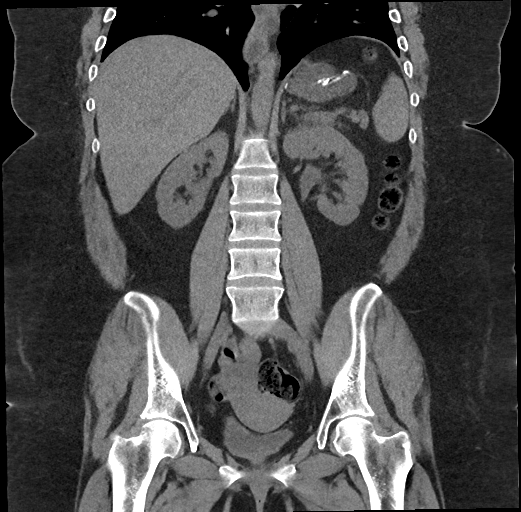
[im 63/113  soft-tissue]
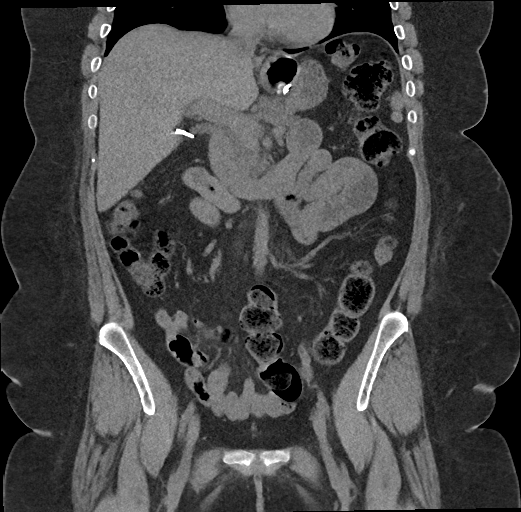

[16 of 46 positions shown; findings below may reference images not displayed]

FINDINGS: Lower chest: There is patchy ground-glass pulmonary infiltrate
within the visualized left lung base, possibly infectious in the
acute setting. No pleural effusion. The visualized heart and
pericardium are unremarkable. Small hiatal hernia noted.

Hepatobiliary: No focal liver abnormality is seen. Status post
cholecystectomy. No biliary dilatation.

Pancreas: There is heterogeneous fatty infiltration within the
pancreatic head which appears stable since immediate prior
examination, but is progressive since prior examinations of
[DATE] and [DATE]. There is, additionally, mild enlargement
of the pancreatic head, of unclear significance. The pancreas is
otherwise unremarkable.

Spleen: Unremarkable

Adrenals/Urinary Tract: Adrenal glands are unremarkable. Kidneys are
normal, without renal calculi, focal lesion, or hydronephrosis.
Bladder is unremarkable.

Stomach/Bowel: Surgical changes of Roux-en-Y gastric bypass are
identified. No evidence of obstruction or focal inflammation.
Moderate stool seen throughout the colon. The stomach, small bowel,
and large bowel are otherwise unremarkable. No free intraperitoneal
gas or fluid.

Vascular/Lymphatic: No significant vascular findings are present. No
enlarged abdominal or pelvic lymph nodes.

Reproductive: Uterus and bilateral adnexa are unremarkable.

Other: No abdominal wall hernia.  Rectum unremarkable.

Musculoskeletal: No lytic or blastic bone lesion.
IMPRESSION: Patchy pulmonary infiltrate within the visualized left lung base,
possibly infectious in the acute setting.

No acute intra-abdominal pathology.

Progressive enlargement of the pancreatic head with macroscopic
fatty infiltration. While this may simply represent asymmetric fatty
infiltration of the pancreatic head, the enlargement is unusual and
contrast enhanced MRI examination or EUS examination may be helpful
to exclude an underlying mass lesion.

Moderate stool within the colon without evidence of obstruction.

## 2021-02-05 MED ORDER — ONDANSETRON HCL 4 MG/2ML IJ SOLN
4.0000 mg | Freq: Once | INTRAMUSCULAR | Status: AC
  Administered 2021-02-05: 4 mg via INTRAVENOUS
  Filled 2021-02-05: qty 2

## 2021-02-05 MED ORDER — MORPHINE SULFATE (PF) 4 MG/ML IV SOLN
4.0000 mg | Freq: Once | INTRAVENOUS | Status: AC
  Administered 2021-02-05: 4 mg via INTRAVENOUS
  Filled 2021-02-05: qty 1

## 2021-02-05 MED ORDER — OXYCODONE-ACETAMINOPHEN 5-325 MG PO TABS: 1.0000 | ORAL_TABLET | Freq: Four times a day (QID) | ORAL | 0 refills | Status: DC | PRN

## 2021-02-05 MED ORDER — ACETAMINOPHEN 325 MG PO TABS
650.0000 mg | ORAL_TABLET | Freq: Once | ORAL | Status: AC
  Administered 2021-02-05: 650 mg via ORAL
  Filled 2021-02-05: qty 2

## 2021-02-05 NOTE — ED Triage Notes (Signed)
Patient arrives from home with c/o left flank pain with nausea and abd pain x48 hours. Denies v/d. Patient states she is having trouble urinating.   Patient has hx of kidney stones.

## 2021-02-05 NOTE — Discharge Instructions (Addendum)
Your CT scan shows no signs of kidney stone at this time.  It did incidentally show that your pancreas appears enlarged. follow-up with your doctor within the week regarding this finding.  Return back to the ER if you have fevers worsening symptoms or any additional concerns.

## 2021-02-05 NOTE — ED Notes (Signed)
Patient transported to CT 

## 2021-02-05 NOTE — ED Provider Notes (Signed)
[facility] EMERGENCY DEPT Provider Note   CSN: [id number] Arrival date & time: 02/05/21  2018     History Chief Complaint  Patient presents with   Flank Pain    Left   Nausea    [name] [name] is a 35 y.o. female.  Patient presents with left flank pain ongoing since yesterday.  Describes it as sharp and aching associate with nausea.  She states she has a history of kidney stones and feels similar.  Denies fevers or cough.  Denies vomiting or diarrhea.      Past Medical History:  Diagnosis Date   ADD (attention deficit disorder)    Anemia    Anxiety    B12 deficiency    Back pain    Bilateral swelling of feet    Bipolar 2 disorder (HCC) 09/19/2019   Constipation    DEPRESSION    Elevated cholesterol    Family history of colon cancer 05/25/2019   Father, 48s; deceased.    Fatty liver    Fibromyalgia    Gallbladder problem    GERD (gastroesophageal reflux disease)    Heartburn    History of stomach ulcers    Hypertension    Hypothyroidism    Hx of, normalized TSH during pregnancy 2011   Infertility, female    Lactose intolerance    Migraines    Morbid obesity (HCC)    s/p RY 08/2012 - start weight 290 pounds   Nephrolithiasis    Palpitations    Panic disorder    PCOS (polycystic ovarian syndrome)    PONV (postoperative nausea and vomiting)    Pregnancy induced hypertension    Shortness of breath    Vitamin D deficiency     Patient Active Problem List   Diagnosis Date Noted   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   Plantar fasciitis 10/31/2019   Bipolar 2 disorder (HCC) 09/19/2019   Fibromyalgia 09/09/2019   Ischial bursitis of left side 07/19/2019   Vitamin B12 deficiency 06/01/2019   Vitamin D deficiency 06/01/2019   Panic disorder 06/01/2019   Family history of colon cancer May 25, 2019   Chronic constipation 25-May-2019   Major depression, recurrent, chronic (HCC) 2019/05/25   Insomnia due to other mental disorder  03/13/2019   Pain in both lower extremities 11/21/2018   Nocturnal leg cramps 10/26/2018   Nephrolithiasis    Reduced libido, due to Lexapro 07/09/2017   Binge eating disorder 04/23/2017   Acquired iron deficiency anemia due to decreased absorption 02/22/2015   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    Past Surgical History:  Procedure Laterality Date   Quintella Reichert OSTEOTOMY Left 06/24/2017   Procedure: Quintella Reichert OSTEOTOMY;  Surgeon: [name], DPM;  Location: [facility];  Service: Podiatry;  Laterality: Left;   BUNIONECTOMY Left 06/24/2017   Procedure: Anthoney Harada;  Surgeon: [name], DPM;  Location: [facility];  Service: Podiatry;  Laterality: Left;   CESAREAN SECTION     x 2   CHOLECYSTECTOMY N/A 02/15/2019   Procedure: LAPAROSCOPIC CHOLECYSTECTOMY WITH INTRAOPERATIVE CHOLANGIOGRAM;  Surgeon: Andria Meuse, MD;  Location: [facility];  Service: General;  Laterality: N/A;   COLON RESECTION N/A 05/04/2020   Procedure: DIAGNOSTIC LAPAROSCOPY, RESECTION OF CANDYCANE, UPPER ENDOSCOPY;  Surgeon: Berna Bue, MD;  Location: [facility];  Service: General;  Laterality: N/A;   FOOT SURGERY     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   Procedure: LAPAROSCOPIC ROUX-EN-Y GASTRIC;  Surgeon: Atilano Ina,  MD;  Location: [facility];  Service: General;  Laterality: N/A;  laparoscopic roux-en-y gastric bypass   LITHOTRIPSY Left    TONSILLECTOMY     TUBAL LIGATION     UPPER GI ENDOSCOPY  08/24/2012   Procedure: UPPER GI ENDOSCOPY;  Surgeon: Atilano Ina, MD;  Location: [facility];  Service: General;;   WISDOM TOOTH EXTRACTION       OB History     Gravida  2   Para  2   Term  2   Preterm  0   AB  0   Living  2      SAB  0   IAB  0   Ectopic  0   Multiple  0   Live Births  2           Family History  Problem Relation Age of Onset   Hyperlipidemia Father    Hypertension Father    Kidney disease Father    Colon cancer Father  59   Cancer Father    Depression Father    Anxiety disorder Father    Bipolar disorder Father    Sleep apnea Father    Obesity Father    Hypertension Mother    Depression Mother    Anxiety disorder Mother    Obesity Mother    Hypertension Maternal Grandmother    Uterine cancer Maternal Grandmother 4   Diabetes Maternal Grandfather     Social History   Tobacco Use   Smoking status: Never   Smokeless tobacco: Never  Vaping Use   Vaping Use: Never used  Substance Use Topics   Alcohol use: Not Currently    Comment: rare   Drug use: No    Home Medications Prior to Admission medications   Medication Sig Start Date End Date Taking? Authorizing Provider  oxyCODONE-acetaminophen (PERCOCET/ROXICET) 5-325 MG tablet Take 1 tablet by mouth every 6 (six) hours as needed for up to 5 doses for severe pain. 02/05/21  Yes Cheryll Cockayne, MD  acetaminophen (TYLENOL) 500 MG tablet Take 2 tablets (1,000 mg total) by mouth every 6 (six) hours as needed for moderate pain. 02/13/19   Fayrene Helper, PA-C  albuterol (VENTOLIN HFA) 108 (90 Base) MCG/ACT inhaler Inhale 2 puffs into the lungs every 4 (four) hours as needed for wheezing or shortness of breath.  02/21/20   [provider]  baclofen (LIORESAL) 10 MG tablet Take 1 tablet (10 mg total) by mouth 3 (three) times daily as needed for muscle spasms. 06/29/20   Rodolph Bong, MD  busPIRone (BUSPAR) 15 MG tablet Take 15 mg by mouth 3 (three) times daily. 11/01/19   [provider]  CALCIUM PO Take 1 tablet by mouth daily.    [provider]  clonazePAM (KLONOPIN) 1 MG tablet Take 0.5 tablets (0.5 mg total) by mouth 2 (two) times daily as needed for anxiety. 01/09/21   Willow Ora, MD  clotrimazole (MYCELEX) 10 MG troche Take 10 mg by mouth 5 (five) times daily. 09/24/20   [provider]  cyanocobalamin (,VITAMIN B-12,) 1000 MCG/ML injection Inject 1 vial per week for 3 weeks, then 1 vial per month thereafter 06/21/20    Willow Ora, MD  cyclobenzaprine (FLEXERIL) 10 MG tablet Take 1 tablet (10 mg total) by mouth 3 (three) times daily as needed for muscle spasms. 01/31/21   Kirsteins, Victorino Sparrow, MD  diclofenac Sodium (VOLTAREN) 1 % GEL Apply 4 g topically 4 (four) times daily. Patient  taking differently: Apply 4 g topically 4 (four) times daily as needed (pain). 05/13/19   Palumbo, April, MD  docusate sodium (COLACE) 100 MG capsule Take 1 capsule (100 mg total) by mouth 2 (two) times daily. 05/06/20   Emelia LoronWakefield, Matthew, MD  lamoTRIgine (LAMICTAL) 150 MG tablet Take 300 mg by mouth daily.  04/10/20   [provider]  lithium carbonate (LITHOBID) 300 MG CR tablet Take 600 mg by mouth at bedtime.    [provider]  Magnesium 500 MG TABS Take 1 tablet by mouth daily.    [provider]  metFORMIN (GLUCOPHAGE) 500 MG tablet Take 500 mg by mouth 2 (two) times daily. 11/01/19   [provider]  ondansetron (ZOFRAN) 4 MG tablet Take 1 tablet (4 mg total) by mouth every 6 (six) hours. 09/22/20   Jacalyn LefevreHaviland, Julie, MD  pantoprazole (PROTONIX) 20 MG tablet Take 1 tablet (20 mg total) by mouth daily. 12/14/20   Willow OraAndy, Camille L, MD  pregabalin (LYRICA) 200 MG capsule Take 1 capsule (200 mg total) by mouth 2 (two) times daily. 11/27/20   Kirsteins, Victorino SparrowAndrew E, MD  QUEtiapine (SEROQUEL) 400 MG tablet Take 400 mg by mouth at bedtime.  08/24/19   [provider]  QUEtiapine (SEROQUEL) 50 MG tablet Take by mouth. 06/27/20   [provider]  SYRINGE-NEEDLE, DISP, 3 ML 25G X 1" 3 ML MISC Use 1 needle per application once weekly 06/21/20   Willow OraAndy, Camille L, MD  tizanidine (ZANAFLEX) 2 MG capsule Take 2 capsules (4 mg total) by mouth 3 (three) times daily as needed for muscle spasms. 11/15/20   Rodolph Bongorey, Evan S, MD  valACYclovir (VALTREX) 1000 MG tablet Take 1 tablet (1,000 mg total) by mouth 3 (three) times daily. Patient taking differently: Take 1,000 mg by mouth 3 (three) times daily as needed  (cold sores). 05/25/19   Daphine DeutscherMartin, Mary-Margaret, FNP  Vitamin D, Ergocalciferol, (DRISDOL) 1.25 MG (50000 UT) CAPS capsule Take 50,000 Units by mouth once a week. Fridays    [provider]    Allergies    Nsaids, Other, and Zolpidem  Review of Systems   Review of Systems  Constitutional:  Negative for fever.  HENT:  Negative for ear pain.   Eyes:  Negative for pain.  Respiratory:  Negative for cough.   Cardiovascular:  Negative for chest pain.  Gastrointestinal:  Negative for abdominal pain.  Genitourinary:  Positive for flank pain.  Musculoskeletal:  Negative for back pain.  Skin:  Negative for rash.  Neurological:  Negative for headaches.   Physical Exam Updated Vital Signs BP 120/74   Pulse 84   Temp 98.3 F (36.8 C) (Oral)   Resp 18   Ht 5\' 4"  (1.626 m)   Wt 125.6 kg   SpO2 100%   BMI 47.55 kg/m   Physical Exam Constitutional:      General: She is not in acute distress.    Appearance: Normal appearance.  HENT:     Head: Normocephalic.     Nose: Nose normal.  Eyes:     Extraocular Movements: Extraocular movements intact.  Cardiovascular:     Rate and Rhythm: Normal rate.  Pulmonary:     Effort: Pulmonary effort is normal.  Abdominal:     General: There is no distension.     Palpations: Abdomen is soft.     Tenderness: There is no abdominal tenderness. There is left CVA tenderness. There is no right CVA tenderness, guarding or rebound.  Musculoskeletal:  General: Normal range of motion.     Cervical back: Normal range of motion.  Neurological:     General: No focal deficit present.     Mental Status: She is alert. Mental status is at baseline.    ED Results / Procedures / Treatments   Labs (all labs ordered are listed, but only abnormal results are displayed) Labs Reviewed  URINALYSIS, ROUTINE W REFLEX MICROSCOPIC - Abnormal; Notable for the following components:      Result Value   Hgb urine dipstick TRACE (*)    Protein, ur TRACE (*)     Leukocytes,Ua SMALL (*)    Bacteria, UA RARE (*)    All other components within normal limits  CBC - Abnormal; Notable for the following components:   RDW 15.6 (*)    All other components within normal limits  URINE CULTURE  PREGNANCY, URINE  BASIC METABOLIC PANEL    EKG None  Radiology CT Renal Stone Study  Result Date: 02/05/2021 CLINICAL DATA:  Left flank pain, nausea, abdominal pain EXAM: CT ABDOMEN AND PELVIS WITHOUT CONTRAST TECHNIQUE: Multidetector CT imaging of the abdomen and pelvis was performed following the standard protocol without IV contrast. COMPARISON:  09/22/2020, 01/05/2019 FINDINGS: Lower chest: There is patchy ground-glass pulmonary infiltrate within the visualized left lung base, possibly infectious in the acute setting. No pleural effusion. The visualized heart and pericardium are unremarkable. Small hiatal hernia noted. Hepatobiliary: No focal liver abnormality is seen. Status post cholecystectomy. No biliary dilatation. Pancreas: There is heterogeneous fatty infiltration within the pancreatic head which appears stable since immediate prior examination, but is progressive since prior examinations of 01/05/2019 and 04/29/2018. There is, additionally, mild enlargement of the pancreatic head, of unclear significance. The pancreas is otherwise unremarkable. Spleen: Unremarkable Adrenals/Urinary Tract: Adrenal glands are unremarkable. Kidneys are normal, without renal calculi, focal lesion, or hydronephrosis. Bladder is unremarkable. Stomach/Bowel: Surgical changes of Roux-en-Y gastric bypass are identified. No evidence of obstruction or focal inflammation. Moderate stool seen throughout the colon. The stomach, small bowel, and large bowel are otherwise unremarkable. No free intraperitoneal gas or fluid. Vascular/Lymphatic: No significant vascular findings are present. No enlarged abdominal or pelvic lymph nodes. Reproductive: Uterus and bilateral adnexa are unremarkable.  Other: No abdominal wall hernia.  Rectum unremarkable. Musculoskeletal: No lytic or blastic bone lesion. IMPRESSION: Patchy pulmonary infiltrate within the visualized left lung base, possibly infectious in the acute setting. No acute intra-abdominal pathology. Progressive enlargement of the pancreatic head with macroscopic fatty infiltration. While this may simply represent asymmetric fatty infiltration of the pancreatic head, the enlargement is unusual and contrast enhanced MRI examination or EUS examination may be helpful to exclude an underlying mass lesion. Moderate stool within the colon without evidence of obstruction. Electronically Signed   By: Helyn Numbers M.D.   On: 02/05/2021 22:03    Procedures Procedures   Medications Ordered in ED Medications  acetaminophen (TYLENOL) tablet 650 mg (650 mg Oral Given 02/05/21 2115)  ondansetron [facility]) injection 4 mg (4 mg Intravenous Given 02/05/21 2113)  morphine 4 MG/ML injection 4 mg (4 mg Intravenous Given 02/05/21 2118)    ED Course  I have reviewed the triage vital signs and the nursing notes.  Pertinent labs & imaging results that were available during my care of the patient were reviewed by me and considered in my medical decision making (see chart for details).    MDM Rules/Calculators/A&P  Labs show normal white count normal hemoglobin.  Chemistry normal as well.  Urinalysis shows rare bacteria small leukocytes.  CT abdomen pelvis pursued with no evidence of kidney stone.  No clear etiology for the patient's left flank pain.  Incidental finding of enlargement of the pancreatic head noted.  Patient advised follow-up with GI or primary care doctor to pursue additional imaging of her pancreas.  Recommending immediate return for fevers worsening pain or any additional concerns.  Otherwise advising her to follow-up with primary care doctor within the week.   Final Clinical Impression(s) / ED  Diagnoses Final diagnoses:  Flank pain    Rx / DC Orders ED Discharge Orders          Ordered    oxyCODONE-acetaminophen (PERCOCET/ROXICET) 5-325 MG tablet  Every 6 hours PRN        02/05/21 2211             Cheryll Cockayne, MD 02/05/21 2211

## 2021-02-07 LAB — URINE CULTURE

## 2021-02-26 DIAGNOSIS — Z79899 Other long term (current) drug therapy: Secondary | ICD-10-CM | POA: Diagnosis not present

## 2021-02-26 DIAGNOSIS — F3181 Bipolar II disorder: Secondary | ICD-10-CM | POA: Diagnosis not present

## 2021-02-26 LAB — CBC AND DIFFERENTIAL
HCT: 37 (ref 36–46)
Hemoglobin: 12.5 (ref 12.0–16.0)
Platelets: 325 (ref 150–399)
WBC: 9.3

## 2021-02-26 LAB — BASIC METABOLIC PANEL
BUN: 14 (ref 4–21)
CO2: 22 (ref 13–22)
Chloride: 104 (ref 99–108)
Creatinine: 0.7 (ref 0.5–1.1)
Glucose: 86
Potassium: 4.3 (ref 3.4–5.3)
Sodium: 141 (ref 137–147)

## 2021-02-26 LAB — LIPID PANEL
Cholesterol: 233 — AB (ref 0–200)
HDL: 70 (ref 35–70)
LDL Cholesterol: 134
Triglycerides: 167 — AB (ref 40–160)

## 2021-02-26 LAB — HEPATIC FUNCTION PANEL
Alkaline Phosphatase: 181 — AB (ref 25–125)
Bilirubin, Total: 0.2

## 2021-02-26 LAB — CBC: RBC: 4.62 (ref 3.87–5.11)

## 2021-02-26 LAB — VITAMIN D 25 HYDROXY (VIT D DEFICIENCY, FRACTURES): Vit D, 25-Hydroxy: 25.6

## 2021-02-26 LAB — COMPREHENSIVE METABOLIC PANEL
Albumin: 4.3 (ref 3.5–5.0)
Calcium: 9.1 (ref 8.7–10.7)
Globulin: 2.2

## 2021-02-26 LAB — HEMOGLOBIN A1C: Hemoglobin A1C: 4.9

## 2021-02-26 LAB — TSH: TSH: 4.58 (ref 0.41–5.90)

## 2021-02-28 ENCOUNTER — Emergency Department (HOSPITAL_BASED_OUTPATIENT_CLINIC_OR_DEPARTMENT_OTHER)
Admission: EM | Admit: 2021-02-28 | Discharge: 2021-02-28 | Disposition: A | Discharge status: Home / Self Care | Payer: BC Managed Care – PPO | Attending: Emergency Medicine | Admitting: Emergency Medicine

## 2021-02-28 ENCOUNTER — Ambulatory Visit: Payer: BC Managed Care – PPO | Admitting: Physical Medicine & Rehabilitation

## 2021-02-28 ENCOUNTER — Other Ambulatory Visit: Payer: Self-pay

## 2021-02-28 ENCOUNTER — Encounter (HOSPITAL_BASED_OUTPATIENT_CLINIC_OR_DEPARTMENT_OTHER): Payer: Self-pay | Admitting: Emergency Medicine

## 2021-02-28 DIAGNOSIS — N12 Tubulo-interstitial nephritis, not specified as acute or chronic: Secondary | ICD-10-CM | POA: Diagnosis not present

## 2021-02-28 DIAGNOSIS — Z79899 Other long term (current) drug therapy: Secondary | ICD-10-CM | POA: Diagnosis not present

## 2021-02-28 DIAGNOSIS — E039 Hypothyroidism, unspecified: Secondary | ICD-10-CM | POA: Diagnosis not present

## 2021-02-28 DIAGNOSIS — R300 Dysuria: Secondary | ICD-10-CM | POA: Diagnosis not present

## 2021-02-28 DIAGNOSIS — I10 Essential (primary) hypertension: Secondary | ICD-10-CM | POA: Insufficient documentation

## 2021-02-28 DIAGNOSIS — R109 Unspecified abdominal pain: Secondary | ICD-10-CM

## 2021-02-28 LAB — URINALYSIS, ROUTINE W REFLEX MICROSCOPIC
Bilirubin Urine: NEGATIVE
Glucose, UA: NEGATIVE mg/dL
Hgb urine dipstick: NEGATIVE
Ketones, ur: NEGATIVE mg/dL
Leukocytes,Ua: NEGATIVE
Nitrite: POSITIVE — AB
Protein, ur: NEGATIVE mg/dL
Specific Gravity, Urine: 1.017 (ref 1.005–1.030)
pH: 5.5 (ref 5.0–8.0)

## 2021-02-28 LAB — PREGNANCY, URINE: Preg Test, Ur: NEGATIVE

## 2021-02-28 MED ORDER — CEPHALEXIN 500 MG PO CAPS: 500.0000 mg | ORAL_CAPSULE | Freq: Three times a day (TID) | ORAL | 0 refills | Status: AC

## 2021-02-28 MED ORDER — HYDROCODONE-ACETAMINOPHEN 5-325 MG PO TABS
1.0000 | ORAL_TABLET | Freq: Once | ORAL | Status: AC
  Administered 2021-02-28: 1 via ORAL
  Filled 2021-02-28: qty 1

## 2021-02-28 MED ORDER — OXYCODONE-ACETAMINOPHEN 5-325 MG PO TABS: 1.0000 | ORAL_TABLET | Freq: Four times a day (QID) | ORAL | 0 refills | Status: DC | PRN

## 2021-02-28 NOTE — ED Provider Notes (Signed)
[facility] EMERGENCY DEPT Provider Note   CSN: [id number] Arrival date & time: 02/28/21  0756     History Chief Complaint  Patient presents with  . Dysuria    [name] [name] is a 35 y.o. female.  HPI     35 year old female with a history of hypertension, Roux-en-Y bypass, candycane syndrome with subsequent resection, cholecystectomy, bipolar disorder type 2, depression, elevated cholesterol, who presents with concern for flank pain and dysuria.  Reports that she began having urinary symptoms beginning Sunday with increase of symptoms yesterday and today.  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  Urine appears cloudy.  Has been trying pyridium and tylenol, heat packs without relief.  Reports lower abdominal pain with pain in bilateral back, left flank 8/10, right flank 5/10. Has nephrolithiasis history but the symptoms today do not feel like that. Achy pain, not as sharp as priori kidney stones.  Has nausea and one episode of emesis.  No diarrhea or constipation, passing flatus. No fevers. No vaginal discharge. Denies STI concerns.   Chart refiew shows prior similar presentation with left flank pain 8/23 and 4/09 of this year without clear etiology, was recommended outpatient evaluation of pancreatic head enlargement.   Past Medical History:  Diagnosis Date  . ADD (attention deficit disorder)   . Anemia   . Anxiety   . B12 deficiency   . Back pain   . Bilateral swelling of feet   . Bipolar 2 disorder (HCC) 09/19/2019  . Constipation   . DEPRESSION   . Elevated cholesterol   . Family history of colon cancer 2019/05/19   Father, 48s; deceased.   . Fatty liver   . Fibromyalgia   . Gallbladder problem   . GERD (gastroesophageal reflux disease)   . Heartburn   . History of stomach ulcers   . Hypertension   . Hypothyroidism    Hx of, normalized TSH during pregnancy 2011  . Infertility, female   . Lactose intolerance   .  Migraines   . Morbid obesity (HCC)    s/p RY 08/2012 - start weight 290 pounds  . Nephrolithiasis   . Palpitations   . Panic disorder   . PCOS (polycystic ovarian syndrome)   . PONV (postoperative nausea and vomiting)   . Pregnancy induced hypertension   . Shortness of breath   . Vitamin D deficiency     Patient Active Problem List   Diagnosis Date Noted  . 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  . Plantar fasciitis 10/31/2019  . Bipolar 2 disorder (HCC) 09/19/2019  . Fibromyalgia 09/09/2019  . Ischial bursitis of left side 07/19/2019  . Vitamin B12 deficiency 06/01/2019  . Vitamin D deficiency 06/01/2019  . Panic disorder 06/01/2019  . Family history of colon cancer 05/19/19  . Chronic constipation 19-May-2019  . Major depression, recurrent, chronic (HCC) 19-May-2019  . Insomnia due to other mental disorder 03/13/2019  . Pain in both lower extremities 11/21/2018  . Nocturnal leg cramps 10/26/2018  . Nephrolithiasis   . Reduced libido, due to Lexapro 07/09/2017  . Binge eating disorder 04/23/2017  . Acquired iron deficiency anemia due to decreased absorption 02/22/2015  . 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 Quintella Reichert OSTEOTOMY Left 06/24/2017   Procedure: Ralene Bathe;  Surgeon: [name], DPM;  Location: [facility];  Service: Podiatry;  Laterality: Left;  . BUNIONECTOMY Left 06/24/2017   Procedure:  Anthoney Harada;  Surgeon: [name], DPM;  Location: [facility];  Service: Podiatry;  Laterality: Left;  . CESAREAN SECTION     x 2  . CHOLECYSTECTOMY N/A 02/15/2019   Procedure: LAPAROSCOPIC CHOLECYSTECTOMY WITH INTRAOPERATIVE CHOLANGIOGRAM;  Surgeon: Andria Meuse, MD;  Location: [facility];  Service: General;  Laterality: N/A;  . COLON RESECTION N/A 05/04/2020   Procedure: DIAGNOSTIC LAPAROSCOPY, RESECTION OF CANDYCANE, UPPER ENDOSCOPY;   Surgeon: Berna Bue, MD;  Location: [facility];  Service: General;  Laterality: N/A;  . FOOT SURGERY    . 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   Procedure: LAPAROSCOPIC ROUX-EN-Y GASTRIC;  Surgeon: Atilano Ina, MD;  Location: [facility];  Service: General;  Laterality: N/A;  laparoscopic roux-en-y gastric bypass  . LITHOTRIPSY Left   . TONSILLECTOMY    . TUBAL LIGATION    . UPPER GI ENDOSCOPY  08/24/2012   Procedure: UPPER GI ENDOSCOPY;  Surgeon: Atilano Ina, MD;  Location: [facility];  Service: General;;  . WISDOM TOOTH EXTRACTION       OB History     Gravida  2   Para  2   Term  2   Preterm  0   AB  0   Living  2      SAB  0   IAB  0   Ectopic  0   Multiple  0   Live Births  2           Family History  Problem Relation Age of Onset  . Hyperlipidemia Father   . Hypertension Father   . Kidney disease Father   . Colon cancer Father 72  . Cancer Father   . Depression Father   . Anxiety disorder Father   . Bipolar disorder Father   . Sleep apnea Father   . Obesity Father   . Hypertension Mother   . Depression Mother   . Anxiety disorder Mother   . Obesity Mother   . Hypertension Maternal Grandmother   . Uterine cancer Maternal Grandmother 53  . Diabetes Maternal Grandfather     Social History   Tobacco Use  . Smoking status: Never  . Smokeless tobacco: Never  Vaping Use  . Vaping Use: Never used  Substance Use Topics  . Alcohol use: Not Currently    Comment: rare  . Drug use: No    Home Medications Prior to Admission medications   Medication Sig Start Date End Date Taking? Authorizing Provider  cephALEXin (KEFLEX) 500 MG capsule Take 1 capsule (500 mg total) by mouth 3 (three) times daily for 10 days. 02/28/21 03/10/21 Yes Alvira Monday, MD  oxyCODONE-acetaminophen (PERCOCET/ROXICET) 5-325 MG tablet Take 1 tablet by mouth every 6 (six) hours as needed for severe pain. 02/28/21  Yes Alvira Monday, MD  acetaminophen (TYLENOL) 500 MG tablet Take  2 tablets (1,000 mg total) by mouth every 6 (six) hours as needed for moderate pain. 02/13/19   Fayrene Helper, PA-C  albuterol (VENTOLIN HFA) 108 (90 Base) MCG/ACT inhaler Inhale 2 puffs into the lungs every 4 (four) hours as needed for wheezing or shortness of breath.  02/21/20   [provider]  baclofen (LIORESAL) 10 MG tablet Take 1 tablet (10 mg total) by mouth 3 (three) times daily as needed for muscle spasms. 06/29/20   Rodolph Bong, MD  busPIRone (BUSPAR) 15 MG tablet Take 15 mg by mouth 3 (three) times daily. 11/01/19   [provider]  CALCIUM PO Take  1 tablet by mouth daily.    [provider]  clonazePAM (KLONOPIN) 1 MG tablet Take 0.5 tablets (0.5 mg total) by mouth 2 (two) times daily as needed for anxiety. 01/09/21   Willow Ora, MD  clotrimazole (MYCELEX) 10 MG troche Take 10 mg by mouth 5 (five) times daily. 09/24/20   [provider]  cyanocobalamin (,VITAMIN B-12,) 1000 MCG/ML injection Inject 1 vial per week for 3 weeks, then 1 vial per month thereafter 06/21/20   Willow Ora, MD  cyclobenzaprine (FLEXERIL) 10 MG tablet Take 1 tablet (10 mg total) by mouth 3 (three) times daily as needed for muscle spasms. 01/31/21   Kirsteins, Victorino Sparrow, MD  diclofenac Sodium (VOLTAREN) 1 % GEL Apply 4 g topically 4 (four) times daily. Patient taking differently: Apply 4 g topically 4 (four) times daily as needed (pain). 05/13/19   Palumbo, April, MD  docusate sodium (COLACE) 100 MG capsule Take 1 capsule (100 mg total) by mouth 2 (two) times daily. 05/06/20   Emelia Loron, MD  lamoTRIgine (LAMICTAL) 150 MG tablet Take 300 mg by mouth daily.  04/10/20   [provider]  lithium carbonate (LITHOBID) 300 MG CR tablet Take 600 mg by mouth at bedtime.    [provider]  Magnesium 500 MG TABS Take 1 tablet by mouth daily.    [provider]  metFORMIN (GLUCOPHAGE) 500 MG tablet Take 500 mg by mouth 2 (two) times daily. 11/01/19    [provider]  ondansetron (ZOFRAN) 4 MG tablet Take 1 tablet (4 mg total) by mouth every 6 (six) hours. 09/22/20   Jacalyn Lefevre, MD  pantoprazole (PROTONIX) 20 MG tablet Take 1 tablet (20 mg total) by mouth daily. 12/14/20   Willow Ora, MD  pregabalin (LYRICA) 200 MG capsule Take 1 capsule (200 mg total) by mouth 2 (two) times daily. 11/27/20   Kirsteins, Victorino Sparrow, MD  QUEtiapine (SEROQUEL) 400 MG tablet Take 400 mg by mouth at bedtime.  08/24/19   [provider]  QUEtiapine (SEROQUEL) 50 MG tablet Take by mouth. 06/27/20   [provider]  SYRINGE-NEEDLE, DISP, 3 ML 25G X 1" 3 ML MISC Use 1 needle per application once weekly 06/21/20   Willow Ora, MD  tizanidine (ZANAFLEX) 2 MG capsule Take 2 capsules (4 mg total) by mouth 3 (three) times daily as needed for muscle spasms. 11/15/20   Rodolph Bong, MD  valACYclovir (VALTREX) 1000 MG tablet Take 1 tablet (1,000 mg total) by mouth 3 (three) times daily. Patient taking differently: Take 1,000 mg by mouth 3 (three) times daily as needed (cold sores). 05/25/19   Daphine Deutscher Mary-Margaret, FNP  Vitamin D, Ergocalciferol, (DRISDOL) 1.25 MG (50000 UT) CAPS capsule Take 50,000 Units by mouth once a week. Fridays    [provider]    Allergies    Nsaids, Other, and Zolpidem  Review of Systems   Review of Systems  Constitutional:  Negative for fever.  Eyes:  Negative for visual disturbance.  Respiratory:  Negative for cough and shortness of breath.   Cardiovascular:  Negative for chest pain.  Gastrointestinal:  Positive for abdominal pain, nausea and vomiting. Negative for constipation and diarrhea.  Genitourinary:  Positive for dysuria, flank pain, frequency and urgency.  Musculoskeletal:  Positive for back pain.  Skin:  Negative for rash.  Neurological:  Negative for syncope.   Physical Exam Updated Vital Signs BP (!) 130/93 (BP Location: Right Arm)   Pulse 94  Temp 98.2 F (36.8 C) (Oral)   Resp 18    Ht 5\' 4"  (1.626 m)   Wt 125.6 kg   SpO2 99%   BMI 47.55 kg/m   Physical Exam Vitals and nursing note reviewed.  Constitutional:      General: She is not in acute distress.    Appearance: Normal appearance. She is not ill-appearing, toxic-appearing or diaphoretic.  HENT:     Head: Normocephalic.  Eyes:     Conjunctiva/sclera: Conjunctivae normal.  Cardiovascular:     Rate and Rhythm: Normal rate and regular rhythm.     Pulses: Normal pulses.  Pulmonary:     Effort: Pulmonary effort is normal. No respiratory distress.  Abdominal:     General: Abdomen is flat.     Palpations: Abdomen is soft.     Tenderness: There is abdominal tenderness (mild suprapubi). There is right CVA tenderness and left CVA tenderness. There is no guarding.  Musculoskeletal:        General: No deformity or signs of injury.     Cervical back: No rigidity.  Skin:    General: Skin is warm and dry.     Coloration: Skin is not jaundiced or pale.  Neurological:     General: No focal deficit present.     Mental Status: She is alert and oriented to person, place, and time.    ED Results / Procedures / Treatments   Labs (all labs ordered are listed, but only abnormal results are displayed) Labs Reviewed  URINALYSIS, ROUTINE W REFLEX MICROSCOPIC - Abnormal; Notable for the following components:      Result Value   Color, Urine ORANGE (*)    Nitrite POSITIVE (*)    Bacteria, UA FEW (*)    All other components within normal limits  URINE CULTURE  PREGNANCY, URINE    EKG None  Radiology No results found.  Procedures Procedures   Medications Ordered in ED Medications  HYDROcodone-acetaminophen (NORCO/VICODIN) 5-325 MG per tablet 1 tablet (1 tablet Oral Given 02/28/21 4709)    ED Course  I have reviewed the triage vital signs and the nursing notes.  Pertinent labs & imaging results that were available during my care of the patient were reviewed by me and considered in my medical decision making  (see chart for details).    MDM Rules/Calculators/A&P                            35 year old female with a history of hypertension, Roux-en-Y bypass, candycane syndrome with subsequent resection, cholecystectomy, bipolar disorder type 2, depression, elevated cholesterol, who presents with concern for flank pain and dysuria.  Noted to have 2 prior visits for left flank pain and urinary symptoms this year without clear etiology.  Pregnancy test negative. UA shows 6-10WBC, nitrite positive.  History and exam do not appear consistent with nephrolithiasis, diverticulitis, appendicitis, small bowel obstruction, other Roux-en-Y complication.  History is most consistent with pyelonephritis.  Discussed that urinalysis is difficult to interpret in setting of Pyridium affecting nitrite, however given symptoms most consistent with pyelonephritis and these findings, will cover with antibiotics for 10 days.  Discussed given her prior presentations for urinary symptoms without clear sign of urinary tract infection would consider other diagnoses such as interstitial cystitis, although it is possible other presentations were due to recently passed kidney stone.  Recommend follow-up with primary care physician.  Given 5 pills of narcotic medication given description of pain, attempting  to use all over-the-counter options and inability to take NSAIDs.  Reviewed in [place] drug database discussed the risks. Patient discharged in stable condition with understanding of reasons to return and will follow up with PCP (also regarding recent CT findings of pancreatic head enlargement.)   Final Clinical Impression(s) / ED Diagnoses Final diagnoses:  Pyelonephritis  Flank pain    Rx / DC Orders ED Discharge Orders          Ordered    oxyCODONE-acetaminophen (PERCOCET/ROXICET) 5-325 MG tablet  Every 6 hours PRN        02/28/21 0904    cephALEXin (KEFLEX) 500 MG capsule  3 times daily        02/28/21 0905              Alvira Monday, MD 02/28/21 [phone]

## 2021-02-28 NOTE — ED Triage Notes (Signed)
Pt from home c/o painful urination that started Monday evening. Pt reports cloudy urine and foul odor.

## 2021-03-01 LAB — URINE CULTURE: Culture: NO GROWTH

## 2021-03-06 ENCOUNTER — Encounter: Payer: Self-pay | Admitting: Family Medicine

## 2021-03-06 ENCOUNTER — Ambulatory Visit (INDEPENDENT_AMBULATORY_CARE_PROVIDER_SITE_OTHER): Payer: BC Managed Care – PPO | Admitting: Family Medicine

## 2021-03-06 ENCOUNTER — Other Ambulatory Visit (HOSPITAL_COMMUNITY)
Admission: RE | Admit: 2021-03-06 | Discharge: 2021-03-06 | Disposition: A | Discharge status: Home / Self Care | Payer: BC Managed Care – PPO | Source: Ambulatory Visit | Attending: Family Medicine | Admitting: Family Medicine

## 2021-03-06 ENCOUNTER — Other Ambulatory Visit: Payer: Self-pay

## 2021-03-06 VITALS — BP 128/80 | HR 85 | Temp 98.3°F | Ht 64.0 in | Wt 273.2 lb

## 2021-03-06 DIAGNOSIS — Z0000 Encounter for general adult medical examination without abnormal findings: Secondary | ICD-10-CM

## 2021-03-06 DIAGNOSIS — F3181 Bipolar II disorder: Secondary | ICD-10-CM | POA: Diagnosis not present

## 2021-03-06 DIAGNOSIS — Z9884 Bariatric surgery status: Secondary | ICD-10-CM

## 2021-03-06 DIAGNOSIS — Z124 Encounter for screening for malignant neoplasm of cervix: Secondary | ICD-10-CM | POA: Insufficient documentation

## 2021-03-06 NOTE — Progress Notes (Signed)
Subjective  Chief Complaint  Patient presents with   Annual Exam    Fasting    HPI: [name] [name] is a 35 y.o. female who presents to [facility] [place] today for a Female Wellness Visit. She also has the concerns and/or needs as listed above in the chief complaint. These will be addressed in addition to the Health Maintenance Visit.   Wellness Visit: annual visit with health maintenance review and exam with Pap  HM: due pap. Overall, reports doing well. On abx for UTI> reviewed recent ER eval and imaging reports. 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  Chronic disease f/u and/or acute problem visit: (deemed necessary to be done in addition to the wellness visit): Bipolar managed by psych; reveiwed labs from 9/11. TSH 4.5; vit D 25. Fasting trigs 167; LDL 135. Alk phsop mildly elevated. Other labs unremarkable.   Assessment  1. Annual physical exam   2. Cervical cancer screening   3. History of Roux-en-Y gastric bypass 2014   4. Bipolar 2 disorder [facility])      Plan  Female Wellness Visit: Age appropriate Health Maintenance and Prevention measures were discussed with patient. Included topics are cancer screening recommendations, ways to keep healthy (see AVS) including dietary and exercise recommendations, regular eye and dental care, use of seat belts, and avoidance of moderate alcohol use and tobacco use. Pap with HPV screen today. BMI: discussed patient's BMI and encouraged positive lifestyle modifications to help get to or maintain a target BMI. HM needs and immunizations were addressed and ordered. See below for orders. See HM and immunization section for updates. Utd . Eligible for 4th covid booster Routine labs and screening tests ordered including cmp, cbc and lipids where appropriate. Discussed recommendations regarding Vit D and calcium supplementation rec increasing supp ot vit d 2000 IU daily.  Chronic disease  management visit and/or acute problem visit: Chronic medical problems are stable. Labs reviewed. No med changes. Fatty infilt of pancreas: on multiple imaging studies; no further imaging recommended at this time.   Follow up: 6 mo for recheck  No orders of the defined types were placed in this encounter.  No orders of the defined types were placed in this encounter.     Body mass index is 46.89 kg/m. Wt Readings from Last 3 Encounters:  03/06/21 273 lb 3.2 oz (123.9 kg)  02/28/21 277 lb (125.6 kg)  02/05/21 277 lb (125.6 kg)     Patient Active Problem List   Diagnosis Date Noted   Bipolar 2 disorder ([place]) 09/19/2019    Priority: High   Fibromyalgia 09/09/2019    Priority: High   Panic disorder 06/01/2019    Priority: High   Family history of colon cancer 05/16/2019    Priority: High    Father, 73s; deceased.     Major depression, recurrent, chronic ([place]) 05/16/2019    Priority: High   Insomnia due to other mental disorder 03/13/2019    Priority: High    Failed ambien - sleep walking, trazadone didn't work. Uses xanax at night for sleep.    Acquired iron deficiency anemia due to decreased absorption 02/22/2015    Priority: High   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    Priority: High    Yearly labs, s/p bariatric surgery: CBC, CMP, iron, ferritin, B12, PTH, B1, folate, zinc, copper.     Vitamin B12 deficiency 06/01/2019    Priority: Medium   Chronic  constipation 05/16/2019    Priority: Medium   Pain in both lower extremities 11/21/2018    Priority: Medium    May be due to vitamin deficiences: restarted supplements 12/20    Nocturnal leg cramps 10/26/2018    Priority: Medium   Nephrolithiasis     Priority: Medium   Binge eating disorder 04/23/2017    Priority: Medium    Meets criteria. Trial of Vyvanse. Recheck in one month for BP, weight check.    Vitamin D deficiency 06/01/2019    Priority: Low   Reduced libido, due to Lexapro 07/09/2017     Priority: Low   Intussusception of small intestine ([place]) 05/03/2020   Plantar fasciitis 10/31/2019   Ischial bursitis of left side 07/19/2019   Health Maintenance  Topic Date Due   Hepatitis C Screening  Never done   PAP SMEAR-Modifier  04/14/2020   COLONOSCOPY (Pts 45-40yr Insurance coverage will need to be confirmed)  07/26/2025   TETANUS/TDAP  07/16/2026   INFLUENZA VACCINE  Completed   COVID-19 Vaccine  Completed   HIV Screening  Completed   HPV VACCINES  Aged Out   Immunization History  Administered Date(s) Administered   Hepb-cpg 11/09/2020, 12/14/2020   Influenza Split 03/17/2011   Influenza,inj,Quad PF,6+ Mos 03/16/2013, 04/04/2016, 03/11/2019   Influenza-Unspecified 03/16/2014, 02/22/2017, 03/31/2018, 03/01/2020, 03/01/2021   PFIZER(Purple Top)SARS-COV-2 Vaccination 06/10/2019, 06/26/2019, 12/24/2019   PPD Test 08/24/2020   Td 06/16/2006   Tdap 07/16/2016   We updated and reviewed the patient's past history in detail and it is documented below. Allergies: Patient is allergic to nsaids, other, and zolpidem. Past Medical History Patient  has a past medical history of ADD (attention deficit disorder), Anemia, Anxiety, B12 deficiency, Back pain, Bilateral swelling of feet, Bipolar 2 disorder ([place] (09/19/2019), Constipation, DEPRESSION, Elevated cholesterol, Family history of colon cancer (05/16/2019), Fatty liver, Fibromyalgia, Gallbladder problem, GERD (gastroesophageal reflux disease), Heartburn, History of stomach ulcers, Hypertension, Hypothyroidism, Infertility, female, Lactose intolerance, Migraines, Morbid obesity ([place], Nephrolithiasis, Palpitations, Panic disorder, PCOS (polycystic ovarian syndrome), PONV (postoperative nausea and vomiting), Pregnancy induced hypertension, Shortness of breath, and Vitamin D deficiency. Past Surgical History Patient  has a past surgical history that includes Cesarean section; Tonsillectomy; Wisdom tooth extraction; Tubal ligation;  Lithotripsy (Left); Gastric Roux-En-Y (N/A, 08/24/2012); Upper gi endoscopy (08/24/2012); Foot surgery; Bunionectomy (Left, 06/24/2017); [place] osteotomy (Left, 06/24/2017); Cholecystectomy (N/A, 02/15/2019); and Colon resection (N/A, 05/04/2020). Family History: Patient family history includes Anxiety disorder in her father and mother; Bipolar disorder in her father; Cancer in her father; Colon cancer (age of onset: 539 in her father; Depression in her father and mother; Diabetes in her maternal grandfather; Hyperlipidemia in her father; Hypertension in her father, maternal grandmother, and mother; Kidney disease in her father; Obesity in her father and mother; Sleep apnea in her father; Uterine cancer (age of onset: 741 in her maternal grandmother. Social History:  Patient  reports that she has never smoked. She has never used smokeless tobacco. She reports that she does not currently use alcohol. She reports that she does not use drugs.  Review of Systems: Constitutional: negative for fever or malaise Ophthalmic: negative for photophobia, double vision or loss of vision Cardiovascular: negative for chest pain, dyspnea on exertion, or new LE swelling Respiratory: negative for SOB or persistent cough Gastrointestinal: negative for new abdominal pain, change in bowel habits or melena Genitourinary: negative for dysuria or gross hematuria, no abnormal uterine bleeding or disharge Musculoskeletal: negative for new gait disturbance or muscular weakness Integumentary: negative for new  or persistent rashes, no breast lumps Neurological: negative for TIA or stroke symptoms Psychiatric: negative for SI or delusions Allergic/Immunologic: negative for hives  Patient Care Team    Relationship Specialty Notifications Start End  Leamon Arnt, MD PCP - General Family Medicine  05/13/19   Crawford Givens, MD Referring Physician Obstetrics and Gynecology  09/13/10   Himmelrich, Bryson Ha, RD (Inactive) Dietitian  Bariatrics  06/29/12   Hilts, Legrand Como, MD Consulting Physician Sports Medicine  05/16/19   Milus Banister, MD Attending Physician Gastroenterology  05/16/19   Greer Pickerel, MD Consulting Physician General Surgery  05/16/19     Objective  Vitals: BP 128/80   Pulse 85   Temp 98.3 F (36.8 C) (Temporal)   Ht _0  (1.626 m)   Wt 273 lb 3.2 oz (123.9 kg)   SpO2 96%   BMI 46.89 kg/m  General:  Well developed, well nourished, no acute distress  Psych:  Alert and orientedx3,normal mood and affect HEENT:  Normocephalic, atraumatic, non-icteric sclera,  supple neck without adenopathy, mass or thyromegaly Cardiovascular:  Normal S1, S2, RRR without gallop, rub or murmur Respiratory:  Good breath sounds bilaterally, CTAB with normal respiratory effort Gastrointestinal: normal bowel sounds, soft, non-tender, no noted masses. No HSM MSK: no deformities, contusions. Joints are without erythema or swelling.  Skin:  Warm, no rashes or suspicious lesions noted Neurologic:    Mental status is normal. CN 2-11 are normal. Gross motor and sensory exams are normal. Normal gait. No tremor Breast Exam: No mass, skin retraction or nipple discharge is appreciated in either breast. No axillary adenopathy. Fibrocystic changes are not noted Pelvic Exam: Normal external genitalia, no vulvar or vaginal lesions present. Clear cervix w/o CMT. Bimanual exam reveals a nontender fundus w/o masses, nl size. No adnexal masses present. No inguinal adenopathy. A PAP smear was performed.   Commons side effects, risks, benefits, and alternatives for medications and treatment plan prescribed today were discussed, and the patient expressed understanding of the given instructions. Patient is instructed to call or message via MyChart if he/she has any questions or concerns regarding our treatment plan. No barriers to understanding were identified. We discussed Red Flag symptoms and signs in detail. Patient expressed understanding  regarding what to do in case of urgent or emergency type symptoms.  Medication list was reconciled, printed and provided to the patient in AVS. Patient instructions and summary information was reviewed with the patient as documented in the AVS. This note was prepared with assistance of Dragon voice recognition software. Occasional wrong-word or sound-a-like substitutions may have occurred due to the inherent limitations of voice recognition software  This visit occurred during the SARS-CoV-2 public health emergency.  Safety protocols were in place, including screening questions prior to the visit, additional usage of staff PPE, and extensive cleaning of exam room while observing appropriate contact time as indicated for disinfecting solutions.

## 2021-03-06 NOTE — Addendum Note (Signed)
Addended by: Katharine Look on: 03/06/2021 11:35 AM   Modules accepted: Orders

## 2021-03-06 NOTE — Patient Instructions (Signed)
Please return in 6 months for recheck.   I will release your lab results to you on your MyChart account with further instructions. Please reply with any questions.    Start otc Vitamin D 2000 IU daily.   If you have any questions or concerns, please don't hesitate to send me a message via MyChart or call the office at [phone]. Thank you for visiting with [place] today! It's our pleasure caring for you.

## 2021-03-07 ENCOUNTER — Encounter: Payer: Self-pay | Admitting: Physical Medicine & Rehabilitation

## 2021-03-07 ENCOUNTER — Encounter
Payer: BC Managed Care – PPO | Attending: Physical Medicine & Rehabilitation | Admitting: Physical Medicine & Rehabilitation

## 2021-03-07 ENCOUNTER — Other Ambulatory Visit: Payer: Self-pay

## 2021-03-07 VITALS — BP 138/98 | HR 93 | Temp 98.5°F | Ht 64.0 in | Wt 276.4 lb

## 2021-03-07 DIAGNOSIS — M47816 Spondylosis without myelopathy or radiculopathy, lumbar region: Secondary | ICD-10-CM

## 2021-03-07 DIAGNOSIS — K5909 Other constipation: Secondary | ICD-10-CM | POA: Diagnosis not present

## 2021-03-07 MED ORDER — NORTRIPTYLINE HCL 10 MG PO CAPS: 10.0000 mg | ORAL_CAPSULE | Freq: Every day | ORAL | 1 refills | Status: DC

## 2021-03-07 NOTE — Progress Notes (Signed)
Subjective:    Patient ID: [name] [name], female    DOB: [birth year], 35 y.o.   MRN: [id number] Note Status: Signed Cosign: Cosign Not Required Encounter Date: 08/10/2020  Editor: Erick Colace, MD (Physician)                Bilateral Lumbar L3, L4  medial branch blocks and L 5 dorsal ramus injection under fluoroscopic guidance      Note Status: Signed Cosign: Cosign Not Required Encounter Date: 08/30/2020  Editor: Erick Colace, MD (Physician)                Bilateral Lumbar L3, L4  medial branch blocks and L 5 dorsal ramus injection under fluoroscopic guidance      Shafiq Larch, Victorino Sparrow, MD Author Type: Physician Filed: 11/27/2020  4:45 PM   Note Status: Signed Cosign: Cosign Not Required Encounter Date: 11/27/2020  Editor: Erick Colace, MD (Physician)                Left L5 dorsal ramus., left L4 and left L3 medial branch radio frequency neurotomy under fluoroscopic guidance      Note Status: Signed Cosign: Cosign Not Required Encounter Date: 10/02/2020  Editor: Erick Colace, MD (Physician)                RightL5 dorsal ramus., Right L4 and Right L3 medial branch radio frequency neurotomy under fluoroscopic guidance      HPI  35 year old female with history of fibromyalgia as well as morbid obesity and lumbar spondylosis.  Her low back pain has been very mild since bilateral L3-L4-L5 radiofrequency neurotomies.  She does have some mid back pain which the t manages by trying to sit up straight. Hip pain increases with walking and sitting  Hip pain is lateral not in the groin area. In addition patient has chronic bilateral foot pain and sees podiatry for this. She complains of excessive weight gain with Lyrica.  She is not exercising outside of physical therapy. She has tried duloxetine as well as gabapentin in the past without significant improvements. He is not sure whether she has tried nortriptyline in the past.  We discussed potential  QTC interval increase with this medicine although would be a similar interaction has with duloxetine which she tolerated past.  She does have a normal EKG from approximately 1 year ago, at that time she was on both Seroquel and lithium. Pain Inventory Average Pain 6 Pain Right Now 6 My pain is constant, sharp, dull, and aching  In the last 24 hours, has pain interfered with the following? General activity 5 Relation with others 6 Enjoyment of life 6 What TIME of day is your pain at its worst? evening Sleep (in general) Good  Pain is worse with: walking, bending, sitting, standing, and some activites Pain improves with: therapy/exercise and medication Relief from Meds: 5  Family History  Problem Relation Age of Onset   Hyperlipidemia Father    Hypertension Father    Kidney disease Father    Colon cancer Father 40   Cancer Father    Depression Father    Anxiety disorder Father    Bipolar disorder Father    Sleep apnea Father    Obesity Father    Hypertension Mother    Depression Mother    Anxiety disorder Mother    Obesity Mother    Hypertension Maternal Grandmother    Uterine cancer Maternal Grandmother 13   Diabetes Maternal Grandfather  Social History   Socioeconomic History   Marital status: Married    Spouse name: Not on file   Number of children: Not on file   Years of education: Not on file   Highest education level: Not on file  Occupational History   Occupation: stay at home mom  Tobacco Use   Smoking status: Never   Smokeless tobacco: Never  Vaping Use   Vaping Use: Never used  Substance and Sexual Activity   Alcohol use: Not Currently    Comment: rare   Drug use: No   Sexual activity: Yes    Birth control/protection: Surgical    Comment: tubal ligation  Other Topics Concern   Not on file  Social History Narrative   Married, lives with spouse and dtr born 07/2009. Employed as Associate Professor at [employer].   Social Determinants of Health   Financial  Resource Strain: Not on file  Food Insecurity: Not on file  Transportation Needs: Not on file  Physical Activity: Not on file  Stress: Not on file  Social Connections: Not on file   Past Surgical History:  Procedure Laterality Date   Quintella Reichert OSTEOTOMY Left 06/24/2017   Procedure: Quintella Reichert OSTEOTOMY;  Surgeon: [name], DPM;  Location: [facility];  Service: Podiatry;  Laterality: Left;   BUNIONECTOMY Left 06/24/2017   Procedure: Anthoney Harada;  Surgeon: [name], DPM;  Location: [facility];  Service: Podiatry;  Laterality: Left;   CESAREAN SECTION     x 2   CHOLECYSTECTOMY N/A 02/15/2019   Procedure: LAPAROSCOPIC CHOLECYSTECTOMY WITH INTRAOPERATIVE CHOLANGIOGRAM;  Surgeon: Andria Meuse, MD;  Location: [facility];  Service: General;  Laterality: N/A;   COLON RESECTION N/A 05/04/2020   Procedure: DIAGNOSTIC LAPAROSCOPY, RESECTION OF CANDYCANE, UPPER ENDOSCOPY;  Surgeon: Berna Bue, MD;  Location: [facility];  Service: General;  Laterality: N/A;   FOOT SURGERY     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   Procedure: LAPAROSCOPIC ROUX-EN-Y GASTRIC;  Surgeon: Atilano Ina, MD;  Location: [facility];  Service: General;  Laterality: N/A;  laparoscopic roux-en-y gastric bypass   LITHOTRIPSY Left    TONSILLECTOMY     TUBAL LIGATION     UPPER GI ENDOSCOPY  08/24/2012   Procedure: UPPER GI ENDOSCOPY;  Surgeon: Atilano Ina, MD;  Location: [facility];  Service: General;;   WISDOM TOOTH EXTRACTION     Past Surgical History:  Procedure Laterality Date   Quintella Reichert OSTEOTOMY Left 06/24/2017   Procedure: Ralene Bathe;  Surgeon: [name], DPM;  Location: [facility];  Service: Podiatry;  Laterality: Left;   BUNIONECTOMY Left 06/24/2017   Procedure: Anthoney Harada;  Surgeon: [name], DPM;  Location: [facility];  Service: Podiatry;  Laterality: Left;   CESAREAN SECTION     x 2   CHOLECYSTECTOMY N/A 02/15/2019    Procedure: LAPAROSCOPIC CHOLECYSTECTOMY WITH INTRAOPERATIVE CHOLANGIOGRAM;  Surgeon: Andria Meuse, MD;  Location: [facility];  Service: General;  Laterality: N/A;   COLON RESECTION N/A 05/04/2020   Procedure: DIAGNOSTIC LAPAROSCOPY, RESECTION OF CANDYCANE, UPPER ENDOSCOPY;  Surgeon: Berna Bue, MD;  Location: [facility];  Service: General;  Laterality: N/A;   FOOT SURGERY     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   Procedure: LAPAROSCOPIC ROUX-EN-Y GASTRIC;  Surgeon: Atilano Ina, MD;  Location: [facility];  Service: General;  Laterality: N/A;  laparoscopic roux-en-y gastric bypass   LITHOTRIPSY Left    TONSILLECTOMY  TUBAL LIGATION     UPPER GI ENDOSCOPY  08/24/2012   Procedure: UPPER GI ENDOSCOPY;  Surgeon: Atilano Ina, MD;  Location: [facility];  Service: General;;   WISDOM TOOTH EXTRACTION     Past Medical History:  Diagnosis Date   ADD (attention deficit disorder)    Anemia    Anxiety    B12 deficiency    Back pain    Bilateral swelling of feet    Bipolar 2 disorder (HCC) 09/19/2019   Constipation    DEPRESSION    Elevated cholesterol    Family history of colon cancer June 04, 2019   Father, 66s; deceased.    Fatty liver    Fibromyalgia    Gallbladder problem    GERD (gastroesophageal reflux disease)    Heartburn    History of stomach ulcers    Hypertension    Hypothyroidism    Hx of, normalized TSH during pregnancy 2011   Infertility, female    Lactose intolerance    Migraines    Morbid obesity (HCC)    s/p RY 08/2012 - start weight 290 pounds   Nephrolithiasis    Palpitations    Panic disorder    PCOS (polycystic ovarian syndrome)    PONV (postoperative nausea and vomiting)    Pregnancy induced hypertension    Shortness of breath    Vitamin D deficiency    BP (!) 138/98   Pulse 93   Temp 98.5 F (36.9 C)   Ht 5\' 4"  (1.626 m)   Wt 276 lb 6.4 oz (125.4 kg)   SpO2 97%   BMI 47.44 kg/m   Opioid Risk Score:   Fall Risk Score:  `1  Depression screen PHQ  2/9  Depression screen [facility] 2/9 03/07/2021 03/07/2021 11/06/2020 08/30/2020 07/13/2020 03/12/2020 12/07/2019  Decreased Interest 1 0 0 2 2 3 3   Down, Depressed, Hopeless 1 0 0 2 2 3 3   PHQ - 2 Score 2 0 0 4 4 6 6   Altered sleeping - - 0 - 3 1 1   Tired, decreased energy - - 2 - 3 3 3   Change in appetite - - 2 - - 3 3  Feeling bad or failure about yourself  - - 0 - 2 3 3   Trouble concentrating - - 0 - - 3 3  Moving slowly or fidgety/restless - - 0 - 1 3 1   Suicidal thoughts - - 0 - 0 1 2  PHQ-9 Score - - 4 - 13 23 22   Difficult doing work/chores - - Not difficult at all - Extremely dIfficult Extremely dIfficult Extremely dIfficult  Some recent data might be hidden     Review of Systems  Constitutional: Negative.   HENT: Negative.    Eyes: Negative.   Respiratory: Negative.    Cardiovascular: Negative.   Gastrointestinal: Negative.   Endocrine: Negative.   Genitourinary:  Positive for dysuria.       Currenlty on abx for UTI  Musculoskeletal:  Positive for back pain and neck pain.  Skin: Negative.   Allergic/Immunologic: Negative.   Neurological:  Positive for tremors.       Hands shake  Hematological: Negative.   Psychiatric/Behavioral: Negative.    All other systems reviewed and are negative.     Objective:   Physical Exam Vitals and nursing note reviewed.  Constitutional:      Appearance: She is obese.  HENT:     Head: Normocephalic and atraumatic.  Eyes:     Extraocular Movements: Extraocular  movements intact.     Conjunctiva/sclera: Conjunctivae normal.     Pupils: Pupils are equal, round, and reactive to light.  Musculoskeletal:     Comments: Ambulates without assistive device no evidence of toe drag or knee instability She has tenderness palpation over the bilateral greater trochanter of the hip. There is no tenderness palpation the lumbar paraspinal area there is mild tenderness palpation thoracic paraspinal area.  Mild pain with palpation bilateral upper traps   Neurological:     General: No focal deficit present.     Mental Status: She is alert and oriented to person, place, and time.     Comments: Motor strength is 5/5 bilateral hip flexor knee extensor ankle dorsiflexor 5/5 bilateral deltoid bicep tricep grip  Tone is normal bilateral upper extremities Sensation normal bilateral upper and lower extremities   Psychiatric:        Mood and Affect: Mood normal.        Behavior: Behavior normal.        Thought Content: Thought content normal.        Judgment: Judgment normal.          Assessment & Plan:   1.  Chronic low back pain, lumbar spondylosis improved after bilateral L3-L4-L5 radiofrequency neurotomies.  Patient is to notify office once her low back pain starts increasing once again, may need repeat procedures in the next 6 months or so. 2.  Fibromyalgia syndrome wishes to try another agent due to weight gain which she attributes to Lyrica.  We will reduce Lyrica 100 mg/day x 1 week then discontinue, same time start nortriptyline 10 mg nightly this can be titrated upward slowly.

## 2021-03-07 NOTE — Patient Instructions (Signed)
Reduce lyrica to one tab a day for 1 wk then stop  Lithium and seroquel can potentially prolonged  QT interval  as can Lithium and nortriptylie you had a normal EKG QT interval last year   Please call for hip injection or repeat low back injection

## 2021-03-11 LAB — CYTOLOGY - PAP: Diagnosis: NEGATIVE

## 2021-03-19 ENCOUNTER — Encounter (HOSPITAL_COMMUNITY): Payer: Self-pay | Admitting: Radiology

## 2021-03-21 ENCOUNTER — Other Ambulatory Visit (HOSPITAL_BASED_OUTPATIENT_CLINIC_OR_DEPARTMENT_OTHER): Payer: Self-pay

## 2021-03-21 MED ORDER — HYDROCOD POLST-CPM POLST ER 10-8 MG/5ML PO SUER
ORAL | 0 refills | Status: DC
  Filled 2021-03-21: qty 70, 14d supply, fill #0

## 2021-03-21 MED ORDER — PREDNISONE 20 MG PO TABS
ORAL_TABLET | ORAL | 0 refills | Status: DC
  Filled 2021-03-21: qty 10, 5d supply, fill #0

## 2021-03-27 ENCOUNTER — Other Ambulatory Visit: Payer: Self-pay | Admitting: Family Medicine

## 2021-03-27 ENCOUNTER — Encounter: Payer: Self-pay | Admitting: Family Medicine

## 2021-03-27 ENCOUNTER — Other Ambulatory Visit: Payer: Self-pay

## 2021-03-27 MED ORDER — CLONAZEPAM 1 MG PO TABS: 0.5000 mg | ORAL_TABLET | Freq: Two times a day (BID) | ORAL | 1 refills | Status: DC | PRN

## 2021-03-27 NOTE — Telephone Encounter (Signed)
Rx refill request approved per Dr. Corey's orders. 

## 2021-03-27 NOTE — Progress Notes (Unsigned)
Last Refill 01/09/2021 Last OV 03/06/2021 dx annual physical

## 2021-04-02 ENCOUNTER — Telehealth: Payer: BC Managed Care – PPO | Admitting: Nurse Practitioner

## 2021-04-02 DIAGNOSIS — H9201 Otalgia, right ear: Secondary | ICD-10-CM | POA: Diagnosis not present

## 2021-04-02 DIAGNOSIS — R051 Acute cough: Secondary | ICD-10-CM

## 2021-04-02 MED ORDER — PROMETHAZINE-DM 6.25-15 MG/5ML PO SYRP
5.0000 mL | ORAL_SOLUTION | Freq: Four times a day (QID) | ORAL | 0 refills | Status: DC | PRN
Start: 2021-04-02 — End: 2021-05-07

## 2021-04-02 MED ORDER — AMOXICILLIN-POT CLAVULANATE 875-125 MG PO TABS: 1.0000 | ORAL_TABLET | Freq: Two times a day (BID) | ORAL | 0 refills | Status: DC

## 2021-04-02 NOTE — Progress Notes (Signed)
Virtual Visit Consent   [name] [name], you are scheduled for a virtual visit with Mary-Margaret Daphine Deutscher, FNP, a [facility] provider, today.     Just as with appointments in the office, your consent must be obtained to participate.  Your consent will be active for this visit and any virtual visit you may have with one of our providers in the next 365 days.     If you have a MyChart account, a copy of this consent can be sent to you electronically.  All virtual visits are billed to your insurance company just like a traditional visit in the office.    As this is a virtual visit, video technology does not allow for your provider to perform a traditional examination.  This may limit your provider's ability to fully assess your condition.  If your provider identifies any concerns that need to be evaluated in person or the need to arrange testing (such as labs, EKG, etc.), we will make arrangements to do so.     Although advances in technology are sophisticated, we cannot ensure that it will always work on either your end or our end.  If the connection with a video visit is poor, the visit may have to be switched to a telephone visit.  With either a video or telephone visit, we are not always able to ensure that we have a secure connection.     I need to obtain your verbal consent now.   Are you willing to proceed with your visit today? YES   [name] [name] has provided verbal consent on 04/02/2021 for a virtual visit (video or telephone).   Mary-Margaret Daphine Deutscher, FNP   Date: 04/02/2021 6:49 PM   Virtual Visit via Video Note   I, Mary-Margaret Daphine Deutscher, connected with [name] [name] ([id number], 1986/03/01) on 04/02/21 at  7:00 PM EDT by a video-enabled telemedicine application and verified that I am speaking with the correct person using two identifiers.  Location: Patient: Virtual Visit Location Patient: Home Provider: Virtual Visit Location Provider: Mobile   I discussed the  limitations of evaluation and management by telemedicine and the availability of in person appointments. The patient expressed understanding and agreed to proceed.    History of Present Illness: [name] [name] is a 35 y.o. who identifies as a female who was assigned female at birth, and is being seen today for URI.  HPI: Patient has had a cough for the last 2 weeks. She has used tussinex and delsym which has helped her sleep but she is coughing all day. She is now vomiting from coughing so much. Has congestion and ear now. She went to urgent care on 03/21/21 and was given tussionex and prednisone.    Review of Systems  Constitutional:  Positive for malaise/fatigue. Negative for chills and fever.  HENT:  Positive for congestion and ear pain. Negative for sore throat.   Respiratory:  Positive for cough and sputum production.    Problems:  Patient Active Problem List   Diagnosis Date Noted   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   Plantar fasciitis 10/31/2019   Bipolar 2 disorder (HCC) 09/19/2019   Fibromyalgia 09/09/2019   Ischial bursitis of left side 07/19/2019   Vitamin B12 deficiency 06/01/2019   Vitamin D deficiency 06/01/2019   Panic disorder 06/01/2019   Family history of colon cancer 05/16/2019   Chronic constipation 05/16/2019   Major depression, recurrent, chronic (HCC) 05/16/2019   Insomnia due to other mental disorder 03/13/2019  Pain in both lower extremities 11/21/2018   Nocturnal leg cramps 10/26/2018   Nephrolithiasis    Reduced libido, due to Lexapro 07/09/2017   Binge eating disorder 04/23/2017   Acquired iron deficiency anemia due to decreased absorption 02/22/2015   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 , Disp: , Rfl:    baclofen (LIORESAL) 10 MG tablet, TAKE 1 TABLET BY MOUTH THREE TIMES A DAY AS NEEDED FOR MUSCLE SPASMS, Disp: 270 tablet, Rfl: 1   busPIRone (BUSPAR) 15 MG tablet, Take 15 mg by mouth 3 (three) times daily., Disp: , Rfl:    CALCIUM PO, Take 1 tablet by mouth daily., Disp: , Rfl:    chlorpheniramine-HYDROcodone (TUSSIONEX) 10-8 MG/5ML SUER, Take 5 mLs by mouth nightly as needed for up to 15 doses., Disp: 70 mL, Rfl: 0   clonazePAM (KLONOPIN) 1 MG tablet, Take 0.5 tablets (0.5 mg total) by mouth 2 (two) times daily as needed for anxiety., Disp: 60 tablet, Rfl: 1   clotrimazole (MYCELEX) 10 MG troche, Take 10 mg by mouth 5 (five) times daily., Disp: , Rfl:    cyanocobalamin (,VITAMIN B-12,) 1000 MCG/ML injection, Inject 1 vial per week for 3 weeks, then 1 vial per month thereafter, Disp: 6 mL, Rfl: 3   cyclobenzaprine (FLEXERIL) 10 MG tablet, Take 1 tablet (10 mg total) by mouth 3 (three) times daily as needed for muscle spasms., Disp: 45 tablet, Rfl: 0   diclofenac Sodium (VOLTAREN) 1 % GEL, Apply 4 g topically 4 (four) times daily. (Patient taking differently: Apply 4 g topically 4 (four) times daily as needed (pain).), Disp: 100 g, Rfl: 0   docusate sodium (COLACE) 100 MG capsule, Take 1 capsule (100 mg total) by mouth 2 (two) times daily., Disp: 30 capsule, Rfl: 2   hydrOXYzine (VISTARIL) 50 MG capsule, Take 50 mg by mouth every 6 (six) hours as needed., Disp: , Rfl:    lamoTRIgine (LAMICTAL) 150 MG tablet, Take 300 mg by mouth daily. , Disp: , Rfl:    lithium carbonate (LITHOBID) 300 MG CR tablet, Take 600 mg by mouth at bedtime., Disp: , Rfl:    Magnesium 500 MG TABS, Take  1 tablet by mouth daily., Disp: , Rfl:    metFORMIN (GLUCOPHAGE) 500 MG tablet, Take 500 mg by mouth 2 (two) times daily., Disp: , Rfl:    nortriptyline (PAMELOR) 10 MG capsule, Take 1 capsule (10 mg total) by mouth at bedtime., Disp: 30 capsule, Rfl: 1   ondansetron (ZOFRAN) 4 MG tablet, Take 1 tablet (4 mg total) by mouth every 6 (six) hours., Disp: 12 tablet, Rfl: 0   pantoprazole (PROTONIX) 20 MG tablet, Take 1 tablet (20 mg total) by mouth daily., Disp: 90 tablet, Rfl: 1   predniSONE (DELTASONE) 20 MG tablet, Take 2 tablets (40 mg total) in the mornings together with breakfast daily for 5 days, Disp: 10 tablet, Rfl: 0  pregabalin (LYRICA) 200 MG capsule, Take 1 capsule (200 mg total) by mouth 2 (two) times daily., Disp: 60 capsule, Rfl: 2   QUEtiapine (SEROQUEL) 400 MG tablet, Take 400 mg by mouth at bedtime. , Disp: , Rfl:    QUEtiapine (SEROQUEL) 50 MG tablet, Take by mouth., Disp: , Rfl:    SYRINGE-NEEDLE, DISP, 3 ML 25G X 1" 3 ML MISC, Use 1 needle per application once weekly, Disp: 12 each, Rfl: 0   tizanidine (ZANAFLEX) 2 MG capsule, Take 2 capsules (4 mg total) by mouth 3 (three) times daily as needed for muscle spasms., Disp: 540 capsule, Rfl: 1   valACYclovir (VALTREX) 1000 MG tablet, Take 1 tablet (1,000 mg total) by mouth 3 (three) times daily. (Patient taking differently: Take 1,000 mg by mouth 3 (three) times daily as needed (cold sores).), Disp: 21 tablet, Rfl: 0  Observations/Objective: Patient is well-developed, well-nourished in no acute distress.  Resting comfortably at home.  Head is normocephalic, atraumatic.  No labored breathing. Speech is clear and coherent with logical content.  Patient is alert and oriented at baseline.  No cough noted during visit  Assessment and Plan:  [name] [name] in today with chief complaint of URI   1. Right ear pain Flonase nasal spray that already hve- 2 sprays each nostril daily  2. Acute cough 1. Take meds as prescribed 2.  Use a cool mist humidifier especially during the winter months and when heat has been humid. 3. Use saline nose sprays frequently 4. Saline irrigations of the nose can be very helpful if done frequently.  * 4X daily for 1 week*  * Use of a nettie pot can be helpful with this. Follow directions with this* 5. Drink plenty of fluids 6. Keep thermostat turn down low 7.For any cough or congestion- use meds prescribed 8. For fever or aces or pains- take tylenol or ibuprofen appropriate for age and weight.  * for fevers greater than 101 orally you may alternate ibuprofen and tylenol every  3 hours.    - amoxicillin-clavulanate (AUGMENTIN) 875-125 MG tablet; Take 1 tablet by mouth 2 (two) times daily.  Dispense: 14 tablet; Refill: 0 - promethazine-dextromethorphan (PROMETHAZINE-DM) 6.25-15 MG/5ML syrup; Take 5 mLs by mouth 4 (four) times daily as needed for cough.  Dispense: 118 mL; Refill: 0     Follow Up Instructions: I discussed the assessment and treatment plan with the patient. The patient was provided an opportunity to ask questions and all were answered. The patient agreed with the plan and demonstrated an understanding of the instructions.  A copy of instructions were sent to the patient via MyChart.  The patient was advised to call back or seek an in-person evaluation if the symptoms worsen or if the condition fails to improve as anticipated.  Time:  I spent 10 minutes with the patient via telehealth technology discussing the above problems/concerns.    Mary-Margaret Daphine Deutscher, FNP

## 2021-04-03 ENCOUNTER — Other Ambulatory Visit: Payer: Self-pay | Admitting: Physical Medicine & Rehabilitation

## 2021-04-05 ENCOUNTER — Emergency Department (HOSPITAL_BASED_OUTPATIENT_CLINIC_OR_DEPARTMENT_OTHER)
Admission: EM | Admit: 2021-04-05 | Discharge: 2021-04-05 | Disposition: A | Discharge status: Home / Self Care | Payer: BC Managed Care – PPO | Attending: Emergency Medicine | Admitting: Emergency Medicine

## 2021-04-05 ENCOUNTER — Other Ambulatory Visit: Payer: Self-pay

## 2021-04-05 ENCOUNTER — Encounter (HOSPITAL_BASED_OUTPATIENT_CLINIC_OR_DEPARTMENT_OTHER): Payer: Self-pay | Admitting: Emergency Medicine

## 2021-04-05 ENCOUNTER — Emergency Department (HOSPITAL_BASED_OUTPATIENT_CLINIC_OR_DEPARTMENT_OTHER): Payer: BC Managed Care – PPO

## 2021-04-05 DIAGNOSIS — E039 Hypothyroidism, unspecified: Secondary | ICD-10-CM | POA: Diagnosis not present

## 2021-04-05 DIAGNOSIS — Z20822 Contact with and (suspected) exposure to covid-19: Secondary | ICD-10-CM | POA: Diagnosis not present

## 2021-04-05 DIAGNOSIS — R1013 Epigastric pain: Secondary | ICD-10-CM | POA: Diagnosis not present

## 2021-04-05 DIAGNOSIS — Z79899 Other long term (current) drug therapy: Secondary | ICD-10-CM | POA: Diagnosis not present

## 2021-04-05 DIAGNOSIS — I10 Essential (primary) hypertension: Secondary | ICD-10-CM | POA: Insufficient documentation

## 2021-04-05 DIAGNOSIS — H9202 Otalgia, left ear: Secondary | ICD-10-CM | POA: Insufficient documentation

## 2021-04-05 DIAGNOSIS — D72829 Elevated white blood cell count, unspecified: Secondary | ICD-10-CM | POA: Insufficient documentation

## 2021-04-05 DIAGNOSIS — R112 Nausea with vomiting, unspecified: Secondary | ICD-10-CM | POA: Insufficient documentation

## 2021-04-05 LAB — CBC WITH DIFFERENTIAL/PLATELET
Abs Immature Granulocytes: 0.04 10*3/uL (ref 0.00–0.07)
Basophils Absolute: 0 10*3/uL (ref 0.0–0.1)
Basophils Relative: 0 %
Eosinophils Absolute: 0.1 10*3/uL (ref 0.0–0.5)
Eosinophils Relative: 1 %
HCT: 40.8 % (ref 36.0–46.0)
Hemoglobin: 13.2 g/dL (ref 12.0–15.0)
Immature Granulocytes: 0 %
Lymphocytes Relative: 19 %
Lymphs Abs: 2.2 10*3/uL (ref 0.7–4.0)
MCH: 27.2 pg (ref 26.0–34.0)
MCHC: 32.4 g/dL (ref 30.0–36.0)
MCV: 84 fL (ref 80.0–100.0)
Monocytes Absolute: 0.6 10*3/uL (ref 0.1–1.0)
Monocytes Relative: 5 %
Neutro Abs: 8.5 10*3/uL — ABNORMAL HIGH (ref 1.7–7.7)
Neutrophils Relative %: 75 %
Platelets: 371 10*3/uL (ref 150–400)
RBC: 4.86 MIL/uL (ref 3.87–5.11)
RDW: 15.2 % (ref 11.5–15.5)
WBC: 11.5 10*3/uL — ABNORMAL HIGH (ref 4.0–10.5)
nRBC: 0 % (ref 0.0–0.2)

## 2021-04-05 LAB — COMPREHENSIVE METABOLIC PANEL
ALT: 9 U/L (ref 0–44)
AST: 11 U/L — ABNORMAL LOW (ref 15–41)
Albumin: 4.4 g/dL (ref 3.5–5.0)
Alkaline Phosphatase: 127 U/L — ABNORMAL HIGH (ref 38–126)
Anion gap: 11 (ref 5–15)
BUN: 9 mg/dL (ref 6–20)
CO2: 25 mmol/L (ref 22–32)
Calcium: 9.3 mg/dL (ref 8.9–10.3)
Chloride: 104 mmol/L (ref 98–111)
Creatinine, Ser: 0.77 mg/dL (ref 0.44–1.00)
GFR, Estimated: >60 mL/min (ref >60)
Glucose, Bld: 82 mg/dL (ref 70–99)
Potassium: 3.7 mmol/L (ref 3.5–5.1)
Sodium: 140 mmol/L (ref 135–145)
Total Bilirubin: 0.2 mg/dL — ABNORMAL LOW (ref 0.3–1.2)
Total Protein: 7.1 g/dL (ref 6.5–8.1)

## 2021-04-05 LAB — URINALYSIS, ROUTINE W REFLEX MICROSCOPIC
Bilirubin Urine: NEGATIVE
Glucose, UA: NEGATIVE mg/dL
Ketones, ur: NEGATIVE mg/dL
Leukocytes,Ua: NEGATIVE
Nitrite: NEGATIVE
Protein, ur: 30 mg/dL — AB
Specific Gravity, Urine: 1.035 — ABNORMAL HIGH (ref 1.005–1.030)
pH: 5.5 (ref 5.0–8.0)

## 2021-04-05 LAB — PREGNANCY, URINE: Preg Test, Ur: NEGATIVE

## 2021-04-05 LAB — RESP PANEL BY RT-PCR (FLU A&B, COVID) ARPGX2
Influenza A by PCR: NEGATIVE
Influenza B by PCR: NEGATIVE
SARS Coronavirus 2 by RT PCR: NEGATIVE

## 2021-04-05 LAB — LIPASE, BLOOD: Lipase: 21 U/L (ref 11–51)

## 2021-04-05 IMAGING — CT CT ABD-PELV W/ CM
2 of 4 series · 16 of 46 positions shown, 18 images · IV contrast (APPLIED)
Comparison: CT [DATE], [DATE], [DATE]

CLINICAL DATA: Epigastric pain

EXAM:
CT ABDOMEN AND PELVIS WITH CONTRAST
TECHNIQUE: Multidetector CT imaging of the abdomen and pelvis was performed
using the standard protocol following bolus administration of
intravenous contrast.
CONTRAST:  100mL OMNIPAQUE IOHEXOL 300 MG/ML  SOLN

[Series 2: abd pel w · axial · 0.86mm/px · z∈[-996,-531]mm · 13 of 103 slices shown, 15 images]
[im 5/103  soft-tissue]
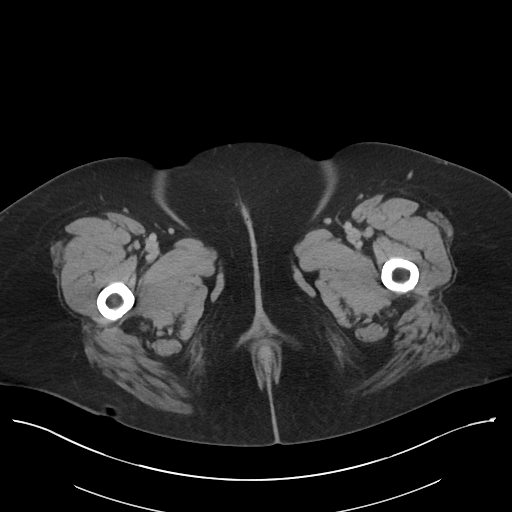
[im 5/103  bone]
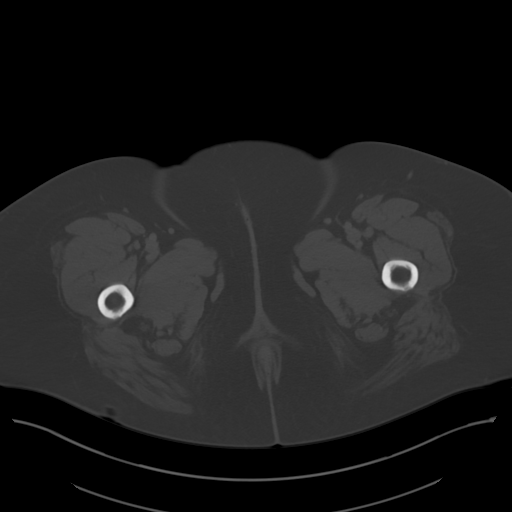
[im 13/103  soft-tissue]
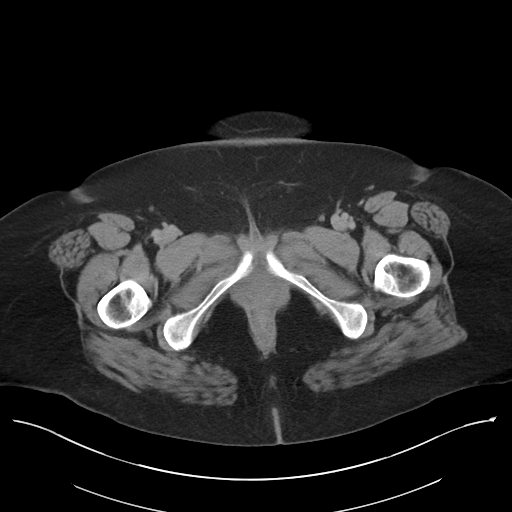
[im 21/103  soft-tissue]
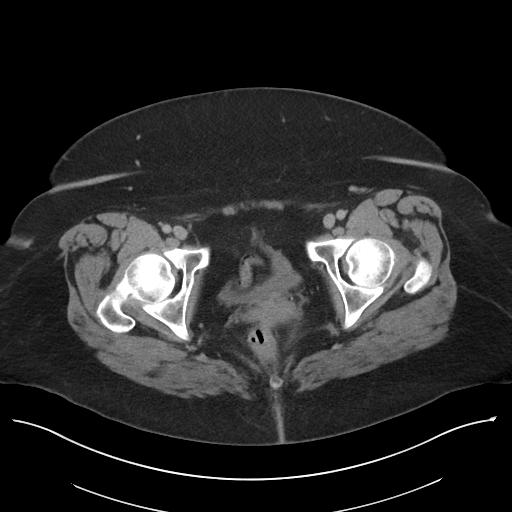
[im 29/103  soft-tissue]
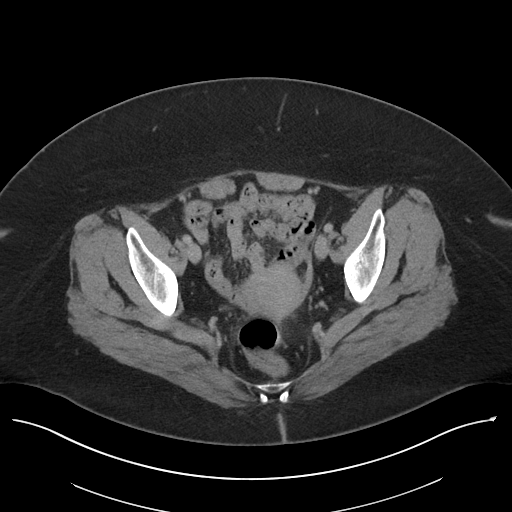
[im 37/103  soft-tissue]
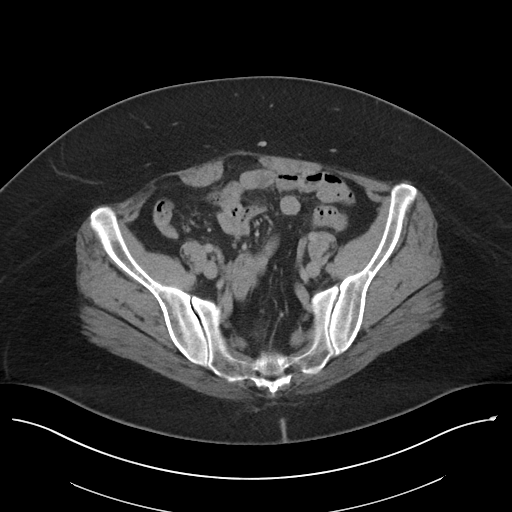
[im 45/103  soft-tissue]
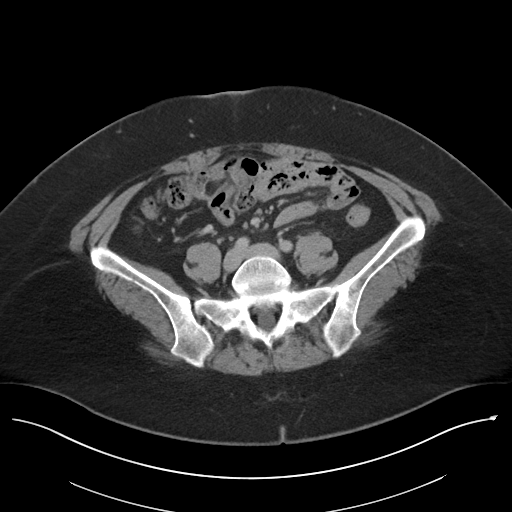
[im 54/103  soft-tissue]
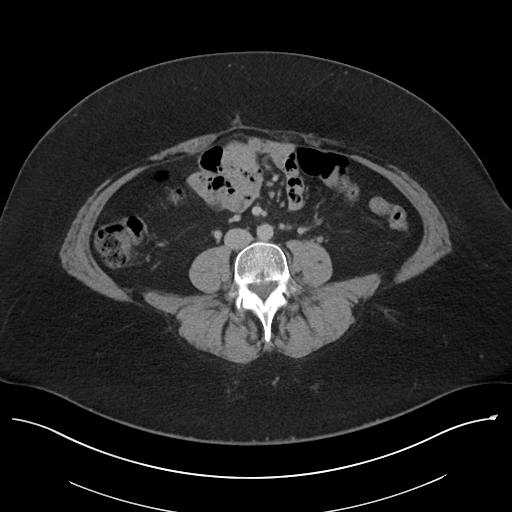
[im 58/103  soft-tissue]
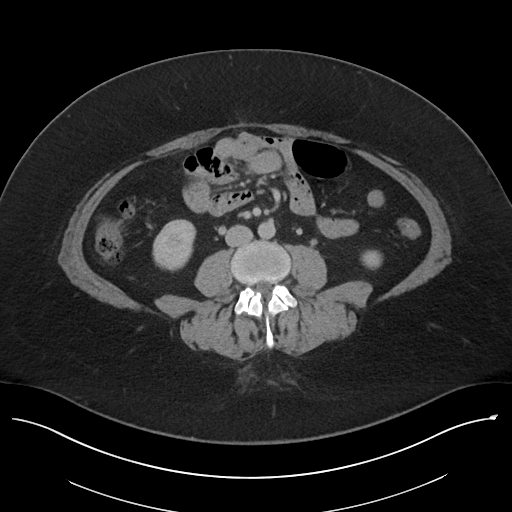
[im 66/103  soft-tissue]
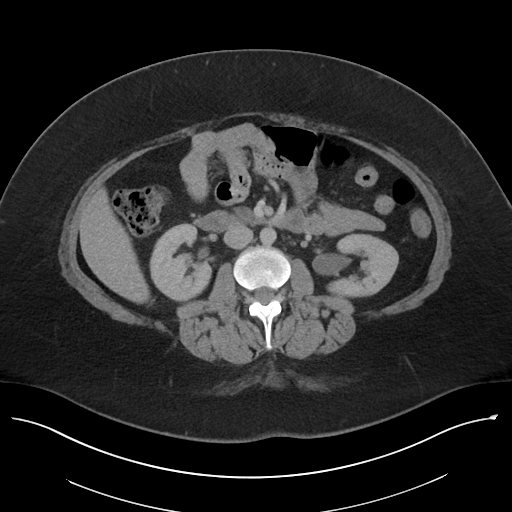
[im 66/103  bone]
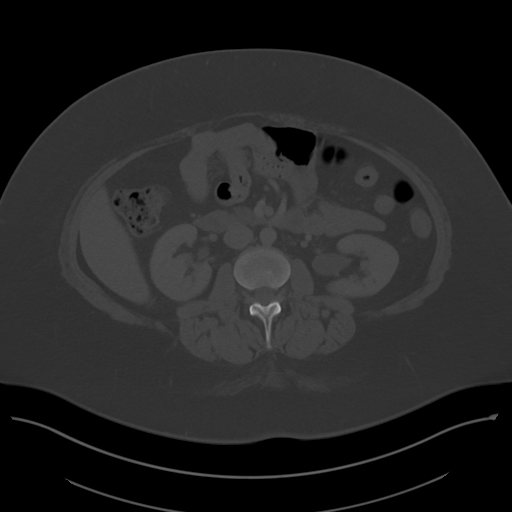
[im 74/103  soft-tissue]
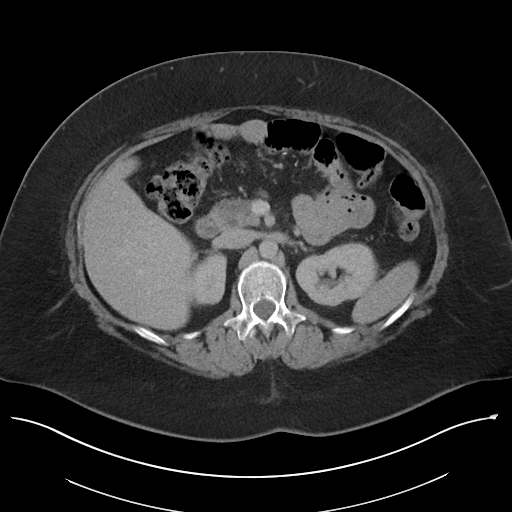
[im 82/103  soft-tissue]
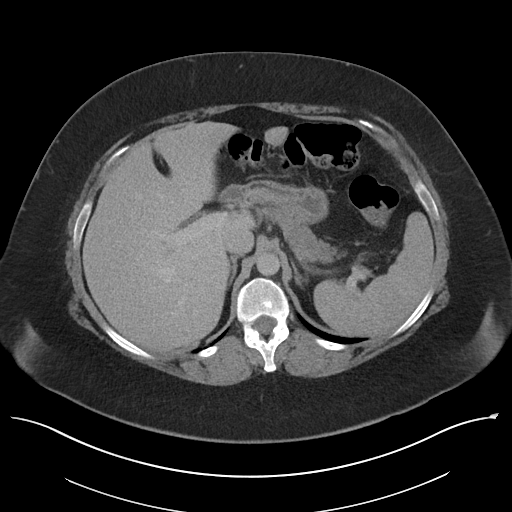
[im 90/103  soft-tissue]
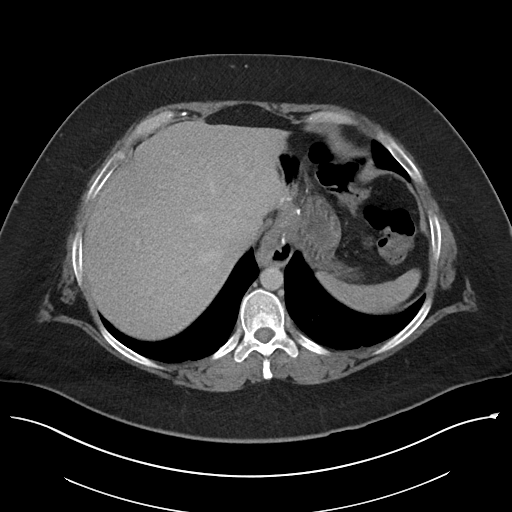
[im 98/103  soft-tissue]
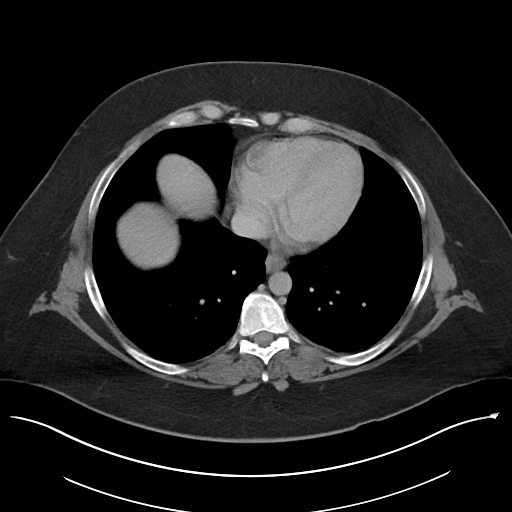

[Series 5: coronal · coronal · 0.75mm/px · 3 of 106 slices shown]
[im 36/106  soft-tissue]
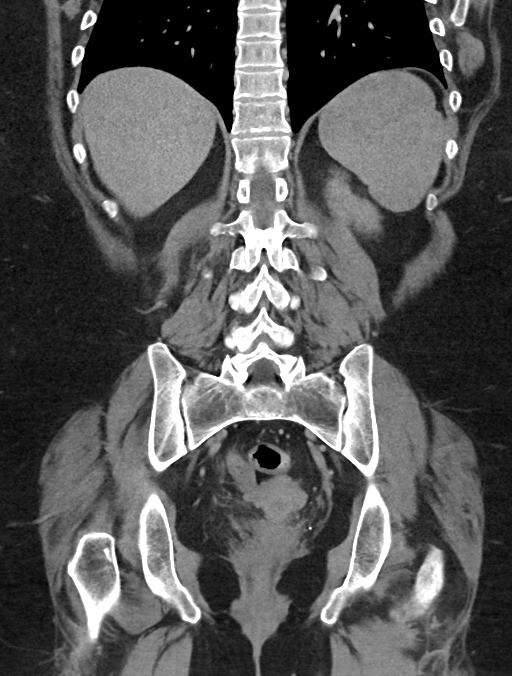
[im 47/106  soft-tissue]
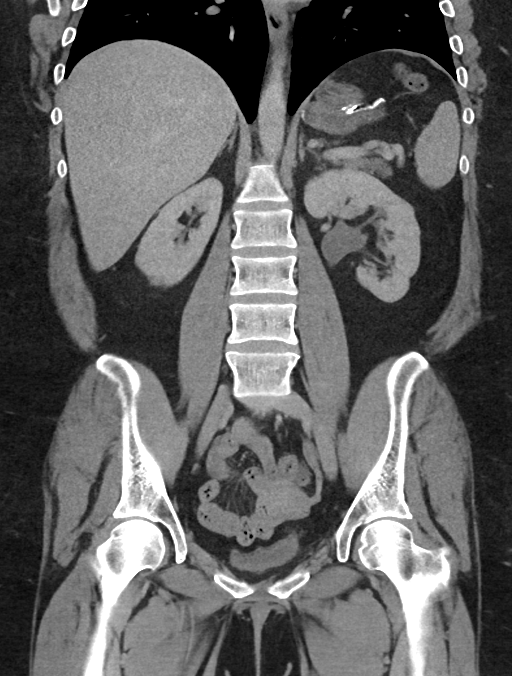
[im 59/106  soft-tissue]
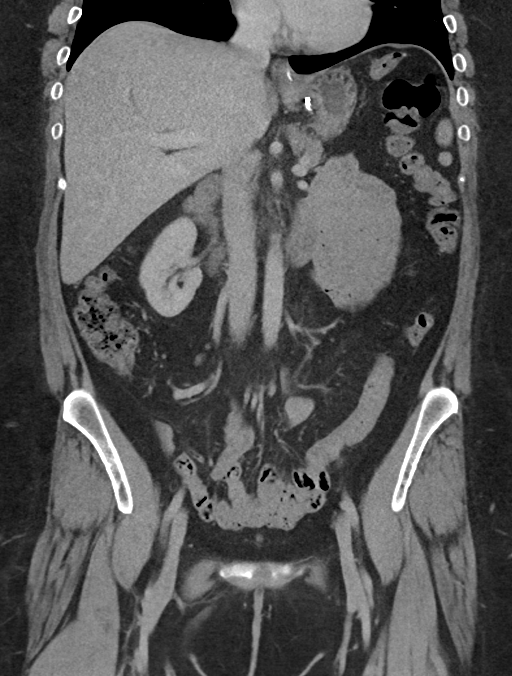

[16 of 46 positions shown; findings below may reference images not displayed]

FINDINGS: Lower chest: Lung bases demonstrate no acute consolidation or
effusion. Normal cardiac size.

Hepatobiliary: No focal liver abnormality is seen. Status post
cholecystectomy. No biliary dilatation.

Pancreas: Unremarkable. No pancreatic ductal dilatation or
surrounding inflammatory changes.

Spleen: Normal in size without focal abnormality.

Adrenals/Urinary Tract: Adrenal glands are normal. Prominent renal
pelvises without hydronephrosis or hydroureter. The bladder is
unremarkable.

Stomach/Bowel: Status post gastric bypass. No obstructive changes.
No acute bowel wall thickening. Negative appendix.

Vascular/Lymphatic: No significant vascular findings are present. No
enlarged abdominal or pelvic lymph nodes.

Reproductive: Uterus and bilateral adnexa are unremarkable.

Other: Negative for free air or free fluid. Small fat containing
umbilical and periumbilical hernias.

Musculoskeletal: No acute or significant osseous findings.
IMPRESSION: 1. No CT evidence for acute intra-abdominopelvic abnormality.
2. Status post gastric bypass.  No evidence for obstruction.

## 2021-04-05 MED ORDER — PROCHLORPERAZINE EDISYLATE 10 MG/2ML IJ SOLN: 10.0000 mg | Freq: Once | INTRAMUSCULAR | Status: DC

## 2021-04-05 MED ORDER — DEXTROMETHORPHAN POLISTIREX ER 30 MG/5ML PO SUER: 30.0000 mg | Freq: Once | ORAL | Status: DC

## 2021-04-05 MED ORDER — HYDROCOD POLST-CPM POLST ER 10-8 MG/5ML PO SUER
5.0000 mL | Freq: Once | ORAL | Status: AC
Start: 2021-04-05 — End: 2021-04-05
  Administered 2021-04-05: 5 mL via ORAL
  Filled 2021-04-05: qty 5

## 2021-04-05 MED ORDER — SODIUM CHLORIDE 0.9 % IV BOLUS
500.0000 mL | Freq: Once | INTRAVENOUS | Status: AC
  Administered 2021-04-05: 500 mL via INTRAVENOUS

## 2021-04-05 MED ORDER — ONDANSETRON HCL 4 MG/2ML IJ SOLN
4.0000 mg | Freq: Once | INTRAMUSCULAR | Status: AC
  Administered 2021-04-05: 4 mg via INTRAVENOUS
  Filled 2021-04-05: qty 2

## 2021-04-05 MED ORDER — IOHEXOL 300 MG/ML  SOLN
100.0000 mL | Freq: Once | INTRAMUSCULAR | Status: AC | PRN
  Administered 2021-04-05: 100 mL via INTRAVENOUS

## 2021-04-05 NOTE — Discharge Instructions (Addendum)
You were seen and evaluated in the emergency room today for evaluation of cough and vomiting.  As we discussed, this is likely which is causing your cough.  I am unsure exactly what is causing your vomiting at this time but all of the emergent causes were ruled out.  Please use Delsym for cough in the day.  He can also continue to use promethazine at home.  Diffuser with ropinirole was also a good option.  Please follow-up with your primary care provider over the next week or 2 to ensure we are going the right direction.  Please return to the emergency department sooner if you experience worsening cough, profuse vomiting, new abdominal pain, fever, or any concern you may have.

## 2021-04-05 NOTE — ED Notes (Signed)
Pt ambulated to bathroom to provide urine specimen, gait steady. Urine specimen obtained and sent to lab.  

## 2021-04-05 NOTE — ED Triage Notes (Signed)
Pt reports earache for 2-3 weeks, along with nausea and vomiting for 1 week, worse today, with epigastric pain.

## 2021-04-05 NOTE — ED Provider Notes (Signed)
[facility] EMERGENCY DEPT Provider Note   CSN: [id number] Arrival date & time: 04/05/21  1553     History Chief Complaint  Patient presents with   Otalgia   Abdominal Pain    [name] [name] is a 35 y.o. female with history of nephrolithiasis, gastric bypass, cholecystectomy, and chronically enlarged pancreas who presents to the emergency department with intractable nausea, vomiting, and epigastric abdominal pain over the last few days.  It is been constant and worsening.  She is unable to tolerate any p.o. foods.  She was recently seen and evaluated at urgent care on 6 October for an ongoing cough and left ear pain.  She was placed on Keflex and Tessalon Perles for the cough which did not seem to improve.  She was again seen via telehealth a few days ago and was placed on Augmentin and promethazine again with little improvement.  Still complaining of left ear pain and dry cough.  She denies any chest pain or shortness of breath, fever, chills.  She does report associated decreased urinary output and diarrhea. Zofran and Tussionex have offered no improvement on her vomiting. She rates her abdominal pain moderate in severity.  The history is provided by the patient. No language interpreter was used.  Otalgia Associated symptoms: abdominal pain   Abdominal Pain     Past Medical History:  Diagnosis Date   ADD (attention deficit disorder)    Anemia    Anxiety    B12 deficiency    Back pain    Bilateral swelling of feet    Bipolar 2 disorder (HCC) 09/19/2019   Constipation    DEPRESSION    Elevated cholesterol    Family history of colon cancer 05/24/19   Father, 44s; deceased.    Fatty liver    Fibromyalgia    Gallbladder problem    GERD (gastroesophageal reflux disease)    Heartburn    History of stomach ulcers    Hypertension    Hypothyroidism    Hx of, normalized TSH during pregnancy 2011   Infertility, female    Lactose intolerance    Migraines     Morbid obesity (HCC)    s/p RY 08/2012 - start weight 290 pounds   Nephrolithiasis    Palpitations    Panic disorder    PCOS (polycystic ovarian syndrome)    PONV (postoperative nausea and vomiting)    Pregnancy induced hypertension    Shortness of breath    Vitamin D deficiency     Patient Active Problem List   Diagnosis Date Noted   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   Plantar fasciitis 10/31/2019   Bipolar 2 disorder (HCC) 09/19/2019   Fibromyalgia 09/09/2019   Ischial bursitis of left side 07/19/2019   Vitamin B12 deficiency 06/01/2019   Vitamin D deficiency 06/01/2019   Panic disorder 06/01/2019   Family history of colon cancer 2019/05/24   Chronic constipation May 24, 2019   Major depression, recurrent, chronic (HCC) 24-May-2019   Insomnia due to other mental disorder 03/13/2019   Pain in both lower extremities 11/21/2018   Nocturnal leg cramps 10/26/2018   Nephrolithiasis    Reduced libido, due to Lexapro 07/09/2017   Binge eating disorder 04/23/2017   Acquired iron deficiency anemia due to decreased absorption 02/22/2015   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    Past Surgical History:  Procedure Laterality Date   Quintella Reichert OSTEOTOMY Left 06/24/2017   Procedure: Quintella Reichert OSTEOTOMY;  Surgeon: [name], DPM;  Location:  [facility];  Service: Podiatry;  Laterality: Left;   BUNIONECTOMY Left 06/24/2017   Procedure: Anthoney Harada;  Surgeon: [name], DPM;  Location: [facility];  Service: Podiatry;  Laterality: Left;   CESAREAN SECTION     x 2   CHOLECYSTECTOMY N/A 02/15/2019   Procedure: LAPAROSCOPIC CHOLECYSTECTOMY WITH INTRAOPERATIVE CHOLANGIOGRAM;  Surgeon: Andria Meuse, MD;  Location: [facility];  Service: General;  Laterality: N/A;   COLON RESECTION N/A 05/04/2020   Procedure: DIAGNOSTIC LAPAROSCOPY, RESECTION OF CANDYCANE, UPPER ENDOSCOPY;  Surgeon: Berna Bue, MD;  Location: [facility];   Service: General;  Laterality: N/A;   FOOT SURGERY     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   Procedure: LAPAROSCOPIC ROUX-EN-Y GASTRIC;  Surgeon: Atilano Ina, MD;  Location: [facility];  Service: General;  Laterality: N/A;  laparoscopic roux-en-y gastric bypass   LITHOTRIPSY Left    TONSILLECTOMY     TUBAL LIGATION     UPPER GI ENDOSCOPY  08/24/2012   Procedure: UPPER GI ENDOSCOPY;  Surgeon: Atilano Ina, MD;  Location: [facility];  Service: General;;   WISDOM TOOTH EXTRACTION       OB History     Gravida  2   Para  2   Term  2   Preterm  0   AB  0   Living  2      SAB  0   IAB  0   Ectopic  0   Multiple  0   Live Births  2           Family History  Problem Relation Age of Onset   Hyperlipidemia Father    Hypertension Father    Kidney disease Father    Colon cancer Father 68   Cancer Father    Depression Father    Anxiety disorder Father    Bipolar disorder Father    Sleep apnea Father    Obesity Father    Hypertension Mother    Depression Mother    Anxiety disorder Mother    Obesity Mother    Hypertension Maternal Grandmother    Uterine cancer Maternal Grandmother 9   Diabetes Maternal Grandfather     Social History   Tobacco Use   Smoking status: Never   Smokeless tobacco: Never  Vaping Use   Vaping Use: Never used  Substance Use Topics   Alcohol use: Not Currently    Comment: rare   Drug use: No    Home Medications Prior to Admission medications   Medication Sig Start Date End Date Taking? Authorizing Provider  acetaminophen (TYLENOL) 500 MG tablet Take 2 tablets (1,000 mg total) by mouth every 6 (six) hours as needed for moderate pain. 02/13/19   Fayrene Helper, PA-C  albuterol (VENTOLIN HFA) 108 (90 Base) MCG/ACT inhaler Inhale 2 puffs into the lungs every 4 (four) hours as needed for wheezing or shortness of breath.  02/21/20   [provider]  amoxicillin-clavulanate (AUGMENTIN) 875-125 MG tablet Take 1 tablet by mouth 2 (two) times  daily. 04/02/21   Daphine Deutscher Mary-Margaret, FNP  baclofen (LIORESAL) 10 MG tablet TAKE 1 TABLET BY MOUTH THREE TIMES A DAY AS NEEDED FOR MUSCLE SPASMS 03/27/21   Rodolph Bong, MD  busPIRone (BUSPAR) 15 MG tablet Take 15 mg by mouth 3 (three) times daily. 11/01/19   [provider]  CALCIUM PO Take 1 tablet by mouth daily.    [provider]  chlorpheniramine-HYDROcodone (TUSSIONEX) 10-8 MG/5ML SUER  Take 5 mLs by mouth nightly as needed for up to 15 doses. 03/21/21     clonazePAM (KLONOPIN) 1 MG tablet Take 0.5 tablets (0.5 mg total) by mouth 2 (two) times daily as needed for anxiety. 03/27/21   Willow Ora, MD  clotrimazole (MYCELEX) 10 MG troche Take 10 mg by mouth 5 (five) times daily. 09/24/20   [provider]  cyanocobalamin (,VITAMIN B-12,) 1000 MCG/ML injection Inject 1 vial per week for 3 weeks, then 1 vial per month thereafter 06/21/20   Willow Ora, MD  cyclobenzaprine (FLEXERIL) 10 MG tablet Take 1 tablet (10 mg total) by mouth 3 (three) times daily as needed for muscle spasms. 01/31/21   Kirsteins, Victorino Sparrow, MD  diclofenac Sodium (VOLTAREN) 1 % GEL Apply 4 g topically 4 (four) times daily. Patient taking differently: Apply 4 g topically 4 (four) times daily as needed (pain). 05/13/19   Palumbo, April, MD  docusate sodium (COLACE) 100 MG capsule Take 1 capsule (100 mg total) by mouth 2 (two) times daily. 05/06/20   Emelia Loron, MD  hydrOXYzine (VISTARIL) 50 MG capsule Take 50 mg by mouth every 6 (six) hours as needed. 12/26/20   [provider]  lamoTRIgine (LAMICTAL) 150 MG tablet Take 300 mg by mouth daily.  04/10/20   [provider]  lithium carbonate (LITHOBID) 300 MG CR tablet Take 600 mg by mouth at bedtime.    [provider]  Magnesium 500 MG TABS Take 1 tablet by mouth daily.    [provider]  metFORMIN (GLUCOPHAGE) 500 MG tablet Take 500 mg by mouth 2 (two) times daily. 11/01/19   [provider]   nortriptyline (PAMELOR) 10 MG capsule TAKE 1 CAPSULE BY MOUTH AT BEDTIME. 04/04/21   Kirsteins, Victorino Sparrow, MD  ondansetron (ZOFRAN) 4 MG tablet Take 1 tablet (4 mg total) by mouth every 6 (six) hours. 09/22/20   Jacalyn Lefevre, MD  pantoprazole (PROTONIX) 20 MG tablet Take 1 tablet (20 mg total) by mouth daily. 12/14/20   Willow Ora, MD  predniSONE (DELTASONE) 20 MG tablet Take 2 tablets (40 mg total) in the mornings together with breakfast daily for 5 days 03/21/21     pregabalin (LYRICA) 200 MG capsule Take 1 capsule (200 mg total) by mouth 2 (two) times daily. 11/27/20   Kirsteins, Victorino Sparrow, MD  promethazine-dextromethorphan (PROMETHAZINE-DM) 6.25-15 MG/5ML syrup Take 5 mLs by mouth 4 (four) times daily as needed for cough. 04/02/21   Daphine Deutscher, Mary-Margaret, FNP  QUEtiapine (SEROQUEL) 400 MG tablet Take 400 mg by mouth at bedtime.  08/24/19   [provider]  QUEtiapine (SEROQUEL) 50 MG tablet Take by mouth. 06/27/20   [provider]  SYRINGE-NEEDLE, DISP, 3 ML 25G X 1" 3 ML MISC Use 1 needle per application once weekly 06/21/20   Willow Ora, MD  tizanidine (ZANAFLEX) 2 MG capsule Take 2 capsules (4 mg total) by mouth 3 (three) times daily as needed for muscle spasms. 11/15/20   Rodolph Bong, MD  valACYclovir (VALTREX) 1000 MG tablet Take 1 tablet (1,000 mg total) by mouth 3 (three) times daily. Patient taking differently: Take 1,000 mg by mouth 3 (three) times daily as needed (cold sores). 05/25/19   Daphine Deutscher Mary-Margaret, FNP    Allergies    Nsaids and Zolpidem  Review of Systems   Review of Systems  HENT:  Positive for ear pain.   Gastrointestinal:  Positive for abdominal pain.  All other systems reviewed and are  negative.  Physical Exam Updated Vital Signs BP (!) 143/76 (BP Location: Right Arm)   Pulse 95   Temp 98.2 F (36.8 C) (Oral)   Resp 20   Ht 5\' 4"  (1.626 m)   Wt 122.5 kg   LMP 04/02/2021 (Exact Date)   SpO2 100%   BMI 46.35 kg/m   Physical  Exam Vitals and nursing note reviewed.  Constitutional:      General: She is not in acute distress.    Appearance: Normal appearance.  HENT:     Head: Normocephalic and atraumatic.     Ears:     Comments: Bilateral external auditory canals were normal without any evidence of erythema or edema.  Left TM was without erythema and was not bulging.  There was trace amount of effusion in the left ear.  Right TM was normal. Eyes:     General:        Right eye: No discharge.        Left eye: No discharge.  Cardiovascular:     Comments: Regular rate and rhythm.  S1/S2 are distinct without any evidence of murmur, rubs, or gallops.  Radial pulses are 2+ bilaterally.  Dorsalis pedis pulses are 2+ bilaterally.  No evidence of pedal edema. Pulmonary:     Comments: Clear to auscultation bilaterally.  Normal effort.  No respiratory distress.  No evidence of wheezes, rales, or rhonchi heard throughout. Abdominal:     Tenderness: There is no guarding or rebound.     Comments: Obese abdomen.  There is moderate tenderness in the epigastrium, left upper quadrant, and right lower quadrant.  There are well-healed surgical scars along the abdomen.  Bowel sounds were normal.  No obvious peritoneal signs.  Right upper quadrant was nontender.  Musculoskeletal:        General: Normal range of motion.     Cervical back: Neck supple.  Skin:    General: Skin is warm and dry.     Findings: No rash.  Neurological:     General: No focal deficit present.     Mental Status: She is alert.  Psychiatric:        Mood and Affect: Mood normal.        Behavior: Behavior normal.    ED Results / Procedures / Treatments   Labs (all labs ordered are listed, but only abnormal results are displayed) Labs Reviewed  CBC WITH DIFFERENTIAL/PLATELET - Abnormal; Notable for the following components:      Result Value   WBC 11.5 (*)    Neutro Abs 8.5 (*)    All other components within normal limits  COMPREHENSIVE METABOLIC PANEL  - Abnormal; Notable for the following components:   AST 11 (*)    Alkaline Phosphatase 127 (*)    Total Bilirubin 0.2 (*)    All other components within normal limits  URINALYSIS, ROUTINE W REFLEX MICROSCOPIC - Abnormal; Notable for the following components:   Specific Gravity, Urine 1.035 (*)    Hgb urine dipstick SMALL (*)    Protein, ur 30 (*)    All other components within normal limits  RESP PANEL BY RT-PCR (FLU A&B, COVID) ARPGX2  LIPASE, BLOOD  PREGNANCY, URINE    EKG None  Radiology CT ABDOMEN PELVIS W CONTRAST  Result Date: 04/05/2021 CLINICAL DATA:  Epigastric pain EXAM: CT ABDOMEN AND PELVIS WITH CONTRAST TECHNIQUE: Multidetector CT imaging of the abdomen and pelvis was performed using the standard protocol following bolus administration of intravenous contrast. CONTRAST:  OMNIPAQUE IOHEXOL 300 MG/ML  SOLN COMPARISON:  CT 02/05/2021, 09/22/2020, 05/25/2020 FINDINGS: Lower chest: Lung bases demonstrate no acute consolidation or effusion. Normal cardiac size. Hepatobiliary: No focal liver abnormality is seen. Status post cholecystectomy. No biliary dilatation. Pancreas: Unremarkable. No pancreatic ductal dilatation or surrounding inflammatory changes. Spleen: Normal in size without focal abnormality. Adrenals/Urinary Tract: Adrenal glands are normal. Prominent renal pelvises without hydronephrosis or hydroureter. The bladder is unremarkable. Stomach/Bowel: Status post gastric bypass. No obstructive changes. No acute bowel wall thickening. Negative appendix. Vascular/Lymphatic: No significant vascular findings are present. No enlarged abdominal or pelvic lymph nodes. Reproductive: Uterus and bilateral adnexa are unremarkable. Other: Negative for free air or free fluid. Small fat containing umbilical and periumbilical hernias. Musculoskeletal: No acute or significant osseous findings. IMPRESSION: 1. No CT evidence for acute intra-abdominopelvic abnormality. 2. Status post gastric  bypass.  No evidence for obstruction. Electronically Signed   By: Jasmine Pang M.D.   On: 04/05/2021 19:40    Procedures Procedures   Medications Ordered in ED Medications  ondansetron (ZOFRAN) injection 4 mg (4 mg Intravenous Given 04/05/21 1731)  sodium chloride 0.9 % bolus 500 mL (0 mLs Intravenous Stopped 04/05/21 1803)  iohexol (OMNIPAQUE) 300 MG/ML solution 100 mL (100 mLs Intravenous Contrast Given 04/05/21 1919)  chlorpheniramine-HYDROcodone (TUSSIONEX) 10-8 MG/5ML suspension 5 mL (5 mLs Oral Given 04/05/21 2004)  ondansetron (ZOFRAN) injection 4 mg (4 mg Intravenous Given 04/05/21 2004)    ED Course  I have reviewed the triage vital signs and the nursing notes.  Pertinent labs & imaging results that were available during my care of the patient were reviewed by me and considered in my medical decision making (see chart for details).    MDM Rules/Calculators/A&P                          YENIFER SACCENTE is a 35 y.o. female who presents emerged department for evaluation of intractable nausea and vomiting.  History and physical exam is less concerning for SBO, pancreatitis, gastric or esophageal dysmotility. Appendicitis is less likely.  CBC and CMP are unremarkable apart from mild leukocytosis.  No evidence of AKI or electrolyte abnormalities.  Lipase is negative.  COVID and influenza are negative.  Urinalysis is without any signs of overt infection.  CT abdomen was negative.  Given the clinical scenario, believe she is safe to go home.  Her nausea and vomiting was controlled with 8 mg of Zofran IV.  I have also given her a dose of Tussionex in the department today for cough.  I will also give her a short course of Tussionex to go home with.  Strict return precautions were given.  I will have her follow-up with her primary care doctor within next 1 to 2 weeks.  Final Clinical Impression(s) / ED Diagnoses Final diagnoses:  Nausea and vomiting, unspecified vomiting type    Rx  / DC Orders ED Discharge Orders     None        Jolyn Lent 04/05/21 2016    Benjiman Core, MD 04/05/21 2311

## 2021-04-05 NOTE — ED Triage Notes (Signed)
Pt reports sharp pain with eating, that dulls after.  Pt has tried zofran and promethezine without relief.

## 2021-04-05 NOTE — ED Notes (Signed)
Pt verbalizes understanding of discharge instructions. Opportunity for questioning and answers were provided. Armand removed by staff, pt discharged from ED to home. Educated to pick up Rx for cough.

## 2021-04-12 DIAGNOSIS — D225 Melanocytic nevi of trunk: Secondary | ICD-10-CM | POA: Diagnosis not present

## 2021-04-12 DIAGNOSIS — D485 Neoplasm of uncertain behavior of skin: Secondary | ICD-10-CM | POA: Diagnosis not present

## 2021-04-12 DIAGNOSIS — Z1283 Encounter for screening for malignant neoplasm of skin: Secondary | ICD-10-CM | POA: Diagnosis not present

## 2021-04-25 ENCOUNTER — Ambulatory Visit: Payer: BC Managed Care – PPO

## 2021-04-29 DIAGNOSIS — D485 Neoplasm of uncertain behavior of skin: Secondary | ICD-10-CM | POA: Diagnosis not present

## 2021-04-29 DIAGNOSIS — L988 Other specified disorders of the skin and subcutaneous tissue: Secondary | ICD-10-CM | POA: Diagnosis not present

## 2021-05-02 DIAGNOSIS — Z9884 Bariatric surgery status: Secondary | ICD-10-CM | POA: Diagnosis not present

## 2021-05-02 DIAGNOSIS — R112 Nausea with vomiting, unspecified: Secondary | ICD-10-CM | POA: Diagnosis not present

## 2021-05-02 DIAGNOSIS — R1013 Epigastric pain: Secondary | ICD-10-CM | POA: Diagnosis not present

## 2021-05-06 ENCOUNTER — Other Ambulatory Visit: Payer: Self-pay

## 2021-05-06 ENCOUNTER — Telehealth: Payer: Self-pay

## 2021-05-06 DIAGNOSIS — R1013 Epigastric pain: Secondary | ICD-10-CM

## 2021-05-06 NOTE — Telephone Encounter (Signed)
EGD has been scheduled for 07/04/21 at 1030 am at [facility] with DJ.  Left message on machine to call back

## 2021-05-06 NOTE — Telephone Encounter (Signed)
-----   Message from Rachael Fee, MD sent at 05/03/2021  6:20 AM EST ----- Got it, thanks.  We'll contact her about EGD  Jonthan Leite, She needs EGD.  Probably safest to do at hospital, her last BMI that I can see is 47.  This is for epig pains.  Thanks   ----- Message ----- From: Gaynelle Adu, MD Sent: 05/02/2021   3:56 PM EST To: Rachael Fee, MD  Jesusita Oka, This is a gastric bypass of mine from 2014 that presented to the office complaining of difficulty and intolerance to solid foods.  Having a fair amount of epigastric pain with solids and thick liquids.  Tolerating thin liquids.  Has been to the ER.  This has been an ongoing issue for 2 months she states.  No NSAID use.  Her symptoms are suggestive of a marginal ulcer.  Started her on crushed Protonix and Carafate today.  But I think she needs an endoscopy.  You last scoped her in 2020.  Also considering getting an upper GI on her as well.  She has had cholecystectomy in the past and resection of her candycane limb   Thanks Minerva Areola

## 2021-05-07 ENCOUNTER — Encounter
Payer: BC Managed Care – PPO | Attending: Physical Medicine & Rehabilitation | Admitting: Physical Medicine & Rehabilitation

## 2021-05-07 ENCOUNTER — Other Ambulatory Visit: Payer: Self-pay

## 2021-05-07 ENCOUNTER — Ambulatory Visit (HOSPITAL_BASED_OUTPATIENT_CLINIC_OR_DEPARTMENT_OTHER)
Admission: RE | Admit: 2021-05-07 | Discharge: 2021-05-07 | Disposition: A | Discharge status: Home / Self Care | Payer: BC Managed Care – PPO | Source: Ambulatory Visit | Attending: Physical Medicine & Rehabilitation | Admitting: Physical Medicine & Rehabilitation

## 2021-05-07 ENCOUNTER — Other Ambulatory Visit (HOSPITAL_BASED_OUTPATIENT_CLINIC_OR_DEPARTMENT_OTHER): Payer: Self-pay

## 2021-05-07 ENCOUNTER — Encounter: Payer: Self-pay | Admitting: Physical Medicine & Rehabilitation

## 2021-05-07 VITALS — BP 116/84 | HR 89 | Temp 98.1°F | Ht 64.0 in | Wt 267.0 lb

## 2021-05-07 DIAGNOSIS — M25561 Pain in right knee: Secondary | ICD-10-CM | POA: Insufficient documentation

## 2021-05-07 IMAGING — DX DG KNEE 1-2V*R*
2 series · 2 of 2 positions shown · non-contrast
Comparison: None.

CLINICAL DATA: Right knee pain.

EXAM:
RIGHT KNEE - 1-2 VIEW

[knee ap]
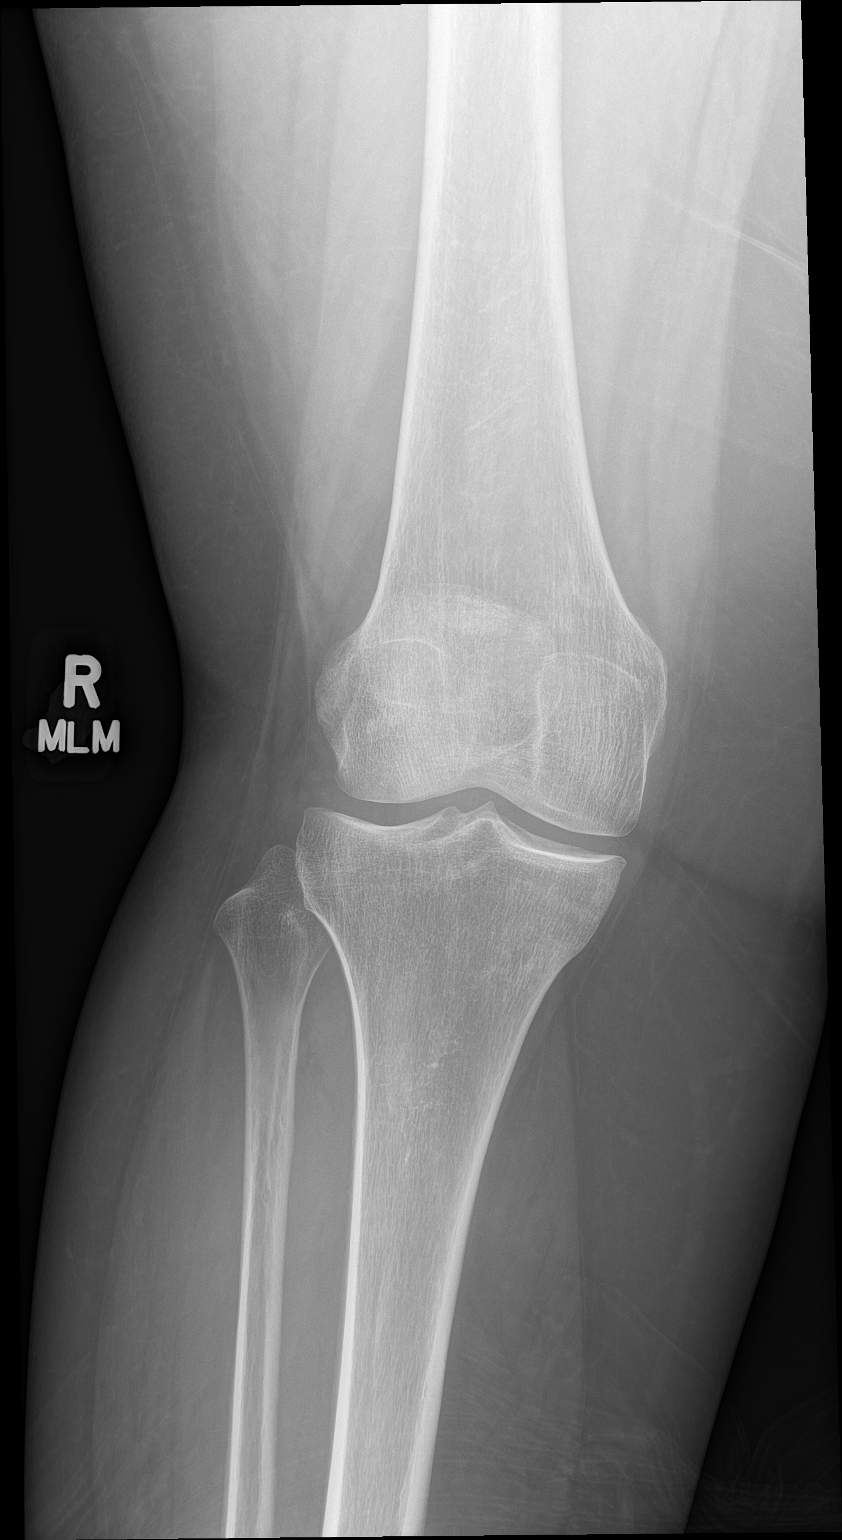

[knee lat]
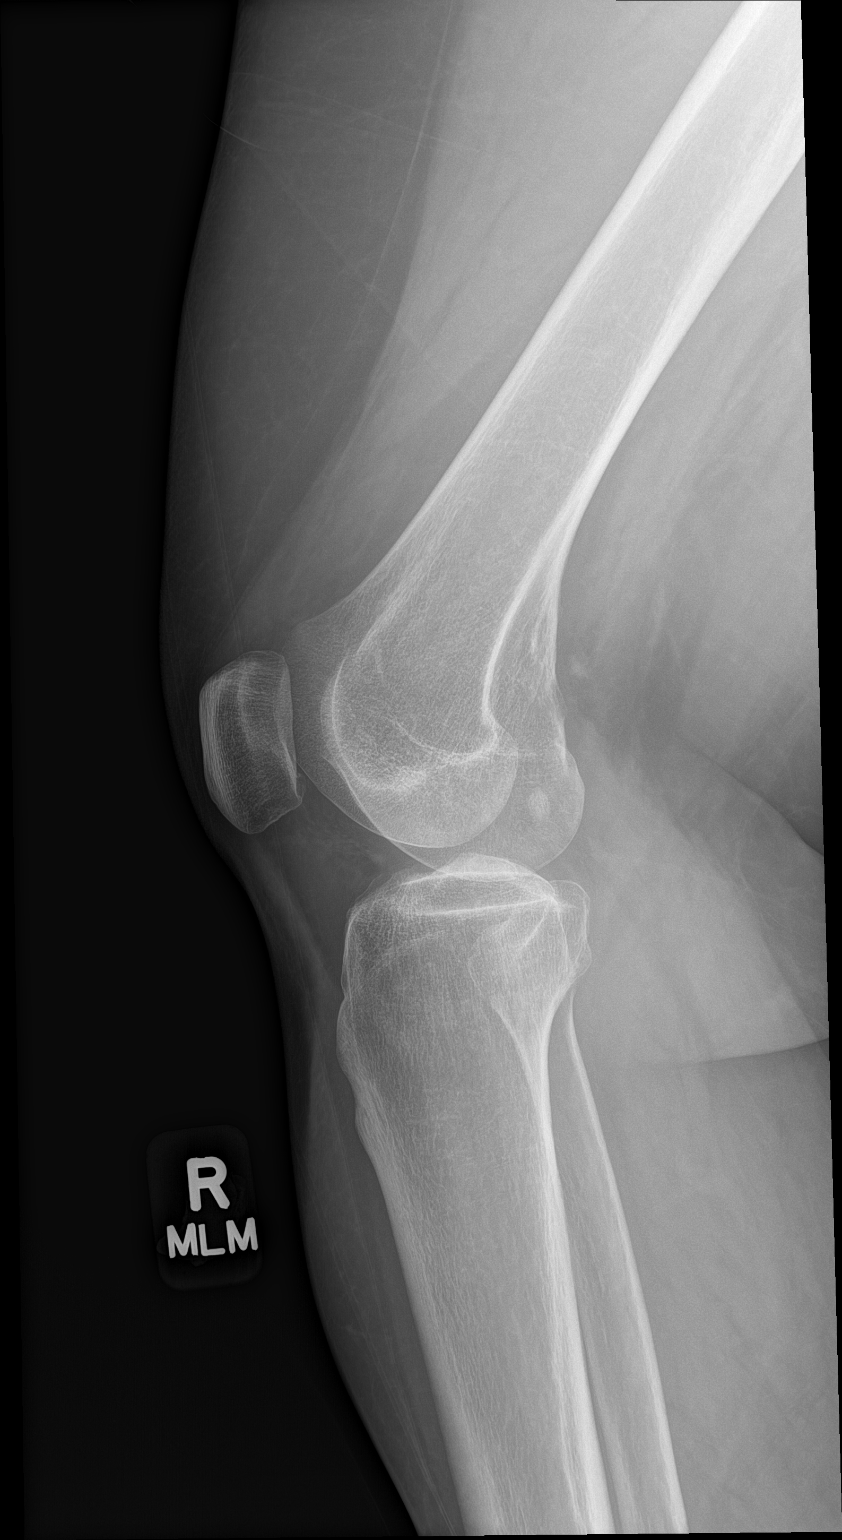

[2 of 2 positions shown; findings below may reference images not displayed]

FINDINGS: There is no acute fracture or dislocation. No significant arthritic
changes. No joint effusion. The soft tissues are unremarkable.
IMPRESSION: Negative.

## 2021-05-07 MED ORDER — TRAMADOL HCL 50 MG PO TABS
50.0000 mg | ORAL_TABLET | Freq: Two times a day (BID) | ORAL | 0 refills | Status: DC | PRN
Start: 2021-05-07 — End: 2021-06-19
  Filled 2021-05-07: qty 30, 15d supply, fill #0

## 2021-05-07 NOTE — Telephone Encounter (Signed)
EGD scheduled, pt instructed and medications reviewed.  Patient instructions mailed to home.  Patient to call with any questions or concerns.  
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
## 2021-05-16 ENCOUNTER — Telehealth: Payer: Self-pay | Admitting: *Deleted

## 2021-05-16 NOTE — Telephone Encounter (Addendum)
-----   Message from Erick Colace, MD sent at 05/10/2021  5:45 AM EST ----- No sig knee arthritis, pain likely due to meniscal issues, please inform pt [name] [name] notified.

## 2021-05-24 ENCOUNTER — Other Ambulatory Visit: Payer: Self-pay

## 2021-05-24 ENCOUNTER — Encounter (HOSPITAL_BASED_OUTPATIENT_CLINIC_OR_DEPARTMENT_OTHER): Payer: Self-pay | Admitting: Emergency Medicine

## 2021-05-24 ENCOUNTER — Emergency Department (HOSPITAL_BASED_OUTPATIENT_CLINIC_OR_DEPARTMENT_OTHER)
Admission: EM | Admit: 2021-05-24 | Discharge: 2021-05-25 | Disposition: A | Discharge status: Home / Self Care | Payer: BC Managed Care – PPO | Attending: Emergency Medicine | Admitting: Emergency Medicine

## 2021-05-24 DIAGNOSIS — R1013 Epigastric pain: Secondary | ICD-10-CM | POA: Insufficient documentation

## 2021-05-24 DIAGNOSIS — R112 Nausea with vomiting, unspecified: Secondary | ICD-10-CM | POA: Insufficient documentation

## 2021-05-24 DIAGNOSIS — Z7984 Long term (current) use of oral hypoglycemic drugs: Secondary | ICD-10-CM | POA: Insufficient documentation

## 2021-05-24 DIAGNOSIS — R109 Unspecified abdominal pain: Secondary | ICD-10-CM | POA: Diagnosis not present

## 2021-05-24 DIAGNOSIS — Z9049 Acquired absence of other specified parts of digestive tract: Secondary | ICD-10-CM | POA: Insufficient documentation

## 2021-05-24 DIAGNOSIS — Z79899 Other long term (current) drug therapy: Secondary | ICD-10-CM | POA: Diagnosis not present

## 2021-05-24 DIAGNOSIS — I10 Essential (primary) hypertension: Secondary | ICD-10-CM | POA: Insufficient documentation

## 2021-05-24 DIAGNOSIS — K219 Gastro-esophageal reflux disease without esophagitis: Secondary | ICD-10-CM | POA: Diagnosis not present

## 2021-05-24 DIAGNOSIS — E039 Hypothyroidism, unspecified: Secondary | ICD-10-CM | POA: Diagnosis not present

## 2021-05-24 LAB — URINALYSIS, MICROSCOPIC (REFLEX)

## 2021-05-24 LAB — URINALYSIS, ROUTINE W REFLEX MICROSCOPIC
Bilirubin Urine: NEGATIVE
Glucose, UA: NEGATIVE mg/dL
Ketones, ur: NEGATIVE mg/dL
Nitrite: NEGATIVE
Protein, ur: NEGATIVE mg/dL
Specific Gravity, Urine: >1.03 — ABNORMAL HIGH (ref 1.005–1.030)
pH: 6 (ref 5.0–8.0)

## 2021-05-24 LAB — CBC
HCT: 40 % (ref 36.0–46.0)
Hemoglobin: 13.1 g/dL (ref 12.0–15.0)
MCH: 27.6 pg (ref 26.0–34.0)
MCHC: 32.8 g/dL (ref 30.0–36.0)
MCV: 84.4 fL (ref 80.0–100.0)
Platelets: 386 10*3/uL (ref 150–400)
RBC: 4.74 MIL/uL (ref 3.87–5.11)
RDW: 15 % (ref 11.5–15.5)
WBC: 10.6 10*3/uL — ABNORMAL HIGH (ref 4.0–10.5)
nRBC: 0 % (ref 0.0–0.2)

## 2021-05-24 LAB — COMPREHENSIVE METABOLIC PANEL
ALT: 7 U/L (ref 0–44)
AST: 12 U/L — ABNORMAL LOW (ref 15–41)
Albumin: 4.4 g/dL (ref 3.5–5.0)
Alkaline Phosphatase: 115 U/L (ref 38–126)
Anion gap: 10 (ref 5–15)
BUN: 12 mg/dL (ref 6–20)
CO2: 25 mmol/L (ref 22–32)
Calcium: 9.6 mg/dL (ref 8.9–10.3)
Chloride: 106 mmol/L (ref 98–111)
Creatinine, Ser: 0.71 mg/dL (ref 0.44–1.00)
GFR, Estimated: >60 mL/min (ref >60)
Glucose, Bld: 82 mg/dL (ref 70–99)
Potassium: 3.6 mmol/L (ref 3.5–5.1)
Sodium: 141 mmol/L (ref 135–145)
Total Bilirubin: 0.4 mg/dL (ref 0.3–1.2)
Total Protein: 7.1 g/dL (ref 6.5–8.1)

## 2021-05-24 LAB — PREGNANCY, URINE: Preg Test, Ur: NEGATIVE

## 2021-05-24 LAB — LIPASE, BLOOD: Lipase: 24 U/L (ref 11–51)

## 2021-05-24 MED ORDER — MORPHINE SULFATE (PF) 4 MG/ML IV SOLN
4.0000 mg | Freq: Once | INTRAVENOUS | Status: AC
  Administered 2021-05-24: 4 mg via INTRAVENOUS
  Filled 2021-05-24: qty 1

## 2021-05-24 MED ORDER — PANTOPRAZOLE SODIUM 40 MG IV SOLR
40.0000 mg | Freq: Once | INTRAVENOUS | Status: AC
  Administered 2021-05-24: 40 mg via INTRAVENOUS
  Filled 2021-05-24: qty 40

## 2021-05-24 MED ORDER — SODIUM CHLORIDE 0.9 % IV BOLUS
1000.0000 mL | Freq: Once | INTRAVENOUS | Status: AC
  Administered 2021-05-24: 1000 mL via INTRAVENOUS

## 2021-05-24 MED ORDER — ONDANSETRON HCL 4 MG/2ML IJ SOLN
4.0000 mg | Freq: Once | INTRAMUSCULAR | Status: AC
  Administered 2021-05-24: 4 mg via INTRAVENOUS
  Filled 2021-05-24: qty 2

## 2021-05-24 NOTE — ED Triage Notes (Signed)
Pt arrives pov with c/o N/V/D x 1 month, possible ulcer. Reports acid reflux and epigastric pain. Reports black, tarry stool yesterday

## 2021-05-24 NOTE — ED Provider Notes (Signed)
[facility] EMERGENCY DEPT Provider Note   CSN: [id number] Arrival date & time: 05/24/21  1826     History Chief Complaint  Patient presents with   Abdominal Pain    [name] [name] is a 35 y.o. female.  Patient with history of gastric bypass, hiatal hernia, cholecystectomy, hypertension presenting with upper abdominal pain with nausea and diarrhea.  States she has been having symptoms ever since September with upper abdominal pain and frequent vomiting.  She is vomiting 2-3 times daily that is mostly food particles and nonbloody.  She saw her surgeon in November and was thought to have a marginal ulcer and is being scheduled for endoscopy in January but also was not confirmed.  She is currently taking Carafate and Protonix as well as Zofran without relief.  Comes in today with progressively worsening upper abdominal pain in her epigastric course of the past 3 days.  States she has vomiting every time she tries to eat 4-5 times daily that is nonbloody and nonbilious.  She has frequent loose stools that are brown.  2 days ago they were tarry and black however which was 1 time and has not recurred.  Denies any blood thinner use.  Denies any chest pain or shortness of breath.  Denies any fever.  Denies any NSAID use or alcohol use.  Symptoms are similar to when she saw Dr. Andrey Campanile in November but worsening.  She is scheduled for endoscopy in January.  Reports last endoscopy in 2018 showed a hiatal hernia but no ulcers.  The history is provided by the patient.  Abdominal Pain Associated symptoms: nausea and vomiting   Associated symptoms: no cough, no dysuria, no fever, no hematuria and no shortness of breath       Past Medical History:  Diagnosis Date   ADD (attention deficit disorder)    Anemia    Anxiety    B12 deficiency    Back pain    Bilateral swelling of feet    Bipolar 2 disorder (HCC) 09/19/2019   Constipation    DEPRESSION    Elevated cholesterol    Family  history of colon cancer May 22, 2019   Father, 28s; deceased.    Fatty liver    Fibromyalgia    Gallbladder problem    GERD (gastroesophageal reflux disease)    Heartburn    History of stomach ulcers    Hypertension    Hypothyroidism    Hx of, normalized TSH during pregnancy 2011   Infertility, female    Lactose intolerance    Migraines    Morbid obesity (HCC)    s/p RY 08/2012 - start weight 290 pounds   Nephrolithiasis    Palpitations    Panic disorder    PCOS (polycystic ovarian syndrome)    PONV (postoperative nausea and vomiting)    Pregnancy induced hypertension    Shortness of breath    Vitamin D deficiency     Patient Active Problem List   Diagnosis Date Noted   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   Plantar fasciitis 10/31/2019   Bipolar 2 disorder (HCC) 09/19/2019   Fibromyalgia 09/09/2019   Ischial bursitis of left side 07/19/2019   Vitamin B12 deficiency 06/01/2019   Vitamin D deficiency 06/01/2019   Panic disorder 06/01/2019   Family history of colon cancer 05-22-19   Chronic constipation 2019/05/22   Major depression, recurrent, chronic (HCC) 2019/05/22   Insomnia due to other mental disorder 03/13/2019   Pain in both lower extremities 11/21/2018  Nocturnal leg cramps 10/26/2018   Nephrolithiasis    Reduced libido, due to Lexapro 07/09/2017   Binge eating disorder 04/23/2017   Acquired iron deficiency anemia due to decreased absorption 02/22/2015   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    Past Surgical History:  Procedure Laterality Date   Barbie Banner OSTEOTOMY Left 06/24/2017   Procedure: Barbie Banner OSTEOTOMY;  Surgeon: Trula Slade, DPM;  Location: [place];  Service: Podiatry;  Laterality: Left;   BUNIONECTOMY Left 06/24/2017   Procedure: Annye English;  Surgeon: Trula Slade, DPM;  Location: [place];  Service: Podiatry;  Laterality: Left;   CESAREAN SECTION     x 2    CHOLECYSTECTOMY N/A 02/15/2019   Procedure: LAPAROSCOPIC CHOLECYSTECTOMY WITH INTRAOPERATIVE CHOLANGIOGRAM;  Surgeon: Ileana Roup, MD;  Location[facility];  Service: General;  Laterality: N/A;   COLON RESECTION N/A 05/04/2020   Procedure: DIAGNOSTIC LAPAROSCOPY, RESECTION OF CANDYCANE, UPPER ENDOSCOPY;  Surgeon: Clovis Riley, MD;  [facility];  Service: General;  Laterality: N/A;   FOOT SURGERY     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   Procedure: LAPAROSCOPIC ROUX-EN-Y GASTRIC;  Surgeon: Gayland Curry, MD;  [facility];  Service: General;  Laterality: N/A;  laparoscopic roux-en-y gastric bypass   LITHOTRIPSY Left    TONSILLECTOMY     TUBAL LIGATION     UPPER GI ENDOSCOPY  08/24/2012   Procedure: UPPER GI ENDOSCOPY;  Surgeon: Gayland Curry, MD;  [facility];  Service: General;;   WISDOM TOOTH EXTRACTION       OB History     Gravida  2   Para  2   Term  2   Preterm  0   AB  0   Living  2      SAB  0   IAB  0   Ectopic  0   Multiple  0   Live Births  2           Family History  Problem Relation Age of Onset   Hyperlipidemia Father    Hypertension Father    Kidney disease Father    Colon cancer Father 55   Cancer Father    Depression Father    Anxiety disorder Father    Bipolar disorder Father    Sleep apnea Father    Obesity Father    Hypertension Mother    Depression Mother    Anxiety disorder Mother    Obesity Mother    Hypertension Maternal Grandmother    Uterine cancer Maternal Grandmother 76   Diabetes Maternal Grandfather     Social History   Tobacco Use   Smoking status: Never   Smokeless tobacco: Never  Vaping Use   Vaping Use: Never used  Substance Use Topics   Alcohol use: Not Currently    Comment: rare   Drug use: No    Home Medications Prior to Admission medications   Medication Sig Start Date End Date Taking? Authorizing Provider  acetaminophen (TYLENOL) 500 MG tablet Take 2 tablets (1,000 mg total) by  mouth every 6 (six) hours as needed for moderate pain. 02/13/19   Domenic Moras, PA-C  baclofen (LIORESAL) 10 MG tablet TAKE 1 TABLET BY MOUTH THREE TIMES A DAY AS NEEDED FOR MUSCLE SPASMS 03/27/21   Gregor Hams, MD  busPIRone (BUSPAR) 15 MG tablet Take 15 mg by mouth 3 (three) times daily. 11/01/19   [provider]  CALCIUM PO Take 1  tablet by mouth daily.    [provider]  clonazePAM (KLONOPIN) 1 MG tablet Take 0.5 tablets (0.5 mg total) by mouth 2 (two) times daily as needed for anxiety. 03/27/21   Leamon Arnt, MD  clotrimazole (MYCELEX) 10 MG troche Take 10 mg by mouth 5 (five) times daily. 09/24/20   [provider]  cyanocobalamin (,VITAMIN B-12,) 1000 MCG/ML injection Inject 1 vial per week for 3 weeks, then 1 vial per month thereafter 06/21/20   Leamon Arnt, MD  cyclobenzaprine (FLEXERIL) 10 MG tablet Take 1 tablet (10 mg total) by mouth 3 (three) times daily as needed for muscle spasms. 01/31/21   Kirsteins, Luanna Salk, MD  diclofenac Sodium (VOLTAREN) 1 % GEL Apply 4 g topically 4 (four) times daily. Patient taking differently: Apply 4 g topically 4 (four) times daily as needed (pain). 05/13/19   Palumbo, April, MD  docusate sodium (COLACE) 100 MG capsule Take 1 capsule (100 mg total) by mouth 2 (two) times daily. 05/06/20   Rolm Bookbinder, MD  hydrOXYzine (VISTARIL) 50 MG capsule Take 50 mg by mouth every 6 (six) hours as needed. 12/26/20   [provider]  lamoTRIgine (LAMICTAL) 150 MG tablet Take 300 mg by mouth daily.  04/10/20   [provider]  lithium carbonate (LITHOBID) 300 MG CR tablet Take 600 mg by mouth at bedtime.    [provider]  Magnesium 500 MG TABS Take 1 tablet by mouth daily.    [provider]  metFORMIN (GLUCOPHAGE) 500 MG tablet Take 500 mg by mouth 2 (two) times daily. 11/01/19   [provider]  nortriptyline (PAMELOR) 10 MG capsule TAKE 1 CAPSULE BY MOUTH AT BEDTIME. 04/04/21    Kirsteins, Luanna Salk, MD  ondansetron (ZOFRAN) 4 MG tablet Take 1 tablet (4 mg total) by mouth every 6 (six) hours. 09/22/20   Isla Pence, MD  pantoprazole (PROTONIX) 20 MG tablet Take 1 tablet (20 mg total) by mouth daily. 12/14/20   Leamon Arnt, MD  QUEtiapine (SEROQUEL) 400 MG tablet Take 400 mg by mouth at bedtime.  08/24/19   [provider]  QUEtiapine (SEROQUEL) 50 MG tablet Take by mouth. 06/27/20   [provider]  SYRINGE-NEEDLE, DISP, 3 ML 25G X 1" 3 ML MISC Use 1 needle per application once weekly 06/21/20   Leamon Arnt, MD  tizanidine (ZANAFLEX) 2 MG capsule Take 2 capsules (4 mg total) by mouth 3 (three) times daily as needed for muscle spasms. 11/15/20   Gregor Hams, MD  traMADol (ULTRAM) 50 MG tablet Take 1 tablet (50 mg total) by mouth every 12 (twelve) hours as needed. 05/07/21   Kirsteins, Luanna Salk, MD  valACYclovir (VALTREX) 1000 MG tablet Take 1 tablet (1,000 mg total) by mouth 3 (three) times daily. Patient taking differently: Take 1,000 mg by mouth 3 (three) times daily as needed (cold sores). 05/25/19   Hassell Done Mary-Margaret, FNP    Allergies    Nsaids, Other, and Zolpidem  Review of Systems   Review of Systems  Constitutional:  Positive for activity change and appetite change. Negative for fever.  HENT:  Negative for congestion, nosebleeds and sinus pressure.   Respiratory:  Negative for cough, chest tightness and shortness of breath.   Gastrointestinal:  Positive for abdominal pain, blood in stool, nausea and vomiting.  Genitourinary:  Negative for dysuria and hematuria.  Musculoskeletal:  Negative for arthralgias and myalgias.  Skin:  Negative for rash.  Neurological:  Negative for  dizziness, weakness and headaches.   all other systems are negative except as noted in the HPI and PMH.   Physical Exam Updated Vital Signs BP (!) 143/111 (BP Location: Right Wrist)   Pulse 97   Temp 98.2 F (36.8 C) (Oral)   Resp 20   Ht 5\' 4"  (1.626 m)    Wt 123.4 kg   SpO2 100%   BMI 46.69 kg/m   Physical Exam Vitals and nursing note reviewed.  Constitutional:      General: She is not in acute distress.    Appearance: She is well-developed. She is obese.  HENT:     Head: Normocephalic and atraumatic.     Mouth/Throat:     Pharynx: No oropharyngeal exudate.  Eyes:     Conjunctiva/sclera: Conjunctivae normal.     Pupils: Pupils are equal, round, and reactive to light.  Neck:     Comments: No meningismus. Cardiovascular:     Rate and Rhythm: Normal rate and regular rhythm.     Heart sounds: Normal heart sounds. No murmur heard. Pulmonary:     Effort: Pulmonary effort is normal. No respiratory distress.     Breath sounds: Normal breath sounds.  Abdominal:     Palpations: Abdomen is soft.     Tenderness: There is abdominal tenderness. There is no guarding or rebound.     Comments: Epigastric tenderness, no guarding or rebound, no lower abdominal tenderness  Genitourinary:    Comments: Chaperone present Journalist, newspaper.  No external hemorrhoids or fissures.  No gross blood on examining finger Musculoskeletal:        General: No tenderness. Normal range of motion.     Cervical back: Normal range of motion and neck supple.     Comments: No CVAT  Skin:    General: Skin is warm.  Neurological:     Mental Status: She is alert and oriented to person, place, and time.     Cranial Nerves: No cranial nerve deficit.     Motor: No abnormal muscle tone.     Coordination: Coordination normal.     Comments:  5/5 strength throughout. CN 2-12 intact.Equal grip strength.   Psychiatric:        Behavior: Behavior normal.    ED Results / Procedures / Treatments   Labs (all labs ordered are listed, but only abnormal results are displayed) Labs Reviewed  COMPREHENSIVE METABOLIC PANEL - Abnormal; Notable for the following components:      Result Value   AST 12 (*)    All other components within normal limits  CBC - Abnormal; Notable for the  following components:   WBC 10.6 (*)    All other components within normal limits  URINALYSIS, ROUTINE W REFLEX MICROSCOPIC - Abnormal; Notable for the following components:   Color, Urine STRAW (*)    APPearance HAZY (*)    Specific Gravity, Urine >1.030 (*)    Hgb urine dipstick LARGE (*)    Leukocytes,Ua TRACE (*)    All other components within normal limits  URINALYSIS, MICROSCOPIC (REFLEX) - Abnormal; Notable for the following components:   Bacteria, UA RARE (*)    All other components within normal limits  LIPASE, BLOOD  PREGNANCY, URINE  POC OCCULT BLOOD, ED    EKG None  Radiology CT ABDOMEN PELVIS W CONTRAST  Result Date: 05/25/2021 CLINICAL DATA:  Epigastric pain, status post gastric bypass, concern for ulcer EXAM: CT ABDOMEN AND PELVIS WITH CONTRAST TECHNIQUE: Multidetector CT imaging of the abdomen and  pelvis was performed using the standard protocol following bolus administration of intravenous contrast. CONTRAST:  OMNIPAQUE IOHEXOL 300 MG/ML  SOLN COMPARISON:  04/05/2021 FINDINGS: Lower chest: Lung bases are clear. Hepatobiliary: Liver is within normal limits. Status post cholecystectomy. No intrahepatic ductal dilatation. Common duct measures 10 mm. Pancreas: Within normal limits. Spleen: Within normal limits. Adrenals/Urinary Tract: Adrenal glands are within normal limits. Kidneys are notable for excretory contrast in the bilateral renal collecting systems. Bladder is within normal limits. Stomach/Bowel: Stomach is notable for a small gastric pouch with postsurgical changes related to gastric bypass. No evidence of bowel obstruction. Normal appendix (series 2/image 56). No colonic wall thickening or inflammatory changes. Vascular/Lymphatic: No evidence of abdominal aortic aneurysm. No suspicious abdominopelvic lymphadenopathy. Reproductive: Uterus is within normal limits. Bilateral ovaries are within normal limits. Other: No abdominopelvic ascites. Musculoskeletal:  Visualized osseous structures are within normal limits. IMPRESSION: Status post gastric bypass. No evidence of bowel obstruction. Normal appendix. Status post cholecystectomy. Common duct measures 10 mm, likely postsurgical. No CT findings to account for the patient's epigastric pain. Electronically Signed   By: Charline Bills M.D.   On: 05/25/2021 00:41    Procedures Procedures   Medications Ordered in ED Medications  sodium chloride 0.9 % bolus 1,000 mL (has no administration in time range)  ondansetron (ZOFRAN) injection 4 mg (has no administration in time range)  morphine 4 MG/ML injection 4 mg (has no administration in time range)  pantoprazole (PROTONIX) injection 40 mg (has no administration in time range)    ED Course  I have reviewed the triage vital signs and the nursing notes.  Pertinent labs & imaging results that were available during my care of the patient were reviewed by me and considered in my medical decision making (see chart for details).    MDM Rules/Calculators/A&P                          Nausea, vomiting, upper abdominal pain with history of gastric bypass and hiatal hernia.  Vital stable, no distress.  Abdomen soft without peritoneal signs.  Labs reassuring.  Normal LFTs and lipase.  Stable hemoglobin. No leukocytosis.  Patient given IV fluids, IV antiemetics and symptom control.  Hemoccult is negative.  CT imaging performed given her history of gastric bypass and recurrent symptoms of vomiting.  CT with no acute pathology.  She is tolerating p.o. No evidence of significant GI bleed.  Discussed with patient ulcer is still a concern but does not always show up on CT scan.  Continue her PPI, Carafate, antiemetics and follow-up with her surgeon as well as gastroenterologist  Tolerating p.o. in the ED.  Pain is improved.  No vomiting.  Discussed continuing her PPI, Carafate, antiemetics and following up with her PCP as well as gastroenterologist.  Return  to the ED with new or worsening symptoms Final Clinical Impression(s) / ED Diagnoses Final diagnoses:  Epigastric pain    Rx / DC Orders ED Discharge Orders     None        Aerial Dilley, Jeannett Senior, MD 05/25/21 [phone]

## 2021-05-25 ENCOUNTER — Emergency Department (HOSPITAL_BASED_OUTPATIENT_CLINIC_OR_DEPARTMENT_OTHER): Payer: BC Managed Care – PPO

## 2021-05-25 DIAGNOSIS — R1013 Epigastric pain: Secondary | ICD-10-CM | POA: Diagnosis not present

## 2021-05-25 LAB — OCCULT BLOOD X 1 CARD TO LAB, STOOL: Fecal Occult Bld: NEGATIVE

## 2021-05-25 IMAGING — CT CT ABD-PELV W/ CM
2 of 4 series · 16 of 46 positions shown, 18 images · IV contrast (APPLIED)
Comparison: [DATE]

CLINICAL DATA: Epigastric pain, status post gastric bypass, concern
for ulcer

EXAM:
CT ABDOMEN AND PELVIS WITH CONTRAST
TECHNIQUE: Multidetector CT imaging of the abdomen and pelvis was performed
using the standard protocol following bolus administration of
intravenous contrast.
CONTRAST:  100mL OMNIPAQUE IOHEXOL 300 MG/ML  SOLN

[Series 2: abd pel w · axial · 0.93mm/px · z∈[-293,+162]mm · 13 of 99 slices shown, 15 images]
[im 4/99  soft-tissue]
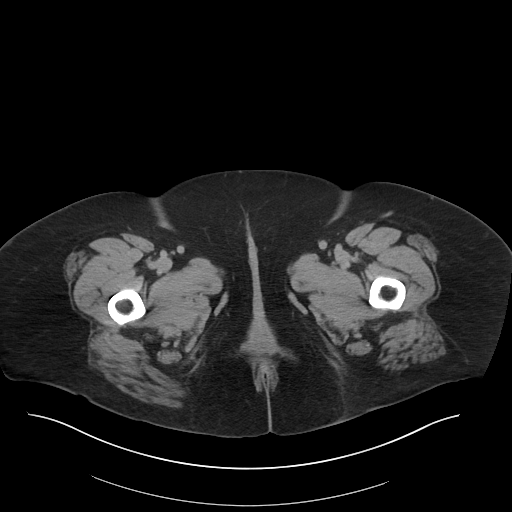
[im 4/99  bone]
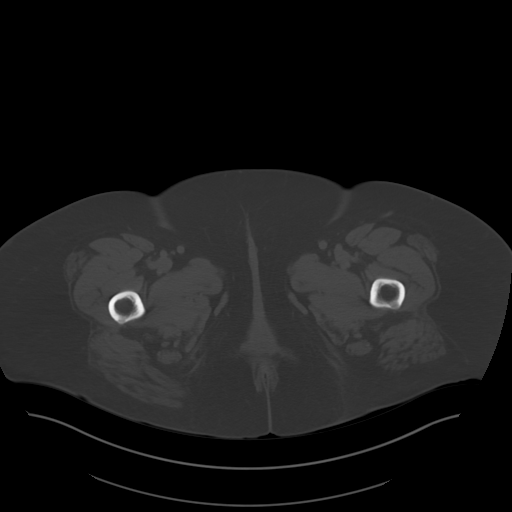
[im 12/99  soft-tissue]
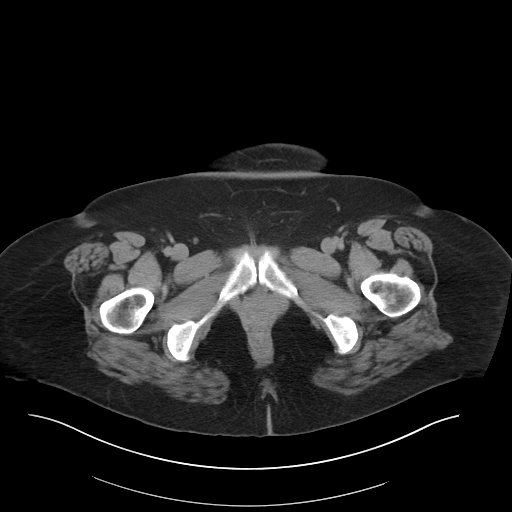
[im 19/99  soft-tissue]
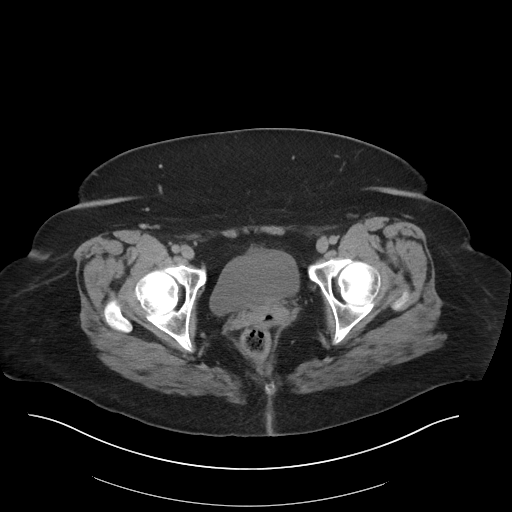
[im 27/99  soft-tissue]
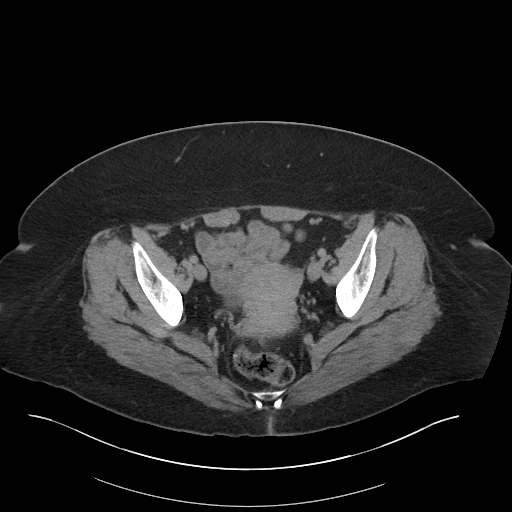
[im 34/99  soft-tissue]
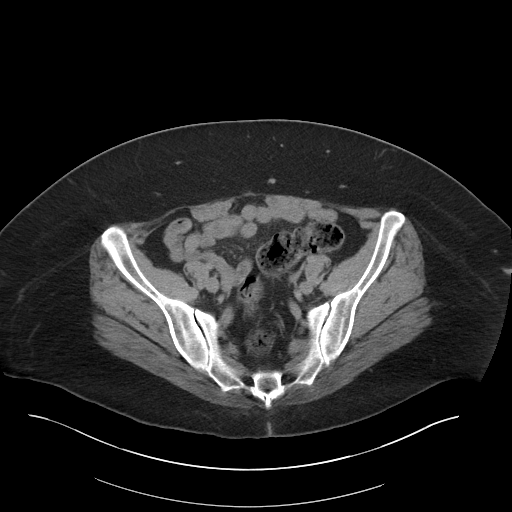
[im 42/99  soft-tissue]
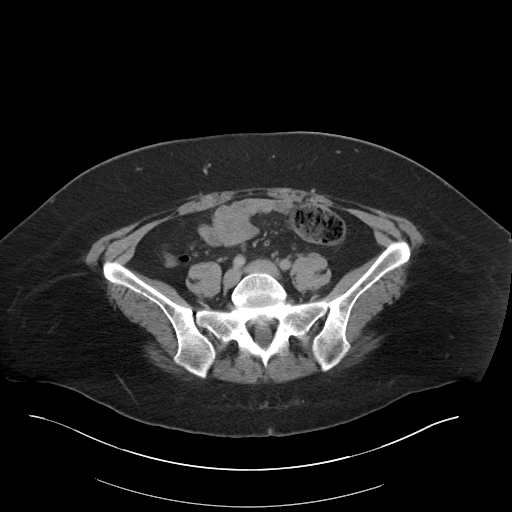
[im 50/99  soft-tissue]
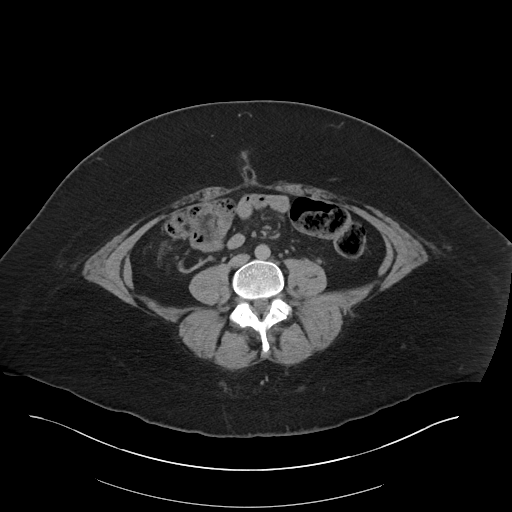
[im 57/99  soft-tissue]
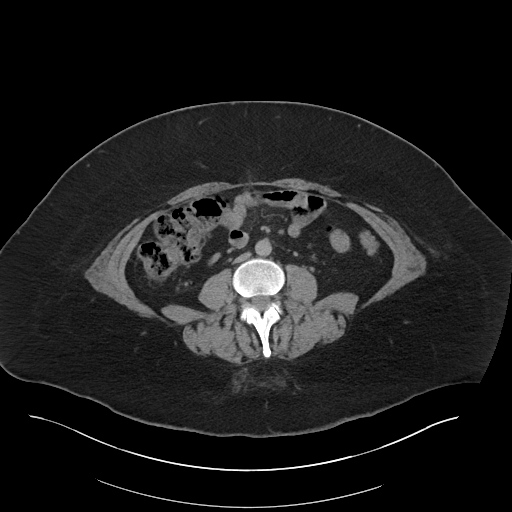
[im 65/99  soft-tissue]
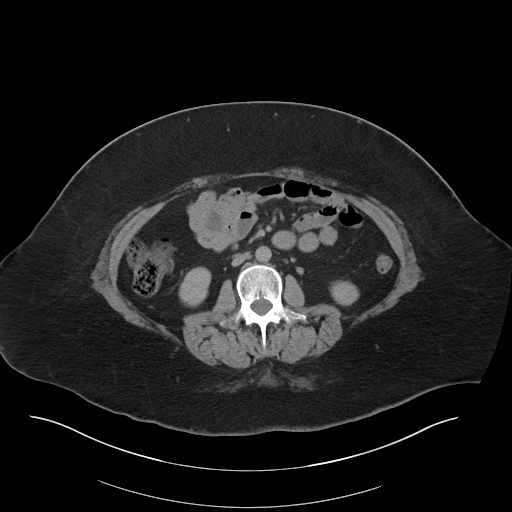
[im 65/99  bone]
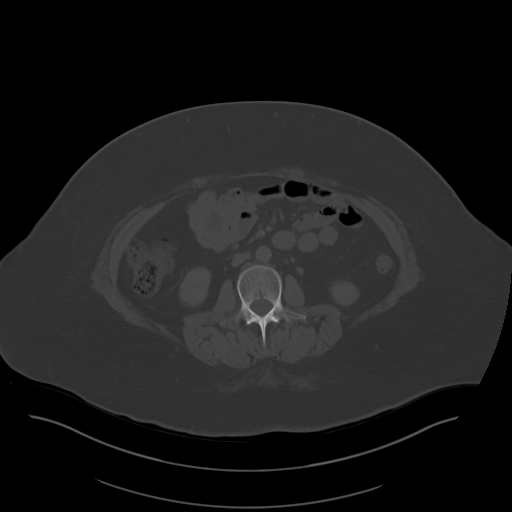
[im 72/99  soft-tissue]
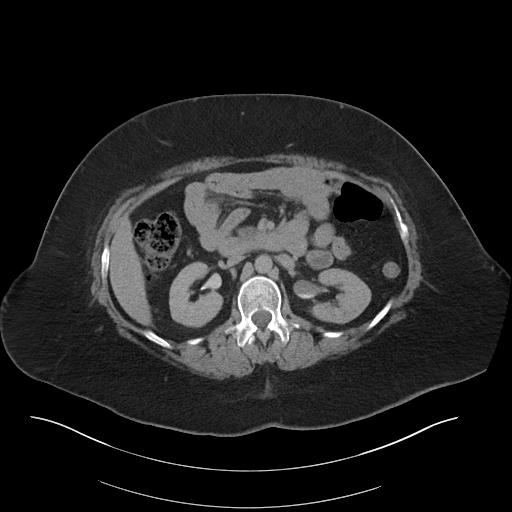
[im 80/99  soft-tissue]
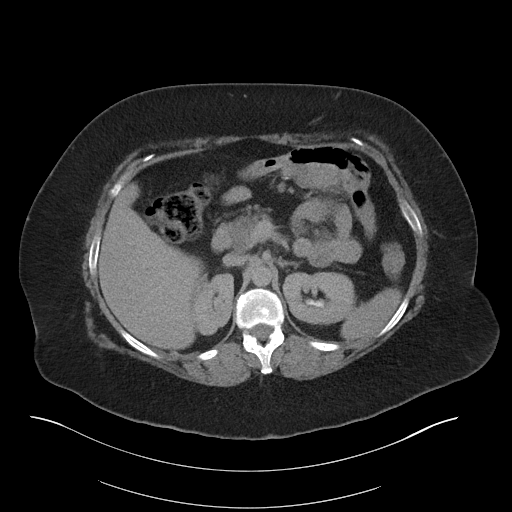
[im 87/99  soft-tissue]
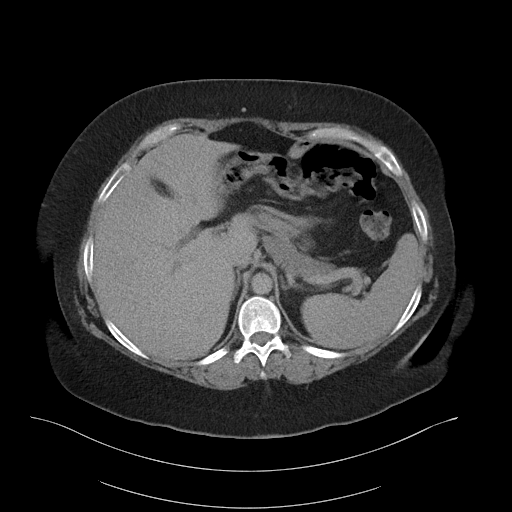
[im 95/99  soft-tissue]
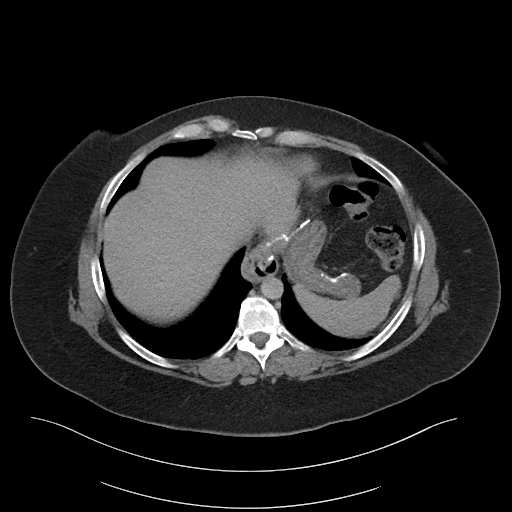

[Series 5: coronal · coronal · 0.93mm/px · 3 of 122 slices shown]
[im 41/122  soft-tissue]
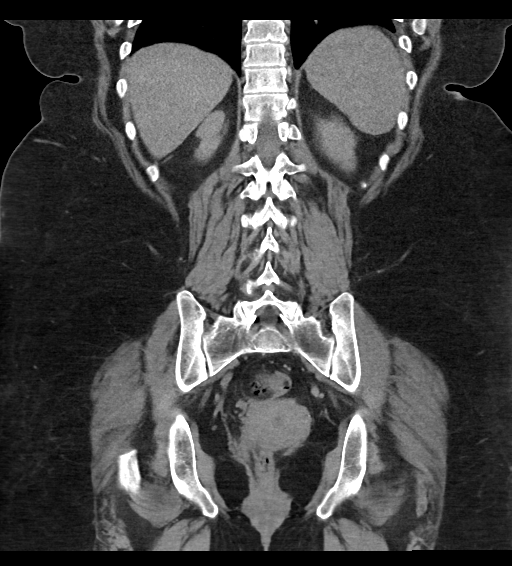
[im 54/122  soft-tissue]
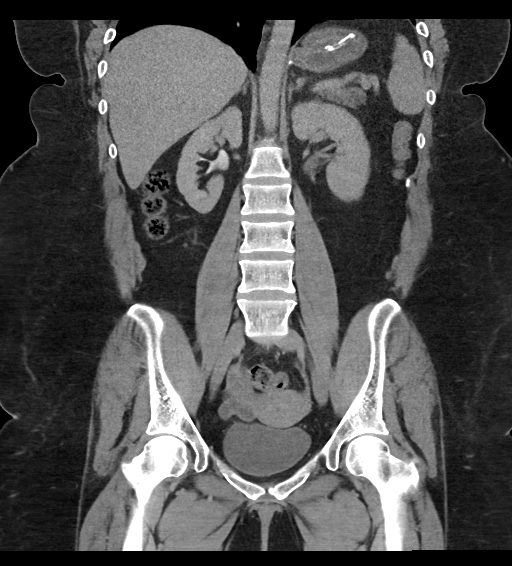
[im 68/122  soft-tissue]
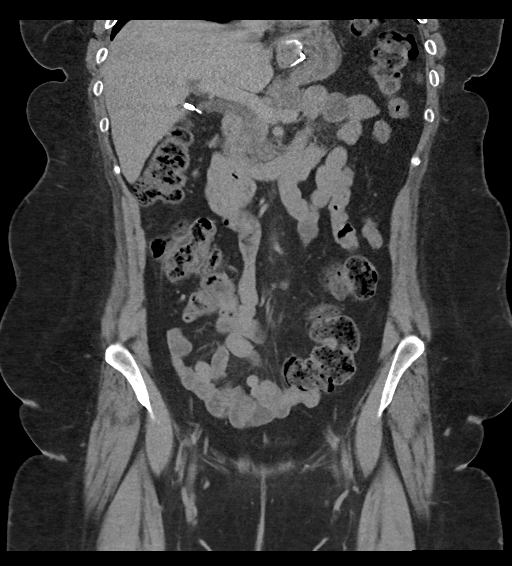

[16 of 46 positions shown; findings below may reference images not displayed]

FINDINGS: Lower chest: Lung bases are clear.

Hepatobiliary: Liver is within normal limits.

Status post cholecystectomy. No intrahepatic ductal dilatation.
Common duct measures 10 mm.

Pancreas: Within normal limits.

Spleen: Within normal limits.

Adrenals/Urinary Tract: Adrenal glands are within normal limits.

Kidneys are notable for excretory contrast in the bilateral renal
collecting systems.

Bladder is within normal limits.

Stomach/Bowel: Stomach is notable for a small gastric pouch with
postsurgical changes related to gastric bypass.

No evidence of bowel obstruction.

Normal appendix (series 2/image 56).

No colonic wall thickening or inflammatory changes.

Vascular/Lymphatic: No evidence of abdominal aortic aneurysm.

No suspicious abdominopelvic lymphadenopathy.

Reproductive: Uterus is within normal limits.

Bilateral ovaries are within normal limits.

Other: No abdominopelvic ascites.

Musculoskeletal: Visualized osseous structures are within normal
limits.
IMPRESSION: Status post gastric bypass. No evidence of bowel obstruction. Normal
appendix.

Status post cholecystectomy. Common duct measures 10 mm, likely
postsurgical.

No CT findings to account for the patient's epigastric pain.

## 2021-05-25 MED ORDER — ONDANSETRON HCL 4 MG/2ML IJ SOLN
4.0000 mg | Freq: Once | INTRAMUSCULAR | Status: AC
  Administered 2021-05-25: 4 mg via INTRAVENOUS
  Filled 2021-05-25: qty 2

## 2021-05-25 MED ORDER — ALUM & MAG HYDROXIDE-SIMETH 200-200-20 MG/5ML PO SUSP
30.0000 mL | Freq: Once | ORAL | Status: AC
  Administered 2021-05-25: 30 mL via ORAL
  Filled 2021-05-25: qty 30

## 2021-05-25 MED ORDER — OXYCODONE-ACETAMINOPHEN 5-325 MG PO TABS
1.0000 | ORAL_TABLET | Freq: Once | ORAL | Status: AC
  Administered 2021-05-25: 1 via ORAL
  Filled 2021-05-25: qty 1

## 2021-05-25 MED ORDER — LIDOCAINE VISCOUS HCL 2 % MT SOLN
15.0000 mL | Freq: Once | OROMUCOSAL | Status: AC
  Administered 2021-05-25: 15 mL via ORAL
  Filled 2021-05-25: qty 15

## 2021-05-25 MED ORDER — IOHEXOL 300 MG/ML  SOLN
100.0000 mL | Freq: Once | INTRAMUSCULAR | Status: AC | PRN
  Administered 2021-05-25: 100 mL via INTRAVENOUS

## 2021-05-25 MED ORDER — HYDROMORPHONE HCL 1 MG/ML IJ SOLN
1.0000 mg | Freq: Once | INTRAMUSCULAR | Status: AC
  Administered 2021-05-25: 1 mg via INTRAVENOUS
  Filled 2021-05-25: qty 1

## 2021-05-25 NOTE — Discharge Instructions (Signed)
Your testing is reassuring.  Your labs and CT scan are stable.  Follow-up with your primary doctor as well as surgeon and gastroenterologist for the endoscopy as scheduled.  Take the nausea medication as prescribed.  Avoid alcohol, caffeine, NSAID medications, spicy foods. Return to the ED with new or worsening symptoms

## 2021-05-28 ENCOUNTER — Encounter (HOSPITAL_BASED_OUTPATIENT_CLINIC_OR_DEPARTMENT_OTHER): Payer: Self-pay | Admitting: Obstetrics and Gynecology

## 2021-05-28 ENCOUNTER — Other Ambulatory Visit: Payer: Self-pay

## 2021-05-28 ENCOUNTER — Emergency Department (HOSPITAL_BASED_OUTPATIENT_CLINIC_OR_DEPARTMENT_OTHER)
Admission: EM | Admit: 2021-05-28 | Discharge: 2021-05-29 | Disposition: A | Discharge status: Home / Self Care | Payer: BC Managed Care – PPO | Attending: Emergency Medicine | Admitting: Emergency Medicine

## 2021-05-28 DIAGNOSIS — Z79899 Other long term (current) drug therapy: Secondary | ICD-10-CM | POA: Insufficient documentation

## 2021-05-28 DIAGNOSIS — K219 Gastro-esophageal reflux disease without esophagitis: Secondary | ICD-10-CM | POA: Insufficient documentation

## 2021-05-28 DIAGNOSIS — R109 Unspecified abdominal pain: Secondary | ICD-10-CM | POA: Diagnosis not present

## 2021-05-28 DIAGNOSIS — I10 Essential (primary) hypertension: Secondary | ICD-10-CM | POA: Insufficient documentation

## 2021-05-28 DIAGNOSIS — R112 Nausea with vomiting, unspecified: Secondary | ICD-10-CM | POA: Diagnosis not present

## 2021-05-28 DIAGNOSIS — R1030 Lower abdominal pain, unspecified: Secondary | ICD-10-CM | POA: Diagnosis not present

## 2021-05-28 DIAGNOSIS — E039 Hypothyroidism, unspecified: Secondary | ICD-10-CM | POA: Diagnosis not present

## 2021-05-28 LAB — COMPREHENSIVE METABOLIC PANEL
ALT: 15 U/L (ref 0–44)
AST: 12 U/L — ABNORMAL LOW (ref 15–41)
Albumin: 4.1 g/dL (ref 3.5–5.0)
Alkaline Phosphatase: 133 U/L — ABNORMAL HIGH (ref 38–126)
Anion gap: 7 (ref 5–15)
BUN: 12 mg/dL (ref 6–20)
CO2: 26 mmol/L (ref 22–32)
Calcium: 9.2 mg/dL (ref 8.9–10.3)
Chloride: 107 mmol/L (ref 98–111)
Creatinine, Ser: 0.59 mg/dL (ref 0.44–1.00)
GFR, Estimated: >60 mL/min (ref >60)
Glucose, Bld: 90 mg/dL (ref 70–99)
Potassium: 3.7 mmol/L (ref 3.5–5.1)
Sodium: 140 mmol/L (ref 135–145)
Total Bilirubin: 0.2 mg/dL — ABNORMAL LOW (ref 0.3–1.2)
Total Protein: 6.6 g/dL (ref 6.5–8.1)

## 2021-05-28 LAB — CBC
HCT: 39.7 % (ref 36.0–46.0)
Hemoglobin: 13 g/dL (ref 12.0–15.0)
MCH: 27.5 pg (ref 26.0–34.0)
MCHC: 32.7 g/dL (ref 30.0–36.0)
MCV: 84.1 fL (ref 80.0–100.0)
Platelets: 391 10*3/uL (ref 150–400)
RBC: 4.72 MIL/uL (ref 3.87–5.11)
RDW: 14.7 % (ref 11.5–15.5)
WBC: 13.9 10*3/uL — ABNORMAL HIGH (ref 4.0–10.5)
nRBC: 0 % (ref 0.0–0.2)

## 2021-05-28 LAB — LIPASE, BLOOD: Lipase: 16 U/L (ref 11–51)

## 2021-05-28 MED ORDER — HYDROMORPHONE HCL 1 MG/ML IJ SOLN
1.0000 mg | Freq: Once | INTRAMUSCULAR | Status: AC
  Administered 2021-05-28: 1 mg via INTRAVENOUS
  Filled 2021-05-28: qty 1

## 2021-05-28 MED ORDER — SODIUM CHLORIDE 0.9 % IV BOLUS
1000.0000 mL | Freq: Once | INTRAVENOUS | Status: AC
  Administered 2021-05-28: 1000 mL via INTRAVENOUS

## 2021-05-28 MED ORDER — SODIUM CHLORIDE 0.9 % IV SOLN
25.0000 mg | Freq: Once | INTRAVENOUS | Status: AC
  Administered 2021-05-28: 25 mg via INTRAVENOUS
  Filled 2021-05-28: qty 1

## 2021-05-28 NOTE — ED Notes (Signed)
ED Provider at bedside. 

## 2021-05-28 NOTE — ED Triage Notes (Signed)
Patient reports she was seen on 12/9 for N/E and treated with fluids, pain medications, and nausea medications. Patient reports she went home and since that night has still not been able to hold down food/fluids. Patient reports she is being worked up outpatient for an ulcer but has not yet had an endoscopy.

## 2021-05-28 NOTE — ED Provider Notes (Signed)
[facility] EMERGENCY DEPT Provider Note   CSN: [id number] Arrival date & time: 05/28/21  2043     History Chief Complaint  Patient presents with   Emesis    [name] [name] is a 35 y.o. female.  The history is provided by the patient and medical records. No language interpreter was used.  Emesis Severity:  Severe Duration:  4 days Timing:  Constant Quality:  Stomach contents Progression:  Unchanged Chronicity:  Recurrent Recent urination:  Decreased Relieved by:  Nothing Worsened by:  Nothing Ineffective treatments:  None tried Associated symptoms: abdominal pain   Associated symptoms: no chills, no cough, no diarrhea, no fever and no headaches   Risk factors: prior abdominal surgery       Past Medical History:  Diagnosis Date   ADD (attention deficit disorder)    Anemia    Anxiety    B12 deficiency    Back pain    Bilateral swelling of feet    Bipolar 2 disorder (HCC) 09/19/2019   Constipation    DEPRESSION    Elevated cholesterol    Family history of colon cancer 25-May-2019   Father, 27s; deceased.    Fatty liver    Fibromyalgia    Gallbladder problem    GERD (gastroesophageal reflux disease)    Heartburn    History of stomach ulcers    Hypertension    Hypothyroidism    Hx of, normalized TSH during pregnancy 2011   Infertility, female    Lactose intolerance    Migraines    Morbid obesity (HCC)    s/p RY 08/2012 - start weight 290 pounds   Nephrolithiasis    Palpitations    Panic disorder    PCOS (polycystic ovarian syndrome)    PONV (postoperative nausea and vomiting)    Pregnancy induced hypertension    Shortness of breath    Vitamin D deficiency     Patient Active Problem List   Diagnosis Date Noted   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   Plantar fasciitis 10/31/2019   Bipolar 2 disorder (HCC) 09/19/2019   Fibromyalgia 09/09/2019   Ischial bursitis of left side 07/19/2019   Vitamin B12 deficiency  06/01/2019   Vitamin D deficiency 06/01/2019   Panic disorder 06/01/2019   Family history of colon cancer May 25, 2019   Chronic constipation 2019/05/25   Major depression, recurrent, chronic (HCC) 05/25/19   Insomnia due to other mental disorder 03/13/2019   Pain in both lower extremities 11/21/2018   Nocturnal leg cramps 10/26/2018   Nephrolithiasis    Reduced libido, due to Lexapro 07/09/2017   Binge eating disorder 04/23/2017   Acquired iron deficiency anemia due to decreased absorption 02/22/2015   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    Past Surgical History:  Procedure Laterality Date   Quintella Reichert OSTEOTOMY Left 06/24/2017   Procedure: Quintella Reichert OSTEOTOMY;  Surgeon: [name], DPM;  Location: [facility];  Service: Podiatry;  Laterality: Left;   BUNIONECTOMY Left 06/24/2017   Procedure: Anthoney Harada;  Surgeon: [name], DPM;  Location: [facility];  Service: Podiatry;  Laterality: Left;   CESAREAN SECTION     x 2   CHOLECYSTECTOMY N/A 02/15/2019   Procedure: LAPAROSCOPIC CHOLECYSTECTOMY WITH INTRAOPERATIVE CHOLANGIOGRAM;  Surgeon: Andria Meuse, MD;  Location: [facility];  Service: General;  Laterality: N/A;   COLON RESECTION N/A 05/04/2020   Procedure: DIAGNOSTIC LAPAROSCOPY, RESECTION OF CANDYCANE, UPPER ENDOSCOPY;  Surgeon: Berna Bue, MD;  Location:  [facility];  Service: General;  Laterality: N/A;   FOOT SURGERY     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   Procedure: LAPAROSCOPIC ROUX-EN-Y GASTRIC;  Surgeon: Atilano Ina, MD;  Location: [facility];  Service: General;  Laterality: N/A;  laparoscopic roux-en-y gastric bypass   LITHOTRIPSY Left    TONSILLECTOMY     TUBAL LIGATION     UPPER GI ENDOSCOPY  08/24/2012   Procedure: UPPER GI ENDOSCOPY;  Surgeon: Atilano Ina, MD;  Location: [facility];  Service: General;;   WISDOM TOOTH EXTRACTION       OB History     Gravida  2   Para  2   Term  2   Preterm  0   AB  0    Living  2      SAB  0   IAB  0   Ectopic  0   Multiple  0   Live Births  2           Family History  Problem Relation Age of Onset   Hyperlipidemia Father    Hypertension Father    Kidney disease Father    Colon cancer Father 22   Cancer Father    Depression Father    Anxiety disorder Father    Bipolar disorder Father    Sleep apnea Father    Obesity Father    Hypertension Mother    Depression Mother    Anxiety disorder Mother    Obesity Mother    Hypertension Maternal Grandmother    Uterine cancer Maternal Grandmother 36   Diabetes Maternal Grandfather     Social History   Tobacco Use   Smoking status: Never   Smokeless tobacco: Never  Vaping Use   Vaping Use: Never used  Substance Use Topics   Alcohol use: Not Currently    Comment: rare   Drug use: No    Home Medications Prior to Admission medications   Medication Sig Start Date End Date Taking? Authorizing Provider  acetaminophen (TYLENOL) 500 MG tablet Take 2 tablets (1,000 mg total) by mouth every 6 (six) hours as needed for moderate pain. 02/13/19   Fayrene Helper, PA-C  baclofen (LIORESAL) 10 MG tablet TAKE 1 TABLET BY MOUTH THREE TIMES A DAY AS NEEDED FOR MUSCLE SPASMS 03/27/21   Rodolph Bong, MD  busPIRone (BUSPAR) 15 MG tablet Take 15 mg by mouth 3 (three) times daily. 11/01/19   [provider]  CALCIUM PO Take 1 tablet by mouth daily.    [provider]  clonazePAM (KLONOPIN) 1 MG tablet Take 0.5 tablets (0.5 mg total) by mouth 2 (two) times daily as needed for anxiety. 03/27/21   Willow Ora, MD  clotrimazole (MYCELEX) 10 MG troche Take 10 mg by mouth 5 (five) times daily. 09/24/20   [provider]  cyanocobalamin (,VITAMIN B-12,) 1000 MCG/ML injection Inject 1 vial per week for 3 weeks, then 1 vial per month thereafter 06/21/20   Willow Ora, MD  cyclobenzaprine (FLEXERIL) 10 MG tablet Take 1 tablet (10 mg total) by mouth 3 (three) times daily as needed for  muscle spasms. 01/31/21   Kirsteins, Victorino Sparrow, MD  diclofenac Sodium (VOLTAREN) 1 % GEL Apply 4 g topically 4 (four) times daily. Patient taking differently: Apply 4 g topically 4 (four) times daily as needed (pain). 05/13/19   Palumbo, April, MD  docusate sodium (COLACE) 100 MG capsule Take 1 capsule (100 mg total) by mouth 2 (two) times daily.  05/06/20   Emelia Loron, MD  hydrOXYzine (VISTARIL) 50 MG capsule Take 50 mg by mouth every 6 (six) hours as needed. 12/26/20   [provider]  lamoTRIgine (LAMICTAL) 150 MG tablet Take 300 mg by mouth daily.  04/10/20   [provider]  lithium carbonate (LITHOBID) 300 MG CR tablet Take 600 mg by mouth at bedtime.    [provider]  Magnesium 500 MG TABS Take 1 tablet by mouth daily.    [provider]  metFORMIN (GLUCOPHAGE) 500 MG tablet Take 500 mg by mouth 2 (two) times daily. 11/01/19   [provider]  nortriptyline (PAMELOR) 10 MG capsule TAKE 1 CAPSULE BY MOUTH AT BEDTIME. 04/04/21   Kirsteins, Victorino Sparrow, MD  ondansetron (ZOFRAN) 4 MG tablet Take 1 tablet (4 mg total) by mouth every 6 (six) hours. 09/22/20   Jacalyn Lefevre, MD  pantoprazole (PROTONIX) 20 MG tablet Take 1 tablet (20 mg total) by mouth daily. 12/14/20   Willow Ora, MD  QUEtiapine (SEROQUEL) 400 MG tablet Take 400 mg by mouth at bedtime.  08/24/19   [provider]  QUEtiapine (SEROQUEL) 50 MG tablet Take by mouth. 06/27/20   [provider]  SYRINGE-NEEDLE, DISP, 3 ML 25G X 1" 3 ML MISC Use 1 needle per application once weekly 06/21/20   Willow Ora, MD  tizanidine (ZANAFLEX) 2 MG capsule Take 2 capsules (4 mg total) by mouth 3 (three) times daily as needed for muscle spasms. 11/15/20   Rodolph Bong, MD  traMADol (ULTRAM) 50 MG tablet Take 1 tablet (50 mg total) by mouth every 12 (twelve) hours as needed. 05/07/21   Kirsteins, Victorino Sparrow, MD  valACYclovir (VALTREX) 1000 MG tablet Take 1 tablet (1,000 mg total) by mouth  3 (three) times daily. Patient taking differently: Take 1,000 mg by mouth 3 (three) times daily as needed (cold sores). 05/25/19   Bennie Pierini, FNP    Allergies    Nsaids, Other, and Zolpidem  Review of Systems   Review of Systems  Constitutional:  Positive for fatigue. Negative for chills, diaphoresis and fever.  HENT:  Negative for congestion.   Respiratory:  Negative for cough, chest tightness, shortness of breath and wheezing.   Cardiovascular:  Negative for chest pain.  Gastrointestinal:  Positive for abdominal pain, nausea and vomiting. Negative for constipation and diarrhea.  Genitourinary:  Positive for decreased urine volume. Negative for dysuria and flank pain.  Musculoskeletal:  Negative for back pain, neck pain and neck stiffness.  Skin:  Negative for wound.  Neurological:  Negative for weakness, light-headedness, numbness and headaches.  Psychiatric/Behavioral:  Negative for agitation.   All other systems reviewed and are negative.  Physical Exam Updated Vital Signs BP (!) 89/62 (BP Location: Right Arm)    Pulse 98    Temp 98.9 F (37.2 C) (Oral)    Resp 16    Ht  (1.626 m)    Wt 124 kg    SpO2 100%    BMI 46.92 kg/m   Physical Exam Vitals and nursing note reviewed.  Constitutional:      General: She is not in acute distress.    Appearance: She is well-developed. She is not ill-appearing, toxic-appearing or diaphoretic.  HENT:     Head: Normocephalic and atraumatic.     Nose: No congestion or rhinorrhea.     Mouth/Throat:     Mouth: Mucous membranes are dry.     Pharynx: No oropharyngeal exudate or posterior  oropharyngeal erythema.  Eyes:     Extraocular Movements: Extraocular movements intact.     Conjunctiva/sclera: Conjunctivae normal.     Pupils: Pupils are equal, round, and reactive to light.  Cardiovascular:     Rate and Rhythm: Normal rate and regular rhythm.     Heart sounds: No murmur heard. Pulmonary:     Effort: Pulmonary effort is  normal. No respiratory distress.     Breath sounds: Normal breath sounds.  Abdominal:     Palpations: Abdomen is soft.     Tenderness: There is abdominal tenderness. There is no right CVA tenderness, left CVA tenderness, guarding or rebound.  Musculoskeletal:        General: No swelling or tenderness.     Cervical back: Neck supple. No tenderness.     Right lower leg: No edema.     Left lower leg: No edema.  Skin:    General: Skin is warm and dry.     Capillary Refill: Capillary refill takes less than 2 seconds.     Findings: No erythema.  Neurological:     General: No focal deficit present.     Mental Status: She is alert.  Psychiatric:        Mood and Affect: Mood normal.    ED Results / Procedures / Treatments   Labs (all labs ordered are listed, but only abnormal results are displayed) Labs Reviewed  COMPREHENSIVE METABOLIC PANEL - Abnormal; Notable for the following components:      Result Value   AST 12 (*)    Alkaline Phosphatase 133 (*)    Total Bilirubin 0.2 (*)    All other components within normal limits  CBC - Abnormal; Notable for the following components:   WBC 13.9 (*)    All other components within normal limits  LIPASE, BLOOD  URINALYSIS, ROUTINE W REFLEX MICROSCOPIC    EKG None  Radiology No results found.  Procedures Procedures   Medications Ordered in ED Medications  sodium chloride 0.9 % bolus 1,000 mL (0 mLs Intravenous Stopped 05/28/21 2217)  sodium chloride 0.9 % bolus 1,000 mL (1,000 mLs Intravenous New Bag/Given 05/28/21 2218)  HYDROmorphone (DILAUDID) injection 1 mg (1 mg Intravenous Given 05/28/21 2146)  promethazine (PHENERGAN) 25 mg in sodium chloride 0.9 % 50 mL IVPB (25 mg Intravenous New Bag/Given 05/28/21 2145)    ED Course  I have reviewed the triage vital signs and the nursing notes.  Pertinent labs & imaging results that were available during my care of the patient were reviewed by me and considered in my medical decision  making (see chart for details).    MDM Rules/Calculators/A&P                           LEILANY DIGERONIMO is a 35 y.o. female with a past medical history significant for Roux-en-Y gastric bypass, fibromyalgia, bipolar disorder, previous small bowel intussusception, kidney stones, previous cholecystectomy, and previous stomach ulcers with chronic abdominal pain who presents with nausea vomiting, dehydration, and abdominal pain.  Patient reports that for the last several week she has been having the symptoms and had a extensive work-up several days ago including a CT of her abdomen pelvis and labs.  She reports she was started to feel better and was able to be discharged home but then over the last 4 days has had nearly 0 p.o. intake with nausea, vomiting, and continued abdominal pain.  She reports this pain is  no different than before and denies any new trauma.   On exam, lungs clear and chest nontender.  Abdomen mildly tender.  Bowel sounds were appreciated.  Patient moving all extremities.  Mucous membranes are dry.  Vital signs initially showed soft pressures but on my reassessment were improving.  Clinically I am concerned that patient is dehydrated related to the continued nausea, and vomiting.  Had a long shared decision-making conversation with patient and we did discuss getting repeat imaging however given the reassuring imaging recently and similar symptoms, patient agrees to hold on imaging but does agree with getting repeat labs, fluids, pain medicine, nausea medicine.  If work-up is reassuring, dissipate discharge with symptomatic management medications and follow-up with her GI team.  Care transferred to oncoming team to await reassessment and work-up to be completed.  Final Clinical Impression(s) / ED Diagnoses Final diagnoses:  Nausea and vomiting, unspecified vomiting type    Clinical Impression: 1. Nausea and vomiting, unspecified vomiting type     Disposition: Care  transferred to oncoming team to await reassessment and work-up to be completed.  This note was prepared with assistance of Conservation officer, historic buildings. Occasional wrong-word or sound-a-like substitutions may have occurred due to the inherent limitations of voice recognition software.     Justo Hengel, Canary Brim, MD 05/29/21 [phone]

## 2021-05-29 LAB — URINALYSIS, ROUTINE W REFLEX MICROSCOPIC
Bilirubin Urine: NEGATIVE
Glucose, UA: NEGATIVE mg/dL
Hgb urine dipstick: NEGATIVE
Ketones, ur: NEGATIVE mg/dL
Leukocytes,Ua: NEGATIVE
Nitrite: NEGATIVE
Protein, ur: NEGATIVE mg/dL
Specific Gravity, Urine: 1.017 (ref 1.005–1.030)
pH: 7 (ref 5.0–8.0)

## 2021-05-29 MED ORDER — HYDROMORPHONE HCL 1 MG/ML IJ SOLN
1.0000 mg | Freq: Once | INTRAMUSCULAR | Status: AC
  Administered 2021-05-29: 01:00:00 1 mg via INTRAVENOUS
  Filled 2021-05-29: qty 1

## 2021-05-29 MED ORDER — PROMETHAZINE HCL 25 MG PO TABS: 25.0000 mg | ORAL_TABLET | Freq: Four times a day (QID) | ORAL | 0 refills | Status: DC | PRN

## 2021-05-29 MED ORDER — OXYCODONE HCL 5 MG PO TABS: 5.0000 mg | ORAL_TABLET | ORAL | 0 refills | Status: DC | PRN

## 2021-05-29 NOTE — Discharge Instructions (Addendum)
Return if symptoms or not being adequately controlled at home. 

## 2021-05-29 NOTE — ED Provider Notes (Signed)
Care assumed from Dr. Rush Landmark, patient with nausea, vomiting with negative ED work-up, urinalysis pending.  She is getting IV fluids and antiemetics and will need to be reassessed.  Urinalysis is normal.  Patient is feeling much better following IV fluids, promethazine, hydromorphone.  She is requesting prescriptions for antiemetic and analgesics at home.  She is discharged with prescriptions for promethazine and a small number of oxycodone tablets.  Follow-up with PCP.  Return precautions discussed.   Dione Booze, MD 05/29/21 [phone]

## 2021-05-30 ENCOUNTER — Other Ambulatory Visit: Payer: Self-pay | Admitting: General Surgery

## 2021-05-30 ENCOUNTER — Ambulatory Visit: Payer: Self-pay | Admitting: General Surgery

## 2021-05-30 DIAGNOSIS — Z9884 Bariatric surgery status: Secondary | ICD-10-CM | POA: Diagnosis not present

## 2021-05-30 DIAGNOSIS — R1013 Epigastric pain: Secondary | ICD-10-CM | POA: Diagnosis not present

## 2021-05-30 DIAGNOSIS — R112 Nausea with vomiting, unspecified: Secondary | ICD-10-CM | POA: Diagnosis not present

## 2021-05-31 ENCOUNTER — Other Ambulatory Visit: Payer: Self-pay

## 2021-05-31 ENCOUNTER — Ambulatory Visit (INDEPENDENT_AMBULATORY_CARE_PROVIDER_SITE_OTHER): Payer: BC Managed Care – PPO

## 2021-05-31 ENCOUNTER — Other Ambulatory Visit: Payer: Self-pay | Admitting: General Surgery

## 2021-05-31 VITALS — BP 92/61 | HR 89 | Temp 97.8°F | Resp 16 | Ht 64.0 in | Wt 272.2 lb

## 2021-05-31 DIAGNOSIS — R112 Nausea with vomiting, unspecified: Secondary | ICD-10-CM

## 2021-05-31 DIAGNOSIS — E860 Dehydration: Secondary | ICD-10-CM | POA: Insufficient documentation

## 2021-05-31 DIAGNOSIS — Z9884 Bariatric surgery status: Secondary | ICD-10-CM

## 2021-05-31 MED ORDER — SODIUM CHLORIDE 0.9 % IV BOLUS
1000.0000 mL | Freq: Once | INTRAVENOUS | Status: AC
  Administered 2021-05-31: 1000 mL via INTRAVENOUS
  Filled 2021-05-31: qty 1000

## 2021-05-31 NOTE — Progress Notes (Signed)
Diagnosis: Drhydration  Provider:  Chilton Greathouse, MD  Procedure: Infusion  IV Type: Peripheral, IV Location: L Antecubital  Normal Saline, Dose:  Infusion Start Time: 1318  Infusion Stop Time: 1430  Post Infusion IV Care: Peripheral IV Discontinued  Discharge: Condition: Good, Destination: Home . AVS provided to patient.   Performed by:  Nat Math, RN

## 2021-06-03 ENCOUNTER — Other Ambulatory Visit (HOSPITAL_BASED_OUTPATIENT_CLINIC_OR_DEPARTMENT_OTHER): Payer: Self-pay

## 2021-06-03 ENCOUNTER — Ambulatory Visit (INDEPENDENT_AMBULATORY_CARE_PROVIDER_SITE_OTHER): Payer: BC Managed Care – PPO

## 2021-06-03 ENCOUNTER — Other Ambulatory Visit: Payer: Self-pay | Admitting: General Surgery

## 2021-06-03 ENCOUNTER — Other Ambulatory Visit: Payer: Self-pay

## 2021-06-03 VITALS — BP 113/74 | HR 76 | Temp 98.1°F | Resp 16 | Ht 64.0 in | Wt 265.8 lb

## 2021-06-03 DIAGNOSIS — E860 Dehydration: Secondary | ICD-10-CM

## 2021-06-03 MED ORDER — SODIUM CHLORIDE 0.9 % IV SOLN
INTRAVENOUS | Status: AC
  Administered 2021-06-21: 1000 mL via INTRAVENOUS

## 2021-06-03 MED ORDER — TRAMADOL HCL 50 MG PO TABS
ORAL_TABLET | ORAL | 0 refills | Status: DC
  Filled 2021-06-03: qty 15, 3d supply, fill #0

## 2021-06-03 NOTE — Progress Notes (Signed)
Diagnosis: Hydration  Provider:  Chilton Greathouse, MD  Procedure: Infusion  IV Type: Peripheral, IV Location: L Hand  Normal Saline, Dose: 1000 ml  Infusion Start Time: 0910  Infusion Stop Time: 1010  Post Infusion IV Care: Peripheral IV Discontinued  Discharge: Condition: Good, Destination: Home . AVS provided to patient.   Performed by:  Nat Math, RN

## 2021-06-04 ENCOUNTER — Encounter (HOSPITAL_BASED_OUTPATIENT_CLINIC_OR_DEPARTMENT_OTHER): Payer: Self-pay | Admitting: Pharmacist

## 2021-06-04 ENCOUNTER — Other Ambulatory Visit: Payer: Self-pay | Admitting: General Surgery

## 2021-06-04 ENCOUNTER — Other Ambulatory Visit (HOSPITAL_BASED_OUTPATIENT_CLINIC_OR_DEPARTMENT_OTHER): Payer: Self-pay

## 2021-06-04 ENCOUNTER — Ambulatory Visit
Admission: RE | Admit: 2021-06-04 | Discharge: 2021-06-04 | Disposition: A | Discharge status: Home / Self Care | Payer: BC Managed Care – PPO | Source: Ambulatory Visit | Attending: General Surgery | Admitting: General Surgery

## 2021-06-04 DIAGNOSIS — Z9884 Bariatric surgery status: Secondary | ICD-10-CM

## 2021-06-04 DIAGNOSIS — R112 Nausea with vomiting, unspecified: Secondary | ICD-10-CM | POA: Diagnosis not present

## 2021-06-04 DIAGNOSIS — K219 Gastro-esophageal reflux disease without esophagitis: Secondary | ICD-10-CM | POA: Diagnosis not present

## 2021-06-04 IMAGING — RF DG UGI W SINGLE CM
5 series · 14 of 24 positions shown · non-contrast
Comparison: CT abdomen pelvis [DATE]

CLINICAL DATA: Nausea and vomiting, unspecified vomiting type. Hx
of gastric bypass

EXAM:
UPPER GI SERIES WITH KUB
TECHNIQUE: After obtaining a scout radiograph a routine upper GI series was
performed using thin barium
FLUOROSCOPY TIME:  Fluoroscopy Time:  1 minute 54 seconds
Radiation Exposure Index (if provided by the fluoroscopic device):
41.15 mGy
Number of Acquired Spot Images: 16 images.  Two cine clips.

[Series 1: one shot · 0.14mm/px · 1 of 1 slices shown (1 of 3)]
[im 1/1]
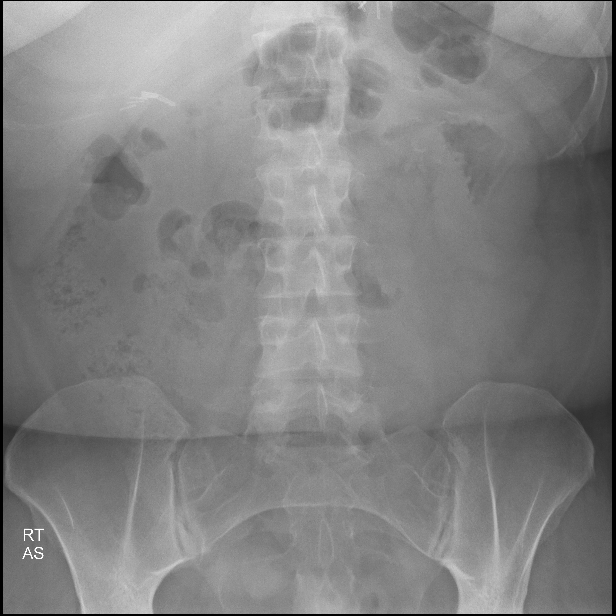

[Series 2: sequence · 2 of 45 frames shown (1 of 2)]
[frame 23/45]
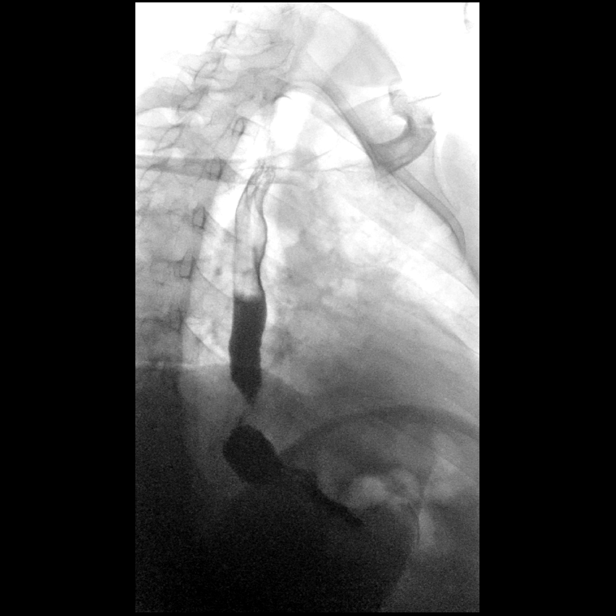
[frame 39/45]
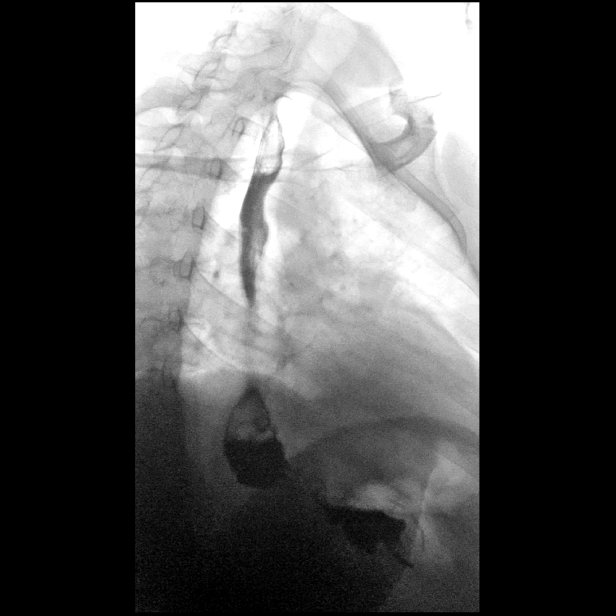

[Series 3: one shot · 6 of 11 slices shown (2 of 3)]
[im 2/11]
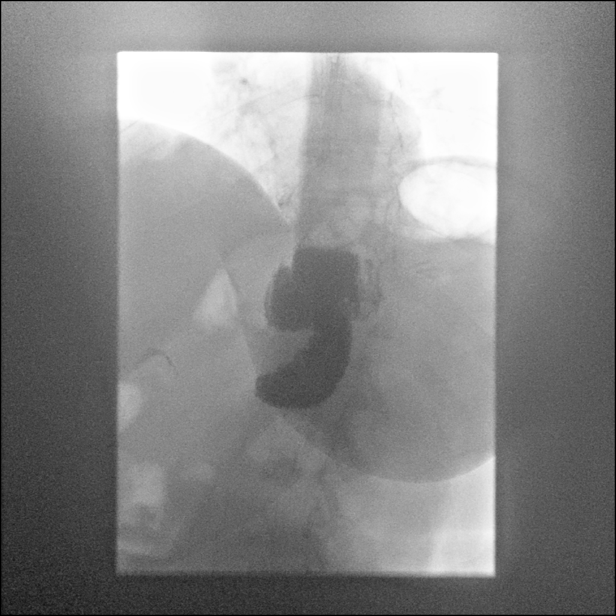
[im 3/11]
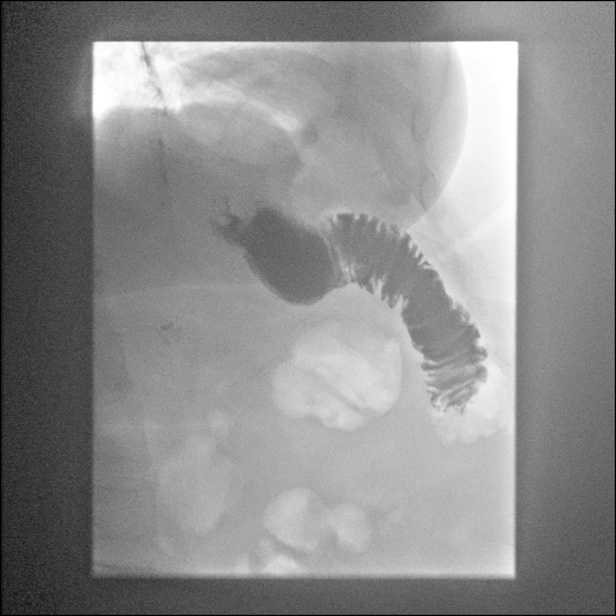
[im 5/11]
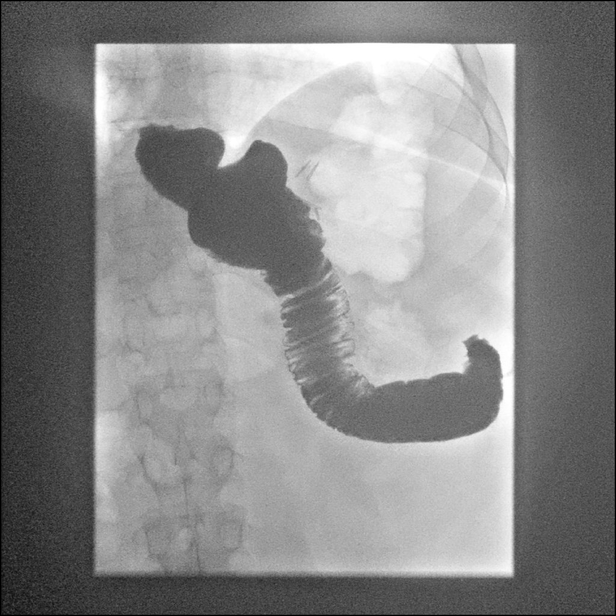
[im 7/11]
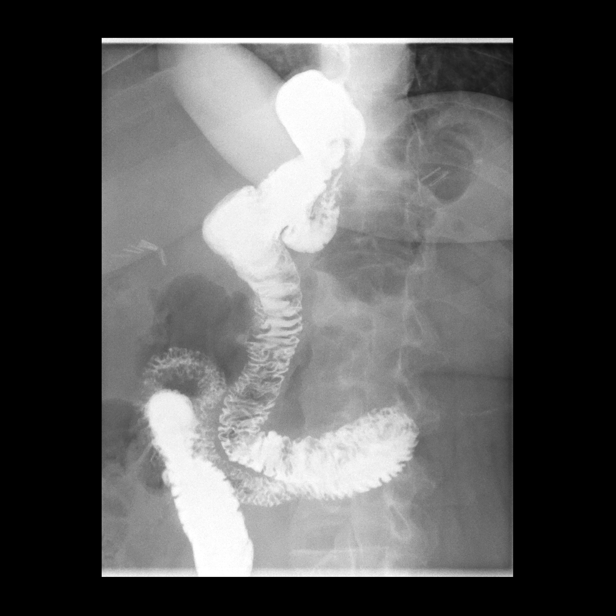
[im 9/11]
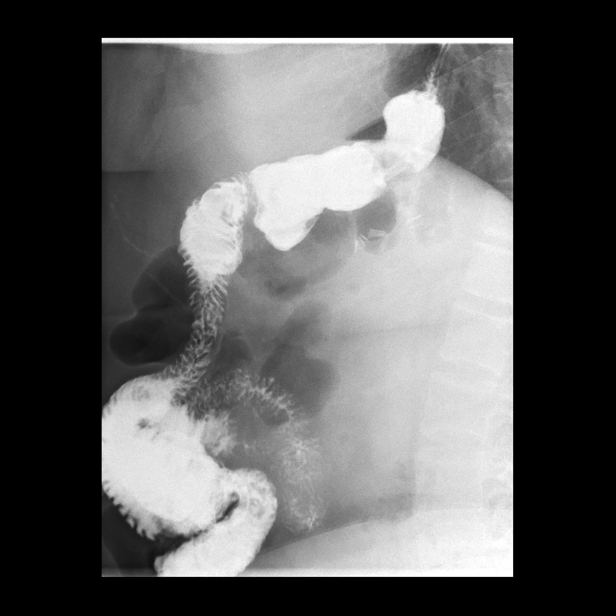
[im 11/11]
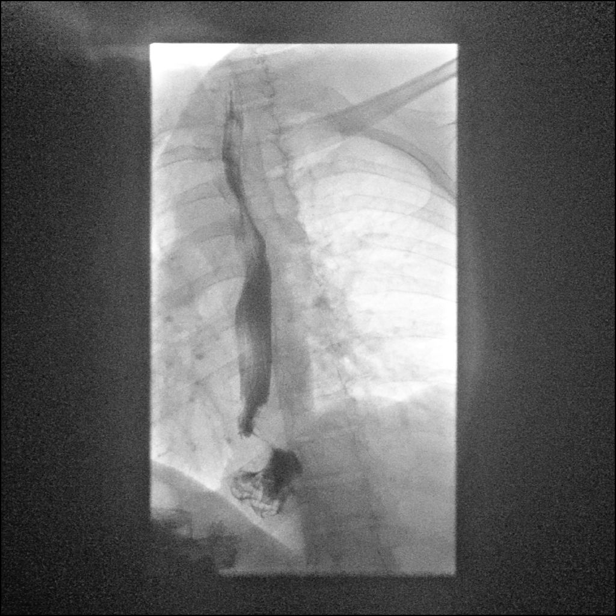

[Series 4: sequence · 2 of 42 frames shown (2 of 2)]
[frame 10/42]
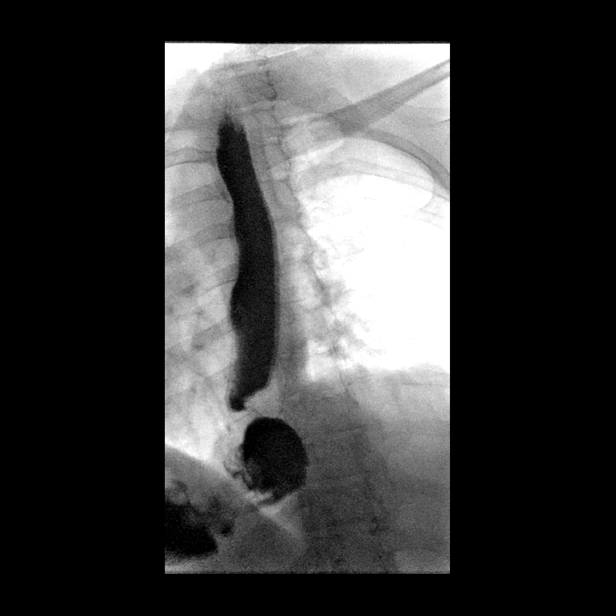
[frame 36/42]
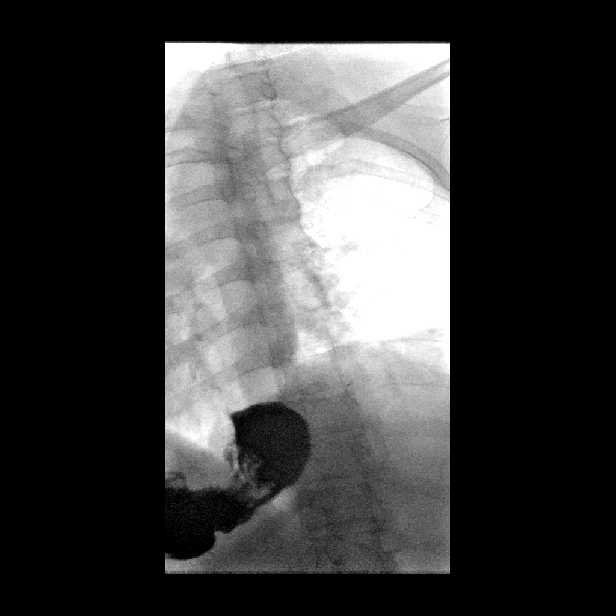

[Series 5: one shot · 0.15mm/px · 3 of 5 slices shown (3 of 3)]
[im 1/5]
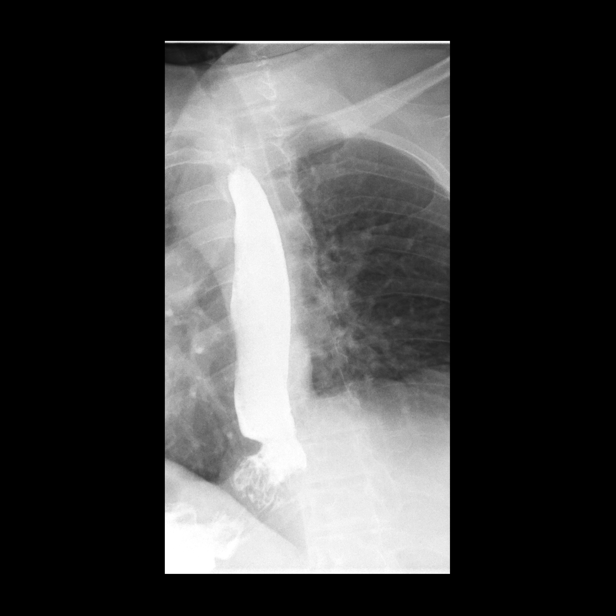
[im 3/5]
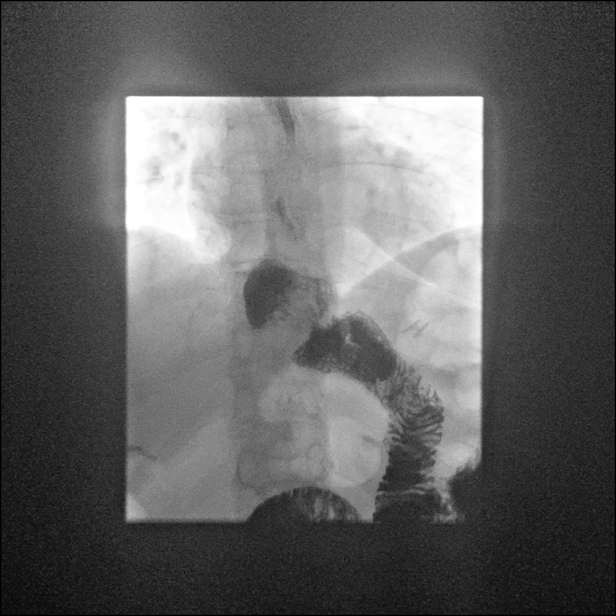
[im 5/5]
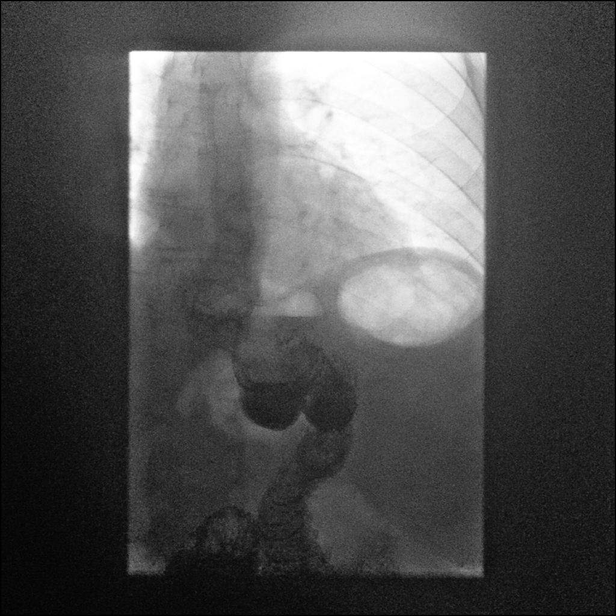

[14 of 24 positions shown; findings below may reference images not displayed]

FINDINGS: Scout radiograph demonstrates no evidence of bowel obstruction and
postsurgical changes in the upper abdomen.

The thoracic esophagus is normal in course. There is no visible
ulceration or stricture.

Normal esophageal motility. Postsurgical changes of Roux-en-Y
gastric bypass. There is a moderate size hiatal hernia. Spontaneous
gastroesophageal reflux occurred to the upper esophagus.

There is no evidence of GG fistula. Normal emptying of the small
gastric pouch through the gastrojejunostomy. Normal appearance of
the proximal jejunum. No evidence of reflux into the biliary limb.
IMPRESSION: Postsurgical changes of Roux-en-Y gastric bypass. Moderate size
hiatal hernia. Spontaneous gastroesophageal reflux occurred during
the exam to the upper esophagus.

No evidence of obstruction. Normal emptying of the small gastric
pouch through the gastrojejunostomy. Normal appearance of the
proximal jejunum.

## 2021-06-04 MED ORDER — HYOSCYAMINE SULFATE 0.125 MG PO TABS
ORAL_TABLET | ORAL | 0 refills | Status: DC
  Filled 2021-06-04: qty 30, 7d supply, fill #0
  Filled 2021-06-27: qty 30, 10d supply, fill #0

## 2021-06-05 ENCOUNTER — Other Ambulatory Visit (HOSPITAL_BASED_OUTPATIENT_CLINIC_OR_DEPARTMENT_OTHER): Payer: Self-pay

## 2021-06-06 ENCOUNTER — Other Ambulatory Visit: Payer: Self-pay

## 2021-06-06 ENCOUNTER — Ambulatory Visit (INDEPENDENT_AMBULATORY_CARE_PROVIDER_SITE_OTHER): Payer: BC Managed Care – PPO

## 2021-06-06 VITALS — BP 131/86 | HR 87 | Temp 98.0°F | Resp 16 | Ht 64.0 in | Wt 260.0 lb

## 2021-06-06 DIAGNOSIS — D485 Neoplasm of uncertain behavior of skin: Secondary | ICD-10-CM | POA: Diagnosis not present

## 2021-06-06 DIAGNOSIS — D225 Melanocytic nevi of trunk: Secondary | ICD-10-CM | POA: Diagnosis not present

## 2021-06-06 DIAGNOSIS — E860 Dehydration: Secondary | ICD-10-CM | POA: Diagnosis not present

## 2021-06-06 DIAGNOSIS — L820 Inflamed seborrheic keratosis: Secondary | ICD-10-CM | POA: Diagnosis not present

## 2021-06-06 DIAGNOSIS — D2262 Melanocytic nevi of left upper limb, including shoulder: Secondary | ICD-10-CM | POA: Diagnosis not present

## 2021-06-06 NOTE — Progress Notes (Signed)
Diagnosis: Dehydration  Provider:  Chilton Greathouse, MD  Procedure: Infusion  IV Type: Peripheral, IV Location: L Antecubital  Normal Saline, Dose: 1000 ml  Infusion Start Time: 1043  Infusion Stop Time: 1150  Post Infusion IV Care: Peripheral IV Discontinued  Discharge: Condition: Good, Destination: Home . AVS provided to patient.   Performed by:  Marilynn Rail, RN

## 2021-06-11 ENCOUNTER — Ambulatory Visit (INDEPENDENT_AMBULATORY_CARE_PROVIDER_SITE_OTHER): Payer: BC Managed Care – PPO

## 2021-06-11 ENCOUNTER — Other Ambulatory Visit: Payer: Self-pay

## 2021-06-11 VITALS — BP 124/85 | HR 89 | Temp 97.8°F | Resp 16 | Ht 64.0 in | Wt 257.4 lb

## 2021-06-11 DIAGNOSIS — J9611 Chronic respiratory failure with hypoxia: Secondary | ICD-10-CM

## 2021-06-11 DIAGNOSIS — E860 Dehydration: Secondary | ICD-10-CM | POA: Diagnosis not present

## 2021-06-11 NOTE — Progress Notes (Signed)
Diagnosis: Hydration  Provider:  Chilton Greathouse, MD  Procedure: Infusion  IV Type: Peripheral, IV Location: R Hand  Normal Saline, Dose: 1000 ml  Infusion Start Time: 1006  Infusion Stop Time: 1115  Post Infusion IV Care: Peripheral IV Discontinued  Discharge: Condition: Good, Destination: Home . AVS provided to patient.   Performed by:  Nat Math, RN

## 2021-06-13 ENCOUNTER — Telehealth: Payer: BC Managed Care – PPO | Admitting: Physician Assistant

## 2021-06-13 ENCOUNTER — Other Ambulatory Visit: Payer: Self-pay | Admitting: Family Medicine

## 2021-06-13 DIAGNOSIS — G4762 Sleep related leg cramps: Secondary | ICD-10-CM

## 2021-06-13 DIAGNOSIS — M26609 Unspecified temporomandibular joint disorder, unspecified side: Secondary | ICD-10-CM

## 2021-06-13 NOTE — Telephone Encounter (Signed)
Rx refill request approved per Dr. Corey's orders. 

## 2021-06-13 NOTE — Progress Notes (Signed)
Based on what you shared with me, I feel your condition warrants further evaluation and I recommend that you be seen in a face to face visit.  This sounds like temporomandibular joint dysfunction or arthritis, which is above the scope of what we can fully diagnose and treat via e-visit. As such you need to reach out to your PCP or be seen in person at a local Urgent care for a full examination, possible imaging, and to get proper ongoing management for this condition. It may also warrant a referral to an ENT provider or oral specialist.    NOTE: There will be NO CHARGE for this eVisit   If you are having a true medical emergency please call 911.      For an urgent face to face visit, [facility] has six urgent care centers for your convenience:     [facility] at [facility] Directions [phone] [address] [place], [place] [address]    [facility]) Get Driving Directions [phone] [address] [place], [place] [address]  [facility] Center [facility]) Get Driving Directions [phone] [address] [place],  [place]  [address]  [facility] at [facility] Get Driving Directions [phone] [address] [place], [place] [address]   [facility] Urgent Care at [facility] Get Driving Directions  [phone] [address] [place], [place] [address]   [facility] Health Urgent Care at [facility] Directions [phone] [address] [place], [place] [address]  Your MyChart E-visit questionnaire answers were reviewed by a board certified advanced clinical practitioner to complete your personal care plan based on your specific symptoms.  Thank you for using e-Visits.

## 2021-06-14 ENCOUNTER — Other Ambulatory Visit: Payer: Self-pay

## 2021-06-14 ENCOUNTER — Ambulatory Visit (INDEPENDENT_AMBULATORY_CARE_PROVIDER_SITE_OTHER): Payer: BC Managed Care – PPO

## 2021-06-14 VITALS — BP 131/84 | HR 66 | Temp 98.1°F | Resp 16 | Ht 64.0 in | Wt 260.2 lb

## 2021-06-14 DIAGNOSIS — E860 Dehydration: Secondary | ICD-10-CM

## 2021-06-14 NOTE — Progress Notes (Signed)
Diagnosis: DEHYDRATION   Provider:  Chilton Greathouse, MD  Procedure: Infusion  IV Type: Peripheral, IV Location: L Hand  Normal Saline, Dose: 1000 ml  Infusion Start Time: 0915  Infusion Stop Time: 1037  Post Infusion IV Care: Peripheral IV Discontinued  Discharge: Condition: Good, Destination: Home . AVS provided to patient.   Performed by:  Nat Math, RN

## 2021-06-18 ENCOUNTER — Other Ambulatory Visit: Payer: Self-pay | Admitting: General Surgery

## 2021-06-18 ENCOUNTER — Other Ambulatory Visit (HOSPITAL_BASED_OUTPATIENT_CLINIC_OR_DEPARTMENT_OTHER): Payer: Self-pay

## 2021-06-19 ENCOUNTER — Ambulatory Visit: Payer: BC Managed Care – PPO

## 2021-06-19 ENCOUNTER — Emergency Department (HOSPITAL_BASED_OUTPATIENT_CLINIC_OR_DEPARTMENT_OTHER)
Admission: EM | Admit: 2021-06-19 | Discharge: 2021-06-19 | Disposition: A | Discharge status: Home / Self Care | Payer: BC Managed Care – PPO | Attending: Emergency Medicine | Admitting: Emergency Medicine

## 2021-06-19 ENCOUNTER — Emergency Department (HOSPITAL_BASED_OUTPATIENT_CLINIC_OR_DEPARTMENT_OTHER): Payer: BC Managed Care – PPO

## 2021-06-19 ENCOUNTER — Other Ambulatory Visit: Payer: Self-pay

## 2021-06-19 ENCOUNTER — Encounter (HOSPITAL_BASED_OUTPATIENT_CLINIC_OR_DEPARTMENT_OTHER): Payer: Self-pay

## 2021-06-19 DIAGNOSIS — R109 Unspecified abdominal pain: Secondary | ICD-10-CM | POA: Diagnosis not present

## 2021-06-19 DIAGNOSIS — K449 Diaphragmatic hernia without obstruction or gangrene: Secondary | ICD-10-CM | POA: Diagnosis not present

## 2021-06-19 DIAGNOSIS — R1013 Epigastric pain: Secondary | ICD-10-CM

## 2021-06-19 DIAGNOSIS — Z79899 Other long term (current) drug therapy: Secondary | ICD-10-CM | POA: Diagnosis not present

## 2021-06-19 DIAGNOSIS — R1110 Vomiting, unspecified: Secondary | ICD-10-CM | POA: Diagnosis not present

## 2021-06-19 LAB — COMPREHENSIVE METABOLIC PANEL
ALT: 5 U/L (ref 0–44)
AST: 10 U/L — ABNORMAL LOW (ref 15–41)
Albumin: 4.2 g/dL (ref 3.5–5.0)
Alkaline Phosphatase: 116 U/L (ref 38–126)
Anion gap: 11 (ref 5–15)
BUN: 13 mg/dL (ref 6–20)
CO2: 24 mmol/L (ref 22–32)
Calcium: 8.8 mg/dL — ABNORMAL LOW (ref 8.9–10.3)
Chloride: 105 mmol/L (ref 98–111)
Creatinine, Ser: 0.79 mg/dL (ref 0.44–1.00)
GFR, Estimated: >60 mL/min (ref >60)
Glucose, Bld: 82 mg/dL (ref 70–99)
Potassium: 3.4 mmol/L — ABNORMAL LOW (ref 3.5–5.1)
Sodium: 140 mmol/L (ref 135–145)
Total Bilirubin: 0.3 mg/dL (ref 0.3–1.2)
Total Protein: 6.8 g/dL (ref 6.5–8.1)

## 2021-06-19 LAB — URINALYSIS, ROUTINE W REFLEX MICROSCOPIC
Bilirubin Urine: NEGATIVE
Cellular Cast, UA: 1
Glucose, UA: NEGATIVE mg/dL
Hgb urine dipstick: NEGATIVE
Ketones, ur: NEGATIVE mg/dL
Nitrite: NEGATIVE
Protein, ur: 30 mg/dL — AB
Specific Gravity, Urine: 1.025 (ref 1.005–1.030)
pH: 5.5 (ref 5.0–8.0)

## 2021-06-19 LAB — CBC
HCT: 39.1 % (ref 36.0–46.0)
Hemoglobin: 12.8 g/dL (ref 12.0–15.0)
MCH: 27.5 pg (ref 26.0–34.0)
MCHC: 32.7 g/dL (ref 30.0–36.0)
MCV: 83.9 fL (ref 80.0–100.0)
Platelets: 346 10*3/uL (ref 150–400)
RBC: 4.66 MIL/uL (ref 3.87–5.11)
RDW: 14.5 % (ref 11.5–15.5)
WBC: 11.9 10*3/uL — ABNORMAL HIGH (ref 4.0–10.5)
nRBC: 0 % (ref 0.0–0.2)

## 2021-06-19 LAB — PREGNANCY, URINE: Preg Test, Ur: NEGATIVE

## 2021-06-19 LAB — LIPASE, BLOOD: Lipase: 19 U/L (ref 11–51)

## 2021-06-19 MED ORDER — IOHEXOL 300 MG/ML  SOLN
100.0000 mL | Freq: Once | INTRAMUSCULAR | Status: AC | PRN
  Administered 2021-06-19: 100 mL via INTRAVENOUS

## 2021-06-19 MED ORDER — MORPHINE SULFATE (PF) 2 MG/ML IV SOLN
2.0000 mg | Freq: Once | INTRAVENOUS | Status: AC
  Administered 2021-06-19: 2 mg via INTRAVENOUS
  Filled 2021-06-19: qty 1

## 2021-06-19 MED ORDER — LIDOCAINE VISCOUS HCL 2 % MT SOLN
15.0000 mL | Freq: Once | OROMUCOSAL | Status: AC
Start: 2021-06-19 — End: 2021-06-19
  Administered 2021-06-19: 15 mL via ORAL
  Filled 2021-06-19: qty 15

## 2021-06-19 MED ORDER — PROMETHAZINE HCL 25 MG PO TABS: 25.0000 mg | ORAL_TABLET | Freq: Four times a day (QID) | ORAL | 0 refills | Status: DC | PRN

## 2021-06-19 MED ORDER — MORPHINE SULFATE (PF) 4 MG/ML IV SOLN
4.0000 mg | Freq: Once | INTRAVENOUS | Status: AC
Start: 2021-06-19 — End: 2021-06-19
  Administered 2021-06-19: 4 mg via INTRAVENOUS
  Filled 2021-06-19: qty 1

## 2021-06-19 MED ORDER — SODIUM CHLORIDE 0.9 % IV BOLUS
1000.0000 mL | Freq: Once | INTRAVENOUS | Status: AC
Start: 2021-06-19 — End: 2021-06-19
  Administered 2021-06-19: 1000 mL via INTRAVENOUS

## 2021-06-19 MED ORDER — SODIUM CHLORIDE 0.9 % IV SOLN
12.5000 mg | Freq: Four times a day (QID) | INTRAVENOUS | Status: DC | PRN
  Administered 2021-06-19: 12.5 mg via INTRAVENOUS
  Filled 2021-06-19: qty 0.5

## 2021-06-19 MED ORDER — HYDROCODONE-ACETAMINOPHEN 5-325 MG PO TABS: 2.0000 | ORAL_TABLET | ORAL | 0 refills | Status: DC | PRN

## 2021-06-19 MED ORDER — PROMETHAZINE HCL 25 MG/ML IJ SOLN
INTRAMUSCULAR | Status: AC
  Filled 2021-06-19: qty 1

## 2021-06-19 MED ORDER — ONDANSETRON 4 MG PO TBDP
4.0000 mg | ORAL_TABLET | Freq: Once | ORAL | Status: AC | PRN
  Administered 2021-06-19: 4 mg via ORAL
  Filled 2021-06-19: qty 1

## 2021-06-19 MED ORDER — ALUM & MAG HYDROXIDE-SIMETH 200-200-20 MG/5ML PO SUSP
30.0000 mL | Freq: Once | ORAL | Status: AC
  Administered 2021-06-19: 30 mL via ORAL
  Filled 2021-06-19: qty 30

## 2021-06-19 NOTE — ED Notes (Signed)
Pt up to bathroom w/no assist 

## 2021-06-19 NOTE — Discharge Instructions (Addendum)
Please continue using your protonix, carafate, tylenol. You can use the norco for breakthrough pain, the phenergan for breakthrough nausea.  Please follow up with your GI doctor. Return if your symptoms worsen, or you cannot keep anything on your stomach.

## 2021-06-19 NOTE — ED Provider Notes (Signed)
[facility] EMERGENCY DEPT Provider Note   CSN: [id number] Arrival date & time: 06/19/21  1142     History  Chief Complaint  Patient presents with   Emesis    [name] [name] is a 36 y.o. female with a past medical history significant for Roux-en-Y gastric bypass for weight loss, known hiatal hernia, suspected gastric ulcer who presents for worsening epigastric abdominal pain, burning.  Patient reports that she is currently being followed by gastroenterology, has a known hernia, and has a scheduled upper endoscopy for suspected ulcer.  Patient reports that she takes Pepcid twice daily, as well as Carafate, and has minimal improvement of her burning upper abdominal pain.  Patient reports that she is now having intractable nausea and vomiting for the last 2 days.  Patient denies any blood in her vomit.  Patient reports that she does have loose stools, denies hematochezia, or dark stools.  Patient denies dysuria, hematuria, vaginal discharge, vaginal bleeding.  Patient denies any chest pain, shortness of breath.   Emesis Associated symptoms: abdominal pain       Home Medications Prior to Admission medications   Medication Sig Start Date End Date Taking? Authorizing Provider  HYDROcodone-acetaminophen (NORCO/VICODIN) 5-325 MG tablet Take 2 tablets by mouth every 4 (four) hours as needed. 06/19/21  Yes Jaysha Lasure H, PA-C  promethazine (PHENERGAN) 25 MG tablet Take 1 tablet (25 mg total) by mouth every 6 (six) hours as needed for nausea or vomiting. 06/19/21  Yes Dalphine Cowie H, PA-C  acetaminophen (TYLENOL) 500 MG tablet Take 2 tablets (1,000 mg total) by mouth every 6 (six) hours as needed for moderate pain. 02/13/19   Domenic Moras, PA-C  baclofen (LIORESAL) 10 MG tablet TAKE 1 TABLET BY MOUTH THREE TIMES A DAY AS NEEDED FOR MUSCLE SPASMS 03/27/21   Gregor Hams, MD  busPIRone (BUSPAR) 15 MG tablet Take 15 mg by mouth 3 (three) times daily. 11/01/19   [provider]  CALCIUM PO Take 1 tablet by mouth daily.    [provider]  clonazePAM (KLONOPIN) 1 MG tablet Take 0.5 tablets (0.5 mg total) by mouth 2 (two) times daily as needed for anxiety. 03/27/21   Leamon Arnt, MD  clotrimazole (MYCELEX) 10 MG troche Take 10 mg by mouth 5 (five) times daily. 09/24/20   [provider]  cyanocobalamin (,VITAMIN B-12,) 1000 MCG/ML injection Inject 1 vial per week for 3 weeks, then 1 vial per month thereafter 06/21/20   Leamon Arnt, MD  cyclobenzaprine (FLEXERIL) 10 MG tablet Take 1 tablet (10 mg total) by mouth 3 (three) times daily as needed for muscle spasms. 01/31/21   Kirsteins, Luanna Salk, MD  diclofenac Sodium (VOLTAREN) 1 % GEL Apply 4 g topically 4 (four) times daily. Patient taking differently: Apply 4 g topically 4 (four) times daily as needed (pain). 05/13/19   Palumbo, April, MD  docusate sodium (COLACE) 100 MG capsule Take 1 capsule (100 mg total) by mouth 2 (two) times daily. 05/06/20   Rolm Bookbinder, MD  hydrOXYzine (VISTARIL) 50 MG capsule Take 50 mg by mouth every 6 (six) hours as needed. 12/26/20   [provider]  hyoscyamine (LEVSIN) 0.125 MG tablet Take 1 tablet (0.125 mg total) by mouth every 6 (six) hours as needed for Cramping for up to 10 days 06/04/21     lamoTRIgine (LAMICTAL) 150 MG tablet Take 300 mg by mouth daily.  04/10/20   [provider]  lithium carbonate (LITHOBID) 300 MG CR  tablet Take 600 mg by mouth at bedtime.    [provider]  Magnesium 500 MG TABS Take 1 tablet by mouth daily.    [provider]  metFORMIN (GLUCOPHAGE) 500 MG tablet Take 500 mg by mouth 2 (two) times daily. 11/01/19   [provider]  nortriptyline (PAMELOR) 10 MG capsule TAKE 1 CAPSULE BY MOUTH AT BEDTIME. 04/04/21   Kirsteins, Luanna Salk, MD  ondansetron (ZOFRAN) 4 MG tablet Take 1 tablet (4 mg total) by mouth every 6 (six) hours. 09/22/20   Isla Pence, MD  pantoprazole  (PROTONIX) 20 MG tablet Take 1 tablet (20 mg total) by mouth daily. 12/14/20   Leamon Arnt, MD  QUEtiapine (SEROQUEL) 400 MG tablet Take 400 mg by mouth at bedtime.  08/24/19   [provider]  QUEtiapine (SEROQUEL) 50 MG tablet Take by mouth. 06/27/20   [provider]  SYRINGE-NEEDLE, DISP, 3 ML 25G X 1" 3 ML MISC Use 1 needle per application once weekly 06/21/20   Leamon Arnt, MD  tizanidine (ZANAFLEX) 2 MG capsule TAKE 2 CAPSULES (4 MG TOTAL) BY MOUTH 3 (THREE) TIMES DAILY AS NEEDED FOR MUSCLE SPASMS. 06/13/21   Gregor Hams, MD  valACYclovir (VALTREX) 1000 MG tablet Take 1 tablet (1,000 mg total) by mouth 3 (three) times daily. Patient taking differently: Take 1,000 mg by mouth 3 (three) times daily as needed (cold sores). 05/25/19   Chevis Pretty, FNP      Allergies    Nsaids, Other, and Zolpidem    Review of Systems   Review of Systems  Gastrointestinal:  Positive for abdominal pain, nausea and vomiting.  All other systems reviewed and are negative.  Physical Exam Updated Vital Signs BP 128/86 (BP Location: Right Arm)    Pulse 97    Temp 97.7 F (36.5 C)    Resp 18    Ht 5\' 4"  (1.626 m)    Wt 117.9 kg    SpO2 100%    BMI 44.63 kg/m  Physical Exam Vitals and nursing note reviewed.  Constitutional:      General: She is not in acute distress.    Appearance: Normal appearance. She is obese.  HENT:     Head: Normocephalic and atraumatic.  Eyes:     General:        Right eye: No discharge.        Left eye: No discharge.  Cardiovascular:     Rate and Rhythm: Normal rate and regular rhythm.     Heart sounds: No murmur heard.   No friction rub. No gallop.  Pulmonary:     Effort: Pulmonary effort is normal.     Breath sounds: Normal breath sounds.  Abdominal:     General: Bowel sounds are normal.     Palpations: Abdomen is soft.     Comments: Patient with significant tenderness to palpation in the epigastric region, no rebound, rigidity, guarding.   There is no distention of the abdomen.,  There is minimal to no tenderness throughout the rest of the abdomen.  Patient has no flank pain, CVA tenderness.  She has no suprapubic tenderness.  Skin:    General: Skin is warm and dry.     Capillary Refill: Capillary refill takes less than 2 seconds.  Neurological:     Mental Status: She is alert and oriented to person, place, and time.  Psychiatric:        Mood and Affect: Mood normal.  Behavior: Behavior normal.    ED Results / Procedures / Treatments   Labs (all labs ordered are listed, but only abnormal results are displayed) Labs Reviewed  COMPREHENSIVE METABOLIC PANEL - Abnormal; Notable for the following components:      Result Value   Potassium 3.4 (*)    Calcium 8.8 (*)    AST 10 (*)    All other components within normal limits  CBC - Abnormal; Notable for the following components:   WBC 11.9 (*)    All other components within normal limits  URINALYSIS, ROUTINE W REFLEX MICROSCOPIC - Abnormal; Notable for the following components:   APPearance HAZY (*)    Protein, ur 30 (*)    Leukocytes,Ua MODERATE (*)    Bacteria, UA MANY (*)    All other components within normal limits  LIPASE, BLOOD  PREGNANCY, URINE    EKG None  Radiology CT ABDOMEN PELVIS W CONTRAST  Result Date: 06/19/2021 CLINICAL DATA:  Nausea, vomiting and abdominal pain. EXAM: CT ABDOMEN AND PELVIS WITH CONTRAST TECHNIQUE: Multidetector CT imaging of the abdomen and pelvis was performed using the standard protocol following bolus administration of intravenous contrast. CONTRAST:  12mL OMNIPAQUE IOHEXOL 300 MG/ML  SOLN COMPARISON:  CT abdomen and pelvis 05/25/2021. Upper GI study 06/04/2021. FINDINGS: Lower chest: No acute abnormality. Hepatobiliary: No focal liver abnormality is seen. Status post cholecystectomy. Gallbladder surgically absent. Common bile duct measures 10 mm, unchanged from prior. No focal liver lesions are identified. Pancreas:  Unremarkable. No pancreatic ductal dilatation or surrounding inflammatory changes. Spleen: Normal in size without focal abnormality. Adrenals/Urinary Tract: Adrenal glands are unremarkable. Kidneys are normal, without renal calculi, focal lesion, or hydronephrosis. Bladder is unremarkable. Stomach/Bowel: Patient is status post gastric bypass. Small hiatal hernia is unchanged. Appendix appears normal. No evidence of bowel wall thickening, distention, or inflammatory changes. Vascular/Lymphatic: No significant vascular findings are present. No enlarged abdominal or pelvic lymph nodes. Reproductive: Uterus and bilateral adnexa are unremarkable. Other: No abdominal wall hernia or abnormality. No abdominopelvic ascites. Musculoskeletal: No acute fracture. A 1 cm rounded sclerotic lesion left iliac is unchanged favored as IMPRESSION: 1. No acute localizing process in the or pelvis. 2. Bypass changes. 3. Stable small hernia. 4. Cholecystectomy.  Stable common bile dilatation. Electronically Signed   By: Ronney Asters M.D.   On: 06/19/2021 18:42    Procedures Procedures    Medications Ordered in ED Medications  promethazine (PHENERGAN) 12.5 mg in sodium chloride 0.9 % 50 mL IVPB (12.5 mg Intravenous New Bag/Given 06/19/21 1625)  promethazine (PHENERGAN) 25 MG/ML injection (has no administration in time range)  ondansetron (ZOFRAN-ODT) disintegrating tablet 4 mg (4 mg Oral Given 06/19/21 1208)  sodium chloride 0.9 % bolus 1,000 mL (0 mLs Intravenous Stopped 06/19/21 1731)  morphine 2 MG/ML injection 2 mg (2 mg Intravenous Given 06/19/21 1622)  alum & mag hydroxide-simeth (MAALOX/MYLANTA) 200-200-20 MG/5ML suspension 30 mL (30 mLs Oral Given 06/19/21 1627)    And  lidocaine (XYLOCAINE) 2 % viscous mouth solution 15 mL (15 mLs Oral Given 06/19/21 1627)  morphine 4 MG/ML injection 4 mg (4 mg Intravenous Given 06/19/21 1753)  iohexol (OMNIPAQUE) 300 MG/ML solution 100 mL (100 mLs Intravenous Contrast Given 06/19/21 1822)     ED Course/ Medical Decision Making/ A&P                           Medical Decision Making  I discussed this case with my attending physician who  cosigned this note including patient's presenting symptoms, physical exam, and planned diagnostics and interventions. Attending physician stated agreement with plan or made changes to plan which were implemented.   This patient presents to the ED for concern of epigastric abdominal pain, this involves an extensive number of treatment options, and is a complaint that carries with it a high risk of complications and morbidity. The emergent differential diagnosis includes, but is not limited to, gastritis, gastroenteritis, mesenteric ischemia, perforated gastric ulcer, pancreatitis, cholecystitis, choledocholithiasis, cholangitis.   Co morbidities that complicate the patient evaluation:   Physical exam performed. The pertinent findings include: Tenderness to palpation of the epigastric region.  He does not have any rebound, rigidity, guarding, however she is quite focally tender.  Lab Tests: I Ordered, and personally interpreted labs.  The pertinent results include: Overall unremarkable CMP with mild hypokalemia, mild hypocalcemia, I do not believe these are contributing to patient's overall condition.  I do recommend that she take some potassium supplementation as she is also on several QT prolonging medications for nausea.  Patient has a mild white count of 11.9.  Her urinalysis has moderate leukocytes, and some bacteria, but also has elevated squamous cells.  Patient has no dysuria, or other urinary symptoms, or tenderness to palpation of the suprapubic region, no believe this represents a true bacterial infection, and I will hold off on treating at this time.   Imaging Studies ordered: I ordered imaging studies including CT abdomen pelvis I independently visualized and interpreted imaging which showed known hiatal hernia, postsurgical changes of  cholecystectomy, no evidence of acute perforation, or peritonitis at this time. I agree with the radiologist interpretation.   Medicines ordered and prescription drug management: I ordered medication including Zofran, Phenergan, morphine, Maalox/Mylanta, viscous lidocaine for epigastric abdominal pain and presumed gastric origin given known hiatal hernia, suspected gastric ulcer. Reevaluation of the patient after these medicines showed that the patient improved I have reviewed the patients home medicines and have made adjustments as needed  Critical Interventions: There fluids, pain medication, and medications for patient's stomach she was able to pass a p.o. challenge.  We will discharge her with oral Phenergan, and a short course of pain medication.  Based on her unremarkable work-up today, afebrile presentation, and ability to pass p.o. challenge I do believe that she is experiencing sequelae of known hiatal hernia, as well as suspected gastric ulcers.  I do recommend that she follow-up closely with her gastroenterologist for further evaluation and for her planned upper endoscopy series.  Patient discharged in stable condition at this time, return precautions given.    Final Clinical Impression(s) / ED Diagnoses Final diagnoses:  Epigastric abdominal pain  Hiatal hernia    Rx / DC Orders ED Discharge Orders          Ordered    promethazine (PHENERGAN) 25 MG tablet  Every 6 hours PRN        06/19/21 1915    HYDROcodone-acetaminophen (NORCO/VICODIN) 5-325 MG tablet  Every 4 hours PRN        06/19/21 1915              Dorien Chihuahua 06/19/21 1929    Truddie Hidden, MD 06/19/21 2300

## 2021-06-19 NOTE — ED Triage Notes (Signed)
Pt states she has an hiatal hernia and suspected peptic ulcer. Pt started having increased vomiting yesterday and has vomited x5 in last 24 hours.

## 2021-06-21 ENCOUNTER — Other Ambulatory Visit: Payer: Self-pay

## 2021-06-21 ENCOUNTER — Ambulatory Visit (INDEPENDENT_AMBULATORY_CARE_PROVIDER_SITE_OTHER): Payer: BC Managed Care – PPO

## 2021-06-21 ENCOUNTER — Other Ambulatory Visit: Payer: Self-pay | Admitting: Family Medicine

## 2021-06-21 VITALS — BP 143/95 | HR 83 | Temp 97.7°F | Resp 16 | Ht 64.0 in | Wt 263.2 lb

## 2021-06-21 DIAGNOSIS — E860 Dehydration: Secondary | ICD-10-CM | POA: Diagnosis not present

## 2021-06-21 NOTE — Progress Notes (Signed)
Diagnosis: Hydration  Provider:  Chilton Greathouse, MD  Procedure: Infusion  IV Type: Peripheral, IV Location: Right wrist  Normal Saline, Dose: 2L  Infusion Start Time: 1129  Infusion Stop Time: 1355  Post Infusion IV Care: Peripheral IV Discontinued  Discharge: Condition: Good, Destination: Home . AVS provided to patient.   Performed by:  Nat Math, RN

## 2021-06-25 ENCOUNTER — Encounter (HOSPITAL_COMMUNITY): Payer: Self-pay | Admitting: Gastroenterology

## 2021-06-25 NOTE — Progress Notes (Signed)
Attempted to obtain medical history via telephone, unable to reach at this time. Unable to leave voicemail to return pre surgical testing department's phone call,due to mailbox full.  

## 2021-06-26 ENCOUNTER — Ambulatory Visit: Payer: BC Managed Care – PPO

## 2021-06-27 ENCOUNTER — Other Ambulatory Visit (HOSPITAL_BASED_OUTPATIENT_CLINIC_OR_DEPARTMENT_OTHER): Payer: Self-pay

## 2021-06-28 ENCOUNTER — Ambulatory Visit: Payer: BC Managed Care – PPO

## 2021-07-01 ENCOUNTER — Other Ambulatory Visit: Payer: Self-pay

## 2021-07-01 ENCOUNTER — Ambulatory Visit (INDEPENDENT_AMBULATORY_CARE_PROVIDER_SITE_OTHER): Payer: BC Managed Care – PPO

## 2021-07-01 VITALS — BP 115/83 | HR 81 | Temp 98.4°F | Resp 16 | Ht 64.0 in | Wt 256.8 lb

## 2021-07-01 DIAGNOSIS — E860 Dehydration: Secondary | ICD-10-CM

## 2021-07-01 MED ORDER — SODIUM CHLORIDE 0.9 % IV BOLUS
2000.0000 mL | Freq: Once | INTRAVENOUS | Status: AC
  Administered 2021-07-01: 2000 mL via INTRAVENOUS
  Filled 2021-07-01: qty 2000

## 2021-07-01 NOTE — Progress Notes (Signed)
Diagnosis: dehydration   Provider:  Chilton Greathouse, MD  Procedure: Infusion  IV Type: Peripheral, IV Location: L Hand  Normal Saline, Dose:  Infusion Start Time: 0819  Infusion Stop Time: 1020  Post Infusion IV Care: Peripheral IV Discontinued  Discharge: Condition: Good, Destination: Home . AVS provided to patient.   Performed by:  Nat Math, RN

## 2021-07-02 ENCOUNTER — Encounter
Payer: BC Managed Care – PPO | Attending: Physical Medicine & Rehabilitation | Admitting: Physical Medicine & Rehabilitation

## 2021-07-02 ENCOUNTER — Encounter: Payer: Self-pay | Admitting: Physical Medicine & Rehabilitation

## 2021-07-02 ENCOUNTER — Other Ambulatory Visit: Payer: Self-pay

## 2021-07-02 VITALS — BP 124/81 | HR 99 | Temp 98.2°F | Ht 64.0 in | Wt 261.0 lb

## 2021-07-02 DIAGNOSIS — M25561 Pain in right knee: Secondary | ICD-10-CM | POA: Diagnosis not present

## 2021-07-02 NOTE — Progress Notes (Signed)
Knee injection, suprapatellar bursa Right (with ultrasound guidance)  Indication:Right Knee pain not relieved by medication management and other conservative care.  Informed consent was obtained after describing risks and benefits of the procedure with the patient, this includes bleeding, bruising, infection and medication side effects. The patient wishes to proceed and has given written consent. The patient was placed in a recumbent position. The medial aspect of the knee was marked and prepped with Betadine and alcohol. It was then entered with a 25-gauge 1-1/2 inch needle and 1 mL of 1% lidocaine was injected into the skin and subcutaneous tissue. Then another 22 78mm ECHO bloc needle was inserted into the knee joint. After negative draw back for blood, a solution containing one ML of 6mg  per mL betamethasone and 3 mL of 1% lidocaine were injected. The patient tolerated the procedure well. Post procedure instructions were given.

## 2021-07-02 NOTE — Patient Instructions (Signed)
Knee Injection ?A knee injection is a procedure to get medicine into your knee joint to relieve the pain, swelling, and stiffness of arthritis. Your health care provider uses a needle to inject medicine, which may also help to lubricate and cushion your knee joint. You may need more than one injection. ?Tell a health care provider about: ?Any allergies you have. ?All medicines you are taking, including vitamins, herbs, eye drops, creams, and over-the-counter medicines. ?Any problems you or family members have had with anesthetic medicines. ?Any blood disorders you have. ?Any surgeries you have had. ?Any medical conditions you have. ?Whether you are pregnant or may be pregnant. ?What are the risks? ?Generally, this is a safe procedure. However, problems may occur, including: ?Infection. ?Bleeding. ?Symptoms that get worse. ?Damage to the area around your knee. ?Allergic reaction to any of the medicines. ?Skin reactions from repeated injections. ?What happens before the procedure? ?Ask your health care provider about: ?Changing or stopping your regular medicines. This is especially important if you are taking diabetes medicines or blood thinners. ?Taking medicines such as aspirin and ibuprofen. These medicines can thin your blood. Do not take these medicines unless your health care provider tells you to take them. ?Taking over-the-counter medicines, vitamins, herbs, and supplements. ?Plan to have a responsible adult take you home from the hospital or clinic. ?What happens during the procedure? ? ?You will sit or lie down in a position for your knee to be treated. ?The skin over your kneecap will be cleaned with a germ-killing soap. ?You will be given a medicine that numbs the area (local anesthetic). You may feel some stinging. ?The medicine will be injected into your knee. The needle is carefully placed between your kneecap and your knee. The medicine is injected into the joint space. ?The needle will be removed at  the end of the procedure. ?A bandage (dressing) may be placed over the injection site. ?The procedure may vary among health care providers and hospitals. ?What can I expect after the procedure? ?Your blood pressure, heart rate, breathing rate, and blood oxygen level will be monitored until you leave the hospital or clinic. ?You may have to move your knee through its full range of motion. This helps to get all the medicine into your joint space. ?You will be watched to make sure that you do not have a reaction to the injected medicine. ?You may feel more pain, swelling, and warmth than you did before the injection. This reaction may last about 1-2 days. ?Follow these instructions at home: ?Medicines ?Take over-the-counter and prescription medicines only as told by your health care provider. ?Ask your health care provider if the medicine prescribed to you requires you to avoid driving or using machinery. ?Do not take medicines such as aspirin and ibuprofen unless your health care provider tells you to take them. ?Injection site care ?Follow instructions from your health care provider about: ?How to take care of your puncture site. ?When and how you should change your dressing. ?When you should remove your dressing. ?Check your injection area every day for signs of infection. Check for: ?More redness, swelling, or pain after 2 days. ?Fluid or blood. ?Pus or a bad smell. ?Warmth. ?Managing pain, stiffness, and swelling ? ?If directed, put ice on the injection area. To do this: ?Put ice in a plastic bag. ?Place a towel between your skin and the bag. ?Leave the ice on for 20 minutes, 2-3 times per day. ?Remove the ice if your skin turns bright red.   This is very important. If you cannot feel pain, heat, or cold, you have a greater risk of damage to the area. ?Do not apply heat to your knee. ?Raise (elevate) the injection area above the level of your heart while you are sitting or lying down. ?General instructions ?If you  were given a dressing, keep it dry until your health care provider says it can be removed. Ask your health care provider when you can start showering or bathing. ?Avoid strenuous activities for as long as directed by your health care provider. Ask your health care provider when you can return to your normal activities. ?Keep all follow-up visits. This is important. You may need more injections. ?Contact a health care provider if you have: ?A fever. ?Warmth in your injection area. ?Fluid, blood, or pus coming from your injection site. ?Symptoms at your injection site that last longer than 2 days after your procedure. ?Get help right away if: ?Your knee turns very red. ?Your knee becomes very swollen. ?Your knee is in severe pain. ?Summary ?A knee injection is a procedure to get medicine into your knee joint to relieve the pain, swelling, and stiffness of arthritis. ?A needle is carefully placed between your kneecap and your knee to inject medicine into the joint space. ?Before the procedure, ask your health care provider about changing or stopping your regular medicines, especially if you are taking diabetes medicines or blood thinners. ?Contact your health care provider if you have any problems or questions after your procedure. ?This information is not intended to replace advice given to you by your health care provider. Make sure you discuss any questions you have with your health care provider. ?Document Revised: 11/16/2019 Document Reviewed: 11/16/2019 ?Elsevier Patient Education ? 2022 [employer] ? ?

## 2021-07-03 NOTE — Anesthesia Preprocedure Evaluation (Addendum)
Anesthesia Evaluation  Patient identified by MRN, date of birth, ID band Patient awake    Reviewed: NPO status , Patient's Chart, lab work & pertinent test results  History of Anesthesia Complications (+) PONV and history of anesthetic complications  Airway Mallampati: II  TM Distance: >3 FB Neck ROM: Full    Dental  (+) Teeth Intact, Dental Advisory Given   Pulmonary  02/14/2019 SARS coronavirus NEG   breath sounds clear to auscultation       Cardiovascular hypertension,  Rhythm:Regular Rate:Normal     Neuro/Psych  Headaches, PSYCHIATRIC DISORDERS Anxiety Depression Bipolar Disorder    GI/Hepatic GERD  Medicated,  Endo/Other  Hypothyroidism Morbid obesityPCOS  Renal/GU      Musculoskeletal   Abdominal (+) + obese,   Peds  Hematology  (+) anemia ,   Anesthesia Other Findings   Reproductive/Obstetrics                            Anesthesia Physical  Anesthesia Plan  ASA: 3  Anesthesia Plan: MAC   Post-op Pain Management: Minimal or no pain anticipated   Induction:   PONV Risk Score and Plan: 3 and Ondansetron, Midazolam and Propofol infusion  Airway Management Planned: Natural Airway  Additional Equipment: None  Intra-op Plan:   Post-operative Plan:   Informed Consent: I have reviewed the patients History and Physical, chart, labs and discussed the procedure including the risks, benefits and alternatives for the proposed anesthesia with the patient or authorized representative who has indicated his/her understanding and acceptance.     Dental advisory given  Plan Discussed with: CRNA and Anesthesiologist  Anesthesia Plan Comments:        Anesthesia Quick Evaluation

## 2021-07-04 ENCOUNTER — Ambulatory Visit (HOSPITAL_COMMUNITY): Payer: BC Managed Care – PPO | Admitting: Anesthesiology

## 2021-07-04 ENCOUNTER — Encounter (HOSPITAL_COMMUNITY): Payer: Self-pay | Admitting: Gastroenterology

## 2021-07-04 ENCOUNTER — Encounter (HOSPITAL_COMMUNITY)
Admission: RE | Disposition: A | Discharge status: Home / Self Care | Payer: Self-pay | Source: Ambulatory Visit | Attending: Gastroenterology

## 2021-07-04 ENCOUNTER — Other Ambulatory Visit: Payer: Self-pay

## 2021-07-04 ENCOUNTER — Ambulatory Visit (HOSPITAL_COMMUNITY)
Admission: RE | Admit: 2021-07-04 | Discharge: 2021-07-04 | Disposition: A | Discharge status: Home / Self Care | Payer: BC Managed Care – PPO | Source: Ambulatory Visit | Attending: Gastroenterology | Admitting: Gastroenterology

## 2021-07-04 DIAGNOSIS — Z6841 Body Mass Index (BMI) 40.0 and over, adult: Secondary | ICD-10-CM | POA: Insufficient documentation

## 2021-07-04 DIAGNOSIS — E039 Hypothyroidism, unspecified: Secondary | ICD-10-CM | POA: Insufficient documentation

## 2021-07-04 DIAGNOSIS — K219 Gastro-esophageal reflux disease without esophagitis: Secondary | ICD-10-CM | POA: Insufficient documentation

## 2021-07-04 DIAGNOSIS — D649 Anemia, unspecified: Secondary | ICD-10-CM | POA: Diagnosis not present

## 2021-07-04 DIAGNOSIS — I10 Essential (primary) hypertension: Secondary | ICD-10-CM | POA: Insufficient documentation

## 2021-07-04 DIAGNOSIS — R1013 Epigastric pain: Secondary | ICD-10-CM | POA: Insufficient documentation

## 2021-07-04 DIAGNOSIS — K2970 Gastritis, unspecified, without bleeding: Secondary | ICD-10-CM | POA: Diagnosis not present

## 2021-07-04 DIAGNOSIS — F3181 Bipolar II disorder: Secondary | ICD-10-CM | POA: Diagnosis not present

## 2021-07-04 DIAGNOSIS — R110 Nausea: Secondary | ICD-10-CM | POA: Diagnosis not present

## 2021-07-04 DIAGNOSIS — K449 Diaphragmatic hernia without obstruction or gangrene: Secondary | ICD-10-CM | POA: Diagnosis not present

## 2021-07-04 DIAGNOSIS — K2930 Chronic superficial gastritis without bleeding: Secondary | ICD-10-CM | POA: Diagnosis not present

## 2021-07-04 DIAGNOSIS — Z9884 Bariatric surgery status: Secondary | ICD-10-CM | POA: Diagnosis not present

## 2021-07-04 DIAGNOSIS — F419 Anxiety disorder, unspecified: Secondary | ICD-10-CM | POA: Insufficient documentation

## 2021-07-04 HISTORY — PX: ESOPHAGOGASTRODUODENOSCOPY (EGD) WITH PROPOFOL: SHX5813

## 2021-07-04 HISTORY — PX: BIOPSY: SHX5522

## 2021-07-04 SURGERY — ESOPHAGOGASTRODUODENOSCOPY (EGD) WITH PROPOFOL
Anesthesia: Monitor Anesthesia Care

## 2021-07-04 MED ORDER — LACTATED RINGERS IV SOLN: INTRAVENOUS | Status: DC

## 2021-07-04 MED ORDER — DEXMEDETOMIDINE (PRECEDEX) IN NS 20 MCG/5ML (4 MCG/ML) IV SYRINGE
PREFILLED_SYRINGE | INTRAVENOUS | Status: DC | PRN
  Administered 2021-07-04: 20 ug via INTRAVENOUS

## 2021-07-04 MED ORDER — PROPOFOL 500 MG/50ML IV EMUL
INTRAVENOUS | Status: DC | PRN
Start: 2021-07-04 — End: 2021-07-04
  Administered 2021-07-04: 100 ug/kg/min via INTRAVENOUS

## 2021-07-04 MED ORDER — MIDAZOLAM HCL 2 MG/2ML IJ SOLN
INTRAMUSCULAR | Status: AC
  Filled 2021-07-04: qty 2

## 2021-07-04 MED ORDER — LIDOCAINE 2% (20 MG/ML) 5 ML SYRINGE
INTRAMUSCULAR | Status: DC | PRN
  Administered 2021-07-04: 40 mg via INTRAVENOUS

## 2021-07-04 MED ORDER — ONDANSETRON HCL 4 MG/2ML IJ SOLN
INTRAMUSCULAR | Status: DC | PRN
  Administered 2021-07-04: 4 mg via INTRAVENOUS

## 2021-07-04 MED ORDER — MIDAZOLAM HCL 5 MG/5ML IJ SOLN
INTRAMUSCULAR | Status: DC | PRN
  Administered 2021-07-04: 2 mg via INTRAVENOUS

## 2021-07-04 MED ORDER — PROPOFOL 10 MG/ML IV BOLUS
INTRAVENOUS | Status: DC | PRN
Start: 2021-07-04 — End: 2021-07-04
  Administered 2021-07-04: 30 mg via INTRAVENOUS
  Administered 2021-07-04: 50 mg via INTRAVENOUS
  Administered 2021-07-04: 100 mg via INTRAVENOUS

## 2021-07-04 SURGICAL SUPPLY — 15 items

## 2021-07-04 NOTE — Op Note (Signed)
[facility] Patient Name: [name] [name] Procedure Date: 07/04/2021 MRN: [id number] Attending MD: Milus Banister , MD Date of Birth: 1985-07-27 CSN: [id number] Age: 36 Admit Type: Outpatient Procedure:                Upper GI endoscopy Indications:              Epigastric abdominal pain, chest pains, constant                            nausea. s/p Roux en Y bariatric bypass many years                            ago Providers:                Milus Banister, MD, Jeanella Cara, RN,                            Tyna Jaksch Technician, Despina Pole,                            Technician, Cleda Daub, CRNA Referring MD:             Greer Pickerel, MD Medicines:                Monitored Anesthesia Care Complications:            No immediate complications. Estimated blood loss:                            None. Estimated Blood Loss:     Estimated blood loss: none. Procedure:                Pre-Anesthesia Assessment:                           - Prior to the procedure, a History and Physical                            was performed, and patient medications and                            allergies were reviewed. The patient's tolerance of                            previous anesthesia was also reviewed. The risks                            and benefits of the procedure and the sedation                            options and risks were discussed with the patient.                            All questions were answered, and informed consent                            was obtained.  Prior Anticoagulants: The patient has                            taken no previous anticoagulant or antiplatelet                            agents. ASA Grade Assessment: IV - A patient with                            severe systemic disease that is a constant threat                            to life. After reviewing the risks and benefits,                            the patient was deemed  in satisfactory condition to                            undergo the procedure.                           After obtaining informed consent, the endoscope was                            passed under direct vision. Throughout the                            procedure, the patient's blood pressure, pulse, and                            oxygen saturations were monitored continuously. The                            [id number] Olympus endoscope was introduced                            through the mouth, and advanced to the second part                            of duodenum. The upper GI endoscopy was                            accomplished without difficulty. The patient                            tolerated the procedure well. 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      Mild inflammation of her gastric remnant pouch, biospied to check for H.       pylori.      2-3cm hiatal hernia causing typical foreshortened and tortuous esophagus.      The exam was otherwise without abnormality. 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                           - Mild inflammation of her gastric remnant pouch,                            biospied to check for H. pylori.                           - 2-3cm hiatal hernia causing typical foreshortened                            and tortuous esophagus. Moderate Sedation:      Not Applicable - Patient had care per Anesthesia. Recommendation:           - Await pathology results.                           - Change your protonix dosing: you should take one                            pill about 20-30 min before your breakfast meal and                            another pill 20-30 min before your dinner meal. Procedure Code(s):        --- Professional ---                           [phone],  Esophagogastroduodenoscopy, flexible,                            transoral; with biopsy, single or multiple Diagnosis Code(s):        --- Professional ---                           K29.70, Gastritis, unspecified, without bleeding                           R10.13, Epigastric pain CPT copyright 2019 [employer]. All rights reserved. The codes documented in this report are preliminary and upon coder review may  be revised to meet current compliance requirements. Milus Banister, MD 07/04/2021 11:12:17 AM This report has been signed electronically. Number of Addenda: 0

## 2021-07-04 NOTE — Discharge Instructions (Signed)
YOU HAD AN ENDOSCOPIC PROCEDURE TODAY: Refer to the procedure report and other information in the discharge instructions given to you for any specific questions about what was found during the examination. If this information does not answer your questions, please call [place] office at [phone] to clarify.  ° °YOU SHOULD EXPECT: Some feelings of bloating in the abdomen. Passage of more gas than usual. Walking can help get rid of the air that was put into your GI tract during the procedure and reduce the bloating. If you had a lower endoscopy (such as a colonoscopy or flexible sigmoidoscopy) you may notice spotting of blood in your stool or on the toilet paper. Some abdominal soreness may be present for a day or two, also. ° °DIET: Your first meal following the procedure should be a light meal and then it is ok to progress to your normal diet. A half-sandwich or bowl of soup is an example of a good first meal. Heavy or fried foods are harder to digest and may make you feel nauseous or bloated. Drink plenty of fluids but you should avoid alcoholic beverages for 24 hours. If you had a esophageal dilation, please see attached instructions for diet.   ° °ACTIVITY: Your care partner should take you home directly after the procedure. You should plan to take it easy, moving slowly for the rest of the day. You can resume normal activity the day after the procedure however YOU SHOULD NOT DRIVE, use power tools, machinery or perform tasks that involve climbing or major physical exertion for 24 hours (because of the sedation medicines used during the test).  ° °SYMPTOMS TO REPORT IMMEDIATELY: °A gastroenterologist can be reached at any hour. Please call [phone]  for any of the following symptoms:  °Following lower endoscopy (colonoscopy, flexible sigmoidoscopy) °Excessive amounts of blood in the stool  °Significant tenderness, worsening of abdominal pains  °Swelling of the abdomen that is new, acute  °Fever of 100° or  higher  °Following upper endoscopy (EGD, EUS, ERCP, esophageal dilation) °Vomiting of blood or coffee ground material  °New, significant abdominal pain  °New, significant chest pain or pain under the shoulder blades  °Painful or persistently difficult swallowing  °New shortness of breath  °Black, tarry-looking or red, bloody stools ° °FOLLOW UP:  °If any biopsies were taken you will be contacted by phone or by letter within the next 1-3 weeks. Call [phone]  if you have not heard about the biopsies in 3 weeks.  °Please also call with any specific questions about appointments or follow up tests. ° °

## 2021-07-04 NOTE — Anesthesia Postprocedure Evaluation (Signed)
Anesthesia Post Note  Patient: [name] [name]  Procedure(s) Performed: ESOPHAGOGASTRODUODENOSCOPY (EGD) WITH PROPOFOL BIOPSY     Patient location during evaluation: PACU Anesthesia Type: MAC Level of consciousness: awake and alert Pain management: pain level controlled Vital Signs Assessment: post-procedure vital signs reviewed and stable Respiratory status: spontaneous breathing and respiratory function stable Cardiovascular status: stable Postop Assessment: no apparent nausea or vomiting Anesthetic complications: no   No notable events documented.  Last Vitals:  Vitals:   07/04/21 1116 07/04/21 1126  BP: (!) 148/92 (!) 155/88  Pulse: 89 97  Resp: 17 (!) 22  Temp:    SpO2: 98% 100%    Last Pain:  Vitals:   07/04/21 1126  TempSrc:   PainSc: 0-No pain                 Yesica Kemler DANIEL

## 2021-07-04 NOTE — H&P (Signed)
HPI: This is a woman with post bariatric surgery anatomy, abd pains, chest pressure, constant nausea for a very long time.  Not losing weight   ROS: complete GI ROS as described in HPI, all other review negative.  Constitutional:  No unintentional weight loss   Past Medical History:  Diagnosis Date   ADD (attention deficit disorder)    Anemia    Anxiety    B12 deficiency    Back pain    Bilateral swelling of feet    Bipolar 2 disorder (HCC) 09/19/2019   Constipation    DEPRESSION    Elevated cholesterol    Family history of colon cancer 05/18/19   Father, 53s; deceased.    Fatty liver    Fibromyalgia    Gallbladder problem    GERD (gastroesophageal reflux disease)    Heartburn    History of stomach ulcers    Hypertension    Hypothyroidism    Hx of, normalized TSH during pregnancy 2011   Infertility, female    Lactose intolerance    Migraines    Morbid obesity (HCC)    s/p RY 08/2012 - start weight 290 pounds   Nephrolithiasis    Palpitations    Panic disorder    PCOS (polycystic ovarian syndrome)    PONV (postoperative nausea and vomiting)    Pregnancy induced hypertension    Shortness of breath    Vitamin D deficiency     Past Surgical History:  Procedure Laterality Date   Quintella Reichert OSTEOTOMY Left 06/24/2017   Procedure: Quintella Reichert OSTEOTOMY;  Surgeon: [name], DPM;  Location: [facility];  Service: Podiatry;  Laterality: Left;   BUNIONECTOMY Left 06/24/2017   Procedure: Anthoney Harada;  Surgeon: [name], DPM;  Location: [facility];  Service: Podiatry;  Laterality: Left;   CESAREAN SECTION     x 2   CHOLECYSTECTOMY N/A 02/15/2019   Procedure: LAPAROSCOPIC CHOLECYSTECTOMY WITH INTRAOPERATIVE CHOLANGIOGRAM;  Surgeon: Andria Meuse, MD;  Location: [facility];  Service: General;  Laterality: N/A;   COLON RESECTION N/A 05/04/2020   Procedure: DIAGNOSTIC LAPAROSCOPY, RESECTION OF CANDYCANE, UPPER ENDOSCOPY;  Surgeon:  Berna Bue, MD;  Location: [facility];  Service: General;  Laterality: N/A;   FOOT SURGERY     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   Procedure: LAPAROSCOPIC ROUX-EN-Y GASTRIC;  Surgeon: Atilano Ina, MD;  Location: [facility];  Service: General;  Laterality: N/A;  laparoscopic roux-en-y gastric bypass   LITHOTRIPSY Left    TONSILLECTOMY     TUBAL LIGATION     UPPER GI ENDOSCOPY  08/24/2012   Procedure: UPPER GI ENDOSCOPY;  Surgeon: Atilano Ina, MD;  Location: [facility];  Service: General;;   WISDOM TOOTH EXTRACTION      Current Facility-Administered Medications  Medication Dose Route Frequency Provider Last Rate Last Admin   lactated ringers infusion   Intravenous Continuous Rachael Fee, MD 20 mL/hr at 07/04/21 1006 New Bag at 07/04/21 1006    Allergies as of 05/06/2021 - Review Complete 04/05/2021  Allergen Reaction Noted   Nsaids Other (See Comments) 04/29/2018   Zolpidem Other (See Comments) 02/02/2019    Family History  Problem Relation Age of Onset   Hyperlipidemia Father    Hypertension Father    Kidney disease Father    Colon cancer Father 58   Cancer Father    Depression Father    Anxiety disorder Father    Bipolar disorder Father    Sleep apnea Father  Obesity Father    Hypertension Mother    Depression Mother    Anxiety disorder Mother    Obesity Mother    Hypertension Maternal Grandmother    Uterine cancer Maternal Grandmother 93   Diabetes Maternal Grandfather     Social History   Socioeconomic History   Marital status: Married    Spouse name: Not on file   Number of children: Not on file   Years of education: Not on file   Highest education level: Not on file  Occupational History   Occupation: stay at home mom  Tobacco Use   Smoking status: Never   Smokeless tobacco: Never  Vaping Use   Vaping Use: Never used  Substance and Sexual Activity   Alcohol use: Not Currently    Comment: rare   Drug use: No   Sexual activity: Yes    Birth  control/protection: Surgical    Comment: tubal ligation  Other Topics Concern   Not on file  Social History Narrative   Married, lives with spouse and dtr born 07/2009. Employed as Associate Professor at [employer].   Social Determinants of Health   Financial Resource Strain: Not on file  Food Insecurity: Not on file  Transportation Needs: Not on file  Physical Activity: Not on file  Stress: Not on file  Social Connections: Not on file  Intimate Partner Violence: Not on file     Physical Exam: BP (!) 146/88    Pulse 91    Temp (!) 97.3 F (36.3 C) (Temporal)    Resp 12    Ht 5\' 4"  (1.626 m)    Wt 118.4 kg    SpO2 99%    BMI 44.80 kg/m  Constitutional: generally well-appearing Psychiatric: alert and oriented x3 Abdomen: soft, nontender, nondistended, no obvious ascites, no peritoneal signs, normal bowel sounds No peripheral edema noted in lower extremities  Assessment and plan: 36 y.o. female with epigastric pressure, abd pain, nausea   Egd today  Please see the "Patient Instructions" section for addition details about the plan.  30, MD [facility] 07/04/2021, 10:37 AM

## 2021-07-04 NOTE — Transfer of Care (Signed)
Immediate Anesthesia Transfer of Care Note  Patient: [name] [name]  Procedure(s) Performed: ESOPHAGOGASTRODUODENOSCOPY (EGD) WITH PROPOFOL BIOPSY  Patient Location: PACU  Anesthesia Type:MAC  Level of Consciousness: awake, alert , oriented and patient cooperative  Airway & Oxygen Therapy: Patient Spontanous Breathing and Patient connected to face mask oxygen  Post-op Assessment: Report given to RN and Post -op Vital signs reviewed and stable  Post vital signs: Reviewed and stable  Last Vitals:  Vitals Value Taken Time  BP    Temp    Pulse 98 07/04/21 1106  Resp 21 07/04/21 1106  SpO2 99 % 07/04/21 1106  Vitals shown include unvalidated device data.  Last Pain:  Vitals:   07/04/21 0943  TempSrc: Temporal  PainSc: 0-No pain         Complications: No notable events documented.

## 2021-07-05 ENCOUNTER — Encounter (HOSPITAL_COMMUNITY): Payer: Self-pay | Admitting: Gastroenterology

## 2021-07-05 ENCOUNTER — Other Ambulatory Visit: Payer: Self-pay

## 2021-07-05 ENCOUNTER — Ambulatory Visit: Payer: Self-pay

## 2021-07-05 LAB — SURGICAL PATHOLOGY

## 2021-07-08 ENCOUNTER — Other Ambulatory Visit: Payer: Self-pay

## 2021-07-08 ENCOUNTER — Ambulatory Visit (INDEPENDENT_AMBULATORY_CARE_PROVIDER_SITE_OTHER): Payer: BC Managed Care – PPO

## 2021-07-08 VITALS — BP 125/84 | HR 95 | Temp 98.2°F | Resp 16 | Ht 64.0 in | Wt 254.0 lb

## 2021-07-08 DIAGNOSIS — E860 Dehydration: Secondary | ICD-10-CM | POA: Diagnosis not present

## 2021-07-08 MED ORDER — SODIUM CHLORIDE 0.9 % IV BOLUS
2000.0000 mL | Freq: Once | INTRAVENOUS | Status: AC
  Administered 2021-07-08: 2000 mL via INTRAVENOUS
  Filled 2021-07-08: qty 2000

## 2021-07-08 MED ORDER — SODIUM CHLORIDE 0.9 % IV SOLN: INTRAVENOUS | Status: AC

## 2021-07-08 NOTE — Progress Notes (Signed)
Diagnosis: Hydration  Provider:  Chilton Greathouse, MD  Procedure: Infusion  IV Type: Peripheral, IV Location: left wrist  Normal Saline, Dose:  Infusion Start Time: 0820  Infusion Stop Time: 1035  Post Infusion IV Care: Peripheral IV Discontinued  Discharge: Condition: Good, Destination: Home . AVS provided to patient.   Performed by:  Nat Math, RN

## 2021-07-08 NOTE — Addendum Note (Signed)
Addended by: Marin Shutter E on: 07/08/2021 11:06 AM   Modules accepted: Orders

## 2021-07-11 ENCOUNTER — Ambulatory Visit: Payer: BC Managed Care – PPO

## 2021-07-11 ENCOUNTER — Telehealth: Payer: Self-pay | Admitting: Family Medicine

## 2021-07-11 ENCOUNTER — Encounter: Payer: Self-pay | Admitting: Family

## 2021-07-11 ENCOUNTER — Telehealth (INDEPENDENT_AMBULATORY_CARE_PROVIDER_SITE_OTHER): Payer: BC Managed Care – PPO | Admitting: Family

## 2021-07-11 ENCOUNTER — Ambulatory Visit: Payer: Self-pay

## 2021-07-11 ENCOUNTER — Encounter: Payer: Self-pay | Admitting: Family Medicine

## 2021-07-11 ENCOUNTER — Other Ambulatory Visit: Payer: Self-pay

## 2021-07-11 VITALS — Ht 64.0 in | Wt 254.0 lb

## 2021-07-11 DIAGNOSIS — F99 Mental disorder, not otherwise specified: Secondary | ICD-10-CM

## 2021-07-11 DIAGNOSIS — F5105 Insomnia due to other mental disorder: Secondary | ICD-10-CM

## 2021-07-11 NOTE — Telephone Encounter (Signed)
Pt states she is having problems sleeping and dealing with her depression. She was asking for a virtual appt with someone today but I didn't feel comfortable scheduling with anyone but Dr Mardelle Matte due to the medications she was on. Please Advise.

## 2021-07-11 NOTE — Assessment & Plan Note (Signed)
pt on many antipsychotic, sedating meds that are not helping her currently after suffering a death in her family. Has tried to take 1 full pill, 1mg , of Klonopin at bedtime which is not helping. Reports she has not spoken to her therapist in several months, & I advised she call their office and request a visit due to worsening sx. Taking low dose Pamelor for fibromyalgia, started a few months ago. Advised this med is also used for insomnia and she can increase her dose to 20mg  and see if this helps.  Advised her to NOT take the Klonopin with this, and f/u with PCP if continued concerns.

## 2021-07-11 NOTE — Progress Notes (Signed)
MyChart Video Visit    Virtual Visit via Video Note   This visit type was conducted due to national recommendations for restrictions regarding the COVID-19 Pandemic (e.g. social distancing) in an effort to limit this patient's exposure and mitigate transmission in our community. This patient is at least at moderate risk for complications without adequate follow up. This format is felt to be most appropriate for this patient at this time. Physical exam was limited by quality of the video and audio technology used for the visit. CMA was able to get the patient set up on a video visit.  Patient location: Home. Patient and provider in visit Provider location: Office  I discussed the limitations of evaluation and management by telemedicine and the availability of in person appointments. The patient expressed understanding and agreed to proceed.  Visit Date: 07/11/2021  Today's healthcare provider: Dulce Sellar, NP     Subjective:    Patient ID: [name] [name], female    DOB: 12/08/1985, 36 y.o.   MRN: [id number]  Chief Complaint  Patient presents with   Insomnia    Death in the family. Worsened 1 weeks ago.   Depression    Medications are not helping.    HPI: Depression: Patient complains of sleep disturbance and increased depression due to death in the family.   She has the following symptoms: insomnia, sadness.  Onset of symptoms was approximately 1 week ago, She denies current suicidal and homicidal ideation.  Possible organic causes contributing are: none.  Risk factors: negative life event death in the family.  Previous treatment includes benzodiazepines Klonopin, BuSpar, neuroleptics Lamictal, and Lithium, Seroquel, Hydroxyzine, Pamelor and individual therapy.  pt reports none of her meds are helping her right now.  Past Medical History:  Diagnosis Date   ADD (attention deficit disorder)    Anemia    Anxiety    B12 deficiency    Back pain    Bilateral  swelling of feet    Bipolar 2 disorder (HCC) 09/19/2019   Constipation    DEPRESSION    Elevated cholesterol    Family history of colon cancer 06-01-19   Father, 18s; deceased.    Fatty liver    Fibromyalgia    Gallbladder problem    GERD (gastroesophageal reflux disease)    Heartburn    History of stomach ulcers    Hypertension    Hypothyroidism    Hx of, normalized TSH during pregnancy 2011   Infertility, female    Lactose intolerance    Migraines    Morbid obesity (HCC)    s/p RY 08/2012 - start weight 290 pounds   Nephrolithiasis    Palpitations    Panic disorder    PCOS (polycystic ovarian syndrome)    PONV (postoperative nausea and vomiting)    Pregnancy induced hypertension    Shortness of breath    Vitamin D deficiency     Past Surgical History:  Procedure Laterality Date   Quintella Reichert OSTEOTOMY Left 06/24/2017   Procedure: Quintella Reichert OSTEOTOMY;  Surgeon: [name], DPM;  Location: [facility];  Service: Podiatry;  Laterality: Left;   BIOPSY  07/04/2021   Procedure: BIOPSY;  Surgeon: Rachael Fee, MD;  Location: [facility];  Service: Endoscopy;;   BUNIONECTOMY Left 06/24/2017   Procedure: Anthoney Harada;  Surgeon: [name], DPM;  Location: [facility];  Service: Podiatry;  Laterality: Left;   CESAREAN SECTION     x 2   CHOLECYSTECTOMY  N/A 02/15/2019   Procedure: LAPAROSCOPIC CHOLECYSTECTOMY WITH INTRAOPERATIVE CHOLANGIOGRAM;  Surgeon: Andria MeuseWhite, Christopher M, MD;  Location: [facility];  Service: General;  Laterality: N/A;   COLON RESECTION N/A 05/04/2020   Procedure: DIAGNOSTIC LAPAROSCOPY, RESECTION OF CANDYCANE, UPPER ENDOSCOPY;  Surgeon: Berna Bueonnor, Chelsea A, MD;  Location: [facility];  Service: General;  Laterality: N/A;   ESOPHAGOGASTRODUODENOSCOPY (EGD) WITH PROPOFOL N/A 07/04/2021   Procedure: ESOPHAGOGASTRODUODENOSCOPY (EGD) WITH PROPOFOL;  Surgeon: Rachael FeeJacobs, Daniel P, MD;  [facility]: [facility];  Service: Endoscopy;  Laterality:  N/A;   FOOT SURGERY     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   Procedure: LAPAROSCOPIC ROUX-EN-Y GASTRIC;  Surgeon: Atilano InaEric M Wilson, MD;  Location: [facility];  Service: General;  Laterality: N/A;  laparoscopic roux-en-y gastric bypass   LITHOTRIPSY Left    TONSILLECTOMY     TUBAL LIGATION     UPPER GI ENDOSCOPY  08/24/2012   Procedure: UPPER GI ENDOSCOPY;  Surgeon: Atilano InaEric M Wilson, MD;  Location: [facility];  Service: General;;   WISDOM TOOTH EXTRACTION      Outpatient Medications Prior to Visit  Medication Sig Dispense Refill   acetaminophen (TYLENOL) 500 MG tablet Take 2 tablets (1,000 mg total) by mouth every 6 (six) hours as needed for moderate pain. 30 tablet 0   baclofen (LIORESAL) 10 MG tablet TAKE 1 TABLET BY MOUTH THREE TIMES A DAY AS NEEDED FOR MUSCLE SPASMS 270 tablet 1   busPIRone (BUSPAR) 15 MG tablet Take 15 mg by mouth 3 (three) times daily.     CALCIUM PO Take 1 tablet by mouth daily.     clonazePAM (KLONOPIN) 1 MG tablet Take 0.5 tablets (0.5 mg total) by mouth 2 (two) times daily as needed for anxiety. 60 tablet 1   clotrimazole (MYCELEX) 10 MG troche Take 10 mg by mouth 5 (five) times daily.     cyanocobalamin (,VITAMIN B-12,) 1000 MCG/ML injection Inject 1 vial per week for 3 weeks, then 1 vial per month thereafter 6 mL 3   hydrOXYzine (VISTARIL) 50 MG capsule Take 50 mg by mouth every 6 (six) hours as needed.     hyoscyamine (LEVSIN) 0.125 MG tablet Take 1 tablet (0.125 mg total) by mouth every 6 (six) hours as needed for Cramping for up to 10 days 30 tablet 0   lamoTRIgine (LAMICTAL) 150 MG tablet Take 300 mg by mouth daily.      lithium carbonate (LITHOBID) 300 MG CR tablet Take 600 mg by mouth at bedtime.     Magnesium 500 MG TABS Take 1 tablet by mouth daily.     metFORMIN (GLUCOPHAGE) 500 MG tablet Take 500 mg by mouth 2 (two) times daily.     nortriptyline (PAMELOR) 10 MG capsule TAKE 1 CAPSULE BY MOUTH AT BEDTIME. 90 capsule 1   ondansetron (ZOFRAN) 4 MG tablet Take 1 tablet (4  mg total) by mouth every 6 (six) hours. 12 tablet 0   pantoprazole (PROTONIX) 20 MG tablet TAKE 1 TABLET BY MOUTH EVERY DAY 90 tablet 1   promethazine (PHENERGAN) 25 MG tablet Take 1 tablet (25 mg total) by mouth every 6 (six) hours as needed for nausea or vomiting. 30 tablet 0   QUEtiapine (SEROQUEL) 400 MG tablet Take 400 mg by mouth at bedtime.      QUEtiapine (SEROQUEL) 50 MG tablet Take by mouth.     SYRINGE-NEEDLE, DISP, 3 ML 25G X 1" 3 ML MISC Use 1 needle per application once weekly 12 each 0   tizanidine (ZANAFLEX) 2 MG  capsule TAKE 2 CAPSULES (4 MG TOTAL) BY MOUTH 3 (THREE) TIMES DAILY AS NEEDED FOR MUSCLE SPASMS. 540 capsule 1   valACYclovir (VALTREX) 1000 MG tablet Take 1 tablet (1,000 mg total) by mouth 3 (three) times daily. (Patient taking differently: Take 1,000 mg by mouth 3 (three) times daily as needed (cold sores).) 21 tablet 0   diclofenac Sodium (VOLTAREN) 1 % GEL Apply 4 g topically 4 (four) times daily. (Patient taking differently: Apply 4 g topically 4 (four) times daily as needed (pain).) 100 g 0   cyclobenzaprine (FLEXERIL) 10 MG tablet Take 1 tablet (10 mg total) by mouth 3 (three) times daily as needed for muscle spasms. 45 tablet 0   docusate sodium (COLACE) 100 MG capsule Take 1 capsule (100 mg total) by mouth 2 (two) times daily. 30 capsule 2   HYDROcodone-acetaminophen (NORCO/VICODIN) 5-325 MG tablet Take 2 tablets by mouth every 4 (four) hours as needed. 10 tablet 0   No facility-administered medications prior to visit.    Allergies  Allergen Reactions   Nsaids Other (See Comments)    Gastric bypass "gastric bypass" Other reaction(s): Other (See Comments) Gastric bypass "gastric bypass" Gastric bypass "gastric bypass"    Other Other (See Comments)    Ambien "sleep walking" Other reaction(s): Other (See Comments)   Zolpidem Other (See Comments)    Sleep walking  Sleep walking  Other reaction(s): Other (See Comments), Other (See Comments) "sleep  walking" Sleep walking  Sleep walking  Sleep walking  "sleep walking" Sleep walking  Sleep walking  Sleep walking  "sleep walking" Sleep walking  Sleep walking  Sleep walking          Objective:     Physical Exam Vitals and nursing note reviewed.  Constitutional:      General: She is not in acute distress.    Appearance: Normal appearance.  HENT:     Head: Normocephalic.  Pulmonary:     Effort: No respiratory distress.  Musculoskeletal:     Cervical back: Normal range of motion.  Skin:    General: Skin is dry.     Coloration: Skin is not pale.  Neurological:     Mental Status: She is alert and oriented to person, place, and time.  Psychiatric:        Mood and Affect: Mood normal.   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 Has tried to take 1 full pill, 1mg , of Klonopin at bedtime which is not helping. Reports she has not spoken to her therapist in several months, & I advised she call their office and request a visit due to worsening sx. Taking low dose Pamelor for fibromyalgia, started a few months ago. Advised this med is also used for insomnia and she can increase her dose to 20mg  and see if this helps.  Advised her to NOT take the Klonopin with this, and f/u with PCP if continued concerns.  I discussed the assessment and treatment plan with the patient. The patient was provided an opportunity to ask questions and all were answered. The patient agreed with the plan and demonstrated an understanding of the instructions.   The patient was  advised to call back or seek an in-person evaluation if the symptoms worsen or if the condition fails to improve as anticipated.  I provided 24 minutes of face-to-face time during this encounter.   Dulce SellarHudnell, Earnestine Shipp, NP [facility] [phone] (phone) [phone] (fax)  [facility]

## 2021-07-12 ENCOUNTER — Other Ambulatory Visit: Payer: Self-pay

## 2021-07-12 ENCOUNTER — Ambulatory Visit
Admission: RE | Admit: 2021-07-12 | Discharge: 2021-07-12 | Disposition: A | Discharge status: Home / Self Care | Payer: BC Managed Care – PPO | Source: Ambulatory Visit | Attending: Internal Medicine | Admitting: Internal Medicine

## 2021-07-12 VITALS — BP 104/70 | HR 93 | Temp 98.2°F | Resp 18 | Ht 64.0 in | Wt 254.0 lb

## 2021-07-12 DIAGNOSIS — M26629 Arthralgia of temporomandibular joint, unspecified side: Secondary | ICD-10-CM | POA: Diagnosis not present

## 2021-07-12 MED ORDER — METHYLPREDNISOLONE SODIUM SUCC 40 MG IJ SOLR
40.0000 mg | Freq: Once | INTRAMUSCULAR | Status: AC
  Administered 2021-07-12: 40 mg via INTRAMUSCULAR

## 2021-07-12 MED ORDER — METHYLPREDNISOLONE SODIUM SUCC 40 MG IJ SOLR: 40.0000 mg | Freq: Once | INTRAMUSCULAR | Status: DC

## 2021-07-12 MED ORDER — METHOCARBAMOL 500 MG PO TABS: 500.0000 mg | ORAL_TABLET | Freq: Every evening | ORAL | 0 refills | Status: DC | PRN

## 2021-07-12 NOTE — Discharge Instructions (Signed)
Follow up with a oral surgeon

## 2021-07-12 NOTE — ED Triage Notes (Signed)
Pt here with C/O left ear pain for 2 months. Told prior appointments it isn't infected. Never saw ENT.

## 2021-07-12 NOTE — ED Provider Notes (Signed)
[facility]    CSN: [id number] Arrival date & time: 07/12/21  1735      History   Chief Complaint Chief Complaint  Patient presents with   Otalgia    HPI [name] [name] is a 36 y.o. female who presents with chronic ear pain x 2 months, but there is never an infection. She is in town visiting and thought she would come to be checked. Denies URI. Her L ear feels a little stuffy. Her husband admits she grinds her teeth and she was told by a dentist she has signs of this and recommended her to her a mouth guard OTC, but causes her to gag since they are too big. Has not seen ENT or an oral surgeon.     Past Medical History:  Diagnosis Date   ADD (attention deficit disorder)    Anemia    Anxiety    B12 deficiency    Back pain    Bilateral swelling of feet    Bipolar 2 disorder ([place]) 09/19/2019   Constipation    DEPRESSION    Elevated cholesterol    Family history of colon cancer 05-20-19   Father, 69s; deceased.    Fatty liver    Fibromyalgia    Gallbladder problem    GERD (gastroesophageal reflux disease)    Heartburn    History of stomach ulcers    Hypertension    Hypothyroidism    Hx of, normalized TSH during pregnancy 2011   Infertility, female    Lactose intolerance    Migraines    Morbid obesity ([place])    s/p RY 08/2012 - start weight 290 pounds   Nephrolithiasis    Palpitations    Panic disorder    PCOS (polycystic ovarian syndrome)    PONV (postoperative nausea and vomiting)    Pregnancy induced hypertension    Shortness of breath    Vitamin D deficiency     Patient Active Problem List   Diagnosis Date Noted   Dehydration 05/31/2021   Intussusception of small intestine ([place]) 05/03/2020   Plantar fasciitis 10/31/2019   Bipolar 2 disorder ([place]) 09/19/2019   Fibromyalgia 09/09/2019   Ischial bursitis of left side 07/19/2019   Vitamin B12 deficiency 06/01/2019   Vitamin D deficiency 06/01/2019   Panic disorder 06/01/2019   Family  history of colon cancer 05/20/2019   Chronic constipation May 20, 2019   Major depression, recurrent, chronic ([place]) 05-20-19   Insomnia due to other mental disorder 03/13/2019   Pain in both lower extremities 11/21/2018   Nocturnal leg cramps 10/26/2018   Nephrolithiasis    Reduced libido, due to Lexapro 07/09/2017   Binge eating disorder 04/23/2017   Acquired iron deficiency anemia due to decreased absorption 02/22/2015   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    Past Surgical History:  Procedure Laterality Date   Barbie Banner OSTEOTOMY Left 06/24/2017   Procedure: Barbie Banner OSTEOTOMY;  Surgeon: Trula Slade, DPM;  Location: [place];  Service: Podiatry;  Laterality: Left;   BIOPSY  07/04/2021   Procedure: BIOPSY;  Surgeon: Milus Banister, MD;  Location: [facility];  Service: Endoscopy;;   BUNIONECTOMY Left 06/24/2017   Procedure: Annye English;  Surgeon: Trula Slade, DPM;  Location: [place];  Service: Podiatry;  Laterality: Left;   CESAREAN SECTION     x 2   CHOLECYSTECTOMY N/A 02/15/2019   Procedure: LAPAROSCOPIC CHOLECYSTECTOMY WITH INTRAOPERATIVE CHOLANGIOGRAM;  Surgeon: Ileana Roup, MD;  Location[facility];  Service: General;  Laterality: N/A;   COLON RESECTION N/A 05/04/2020   Procedure: DIAGNOSTIC LAPAROSCOPY, RESECTION OF CANDYCANE, UPPER ENDOSCOPY;  Surgeon: Clovis Riley, MD;  [facility];  Service: General;  Laterality: N/A;   ESOPHAGOGASTRODUODENOSCOPY (EGD) WITH PROPOFOL N/A 07/04/2021   Procedure: ESOPHAGOGASTRODUODENOSCOPY (EGD) WITH PROPOFOL;  Surgeon: Milus Banister, MD;  [facility];  Service: Endoscopy;  Laterality: N/A;   FOOT SURGERY     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   Procedure: LAPAROSCOPIC ROUX-EN-Y GASTRIC;  Surgeon: Gayland Curry, MD;  [facility];  Service: General;  Laterality: N/A;  laparoscopic roux-en-y gastric bypass   LITHOTRIPSY Left    TONSILLECTOMY     TUBAL  LIGATION     UPPER GI ENDOSCOPY  08/24/2012   Procedure: UPPER GI ENDOSCOPY;  Surgeon: Gayland Curry, MD;  [facility];  Service: General;;   WISDOM TOOTH EXTRACTION      OB History     Gravida  2   Para  2   Term  2   Preterm  0   AB  0   Living  2      SAB  0   IAB  0   Ectopic  0   Multiple  0   Live Births  2            Home Medications    Prior to Admission medications   Medication Sig Start Date End Date Taking? Authorizing Provider  acetaminophen (TYLENOL) 500 MG tablet Take 2 tablets (1,000 mg total) by mouth every 6 (six) hours as needed for moderate pain. 02/13/19  Yes Domenic Moras, PA-C  busPIRone (BUSPAR) 15 MG tablet Take 15 mg by mouth 3 (three) times daily. 11/01/19  Yes [provider]  CALCIUM PO Take 1 tablet by mouth daily.   Yes [provider]  clonazePAM (KLONOPIN) 1 MG tablet Take 0.5 tablets (0.5 mg total) by mouth 2 (two) times daily as needed for anxiety. 03/27/21  Yes Leamon Arnt, MD  clotrimazole (MYCELEX) 10 MG troche Take 10 mg by mouth 5 (five) times daily. 09/24/20  Yes [provider]  cyanocobalamin (,VITAMIN B-12,) 1000 MCG/ML injection Inject 1 vial per week for 3 weeks, then 1 vial per month thereafter 06/21/20  Yes Leamon Arnt, MD  hydrOXYzine (VISTARIL) 50 MG capsule Take 50 mg by mouth every 6 (six) hours as needed. 12/26/20  Yes [provider]  hyoscyamine (LEVSIN) 0.125 MG tablet Take 1 tablet (0.125 mg total) by mouth every 6 (six) hours as needed for Cramping for up to 10 days 06/04/21  Yes   lamoTRIgine (LAMICTAL) 150 MG tablet Take 300 mg by mouth daily.  04/10/20  Yes [provider]  lithium carbonate (LITHOBID) 300 MG CR tablet Take 600 mg by mouth at bedtime.   Yes [provider]  Magnesium 500 MG TABS Take 1 tablet by mouth daily.   Yes [provider]  metFORMIN (GLUCOPHAGE) 500 MG tablet Take 500 mg by mouth 2 (two) times daily. 11/01/19  Yes  [provider]  methocarbamol (ROBAXIN) 500 MG tablet Take 1 tablet (500 mg total) by mouth at bedtime as needed for muscle spasms. 07/12/21  Yes Rodriguez-Southworth, Sunday Spillers, PA-C  nortriptyline (PAMELOR) 10 MG capsule TAKE 1 CAPSULE BY MOUTH AT BEDTIME. 04/04/21  Yes Kirsteins, Luanna Salk, MD  ondansetron (ZOFRAN) 4 MG tablet Take 1 tablet (4 mg total) by mouth every 6 (six) hours. 09/22/20  Yes Isla Pence,  MD  pantoprazole (PROTONIX) 20 MG tablet TAKE 1 TABLET BY MOUTH EVERY DAY 06/21/21  Yes Leamon Arnt, MD  promethazine (PHENERGAN) 25 MG tablet Take 1 tablet (25 mg total) by mouth every 6 (six) hours as needed for nausea or vomiting. 06/19/21  Yes Prosperi, Christian H, PA-C  QUEtiapine (SEROQUEL) 400 MG tablet Take 400 mg by mouth at bedtime.  08/24/19  Yes [provider]  QUEtiapine (SEROQUEL) 50 MG tablet Take by mouth. 06/27/20  Yes [provider]  SYRINGE-NEEDLE, DISP, 3 ML 25G X 1" 3 ML MISC Use 1 needle per application once weekly 06/21/20  Yes Leamon Arnt, MD  diclofenac Sodium (VOLTAREN) 1 % GEL Apply 4 g topically 4 (four) times daily. Patient taking differently: Apply 4 g topically 4 (four) times daily as needed (pain). 05/13/19   Palumbo, April, MD    Family History Family History  Problem Relation Age of Onset   Hyperlipidemia Father    Hypertension Father    Kidney disease Father    Colon cancer Father 3   Cancer Father    Depression Father    Anxiety disorder Father    Bipolar disorder Father    Sleep apnea Father    Obesity Father    Hypertension Mother    Depression Mother    Anxiety disorder Mother    Obesity Mother    Hypertension Maternal Grandmother    Uterine cancer Maternal Grandmother 76   Diabetes Maternal Grandfather     Social History Social History   Tobacco Use   Smoking status: Never   Smokeless tobacco: Never  Vaping Use   Vaping Use: Never used  Substance Use Topics   Alcohol use: Not Currently     Comment: rare   Drug use: No     Allergies   Nsaids, Other, and Zolpidem   Review of Systems Review of Systems  Constitutional:  Negative for fever.  HENT:  Positive for ear pain. Negative for congestion, ear discharge and facial swelling.        Her L ear feels stuffy  Skin:  Negative for rash.    Physical Exam Triage Vital Signs ED Triage Vitals  Enc Vitals Group     BP 07/12/21 1746 104/70     Pulse Rate 07/12/21 1746 93     Resp 07/12/21 1746 18     Temp 07/12/21 1746 98.2 F (36.8 C)     Temp src --      SpO2 07/12/21 1746 100 %     Weight 07/12/21 1745 254 lb (115.2 kg)     Height 07/12/21 1745 5\' 4"  (1.626 m)     Head Circumference --      Peak Flow --      Pain Score 07/12/21 1744 7     Pain Loc --      Pain Edu? --      Excl. in [place]? --    No data found.  Updated Vital Signs BP 104/70    Pulse 93    Temp 98.2 F (36.8 C)    Resp 18    Ht 5\' 4"  (1.626 m)    Wt 254 lb (115.2 kg)    SpO2 100%    BMI 43.60 kg/m   Visual Acuity Right Eye Distance:   Left Eye Distance:   Bilateral Distance:    Right Eye Near:   Left Eye Near:    Bilateral Near:     Physical Exam Vitals and  nursing note reviewed.  Constitutional:      General: She is not in acute distress.    Appearance: She is obese. She is not toxic-appearing.  HENT:     Right Ear: Tympanic membrane, ear canal and external ear normal.     Left Ear: Ear canal and external ear normal.     Ears:     Comments: L TM is gray and dull    Mouth/Throat:     Mouth: Mucous membranes are moist.     Comments: L jaw angle and TMJ is tender and provoked the same pain she has been having.  Eyes:     General: No scleral icterus.    Conjunctiva/sclera: Conjunctivae normal.  Pulmonary:     Effort: Pulmonary effort is normal.  Musculoskeletal:        General: Normal range of motion.     Cervical back: Neck supple.  Lymphadenopathy:     Cervical: No cervical adenopathy.  Skin:    General: Skin is warm and  dry.     Findings: No rash.  Neurological:     Mental Status: She is alert and oriented to person, place, and time.     Gait: Gait normal.  Psychiatric:        Mood and Affect: Mood normal.        Behavior: Behavior normal.        Thought Content: Thought content normal.        Judgment: Judgment normal.     UC Treatments / Results  Labs (all labs ordered are listed, but only abnormal results are displayed) Labs Reviewed - No data to display  EKG   Radiology No results found.  Procedures Procedures (including critical care time)  Medications Ordered in UC Medications  methylPREDNISolone sodium succinate (SOLU-MEDROL) 40 mg/mL injection 40 mg (has no administration in time range)     Initial Impression / Assessment and Plan / UC Course  I have reviewed the triage vital signs and the nursing notes. She was given Solumedrol 40 mg IM here.  L SOM and L TMJ syndrome Pt was advised to take Claritin for the [facility], and needs to be on soft food for 7 days, and I sent Robaxen qhs as noted, and needs FU with specialist.     Final Clinical Impressions(s) / UC Diagnoses   Final diagnoses:  TMJ syndrome     Discharge Instructions      Follow up with a oral surgeon      ED Prescriptions     Medication Sig Dispense Auth. Provider   methocarbamol (ROBAXIN) 500 MG tablet Take 1 tablet (500 mg total) by mouth at bedtime as needed for muscle spasms. 10 tablet Rodriguez-Southworth, Sunday Spillers, PA-C      PDMP not reviewed this encounter.   Shelby Mattocks, [place] 07/12/21 1807

## 2021-07-15 ENCOUNTER — Ambulatory Visit (INDEPENDENT_AMBULATORY_CARE_PROVIDER_SITE_OTHER): Payer: BC Managed Care – PPO | Admitting: *Deleted

## 2021-07-15 ENCOUNTER — Other Ambulatory Visit: Payer: Self-pay

## 2021-07-15 VITALS — BP 131/83 | HR 95 | Temp 98.1°F | Resp 16 | Ht 64.0 in | Wt 250.4 lb

## 2021-07-15 DIAGNOSIS — E860 Dehydration: Secondary | ICD-10-CM | POA: Diagnosis not present

## 2021-07-15 MED ORDER — SODIUM CHLORIDE 0.9 % IV BOLUS
2000.0000 mL | Freq: Once | INTRAVENOUS | Status: AC
  Administered 2021-07-15: 2000 mL via INTRAVENOUS
  Filled 2021-07-15: qty 2000

## 2021-07-15 NOTE — Progress Notes (Signed)
Diagnosis: Dehydration  Provider:  Chilton Greathouse, MD  Procedure: Infusion  IV Type: Peripheral, IV Location: L Hand  Normal Saline, Dose: 2000 ml  Infusion Start Time: 0822 am  Infusion Stop Time: 1035 am  Post Infusion IV Care: Observation period completed and Peripheral IV Discontinued  Discharge: Condition: Good, Destination: Home . AVS provided to patient.   Performed by:  Forrest Moron, RN

## 2021-07-17 ENCOUNTER — Other Ambulatory Visit: Payer: Self-pay

## 2021-07-17 ENCOUNTER — Other Ambulatory Visit (HOSPITAL_BASED_OUTPATIENT_CLINIC_OR_DEPARTMENT_OTHER): Payer: Self-pay

## 2021-07-17 ENCOUNTER — Encounter (HOSPITAL_BASED_OUTPATIENT_CLINIC_OR_DEPARTMENT_OTHER): Payer: Self-pay | Admitting: Pharmacist

## 2021-07-17 ENCOUNTER — Telehealth: Payer: BC Managed Care – PPO | Admitting: Family Medicine

## 2021-07-17 ENCOUNTER — Emergency Department (HOSPITAL_BASED_OUTPATIENT_CLINIC_OR_DEPARTMENT_OTHER)
Admission: EM | Admit: 2021-07-17 | Discharge: 2021-07-17 | Disposition: A | Discharge status: Home / Self Care | Payer: BC Managed Care – PPO | Attending: Emergency Medicine | Admitting: Emergency Medicine

## 2021-07-17 ENCOUNTER — Ambulatory Visit (INDEPENDENT_AMBULATORY_CARE_PROVIDER_SITE_OTHER): Payer: BC Managed Care – PPO | Admitting: *Deleted

## 2021-07-17 ENCOUNTER — Encounter (HOSPITAL_BASED_OUTPATIENT_CLINIC_OR_DEPARTMENT_OTHER): Payer: Self-pay

## 2021-07-17 VITALS — BP 119/80 | HR 88 | Temp 98.0°F | Resp 16 | Ht 64.0 in | Wt 255.2 lb

## 2021-07-17 DIAGNOSIS — Z79899 Other long term (current) drug therapy: Secondary | ICD-10-CM | POA: Insufficient documentation

## 2021-07-17 DIAGNOSIS — R1013 Epigastric pain: Secondary | ICD-10-CM | POA: Diagnosis not present

## 2021-07-17 DIAGNOSIS — R112 Nausea with vomiting, unspecified: Secondary | ICD-10-CM | POA: Insufficient documentation

## 2021-07-17 DIAGNOSIS — F419 Anxiety disorder, unspecified: Secondary | ICD-10-CM | POA: Diagnosis not present

## 2021-07-17 DIAGNOSIS — Z7984 Long term (current) use of oral hypoglycemic drugs: Secondary | ICD-10-CM | POA: Diagnosis not present

## 2021-07-17 DIAGNOSIS — F3162 Bipolar disorder, current episode mixed, moderate: Secondary | ICD-10-CM

## 2021-07-17 DIAGNOSIS — E860 Dehydration: Secondary | ICD-10-CM

## 2021-07-17 LAB — CBC WITH DIFFERENTIAL/PLATELET
Abs Immature Granulocytes: 0.04 10*3/uL (ref 0.00–0.07)
Basophils Absolute: 0 10*3/uL (ref 0.0–0.1)
Basophils Relative: 0 %
Eosinophils Absolute: 0.1 10*3/uL (ref 0.0–0.5)
Eosinophils Relative: 1 %
HCT: 39.6 % (ref 36.0–46.0)
Hemoglobin: 12.9 g/dL (ref 12.0–15.0)
Immature Granulocytes: 0 %
Lymphocytes Relative: 11 %
Lymphs Abs: 1.2 10*3/uL (ref 0.7–4.0)
MCH: 27.6 pg (ref 26.0–34.0)
MCHC: 32.6 g/dL (ref 30.0–36.0)
MCV: 84.6 fL (ref 80.0–100.0)
Monocytes Absolute: 0.7 10*3/uL (ref 0.1–1.0)
Monocytes Relative: 6 %
Neutro Abs: 9.4 10*3/uL — ABNORMAL HIGH (ref 1.7–7.7)
Neutrophils Relative %: 82 %
Platelets: 327 10*3/uL (ref 150–400)
RBC: 4.68 MIL/uL (ref 3.87–5.11)
RDW: 14.6 % (ref 11.5–15.5)
WBC: 11.5 10*3/uL — ABNORMAL HIGH (ref 4.0–10.5)
nRBC: 0 % (ref 0.0–0.2)

## 2021-07-17 LAB — URINALYSIS, ROUTINE W REFLEX MICROSCOPIC
Bilirubin Urine: NEGATIVE
Glucose, UA: NEGATIVE mg/dL
Hgb urine dipstick: NEGATIVE
Ketones, ur: NEGATIVE mg/dL
Leukocytes,Ua: NEGATIVE
Nitrite: NEGATIVE
Protein, ur: NEGATIVE mg/dL
Specific Gravity, Urine: 1.009 (ref 1.005–1.030)
pH: 5 (ref 5.0–8.0)

## 2021-07-17 LAB — RAPID URINE DRUG SCREEN, HOSP PERFORMED
Amphetamines: NOT DETECTED
Barbiturates: NOT DETECTED
Benzodiazepines: NOT DETECTED
Cocaine: NOT DETECTED
Opiates: NOT DETECTED
Tetrahydrocannabinol: NOT DETECTED

## 2021-07-17 LAB — COMPREHENSIVE METABOLIC PANEL
ALT: 8 U/L (ref 0–44)
AST: 9 U/L — ABNORMAL LOW (ref 15–41)
Albumin: 3.9 g/dL (ref 3.5–5.0)
Alkaline Phosphatase: 112 U/L (ref 38–126)
Anion gap: 14 (ref 5–15)
BUN: 9 mg/dL (ref 6–20)
CO2: 20 mmol/L — ABNORMAL LOW (ref 22–32)
Calcium: 8.5 mg/dL — ABNORMAL LOW (ref 8.9–10.3)
Chloride: 110 mmol/L (ref 98–111)
Creatinine, Ser: 0.74 mg/dL (ref 0.44–1.00)
GFR, Estimated: >60 mL/min (ref >60)
Glucose, Bld: 79 mg/dL (ref 70–99)
Potassium: 3.5 mmol/L (ref 3.5–5.1)
Sodium: 144 mmol/L (ref 135–145)
Total Bilirubin: 0.3 mg/dL (ref 0.3–1.2)
Total Protein: 6.3 g/dL — ABNORMAL LOW (ref 6.5–8.1)

## 2021-07-17 LAB — LIPASE, BLOOD: Lipase: 50 U/L (ref 11–51)

## 2021-07-17 LAB — PREGNANCY, URINE: Preg Test, Ur: NEGATIVE

## 2021-07-17 LAB — PREALBUMIN: Prealbumin: 22 mg/dL (ref 18–38)

## 2021-07-17 LAB — MAGNESIUM: Magnesium: 1.8 mg/dL (ref 1.7–2.4)

## 2021-07-17 MED ORDER — SODIUM CHLORIDE 0.9 % IV BOLUS
1000.0000 mL | Freq: Once | INTRAVENOUS | Status: AC
  Administered 2021-07-17: 1000 mL via INTRAVENOUS

## 2021-07-17 MED ORDER — MORPHINE SULFATE (PF) 4 MG/ML IV SOLN
4.0000 mg | Freq: Once | INTRAVENOUS | Status: AC
  Administered 2021-07-17: 4 mg via INTRAVENOUS
  Filled 2021-07-17: qty 1

## 2021-07-17 MED ORDER — PROMETHAZINE HCL 6.25 MG/5ML PO SYRP
ORAL_SOLUTION | ORAL | 0 refills | Status: DC
  Filled 2021-07-17: qty 100, 3d supply, fill #0

## 2021-07-17 MED ORDER — SODIUM CHLORIDE 0.9 % IV SOLN
12.5000 mg | Freq: Once | INTRAVENOUS | Status: AC
  Administered 2021-07-17: 12.5 mg via INTRAVENOUS
  Filled 2021-07-17: qty 0.5

## 2021-07-17 MED ORDER — HYDROCODONE-ACETAMINOPHEN 5-325 MG PO TABS
1.0000 | ORAL_TABLET | Freq: Once | ORAL | Status: AC
  Administered 2021-07-17: 1 via ORAL
  Filled 2021-07-17: qty 1

## 2021-07-17 MED ORDER — HYDROCODONE-ACETAMINOPHEN 5-325 MG PO TABS: 1.0000 | ORAL_TABLET | ORAL | 0 refills | Status: DC | PRN

## 2021-07-17 MED ORDER — SODIUM CHLORIDE 0.9 % IV BOLUS
2000.0000 mL | Freq: Once | INTRAVENOUS | Status: AC
  Administered 2021-07-17: 2000 mL via INTRAVENOUS
  Filled 2021-07-17: qty 2000

## 2021-07-17 MED ORDER — SODIUM CHLORIDE 0.9 % IV SOLN
12.5000 mg | Freq: Once | INTRAVENOUS | Status: DC
  Filled 2021-07-17: qty 0.5

## 2021-07-17 MED ORDER — PROMETHAZINE HCL 25 MG/ML IJ SOLN
INTRAMUSCULAR | Status: AC
  Filled 2021-07-17: qty 1

## 2021-07-17 MED ORDER — FAMOTIDINE IN NACL 20-0.9 MG/50ML-% IV SOLN
20.0000 mg | Freq: Once | INTRAVENOUS | Status: AC
  Administered 2021-07-17: 20 mg via INTRAVENOUS
  Filled 2021-07-17: qty 50

## 2021-07-17 MED ORDER — PROMETHAZINE HCL 25 MG/ML IJ SOLN
INTRAMUSCULAR | Status: AC
  Administered 2021-07-17: 12.5 mg
  Filled 2021-07-17: qty 1

## 2021-07-17 MED ORDER — LORAZEPAM 2 MG/ML IJ SOLN
1.0000 mg | Freq: Once | INTRAMUSCULAR | Status: AC
  Administered 2021-07-17: 1 mg via INTRAVENOUS
  Filled 2021-07-17: qty 1

## 2021-07-17 NOTE — Telephone Encounter (Signed)
Patient is scheduled today for a virtual.  ?

## 2021-07-17 NOTE — ED Provider Notes (Signed)
[facility] EMERGENCY DEPT Provider Note   CSN: [id number] Arrival date & time: 07/17/21  1507     History  Chief Complaint  Patient presents with   Abdominal Pain   Psychiatric Evaluation    [name] [name] is a 36 y.o. female.  Pt is a 36 yo wf with a hx of a gastric bypass in 08/2012.  She has been followed by Dr. Andrey Campanile (surgery).  She has had problems with tolerating solid foods.  She reported n/v and epigastric pain after eating.  She was set up for twice a week IVF infusions.  She has had several ED visits for the same.  Pt did see Dr. Christella Hartigan (GI) and had an endoscopy (07/04/21) which showed a small hiatal hernia.  Biopsies were essentially normal.  She was negative for h.pylori.  Pt has been taking zofran odt and phenergan suppositories for nausea, but they are not helping.  This afternoon, she ate some hummus and felt severe burning in her epigastrium.  Pt has also had problems with sleeping and with anxiety.  She has seen her pcp for this problem.  On 1/26, her pcp increased her Pamlor dose to 20 mg and Klonopin to QID.  Pt said she has increased those doses and they are not helping.  Pt just lost her uncle on 1/25.  She has also been very depressed and has "bad thoughts."  Pt said she has tried to get in with her therapist, but has been unable to do so.         Home Medications Prior to Admission medications   Medication Sig Start Date End Date Taking? Authorizing Provider  HYDROcodone-acetaminophen (NORCO/VICODIN) 5-325 MG tablet Take 1 tablet by mouth every 4 (four) hours as needed. 07/17/21  Yes Jacalyn Lefevre, MD  acetaminophen (TYLENOL) 500 MG tablet Take 2 tablets (1,000 mg total) by mouth every 6 (six) hours as needed for moderate pain. 02/13/19   Fayrene Helper, PA-C  busPIRone (BUSPAR) 15 MG tablet Take 15 mg by mouth 3 (three) times daily. 11/01/19   [provider]  CALCIUM PO Take 1 tablet by mouth daily.    [provider]  clonazePAM  (KLONOPIN) 1 MG tablet Take 0.5 tablets (0.5 mg total) by mouth 2 (two) times daily as needed for anxiety. 03/27/21   Willow Ora, MD  clotrimazole (MYCELEX) 10 MG troche Take 10 mg by mouth 5 (five) times daily. 09/24/20   [provider]  cyanocobalamin (,VITAMIN B-12,) 1000 MCG/ML injection Inject 1 vial per week for 3 weeks, then 1 vial per month thereafter 06/21/20   Willow Ora, MD  diclofenac Sodium (VOLTAREN) 1 % GEL Apply 4 g topically 4 (four) times daily. Patient taking differently: Apply 4 g topically 4 (four) times daily as needed (pain). 05/13/19   Palumbo, April, MD  hydrOXYzine (VISTARIL) 50 MG capsule Take 50 mg by mouth every 6 (six) hours as needed. 12/26/20   [provider]  hyoscyamine (LEVSIN) 0.125 MG tablet Take 1 tablet (0.125 mg total) by mouth every 6 (six) hours as needed for Cramping for up to 10 days 06/04/21     lamoTRIgine (LAMICTAL) 150 MG tablet Take 300 mg by mouth daily.  04/10/20   [provider]  lithium carbonate (LITHOBID) 300 MG CR tablet Take 600 mg by mouth at bedtime.    [provider]  Magnesium 500 MG TABS Take 1 tablet by mouth daily.    [provider]  metFORMIN (GLUCOPHAGE)  500 MG tablet Take 500 mg by mouth 2 (two) times daily. 11/01/19   [provider]  methocarbamol (ROBAXIN) 500 MG tablet Take 1 tablet (500 mg total) by mouth at bedtime as needed for muscle spasms. 07/12/21   Rodriguez-Southworth, Nettie ElmSylvia, PA-C  nortriptyline (PAMELOR) 10 MG capsule TAKE 1 CAPSULE BY MOUTH AT BEDTIME. 04/04/21   Kirsteins, Victorino SparrowAndrew E, MD  ondansetron (ZOFRAN) 4 MG tablet Take 1 tablet (4 mg total) by mouth every 6 (six) hours. 09/22/20   Jacalyn LefevreHaviland, Lizabeth Fellner, MD  pantoprazole (PROTONIX) 20 MG tablet TAKE 1 TABLET BY MOUTH EVERY DAY 06/21/21   Willow OraAndy, Camille L, MD  promethazine (PHENERGAN) 25 MG tablet Take 1 tablet (25 mg total) by mouth every 6 (six) hours as needed for nausea or vomiting. 06/19/21   Prosperi, Christian  H, PA-C  promethazine (PHENERGAN) 6.25 MG/5ML syrup Take 10 mLs (12.5 mg total) by mouth every 6 (six) hours as needed for Nausea for up to 14 days 07/17/21     QUEtiapine (SEROQUEL) 400 MG tablet Take 400 mg by mouth at bedtime.  08/24/19   [provider]  QUEtiapine (SEROQUEL) 50 MG tablet Take by mouth. 06/27/20   [provider]  SYRINGE-NEEDLE, DISP, 3 ML 25G X 1" 3 ML MISC Use 1 needle per application once weekly 06/21/20   Willow OraAndy, Camille L, MD      Allergies    Nsaids, Other, and Zolpidem    Review of Systems   Review of Systems  Gastrointestinal:  Positive for abdominal pain, nausea and vomiting.  Psychiatric/Behavioral:  Positive for dysphoric mood and sleep disturbance.   All other systems reviewed and are negative.  Physical Exam Updated Vital Signs BP 122/89    Pulse (!) 103    Temp 98.2 F (36.8 C)    Resp 17    LMP 06/25/2021 (Exact Date)    SpO2 99%  Physical Exam Vitals and nursing note reviewed.  Constitutional:      Appearance: She is well-developed. She is obese.  HENT:     Head: Normocephalic and atraumatic.     Mouth/Throat:     Mouth: Mucous membranes are moist.     Pharynx: Oropharynx is clear.  Eyes:     Extraocular Movements: Extraocular movements intact.     Pupils: Pupils are equal, round, and reactive to light.  Cardiovascular:     Rate and Rhythm: Normal rate and regular rhythm.  Pulmonary:     Effort: Pulmonary effort is normal.     Breath sounds: Normal breath sounds.  Abdominal:     General: Abdomen is flat. Bowel sounds are normal.     Palpations: Abdomen is soft.     Tenderness: There is abdominal tenderness in the epigastric area.  Skin:    General: Skin is warm.     Capillary Refill: Capillary refill takes less than 2 seconds.  Neurological:     General: No focal deficit present.     Mental Status: She is alert and oriented to person, place, and time.  Psychiatric:        Mood and Affect: Mood normal.        Behavior:  Behavior normal.    ED Results / Procedures / Treatments   Labs (all labs ordered are listed, but only abnormal results are displayed) Labs Reviewed  CBC WITH DIFFERENTIAL/PLATELET - Abnormal; Notable for the following components:      Result Value   WBC 11.5 (*)    Neutro Abs 9.4 (*)  All other components within normal limits  COMPREHENSIVE METABOLIC PANEL - Abnormal; Notable for the following components:   CO2 20 (*)    Calcium 8.5 (*)    Total Protein 6.3 (*)    AST 9 (*)    All other components within normal limits  URINALYSIS, ROUTINE W REFLEX MICROSCOPIC - Abnormal; Notable for the following components:   Color, Urine COLORLESS (*)    Bacteria, UA RARE (*)    All other components within normal limits  LIPASE, BLOOD  PREGNANCY, URINE  PREALBUMIN  MAGNESIUM  RAPID URINE DRUG SCREEN, HOSP PERFORMED  TSH    EKG None  Radiology No results found.  Procedures Procedures    Medications Ordered in ED Medications  promethazine (PHENERGAN) 25 MG/ML injection (has no administration in time range)  promethazine (PHENERGAN) 12.5 mg in sodium chloride 0.9 % 50 mL IVPB (12.5 mg Intravenous Not Given 07/17/21 2227)  HYDROcodone-acetaminophen (NORCO/VICODIN) 5-325 MG per tablet 1 tablet (has no administration in time range)  famotidine (PEPCID) IVPB 20 mg premix (0 mg Intravenous Stopped 07/17/21 1824)  promethazine (PHENERGAN) 12.5 mg in sodium chloride 0.9 % 50 mL IVPB (0 mg Intravenous Stopped 07/17/21 1633)  sodium chloride 0.9 % bolus 1,000 mL (0 mLs Intravenous Stopped 07/17/21 1824)  morphine (PF) 4 MG/ML injection 4 mg (4 mg Intravenous Given 07/17/21 1645)  morphine (PF) 4 MG/ML injection 4 mg (4 mg Intravenous Given 07/17/21 1821)  LORazepam (ATIVAN) injection 1 mg (1 mg Intravenous Given 07/17/21 1823)  promethazine (PHENERGAN) 25 MG/ML injection (12.5 mg  Given 07/17/21 2219)    ED Course/ Medical Decision Making/ A&P                           Medical Decision  Making Amount and/or Complexity of Data Reviewed Labs: ordered.  Risk Prescription drug management.   Abd pain labs were ordered and reviewed.  They show nothing acute.  Albumin has dropped slightly from 4.2 (1/4) to 3.9, but it is still nl.  I do not think there is anything about her chronic abd pain I can fix for good.  However, she is given phenergan and pepcid for sx.  Nausea is better, but pain is still there.  Due to this, pt given morphine.  Pain improved.  Mood improved with pain control.  TTS consult ordered due to severe depression.  TTS recommended f/u with the partial hospitalization program to address her sx.  Unfortunately, pt declined.  I think this would have helped her quite a bit.  Her abd pain and nausea may be some somatiform sx as her sx have worsened after a recent death.  Pt is tolerating po fluids.  She is stable for d/c.  She is to f/u with pcp.  She is given a rx for lortab (#10) to help with pain and sleep.  She is to f/u with her psychiatrist and is instructed to return if worse.         Final Clinical Impression(s) / ED Diagnoses Final diagnoses:  Epigastric pain  Bipolar disorder, current episode mixed, moderate (HCC)    Rx / DC Orders ED Discharge Orders          Ordered    HYDROcodone-acetaminophen (NORCO/VICODIN) 5-325 MG tablet  Every 4 hours PRN        07/17/21 2229              Jacalyn Lefevre, MD 07/17/21 2234

## 2021-07-17 NOTE — Progress Notes (Signed)
Diagnosis: Dehydration  Provider:  Chilton Greathouse, MD  Procedure: Infusion  IV Type: Peripheral, IV Location: L Forearm  Normal Saline, Dose:  Infusion Start Time: 1017 am  Infusion Stop Time: 1221 pm  Post Infusion IV Care: Observation period completed and Peripheral IV Discontinued  Discharge: Condition: Good, Destination: Home . AVS provided to patient.   Performed by:  Forrest Moron, RN

## 2021-07-17 NOTE — ED Triage Notes (Signed)
Pt has hiatal hernia and states she has been in increased pain, n/v and has received IV fluids 2 x weekly for 5 weeks.  Dr. Gaynelle Adu gastroenterologist.   Pt has not been able to sleep,  increased anxiety and has affected her depression

## 2021-07-17 NOTE — BH Assessment (Addendum)
Comprehensive Clinical Assessment (CCA) Screening, Triage and Referral Note  07/17/2021 [name] [name] [id number]  Disposition:  -TTS completed. Per Melbourne Abts, NP patient does not meet criteria for inpatient psychiatric treatment. She is psych cleared to discharge home. Patient reported suicidal ideations. However, no plan, no intent, no access to means. She was able to contract for safety and able to identify protective factors. Discuss safety planning and patient is aware that she is willing to return to the ER, [facility], and/or [facility] if symptoms worsen. Also, call mobile crises and/or 911 as additional options to seek help. -Patient was recommended to follow up with out Partial Hospitalization program and/or PHP to address her symptoms (anxiety, depression, SI, grief, medication management, insomnia, etc.). Clinician explained to patient the benefits of the program as a source to provide her with intense therapy, med management, case management, etc. However, she declined the offer.  -Therefore,  patient is recommended to follow up with her current psychiatric provider at the [facility] in [place] and PCP to address her medication concerns as it relates to her current symptoms. The provider also recommend that she follows up with her current therapist by re-establishing services. -Patient requested medications to assist with her need difficulty in sleeping. The [facility] provider, however; recommends that patient follow up with current providers/prescribes to manage her current medication regiment. Patient asked if the EDP would be willing to prescribe a sedative to help her sleep and she was informed the such decision would be at the [facility] discretion.  -Disposition updates provided to the EDP (Dr. Particia Nearing) and patient's nurse [name] Schiller, RN).   Flowsheet Row ED from 07/17/2021 in [facility] ED from 07/12/2021 in [facility]  Admission (Discharged) from  07/04/2021 in [facility] ENDOSCOPY  C-SSRS RISK CATEGORY Moderate Risk No Risk No Risk      The patient demonstrates the following risk factors for suicide: Chronic risk factors for suicide include: psychiatric disorder of Bipolar Disorder; Major Depressive Disorder, Recurrent, Severe, w/o psychotic features; Anxiety Disorder . Acute risk factors for suicide include:  medical symptoms and insomnia . Protective factors for this patient include: responsibility to others (children, family) and religious beliefs against suicide. Considering these factors, the overall suicide risk at this point appears to be moderate. Patient is appropriate for outpatient follow up.   Chief Complaint:  Chief Complaint  Patient presents with   Abdominal Pain   Psychiatric Evaluation   Visit Diagnosis: Bipolar Disorder; Major Depressive Disorder, Recurrent, Severe, w/o psychotic features; Anxiety Disorder  [name] [name] is a 36 y.o. female dx's with Bipolar Disorder. She was dx's in 2021. States that she has only been able to sleep 2-3 hours per night in 2 weeks. Currently prescribed psychotropic medications from the [facility], Lamotrigine, Seroquel, Buspar, Hydroxyzine, and Lithium. However, her PCP [name] [name]) prescribes her Klonopin. Her last appointment with her provider at the [facility] was 3 months ago. States that her appointments average every 3 months. She has tried to reach out to her provider that the [facility] and says They hung up on me. She was asked about her next appointment with the [facility] and states, I will try to call tomorrow, to see if I can get someone on the phone.  However, says that her PCP is more responsive and she is able to see the PCP more often.   Patient reporting current suicidal ideations. She has felt suicidal  for the past week due to sleep deprivation. Denies plan and intent. Denies a hx of suicide  attempts and/gestures. Denies hx of self-injurious behaviors. Protective factor: 2 daughters and religion. Denies access to means, no firearms.  She has family history of Bipolar Disorder (Dad). Mother dx's with Depressive Disorder and anxiety. She has severe anxiety stating that she has panic disorder.  She reports having 3 panic attacks today and two last night. Denies a hx of trauma and/or abuse. Current support system is her spouse, mom, best friend, and I have a large church family that I can talk to.   Current stressors: I have a hernia and my doctor will not repair it because of my BMI, I am in constant pain, I haven't been able to eat a meal in 4 weeks. She suffers from Fibromyalgia. Also, reports that her uncle unexpectedly passed away 1 week ago (last Wednesday). States that she was very close to her uncle and his death has exacerbated her mental health symptoms.   Patient denies homicidal ideations. Denies hx of aggressive and/or assaultive behaviors. No current legal issues. No court dates pending. Patient is not on probation and/or parole. Patient states that she has visual hallucinations intermittently when she takes her Seroquel. However, feels that it could be a light from her daughter's bed room. She only experiences this when she is typically alone. Denies auditory hallucinations. No alcohol and/or drug use.   She denies a hx of inpatient psychiatric treatment. However, says that she has presented to the Emergency Department in 2020 with suicidal ideations because of issues related to her gall bladder. She tearfully says, The doctors didn't believe me about my pain, however; ended up taking it out after dinging out I was telling the truth. Patient has a therapist, [name] [name]. Says that she was involved in therapy 1x every 6 weeks. However, started feeling better so she stopped going. Last visit with therapy was August 2022.   Patient currently living with spouse and 2 daughters.  She is currently employed as a [employer] teacher.  Highest level of education is college. Raised by both parents. She has siblings-2 sisters and multiple step sisters/brother.   Patient was alert and oriented to person, place, time, and situation. She was sitting upright in the bed, calm, cooperative, tearful. Speech is normal. Insight and and judgement are fair. She does not appear to be responding to internal stimuli. No issues with observed with patient responding to internal stimuli. Thought processes are congruent and she answered all questions appropriately.   Patient Reported Information How did you hear about us? Other (Comment)  What Is the Reason for Your Visit/Call Today? [name] [name] is a 36 y.o. female dxs with Bipolar Disorder. She was dxs in 2021. States that she has only been able to sleep 2-3 hours per night in 2 weeks. Currently prescribed psychotropic medications from the [facility], Lamotrigine, Seroquel, Buspar, Hydroxyzine, and Lithium. However, her PCP [name] [name]([name] [name]) prescribes her Klonopin. Her last appointment with her provider at the [facility] was 3 months ago. States that her appointments average every 3 months. She has tried to reach out to her provider that the [facility] and says They hung up on me. She was asked about her next appointment with the [facility] and states, I will try to call tomorrow, to see if I can get someone on the phone.  However, says that her PCP is more responsive and she is able to  see the PCP more often.   Patient reporting current suicidal ideations. She has felt suicidal for the past week due to sleep deprivation. Denies plan and intent. Denies a hx of suicide attempts and/gestures. Denies hx of self-injurious behaviors. Protective factor: 2 daughters and religion. Denies access to means, no firearms.  She has family history of Bipolar Disorder (Dad). Mother dxs with Depressive Disorder and  anxiety. She has severe anxiety stating that she has panic disorder.  She reports having 3 panic attacks today and two last night. Denies a hx of trauma and/or abuse. Current support system is her spouse, mom, best friend, and I have a large church family that I can talk to.   Current stressors: I have a hernia and my doctor will not repair it because of my BMI, I am in constant pain, I havent been able to eat a meal in 4 weeks. She suffers from Fibromyalgia. Also, reports that her uncle unexpectedly passed away 1 week ago (last Wednesday). States that she was very close to her uncle and his death has exacerbated her mental health symptoms.   Patient denies homicidal ideations. Denies hx of aggressive and/or assaultive behaviors. No current legal issues. No court dates pending. Patient is not on probation and/or parole. Patient states that she has visual hallucinations intermittently when she takes her Seroquel. However, feels that it could be a light from her daughters bed room. She only experiences this when she is typically alone. Denies auditory hallucinations. No alcohol and/or drug use.  How Long Has This Been Causing You Problems? > than 6 months  What Do You Feel Would Help You the Most Today? Treatment for Depression or other mood problem   Have You Recently Had Any Thoughts About Hurting Yourself? Yes  Are You Planning to Commit Suicide/Harm Yourself At This time? No   Have you Recently Had Thoughts About Hurting Someone Karolee Ohslse? No  Are You Planning to Harm Someone at This Time? No  Explanation: No data recorded  Have You Used Any Alcohol or Drugs in the Past 24 Hours? Yes  How Long Ago Did You Use Drugs or Alcohol? No data recorded What Did You Use and How Much? CBD gummies   Do You Currently Have a Therapist/Psychiatrist? No  Name of Therapist/Psychiatrist: No data recorded  Have You Been Recently Discharged From Any Office Practice or Programs? No  Explanation of  Discharge From Practice/Program: No data recorded   CCA Screening Triage Referral Assessment Type of Contact: Tele-Assessment  Telemedicine Service Delivery: Telemedicine service delivery: This service was provided via telemedicine using a 2-way, interactive audio and video technology  Is this Initial or Reassessment? Initial Assessment  Date Telepsych consult ordered in [facility]:  07/17/21  Time Telepsych consult ordered in [facility]:  No data recorded Location of Assessment: [facility]  [facility]   Collateral Involvement: No data recorded  Does Patient Have a Court Appointed Legal Guardian? No data recorded Name and Contact of Legal Guardian: No data recorded If Minor and Not Living with Parent(s), Who has Custody? No data recorded Is CPS involved or ever been involved? Never  Is APS involved or ever been involved? Never   Patient Determined To Be At Risk for Harm To Self or Others Based on Review of Patient Reported Information or Presenting Complaint? No  Method: No data recorded Availability of Means: No data recorded Intent: No data recorded Notification Required: No data recorded Additional Information for Danger to Others Potential: No  data recorded Additional Comments for Danger to Others Potential: No data recorded Are There Guns or Other Weapons in Your Home? No data recorded Types of Guns/Weapons: No data recorded Are These Weapons Safely Secured?                            No data recorded Who Could Verify You Are Able To Have These Secured: No data recorded Do You Have any Outstanding Charges, Pending Court Dates, Parole/Probation? No data recorded Contacted To Inform of Risk of Harm To Self or Others: No data recorded  Does Patient Present under Involuntary Commitment? No  IVC Papers Initial File Date: No data recorded  [place] of Residence: [place]   Patient Currently Receiving the Following Services: Individual Therapy; Medication  Management   Determination of Need: Routine (7 days)   Options For Referral: Medication Management; Outpatient Therapy   Discharge Disposition:     Melynda Ripple, Counselor

## 2021-07-17 NOTE — BH Assessment (Signed)
@  1919, Clinician requested patient's nurse (Ranette, RN) via secure chat to set up the TTS machine for patient's initial TTS assessment to be completed.

## 2021-07-17 NOTE — Telephone Encounter (Signed)
See note. thanks 

## 2021-07-18 ENCOUNTER — Other Ambulatory Visit (HOSPITAL_BASED_OUTPATIENT_CLINIC_OR_DEPARTMENT_OTHER): Payer: Self-pay

## 2021-07-18 ENCOUNTER — Encounter: Payer: Self-pay | Admitting: Family Medicine

## 2021-07-18 LAB — TSH: TSH: 1.463 u[IU]/mL (ref 0.350–4.500)

## 2021-07-19 ENCOUNTER — Ambulatory Visit (INDEPENDENT_AMBULATORY_CARE_PROVIDER_SITE_OTHER): Payer: BC Managed Care – PPO | Admitting: Physician Assistant

## 2021-07-19 ENCOUNTER — Other Ambulatory Visit: Payer: Self-pay

## 2021-07-19 ENCOUNTER — Other Ambulatory Visit: Payer: Self-pay | Admitting: Physician Assistant

## 2021-07-19 ENCOUNTER — Other Ambulatory Visit (HOSPITAL_COMMUNITY): Payer: Self-pay

## 2021-07-19 ENCOUNTER — Other Ambulatory Visit (HOSPITAL_BASED_OUTPATIENT_CLINIC_OR_DEPARTMENT_OTHER): Payer: Self-pay

## 2021-07-19 ENCOUNTER — Encounter: Payer: Self-pay | Admitting: Physician Assistant

## 2021-07-19 VITALS — BP 138/88 | HR 107 | Temp 97.8°F | Ht 64.0 in | Wt 254.4 lb

## 2021-07-19 DIAGNOSIS — R45851 Suicidal ideations: Secondary | ICD-10-CM | POA: Diagnosis not present

## 2021-07-19 DIAGNOSIS — F5105 Insomnia due to other mental disorder: Secondary | ICD-10-CM | POA: Diagnosis not present

## 2021-07-19 DIAGNOSIS — F99 Mental disorder, not otherwise specified: Secondary | ICD-10-CM | POA: Diagnosis not present

## 2021-07-19 MED ORDER — ZOLPIDEM TARTRATE ER 6.25 MG PO TBCR
6.2500 mg | EXTENDED_RELEASE_TABLET | Freq: Every evening | ORAL | 0 refills | Status: DC | PRN
  Filled 2021-07-19: qty 7, 7d supply, fill #0

## 2021-07-19 NOTE — Progress Notes (Signed)
Subjective:    Patient ID: [name] [name], female    DOB: [birth year], 36 y.o.   MRN: [id number]  Chief Complaint  Patient presents with   Insomnia   Suicidal    HPI Patient [place]. States she hasn't sleep for more than 2-3 hours per night in the last two weeks. She feels like her bipolar medications are not helping anymore. Suicidal thoughts are occurring, but says she would never act on them.   Taking Pamelor 10 mg, tried to increase to 20 mg for the last week, but hasn't helped her to sleep.   She is taking Klonopin 1 mg - took two yesterday and one half this morning.   Sees the [facility] Dr. Pieter PartridgeMiriam - says she last saw her about 8 weeks ago, virtually b/c provider in [place]. Has appt scheduled for Monday  07/22/21.  Went to ED 07/17/21 for hernia pain and SI. She declined inpatient treatment.  Appointment with her pastor for counseling next week.    Past Medical History:  Diagnosis Date   ADD (attention deficit disorder)    Anemia    Anxiety    B12 deficiency    Back pain    Bilateral swelling of feet    Bipolar 2 disorder (HCC) 09/19/2019   Constipation    DEPRESSION    Elevated cholesterol    Family history of colon cancer 05/16/2019   Father, 3850s; deceased.    Fatty liver    Fibromyalgia    Gallbladder problem    GERD (gastroesophageal reflux disease)    Heartburn    History of stomach ulcers    Hypertension    Hypothyroidism    Hx of, normalized TSH during pregnancy 2011   Infertility, female    Lactose intolerance    Migraines    Morbid obesity (HCC)    s/p RY 08/2012 - start weight 290 pounds   Nephrolithiasis    Palpitations    Panic disorder    PCOS (polycystic ovarian syndrome)    PONV (postoperative nausea and vomiting)    Pregnancy induced hypertension    Shortness of breath    Vitamin D deficiency     Past Surgical History:  Procedure Laterality Date   Quintella ReichertIKEN OSTEOTOMY Left 06/24/2017   Procedure: Quintella ReichertAIKEN OSTEOTOMY;   Surgeon: Vivi BarrackWagoner, Matthew R, DPM;  Location: [facility];  Service: Podiatry;  Laterality: Left;   BIOPSY  07/04/2021   Procedure: BIOPSY;  Surgeon: Rachael FeeJacobs, Daniel P, MD;  [facility];  Service: Endoscopy;;   BUNIONECTOMY Left 06/24/2017   Procedure: Anthoney HaradaALSTON BUNIONECTOMY;  Surgeon: Vivi BarrackWagoner, Matthew R, DPM;  Location: [facility];  Service: Podiatry;  Laterality: Left;   CESAREAN SECTION     x 2   CHOLECYSTECTOMY N/A 02/15/2019   Procedure: LAPAROSCOPIC CHOLECYSTECTOMY WITH INTRAOPERATIVE CHOLANGIOGRAM;  Surgeon: Andria MeuseWhite, Christopher M, MD;  Location: [facility];  Service: General;  Laterality: N/A;   COLON RESECTION N/A 05/04/2020   Procedure: DIAGNOSTIC LAPAROSCOPY, RESECTION OF CANDYCANE, UPPER ENDOSCOPY;  Surgeon: Berna Bueonnor, Chelsea A, MD;  Location: [facility];  Service: General;  Laterality: N/A;   ESOPHAGOGASTRODUODENOSCOPY (EGD) WITH PROPOFOL N/A 07/04/2021   Procedure: ESOPHAGOGASTRODUODENOSCOPY (EGD) WITH PROPOFOL;  Surgeon: Rachael FeeJacobs, Daniel P, MD;  Location: [facility];  Service: Endoscopy;  Laterality: N/A;   FOOT SURGERY     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   Procedure: LAPAROSCOPIC ROUX-EN-Y GASTRIC;  Surgeon: Atilano InaEric M Wilson, MD;  Location: [facility];  Service: General;  Laterality:  N/A;  laparoscopic roux-en-y gastric bypass   LITHOTRIPSY Left    TONSILLECTOMY     TUBAL LIGATION     UPPER GI ENDOSCOPY  08/24/2012   Procedure: UPPER GI ENDOSCOPY;  Surgeon: Atilano Ina, MD;  Location: [facility];  Service: General;;   WISDOM TOOTH EXTRACTION      Family History  Problem Relation Age of Onset   Hyperlipidemia Father    Hypertension Father    Kidney disease Father    Colon cancer Father 33   Cancer Father    Depression Father    Anxiety disorder Father    Bipolar disorder Father    Sleep apnea Father    Obesity Father    Hypertension Mother    Depression Mother    Anxiety disorder Mother    Obesity Mother    Hypertension Maternal Grandmother    Uterine  cancer Maternal Grandmother 83   Diabetes Maternal Grandfather     Social History   Tobacco Use   Smoking status: Never   Smokeless tobacco: Never  Vaping Use   Vaping Use: Never used  Substance Use Topics   Alcohol use: Not Currently    Comment: rare   Drug use: No     Allergies  Allergen Reactions   Nsaids Other (See Comments)    Gastric bypass "gastric bypass" Other reaction(s): Other (See Comments) Gastric bypass "gastric bypass" Gastric bypass "gastric bypass"    Other Other (See Comments)    Ambien "sleep walking" Other reaction(s): Other (See Comments)   Zolpidem Other (See Comments)    Sleep walking  Sleep walking  Other reaction(s): Other (See Comments), Other (See Comments) "sleep walking" Sleep walking  Sleep walking  Sleep walking  "sleep walking" Sleep walking  Sleep walking  Sleep walking  "sleep walking" Sleep walking  Sleep walking  Sleep walking      Review of Systems NEGATIVE UNLESS OTHERWISE INDICATED IN HPI      Objective:     BP 138/88    Pulse (!) 107    Temp 97.8 F (36.6 C)    Ht 5\' 4"  (1.626 m)    Wt 254 lb 6.1 oz (115.4 kg)    LMP 06/25/2021 (Exact Date)    SpO2 96%    BMI 43.66 kg/m   Wt Readings from Last 3 Encounters:  07/21/21 250 lb (113.4 kg)  07/19/21 254 lb 6.1 oz (115.4 kg)  07/17/21 255 lb 3.2 oz (115.8 kg)    BP Readings from Last 3 Encounters:  07/21/21 (!) 155/102  07/19/21 138/88  07/17/21 122/82     Physical Exam Constitutional:      General: She is in acute distress.  Cardiovascular:     Pulses: Normal pulses.  Pulmonary:     Effort: Pulmonary effort is normal.     Breath sounds: Normal breath sounds.  Neurological:     General: No focal deficit present.     Mental Status: She is oriented to person, place, and time.  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    Dispense:  7 tablet    Refill:  0   1. Insomnia due to other mental disorder 2. Suicidal ideations -I personally reviewed ED report from 07/17/2021 -She is still having suicidal thoughts, but no plan or actions. Says she feels like if she could just get some sleep she would maybe feel better. -She would like to try Ambien again. Says she had one episode of sleep-walking with this in the past, but otherwise it helped her sleep. -I discussed with her PCP today, agree to trial this medication again, informed to not take with her Klonopin. -She will f/up with psychiatrist on Monday. -Due to acute distress, advised close f/up with PCP as well, will recheck on the ambien and how she is doing next week.  -Very low threshold to return to ED. I still advise that inpatient treatment would be most beneficial for her at this time.    Time Spent: 38 minutes of total time was spent on the date of the encounter performing the following actions: chart review prior to seeing the patient, obtaining history, performing a medically necessary exam, counseling on the treatment plan, placing orders, and documenting in our EHR.    Tocarra Gassen M Talayla Doyel, PA-C

## 2021-07-19 NOTE — Progress Notes (Deleted)
3

## 2021-07-19 NOTE — Patient Instructions (Addendum)
Good to meet you today. I have sent Ambien to your pharmacy. Take one tablet immediately before your bedtime tonight. DO NOT TAKE WITH KLONOPIN.   Do not nap. Go for at least a 15 minute walk outside today. Do one task at home today - declutter one area, fold a load of laundry, do your hair or nails, etc.   Follow up with your psychiatrist on Monday as scheduled.  Back to the ER this weekend if any thoughts become pervasive and/or intentions change.   Close follow up with Dr. Jonni Sanger next week as well.

## 2021-07-21 ENCOUNTER — Encounter (HOSPITAL_COMMUNITY): Payer: Self-pay

## 2021-07-21 ENCOUNTER — Emergency Department (HOSPITAL_COMMUNITY)
Admission: EM | Admit: 2021-07-21 | Discharge: 2021-07-21 | Disposition: A | Discharge status: Home / Self Care | Payer: BC Managed Care – PPO | Attending: Emergency Medicine | Admitting: Emergency Medicine

## 2021-07-21 ENCOUNTER — Other Ambulatory Visit: Payer: Self-pay

## 2021-07-21 DIAGNOSIS — R1110 Vomiting, unspecified: Secondary | ICD-10-CM | POA: Insufficient documentation

## 2021-07-21 DIAGNOSIS — R1013 Epigastric pain: Secondary | ICD-10-CM | POA: Insufficient documentation

## 2021-07-21 DIAGNOSIS — R197 Diarrhea, unspecified: Secondary | ICD-10-CM | POA: Diagnosis not present

## 2021-07-21 DIAGNOSIS — R109 Unspecified abdominal pain: Secondary | ICD-10-CM | POA: Diagnosis not present

## 2021-07-21 LAB — CBC
HCT: 38.5 % (ref 36.0–46.0)
Hemoglobin: 12.6 g/dL (ref 12.0–15.0)
MCH: 28 pg (ref 26.0–34.0)
MCHC: 32.7 g/dL (ref 30.0–36.0)
MCV: 85.6 fL (ref 80.0–100.0)
Platelets: 331 10*3/uL (ref 150–400)
RBC: 4.5 MIL/uL (ref 3.87–5.11)
RDW: 14.6 % (ref 11.5–15.5)
WBC: 8 10*3/uL (ref 4.0–10.5)
nRBC: 0 % (ref 0.0–0.2)

## 2021-07-21 LAB — COMPREHENSIVE METABOLIC PANEL
ALT: 9 U/L (ref 0–44)
AST: 12 U/L — ABNORMAL LOW (ref 15–41)
Albumin: 3.4 g/dL — ABNORMAL LOW (ref 3.5–5.0)
Alkaline Phosphatase: 105 U/L (ref 38–126)
Anion gap: 7 (ref 5–15)
BUN: 11 mg/dL (ref 6–20)
CO2: 26 mmol/L (ref 22–32)
Calcium: 8.6 mg/dL — ABNORMAL LOW (ref 8.9–10.3)
Chloride: 108 mmol/L (ref 98–111)
Creatinine, Ser: 0.57 mg/dL (ref 0.44–1.00)
GFR, Estimated: >60 mL/min (ref >60)
Glucose, Bld: 89 mg/dL (ref 70–99)
Potassium: 3.6 mmol/L (ref 3.5–5.1)
Sodium: 141 mmol/L (ref 135–145)
Total Bilirubin: 0.2 mg/dL — ABNORMAL LOW (ref 0.3–1.2)
Total Protein: 6.4 g/dL — ABNORMAL LOW (ref 6.5–8.1)

## 2021-07-21 LAB — URINALYSIS, ROUTINE W REFLEX MICROSCOPIC
Bilirubin Urine: NEGATIVE
Glucose, UA: NEGATIVE mg/dL
Hgb urine dipstick: NEGATIVE
Leukocytes,Ua: NEGATIVE
Nitrite: NEGATIVE
Protein, ur: NEGATIVE mg/dL
Specific Gravity, Urine: >1.03 — ABNORMAL HIGH (ref 1.005–1.030)
pH: 5.5 (ref 5.0–8.0)

## 2021-07-21 LAB — LIPASE, BLOOD: Lipase: 42 U/L (ref 11–51)

## 2021-07-21 LAB — LITHIUM LEVEL: Lithium Lvl: 0.1 mmol/L — ABNORMAL LOW (ref 0.60–1.20)

## 2021-07-21 MED ORDER — PROMETHAZINE HCL 6.25 MG/5ML PO SYRP
ORAL_SOLUTION | ORAL | 2 refills | Status: DC
  Filled 2021-07-21: qty 100, 3d supply, fill #0
  Filled 2021-07-28: qty 100, 3d supply, fill #1
  Filled 2021-08-22: qty 100, 3d supply, fill #2

## 2021-07-21 MED ORDER — PROMETHAZINE HCL 25 MG RE SUPP
25.0000 mg | Freq: Four times a day (QID) | RECTAL | 5 refills | Status: DC | PRN
  Filled 2021-07-21: qty 12, 3d supply, fill #0
  Filled 2021-08-22: qty 12, 3d supply, fill #1
  Filled 2021-09-17: qty 6, 2d supply, fill #2

## 2021-07-21 MED ORDER — FENTANYL CITRATE PF 50 MCG/ML IJ SOSY
50.0000 ug | PREFILLED_SYRINGE | Freq: Once | INTRAMUSCULAR | Status: AC
  Administered 2021-07-21: 50 ug via INTRAVENOUS
  Filled 2021-07-21: qty 1

## 2021-07-21 MED ORDER — SODIUM CHLORIDE 0.9 % IV BOLUS
1000.0000 mL | Freq: Once | INTRAVENOUS | Status: AC
  Administered 2021-07-21: 1000 mL via INTRAVENOUS

## 2021-07-21 MED ORDER — ONDANSETRON HCL 4 MG/2ML IJ SOLN
4.0000 mg | Freq: Once | INTRAMUSCULAR | Status: AC
  Administered 2021-07-21: 4 mg via INTRAVENOUS
  Filled 2021-07-21: qty 2

## 2021-07-21 MED ORDER — ONDANSETRON HCL 4 MG PO TABS
8.0000 mg | ORAL_TABLET | Freq: Three times a day (TID) | ORAL | 2 refills | Status: DC | PRN
Start: 2021-07-21 — End: 2021-12-13
  Filled 2021-07-21: qty 30, 5d supply, fill #0

## 2021-07-21 MED ORDER — LACTATED RINGERS IV BOLUS
1000.0000 mL | Freq: Once | INTRAVENOUS | Status: AC
  Administered 2021-07-21: 1000 mL via INTRAVENOUS

## 2021-07-21 MED ORDER — PROMETHAZINE HCL 6.25 MG/5ML PO SYRP: ORAL_SOLUTION | ORAL | 2 refills | Status: DC

## 2021-07-21 NOTE — Discharge Instructions (Signed)
It is important to continue working with Dr. Redmond Pulling for resolution of your painful condition.  Continue your current treatments as prescribed.

## 2021-07-21 NOTE — ED Triage Notes (Signed)
Patient reports that she has a hiatal hernia and states her and the surgeon have been going back and forth with dealing with pain vs surgery. Patient reports vomiting "2-3 times an hour daily since September."

## 2021-07-21 NOTE — ED Provider Notes (Signed)
[facility]-EMERGENCY DEPT Provider Note   CSN: [id number] Arrival date & time: 07/21/21  1515     History  Chief Complaint  Patient presents with   Abdominal Pain    [name] [name] is a 36 y.o. female.  HPI She presents for evaluation of ongoing vomiting for 5 months, as well as diarrhea.  She occasionally sees blood in the emesis because she "vomits for several weeks."  She denies fever, cough, chest pain.  She has upper abdominal pain that she attributes to the vomiting.  She thinks that the vomiting is caused by hiatal hernia that was found on endoscopy on 07/04/2021.  She states she has been getting infusions ordered by her surgeon who follows her after gastric reconstruction surgery.  She has numerous encounters with various medical providers over the last several months.  She gets seen for a number of different things.    Home Medications Prior to Admission medications   Medication Sig Start Date End Date Taking? Authorizing Provider  promethazine (PHENERGAN) 25 MG suppository Place 1 suppository (25 mg total) rectally every 6 (six) hours as needed for nausea. 07/21/21  Yes Karie SodaGross, Steven, MD  acetaminophen (TYLENOL) 500 MG tablet Take 2 tablets (1,000 mg total) by mouth every 6 (six) hours as needed for moderate pain. 02/13/19   Fayrene Helperran, Bowie, PA-C  busPIRone (BUSPAR) 15 MG tablet Take 15 mg by mouth 3 (three) times daily. 11/01/19   [provider]  CALCIUM PO Take 1 tablet by mouth daily.    [provider]  clonazePAM (KLONOPIN) 1 MG tablet Take 0.5 tablets (0.5 mg total) by mouth 2 (two) times daily as needed for anxiety. 03/27/21   Willow OraAndy, Camille L, MD  clotrimazole (MYCELEX) 10 MG troche Take 10 mg by mouth 5 (five) times daily. 09/24/20   [provider]  cyanocobalamin (,VITAMIN B-12,) 1000 MCG/ML injection Inject 1 vial per week for 3 weeks, then 1 vial per month thereafter 06/21/20   Willow OraAndy, Camille L, MD  HYDROcodone-acetaminophen  (NORCO/VICODIN) 5-325 MG tablet Take 1 tablet by mouth every 4 (four) hours as needed. 07/17/21   Jacalyn LefevreHaviland, Julie, MD  hydrOXYzine (VISTARIL) 50 MG capsule Take 50 mg by mouth every 6 (six) hours as needed. 12/26/20   [provider]  hyoscyamine (LEVSIN) 0.125 MG tablet Take 1 tablet (0.125 mg total) by mouth every 6 (six) hours as needed for Cramping for up to 10 days 06/04/21     lamoTRIgine (LAMICTAL) 150 MG tablet Take 300 mg by mouth daily.  04/10/20   [provider]  lithium carbonate (LITHOBID) 300 MG CR tablet Take 600 mg by mouth at bedtime.    [provider]  Magnesium 500 MG TABS Take 1 tablet by mouth daily.    [provider]  metFORMIN (GLUCOPHAGE) 500 MG tablet Take 500 mg by mouth 2 (two) times daily. 11/01/19   [provider]  methocarbamol (ROBAXIN) 500 MG tablet Take 1 tablet (500 mg total) by mouth at bedtime as needed for muscle spasms. 07/12/21   Rodriguez-Southworth, Nettie ElmSylvia, PA-C  nortriptyline (PAMELOR) 10 MG capsule TAKE 1 CAPSULE BY MOUTH AT BEDTIME. 04/04/21   Kirsteins, Victorino SparrowAndrew E, MD  ondansetron (ZOFRAN) 4 MG tablet Take 2 tablets (8 mg total) by mouth every 8 (eight) hours as needed for nausea or vomiting. 07/21/21   Karie SodaGross, Steven, MD  pantoprazole (PROTONIX) 20 MG tablet TAKE 1 TABLET BY MOUTH EVERY DAY 06/21/21   Willow OraAndy, Camille L, MD  promethazine (  PHENERGAN) 6.25 MG/5ML syrup Take 10 mLs (12.5 mg total) by mouth every 6 (six) hours as needed for Nausea for up to 14 days 07/21/21   Karie Soda, MD  QUEtiapine (SEROQUEL) 400 MG tablet Take 400 mg by mouth at bedtime.  08/24/19   [provider]  QUEtiapine (SEROQUEL) 50 MG tablet Take by mouth. 06/27/20   [provider]  SYRINGE-NEEDLE, DISP, 3 ML 25G X 1" 3 ML MISC Use 1 needle per application once weekly 06/21/20   Willow Ora, MD  zolpidem (AMBIEN CR) 6.25 MG CR tablet Take 1 tablet (6.25 mg total) by mouth at bedtime as needed for up to 7 days for sleep. 07/19/21  07/26/21  Allwardt, Crist Infante, PA-C      Allergies    Nsaids, Other, and Zolpidem    Review of Systems   Review of Systems  Physical Exam Updated Vital Signs BP (!) 155/102    Pulse 78    Temp 98.2 F (36.8 C) (Oral)    Resp 16    Ht 5\' 4"  (1.626 m)    Wt 113.4 kg    LMP 06/25/2021 (Exact Date)    SpO2 100%    BMI 42.91 kg/m  Physical Exam Vitals and nursing note reviewed.  Constitutional:      General: She is not in acute distress.    Appearance: She is well-developed. She is obese. She is not ill-appearing, toxic-appearing or diaphoretic.  HENT:     Head: Normocephalic and atraumatic.     Right Ear: External ear normal.     Left Ear: External ear normal.     Nose: No congestion or rhinorrhea.     Mouth/Throat:     Mouth: Mucous membranes are moist.  Eyes:     Conjunctiva/sclera: Conjunctivae normal.     Pupils: Pupils are equal, round, and reactive to light.  Neck:     Trachea: Phonation normal.  Cardiovascular:     Rate and Rhythm: Normal rate and regular rhythm.     Heart sounds: Normal heart sounds.  Pulmonary:     Effort: Pulmonary effort is normal.     Breath sounds: Normal breath sounds.  Abdominal:     General: There is no distension.     Palpations: Abdomen is soft.     Tenderness: There is abdominal tenderness (Epigastric, mild).  Musculoskeletal:        General: Normal range of motion.     Cervical back: Normal range of motion and neck supple.  Skin:    General: Skin is warm and dry.     Coloration: Skin is not jaundiced or pale.  Neurological:     Mental Status: She is alert and oriented to person, place, and time.     Cranial Nerves: No cranial nerve deficit.     Sensory: No sensory deficit.     Motor: No abnormal muscle tone.     Coordination: Coordination normal.  Psychiatric:        Mood and Affect: Mood normal.        Behavior: Behavior normal.        Thought Content: Thought content normal.        Judgment: Judgment normal.    ED Results /  Procedures / Treatments   Labs (all labs ordered are listed, but only abnormal results are displayed) Labs Reviewed  COMPREHENSIVE METABOLIC PANEL - Abnormal; Notable for the following components:      Result Value   Calcium 8.6 (*)  Total Protein 6.4 (*)    Albumin 3.4 (*)    AST 12 (*)    Total Bilirubin 0.2 (*)    All other components within normal limits  URINALYSIS, ROUTINE W REFLEX MICROSCOPIC - Abnormal; Notable for the following components:   Color, Urine YELLOW (*)    APPearance CLEAR (*)    Specific Gravity, Urine >1.030 (*)    Ketones, ur TRACE (*)    Bacteria, UA RARE (*)    All other components within normal limits  LITHIUM LEVEL - Abnormal; Notable for the following components:   Lithium Lvl 0.10 (*)    All other components within normal limits  LIPASE, BLOOD  CBC    EKG None  Radiology No results found.  Procedures Procedures    Medications Ordered in ED Medications  ondansetron (ZOFRAN) injection 4 mg (4 mg Intravenous Given 07/21/21 1652)  sodium chloride 0.9 % bolus 1,000 mL (1,000 mLs Intravenous Transfusing/Transfer 07/22/21 0800)  fentaNYL (SUBLIMAZE) injection 50 mcg (50 mcg Intravenous Given 07/21/21 1746)  fentaNYL (SUBLIMAZE) injection 50 mcg (50 mcg Intravenous Given 07/21/21 2027)  lactated ringers bolus 1,000 mL (0 mLs Intravenous Stopped 07/21/21 2057)    ED Course/ Medical Decision Making/ A&P Clinical Course as of 07/22/21 1349  Sun Jul 21, 2021  1933 She states that her nausea is better but she still having epigastric pain.  She states for the "the pain is causing the problem." [EW]    Clinical Course User Index [EW] Mancel Bale, MD                           Medical Decision Making Patient presenting for persistent vomiting and diarrhea.  She also has numerous other ongoing problems including insomnia, depression, musculoskeletal disorders.  She is receiving frequent IV infusion of fluids, because of the vomiting.  She has been  screened for hypoalbuminemia by her bariatric surgeon who follows her closely and is ordering the infusions.  She has frequent ED visits.  She has received multiple narcotic prescriptions of various types from various providers over the last several months.  She had a recent endoscopy.  Amount and/or Complexity of Data Reviewed Independent Historian:     Details: Husband assists with some detail, specifically the frequency of her vomiting today External Data Reviewed: labs, radiology and notes.    Details: Patient had remote gastric reconstructive surgery, 2014.  This did not affect her weight.  Laboratory results, from prior visits as outpatient reviewed.  Notes from endoscopy, 2 weeks ago reviewed with signs of uncomplicated hiatal hernia.  Reconstruction anatomy is stable and without complication. Labs: ordered.    Details: CBC, metabolic panel, lipase Discussion of management or test interpretation with external provider(s): Case discussed with general surgery, Dr. Michaell Cowing.  He knows the case well and we discussed the ED findings today.  There is no indication for acute surgical intervention, hospitalization or change in current therapeutic plan.  Patient to follow-up with her bariatric surgeon for ongoing management.  Risk Prescription drug management. Decision regarding hospitalization. Risk Details: Patient with chronic symptoms, seeking evaluation in ED for same.  Nontoxic appearance, vital signs on arrival indicating mild hypertension and tachycardia.  She has been using various antibiotics including ondansetron, promethazine, and prochlorperazine for vomiting.  These only helped transiently.  Patient evaluated today for complications from ongoing vomiting and none were found.  She had improvement with the IV fluids and single dose of Zofran that was given.  She requested and received 2 doses of narcotic pain reliever.  There is no indication for prescription of outpatient narcotic pain medicine  at this time.           Final Clinical Impression(s) / ED Diagnoses Final diagnoses:  Epigastric pain    Rx / DC Orders ED Discharge Orders          Ordered    promethazine (PHENERGAN) 25 MG suppository  Every 6 hours PRN        07/21/21 2010    ondansetron (ZOFRAN) 4 MG tablet  Every 8 hours PRN        07/21/21 2010    promethazine (PHENERGAN) 6.25 MG/5ML syrup  Status:  Discontinued        07/21/21 2010    promethazine (PHENERGAN) 6.25 MG/5ML syrup        07/21/21 2012              Mancel Bale, MD 07/22/21 1350

## 2021-07-21 NOTE — ED Notes (Signed)
Pt is aware we need a urine sample but she said she is unable to go at the moment.

## 2021-07-22 ENCOUNTER — Encounter: Payer: Self-pay | Admitting: Family Medicine

## 2021-07-22 ENCOUNTER — Other Ambulatory Visit (HOSPITAL_BASED_OUTPATIENT_CLINIC_OR_DEPARTMENT_OTHER): Payer: Self-pay

## 2021-07-22 DIAGNOSIS — F3181 Bipolar II disorder: Secondary | ICD-10-CM | POA: Diagnosis not present

## 2021-07-22 DIAGNOSIS — F1910 Other psychoactive substance abuse, uncomplicated: Secondary | ICD-10-CM | POA: Diagnosis not present

## 2021-07-24 ENCOUNTER — Other Ambulatory Visit: Payer: Self-pay

## 2021-07-24 ENCOUNTER — Telehealth: Payer: Self-pay

## 2021-07-24 NOTE — Telephone Encounter (Signed)
Last refill: 03/27/21 #60, 1 Last OV: 07/19/21 w/ Alyssa dx. Insomnia                 03/06/21 dx. CPE

## 2021-07-24 NOTE — Telephone Encounter (Signed)
..   Encourage patient to contact the pharmacy for refills or they can request refills through [facility]  LAST APPOINTMENT DATE:   NEXT APPOINTMENT DATE: 09/04/21  MEDICATION:  klonopin   Is the patient out of medication?   PHARMACY:  [facility]  Let patient know to contact pharmacy at the end of the day to make sure medication is ready.  Please notify patient to allow 48-72 hours to process  CLINICAL FILLS OUT ALL BELOW:   LAST REFILL:  QTY:  REFILL DATE:    OTHER COMMENTS:    Okay for refill?  Please advise

## 2021-07-24 NOTE — Telephone Encounter (Signed)
Refill request sent to PCP.

## 2021-07-25 ENCOUNTER — Other Ambulatory Visit (HOSPITAL_BASED_OUTPATIENT_CLINIC_OR_DEPARTMENT_OTHER): Payer: Self-pay

## 2021-07-25 DIAGNOSIS — Z9884 Bariatric surgery status: Secondary | ICD-10-CM | POA: Diagnosis not present

## 2021-07-25 DIAGNOSIS — K449 Diaphragmatic hernia without obstruction or gangrene: Secondary | ICD-10-CM | POA: Diagnosis not present

## 2021-07-25 DIAGNOSIS — R112 Nausea with vomiting, unspecified: Secondary | ICD-10-CM | POA: Diagnosis not present

## 2021-07-25 DIAGNOSIS — R1013 Epigastric pain: Secondary | ICD-10-CM | POA: Diagnosis not present

## 2021-07-25 MED ORDER — TRAMADOL HCL 50 MG PO TABS
ORAL_TABLET | ORAL | 0 refills | Status: DC
Start: 2021-07-25 — End: 2021-08-11
  Filled 2021-07-25: qty 9, 3d supply, fill #0
  Filled 2021-07-25: qty 6, 2d supply, fill #0

## 2021-07-26 ENCOUNTER — Ambulatory Visit: Payer: BC Managed Care – PPO | Admitting: Family Medicine

## 2021-07-29 ENCOUNTER — Other Ambulatory Visit (HOSPITAL_BASED_OUTPATIENT_CLINIC_OR_DEPARTMENT_OTHER): Payer: Self-pay

## 2021-08-06 ENCOUNTER — Other Ambulatory Visit: Payer: Self-pay | Admitting: Family Medicine

## 2021-08-07 ENCOUNTER — Encounter: Payer: Self-pay | Admitting: Family Medicine

## 2021-08-07 NOTE — Telephone Encounter (Signed)
Spoke with patient, Let her know to contact psychiatrist for further refills.

## 2021-08-11 ENCOUNTER — Emergency Department (HOSPITAL_BASED_OUTPATIENT_CLINIC_OR_DEPARTMENT_OTHER): Payer: BC Managed Care – PPO

## 2021-08-11 ENCOUNTER — Encounter (HOSPITAL_BASED_OUTPATIENT_CLINIC_OR_DEPARTMENT_OTHER): Payer: Self-pay | Admitting: Emergency Medicine

## 2021-08-11 ENCOUNTER — Emergency Department (HOSPITAL_BASED_OUTPATIENT_CLINIC_OR_DEPARTMENT_OTHER)
Admission: EM | Admit: 2021-08-11 | Discharge: 2021-08-11 | Disposition: A | Discharge status: Home / Self Care | Payer: BC Managed Care – PPO | Attending: Emergency Medicine | Admitting: Emergency Medicine

## 2021-08-11 ENCOUNTER — Other Ambulatory Visit: Payer: Self-pay

## 2021-08-11 DIAGNOSIS — R109 Unspecified abdominal pain: Secondary | ICD-10-CM | POA: Diagnosis not present

## 2021-08-11 DIAGNOSIS — R1013 Epigastric pain: Secondary | ICD-10-CM

## 2021-08-11 DIAGNOSIS — I10 Essential (primary) hypertension: Secondary | ICD-10-CM | POA: Diagnosis not present

## 2021-08-11 DIAGNOSIS — R112 Nausea with vomiting, unspecified: Secondary | ICD-10-CM

## 2021-08-11 DIAGNOSIS — K449 Diaphragmatic hernia without obstruction or gangrene: Secondary | ICD-10-CM | POA: Insufficient documentation

## 2021-08-11 LAB — COMPREHENSIVE METABOLIC PANEL
ALT: 8 U/L (ref 0–44)
AST: 11 U/L — ABNORMAL LOW (ref 15–41)
Albumin: 4.2 g/dL (ref 3.5–5.0)
Alkaline Phosphatase: 105 U/L (ref 38–126)
Anion gap: 10 (ref 5–15)
BUN: 14 mg/dL (ref 6–20)
CO2: 26 mmol/L (ref 22–32)
Calcium: 9.3 mg/dL (ref 8.9–10.3)
Chloride: 104 mmol/L (ref 98–111)
Creatinine, Ser: 0.78 mg/dL (ref 0.44–1.00)
GFR, Estimated: >60 mL/min (ref >60)
Glucose, Bld: 77 mg/dL (ref 70–99)
Potassium: 3.7 mmol/L (ref 3.5–5.1)
Sodium: 140 mmol/L (ref 135–145)
Total Bilirubin: 0.3 mg/dL (ref 0.3–1.2)
Total Protein: 7.1 g/dL (ref 6.5–8.1)

## 2021-08-11 LAB — CBC
HCT: 40.9 % (ref 36.0–46.0)
Hemoglobin: 13.2 g/dL (ref 12.0–15.0)
MCH: 27.7 pg (ref 26.0–34.0)
MCHC: 32.3 g/dL (ref 30.0–36.0)
MCV: 85.9 fL (ref 80.0–100.0)
Platelets: 319 10*3/uL (ref 150–400)
RBC: 4.76 MIL/uL (ref 3.87–5.11)
RDW: 14.5 % (ref 11.5–15.5)
WBC: 8.7 10*3/uL (ref 4.0–10.5)
nRBC: 0 % (ref 0.0–0.2)

## 2021-08-11 LAB — URINALYSIS, ROUTINE W REFLEX MICROSCOPIC
Bilirubin Urine: NEGATIVE
Glucose, UA: NEGATIVE mg/dL
Hgb urine dipstick: NEGATIVE
Ketones, ur: NEGATIVE mg/dL
Nitrite: NEGATIVE
Specific Gravity, Urine: 1.027 (ref 1.005–1.030)
pH: 5 (ref 5.0–8.0)

## 2021-08-11 LAB — PREGNANCY, URINE: Preg Test, Ur: NEGATIVE

## 2021-08-11 LAB — LIPASE, BLOOD: Lipase: 24 U/L (ref 11–51)

## 2021-08-11 IMAGING — CT CT ABD-PELV W/ CM
2 of 4 series · 17 of 46 positions shown, 19 images · IV contrast (APPLIED)
Comparison: [DATE]

CLINICAL DATA: Epigastric pain.  Emesis over the last 2 weeks.

EXAM:
CT ABDOMEN AND PELVIS WITH CONTRAST
TECHNIQUE: Multidetector CT imaging of the abdomen and pelvis was performed
using the standard protocol following bolus administration of
intravenous contrast.

[Series 2: abd pel w · axial · 0.95mm/px · z∈[-520,-80]mm · 14 of 96 slices shown, 16 images]
[im 4/96  soft-tissue]
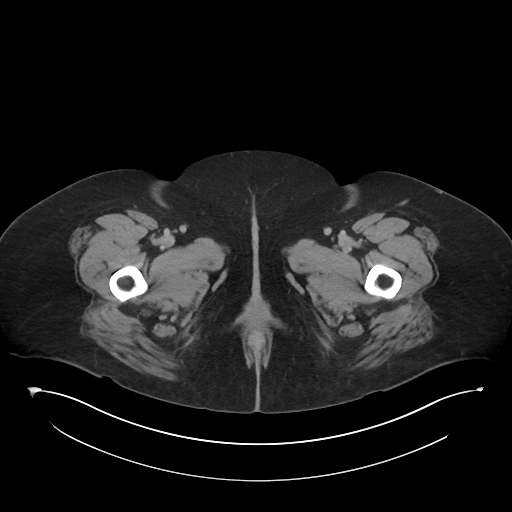
[im 4/96  bone]
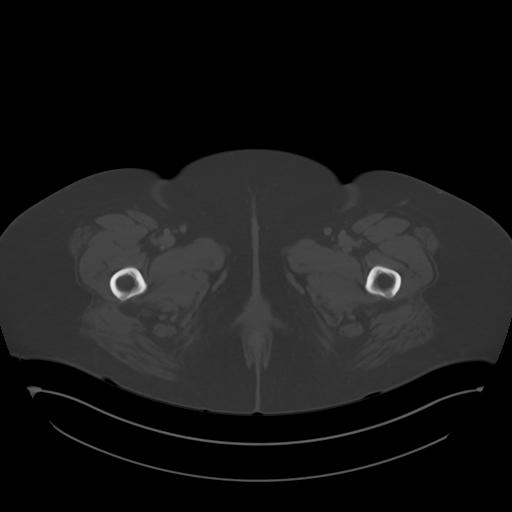
[im 12/96  soft-tissue]
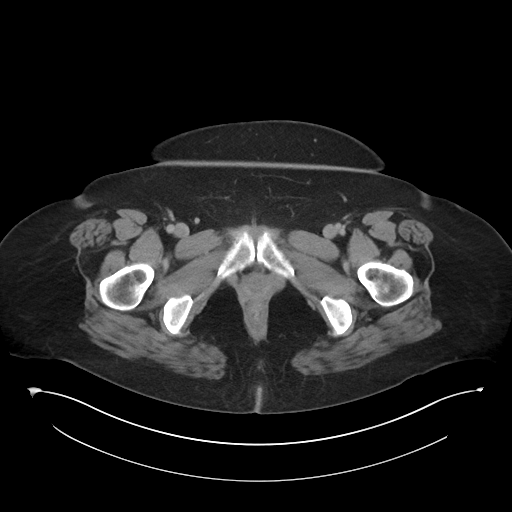
[im 20/96  soft-tissue]
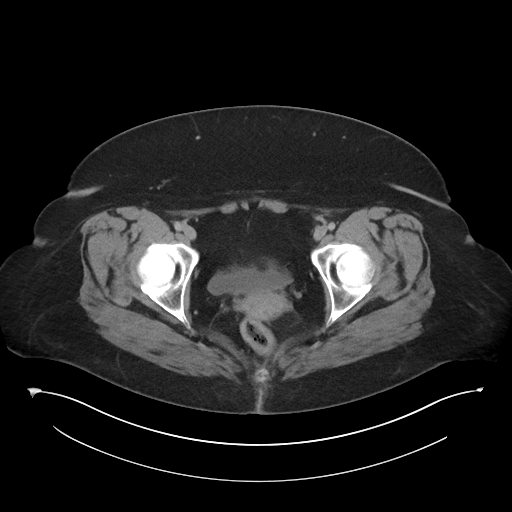
[im 27/96  soft-tissue]
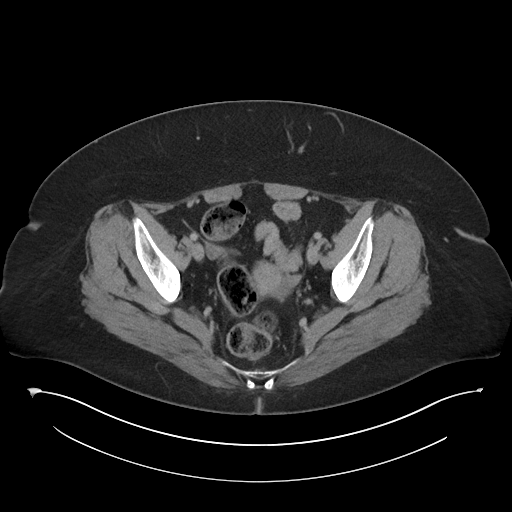
[im 31/96  soft-tissue]
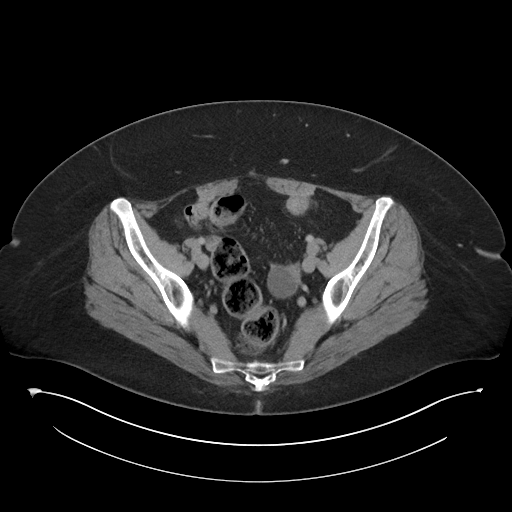
[im 39/96  soft-tissue]
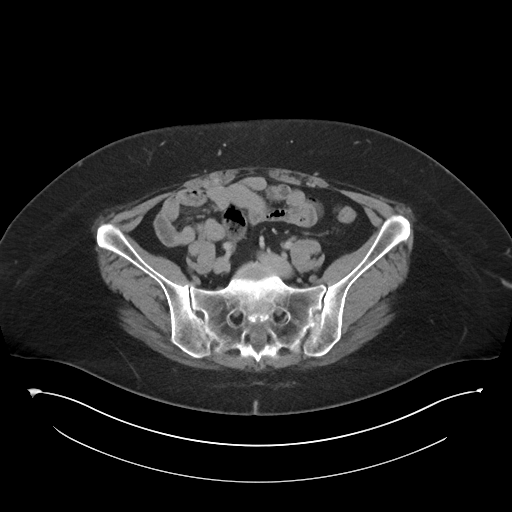
[im 46/96  soft-tissue]
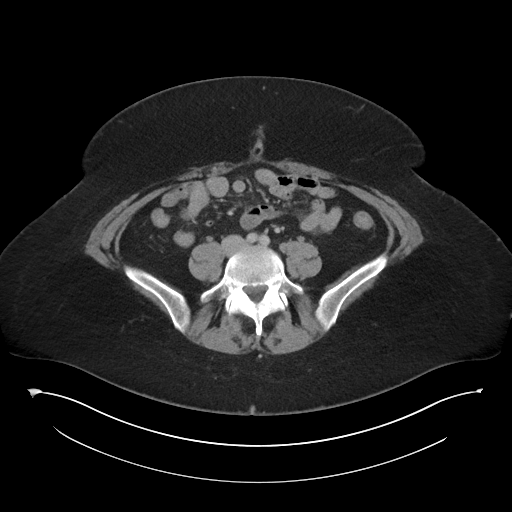
[im 50/96  soft-tissue]
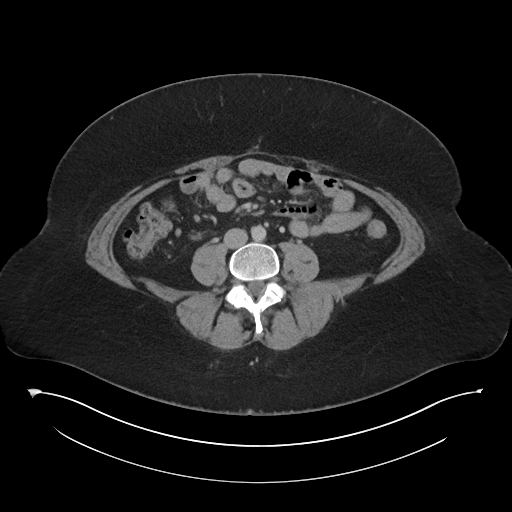
[im 58/96  soft-tissue]
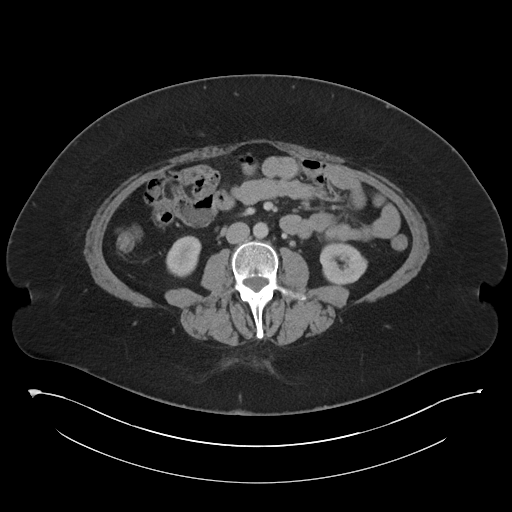
[im 58/96  bone]
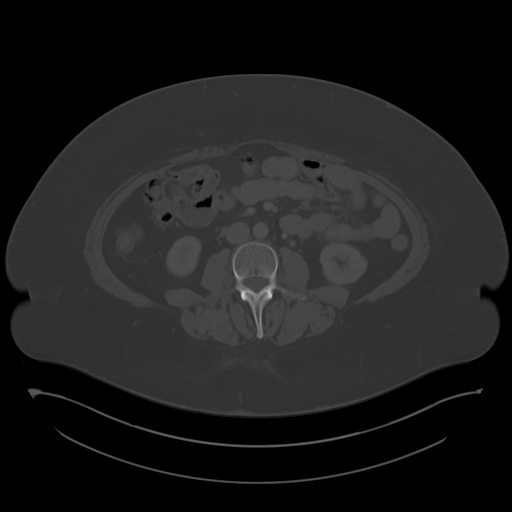
[im 65/96  soft-tissue]
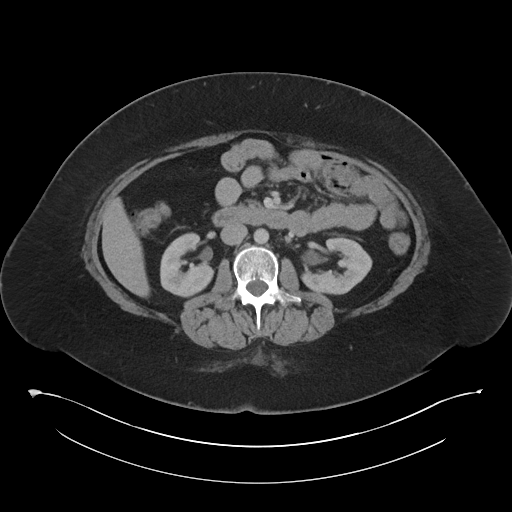
[im 73/96  soft-tissue]
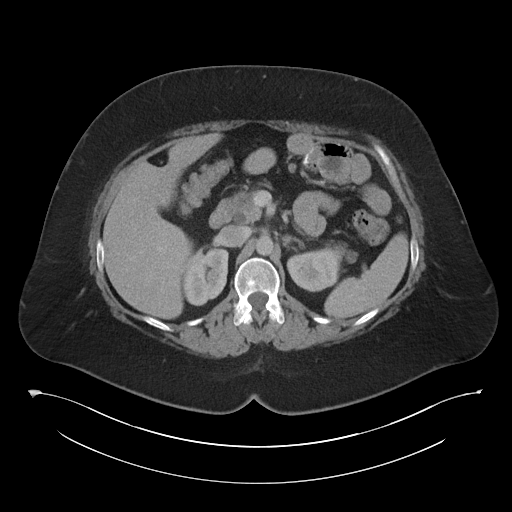
[im 77/96  soft-tissue]
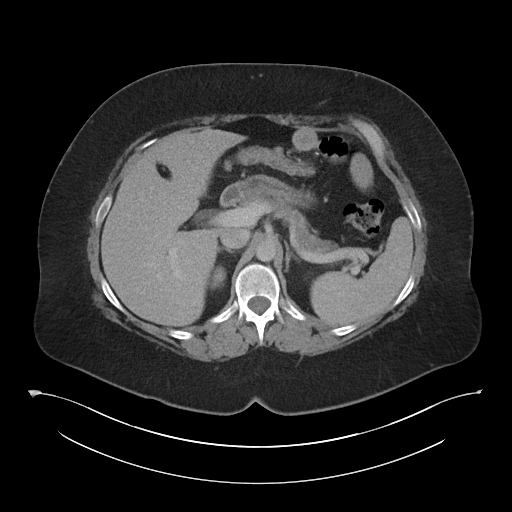
[im 84/96  soft-tissue]
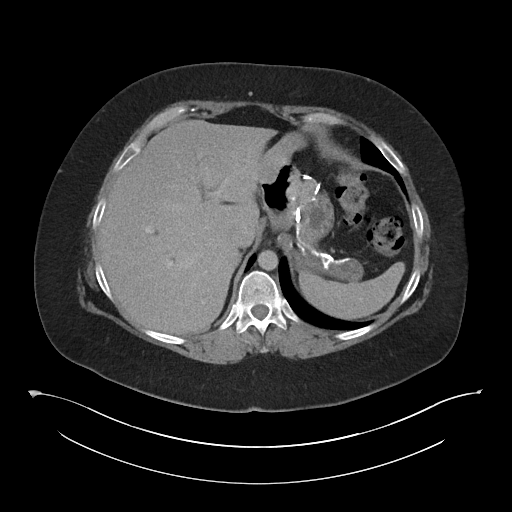
[im 92/96  soft-tissue]
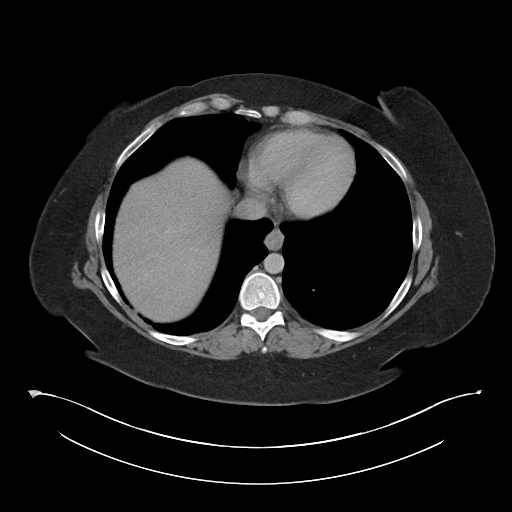

[Series 5: coronal · coronal · 0.88mm/px · 3 of 113 slices shown]
[im 38/113  soft-tissue]
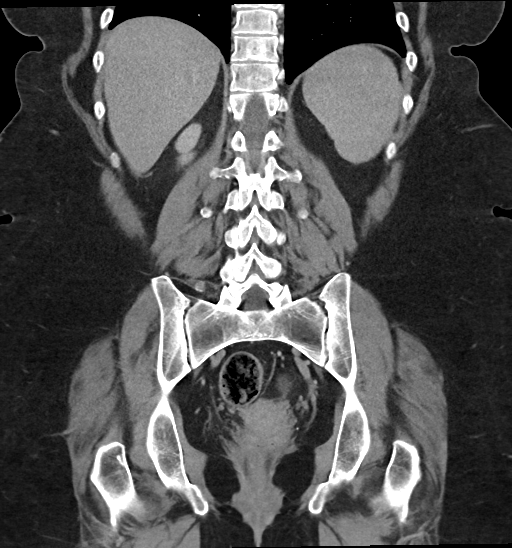
[im 50/113  soft-tissue]
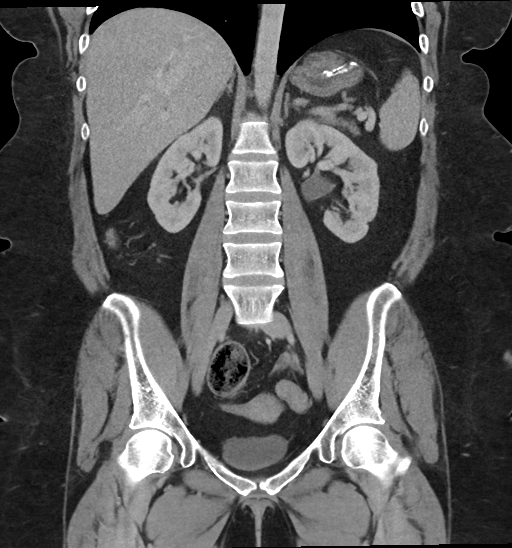
[im 63/113  soft-tissue]
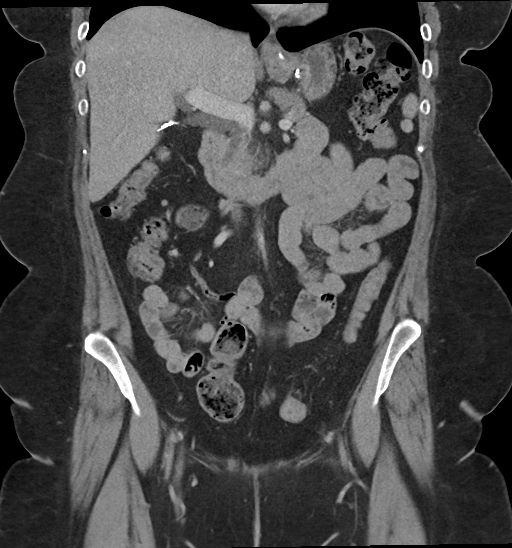

[17 of 46 positions shown; findings below may reference images not displayed]

RADIATION DOSE REDUCTION: This exam was performed according to the
departmental dose-optimization program which includes automated
exposure control, adjustment of the mA and/or kV according to
patient size and/or use of iterative reconstruction technique.

CONTRAST:  100mL OMNIPAQUE IOHEXOL 300 MG/ML  SOLN
FINDINGS: Lower chest: Lung bases are clear. No pleural or pericardial fluid.
Small hiatal hernia.

Hepatobiliary: Previous cholecystectomy. Mild biliary ductal
prominence, often seen following cholecystectomy. No focal liver
finding.

Pancreas: Normal

Spleen: Normal

Adrenals/Urinary Tract: Adrenal glands are normal. Kidneys are
normal. No cyst, mass, stone or hydronephrosis. Bladder is normal.

Stomach/Bowel: Previous gastric bariatric surgery. Small bowel
bariatric surgical changes without complicating feature. Colon is
normal.

Vascular/Lymphatic: Aorta and IVC are normal.  No adenopathy.

Reproductive: Normal.  No pelvic mass.

Other: No free fluid or air.

Musculoskeletal: No significant lumbar degenerative changes. Chronic
benign sclerotic focus within the left iliac bone.
IMPRESSION: No abnormality seen to explain the clinical presentation. Previous
gastric bariatric surgery. No complicating features seen. The
patient does have a small hiatal hernia.

Previous cholecystectomy. Mild biliary ductal prominence, typical
following cholecystectomy.

## 2021-08-11 MED ORDER — LACTATED RINGERS IV BOLUS
1000.0000 mL | Freq: Once | INTRAVENOUS | Status: AC
  Administered 2021-08-11: 1000 mL via INTRAVENOUS

## 2021-08-11 MED ORDER — IOHEXOL 300 MG/ML  SOLN
100.0000 mL | Freq: Once | INTRAMUSCULAR | Status: AC | PRN
  Administered 2021-08-11: 100 mL via INTRAVENOUS

## 2021-08-11 MED ORDER — OXYCODONE-ACETAMINOPHEN 5-325 MG PO TABS: 1.0000 | ORAL_TABLET | Freq: Three times a day (TID) | ORAL | 0 refills | Status: AC | PRN

## 2021-08-11 MED ORDER — MORPHINE SULFATE (PF) 4 MG/ML IV SOLN
4.0000 mg | Freq: Once | INTRAVENOUS | Status: AC
Start: 2021-08-11 — End: 2021-08-11
  Administered 2021-08-11: 4 mg via INTRAVENOUS
  Filled 2021-08-11: qty 1

## 2021-08-11 MED ORDER — HYOSCYAMINE SULFATE 0.125 MG PO TABS: ORAL_TABLET | ORAL | 0 refills | Status: DC

## 2021-08-11 MED ORDER — SODIUM CHLORIDE 0.9 % IV SOLN
12.5000 mg | Freq: Four times a day (QID) | INTRAVENOUS | Status: DC | PRN
  Administered 2021-08-11: 12.5 mg via INTRAVENOUS
  Filled 2021-08-11: qty 0.5

## 2021-08-11 NOTE — Discharge Instructions (Addendum)
You came to the emergency department today to be evaluated for your nausea, vomiting, and abdominal pain.  Your physical exam and lab work are reassuring.  The CT scan of your abdomen pelvis showed a small hiatal hernia however no changes or other acute abdominal injury.  Please continue to take your antinausea medication as prescribed.  I have given you a refill of your Levsin medication.  Additionally have given you a short course of pain medication to be used for severe breakthrough pain.  Please follow-up closely with your general surgeon Dr. Redmond Pulling for further management of your hiatal hernia.  Today you received medications that may make you sleepy or impair your ability to make decisions.  For the next 24 hours please do not drive, operate heavy machinery, care for a small child with out another adult present, or perform any activities that may cause harm to you or someone else if you were to fall asleep or be impaired.   You are being prescribed a medication which may make you sleepy. Please follow up of listed precautions for at least 24 hours after taking one dose.   Get help right away if: Your pain is getting worse. Your pain spreads to your arms, neck, jaw, teeth, or back. You have shortness of breath. You sweat for no reason. You feel sick to your stomach (nauseous) or you vomit. You vomit blood. You have bright red blood in your stools. You have black, tarry stools.

## 2021-08-11 NOTE — ED Notes (Signed)
RT Note: Labs attempted X2-L wrist, L A/C was unable to obtain adequate # for labs, Triage RN made aware.

## 2021-08-11 NOTE — ED Triage Notes (Signed)
Emesis x 2 weeks , Hx hernia , no relief with home meds

## 2021-08-11 NOTE — ED Notes (Signed)
Patient transported to CT 

## 2021-08-11 NOTE — ED Provider Notes (Signed)
[facility] EMERGENCY DEPT Provider Note   CSN: [id number] Arrival date & time: 08/11/21  1732     History  Chief Complaint  Patient presents with   Emesis    [name] [name] is a 35 y.o. female with a past medical history of hypertension, GERD, hiatal hernia, status post gastric Roux-en-Y, status post cholecystectomy, status post colon resection, bipolar 2 disorder.  Presents to the emergency department with epigastric pain, nausea, and vomiting.  Patient reports that she has had epigastric pain over the last 3 days.  Pain radiates to left upper quadrant.  Patient rates pain 8/10 on the pain scale.  Pain is worse with eating and vomiting.  Patient reports that pain feels similar to previous episodes she has had with her hiatal hernia except that radiation to left upper quadrant is new.  Previously patient has been able to take Tylenol to manage her pain however over the last 3 days she has not been able to.  Patient reports increased nausea and vomiting.  States that she is vomiting after p.o. intake.  Patient reports she has vomited 8-10 times in the last 24 hours.  Describes emesis as stomach contents.  Emesis is nonbloody and nonbilious.  Patient has tried at home Zofran with no relief of symptoms.  Patient reports that she is followed by Dr. Andrey Campanile with [facility] surgery for her hiatal hernia.  Patient has plan for surgery once her medical insurance is renewed.  Patient reports that she contacted currently central surgery and was advised to come to the emergency department for further evaluation.  Patient denies any fevers, chills, blood in stool, melena, dysuria, hematuria, urinary urgency, flank pain, vaginal pain, vaginal bleeding, vaginal discharge.  Patient denies any NSAID use, frequent alcohol use, or illicit drug use.   Emesis Associated symptoms: abdominal pain   Associated symptoms: no chills, no diarrhea, no fever and no headaches       Home  Medications Prior to Admission medications   Medication Sig Start Date End Date Taking? Authorizing Provider  acetaminophen (TYLENOL) 500 MG tablet Take 2 tablets (1,000 mg total) by mouth every 6 (six) hours as needed for moderate pain. 02/13/19   Fayrene Helper, PA-C  busPIRone (BUSPAR) 15 MG tablet Take 15 mg by mouth 3 (three) times daily. 11/01/19   [provider]  CALCIUM PO Take 1 tablet by mouth daily.    [provider]  clonazePAM (KLONOPIN) 1 MG tablet Take 0.5 tablets (0.5 mg total) by mouth 2 (two) times daily as needed for anxiety. 03/27/21   Willow Ora, MD  clotrimazole (MYCELEX) 10 MG troche Take 10 mg by mouth 5 (five) times daily. 09/24/20   [provider]  cyanocobalamin (,VITAMIN B-12,) 1000 MCG/ML injection Inject 1 vial per week for 3 weeks, then 1 vial per month thereafter 06/21/20   Willow Ora, MD  HYDROcodone-acetaminophen (NORCO/VICODIN) 5-325 MG tablet Take 1 tablet by mouth every 4 (four) hours as needed. 07/17/21   Jacalyn Lefevre, MD  hydrOXYzine (VISTARIL) 50 MG capsule Take 50 mg by mouth every 6 (six) hours as needed. 12/26/20   [provider]  hyoscyamine (LEVSIN) 0.125 MG tablet Take 1 tablet (0.125 mg total) by mouth every 6 (six) hours as needed for Cramping for up to 10 days 06/04/21     lamoTRIgine (LAMICTAL) 150 MG tablet Take 300 mg by mouth daily.  04/10/20   [provider]  lithium carbonate (LITHOBID) 300 MG CR tablet Take 600  mg by mouth at bedtime.    [provider]  Magnesium 500 MG TABS Take 1 tablet by mouth daily.    [provider]  metFORMIN (GLUCOPHAGE) 500 MG tablet Take 500 mg by mouth 2 (two) times daily. 11/01/19   [provider]  methocarbamol (ROBAXIN) 500 MG tablet Take 1 tablet (500 mg total) by mouth at bedtime as needed for muscle spasms. 07/12/21   Rodriguez-Southworth, Nettie Elm, PA-C  nortriptyline (PAMELOR) 10 MG capsule TAKE 1 CAPSULE BY MOUTH AT BEDTIME.  04/04/21   Kirsteins, Victorino Sparrow, MD  ondansetron (ZOFRAN) 4 MG tablet Take 2 tablets (8 mg total) by mouth every 8 (eight) hours as needed for nausea or vomiting. 07/21/21   Karie Soda, MD  pantoprazole (PROTONIX) 20 MG tablet TAKE 1 TABLET BY MOUTH EVERY DAY 06/21/21   Willow Ora, MD  promethazine (PHENERGAN) 25 MG suppository Place 1 suppository (25 mg total) rectally every 6 (six) hours as needed for nausea. 07/21/21   Karie Soda, MD  promethazine (PHENERGAN) 6.25 MG/5ML syrup Take 10 mLs (12.5 mg total) by mouth every 6 (six) hours as needed for Nausea for up to 14 days 07/21/21   Karie Soda, MD  QUEtiapine (SEROQUEL) 400 MG tablet Take 400 mg by mouth at bedtime.  08/24/19   [provider]  QUEtiapine (SEROQUEL) 50 MG tablet Take by mouth. 06/27/20   [provider]  SYRINGE-NEEDLE, DISP, 3 ML 25G X 1" 3 ML MISC Use 1 needle per application once weekly 06/21/20   Willow Ora, MD  traMADol (ULTRAM) 50 MG tablet Take 1 tablet (50 mg total) by mouth every 6 (six) hours as needed for pain for up to 5 days. 07/25/21     zolpidem (AMBIEN CR) 6.25 MG CR tablet Take 1 tablet (6.25 mg total) by mouth at bedtime as needed for up to 7 days for sleep. 07/19/21 07/26/21  Allwardt, Crist Infante, PA-C      Allergies    Nsaids, Other, and Zolpidem    Review of Systems   Review of Systems  Constitutional:  Negative for chills and fever.  Respiratory:  Negative for shortness of breath.   Cardiovascular:  Negative for chest pain.  Gastrointestinal:  Positive for abdominal pain, nausea and vomiting. Negative for abdominal distention, anal bleeding, blood in stool, constipation, diarrhea and rectal pain.  Genitourinary:  Negative for difficulty urinating, dysuria, frequency, genital sores, hematuria, pelvic pain, urgency, vaginal bleeding, vaginal discharge and vaginal pain.  Musculoskeletal:  Negative for back pain and neck pain.  Skin:  Negative for color change and rash.  Neurological:   Negative for dizziness, syncope, light-headedness and headaches.  Psychiatric/Behavioral:  Negative for confusion.    Physical Exam Updated Vital Signs BP 129/84    Pulse 85    Temp 98.6 F (37 C)    Resp 20    Wt 113 kg    SpO2 98%    BMI 42.76 kg/m  Physical Exam Vitals and nursing note reviewed.  Constitutional:      General: She is not in acute distress.    Appearance: She is not ill-appearing, toxic-appearing or diaphoretic.  HENT:     Head: Normocephalic.  Eyes:     General: No scleral icterus.       Right eye: No discharge.        Left eye: No discharge.  Cardiovascular:     Rate and Rhythm: Normal rate.  Pulmonary:     Effort: Pulmonary effort is  normal.  Abdominal:     General: Abdomen is protuberant. A surgical scar is present. Bowel sounds are normal. There is no distension. There are no signs of injury.     Palpations: Abdomen is soft. There is no mass or pulsatile mass.     Tenderness: There is abdominal tenderness in the epigastric area and left upper quadrant. There is no right CVA tenderness, left CVA tenderness, guarding or rebound.     Hernia: There is no hernia in the umbilical area or ventral area.  Skin:    General: Skin is warm and dry.  Neurological:     General: No focal deficit present.     Mental Status: She is alert.     GCS: GCS eye subscore is 4. GCS verbal subscore is 5. GCS motor subscore is 6.  Psychiatric:        Behavior: Behavior is cooperative.    ED Results / Procedures / Treatments   Labs (all labs ordered are listed, but only abnormal results are displayed) Labs Reviewed  COMPREHENSIVE METABOLIC PANEL - Abnormal; Notable for the following components:      Result Value   AST 11 (*)    All other components within normal limits  URINALYSIS, ROUTINE W REFLEX MICROSCOPIC - Abnormal; Notable for the following components:   Protein, ur TRACE (*)    Leukocytes,Ua SMALL (*)    Bacteria, UA FEW (*)    All other components within normal  limits  LIPASE, BLOOD  CBC  PREGNANCY, URINE    EKG None  Radiology CT ABDOMEN PELVIS W CONTRAST  Result Date: 08/11/2021 CLINICAL DATA:  Epigastric pain.  Emesis over the last 2 weeks. EXAM: CT ABDOMEN AND PELVIS WITH CONTRAST TECHNIQUE: Multidetector CT imaging of the abdomen and pelvis was performed using the standard protocol following bolus administration of intravenous contrast. RADIATION DOSE REDUCTION: This exam was performed according to the departmental dose-optimization program which includes automated exposure control, adjustment of the mA and/or kV according to patient size and/or use of iterative reconstruction technique. CONTRAST:  185mL OMNIPAQUE IOHEXOL 300 MG/ML  SOLN COMPARISON:  06/19/2021 FINDINGS: Lower chest: Lung bases are clear. No pleural or pericardial fluid. Small hiatal hernia. Hepatobiliary: Previous cholecystectomy. Mild biliary ductal prominence, often seen following cholecystectomy. No focal liver finding. Pancreas: Normal Spleen: Normal Adrenals/Urinary Tract: Adrenal glands are normal. Kidneys are normal. No cyst, mass, stone or hydronephrosis. Bladder is normal. Stomach/Bowel: Previous gastric bariatric surgery. Small bowel bariatric surgical changes without complicating feature. Colon is normal. Vascular/Lymphatic: Aorta and IVC are normal.  No adenopathy. Reproductive: Normal.  No pelvic mass. Other: No free fluid or air. Musculoskeletal: No significant lumbar degenerative changes. Chronic benign sclerotic focus within the left iliac bone. IMPRESSION: No abnormality seen to explain the clinical presentation. Previous gastric bariatric surgery. No complicating features seen. The patient does have a small hiatal hernia. Previous cholecystectomy. Mild biliary ductal prominence, typical following cholecystectomy. Electronically Signed   By: Nelson Chimes M.D.   On: 08/11/2021 20:27    Procedures Procedures    Medications Ordered in ED Medications  promethazine  (PHENERGAN) 12.5 mg in sodium chloride 0.9 % 50 mL IVPB (0 mg Intravenous Stopped 08/11/21 2040)  morphine (PF) 4 MG/ML injection 4 mg (4 mg Intravenous Given 08/11/21 2002)  lactated ringers bolus 1,000 mL (0 mLs Intravenous Stopped 08/11/21 2149)  iohexol (OMNIPAQUE) 300 MG/ML solution 100 mL (100 mLs Intravenous Contrast Given 08/11/21 2011)  morphine (PF) 4 MG/ML injection 4 mg (4 mg Intravenous  Given 08/11/21 2128)    ED Course/ Medical Decision Making/ A&P                           Medical Decision Making Amount and/or Complexity of Data Reviewed Labs: ordered. Radiology: ordered.  Risk Prescription drug management.   Alert 36 year old female in no acute distress, nontoxic-appearing.  Presents emergency department with a complaint of epigastric pain, nausea, and vomiting.  Information was obtained from patient.  Past medical records reviewed including previous provider notes, labs, and imaging.  Patient has past medical history as outlined in HPI which complicates her care.  On physical exam abdomen soft, nondistended, tenderness to epigastric area and left upper quadrant.  No guarding or rebound tenderness.  With known hiatal hernia and change in pain pattern concern for possible complication.  Will obtain CT abdomen pelvis for further evaluation.  Patient given morphine for pain management as well as Phenergan for control of nausea and vomiting.  Lab work was independently interpreted by myself.  Pertinent findings include: -CMP, CBC, lipase unremarkable -Urine pregnancy test negative -UA shows bacteria few, leukocyte small, nitrite negative, ketones negative.  CT abdomen pelvis shows small hiatal hernia, previous gastric bariatric surgery, previous cholecystectomy.  No acute abnormalities.  Patient reports improvement in pain after receiving morphine.  On serial repeat examination abdomen remains soft, nondistended with improvement in tenderness.  Patient able to tolerate p.o.  intake without vomiting after receiving Phenergan.  Patient reports that she has prescription for Phenergan suppositories.  Will prescribe patient with short course of Percocet to help with pain management.  Patient is requesting refill of hyoscyamine as she reports that this medication helps reduce nausea and vomiting with p.o. intake.  We will refill this medication.  Patient will follow-up with her general surgeon in the outpatient setting.  Discussed results, findings, treatment and follow up. Patient advised of return precautions. Patient verbalized understanding and agreed with plan.         Final Clinical Impression(s) / ED Diagnoses Final diagnoses:  None    Rx / DC Orders ED Discharge Orders          Ordered    oxyCODONE-acetaminophen (PERCOCET/ROXICET) 5-325 MG tablet  Every 8 hours PRN        08/11/21 2228    hyoscyamine (LEVSIN) 0.125 MG tablet        08/11/21 2228              Dyann Ruddle 08/12/21 0116    Tegeler, Gwenyth Allegra, MD 08/12/21 2024

## 2021-08-11 NOTE — ED Notes (Signed)
RT Note: U/A obtained by pt., labelled/sent to lab.

## 2021-08-21 ENCOUNTER — Encounter (HOSPITAL_BASED_OUTPATIENT_CLINIC_OR_DEPARTMENT_OTHER): Payer: Self-pay

## 2021-08-21 ENCOUNTER — Emergency Department (HOSPITAL_BASED_OUTPATIENT_CLINIC_OR_DEPARTMENT_OTHER): Payer: 59

## 2021-08-21 ENCOUNTER — Other Ambulatory Visit: Payer: Self-pay

## 2021-08-21 ENCOUNTER — Emergency Department (HOSPITAL_BASED_OUTPATIENT_CLINIC_OR_DEPARTMENT_OTHER)
Admission: EM | Admit: 2021-08-21 | Discharge: 2021-08-21 | Disposition: A | Discharge status: Home / Self Care | Payer: 59 | Attending: Emergency Medicine | Admitting: Emergency Medicine

## 2021-08-21 DIAGNOSIS — I10 Essential (primary) hypertension: Secondary | ICD-10-CM | POA: Diagnosis not present

## 2021-08-21 DIAGNOSIS — N9489 Other specified conditions associated with female genital organs and menstrual cycle: Secondary | ICD-10-CM | POA: Insufficient documentation

## 2021-08-21 DIAGNOSIS — R112 Nausea with vomiting, unspecified: Secondary | ICD-10-CM | POA: Diagnosis not present

## 2021-08-21 DIAGNOSIS — R197 Diarrhea, unspecified: Secondary | ICD-10-CM | POA: Diagnosis not present

## 2021-08-21 DIAGNOSIS — R1012 Left upper quadrant pain: Secondary | ICD-10-CM | POA: Diagnosis not present

## 2021-08-21 DIAGNOSIS — R1013 Epigastric pain: Secondary | ICD-10-CM | POA: Insufficient documentation

## 2021-08-21 DIAGNOSIS — R109 Unspecified abdominal pain: Secondary | ICD-10-CM

## 2021-08-21 LAB — URINALYSIS, ROUTINE W REFLEX MICROSCOPIC
Bilirubin Urine: NEGATIVE
Glucose, UA: NEGATIVE mg/dL
Hgb urine dipstick: NEGATIVE
Ketones, ur: NEGATIVE mg/dL
Leukocytes,Ua: NEGATIVE
Nitrite: NEGATIVE
Specific Gravity, Urine: 1.022 (ref 1.005–1.030)
pH: 6 (ref 5.0–8.0)

## 2021-08-21 LAB — CBC WITH DIFFERENTIAL/PLATELET
Abs Immature Granulocytes: 0.03 10*3/uL (ref 0.00–0.07)
Basophils Absolute: 0 10*3/uL (ref 0.0–0.1)
Basophils Relative: 0 %
Eosinophils Absolute: 0.1 10*3/uL (ref 0.0–0.5)
Eosinophils Relative: 1 %
HCT: 39.4 % (ref 36.0–46.0)
Hemoglobin: 12.8 g/dL (ref 12.0–15.0)
Immature Granulocytes: 0 %
Lymphocytes Relative: 19 %
Lymphs Abs: 2 10*3/uL (ref 0.7–4.0)
MCH: 27.5 pg (ref 26.0–34.0)
MCHC: 32.5 g/dL (ref 30.0–36.0)
MCV: 84.7 fL (ref 80.0–100.0)
Monocytes Absolute: 0.6 10*3/uL (ref 0.1–1.0)
Monocytes Relative: 6 %
Neutro Abs: 7.3 10*3/uL (ref 1.7–7.7)
Neutrophils Relative %: 74 %
Platelets: 343 10*3/uL (ref 150–400)
RBC: 4.65 MIL/uL (ref 3.87–5.11)
RDW: 14.6 % (ref 11.5–15.5)
WBC: 10.1 10*3/uL (ref 4.0–10.5)
nRBC: 0 % (ref 0.0–0.2)

## 2021-08-21 LAB — COMPREHENSIVE METABOLIC PANEL
ALT: 9 U/L (ref 0–44)
AST: 11 U/L — ABNORMAL LOW (ref 15–41)
Albumin: 4.4 g/dL (ref 3.5–5.0)
Alkaline Phosphatase: 117 U/L (ref 38–126)
Anion gap: 11 (ref 5–15)
BUN: 12 mg/dL (ref 6–20)
CO2: 24 mmol/L (ref 22–32)
Calcium: 9.2 mg/dL (ref 8.9–10.3)
Chloride: 105 mmol/L (ref 98–111)
Creatinine, Ser: 0.68 mg/dL (ref 0.44–1.00)
GFR, Estimated: >60 mL/min (ref >60)
Glucose, Bld: 81 mg/dL (ref 70–99)
Potassium: 3.7 mmol/L (ref 3.5–5.1)
Sodium: 140 mmol/L (ref 135–145)
Total Bilirubin: 0.3 mg/dL (ref 0.3–1.2)
Total Protein: 6.9 g/dL (ref 6.5–8.1)

## 2021-08-21 LAB — HCG, SERUM, QUALITATIVE: Preg, Serum: NEGATIVE

## 2021-08-21 LAB — LIPASE, BLOOD: Lipase: 27 U/L (ref 11–51)

## 2021-08-21 IMAGING — CT CT ABD-PELV W/ CM
2 of 4 series · 16 of 46 positions shown, 18 images · IV contrast (APPLIED)
Comparison: Multiple prior exams most recently 10 days ago
[DATE]

CLINICAL DATA: Acute abdominal pain. History of gastric bypass.
Known hernia with pain at hernia site.

EXAM:
CT ABDOMEN AND PELVIS WITH CONTRAST
TECHNIQUE: Multidetector CT imaging of the abdomen and pelvis was performed
using the standard protocol following bolus administration of
intravenous contrast.

[Series 2: abd pel w · axial · 0.89mm/px · z∈[+757,+1197]mm · 13 of 98 slices shown, 15 images]
[im 5/98  soft-tissue]
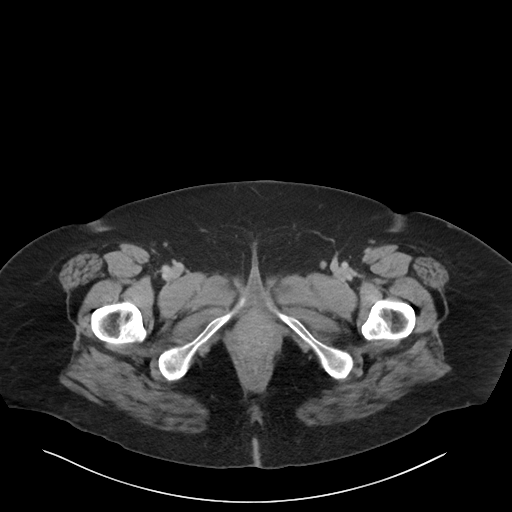
[im 5/98  bone]
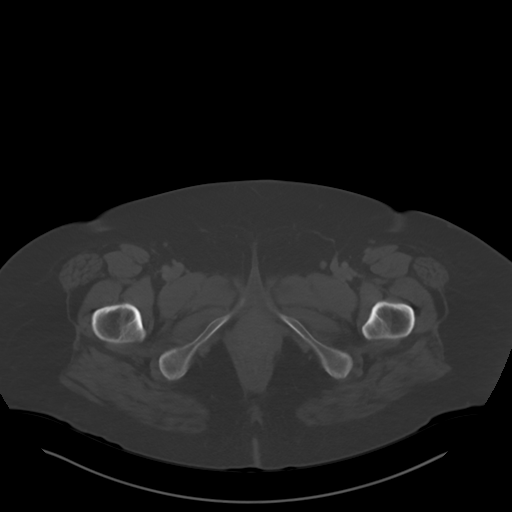
[im 13/98  soft-tissue]
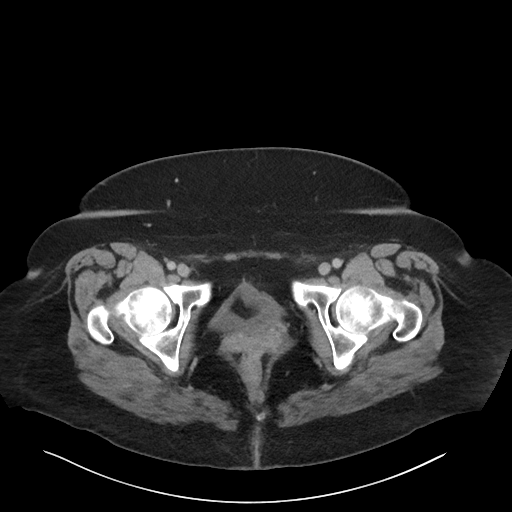
[im 22/98  soft-tissue]
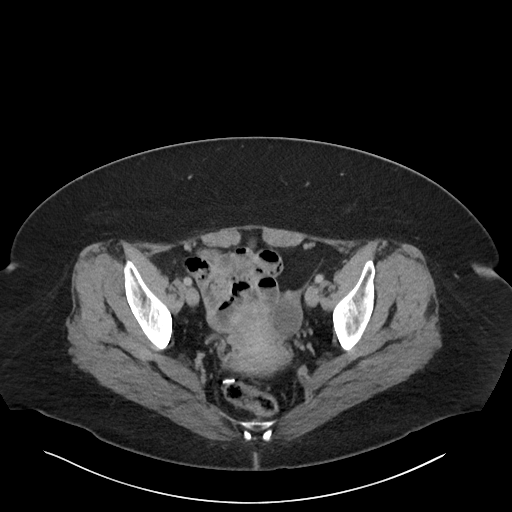
[im 26/98  soft-tissue]
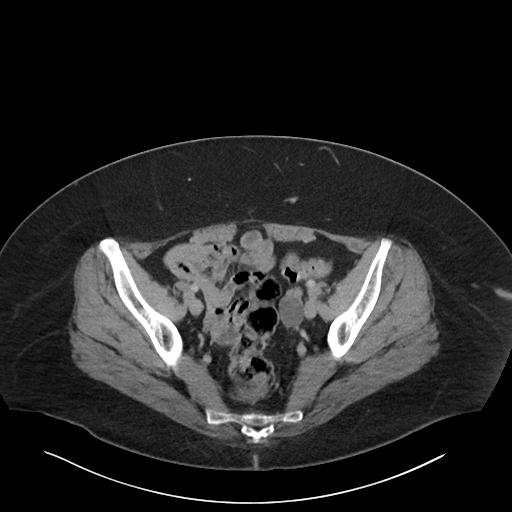
[im 34/98  soft-tissue]
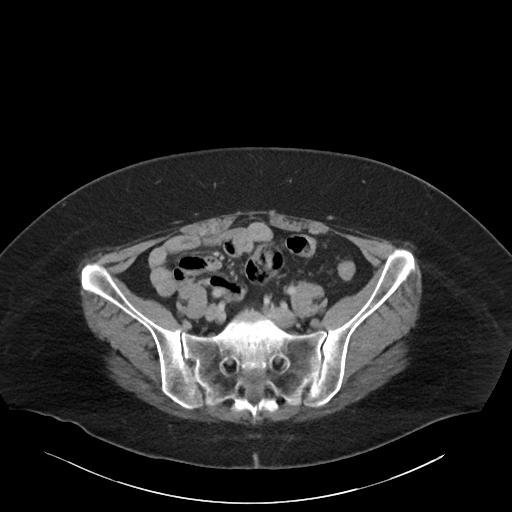
[im 43/98  soft-tissue]
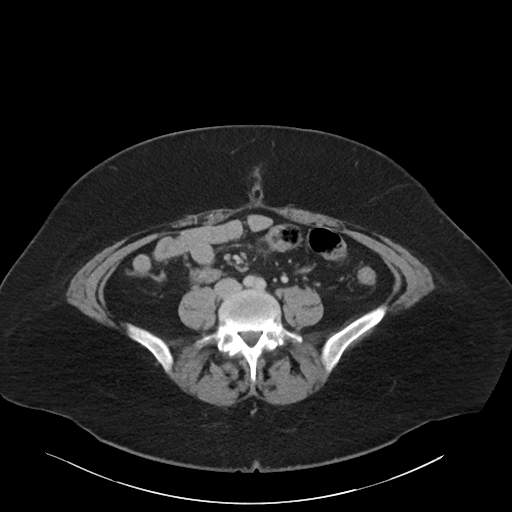
[im 51/98  soft-tissue]
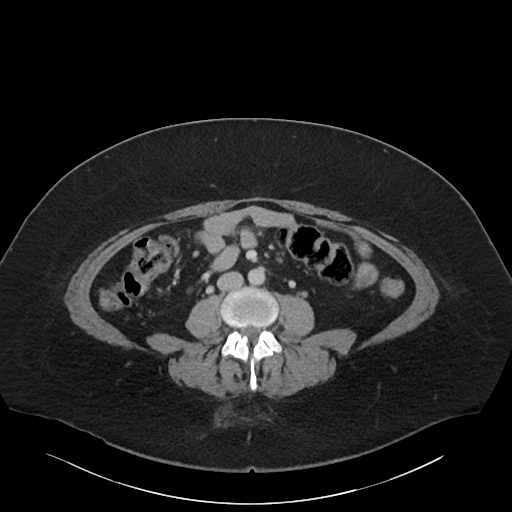
[im 55/98  soft-tissue]
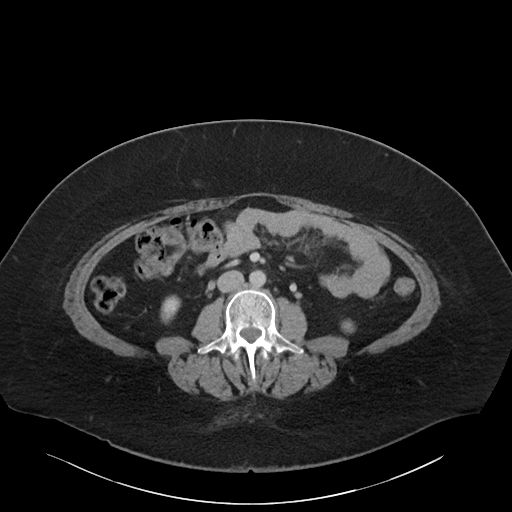
[im 64/98  soft-tissue]
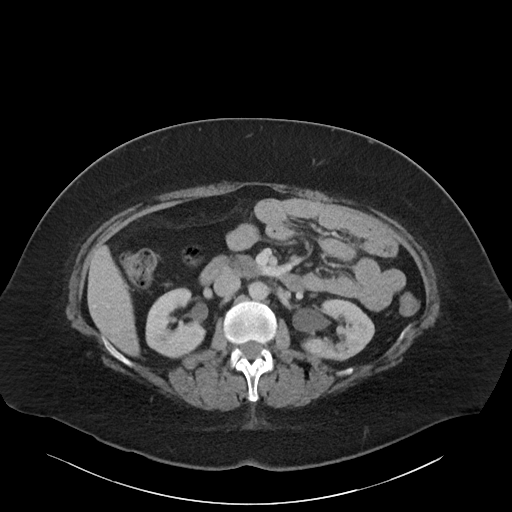
[im 64/98  bone]
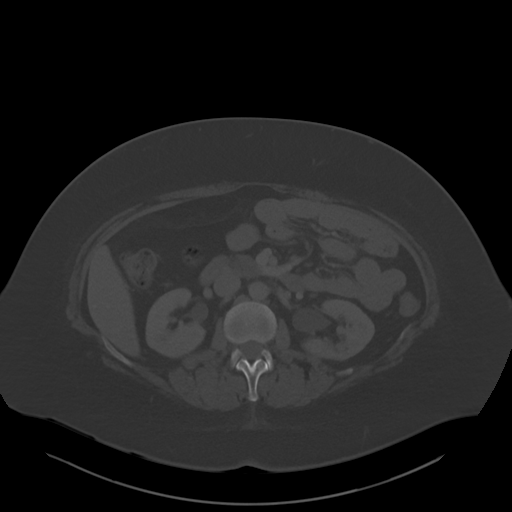
[im 72/98  soft-tissue]
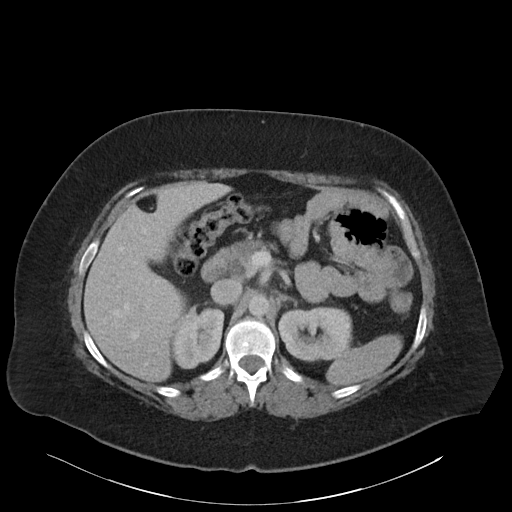
[im 76/98  soft-tissue]
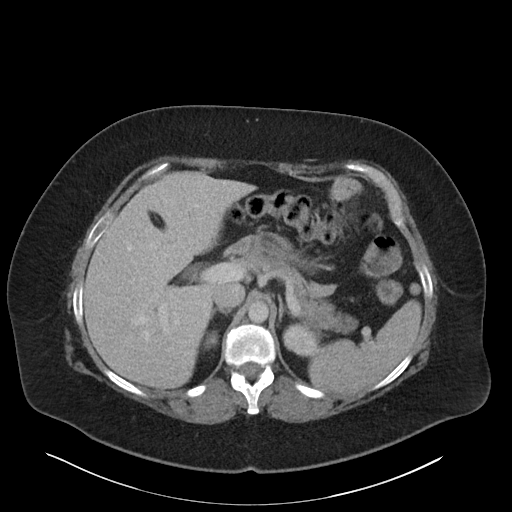
[im 85/98  soft-tissue]
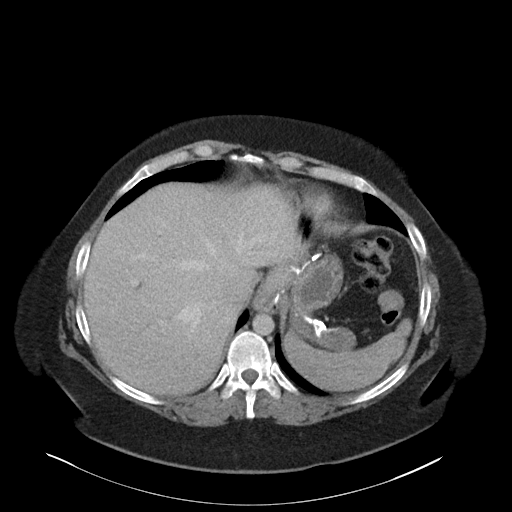
[im 93/98  soft-tissue]
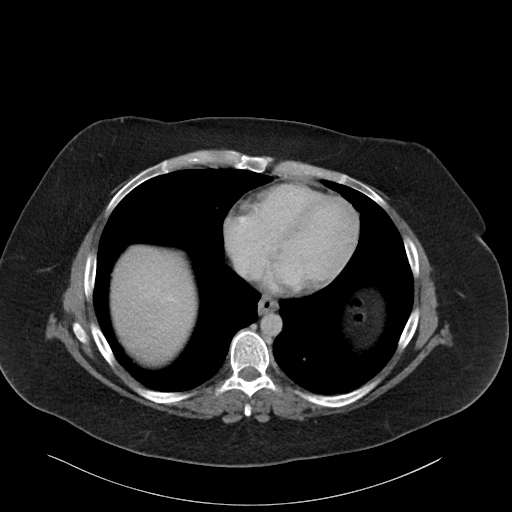

[Series 5: coronal · coronal · 0.81mm/px · 3 of 111 slices shown]
[im 37/111  soft-tissue]
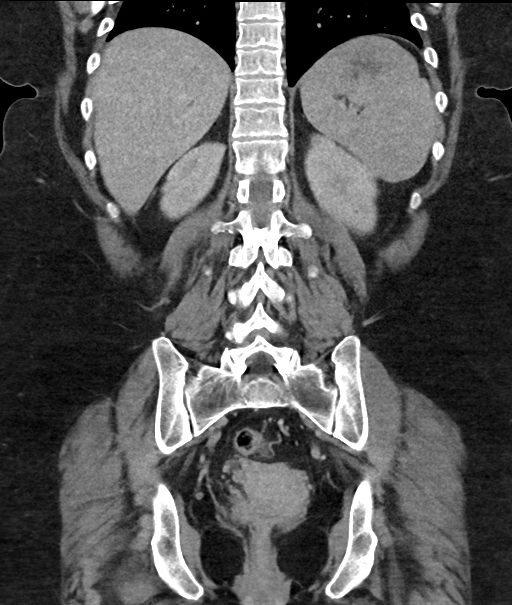
[im 49/111  soft-tissue]
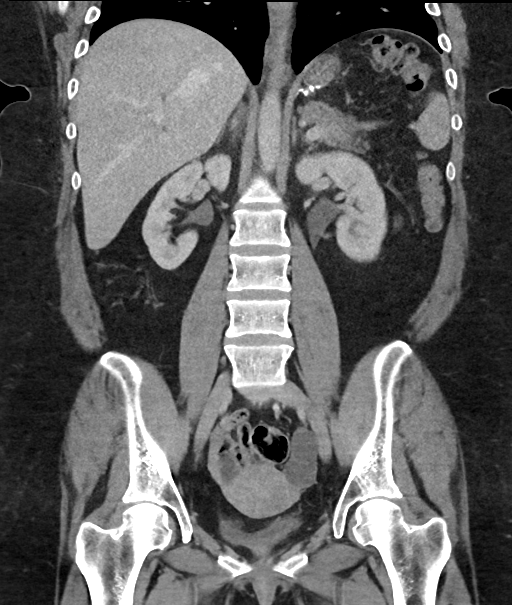
[im 62/111  soft-tissue]
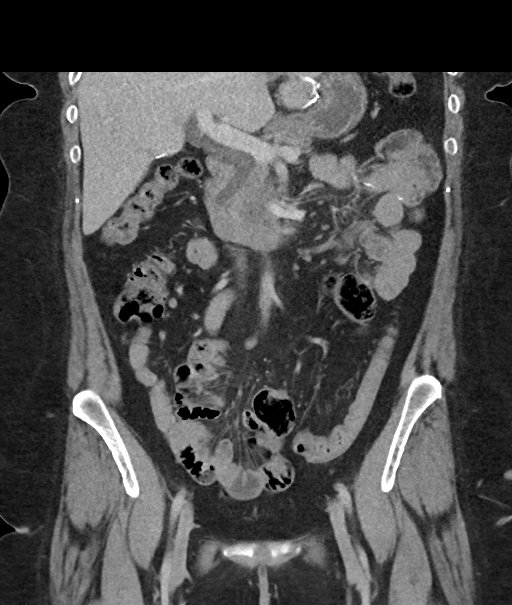

[16 of 46 positions shown; findings below may reference images not displayed]

RADIATION DOSE REDUCTION: This exam was performed according to the
departmental dose-optimization program which includes automated
exposure control, adjustment of the mA and/or kV according to
patient size and/or use of iterative reconstruction technique.

CONTRAST:  100mL OMNIPAQUE IOHEXOL 300 MG/ML  SOLN
FINDINGS: Lower chest: No pleural effusion or acute airspace disease.

Hepatobiliary: No focal liver lesion. Post cholecystectomy with
stable biliary tree. Mild intrahepatic biliary ductal prominence and
stable common bile duct dilatation, typically post cholecystectomy
related.

Pancreas: No ductal dilatation or inflammation.

Spleen: Normal in size without focal abnormality. Splenule
anteriorly.

Adrenals/Urinary Tract: No adrenal nodule. No hydronephrosis, renal
calculi, or focal renal abnormality. Nondistended urinary bladder.

Stomach/Bowel: Small hiatal hernia. Post gastric bypass with gastric
suture line crossing the diaphragmatic hiatus. No abnormal fluid or
distension of the excluded gastric remnant. The Roux limb is
nondilated. Unremarkable jejunal anastomosis. No small bowel
obstruction or inflammation. High-riding cecum again seen. Moderate
colonic stool burden. No colonic inflammation. Normal appendix.

Vascular/Lymphatic: Normal caliber abdominal aorta. Patent portal,
splenic and mesenteric veins. No portal venous or mesenteric gas. No
abdominopelvic adenopathy.

Reproductive: Uterus and bilateral adnexa are unremarkable.

Other: No free air, free fluid, or intra-abdominal fluid collection.
Tiny fat containing umbilical hernia without inflammation.

Musculoskeletal: There are no acute or suspicious osseous
abnormalities. Stable sclerotic focus in the left iliac bone.
IMPRESSION: 1. No acute abnormality in the abdomen/pelvis.
2. Post gastric bypass without complication. Small hiatal hernia.
3. Post cholecystectomy with stable chronic biliary prominence.

## 2021-08-21 MED ORDER — MORPHINE SULFATE (PF) 4 MG/ML IV SOLN
4.0000 mg | Freq: Once | INTRAVENOUS | Status: AC
  Administered 2021-08-21: 4 mg via INTRAVENOUS
  Filled 2021-08-21: qty 1

## 2021-08-21 MED ORDER — SODIUM CHLORIDE 0.9 % IV BOLUS
1000.0000 mL | Freq: Once | INTRAVENOUS | Status: AC
  Administered 2021-08-21: 1000 mL via INTRAVENOUS

## 2021-08-21 MED ORDER — TRAMADOL HCL 50 MG PO TABS: 50.0000 mg | ORAL_TABLET | Freq: Two times a day (BID) | ORAL | 0 refills | Status: AC | PRN

## 2021-08-21 MED ORDER — IOHEXOL 300 MG/ML  SOLN
100.0000 mL | Freq: Once | INTRAMUSCULAR | Status: AC | PRN
  Administered 2021-08-21: 100 mL via INTRAVENOUS

## 2021-08-21 MED ORDER — ONDANSETRON HCL 4 MG/2ML IJ SOLN
4.0000 mg | Freq: Once | INTRAMUSCULAR | Status: AC
  Administered 2021-08-21: 4 mg via INTRAVENOUS
  Filled 2021-08-21: qty 2

## 2021-08-21 MED ORDER — LIDOCAINE VISCOUS HCL 2 % MT SOLN
15.0000 mL | Freq: Once | OROMUCOSAL | Status: AC
  Administered 2021-08-21: 15 mL via ORAL
  Filled 2021-08-21: qty 15

## 2021-08-21 MED ORDER — TRAMADOL HCL 50 MG PO TABS
50.0000 mg | ORAL_TABLET | Freq: Once | ORAL | Status: AC
  Administered 2021-08-21: 50 mg via ORAL
  Filled 2021-08-21: qty 1

## 2021-08-21 MED ORDER — ALUM & MAG HYDROXIDE-SIMETH 200-200-20 MG/5ML PO SUSP
30.0000 mL | Freq: Once | ORAL | Status: AC
  Administered 2021-08-21: 30 mL via ORAL
  Filled 2021-08-21: qty 30

## 2021-08-21 MED ORDER — DIPHENHYDRAMINE HCL 50 MG/ML IJ SOLN
12.5000 mg | Freq: Once | INTRAMUSCULAR | Status: AC
  Administered 2021-08-21: 12.5 mg via INTRAVENOUS
  Filled 2021-08-21: qty 1

## 2021-08-21 MED ORDER — METOCLOPRAMIDE HCL 5 MG/ML IJ SOLN
10.0000 mg | Freq: Once | INTRAMUSCULAR | Status: AC
  Administered 2021-08-21: 10 mg via INTRAVENOUS
  Filled 2021-08-21: qty 2

## 2021-08-21 MED ORDER — METOCLOPRAMIDE HCL 10 MG PO TABS: 10.0000 mg | ORAL_TABLET | Freq: Four times a day (QID) | ORAL | 0 refills | Status: DC

## 2021-08-21 MED ORDER — FAMOTIDINE IN NACL 20-0.9 MG/50ML-% IV SOLN
20.0000 mg | Freq: Once | INTRAVENOUS | Status: AC
  Administered 2021-08-21: 20 mg via INTRAVENOUS
  Filled 2021-08-21: qty 50

## 2021-08-21 NOTE — ED Notes (Signed)
Pt verbalizes understanding of discharge instructions. Opportunity for questioning and answers were provided. Pt discharged from ED to home with husband.    

## 2021-08-21 NOTE — ED Triage Notes (Signed)
Pt to er, pt states that she is here for abd pain, nausea and vomiting, states that she has a hx of a hiatal hernia, states that she is seeing a Careers adviser and he will do surgery once she can pay the down payment.  States that she is here today because he sent her for abd pain, nausea vomiting and diarrhea.  States this is the norm for her when her hernia is bothering her.   ?

## 2021-08-21 NOTE — ED Provider Notes (Signed)
[facility] EMERGENCY DEPT Provider Note   CSN: [id number] Arrival date & time: 08/21/21  1609     History  Chief Complaint  Patient presents with   Abdominal Pain    [name] [name] is a 36 y.o. female.  HPI   Pt is a 36 y/o female with a h/o bipolar 2 disorder, ADD, anemia, anxiety, b12 deficiency, constipation, depression, fatty liver, gallbladder, GERD, HTN, gastric bypass, obesity, nephrolithiasis, PCOS, who presents to the ED today for eval of NVD for the last 4 days. She has about 4-5 episodes daily. Pain is located to the epigastrium and luq.  She has tried zofran and promethazine at home without relief.   Home Medications Prior to Admission medications   Medication Sig Start Date End Date Taking? Authorizing Provider  metoCLOPramide (REGLAN) 10 MG tablet Take 1 tablet (10 mg total) by mouth every 6 (six) hours. 08/21/21  Yes Evalyne Cortopassi S, PA-C  traMADol (ULTRAM) 50 MG tablet Take 1 tablet (50 mg total) by mouth every 12 (twelve) hours as needed for up to 2 days. 08/21/21 08/23/21 Yes Ramir Malerba S, PA-C  acetaminophen (TYLENOL) 500 MG tablet Take 2 tablets (1,000 mg total) by mouth every 6 (six) hours as needed for moderate pain. 02/13/19   Fayrene Helper, PA-C  busPIRone (BUSPAR) 15 MG tablet Take 15 mg by mouth 3 (three) times daily. 11/01/19   [provider]  CALCIUM PO Take 1 tablet by mouth daily.    [provider]  clonazePAM (KLONOPIN) 1 MG tablet Take 0.5 tablets (0.5 mg total) by mouth 2 (two) times daily as needed for anxiety. 03/27/21   Willow Ora, MD  clotrimazole (MYCELEX) 10 MG troche Take 10 mg by mouth 5 (five) times daily. 09/24/20   [provider]  cyanocobalamin (,VITAMIN B-12,) 1000 MCG/ML injection Inject 1 vial per week for 3 weeks, then 1 vial per month thereafter 06/21/20   Willow Ora, MD  hydrOXYzine (VISTARIL) 50 MG capsule Take 50 mg by mouth every 6 (six) hours as needed. 12/26/20   [provider]  hyoscyamine (LEVSIN) 0.125 MG tablet Take 1 tablet (0.125 mg total) by mouth every 6 (six) hours as needed for Cramping for up to 10 days 08/11/21   Haskel Schroeder, PA-C  lamoTRIgine (LAMICTAL) 150 MG tablet Take 300 mg by mouth daily.  04/10/20   [provider]  lithium carbonate (LITHOBID) 300 MG CR tablet Take 600 mg by mouth at bedtime.    [provider]  Magnesium 500 MG TABS Take 1 tablet by mouth daily.    [provider]  metFORMIN (GLUCOPHAGE) 500 MG tablet Take 500 mg by mouth 2 (two) times daily. 11/01/19   [provider]  methocarbamol (ROBAXIN) 500 MG tablet Take 1 tablet (500 mg total) by mouth at bedtime as needed for muscle spasms. 07/12/21   Rodriguez-Southworth, Nettie Elm, PA-C  nortriptyline (PAMELOR) 10 MG capsule TAKE 1 CAPSULE BY MOUTH AT BEDTIME. 04/04/21   Kirsteins, Victorino Sparrow, MD  ondansetron (ZOFRAN) 4 MG tablet Take 2 tablets (8 mg total) by mouth every 8 (eight) hours as needed for nausea or vomiting. 07/21/21   Karie Soda, MD  pantoprazole (PROTONIX) 20 MG tablet TAKE 1 TABLET BY MOUTH EVERY DAY 06/21/21   Willow Ora, MD  promethazine (PHENERGAN) 25 MG suppository Place 1 suppository (25 mg total) rectally every 6 (six) hours as needed for nausea. 07/21/21   Karie Soda, MD  promethazine (PHENERGAN) 6.25  MG/5ML syrup Take 10 mLs (12.5 mg total) by mouth every 6 (six) hours as needed for Nausea for up to 14 days 07/21/21   Karie SodaGross, Steven, MD  QUEtiapine (SEROQUEL) 400 MG tablet Take 400 mg by mouth at bedtime.  08/24/19   [provider]  QUEtiapine (SEROQUEL) 50 MG tablet Take by mouth. 06/27/20   [provider]  SYRINGE-NEEDLE, DISP, 3 ML 25G X 1" 3 ML MISC Use 1 needle per application once weekly 06/21/20   Willow OraAndy, Camille L, MD  zolpidem (AMBIEN CR) 6.25 MG CR tablet Take 1 tablet (6.25 mg total) by mouth at bedtime as needed for up to 7 days for sleep. 07/19/21 07/26/21  Allwardt, Crist InfanteAlyssa M, PA-C       Allergies    Nsaids, Other, and Zolpidem    Review of Systems   Review of Systems See HPI for pertinent positives or negatives.   Physical Exam Updated Vital Signs BP 134/83    Pulse 89    Temp 98.2 F (36.8 C) (Oral)    Resp 18    Ht 5\' 4"  (1.626 m)    Wt 113.4 kg    SpO2 99%    BMI 42.91 kg/m  Physical Exam Vitals and nursing note reviewed.  Constitutional:      General: She is not in acute distress.    Appearance: She is well-developed.  HENT:     Head: Normocephalic and atraumatic.  Eyes:     Conjunctiva/sclera: Conjunctivae normal.  Cardiovascular:     Rate and Rhythm: Normal rate and regular rhythm.     Heart sounds: No murmur heard. Pulmonary:     Effort: Pulmonary effort is normal. No respiratory distress.     Breath sounds: Normal breath sounds.  Abdominal:     General: Bowel sounds are normal.     Palpations: Abdomen is soft.     Tenderness: There is no abdominal tenderness. There is no guarding or rebound.  Musculoskeletal:        General: No swelling.     Cervical back: Neck supple.  Skin:    General: Skin is warm and dry.     Capillary Refill: Capillary refill takes less than 2 seconds.  Neurological:     Mental Status: She is alert.  Psychiatric:        Mood and Affect: Mood normal.     ED Results / Procedures / Treatments   Labs (all labs ordered are listed, but only abnormal results are displayed) Labs Reviewed  COMPREHENSIVE METABOLIC PANEL - Abnormal; Notable for the following components:      Result Value   AST 11 (*)    All other components within normal limits  URINALYSIS, ROUTINE W REFLEX MICROSCOPIC - Abnormal; Notable for the following components:   Protein, ur TRACE (*)    All other components within normal limits  CBC WITH DIFFERENTIAL/PLATELET  LIPASE, BLOOD  HCG, SERUM, QUALITATIVE    EKG None  Radiology CT ABDOMEN PELVIS W CONTRAST  Result Date: 08/21/2021 CLINICAL DATA:  Acute abdominal pain. History of gastric  bypass. Known hernia with pain at hernia site. EXAM: CT ABDOMEN AND PELVIS WITH CONTRAST TECHNIQUE: Multidetector CT imaging of the abdomen and pelvis was performed using the standard protocol following bolus administration of intravenous contrast. RADIATION DOSE REDUCTION: This exam was performed according to the departmental dose-optimization program which includes automated exposure control, adjustment of the mA and/or kV according to patient size and/or use of iterative reconstruction technique. CONTRAST:  OMNIPAQUE IOHEXOL 300 MG/ML  SOLN COMPARISON:  Multiple prior exams most recently 10 days ago 08/11/2021 FINDINGS: Lower chest: No pleural effusion or acute airspace disease. Hepatobiliary: No focal liver lesion. Post cholecystectomy with stable biliary tree. Mild intrahepatic biliary ductal prominence and stable common bile duct dilatation, typically post cholecystectomy related. Pancreas: No ductal dilatation or inflammation. Spleen: Normal in size without focal abnormality. Splenule anteriorly. Adrenals/Urinary Tract: No adrenal nodule. No hydronephrosis, renal calculi, or focal renal abnormality. Nondistended urinary bladder. Stomach/Bowel: Small hiatal hernia. Post gastric bypass with gastric suture line crossing the diaphragmatic hiatus. No abnormal fluid or distension of the excluded gastric remnant. The Roux limb is nondilated. Unremarkable jejunal anastomosis. No small bowel obstruction or inflammation. High-riding cecum again seen. Moderate colonic stool burden. No colonic inflammation. Normal appendix. Vascular/Lymphatic: Normal caliber abdominal aorta. Patent portal, splenic and mesenteric veins. No portal venous or mesenteric gas. No abdominopelvic adenopathy. Reproductive: Uterus and bilateral adnexa are unremarkable. Other: No free air, free fluid, or intra-abdominal fluid collection. Tiny fat containing umbilical hernia without inflammation. Musculoskeletal: There are no acute or  suspicious osseous abnormalities. Stable sclerotic focus in the left iliac bone. IMPRESSION: 1. No acute abnormality in the abdomen/pelvis. 2. 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 Small hiatal hernia. 3. Post cholecystectomy with stable chronic biliary prominence. Electronically Signed   By: Narda Rutherford M.D.   On: 08/21/2021 19:21    Procedures Procedures    Medications Ordered in ED Medications  sodium chloride 0.9 % bolus 1,000 mL (0 mLs Intravenous Stopped 08/21/21 1918)  ondansetron (ZOFRAN) injection 4 mg (4 mg Intravenous Given 08/21/21 1817)  morphine (PF) 4 MG/ML injection 4 mg (4 mg Intravenous Given 08/21/21 1820)  iohexol (OMNIPAQUE) 300 MG/ML solution 100 mL (100 mLs Intravenous Contrast Given 08/21/21 1903)  alum & mag hydroxide-simeth (MAALOX/MYLANTA) 200-200-20 MG/5ML suspension 30 mL (30 mLs Oral Given 08/21/21 1957)    And  lidocaine (XYLOCAINE) 2 % viscous mouth solution 15 mL (15 mLs Oral Given 08/21/21 1957)  famotidine (PEPCID) IVPB 20 mg premix (0 mg Intravenous Stopped 08/21/21 2046)  morphine (PF) 4 MG/ML injection 4 mg (4 mg Intravenous Given 08/21/21 1958)  metoCLOPramide (REGLAN) injection 10 mg (10 mg Intravenous Given 08/21/21 2041)  diphenhydrAMINE (BENADRYL) injection 12.5 mg (12.5 mg Intravenous Given 08/21/21 2041)    ED Course/ Medical Decision Making/ A&P                           Medical Decision Making Amount and/or Complexity of Data Reviewed Labs: ordered. Radiology: ordered.  Risk OTC drugs. Prescription drug management.   This patient presents to the ED for concern of abd pain, nvd, this involves an extensive number of treatment options, and is a complaint that carries with it a high risk of complications and morbidity.  The differential diagnosis includes but is not limited to gastritis/PUD, enteritis/duodenitis, appendicitis, cholelithiasis/cholecystitis, cholangitis, pancreatitis, ruptured viscus, colitis, diverticulitis, proctitis, cystitis,  pyelonephritis, ureteral colic, aortic dissection, aortic aneurysm. In women, ectopic pregnancy, pelvic inflammatory disease, ovarian cysts, and tubo-ovarian abscess were also considered. Atypical chest etiologies were also considered including ACS, PE, and pneumonia.    Comorbidities that complicate the patient evaluation: Patients presentation is complicated by their history of multiple abd surgeries and multiple comorbidities  Additional history obtained: Records reviewed previous admission documents and Care Everywhere/External Records  Lab Tests: I Ordered, and personally interpreted labs.  The pertinent results include:   CBC wnl CMP wnl Lipase wnl  Beta qual neg UA neg  Imaging Studies ordered: I ordered, independently visualized, and interpreted imaging which showed  Ct abd/pelvis -  1. No acute abnormality in the abdomen/pelvis. 2. 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 Small hiatal hernia. 3. Post cholecystectomy with stable chronic biliary prominence.   I agree with the radiologist interpretation  Medicines ordered and prescription drug management: I ordered medication including antiemetics, pain meds, gi cocktail, ivf  for pain, nv  Reevaluation of the patient after these medicines showed that the patient    improved  Critical Interventions: antiemetics, pain meds, ivf  Complexity of problems addressed: Patients presentation is most consistent with  acute complicated illness/injury requiring diagnostic workup  Disposition: After consideration of the diagnostic results and the patients response to treatment,  I feel that the patent would benefit from discharge home with Reglan and pain medications.  Advise continuation of her PPI at home.  Advise follow-up with her GI doctor as well as her general surgeon.  She does not appear to require admission at this time and is tolerating p.o. and states she feels improved after interventions in the ED.  She has a reassuring  work-up.  She is comfortable with the plan for discharge and is in agreement to follow-up.  Understands return precautions.  All questions answered.  Patient stable for discharge. .    Final Clinical Impression(s) / ED Diagnoses Final diagnoses:  Abdominal pain, unspecified abdominal location    Rx / DC Orders ED Discharge Orders          Ordered    traMADol (ULTRAM) 50 MG tablet  Every 12 hours PRN        08/21/21 2123    metoCLOPramide (REGLAN) 10 MG tablet  Every 6 hours        08/21/21 2125              Ndia Sampath S, PA-C 08/21/21 2126    Rozelle Logan, DO 08/21/21 2227

## 2021-08-21 NOTE — Discharge Instructions (Addendum)
Prescription given for Tramadol. Take medication as directed and do not operate machinery, drive a car, or work while taking this medication as it can make you drowsy. This pain medication can also make you constipated so you should take a stool softener with it such as miralax. ? ?Please follow up with your primary doctor within the next 5-7 days.  If you do not have a primary care provider, information for a healthcare clinic has been provided for you to make arrangements for follow up care. Please return to the ER sooner if you have any new or worsening symptoms, or if you have any of the following symptoms: ? ?Abdominal pain that does not go away.  ?You have a fever.  ?You keep throwing up (vomiting).  ?The pain is felt only in portions of the abdomen. Pain in the right side could possibly be appendicitis. In an adult, pain in the left lower portion of the abdomen could be colitis or diverticulitis.  ?You pass bloody or black tarry stools.  ?There is bright red blood in the stool.  ?The constipation stays for more than 4 days.  ?There is belly (abdominal) or rectal pain.  ?You do not seem to be getting better.  ?You have any questions or concerns.  ? ?

## 2021-08-22 ENCOUNTER — Other Ambulatory Visit (HOSPITAL_BASED_OUTPATIENT_CLINIC_OR_DEPARTMENT_OTHER): Payer: Self-pay

## 2021-09-04 ENCOUNTER — Encounter: Payer: Self-pay | Admitting: Family Medicine

## 2021-09-04 ENCOUNTER — Ambulatory Visit: Payer: 59 | Admitting: Family Medicine

## 2021-09-04 VITALS — BP 138/94 | HR 107 | Temp 98.4°F | Ht 64.0 in | Wt 256.4 lb

## 2021-09-04 DIAGNOSIS — Z9884 Bariatric surgery status: Secondary | ICD-10-CM

## 2021-09-04 DIAGNOSIS — F339 Major depressive disorder, recurrent, unspecified: Secondary | ICD-10-CM | POA: Diagnosis not present

## 2021-09-04 DIAGNOSIS — Z79899 Other long term (current) drug therapy: Secondary | ICD-10-CM | POA: Diagnosis not present

## 2021-09-04 DIAGNOSIS — F3181 Bipolar II disorder: Secondary | ICD-10-CM

## 2021-09-04 DIAGNOSIS — F5105 Insomnia due to other mental disorder: Secondary | ICD-10-CM

## 2021-09-04 DIAGNOSIS — K449 Diaphragmatic hernia without obstruction or gangrene: Secondary | ICD-10-CM

## 2021-09-04 DIAGNOSIS — F99 Mental disorder, not otherwise specified: Secondary | ICD-10-CM

## 2021-09-04 NOTE — Patient Instructions (Addendum)
Please return in 12 months for your annual complete physical; please come fasting.  ? ?Best of luck on your upcoming surgery! ?Glad that overall you are feeling better! ? ?If you have any questions or concerns, please don't hesitate to send me a message via MyChart or call the office at [phone]. Thank you for visiting with [place] today! It's our pleasure caring for you.  ?

## 2021-09-04 NOTE — Progress Notes (Signed)
? ?Subjective  ?CC:  ?Chief Complaint  ?Patient presents with  ? Bi Polar Disorder  ? Nausea  ? Emesis  ? ? ?HPI: [name] [name] is a 36 y.o. female who presents to the office today to address the problems listed above in the chief complaint. ?36 year old with multiple medical problems including mood disorder managed by psychiatry on multiple medications, history of gastric bypass with small hiatal hernia and significant reflux causing recurrent nausea and vomiting.  Has had multiple ER visits.  Reviewed recent visits and labs.  She is currently scheduled to have her hernia repaired in May of this year.  She does suffer for some mood problems and insomnia, psychiatry has increased Seroquel dosing emesis helped significantly.  Also now on metformin due to the high dose atypical antipsychotic.  She feels that she is doing well without any new concerns today.  We reviewed her problem list in detail.  Reviewed medications. ? ?Assessment  ?1. Major depression, recurrent, chronic (HCC)   ?2. Bipolar 2 disorder (HCC)   ?3. Insomnia due to other mental disorder   ?4. Polypharmacy   ?5. Hiatal hernia   ?6. History of Roux-en-Y gastric bypass 2014   ? ?  ?Plan  ?Chronic medical problems including bipolar disorder, depression, hernia with severe reflux causing nausea vomiting, history of gastric bypass, vitamin deficiencies are all reviewed and are stable.  No medication changes made today.  Health maintenance up-to-date.  Preoperatively clearance given, average medical risk.  Fortunately she is no longer on benzodiazepines. ? ?Follow up: 12 months for your complete annual physical exam with blood work. Please come fasting. ?Visit date not found ? ?No orders of the defined types were placed in this encounter. ? ?No orders of the defined types were placed in this encounter. ? ?  ? ?I reviewed the patients updated PMH, FH, and SocHx.  ?  ?Patient Active Problem List  ? Diagnosis Date Noted  ? Bipolar 2 disorder (HCC)  09/19/2019  ?  Priority: High  ? Fibromyalgia 09/09/2019  ?  Priority: High  ? Panic disorder 06/01/2019  ?  Priority: High  ? Family history of colon cancer 05/16/2019  ?  Priority: High  ? Major depression, recurrent, chronic (HCC) 05/16/2019  ?  Priority: High  ? Insomnia due to other mental disorder 03/13/2019  ?  Priority: High  ? Acquired iron deficiency anemia due to decreased absorption 02/22/2015  ?  Priority: High  ? 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  ?  Priority: High  ? Vitamin B12 deficiency 06/01/2019  ?  Priority: Medium   ? Pain in both lower extremities 11/21/2018  ?  Priority: Medium   ? Nocturnal leg cramps 10/26/2018  ?  Priority: Medium   ? Nephrolithiasis   ?  Priority: Medium   ? Vitamin D deficiency 06/01/2019  ?  Priority: Low  ? Dehydration 05/31/2021  ? 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  ? Plantar fasciitis 10/31/2019  ? Ischial bursitis of left side 07/19/2019  ? ?Current Meds  ?Medication Sig  ? acetaminophen (TYLENOL) 500 MG tablet Take 2 tablets (1,000 mg total) by mouth every 6 (six) hours as needed for moderate pain.  ? busPIRone (BUSPAR) 15 MG tablet Take 15 mg by mouth 3 (three) times daily.  ? CALCIUM PO Take 1 tablet by mouth daily.  ? clotrimazole (MYCELEX) 10 MG troche Take 10 mg by mouth 5 (five) times daily.  ? cyanocobalamin (,VITAMIN B-12,) 1000 MCG/ML injection Inject  1 vial per week for 3 weeks, then 1 vial per month thereafter  ? hydrOXYzine (VISTARIL) 50 MG capsule Take 50 mg by mouth every 6 (six) hours as needed.  ? hyoscyamine (LEVSIN) 0.125 MG tablet Take 1 tablet (0.125 mg total) by mouth every 6 (six) hours as needed for Cramping for up to 10 days  ? lamoTRIgine (LAMICTAL) 150 MG tablet Take 300 mg by mouth daily.   ? lithium carbonate (LITHOBID) 300 MG CR tablet Take 600 mg by mouth at bedtime.  ? Magnesium 500 MG TABS Take 1 tablet by mouth daily.  ? metFORMIN (GLUCOPHAGE) 500 MG tablet Take 500 mg by mouth 2 (two) times  daily.  ? methocarbamol (ROBAXIN) 500 MG tablet Take 1 tablet (500 mg total) by mouth at bedtime as needed for muscle spasms.  ? metoCLOPramide (REGLAN) 10 MG tablet Take 1 tablet (10 mg total) by mouth every 6 (six) hours.  ? nortriptyline (PAMELOR) 10 MG capsule TAKE 1 CAPSULE BY MOUTH AT BEDTIME.  ? ondansetron (ZOFRAN) 4 MG tablet Take 2 tablets (8 mg total) by mouth every 8 (eight) hours as needed for nausea or vomiting.  ? pantoprazole (PROTONIX) 20 MG tablet TAKE 1 TABLET BY MOUTH EVERY DAY  ? promethazine (PHENERGAN) 25 MG suppository Place 1 suppository (25 mg total) rectally every 6 (six) hours as needed for nausea.  ? promethazine (PHENERGAN) 6.25 MG/5ML syrup Take 10 mLs (12.5 mg total) by mouth every 6 (six) hours as needed for Nausea for up to 14 days  ? QUEtiapine (SEROQUEL) 400 MG tablet Take 400 mg by mouth at bedtime.   ? QUEtiapine (SEROQUEL) 50 MG tablet Take by mouth.  ? SYRINGE-NEEDLE, DISP, 3 ML 25G X 1" 3 ML MISC Use 1 needle per application once weekly  ? ? ?Allergies: ?Patient is allergic to nsaids. ?Family History: ?Patient family history includes Anxiety disorder in her father and mother; Bipolar disorder in her father; Cancer in her father; Colon cancer (age of onset: 8050) in her father; Depression in her father and mother; Diabetes in her maternal grandfather; Hyperlipidemia in her father; Hypertension in her father, maternal grandmother, and mother; Kidney disease in her father; Obesity in her father and mother; Sleep apnea in her father; Uterine cancer (age of onset: 1976) in her maternal grandmother. ?Social History:  ?Patient  reports that she has never smoked. She has never used smokeless tobacco. She reports that she does not currently use alcohol. She reports that she does not use drugs. ? ?Review of Systems: ?Constitutional: Negative for fever malaise or anorexia ?Cardiovascular: negative for chest pain ?Respiratory: negative for SOB or persistent cough ?Gastrointestinal: negative  for abdominal pain ? ?Objective  ?Vitals: BP (!) 138/94   Pulse (!) 107   Temp 98.4 ?F (36.9 ?C) (Temporal)   Ht 5\' 4"  (1.626 m)   Wt 256 lb 6.4 oz (116.3 kg)   SpO2 97%   BMI 44.01 kg/m?  ?General: no acute distress , A&Ox3 ?Psych: Happy affect, normal mood.  Normal speech ?HEENT: PEERL, conjunctiva normal, neck is supple ?Skin:  Warm, no rashes ? ? ? ?Commons side effects, risks, benefits, and alternatives for medications and treatment plan prescribed today were discussed, and the patient expressed understanding of the given instructions. Patient is instructed to call or message via MyChart if he/she has any questions or concerns regarding our treatment plan. No barriers to understanding were identified. We discussed Red Flag symptoms and signs in detail. Patient expressed understanding regarding what to do  in case of urgent or emergency type symptoms.  ?Medication list was reconciled, printed and provided to the patient in AVS. Patient instructions and summary information was reviewed with the patient as documented in the AVS. ?This note was prepared with assistance of Conservation officer, historic buildings. Occasional wrong-word or sound-a-like substitutions may have occurred due to the inherent limitations of voice recognition software ? ?This visit occurred during the SARS-CoV-2 public health emergency.  Safety protocols were in place, including screening questions prior to the visit, additional usage of staff PPE, and extensive cleaning of exam room while observing appropriate contact time as indicated for disinfecting solutions.  ? ?

## 2021-09-16 ENCOUNTER — Other Ambulatory Visit: Payer: Self-pay

## 2021-09-16 ENCOUNTER — Emergency Department (HOSPITAL_BASED_OUTPATIENT_CLINIC_OR_DEPARTMENT_OTHER)
Admission: EM | Admit: 2021-09-16 | Discharge: 2021-09-16 | Disposition: A | Discharge status: Home / Self Care | Payer: 59 | Attending: Emergency Medicine | Admitting: Emergency Medicine

## 2021-09-16 DIAGNOSIS — K449 Diaphragmatic hernia without obstruction or gangrene: Secondary | ICD-10-CM

## 2021-09-16 DIAGNOSIS — R197 Diarrhea, unspecified: Secondary | ICD-10-CM | POA: Insufficient documentation

## 2021-09-16 DIAGNOSIS — R112 Nausea with vomiting, unspecified: Secondary | ICD-10-CM | POA: Insufficient documentation

## 2021-09-16 DIAGNOSIS — R109 Unspecified abdominal pain: Secondary | ICD-10-CM | POA: Diagnosis not present

## 2021-09-16 LAB — CBC WITH DIFFERENTIAL/PLATELET
Abs Immature Granulocytes: 0.01 K/uL (ref 0.00–0.07)
Basophils Absolute: 0.1 K/uL (ref 0.0–0.1)
Basophils Relative: 1 %
Eosinophils Absolute: 0.1 K/uL (ref 0.0–0.5)
Eosinophils Relative: 1 %
HCT: 40.3 % (ref 36.0–46.0)
Hemoglobin: 13 g/dL (ref 12.0–15.0)
Immature Granulocytes: 0 %
Lymphocytes Relative: 21 %
Lymphs Abs: 2 K/uL (ref 0.7–4.0)
MCH: 26.6 pg (ref 26.0–34.0)
MCHC: 32.3 g/dL (ref 30.0–36.0)
MCV: 82.4 fL (ref 80.0–100.0)
Monocytes Absolute: 0.5 K/uL (ref 0.1–1.0)
Monocytes Relative: 5 %
Neutro Abs: 6.7 K/uL (ref 1.7–7.7)
Neutrophils Relative %: 72 %
Platelets: 348 K/uL (ref 150–400)
RBC: 4.89 MIL/uL (ref 3.87–5.11)
RDW: 13.8 % (ref 11.5–15.5)
WBC: 9.4 K/uL (ref 4.0–10.5)
nRBC: 0 % (ref 0.0–0.2)

## 2021-09-16 LAB — COMPREHENSIVE METABOLIC PANEL
ALT: 8 U/L (ref 0–44)
AST: 13 U/L — ABNORMAL LOW (ref 15–41)
Albumin: 4.2 g/dL (ref 3.5–5.0)
Alkaline Phosphatase: 142 U/L — ABNORMAL HIGH (ref 38–126)
Anion gap: 12 (ref 5–15)
BUN: 15 mg/dL (ref 6–20)
CO2: 23 mmol/L (ref 22–32)
Calcium: 9.3 mg/dL (ref 8.9–10.3)
Chloride: 106 mmol/L (ref 98–111)
Creatinine, Ser: 0.75 mg/dL (ref 0.44–1.00)
GFR, Estimated: >60 mL/min (ref >60)
Glucose, Bld: 81 mg/dL (ref 70–99)
Potassium: 3.6 mmol/L (ref 3.5–5.1)
Sodium: 141 mmol/L (ref 135–145)
Total Bilirubin: 0.4 mg/dL (ref 0.3–1.2)
Total Protein: 6.9 g/dL (ref 6.5–8.1)

## 2021-09-16 LAB — LIPASE, BLOOD: Lipase: 24 U/L (ref 11–51)

## 2021-09-16 MED ORDER — METOCLOPRAMIDE HCL 5 MG/ML IJ SOLN
10.0000 mg | Freq: Once | INTRAMUSCULAR | Status: AC
  Administered 2021-09-16: 10 mg via INTRAVENOUS
  Filled 2021-09-16: qty 2

## 2021-09-16 MED ORDER — LACTATED RINGERS IV BOLUS
2000.0000 mL | Freq: Once | INTRAVENOUS | Status: AC
  Administered 2021-09-16: 2000 mL via INTRAVENOUS

## 2021-09-16 MED ORDER — PANTOPRAZOLE SODIUM 40 MG IV SOLR
40.0000 mg | Freq: Once | INTRAVENOUS | Status: AC
  Administered 2021-09-16: 40 mg via INTRAVENOUS
  Filled 2021-09-16: qty 10

## 2021-09-16 MED ORDER — LACTATED RINGERS IV SOLN: INTRAVENOUS | Status: DC

## 2021-09-16 MED ORDER — MORPHINE SULFATE (PF) 4 MG/ML IV SOLN
6.0000 mg | Freq: Once | INTRAVENOUS | Status: AC
  Administered 2021-09-16: 6 mg via INTRAVENOUS
  Filled 2021-09-16: qty 2

## 2021-09-16 NOTE — ED Triage Notes (Signed)
Pt to ER c/o upper abdominal pain. Reports hx hiatal hernia, scheduled surgery in May, pain progressively getting worse over past 4 days. Reports chronic nausea and vomiting. Reports using phenergan suppository and Zofran ODT with no relief.  ?

## 2021-09-16 NOTE — ED Provider Notes (Signed)
?[facility] EMERGENCY DEPT ?Provider Note ? ? ?CSN: [id number] ?Arrival date & time: 09/16/21  1204 ? ?  ? ?History ? ?Chief Complaint  ?Patient presents with  ? Abdominal Pain  ? ? ?CIERRA ROTHGEB is a 36 y.o. female. ? ?36 year old female with history of severe GERD who is can have surgery next month presents with recurrent nausea and vomiting.  States this is similar to what she had for the past.  Has had some watery diarrhea.  No fever appreciated.  No urinary symptoms.  Symptoms unresponsive to home medications.  Over the last 4 days, she states it is worse.  Nothing makes it better ? ? ?  ? ?Home Medications ?Prior to Admission medications   ?Medication Sig Start Date End Date Taking? Authorizing Provider  ?acetaminophen (TYLENOL) 500 MG tablet Take 2 tablets (1,000 mg total) by mouth every 6 (six) hours as needed for moderate pain. 02/13/19   Fayrene Helper, PA-C  ?busPIRone (BUSPAR) 15 MG tablet Take 15 mg by mouth 3 (three) times daily. 11/01/19   [provider]  ?CALCIUM PO Take 1 tablet by mouth daily.    [provider]  ?clotrimazole (MYCELEX) 10 MG troche Take 10 mg by mouth 5 (five) times daily. 09/24/20   [provider]  ?cyanocobalamin (,VITAMIN B-12,) 1000 MCG/ML injection Inject 1 vial per week for 3 weeks, then 1 vial per month thereafter 06/21/20   Willow Ora, MD  ?hydrOXYzine (VISTARIL) 50 MG capsule Take 50 mg by mouth every 6 (six) hours as needed. 12/26/20   [provider]  ?hyoscyamine (LEVSIN) 0.125 MG tablet Take 1 tablet (0.125 mg total) by mouth every 6 (six) hours as needed for Cramping for up to 10 days 08/11/21   Haskel Schroeder, PA-C  ?lamoTRIgine (LAMICTAL) 150 MG tablet Take 300 mg by mouth daily.  04/10/20   [provider]  ?lithium carbonate (LITHOBID) 300 MG CR tablet Take 600 mg by mouth at bedtime.    [provider]  ?Magnesium 500 MG TABS Take 1 tablet by mouth daily.    [provider]   ?metFORMIN (GLUCOPHAGE) 500 MG tablet Take 500 mg by mouth 2 (two) times daily. 11/01/19   [provider]  ?methocarbamol (ROBAXIN) 500 MG tablet Take 1 tablet (500 mg total) by mouth at bedtime as needed for muscle spasms. 07/12/21   Rodriguez-Southworth, Nettie Elm, PA-C  ?metoCLOPramide (REGLAN) 10 MG tablet Take 1 tablet (10 mg total) by mouth every 6 (six) hours. 08/21/21   Couture, Cortni S, PA-C  ?nortriptyline (PAMELOR) 10 MG capsule TAKE 1 CAPSULE BY MOUTH AT BEDTIME. 04/04/21   Kirsteins, Victorino Sparrow, MD  ?ondansetron (ZOFRAN) 4 MG tablet Take 2 tablets (8 mg total) by mouth every 8 (eight) hours as needed for nausea or vomiting. 07/21/21   Karie Soda, MD  ?pantoprazole (PROTONIX) 20 MG tablet TAKE 1 TABLET BY MOUTH EVERY DAY 06/21/21   Willow Ora, MD  ?promethazine (PHENERGAN) 25 MG suppository Place 1 suppository (25 mg total) rectally every 6 (six) hours as needed for nausea. 07/21/21   Karie Soda, MD  ?promethazine (PHENERGAN) 6.25 MG/5ML syrup Take 10 mLs (12.5 mg total) by mouth every 6 (six) hours as needed for Nausea for up to 14 days 07/21/21   Karie Soda, MD  ?QUEtiapine (SEROQUEL) 400 MG tablet Take 400 mg by mouth at bedtime.  08/24/19   [provider]  ?QUEtiapine (SEROQUEL) 50 MG tablet Take by mouth. 06/27/20   [provider]  ?SYRINGE-NEEDLE, DISP, 3 ML 25G X 1" 3 ML MISC Use 1 needle per application once weekly 06/21/20   Willow Ora, MD  ?   ? ?Allergies    ?Nsaids   ? ?Review of Systems   ?Review of Systems  ?All other systems reviewed and are negative. ? ?Physical Exam ?Updated Vital Signs ?BP 138/86 (BP Location: Right Arm)   Pulse 100   Temp 97.9 ?F (36.6 ?C)   Resp 18   Ht 1.626 m (5\' 4" )   Wt 116.1 kg   SpO2 99%   BMI 43.94 kg/m?  ?Physical Exam ?Vitals and nursing note reviewed.  ?Constitutional:   ?   General: She is not in acute distress. ?   Appearance: Normal appearance. She is well-developed. She is not toxic-appearing.  ?HENT:  ?   Head:  Normocephalic and atraumatic.  ?Eyes:  ?   General: Lids are normal.  ?   Conjunctiva/sclera: Conjunctivae normal.  ?   Pupils: Pupils are equal, round, and reactive to light.  ?Neck:  ?   Thyroid: No thyroid mass.  ?   Trachea: No tracheal deviation.  ?Cardiovascular:  ?   Rate and Rhythm: Normal rate and regular rhythm.  ?   Heart sounds: Normal heart sounds. No murmur heard. ?  No gallop.  ?Pulmonary:  ?   Effort: Pulmonary effort is normal. No respiratory distress.  ?   Breath sounds: Normal breath sounds. No stridor. No decreased breath sounds, wheezing, rhonchi or rales.  ?Abdominal:  ?   General: There is no distension.  ?   Palpations: Abdomen is soft.  ?   Tenderness: There is no abdominal tenderness. There is no guarding or rebound.  ? ? ?Musculoskeletal:     ?   General: No tenderness. Normal range of motion.  ?   Cervical back: Normal range of motion and neck supple.  ?Skin: ?   General: Skin is warm and dry.  ?   Findings: No abrasion or rash.  ?Neurological:  ?   Mental Status: She is alert and oriented to person, place, and time. Mental status is at baseline.  ?   GCS: GCS eye subscore is 4. GCS verbal subscore is 5. GCS motor subscore is 6.  ?   Cranial Nerves: No cranial nerve deficit.  ?   Sensory: No sensory deficit.  ?   Motor: Motor function is intact.  ?Psychiatric:     ?   Attention and Perception: Attention normal.     ?   Speech: Speech normal.     ?   Behavior: Behavior normal.  ? ? ?ED Results / Procedures / Treatments   ?Labs ?(all labs ordered are listed, but only abnormal results are displayed) ?Labs Reviewed  ?CBC WITH DIFFERENTIAL/PLATELET  ?COMPREHENSIVE METABOLIC PANEL  ?LIPASE, BLOOD  ? ? ?EKG ?None ? ?Radiology ?No results found. ? ?Procedures ?Procedures  ? ? ?Medications Ordered in ED ?Medications  ?lactated ringers bolus 2,000 mL (has no administration in time range)  ?lactated ringers infusion (has no administration in time range)  ?metoCLOPramide (REGLAN) injection 10 mg (has  no administration in time range)  ?morphine (PF) 4 MG/ML injection 6 mg (has no administration in time range)  ?pantoprazole (PROTONIX) injection 40 mg (has no administration in time range)  ? ? ?ED Course/ Medical Decision Making/ A&P ?  ?                        ?  Medical Decision Making ?Amount and/or Complexity of Data Reviewed ?Labs: ordered. ? ?Risk ?Prescription drug management. ? ? ?Patient presented with abdominal discomfort and emesis consistent with her prior history of severe GERD.  Patient does not have any signs of having an acute abdomen at this time.  She is afebrile.  T was performed and are reassuring here.  No evidence of leukocytosis.  No evidence of pancreatitis.  Treat with IV fluids for suspected dehydration.  Also given antiemetics and pain medication as well as Protonix and patient does get relief of his symptoms.  Considered abdominal imaging but given patient's chronic symptoms do not feel this is indicated.  Will discharge home at this time and patient to see her GI doctor in follow-up ? ? ? ? ? ? ? ?Final Clinical Impression(s) / ED Diagnoses ?Final diagnoses:  ?None  ? ? ?Rx / DC Orders ?ED Discharge Orders   ? ? None  ? ?  ? ? ?  ?Lorre NickAllen, Derian Dimalanta, MD ?09/16/21 1349 ? ?

## 2021-09-17 ENCOUNTER — Other Ambulatory Visit (HOSPITAL_BASED_OUTPATIENT_CLINIC_OR_DEPARTMENT_OTHER): Payer: Self-pay

## 2021-09-19 NOTE — Progress Notes (Signed)
Pt. Needs orders for upcoming surgery. ?

## 2021-09-19 NOTE — Patient Instructions (Addendum)
DUE TO COVID-19 ONLY ONE VISITOR  (aged 38 and older)  IS ALLOWED TO COME WITH YOU AND STAY IN THE WAITING ROOM ONLY DURING PRE OP AND PROCEDURE.   ?**NO VISITORS ARE ALLOWED IN THE SHORT STAY AREA OR RECOVERY ROOM!!** ? ?IF YOU WILL BE ADMITTED INTO THE HOSPITAL YOU ARE ALLOWED ONLY TWO SUPPORT PEOPLE DURING VISITATION HOURS ONLY (7 AM -8PM)   ?The support person(s) must pass our screening, gel in and out, and wear a mask at all times, including in the patient?s room. ?Patients must also wear a mask when staff or their support person are in the room. ?Visitors GUEST BADGE MUST BE WORN VISIBLY  ?One adult visitor may remain with you overnight and MUST be in the room by 8 P.M. ?  ? ? Your procedure is scheduled on: 10/18/21 ? ? [facility] ? ?  Report to admitting at : 7:15 AM ? ? Call this number if you have problems the morning of surgery [phone] ? ? Do not eat food :After Midnight. ? ? After Midnight you may have the following liquids until : 6:30 AM DAY OF SURGERY ? ?Water ?Black Coffee (sugar ok, NO MILK/CREAM OR CREAMERS)  ?Tea (sugar ok, NO MILK/CREAM OR CREAMERS) regular and decaf                             ?Plain Jell-O (NO RED)                                           ?Fruit ices (not with fruit pulp, NO RED)                                     ?Popsicles (NO RED)                                                                  ?Juice: apple, WHITE grape, WHITE cranberry ?Sports drinks like Gatorade (NO RED) ?Clear broth(vegetable,chicken,beef) ? ?             ?Drink Gatorade drink AT 6:30 AM the day of surgery.   ?  ?  ?The day of surgery:  ?Drink ONE (1) Pre-Surgery Clear Ensure or G2 at AM the morning of surgery. Drink in one sitting. Do not sip.  ?This drink was given to you during your hospital  ?pre-op appointment visit. ?Nothing else to drink after completing the  ?Pre-Surgery Clear Ensure or G2. ?  ?       If you have questions, please contact your surgeon?s  office. ? ? ?FOLLOW BOWEL PREP AND ANY ADDITIONAL PRE OP INSTRUCTIONS YOU RECEIVED FROM YOUR SURGEON'S OFFICE!!! ?  ?  ?Oral Hygiene is also important to reduce your risk of infection.                                    ?Remember - BRUSH YOUR TEETH THE MORNING OF SURGERY  WITH YOUR REGULAR TOOTHPASTE ? ? Do NOT smoke after Midnight ? ? Take these medicines the morning of surgery with A SIP OF WATER: buspirone,lamotrigine,pantoprazole.Tylenol,hydroxyzine as needed. ? ?DO NOT TAKE ANY ORAL DIABETIC MEDICATIONS DAY OF YOUR SURGERY ? ?Bring CPAP mask and tubing day of surgery. ?                  ?           You may not have any metal on your body including hair pins, jewelry, and body piercing ? ?           Do not wear make-up, lotions, powders, perfumes/cologne, or deodorant ? ?Do not wear nail polish including gel and S&S, artificial/acrylic nails, or any other type of covering on natural nails including finger and toenails. If you have artificial nails, gel coating, etc. that needs to be removed by a nail salon please have this removed prior to surgery or surgery may need to be canceled/ delayed if the surgeon/ anesthesia feels like they are unable to be safely monitored.  ? ?Do not shave  48 hours prior to surgery.  ? ? Do not bring valuables to the hospital. [facility] IS NOT ?            RESPONSIBLE   FOR VALUABLES. ? ? Contacts, dentures or bridgework may not be worn into surgery. ? ? Bring small overnight bag day of surgery. ?  ? Patients discharged on the day of surgery will not be allowed to drive home.  Someone NEEDS to stay with you for the first 24 hours after anesthesia. ? ? Special Instructions: Bring a copy of your healthcare power of attorney and living will documents         the day of surgery if you haven't scanned them before. ? ?            Please read over the following fact sheets you were given: IF YOU HAVE QUESTIONS ABOUT YOUR PRE-OP INSTRUCTIONS PLEASE CALL [phone] ? ?   [facility] -  Preparing for Surgery ?Before surgery, you can play an important role.  Because skin is not sterile, your skin needs to be as free of germs as possible.  You can reduce the number of germs on your skin by washing with CHG (chlorahexidine gluconate) soap before surgery.  CHG is an antiseptic cleaner which kills germs and bonds with the skin to continue killing germs even after washing. ?Please DO NOT use if you have an allergy to CHG or antibacterial soaps.  If your skin becomes reddened/irritated stop using the CHG and inform your nurse when you arrive at Short Stay. ?Do not shave (including legs and underarms) for at least 48 hours prior to the first CHG shower.  You may shave your face/neck. ?Please follow these instructions carefully: ? 1.  Shower with CHG Soap the night before surgery and the  morning of Surgery. ? 2.  If you choose to wash your hair, wash your hair first as usual with your  normal  shampoo. ? 3.  After you shampoo, rinse your hair and body thoroughly to remove the  shampoo.                           4.  Use CHG as you would any other liquid soap.  You can apply chg directly  to the skin and wash  ?  Gently with a scrungie or clean washcloth. ? 5.  Apply the CHG Soap to your body ONLY FROM THE NECK DOWN.   Do not use on face/ open      ?                     Wound or open sores. Avoid contact with eyes, ears mouth and genitals (private parts).  ?                     Engineering geologist,  Genitals (private parts) with your normal soap. ?            6.  Wash thoroughly, paying special attention to the area where your surgery  will be performed. ? 7.  Thoroughly rinse your body with warm water from the neck down. ? 8.  DO NOT shower/wash with your normal soap after using and rinsing off  the CHG Soap. ?               9.  Pat yourself dry with a clean towel. ?           10.  Wear clean pajamas. ?           11.  Place clean sheets on your bed the night of your first shower and do not  sleep  with pets. ?Day of Surgery : ?Do not apply any lotions/deodorants the morning of surgery.  Please wear clean clothes to the hospital/surgery center. ? ?FAILURE TO FOLLOW THESE INSTRUCTIONS MAY RESULT IN THE CANCELLATION OF YOUR SURGERY ?PATIENT SIGNATURE_________________________________ ? ?NURSE SIGNATURE__________________________________ ? ?________________________________________________________________________  ?

## 2021-09-22 ENCOUNTER — Other Ambulatory Visit: Payer: Self-pay

## 2021-09-22 ENCOUNTER — Ambulatory Visit: Payer: Self-pay | Admitting: General Surgery

## 2021-09-22 DIAGNOSIS — Z9884 Bariatric surgery status: Secondary | ICD-10-CM

## 2021-09-23 ENCOUNTER — Encounter (HOSPITAL_COMMUNITY): Payer: Self-pay

## 2021-09-23 ENCOUNTER — Other Ambulatory Visit: Payer: Self-pay

## 2021-09-23 ENCOUNTER — Encounter (HOSPITAL_COMMUNITY)
Admission: RE | Admit: 2021-09-23 | Discharge: 2021-09-23 | Disposition: A | Discharge status: Home / Self Care | Payer: 59 | Source: Ambulatory Visit | Attending: General Surgery | Admitting: General Surgery

## 2021-09-23 VITALS — BP 110/79 | HR 94 | Temp 97.9°F | Resp 16 | Ht 64.0 in | Wt 245.0 lb

## 2021-09-23 DIAGNOSIS — R7303 Prediabetes: Secondary | ICD-10-CM | POA: Insufficient documentation

## 2021-09-23 DIAGNOSIS — I2510 Atherosclerotic heart disease of native coronary artery without angina pectoris: Secondary | ICD-10-CM | POA: Diagnosis not present

## 2021-09-23 DIAGNOSIS — Z9884 Bariatric surgery status: Secondary | ICD-10-CM | POA: Insufficient documentation

## 2021-09-23 DIAGNOSIS — Z01818 Encounter for other preprocedural examination: Secondary | ICD-10-CM | POA: Insufficient documentation

## 2021-09-23 HISTORY — DX: Pneumonia, unspecified organism: J18.9

## 2021-09-23 HISTORY — DX: Unspecified osteoarthritis, unspecified site: M19.90

## 2021-09-23 HISTORY — DX: Personal history of urinary calculi: Z87.442

## 2021-09-23 LAB — CBC WITH DIFFERENTIAL/PLATELET
Abs Immature Granulocytes: 0.02 10*3/uL (ref 0.00–0.07)
Basophils Absolute: 0 10*3/uL (ref 0.0–0.1)
Basophils Relative: 0 %
Eosinophils Absolute: 0.2 10*3/uL (ref 0.0–0.5)
Eosinophils Relative: 2 %
HCT: 38.7 % (ref 36.0–46.0)
Hemoglobin: 12.4 g/dL (ref 12.0–15.0)
Immature Granulocytes: 0 %
Lymphocytes Relative: 27 %
Lymphs Abs: 2.7 10*3/uL (ref 0.7–4.0)
MCH: 27.9 pg (ref 26.0–34.0)
MCHC: 32 g/dL (ref 30.0–36.0)
MCV: 87 fL (ref 80.0–100.0)
Monocytes Absolute: 0.6 10*3/uL (ref 0.1–1.0)
Monocytes Relative: 6 %
Neutro Abs: 6.5 10*3/uL (ref 1.7–7.7)
Neutrophils Relative %: 65 %
Platelets: 374 10*3/uL (ref 150–400)
RBC: 4.45 MIL/uL (ref 3.87–5.11)
RDW: 14.1 % (ref 11.5–15.5)
WBC: 10 10*3/uL (ref 4.0–10.5)
nRBC: 0 % (ref 0.0–0.2)

## 2021-09-23 LAB — HEMOGLOBIN A1C
Hgb A1c MFr Bld: 4.5 % — ABNORMAL LOW (ref 4.8–5.6)
Mean Plasma Glucose: 82.45 mg/dL

## 2021-09-23 LAB — COMPREHENSIVE METABOLIC PANEL
ALT: 11 U/L (ref 0–44)
AST: 14 U/L — ABNORMAL LOW (ref 15–41)
Albumin: 3.7 g/dL (ref 3.5–5.0)
Alkaline Phosphatase: 116 U/L (ref 38–126)
Anion gap: 6 (ref 5–15)
BUN: 12 mg/dL (ref 6–20)
CO2: 25 mmol/L (ref 22–32)
Calcium: 8.9 mg/dL (ref 8.9–10.3)
Chloride: 109 mmol/L (ref 98–111)
Creatinine, Ser: 0.72 mg/dL (ref 0.44–1.00)
GFR, Estimated: >60 mL/min (ref >60)
Glucose, Bld: 84 mg/dL (ref 70–99)
Potassium: 4 mmol/L (ref 3.5–5.1)
Sodium: 140 mmol/L (ref 135–145)
Total Bilirubin: 0.3 mg/dL (ref 0.3–1.2)
Total Protein: 6.5 g/dL (ref 6.5–8.1)

## 2021-09-23 NOTE — Progress Notes (Signed)
For Short Stay: ?COVID SWAB appointment date: ?Date of COVID positive in last 90 days: ?COVID Vaccine: Pfizer x 3: 12/24/19 ?Bowel Prep reminder: N/A ? ? ?For Anesthesia: ?PCP - Dr. Asencion Partridge. LOV: 09/04/21 ?Cardiologist -  ? ?Chest x-ray -  ?EKG -  ?Stress Test -  ?ECHO -  ?Cardiac Cath -  ?Pacemaker/ICD device last checked: ?Pacemaker orders received: ?Device Rep notified: ? ?Spinal Cord Stimulator: ? ?Sleep Study -  ?CPAP -  ? ?Fasting Blood Sugar - N/A ?Checks Blood Sugar ___0__ times a day ?Date and result of last Hgb A1c- ? ?Blood Thinner Instructions: ?Aspirin Instructions: ?Last Dose: ? ?Activity level: Can go up a flight of stairs and activities of daily living without stopping and without chest pain and/or shortness of breath ?  Able to exercise without chest pain and/or shortness of breath ?  Unable to go up a flight of stairs without chest pain and/or shortness of breath ?   ? ?Anesthesia review: Hx: HTN,palpitations both during pregnancy. ? ?Patient denies shortness of breath, fever, cough and chest pain at PAT appointment ? ? ?Patient verbalized understanding of instructions that were given to them at the PAT appointment. Patient was also instructed that they will need to review over the PAT instructions again at home before surgery.  ?

## 2021-09-24 ENCOUNTER — Other Ambulatory Visit (HOSPITAL_BASED_OUTPATIENT_CLINIC_OR_DEPARTMENT_OTHER): Payer: Self-pay

## 2021-09-25 ENCOUNTER — Other Ambulatory Visit (HOSPITAL_BASED_OUTPATIENT_CLINIC_OR_DEPARTMENT_OTHER): Payer: Self-pay

## 2021-09-29 ENCOUNTER — Encounter: Payer: Self-pay | Admitting: Family Medicine

## 2021-09-30 ENCOUNTER — Other Ambulatory Visit (HOSPITAL_BASED_OUTPATIENT_CLINIC_OR_DEPARTMENT_OTHER): Payer: Self-pay

## 2021-10-01 ENCOUNTER — Encounter: Payer: Self-pay | Admitting: Physical Medicine & Rehabilitation

## 2021-10-01 ENCOUNTER — Encounter: Payer: 59 | Attending: Physical Medicine & Rehabilitation | Admitting: Physical Medicine & Rehabilitation

## 2021-10-01 VITALS — BP 133/94 | HR 94 | Ht 64.0 in | Wt 246.0 lb

## 2021-10-01 DIAGNOSIS — Z79899 Other long term (current) drug therapy: Secondary | ICD-10-CM | POA: Insufficient documentation

## 2021-10-01 DIAGNOSIS — M25561 Pain in right knee: Secondary | ICD-10-CM | POA: Diagnosis present

## 2021-10-01 DIAGNOSIS — M7918 Myalgia, other site: Secondary | ICD-10-CM | POA: Insufficient documentation

## 2021-10-01 MED ORDER — NORTRIPTYLINE HCL 10 MG PO CAPS: 10.0000 mg | ORAL_CAPSULE | Freq: Every day | ORAL | 1 refills | Status: DC

## 2021-10-01 NOTE — Progress Notes (Signed)
Knee injection, suprapatellar bursa ?Right (with ultrasound guidance) ? ?Indication:Right Knee pain not relieved by medication management and other conservative care. ? ?Informed consent was obtained after describing risks and benefits of the procedure with the patient, this includes bleeding, bruising, infection and medication side effects. The patient wishes to proceed and has given written consent. The patient was placed in a recumbent position. The medial aspect of the knee was marked and prepped with Betadine and alcohol. It was then entered with a 25-gauge 1-1/2 inch needle and 1 mL of 1% lidocaine was injected into the skin and subcutaneous tissue. Then same 25g 1.5 in needle was inserted into the knee joint under direct long axis US guidance using 5hz  curvilinear transducer . After negative draw back for blood, a solution containing one ML of 6mg  per mL betamethasone and 3 mL of 1% lidocaine were injected. The patient tolerated the procedure well. Post procedure instructions were given. ? ?

## 2021-10-05 ENCOUNTER — Encounter (HOSPITAL_BASED_OUTPATIENT_CLINIC_OR_DEPARTMENT_OTHER): Payer: Self-pay | Admitting: Emergency Medicine

## 2021-10-05 ENCOUNTER — Emergency Department (HOSPITAL_BASED_OUTPATIENT_CLINIC_OR_DEPARTMENT_OTHER)
Admission: EM | Admit: 2021-10-05 | Discharge: 2021-10-05 | Disposition: A | Discharge status: Home / Self Care | Payer: 59 | Attending: Emergency Medicine | Admitting: Emergency Medicine

## 2021-10-05 ENCOUNTER — Other Ambulatory Visit: Payer: Self-pay

## 2021-10-05 DIAGNOSIS — R1013 Epigastric pain: Secondary | ICD-10-CM | POA: Insufficient documentation

## 2021-10-05 DIAGNOSIS — R1012 Left upper quadrant pain: Secondary | ICD-10-CM | POA: Insufficient documentation

## 2021-10-05 DIAGNOSIS — R112 Nausea with vomiting, unspecified: Secondary | ICD-10-CM | POA: Insufficient documentation

## 2021-10-05 DIAGNOSIS — R1010 Upper abdominal pain, unspecified: Secondary | ICD-10-CM | POA: Diagnosis present

## 2021-10-05 LAB — URINALYSIS, ROUTINE W REFLEX MICROSCOPIC
Bilirubin Urine: NEGATIVE
Glucose, UA: NEGATIVE mg/dL
Hgb urine dipstick: NEGATIVE
Ketones, ur: NEGATIVE mg/dL
Leukocytes,Ua: NEGATIVE
Nitrite: NEGATIVE
Specific Gravity, Urine: 1.024 (ref 1.005–1.030)
pH: 8 (ref 5.0–8.0)

## 2021-10-05 LAB — CBC
HCT: 41 % (ref 36.0–46.0)
Hemoglobin: 13.3 g/dL (ref 12.0–15.0)
MCH: 27.2 pg (ref 26.0–34.0)
MCHC: 32.4 g/dL (ref 30.0–36.0)
MCV: 83.8 fL (ref 80.0–100.0)
Platelets: 322 10*3/uL (ref 150–400)
RBC: 4.89 MIL/uL (ref 3.87–5.11)
RDW: 14.2 % (ref 11.5–15.5)
WBC: 7.4 10*3/uL (ref 4.0–10.5)
nRBC: 0 % (ref 0.0–0.2)

## 2021-10-05 LAB — COMPREHENSIVE METABOLIC PANEL
ALT: 14 U/L (ref 0–44)
AST: 11 U/L — ABNORMAL LOW (ref 15–41)
Albumin: 4.5 g/dL (ref 3.5–5.0)
Alkaline Phosphatase: 121 U/L (ref 38–126)
Anion gap: 9 (ref 5–15)
BUN: 13 mg/dL (ref 6–20)
CO2: 27 mmol/L (ref 22–32)
Calcium: 9.5 mg/dL (ref 8.9–10.3)
Chloride: 104 mmol/L (ref 98–111)
Creatinine, Ser: 0.72 mg/dL (ref 0.44–1.00)
GFR, Estimated: >60 mL/min (ref >60)
Glucose, Bld: 88 mg/dL (ref 70–99)
Potassium: 3.8 mmol/L (ref 3.5–5.1)
Sodium: 140 mmol/L (ref 135–145)
Total Bilirubin: 0.3 mg/dL (ref 0.3–1.2)
Total Protein: 7.2 g/dL (ref 6.5–8.1)

## 2021-10-05 LAB — LIPASE, BLOOD: Lipase: 25 U/L (ref 11–51)

## 2021-10-05 LAB — PREGNANCY, URINE: Preg Test, Ur: NEGATIVE

## 2021-10-05 MED ORDER — SODIUM CHLORIDE 0.9 % IV BOLUS
1000.0000 mL | Freq: Once | INTRAVENOUS | Status: AC
  Administered 2021-10-05: 1000 mL via INTRAVENOUS

## 2021-10-05 MED ORDER — LIDOCAINE VISCOUS HCL 2 % MT SOLN
15.0000 mL | Freq: Once | OROMUCOSAL | Status: AC
  Administered 2021-10-05: 15 mL via ORAL
  Filled 2021-10-05: qty 15

## 2021-10-05 MED ORDER — METOCLOPRAMIDE HCL 10 MG PO TABS: 10.0000 mg | ORAL_TABLET | Freq: Four times a day (QID) | ORAL | 0 refills | Status: DC

## 2021-10-05 MED ORDER — FENTANYL CITRATE PF 50 MCG/ML IJ SOSY
100.0000 ug | PREFILLED_SYRINGE | Freq: Once | INTRAMUSCULAR | Status: AC
  Administered 2021-10-05: 100 ug via INTRAVENOUS
  Filled 2021-10-05: qty 2

## 2021-10-05 MED ORDER — METOCLOPRAMIDE HCL 5 MG/ML IJ SOLN
10.0000 mg | Freq: Once | INTRAMUSCULAR | Status: AC
  Administered 2021-10-05: 10 mg via INTRAVENOUS
  Filled 2021-10-05: qty 2

## 2021-10-05 MED ORDER — ALUM & MAG HYDROXIDE-SIMETH 200-200-20 MG/5ML PO SUSP
30.0000 mL | Freq: Once | ORAL | Status: AC
  Administered 2021-10-05: 30 mL via ORAL
  Filled 2021-10-05: qty 30

## 2021-10-05 MED ORDER — DIPHENHYDRAMINE HCL 50 MG/ML IJ SOLN
25.0000 mg | Freq: Once | INTRAMUSCULAR | Status: AC
  Administered 2021-10-05: 25 mg via INTRAVENOUS
  Filled 2021-10-05: qty 1

## 2021-10-05 NOTE — ED Notes (Signed)
Pt is able to drink water without any complications. Pt denies any nausea.  ?

## 2021-10-05 NOTE — ED Triage Notes (Signed)
Pt last took 8mg  zofran ODT at approx 12pm today. ?

## 2021-10-05 NOTE — Discharge Instructions (Addendum)
Please read and follow all provided instructions. ? ?Your diagnoses today include:  ?1. Epigastric pain   ?2. Nausea and vomiting, unspecified vomiting type   ? ? ?Tests performed today include: ?Blood cell counts and platelets ?Kidney and liver function tests ?Pancreas function test (called lipase) ?Urine test to look for infection ?A blood or urine test for pregnancy (women only) ?Vital signs. See below for your results today.  ? ?Medications prescribed:  ?Reglan (metoclopramide) - for nausea and vomiting ? ?Take any prescribed medications only as directed. ? ?Home care instructions:  ?Follow any educational materials contained in this packet. ? ?Follow-up instructions: ?Please follow-up with your primary care provider in the next 3 days for further evaluation of your symptoms.   ? ?Return instructions:  ?SEEK IMMEDIATE MEDICAL ATTENTION IF: ?The pain does not go away or becomes severe  ?A temperature above 101F develops  ?Repeated vomiting occurs (multiple episodes)  ?The pain becomes localized to portions of the abdomen. The right side could possibly be appendicitis. In an adult, the left lower portion of the abdomen could be colitis or diverticulitis.  ?Blood is being passed in stools or vomit (bright red or black tarry stools)  ?You develop chest pain, difficulty breathing, dizziness or fainting, or become confused, poorly responsive, or inconsolable (young children) ?If you have any other emergent concerns regarding your health ? ?Additional Information: ?Abdominal (belly) pain can be caused by many things. Your caregiver performed an examination and possibly ordered blood/urine tests and imaging (CT scan, x-rays, ultrasound). Many cases can be observed and treated at home after initial evaluation in the emergency department. Even though you are being discharged home, abdominal pain can be unpredictable. Therefore, you need a repeated exam if your pain does not resolve, returns, or worsens. Most patients with  abdominal pain don't have to be admitted to the hospital or have surgery, but serious problems like appendicitis and gallbladder attacks can start out as nonspecific pain. Many abdominal conditions cannot be diagnosed in one visit, so follow-up evaluations are very important. ? ?Your vital signs today were: ?BP (!) 140/91 (BP Location: Right Arm)   Pulse 88   Temp 98.6 ?F (37 ?C)   Resp 18   LMP 09/13/2021 (Approximate)   SpO2 95%  ?If your blood pressure (bp) was elevated above 135/85 this visit, please have this repeated by your doctor within one month. ?-------------- ? ?

## 2021-10-05 NOTE — ED Triage Notes (Signed)
Pt states abdominal hernia repair sx scheduled for 2 weeks from now, but that pain is worse the past 2 days and unrelieved by home medications.  +nausea & vomiting. ?

## 2021-10-05 NOTE — ED Provider Notes (Signed)
?[facility] ?Provider Note ? ? ?CSN: [id number] ?Arrival date & time: 10/05/21  1734 ? ?  ? ?History ? ?Chief Complaint  ?Patient presents with  ? Abdominal Pain  ? ? ?[name] [name] is a 36 y.o. female. ? ?Patient with history of Roux-en-Y gastric bypass surgery, cholecystectomy, severe gastric reflux and hiatus hernia --presents to the emergency department today for evaluation of upper abdominal pain and vomiting.  Patient has hiatal hernia repair scheduled in about 10 days.  Symptoms have been worse over the past 2 days with more persistent vomiting and burning pain in the upper abdomen and left side.  No fevers.  She has been taking Zofran at home without improvement.  She also takes Protonix, Tums, and promethazine.  No diarrhea or lower abdominal pain.  No difficulty with urination.  Symptoms were severe enough where she decided to come into the emergency department tonight for evaluation.  ? ? ?  ? ?Home Medications ?Prior to Admission medications   ?Medication Sig Start Date End Date Taking? Authorizing Provider  ?acetaminophen (TYLENOL) 500 MG tablet Take 2 tablets (1,000 mg total) by mouth every 6 (six) hours as needed for moderate pain. 02/13/19   Domenic Moras, PA-C  ?busPIRone (BUSPAR) 15 MG tablet Take 15 mg by mouth 3 (three) times daily. 11/01/19   [provider]  ?clotrimazole (MYCELEX) 10 MG troche Take 10 mg by mouth daily as needed (Mouth Rash). 09/24/20   [provider]  ?cyanocobalamin (,VITAMIN B-12,) 1000 MCG/ML injection Inject 1 vial per week for 3 weeks, then 1 vial per month thereafter 06/21/20   Leamon Arnt, MD  ?hydrOXYzine (VISTARIL) 50 MG capsule Take 50 mg by mouth in the morning, at noon, in the evening, and at bedtime. 12/26/20   [provider]  ?lamoTRIgine (LAMICTAL) 150 MG tablet Take 300 mg by mouth in the morning. 04/10/20   [provider]  ?lithium carbonate (LITHOBID) 300 MG CR tablet Take 300 mg by mouth at  bedtime.    [provider]  ?Magnesium 500 MG TABS Take 500 mg by mouth daily.    [provider]  ?metFORMIN (GLUCOPHAGE) 500 MG tablet Take 500 mg by mouth 2 (two) times daily. 11/01/19   [provider]  ?nortriptyline (PAMELOR) 10 MG capsule Take 1 capsule (10 mg total) by mouth at bedtime. 10/01/21   Kirsteins, Luanna Salk, MD  ?ondansetron (ZOFRAN) 4 MG tablet Take 2 tablets (8 mg total) by mouth every 8 (eight) hours as needed for nausea or vomiting. 07/21/21   Michael Boston, MD  ?ondansetron (ZOFRAN-ODT) 4 MG disintegrating tablet Take 4 mg by mouth every 8 (eight) hours as needed for nausea or vomiting.    [provider]  ?pantoprazole (PROTONIX) 20 MG tablet TAKE 1 TABLET BY MOUTH EVERY DAY ?Patient taking differently: 20 mg 2 (two) times daily. 06/21/21   Leamon Arnt, MD  ?promethazine (PHENERGAN) 25 MG suppository Place 1 suppository (25 mg total) rectally every 6 (six) hours as needed for nausea. 07/21/21   Michael Boston, MD  ?promethazine (PHENERGAN) 6.25 MG/5ML syrup Take 10 mLs (12.5 mg total) by mouth every 6 (six) hours as needed for Nausea for up to 14 days 07/21/21   Michael Boston, MD  ?QUEtiapine (SEROQUEL) 400 MG tablet Take 400 mg by mouth at bedtime. Take with (2)  50 mg for a total of 500 mg in bedtime 08/24/19   [provider]  ?QUEtiapine (SEROQUEL) 50 MG tablet Take  100 mg by mouth at bedtime. Take with 400  mg for a total of 500 mg 06/27/20   [provider]  ?SYRINGE-NEEDLE, DISP, 3 ML 25G X 1" 3 ML MISC Use 1 needle per application once weekly 06/21/20   Leamon Arnt, MD  ?tizanidine (ZANAFLEX) 2 MG capsule Take 4 mg by mouth 3 (three) times daily as needed for muscle spasms. 09/10/21   [provider]  ?valACYclovir (VALTREX) 1000 MG tablet Take 1,000 mg by mouth daily as needed (Cold sores).    [provider]  ?   ? ?Allergies    ?Nsaids   ? ?Review of Systems   ?Review of Systems ? ?Physical Exam ?Updated Vital  Signs ?BP (!) 147/96 (BP Location: Right Arm)   Pulse (!) 103   Temp 98.6 ?F (37 ?C)   Resp 16   LMP 09/13/2021 (Approximate)   SpO2 100%  ?Physical Exam ?Vitals and nursing note reviewed.  ?Constitutional:   ?   General: She is not in acute distress. ?   Appearance: She is well-developed.  ?HENT:  ?   Head: Normocephalic and atraumatic.  ?   Right Ear: External ear normal.  ?   Left Ear: External ear normal.  ?   Nose: Nose normal.  ?Eyes:  ?   Conjunctiva/sclera: Conjunctivae normal.  ?Cardiovascular:  ?   Rate and Rhythm: Normal rate and regular rhythm.  ?   Heart sounds: No murmur heard. ?Pulmonary:  ?   Effort: No respiratory distress.  ?   Breath sounds: No wheezing, rhonchi or rales.  ?Abdominal:  ?   Palpations: Abdomen is soft.  ?   Tenderness: There is abdominal tenderness in the epigastric area and left upper quadrant. There is no guarding or rebound.  ?Musculoskeletal:  ?   Cervical back: Normal range of motion and neck supple.  ?   Right lower leg: No edema.  ?   Left lower leg: No edema.  ?Skin: ?   General: Skin is warm and dry.  ?   Findings: No rash.  ?Neurological:  ?   General: No focal deficit present.  ?   Mental Status: She is alert. Mental status is at baseline.  ?   Motor: No weakness.  ?Psychiatric:     ?   Mood and Affect: Mood normal.  ? ? ?ED Results / Procedures / Treatments   ?Labs ?(all labs ordered are listed, but only abnormal results are displayed) ?Labs Reviewed  ?COMPREHENSIVE METABOLIC PANEL - Abnormal; Notable for the following components:  ?    Result Value  ? AST 11 (*)   ? All other components within normal limits  ?URINALYSIS, ROUTINE W REFLEX MICROSCOPIC - Abnormal; Notable for the following components:  ? APPearance HAZY (*)   ? Protein, ur TRACE (*)   ? Bacteria, UA FEW (*)   ? All other components within normal limits  ?LIPASE, BLOOD  ?CBC  ?PREGNANCY, URINE  ? ? ?EKG ?None ? ?Radiology ?No results found. ? ?Procedures ?Procedures  ? ? ?Medications Ordered in  ED ?Medications  ?sodium chloride 0.9 % bolus 1,000 mL (1,000 mLs Intravenous New Bag/Given 10/05/21 2034)  ?metoCLOPramide (REGLAN) injection 10 mg (10 mg Intravenous Given 10/05/21 2026)  ?diphenhydrAMINE (BENADRYL) injection 25 mg (25 mg Intravenous Given 10/05/21 2025)  ?alum & mag hydroxide-simeth (MAALOX/MYLANTA) 200-200-20 MG/5ML suspension 30 mL (30 mLs Oral Given 10/05/21 2025)  ?  And  ?lidocaine (XYLOCAINE) 2 % viscous mouth solution 15 mL (  15 mLs Oral Given 10/05/21 2024)  ?fentaNYL (SUBLIMAZE) injection 100 mcg (100 mcg Intravenous Given 10/05/21 2027)  ? ? ?ED Course/ Medical Decision Making/ A&P ?  ? ?Patient seen and examined. History obtained directly from patient.  ? ?Labs/EKG: Ordered CBC, CMP, lipase, UA. ? ?Imaging: None ordered.  Considered CT imaging however no signs of peritonitis.  Awaiting labs. ? ?Medications/Fluids: Ordered: Fluid bolus, Reglan, Benadryl, GI cocktail.  ? ?Most recent vital signs reviewed and are as follows: ?BP (!) 147/96 (BP Location: Right Arm)   Pulse (!) 103   Temp 98.6 ?F (37 ?C)   Resp 16   LMP 09/13/2021 (Approximate)   SpO2 100%  ? ?Initial impression: Epigastric pain and vomiting in setting of hiatal hernia, awaiting surgery. ? ?9:13 PM Reassessment performed. Patient appears improved after treatment.  States that the pain is improved and nausea is improved.  Will p.o. challenge with water. ? ?Labs personally reviewed and interpreted including: CBC, CMP, lipase unremarkable.  UA without significant signs of dehydration or infection. ? ?Reviewed pertinent lab work and imaging with patient at bedside. Questions answered.  ? ?Most current vital signs reviewed and are as follows: ?BP (!) 140/91 (BP Location: Right Arm)   Pulse 88   Temp 98.6 ?F (37 ?C)   Resp 18   LMP 09/13/2021 (Approximate)   SpO2 95%  ? ?Plan: Discharge to home.  ? ?Prescriptions written for: Reglan for nausea and vomiting, this seems to have worked well in the ED today ? ?Other home care  instructions discussed: Avoidance of triggers ? ?ED return instructions discussed: The patient was urged to return to the Emergency Department immediately with worsening of current symptoms, worsening abdominal pain, persistent vom

## 2021-10-18 ENCOUNTER — Ambulatory Visit (HOSPITAL_COMMUNITY): Payer: 59 | Admitting: Anesthesiology

## 2021-10-18 ENCOUNTER — Encounter (HOSPITAL_COMMUNITY): Payer: Self-pay | Admitting: General Surgery

## 2021-10-18 ENCOUNTER — Encounter (HOSPITAL_COMMUNITY)
Admission: RE | Disposition: A | Discharge status: Home / Self Care | Payer: Self-pay | Source: Ambulatory Visit | Attending: General Surgery

## 2021-10-18 ENCOUNTER — Other Ambulatory Visit: Payer: Self-pay

## 2021-10-18 ENCOUNTER — Observation Stay (HOSPITAL_COMMUNITY)
Admission: RE | Admit: 2021-10-18 | Discharge: 2021-10-20 | Disposition: A | Discharge status: Home / Self Care | Payer: 59 | Source: Ambulatory Visit | Attending: General Surgery | Admitting: General Surgery

## 2021-10-18 DIAGNOSIS — I2510 Atherosclerotic heart disease of native coronary artery without angina pectoris: Secondary | ICD-10-CM

## 2021-10-18 DIAGNOSIS — K449 Diaphragmatic hernia without obstruction or gangrene: Principal | ICD-10-CM | POA: Insufficient documentation

## 2021-10-18 DIAGNOSIS — K219 Gastro-esophageal reflux disease without esophagitis: Secondary | ICD-10-CM | POA: Insufficient documentation

## 2021-10-18 DIAGNOSIS — N289 Disorder of kidney and ureter, unspecified: Secondary | ICD-10-CM

## 2021-10-18 DIAGNOSIS — E039 Hypothyroidism, unspecified: Secondary | ICD-10-CM

## 2021-10-18 DIAGNOSIS — Z01818 Encounter for other preprocedural examination: Secondary | ICD-10-CM

## 2021-10-18 DIAGNOSIS — Z9884 Bariatric surgery status: Secondary | ICD-10-CM | POA: Insufficient documentation

## 2021-10-18 DIAGNOSIS — I10 Essential (primary) hypertension: Secondary | ICD-10-CM

## 2021-10-18 DIAGNOSIS — Z8719 Personal history of other diseases of the digestive system: Principal | ICD-10-CM

## 2021-10-18 HISTORY — PX: XI ROBOTIC ASSISTED HIATAL HERNIA REPAIR: SHX6889

## 2021-10-18 LAB — PREGNANCY, URINE: Preg Test, Ur: NEGATIVE

## 2021-10-18 SURGERY — REPAIR, HERNIA, HIATAL, ROBOT-ASSISTED
Anesthesia: General | Site: Abdomen

## 2021-10-18 MED ORDER — ACETAMINOPHEN 500 MG PO TABS
1000.0000 mg | ORAL_TABLET | Freq: Four times a day (QID) | ORAL | Status: DC
  Administered 2021-10-18 – 2021-10-20 (×7): 1000 mg via ORAL
  Filled 2021-10-18 (×8): qty 2

## 2021-10-18 MED ORDER — ALBUMIN HUMAN 5 % IV SOLN: INTRAVENOUS | Status: DC | PRN

## 2021-10-18 MED ORDER — FENTANYL CITRATE (PF) 250 MCG/5ML IJ SOLN
INTRAMUSCULAR | Status: AC
  Filled 2021-10-18: qty 5

## 2021-10-18 MED ORDER — BUPIVACAINE LIPOSOME 1.3 % IJ SUSP
INTRAMUSCULAR | Status: DC | PRN
  Administered 2021-10-18: 20 mL

## 2021-10-18 MED ORDER — HEPARIN SODIUM (PORCINE) 5000 UNIT/ML IJ SOLN
5000.0000 [IU] | Freq: Once | INTRAMUSCULAR | Status: AC
  Administered 2021-10-18: 5000 [IU] via SUBCUTANEOUS
  Filled 2021-10-18: qty 1

## 2021-10-18 MED ORDER — AMISULPRIDE (ANTIEMETIC) 5 MG/2ML IV SOLN: 10.0000 mg | Freq: Once | INTRAVENOUS | Status: DC | PRN

## 2021-10-18 MED ORDER — MIDAZOLAM HCL 5 MG/5ML IJ SOLN
INTRAMUSCULAR | Status: DC | PRN
  Administered 2021-10-18: 2 mg via INTRAVENOUS

## 2021-10-18 MED ORDER — HYDROMORPHONE HCL 1 MG/ML IJ SOLN
INTRAMUSCULAR | Status: AC
  Filled 2021-10-18: qty 1

## 2021-10-18 MED ORDER — LITHIUM CARBONATE ER 300 MG PO TBCR
300.0000 mg | EXTENDED_RELEASE_TABLET | Freq: Every day | ORAL | Status: DC
  Administered 2021-10-18 – 2021-10-19 (×2): 300 mg via ORAL
  Filled 2021-10-18 (×2): qty 1

## 2021-10-18 MED ORDER — CHLORHEXIDINE GLUCONATE 0.12 % MT SOLN
15.0000 mL | Freq: Once | OROMUCOSAL | Status: AC
  Administered 2021-10-18: 15 mL via OROMUCOSAL

## 2021-10-18 MED ORDER — HYDROMORPHONE HCL 1 MG/ML IJ SOLN
INTRAMUSCULAR | Status: DC | PRN
  Administered 2021-10-18 (×2): 1 mg via INTRAVENOUS

## 2021-10-18 MED ORDER — SODIUM CHLORIDE (PF) 0.9 % IJ SOLN
INTRAMUSCULAR | Status: DC | PRN
  Administered 2021-10-18: 50 mL

## 2021-10-18 MED ORDER — ONDANSETRON HCL 4 MG/2ML IJ SOLN
INTRAMUSCULAR | Status: AC
  Filled 2021-10-18: qty 2

## 2021-10-18 MED ORDER — DEXAMETHASONE SODIUM PHOSPHATE 4 MG/ML IJ SOLN: 4.0000 mg | INTRAMUSCULAR | Status: DC

## 2021-10-18 MED ORDER — NORTRIPTYLINE HCL 10 MG PO CAPS
10.0000 mg | ORAL_CAPSULE | Freq: Every day | ORAL | Status: DC
  Administered 2021-10-18 – 2021-10-19 (×2): 10 mg via ORAL
  Filled 2021-10-18 (×2): qty 1

## 2021-10-18 MED ORDER — MIDAZOLAM HCL 2 MG/2ML IJ SOLN
INTRAMUSCULAR | Status: AC
  Filled 2021-10-18: qty 2

## 2021-10-18 MED ORDER — FENTANYL CITRATE (PF) 100 MCG/2ML IJ SOLN
INTRAMUSCULAR | Status: AC
  Filled 2021-10-18: qty 2

## 2021-10-18 MED ORDER — CHLORHEXIDINE GLUCONATE CLOTH 2 % EX PADS: 6.0000 | MEDICATED_PAD | Freq: Once | CUTANEOUS | Status: DC

## 2021-10-18 MED ORDER — TIZANIDINE HCL 2 MG PO TABS
4.0000 mg | ORAL_TABLET | Freq: Three times a day (TID) | ORAL | Status: DC | PRN
  Administered 2021-10-18 – 2021-10-20 (×5): 4 mg via ORAL
  Filled 2021-10-18 (×12): qty 2

## 2021-10-18 MED ORDER — ALBUMIN HUMAN 5 % IV SOLN
INTRAVENOUS | Status: AC
  Filled 2021-10-18: qty 250

## 2021-10-18 MED ORDER — ROCURONIUM BROMIDE 10 MG/ML (PF) SYRINGE
PREFILLED_SYRINGE | INTRAVENOUS | Status: AC
  Filled 2021-10-18: qty 10

## 2021-10-18 MED ORDER — ACETAMINOPHEN 500 MG PO TABS
1000.0000 mg | ORAL_TABLET | ORAL | Status: AC
  Administered 2021-10-18: 1000 mg via ORAL
  Filled 2021-10-18: qty 2

## 2021-10-18 MED ORDER — ROCURONIUM BROMIDE 100 MG/10ML IV SOLN
INTRAVENOUS | Status: DC | PRN
  Administered 2021-10-18: 30 mg via INTRAVENOUS
  Administered 2021-10-18 (×2): 20 mg via INTRAVENOUS
  Administered 2021-10-18: 70 mg via INTRAVENOUS

## 2021-10-18 MED ORDER — LIDOCAINE HCL (CARDIAC) PF 100 MG/5ML IV SOSY
PREFILLED_SYRINGE | INTRAVENOUS | Status: DC | PRN
  Administered 2021-10-18: 50 mg via INTRAVENOUS

## 2021-10-18 MED ORDER — SUCCINYLCHOLINE CHLORIDE 200 MG/10ML IV SOSY
PREFILLED_SYRINGE | INTRAVENOUS | Status: DC | PRN
  Administered 2021-10-18: 140 mg via INTRAVENOUS

## 2021-10-18 MED ORDER — HYDROMORPHONE HCL 1 MG/ML IJ SOLN
0.5000 mg | INTRAMUSCULAR | Status: DC | PRN
  Administered 2021-10-18: 0.5 mg via INTRAVENOUS

## 2021-10-18 MED ORDER — KETAMINE HCL 100 MG/ML IJ SOLN
INTRAMUSCULAR | Status: DC | PRN
  Administered 2021-10-18: 20 mg via INTRAMUSCULAR

## 2021-10-18 MED ORDER — OXYCODONE HCL 5 MG PO TABS
5.0000 mg | ORAL_TABLET | ORAL | Status: DC | PRN
  Administered 2021-10-18 – 2021-10-20 (×10): 10 mg via ORAL
  Filled 2021-10-18 (×11): qty 2

## 2021-10-18 MED ORDER — DEXAMETHASONE SODIUM PHOSPHATE 10 MG/ML IJ SOLN
INTRAMUSCULAR | Status: AC
  Filled 2021-10-18: qty 1

## 2021-10-18 MED ORDER — BUSPIRONE HCL 5 MG PO TABS
15.0000 mg | ORAL_TABLET | Freq: Three times a day (TID) | ORAL | Status: DC
  Administered 2021-10-18 – 2021-10-20 (×6): 15 mg via ORAL
  Filled 2021-10-18 (×6): qty 3

## 2021-10-18 MED ORDER — DEXAMETHASONE SODIUM PHOSPHATE 10 MG/ML IJ SOLN
INTRAMUSCULAR | Status: DC | PRN
  Administered 2021-10-18: 10 mg via INTRAVENOUS

## 2021-10-18 MED ORDER — SODIUM CHLORIDE (PF) 0.9 % IJ SOLN
INTRAMUSCULAR | Status: AC
  Filled 2021-10-18: qty 50

## 2021-10-18 MED ORDER — QUETIAPINE FUMARATE 50 MG PO TABS: 400.0000 mg | ORAL_TABLET | Freq: Every day | ORAL | Status: DC

## 2021-10-18 MED ORDER — SUGAMMADEX SODIUM 500 MG/5ML IV SOLN
INTRAVENOUS | Status: AC
  Filled 2021-10-18: qty 5

## 2021-10-18 MED ORDER — PROCHLORPERAZINE EDISYLATE 10 MG/2ML IJ SOLN: 10.0000 mg | Freq: Four times a day (QID) | INTRAMUSCULAR | Status: DC | PRN

## 2021-10-18 MED ORDER — KCL IN DEXTROSE-NACL 20-5-0.45 MEQ/L-%-% IV SOLN
INTRAVENOUS | Status: DC
  Filled 2021-10-18 (×4): qty 1000

## 2021-10-18 MED ORDER — BUPIVACAINE LIPOSOME 1.3 % IJ SUSP
INTRAMUSCULAR | Status: AC
  Filled 2021-10-18: qty 20

## 2021-10-18 MED ORDER — SIMETHICONE 80 MG PO CHEW
40.0000 mg | CHEWABLE_TABLET | Freq: Four times a day (QID) | ORAL | Status: DC | PRN
  Administered 2021-10-19 (×2): 40 mg via ORAL
  Filled 2021-10-18 (×2): qty 1

## 2021-10-18 MED ORDER — PANTOPRAZOLE SODIUM 40 MG IV SOLR
40.0000 mg | Freq: Every day | INTRAVENOUS | Status: DC
  Administered 2021-10-18 – 2021-10-19 (×2): 40 mg via INTRAVENOUS
  Filled 2021-10-18 (×2): qty 10

## 2021-10-18 MED ORDER — CEFAZOLIN SODIUM-DEXTROSE 2-4 GM/100ML-% IV SOLN
2.0000 g | INTRAVENOUS | Status: AC
  Administered 2021-10-18: 2 g via INTRAVENOUS
  Filled 2021-10-18: qty 100

## 2021-10-18 MED ORDER — PHENYLEPHRINE 80 MCG/ML (10ML) SYRINGE FOR IV PUSH (FOR BLOOD PRESSURE SUPPORT)
PREFILLED_SYRINGE | INTRAVENOUS | Status: AC
  Filled 2021-10-18: qty 10

## 2021-10-18 MED ORDER — ENOXAPARIN SODIUM 40 MG/0.4ML IJ SOSY
40.0000 mg | PREFILLED_SYRINGE | INTRAMUSCULAR | Status: DC
  Administered 2021-10-19 – 2021-10-20 (×2): 40 mg via SUBCUTANEOUS
  Filled 2021-10-18 (×2): qty 0.4

## 2021-10-18 MED ORDER — HYDROXYZINE HCL 50 MG PO TABS
50.0000 mg | ORAL_TABLET | Freq: Three times a day (TID) | ORAL | Status: DC | PRN
  Administered 2021-10-19 (×2): 50 mg via ORAL
  Filled 2021-10-18 (×4): qty 1

## 2021-10-18 MED ORDER — PROPOFOL 10 MG/ML IV BOLUS
INTRAVENOUS | Status: AC
  Filled 2021-10-18: qty 20

## 2021-10-18 MED ORDER — FENTANYL CITRATE PF 50 MCG/ML IJ SOSY
25.0000 ug | PREFILLED_SYRINGE | INTRAMUSCULAR | Status: DC | PRN
  Administered 2021-10-18 (×3): 50 ug via INTRAVENOUS

## 2021-10-18 MED ORDER — ONDANSETRON HCL 4 MG/2ML IJ SOLN: 4.0000 mg | Freq: Four times a day (QID) | INTRAMUSCULAR | Status: DC | PRN

## 2021-10-18 MED ORDER — KETAMINE HCL 10 MG/ML IJ SOLN
INTRAMUSCULAR | Status: AC
  Filled 2021-10-18: qty 1

## 2021-10-18 MED ORDER — LIDOCAINE 2% (20 MG/ML) 5 ML SYRINGE
INTRAMUSCULAR | Status: DC | PRN
  Administered 2021-10-18: 1.5 mg/kg/h via INTRAVENOUS

## 2021-10-18 MED ORDER — QUETIAPINE FUMARATE 200 MG PO TABS
400.0000 mg | ORAL_TABLET | Freq: Every day | ORAL | Status: DC
  Administered 2021-10-18 – 2021-10-19 (×2): 400 mg via ORAL
  Filled 2021-10-18 (×2): qty 2

## 2021-10-18 MED ORDER — SCOPOLAMINE 1 MG/3DAYS TD PT72
1.0000 | MEDICATED_PATCH | TRANSDERMAL | Status: DC
  Administered 2021-10-18: 1.5 mg via TRANSDERMAL
  Filled 2021-10-18: qty 1

## 2021-10-18 MED ORDER — SUGAMMADEX SODIUM 500 MG/5ML IV SOLN
INTRAVENOUS | Status: DC | PRN
Start: 2021-10-18 — End: 2021-10-18
  Administered 2021-10-18: 500 mg via INTRAVENOUS

## 2021-10-18 MED ORDER — SUCCINYLCHOLINE CHLORIDE 200 MG/10ML IV SOSY
PREFILLED_SYRINGE | INTRAVENOUS | Status: AC
  Filled 2021-10-18: qty 10

## 2021-10-18 MED ORDER — BUPIVACAINE LIPOSOME 1.3 % IJ SUSP: 20.0000 mL | Freq: Once | INTRAMUSCULAR | Status: DC

## 2021-10-18 MED ORDER — ONDANSETRON 4 MG PO TBDP: 4.0000 mg | ORAL_TABLET | Freq: Four times a day (QID) | ORAL | Status: DC | PRN

## 2021-10-18 MED ORDER — ONDANSETRON HCL 4 MG/2ML IJ SOLN
INTRAMUSCULAR | Status: DC | PRN
  Administered 2021-10-18: 4 mg via INTRAVENOUS

## 2021-10-18 MED ORDER — QUETIAPINE FUMARATE 100 MG PO TABS
100.0000 mg | ORAL_TABLET | Freq: Every day | ORAL | Status: DC
  Administered 2021-10-18 – 2021-10-19 (×2): 100 mg via ORAL
  Filled 2021-10-18 (×2): qty 1

## 2021-10-18 MED ORDER — MORPHINE SULFATE (PF) 2 MG/ML IV SOLN
1.0000 mg | INTRAVENOUS | Status: DC | PRN
  Administered 2021-10-18 – 2021-10-20 (×8): 2 mg via INTRAVENOUS
  Filled 2021-10-18 (×8): qty 1

## 2021-10-18 MED ORDER — FENTANYL CITRATE (PF) 100 MCG/2ML IJ SOLN
INTRAMUSCULAR | Status: DC | PRN
  Administered 2021-10-18 (×2): 100 ug via INTRAVENOUS
  Administered 2021-10-18: 50 ug via INTRAVENOUS
  Administered 2021-10-18: 100 ug via INTRAVENOUS
  Administered 2021-10-18 (×2): 50 ug via INTRAVENOUS

## 2021-10-18 MED ORDER — QUETIAPINE FUMARATE 200 MG PO TABS
ORAL_TABLET | Freq: Every day | ORAL | Status: DC
  Filled 2021-10-18: qty 1

## 2021-10-18 MED ORDER — LIDOCAINE HCL (PF) 2 % IJ SOLN
INTRAMUSCULAR | Status: AC
  Filled 2021-10-18: qty 10

## 2021-10-18 MED ORDER — PROPOFOL 10 MG/ML IV BOLUS
INTRAVENOUS | Status: DC | PRN
  Administered 2021-10-18: 50 mg via INTRAVENOUS
  Administered 2021-10-18: 200 mg via INTRAVENOUS

## 2021-10-18 MED ORDER — FENTANYL CITRATE PF 50 MCG/ML IJ SOSY
PREFILLED_SYRINGE | INTRAMUSCULAR | Status: AC
  Filled 2021-10-18: qty 3

## 2021-10-18 MED ORDER — LAMOTRIGINE 100 MG PO TABS
300.0000 mg | ORAL_TABLET | Freq: Every day | ORAL | Status: DC
  Administered 2021-10-19 – 2021-10-20 (×2): 300 mg via ORAL
  Filled 2021-10-18 (×2): qty 3

## 2021-10-18 MED ORDER — LACTATED RINGERS IV SOLN: INTRAVENOUS | Status: DC

## 2021-10-18 MED ORDER — HYDROMORPHONE HCL 2 MG/ML IJ SOLN
INTRAMUSCULAR | Status: AC
  Filled 2021-10-18: qty 1

## 2021-10-18 MED ORDER — PHENYLEPHRINE HCL (PRESSORS) 10 MG/ML IV SOLN
INTRAVENOUS | Status: DC | PRN
  Administered 2021-10-18 (×2): 80 ug via INTRAVENOUS

## 2021-10-18 MED ORDER — LACTATED RINGERS IR SOLN
Status: DC | PRN
  Administered 2021-10-18: 1000 mL

## 2021-10-18 MED ORDER — QUETIAPINE FUMARATE 50 MG PO TABS
100.0000 mg | ORAL_TABLET | Freq: Every day | ORAL | Status: DC
Start: 2021-10-18 — End: 2021-10-18

## 2021-10-18 MED ORDER — ROCURONIUM BROMIDE 10 MG/ML (PF) SYRINGE
PREFILLED_SYRINGE | INTRAVENOUS | Status: AC
  Filled 2021-10-18: qty 20

## 2021-10-18 MED ORDER — ORAL CARE MOUTH RINSE: 15.0000 mL | Freq: Once | OROMUCOSAL | Status: AC

## 2021-10-18 SURGICAL SUPPLY — 69 items
APPLIER CLIP 5 13 M/L LIGAMAX5 (MISCELLANEOUS)
APPLIER CLIP ROT 10 11.4 M/L (STAPLE)
BLADE SURG SZ11 CARB STEEL (BLADE) ×3 IMPLANT
CHLORAPREP W/TINT 26 (MISCELLANEOUS) ×3 IMPLANT
CLIP APPLIE 5 13 M/L LIGAMAX5 (MISCELLANEOUS) IMPLANT
CLIP APPLIE ROT 10 11.4 M/L (STAPLE) IMPLANT
CLSR STERI-STRIP ANTIMIC 1/2X4 (GAUZE/BANDAGES/DRESSINGS) ×3 IMPLANT
COVER SURGICAL LIGHT HANDLE (MISCELLANEOUS) ×3 IMPLANT
COVER TIP SHEARS 8 DVNC (MISCELLANEOUS) ×1 IMPLANT
COVER TIP SHEARS 8MM DA VINCI (MISCELLANEOUS) ×3
DERMABOND ADVANCED (GAUZE/BANDAGES/DRESSINGS) ×1
DERMABOND ADVANCED .7 DNX12 (GAUZE/BANDAGES/DRESSINGS) ×1 IMPLANT
DRAIN PENROSE 0.5X18 (DRAIN) IMPLANT
DRAPE ARM DVNC X/XI (DISPOSABLE) ×8 IMPLANT
DRAPE COLUMN DVNC XI (DISPOSABLE) ×2 IMPLANT
DRAPE DA VINCI XI ARM (DISPOSABLE) ×12
DRAPE DA VINCI XI COLUMN (DISPOSABLE) ×3
DRSG TEGADERM 2-3/8X2-3/4 SM (GAUZE/BANDAGES/DRESSINGS) ×18 IMPLANT
ELECT REM PT RETURN 15FT ADLT (MISCELLANEOUS) ×3 IMPLANT
GAUZE 4X4 16PLY ~~LOC~~+RFID DBL (SPONGE) ×3 IMPLANT
GAUZE SPONGE 2X2 8PLY STRL LF (GAUZE/BANDAGES/DRESSINGS) ×2 IMPLANT
GLOVE BIO SURGEON STRL SZ 6 (GLOVE) ×9 IMPLANT
GLOVE BIOGEL PI IND STRL 8 (GLOVE) ×2 IMPLANT
GLOVE BIOGEL PI INDICATOR 8 (GLOVE) ×1
GLOVE INDICATOR 6.5 STRL GRN (GLOVE) ×9 IMPLANT
GLOVE SURG LX 8.0 MICRO (GLOVE) ×3
GLOVE SURG LX STRL 8.0 MICRO (GLOVE) ×6 IMPLANT
GOWN STRL REUS W/ TWL XL LVL3 (GOWN DISPOSABLE) IMPLANT
GOWN STRL REUS W/TWL XL LVL3 (GOWN DISPOSABLE)
GRASPER SUT TROCAR 14GX15 (MISCELLANEOUS) ×2 IMPLANT
IRRIG SUCT STRYKERFLOW 2 WTIP (MISCELLANEOUS) ×3
IRRIGATION SUCT STRKRFLW 2 WTP (MISCELLANEOUS) ×2 IMPLANT
KIT BASIN OR (CUSTOM PROCEDURE TRAY) ×3 IMPLANT
KIT TURNOVER KIT A (KITS) IMPLANT
LUBRICANT JELLY K Y 4OZ (MISCELLANEOUS) IMPLANT
MARKER SKIN DUAL TIP RULER LAB (MISCELLANEOUS) IMPLANT
MESH BIO-A 7X10 SYN MAT (Mesh General) ×2 IMPLANT
NEEDLE HYPO 22GX1.5 SAFETY (NEEDLE) ×3 IMPLANT
PACK CARDIOVASCULAR III (CUSTOM PROCEDURE TRAY) ×3 IMPLANT
PAD POSITIONING PINK XL (MISCELLANEOUS) ×3 IMPLANT
SCISSORS LAP 5X35 DISP (ENDOMECHANICALS) IMPLANT
SEAL CANN UNIV 5-8 DVNC XI (MISCELLANEOUS) ×8 IMPLANT
SEAL XI 5MM-8MM UNIVERSAL (MISCELLANEOUS) ×12
SEALER VESSEL DA VINCI XI (MISCELLANEOUS) ×3
SEALER VESSEL EXT DVNC XI (MISCELLANEOUS) ×2 IMPLANT
SLEEVE ENDOPATH XCEL 5M (ENDOMECHANICALS) ×2 IMPLANT
SOL ANTI FOG 6CC (MISCELLANEOUS) ×2 IMPLANT
SOLUTION ANTI FOG 6CC (MISCELLANEOUS) ×1
SOLUTION ELECTROLUBE (MISCELLANEOUS) ×3 IMPLANT
SPIKE FLUID TRANSFER (MISCELLANEOUS) ×3 IMPLANT
SPONGE GAUZE 2X2 STER 10/PKG (GAUZE/BANDAGES/DRESSINGS) ×1
SUT ETHIBOND 0 36 GRN (SUTURE) ×9 IMPLANT
SUT MNCRL AB 4-0 PS2 18 (SUTURE) ×3 IMPLANT
SUT SILK 0 SH 30 (SUTURE) IMPLANT
SUT SILK 2 0 SH (SUTURE) ×8 IMPLANT
SUT VIC AB 0 UR5 27 (SUTURE) IMPLANT
SYR 10ML ECCENTRIC (SYRINGE) ×3 IMPLANT
SYR 20ML LL LF (SYRINGE) ×3 IMPLANT
TIP INNERVISION DETACH 40FR (MISCELLANEOUS) IMPLANT
TIP INNERVISION DETACH 50FR (MISCELLANEOUS) IMPLANT
TIP INNERVISION DETACH 56FR (MISCELLANEOUS) IMPLANT
TIPS INNERVISION DETACH 40FR (MISCELLANEOUS)
TOWEL OR 17X26 10 PK STRL BLUE (TOWEL DISPOSABLE) ×3 IMPLANT
TRAY FOLEY MTR SLVR 16FR STAT (SET/KITS/TRAYS/PACK) IMPLANT
TROCAR ADV FIXATION 12X100MM (TROCAR) IMPLANT
TROCAR ADV FIXATION 5X100MM (TROCAR) IMPLANT
TROCAR BLADELESS OPT 5 100 (ENDOMECHANICALS) ×3 IMPLANT
TUBE GASTROSTOMY 18F (CATHETERS) ×2 IMPLANT
TUBING INSUFFLATION 10FT LAP (TUBING) ×3 IMPLANT

## 2021-10-18 NOTE — Anesthesia Postprocedure Evaluation (Signed)
Anesthesia Post Note ? ?Patient: ANTONETTE HENDRICKS ? ?Procedure(s) Performed: XI ROBOTIC ASSISTED HIATAL HERNIA REPAIR WITH MESH UPPER ENDOSCOPY; ROBOTIC ASSISTED GASTROSTOMY TUBE PLACEMENT (Abdomen) ? ?  ? ?Patient location during evaluation: PACU ?Anesthesia Type: General ?Level of consciousness: patient cooperative, sedated and oriented ?Pain management: pain level controlled (pain improving) ?Vital Signs Assessment: post-procedure vital signs reviewed and stable ?Respiratory status: spontaneous breathing, nonlabored ventilation, respiratory function stable and patient connected to nasal cannula oxygen ?Cardiovascular status: blood pressure returned to baseline and stable ?Postop Assessment: no apparent nausea or vomiting ?Anesthetic complications: no ? ? ?No notable events documented. ? ?Last Vitals:  ?Vitals:  ? 10/18/21 1455 10/18/21 1500  ?BP:  (!) 155/88  ?Pulse: (!) 108 85  ?Resp: (!) 24 13  ?Temp:    ?SpO2: 100% 98%  ?  ?Last Pain:  ?Vitals:  ? 10/18/21 1500  ?TempSrc:   ?PainSc: Asleep  ? ? ?  ?  ?  ?  ?  ?  ? ?Evanne Matsunaga,E. Asahel Risden ? ? ? ? ?

## 2021-10-18 NOTE — Transfer of Care (Signed)
Immediate Anesthesia Transfer of Care Note ? ?Patient: [name] [name] ? ?Procedure(s) Performed: XI ROBOTIC ASSISTED HIATAL HERNIA REPAIR WITH MESH UPPER ENDOSCOPY; ROBOTIC ASSISTED GASTROSTOMY TUBE PLACEMENT (Abdomen) ? ?Patient Location: PACU ? ?Anesthesia Type:General ? ?Level of Consciousness: awake, alert , oriented and patient cooperative ? ?Airway & Oxygen Therapy: Patient Spontanous Breathing and Patient connected to face mask oxygen ? ?Post-op Assessment: Report given to RN, Post -op Vital signs reviewed and stable and Patient moving all extremities X 4 ? ?Post vital signs: Reviewed and stable ? ?Last Vitals:  ?Vitals Value Taken Time  ?BP 142/91 10/18/21 1411  ?Temp 37.2 ?C 10/18/21 1411  ?Pulse 108 10/18/21 1414  ?Resp 26 10/18/21 1414  ?SpO2 98 % 10/18/21 1414  ?Vitals shown include unvalidated device data. ? ?Last Pain:  ?Vitals:  ? 10/18/21 1412  ?TempSrc:   ?PainSc: 9   ?   ? ?Patients Stated Pain Goal: 4 (10/18/21 0757) ? ?Complications: No notable events documented. ?

## 2021-10-18 NOTE — Anesthesia Preprocedure Evaluation (Signed)
Anesthesia Evaluation  ?Patient identified by MRN, date of birth, ID band ?Patient awake ? ? ? ?Reviewed: ?Allergy & Precautions, NPO status , Patient's Chart, lab work & pertinent test results ? ?History of Anesthesia Complications ?(+) PONV and history of anesthetic complications ? ?Airway ?Mallampati: II ? ?TM Distance: >3 FB ?Neck ROM: Full ? ? ? Dental ? ?(+) Dental Advisory Given ?  ?Pulmonary ?neg pulmonary ROS,  ?  ?breath sounds clear to auscultation ? ? ? ? ? ? Cardiovascular ?hypertension, Pt. on medications ? ?Rhythm:Regular Rate:Normal ? ? ?  ?Neuro/Psych ? Headaches,   ? GI/Hepatic ?Neg liver ROS, GERD  ,  ?Endo/Other  ?Hypothyroidism Morbid obesity ? Renal/GU ?Renal disease  ? ?  ?Musculoskeletal ? ?(+) Arthritis , Fibromyalgia - ? Abdominal ?  ?Peds ? Hematology ?negative hematology ROS ?(+)   ?Anesthesia Other Findings ? ? Reproductive/Obstetrics ? ?  ? ? ? ? ? ? ? ? ? ? ? ? ? ?  ?  ? ? ? ? ? ? ? ? ?Lab Results  ?Component Value Date  ? WBC 7.4 10/05/2021  ? HGB 13.3 10/05/2021  ? HCT 41.0 10/05/2021  ? MCV 83.8 10/05/2021  ? PLT 322 10/05/2021  ? ?Lab Results  ?Component Value Date  ? CREATININE 0.72 10/05/2021  ? BUN 13 10/05/2021  ? NA 140 10/05/2021  ? K 3.8 10/05/2021  ? CL 104 10/05/2021  ? CO2 27 10/05/2021  ? ? ?Anesthesia Physical ?Anesthesia Plan ? ?ASA: 3 ? ?Anesthesia Plan: General  ? ?Post-op Pain Management: Tylenol PO (pre-op)* and Ketamine IV*  ? ?Induction: Intravenous ? ?PONV Risk Score and Plan: 4 or greater and Midazolam, Scopolamine patch - Pre-op, Propofol infusion, Ondansetron, Dexamethasone and Treatment may vary due to age or medical condition ? ?Airway Management Planned: Oral ETT ? ?Additional Equipment: None ? ?Intra-op Plan:  ? ?Post-operative Plan: Extubation in OR ? ?Informed Consent: I have reviewed the patients History and Physical, chart, labs and discussed the procedure including the risks, benefits and alternatives for the  proposed anesthesia with the patient or authorized representative who has indicated his/her understanding and acceptance.  ? ? ? ?Dental advisory given ? ?Plan Discussed with: CRNA ? ?Anesthesia Plan Comments:   ? ? ? ? ? ? ?Anesthesia Quick Evaluation ? ?

## 2021-10-18 NOTE — Brief Op Note (Signed)
10/18/2021 ? ?2:01 PM ? ?PATIENT:  [name] [name]  36 y.o. female ? ?PRE-OPERATIVE DIAGNOSIS:  sliding hiatal hernia; h/o laparoscopic roux en y gastric bypass 2014 ? ?POST-OPERATIVE DIAGNOSIS:  same ? ?PROCEDURE:  Procedure(s): ?XI ROBOTIC ASSISTED HIATAL HERNIA REPAIR WITH MESH UPPER ENDOSCOPY; ROBOTIC ASSISTED GASTROSTOMY TUBE PLACEMENT (N/A) ?LAPAROSCOPIC BILATERAL TAP BLOCK ? ?SURGEON:  Surgeon(s) and Role: ?   Gaynelle Adu, MD - Primary ?   * ? ?PHYSICIAN ASSISTANT:  ? ?ASSISTANTS:  Kinsinger, De Blanch, MD  ? ?ANESTHESIA:   general ? ?EBL:  50 mL  ? ?BLOOD ADMINISTERED:none ? ?DRAINS: Gastrostomy Tube 18 Fr ? ?LOCAL MEDICATIONS USED:  OTHER exparel ? ?SPECIMEN:  No Specimen ? ?DISPOSITION OF SPECIMEN:  N/A ? ?COUNTS:  YES ? ?TOURNIQUET:  * No tourniquets in log * ? ?DICTATION: .[id number] ? ?PLAN OF CARE: Admit to inpatient  ? ?PATIENT DISPOSITION:  PACU - hemodynamically stable. ?  ?Delay start of Pharmacological VTE agent (>24hrs) due to surgical blood loss or risk of bleeding: no ? ?

## 2021-10-18 NOTE — H&P (Signed)
?CC: Epigastric pain, intermittent nausea and vomiting ? ?Requesting provider:  ? ?HPI: ?[name] [name] is an 36 y.o. female who is here for here for robotic repair of hiatal hernia repair, upper endoscopy and possible gastrotomy tube insertion.  sHe has a remote history of laparoscopic Roux-en-Y gastric bypass in 2014.  She has been doing well and had an uneventful long-term recovery however the past year or 2 she has been having intermittent abdominal issues.  She was taken back to the OR in 2021 and had a portion of her Roux limb resected.-The candycane limb AKA the blind end of the Roux limb.  Unfortunately she is continued to have intermittent epigastric pain associated with nausea and vomiting.  She also complains of severe reflux.  sHe has lost about 16 kg since December due to inability and difficulty with eating.  Most recently she was diagnosed with strep pharyngitis on April 30.  She received a course of antibiotics.  She denies any current symptoms.  She denies any fever, chills, trouble swallowing shortness of breath, sinus drainage. ? 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 ?Mild inflammation of her gastric remnant pouch, biospied to check for H. pylori. ?2-3cm hiatal hernia causing typical foreshortened and tortuous esophagus. ? ?sHe had an upper GI in December 2022 ?Postsurgical changes of Roux-en-Y gastric bypass. Moderate size ?hiatal hernia. Spontaneous gastroesophageal reflux occurred during ?the exam to the upper esophagus. ?  ?No evidence of obstruction. Normal emptying of the small gastric ?pouch through the gastrojejunostomy. Normal appearance of the ?proximal jejunum. ? ?The only abnormality that could be found was a small sliding hiatal hernia. ? ?She has had multiple ER visits over the past 8 months regarding these complaints. ? ?Past Medical History:  ?Diagnosis Date  ? ADD  (attention deficit disorder)   ? Anemia   ? Anxiety   ? Arthritis   ? B12 deficiency   ? Back pain   ? Bilateral swelling of feet   ? Bipolar 2 disorder (HCC) 09/19/2019  ? Constipation   ? DEPRESSION   ? Elevated cholesterol   ? Family history of colon cancer 05/31/19  ? Father, 93s; deceased.   ? Fatty liver   ? Fibromyalgia   ? Gallbladder problem   ? GERD (gastroesophageal reflux disease)   ? Heartburn   ? History of kidney stones   ? History of stomach ulcers   ? Hypertension   ? Hypothyroidism   ? Hx of, normalized TSH during pregnancy 2011  ? Infertility, female   ? Lactose intolerance   ? Migraines   ? Morbid obesity (HCC)   ? s/p RY 08/2012 - start weight 290 pounds  ? Palpitations   ? Panic disorder   ? PCOS (polycystic ovarian syndrome)   ? Pneumonia   ? PONV (postoperative nausea and vomiting)   ? Pregnancy induced hypertension   ? Shortness of breath   ? Vitamin D deficiency   ? ? ?Past Surgical History:  ?Procedure Laterality Date  ? AIKEN OSTEOTOMY Left 06/24/2017  ? Procedure: Ralene Bathe;  Surgeon: [name], DPM;  Location: [facility];  Service: Podiatry;  Laterality: Left;  ? BIOPSY  07/04/2021  ? Procedure: BIOPSY;  Surgeon: Rachael Fee, MD;  Location: [facility];  Service: Endoscopy;;  ? BUNIONECTOMY Left 06/24/2017  ? Procedure: Anthoney Harada;  Surgeon: [name], DPM;  Location: [facility];  Service: Podiatry;  Laterality: Left;  ? CESAREAN SECTION    ? x 2  ? CHOLECYSTECTOMY N/A 02/15/2019  ? Procedure: LAPAROSCOPIC CHOLECYSTECTOMY WITH INTRAOPERATIVE CHOLANGIOGRAM;  Surgeon: Andria Meuse, MD;  Location: [facility];  Service: General;  Laterality: N/A;  ? COLON RESECTION N/A 05/04/2020  ? Procedure: DIAGNOSTIC LAPAROSCOPY, RESECTION OF CANDYCANE, UPPER ENDOSCOPY;  Surgeon: Berna Bue, MD;  Location: [facility];  Service: General;  Laterality: N/A;  ? ESOPHAGOGASTRODUODENOSCOPY (EGD) WITH PROPOFOL N/A 07/04/2021  ? Procedure:  ESOPHAGOGASTRODUODENOSCOPY (EGD) WITH PROPOFOL;  Surgeon: Rachael Fee, MD;  Location: [facility];  Service: Endoscopy;  Laterality: N/A;  ? FOOT SURGERY    ? 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  ? Procedure: LAPAROSCOPIC ROUX-EN-Y GASTRIC;  Surgeon: Atilano Ina, MD;  Location: [facility];  Service: General;  Laterality: N/A;  laparoscopic roux-en-y gastric bypass  ? LITHOTRIPSY Left   ? TONSILLECTOMY    ? TUBAL LIGATION    ? UPPER GI ENDOSCOPY  08/24/2012  ? Procedure: UPPER GI ENDOSCOPY;  Surgeon: Atilano Ina, MD;  Location: [facility];  Service: General;;  ? WISDOM TOOTH EXTRACTION    ? ? ?Family History  ?Problem Relation Age of Onset  ? Hyperlipidemia Father   ? Hypertension Father   ? Kidney disease Father   ? Colon cancer Father 71  ? Cancer Father   ? Depression Father   ? Anxiety disorder Father   ? Bipolar disorder Father   ? Sleep apnea Father   ? Obesity Father   ? Hypertension Mother   ? Depression Mother   ? Anxiety disorder Mother   ? Obesity Mother   ? Hypertension Maternal Grandmother   ? Uterine cancer Maternal Grandmother 63  ? Diabetes Maternal Grandfather   ? ? ?Social:  reports that she has never smoked. She has never used smokeless tobacco. She reports that she does not currently use alcohol. She reports that she does not use drugs. ? ?Allergies:  ?Allergies  ?Allergen Reactions  ? Nsaids Other (See Comments)  ?  Gastric bypass ?"  ? ? ?Medications: I have reviewed the patient's current medications. ? ? ?ROS - all of the below systems have been reviewed with the patient and positives are indicated with bold text ?General: chills, fever or night sweats ?Eyes: blurry vision or double vision ?ENT: epistaxis or sore throat ?Allergy/Immunology: itchy/watery eyes or nasal congestion ?Hematologic/Lymphatic: bleeding problems, blood clots or swollen lymph nodes ?Endocrine: temperature intolerance or unexpected weight changes ?Breast: new or changing breast lumps or nipple discharge ?Resp: cough,  shortness of breath, or wheezing ?CV: chest pain or dyspnea on exertion ?GI: as per HPI ?GU: dysuria, trouble voiding, or hematuria ?MSK: joint pain or joint stiffness ?Neuro: TIA or stroke symptoms ?Derm: pruritus and skin lesion changes ?Psych: anxiety and depression ? ?PE ?There were no vitals taken for this visit. ?Constitutional: NAD; conversant; no deformities ?Eyes: Moist conjunctiva; no lid lag; anicteric; PERRL ?Neck: Trachea midline; no thyromegaly ?Lungs: Normal respiratory effort; no tactile fremitus ?CV: RRR; no palpable thrills; no pitting edema ?GI: Abd old trocar scars, nontender, nd; no palpable hepatosplenomegaly ?MSK: Normal gait; no clubbing/cyanosis ?Psychiatric: Appropriate affect; alert and oriented x3 ?Lymphatic: No palpable cervical or axillary lymphadenopathy ?Skin:no rash/lesions/jaundice ? ?No results found. However, due to the size of the patient record, not all encounters were searched. Please check Results Review for a complete set of results. ? ?No results found. ? ?Imaging: ?Personally reviewwed ? ?A/P: ?Lorenzo Pereyra  [name] [name] is an 36 y.o. female with  ?Epigastric pain, intermittent nausea vomiting ?Sliding hiatal hernia ?Remote history of laparoscopic Roux-en-Y gastric bypass ?Reflux ? ?To operating room for robotic repair of hiatal hernia, possible mesh, upper endoscopy possible placement of gastrotomy tube in gastric remnant ? ?Discussed pros and cons of gastrotomy tube ?We rediscussed the typical hospitalization ?We rediscussed the diet progression after surgery ? ?I think it safe to proceed she is currently asymptomatic from her pharyngitis ? ? ? ?Mary SellaEric M. Andrey CampanileWilson, MD, FACS ?General, Bariatric, & Minimally Invasive Surgery ?[facility] ? ? ?

## 2021-10-18 NOTE — Anesthesia Procedure Notes (Signed)
Procedure Name: Intubation ?Date/Time: 10/18/2021 10:29 AM ?Performed by: Jonna Munro, CRNA ?Pre-anesthesia Checklist: Patient identified, Emergency Drugs available, Suction available, Patient being monitored and Timeout performed ?Patient Re-evaluated:Patient Re-evaluated prior to induction ?Oxygen Delivery Method: Circle system utilized ?Preoxygenation: Pre-oxygenation with 100% oxygen ?Induction Type: IV induction ?Laryngoscope Size: Mac and 3 ?Grade View: Grade I ?Tube type: Oral ?Tube size: 7.5 mm ?Number of attempts: 1 ?Airway Equipment and Method: Stylet ?Placement Confirmation: ETT inserted through vocal cords under direct vision, positive ETCO2 and breath sounds checked- equal and bilateral ?Secured at: 22 cm ?Tube secured with: Tape ?Dental Injury: Teeth and Oropharynx as per pre-operative assessment  ? ? ? ? ?

## 2021-10-19 ENCOUNTER — Inpatient Hospital Stay (HOSPITAL_COMMUNITY): Payer: 59

## 2021-10-19 DIAGNOSIS — K449 Diaphragmatic hernia without obstruction or gangrene: Secondary | ICD-10-CM | POA: Diagnosis not present

## 2021-10-19 LAB — BASIC METABOLIC PANEL
Anion gap: 6 (ref 5–15)
BUN: <5 mg/dL — ABNORMAL LOW (ref 6–20)
CO2: 27 mmol/L (ref 22–32)
Calcium: 8.7 mg/dL — ABNORMAL LOW (ref 8.9–10.3)
Chloride: 108 mmol/L (ref 98–111)
Creatinine, Ser: 0.6 mg/dL (ref 0.44–1.00)
GFR, Estimated: >60 mL/min (ref >60)
Glucose, Bld: 114 mg/dL — ABNORMAL HIGH (ref 70–99)
Potassium: 4 mmol/L (ref 3.5–5.1)
Sodium: 141 mmol/L (ref 135–145)

## 2021-10-19 LAB — CBC
HCT: 34.3 % — ABNORMAL LOW (ref 36.0–46.0)
Hemoglobin: 11.2 g/dL — ABNORMAL LOW (ref 12.0–15.0)
MCH: 28.2 pg (ref 26.0–34.0)
MCHC: 32.7 g/dL (ref 30.0–36.0)
MCV: 86.4 fL (ref 80.0–100.0)
Platelets: 333 10*3/uL (ref 150–400)
RBC: 3.97 MIL/uL (ref 3.87–5.11)
RDW: 14.2 % (ref 11.5–15.5)
WBC: 10.1 10*3/uL (ref 4.0–10.5)
nRBC: 0 % (ref 0.0–0.2)

## 2021-10-19 LAB — MAGNESIUM: Magnesium: 2 mg/dL (ref 1.7–2.4)

## 2021-10-19 IMAGING — RF DG UGI W SINGLE CM
14 of 19 series · 14 of 24 positions shown · non-contrast
Comparison: CT [DATE]

CLINICAL DATA: One day status post hernia repair previous Roux-en-Y
surgery. Assess for leak.

EXAM:
DG UGI W SINGLE CM
TECHNIQUE: Single contrast examination was then performed using thin liquid
barium. This exam was performed by SAPARILLA, and was supervised
and interpreted by SAPARILLA.
FLUOROSCOPY:
Radiation Exposure Index (as provided by the fluoroscopic device): 3
minutes 30 seconds 7612.5 micro gray meter squared

[Series 1: t abdomen · 1 of 1 slices shown]
[im 1/1]
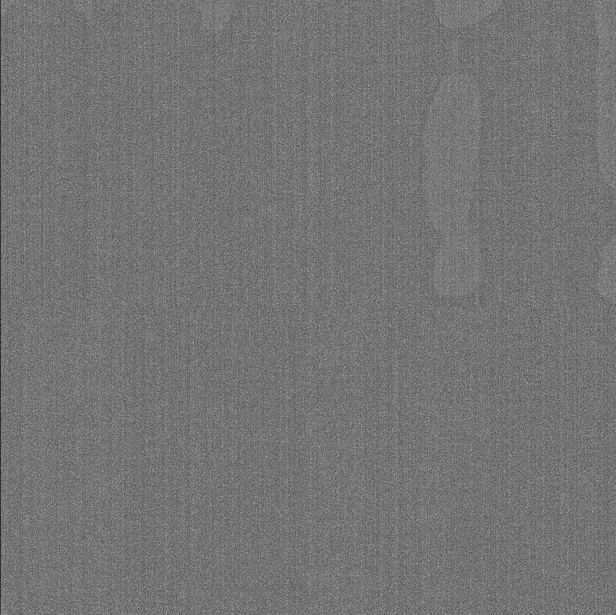

[Series 3: cp_standard · 1 of 171 frames shown (1 of 7)]
[frame 26/171]
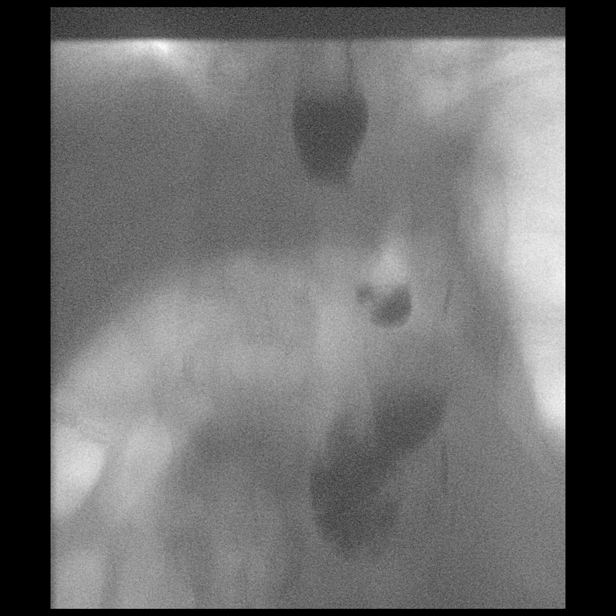

[Series 6: cp_standard · 1 of 170 frames shown (2 of 7)]
[frame 86/170]
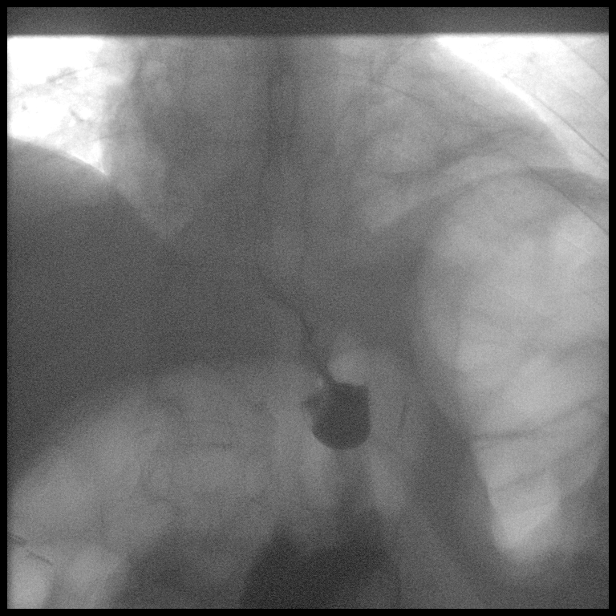

[Series 7: fluoro_barium 2fps_bw · 1 of 11 frames shown (1 of 6)]
[frame 6/11]
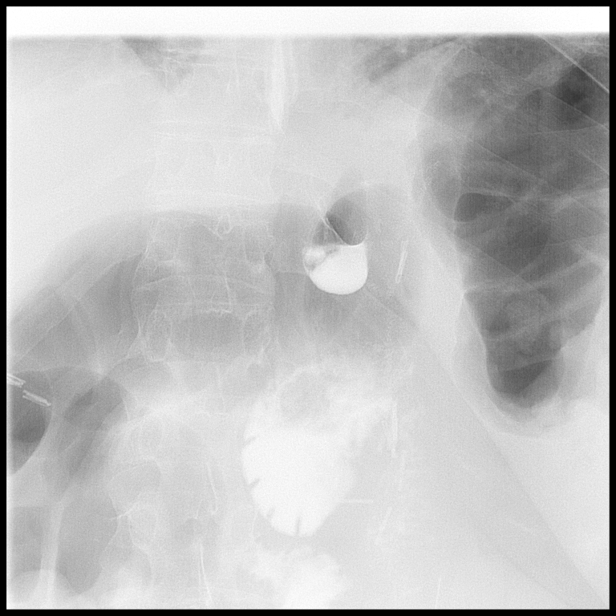

[Series 8: cp_standard · 1 of 225 frames shown (3 of 7)]
[frame 34/225]
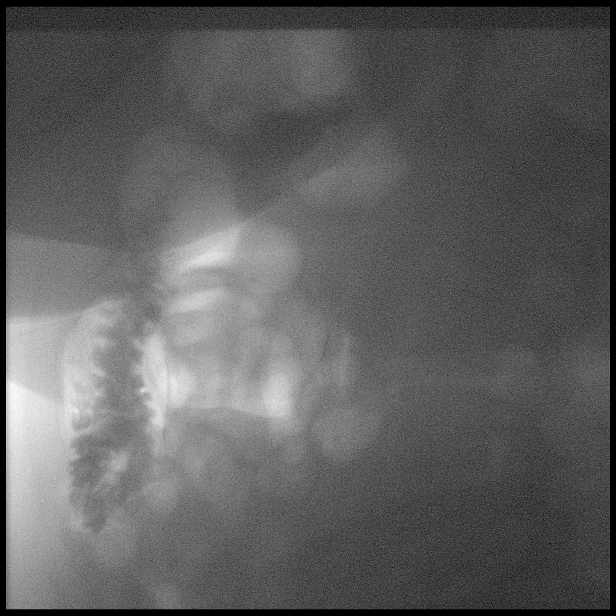

[Series 9: cp_standard · 1 of 221 frames shown (4 of 7)]
[frame 218/221]
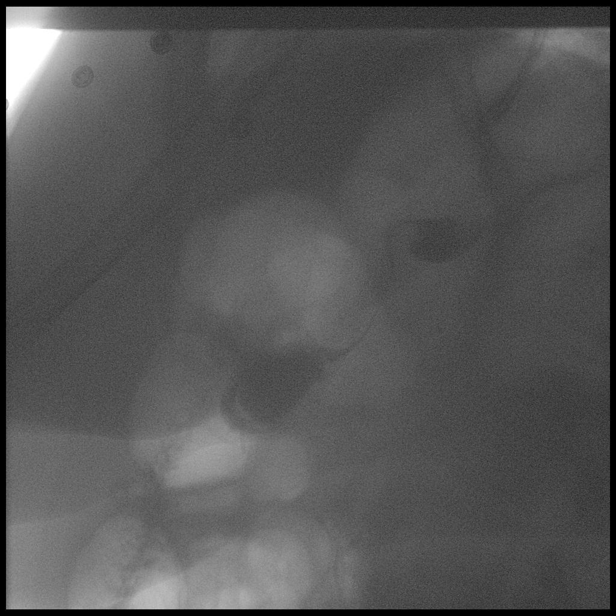

[Series 12: fluoro_barium 2fps_bw · 1 of 10 frames shown (2 of 6)]
[frame 9/10]
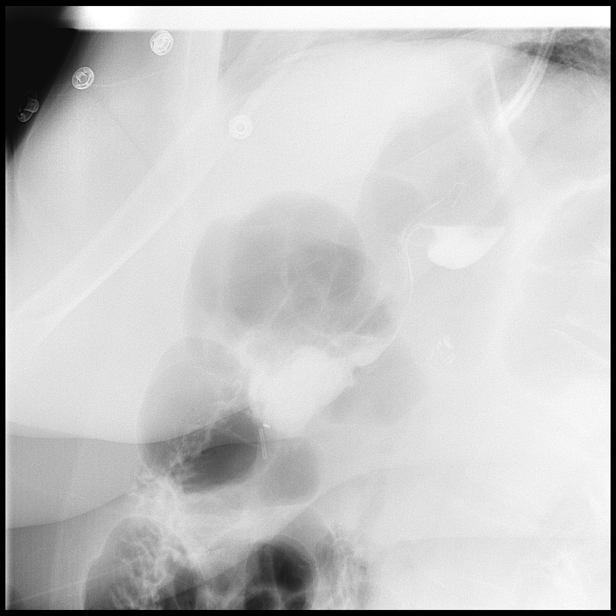

[Series 13: fluoro_barium 2fps_bw · 1 of 12 frames shown (3 of 6)]
[frame 9/12]
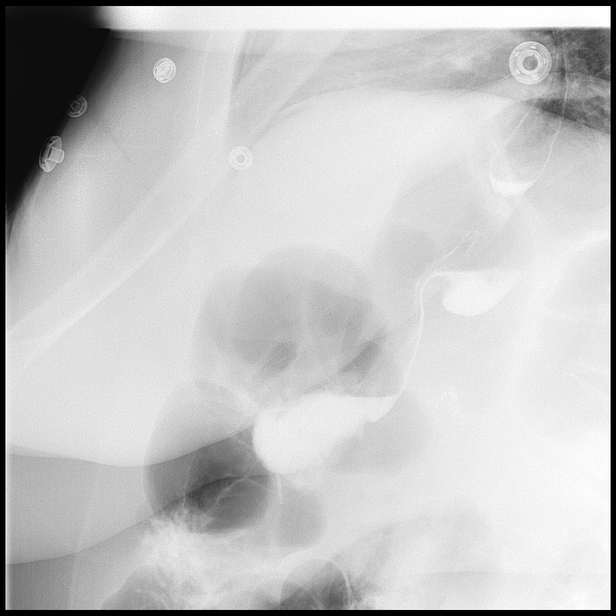

[Series 15: cp_standard · 1 of 220 frames shown (5 of 7)]
[frame 34/220]
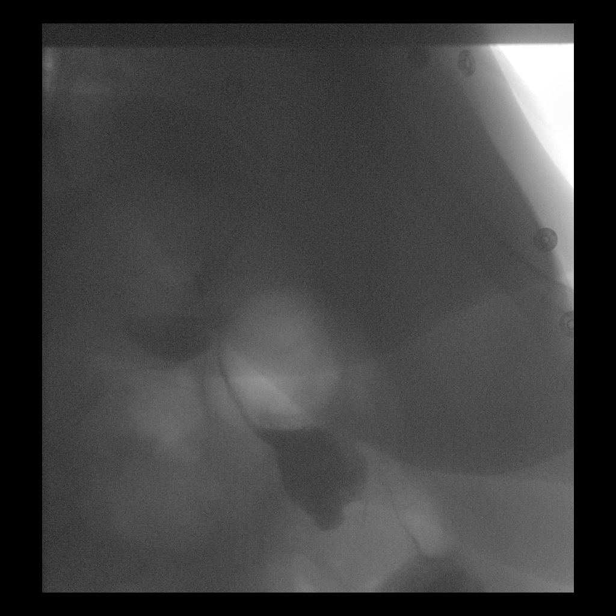

[Series 16: cp_standard · 1 of 169 frames shown (6 of 7)]
[frame 132/169]
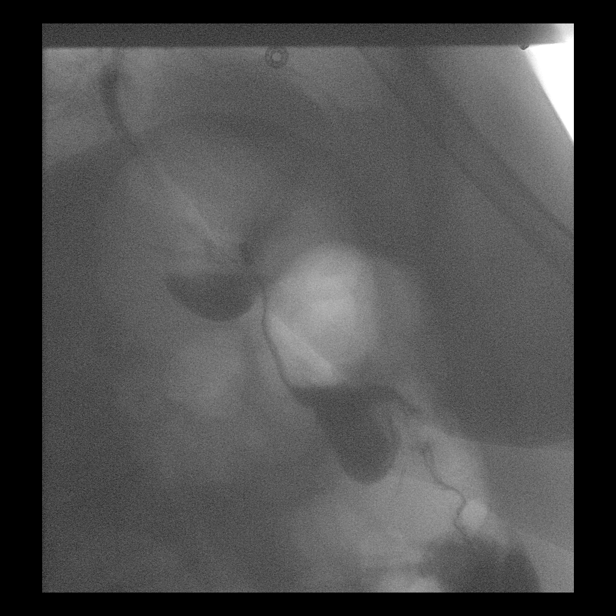

[Series 17: fluoro_barium 2fps_bw · 1 of 16 frames shown (4 of 6)]
[frame 14/16]
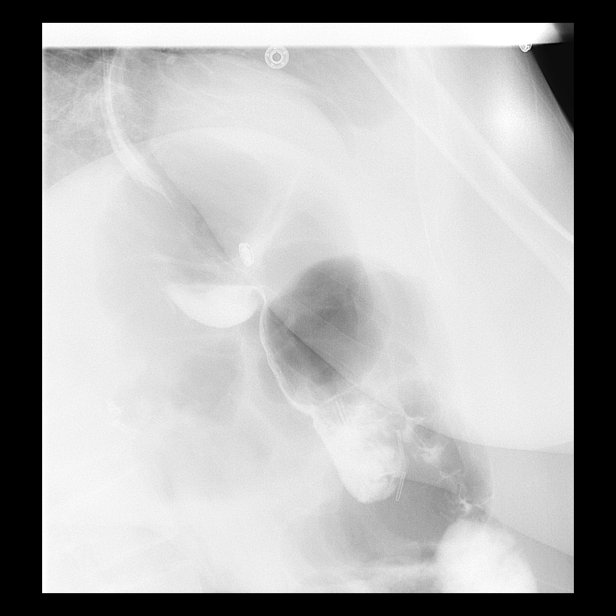

[Series 18: cp_standard · 1 of 167 frames shown (7 of 7)]
[frame 84/167]
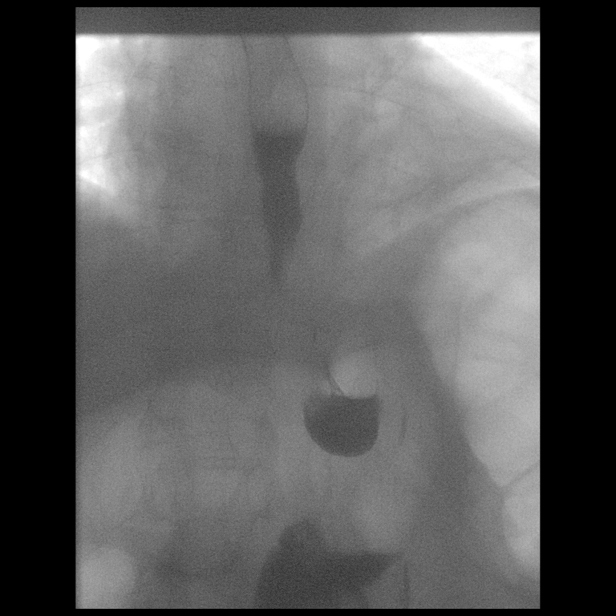

[Series 20: fluoro_barium 2fps_bw · 1 of 11 frames shown (5 of 6)]
[frame 2/11]
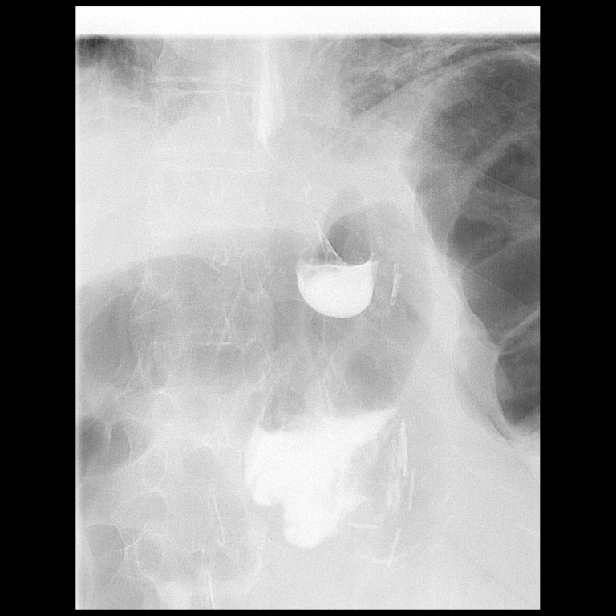

[Series 21: fluoro_barium 2fps_bw · 1 of 11 frames shown (6 of 6)]
[frame 10/11]
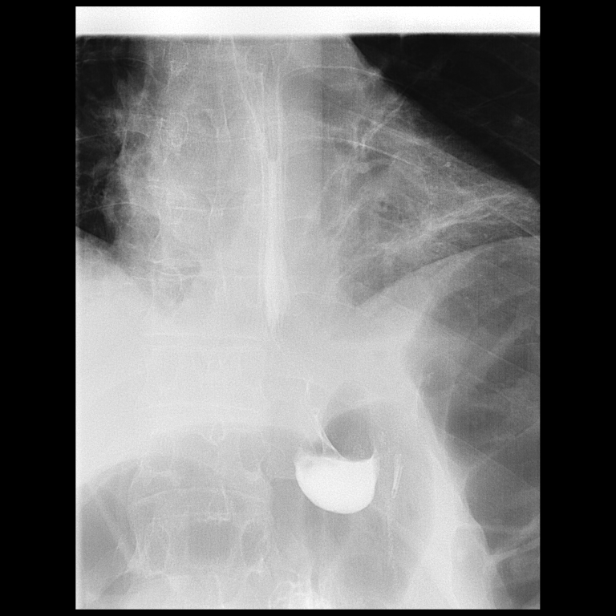

[14 of 24 positions shown; findings below may reference images not displayed]

FINDINGS: Esophagus:  Normal

Esophageal motility:  Normal

Gastroesophageal reflux:  None visualized.

Ingested 13mm barium tablet: Not given

Stomach: Hernia repair has a good appearance. No evidence contrast
leak or obstruction.

Gastric emptying: Normal.  Roux-en-Y gastric bypass.
IMPRESSION: Good appearance following hernia repair. No evidence of obstruction
or leak.

Previous Roux-en-Y gastric bypass without complicating feature.

## 2021-10-19 MED ORDER — IOHEXOL 300 MG/ML  SOLN
100.0000 mL | Freq: Once | INTRAMUSCULAR | Status: AC | PRN
Start: 2021-10-19 — End: 2021-10-19
  Administered 2021-10-19: 80 mL via ORAL

## 2021-10-19 NOTE — Progress Notes (Signed)
? ? ?Assessment & Plan: ?POD#1 - status post Xi robotic-assisted hiatal hernia repair with mesh, gastrostomy tube placement - Dr. Andrey Campanile 10-18-2021 ? - tolerating clear liquid diet ? - UGI series this AM without leak ? - advance to full liquid diet today ? ?Patient with some pain at G-tube site.  Will advance diet.  Encouraged OOB, ambulation.  Anticipate discharge home tomorrow. ? ?      [name] Level, MD ?[facility] ?A [facility] practice ?Office: [phone] ?       ?Chief Complaint: ?Hiatal hernia ? ?Subjective: ?Patient in bed, pain improved.  Family at bedside.  Tolerating clear liquids. ? ?Objective: ?Vital signs in last 24 hours: ?Temp:  [97.6 ?F (36.4 ?C)-98.9 ?F (37.2 ?C)] 98.3 ?F (36.8 ?C) (05/06 0932) ?Pulse Rate:  [72-112] 92 (05/06 0932) ?Resp:  [13-26] 16 (05/06 0932) ?BP: (110-155)/(73-118) 154/118 (05/06 0932) ?SpO2:  [95 %-100 %] 97 % (05/06 0932) ?Last BM Date : 10/17/21 ? ?Intake/Output from previous day: ?05/05 0701 - 05/06 0700 ?In: 4551.4 [P.O.:1110; I.V.:3191.4; IV Piggyback:250] ?Out: 2700 [Urine:2650; Blood:50] ?Intake/Output this shift: ?Total I/O ?In: 360 [P.O.:360] ?Out: 400 [Urine:400] ? ?Physical Exam: ?HEENT - sclerae clear, mucous membranes moist ?Abdomen - soft, gastrotomy tube in place with bilious in tubing; wounds dry and intact ?Ext - no edema, non-tender ?Neuro - alert & oriented, no focal deficits ? ?Lab Results:  ?Recent Labs  ?  10/19/21 ?0432  ?WBC 10.1  ?HGB 11.2*  ?HCT 34.3*  ?PLT 333  ? ?BMET ?Recent Labs  ?  10/19/21 ?0432  ?NA 141  ?K 4.0  ?CL 108  ?CO2 27  ?GLUCOSE 114*  ?BUN <5*  ?CREATININE 0.60  ?CALCIUM 8.7*  ? ?PT/INR ?No results for input(s): LABPROT, INR in the last 72 hours. ?Comprehensive Metabolic Panel: ?   ?Component Value Date/Time  ? NA 141 10/19/2021 0432  ? NA 140 10/05/2021 1803  ? NA 141 02/26/2021 0000  ? NA 139 01/17/2020 0000  ? NA 143 08/16/2015 0850  ? NA 143 04/19/2015 0847  ? K 4.0 10/19/2021 0432  ? K 3.8 10/05/2021 1803  ? K 3.6  08/16/2015 0850  ? K 3.8 04/19/2015 0847  ? CL 108 10/19/2021 0432  ? CL 104 10/05/2021 1803  ? CO2 27 10/19/2021 0432  ? CO2 27 10/05/2021 1803  ? CO2 29 08/16/2015 0850  ? CO2 27 04/19/2015 0847  ? BUN <5 (L) 10/19/2021 0432  ? BUN 13 10/05/2021 1803  ? BUN 14 02/26/2021 0000  ? BUN 14 01/17/2020 0000  ? BUN 13.0 08/16/2015 0850  ? BUN 13.4 04/19/2015 0847  ? CREATININE 0.60 10/19/2021 0432  ? CREATININE 0.72 10/05/2021 1803  ? CREATININE 0.62 03/11/2019 1557  ? CREATININE 0.7 08/16/2015 0850  ? CREATININE 0.7 04/19/2015 0847  ? GLUCOSE 114 (H) 10/19/2021 0432  ? GLUCOSE 88 10/05/2021 1803  ? GLUCOSE 78 08/16/2015 0850  ? GLUCOSE 75 04/19/2015 0847  ? CALCIUM 8.7 (L) 10/19/2021 0432  ? CALCIUM 9.5 10/05/2021 1803  ? CALCIUM 8.9 08/16/2015 0850  ? CALCIUM 9.8 04/19/2015 0847  ? AST 11 (L) 10/05/2021 1803  ? AST 14 (L) 09/23/2021 0754  ? AST 17 08/16/2015 0850  ? AST 16 04/19/2015 0847  ? ALT 14 10/05/2021 1803  ? ALT 11 09/23/2021 0754  ? ALT 15 08/16/2015 0850  ? ALT 18 04/19/2015 0847  ? ALKPHOS 121 10/05/2021 1803  ? ALKPHOS 116 09/23/2021 0754  ? ALKPHOS 110 08/16/2015 0850  ? ALKPHOS 134  04/19/2015 0847  ? BILITOT 0.3 10/05/2021 1803  ? BILITOT 0.3 09/23/2021 0754  ? BILITOT 0.30 08/16/2015 0850  ? BILITOT <0.30 04/19/2015 0847  ? PROT 7.2 10/05/2021 1803  ? PROT 6.5 09/23/2021 0754  ? PROT 6.6 08/16/2015 0850  ? PROT 7.1 04/19/2015 0847  ? ALBUMIN 4.5 10/05/2021 1803  ? ALBUMIN 3.7 09/23/2021 0754  ? ALBUMIN 3.8 08/16/2015 0850  ? ALBUMIN 4.1 04/19/2015 0847  ? ? ?Studies/Results: ?DG UGI W SINGLE CM (SOL OR THIN BA) ? ?Result Date: 10/19/2021 ?CLINICAL DATA:  One day status post hernia repair previous Roux-en-Y surgery. Assess for leak. EXAM: DG UGI W SINGLE CM TECHNIQUE: Single contrast examination was then performed using thin liquid barium. This exam was performed by Alex Gardener, and was supervised and interpreted by Paulina Fusi MD. FLUOROSCOPY: Radiation Exposure Index (as provided by the fluoroscopic  device): 3 minutes 30 seconds 7612.5 micro gray meter squared COMPARISON:  CT 08/21/2021 FINDINGS: Esophagus:  Normal Esophageal motility:  Normal Gastroesophageal reflux:  None visualized. Ingested 55mm barium tablet: Not given Stomach: Hernia repair has a good appearance. No evidence contrast leak or obstruction. Gastric emptying: Normal.  Roux-en-Y gastric bypass. IMPRESSION: Good appearance following hernia repair. No evidence of obstruction or leak. Previous Roux-en-Y gastric bypass without complicating feature. Electronically Signed   By: Paulina Fusi M.D.   On: 10/19/2021 11:07   ? ? ? ?[name] [name] ?10/19/2021 ? ?  ?

## 2021-10-19 NOTE — Op Note (Signed)
NAME: [name] [name], [name] [name]. ?MEDICAL RECORD NO: [id number] ?ACCOUNT NO: [id number] ?DATE OF BIRTH: [birth year] ?FACILITY: [facility] ?LOCATION: [facility] ?PHYSICIAN: Leighton Ruff. Redmond Pulling, MD ? ?Operative Report  ? ?DATE OF PROCEDURE: 10/18/2021 ? ?PREOPERATIVE DIAGNOSES: ?1.  History of laparoscopic Roux-en-Y gastric bypass 2014. ?2.  Sliding hiatal hernia with epigastric pain and nausea and vomiting. ? ?POSTOPERATIVE DIAGNOSES: ?1.  History of laparoscopic Roux-en-Y gastric bypass 2014. ?2.  Sliding hiatal hernia with epigastric pain and nausea and vomiting. ? ?PROCEDURE:   ?1.  Xi robotic-assisted hiatal hernia repair with mesh. ?2.  Upper endoscopy. ?3.  Robotic-assisted 18-French gastrostomy tube placement. ?4.  Laparoscopic bilateral TAP blocks. ? ?SURGEON:  Leighton Ruff. Redmond Pulling, MD ? ?ASSISTANT:  Dr. Gurney Maxin. ? ?ANESTHESIA:  General. ? ?ESTIMATED BLOOD LOSS:  50. ? ?DRAINS:  18-French gastrotomy tube in the gastric remnant. ? ?INDICATIONS FOR PROCEDURE:  The patient is a 36 year old female who underwent laparoscopic Roux-en-Y gastric bypass in 2014.  She has had several interventions since her surgery. She has had a cholecystectomy.  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  Over the past few months, she has had epigastric pain with intermittent nausea and  ?vomiting.  Her workup which included upper GI CAT scan and EGD revealed a sliding hiatal hernia where portions of her pouch was actually in her mediastinum.  There were no other abnormalities found, so we discussed hiatal hernia repair with mesh and  ?possible gastrotomy tube placement since the patient has had some pertinent weight loss as a result of daily abdominal pain and cyclic vomiting. ? ?DESCRIPTION OF PROCEDURE:  After obtaining informed consent, the patient was taken to OR 3 at [facility] and  placed supine on the operating room table.  General endotracheal anesthesia was established.  Sequential compression devices were  ?placed.  Her abdomen was prepped and draped in the usual standard surgical fashion.  A surgical timeout was performed. ? ?She received IV antibiotic prior to skin incision.  She had received oral Tylenol in the holding area as well. ? ?Access to the abdomen was gained in the left upper quadrant at Palmer's point using optical entry.  A 0-degree 5 mm laparoscope was advanced through a 5 mm trocar and advanced through the abdominal wall and entered the abdominal cavity.  Pneumoperitoneum ? was smoothly established.  The tip of the trocar had gone through some of the small bowel mesentery.  However, there was no evidence of bleeding.  There was no evidence of enterotomy.  The patient was placed in reverse Trendelenburg.  We then went about ? placing robotic 8 mm trocars across her lower abdomen in a sort of horizontal line.  An 8 mm robotic trocar in the right mid abdomen, one slightly to the left of the umbilicus, one in the left mid abdomen and one in the left lateral abdominal wall, all  ?under direct visualization.  Laparoscopic bilateral TAP block was performed with a mixture of Exparel in saline for postoperative pain relief.  I then placed a Nathanson liver retractor through the subxiphoid trocar incision to lift up the left lobe of  ?the liver.  There were some adhesions between her Roux limb and the undersurface of the left lobe of the liver.  An assistant 5 mm trocar was placed in the right upper quadrant under direct visualization.  We then docked the [employer]  I went to  ?the console while my assistant stayed at the bedside. ? ?Using robotic scissors, I took down the attachments of the Roux limb to the undersurface of the left lobe of the liver.  I opened up the gastrohepatic ligament with vessel sealer at this point. The right crus of the diaphragm was identified.   There was  ?evidence of a sliding hiatal hernia.  The majority of her pouch was in her chest.  There was probably only about 1 inch of pouch in her abdominal cavity because I could see the gastrojejunostomy anastomosis.  Using the tip-up grasper from arm 4, I gently ? pulled over the pouch and Roux limb to the patient's left, exposing the right crus and the sliding hiatal hernia.  I gently incised the peritoneum medial to the right crus with the vessel sealer.  I then started doing blunt dissection up along the right ? crus and entered the mediastinum.  I came across anteriorly with the vessel sealer to the left side.  The pouch was more adhered or stuck to the left crus.  I therefore stayed on the right side and mobilized the pouch and distal esophagus in the  ?mediastinum.  The aorta was visualized.  The posterior attachments were taken down with the vessel sealer.  I identified the left crus of the diaphragm and at this point was able to mobilize the gastric remnant and adipose tissue off the left crus to  ?expose a retrogastric plane.  I was then able to come up the left side of the left crus and connected to my anterior mobilization.  The left and right crura were then completely free and exposed.  I then continued mobilizing circumferentially in the  ?mediastinum.  The anterior vagus nerve was identified.  I did not identify or see the posterior vagus nerve.  Circumferential mobilization continued in the mediastinum for about 7 cm.  At this point, the pouch had been completely reduced into the  ?abdominal cavity.  There was also about 2 cm of esophagus in the abdominal cavity as well. ? ?I obtained the Olympus endoscope and performed an upper endoscopy.  The endoscope was placed in the oropharynx and gently glided down the esophagus.  There was no evidence of injury to the esophagus.  The Z line was identified endoscopically.  It was  ?transilluminated and this corresponded to about 2 cm within the abdominal  cavity when viewed with the robotic camera.  The pouch was desufflated and the endoscope was removed and I went back to the surgeon console.  At this point, the assistant placed  ?several Ethibond sutures in the abdominal cavity.  I then reduced pneumoperitoneum to 10 mmHg.  I then reapproximated the left and right crura with 3 interrupted 0 Ethibond sutures.  My assistant obtained a piece of Gore Bio-A mesh intended for the  ?hiatus.  He trimmed it and he placed it in the abdominal cavity along with Ethibond sutures.  The U-shaped mesh was then placed around the hiatal closure.  The opening of the U-shape was anteriorly.  The mesh sat nicely over the cruroplasty.  I was able  ?to tuck it underneath the left lobe of the liver.  I then sutured the mesh to the cruroplasty with Ethibond.  I then placed a suture at the apex on the right side of the mesh to the diaphragm and in a similar fashion, placed a suture on the left  side of  ?the mesh to the diaphragm in a similar fashion. The mesh laid flat.  It was not redundant.  Because of the concern for potential dysphagia postop and ongoing nausea and vomiting postoperatively, I decided to place a gastrotomy tube in the gastric remnant ? for postoperative nutrition in case we needed it. ? ?There was some omentum stuck to the greater curvature of the stomach.  This was taken down with vessel sealer.  We then identified a place on the greater curvature of the stomach that would reach the abdominal wall in the left upper quadrant.  We removed ? the Ethibond suture needles and at this point, put a 2-0 silk suture in the abdomen.  A pursestring suture was made along the greater curvature in the mid body of the stomach.  I then made a gastrotomy with robotic scissors and was able to enter the  ?gastric remnant.  This was confirmed with visualization of gastric contents.  He then introduced two silk sutures that were long.  These were going to be used as transfascial sutures.   One was placed above the pursestring suture and one was placed below  ?and then two needles were removed.  We then undocked the robot and I scrubbed back in.  We obtained a MIC gastrostomy tube 18-French and m

## 2021-10-20 DIAGNOSIS — K449 Diaphragmatic hernia without obstruction or gangrene: Secondary | ICD-10-CM | POA: Diagnosis not present

## 2021-10-20 MED ORDER — OXYCODONE HCL 5 MG PO TABS: 5.0000 mg | ORAL_TABLET | Freq: Four times a day (QID) | ORAL | 0 refills | Status: DC | PRN

## 2021-10-20 MED ORDER — METOCLOPRAMIDE HCL 10 MG PO TABS: 10.0000 mg | ORAL_TABLET | Freq: Four times a day (QID) | ORAL | 0 refills | Status: DC | PRN

## 2021-10-20 NOTE — Discharge Summary (Signed)
Physician Discharge Summary  ?Patient ID: ?Jonette Eva Ast ?MRN: [id number] ?DOB/AGE: [birth year] 36 y.o. ? ?Admit date: 10/18/2021 ?Discharge date: 10/20/2021 ? ?Admission Diagnoses: Recurrent hiatal hernia ? ?Discharge Diagnoses:  ?Principal Problem: ?  History of repair of hiatal hernia ? ? ?Discharged Condition: good ? ?Hospital Course: Patient was admitted to the med surg floor after surgery.  UGI was obtained the following day.  Diet was then advanced as tolerated. Pt able to swallow full liquids with some pain.  By postop day 2, she was tolerating a full liquid diet and pain was controlled with oral medications.  She was urinating without difficulty and ambulating without assistance.  Patient was felt to be in stable condition for discharge to home. ? ? ?Consults: None ? ?Significant Diagnostic Studies: labs: cbc, bmet and UGI: no obstruction ? ?Treatments: IV hydration, analgesia: acetaminophen and Morphine, and surgery: hiatal hernia repair and G tube placement ? ?Discharge Exam: ?Blood pressure 125/83, pulse 88, temperature 97.8 ?F (36.6 ?C), temperature source Oral, resp. rate 18, height 5\' 4"  (1.626 m), weight 108.5 kg, SpO2 100 %. ?General appearance: alert and cooperative ?GI: soft, appropriately tender, nondistended ?Incision/Wound: clean, dry, intact ? ?Disposition: home ? ? ?Allergies as of 10/20/2021   ? ?   Reactions  ? Nsaids Other (See Comments)  ? Gastric bypass ?"  ? ?  ? ?  ?Medication List  ?  ? ?TAKE these medications   ? ?acetaminophen 500 MG tablet ?Commonly known as: TYLENOL ?Take 2 tablets (1,000 mg total) by mouth every 6 (six) hours as needed for moderate pain. ?  ?busPIRone 15 MG tablet ?Commonly known as: BUSPAR ?Take 15 mg by mouth 3 (three) times daily. ?  ?clotrimazole 10 MG troche ?Commonly known as: MYCELEX ?Take 10 mg by mouth daily as needed (Mouth Rash). ?  ?cyanocobalamin 1000 MCG/ML injection ?Commonly known as: (VITAMIN B-12) ?Inject 1 vial per week for 3 weeks, then 1 vial  per month thereafter ?  ?hydrOXYzine 50 MG capsule ?Commonly known as: VISTARIL ?Take 50 mg by mouth in the morning, at noon, in the evening, and at bedtime. ?  ?lamoTRIgine 150 MG tablet ?Commonly known as: LAMICTAL ?Take 300 mg by mouth in the morning. ?  ?lithium carbonate 300 MG CR tablet ?Commonly known as: LITHOBID ?Take 300 mg by mouth at bedtime. ?  ?Magnesium 500 MG Tabs ?Take 500 mg by mouth daily. ?  ?metFORMIN 500 MG tablet ?Commonly known as: GLUCOPHAGE ?Take 500 mg by mouth 2 (two) times daily. ?  ?metoCLOPramide 10 MG tablet ?Commonly known as: REGLAN ?Take 1 tablet (10 mg total) by mouth every 6 (six) hours as needed for nausea. ?What changed:  ?when to take this ?reasons to take this ?  ?nortriptyline 10 MG capsule ?Commonly known as: PAMELOR ?Take 1 capsule (10 mg total) by mouth at bedtime. ?  ?ondansetron 4 MG disintegrating tablet ?Commonly known as: ZOFRAN-ODT ?Take 4 mg by mouth every 8 (eight) hours as needed for nausea or vomiting. ?  ?ondansetron 4 MG tablet ?Commonly known as: ZOFRAN ?Take 2 tablets (8 mg total) by mouth every 8 (eight) hours as needed for nausea or vomiting. ?  ?oxyCODONE 5 MG immediate release tablet ?Commonly known as: Oxy IR/ROXICODONE ?Take 1-2 tablets (5-10 mg total) by mouth every 6 (six) hours as needed for moderate pain. ?  ?pantoprazole 20 MG tablet ?Commonly known as: PROTONIX ?TAKE 1 TABLET BY MOUTH EVERY DAY ?What changed:  ?how to take this ?when to take this ?  ?  Promethegan 25 MG suppository ?Generic drug: promethazine ?Place 1 suppository (25 mg total) rectally every 6 (six) hours as needed for nausea. ?  ?promethazine 6.25 MG/5ML syrup ?Commonly known as: PHENERGAN ?Take 10 mLs (12.5 mg total) by mouth every 6 (six) hours as needed for Nausea for up to 14 days ?  ?QUEtiapine 400 MG tablet ?Commonly known as: SEROQUEL ?Take 400 mg by mouth at bedtime. Take with (2)  50 mg for a total of 500 mg in bedtime ?  ?QUEtiapine 50 MG tablet ?Commonly known as:  SEROQUEL ?Take 100 mg by mouth at bedtime. Take with 400  mg for a total of 500 mg ?  ?SYRINGE-NEEDLE (DISP) 3 ML 25G X 1" 3 ML Misc ?Use 1 needle per application once weekly ?  ?tizanidine 2 MG capsule ?Commonly known as: ZANAFLEX ?Take 4 mg by mouth 3 (three) times daily as needed for muscle spasms. ?  ?valACYclovir 1000 MG tablet ?Commonly known as: VALTREX ?Take 1,000 mg by mouth daily as needed (Cold sores). ?  ? ?  ? ? Follow-up Information   ? ? Greer Pickerel, MD Follow up in 3 week(s).   ?Specialty: General Surgery ?Why: For wound re-check ?Contact information: ?[address] ?[address] ?[place] [id number] ?[phone] ? ? ?  ?  ? ?  ?  ? ?  ? ? ?Signed: ?Rosario Adie ?10/20/2021, 8:44 AM ? ? ?

## 2021-10-20 NOTE — Discharge Instructions (Signed)
EATING AFTER YOUR ESOPHAGEAL SURGERY ?(Stomach Fundoplication, Hiatal Hernia repair, Achalasia surgery, etc) ? ?###################################################################### ? ?EAT ?Start with a full liquid diet (see below) ?Gradually transition to a high fiber diet with a fiber supplement over the next month after discharge.   ? ?WALK ?Walk an hour a day.  Control your pain to do that.   ? ?CONTROL PAIN ?Control pain so that you can walk, sleep, tolerate sneezing/coughing, go up/down stairs. ? ?HAVE A BOWEL MOVEMENT DAILY ?Keep your bowels regular to avoid problems.  OK to try a laxative to override constipation.  OK to use an antidairrheal to slow down diarrhea.  Call if not better after 2 tries ? ?CALL IF YOU HAVE PROBLEMS/CONCERNS ?Call if you are still struggling despite following these instructions. ?Call if you have concerns not answered by these instructions ? ?###################################################################### ? ? ?After your esophageal surgery, expect some sticking with swallowing over the next 1-2 months.   ? ?If food sticks when you eat, it is called "dysphagia".  This is due to swelling around your esophagus at the wrap & hiatal diaphragm repair.  It will gradually ease off over the next few months.  To help you through this temporary phase, we start you out on a full liquid diet. ? ?Your first meal in the hospital was thin liquids.  You should have been given a full liquid diet by the time you left the hospital. Stay on clears and full liquids for the first week. Some patients may need to stay on a liquid diet for up to 2 weeks if having trouble swallowing.  Once tolerating that well, you can advance to pureed diet.   We ask patients to stay on a pureed diet for the 2nd-3rd week to avoid anything getting "stuck" near your recent surgery.  Don't be alarmed if your ability to swallow doesn't progress according to this plan.  Everyone is different and some diets can advance  more or less quickly.   ? ?It is often helpful to crush your medications or split them as they can sometimes stick, especially the first week or so. ? ? ?Some BASIC RULES to follow are: ?Maintain an upright position whenever eating or drinking. ?Take small bites - just a teaspoon size bite at a time. ?Eat slowly.  It may also help to eat only one food at a time. ?Consider nibbling through smaller, more frequent meals & avoid the urge to eat BIG meals ?Do not push through feelings of fullness, nausea, or bloatedness ?Do not mix solid foods and liquids in the same mouthful ?Try not to "wash foods down" with large gulps of liquids. ?Avoid carbonated (bubbly/fizzy) drinks.   ?Avoid foods that make you feel gassy or bloated.  Start with bland foods first.  Wait on trying greasy, fried, or spicy meals until you are tolerating more bland solids well. ?Understand that it will be hard to burp and belch at first.  This gradually improves with time.  Expect to be more gassy/flatulent/bloated initially.  Walking will help your body manage it better. ?Consider using medications for bloating that contain simethicone such as  Maalox or Gas-X  ?Consider crushing her medications, especially smaller pills.  The ability to swallow pills should get easier after a few weeks ?Eat in a relaxed atmosphere & minimize distractions. ?Avoid talking while eating.   ?Do not use straws. ?Following each meal, sit in an upright position (90 degree angle) for 60 to 90 minutes.  Going for a short walk can   help as well ?If food does stick, don't panic.  Try to relax and let the food pass on its own.  Sipping WARM LIQUID such as strong hot black tea can also help slide it down. ? ? ?Be gradual in changes & use common sense: ? ?-If you easily tolerating a certain "level" of foods, advance to the next level gradually ?-If you are having trouble swallowing a particular food, then avoid it.   ?-If food is sticking when you advance your diet, go back to  thinner previous diet (the lower LEVEL) for 1-2 days. ? ?LEVEL 2 = PUREED DIET ? ?Start 1- 2 WEEKS AFTER SURGERY IF YOU ARE TOLERATING A FULL LIQUID DIET EASILY ? ?-Foods in this group are pureed or blenderized to a smooth, mashed potato-like consistency.  ?-If necessary, the pureed foods can keep their shape with the addition of a thickening agent.   ?-Meat should be pureed to a smooth, pasty consistency.  Hot broth or gravy may be added to the pureed meat, approximately 1 oz. of liquid per 3 oz. serving of meat. ?-CAUTION:  If any foods do not puree into a smooth consistency, swallowing will be more difficult.  (For example, nuts or seeds sometimes do not blend well.) ? ?Hot Foods Cold Foods  ?Pureed scrambled eggs and cheese Pureed cottage cheese  ?Baby cereals Thickened juices and nectars  ?Thinned cooked cereals (no lumps) Thickened milk or eggnog  ?Pureed French toast or pancakes Ensure  ?Mashed potatoes Ice cream  ?Pureed parsley, au gratin, scalloped potatoes, candied sweet potatoes Fruit or Italian ice, sherbet  ?Pureed buttered or alfredo noodles Plain yogurt  ?Pureed vegetables (no corn or peas) Instant breakfast  ?Pureed soups and creamed soups Smooth pudding, mousse, custard  ?Pureed scalloped apples Whipped gelatin  ?Gravies Sugar, syrup, honey, jelly  ?Sauces, cheese, tomato, barbecue, white, creamed Cream  ?Any baby food Creamer  ?Alcohol in moderation (not beer or champagne) Margarine  ?Coffee or tea Mayonnaise  ? Ketchup, mustard  ? Apple sauce  ? ?SAMPLE MENU:  PUREED DIET ?Breakfast Lunch Dinner  ?Orange juice, 1/2 cup ?Cream of wheat, 1/2 cup Pineapple juice, 1/2 cup Pureed turkey, barley soup, 3/4 cup ?Pureed Hawaiian chicken, 3 oz  ?Scrambled eggs, mashed or blended with cheese, 1/2 cup ?Tea or coffee, 1 cup  ?Whole milk, 1 cup  ?Non-dairy creamer, 2 Tbsp. Mashed potatoes, 1/2 cup ?Pureed cooled broccoli, 1/2 cup ?Apple sauce, 1/2 cup ?Coffee or tea Mashed potatoes, 1/2 cup ?Pureed spinach,  1/2 cup ?Frozen yogurt, 1/2 cup ?Tea or coffee  ? ? ? ? ?LEVEL 3 = SOFT DIET ? ?After your first 4 weeks, you can advance to a soft diet.   ?Keep on this diet until everything goes down easily. ? ?Hot Foods Cold Foods  ?White fish Cottage cheese  ?Stuffed fish Junior baby fruit  ?Baby food meals Semi thickened juices  ?Minced soft cooked, scrambled, poached eggs nectars  ?Souffle & omelets Ripe mashed bananas  ?Cooked cereals Canned fruit, pineapple sauce, milk  ?potatoes Milkshake  ?Buttered or Alfredo noodles Custard  ?Cooked cooled vegetable Puddings, including tapioca  ?Sherbet Yogurt  ?Vegetable soup or alphabet soup Fruit ice, Italian ice  ?Gravies Whipped gelatin  ?Sugar, syrup, honey, jelly Junior baby desserts  ?Sauces:  Cheese, creamed, barbecue, tomato, white Cream  ?Coffee or tea Margarine  ? ?SAMPLE MENU:  LEVEL 3 ?Breakfast Lunch Dinner  ?Orange juice, 1/2 cup ?Oatmeal, 1/2 cup ?Scrambled eggs with cheese, 1/2 cup ?Decaffeinated tea,   1 cup ?Whole milk, 1 cup ?Non-dairy creamer, 2 Tbsp Pineapple juice, 1/2 cup ?Minced beef, 3 oz ?Gravy, 2 Tbsp ?Mashed potatoes, 1/2 cup ?Minced fresh broccoli, 1/2 cup ?Applesauce, 1/2 cup ?Coffee, 1 cup Turkey, barley soup, 3/4 cup ?Minced Hawaiian chicken, 3 oz ?Mashed potatoes, 1/2 cup ?Cooked spinach, 1/2 cup ?Frozen yogurt, 1/2 cup ?Non-dairy creamer, 2 Tbsp  ? ? ? ? ?LEVEL 4 = CHOPPED DIET ? ?-After all the foods in level 3 (soft diet) are passing through well you should advance up to more chopped foods.  ?-It is still important to cut these foods into small pieces and eat slowly. ? ?Hot Foods Cold Foods  ?Poultry Cottage cheese  ?Chopped Swedish meatballs Yogurt  ?Meat salads (ground or flaked meat) Milk  ?Flaked fish (tuna) Milkshakes  ?Poached or scrambled eggs Soft, cold, dry cereal  ?Souffles and omelets Fruit juices or nectars  ?Cooked cereals Chopped canned fruit  ?Chopped French toast or pancakes Canned fruit cocktail  ?Noodles or pasta (no rice) Pudding,  mousse, custard  ?Cooked vegetables (no frozen peas, corn, or mixed vegetables) Green salad  ?Canned small sweet peas Ice cream  ?Creamed soup or vegetable soup Fruit ice, Italian ice  ?Pureed vegetable soup o

## 2021-10-21 ENCOUNTER — Encounter (HOSPITAL_COMMUNITY): Payer: Self-pay | Admitting: General Surgery

## 2021-10-22 ENCOUNTER — Other Ambulatory Visit (HOSPITAL_BASED_OUTPATIENT_CLINIC_OR_DEPARTMENT_OTHER): Payer: Self-pay

## 2021-10-22 MED ORDER — OXYCODONE HCL 5 MG PO TABS
ORAL_TABLET | ORAL | 0 refills | Status: DC
  Filled 2021-10-22 (×2): qty 15, 4d supply, fill #0

## 2021-10-22 MED ORDER — METHOCARBAMOL 500 MG PO TABS
ORAL_TABLET | ORAL | 0 refills | Status: DC
  Filled 2021-10-22: qty 20, 5d supply, fill #0

## 2021-10-23 ENCOUNTER — Other Ambulatory Visit (HOSPITAL_BASED_OUTPATIENT_CLINIC_OR_DEPARTMENT_OTHER): Payer: Self-pay

## 2021-10-24 ENCOUNTER — Other Ambulatory Visit (HOSPITAL_BASED_OUTPATIENT_CLINIC_OR_DEPARTMENT_OTHER): Payer: Self-pay

## 2021-10-25 ENCOUNTER — Other Ambulatory Visit (HOSPITAL_BASED_OUTPATIENT_CLINIC_OR_DEPARTMENT_OTHER): Payer: Self-pay

## 2021-10-27 ENCOUNTER — Emergency Department (HOSPITAL_BASED_OUTPATIENT_CLINIC_OR_DEPARTMENT_OTHER): Payer: 59

## 2021-10-27 ENCOUNTER — Encounter (HOSPITAL_BASED_OUTPATIENT_CLINIC_OR_DEPARTMENT_OTHER): Payer: Self-pay | Admitting: Obstetrics and Gynecology

## 2021-10-27 ENCOUNTER — Other Ambulatory Visit: Payer: Self-pay

## 2021-10-27 ENCOUNTER — Emergency Department (HOSPITAL_BASED_OUTPATIENT_CLINIC_OR_DEPARTMENT_OTHER)
Admission: EM | Admit: 2021-10-27 | Discharge: 2021-10-27 | Disposition: A | Discharge status: Home / Self Care | Payer: 59 | Attending: Emergency Medicine | Admitting: Emergency Medicine

## 2021-10-27 DIAGNOSIS — G8918 Other acute postprocedural pain: Secondary | ICD-10-CM | POA: Diagnosis not present

## 2021-10-27 DIAGNOSIS — R1012 Left upper quadrant pain: Secondary | ICD-10-CM | POA: Insufficient documentation

## 2021-10-27 DIAGNOSIS — Z7984 Long term (current) use of oral hypoglycemic drugs: Secondary | ICD-10-CM | POA: Insufficient documentation

## 2021-10-27 LAB — COMPREHENSIVE METABOLIC PANEL
ALT: 14 U/L (ref 0–44)
AST: 14 U/L — ABNORMAL LOW (ref 15–41)
Albumin: 4.2 g/dL (ref 3.5–5.0)
Alkaline Phosphatase: 154 U/L — ABNORMAL HIGH (ref 38–126)
Anion gap: 10 (ref 5–15)
BUN: 11 mg/dL (ref 6–20)
CO2: 23 mmol/L (ref 22–32)
Calcium: 9.5 mg/dL (ref 8.9–10.3)
Chloride: 106 mmol/L (ref 98–111)
Creatinine, Ser: 0.56 mg/dL (ref 0.44–1.00)
GFR, Estimated: >60 mL/min (ref >60)
Glucose, Bld: 83 mg/dL (ref 70–99)
Potassium: 3.4 mmol/L — ABNORMAL LOW (ref 3.5–5.1)
Sodium: 139 mmol/L (ref 135–145)
Total Bilirubin: 0.3 mg/dL (ref 0.3–1.2)
Total Protein: 7.3 g/dL (ref 6.5–8.1)

## 2021-10-27 LAB — CBC
HCT: 39.2 % (ref 36.0–46.0)
Hemoglobin: 12.8 g/dL (ref 12.0–15.0)
MCH: 27.6 pg (ref 26.0–34.0)
MCHC: 32.7 g/dL (ref 30.0–36.0)
MCV: 84.5 fL (ref 80.0–100.0)
Platelets: 421 10*3/uL — ABNORMAL HIGH (ref 150–400)
RBC: 4.64 MIL/uL (ref 3.87–5.11)
RDW: 14.7 % (ref 11.5–15.5)
WBC: 8.8 10*3/uL (ref 4.0–10.5)
nRBC: 0 % (ref 0.0–0.2)

## 2021-10-27 LAB — URINALYSIS, ROUTINE W REFLEX MICROSCOPIC
Bilirubin Urine: NEGATIVE
Glucose, UA: NEGATIVE mg/dL
Ketones, ur: NEGATIVE mg/dL
Nitrite: NEGATIVE
Protein, ur: 30 mg/dL — AB
Specific Gravity, Urine: 1.025 (ref 1.005–1.030)
pH: 6 (ref 5.0–8.0)

## 2021-10-27 LAB — LIPASE, BLOOD: Lipase: 45 U/L (ref 11–51)

## 2021-10-27 LAB — LACTIC ACID, PLASMA: Lactic Acid, Venous: 1.3 mmol/L (ref 0.5–1.9)

## 2021-10-27 IMAGING — CT CT ABD-PELV W/ CM
2 of 5 series · 16 of 46 positions shown, 18 images · IV contrast (APPLIED)
Comparison: [DATE] and older exams.

CLINICAL DATA: Patient reports to the ER for abdominal pain, post
surgical repair of hernia. Patient reports she has also had a g-tube
placed due to post surgical nausea. Patient reports they called the
on-call surgeon. Patient states she is having pain with minimal
movement to the left side of her abdomen.

EXAM:
CT ABDOMEN AND PELVIS WITH CONTRAST
TECHNIQUE: Multidetector CT imaging of the abdomen and pelvis was performed
using the standard protocol following bolus administration of
intravenous contrast.

[Series 2: abd pel w · axial · 0.83mm/px · z∈[+622,+1072]mm · 13 of 102 slices shown, 15 images]
[im 6/102  soft-tissue]
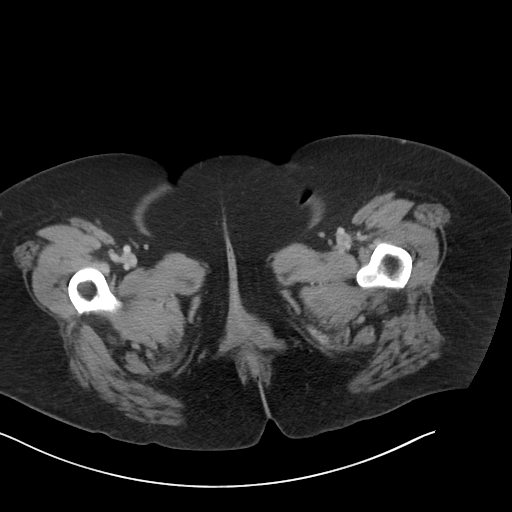
[im 6/102  bone]
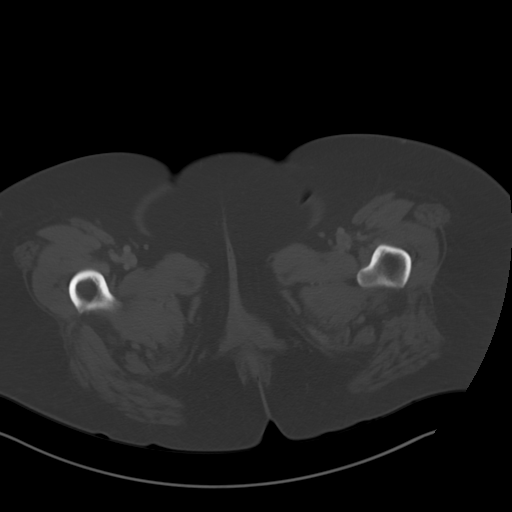
[im 12/102  soft-tissue]
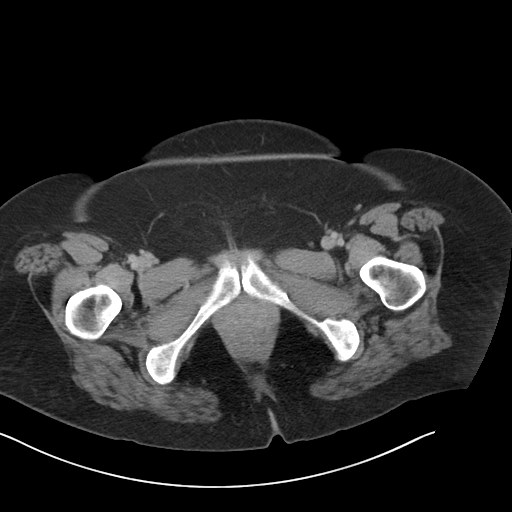
[im 23/102  soft-tissue]
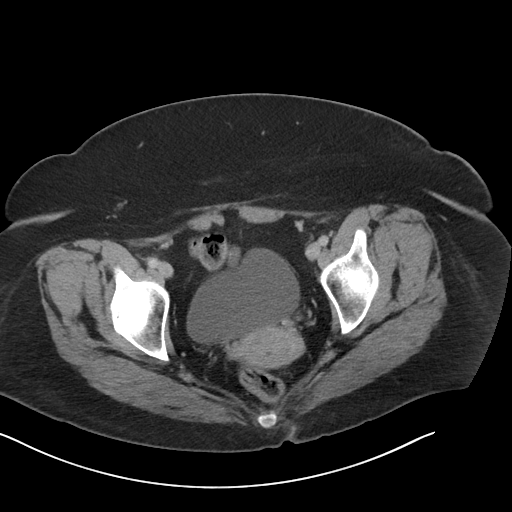
[im 29/102  soft-tissue]
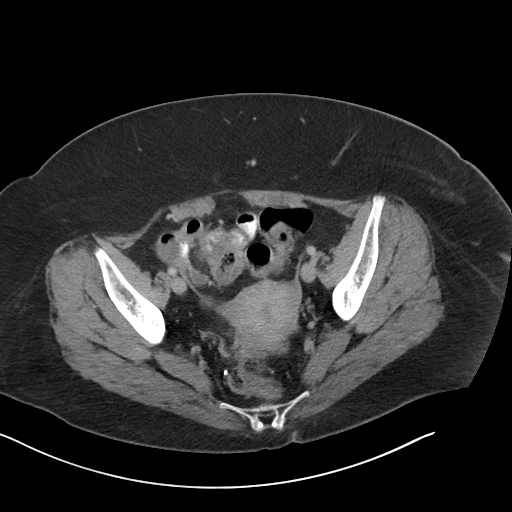
[im 34/102  soft-tissue]
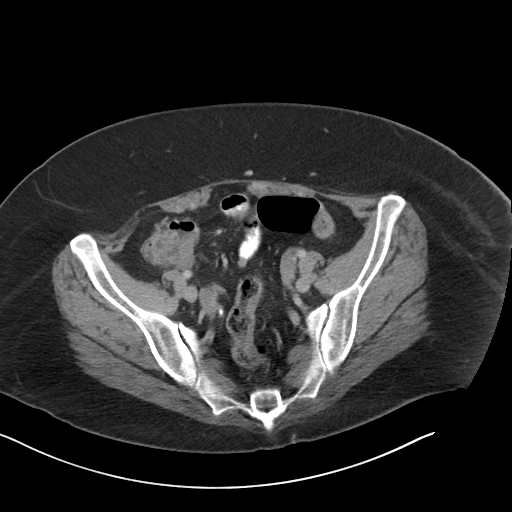
[im 45/102  soft-tissue]
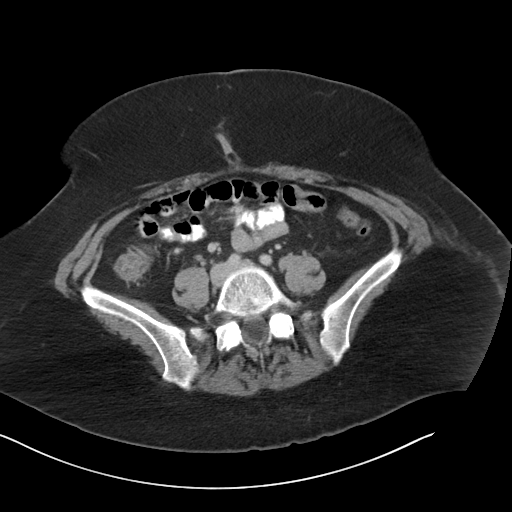
[im 51/102  soft-tissue]
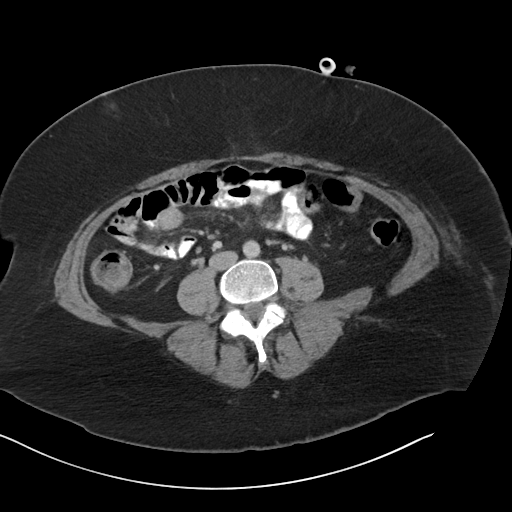
[im 57/102  soft-tissue]
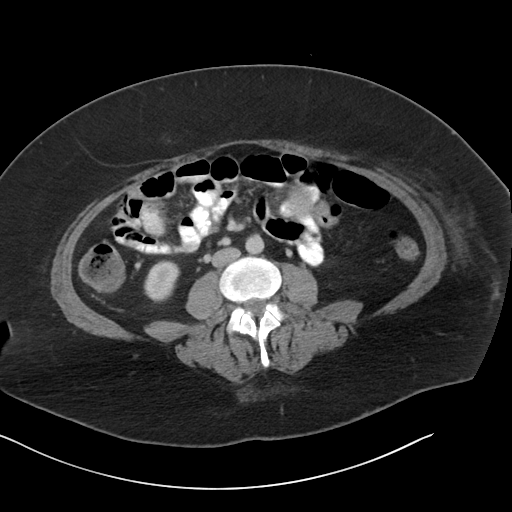
[im 68/102  soft-tissue]
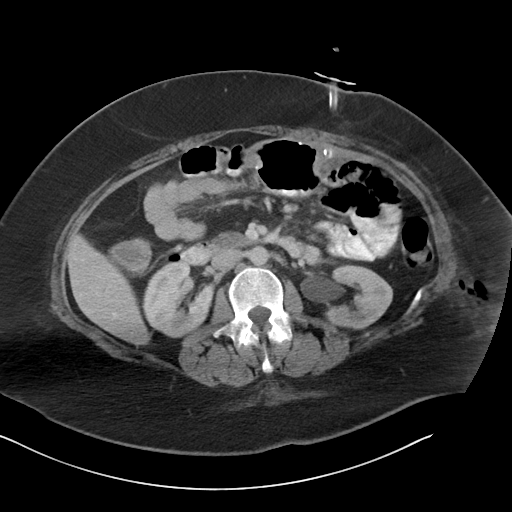
[im 68/102  bone]
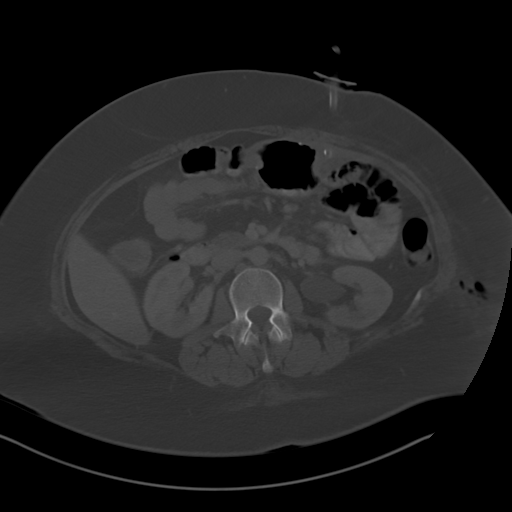
[im 73/102  soft-tissue]
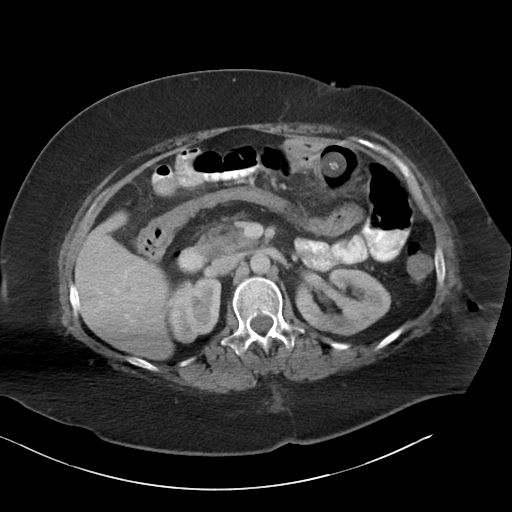
[im 79/102  soft-tissue]
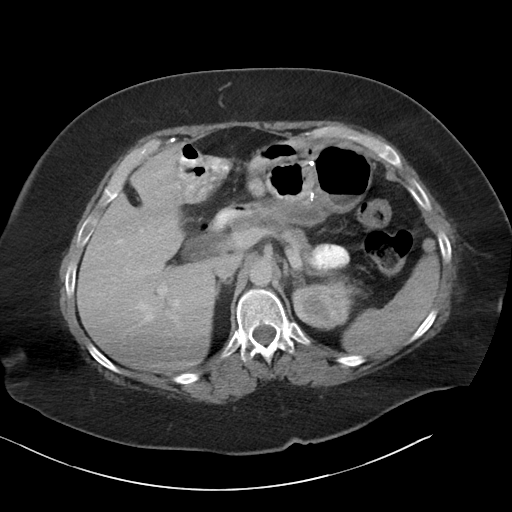
[im 90/102  soft-tissue]
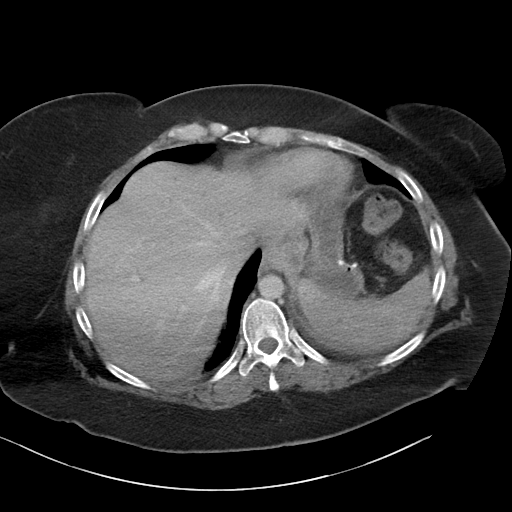
[im 96/102  soft-tissue]
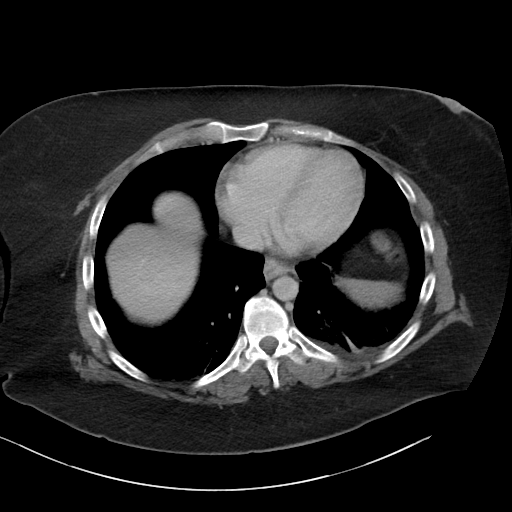

[Series 5: coronal · coronal · 1.01mm/px · 3 of 114 slices shown]
[im 38/114  soft-tissue]
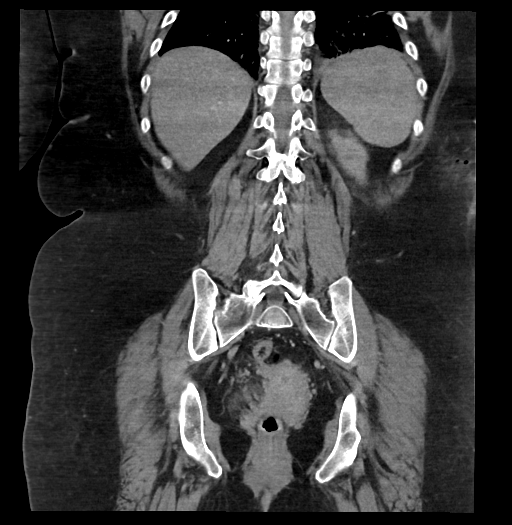
[im 51/114  soft-tissue]
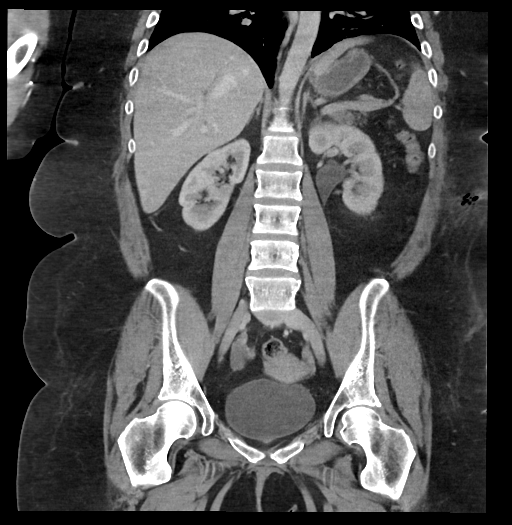
[im 63/114  soft-tissue]
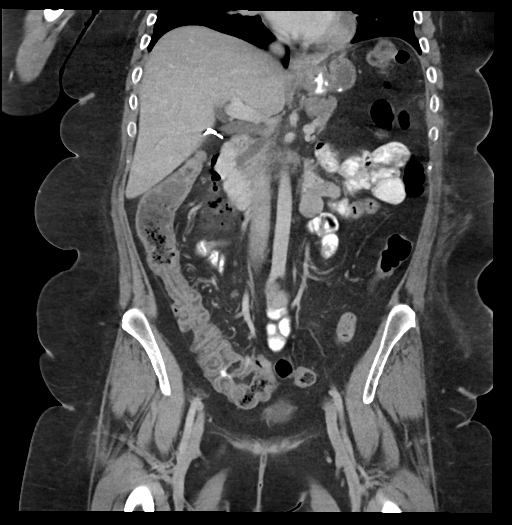

[16 of 46 positions shown; findings below may reference images not displayed]

RADIATION DOSE REDUCTION: This exam was performed according to the
departmental dose-optimization program which includes automated
exposure control, adjustment of the mA and/or kV according to
patient size and/or use of iterative reconstruction technique.

CONTRAST:  100mL OMNIPAQUE IOHEXOL 300 MG/ML  SOLN
FINDINGS: Lower chest: Trace left pleural effusion. Minor dependent lung base
subsegmental atelectasis.

Hepatobiliary: Normal liver. Mild intrahepatic bile duct dilation.
Status post cholecystectomy. Common bile duct dilated to 1 cm.
Findings stable from the most recent prior study.

Pancreas: Unremarkable. No pancreatic ductal dilatation or
surrounding inflammatory changes.

Spleen: Normal in size without focal abnormality.

Adrenals/Urinary Tract: Adrenal glands are unremarkable. Kidneys are
normal, without renal calculi, focal lesion, or hydronephrosis.
Bladder is unremarkable.

Stomach/Bowel: Changes from gastric bypass surgery. Percutaneous
gastrostomy tube lies in the anterior stomach, stomach at this
location well positioned against the anterior peritoneal wall. Small
bowel anastomosis staple line lies inferior to the stomach. No
evidence of bowel obstruction. No small bowel or colonic wall
thickening. Mild hazy opacity lies adjacent to the gastric cardia at
the gastroesophageal junction where there is mild wall thickening,
may reflect post procedure edema.

Vascular/Lymphatic: No significant vascular findings are present. No
enlarged abdominal or pelvic lymph nodes.

Reproductive: Uterus and bilateral adnexa are unremarkable.

Other: No ascites.  No free air.  No abdominal wall hernia.

Hazy opacity and soft tissue air is noted along the left flank.

Musculoskeletal: No acute or significant osseous findings.
IMPRESSION: 1. No acute findings or evidence of an operative complication.
2. Mild wall thickening of the gastric cardia and the
gastroesophageal junction with hazy increased attenuation in the
adjacent fat consistent with postop edema from the hiatal hernia
surgery performed on [DATE].
3. Well-positioned gastrostomy tube.
4. Previous gastric bypass surgery, changes similar stable from the
prior CT. Prior cholecystectomy.

## 2021-10-27 MED ORDER — FAMOTIDINE 20 MG PO TABS: 20.0000 mg | ORAL_TABLET | Freq: Two times a day (BID) | ORAL | 0 refills | Status: DC

## 2021-10-27 MED ORDER — LACTATED RINGERS IV SOLN: INTRAVENOUS | Status: DC

## 2021-10-27 MED ORDER — HYDROMORPHONE HCL 1 MG/ML IJ SOLN
1.0000 mg | Freq: Once | INTRAMUSCULAR | Status: AC
  Administered 2021-10-27: 1 mg via INTRAVENOUS
  Filled 2021-10-27: qty 1

## 2021-10-27 MED ORDER — ONDANSETRON HCL 4 MG/2ML IJ SOLN
4.0000 mg | Freq: Once | INTRAMUSCULAR | Status: AC
  Administered 2021-10-27: 4 mg via INTRAVENOUS
  Filled 2021-10-27: qty 2

## 2021-10-27 MED ORDER — OXYCODONE HCL 5 MG PO TABS: 5.0000 mg | ORAL_TABLET | ORAL | 0 refills | Status: DC | PRN

## 2021-10-27 MED ORDER — IOHEXOL 300 MG/ML  SOLN
100.0000 mL | Freq: Once | INTRAMUSCULAR | Status: AC | PRN
  Administered 2021-10-27: 100 mL via INTRAVENOUS

## 2021-10-27 NOTE — ED Provider Notes (Signed)
?[facility] EM[facility] DEPT ?Provider Note ? ? ?CSN: [id number] ?Arrival date & time: 10/27/21  1236 ? ?  ? ?History ? ?Chief Complaint  ?Patient presents with  ? Abdominal Pain  ? ? ?[name] DixonSamantha D [name] is a 36 y.o. female. ? ?HPI ?Patient has history of Roux-en-Y gastric bypass.  She had a hiatal hernia repair [phone]\5\[id number].  Patient reports she was doing well postoperatively but yesterday started getting severe pain in the left upper abdomen.  She reports the pain was gradual and continued to escalate.  No vomiting, she reports a couple episodes of loose stool but not persistent diarrhea.  No pain or burning with urination.  No fever no chills.  Patient reports she has been able to take fluids but she also has a G-tube to supplement if needed.  She denies any chest pain or shortness of breath.  She reports it does hurt if she takes a deep breath.  She called on-call surgery and was recommended to come to the emergency department for further evaluation. ?  ? ?Home Medications ?Prior to Admission medications   ?Medication Sig Start Date End Date Taking? Authorizing Provider  ?famotidine (PEPCID) 20 MG tablet Take 1 tablet (20 mg total) by mouth 2 (two) times daily. 10/27/21  Yes Arby BarrettePfeiffer, Wendee Hata, MD  ?oxyCODONE (ROXICODONE) 5 MG immediate release tablet Take 1 tablet (5 mg total) by mouth every 4 (four) hours as needed for severe pain. 10/27/21  Yes Arby BarrettePfeiffer, Jolon Degante, MD  ?acetaminophen (TYLENOL) 500 MG tablet Take 2 tablets (1,000 mg total) by mouth every 6 (six) hours as needed for moderate pain. 02/13/19   Fayrene Helperran, Bowie, PA-C  ?busPIRone (BUSPAR) 15 MG tablet Take 15 mg by mouth 3 (three) times daily. 11/01/19   [provider]  ?clotrimazole (MYCELEX) 10 MG troche Take 10 mg by mouth daily as needed (Mouth Rash). 09/24/20   [provider]  ?cyanocobalamin (,VITAMIN B-12,) 1000 MCG/ML injection Inject 1 vial per week for 3 weeks, then 1 vial per month thereafter 06/21/20   Willow OraAndy, Camille L, MD   ?hydrOXYzine (VISTARIL) 50 MG capsule Take 50 mg by mouth in the morning, at noon, in the evening, and at bedtime. 12/26/20   [provider]  ?lamoTRIgine (LAMICTAL) 150 MG tablet Take 300 mg by mouth in the morning. 04/10/20   [provider]  ?lithium carbonate (LITHOBID) 300 MG CR tablet Take 300 mg by mouth at bedtime.    [provider]  ?Magnesium 500 MG TABS Take 500 mg by mouth daily.    [provider]  ?metFORMIN (GLUCOPHAGE) 500 MG tablet Take 500 mg by mouth 2 (two) times daily. 11/01/19   [provider]  ?methocarbamol (ROBAXIN) 500 MG tablet Take 1 tablet (500 mg total) by mouth every 6 (six) hours as needed for muscle spasms for up to 10 days 10/22/21     ?metoCLOPramide (REGLAN) 10 MG tablet Take 1 tablet (10 mg total) by mouth every 6 (six) hours as needed for nausea. 10/20/21   Romie Leveehomas, Alicia, MD  ?nortriptyline (PAMELOR) 10 MG capsule Take 1 capsule (10 mg total) by mouth at bedtime. 10/01/21   Kirsteins, Victorino SparrowAndrew E, MD  ?ondansetron (ZOFRAN) 4 MG tablet Take 2 tablets (8 mg total) by mouth every 8 (eight) hours as needed for nausea or vomiting. 07/21/21   Karie SodaGross, Steven, MD  ?ondansetron (ZOFRAN-ODT) 4 MG disintegrating tablet Take 4 mg by mouth every 8 (eight) hours as needed for nausea or vomiting.    [provider]  ?oxyCODONE (OXY IR/ROXICODONE) 5 MG immediate release tablet Take 1-2 tablets (5-10 mg total) by mouth every 6 (six) hours as needed for moderate pain. 10/20/21   Romie Levee, MD  ?oxyCODONE (OXY IR/ROXICODONE) 5 MG immediate release tablet Take 1 tablet (5 mg total) by mouth every 6 (six) hours as needed for Pain 10/22/21     ?pantoprazole (PROTONIX) 20 MG tablet TAKE 1 TABLET BY MOUTH EVERY DAY ?Patient taking differently: 20 mg 2 (two) times daily. 06/21/21   Willow Ora, MD  ?promethazine (PHENERGAN) 25 MG suppository Place 1 suppository (25 mg total) rectally every 6 (six) hours as needed for nausea. 07/21/21   Karie Soda, MD   ?promethazine (PHENERGAN) 6.25 MG/5ML syrup Take 10 mLs (12.5 mg total) by mouth every 6 (six) hours as needed for Nausea for up to 14 days 07/21/21   Karie Soda, MD  ?QUEtiapine (SEROQUEL) 400 MG tablet Take 400 mg by mouth at bedtime. Take with (2)  50 mg for a total of 500 mg in bedtime 08/24/19   [provider]  ?QUEtiapine (SEROQUEL) 50 MG tablet Take 100 mg by mouth at bedtime. Take with 400  mg for a total of 500 mg 06/27/20   [provider]  ?SYRINGE-NEEDLE, DISP, 3 ML 25G X 1" 3 ML MISC Use 1 needle per application once weekly 06/21/20   Willow Ora, MD  ?tizanidine (ZANAFLEX) 2 MG capsule Take 4 mg by mouth 3 (three) times daily as needed for muscle spasms. 09/10/21   [provider]  ?valACYclovir (VALTREX) 1000 MG tablet Take 1,000 mg by mouth daily as needed (Cold sores).    [provider]  ?   ? ?Allergies    ?Nsaids   ? ?Review of Systems   ?Review of Systems ?10 systems reviewed and negative except as per HPI ?Physical Exam ?Updated Vital Signs ?BP (!) 131/99   Pulse 86   Temp 98.2 ?F (36.8 ?C)   Resp 16   Ht 5\' 4"  (1.626 m)   Wt 108 kg   LMP 10/13/2021 (Approximate)   SpO2 97%   BMI 40.87 kg/m?  ?Physical Exam ?Constitutional:   ?   Comments: Alert nontoxic no respiratory distress  ?HENT:  ?   Mouth/Throat:  ?   Pharynx: Oropharynx is clear.  ?Eyes:  ?   Extraocular Movements: Extraocular movements intact.  ?Cardiovascular:  ?   Rate and Rhythm: Normal rate and regular rhythm.  ?Pulmonary:  ?   Effort: Pulmonary effort is normal.  ?   Breath sounds: Normal breath sounds.  ?Abdominal:  ?   Comments: Abdomen has multiple trocar sites.  All are clean and dry without erythema or drainage.  Patient has a G-tube in place.  Site clean and dry.  Positive bowel sounds.  Abdomen is tender in the left mid and upper quadrant.  Right side of abdomen and lower abdomen is nontender.  No guarding in these areas.  ?Musculoskeletal:     ?   General: No swelling or  tenderness. Normal range of motion.  ?   Right lower leg: No edema.  ?   Left lower leg: No edema.  ?Skin: ?   General: Skin is warm and dry.  ?Neurological:  ?   General: No focal deficit present.  ?   Mental Status: She is oriented to person, place, and time.  ?   Motor: No weakness.  ?   Coordination: Coordination normal.  ? ? ?ED Results /  Procedures / Treatments   ?Labs ?(all labs ordered are listed, but only abnormal results are displayed) ?Labs Reviewed  ?COMPREHENSIVE METABOLIC PANEL - Abnormal; Notable for the following components:  ?    Result Value  ? Potassium 3.4 (*)   ? AST 14 (*)   ? Alkaline Phosphatase 154 (*)   ? All other components within normal limits  ?CBC - Abnormal; Notable for the following components:  ? Platelets 421 (*)   ? All other components within normal limits  ?URINALYSIS, ROUTINE W REFLEX MICROSCOPIC - Abnormal; Notable for the following components:  ? APPearance HAZY (*)   ? Hgb urine dipstick MODERATE (*)   ? Protein, ur 30 (*)   ? Leukocytes,Ua TRACE (*)   ? All other components within normal limits  ?LIPASE, BLOOD  ?LACTIC ACID, PLASMA  ?LACTIC ACID, PLASMA  ? ? ?EKG ?None ? ?Radiology ?CT Abdomen Pelvis W Contrast ? ?Result Date: 10/27/2021 ?CLINICAL DATA:  Patient reports to the ER for abdominal pain, post surgical repair of hernia. Patient reports she has also had a g-tube placed due to post surgical nausea. Patient reports they called the on-call surgeon. Patient states she is having pain with minimal movement to the left side of her abdomen. EXAM: CT ABDOMEN AND PELVIS WITH CONTRAST TECHNIQUE: Multidetector CT imaging of the abdomen and pelvis was performed using the standard protocol following bolus administration of intravenous contrast. RADIATION DOSE REDUCTION: This exam was performed according to the departmental dose-optimization program which includes automated exposure control, adjustment of the mA and/or kV according to patient size and/or use of iterative  reconstruction technique. CONTRAST:  OMNIPAQUE IOHEXOL 300 MG/ML  SOLN COMPARISON:  08/21/2021 and older exams. FINDINGS: Lower chest: Trace left pleural effusion. Minor dependent lung base subsegmental atelectasis. Hepatobi

## 2021-10-27 NOTE — Discharge Instructions (Addendum)
1.  Continue your Protonix and start taking Pepcid twice daily as well. ?2.  Since you are still in the postoperative state another prescription for pain medication has been given for pain control until you see your surgeon for recheck.  Narcotic pain medications can be very addictive, use as little as possible to control pain.  Try taking Tylenol when pain is not too severe.  Call your surgeon tomorrow morning. ?3.  Return to the emergency department if you develop a fever, vomiting or worsening symptoms. ?

## 2021-10-27 NOTE — ED Triage Notes (Signed)
Patient reports to the ER for abdominal pain, post surgical repair of hernia. Patient reports she has also had a g-tube placed due to post surgical nausea. Patient reports they called the on-call surgeon. Patient states she is having pain with minimal movement to the left side of her abdomen.  ?

## 2021-10-30 ENCOUNTER — Other Ambulatory Visit (HOSPITAL_BASED_OUTPATIENT_CLINIC_OR_DEPARTMENT_OTHER): Payer: Self-pay

## 2021-10-30 MED ORDER — HYDROCODONE-ACETAMINOPHEN 5-325 MG PO TABS
ORAL_TABLET | ORAL | 0 refills | Status: DC
  Filled 2021-10-30: qty 20, 5d supply, fill #0

## 2021-11-06 ENCOUNTER — Other Ambulatory Visit (HOSPITAL_BASED_OUTPATIENT_CLINIC_OR_DEPARTMENT_OTHER): Payer: Self-pay

## 2021-11-06 MED ORDER — METHOCARBAMOL 500 MG PO TABS
ORAL_TABLET | ORAL | 0 refills | Status: DC
  Filled 2021-11-06: qty 30, 8d supply, fill #0

## 2021-11-06 MED ORDER — HYDROCODONE-ACETAMINOPHEN 5-325 MG PO TABS
ORAL_TABLET | ORAL | 0 refills | Status: DC
  Filled 2021-11-06: qty 15, 4d supply, fill #0

## 2021-11-25 ENCOUNTER — Emergency Department (HOSPITAL_BASED_OUTPATIENT_CLINIC_OR_DEPARTMENT_OTHER)
Admission: EM | Admit: 2021-11-25 | Discharge: 2021-11-25 | Disposition: A | Discharge status: Home / Self Care | Payer: 59 | Attending: Emergency Medicine | Admitting: Emergency Medicine

## 2021-11-25 ENCOUNTER — Other Ambulatory Visit: Payer: Self-pay

## 2021-11-25 ENCOUNTER — Emergency Department (HOSPITAL_BASED_OUTPATIENT_CLINIC_OR_DEPARTMENT_OTHER): Payer: 59

## 2021-11-25 ENCOUNTER — Encounter (HOSPITAL_BASED_OUTPATIENT_CLINIC_OR_DEPARTMENT_OTHER): Payer: Self-pay

## 2021-11-25 DIAGNOSIS — I10 Essential (primary) hypertension: Secondary | ICD-10-CM | POA: Insufficient documentation

## 2021-11-25 DIAGNOSIS — Z79899 Other long term (current) drug therapy: Secondary | ICD-10-CM | POA: Diagnosis not present

## 2021-11-25 DIAGNOSIS — K9423 Gastrostomy malfunction: Secondary | ICD-10-CM | POA: Diagnosis not present

## 2021-11-25 DIAGNOSIS — R1110 Vomiting, unspecified: Secondary | ICD-10-CM | POA: Diagnosis present

## 2021-11-25 DIAGNOSIS — R112 Nausea with vomiting, unspecified: Secondary | ICD-10-CM | POA: Insufficient documentation

## 2021-11-25 DIAGNOSIS — K9429 Other complications of gastrostomy: Secondary | ICD-10-CM

## 2021-11-25 DIAGNOSIS — Z7984 Long term (current) use of oral hypoglycemic drugs: Secondary | ICD-10-CM | POA: Insufficient documentation

## 2021-11-25 DIAGNOSIS — E039 Hypothyroidism, unspecified: Secondary | ICD-10-CM | POA: Insufficient documentation

## 2021-11-25 LAB — URINALYSIS, ROUTINE W REFLEX MICROSCOPIC
Bilirubin Urine: NEGATIVE
Glucose, UA: NEGATIVE mg/dL
Hgb urine dipstick: NEGATIVE
Ketones, ur: NEGATIVE mg/dL
Leukocytes,Ua: NEGATIVE
Nitrite: NEGATIVE
Protein, ur: NEGATIVE mg/dL
Specific Gravity, Urine: <1.005 — ABNORMAL LOW (ref 1.005–1.030)
pH: 5.5 (ref 5.0–8.0)

## 2021-11-25 LAB — COMPREHENSIVE METABOLIC PANEL
ALT: 18 U/L (ref 0–44)
AST: 10 U/L — ABNORMAL LOW (ref 15–41)
Albumin: 4.5 g/dL (ref 3.5–5.0)
Alkaline Phosphatase: 173 U/L — ABNORMAL HIGH (ref 38–126)
Anion gap: 12 (ref 5–15)
BUN: 10 mg/dL (ref 6–20)
CO2: 23 mmol/L (ref 22–32)
Calcium: 9.8 mg/dL (ref 8.9–10.3)
Chloride: 105 mmol/L (ref 98–111)
Creatinine, Ser: 0.57 mg/dL (ref 0.44–1.00)
GFR, Estimated: >60 mL/min (ref >60)
Glucose, Bld: 81 mg/dL (ref 70–99)
Potassium: 3.5 mmol/L (ref 3.5–5.1)
Sodium: 140 mmol/L (ref 135–145)
Total Bilirubin: 0.4 mg/dL (ref 0.3–1.2)
Total Protein: 7.4 g/dL (ref 6.5–8.1)

## 2021-11-25 LAB — CBC
HCT: 40.9 % (ref 36.0–46.0)
Hemoglobin: 13.1 g/dL (ref 12.0–15.0)
MCH: 26.9 pg (ref 26.0–34.0)
MCHC: 32 g/dL (ref 30.0–36.0)
MCV: 84 fL (ref 80.0–100.0)
Platelets: 376 10*3/uL (ref 150–400)
RBC: 4.87 MIL/uL (ref 3.87–5.11)
RDW: 14.5 % (ref 11.5–15.5)
WBC: 10 10*3/uL (ref 4.0–10.5)
nRBC: 0 % (ref 0.0–0.2)

## 2021-11-25 LAB — LIPASE, BLOOD: Lipase: 19 U/L (ref 11–51)

## 2021-11-25 LAB — PREGNANCY, URINE: Preg Test, Ur: NEGATIVE

## 2021-11-25 IMAGING — CT CT ABD-PELV W/ CM
2 of 5 series · 16 of 46 positions shown, 18 images · IV contrast (APPLIED)
Comparison: CT abdomen and pelvis [DATE]

CLINICAL DATA: Acute abdominal pain.

EXAM:
CT ABDOMEN AND PELVIS WITH CONTRAST
TECHNIQUE: Multidetector CT imaging of the abdomen and pelvis was performed
using the standard protocol following bolus administration of
intravenous contrast.

[Series 2: abd pel w · axial · 0.96mm/px · z∈[+748,+1218]mm · 13 of 108 slices shown, 15 images]
[im 7/108  soft-tissue]
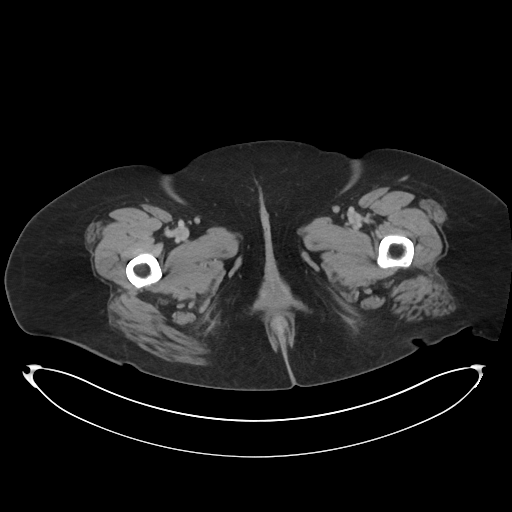
[im 7/108  bone]
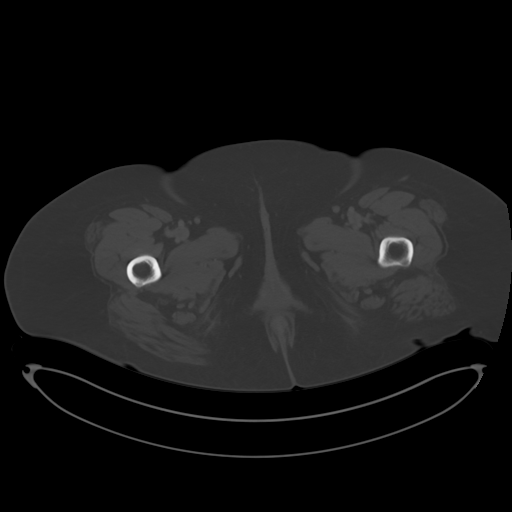
[im 13/108  soft-tissue]
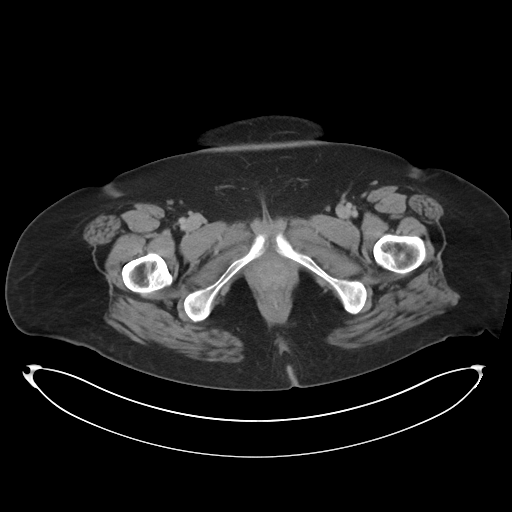
[im 26/108  soft-tissue]
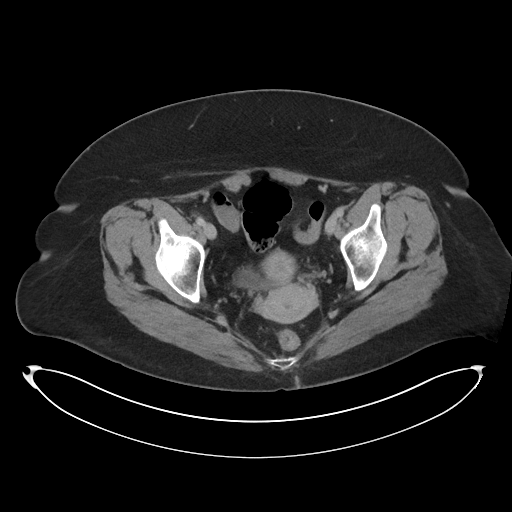
[im 32/108  soft-tissue]
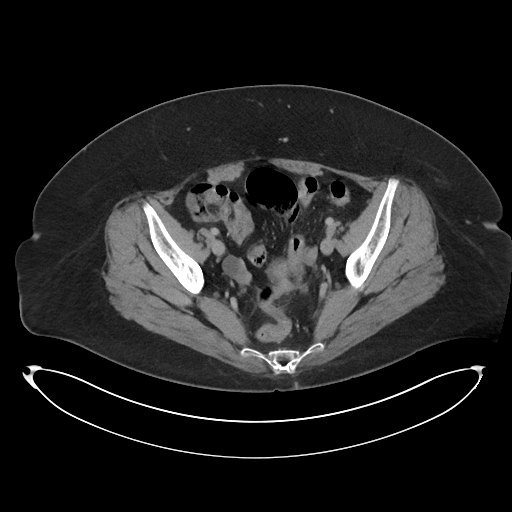
[im 38/108  soft-tissue]
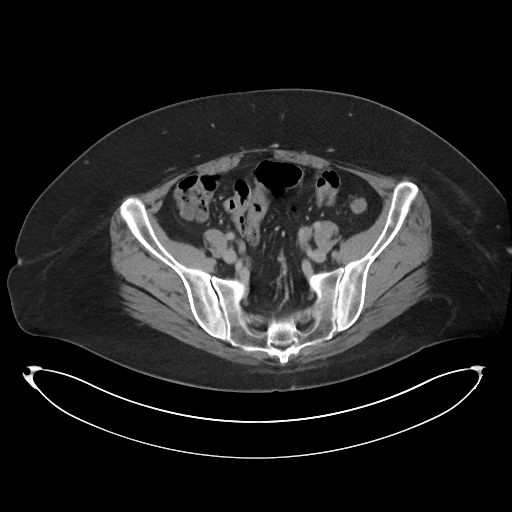
[im 45/108  soft-tissue]
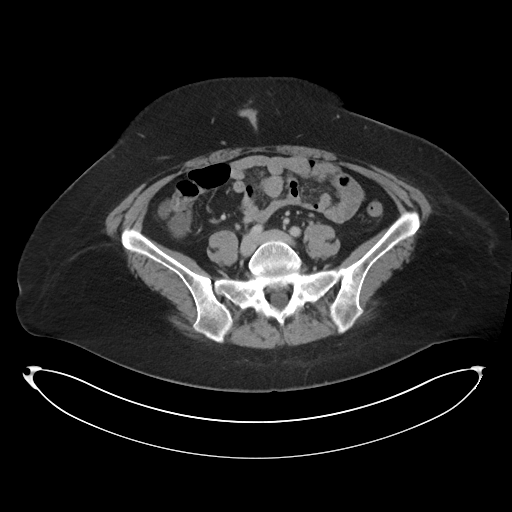
[im 57/108  soft-tissue]
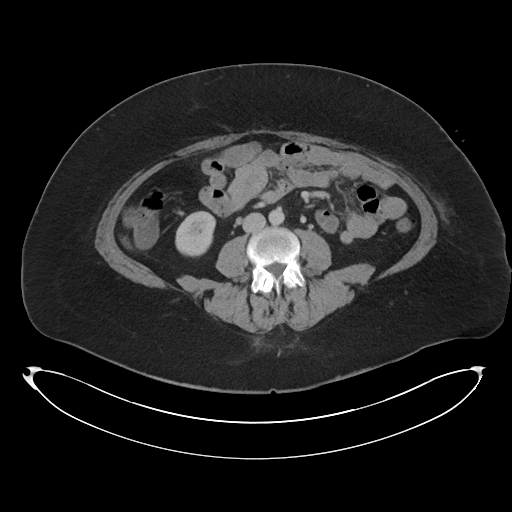
[im 63/108  soft-tissue]
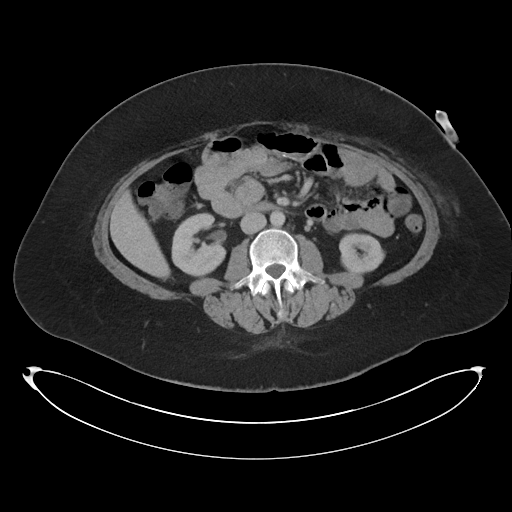
[im 70/108  soft-tissue]
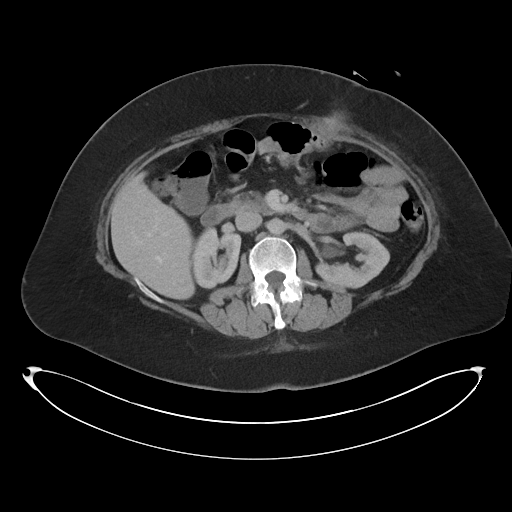
[im 70/108  bone]
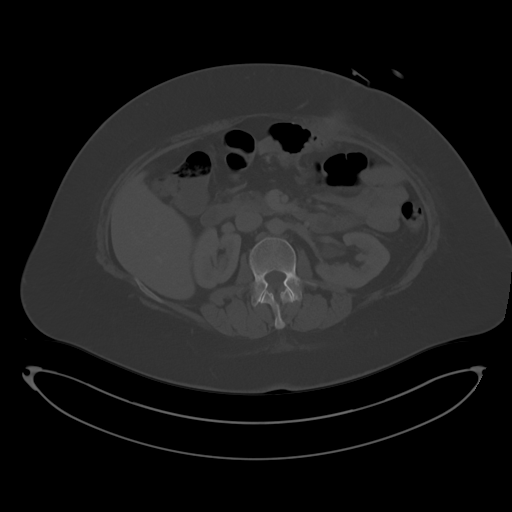
[im 76/108  soft-tissue]
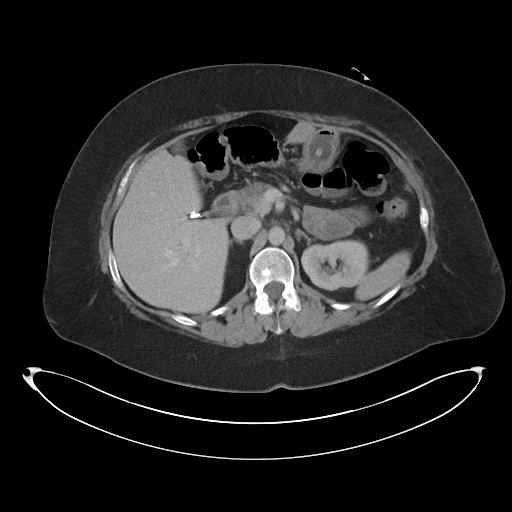
[im 82/108  soft-tissue]
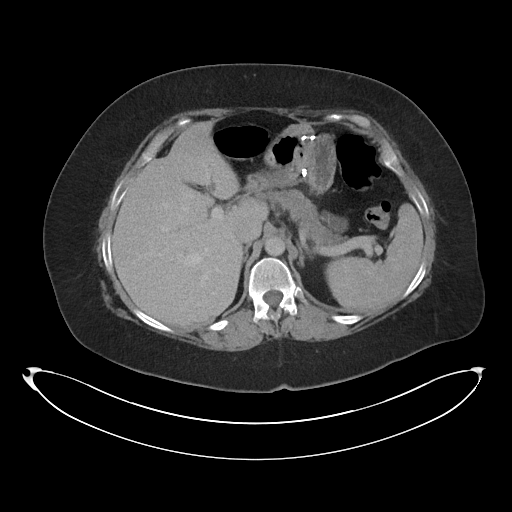
[im 95/108  soft-tissue]
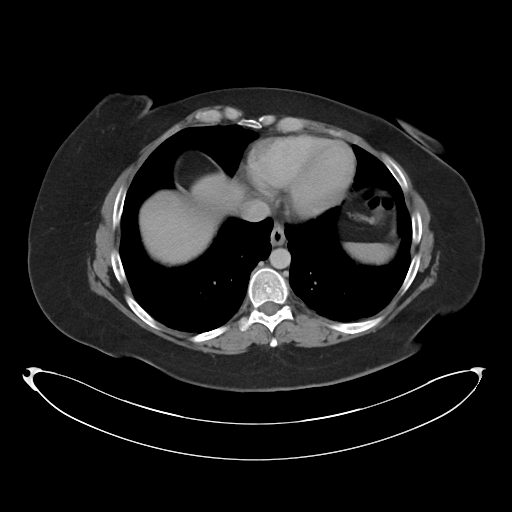
[im 101/108  soft-tissue]
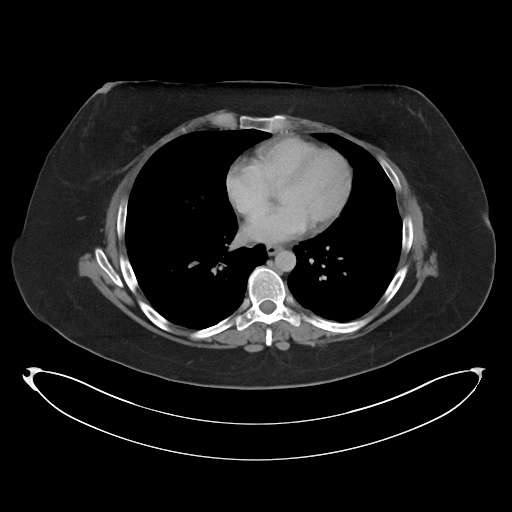

[Series 5: coronal · coronal · 0.96mm/px · 3 of 110 slices shown]
[im 37/110  soft-tissue]
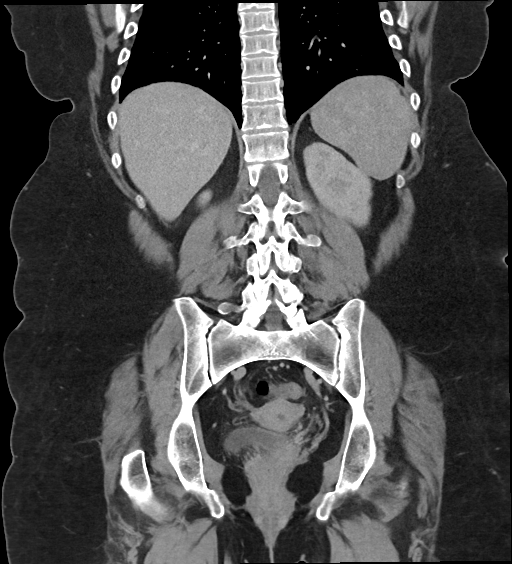
[im 49/110  soft-tissue]
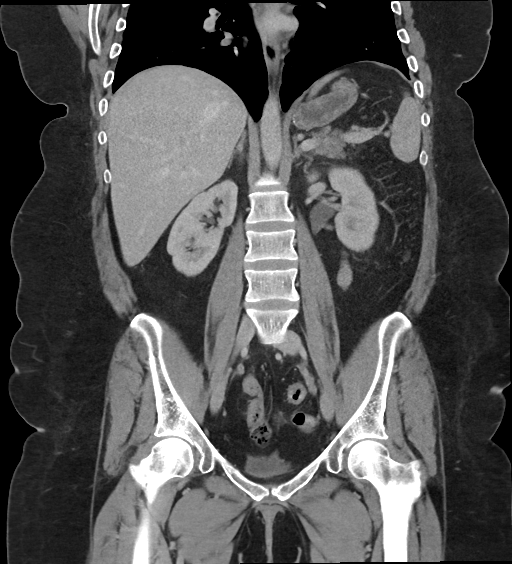
[im 61/110  soft-tissue]
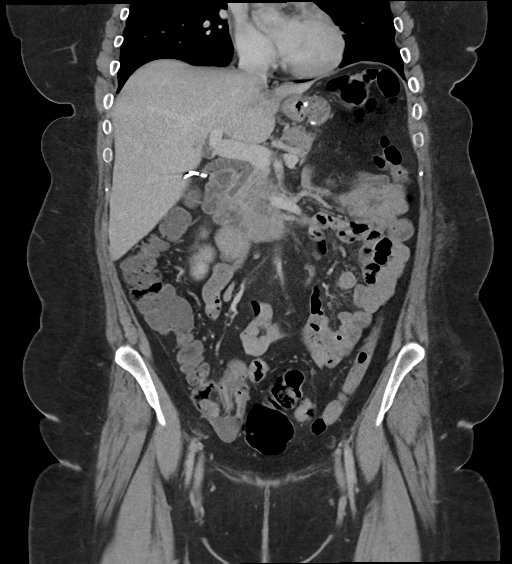

[16 of 46 positions shown; findings below may reference images not displayed]

RADIATION DOSE REDUCTION: This exam was performed according to the
departmental dose-optimization program which includes automated
exposure control, adjustment of the mA and/or kV according to
patient size and/or use of iterative reconstruction technique.

CONTRAST:  100mL OMNIPAQUE IOHEXOL 300 MG/ML  SOLN
FINDINGS: Lower chest: No acute abnormality.

Hepatobiliary: No focal liver abnormality is seen. Status post
cholecystectomy. No biliary dilatation.

Pancreas: Unremarkable. No pancreatic ductal dilatation or
surrounding inflammatory changes.

Spleen: Normal in size without focal abnormality.

Adrenals/Urinary Tract: Adrenal glands are unremarkable. Kidneys are
normal, without renal calculi, focal lesion, or hydronephrosis.
Bladder is unremarkable.

Stomach/Bowel: Patient is status post gastric bypass. The stomach
appears nondilated. Gastrostomy tube is in the body of the stomach.
No focal wall inflammation or pneumatosis. Small bowel, colon and
appendix are within normal limits.

Vascular/Lymphatic: No significant vascular findings are present. No
enlarged abdominal or pelvic lymph nodes.

Reproductive: Uterus and bilateral adnexa are unremarkable.

Other: There is mild abdominal wall intramuscular thickening and
inflammatory stranding at the site of the PEG tube. No evidence for
soft tissue gas or fluid collection. No focal abdominal wall hernia
or free fluid.

Musculoskeletal: No acute or significant osseous findings.
IMPRESSION: 1. Percutaneous gastrostomy tube in place in the body of the
stomach. There is some intramuscular wall thickening and
inflammation at the site of the PEG tube without fluid collection or
soft tissue gas. Correlate clinically for infection/inflammation at
this level.

2.  Gastric bypass changes.

3.  No evidence for bowel obstruction or focal bowel inflammation.

## 2021-11-25 MED ORDER — FAMOTIDINE IN NACL 20-0.9 MG/50ML-% IV SOLN
20.0000 mg | Freq: Once | INTRAVENOUS | Status: AC
  Administered 2021-11-25: 20 mg via INTRAVENOUS
  Filled 2021-11-25: qty 50

## 2021-11-25 MED ORDER — ONDANSETRON 4 MG PO TBDP
4.0000 mg | ORAL_TABLET | Freq: Once | ORAL | Status: AC | PRN
  Administered 2021-11-25: 4 mg via ORAL
  Filled 2021-11-25: qty 1

## 2021-11-25 MED ORDER — PROCHLORPERAZINE EDISYLATE 10 MG/2ML IJ SOLN
10.0000 mg | Freq: Once | INTRAMUSCULAR | Status: AC
  Administered 2021-11-25: 10 mg via INTRAVENOUS
  Filled 2021-11-25: qty 2

## 2021-11-25 MED ORDER — MORPHINE SULFATE (PF) 4 MG/ML IV SOLN
4.0000 mg | Freq: Once | INTRAVENOUS | Status: AC
  Administered 2021-11-25: 4 mg via INTRAVENOUS
  Filled 2021-11-25: qty 1

## 2021-11-25 MED ORDER — SODIUM CHLORIDE 0.9 % IV BOLUS
1000.0000 mL | Freq: Once | INTRAVENOUS | Status: AC
  Administered 2021-11-25: 1000 mL via INTRAVENOUS

## 2021-11-25 MED ORDER — IOHEXOL 300 MG/ML  SOLN
100.0000 mL | Freq: Once | INTRAMUSCULAR | Status: AC | PRN
  Administered 2021-11-25: 100 mL via INTRAVENOUS

## 2021-11-25 MED ORDER — METOCLOPRAMIDE HCL 10 MG PO TABS: 10.0000 mg | ORAL_TABLET | Freq: Four times a day (QID) | ORAL | 0 refills | Status: DC | PRN

## 2021-11-25 NOTE — ED Triage Notes (Signed)
"  I have a g tube for hydration due to chronic nausea, but I have had n/v/d x 2 days and now when I try to use my g tube it hurts" per pt

## 2021-11-25 NOTE — ED Provider Notes (Signed)
[facility] EMERGENCY DEPT Provider Note   CSN: [id number] Arrival date & time: 11/25/21  1720     History  Chief Complaint  Patient presents with   Emesis    [name] [name] is a 36 y.o. female.  Pt is a 36 yo with a pmhx significant for depression, anxiety, anemia, GERD, PCOS, morbid obesity, hypothyroidism, bipolar d/o, htn, add, and kidney stones.  Pt had a hiatal hernia repair on 5/5.  She also had a g-tube placed at the same time to help with hydration.  Pt said she was supposed to get the g-tube removed this week, but her doctor did not an appointment available.  It is scheduled for removal on 6/20.  Pt said she has been having vomiting today despite her lithium.  Pt has tried to use her g-tube, but it is hurting a lot, so she has not used it.  She denies f/c.  She does have pain, but only around her g-tube site.       Home Medications Prior to Admission medications   Medication Sig Start Date End Date Taking? Authorizing Provider  acetaminophen (TYLENOL) 500 MG tablet Take 2 tablets (1,000 mg total) by mouth every 6 (six) hours as needed for moderate pain. 02/13/19   Fayrene Helper, PA-C  busPIRone (BUSPAR) 15 MG tablet Take 15 mg by mouth 3 (three) times daily. 11/01/19   [provider]  clotrimazole (MYCELEX) 10 MG troche Take 10 mg by mouth daily as needed (Mouth Rash). 09/24/20   [provider]  cyanocobalamin (,VITAMIN B-12,) 1000 MCG/ML injection Inject 1 vial per week for 3 weeks, then 1 vial per month thereafter 06/21/20   Willow Ora, MD  famotidine (PEPCID) 20 MG tablet Take 1 tablet (20 mg total) by mouth 2 (two) times daily. 10/27/21   Arby Barrette, MD  HYDROcodone-acetaminophen (NORCO/VICODIN) 5-325 MG tablet Take 1 tablet by mouth every 6 (six) hours as needed for Pain for up to 5 days 11/06/21     hydrOXYzine (VISTARIL) 50 MG capsule Take 50 mg by mouth in the morning, at noon, in the evening, and at bedtime. 12/26/20   [provider]  lamoTRIgine (LAMICTAL) 150 MG tablet Take 300 mg by mouth in the morning. 04/10/20   [provider]  lithium carbonate (LITHOBID) 300 MG CR tablet Take 300 mg by mouth at bedtime.    [provider]  Magnesium 500 MG TABS Take 500 mg by mouth daily.    [provider]  metFORMIN (GLUCOPHAGE) 500 MG tablet Take 500 mg by mouth 2 (two) times daily. 11/01/19   [provider]  methocarbamol (ROBAXIN) 500 MG tablet Take 1 tablet (500 mg total) by mouth every 6 (six) hours as needed for up to 8 days 11/06/21     metoCLOPramide (REGLAN) 10 MG tablet Take 1 tablet (10 mg total) by mouth every 6 (six) hours as needed for nausea. 11/25/21   Jacalyn Lefevre, MD  nortriptyline (PAMELOR) 10 MG capsule Take 1 capsule (10 mg total) by mouth at bedtime. 10/01/21   Kirsteins, Victorino Sparrow, MD  ondansetron (ZOFRAN) 4 MG tablet Take 2 tablets (8 mg total) by mouth every 8 (eight) hours as needed for nausea or vomiting. 07/21/21   Karie Soda, MD  ondansetron (ZOFRAN-ODT) 4 MG disintegrating tablet Take 4 mg by mouth every 8 (eight) hours as needed for nausea or vomiting.    [provider]  oxyCODONE (OXY IR/ROXICODONE) 5 MG immediate release tablet Take  1-2 tablets (5-10 mg total) by mouth every 6 (six) hours as needed for moderate pain. 10/20/21   Romie Leveehomas, Alicia, MD  oxyCODONE (OXY IR/ROXICODONE) 5 MG immediate release tablet Take 1 tablet (5 mg total) by mouth every 6 (six) hours as needed for Pain 10/22/21     oxyCODONE (ROXICODONE) 5 MG immediate release tablet Take 1 tablet (5 mg total) by mouth every 4 (four) hours as needed for severe pain. 10/27/21   Arby BarrettePfeiffer, Marcy, MD  pantoprazole (PROTONIX) 20 MG tablet TAKE 1 TABLET BY MOUTH EVERY DAY Patient taking differently: 20 mg 2 (two) times daily. 06/21/21   Willow OraAndy, Camille L, MD  promethazine (PHENERGAN) 25 MG suppository Place 1 suppository (25 mg total) rectally every 6 (six) hours as needed for nausea. 07/21/21    Karie SodaGross, Steven, MD  promethazine (PHENERGAN) 6.25 MG/5ML syrup Take 10 mLs (12.5 mg total) by mouth every 6 (six) hours as needed for Nausea for up to 14 days 07/21/21   Karie SodaGross, Steven, MD  QUEtiapine (SEROQUEL) 400 MG tablet Take 400 mg by mouth at bedtime. Take with (2)  50 mg for a total of 500 mg in bedtime 08/24/19   [provider]  QUEtiapine (SEROQUEL) 50 MG tablet Take 100 mg by mouth at bedtime. Take with 400  mg for a total of 500 mg 06/27/20   [provider]  SYRINGE-NEEDLE, DISP, 3 ML 25G X 1" 3 ML MISC Use 1 needle per application once weekly 06/21/20   Willow OraAndy, Camille L, MD  tizanidine (ZANAFLEX) 2 MG capsule Take 4 mg by mouth 3 (three) times daily as needed for muscle spasms. 09/10/21   [provider]  valACYclovir (VALTREX) 1000 MG tablet Take 1,000 mg by mouth daily as needed (Cold sores).    [provider]      Allergies    Nsaids    Review of Systems   Review of Systems  Gastrointestinal:  Positive for abdominal pain, nausea and vomiting.  All other systems reviewed and are negative.   Physical Exam Updated Vital Signs BP 124/74 (BP Location: Right Arm)   Pulse 96   Temp 98.4 F (36.9 C) (Oral)   Resp 16   Ht 5\' 4"  (1.626 m)   Wt 109.3 kg   LMP 10/13/2021 (Approximate) Comment: neg preg test  SpO2 100%   BMI 41.37 kg/m  Physical Exam Vitals and nursing note reviewed.  Constitutional:      Appearance: Normal appearance. She is obese.  HENT:     Head: Normocephalic and atraumatic.     Right Ear: External ear normal.     Left Ear: External ear normal.     Nose: Nose normal.     Mouth/Throat:     Mouth: Mucous membranes are moist.     Pharynx: Oropharynx is clear.  Eyes:     Extraocular Movements: Extraocular movements intact.     Conjunctiva/sclera: Conjunctivae normal.     Pupils: Pupils are equal, round, and reactive to light.  Cardiovascular:     Rate and Rhythm: Normal rate and regular rhythm.     Pulses: Normal  pulses.     Heart sounds: Normal heart sounds.  Pulmonary:     Effort: Pulmonary effort is normal.     Breath sounds: Normal breath sounds.  Abdominal:     General: Abdomen is flat. Bowel sounds are normal.     Palpations: Abdomen is soft.     Comments: G-tube site with some localized irritation.  Musculoskeletal:  General: Normal range of motion.     Cervical back: Normal range of motion and neck supple.  Skin:    General: Skin is warm.     Capillary Refill: Capillary refill takes less than 2 seconds.  Neurological:     General: No focal deficit present.     Mental Status: She is alert and oriented to person, place, and time.  Psychiatric:        Mood and Affect: Mood normal.        Behavior: Behavior normal.     ED Results / Procedures / Treatments   Labs (all labs ordered are listed, but only abnormal results are displayed) Labs Reviewed  COMPREHENSIVE METABOLIC PANEL - Abnormal; Notable for the following components:      Result Value   AST 10 (*)    Alkaline Phosphatase 173 (*)    All other components within normal limits  URINALYSIS, ROUTINE W REFLEX MICROSCOPIC - Abnormal; Notable for the following components:   Color, Urine COLORLESS (*)    Specific Gravity, Urine <1.005 (*)    All other components within normal limits  LIPASE, BLOOD  CBC  PREGNANCY, URINE    EKG None  Radiology CT ABDOMEN PELVIS W CONTRAST  Result Date: 11/25/2021 CLINICAL DATA:  Acute abdominal pain. EXAM: CT ABDOMEN AND PELVIS WITH CONTRAST TECHNIQUE: Multidetector CT imaging of the abdomen and pelvis was performed using the standard protocol following bolus administration of intravenous contrast. RADIATION DOSE REDUCTION: This exam was performed according to the departmental dose-optimization program which includes automated exposure control, adjustment of the mA and/or kV according to patient size and/or use of iterative reconstruction technique. CONTRAST:  OMNIPAQUE IOHEXOL 300  MG/ML  SOLN COMPARISON:  CT abdomen and pelvis 10/27/2021 FINDINGS: Lower chest: No acute abnormality. Hepatobiliary: No focal liver abnormality is seen. Status post cholecystectomy. No biliary dilatation. Pancreas: Unremarkable. No pancreatic ductal dilatation or surrounding inflammatory changes. Spleen: Normal in size without focal abnormality. Adrenals/Urinary Tract: Adrenal glands are unremarkable. Kidneys are normal, without renal calculi, focal lesion, or hydronephrosis. Bladder is unremarkable. Stomach/Bowel: Patient is status post gastric bypass. The stomach appears nondilated. Gastrostomy tube is in the body of the stomach. No focal wall inflammation or pneumatosis. Small bowel, colon and appendix are within normal limits. Vascular/Lymphatic: No significant vascular findings are present. No enlarged abdominal or pelvic lymph nodes. Reproductive: Uterus and bilateral adnexa are unremarkable. Other: There is mild abdominal wall intramuscular thickening and inflammatory stranding at the site of the PEG tube. No evidence for soft tissue gas or fluid collection. No focal abdominal wall hernia or free fluid. Musculoskeletal: No acute or significant osseous findings. IMPRESSION: 1. Percutaneous gastrostomy tube in place in the body of the stomach. There is some intramuscular wall thickening and inflammation at the site of the PEG tube without fluid collection or soft tissue gas. Correlate clinically for infection/inflammation at this level. 2.  Gastric bypass changes. 3.  No evidence for bowel obstruction or focal bowel inflammation. Electronically Signed   By: Darliss Cheney M.D.   On: 11/25/2021 19:52    Procedures FEEDING TUBE REPLACEMENT  Date/Time: 11/25/2021 9:38 PM  Performed by: Jacalyn Lefevre, MD Authorized by: Jacalyn Lefevre, MD  Consent: Verbal consent obtained. Preparation: Patient was prepped and draped in the usual sterile fashion. Tube type: gastrostomy Patient position: supine Bulb  inflation volume: 10 (ml) Patient tolerance: patient tolerated the procedure well with no immediate complications Comments: Pt requests G-tube removal.  Tube removed without difficulty.  Medications Ordered in ED Medications  ondansetron (ZOFRAN-ODT) disintegrating tablet 4 mg (4 mg Oral Given 11/25/21 1758)  sodium chloride 0.9 % bolus 1,000 mL (0 mLs Intravenous Stopped 11/25/21 2110)  prochlorperazine (COMPAZINE) injection 10 mg (10 mg Intravenous Given 11/25/21 1945)  famotidine (PEPCID) IVPB 20 mg premix (0 mg Intravenous Stopped 11/25/21 2110)  iohexol (OMNIPAQUE) 300 MG/ML solution 100 mL (100 mLs Intravenous Contrast Given 11/25/21 1926)  morphine (PF) 4 MG/ML injection 4 mg (4 mg Intravenous Given 11/25/21 2028)    ED Course/ Medical Decision Making/ A&P                           Medical Decision Making Amount and/or Complexity of Data Reviewed Labs: ordered. Radiology: ordered.  Risk Prescription drug management.   This patient presents to the ED for concern of abd pain, this involves an extensive number of treatment options, and is a complaint that carries with it a high risk of complications and morbidity.  The differential diagnosis includes gastric outlet obstruction, infection, electrolyte abn.   Co morbidities that complicate the patient evaluation  depression, anxiety, anemia, GERD, PCOS, morbid obesity, hypothyroidism, bipolar d/o, htn, add, and kidney stones   Additional history obtained:  Additional history obtained from epic chart review External records from outside source obtained and reviewed including husband   Lab Tests:  I Ordered, and personally interpreted labs.  The pertinent results include:  cmp nl, preg nl, ua nl, cbc nl   Imaging Studies ordered:  I ordered imaging studies including ct abd/pelvis  I independently visualized and interpreted imaging which showed  IMPRESSION:  1. Percutaneous gastrostomy tube in place in the body of  the  stomach. There is some intramuscular wall thickening and  inflammation at the site of the PEG tube without fluid collection or  soft tissue gas. Correlate clinically for infection/inflammation at  this level.    2.  Gastric bypass changes.    3.  No evidence for bowel obstruction or focal bowel inflammation.   I agree with the radiologist interpretation   Cardiac Monitoring:  The patient was maintained on a cardiac monitor.  I personally viewed and interpreted the cardiac monitored which showed an underlying rhythm of: nsr   Medicines ordered and prescription drug management:  I ordered medication including IVFs, compazine, and morphine  for dehydration, nausea and pain  Reevaluation of the patient after these medicines showed that the patient improved I have reviewed the patients home medicines and have made adjustments as needed   Test Considered:  Ct abd/pelvis   Critical Interventions:  Iv hydration and nausea meds   Consultations Obtained:  I requested consultation with the surgeon (Dr. Bedelia Person),  and discussed lab and imaging findings as well as pertinent plan - she said I could remove the tube.   Problem List / ED Course:  N/v:  resolved after IVFs and IV compazine.  HR is now back to nl.  She is able to drink apple juice without any problems.  She requests a rx for reglan. G tube:  pt said it is causing her a lot of pain and requests that we remove it as it is due to come out this week.  Pt d/w Dr. Bedelia Person and she said it was ok to remove.  Tube removed without problems.  Dressing applied.   Reevaluation:  After the interventions noted above, I reevaluated the patient and found that they have :improved   Social  Determinants of Health:  Lives at home   Dispostion:  After consideration of the diagnostic results and the patients response to treatment, I feel that the patent would benefit from discharge with outpatient f/u.          Final  Clinical Impression(s) / ED Diagnoses Final diagnoses:  Nausea and vomiting, unspecified vomiting type  Pain from gastrostomy tube [facility])    Rx / DC Orders ED Discharge Orders          Ordered    metoCLOPramide (REGLAN) 10 MG tablet  Every 6 hours PRN        11/25/21 2127              Jacalyn Lefevre, MD 11/25/21 2139

## 2021-12-10 ENCOUNTER — Other Ambulatory Visit: Payer: Self-pay | Admitting: Family Medicine

## 2021-12-10 DIAGNOSIS — G4762 Sleep related leg cramps: Secondary | ICD-10-CM

## 2021-12-13 ENCOUNTER — Other Ambulatory Visit (HOSPITAL_BASED_OUTPATIENT_CLINIC_OR_DEPARTMENT_OTHER): Payer: Self-pay

## 2021-12-13 ENCOUNTER — Encounter: Payer: Self-pay | Admitting: Physician Assistant

## 2021-12-13 ENCOUNTER — Ambulatory Visit (INDEPENDENT_AMBULATORY_CARE_PROVIDER_SITE_OTHER): Payer: 59 | Admitting: Physician Assistant

## 2021-12-13 VITALS — BP 132/80 | HR 102 | Temp 98.5°F | Ht 64.0 in | Wt 243.6 lb

## 2021-12-13 DIAGNOSIS — R109 Unspecified abdominal pain: Secondary | ICD-10-CM

## 2021-12-13 DIAGNOSIS — R310 Gross hematuria: Secondary | ICD-10-CM

## 2021-12-13 DIAGNOSIS — N200 Calculus of kidney: Secondary | ICD-10-CM | POA: Diagnosis not present

## 2021-12-13 LAB — POCT URINALYSIS DIPSTICK
Bilirubin, UA: POSITIVE
Blood, UA: POSITIVE
Glucose, UA: NEGATIVE
Ketones, UA: POSITIVE
Nitrite, UA: NEGATIVE
Protein, UA: POSITIVE — AB
Spec Grav, UA: >=1.03 — AB (ref 1.010–1.025)
Urobilinogen, UA: 0.2 E.U./dL
pH, UA: 5 (ref 5.0–8.0)

## 2021-12-13 MED ORDER — CEPHALEXIN 500 MG PO CAPS
500.0000 mg | ORAL_CAPSULE | Freq: Three times a day (TID) | ORAL | 0 refills | Status: AC
  Filled 2021-12-13: qty 21, 7d supply, fill #0

## 2021-12-13 MED ORDER — HYDROCODONE-ACETAMINOPHEN 5-325 MG PO TABS
1.0000 | ORAL_TABLET | Freq: Four times a day (QID) | ORAL | 0 refills | Status: DC | PRN
  Filled 2021-12-13: qty 20, 5d supply, fill #0

## 2021-12-13 MED ORDER — TAMSULOSIN HCL 0.4 MG PO CAPS
0.4000 mg | ORAL_CAPSULE | Freq: Every day | ORAL | 2 refills | Status: DC
  Filled 2021-12-13: qty 30, 30d supply, fill #0

## 2021-12-13 MED ORDER — KETOROLAC TROMETHAMINE 60 MG/2ML IM SOLN
60.0000 mg | Freq: Once | INTRAMUSCULAR | Status: AC
  Administered 2021-12-13: 60 mg via INTRAMUSCULAR

## 2021-12-13 NOTE — Progress Notes (Signed)
Subjective:    Patient ID: [name] [name], female    DOB: [birth year], 36 y.o.   MRN: [id number]  Chief Complaint  Patient presents with   Flank Pain    x2days   Dysuria    Flank Pain Associated symptoms include dysuria.  Dysuria  Associated symptoms include flank pain.   Patient is in today for possible kidney stone x 2 days. Hx of nephrolithiasis (at least 4; one needed lithotripsy). Left flank intermittent sharp pain & suprapubic pain; hematuria. Nausea and vomiting. No fever or chills. No SOB or CP. No dizziness. Couldn't sleep last night 2/2 pain. Ibuprofen and Tylenol not helping. Dysuria during and after urinating. Some frequency. Little appetite. Does not want to go to ED or have more imaging due to numerous hx of CT scans / concern for radiation.   Past Medical History:  Diagnosis Date   ADD (attention deficit disorder)    Anemia    Anxiety    Arthritis    B12 deficiency    Back pain    Bilateral swelling of feet    Bipolar 2 disorder (HCC) 09/19/2019   Constipation    DEPRESSION    Elevated cholesterol    Family history of colon cancer June 15, 2019   Father, 33s; deceased.    Fatty liver    Fibromyalgia    Gallbladder problem    GERD (gastroesophageal reflux disease)    Heartburn    History of kidney stones    History of stomach ulcers    Hypertension    Hypothyroidism    Hx of, normalized TSH during pregnancy 2011   Infertility, female    Lactose intolerance    Migraines    Morbid obesity (HCC)    s/p RY 08/2012 - start weight 290 pounds   Palpitations    Panic disorder    PCOS (polycystic ovarian syndrome)    Pneumonia    PONV (postoperative nausea and vomiting)    Pregnancy induced hypertension    Shortness of breath    Vitamin D deficiency     Past Surgical History:  Procedure Laterality Date   Quintella Reichert OSTEOTOMY Left 06/24/2017   Procedure: Quintella Reichert OSTEOTOMY;  Surgeon: [name], DPM;  Location: [facility];  Service:  Podiatry;  Laterality: Left;   BIOPSY  07/04/2021   Procedure: BIOPSY;  Surgeon: Rachael Fee, MD;  Location: [facility];  Service: Endoscopy;;   BUNIONECTOMY Left 06/24/2017   Procedure: Anthoney Harada;  Surgeon: [name], DPM;  Location: [facility];  Service: Podiatry;  Laterality: Left;   CESAREAN SECTION     x 2   CHOLECYSTECTOMY N/A 02/15/2019   Procedure: LAPAROSCOPIC CHOLECYSTECTOMY WITH INTRAOPERATIVE CHOLANGIOGRAM;  Surgeon: Andria Meuse, MD;  Location: [facility];  Service: General;  Laterality: N/A;   COLON RESECTION N/A 05/04/2020   Procedure: DIAGNOSTIC LAPAROSCOPY, RESECTION OF CANDYCANE, UPPER ENDOSCOPY;  Surgeon: Berna Bue, MD;  Location: [facility];  Service: General;  Laterality: N/A;   ESOPHAGOGASTRODUODENOSCOPY (EGD) WITH PROPOFOL N/A 07/04/2021   Procedure: ESOPHAGOGASTRODUODENOSCOPY (EGD) WITH PROPOFOL;  Surgeon: Rachael Fee, MD;  Location: [facility];  Service: Endoscopy;  Laterality: N/A;   FOOT SURGERY     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   Procedure: LAPAROSCOPIC ROUX-EN-Y GASTRIC;  Surgeon: Atilano Ina, MD;  Location: [facility];  Service: General;  Laterality: N/A;  laparoscopic roux-en-y gastric bypass   LITHOTRIPSY Left    TONSILLECTOMY     TUBAL LIGATION  UPPER GI ENDOSCOPY  08/24/2012   Procedure: UPPER GI ENDOSCOPY;  Surgeon: Atilano Ina, MD;  Location: [facility];  Service: General;;   WISDOM TOOTH EXTRACTION     XI ROBOTIC ASSISTED HIATAL HERNIA REPAIR N/A 10/18/2021   Procedure: XI ROBOTIC ASSISTED HIATAL HERNIA REPAIR WITH MESH UPPER ENDOSCOPY; ROBOTIC ASSISTED GASTROSTOMY TUBE PLACEMENT;  Surgeon: Gaynelle Adu, MD;  Location: [facility];  Service: General;  Laterality: N/A;    Family History  Problem Relation Age of Onset   Hyperlipidemia Father    Hypertension Father    Kidney disease Father    Colon cancer Father 85   Cancer Father    Depression Father    Anxiety disorder Father    Bipolar disorder Father     Sleep apnea Father    Obesity Father    Hypertension Mother    Depression Mother    Anxiety disorder Mother    Obesity Mother    Hypertension Maternal Grandmother    Uterine cancer Maternal Grandmother 36   Diabetes Maternal Grandfather     Social History   Tobacco Use   Smoking status: Never    Passive exposure: Never   Smokeless tobacco: Never  Vaping Use   Vaping Use: Never used  Substance Use Topics   Alcohol use: Not Currently    Comment: rare   Drug use: No     Allergies  Allergen Reactions   Nsaids Other (See Comments)    Gastric bypass "    Review of Systems  Genitourinary:  Positive for dysuria and flank pain.   NEGATIVE UNLESS OTHERWISE INDICATED IN HPI      Objective:     BP 132/80   Pulse (!) 102   Temp 98.5 F (36.9 C) (Temporal)   Ht 5\' 4"  (1.626 m)   Wt 243 lb 9.6 oz (110.5 kg)   LMP 10/13/2021 (Approximate) Comment: neg U-preg 11/25/21  SpO2 100%   BMI 41.81 kg/m   Wt Readings from Last 3 Encounters:  12/13/21 243 lb 9.6 oz (110.5 kg)  11/25/21 241 lb (109.3 kg)  10/27/21 238 lb 1.6 oz (108 kg)    BP Readings from Last 3 Encounters:  12/13/21 132/80  11/25/21 127/68  10/27/21 (!) 143/95     Physical Exam Vitals and nursing note reviewed.  Constitutional:      General: She is not in acute distress.    Appearance: Normal appearance. She is not ill-appearing.  HENT:     Head: Normocephalic and atraumatic.  Cardiovascular:     Rate and Rhythm: Normal rate and regular rhythm.     Pulses: Normal pulses.     Heart sounds: Normal heart sounds.  Pulmonary:     Effort: Pulmonary effort is normal.     Breath sounds: Normal breath sounds.  Abdominal:     General: Abdomen is flat. Bowel sounds are normal.     Palpations: Abdomen is soft.     Tenderness: There is abdominal tenderness (suprapubic). There is left CVA tenderness (minimal). There is no right CVA tenderness.  Skin:    General: Skin is warm and dry.  Neurological:      General: No focal deficit present.     Mental Status: She is alert.  Psychiatric:        Mood and Affect: Mood normal.        Assessment & Plan:   Problem List Items Addressed This Visit       Genitourinary   Nephrolithiasis  Relevant Medications   HYDROcodone-acetaminophen (NORCO/VICODIN) 5-325 MG tablet   Other Relevant Orders   Ambulatory referral to Urology   Other Visit Diagnoses     Left flank pain    -  Primary   Relevant Medications   ketorolac (TORADOL) injection 60 mg   Other Relevant Orders   POCT Urinalysis Dipstick (Completed)   Urine Culture   Ambulatory referral to Urology   Gross hematuria       Relevant Orders   Urine Culture   Ambulatory referral to Urology        Meds ordered this encounter  Medications   ketorolac (TORADOL) injection 60 mg   HYDROcodone-acetaminophen (NORCO/VICODIN) 5-325 MG tablet    Sig: Take 1 tablet by mouth every 6 (six) hours as needed for moderate pain or severe pain.    Dispense:  20 tablet    Refill:  0    Order Specific Question:   Supervising Provider    Answer:   Shelva Majestic [[id number]]   tamsulosin (FLOMAX) 0.4 MG CAPS capsule    Sig: Take 1 capsule (0.4 mg total) by mouth daily.    Dispense:  30 capsule    Refill:  2    Order Specific Question:   Supervising Provider    Answer:   Shelva Majestic [[id number]]   cephALEXin (KEFLEX) 500 MG capsule    Sig: Take 1 capsule (500 mg total) by mouth 3 (three) times daily for 7 days.    Dispense:  21 capsule    Refill:  0    Order Specific Question:   Supervising Provider    Answer:   Shelva Majestic [[id number]]    PLAN: -Treat for likely renal stone +/- secondary UTI -Cephalexin, Flomax, Fluids; she has antiemetic at home  -Toradol in office -Vicodin - Pt aware of risks vs benefits and possible adverse reactions; no driving -Urine strainer and culture -Refer to uro -ED if acutely worse   This note was prepared with assistance of Dragon voice recognition  software. Occasional wrong-word or sound-a-like substitutions may have occurred due to the inherent limitations of voice recognition software.    Chancelor Hardrick M Vanity Larsson, PA-C

## 2021-12-13 NOTE — Patient Instructions (Addendum)
I think you have another kidney stone.  Start on Flomax, Cephalexin, FLUIDS. Vicodin if severe pain. Toradol in office today. No more NSAIDs.  Urine strainer needed.  Urine culture sent out.   Referral to urology as this has been recurrent for you.   ED if acutely worse.

## 2021-12-14 LAB — URINE CULTURE
MICRO NUMBER:: 13594176
SPECIMEN QUALITY:: ADEQUATE

## 2021-12-23 ENCOUNTER — Other Ambulatory Visit (HOSPITAL_BASED_OUTPATIENT_CLINIC_OR_DEPARTMENT_OTHER): Payer: Self-pay

## 2021-12-23 MED ORDER — CIPROFLOXACIN HCL 500 MG PO TABS
ORAL_TABLET | ORAL | 0 refills | Status: DC
  Filled 2021-12-23: qty 14, 7d supply, fill #0

## 2021-12-23 MED ORDER — ONDANSETRON 4 MG PO TBDP
ORAL_TABLET | ORAL | 0 refills | Status: DC
  Filled 2021-12-23: qty 10, 4d supply, fill #0

## 2021-12-24 ENCOUNTER — Other Ambulatory Visit (HOSPITAL_BASED_OUTPATIENT_CLINIC_OR_DEPARTMENT_OTHER): Payer: Self-pay

## 2021-12-24 ENCOUNTER — Encounter (HOSPITAL_BASED_OUTPATIENT_CLINIC_OR_DEPARTMENT_OTHER): Payer: Self-pay

## 2021-12-24 ENCOUNTER — Emergency Department (HOSPITAL_BASED_OUTPATIENT_CLINIC_OR_DEPARTMENT_OTHER)
Admission: EM | Admit: 2021-12-24 | Discharge: 2021-12-24 | Disposition: A | Discharge status: Home / Self Care | Payer: 59 | Attending: Emergency Medicine | Admitting: Emergency Medicine

## 2021-12-24 ENCOUNTER — Emergency Department (HOSPITAL_BASED_OUTPATIENT_CLINIC_OR_DEPARTMENT_OTHER): Payer: 59

## 2021-12-24 ENCOUNTER — Other Ambulatory Visit: Payer: Self-pay

## 2021-12-24 DIAGNOSIS — R112 Nausea with vomiting, unspecified: Secondary | ICD-10-CM

## 2021-12-24 DIAGNOSIS — E039 Hypothyroidism, unspecified: Secondary | ICD-10-CM | POA: Diagnosis not present

## 2021-12-24 DIAGNOSIS — R109 Unspecified abdominal pain: Secondary | ICD-10-CM

## 2021-12-24 DIAGNOSIS — Z79899 Other long term (current) drug therapy: Secondary | ICD-10-CM | POA: Diagnosis not present

## 2021-12-24 DIAGNOSIS — N12 Tubulo-interstitial nephritis, not specified as acute or chronic: Secondary | ICD-10-CM | POA: Diagnosis not present

## 2021-12-24 DIAGNOSIS — I10 Essential (primary) hypertension: Secondary | ICD-10-CM | POA: Insufficient documentation

## 2021-12-24 LAB — CBC
HCT: 37.7 % (ref 36.0–46.0)
Hemoglobin: 12.1 g/dL (ref 12.0–15.0)
MCH: 27.1 pg (ref 26.0–34.0)
MCHC: 32.1 g/dL (ref 30.0–36.0)
MCV: 84.3 fL (ref 80.0–100.0)
Platelets: 288 10*3/uL (ref 150–400)
RBC: 4.47 MIL/uL (ref 3.87–5.11)
RDW: 14.7 % (ref 11.5–15.5)
WBC: 6.2 10*3/uL (ref 4.0–10.5)
nRBC: 0 % (ref 0.0–0.2)

## 2021-12-24 LAB — URINALYSIS, ROUTINE W REFLEX MICROSCOPIC
Bilirubin Urine: NEGATIVE
Glucose, UA: NEGATIVE mg/dL
Hgb urine dipstick: NEGATIVE
Ketones, ur: NEGATIVE mg/dL
Nitrite: NEGATIVE
Specific Gravity, Urine: 1.028 (ref 1.005–1.030)
pH: 5.5 (ref 5.0–8.0)

## 2021-12-24 LAB — BASIC METABOLIC PANEL
Anion gap: 12 (ref 5–15)
BUN: 14 mg/dL (ref 6–20)
CO2: 24 mmol/L (ref 22–32)
Calcium: 9.5 mg/dL (ref 8.9–10.3)
Chloride: 107 mmol/L (ref 98–111)
Creatinine, Ser: 0.63 mg/dL (ref 0.44–1.00)
GFR, Estimated: >60 mL/min (ref >60)
Glucose, Bld: 87 mg/dL (ref 70–99)
Potassium: 3.5 mmol/L (ref 3.5–5.1)
Sodium: 143 mmol/L (ref 135–145)

## 2021-12-24 LAB — PREGNANCY, URINE: Preg Test, Ur: NEGATIVE

## 2021-12-24 MED ORDER — SODIUM CHLORIDE 0.9 % IV SOLN
25.0000 mg | Freq: Once | INTRAVENOUS | Status: DC
  Filled 2021-12-24: qty 1

## 2021-12-24 MED ORDER — HYDROMORPHONE HCL 1 MG/ML IJ SOLN
1.0000 mg | Freq: Once | INTRAMUSCULAR | Status: AC
  Administered 2021-12-24: 1 mg via INTRAVENOUS
  Filled 2021-12-24: qty 1

## 2021-12-24 MED ORDER — ONDANSETRON HCL 4 MG/2ML IJ SOLN
4.0000 mg | Freq: Once | INTRAMUSCULAR | Status: AC
  Administered 2021-12-24: 4 mg via INTRAVENOUS
  Filled 2021-12-24: qty 2

## 2021-12-24 MED ORDER — PROMETHAZINE HCL 25 MG PO TABS
25.0000 mg | ORAL_TABLET | Freq: Four times a day (QID) | ORAL | 0 refills | Status: DC | PRN
  Filled 2021-12-24: qty 21, 6d supply, fill #0

## 2021-12-24 MED ORDER — OXYCODONE-ACETAMINOPHEN 5-325 MG PO TABS
1.0000 | ORAL_TABLET | ORAL | 0 refills | Status: DC | PRN
  Filled 2021-12-24: qty 10, 2d supply, fill #0

## 2021-12-24 MED ORDER — PROMETHAZINE HCL 25 MG/ML IJ SOLN
INTRAMUSCULAR | Status: AC
  Administered 2021-12-24: 25 mg via INTRAVENOUS
  Filled 2021-12-24: qty 1

## 2021-12-24 MED ORDER — OXYCODONE-ACETAMINOPHEN 5-325 MG PO TABS
1.0000 | ORAL_TABLET | Freq: Once | ORAL | Status: AC
  Administered 2021-12-24: 1 via ORAL
  Filled 2021-12-24: qty 1

## 2021-12-24 NOTE — Discharge Instructions (Signed)
Your history, exam, work-up today are consistent with the urinary tract infection now being a pyelonephritis causing the left flank pain.  The CT scan we did did not show any evidence of acute kidney stone or infected stone requiring intervention.  Please use the pain medicine and nausea medicine and pick up the prescription for the antibiotics you are prescribed yesterday to treat the infection.  Please rest and stay hydrated.  If any symptoms change or worsen acutely, please return to the nearest emergency department.

## 2021-12-24 NOTE — ED Notes (Signed)
ED Provider at bedside. 

## 2021-12-24 NOTE — ED Notes (Signed)
Patient transported to CT 

## 2021-12-24 NOTE — ED Triage Notes (Signed)
Pt presents POV with Left flank pain radiating to her LLQ region, N/V. Pt reports symptoms have been ongoing since 6/27. Seen at [facility] yesterday, pt originally stated, "They started an IV, gave me fluids and IV ABX, drew my blood and found an infection and was concerned for a kidney stone." Pt then states, "they didn't draw my blood."   Pt reports burning with urination

## 2021-12-24 NOTE — ED Provider Notes (Signed)
[facility]   CSN: [id number] Arrival date & time: 12/24/21  0818     History  Chief Complaint  Patient presents with   Flank Pain    [name] DixonSamantha D [name] is a 36 y.o. female.  The history is provided by the patient and medical records. No language interpreter was used.  Flank Pain This is a recurrent problem. The current episode started more than 1 week ago. The problem occurs constantly. The problem has been gradually worsening. Pertinent negatives include no chest pain, no abdominal pain, no headaches and no shortness of breath. Nothing aggravates the symptoms. Nothing relieves the symptoms. She has tried nothing for the symptoms. The treatment provided no relief.       Home Medications Prior to Admission medications   Medication Sig Start Date End Date Taking? Authorizing Provider  acetaminophen (TYLENOL) 500 MG tablet Take 2 tablets (1,000 mg total) by mouth every 6 (six) hours as needed for moderate pain. 02/13/19   Fayrene Helperran, Bowie, PA-C  busPIRone (BUSPAR) 15 MG tablet Take 15 mg by mouth 3 (three) times daily. 11/01/19   [provider]  ciprofloxacin (CIPRO) 500 MG tablet Take 1 tablet (500 mg total) by mouth 2 times daily for 7 days. 12/23/21     clotrimazole (MYCELEX) 10 MG troche Take 10 mg by mouth daily as needed (Mouth Rash). 09/24/20   [provider]  cyanocobalamin (,VITAMIN B-12,) 1000 MCG/ML injection Inject 1 vial per week for 3 weeks, then 1 vial per month thereafter 06/21/20   Willow OraAndy, Camille L, MD  HYDROcodone-acetaminophen (NORCO/VICODIN) 5-325 MG tablet Take 1 tablet by mouth every 6 (six) hours as needed for moderate pain or severe pain. 12/13/21   Allwardt, Crist InfanteAlyssa M, PA-C  hydrOXYzine (VISTARIL) 50 MG capsule Take 50 mg by mouth in the morning, at noon, in the evening, and at bedtime. 12/26/20   [provider]  lamoTRIgine (LAMICTAL) 150 MG tablet Take 300 mg by mouth in the morning. 04/10/20    [provider]  lithium carbonate (LITHOBID) 300 MG CR tablet Take 300 mg by mouth at bedtime.    [provider]  metFORMIN (GLUCOPHAGE) 500 MG tablet Take 500 mg by mouth 2 (two) times daily. 11/01/19   [provider]  metoCLOPramide (REGLAN) 10 MG tablet Take 1 tablet (10 mg total) by mouth every 6 (six) hours as needed for nausea. 11/25/21   Jacalyn LefevreHaviland, Julie, MD  nortriptyline (PAMELOR) 10 MG capsule Take 1 capsule (10 mg total) by mouth at bedtime. 10/01/21   Kirsteins, Victorino SparrowAndrew E, MD  ondansetron (ZOFRAN-ODT) 4 MG disintegrating tablet Take 4 mg by mouth every 8 (eight) hours as needed for nausea or vomiting.    [provider]  ondansetron (ZOFRAN-ODT) 4 MG disintegrating tablet Dissolve 1 tablet (4 mg total) by mouth every 8 (eight) hours as needed for Nausea. 12/23/21     pantoprazole (PROTONIX) 20 MG tablet TAKE 1 TABLET BY MOUTH EVERY DAY Patient taking differently: 20 mg 2 (two) times daily. 06/21/21   Willow OraAndy, Camille L, MD  QUEtiapine (SEROQUEL) 400 MG tablet Take 400 mg by mouth at bedtime. Take with (2)  50 mg for a total of 500 mg in bedtime 08/24/19   [provider]  QUEtiapine (SEROQUEL) 50 MG tablet Take 100 mg by mouth at bedtime. Take with 400  mg for a total of 500 mg 06/27/20   [provider]  SYRINGE-NEEDLE, DISP, 3 ML 25G X 1" 3 ML MISC  Use 1 needle per application once weekly 06/21/20   Willow Ora, MD  tamsulosin (FLOMAX) 0.4 MG CAPS capsule Take 1 capsule (0.4 mg total) by mouth daily. 12/13/21   Allwardt, Crist Infante, PA-C  tizanidine (ZANAFLEX) 2 MG capsule TAKE 2 CAPSULES (4 MG TOTAL) BY MOUTH 3 (THREE) TIMES DAILY AS NEEDED FOR MUSCLE SPASMS. 12/10/21   Rodolph Bong, MD  valACYclovir (VALTREX) 1000 MG tablet Take 1,000 mg by mouth daily as needed (Cold sores).    [provider]      Allergies    Nsaids    Review of Systems   Review of Systems  Constitutional:  Negative for chills, fatigue and fever.   Respiratory:  Negative for cough and shortness of breath.   Cardiovascular:  Negative for chest pain.  Gastrointestinal:  Positive for nausea and vomiting. Negative for abdominal pain, constipation and diarrhea.  Genitourinary:  Positive for dysuria and flank pain.  Musculoskeletal:  Positive for back pain. Negative for neck pain and neck stiffness.  Skin:  Negative for rash.  Neurological:  Negative for light-headedness, numbness and headaches.  Psychiatric/Behavioral:  Negative for agitation.   All other systems reviewed and are negative.   Physical Exam Updated Vital Signs BP (!) 143/83 (BP Location: Right Arm)   Pulse 90   Temp 98.2 F (36.8 C) (Oral)   Resp 16   Ht 5\' 4"  (1.626 m)   Wt 110.2 kg   LMP 10/13/2021 (Approximate) Comment: neg U-preg 11/25/21  SpO2 100%   BMI 41.71 kg/m  Physical Exam Vitals and nursing note reviewed.  Constitutional:      General: She is not in acute distress.    Appearance: She is well-developed. She is not ill-appearing, toxic-appearing or diaphoretic.  HENT:     Head: Normocephalic and atraumatic.     Nose: Nose normal.  Eyes:     Conjunctiva/sclera: Conjunctivae normal.  Cardiovascular:     Rate and Rhythm: Normal rate and regular rhythm.     Heart sounds: No murmur heard. Pulmonary:     Effort: Pulmonary effort is normal. No respiratory distress.     Breath sounds: Normal breath sounds. No wheezing, rhonchi or rales.  Chest:     Chest wall: No tenderness.  Abdominal:     Palpations: Abdomen is soft.     Tenderness: There is no abdominal tenderness. There is left CVA tenderness. There is no right CVA tenderness, guarding or rebound.  Musculoskeletal:        General: Tenderness present. No swelling.     Cervical back: Neck supple.     Right lower leg: No edema.     Left lower leg: No edema.  Skin:    General: Skin is warm and dry.     Capillary Refill: Capillary refill takes less than 2 seconds.     Findings: No erythema or  rash.  Neurological:     General: No focal deficit present.     Mental Status: She is alert.  Psychiatric:        Mood and Affect: Mood normal.     ED Results / Procedures / Treatments   Labs (all labs ordered are listed, but only abnormal results are displayed) Labs Reviewed  URINALYSIS, ROUTINE W REFLEX MICROSCOPIC - Abnormal; Notable for the following components:      Result Value   Protein, ur TRACE (*)    Leukocytes,Ua SMALL (*)    All other components within normal limits  URINE CULTURE  BASIC METABOLIC PANEL  CBC  PREGNANCY, URINE    EKG None  Radiology CT Renal Stone Study  Result Date: 12/24/2021 CLINICAL DATA:  36 year old female with history of left lower quadrant abdominal pain for the past 2 weeks. Suspected kidney stone. EXAM: CT ABDOMEN AND PELVIS WITHOUT CONTRAST TECHNIQUE: Multidetector CT imaging of the abdomen and pelvis was performed following the standard protocol without IV contrast. RADIATION DOSE REDUCTION: This exam was performed according to the departmental dose-optimization program which includes automated exposure control, adjustment of the mA and/or kV according to patient size and/or use of iterative reconstruction technique. COMPARISON:  CT the abdomen and pelvis 11/25/2021. FINDINGS: Lower chest: Unremarkable. Hepatobiliary: No suspicious cystic or solid hepatic lesions are confidently identified on today's noncontrast CT examination. Status post cholecystectomy. Pancreas: No definite pancreatic mass or peripancreatic fluid collections or inflammatory changes are noted on today's noncontrast CT examination. Spleen: Unremarkable. Adrenals/Urinary Tract: There are no abnormal calcifications within the collecting system of either kidney, along the course of either ureter, or within the lumen of the urinary bladder. No hydroureteronephrosis or perinephric stranding to suggest urinary tract obstruction at this time. The unenhanced appearance of the kidneys is  unremarkable bilaterally. Unenhanced appearance of the urinary bladder is normal. Bilateral adrenal glands are normal in appearance. Stomach/Bowel: Postoperative changes of Roux-en-Y gastric bypass are noted. No pathologic dilatation of small bowel or colon. Normal appendix. Vascular/Lymphatic: No atherosclerotic calcifications are noted in the abdominal aorta or pelvic vasculature. No lymphadenopathy noted in the abdomen or pelvis. Reproductive: Unenhanced appearance of the uterus and ovaries is unremarkable. Other: No significant volume of ascites.  No pneumoperitoneum. Musculoskeletal: There are no aggressive appearing lytic or blastic lesions noted in the visualized portions of the skeleton. IMPRESSION: 1. No acute findings are noted in the abdomen or pelvis to account for the patient's symptoms. Specifically, no urinary tract calculi no findings of urinary tract obstruction. 2. Postoperative changes and incidental findings, as above. Electronically Signed   By: Trudie Reed M.D.   On: 12/24/2021 09:26    Procedures Procedures    Medications Ordered in ED Medications  promethazine (PHENERGAN) 25 mg in sodium chloride 0.9 % 50 mL IVPB (has no administration in time range)  HYDROmorphone (DILAUDID) injection 1 mg (1 mg Intravenous Given 12/24/21 0905)  ondansetron (ZOFRAN) injection 4 mg (4 mg Intravenous Given 12/24/21 0902)  oxyCODONE-acetaminophen (PERCOCET/ROXICET) 5-325 MG per tablet 1 tablet (1 tablet Oral Given 12/24/21 1050)  promethazine (PHENERGAN) 25 MG/ML injection (25 mg Intravenous Given 12/24/21 1052)    ED Course/ Medical Decision Making/ A&P                           Medical Decision Making Amount and/or Complexity of Data Reviewed Labs: ordered. Radiology: ordered.  Risk Prescription drug management.    [name] [name] is a 37 y.o. female with a past medical history significant for previous Roux-en-Y gastric bypass surgery, PCOS, GERD, previous cholecystectomy,  previous hiatal hernia repair, recurrent kidney stones requiring lithotripsy, hypertension, migraines, hypothyroidism, previous colon resection, anxiety, depression, and recent diagnosis of urinary tract infection yesterday who presents with worsening flank pain, nausea, vomiting, dysuria.  According to patient, she has a history of many kidney stones with lithotripsy being needed in the past.  She reports for the last 2 weeks she has had severe pain in her left flank, from her left back towards her left side of the abdomen.  She reports the pain has  gradually worsened.  She empirically was started on Keflex 2 weeks ago and then stopped after the culture was negative.  She then had symptoms persisting and went to urgent care yesterday where urinalysis was concerning for UTI with leukocytes and bacteria so she was started on Cipro.  She reports that she did not yet get it filled as her pharmacy was closed but was given a dose of Rocephin yesterday.  She was told that she could have a kidney stone and if her symptoms worsen she needs to return for likely CT scan to rule out an infected stone.  Patient is reporting nausea and vomiting but is not having any fevers or chills.  She denies any abdominal pain otherwise and denies any chest pain, shortness of breath, constipation, or diarrhea troubles.  No rashes reported to suggest shingles.  No trauma reported.  On exam, lungs clear and chest nontender.  Abdomen was nontender.  Left CVA area was tender as was left flank but no rash to suggest shingles.  Bowel sounds were appreciated.  Exam otherwise unremarkable.  Patient is having some dry heaving and nausea.  Clinically I do suspect that patient has either a UTI that now pyelonephritis versus kidney stone given her history of discomfort.  We had a shared decision made conversation and agreed to do a noncontrasted CT scan over something like an ultrasound to rule out stone.  We will also give her some pain and nausea  medicine.  We will get urinalysis and some labs.  If her CT scan is reassuring, suspect this is more related to a UTI and pyelohowever if a large stone is seen, we will dispo accordingly.  10:38 AM CT scan returned showing no evidence of acute pyelonephritis or other acute abnormality in her abdomen or pelvis.  I do suspect patient has a pyelonephritis given her dysuria and pain going to her left flank and left CVA area.  As she has not yet filled the prescription for antibiotics prescribed last night, she will fill it and take it.  She has not yet failed that.  We will have her add some pain medicine and nausea medicine to her regimen and give her 1 more dose of nausea medicine here.  She reports the Zofran did not seem to help so we will try Phenergan.  If this improves, will give written for Phenergan, several tablets of Percocet for pain control, and have her follow-up with her PCP.  Patient agrees with this plan.  Anticipate reassessment and discharge after medications.  Patient was able to tolerate p.o. after Phenergan.  She is feeling much better.  She will fill a prescription for Cipro that she was prescribed yesterday for the pyelo and will take the pain medicine and nausea medicine.  She will maintain hydration and rest.  She understands return precautions and follow-up instructions and was discharged in good condition        Final Clinical Impression(s) / ED Diagnoses Final diagnoses:  Flank pain  Nausea and vomiting, unspecified vomiting type  Pyelonephritis    Rx / DC Orders ED Discharge Orders          Ordered    promethazine (PHENERGAN) 25 MG tablet  Every 6 hours PRN        12/24/21 1343    oxyCODONE-acetaminophen (PERCOCET/ROXICET) 5-325 MG tablet  Every 4 hours PRN        12/24/21 1343            Clinical Impression: 1.  Flank pain   2. Nausea and vomiting, unspecified vomiting type   3. Pyelonephritis     Disposition: Discharge  Condition: Good  I have  discussed the results, Dx and Tx plan with the pt(& family if present). He/she/they expressed understanding and agree(s) with the plan. Discharge instructions discussed at great length. Strict return precautions discussed and pt &/or family have verbalized understanding of the instructions. No further questions at time of discharge.    New Prescriptions   OXYCODONE-ACETAMINOPHEN (PERCOCET/ROXICET) 5-325 MG TABLET    Take 1 tablet by mouth every 4 (four) hours as needed for severe pain.   PROMETHAZINE (PHENERGAN) 25 MG TABLET    Take 1 tablet (25 mg total) by mouth every 6 (six) hours as needed for nausea or vomiting.    Follow Up: Willow Ora, MD [address] [place] [address] [phone]     [facility] [address] [place] [address] [phone]        Cledith Kamiya, Canary Brim, MD 12/24/21 1348

## 2021-12-25 LAB — URINE CULTURE: Culture: NO GROWTH

## 2021-12-31 ENCOUNTER — Encounter: Payer: 59 | Attending: Physical Medicine & Rehabilitation | Admitting: Physical Medicine & Rehabilitation

## 2021-12-31 ENCOUNTER — Encounter: Payer: Self-pay | Admitting: Physical Medicine & Rehabilitation

## 2021-12-31 VITALS — BP 122/88 | HR 90 | Temp 98.3°F | Ht 64.0 in | Wt 242.0 lb

## 2021-12-31 DIAGNOSIS — M25561 Pain in right knee: Secondary | ICD-10-CM | POA: Diagnosis not present

## 2021-12-31 NOTE — Patient Instructions (Signed)
Please call if knee pain increases again .  Congrats on the weight .

## 2021-12-31 NOTE — Progress Notes (Signed)
36 year old female with chronic knee pain returns today for right knee injection.  In the interval time she has lost about 30 pounds and states her knee pain is not as bad anymore. Denies swelling in the knee no redness.  Exam no tenderness along the medial or lateral joint line no evidence of knee effusion on the right side or the left side.  No pain over the patellar or quadriceps tendon.  No fullness in the popliteal fossa  Ultrasound examination of the knee in the suprapatellar bursa no evidence of fluid.  Impression history of knee OA improved symptomatically after weight loss.  No need for corticosteroid injection. Follow-up as needed should she have increasing pain. Discussed with patient agrees with plan

## 2021-12-31 NOTE — Addendum Note (Signed)
Addended by: Becky Sax on: 12/31/2021 02:20 PM   Modules accepted: Orders

## 2022-01-01 ENCOUNTER — Telehealth: Payer: 59 | Admitting: Physician Assistant

## 2022-01-01 DIAGNOSIS — N898 Other specified noninflammatory disorders of vagina: Secondary | ICD-10-CM

## 2022-01-01 DIAGNOSIS — N3000 Acute cystitis without hematuria: Secondary | ICD-10-CM

## 2022-01-01 NOTE — Progress Notes (Signed)
Because of continued urinary symptoms despite course of antibiotic, and new onset vaginal discharge with belly pain, I feel your condition warrants further evaluation and I recommend that you be seen in a face to face visit.   NOTE: There will be NO CHARGE for this eVisit   If you are having a true medical emergency please call 911.      For an urgent face to face visit, [facility] has seven urgent care centers for your convenience:     [facility] at [facility] Directions [phone] [address] [place], [place] [address]    [facility] Health Urgent Care Center [facility]) Get Driving Directions [phone] [address] [place], [place] [address]  [facility] Center [facility] [place]) Get Driving Directions [phone] [address] [place],  [place]  [address]  [facility] Health Urgent Care Center [facility] - at [employer] Directions  [phone] [address] [place],  [place] [address]   [facility] Health Urgent Care at [facility] Get Driving Directions [phone] [address] [place], [place] [address]   [facility] Health Urgent Care at [facility] Get Driving Directions  [phone] [address] [place], [place] [address]   [facility] Health Urgent Care at [facility] Directions [phone] [address], [address] [place], [place] [address]  Your MyChart E-visit questionnaire answers were reviewed by a board certified advanced clinical practitioner to complete your personal care plan based on your specific symptoms.  Thank you for using e-Visits.

## 2022-01-04 ENCOUNTER — Other Ambulatory Visit: Payer: Self-pay | Admitting: Family Medicine

## 2022-01-09 ENCOUNTER — Encounter: Payer: Self-pay | Admitting: Family Medicine

## 2022-01-14 ENCOUNTER — Other Ambulatory Visit (HOSPITAL_BASED_OUTPATIENT_CLINIC_OR_DEPARTMENT_OTHER): Payer: Self-pay

## 2022-01-14 MED ORDER — VALACYCLOVIR HCL 1 G PO TABS
2000.0000 mg | ORAL_TABLET | Freq: Two times a day (BID) | ORAL | 2 refills | Status: DC
  Filled 2022-01-14: qty 20, 5d supply, fill #0
  Filled 2022-02-24: qty 20, 5d supply, fill #1
  Filled 2022-03-22: qty 20, 5d supply, fill #2

## 2022-01-14 MED ORDER — PROMETHAZINE HCL 25 MG PO TABS
25.0000 mg | ORAL_TABLET | Freq: Four times a day (QID) | ORAL | 0 refills | Status: DC | PRN
  Filled 2022-01-14: qty 21, 6d supply, fill #0

## 2022-01-20 ENCOUNTER — Encounter (HOSPITAL_BASED_OUTPATIENT_CLINIC_OR_DEPARTMENT_OTHER): Payer: Self-pay

## 2022-01-20 ENCOUNTER — Other Ambulatory Visit: Payer: Self-pay

## 2022-01-20 DIAGNOSIS — I10 Essential (primary) hypertension: Secondary | ICD-10-CM | POA: Insufficient documentation

## 2022-01-20 DIAGNOSIS — R1084 Generalized abdominal pain: Secondary | ICD-10-CM | POA: Diagnosis present

## 2022-01-20 DIAGNOSIS — E039 Hypothyroidism, unspecified: Secondary | ICD-10-CM | POA: Diagnosis not present

## 2022-01-20 LAB — COMPREHENSIVE METABOLIC PANEL
ALT: 10 U/L (ref 0–44)
AST: 12 U/L — ABNORMAL LOW (ref 15–41)
Albumin: 4.5 g/dL (ref 3.5–5.0)
Alkaline Phosphatase: 159 U/L — ABNORMAL HIGH (ref 38–126)
Anion gap: 9 (ref 5–15)
BUN: 12 mg/dL (ref 6–20)
CO2: 23 mmol/L (ref 22–32)
Calcium: 8.8 mg/dL — ABNORMAL LOW (ref 8.9–10.3)
Chloride: 106 mmol/L (ref 98–111)
Creatinine, Ser: 0.72 mg/dL (ref 0.44–1.00)
GFR, Estimated: >60 mL/min (ref >60)
Glucose, Bld: 86 mg/dL (ref 70–99)
Potassium: 3.4 mmol/L — ABNORMAL LOW (ref 3.5–5.1)
Sodium: 138 mmol/L (ref 135–145)
Total Bilirubin: 0.2 mg/dL — ABNORMAL LOW (ref 0.3–1.2)
Total Protein: 7.4 g/dL (ref 6.5–8.1)

## 2022-01-20 LAB — URINALYSIS, ROUTINE W REFLEX MICROSCOPIC
Bilirubin Urine: NEGATIVE
Glucose, UA: NEGATIVE mg/dL
Ketones, ur: NEGATIVE mg/dL
Nitrite: NEGATIVE
Specific Gravity, Urine: 1.019 (ref 1.005–1.030)
pH: 5.5 (ref 5.0–8.0)

## 2022-01-20 LAB — CBC
HCT: 39.4 % (ref 36.0–46.0)
Hemoglobin: 12.8 g/dL (ref 12.0–15.0)
MCH: 26.9 pg (ref 26.0–34.0)
MCHC: 32.5 g/dL (ref 30.0–36.0)
MCV: 82.9 fL (ref 80.0–100.0)
Platelets: 384 10*3/uL (ref 150–400)
RBC: 4.75 MIL/uL (ref 3.87–5.11)
RDW: 14.7 % (ref 11.5–15.5)
WBC: 11.9 10*3/uL — ABNORMAL HIGH (ref 4.0–10.5)
nRBC: 0 % (ref 0.0–0.2)

## 2022-01-20 LAB — LIPASE, BLOOD: Lipase: 232 U/L — ABNORMAL HIGH (ref 11–51)

## 2022-01-20 NOTE — ED Triage Notes (Signed)
Pt states she has abdominal pain and dysuria started today N/V x 2 days  +hematuria

## 2022-01-21 ENCOUNTER — Encounter (HOSPITAL_BASED_OUTPATIENT_CLINIC_OR_DEPARTMENT_OTHER): Payer: Self-pay | Admitting: Radiology

## 2022-01-21 ENCOUNTER — Emergency Department (HOSPITAL_BASED_OUTPATIENT_CLINIC_OR_DEPARTMENT_OTHER)
Admission: EM | Admit: 2022-01-21 | Discharge: 2022-01-21 | Disposition: A | Discharge status: Home / Self Care | Payer: 59 | Attending: Emergency Medicine | Admitting: Emergency Medicine

## 2022-01-21 ENCOUNTER — Emergency Department (HOSPITAL_BASED_OUTPATIENT_CLINIC_OR_DEPARTMENT_OTHER): Payer: 59

## 2022-01-21 DIAGNOSIS — R1084 Generalized abdominal pain: Secondary | ICD-10-CM

## 2022-01-21 MED ORDER — PROCHLORPERAZINE EDISYLATE 10 MG/2ML IJ SOLN
10.0000 mg | Freq: Once | INTRAMUSCULAR | Status: AC
  Administered 2022-01-21: 10 mg via INTRAVENOUS
  Filled 2022-01-21: qty 2

## 2022-01-21 MED ORDER — ONDANSETRON HCL 4 MG/2ML IJ SOLN
4.0000 mg | Freq: Once | INTRAMUSCULAR | Status: AC
  Administered 2022-01-21: 4 mg via INTRAVENOUS
  Filled 2022-01-21: qty 2

## 2022-01-21 MED ORDER — HYDROMORPHONE HCL 1 MG/ML IJ SOLN
1.0000 mg | Freq: Once | INTRAMUSCULAR | Status: AC
  Administered 2022-01-21: 1 mg via INTRAVENOUS
  Filled 2022-01-21: qty 1

## 2022-01-21 MED ORDER — IOHEXOL 300 MG/ML  SOLN
100.0000 mL | Freq: Once | INTRAMUSCULAR | Status: AC | PRN
  Administered 2022-01-21: 100 mL via INTRAVENOUS

## 2022-01-21 MED ORDER — SODIUM CHLORIDE 0.9 % IV BOLUS
1000.0000 mL | Freq: Once | INTRAVENOUS | Status: AC
  Administered 2022-01-21: 1000 mL via INTRAVENOUS

## 2022-01-21 NOTE — ED Provider Notes (Signed)
[facility]ERGENCY [facility] Emergency Department Provider Note MRN:  [id number]  Arrival date & time: 01/21/22     Chief Complaint   Abdominal Pain   History of Present Illness   [name] [name] is a 36 y.o. year-old female with a history of kidney stones, bipolar disorder, fibromyalgia, obesity presenting to the ED with chief complaint of abdominal pain.  Bilateral flank pain, diffuse abdominal pain, dysuria, hematuria for the past few days.  No fever, no chest pain.  Feels somewhat similar to prior kidney stones.  Review of Systems  A thorough review of systems was obtained and all systems are negative except as noted in the HPI and PMH.   Patient's Health History    Past Medical History:  Diagnosis Date   ADD (attention deficit disorder)    Anemia    Anxiety    Arthritis    B12 deficiency    Back pain    Bilateral swelling of feet    Bipolar 2 disorder (HCC) 09/19/2019   Constipation    DEPRESSION    Elevated cholesterol    Family history of colon cancer June 06, 2019   Father, 27s; deceased.    Fatty liver    Fibromyalgia    Gallbladder problem    GERD (gastroesophageal reflux disease)    Heartburn    History of kidney stones    History of stomach ulcers    Hypertension    Hypothyroidism    Hx of, normalized TSH during pregnancy 2011   Infertility, female    Lactose intolerance    Migraines    Morbid obesity (HCC)    s/p RY 08/2012 - start weight 290 pounds   Palpitations    Panic disorder    PCOS (polycystic ovarian syndrome)    Pneumonia    PONV (postoperative nausea and vomiting)    Pregnancy induced hypertension    Shortness of breath    Vitamin D deficiency     Past Surgical History:  Procedure Laterality Date   Quintella Reichert OSTEOTOMY Left 06/24/2017   Procedure: Quintella Reichert OSTEOTOMY;  Surgeon: [name], DPM;  Location: [facility];  Service: Podiatry;  Laterality: Left;   BIOPSY  07/04/2021   Procedure: BIOPSY;  Surgeon:  Rachael Fee, MD;  Location: [facility];  Service: Endoscopy;;   BUNIONECTOMY Left 06/24/2017   Procedure: Anthoney Harada;  Surgeon: [name], DPM;  Location: [facility];  Service: Podiatry;  Laterality: Left;   CESAREAN SECTION     x 2   CHOLECYSTECTOMY N/A 02/15/2019   Procedure: LAPAROSCOPIC CHOLECYSTECTOMY WITH INTRAOPERATIVE CHOLANGIOGRAM;  Surgeon: Andria Meuse, MD;  Location: [facility];  Service: General;  Laterality: N/A;   COLON RESECTION N/A 05/04/2020   Procedure: DIAGNOSTIC LAPAROSCOPY, RESECTION OF CANDYCANE, UPPER ENDOSCOPY;  Surgeon: Berna Bue, MD;  Location: [facility];  Service: General;  Laterality: N/A;   ESOPHAGOGASTRODUODENOSCOPY (EGD) WITH PROPOFOL N/A 07/04/2021   Procedure: ESOPHAGOGASTRODUODENOSCOPY (EGD) WITH PROPOFOL;  Surgeon: Rachael Fee, MD;  Location: [facility];  Service: Endoscopy;  Laterality: N/A;   FOOT SURGERY     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   Procedure: LAPAROSCOPIC ROUX-EN-Y GASTRIC;  Surgeon: Atilano Ina, MD;  Location: [facility];  Service: General;  Laterality: N/A;  laparoscopic roux-en-y gastric bypass   LITHOTRIPSY Left    TONSILLECTOMY     TUBAL LIGATION     UPPER GI ENDOSCOPY  08/24/2012   Procedure: UPPER GI ENDOSCOPY;  Surgeon: Atilano Ina, MD;  Location: [facility];  Service: General;;   WISDOM TOOTH EXTRACTION     XI ROBOTIC ASSISTED HIATAL HERNIA REPAIR N/A 10/18/2021   Procedure: XI ROBOTIC ASSISTED HIATAL HERNIA REPAIR WITH MESH UPPER ENDOSCOPY; ROBOTIC ASSISTED GASTROSTOMY TUBE PLACEMENT;  Surgeon: Gaynelle Adu, MD;  Location: [facility];  Service: General;  Laterality: N/A;    Family History  Problem Relation Age of Onset   Hyperlipidemia Father    Hypertension Father    Kidney disease Father    Colon cancer Father 19   Cancer Father    Depression Father    Anxiety disorder Father    Bipolar disorder Father    Sleep apnea Father    Obesity Father    Hypertension Mother    Depression  Mother    Anxiety disorder Mother    Obesity Mother    Hypertension Maternal Grandmother    Uterine cancer Maternal Grandmother 53   Diabetes Maternal Grandfather     Social History   Socioeconomic History   Marital status: Married    Spouse name: Not on file   Number of children: Not on file   Years of education: Not on file   Highest education level: Not on file  Occupational History   Occupation: stay at home mom  Tobacco Use   Smoking status: Never    Passive exposure: Never   Smokeless tobacco: Never  Vaping Use   Vaping Use: Never used  Substance and Sexual Activity   Alcohol use: Not Currently    Comment: rare   Drug use: No   Sexual activity: Yes    Birth control/protection: Surgical    Comment: tubal ligation  Other Topics Concern   Not on file  Social History Narrative   Married, lives with spouse and dtr born 07/2009. Employed as Associate Professor at [employer].   Social Determinants of Health   Financial Resource Strain: Not on file  Food Insecurity: Not on file  Transportation Needs: No Transportation Needs (02/23/2019)   PRAPARE - Administrator, Civil Service (Medical): No    Lack of Transportation (Non-Medical): No  Physical Activity: Not on file  Stress: Not on file  Social Connections: Not on file  Intimate Partner Violence: Not on file     Physical Exam   Vitals:   01/21/22 0302 01/21/22 0330  BP: 128/86 115/75  Pulse: 95 92  Resp: 18 18  Temp:    SpO2: 99% 94%    CONSTITUTIONAL: Well-appearing, NAD NEURO/PSYCH:  Alert and oriented x 3, no focal deficits EYES:  eyes equal and reactive ENT/NECK:  no LAD, no JVD CARDIO: Regular rate, well-perfused, normal S1 and S2 PULM:  CTAB no wheezing or rhonchi GI/GU:  non-distended, mild diffuse tenderness MSK/SPINE:  No gross deformities, no edema SKIN:  no rash, atraumatic   *Additional and/or pertinent findings included in MDM below  Diagnostic and Interventional Summary    EKG  Interpretation  Date/Time:    Ventricular Rate:    PR Interval:    QRS Duration:   QT Interval:    QTC Calculation:   R Axis:     Text Interpretation:         Labs Reviewed  CBC - Abnormal; Notable for the following components:      Result Value   WBC 11.9 (*)    All other components within normal limits  COMPREHENSIVE METABOLIC PANEL - Abnormal; Notable for the following components:   Potassium 3.4 (*)    Calcium 8.8 (*)  AST 12 (*)    Alkaline Phosphatase 159 (*)    Total Bilirubin 0.2 (*)    All other components within normal limits  LIPASE, BLOOD - Abnormal; Notable for the following components:   Lipase 232 (*)    All other components within normal limits  URINALYSIS, ROUTINE W REFLEX MICROSCOPIC - Abnormal; Notable for the following components:   APPearance HAZY (*)    Hgb urine dipstick LARGE (*)    Protein, ur TRACE (*)    Leukocytes,Ua TRACE (*)    Bacteria, UA RARE (*)    All other components within normal limits    CT ABDOMEN PELVIS W CONTRAST  Final Result      Medications  sodium chloride 0.9 % bolus 1,000 mL (0 mLs Intravenous Stopped 01/21/22 0255)  HYDROmorphone (DILAUDID) injection 1 mg (1 mg Intravenous Given 01/21/22 0103)  ondansetron (ZOFRAN) injection 4 mg (4 mg Intravenous Given 01/21/22 0103)  iohexol (OMNIPAQUE) 300 MG/ML solution 100 mL (100 mLs Intravenous Contrast Given 01/21/22 0121)  HYDROmorphone (DILAUDID) injection 1 mg (1 mg Intravenous Given 01/21/22 0255)  prochlorperazine (COMPAZINE) injection 10 mg (10 mg Intravenous Given 01/21/22 0255)     Procedures  /  Critical Care Procedures  ED Course and Medical Decision Making  Initial Impression and Ddx History of multiple abdominal surgeries, history of kidney stones, given history there is suspicion for kidney stone, pyelonephritis, SBO.  Labs reveal elevated lipase and so pancreatitis considered.  Awaiting CT  Past medical/surgical history that increases complexity of ED encounter:  Roux-en-Y  Interpretation of Diagnostics I personally reviewed the laboratory assessment and my interpretation is as follows: No significant blood count or electrolyte disturbance  Urinalysis with blood but no significant signs of infection.  CT imaging is without emergent process.  Patient Reassessment and Ultimate Disposition/Management     Patient feeling better after medications listed above, no signs of emergent process, appropriate for discharge.  Patient management required discussion with the following services or consulting groups:  None  Complexity of Problems Addressed Acute illness or injury that poses threat of life of bodily function  Additional Data Reviewed and Analyzed Further history obtained from: Prior labs/imaging results  Additional Factors Impacting ED Encounter Risk Use of parenteral controlled substances  Elmer Sow. Pilar Plate, MD [facility] Emergency Medicine [facility] [email or web address]  Final Clinical Impressions(s) / ED Diagnoses     ICD-10-CM   1. Generalized abdominal pain  R10.84       ED Discharge Orders     None        Discharge Instructions Discussed with and Provided to Patient:     Discharge Instructions      You were evaluated in the Emergency Department and after careful evaluation, we did not find any emergent condition requiring admission or further testing in the hospital.  Your exam/testing today is overall reassuring.  Recommend follow-up with your regular doctor/surgeon to discuss her symptoms.  Please return to the Emergency Department if you experience any worsening of your condition.   Thank you for allowing [place] to be a part of your care.       Sabas Sous, MD 01/21/22 [id number]

## 2022-01-21 NOTE — ED Notes (Signed)
Pt verbalizes understanding of discharge instructions. Opportunity for questioning and answers were provided. Pt discharged from ED to home with husband.    

## 2022-01-21 NOTE — Discharge Instructions (Signed)
You were evaluated in the Emergency Department and after careful evaluation, we did not find any emergent condition requiring admission or further testing in the hospital.  Your exam/testing today is overall reassuring.  Recommend follow-up with your regular doctor/surgeon to discuss her symptoms.  Please return to the Emergency Department if you experience any worsening of your condition.   Thank you for allowing [place] to be a part of your care.

## 2022-01-22 ENCOUNTER — Encounter (INDEPENDENT_AMBULATORY_CARE_PROVIDER_SITE_OTHER): Payer: Self-pay

## 2022-01-27 ENCOUNTER — Other Ambulatory Visit (HOSPITAL_BASED_OUTPATIENT_CLINIC_OR_DEPARTMENT_OTHER): Payer: Self-pay

## 2022-01-27 ENCOUNTER — Telehealth: Payer: 59 | Admitting: Emergency Medicine

## 2022-01-27 DIAGNOSIS — B370 Candidal stomatitis: Secondary | ICD-10-CM

## 2022-01-27 MED ORDER — CLOTRIMAZOLE 10 MG MT TROC
10.0000 mg | Freq: Every day | OROMUCOSAL | 0 refills | Status: DC
  Filled 2022-01-27: qty 50, 10d supply, fill #0

## 2022-01-27 NOTE — Progress Notes (Signed)
E-Visit for Mouth Ulcers  We are sorry that you are not feeling well.  Here is how we plan to help!  Based on what you have shared with me, it appears that you do have thrush. The picture was very helpful!     The following medications should decrease the discomfort and help with healing.  Clotrimazole oropharyngeal tablets. Slowly dissolve in mouth 5 times a day.    Mouth ulcers are painful areas in the mouth and gums. These are also known as "canker sores".  They can occur anywhere inside the mouth. While mostly harmless, mouth ulcers can be extremely uncomfortable and may make it difficult to eat, drink, and brush your teeth.  You may have more than 1 ulcer and they can vary and change in size. Mouth ulcers are not contagious and should not be confused with cold sores.  Cold sores appear on the lip or around the outside of the mouth and often begin with a tingling, burning or itching sensation.   While the exact causes are unknown, some common causes and factors that may aggravate mouth ulcers include: Genetics - Sometimes mouth ulcers run in families High alcohol intake Acidic foods such as citrus fruits like pineapple, grapefruit, orange fruits/juices, may aggravate mouth ulcers Other foods high in acidity or spice such as coffee, chocolate, chips, pretzels, eggs, nuts, cheese Quitting smoking Injury caused by biting the tongue or inside of the cheek Diet lacking in B-12, zinc, folic acid or iron Female hormone shifts with menstruation Excessive fatigue, emotional stress or anxiety Prevention: Talk to your doctor if you are taking meds that are known to cause mouth ulcers such as:   Anti-inflammatory drugs (for example Ibuprofen, Naproxen sodium), pain killers, Beta blockers, Oral nicotine replacement drugs, Some street drugs (heroin).   Avoid allowing any tablets to dissolve in your mouth that are meant to swallowed whole Avoid foods/drinks that trigger or worsen symptoms Keep your  mouth clean with daily brushing and flossing  Home Care: The goal with treatment is to ease the pain where ulcers occur and help them heal as quickly as possible.  There is no medical treatment to prevent mouth ulcers from coming back or recurring.  Avoid spicy and acidic foods Eat soft foods and avoid rough, crunchy foods Avoid chewing gum Do not use toothpaste that contains sodium lauryl sulphite Use a straw to drink which helps avoid liquids toughing the ulcers near the front of your mouth Use a very soft toothbrush If you have dentures or dental hardware that you feel is not fitting well or contributing to his, please see your dentist. Use saltwater mouthwash which helps healing. Dissolve a  teaspoon of salt in a glass of warm water. Swish around your mouth and spit it out. This can be used as needed if it is soothing.   GET HELP RIGHT AWAY IF: Persistent ulcers require checking IN PERSON (face to face). Any mouth lesion lasting longer than a month should be seen by your DENTIST as soon as possible for evaluation for possible oral cancer. If you have a non-painful ulcer in 1 or more areas of your mouth Ulcers that are spreading, are very large or particularly painful Ulcers last longer than one week without improving on treatment If you develop a fever, swollen glands and begin to feel unwell Ulcers that developed after starting a new medication MAKE SURE YOU: Understand these instructions. Will watch your condition. Will get help right away if you are not doing well  or get worse.  Thank you for choosing an e-visit.  Your e-visit answers were reviewed by a board certified advanced clinical practitioner to complete your personal care plan. Depending upon the condition, your plan could have included both over the counter or prescription medications.  Please review your pharmacy choice. Make sure the pharmacy is open so you can pick up prescription now. If there is a problem, you may  contact your provider through [employer] and have the prescription routed to another pharmacy.  Your safety is important to [place]. If you have drug allergies check your prescription carefully.   For the next 24 hours you can use MyChart to ask questions about today's visit, request a non-urgent call back, or ask for a work or school excuse. You will get an email in the next two days asking about your experience. I hope that your e-visit has been valuable and will speed your recovery.  I have spent 5 minutes in review of e-visit questionnaire, review and updating patient chart, medical decision making and response to patient.   Rica Mast, PhD, FNP-BC

## 2022-01-28 ENCOUNTER — Other Ambulatory Visit (HOSPITAL_BASED_OUTPATIENT_CLINIC_OR_DEPARTMENT_OTHER): Payer: Self-pay

## 2022-02-18 ENCOUNTER — Other Ambulatory Visit (HOSPITAL_BASED_OUTPATIENT_CLINIC_OR_DEPARTMENT_OTHER): Payer: Self-pay

## 2022-02-18 MED ORDER — LAMOTRIGINE 150 MG PO TABS
300.0000 mg | ORAL_TABLET | Freq: Every day | ORAL | 3 refills | Status: DC
Start: 2022-02-18 — End: 2022-06-24
  Filled 2022-02-18: qty 60, 30d supply, fill #0
  Filled 2022-03-22: qty 60, 30d supply, fill #1
  Filled 2022-04-24: qty 60, 30d supply, fill #2
  Filled 2022-06-01: qty 60, 30d supply, fill #3

## 2022-02-18 MED ORDER — QUETIAPINE FUMARATE 50 MG PO TABS
ORAL_TABLET | ORAL | 0 refills | Status: DC
  Filled 2022-02-18 – 2022-02-19 (×2): qty 60, 30d supply, fill #0

## 2022-02-20 ENCOUNTER — Other Ambulatory Visit (HOSPITAL_BASED_OUTPATIENT_CLINIC_OR_DEPARTMENT_OTHER): Payer: Self-pay

## 2022-02-22 ENCOUNTER — Encounter: Payer: Self-pay | Admitting: Family Medicine

## 2022-02-24 ENCOUNTER — Other Ambulatory Visit (HOSPITAL_BASED_OUTPATIENT_CLINIC_OR_DEPARTMENT_OTHER): Payer: Self-pay

## 2022-02-24 ENCOUNTER — Ambulatory Visit (INDEPENDENT_AMBULATORY_CARE_PROVIDER_SITE_OTHER): Payer: 59 | Admitting: Podiatry

## 2022-02-24 ENCOUNTER — Ambulatory Visit (INDEPENDENT_AMBULATORY_CARE_PROVIDER_SITE_OTHER): Payer: 59

## 2022-02-24 DIAGNOSIS — M722 Plantar fascial fibromatosis: Secondary | ICD-10-CM | POA: Diagnosis not present

## 2022-02-24 DIAGNOSIS — M2142 Flat foot [pes planus] (acquired), left foot: Secondary | ICD-10-CM

## 2022-02-24 DIAGNOSIS — M2141 Flat foot [pes planus] (acquired), right foot: Secondary | ICD-10-CM

## 2022-02-24 DIAGNOSIS — M79672 Pain in left foot: Secondary | ICD-10-CM

## 2022-02-24 DIAGNOSIS — M79671 Pain in right foot: Secondary | ICD-10-CM

## 2022-02-24 MED ORDER — CYANOCOBALAMIN 1000 MCG/ML IJ SOLN
1000.0000 ug | INTRAMUSCULAR | 0 refills | Status: DC
  Filled 2022-02-24: qty 1, 30d supply, fill #0
  Filled 2022-03-22: qty 1, 30d supply, fill #1
  Filled 2022-04-09 – 2022-04-21 (×2): qty 1, 30d supply, fill #2
  Filled 2022-06-01: qty 1, 30d supply, fill #3
  Filled 2022-06-30: qty 1, 30d supply, fill #4
  Filled 2022-08-09: qty 1, 30d supply, fill #5
  Filled 2022-09-09: qty 1, 30d supply, fill #6
  Filled 2022-10-10: qty 1, 30d supply, fill #7
  Filled 2022-11-23: qty 1, 30d supply, fill #8
  Filled 2022-12-23: qty 1, 30d supply, fill #9

## 2022-02-24 MED ORDER — TRIAMCINOLONE ACETONIDE 10 MG/ML IJ SUSP: 10.0000 mg | Freq: Once | INTRAMUSCULAR | Status: DC

## 2022-02-24 MED ORDER — "SYRINGE/NEEDLE (DISP) 25G X 1"" 3 ML MISC"
0 refills | Status: DC
  Filled 2022-02-24: qty 1, 30d supply, fill #0

## 2022-02-24 NOTE — Progress Notes (Signed)
Subjective: Chief Complaint  Patient presents with   Plantar Fasciitis    Patient came in for bilateral heel pain, patient rates the pain 6 out of 10, X-Rays taken today     36 year old female presents with above concerns.  She states that the plantar fascia still come back and she has been getting pain in the bottoms of the heels.  No injuries that she reports.  No increased numbness or tingling.  She is no other concerns today.   Objective: AAO x3, NAD DP/PT pulses palpable bilaterally, CRT less than 3 seconds Tenderness to palpation along the plantar medial tubercle of the calcaneus at the insertion of plantar fascia on the left and right foot. There is no pain along the course of the plantar fascia within the arch of the foot. Plantar fascia appears to be intact. There is no pain with lateral compression of the calcaneus or pain with vibratory sensation. There is no pain along the course or insertion of the achilles tendon. No other areas of tenderness to bilateral lower extremities.  Negative Tinel sign. No pain with calf compression, swelling, warmth, erythema  Assessment: Bilateral heel pain, Plantar fasciitis  Plan: -All treatment options discussed with the patient including all alternatives, risks, complications.  -X-rays were obtained and reviewed bilaterally.  3 views were obtained.  No evidence of acute fracture.  Flatfoot is present there is decreased calcaneal inclination. -Steroid injection performed bilaterally.  See procedure note below. -Plantar fascial brace dispensed x2 to help support the plantar fascia. -Stretching, icing daily -Discussed shoes and good arch support. -Patient encouraged to call the office with any questions, concerns, change in symptoms.   Procedure: Injection Tendon/Ligament Discussed alternatives, risks, complications and verbal consent was obtained.  Location: Bilateral plantar fascia at the glabrous junction; medial approach. Skin Prep:  Alcohol. Injectate: 0.5cc 0.5% marcaine plain, 0.5 cc 2% lidocaine plain and, 1 cc kenalog 10. Disposition: Patient tolerated procedure well. Injection site dressed with a band-aid.  Post-injection care was discussed and return precautions discussed.   Return if symptoms worsen or fail to improve.  [name] DPM

## 2022-02-24 NOTE — Patient Instructions (Signed)
For instructions on how to put on your Plantar Fascial Brace, please visit [email or web address]   Plantar Fasciitis (Heel Spur Syndrome) with Rehab The plantar fascia is a fibrous, ligament-like, soft-tissue structure that spans the bottom of the foot. Plantar fasciitis is a condition that causes pain in the foot due to inflammation of the tissue. SYMPTOMS   Pain and tenderness on the underneath side of the foot.  Pain that worsens with standing or walking. CAUSES  Plantar fasciitis is caused by irritation and injury to the plantar fascia on the underneath side of the foot. Common mechanisms of injury include:  Direct trauma to bottom of the foot.  Damage to a small nerve that runs under the foot where the main fascia attaches to the heel bone.  Stress placed on the plantar fascia due to bone spurs. RISK INCREASES WITH:   Activities that place stress on the plantar fascia (running, jumping, pivoting, or cutting).  Poor strength and flexibility.  Improperly fitted shoes.  Tight calf muscles.  Flat feet.  Failure to warm-up properly before activity.  Obesity. PREVENTION  Warm up and stretch properly before activity.  Allow for adequate recovery between workouts.  Maintain physical fitness:  Strength, flexibility, and endurance.  Cardiovascular fitness.  Maintain a health body weight.  Avoid stress on the plantar fascia.  Wear properly fitted shoes, including arch supports for individuals who have flat feet.  PROGNOSIS  If treated properly, then the symptoms of plantar fasciitis usually resolve without surgery. However, occasionally surgery is necessary.  RELATED COMPLICATIONS   Recurrent symptoms that may result in a chronic condition.  Problems of the lower back that are caused by compensating for the injury, such as limping.  Pain or weakness of the foot during push-off following surgery.  Chronic inflammation, scarring, and partial or complete  fascia tear, occurring more often from repeated injections.  TREATMENT  Treatment initially involves the use of ice and medication to help reduce pain and inflammation. The use of strengthening and stretching exercises may help reduce pain with activity, especially stretches of the Achilles tendon. These exercises may be performed at home or with a therapist. Your caregiver may recommend that you use heel cups of arch supports to help reduce stress on the plantar fascia. Occasionally, corticosteroid injections are given to reduce inflammation. If symptoms persist for greater than 6 months despite non-surgical (conservative), then surgery may be recommended.   MEDICATION   If pain medication is necessary, then nonsteroidal anti-inflammatory medications, such as aspirin and ibuprofen, or other minor pain relievers, such as acetaminophen, are often recommended.  Do not take pain medication within 7 days before surgery.  Prescription pain relievers may be given if deemed necessary by your caregiver. Use only as directed and only as much as you need.  Corticosteroid injections may be given by your caregiver. These injections should be reserved for the most serious cases, because they may only be given a certain number of times.  HEAT AND COLD  Cold treatment (icing) relieves pain and reduces inflammation. Cold treatment should be applied for 10 to 15 minutes every 2 to 3 hours for inflammation and pain and immediately after any activity that aggravates your symptoms. Use ice packs or massage the area with a piece of ice (ice massage).  Heat treatment may be used prior to performing the stretching and strengthening activities prescribed by your caregiver, physical therapist, or athletic trainer. Use a heat pack or soak the injury in warm water.  SEEK IMMEDIATE MEDICAL   CARE IF:  Treatment seems to offer no benefit, or the condition worsens.  Any medications produce adverse side effects.   EXERCISES- RANGE OF MOTION (ROM) AND STRETCHING EXERCISES - Plantar Fasciitis (Heel Spur Syndrome) These exercises may help you when beginning to rehabilitate your injury. Your symptoms may resolve with or without further involvement from your physician, physical therapist or athletic trainer. While completing these exercises, remember:   Restoring tissue flexibility helps normal motion to return to the joints. This allows healthier, less painful movement and activity.  An effective stretch should be held for at least 30 seconds.  A stretch should never be painful. You should only feel a gentle lengthening or release in the stretched tissue.  RANGE OF MOTION - Toe Extension, Flexion  Sit with your right / left leg crossed over your opposite knee.  Grasp your toes and gently pull them back toward the top of your foot. You should feel a stretch on the bottom of your toes and/or foot.  Hold this stretch for 10 seconds.  Now, gently pull your toes toward the bottom of your foot. You should feel a stretch on the top of your toes and or foot.  Hold this stretch for 10 seconds. Repeat  times. Complete this stretch 3 times per day.   RANGE OF MOTION - Ankle Dorsiflexion, Active Assisted  Remove shoes and sit on a chair that is preferably not on a carpeted surface.  Place right / left foot under knee. Extend your opposite leg for support.  Keeping your heel down, slide your right / left foot back toward the chair until you feel a stretch at your ankle or calf. If you do not feel a stretch, slide your bottom forward to the edge of the chair, while still keeping your heel down.  Hold this stretch for 10 seconds. Repeat 3 times. Complete this stretch 2 times per day.   STRETCH  Gastroc, Standing  Place hands on wall.  Extend right / left leg, keeping the front knee somewhat bent.  Slightly point your toes inward on your back foot.  Keeping your right / left heel on the floor and your  knee straight, shift your weight toward the wall, not allowing your back to arch.  You should feel a gentle stretch in the right / left calf. Hold this position for 10 seconds. Repeat 3 times. Complete this stretch 2 times per day.  STRETCH  Soleus, Standing  Place hands on wall.  Extend right / left leg, keeping the other knee somewhat bent.  Slightly point your toes inward on your back foot.  Keep your right / left heel on the floor, bend your back knee, and slightly shift your weight over the back leg so that you feel a gentle stretch deep in your back calf.  Hold this position for 10 seconds. Repeat 3 times. Complete this stretch 2 times per day.  STRETCH  Gastrocsoleus, Standing  Note: This exercise can place a lot of stress on your foot and ankle. Please complete this exercise only if specifically instructed by your caregiver.   Place the ball of your right / left foot on a step, keeping your other foot firmly on the same step.  Hold on to the wall or a rail for balance.  Slowly lift your other foot, allowing your body weight to press your heel down over the edge of the step.  You should feel a stretch in your right / left calf.  Hold this   position for 10 seconds.  Repeat this exercise with a slight bend in your right / left knee. Repeat 3 times. Complete this stretch 2 times per day.   STRENGTHENING EXERCISES - Plantar Fasciitis (Heel Spur Syndrome)  These exercises may help you when beginning to rehabilitate your injury. They may resolve your symptoms with or without further involvement from your physician, physical therapist or athletic trainer. While completing these exercises, remember:   Muscles can gain both the endurance and the strength needed for everyday activities through controlled exercises.  Complete these exercises as instructed by your physician, physical therapist or athletic trainer. Progress the resistance and repetitions only as guided.  STRENGTH -  Towel Curls  Sit in a chair positioned on a non-carpeted surface.  Place your foot on a towel, keeping your heel on the floor.  Pull the towel toward your heel by only curling your toes. Keep your heel on the floor. Repeat 3 times. Complete this exercise 2 times per day.  STRENGTH - Ankle Inversion  Secure one end of a rubber exercise band/tubing to a fixed object (table, pole). Loop the other end around your foot just before your toes.  Place your fists between your knees. This will focus your strengthening at your ankle.  Slowly, pull your big toe up and in, making sure the band/tubing is positioned to resist the entire motion.  Hold this position for 10 seconds.  Have your muscles resist the band/tubing as it slowly pulls your foot back to the starting position. Repeat 3 times. Complete this exercises 2 times per day.  Document Released: 06/02/2005 Document Revised: 08/25/2011 Document Reviewed: 09/14/2008 [employer] Patient Information 2014 [employer]. 

## 2022-03-04 ENCOUNTER — Ambulatory Visit: Payer: 59 | Admitting: Family

## 2022-03-04 ENCOUNTER — Other Ambulatory Visit (HOSPITAL_BASED_OUTPATIENT_CLINIC_OR_DEPARTMENT_OTHER): Payer: Self-pay

## 2022-03-04 ENCOUNTER — Encounter: Payer: Self-pay | Admitting: Family

## 2022-03-04 VITALS — BP 132/84 | HR 86 | Temp 98.7°F | Ht 64.0 in | Wt 238.0 lb

## 2022-03-04 DIAGNOSIS — J069 Acute upper respiratory infection, unspecified: Secondary | ICD-10-CM | POA: Diagnosis not present

## 2022-03-04 DIAGNOSIS — H61302 Acquired stenosis of left external ear canal, unspecified: Secondary | ICD-10-CM

## 2022-03-04 MED ORDER — HYDROCORTISONE-ACETIC ACID 1-2 % OT SOLN
4.0000 [drp] | Freq: Three times a day (TID) | OTIC | 0 refills | Status: AC
  Filled 2022-03-04: qty 10, 17d supply, fill #0

## 2022-03-04 MED ORDER — HYDROCODONE BIT-HOMATROP MBR 5-1.5 MG/5ML PO SOLN
5.0000 mL | Freq: Three times a day (TID) | ORAL | 0 refills | Status: AC | PRN
  Filled 2022-03-04: qty 120, 8d supply, fill #0

## 2022-03-04 MED ORDER — PREDNISONE 20 MG PO TABS
ORAL_TABLET | ORAL | 0 refills | Status: DC
  Filled 2022-03-04: qty 8, 5d supply, fill #0

## 2022-03-04 NOTE — Progress Notes (Signed)
Patient ID: [name] [name], female    DOB: 1985-09-05, 36 y.o.   MRN: [id number]  Chief Complaint  Patient presents with   Cough    sx for 5 days, covid neg.    HPI:      URI:   c/o left ear pain, productive cough (green mucus), sinus pressure, Headaches, chills and fatigue. Has tried delsym, guaifenesin-codeine cough syrup, Atrovent nasal spray, ibuprofen which did not help her symptoms. Symptoms present for 5 days. 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 Covid negative yesterday.   Assessment & Plan:  1. Viral upper respiratory tract infection sending prednisone, low dose taper, and refill of cough syrup, advised on use & SE, drinking 2L fluids qd, can continue home sinus meds & Ibuprofen for aches, fever, sinus pressure.  - predniSONE (DELTASONE) 20 MG tablet; Take 2 pills in the morning with breakfast for 3 days, then 1 pill for 2 days  Dispense: 8 tablet; Refill: 0 - HYDROcodone bit-homatropine (HYCODAN) 5-1.5 MG/5ML syrup; Take 5 mLs by mouth every 8 (eight) hours as needed for up to 8 days for cough.  Dispense: 120 mL; Refill: 0  2. 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 Apply to affected ear. For ear canal irritation/otitis externa.  Dispense: 10 mL; Refill: 0   Subjective:    Outpatient Medications Prior to Visit  Medication Sig Dispense Refill   acetaminophen (TYLENOL) 500 MG tablet Take 2 tablets (1,000 mg total) by mouth every 6 (six) hours as needed for moderate pain. 30 tablet 0   busPIRone (BUSPAR) 15 MG tablet Take 15 mg by mouth 3 (three) times daily.     clotrimazole (MYCELEX) 10 MG troche Take 1 tablet (10 mg total) by mouth 5 (five) times daily. 50 tablet 0   cyanocobalamin (VITAMIN B12) 1000 MCG/ML injection Inject 1 mL (1,000 mcg total) into the muscle every 30 (thirty) days. 10 mL 0   Eszopiclone 3 MG TABS Take 3  mg by mouth at bedtime.     hydrOXYzine (VISTARIL) 50 MG capsule Take 50 mg by mouth in the morning, at noon, in the evening, and at bedtime.     lamoTRIgine (LAMICTAL) 150 MG tablet Take 2 oral tablet once a day (if out of lamictal more than 1 wk do not restart, call [phone]) 60 tablet 3   lithium carbonate (LITHOBID) 300 MG CR tablet Take 300 mg by mouth at bedtime.     metFORMIN (GLUCOPHAGE) 500 MG tablet Take 500 mg by mouth 2 (two) times daily.     nortriptyline (PAMELOR) 10 MG capsule Take 1 capsule (10 mg total) by mouth at bedtime. 90 capsule 1   pantoprazole (PROTONIX) 20 MG tablet TAKE 1 TABLET BY MOUTH EVERY DAY 90 tablet 1   promethazine (PHENERGAN) 25 MG tablet Take 1 tablet (25 mg total) by mouth every 6 (six) hours as needed for nausea or vomiting. 21 tablet 0   QUEtiapine (SEROQUEL) 50 MG tablet Take 1-2 po qhs 60 tablet 0   SYRINGE-NEEDLE, DISP, 3 ML 25G X 1" 3 ML MISC Use 1 needle per application once montnly 20 each 0   tizanidine (ZANAFLEX) 2 MG capsule TAKE 2 CAPSULES (4 MG TOTAL) BY MOUTH 3 (THREE) TIMES DAILY AS NEEDED FOR MUSCLE SPASMS. 540 capsule 1   valACYclovir (VALTREX) 1000 MG tablet Take 2 tablets (2,000 mg total) by  mouth 2 (two) times daily. Once, as needed for cold sores 20 tablet 2   VRAYLAR 1.5 MG capsule Take 1.5 mg by mouth.     Cholecalciferol 1.25 MG (50000 UT) capsule  (Patient not taking: Reported on 12/31/2021)     cyclobenzaprine (FLEXERIL) 10 MG tablet Take 1 tablet by mouth 3 (three) times daily as needed. (Patient not taking: Reported on 12/31/2021)     dicyclomine (BENTYL) 20 MG tablet Take 1 tablet by mouth 2 (two) times daily. (Patient not taking: Reported on 12/31/2021)     DULoxetine (CYMBALTA) 30 MG capsule  (Patient not taking: Reported on 12/31/2021)     DULoxetine HCl 60 MG CSDR Take 1 capsule every day by oral route. (Patient not taking: Reported on 12/31/2021)     escitalopram (LEXAPRO) 10 MG tablet  (Patient not taking: Reported on  12/31/2021)     escitalopram (LEXAPRO) 20 MG tablet      ferrous sulfate 325 (65 FE) MG tablet Take 1 tablet every day by oral route.     gabapentin (NEURONTIN) 100 MG capsule      guaiFENesin-codeine 100-10 MG/5ML syrup Take 10 mLs by mouth at bedtime as needed.     lamoTRIgine (LAMICTAL) 150 MG tablet Take 300 mg by mouth in the morning.     ondansetron (ZOFRAN-ODT) 4 MG disintegrating tablet Take 4 mg by mouth every 8 (eight) hours as needed for nausea or vomiting.     oxyCODONE-acetaminophen (PERCOCET/ROXICET) 5-325 MG tablet Take 1 tablet by mouth every 4 (four) hours as needed for severe pain. 10 tablet 0   QUEtiapine (SEROQUEL) 400 MG tablet Take 400 mg by mouth at bedtime. Take with (2)  50 mg for a total of 500 mg in bedtime     QUEtiapine (SEROQUEL) 50 MG tablet Take 100 mg by mouth at bedtime. Take with 400  mg for a total of 500 mg     Facility-Administered Medications Prior to Visit  Medication Dose Route Frequency Provider Last Rate Last Admin   triamcinolone acetonide (KENALOG) 10 MG/ML injection 10 mg  10 mg Other Once Trula Slade, DPM       Past Medical History:  Diagnosis Date   ADD (attention deficit disorder)    Anemia    Anxiety    Arthritis    B12 deficiency    Back pain    Bilateral swelling of feet    Bipolar 2 disorder ([place]) 09/19/2019   Constipation    DEPRESSION    Elevated cholesterol    Family history of colon cancer May 25, 2019   Father, 74s; deceased.    Fatty liver    Fibromyalgia    Gallbladder problem    GERD (gastroesophageal reflux disease)    Heartburn    History of kidney stones    History of stomach ulcers    Hypertension    Hypothyroidism    Hx of, normalized TSH during pregnancy 2011   Infertility, female    Lactose intolerance    Migraines    Morbid obesity ([facility])    s/p RY 08/2012 - start weight 290 pounds   Palpitations    Panic disorder    PCOS (polycystic ovarian syndrome)    Pneumonia    PONV (postoperative nausea and  vomiting)    Pregnancy induced hypertension    Shortness of breath    Vitamin D deficiency    Past Surgical History:  Procedure Laterality Date   AIKEN OSTEOTOMY Left 06/24/2017   Procedure: Barbie Banner OSTEOTOMY;  Surgeon: Trula Slade, DPM;  Location: [facility];  Service: Podiatry;  Laterality: Left;   BIOPSY  07/04/2021   Procedure: BIOPSY;  Surgeon: Milus Banister, MD;  Location: [facility];  Service: Endoscopy;;   BUNIONECTOMY Left 06/24/2017   Procedure: Annye English;  Surgeon: Trula Slade, DPM;  Location: [facility];  Service: Podiatry;  Laterality: Left;   CESAREAN SECTION     x 2   CHOLECYSTECTOMY N/A 02/15/2019   Procedure: LAPAROSCOPIC CHOLECYSTECTOMY WITH INTRAOPERATIVE CHOLANGIOGRAM;  Surgeon: Ileana Roup, MD;  Location: [facility];  Service: General;  Laterality: N/A;   COLON RESECTION N/A 05/04/2020   Procedure: DIAGNOSTIC LAPAROSCOPY, RESECTION OF CANDYCANE, UPPER ENDOSCOPY;  Surgeon: Clovis Riley, MD;  Location: [facility];  Service: General;  Laterality: N/A;   ESOPHAGOGASTRODUODENOSCOPY (EGD) WITH PROPOFOL N/A 07/04/2021   Procedure: ESOPHAGOGASTRODUODENOSCOPY (EGD) WITH PROPOFOL;  Surgeon: Milus Banister, MD;  Location: [facility];  Service: Endoscopy;  Laterality: N/A;   FOOT SURGERY     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   Procedure: LAPAROSCOPIC ROUX-EN-Y GASTRIC;  Surgeon: Gayland Curry, MD;  Location: [facility];  Service: General;  Laterality: N/A;  laparoscopic roux-en-y gastric bypass   LITHOTRIPSY Left    TONSILLECTOMY     TUBAL LIGATION     UPPER GI ENDOSCOPY  08/24/2012   Procedure: UPPER GI ENDOSCOPY;  Surgeon: Gayland Curry, MD;  Location: [facility];  Service: General;;   WISDOM TOOTH EXTRACTION     XI ROBOTIC ASSISTED HIATAL HERNIA REPAIR N/A 10/18/2021   Procedure: XI ROBOTIC ASSISTED HIATAL HERNIA REPAIR WITH MESH UPPER ENDOSCOPY; ROBOTIC ASSISTED GASTROSTOMY TUBE PLACEMENT;  Surgeon: Greer Pickerel, MD;  Location:  [facility];  Service: General;  Laterality: N/A;   Allergies  Allergen Reactions   Nsaids Other (See Comments)    Gastric bypass "   Other Other (See Comments)    Ambien "sleep walking" Other reaction(s): Other (See Comments)   Keflex [Cephalexin] Other (See Comments)      Objective:    Physical Exam Vitals and nursing note reviewed.  Constitutional:      Appearance: Normal appearance.  HENT:     Right Ear: Tympanic membrane and ear canal normal.     Left Ear: Tympanic membrane normal. No decreased hearing noted. Swelling (mild, ear canal) present. No tenderness.     Nose:     Right Sinus: Frontal sinus tenderness present.     Left Sinus: Frontal sinus tenderness present.     Mouth/Throat:     Mouth: Mucous membranes are moist.     Pharynx: Posterior oropharyngeal erythema present. No pharyngeal swelling, oropharyngeal exudate or uvula swelling.  Cardiovascular:     Rate and Rhythm: Normal rate and regular rhythm.  Pulmonary:     Effort: Pulmonary effort is normal.     Breath sounds: Normal breath sounds.  Musculoskeletal:        General: Normal range of motion.  Lymphadenopathy:     Cervical: No cervical adenopathy.  Skin:    General: Skin is warm and dry.  Neurological:     Mental Status: She is alert.  Psychiatric:        Mood and Affect: Mood normal.        Behavior: Behavior normal.    BP 132/84 (BP Location: Left Arm, Patient Position: Sitting, Cuff Size: Large)   Pulse 86   Temp 98.7 F (37.1 C) (Temporal)   Ht 5\' 4"  (1.626 m)   Wt  238 lb (108 kg)   SpO2 99%   BMI 40.85 kg/m  Wt Readings from Last 3 Encounters:  03/04/22 238 lb (108 kg)  01/20/22 243 lb (110.2 kg)  12/31/21 242 lb (109.8 kg)      Jeanie Sewer, NP

## 2022-03-05 ENCOUNTER — Other Ambulatory Visit (HOSPITAL_BASED_OUTPATIENT_CLINIC_OR_DEPARTMENT_OTHER): Payer: Self-pay

## 2022-03-06 ENCOUNTER — Other Ambulatory Visit: Payer: Self-pay | Admitting: Podiatry

## 2022-03-06 ENCOUNTER — Ambulatory Visit: Payer: 59 | Admitting: Family

## 2022-03-06 DIAGNOSIS — M2141 Flat foot [pes planus] (acquired), right foot: Secondary | ICD-10-CM

## 2022-03-10 ENCOUNTER — Encounter: Payer: Self-pay | Admitting: *Deleted

## 2022-03-22 ENCOUNTER — Other Ambulatory Visit: Payer: Self-pay | Admitting: Family Medicine

## 2022-03-24 ENCOUNTER — Other Ambulatory Visit (HOSPITAL_BASED_OUTPATIENT_CLINIC_OR_DEPARTMENT_OTHER): Payer: Self-pay

## 2022-03-24 MED ORDER — PROMETHAZINE HCL 25 MG PO TABS
25.0000 mg | ORAL_TABLET | Freq: Four times a day (QID) | ORAL | 0 refills | Status: DC | PRN
  Filled 2022-03-24: qty 21, 6d supply, fill #0

## 2022-03-25 ENCOUNTER — Other Ambulatory Visit (HOSPITAL_BASED_OUTPATIENT_CLINIC_OR_DEPARTMENT_OTHER): Payer: Self-pay

## 2022-03-25 MED ORDER — LITHIUM CARBONATE ER 300 MG PO TBCR
600.0000 mg | EXTENDED_RELEASE_TABLET | Freq: Every day | ORAL | 2 refills | Status: DC
  Filled 2022-03-25 – 2022-04-09 (×2): qty 60, 30d supply, fill #0
  Filled 2022-05-05 – 2022-06-06 (×2): qty 60, 30d supply, fill #1

## 2022-03-25 MED FILL — Pantoprazole Sodium EC Tab 20 MG (Base Equiv): ORAL | 90 days supply | Qty: 90 | Fill #0 | Status: CN

## 2022-04-03 ENCOUNTER — Other Ambulatory Visit (HOSPITAL_BASED_OUTPATIENT_CLINIC_OR_DEPARTMENT_OTHER): Payer: Self-pay

## 2022-04-03 MED ORDER — VRAYLAR 1.5 MG PO CAPS
1.5000 mg | ORAL_CAPSULE | ORAL | 0 refills | Status: DC
  Filled 2022-04-03: qty 30, fill #0
  Filled 2022-04-03 – 2022-04-09 (×2): qty 30, 30d supply, fill #0

## 2022-04-03 MED ORDER — BUSPIRONE HCL 15 MG PO TABS
15.0000 mg | ORAL_TABLET | Freq: Three times a day (TID) | ORAL | 1 refills | Status: DC
  Filled 2022-04-03: qty 90, 30d supply, fill #0
  Filled 2022-05-05: qty 90, 30d supply, fill #1
  Filled 2022-06-06: qty 90, 30d supply, fill #2
  Filled 2022-07-08: qty 90, 30d supply, fill #3
  Filled 2022-08-09: qty 90, 30d supply, fill #4
  Filled 2022-09-09: qty 90, 30d supply, fill #5

## 2022-04-03 MED ORDER — ESZOPICLONE 3 MG PO TABS
3.0000 mg | ORAL_TABLET | Freq: Every day | ORAL | 2 refills | Status: DC
  Filled 2022-04-03 – 2022-04-11 (×4): qty 30, 30d supply, fill #0
  Filled 2022-05-05 – 2022-05-10 (×2): qty 30, 30d supply, fill #1
  Filled 2022-06-06: qty 30, 30d supply, fill #2

## 2022-04-03 MED ORDER — LITHIUM CARBONATE ER 300 MG PO TBCR
600.0000 mg | EXTENDED_RELEASE_TABLET | Freq: Every day | ORAL | 0 refills | Status: DC
  Filled 2022-04-03: qty 60, 30d supply, fill #0
  Filled 2022-04-03: qty 180, 90d supply, fill #0
  Filled 2022-05-05: qty 60, 30d supply, fill #0
  Filled 2022-06-01: qty 60, 30d supply, fill #1

## 2022-04-03 MED ORDER — HYDROXYZINE PAMOATE 50 MG PO CAPS
50.0000 mg | ORAL_CAPSULE | Freq: Four times a day (QID) | ORAL | 1 refills | Status: DC | PRN
  Filled 2022-04-03: qty 120, 30d supply, fill #0
  Filled 2022-05-05: qty 120, 30d supply, fill #1
  Filled 2022-06-01: qty 120, 30d supply, fill #2
  Filled 2022-08-09: qty 120, 30d supply, fill #3
  Filled 2022-09-09: qty 60, 15d supply, fill #4

## 2022-04-04 ENCOUNTER — Other Ambulatory Visit (HOSPITAL_BASED_OUTPATIENT_CLINIC_OR_DEPARTMENT_OTHER): Payer: Self-pay

## 2022-04-08 ENCOUNTER — Other Ambulatory Visit: Payer: Self-pay | Admitting: Physical Medicine & Rehabilitation

## 2022-04-09 ENCOUNTER — Other Ambulatory Visit (HOSPITAL_BASED_OUTPATIENT_CLINIC_OR_DEPARTMENT_OTHER): Payer: Self-pay

## 2022-04-09 MED FILL — Pantoprazole Sodium EC Tab 20 MG (Base Equiv): ORAL | 30 days supply | Qty: 30 | Fill #0 | Status: AC

## 2022-04-11 ENCOUNTER — Other Ambulatory Visit (HOSPITAL_BASED_OUTPATIENT_CLINIC_OR_DEPARTMENT_OTHER): Payer: Self-pay

## 2022-04-12 ENCOUNTER — Other Ambulatory Visit (HOSPITAL_BASED_OUTPATIENT_CLINIC_OR_DEPARTMENT_OTHER): Payer: Self-pay

## 2022-04-21 ENCOUNTER — Other Ambulatory Visit (HOSPITAL_BASED_OUTPATIENT_CLINIC_OR_DEPARTMENT_OTHER): Payer: Self-pay

## 2022-04-24 ENCOUNTER — Other Ambulatory Visit (HOSPITAL_BASED_OUTPATIENT_CLINIC_OR_DEPARTMENT_OTHER): Payer: Self-pay

## 2022-04-30 ENCOUNTER — Other Ambulatory Visit: Payer: Self-pay | Admitting: Physical Medicine & Rehabilitation

## 2022-05-05 ENCOUNTER — Other Ambulatory Visit (HOSPITAL_BASED_OUTPATIENT_CLINIC_OR_DEPARTMENT_OTHER): Payer: Self-pay

## 2022-05-05 ENCOUNTER — Encounter (HOSPITAL_BASED_OUTPATIENT_CLINIC_OR_DEPARTMENT_OTHER): Payer: Self-pay | Admitting: Pharmacist

## 2022-05-05 MED FILL — Pantoprazole Sodium EC Tab 20 MG (Base Equiv): ORAL | 30 days supply | Qty: 30 | Fill #1 | Status: AC

## 2022-05-10 ENCOUNTER — Other Ambulatory Visit (HOSPITAL_BASED_OUTPATIENT_CLINIC_OR_DEPARTMENT_OTHER): Payer: Self-pay

## 2022-05-12 ENCOUNTER — Other Ambulatory Visit (HOSPITAL_BASED_OUTPATIENT_CLINIC_OR_DEPARTMENT_OTHER): Payer: Self-pay

## 2022-05-12 MED ORDER — VRAYLAR 1.5 MG PO CAPS
1.5000 mg | ORAL_CAPSULE | ORAL | 0 refills | Status: DC
  Filled 2022-05-12 – 2022-05-25 (×2): qty 30, 30d supply, fill #0

## 2022-05-13 ENCOUNTER — Other Ambulatory Visit (HOSPITAL_BASED_OUTPATIENT_CLINIC_OR_DEPARTMENT_OTHER): Payer: Self-pay

## 2022-05-15 ENCOUNTER — Other Ambulatory Visit: Payer: Self-pay | Admitting: Physical Medicine & Rehabilitation

## 2022-05-22 ENCOUNTER — Other Ambulatory Visit (HOSPITAL_BASED_OUTPATIENT_CLINIC_OR_DEPARTMENT_OTHER): Payer: Self-pay

## 2022-05-25 ENCOUNTER — Other Ambulatory Visit (HOSPITAL_BASED_OUTPATIENT_CLINIC_OR_DEPARTMENT_OTHER): Payer: Self-pay

## 2022-05-26 ENCOUNTER — Other Ambulatory Visit (HOSPITAL_BASED_OUTPATIENT_CLINIC_OR_DEPARTMENT_OTHER): Payer: Self-pay

## 2022-05-29 ENCOUNTER — Encounter: Payer: Self-pay | Admitting: *Deleted

## 2022-06-01 ENCOUNTER — Other Ambulatory Visit: Payer: Self-pay

## 2022-06-06 ENCOUNTER — Other Ambulatory Visit: Payer: Self-pay

## 2022-06-06 MED FILL — Pantoprazole Sodium EC Tab 20 MG (Base Equiv): ORAL | 30 days supply | Qty: 30 | Fill #2 | Status: AC

## 2022-06-10 ENCOUNTER — Other Ambulatory Visit (HOSPITAL_BASED_OUTPATIENT_CLINIC_OR_DEPARTMENT_OTHER): Payer: Self-pay

## 2022-06-13 ENCOUNTER — Other Ambulatory Visit (HOSPITAL_BASED_OUTPATIENT_CLINIC_OR_DEPARTMENT_OTHER): Payer: Self-pay

## 2022-06-14 ENCOUNTER — Other Ambulatory Visit (HOSPITAL_BASED_OUTPATIENT_CLINIC_OR_DEPARTMENT_OTHER): Payer: Self-pay

## 2022-06-14 MED ORDER — QUETIAPINE FUMARATE 25 MG PO TABS
25.0000 mg | ORAL_TABLET | Freq: Every evening | ORAL | 1 refills | Status: DC
  Filled 2022-06-14: qty 60, 30d supply, fill #0
  Filled 2022-07-08: qty 60, 30d supply, fill #1

## 2022-06-14 MED FILL — Nortriptyline HCl Cap 10 MG: ORAL | 30 days supply | Qty: 30 | Fill #0 | Status: AC

## 2022-06-22 ENCOUNTER — Other Ambulatory Visit: Payer: Self-pay | Admitting: Family Medicine

## 2022-06-22 DIAGNOSIS — G4762 Sleep related leg cramps: Secondary | ICD-10-CM

## 2022-06-24 ENCOUNTER — Other Ambulatory Visit (HOSPITAL_BASED_OUTPATIENT_CLINIC_OR_DEPARTMENT_OTHER): Payer: Self-pay

## 2022-06-24 MED ORDER — VRAYLAR 1.5 MG PO CAPS
1.5000 mg | ORAL_CAPSULE | ORAL | 4 refills | Status: DC
  Filled 2022-06-24: qty 30, 30d supply, fill #0
  Filled 2022-06-30 – 2022-07-02 (×2): qty 30, 30d supply, fill #1
  Filled 2022-08-24: qty 30, 30d supply, fill #2
  Filled 2022-09-21: qty 30, 30d supply, fill #3
  Filled 2022-10-17 – 2022-10-18 (×2): qty 30, 30d supply, fill #4

## 2022-06-24 MED ORDER — ZOLPIDEM TARTRATE 10 MG PO TABS
10.0000 mg | ORAL_TABLET | Freq: Every day | ORAL | 3 refills | Status: DC
  Filled 2022-06-24: qty 30, 30d supply, fill #0
  Filled 2022-06-30: qty 15, 15d supply, fill #0
  Filled 2022-07-08 – 2022-07-15 (×3): qty 30, 30d supply, fill #0
  Filled 2022-08-09 – 2022-08-11 (×2): qty 30, 30d supply, fill #1
  Filled 2022-09-09: qty 30, 30d supply, fill #2
  Filled 2022-10-10: qty 30, 30d supply, fill #3

## 2022-06-24 MED ORDER — LITHIUM CARBONATE ER 300 MG PO TBCR
600.0000 mg | EXTENDED_RELEASE_TABLET | Freq: Every day | ORAL | 0 refills | Status: DC
  Filled 2022-06-24: qty 60, 30d supply, fill #0
  Filled 2022-08-09: qty 60, 30d supply, fill #1
  Filled 2022-09-09: qty 60, 30d supply, fill #2

## 2022-06-24 MED ORDER — LAMOTRIGINE 150 MG PO TABS
300.0000 mg | ORAL_TABLET | Freq: Every day | ORAL | 3 refills | Status: DC
Start: 2022-06-24 — End: 2023-05-26
  Filled 2022-06-24: qty 60, 30d supply, fill #0
  Filled 2022-08-09: qty 60, 30d supply, fill #1
  Filled 2022-09-09: qty 60, 30d supply, fill #2

## 2022-06-25 ENCOUNTER — Other Ambulatory Visit (HOSPITAL_BASED_OUTPATIENT_CLINIC_OR_DEPARTMENT_OTHER): Payer: Self-pay

## 2022-06-30 ENCOUNTER — Other Ambulatory Visit (HOSPITAL_BASED_OUTPATIENT_CLINIC_OR_DEPARTMENT_OTHER): Payer: Self-pay

## 2022-07-01 ENCOUNTER — Other Ambulatory Visit (HOSPITAL_BASED_OUTPATIENT_CLINIC_OR_DEPARTMENT_OTHER): Payer: Self-pay

## 2022-07-02 ENCOUNTER — Encounter (HOSPITAL_BASED_OUTPATIENT_CLINIC_OR_DEPARTMENT_OTHER): Payer: Self-pay | Admitting: Pharmacist

## 2022-07-02 ENCOUNTER — Other Ambulatory Visit: Payer: Self-pay

## 2022-07-02 ENCOUNTER — Other Ambulatory Visit (HOSPITAL_BASED_OUTPATIENT_CLINIC_OR_DEPARTMENT_OTHER): Payer: Self-pay

## 2022-07-03 ENCOUNTER — Other Ambulatory Visit (HOSPITAL_BASED_OUTPATIENT_CLINIC_OR_DEPARTMENT_OTHER): Payer: Self-pay

## 2022-07-08 ENCOUNTER — Other Ambulatory Visit: Payer: Self-pay | Admitting: Family Medicine

## 2022-07-08 ENCOUNTER — Other Ambulatory Visit (HOSPITAL_BASED_OUTPATIENT_CLINIC_OR_DEPARTMENT_OTHER): Payer: Self-pay

## 2022-07-08 MED FILL — Nortriptyline HCl Cap 10 MG: ORAL | 30 days supply | Qty: 30 | Fill #1 | Status: AC

## 2022-07-09 ENCOUNTER — Other Ambulatory Visit (HOSPITAL_BASED_OUTPATIENT_CLINIC_OR_DEPARTMENT_OTHER): Payer: Self-pay

## 2022-07-09 ENCOUNTER — Other Ambulatory Visit: Payer: Self-pay

## 2022-07-09 MED ORDER — PANTOPRAZOLE SODIUM 20 MG PO TBEC
20.0000 mg | DELAYED_RELEASE_TABLET | Freq: Every day | ORAL | 1 refills | Status: DC
  Filled 2022-07-09: qty 90, 90d supply, fill #0

## 2022-07-14 ENCOUNTER — Other Ambulatory Visit (HOSPITAL_BASED_OUTPATIENT_CLINIC_OR_DEPARTMENT_OTHER): Payer: Self-pay

## 2022-07-15 ENCOUNTER — Other Ambulatory Visit (HOSPITAL_BASED_OUTPATIENT_CLINIC_OR_DEPARTMENT_OTHER): Payer: Self-pay

## 2022-07-16 ENCOUNTER — Other Ambulatory Visit (HOSPITAL_BASED_OUTPATIENT_CLINIC_OR_DEPARTMENT_OTHER): Payer: Self-pay

## 2022-07-16 ENCOUNTER — Telehealth: Payer: BC Managed Care – PPO | Admitting: Nurse Practitioner

## 2022-07-16 DIAGNOSIS — Z20828 Contact with and (suspected) exposure to other viral communicable diseases: Secondary | ICD-10-CM | POA: Diagnosis not present

## 2022-07-16 MED ORDER — OSELTAMIVIR PHOSPHATE 75 MG PO CAPS
75.0000 mg | ORAL_CAPSULE | Freq: Two times a day (BID) | ORAL | 0 refills | Status: AC
  Filled 2022-07-16: qty 10, 5d supply, fill #0

## 2022-07-16 NOTE — Progress Notes (Signed)
E visit for Flu like symptoms   We are sorry that you are not feeling well.  Here is how we plan to help! Based on what you have shared with me it looks like you may have a respiratory virus that may be influenza.  Influenza or "the flu" is   an infection caused by a respiratory virus. The flu virus is highly contagious and persons who did not receive their yearly flu vaccination may "catch" the flu from close contact.  We have anti-viral medications to treat the viruses that cause this infection. They are not a "cure" and only shorten the course of the infection. These prescriptions are most effective when they are given within the first 2 days of "flu" symptoms. Antiviral medication are indicated if you have a high risk of complications from the flu. You should  also consider an antiviral medication if you are in close contact with someone who is at risk. These medications can help patients avoid complications from the flu  but have side effects that you should know. Possible side effects from Tamiflu or oseltamivir include nausea, vomiting, diarrhea, dizziness, headaches, eye redness, sleep problems or other respiratory symptoms. You should not take Tamiflu if you have an allergy to oseltamivir or any to the ingredients in Tamiflu.  Based upon your symptoms and potential risk factors I have prescribed Oseltamivir (Tamiflu).  It has been sent to your designated pharmacy.  You will take one 75 mg capsule orally twice a day for the next 5 days.  ANYONE WHO HAS FLU SYMPTOMS SHOULD: . Stay home. The flu is highly contagious and going out or to work exposes others! . Be sure to drink plenty of fluids. Water is fine as well as fruit juices, sodas and electrolyte beverages. You may want to stay away from caffeine or alcohol. If you are nauseated, try taking small sips of liquids. How do you know if you are getting enough fluid? Your urine should be a pale yellow or almost colorless. . Get rest. . Taking a  steamy shower or using a humidifier may help nasal congestion and ease sore throat pain. Using a saline nasal spray works much the same way. . Cough drops, hard candies and sore throat lozenges may ease your cough. . Line up a caregiver. Have someone check on you regularly.   GET HELP RIGHT AWAY IF: . You cannot keep down liquids or your medications. . You become short of breath . Your fell like you are going to pass out or loose consciousness. . Your symptoms persist after you have completed your treatment plan MAKE SURE YOU   Understand these instructions.  Will watch your condition.  Will get help right away if you are not doing well or get worse.  Your e-visit answers were reviewed by a board certified advanced clinical practitioner to complete your personal care plan.  Depending on the condition, your plan could have included both over the counter or prescription medications.  If there is a problem please reply  once you have received a response from your provider.  Your safety is important to us.  If you have drug allergies check your prescription carefully.    You can use MyChart to ask questions about today's visit, request a non-urgent call back, or ask for a work or school excuse for 24 hours related to this e-Visit. If it has been greater than 24 hours you will need to follow up with your provider, or enter a new e-Visit   two days asking about your experience.  I hope that your e-visit has been valuable and will speed your recovery. Thank you for using e-visits.   Meds ordered this encounter  Medications   oseltamivir (TAMIFLU) 75 MG capsule    Sig: Take 1 capsule (75 mg total) by mouth 2 (two) times daily for 5 days.    Dispense:  10 capsule    Refill:  0     I spent approximately 5 minutes reviewing the patient's history, current symptoms and coordinating their care today.   

## 2022-07-17 ENCOUNTER — Other Ambulatory Visit (HOSPITAL_BASED_OUTPATIENT_CLINIC_OR_DEPARTMENT_OTHER): Payer: Self-pay

## 2022-07-29 ENCOUNTER — Encounter: Payer: Self-pay | Admitting: Family Medicine

## 2022-08-04 ENCOUNTER — Encounter: Payer: Self-pay | Admitting: Family Medicine

## 2022-08-04 ENCOUNTER — Ambulatory Visit (INDEPENDENT_AMBULATORY_CARE_PROVIDER_SITE_OTHER): Payer: BC Managed Care – PPO | Admitting: Family Medicine

## 2022-08-04 VITALS — BP 130/80 | HR 80 | Temp 98.0°F | Ht 64.0 in | Wt 238.8 lb

## 2022-08-04 DIAGNOSIS — K911 Postgastric surgery syndromes: Secondary | ICD-10-CM

## 2022-08-04 DIAGNOSIS — F410 Panic disorder [episodic paroxysmal anxiety] without agoraphobia: Secondary | ICD-10-CM

## 2022-08-04 DIAGNOSIS — E161 Other hypoglycemia: Secondary | ICD-10-CM

## 2022-08-04 LAB — BASIC METABOLIC PANEL
BUN: 7 mg/dL (ref 6–23)
CO2: 24 mEq/L (ref 19–32)
Calcium: 9.3 mg/dL (ref 8.4–10.5)
Chloride: 106 mEq/L (ref 96–112)
Creatinine, Ser: 0.68 mg/dL (ref 0.40–1.20)
GFR: 110.69 mL/min (ref >60.00)
Glucose, Bld: 84 mg/dL (ref 70–99)
Potassium: 3.8 mEq/L (ref 3.5–5.1)
Sodium: 140 mEq/L (ref 135–145)

## 2022-08-04 LAB — HEMOGLOBIN A1C: Hgb A1c MFr Bld: 4.9 % (ref 4.6–6.5)

## 2022-08-04 LAB — TSH: TSH: 1.53 u[IU]/mL (ref 0.35–5.50)

## 2022-08-04 NOTE — Progress Notes (Signed)
Subjective  CC:  Chief Complaint  Patient presents with   Blood Sugar Problem    Pt stated that she has been seing a drop in her BS in the 40s at night and 70 in the day    HPI: [name] [name] is a 37 y.o. female who presents to the office today to address the problems listed above in the chief complaint. 37 year old female status post gastric bypass surgery in 2014 presents due to low blood sugars.  Started abruptly about a week ago.  [place] the airport, felt shaky lightheaded, mother recognized this could be a low blood sugar and treated her with her glucose tablets.  Within 10 to 15 minutes, she was back to her normal self.  She has been checking blood sugars since then, have been as low as 42.  She has been trying to increase glucose and protein levels but gets very anxious and worried when she feels shaky.  She has had associated panic attacks.  No prior history of dumping syndrome.  Diet is regular.  She has decreased the amount of juice that she typically drinks.  She is contacted [place] [facility] who referred her to dietary and nutrition management for this problem.  Assessment  1. Hyperinsulinemic hypoglycemia   2. Postsurgical dumping syndrome   3. 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  Avoid processed sugars, sweets.  Agree with nutrition referral.  Needs regular meals, high-protein.  Increase fibers etc.  Reassured.  Check lab work. Discussed how anxiety provoking these episodes are for her.  Reassured.  Follow up: For complete physical 09/10/2022  Orders Placed This Encounter  Procedures   Hemoglobin A1c   Insulin, random   Basic metabolic panel   TSH   No orders of the defined types were placed in this encounter.     I reviewed the patients updated PMH, FH, and SocHx.    Patient Active Problem List   Diagnosis Date Noted   Bipolar 2 disorder ([place]) 09/19/2019    Priority: High   Fibromyalgia 09/09/2019     Priority: High   Panic disorder 06/01/2019    Priority: High   Family history of colon cancer 05/16/2019    Priority: High   Major depression, recurrent, chronic ([place]) 05/16/2019    Priority: High   Insomnia due to other mental disorder 03/13/2019    Priority: High   Acquired iron deficiency anemia due to decreased absorption 02/22/2015    Priority: High   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    Priority: High   Vitamin B12 deficiency 06/01/2019    Priority: Medium    Pain in both lower extremities 11/21/2018    Priority: Medium    Nocturnal leg cramps 10/26/2018    Priority: Medium    Nephrolithiasis     Priority: Medium    Vitamin D deficiency 06/01/2019    Priority: Low   History of repair of hiatal hernia 10/18/2021   Dehydration 05/31/2021   Intussusception of small intestine ([place]) 05/03/2020   Plantar fasciitis 10/31/2019   Ischial bursitis of left side 07/19/2019   Current Meds  Medication Sig   acetaminophen (TYLENOL) 500 MG tablet Take 2 tablets (1,000 mg total) by mouth every 6 (six) hours as needed for moderate pain.   busPIRone (BUSPAR) 15 MG tablet Take 1 tablet (15 mg total) by mouth 3 (three) times daily with food.   cariprazine (VRAYLAR) 1.5  MG capsule Take 1 capsule by mouth at bedtime   clotrimazole (MYCELEX) 10 MG troche Take 1 tablet (10 mg total) by mouth 5 (five) times daily.   cyanocobalamin (VITAMIN B12) 1000 MCG/ML injection Inject 1 mL (1,000 mcg total) into the muscle every 30 (thirty) days.   hydrOXYzine (VISTARIL) 50 MG capsule Take 1 capsule (50 mg total) by mouth every 6 (six) hours as needed.   lamoTRIgine (LAMICTAL) 150 MG tablet Take 2 tablets by mouth once a day (if out of lamictal more than 1 wk do not restart, call [phone])   lithium carbonate (LITHOBID) 300 MG CR tablet Take 2 tablets (600 mg total) by mouth at bedtime.   lithium carbonate (LITHOBID) 300 MG ER tablet Take 2 tablets (600 mg total) by mouth daily.    lithium carbonate (LITHOBID) 300 MG ER tablet TAKE 2 CAPSULES BY MOUTH EVERY DAY AT BEDTIME   nortriptyline (PAMELOR) 10 MG capsule Take 1 capsule (10 mg total) by mouth at bedtime.   pantoprazole (PROTONIX) 20 MG tablet Take 1 tablet (20 mg total) by mouth daily.   promethazine (PHENERGAN) 25 MG tablet Take 1 tablet (25 mg total) by mouth every 6 (six) hours as needed for nausea or vomiting.   SYRINGE-NEEDLE, DISP, 3 ML 25G X 1" 3 ML MISC Use 1 needle per application once montnly   tizanidine (ZANAFLEX) 2 MG capsule TAKE 2 CAPSULES (4 MG TOTAL) BY MOUTH 3 (THREE) TIMES DAILY AS NEEDED FOR MUSCLE SPASMS.   valACYclovir (VALTREX) 1000 MG tablet Take 2 tablets (2,000 mg total) by mouth 2 (two) times daily. Once, as needed for cold sores   zolpidem (AMBIEN) 10 MG tablet Take 1 tablet by mouth nightly at bedtime for insomnia (do not take within 30 minutes of eating)   Current Facility-Administered Medications for the 08/04/22 encounter (Office Visit) with Leamon Arnt, MD  Medication   triamcinolone acetonide (KENALOG) 10 MG/ML injection 10 mg    Allergies: Patient is allergic to nsaids, other, and keflex [cephalexin]. Family History: Patient family history includes Anxiety disorder in her father and mother; Bipolar disorder in her father; Cancer in her father; Colon cancer (age of onset: 17) in her father; Depression in her father and mother; Diabetes in her maternal grandfather; Hyperlipidemia in her father; Hypertension in her father, maternal grandmother, and mother; Kidney disease in her father; Obesity in her father and mother; Sleep apnea in her father; Uterine cancer (age of onset: 8) in her maternal grandmother. Social History:  Patient  reports that she has never smoked. She has never been exposed to tobacco smoke. She has never used smokeless tobacco. She reports that she does not currently use alcohol. She reports that she does not use drugs.  Review of Systems: Constitutional:  Negative for fever malaise or anorexia Cardiovascular: negative for chest pain Respiratory: negative for SOB or persistent cough Gastrointestinal: negative for abdominal pain  Objective  Vitals: BP 130/80   Pulse 80   Temp 98 F (36.7 C)   Ht 5' 4"$  (1.626 m)   Wt 238 lb 12.8 oz (108.3 kg)   SpO2 99%   BMI 40.99 kg/m  General: no acute distress , A&Ox3   Commons side effects, risks, benefits, and alternatives for medications and treatment plan prescribed today were discussed, and the patient expressed understanding of the given instructions. Patient is instructed to call or message via MyChart if he/she has any questions or concerns regarding our treatment plan. No barriers to understanding were identified. We  discussed Red Flag symptoms and signs in detail. Patient expressed understanding regarding what to do in case of urgent or emergency type symptoms.  Medication list was reconciled, printed and provided to the patient in AVS. Patient instructions and summary information was reviewed with the patient as documented in the AVS. This note was prepared with assistance of Dragon voice recognition software. Occasional wrong-word or sound-a-like substitutions may have occurred due to the inherent limitations of voice recognition software

## 2022-08-04 NOTE — Patient Instructions (Signed)
Please follow up as scheduled for your next visit with me: 09/10/2022   If you have any questions or concerns, please don't hesitate to send me a message via MyChart or call the office at [phone]. Thank you for visiting with [place] today! It's our pleasure caring for you.

## 2022-08-05 LAB — INSULIN, RANDOM: Insulin: 10.4 u[IU]/mL

## 2022-08-09 MED FILL — Nortriptyline HCl Cap 10 MG: ORAL | 30 days supply | Qty: 30 | Fill #2 | Status: AC

## 2022-08-10 ENCOUNTER — Other Ambulatory Visit (HOSPITAL_BASED_OUTPATIENT_CLINIC_OR_DEPARTMENT_OTHER): Payer: Self-pay

## 2022-08-11 ENCOUNTER — Other Ambulatory Visit: Payer: Self-pay

## 2022-08-12 ENCOUNTER — Other Ambulatory Visit: Payer: Self-pay

## 2022-08-12 ENCOUNTER — Other Ambulatory Visit (HOSPITAL_BASED_OUTPATIENT_CLINIC_OR_DEPARTMENT_OTHER): Payer: Self-pay

## 2022-09-08 ENCOUNTER — Encounter: Payer: BC Managed Care – PPO | Attending: Family Medicine | Admitting: Skilled Nursing Facility1

## 2022-09-08 ENCOUNTER — Encounter: Payer: Self-pay | Admitting: Skilled Nursing Facility1

## 2022-09-08 VITALS — Ht 64.0 in | Wt 238.0 lb

## 2022-09-08 DIAGNOSIS — Z713 Dietary counseling and surveillance: Secondary | ICD-10-CM | POA: Insufficient documentation

## 2022-09-08 DIAGNOSIS — E162 Hypoglycemia, unspecified: Secondary | ICD-10-CM | POA: Insufficient documentation

## 2022-09-08 DIAGNOSIS — Z9884 Bariatric surgery status: Secondary | ICD-10-CM | POA: Diagnosis not present

## 2022-09-08 DIAGNOSIS — Z6841 Body Mass Index (BMI) 40.0 and over, adult: Secondary | ICD-10-CM | POA: Insufficient documentation

## 2022-09-08 NOTE — Progress Notes (Signed)
Medical Nutrition Therapy   Primary concerns today: reactive hypoglycemia   Referral diagnosis: e16.2 Preferred learning style: visual Learning readiness:  contemplating   NUTRITION ASSESSMENT   Body Composition Scale 09/08/2022  Current Body Weight 238  Total Body Fat % 43.5  Visceral Fat 13  Fat-Free Mass % 56.4   Total Body Water % 42.7  Muscle-Mass lbs 32.3  BMI 40.7  Body Fat Displacement          Torso  lbs 64.1         Left Leg  lbs 12.8         Right Leg  lbs 12.8         Left Arm  lbs 6.4         Right Arm   lbs 6.4    Clinical Medical Hx: RYGB, GERD, Depression, TF when a hernia repair for 6 months, hyperlipidemia, HTN, anxiety, bipolar, fibromyalgia  Medications: see list Labs: A1C 4.9 Notable Signs/Symptoms: hypoglycemia often   Lifestyle & Dietary Hx  Pt sets her blood sugar drop every night to 33-50's typically her blood sugars are in the 80's stating when it is low she eats pudding a cheese sticks or drinks 2% milk. Having been occuring for about 3 months.  Pt states 2018 she had  blood sugar of 400.  Pt states she does not have a Tube Feed.  Pt states pineapple or supreme pizza will shoot her blood sugar up to the 200's and drop back down to the 60's.  Pt states she eats snacks out of boredom but states she is a stress eater.   Pt states she checks her blood sugar 4 times a day: before eating any meal (87) and if feeling funky and then bed time (73)  Estimated daily fluid intake: 50.7 oz Supplements: none Sleep: taking Ambien Stress / self-care: stress with work and hypoglycemia  Current average weekly physical activity: ADL's  24-Hr Dietary Recall First Meal: skipped or bacon sandwich bought out Snack:  Second Meal: white bread sandwich + bell peppers or hot dog [employer]  Snack: other half of sandwich  Third Meal: fast food or rotisierie chicken + mac n cheese Snack: cream cheese and bell pepper or gummy bears or granola custer  Beverages:  celsius, soda, 2% milk, sugar free gatorade, water   NUTRITION INTERVENTION  Nutrition education (E-1) on the following topics:  Reactive hypoglycemia Relationship with food and our choices   Handouts Provided Include  Mindful meals   Learning Style & Readiness for Change Teaching method utilized: Visual & Auditory  Demonstrated degree of understanding via: Teach Back  Barriers to learning/adherence to lifestyle change: anxiety  Goals Established by Pt Hypoglycemia: take 4 glucose tabs if a blood sugar under 70, then re-test after 15 minutes and when in the 90's have protein like cheese or peanut butter  Take the appropriate multivitamin daily Take 3 calcium citrate per day with at least 2 hours in between   No soda ever Do not drink 15 minutes before eating wait 30 minutes after before drinking again  Add in at least one more bottle of water per day Read your granola cluster  Do not take peoples leftover crap :) When the girls are arguing go for a walk instead of eat (if you are having a low blood sugar do NOT go walking) Keep a log of what and how much you eat along with what your blood sugars are Test 30 minutes-1 hour after eating  Ask your doctor for an endocrinologist referral for a CGM pro due to severity of lows   MONITORING & EVALUATION Dietary intake, weekly physical activity  Next Steps  Patient is to follow up in 3 weeks.

## 2022-09-09 ENCOUNTER — Encounter: Payer: Self-pay | Admitting: Family Medicine

## 2022-09-09 MED FILL — Nortriptyline HCl Cap 10 MG: ORAL | 30 days supply | Qty: 30 | Fill #3 | Status: AC

## 2022-09-10 ENCOUNTER — Other Ambulatory Visit: Payer: Self-pay

## 2022-09-10 ENCOUNTER — Other Ambulatory Visit: Payer: Self-pay | Admitting: Family Medicine

## 2022-09-10 ENCOUNTER — Other Ambulatory Visit (HOSPITAL_BASED_OUTPATIENT_CLINIC_OR_DEPARTMENT_OTHER): Payer: Self-pay

## 2022-09-10 ENCOUNTER — Encounter: Payer: 59 | Admitting: Family Medicine

## 2022-09-19 ENCOUNTER — Other Ambulatory Visit (HOSPITAL_BASED_OUTPATIENT_CLINIC_OR_DEPARTMENT_OTHER): Payer: Self-pay

## 2022-09-23 ENCOUNTER — Telehealth: Payer: Self-pay | Admitting: Family Medicine

## 2022-09-23 ENCOUNTER — Other Ambulatory Visit (HOSPITAL_BASED_OUTPATIENT_CLINIC_OR_DEPARTMENT_OTHER): Payer: Self-pay

## 2022-09-23 MED ORDER — CHLORHEXIDINE GLUCONATE 0.12 % MT SOLN
OROMUCOSAL | 0 refills | Status: DC
  Filled 2022-09-23: qty 473, 30d supply, fill #0

## 2022-09-23 MED ORDER — PREVIDENT 5000 SENSITIVE 1.1-5 % DT GEL
DENTAL | 2 refills | Status: DC
  Filled 2022-09-23: qty 100, 30d supply, fill #0

## 2022-09-23 NOTE — Telephone Encounter (Signed)
Pt would like a call back about the referral to Endo and the RX from the messages in March. Please advise.

## 2022-09-24 ENCOUNTER — Other Ambulatory Visit (HOSPITAL_BASED_OUTPATIENT_CLINIC_OR_DEPARTMENT_OTHER): Payer: Self-pay

## 2022-09-24 ENCOUNTER — Other Ambulatory Visit: Payer: Self-pay

## 2022-09-24 DIAGNOSIS — R739 Hyperglycemia, unspecified: Secondary | ICD-10-CM

## 2022-09-24 NOTE — Telephone Encounter (Signed)
Endo referral placed. Test strips ordered

## 2022-09-24 NOTE — Telephone Encounter (Signed)
No referral to endo listed in chart. OK to place referral? Please advise

## 2022-09-25 ENCOUNTER — Encounter: Payer: Self-pay | Admitting: Family Medicine

## 2022-09-26 ENCOUNTER — Other Ambulatory Visit: Payer: Self-pay

## 2022-09-26 ENCOUNTER — Other Ambulatory Visit (HOSPITAL_BASED_OUTPATIENT_CLINIC_OR_DEPARTMENT_OTHER): Payer: Self-pay

## 2022-09-26 DIAGNOSIS — E161 Other hypoglycemia: Secondary | ICD-10-CM

## 2022-09-26 DIAGNOSIS — R739 Hyperglycemia, unspecified: Secondary | ICD-10-CM

## 2022-09-26 MED ORDER — DEXCOM G6 TRANSMITTER MISC
3 refills | Status: DC
Start: 2022-09-26 — End: 2023-05-01
  Filled 2022-09-26 – 2022-10-18 (×3): qty 1, 30d supply, fill #0

## 2022-09-26 MED ORDER — DEXCOM G6 SENSOR MISC
3 refills | Status: DC
Start: 2022-09-26 — End: 2023-05-01
  Filled 2022-09-26: qty 3, 30d supply, fill #0
  Filled 2022-10-17: qty 3, 28d supply, fill #0
  Filled 2022-10-18: qty 3, 30d supply, fill #0
  Filled 2022-10-20: qty 3, 3d supply, fill #1
  Filled 2022-11-23: qty 3, 3d supply, fill #2

## 2022-09-26 MED ORDER — DEXCOM G6 RECEIVER DEVI
0 refills | Status: DC
Start: 2022-09-26 — End: 2022-12-08
  Filled 2022-09-26 – 2022-10-18 (×3): qty 1, 30d supply, fill #0

## 2022-09-26 MED ORDER — GLUCOSE BLOOD VI STRP
ORAL_STRIP | 12 refills | Status: DC
  Filled 2022-09-26: qty 100, 90d supply, fill #0
  Filled 2022-12-18: qty 100, 90d supply, fill #1
  Filled 2023-03-22 – 2023-04-10 (×2): qty 100, 90d supply, fill #2
  Filled 2023-07-08: qty 100, 90d supply, fill #3

## 2022-09-26 MED ORDER — DEXCOM G7 SENSOR MISC
5 refills | Status: DC
  Filled 2022-09-26 – 2022-11-26 (×2): qty 3, 30d supply, fill #0
  Filled 2023-02-11 – 2023-02-13 (×2): qty 3, 30d supply, fill #1
  Filled 2023-03-12: qty 3, 30d supply, fill #2
  Filled 2023-04-10: qty 3, 30d supply, fill #3
  Filled 2023-05-23 – 2023-05-25 (×2): qty 3, 30d supply, fill #4
  Filled 2023-06-22: qty 3, 30d supply, fill #5

## 2022-09-26 NOTE — Telephone Encounter (Signed)
Please order her test strips today as she has requested.  Verio flex machine

## 2022-09-26 NOTE — Telephone Encounter (Signed)
All orders has been placed.

## 2022-09-29 ENCOUNTER — Other Ambulatory Visit (HOSPITAL_BASED_OUTPATIENT_CLINIC_OR_DEPARTMENT_OTHER): Payer: Self-pay

## 2022-10-04 ENCOUNTER — Other Ambulatory Visit (HOSPITAL_BASED_OUTPATIENT_CLINIC_OR_DEPARTMENT_OTHER): Payer: Self-pay

## 2022-10-07 ENCOUNTER — Ambulatory Visit: Payer: BC Managed Care – PPO | Admitting: Skilled Nursing Facility1

## 2022-10-10 ENCOUNTER — Other Ambulatory Visit (HOSPITAL_BASED_OUTPATIENT_CLINIC_OR_DEPARTMENT_OTHER): Payer: Self-pay

## 2022-10-10 ENCOUNTER — Telehealth: Payer: BC Managed Care – PPO | Admitting: Family Medicine

## 2022-10-10 DIAGNOSIS — N390 Urinary tract infection, site not specified: Secondary | ICD-10-CM

## 2022-10-10 MED FILL — Nortriptyline HCl Cap 10 MG: ORAL | 30 days supply | Qty: 30 | Fill #4 | Status: AC

## 2022-10-11 ENCOUNTER — Other Ambulatory Visit (HOSPITAL_BASED_OUTPATIENT_CLINIC_OR_DEPARTMENT_OTHER): Payer: Self-pay

## 2022-10-11 MED ORDER — SULFAMETHOXAZOLE-TRIMETHOPRIM 800-160 MG PO TABS
1.0000 | ORAL_TABLET | Freq: Two times a day (BID) | ORAL | 0 refills | Status: AC
  Filled 2022-10-11: qty 10, 5d supply, fill #0

## 2022-10-11 NOTE — Progress Notes (Signed)
E-Visit for Urinary Problems  We are sorry that you are not feeling well.  Here is how we plan to help!  Based on what you shared with me it looks like you most likely have a simple urinary tract infection.  A UTI (Urinary Tract Infection) is a bacterial infection of the bladder.  Most cases of urinary tract infections are simple to treat but a key part of your care is to encourage you to drink plenty of fluids and watch your symptoms carefully.  I have prescribed Bactrim DS One tablet twice a day for 5 days.  Your symptoms should gradually improve. Call us if the burning in your urine worsens, you develop worsening fever, back pain or pelvic pain or if your symptoms do not resolve after completing the antibiotic.  Urinary tract infections can be prevented by drinking plenty of water to keep your body hydrated.  Also be sure when you wipe, wipe from front to back and don't hold it in!  If possible, empty your bladder every 4 hours.  HOME CARE Drink plenty of fluids Compete the full course of the antibiotics even if the symptoms resolve Remember, when you need to go.go. Holding in your urine can increase the likelihood of getting a UTI! GET HELP RIGHT AWAY IF: You cannot urinate You get a high fever Worsening back pain occurs You see blood in your urine You feel sick to your stomach or throw up You feel like you are going to pass out  MAKE SURE YOU  Understand these instructions. Will watch your condition. Will get help right away if you are not doing well or get worse.   Thank you for choosing an e-visit.  Your e-visit answers were reviewed by a board certified advanced clinical practitioner to complete your personal care plan. Depending upon the condition, your plan could have included both over the counter or prescription medications.  Please review your pharmacy choice. Make sure the pharmacy is open so you can pick up prescription now. If there is a problem, you may contact  your provider through MyChart messaging and have the prescription routed to another pharmacy.  Your safety is important to us. If you have drug allergies check your prescription carefully.   For the next 24 hours you can use MyChart to ask questions about today's visit, request a non-urgent call back, or ask for a work or school excuse. You will get an email in the next two days asking about your experience. I hope that your e-visit has been valuable and will speed your recovery.   have provided 5 minutes of non face to face time during this encounter for chart review and documentation.    

## 2022-10-12 ENCOUNTER — Other Ambulatory Visit (HOSPITAL_BASED_OUTPATIENT_CLINIC_OR_DEPARTMENT_OTHER): Payer: Self-pay

## 2022-10-13 ENCOUNTER — Other Ambulatory Visit: Payer: Self-pay

## 2022-10-13 ENCOUNTER — Other Ambulatory Visit (HOSPITAL_BASED_OUTPATIENT_CLINIC_OR_DEPARTMENT_OTHER): Payer: Self-pay

## 2022-10-13 MED ORDER — BUSPIRONE HCL 15 MG PO TABS
15.0000 mg | ORAL_TABLET | Freq: Three times a day (TID) | ORAL | 1 refills | Status: DC
  Filled 2022-10-13: qty 270, 90d supply, fill #0
  Filled 2023-05-11: qty 90, 30d supply, fill #1
  Filled 2023-07-08: qty 90, 30d supply, fill #2
  Filled 2023-08-06: qty 90, 30d supply, fill #3

## 2022-10-13 MED ORDER — LITHIUM CARBONATE ER 300 MG PO TBCR
600.0000 mg | EXTENDED_RELEASE_TABLET | Freq: Every day | ORAL | 0 refills | Status: DC
  Filled 2022-10-13: qty 180, 90d supply, fill #0

## 2022-10-13 MED ORDER — VRAYLAR 1.5 MG PO CAPS
1.5000 mg | ORAL_CAPSULE | Freq: Every day | ORAL | 4 refills | Status: DC
  Filled 2022-10-13 – 2022-11-11 (×3): qty 30, 30d supply, fill #0
  Filled 2022-12-15: qty 30, 30d supply, fill #1
  Filled 2023-02-27 – 2023-02-28 (×2): qty 30, 30d supply, fill #2
  Filled 2023-03-31: qty 30, 30d supply, fill #3
  Filled 2023-06-01: qty 30, 30d supply, fill #4

## 2022-10-13 MED ORDER — ZOLPIDEM TARTRATE 10 MG PO TABS
10.0000 mg | ORAL_TABLET | Freq: Every day | ORAL | 3 refills | Status: DC
  Filled 2022-10-13 – 2022-11-11 (×3): qty 30, 30d supply, fill #0

## 2022-10-13 MED ORDER — HYDROXYZINE PAMOATE 50 MG PO CAPS
50.0000 mg | ORAL_CAPSULE | Freq: Four times a day (QID) | ORAL | 1 refills | Status: DC | PRN
  Filled 2022-10-13: qty 270, 68d supply, fill #0
  Filled 2023-05-11: qty 120, 30d supply, fill #1
  Filled 2023-07-08: qty 120, 30d supply, fill #2
  Filled 2023-09-07: qty 30, 8d supply, fill #3

## 2022-10-13 MED ORDER — LAMOTRIGINE 150 MG PO TABS
300.0000 mg | ORAL_TABLET | Freq: Every day | ORAL | 1 refills | Status: DC
  Filled 2022-10-13: qty 180, 90d supply, fill #0

## 2022-10-17 ENCOUNTER — Other Ambulatory Visit (HOSPITAL_BASED_OUTPATIENT_CLINIC_OR_DEPARTMENT_OTHER): Payer: Self-pay

## 2022-10-18 ENCOUNTER — Other Ambulatory Visit (HOSPITAL_BASED_OUTPATIENT_CLINIC_OR_DEPARTMENT_OTHER): Payer: Self-pay

## 2022-10-19 ENCOUNTER — Encounter (HOSPITAL_BASED_OUTPATIENT_CLINIC_OR_DEPARTMENT_OTHER): Payer: Self-pay

## 2022-10-19 ENCOUNTER — Emergency Department (HOSPITAL_BASED_OUTPATIENT_CLINIC_OR_DEPARTMENT_OTHER)
Admission: EM | Admit: 2022-10-19 | Discharge: 2022-10-19 | Disposition: A | Discharge status: Home / Self Care | Payer: BC Managed Care – PPO | Attending: Emergency Medicine | Admitting: Emergency Medicine

## 2022-10-19 DIAGNOSIS — K911 Postgastric surgery syndromes: Secondary | ICD-10-CM | POA: Diagnosis not present

## 2022-10-19 DIAGNOSIS — K912 Postsurgical malabsorption, not elsewhere classified: Secondary | ICD-10-CM | POA: Diagnosis present

## 2022-10-19 DIAGNOSIS — E039 Hypothyroidism, unspecified: Secondary | ICD-10-CM | POA: Insufficient documentation

## 2022-10-19 DIAGNOSIS — I10 Essential (primary) hypertension: Secondary | ICD-10-CM | POA: Insufficient documentation

## 2022-10-19 HISTORY — DX: Hypoglycemia, unspecified: E16.2

## 2022-10-19 LAB — URINALYSIS, ROUTINE W REFLEX MICROSCOPIC
Bilirubin Urine: NEGATIVE
Glucose, UA: NEGATIVE mg/dL
Hgb urine dipstick: NEGATIVE
Ketones, ur: NEGATIVE mg/dL
Leukocytes,Ua: NEGATIVE
Nitrite: NEGATIVE
Protein, ur: NEGATIVE mg/dL
Specific Gravity, Urine: 1.024 (ref 1.005–1.030)
pH: 6 (ref 5.0–8.0)

## 2022-10-19 LAB — CBC WITH DIFFERENTIAL/PLATELET
Abs Immature Granulocytes: 0.05 10*3/uL (ref 0.00–0.07)
Basophils Absolute: 0.1 10*3/uL (ref 0.0–0.1)
Basophils Relative: 0 %
Eosinophils Absolute: 0.1 10*3/uL (ref 0.0–0.5)
Eosinophils Relative: 1 %
HCT: 38.3 % (ref 36.0–46.0)
Hemoglobin: 12.5 g/dL (ref 12.0–15.0)
Immature Granulocytes: 0 %
Lymphocytes Relative: 14 %
Lymphs Abs: 1.9 10*3/uL (ref 0.7–4.0)
MCH: 26.7 pg (ref 26.0–34.0)
MCHC: 32.6 g/dL (ref 30.0–36.0)
MCV: 81.7 fL (ref 80.0–100.0)
Monocytes Absolute: 0.8 10*3/uL (ref 0.1–1.0)
Monocytes Relative: 6 %
Neutro Abs: 10.5 10*3/uL — ABNORMAL HIGH (ref 1.7–7.7)
Neutrophils Relative %: 79 %
Platelets: 268 10*3/uL (ref 150–400)
RBC: 4.69 MIL/uL (ref 3.87–5.11)
RDW: 16.3 % — ABNORMAL HIGH (ref 11.5–15.5)
WBC: 13.4 10*3/uL — ABNORMAL HIGH (ref 4.0–10.5)
nRBC: 0 % (ref 0.0–0.2)

## 2022-10-19 LAB — COMPREHENSIVE METABOLIC PANEL
ALT: 9 U/L (ref 0–44)
AST: 13 U/L — ABNORMAL LOW (ref 15–41)
Albumin: 4.2 g/dL (ref 3.5–5.0)
Alkaline Phosphatase: 156 U/L — ABNORMAL HIGH (ref 38–126)
Anion gap: 9 (ref 5–15)
BUN: 14 mg/dL (ref 6–20)
CO2: 20 mmol/L — ABNORMAL LOW (ref 22–32)
Calcium: 8.7 mg/dL — ABNORMAL LOW (ref 8.9–10.3)
Chloride: 107 mmol/L (ref 98–111)
Creatinine, Ser: 1.04 mg/dL — ABNORMAL HIGH (ref 0.44–1.00)
GFR, Estimated: >60 mL/min (ref >60)
Glucose, Bld: 86 mg/dL (ref 70–99)
Potassium: 4 mmol/L (ref 3.5–5.1)
Sodium: 136 mmol/L (ref 135–145)
Total Bilirubin: 0.2 mg/dL — ABNORMAL LOW (ref 0.3–1.2)
Total Protein: 6.5 g/dL (ref 6.5–8.1)

## 2022-10-19 LAB — CBG MONITORING, ED
Glucose-Capillary: 69 mg/dL — ABNORMAL LOW (ref 70–99)
Glucose-Capillary: 83 mg/dL (ref 70–99)

## 2022-10-19 MED ORDER — SODIUM CHLORIDE 0.9 % IV BOLUS
1000.0000 mL | Freq: Once | INTRAVENOUS | Status: AC
  Administered 2022-10-19: 1000 mL via INTRAVENOUS

## 2022-10-19 NOTE — ED Provider Notes (Signed)
[place] [facility] AT [facility] Provider Note  CSN: [id number] Arrival date & time: 10/19/22 1607  Chief Complaint(s) Hypoglycemia in non-diabetic  HPI [name] [name] is a 37 y.o. female history of bipolar disorder, hypertension, prior gastric bypass presenting with episode of hypoglycemia.  The patient reports that for the past 6 months she has had episodes of hypoglycemia after eating sugary foods.  Thought to be related to her gastric bypass and dumping syndrome.  She has an appointment with an endocrinologist tomorrow.  She is not diabetic and does not use insulin.  She reports that today she ate a normal meal and then later this afternoon started to develop lightheadedness and fatigue, she has a CGM which read low.  She did not want to come to the hospital but her husband who is also going to the voice called her and told her to come to the hospital.  She took some glucose tablets with improvement in her blood sugar.  She reports she was recently treated for urinary infection and her urinary symptoms have resolved.  Denies any other acute symptoms such as cough, chest pain, shortness of breath, abdominal pain, headaches, nausea or vomiting.  Feels better currently.   Past Medical History Past Medical History:  Diagnosis Date   ADD (attention deficit disorder)    Anemia    Anxiety    Arthritis    B12 deficiency    Back pain    Bilateral swelling of feet    Bipolar 2 disorder (HCC) 09/19/2019   Constipation    DEPRESSION    Elevated cholesterol    Family history of colon cancer 2019/06/09   Father, 65s; deceased.    Fatty liver    Fibromyalgia    Gallbladder problem    GERD (gastroesophageal reflux disease)    Heartburn    History of kidney stones    History of stomach ulcers    Hypertension    Hypoglycemia    Hypothyroidism    Hx of, normalized TSH during pregnancy 2011   Infertility, female    Lactose intolerance    Migraines    Morbid obesity  (HCC)    s/p RY 08/2012 - start weight 290 pounds   Palpitations    Panic disorder    PCOS (polycystic ovarian syndrome)    Pneumonia    PONV (postoperative nausea and vomiting)    Pregnancy induced hypertension    Shortness of breath    Vitamin D deficiency    Patient Active Problem List   Diagnosis Date Noted   History of repair of hiatal hernia 10/18/2021   Dehydration 05/31/2021   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   Plantar fasciitis 10/31/2019   Bipolar 2 disorder (HCC) 09/19/2019   Fibromyalgia 09/09/2019   Ischial bursitis of left side 07/19/2019   Vitamin B12 deficiency 06/01/2019   Vitamin D deficiency 06/01/2019   Panic disorder 06/01/2019   Family history of colon cancer 2019-06-09   Major depression, recurrent, chronic (HCC) 2019-06-09   Insomnia due to other mental disorder 03/13/2019   Pain in both lower extremities 11/21/2018   Nocturnal leg cramps 10/26/2018   Nephrolithiasis    Acquired iron deficiency anemia due to decreased absorption 02/22/2015   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 Authorizing Provider  acetaminophen (TYLENOL) 500 MG tablet Take 2 tablets (1,000 mg total) by mouth every 6 (six) hours as  needed for moderate pain. 02/13/19   Fayrene Helper, PA-C  busPIRone (BUSPAR) 15 MG tablet Take 1 tablet (15 mg total) by mouth 3 (three) times daily with food. 04/03/22     busPIRone (BUSPAR) 15 MG tablet Take 1 tablet (15 mg total) by mouth 3 (three) times daily with meals. 10/13/22     cariprazine (VRAYLAR) 1.5 MG capsule Take 1 capsule by mouth at bedtime 06/24/22     cariprazine (VRAYLAR) 1.5 MG capsule Take 1 capsule (1.5 mg total) by mouth daily at bedtime 10/13/22     chlorhexidine (PERIDEX) 0.12 % solution Swish 1 tablespoon (15ml) in mouth undiluted for 30 seconds; then spit out. Use twice daily, morning and night after brushing  teeth. Use until gone 09/22/22     clotrimazole (MYCELEX) 10 MG troche Take 1 tablet (10 mg total) by mouth 5 (five) times daily. 01/27/22   Cathlyn Parsons, NP  Continuous Glucose Receiver (DEXCOM G6 RECEIVER) DEVI USE AS DIRECTED 09/26/22   Willow Ora, MD  Continuous Glucose Sensor (DEXCOM G6 SENSOR) MISC USE AS DIRECTED 09/26/22   Willow Ora, MD  Continuous Glucose Sensor (DEXCOM G7 SENSOR) MISC Apply one sensor every 10 days 09/26/22   Willow Ora, MD  Continuous Glucose Transmitter (DEXCOM G6 TRANSMITTER) MISC USE AS DIRECTED 09/26/22   Willow Ora, MD  cyanocobalamin (VITAMIN B12) 1000 MCG/ML injection Inject 1 mL (1,000 mcg total) into the muscle every 30 (thirty) days. 02/24/22   Willow Ora, MD  glucose blood test strip Use as instructed 09/26/22   Willow Ora, MD  hydrOXYzine (VISTARIL) 50 MG capsule Take 1 capsule (50 mg total) by mouth every 6 (six) hours as needed. 10/13/22     lamoTRIgine (LAMICTAL) 150 MG tablet Take 2 tablets by mouth once a day (if out of lamictal more than 1 wk do not restart, call [phone]) 06/24/22     lamoTRIgine (LAMICTAL) 150 MG tablet Take 2 tablets (300 mg total) by mouth daily.  (if out of lamictal more than 1 wk do not restart, call [phone]) 10/13/22     lithium carbonate (LITHOBID) 300 MG ER tablet Take 2 tablets (600 mg total) by mouth at bedtime. 10/13/22     nortriptyline (PAMELOR) 10 MG capsule Take 1 capsule (10 mg total) by mouth at bedtime. 05/01/22   Kirsteins, Victorino Sparrow, MD  pantoprazole (PROTONIX) 20 MG tablet Take 1 tablet (20 mg total) by mouth daily. 07/09/22   Willow Ora, MD  promethazine (PHENERGAN) 25 MG tablet Take 1 tablet (25 mg total) by mouth every 6 (six) hours as needed for nausea or vomiting. 03/24/22   Willow Ora, MD  Sod Fluoride-Potassium Nitrate (PREVIDENT 5000 SENSITIVE) 1.1-5 % GEL Use to Brush 2 to 3 times daily 09/22/22     SYRINGE-NEEDLE, DISP, 3 ML 25G X 1" 3 ML MISC Use 1 needle per application  once montnly 02/24/22   Willow Ora, MD  tizanidine (ZANAFLEX) 2 MG capsule TAKE 2 CAPSULES (4 MG TOTAL) BY MOUTH 3 (THREE) TIMES DAILY AS NEEDED FOR MUSCLE SPASMS. 06/24/22   Rodolph Bong, MD  valACYclovir (VALTREX) 1000 MG tablet Take 2 tablets (2,000 mg total) by mouth 2 (two) times daily. Once, as needed for cold sores 01/14/22   Willow Ora, MD  zolpidem (AMBIEN) 10 MG tablet Take 1 tablet by mouth nightly at bedtime for insomnia (do not take within 30 minutes of eating) 06/24/22     zolpidem (AMBIEN)  10 MG tablet Take 1 tablet (10 mg total) by mouth at bedtime.  (do not take within 30 minutes of eating) 10/13/22                                                                                                                                       Past Surgical History Past Surgical History:  Procedure Laterality Date   Quintella Reichert OSTEOTOMY Left 06/24/2017   Procedure: Quintella Reichert OSTEOTOMY;  Surgeon: [name], DPM;  Location: [facility];  Service: Podiatry;  Laterality: Left;   BIOPSY  07/04/2021   Procedure: BIOPSY;  Surgeon: Rachael Fee, MD;  Location: [facility];  Service: Endoscopy;;   BUNIONECTOMY Left 06/24/2017   Procedure: Anthoney Harada;  Surgeon: [name], DPM;  Location: [facility];  Service: Podiatry;  Laterality: Left;   CESAREAN SECTION     x 2   CHOLECYSTECTOMY N/A 02/15/2019   Procedure: LAPAROSCOPIC CHOLECYSTECTOMY WITH INTRAOPERATIVE CHOLANGIOGRAM;  Surgeon: Andria Meuse, MD;  Location: [facility];  Service: General;  Laterality: N/A;   COLON RESECTION N/A 05/04/2020   Procedure: DIAGNOSTIC LAPAROSCOPY, RESECTION OF CANDYCANE, UPPER ENDOSCOPY;  Surgeon: Berna Bue, MD;  Location: [facility];  Service: General;  Laterality: N/A;   ESOPHAGOGASTRODUODENOSCOPY (EGD) WITH PROPOFOL N/A 07/04/2021   Procedure: ESOPHAGOGASTRODUODENOSCOPY (EGD) WITH PROPOFOL;  Surgeon: Rachael Fee, MD;  Location: [facility];  Service:  Endoscopy;  Laterality: N/A;   FOOT SURGERY     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   Procedure: LAPAROSCOPIC ROUX-EN-Y GASTRIC;  Surgeon: Atilano Ina, MD;  Location: [facility];  Service: General;  Laterality: N/A;  laparoscopic roux-en-y gastric bypass   LITHOTRIPSY Left    TONSILLECTOMY     TUBAL LIGATION     UPPER GI ENDOSCOPY  08/24/2012   Procedure: UPPER GI ENDOSCOPY;  Surgeon: Atilano Ina, MD;  Location: [facility];  Service: General;;   WISDOM TOOTH EXTRACTION     XI ROBOTIC ASSISTED HIATAL HERNIA REPAIR N/A 10/18/2021   Procedure: XI ROBOTIC ASSISTED HIATAL HERNIA REPAIR WITH MESH UPPER ENDOSCOPY; ROBOTIC ASSISTED GASTROSTOMY TUBE PLACEMENT;  Surgeon: Gaynelle Adu, MD;  Location: [facility];  Service: General;  Laterality: N/A;   Family History Family History  Problem Relation Age of Onset   Hyperlipidemia Father    Hypertension Father    Kidney disease Father    Colon cancer Father 64   Cancer Father    Depression Father    Anxiety disorder Father    Bipolar disorder Father    Sleep apnea Father    Obesity Father    Hypertension Mother    Depression Mother    Anxiety disorder Mother    Obesity Mother    Hypertension Maternal Grandmother    Uterine cancer Maternal Grandmother 4   Diabetes Maternal Grandfather     Social History Social History   Tobacco Use  Smoking status: Never    Passive exposure: Never   Smokeless tobacco: Never  Vaping Use   Vaping Use: Never used  Substance Use Topics   Alcohol use: Not Currently    Comment: rare   Drug use: No   Allergies Nsaids, Other, and Keflex [cephalexin]  Review of Systems Review of Systems  All other systems reviewed and are negative.   Physical Exam Vital Signs  I have reviewed the triage vital signs BP 95/64   Pulse 89   Temp 98.2 F (36.8 C) (Oral)   Resp (!) 25   LMP 09/24/2022   SpO2 95%  Physical Exam Vitals and nursing note reviewed.  Constitutional:      General: She is not in acute distress.     Appearance: She is well-developed.  HENT:     Head: Normocephalic and atraumatic.     Mouth/Throat:     Mouth: Mucous membranes are moist.  Eyes:     Pupils: Pupils are equal, round, and reactive to light.  Cardiovascular:     Rate and Rhythm: Normal rate and regular rhythm.     Heart sounds: No murmur heard. Pulmonary:     Effort: Pulmonary effort is normal. No respiratory distress.     Breath sounds: Normal breath sounds.  Abdominal:     General: Abdomen is flat.     Palpations: Abdomen is soft.     Tenderness: There is no abdominal tenderness.  Musculoskeletal:        General: No tenderness.     Right lower leg: No edema.     Left lower leg: No edema.  Skin:    General: Skin is warm and dry.  Neurological:     General: No focal deficit present.     Mental Status: She is alert. Mental status is at baseline.  Psychiatric:        Mood and Affect: Mood normal.        Behavior: Behavior normal.     ED Results and Treatments Labs (all labs ordered are listed, but only abnormal results are displayed) Labs Reviewed  COMPREHENSIVE METABOLIC PANEL - Abnormal; Notable for the following components:      Result Value   CO2 20 (*)    Creatinine, Ser 1.04 (*)    Calcium 8.7 (*)    AST 13 (*)    Alkaline Phosphatase 156 (*)    Total Bilirubin 0.2 (*)    All other components within normal limits  CBC WITH DIFFERENTIAL/PLATELET - Abnormal; Notable for the following components:   WBC 13.4 (*)    RDW 16.3 (*)    Neutro Abs 10.5 (*)    All other components within normal limits  CBG MONITORING, ED - Abnormal; Notable for the following components:   Glucose-Capillary 69 (*)    All other components within normal limits  URINALYSIS, ROUTINE W REFLEX MICROSCOPIC  CBG MONITORING, ED  Radiology No results found.  Pertinent labs & imaging results that were  available during my care of the patient were reviewed by me and considered in my medical decision making (see MDM for details).  Medications Ordered in ED Medications  sodium chloride 0.9 % bolus 1,000 mL (0 mLs Intravenous Stopped 10/19/22 1812)                                                                                                                                     Procedures Procedures  (including critical care time)  Medical Decision Making / ED Course   MDM:  37 year old female presenting to the emergency department for low blood sugar.  Initial blood sugar here is 69 which is borderline low.  Will give food here.  Suspect likely related to underlying dumping syndrome from prior gastric bypass.  Patient reports that this is lower than it usually gets and there is not a clear trigger for it such as eating processed or sugary foods.  Will check basic labs to evaluate for underlying cause such as electrolyte derangement, dehydration, persistent urinary infection or other process.  Will observe.  If patient remains stable likely can discharge with close outpatient follow-up with endocrinology tomorrow.  Clinical Course as of 10/19/22 Lajuana Carry Oct 19, 2022  1610 Patient observed for around 2 hours.  No recurrent hypoglycemia.  She feels well.  Labs are notable for mild dehydration.  Patient did receive IV fluids.  She has endocrinology follow-up tomorrow.  Discussed return precautions including any recurrent episodes.  Patient feels comfortable discharge and has CGM which will alert her if her blood sugar drops, she also has a care plan for herself should her blood sugar drop again, so I believe it is safe to be discharged. Will discharge patient to home. All questions answered. Patient comfortable with plan of discharge. Return precautions discussed with patient and specified on the after visit summary.  [WS]    Clinical Course User Index [WS] [name], MD      Additional history obtained:  -External records from outside source obtained and reviewed including: Chart review including previous notes, labs, imaging, consultation notes including prior PMD notes regarding her hypoglycemic episodes   Lab Tests: -I ordered, reviewed, and interpreted labs.   The pertinent results include:   Labs Reviewed  COMPREHENSIVE METABOLIC PANEL - Abnormal; Notable for the following components:      Result Value   CO2 20 (*)    Creatinine, Ser 1.04 (*)    Calcium 8.7 (*)    AST 13 (*)    Alkaline Phosphatase 156 (*)    Total Bilirubin 0.2 (*)    All other components within normal limits  CBC WITH DIFFERENTIAL/PLATELET - Abnormal; Notable for the following components:   WBC 13.4 (*)    RDW 16.3 (*)    Neutro Abs 10.5 (*)    All other components  within normal limits  CBG MONITORING, ED - Abnormal; Notable for the following components:   Glucose-Capillary 69 (*)    All other components within normal limits  URINALYSIS, ROUTINE W REFLEX MICROSCOPIC  CBG MONITORING, ED    Notable for nonspecific leukocytosis. Mild dehydration  EKG   EKG Interpretation  Date/Time:  Sunday Oct 19 2022 16:30:46 EDT Ventricular Rate:  103 PR Interval:  143 QRS Duration: 92 QT Interval:  355 QTC Calculation: 465 R Axis:   54 Text Interpretation: Sinus tachycardia Confirmed by Alvino Blood ([id number]) on 10/19/2022 4:40:59 PM         Medicines ordered and prescription drug management: Meds ordered this encounter  Medications   sodium chloride 0.9 % bolus 1,000 mL    -I have reviewed the patients home medicines and have made adjustments as needed    Cardiac Monitoring: The patient was maintained on a cardiac monitor.  I personally viewed and interpreted the cardiac monitored which showed an underlying rhythm of: NSR  Social Determinants of Health:  Diagnosis or treatment significantly limited by social determinants of health:  obesity   Reevaluation: After the interventions noted above, I reevaluated the patient and found that their symptoms have improved  Co morbidities that complicate the patient evaluation  Past Medical History:  Diagnosis Date   ADD (attention deficit disorder)    Anemia    Anxiety    Arthritis    B12 deficiency    Back pain    Bilateral swelling of feet    Bipolar 2 disorder (HCC) 09/19/2019   Constipation    DEPRESSION    Elevated cholesterol    Family history of colon cancer 2019-05-18   Father, 41s; deceased.    Fatty liver    Fibromyalgia    Gallbladder problem    GERD (gastroesophageal reflux disease)    Heartburn    History of kidney stones    History of stomach ulcers    Hypertension    Hypoglycemia    Hypothyroidism    Hx of, normalized TSH during pregnancy 2011   Infertility, female    Lactose intolerance    Migraines    Morbid obesity (HCC)    s/p RY 08/2012 - start weight 290 pounds   Palpitations    Panic disorder    PCOS (polycystic ovarian syndrome)    Pneumonia    PONV (postoperative nausea and vomiting)    Pregnancy induced hypertension    Shortness of breath    Vitamin D deficiency       Dispostion: Disposition decision including need for hospitalization was considered, and patient discharged from emergency department.    Final Clinical Impression(s) / ED Diagnoses Final diagnoses:  Dumping syndrome  Hypoglycemia after GI (gastrointestinal) surgery     This chart was dictated using voice recognition software.  Despite best efforts to proofread,  errors can occur which can change the documentation meaning.    [name], MD 10/19/22 [phone]

## 2022-10-19 NOTE — ED Notes (Signed)
Discharge paperwork given and verbally understood. 

## 2022-10-19 NOTE — ED Triage Notes (Signed)
She c/o hypoglycemia issues (In the absense  of diabetes) x 4 months. She tells me she has had such profound hypoglycemia that her physician has her wearing a Dexcom continuous blood sugar monitor. She is here today with c/o blood sugar of 49 at home and feeling "tired and weird". She denies fever/cough, nor any other sign of current illness. She is drowsy and oriented x 4 with clear speech.

## 2022-10-19 NOTE — ED Notes (Signed)
Pt aware of the need for a urine... Unable to currently collect.... 

## 2022-10-19 NOTE — Discharge Instructions (Addendum)
We evaluated you for your low blood sugar.  Your low blood sugar was likely due to your history of gastric bypass.  You already have close follow-up with the endocrinologist tomorrow which is excellent.  Your laboratory testing did show mild dehydration, you received IV fluids.  Your blood sugar was stable in the emergency department.  Please follow-up closely with endocrinology tomorrow.  Continue to monitor your blood sugar with your CGM.  If you develop any recurrent episodes of very low blood sugar, please do your normal regimen of taking glucose tablets and eating.  If your blood sugar does drop low again, and does not improve with treatment, or you have any fainting episodes, severe weakness or lightheadedness, dizziness, or other concerning symptoms, please return immediately to the emergency department.

## 2022-10-19 NOTE — ED Notes (Signed)
Provider aware of V/S and was okay with Pt being discharged.

## 2022-10-20 ENCOUNTER — Other Ambulatory Visit (HOSPITAL_BASED_OUTPATIENT_CLINIC_OR_DEPARTMENT_OTHER): Payer: Self-pay

## 2022-10-20 MED ORDER — BAQSIMI TWO PACK 3 MG/DOSE NA POWD
NASAL | 1 refills | Status: DC
  Filled 2022-10-20 – 2023-07-08 (×2): qty 2, 30d supply, fill #0

## 2022-10-20 MED ORDER — ACARBOSE 50 MG PO TABS
50.0000 mg | ORAL_TABLET | Freq: Three times a day (TID) | ORAL | 1 refills | Status: DC
  Filled 2022-10-20: qty 90, 30d supply, fill #0
  Filled 2023-02-10: qty 90, 30d supply, fill #1

## 2022-10-21 ENCOUNTER — Other Ambulatory Visit (HOSPITAL_BASED_OUTPATIENT_CLINIC_OR_DEPARTMENT_OTHER): Payer: Self-pay

## 2022-10-22 ENCOUNTER — Telehealth: Payer: Self-pay

## 2022-10-22 ENCOUNTER — Other Ambulatory Visit (HOSPITAL_BASED_OUTPATIENT_CLINIC_OR_DEPARTMENT_OTHER): Payer: Self-pay

## 2022-10-22 NOTE — Transitions of Care (Post Inpatient/ED Visit) (Signed)
   10/22/2022  Name: [name] [name] MRN: [id number] DOB: 1985-07-23  Patient seen at endo 10/20/22. Declined OV with PCP at this time. Adela Glimpse, CMA

## 2022-11-09 ENCOUNTER — Other Ambulatory Visit (HOSPITAL_BASED_OUTPATIENT_CLINIC_OR_DEPARTMENT_OTHER): Payer: Self-pay

## 2022-11-11 ENCOUNTER — Other Ambulatory Visit (HOSPITAL_BASED_OUTPATIENT_CLINIC_OR_DEPARTMENT_OTHER): Payer: Self-pay

## 2022-11-26 ENCOUNTER — Other Ambulatory Visit (HOSPITAL_BASED_OUTPATIENT_CLINIC_OR_DEPARTMENT_OTHER): Payer: Self-pay

## 2022-11-27 ENCOUNTER — Other Ambulatory Visit (HOSPITAL_BASED_OUTPATIENT_CLINIC_OR_DEPARTMENT_OTHER): Payer: Self-pay

## 2022-12-08 ENCOUNTER — Ambulatory Visit: Payer: BC Managed Care – PPO | Admitting: Family Medicine

## 2022-12-08 ENCOUNTER — Encounter: Payer: Self-pay | Admitting: Family Medicine

## 2022-12-08 ENCOUNTER — Other Ambulatory Visit (HOSPITAL_BASED_OUTPATIENT_CLINIC_OR_DEPARTMENT_OTHER): Payer: Self-pay

## 2022-12-08 VITALS — BP 138/84 | HR 91 | Temp 98.3°F | Ht 64.0 in | Wt 239.0 lb

## 2022-12-08 DIAGNOSIS — R051 Acute cough: Secondary | ICD-10-CM | POA: Diagnosis not present

## 2022-12-08 DIAGNOSIS — B9689 Other specified bacterial agents as the cause of diseases classified elsewhere: Secondary | ICD-10-CM | POA: Diagnosis not present

## 2022-12-08 DIAGNOSIS — J208 Acute bronchitis due to other specified organisms: Secondary | ICD-10-CM | POA: Diagnosis not present

## 2022-12-08 LAB — POC COVID19 BINAXNOW: SARS Coronavirus 2 Ag: NEGATIVE

## 2022-12-08 MED ORDER — AZITHROMYCIN 250 MG PO TABS
ORAL_TABLET | ORAL | 0 refills | Status: DC
  Filled 2022-12-08: qty 6, 5d supply, fill #0

## 2022-12-08 MED ORDER — HYDROCODONE BIT-HOMATROP MBR 5-1.5 MG/5ML PO SOLN
5.0000 mL | Freq: Three times a day (TID) | ORAL | 0 refills | Status: DC | PRN
  Filled 2022-12-08: qty 120, 8d supply, fill #0

## 2022-12-08 NOTE — Progress Notes (Signed)
Subjective  CC:  Chief Complaint  Patient presents with   Cough    Pt stated that she has had a cough for the past week and feels it is getting worse. No COVID test has been performed    HPI: SUBJECTIVE:  [name] [name] is a 37 y.o. female who complains of congestion, nasal blockage, post nasal drip, cough described as painful and productive and denies sinus, high fevers, SOB, chest pain or significant GI symptoms. Symptoms have been present for 5-6 days. She denies a history of anorexia, dizziness, vomiting and wheezing. She denies a history of asthma or COPD. Patient does not smoke cigarettes.  Assessment  1. Acute bacterial bronchitis   2. Acute cough      Plan  Discussion:  Treat for bacterial bronchitis due to prolonged course and worsening symptoms. Education regarding differences between viral and bacterial infections and treatment options are discussed.  Supportive care measures are recommended.  We discussed the use of mucolytic's, decongestants, antihistamines and antitussives as needed.  Tylenol or Advil are recommended if needed.  Follow up: No follow-ups on file.   Orders Placed This Encounter  Procedures   POC COVID-19   Meds ordered this encounter  Medications   HYDROcodone bit-homatropine (HYCODAN) 5-1.5 MG/5ML syrup    Sig: Take 5 mLs by mouth every 8 (eight) hours as needed for cough.    Dispense:  120 mL    Refill:  0   azithromycin (ZITHROMAX) 250 MG tablet    Sig: Take 2 tablets by mouth today, then 1 tablet by mouth daily for 4 days    Dispense:  6 tablet    Refill:  0      I reviewed the patients updated PMH, FH, and SocHx.  Social History: Patient  reports that she has never smoked. She has never been exposed to tobacco smoke. She has never used smokeless tobacco. She reports that she does not currently use alcohol. She reports that she does not use drugs.  Patient Active Problem List   Diagnosis Date Noted   Bipolar 2 disorder (HCC)  09/19/2019    Priority: High   Fibromyalgia 09/09/2019    Priority: High   Panic disorder 06/01/2019    Priority: High   Family history of colon cancer 05/16/2019    Priority: High   Major depression, recurrent, chronic (HCC) 05/16/2019    Priority: High   Insomnia due to other mental disorder 03/13/2019    Priority: High   Acquired iron deficiency anemia due to decreased absorption 02/22/2015    Priority: High   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    Priority: High   Vitamin B12 deficiency 06/01/2019    Priority: Medium    Pain in both lower extremities 11/21/2018    Priority: Medium    Nocturnal leg cramps 10/26/2018    Priority: Medium    Nephrolithiasis     Priority: Medium    Vitamin D deficiency 06/01/2019    Priority: Low   History of repair of hiatal hernia 10/18/2021   Dehydration 05/31/2021   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   Plantar fasciitis 10/31/2019   Ischial bursitis of left side 07/19/2019    Review of Systems: Cardiovascular: negative for chest pain Respiratory: negative for SOB or hemoptysis Gastrointestinal: negative for abdominal pain Genitourinary: negative for dysuria or gross hematuria Current Meds  Medication Sig   acarbose (PRECOSE) 50 MG tablet Take 1 tablet (50 mg total) by mouth 3 (three)  times daily.   acetaminophen (TYLENOL) 500 MG tablet Take 2 tablets (1,000 mg total) by mouth every 6 (six) hours as needed for moderate pain.   azithromycin (ZITHROMAX) 250 MG tablet Take 2 tablets by mouth today, then 1 tablet by mouth daily for 4 days   busPIRone (BUSPAR) 15 MG tablet Take 1 tablet (15 mg total) by mouth 3 (three) times daily with meals.   cariprazine (VRAYLAR) 1.5 MG capsule Take 1 capsule (1.5 mg total) by mouth daily at bedtime   clotrimazole (MYCELEX) 10 MG troche Take 1 tablet (10 mg total) by mouth 5 (five) times daily.   Continuous Glucose Sensor (DEXCOM G6 SENSOR) MISC Use as directed every 10  days.   Continuous Glucose Sensor (DEXCOM G7 SENSOR) MISC Apply one sensor every 10 days   Continuous Glucose Transmitter (DEXCOM G6 TRANSMITTER) MISC USE AS DIRECTED   cyanocobalamin (VITAMIN B12) 1000 MCG/ML injection Inject 1 mL (1,000 mcg total) into the muscle every 30 (thirty) days.   Glucagon (BAQSIMI TWO PACK) 3 MG/DOSE POWD Use as directed as needed.   glucose blood test strip Use as instructed   HYDROcodone bit-homatropine (HYCODAN) 5-1.5 MG/5ML syrup Take 5 mLs by mouth every 8 (eight) hours as needed for cough.   hydrOXYzine (VISTARIL) 50 MG capsule Take 1 capsule (50 mg total) by mouth every 6 (six) hours as needed.   lamoTRIgine (LAMICTAL) 150 MG tablet Take 2 tablets by mouth once a day (if out of lamictal more than 1 wk do not restart, call [phone])   lamoTRIgine (LAMICTAL) 150 MG tablet Take 2 tablets (300 mg total) by mouth daily.  (if out of lamictal more than 1 wk do not restart, call [phone])   lithium carbonate (LITHOBID) 300 MG ER tablet Take 2 tablets (600 mg total) by mouth at bedtime.   nortriptyline (PAMELOR) 10 MG capsule Take 1 capsule (10 mg total) by mouth at bedtime.   pantoprazole (PROTONIX) 20 MG tablet Take 1 tablet (20 mg total) by mouth daily.   promethazine (PHENERGAN) 25 MG tablet Take 1 tablet (25 mg total) by mouth every 6 (six) hours as needed for nausea or vomiting.   Sod Fluoride-Potassium Nitrate (PREVIDENT 5000 SENSITIVE) 1.1-5 % GEL Use to Brush 2 to 3 times daily   tizanidine (ZANAFLEX) 2 MG capsule TAKE 2 CAPSULES (4 MG TOTAL) BY MOUTH 3 (THREE) TIMES DAILY AS NEEDED FOR MUSCLE SPASMS.   valACYclovir (VALTREX) 1000 MG tablet Take 2 tablets (2,000 mg total) by mouth 2 (two) times daily. Once, as needed for cold sores   zolpidem (AMBIEN) 10 MG tablet Take 1 tablet (10 mg total) by mouth at bedtime.  (do not take within 30 minutes of eating)    Objective  Vitals: BP 138/84   Pulse 91   Temp 98.3 F (36.8 C)   Ht 5\' 4"  (1.626 m)   Wt  239 lb (108.4 kg)   SpO2 100%   BMI 41.02 kg/m  General: no acute distress  Psych:  Alert and oriented, normal mood and affect HEENT:  Normocephalic, atraumatic, supple neck, moist mucous membranes, mildly erythematous pharynx without exudate, mild lymphadenopathy, supple neck Cardiovascular:  RRR without murmur. no edema Respiratory:  Good breath sounds bilaterally, CTAB with normal respiratory effort with occasional rhonchi Skin:  Warm, no rashes Neurologic:   Mental status is normal. normal gait Office Visit on 12/08/2022  Component Date Value Ref Range Status   SARS Coronavirus 2 Ag 12/08/2022 Negative  Negative Final  Commons side effects, risks, benefits, and alternatives for medications and treatment plan prescribed today were discussed, and the patient expressed understanding of the given instructions. Patient is instructed to call or message via MyChart if he/she has any questions or concerns regarding our treatment plan. No barriers to understanding were identified. We discussed Red Flag symptoms and signs in detail. Patient expressed understanding regarding what to do in case of urgent or emergency type symptoms.  Medication list was reconciled, printed and provided to the patient in AVS. Patient instructions and summary information was reviewed with the patient as documented in the AVS. This note was prepared with assistance of Dragon voice recognition software. Occasional wrong-word or sound-a-like substitutions may have occurred due to the inherent limitations of voice recognition software

## 2022-12-10 ENCOUNTER — Other Ambulatory Visit (HOSPITAL_BASED_OUTPATIENT_CLINIC_OR_DEPARTMENT_OTHER): Payer: Self-pay

## 2022-12-15 ENCOUNTER — Other Ambulatory Visit (HOSPITAL_BASED_OUTPATIENT_CLINIC_OR_DEPARTMENT_OTHER): Payer: Self-pay

## 2022-12-17 ENCOUNTER — Other Ambulatory Visit (HOSPITAL_BASED_OUTPATIENT_CLINIC_OR_DEPARTMENT_OTHER): Payer: Self-pay

## 2022-12-17 MED ORDER — LITHIUM CARBONATE ER 300 MG PO TBCR
600.0000 mg | EXTENDED_RELEASE_TABLET | Freq: Every day | ORAL | 0 refills | Status: DC
  Filled 2022-12-17: qty 60, 30d supply, fill #0

## 2022-12-19 ENCOUNTER — Other Ambulatory Visit: Payer: Self-pay

## 2022-12-22 ENCOUNTER — Other Ambulatory Visit: Payer: Self-pay | Admitting: Family Medicine

## 2022-12-22 DIAGNOSIS — G4762 Sleep related leg cramps: Secondary | ICD-10-CM

## 2022-12-22 NOTE — Telephone Encounter (Signed)
Last OV 11/15/20 Next OV not scheduled  Last refill 06/24/22 Qty #180/5  Pt has not been seen in > 1 year, needs OV

## 2022-12-23 MED FILL — Nortriptyline HCl Cap 10 MG: ORAL | 30 days supply | Qty: 30 | Fill #5 | Status: AC

## 2022-12-30 ENCOUNTER — Other Ambulatory Visit: Payer: Self-pay

## 2022-12-30 ENCOUNTER — Other Ambulatory Visit (HOSPITAL_BASED_OUTPATIENT_CLINIC_OR_DEPARTMENT_OTHER): Payer: Self-pay

## 2022-12-30 MED ORDER — ESZOPICLONE 3 MG PO TABS
3.0000 mg | ORAL_TABLET | Freq: Every evening | ORAL | 0 refills | Status: DC | PRN
  Filled 2022-12-30: qty 15, 15d supply, fill #0

## 2023-01-01 ENCOUNTER — Encounter: Payer: BC Managed Care – PPO | Admitting: Physical Medicine & Rehabilitation

## 2023-01-15 ENCOUNTER — Other Ambulatory Visit (HOSPITAL_BASED_OUTPATIENT_CLINIC_OR_DEPARTMENT_OTHER): Payer: Self-pay

## 2023-01-15 MED ORDER — ESZOPICLONE 3 MG PO TABS
3.0000 mg | ORAL_TABLET | Freq: Every evening | ORAL | 0 refills | Status: DC | PRN
  Filled 2023-01-15: qty 30, 30d supply, fill #0

## 2023-01-21 ENCOUNTER — Other Ambulatory Visit (HOSPITAL_BASED_OUTPATIENT_CLINIC_OR_DEPARTMENT_OTHER): Payer: Self-pay

## 2023-01-21 MED ORDER — LITHIUM CARBONATE ER 300 MG PO TBCR
600.0000 mg | EXTENDED_RELEASE_TABLET | Freq: Every day | ORAL | 1 refills | Status: DC
  Filled 2023-01-21 (×2): qty 180, 90d supply, fill #0
  Filled 2023-04-26: qty 60, 30d supply, fill #1
  Filled 2023-06-01: qty 60, 30d supply, fill #2
  Filled 2023-07-04 (×2): qty 60, 30d supply, fill #3
  Filled 2023-10-18: qty 60, 30d supply, fill #4
  Filled 2023-11-29: qty 60, 30d supply, fill #5
  Filled 2023-12-31: qty 60, 30d supply, fill #6

## 2023-01-21 MED ORDER — VRAYLAR 1.5 MG PO CAPS
1.5000 mg | ORAL_CAPSULE | ORAL | 4 refills | Status: DC
  Filled 2023-01-21: qty 30, 30d supply, fill #0
  Filled 2023-05-03: qty 30, 30d supply, fill #1

## 2023-01-21 MED ORDER — ESZOPICLONE 3 MG PO TABS
3.0000 mg | ORAL_TABLET | Freq: Every day | ORAL | 5 refills | Status: DC
  Filled 2023-01-21 – 2023-02-11 (×3): qty 30, 30d supply, fill #0
  Filled 2023-02-11: qty 15, 30d supply, fill #0
  Filled 2023-02-13: qty 30, 30d supply, fill #0
  Filled 2023-03-12: qty 30, 30d supply, fill #1
  Filled 2023-04-10 – 2023-04-11 (×2): qty 30, 30d supply, fill #2
  Filled 2023-05-11: qty 15, 15d supply, fill #3
  Filled 2023-05-23: qty 30, 30d supply, fill #4
  Filled 2023-07-18: qty 30, 30d supply, fill #5

## 2023-01-21 MED ORDER — LAMOTRIGINE 150 MG PO TABS
300.0000 mg | ORAL_TABLET | Freq: Every day | ORAL | 1 refills | Status: DC
  Filled 2023-01-21: qty 180, 90d supply, fill #0
  Filled 2023-06-27: qty 180, 90d supply, fill #1

## 2023-01-22 ENCOUNTER — Other Ambulatory Visit (HOSPITAL_BASED_OUTPATIENT_CLINIC_OR_DEPARTMENT_OTHER): Payer: Self-pay

## 2023-02-01 ENCOUNTER — Other Ambulatory Visit: Payer: Self-pay | Admitting: Family Medicine

## 2023-02-01 MED FILL — Nortriptyline HCl Cap 10 MG: ORAL | 30 days supply | Qty: 30 | Fill #6 | Status: AC

## 2023-02-02 ENCOUNTER — Other Ambulatory Visit (HOSPITAL_BASED_OUTPATIENT_CLINIC_OR_DEPARTMENT_OTHER): Payer: Self-pay

## 2023-02-02 ENCOUNTER — Other Ambulatory Visit: Payer: Self-pay

## 2023-02-02 MED ORDER — CYANOCOBALAMIN 1000 MCG/ML IJ SOLN
1000.0000 ug | INTRAMUSCULAR | 0 refills | Status: DC
  Filled 2023-02-02: qty 3, 90d supply, fill #0
  Filled 2023-05-09: qty 3, 90d supply, fill #1

## 2023-02-10 ENCOUNTER — Other Ambulatory Visit (HOSPITAL_BASED_OUTPATIENT_CLINIC_OR_DEPARTMENT_OTHER): Payer: Self-pay

## 2023-02-11 ENCOUNTER — Other Ambulatory Visit (HOSPITAL_BASED_OUTPATIENT_CLINIC_OR_DEPARTMENT_OTHER): Payer: Self-pay

## 2023-02-11 ENCOUNTER — Other Ambulatory Visit: Payer: Self-pay

## 2023-02-12 ENCOUNTER — Other Ambulatory Visit (HOSPITAL_BASED_OUTPATIENT_CLINIC_OR_DEPARTMENT_OTHER): Payer: Self-pay

## 2023-02-13 ENCOUNTER — Other Ambulatory Visit (HOSPITAL_BASED_OUTPATIENT_CLINIC_OR_DEPARTMENT_OTHER): Payer: Self-pay

## 2023-02-13 ENCOUNTER — Other Ambulatory Visit (HOSPITAL_COMMUNITY): Payer: Self-pay

## 2023-02-27 MED FILL — Nortriptyline HCl Cap 10 MG: ORAL | 30 days supply | Qty: 30 | Fill #7 | Status: CN

## 2023-02-28 ENCOUNTER — Other Ambulatory Visit (HOSPITAL_BASED_OUTPATIENT_CLINIC_OR_DEPARTMENT_OTHER): Payer: Self-pay

## 2023-02-28 MED FILL — Nortriptyline HCl Cap 10 MG: ORAL | 10 days supply | Qty: 10 | Fill #7 | Status: AC

## 2023-02-28 MED FILL — Nortriptyline HCl Cap 10 MG: ORAL | 20 days supply | Qty: 20 | Fill #7 | Status: AC

## 2023-03-14 ENCOUNTER — Other Ambulatory Visit (HOSPITAL_BASED_OUTPATIENT_CLINIC_OR_DEPARTMENT_OTHER): Payer: Self-pay

## 2023-03-14 MED ORDER — INFLUENZA VIRUS VACC SPLIT PF (FLUZONE) 0.5 ML IM SUSY
0.5000 mL | PREFILLED_SYRINGE | Freq: Once | INTRAMUSCULAR | 0 refills | Status: AC
  Filled 2023-03-14: qty 0.5, 1d supply, fill #0

## 2023-03-14 MED ORDER — COVID-19 MRNA VAC-TRIS(PFIZER) 30 MCG/0.3ML IM SUSY
0.3000 mL | PREFILLED_SYRINGE | Freq: Once | INTRAMUSCULAR | 0 refills | Status: AC
  Filled 2023-03-14: qty 0.3, 1d supply, fill #0

## 2023-03-31 MED FILL — Nortriptyline HCl Cap 10 MG: ORAL | 30 days supply | Qty: 30 | Fill #8 | Status: AC

## 2023-04-02 ENCOUNTER — Other Ambulatory Visit (HOSPITAL_BASED_OUTPATIENT_CLINIC_OR_DEPARTMENT_OTHER): Payer: Self-pay

## 2023-04-11 ENCOUNTER — Other Ambulatory Visit (HOSPITAL_BASED_OUTPATIENT_CLINIC_OR_DEPARTMENT_OTHER): Payer: Self-pay

## 2023-04-26 ENCOUNTER — Encounter: Payer: Self-pay | Admitting: Family Medicine

## 2023-04-27 ENCOUNTER — Other Ambulatory Visit (HOSPITAL_BASED_OUTPATIENT_CLINIC_OR_DEPARTMENT_OTHER): Payer: Self-pay

## 2023-04-27 ENCOUNTER — Other Ambulatory Visit: Payer: Self-pay

## 2023-05-01 ENCOUNTER — Ambulatory Visit: Payer: BC Managed Care – PPO | Admitting: Family Medicine

## 2023-05-01 ENCOUNTER — Other Ambulatory Visit (HOSPITAL_BASED_OUTPATIENT_CLINIC_OR_DEPARTMENT_OTHER): Payer: Self-pay

## 2023-05-01 ENCOUNTER — Encounter: Payer: Self-pay | Admitting: Family Medicine

## 2023-05-01 DIAGNOSIS — E161 Other hypoglycemia: Secondary | ICD-10-CM

## 2023-05-01 DIAGNOSIS — Z79899 Other long term (current) drug therapy: Secondary | ICD-10-CM | POA: Diagnosis not present

## 2023-05-01 DIAGNOSIS — Z9884 Bariatric surgery status: Secondary | ICD-10-CM | POA: Diagnosis not present

## 2023-05-01 DIAGNOSIS — R82998 Other abnormal findings in urine: Secondary | ICD-10-CM | POA: Insufficient documentation

## 2023-05-01 DIAGNOSIS — E162 Hypoglycemia, unspecified: Secondary | ICD-10-CM | POA: Insufficient documentation

## 2023-05-01 DIAGNOSIS — R7989 Other specified abnormal findings of blood chemistry: Secondary | ICD-10-CM | POA: Insufficient documentation

## 2023-05-01 MED ORDER — PHENTERMINE HCL 37.5 MG PO TABS
18.2500 mg | ORAL_TABLET | Freq: Every day | ORAL | 2 refills | Status: DC
  Filled 2023-05-01: qty 30, 30d supply, fill #0
  Filled 2023-05-29: qty 30, 30d supply, fill #1
  Filled 2023-06-27: qty 30, 30d supply, fill #2

## 2023-05-01 NOTE — Progress Notes (Signed)
Subjective  CC:  Chief Complaint  Patient presents with   Obesity    Pt is here to discuss weight loss medication.    HPI: [name] [name] is a 37 y.o. female who presents to the office today to address the problems listed above in the chief complaint. Discussed the use of AI scribe software for clinical note transcription with the patient, who gave verbal consent to proceed.  History of Present Illness   The patient, with a history of gastric surgery, presents for f/u on hypoglycemia and new concerns of weight gain. They report experiencing episodes of low blood sugar, which are currently being monitored through the [facility] app by their endocrinologist. The patient notes that they can now predict when these episodes are likely to occur. The endocrinologist suspects that these episodes may be due to insufficient protein intake, and the patient has since increased their protein consumption. due to h/o GI surgery.  also now with dx of diabetes by pt report treated with prn acarbose. need to request records.   The patient also reports a recent weight gain, which they attribute to their mood medications, specifically lithium and Vraylar. They express concern about returning to their pre-surgery weight. Despite making dietary changes, including increasing protein intake and reducing unhealthy carbohydrates, the patient has not observed any weight loss.  In addition to these concerns, the patient also reports intermittent depressive episodes. They describe having good days interspersed with bad days, during which they feel particularly low. Despite these challenges, the patient is actively trying to manage their mood and overall health. They have started walking regularly in their neighborhood and have made significant changes to their diet, including the incorporation of more protein and vegetables.  The patient also mentions a history of stomach issues, which have improved since their  gastric surgery. They deny any current stomach-related symptoms such as nausea or pain. Despite these improvements, the patient expresses frustration with their current health status, particularly their weight gain and hypoglycemia.   she would like to try phentermine.      Assessment  1. Morbid obesity (HCC)   2. History of Roux-en-Y gastric bypass 2014   3. Polypharmacy   4. 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 Managed with dietary adjustments and monitoring via Dexcom Clarity app. Endocrinologist prescribed acarbose to manage postprandial hyperglycemia and subsequent rapid drops in blood glucose levels. Discussed risks of severe hypoglycemia and benefits of dietary management and acarbose. Patient understands the need for regular monitoring and follow-up. - Continue monitoring blood glucose levels with Dexcom - Follow up with endocrinologist in December for further evaluation and blood work - Maintain high-protein diet to stabilize blood glucose levels - Use acarbose with meals as needed  Weight Gain Reports weight gain potentially related to psychiatric medications, particularly lithium and Vraylar. Despite dietary changes and increased physical activity, weight loss has not been achieved. Discussed potential use of phentermine as an appetite suppressant, with concerns about its impact on hypoglycemia, mood, blood pressure, and heart rate. Explained that phentermine is a short-term solution, typically used for 2-3 months, and may cause dry mouth, sleep disturbances, increased blood pressure, and heart rate. Emphasized the importance of monitoring and coordination with endocrinologist. - Prescribe phentermine with instructions to monitor for side effects such as dry mouth,  sleep disturbances, increased blood pressure, and heart rate - Contact Dr. Talmage Nap for approval before starting phentermine - Monitor blood pressure and heart rate regularly - Follow up in one month to assess response to phentermine and overall progress  Mood Disorder Experiences fluctuating mood with depressive episodes, managed with lithium, Vraylar, Pamelor, lamotrigine, and Buspar. Leafy Kindle has been effective in stabilizing mood but may contribute to weight gain. Discussed the balance between mood stabilization and weight management, and the importance of continuing current medications. - Continue current psychiatric medications - Monitor mood and any potential side effects from phentermine - Follow up with psychiatrist as needed  General Health Maintenance Significant dietary changes, including increased protein intake and reduced unhealthy carbs. Engages in regular physical activity by walking in the neighborhood. Discussed benefits of a whole food, plant-based diet and the importance of balanced meals to prevent hypoglycemia. - Encourage continuation of high-protein, low-fat diet with healthy snacks such as nuts and fibrous fruits - Recommend exploring whole food, plant-based diet for additional healthy eating options - Maintain regular physical activity  Follow-up - Follow up with endocrinologist in December - Follow up with primary care physician in one month - Monitor blood pressure and heart rate regularly - Contact Dr. Talmage Nap for approval of phentermine use.      Follow up: as scheduled 05/26/2023  No orders of the defined types were placed in this encounter.  Meds ordered this encounter  Medications   phentermine (ADIPEX-P) 37.5 MG tablet    Sig: Take 0.5-1 tablets (18.75-37.5 mg total) by mouth daily before breakfast.    Dispense:  30 tablet    Refill:  2      I reviewed the patients updated PMH, FH, and SocHx.    Patient Active Problem List   Diagnosis Date Noted    Bipolar 2 disorder (HCC) 09/19/2019    Priority: High   Fibromyalgia 09/09/2019    Priority: High   Panic disorder 06/01/2019    Priority: High   Family history of colon cancer 05/16/2019    Priority: High   Major depression, recurrent, chronic (HCC) 05/16/2019    Priority: High   Insomnia due to other mental disorder 03/13/2019    Priority: High   Acquired iron deficiency anemia due to decreased absorption 02/22/2015    Priority: High   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    Priority: High   Vitamin B12 deficiency 06/01/2019    Priority: Medium    Pain in both lower extremities 11/21/2018    Priority: Medium    Nocturnal leg cramps 10/26/2018    Priority: Medium    Nephrolithiasis     Priority: Medium    Vitamin D deficiency 06/01/2019    Priority: Low   Hypoglycemia 05/01/2023   Low serum cortisol level 05/01/2023   History of repair of hiatal hernia 10/18/2021   Plantar fasciitis 10/31/2019   Ischial bursitis of left side 07/19/2019   Current Meds  Medication Sig   acarbose (PRECOSE) 50 MG tablet Take 1 tablet (50 mg total) by mouth 3 (three) times daily.   acetaminophen (TYLENOL) 500 MG tablet Take 2 tablets (1,000 mg total) by mouth every 6 (six) hours as needed for moderate pain.   busPIRone (BUSPAR) 15 MG tablet Take 1 tablet (15 mg total) by mouth 3 (three) times daily with meals.   cariprazine (VRAYLAR) 1.5 MG capsule Take 1 capsule (1.5 mg total) by mouth daily at bedtime   cariprazine (VRAYLAR) 1.5 MG capsule Take 1  capsule (1.5 mg total) by mouth daily at bedtime   Continuous Glucose Sensor (DEXCOM G7 SENSOR) MISC Apply one sensor every 10 days   cyanocobalamin (VITAMIN B12) 1000 MCG/ML injection Inject 1 mL (1,000 mcg total) into the muscle every 30 (thirty) days.   Eszopiclone 3 MG TABS Take 1 tablet (3 mg total) by mouth at bedtime as needed for insomnia (do not eat within 30 minutes of taking)   Glucagon (BAQSIMI TWO PACK) 3 MG/DOSE POWD  Use as directed as needed.   glucose blood test strip Use as instructed   hydrOXYzine (VISTARIL) 50 MG capsule Take 1 capsule (50 mg total) by mouth every 6 (six) hours as needed.   lamoTRIgine (LAMICTAL) 150 MG tablet Take 2 tablets by mouth once a day (if out of lamictal more than 1 wk do not restart, call [phone])   lamoTRIgine (LAMICTAL) 150 MG tablet Take 2 tablets (300 mg total) by mouth daily.  (if out of lamictal more than 1 wk do not restart, call [phone])   lamoTRIgine (LAMICTAL) 150 MG tablet Take 2 tablets (300 mg total) by mouth daily. (if out of lamictal more than 1 wk do not restart, call [phone])   lithium carbonate (LITHOBID) 300 MG ER tablet Take 2 tablets (600 mg total) by mouth at bedtime.   lithium carbonate (LITHOBID) 300 MG ER tablet Take 2 tablets (600 mg total) by mouth at bedtime.   nortriptyline (PAMELOR) 10 MG capsule Take 1 capsule (10 mg total) by mouth at bedtime.   pantoprazole (PROTONIX) 20 MG tablet Take 1 tablet (20 mg total) by mouth daily.   phentermine (ADIPEX-P) 37.5 MG tablet Take 0.5-1 tablets (18.75-37.5 mg total) by mouth daily before breakfast.   Sod Fluoride-Potassium Nitrate (PREVIDENT 5000 SENSITIVE) 1.1-5 % GEL Use to Brush 2 to 3 times daily   valACYclovir (VALTREX) 1000 MG tablet Take 2 tablets (2,000 mg total) by mouth 2 (two) times daily. Once, as needed for cold sores    Allergies: Patient is allergic to nsaids, other, codeine, and keflex [cephalexin]. Family History: Patient family history includes Anxiety disorder in her father and mother; Bipolar disorder in her father; Cancer in her father; Colon cancer (age of onset: 35) in her father; Depression in her father and mother; Diabetes in her maternal grandfather; Hyperlipidemia in her father; Hypertension in her father, maternal grandmother, and mother; Kidney disease in her father; Obesity in her father and mother; Sleep apnea in her father; Uterine cancer (age of onset: 21) in  her maternal grandmother. Social History:  Patient  reports that she has never smoked. She has never been exposed to tobacco smoke. She has never used smokeless tobacco. She reports that she does not currently use alcohol. She reports that she does not use drugs.  Review of Systems: Constitutional: Negative for fever malaise or anorexia Cardiovascular: negative for chest pain Respiratory: negative for SOB or persistent cough Gastrointestinal: negative for abdominal pain  Objective  Vitals: BP 130/80 (BP Location: Left Arm, Patient Position: Sitting, Cuff Size: Large)   Pulse 87   Temp 98.3 F (36.8 C) (Temporal)   Ht 5\' 4"  (1.626 m)   Wt 254 lb 8 oz (115.4 kg)   SpO2 98%   BMI 43.68 kg/m  General: no acute distress , A&Ox3   Commons side effects, risks, benefits, and alternatives for medications and treatment plan prescribed today were discussed, and the patient expressed understanding of the given instructions. Patient is instructed to call or message via MyChart if  he/she has any questions or concerns regarding our treatment plan. No barriers to understanding were identified. We discussed Red Flag symptoms and signs in detail. Patient expressed understanding regarding what to do in case of urgent or emergency type symptoms.  Medication list was reconciled, printed and provided to the patient in AVS. Patient instructions and summary information was reviewed with the patient as documented in the AVS. This note was prepared with assistance of Dragon voice recognition software. Occasional wrong-word or sound-a-like substitutions may have occurred due to the inherent limitations of voice recognition software

## 2023-05-03 ENCOUNTER — Telehealth: Payer: BC Managed Care – PPO | Admitting: Family Medicine

## 2023-05-03 DIAGNOSIS — K649 Unspecified hemorrhoids: Secondary | ICD-10-CM | POA: Diagnosis not present

## 2023-05-03 MED ORDER — HYDROCORTISONE ACETATE 25 MG RE SUPP: 25.0000 mg | Freq: Two times a day (BID) | RECTAL | 0 refills | Status: DC

## 2023-05-03 NOTE — Progress Notes (Signed)

## 2023-05-09 ENCOUNTER — Other Ambulatory Visit (HOSPITAL_BASED_OUTPATIENT_CLINIC_OR_DEPARTMENT_OTHER): Payer: Self-pay

## 2023-05-12 ENCOUNTER — Other Ambulatory Visit (HOSPITAL_BASED_OUTPATIENT_CLINIC_OR_DEPARTMENT_OTHER): Payer: Self-pay

## 2023-05-12 ENCOUNTER — Other Ambulatory Visit: Payer: Self-pay

## 2023-05-25 ENCOUNTER — Other Ambulatory Visit (HOSPITAL_BASED_OUTPATIENT_CLINIC_OR_DEPARTMENT_OTHER): Payer: Self-pay

## 2023-05-26 ENCOUNTER — Encounter: Payer: Self-pay | Admitting: Family Medicine

## 2023-05-26 ENCOUNTER — Ambulatory Visit (INDEPENDENT_AMBULATORY_CARE_PROVIDER_SITE_OTHER): Payer: BC Managed Care – PPO | Admitting: Family Medicine

## 2023-05-26 ENCOUNTER — Other Ambulatory Visit (HOSPITAL_BASED_OUTPATIENT_CLINIC_OR_DEPARTMENT_OTHER): Payer: Self-pay

## 2023-05-26 VITALS — BP 128/88 | HR 96 | Temp 98.7°F | Ht 64.0 in | Wt 248.4 lb

## 2023-05-26 DIAGNOSIS — Z0000 Encounter for general adult medical examination without abnormal findings: Secondary | ICD-10-CM

## 2023-05-26 DIAGNOSIS — M797 Fibromyalgia: Secondary | ICD-10-CM | POA: Diagnosis not present

## 2023-05-26 DIAGNOSIS — Z9884 Bariatric surgery status: Secondary | ICD-10-CM | POA: Diagnosis not present

## 2023-05-26 DIAGNOSIS — Z0001 Encounter for general adult medical examination with abnormal findings: Secondary | ICD-10-CM

## 2023-05-26 DIAGNOSIS — Z79899 Other long term (current) drug therapy: Secondary | ICD-10-CM | POA: Insufficient documentation

## 2023-05-26 DIAGNOSIS — E161 Other hypoglycemia: Secondary | ICD-10-CM | POA: Diagnosis not present

## 2023-05-26 DIAGNOSIS — E559 Vitamin D deficiency, unspecified: Secondary | ICD-10-CM | POA: Diagnosis not present

## 2023-05-26 DIAGNOSIS — Z6841 Body Mass Index (BMI) 40.0 and over, adult: Secondary | ICD-10-CM

## 2023-05-26 DIAGNOSIS — Z1159 Encounter for screening for other viral diseases: Secondary | ICD-10-CM

## 2023-05-26 DIAGNOSIS — F3181 Bipolar II disorder: Secondary | ICD-10-CM

## 2023-05-26 DIAGNOSIS — F5105 Insomnia due to other mental disorder: Secondary | ICD-10-CM | POA: Diagnosis not present

## 2023-05-26 DIAGNOSIS — D508 Other iron deficiency anemias: Secondary | ICD-10-CM

## 2023-05-26 DIAGNOSIS — E538 Deficiency of other specified B group vitamins: Secondary | ICD-10-CM | POA: Diagnosis not present

## 2023-05-26 LAB — LIPID PANEL
Cholesterol: 194 mg/dL (ref 0–200)
HDL: 71.1 mg/dL (ref >39.00)
LDL Cholesterol: 108 mg/dL — ABNORMAL HIGH (ref 0–99)
NonHDL: 123.21
Total CHOL/HDL Ratio: 3
Triglycerides: 76 mg/dL (ref 0.0–149.0)
VLDL: 15.2 mg/dL (ref 0.0–40.0)

## 2023-05-26 LAB — CBC WITH DIFFERENTIAL/PLATELET
Basophils Absolute: 0 10*3/uL (ref 0.0–0.1)
Basophils Relative: 0.3 % (ref 0.0–3.0)
Eosinophils Absolute: 0.1 10*3/uL (ref 0.0–0.7)
Eosinophils Relative: 1.9 % (ref 0.0–5.0)
HCT: 37.1 % (ref 36.0–46.0)
Hemoglobin: 12.1 g/dL (ref 12.0–15.0)
Lymphocytes Relative: 22.1 % (ref 12.0–46.0)
Lymphs Abs: 1.5 10*3/uL (ref 0.7–4.0)
MCHC: 32.8 g/dL (ref 30.0–36.0)
MCV: 79.5 fL (ref 78.0–100.0)
Monocytes Absolute: 0.4 10*3/uL (ref 0.1–1.0)
Monocytes Relative: 6.1 % (ref 3.0–12.0)
Neutro Abs: 4.8 10*3/uL (ref 1.4–7.7)
Neutrophils Relative %: 69.6 % (ref 43.0–77.0)
Platelets: 338 10*3/uL (ref 150.0–400.0)
RBC: 4.66 Mil/uL (ref 3.87–5.11)
RDW: 15.7 % — ABNORMAL HIGH (ref 11.5–15.5)
WBC: 6.9 10*3/uL (ref 4.0–10.5)

## 2023-05-26 LAB — COMPREHENSIVE METABOLIC PANEL
ALT: 16 U/L (ref 0–35)
AST: 17 U/L (ref 0–37)
Albumin: 4.2 g/dL (ref 3.5–5.2)
Alkaline Phosphatase: 171 U/L — ABNORMAL HIGH (ref 39–117)
BUN: 12 mg/dL (ref 6–23)
CO2: 26 meq/L (ref 19–32)
Calcium: 8.9 mg/dL (ref 8.4–10.5)
Chloride: 106 meq/L (ref 96–112)
Creatinine, Ser: 0.72 mg/dL (ref 0.40–1.20)
GFR: 105.67 mL/min (ref >60.00)
Glucose, Bld: 82 mg/dL (ref 70–99)
Potassium: 4.1 meq/L (ref 3.5–5.1)
Sodium: 139 meq/L (ref 135–145)
Total Bilirubin: 0.2 mg/dL (ref 0.2–1.2)
Total Protein: 6.6 g/dL (ref 6.0–8.3)

## 2023-05-26 LAB — TSH: TSH: 2.05 u[IU]/mL (ref 0.35–5.50)

## 2023-05-26 LAB — B12 AND FOLATE PANEL
Folate: 11.2 ng/mL (ref >5.9)
Vitamin B-12: 372 pg/mL (ref 211–911)

## 2023-05-26 LAB — VITAMIN D 25 HYDROXY (VIT D DEFICIENCY, FRACTURES): VITD: 21.71 ng/mL — ABNORMAL LOW (ref 30.00–100.00)

## 2023-05-26 LAB — HEMOGLOBIN A1C: Hgb A1c MFr Bld: 5 % (ref 4.6–6.5)

## 2023-05-26 NOTE — Patient Instructions (Signed)
Please return in 6 mo to recheck weight and blood pressure  I will release your lab results to you on your MyChart account with further instructions. You may see the results before I do, but when I review them I will send you a message with my report or have my assistant call you if things need to be discussed. Please reply to my message with any questions. Thank you!   If you have any questions or concerns, please don't hesitate to send me a message via MyChart or call the office at [phone]. Thank you for visiting with [place] today! It's our pleasure caring for you.   Please do these things to maintain good health!  Exercise at least 30-45 minutes a day,  4-5 days a week.  Eat a low-fat diet with lots of fruits and vegetables, up to 7-9 servings per day. Drink plenty of water daily. Try to drink 8 8oz glasses per day. Seatbelts can save your life. Always wear your seatbelt. Place Smoke Detectors on every level of your home and check batteries every year. Schedule an appointment with an eye doctor for an eye exam every 1-2 years Safe sex - use condoms to protect yourself from STDs if you could be exposed to these types of infections. Use birth control if you do not want to become pregnant and are sexually active. Avoid heavy alcohol use. If you drink, keep it to less than 2 drinks/day and not every day. Health Care Power of Attorney.  Choose someone you trust that could speak for you if you became unable to speak for yourself. Depression is common in our stressful world.If you're feeling down or losing interest in things you normally enjoy, please come in for a visit. If anyone is threatening or hurting you, please get help. Physical or Emotional Violence is never OK.

## 2023-05-26 NOTE — Progress Notes (Signed)
Subjective  Chief Complaint  Patient presents with   Annual Exam    Pt here for Annual Exam and is currently fasting. Pt would like some labs added that psy requested   Depression    HPI: [name] [name] is a 37 y.o. female who presents to [facility] [place] today for a Female Wellness Visit.  She also has the concerns and/or needs as listed above in the chief complaint. These will be addressed in addition to the Health Maintenance Visit.   Wellness Visit: annual visit with health maintenance review and exam HM: screens are current. Pap NILM 2022. Due again next year.  She is doing very well.  Overall feeling well.  Immunizations are current. Chronic disease management visit and/or acute problem visit: Chronic medical conditions reviewed including depression and bipolar disorder being managed by psychiatry and reportedly stable on multiple meds.  Needs lab work high risk medications including lithium and antipsychotics. History of Roux-en-Y gastric bypass with history of vitamin deficiencies due for recheck. Now on phentermine for the last month or so and tolerating very well.  Is actually helped her maintain her glucose more stable.  She is eating regularly, small amounts and healthier foods.  She is down 8 pounds.  She is happy about this.  No adverse effects.  Sleeping well.  No hypertension. 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  Doing better as noted above. Fibromyalgia stable.  Assessment  1. Encounter for well adult exam with abnormal findings   2. Insomnia due to other mental disorder   3. History of Roux-en-Y gastric bypass 2014   4. Fibromyalgia   5. Acquired iron deficiency anemia due to decreased absorption   6. Vitamin B12 deficiency   7. Vitamin D deficiency   8. Hyperinsulinemic hypoglycemia   9. Need for hepatitis C screening test   10. Bipolar 2 disorder (HCC)   11. High risk medication use   12.  Morbid obesity [facility])      Plan  Female Wellness Visit: Age appropriate Health Maintenance and Prevention measures were discussed with patient. Included topics are cancer screening recommendations, ways to keep healthy (see AVS) including dietary and exercise recommendations, regular eye and dental care, use of seat belts, and avoidance of moderate alcohol use and tobacco use.  Screens are current BMI: discussed patient's BMI and encouraged positive lifestyle modifications to help get to or maintain a target BMI. HM needs and immunizations were addressed and ordered. See below for orders. See HM and immunization section for updates. Routine labs and screening tests ordered including cmp, cbc and lipids where appropriate. Discussed recommendations regarding Vit D and calcium supplementation (see AVS)  Chronic disease f/u and/or acute problem visit: (deemed necessary to be done in addition to the wellness visit): Mood disorder bipolar disorder: Improved and managed per psychiatry.  Will check lab work.  Monitor for thyroid problems, insulin resistance and check lithium level Fibromyalgia stable Morbid obesity: Doing well on phentermine 37.5 mg daily.  Further education on nutrition given today.  Continue healthy diet, do not overly restrict monitor blood pressure.  Will recheck in 6 months, sooner if any problems. Check vitamin B12 folate and vitamin D given history of gastric bypass.   Follow up: 6 months for weight recheck  Orders Placed This Encounter  Procedures   CBC with Differential/Platelet   Comp Met (CMET)   Lipid Profile   TSH   Lithium level   Hemoglobin A1c  B12 and Folate Panel   VITAMIN D 25 Hydroxy (Vit-D Deficiency, Fractures)   Iron, TIBC and Ferritin Panel   No orders of the defined types were placed in this encounter.      Body mass index is 42.64 kg/m. Wt Readings from Last 3 Encounters:  05/26/23 248 lb 6.4 oz (112.7 kg)  05/01/23 254 lb 8 oz (115.4 kg)   12/08/22 239 lb (108.4 kg)     Patient Active Problem List   Diagnosis Date Noted Date Diagnosed   Bipolar 2 disorder (HCC) 09/19/2019     Priority: High   Fibromyalgia 09/09/2019     Priority: High   Panic disorder 06/01/2019     Priority: High   Family history of colon cancer 05/16/2019     Priority: High    Father, 18s; deceased.     Major depression, recurrent, chronic (HCC) 05/16/2019     Priority: High   Insomnia due to other mental disorder 03/13/2019     Priority: High    Failed ambien - sleep walking, trazadone didn't work. Uses xanax at night for sleep.    Morbid obesity (HCC) 04/23/2017     Priority: High   Acquired iron deficiency anemia due to decreased absorption 02/22/2015     Priority: High   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     Priority: High    Yearly labs, s/p bariatric surgery: CBC, CMP, iron, ferritin, B12, PTH, B1, folate, zinc, copper.     Vitamin B12 deficiency 06/01/2019     Priority: Medium    Pain in both lower extremities 11/21/2018     Priority: Medium     May be due to vitamin deficiences: restarted supplements 12/20    Nocturnal leg cramps 10/26/2018     Priority: Medium    Nephrolithiasis      Priority: Medium    Vitamin D deficiency 06/01/2019     Priority: Low   Hyperinsulinemic hypoglycemia 05/26/2023    High risk medication use 05/26/2023    Hypoglycemia 05/01/2023    Low serum cortisol level 05/01/2023    History of repair of hiatal hernia 10/18/2021    Plantar fasciitis 10/31/2019    Ischial bursitis of left side 07/19/2019    Health Maintenance  Topic Date Due   Hepatitis C Screening  Never done   Cervical Cancer Screening (HPV/Pap Cotest)  03/06/2024   Colonoscopy  07/26/2025   DTaP/Tdap/Td (3 - Td or Tdap) 07/16/2026   INFLUENZA VACCINE  Completed   HIV Screening  Completed   HPV VACCINES  Aged Out   COVID-19 Vaccine  Discontinued   Immunization History  Administered Date(s) Administered    Hepb-cpg 11/09/2020, 12/14/2020   Influenza Split 03/17/2011   Influenza, Seasonal, Injecte, Preservative Fre 03/14/2023   Influenza,inj,Quad PF,6+ Mos 03/16/2013, 04/04/2016, 03/11/2019   Influenza-Unspecified 03/16/2014, 02/22/2017, 03/31/2018, 03/01/2020, 03/01/2021   PFIZER(Purple Top)SARS-COV-2 Vaccination 06/10/2019, 06/26/2019, 12/24/2019   PPD Test 08/24/2020   Pfizer(Comirnaty)Fall Seasonal Vaccine 12 years and older 03/14/2023   Td 06/16/2006   Tdap 07/16/2016   We updated and reviewed the patient's past history in detail and it is documented below. Allergies: Patient  reports that she does not currently use alcohol. Past Medical History Patient  has a past medical history of ADD (attention deficit disorder), Anemia, Anxiety, Arthritis, B12 deficiency, Back pain, Bilateral swelling of feet, Bipolar 2 disorder (HCC) (09/19/2019), Constipation, DEPRESSION, Elevated cholesterol, Family history of colon cancer (05/16/2019), Fatty liver, Fibromyalgia, Gallbladder problem, GERD (gastroesophageal reflux disease),  Heartburn, History of kidney stones, History of stomach ulcers, Hypertension, Hypoglycemia, Hypothyroidism, Infertility, female, Intussusception of small intestine (HCC) (05/03/2020), Lactose intolerance, Migraines, Morbid obesity (HCC), Palpitations, Panic disorder, PCOS (polycystic ovarian syndrome), Pneumonia, PONV (postoperative nausea and vomiting), Pregnancy induced hypertension, Shortness of breath, and Vitamin D deficiency. Past Surgical History Patient  has a past surgical history that includes Cesarean section; Tonsillectomy; Wisdom tooth extraction; Tubal ligation; Lithotripsy (Left); Gastric Roux-En-Y (N/A, 08/24/2012); Upper gi endoscopy (08/24/2012); Foot surgery; Bunionectomy (Left, 06/24/2017); [place] osteotomy (Left, 06/24/2017); Cholecystectomy (N/A, 02/15/2019); Colon resection (N/A, 05/04/2020); Esophagogastroduodenoscopy (egd) with propofol (N/A, 07/04/2021); biopsy  (07/04/2021); and Xi robotic assisted hiatal hernia repair (N/A, 10/18/2021). Social History   Socioeconomic History   Marital status: Married    Spouse name: Not on file   Number of children: Not on file   Years of education: Not on file   Highest education level: Bachelor's degree (e.g., BA, AB, BS)  Occupational History   Occupation: stay at home mom  Tobacco Use   Smoking status: Never    Passive exposure: Never   Smokeless tobacco: Never  Vaping Use   Vaping status: Never Used  Substance and Sexual Activity   Alcohol use: Not Currently    Comment: rare   Drug use: No   Sexual activity: Yes    Birth control/protection: Surgical, Other-see comments    Comment: Ablation  Other Topics Concern   Not on file  Social History Narrative   Married, lives with spouse and dtr born 07/2009. Employed as Associate Professor at [employer].   Social Determinants of Health   Financial Resource Strain: Medium Risk (04/27/2023)   Overall Financial Resource Strain (CARDIA)    Difficulty of Paying Living Expenses: Somewhat hard  Food Insecurity: Food Insecurity Present (04/27/2023)   Hunger Vital Sign    Worried About Running Out of Food in the Last Year: Sometimes true    Ran Out of Food in the Last Year: Sometimes true  Transportation Needs: No Transportation Needs (04/27/2023)   PRAPARE - Administrator, Civil Service (Medical): No    Lack of Transportation (Non-Medical): No  Physical Activity: Insufficiently Active (04/27/2023)   Exercise Vital Sign    Days of Exercise per Week: 5 days    Minutes of Exercise per Session: 20 min  Stress: Stress Concern Present (04/27/2023)   [employer] of Occupational Health - Occupational Stress Questionnaire    Feeling of Stress : Rather much  Social Connections: Socially Integrated (04/27/2023)   Social Connection and Isolation Panel [NHANES]    Frequency of Communication with Friends and Family: Twice a week    Frequency of Social  Gatherings with Friends and Family: Twice a week    Attends Religious Services: More than 4 times per year    Active Member of [employer] or Organizations: Yes    Attends Engineer, structural: More than 4 times per year    Marital Status: Married   Family History  Problem Relation Age of Onset   Hyperlipidemia Father    Hypertension Father    Kidney disease Father    Colon cancer Father 68   Cancer Father    Depression Father    Anxiety disorder Father    Bipolar disorder Father    Sleep apnea Father    Obesity Father    Arthritis Father    Early death Father    Hearing loss Father    Hypertension Mother    Depression Mother    Anxiety  disorder Mother    Obesity Mother    Arthritis Mother    Hypertension Maternal Grandmother    Uterine cancer Maternal Grandmother 43   Arthritis Maternal Grandmother    Depression Maternal Grandmother    Obesity Maternal Grandmother    Vision loss Maternal Grandmother    Varicose Veins Maternal Grandmother    Diabetes Maternal Grandfather    Arthritis Maternal Grandfather    Early death Maternal Grandfather    Hypertension Maternal Grandfather    Obesity Maternal Grandfather    Stroke Maternal Grandfather    Vision loss Maternal Grandfather    Depression Paternal Grandmother    ADD / ADHD Daughter    ADD / ADHD Daughter    Obesity Maternal Aunt    Obesity Maternal Uncle     Review of Systems: Constitutional: negative for fever or malaise Ophthalmic: negative for photophobia, double vision or loss of vision Cardiovascular: negative for chest pain, dyspnea on exertion, or new LE swelling Respiratory: negative for SOB or persistent cough Gastrointestinal: negative for abdominal pain, change in bowel habits or melena Genitourinary: negative for dysuria or gross hematuria, no abnormal uterine bleeding or disharge Musculoskeletal: negative for new gait disturbance or muscular weakness Integumentary: negative for new or persistent  rashes, no breast lumps Neurological: negative for TIA or stroke symptoms Psychiatric: negative for SI or delusions Allergic/Immunologic: negative for hives  Patient Care Team    Relationship Specialty Notifications Start End  Willow Ora, MD PCP - General Family Medicine  09/08/22   Jaymes Graff, MD Referring Physician Obstetrics and Gynecology  09/13/10   Himmelrich, Loree Fee, RD (Inactive) Dietitian Bariatrics  06/29/12   Hilts, Casimiro Needle, MD Consulting Physician Sports Medicine  05/16/19   Rachael Fee, MD Attending Physician Gastroenterology  05/16/19   Gaynelle Adu, MD Consulting Physician General Surgery  05/16/19   Dorisann Frames, MD Consulting Physician Endocrinology  12/08/22     Objective  Vitals: BP 128/88   Pulse 96   Temp 98.7 F (37.1 C)   Ht 5\' 4"  (1.626 m)   Wt 248 lb 6.4 oz (112.7 kg)   SpO2 98%   BMI 42.64 kg/m  General:  Well developed, well nourished, no acute distress  Psych:  Alert and orientedx3,normal mood and affect HEENT:  Normocephalic, atraumatic, non-icteric sclera, PERRL, supple neck without adenopathy, mass or thyromegaly Cardiovascular:  Normal S1, S2, RRR without gallop, rub or murmur Respiratory:  Good breath sounds bilaterally, CTAB with normal respiratory effort Gastrointestinal: normal bowel sounds, soft, non-tender, no noted masses. No HSM MSK: no deformities, contusions. Joints are without erythema or swelling.  Skin:  Warm, no rashes or suspicious lesions noted   Commons side effects, risks, benefits, and alternatives for medications and treatment plan prescribed today were discussed, and the patient expressed understanding of the given instructions. Patient is instructed to call or message via MyChart if he/she has any questions or concerns regarding our treatment plan. No barriers to understanding were identified. We discussed Red Flag symptoms and signs in detail. Patient expressed understanding regarding what to do in case of urgent  or emergency type symptoms.  Medication list was reconciled, printed and provided to the patient in AVS. Patient instructions and summary information was reviewed with the patient as documented in the AVS. This note was prepared with assistance of Dragon voice recognition software. Occasional wrong-word or sound-a-like substitutions may have occurred due to the inherent limitations of voice recognition software .

## 2023-05-27 LAB — IRON,TIBC AND FERRITIN PANEL
%SAT: 9 % — ABNORMAL LOW (ref 16–45)
Ferritin: 7 ng/mL — ABNORMAL LOW (ref 16–154)
Iron: 37 ug/dL — ABNORMAL LOW (ref 40–190)
TIBC: 405 ug/dL (ref 250–450)

## 2023-05-27 LAB — LITHIUM LEVEL: Lithium Lvl: 0.6 mmol/L (ref 0.6–1.2)

## 2023-05-28 ENCOUNTER — Other Ambulatory Visit (HOSPITAL_BASED_OUTPATIENT_CLINIC_OR_DEPARTMENT_OTHER): Payer: Self-pay

## 2023-05-28 MED ORDER — VITAMIN D (ERGOCALCIFEROL) 1.25 MG (50000 UNIT) PO CAPS
50000.0000 [IU] | ORAL_CAPSULE | ORAL | 0 refills | Status: AC
  Filled 2023-05-28: qty 12, 84d supply, fill #0

## 2023-05-28 NOTE — Progress Notes (Signed)
See mychart note Dear [name], Your lab results are back and look good overall, however your iron levels, vitamin D and B12 levels are all low.  You should be taking over-the-counter vitamin B12 1000 mcg daily, iron twice daily and vitamin D 4000 units daily.  I will order a high-dose vitamin D supplement to take weekly for 12 weeks to help rebuild your stores. Glad you are doing well overall Sincerely, Dr. Mardelle Matte

## 2023-05-28 NOTE — Addendum Note (Signed)
Addended by: Asencion Partridge on: 05/28/2023 02:08 PM   Modules accepted: Orders

## 2023-05-29 ENCOUNTER — Other Ambulatory Visit (HOSPITAL_BASED_OUTPATIENT_CLINIC_OR_DEPARTMENT_OTHER): Payer: Self-pay

## 2023-06-01 ENCOUNTER — Other Ambulatory Visit (HOSPITAL_BASED_OUTPATIENT_CLINIC_OR_DEPARTMENT_OTHER): Payer: Self-pay

## 2023-06-01 MED ORDER — METFORMIN HCL ER 500 MG PO TB24
500.0000 mg | ORAL_TABLET | Freq: Every day | ORAL | 3 refills | Status: DC
  Filled 2023-06-01: qty 30, 30d supply, fill #0
  Filled 2023-06-27: qty 30, 30d supply, fill #1
  Filled 2023-07-26: qty 30, 30d supply, fill #2
  Filled 2023-08-23: qty 30, 30d supply, fill #3

## 2023-06-02 ENCOUNTER — Other Ambulatory Visit: Payer: Self-pay

## 2023-06-06 ENCOUNTER — Other Ambulatory Visit (HOSPITAL_BASED_OUTPATIENT_CLINIC_OR_DEPARTMENT_OTHER): Payer: Self-pay

## 2023-06-06 ENCOUNTER — Encounter (HOSPITAL_BASED_OUTPATIENT_CLINIC_OR_DEPARTMENT_OTHER): Payer: Self-pay

## 2023-06-06 MED ORDER — ZEPBOUND 2.5 MG/0.5ML ~~LOC~~ SOAJ: 2.5000 mg | SUBCUTANEOUS | 0 refills | Status: DC

## 2023-06-08 ENCOUNTER — Encounter (HOSPITAL_BASED_OUTPATIENT_CLINIC_OR_DEPARTMENT_OTHER): Payer: Self-pay

## 2023-06-08 ENCOUNTER — Other Ambulatory Visit (HOSPITAL_BASED_OUTPATIENT_CLINIC_OR_DEPARTMENT_OTHER): Payer: Self-pay

## 2023-06-08 MED ORDER — TIRZEPATIDE-WEIGHT MANAGEMENT 2.5 MG/0.5ML ~~LOC~~ SOAJ
2.5000 mg | SUBCUTANEOUS | 0 refills | Status: DC
  Filled 2023-06-08: qty 2, 28d supply, fill #0

## 2023-06-11 ENCOUNTER — Other Ambulatory Visit (HOSPITAL_BASED_OUTPATIENT_CLINIC_OR_DEPARTMENT_OTHER): Payer: Self-pay

## 2023-06-19 ENCOUNTER — Other Ambulatory Visit (HOSPITAL_BASED_OUTPATIENT_CLINIC_OR_DEPARTMENT_OTHER): Payer: Self-pay

## 2023-06-19 MED ORDER — VRAYLAR 3 MG PO CAPS
3.0000 mg | ORAL_CAPSULE | Freq: Every day | ORAL | 4 refills | Status: DC
  Filled 2023-06-19: qty 30, 30d supply, fill #0
  Filled 2023-07-18: qty 30, 30d supply, fill #1
  Filled 2023-08-23: qty 30, 30d supply, fill #2
  Filled 2023-09-19: qty 30, 30d supply, fill #3
  Filled 2023-10-18: qty 30, 30d supply, fill #4

## 2023-06-22 ENCOUNTER — Other Ambulatory Visit (HOSPITAL_BASED_OUTPATIENT_CLINIC_OR_DEPARTMENT_OTHER): Payer: Self-pay

## 2023-06-22 MED ORDER — ESZOPICLONE 3 MG PO TABS
3.0000 mg | ORAL_TABLET | Freq: Every evening | ORAL | 1 refills | Status: DC | PRN
  Filled 2023-06-22 – 2023-06-23 (×2): qty 30, 30d supply, fill #0
  Filled 2023-07-21 – 2023-07-22 (×2): qty 30, 30d supply, fill #1

## 2023-06-23 ENCOUNTER — Other Ambulatory Visit: Payer: Self-pay

## 2023-06-23 ENCOUNTER — Other Ambulatory Visit (HOSPITAL_BASED_OUTPATIENT_CLINIC_OR_DEPARTMENT_OTHER): Payer: Self-pay

## 2023-06-29 ENCOUNTER — Other Ambulatory Visit (HOSPITAL_BASED_OUTPATIENT_CLINIC_OR_DEPARTMENT_OTHER): Payer: Self-pay

## 2023-07-04 ENCOUNTER — Other Ambulatory Visit (HOSPITAL_BASED_OUTPATIENT_CLINIC_OR_DEPARTMENT_OTHER): Payer: Self-pay

## 2023-07-04 ENCOUNTER — Encounter (HOSPITAL_BASED_OUTPATIENT_CLINIC_OR_DEPARTMENT_OTHER): Payer: Self-pay

## 2023-07-06 ENCOUNTER — Other Ambulatory Visit (HOSPITAL_BASED_OUTPATIENT_CLINIC_OR_DEPARTMENT_OTHER): Payer: Self-pay

## 2023-07-07 ENCOUNTER — Encounter: Payer: Self-pay | Admitting: Family Medicine

## 2023-07-09 ENCOUNTER — Other Ambulatory Visit (HOSPITAL_BASED_OUTPATIENT_CLINIC_OR_DEPARTMENT_OTHER): Payer: Self-pay

## 2023-07-13 ENCOUNTER — Other Ambulatory Visit: Payer: Self-pay | Admitting: Family Medicine

## 2023-07-14 ENCOUNTER — Other Ambulatory Visit (HOSPITAL_BASED_OUTPATIENT_CLINIC_OR_DEPARTMENT_OTHER): Payer: Self-pay

## 2023-07-14 MED ORDER — DEXCOM G7 SENSOR MISC
5 refills | Status: DC
  Filled 2023-07-14: qty 3, 30d supply, fill #0
  Filled 2023-09-07: qty 3, 30d supply, fill #1
  Filled 2023-10-18 – 2023-10-28 (×2): qty 3, 30d supply, fill #2

## 2023-07-16 ENCOUNTER — Other Ambulatory Visit (HOSPITAL_BASED_OUTPATIENT_CLINIC_OR_DEPARTMENT_OTHER): Payer: Self-pay

## 2023-07-16 MED ORDER — ONDANSETRON 4 MG PO TBDP
4.0000 mg | ORAL_TABLET | Freq: Three times a day (TID) | ORAL | 0 refills | Status: DC | PRN
  Filled 2023-07-16: qty 10, 28d supply, fill #0

## 2023-07-19 ENCOUNTER — Other Ambulatory Visit (HOSPITAL_BASED_OUTPATIENT_CLINIC_OR_DEPARTMENT_OTHER): Payer: Self-pay

## 2023-07-20 ENCOUNTER — Other Ambulatory Visit: Payer: Self-pay

## 2023-07-22 ENCOUNTER — Other Ambulatory Visit: Payer: Self-pay

## 2023-07-22 ENCOUNTER — Other Ambulatory Visit (HOSPITAL_BASED_OUTPATIENT_CLINIC_OR_DEPARTMENT_OTHER): Payer: Self-pay

## 2023-07-22 MED ORDER — BUSPIRONE HCL 15 MG PO TABS
15.0000 mg | ORAL_TABLET | Freq: Three times a day (TID) | ORAL | 1 refills | Status: DC
  Filled 2023-07-22 – 2023-10-18 (×3): qty 270, 90d supply, fill #0
  Filled 2024-02-04: qty 270, 90d supply, fill #1

## 2023-07-22 MED ORDER — ESZOPICLONE 3 MG PO TABS
3.0000 mg | ORAL_TABLET | Freq: Every evening | ORAL | 2 refills | Status: DC | PRN
  Filled 2023-07-22: qty 15, 15d supply, fill #0
  Filled 2023-08-18 – 2023-08-19 (×2): qty 30, 30d supply, fill #0
  Filled 2023-09-15: qty 30, 30d supply, fill #1
  Filled 2023-10-13: qty 30, 30d supply, fill #2

## 2023-07-22 MED ORDER — LITHIUM CARBONATE ER 300 MG PO TBCR
600.0000 mg | EXTENDED_RELEASE_TABLET | Freq: Every day | ORAL | 1 refills | Status: DC
  Filled 2023-07-22 – 2023-08-02 (×2): qty 180, 90d supply, fill #0
  Filled 2024-04-08: qty 180, 90d supply, fill #1

## 2023-07-22 MED ORDER — HYDROXYZINE PAMOATE 50 MG PO CAPS
50.0000 mg | ORAL_CAPSULE | Freq: Four times a day (QID) | ORAL | 1 refills | Status: DC | PRN
  Filled 2023-07-22 – 2023-10-18 (×2): qty 270, 68d supply, fill #0

## 2023-07-22 MED ORDER — LAMOTRIGINE 150 MG PO TABS
300.0000 mg | ORAL_TABLET | Freq: Every day | ORAL | 1 refills | Status: DC
  Filled 2023-07-22 – 2023-10-18 (×2): qty 180, 90d supply, fill #0
  Filled 2024-02-04: qty 180, 90d supply, fill #1

## 2023-07-22 MED ORDER — VRAYLAR 3 MG PO CAPS
3.0000 mg | ORAL_CAPSULE | Freq: Every day | ORAL | 4 refills | Status: DC
  Filled 2023-07-22 – 2023-11-22 (×2): qty 30, 30d supply, fill #0
  Filled 2023-12-26: qty 30, 30d supply, fill #1
  Filled 2024-02-04: qty 30, 30d supply, fill #2
  Filled 2024-03-03: qty 30, 30d supply, fill #3

## 2023-07-26 ENCOUNTER — Other Ambulatory Visit: Payer: Self-pay | Admitting: Family Medicine

## 2023-07-27 ENCOUNTER — Other Ambulatory Visit (HOSPITAL_BASED_OUTPATIENT_CLINIC_OR_DEPARTMENT_OTHER): Payer: Self-pay

## 2023-07-27 ENCOUNTER — Other Ambulatory Visit: Payer: Self-pay

## 2023-07-27 MED ORDER — PHENTERMINE HCL 37.5 MG PO TABS
18.2500 mg | ORAL_TABLET | Freq: Every day | ORAL | 2 refills | Status: DC
  Filled 2023-07-27: qty 30, 30d supply, fill #0
  Filled 2023-08-23: qty 30, 30d supply, fill #1
  Filled 2023-09-24: qty 30, 30d supply, fill #2

## 2023-07-27 NOTE — Telephone Encounter (Signed)
 05/26/2023 LOV  05/01/2023 fill Date  0/2 remaining

## 2023-08-03 ENCOUNTER — Other Ambulatory Visit (HOSPITAL_BASED_OUTPATIENT_CLINIC_OR_DEPARTMENT_OTHER): Payer: Self-pay

## 2023-08-06 ENCOUNTER — Other Ambulatory Visit (HOSPITAL_BASED_OUTPATIENT_CLINIC_OR_DEPARTMENT_OTHER): Payer: Self-pay

## 2023-08-06 ENCOUNTER — Encounter (HOSPITAL_BASED_OUTPATIENT_CLINIC_OR_DEPARTMENT_OTHER): Payer: Self-pay

## 2023-08-11 ENCOUNTER — Telehealth: Payer: 59 | Admitting: Physician Assistant

## 2023-08-11 ENCOUNTER — Other Ambulatory Visit (HOSPITAL_BASED_OUTPATIENT_CLINIC_OR_DEPARTMENT_OTHER): Payer: Self-pay

## 2023-08-11 DIAGNOSIS — M545 Low back pain, unspecified: Secondary | ICD-10-CM | POA: Diagnosis not present

## 2023-08-11 MED ORDER — METHYLPREDNISOLONE 4 MG PO TBPK
ORAL_TABLET | ORAL | 0 refills | Status: DC
  Filled 2023-08-11: qty 21, 6d supply, fill #0

## 2023-08-11 MED ORDER — CYCLOBENZAPRINE HCL 10 MG PO TABS
5.0000 mg | ORAL_TABLET | Freq: Three times a day (TID) | ORAL | 0 refills | Status: DC | PRN
  Filled 2023-08-11: qty 30, 10d supply, fill #0

## 2023-08-11 NOTE — Progress Notes (Signed)
 E-Visit for Back Pain   We are sorry that you are not feeling well.  Here is how we plan to help!  Based on what you have shared with me it looks like you mostly have acute back pain.  Acute back pain is defined as musculoskeletal pain that can resolve in 1-3 weeks with conservative treatment.  I have prescribed Medrol dose pack, a steroid anti-inflammatory, as well as Flexeril 10 mg every eight hours as needed which is a muscle relaxer  Some patients experience stomach irritation or in increased heartburn with anti-inflammatory drugs.  Please keep in mind that muscle relaxer's can cause fatigue and should not be taken while at work or driving.  Back pain is very common.  The pain often gets better over time.  The cause of back pain is usually not dangerous.  Most people can learn to manage their back pain on their own.  Home Care Stay active.  Start with short walks on flat ground if you can.  Try to walk farther each day. Do not sit, drive or stand in one place for more than 30 minutes.  Do not stay in bed. Do not avoid exercise or work.  Activity can help your back heal faster. Be careful when you bend or lift an object.  Bend at your knees, keep the object close to you, and do not twist. Sleep on a firm mattress.  Lie on your side, and bend your knees.  If you lie on your back, put a pillow under your knees. Only take medicines as told by your doctor. Put ice on the injured area. Put ice in a plastic bag Place a towel between your skin and the bag Leave the ice on for 15-20 minutes, 3-4 times a day for the first 2-3 days. 210 After that, you can switch between ice and heat packs. Ask your doctor about back exercises or massage. Avoid feeling anxious or stressed.  Find good ways to deal with stress, such as exercise.  Get Help Right Way If: Your pain does not go away with rest or medicine. Your pain does not go away in 1 week. You have new problems. You do not feel well. The pain  spreads into your legs. You cannot control when you poop (bowel movement) or pee (urinate) You feel sick to your stomach (nauseous) or throw up (vomit) You have belly (abdominal) pain. You feel like you may pass out (faint). If you develop a fever.  Make Sure you: Understand these instructions. Will watch your condition Will get help right away if you are not doing well or get worse.  Your e-visit answers were reviewed by a board certified advanced clinical practitioner to complete your personal care plan.  Depending on the condition, your plan could have included both over the counter or prescription medications.  If there is a problem please reply  once you have received a response from your provider.  Your safety is important to [place].  If you have drug allergies check your prescription carefully.    You can use MyChart to ask questions about today's visit, request a non-urgent call back, or ask for a work or school excuse for 24 hours related to this e-Visit. If it has been greater than 24 hours you will need to follow up with your provider, or enter a new e-Visit to address those concerns.  You will get an e-mail in the next two days asking about your experience.  I hope that your  e-visit has been valuable and will speed your recovery. Thank you for using e-visits.  I have spent 5 minutes in review of e-visit questionnaire, review and updating patient chart, medical decision making and response to patient.   Margaretann Loveless, PA-C

## 2023-08-18 ENCOUNTER — Other Ambulatory Visit (HOSPITAL_BASED_OUTPATIENT_CLINIC_OR_DEPARTMENT_OTHER): Payer: Self-pay

## 2023-08-19 ENCOUNTER — Other Ambulatory Visit: Payer: Self-pay

## 2023-08-19 ENCOUNTER — Other Ambulatory Visit (HOSPITAL_BASED_OUTPATIENT_CLINIC_OR_DEPARTMENT_OTHER): Payer: Self-pay

## 2023-08-24 ENCOUNTER — Other Ambulatory Visit (HOSPITAL_BASED_OUTPATIENT_CLINIC_OR_DEPARTMENT_OTHER): Payer: Self-pay

## 2023-08-24 ENCOUNTER — Other Ambulatory Visit: Payer: Self-pay

## 2023-09-03 ENCOUNTER — Other Ambulatory Visit (HOSPITAL_BASED_OUTPATIENT_CLINIC_OR_DEPARTMENT_OTHER): Payer: Self-pay

## 2023-09-03 MED ORDER — PROMETHAZINE-DM 6.25-15 MG/5ML PO SYRP
ORAL_SOLUTION | ORAL | 0 refills | Status: DC
Start: 2023-09-03 — End: 2023-09-04
  Filled 2023-09-03: qty 118, 6d supply, fill #0

## 2023-09-03 MED ORDER — BENZONATATE 100 MG PO CAPS
ORAL_CAPSULE | ORAL | 0 refills | Status: DC
  Filled 2023-09-03: qty 20, 7d supply, fill #0

## 2023-09-04 ENCOUNTER — Ambulatory Visit: Admitting: Family Medicine

## 2023-09-04 ENCOUNTER — Other Ambulatory Visit (HOSPITAL_BASED_OUTPATIENT_CLINIC_OR_DEPARTMENT_OTHER): Payer: Self-pay

## 2023-09-04 VITALS — BP 122/85 | HR 101 | Temp 97.7°F | Ht 64.0 in | Wt 237.4 lb

## 2023-09-04 DIAGNOSIS — J069 Acute upper respiratory infection, unspecified: Secondary | ICD-10-CM

## 2023-09-04 MED ORDER — HYDROCODONE BIT-HOMATROP MBR 5-1.5 MG/5ML PO SOLN
5.0000 mL | Freq: Three times a day (TID) | ORAL | 0 refills | Status: DC | PRN
  Filled 2023-09-04: qty 120, 8d supply, fill #0

## 2023-09-04 NOTE — Patient Instructions (Signed)
 Please follow up as scheduled for your next visit with me: 12/01/2023   If you have any questions or concerns, please don't hesitate to send me a message via MyChart or call the office at [phone]. Thank you for visiting with [place] today! It's our pleasure caring for you.   VISIT SUMMARY:  Today, you were seen for symptoms including fever, body aches, cough, and sore throat that have persisted for four days. Your COVID-19 and influenza tests were negative, and you have experienced significant weight loss due to lifestyle changes.  YOUR PLAN:  -VIRAL UPPER RESPIRATORY INFECTION: You have a viral upper respiratory infection, which is an infection of the upper part of your respiratory system caused by a virus. Since your COVID-19 and influenza tests were negative, it is likely another virus. You were prescribed hydrocodone cough syrup for nighttime use due to your codeine allergy. For daytime cough management, you can use Delsym or Mucinex DM. It is important to rest, eat nutritious food, and stay hydrated. If needed, you can also consider using Tessalon pearls for additional cough relief. Avoid using prednisone unless your symptoms do not improve, as it may affect your weight loss progress.  INSTRUCTIONS:  Please follow the prescribed medication regimen and the recommendations provided. If your symptoms do not improve or worsen, please schedule a follow-up appointment.

## 2023-09-04 NOTE — Progress Notes (Signed)
 Subjective  CC:  Chief Complaint  Patient presents with   Cough    Cough will not go away and perls are not helping     HPI: [name] [name] is a 38 y.o. female who presents to the office today to address the problems listed above in the chief complaint. Discussed the use of AI scribe software for clinical note transcription with the patient, who gave verbal consent to proceed.  History of Present Illness   [name] D Iseman "[name]" is a 38 year old female who presents with fever, body aches, cough, and sore throat. She is accompanied by her daughter.  She began experiencing symptoms four days ago, initially with a fever of 100.73F, managed with Tylenol. The fever has resolved, but she continues to have body aches, a persistent cough, and a sore throat. She describes her breathing as 'noisy' due to mouth breathing but has no significant breathing difficulties. No gastrointestinal symptoms such as nausea, vomiting, or diarrhea are present, and she attributes stomach gurgling to hunger.  She visited urgent care where both she and her daughter were tested for COVID-19 and influenza, with both tests returning negative results. Despite the negative results, her symptoms have persisted, prompting further evaluation.  She has experienced significant weight loss, dropping from 254 pounds in December to 237 pounds currently, which she attributes to lifestyle changes. She notes that her clothing fits more loosely, indicating the extent of her weight loss.       Assessment  1. Viral upper respiratory tract infection      Plan  Assessment and Plan    Viral Upper Respiratory Infection Symptoms consistent with viral URI. COVID-19 and influenza tests negative. Suspected viral etiology. Antiviral treatment not recommended due to illness duration. - Prescribe hydrocodone cough syrup for nighttime use due to codeine allergy. - Recommend Delsym or Mucinex DM during the day for cough management. -  Advise rest, nutrition, and hydration. - Consider Tessalon pearls for additional cough relief if needed. - Avoid prednisone unless symptoms do not improve, to prevent setbacks in weight loss progress.        No orders of the defined types were placed in this encounter.  Meds ordered this encounter  Medications   HYDROcodone bit-homatropine (HYCODAN) 5-1.5 MG/5ML syrup    Sig: Take 5 mLs by mouth every 8 (eight) hours as needed for cough.    Dispense:  120 mL    Refill:  0     I reviewed the patients updated PMH, FH, and SocHx.    Patient Active Problem List   Diagnosis Date Noted   Bipolar 2 disorder (HCC) 09/19/2019    Priority: High   Fibromyalgia 09/09/2019    Priority: High   Panic disorder 06/01/2019    Priority: High   Family history of colon cancer 05/16/2019    Priority: High   Major depression, recurrent, chronic (HCC) 05/16/2019    Priority: High   Insomnia due to other mental disorder 03/13/2019    Priority: High   Morbid obesity (HCC) 04/23/2017    Priority: High   Acquired iron deficiency anemia due to decreased absorption 02/22/2015    Priority: High   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    Priority: High   Vitamin B12 deficiency 06/01/2019    Priority: Medium    Pain in both lower extremities 11/21/2018    Priority: Medium    Nocturnal leg cramps 10/26/2018    Priority: Medium    Nephrolithiasis  Priority: Medium    Vitamin D deficiency 06/01/2019    Priority: Low   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   benzonatate (TESSALON) 100 MG capsule Take 1 capsule (100 mg total) by  mouth 3 (three) times a day as needed for cough.   busPIRone (BUSPAR) 15 MG tablet Take 1 tablet (15 mg total) by mouth 3 (three) times daily with meals.   busPIRone (BUSPAR) 15 MG tablet Take 1 tablet (15 mg total) by mouth 3 (three) times daily with food.   cariprazine (VRAYLAR) 3 MG capsule Take 1 capsule (3 mg total) by mouth at bedtime.   cariprazine (VRAYLAR) 3 MG capsule Take 1 capsule (3 mg total) by mouth at bedtime.   Continuous Glucose Sensor (DEXCOM G7 SENSOR) MISC Apply one sensor every 10 days   cyanocobalamin (VITAMIN B12) 1000 MCG/ML injection Inject 1 mL (1,000 mcg total) into the muscle every 30 (thirty) days.   cyclobenzaprine (FLEXERIL) 10 MG tablet Take 0.5-1 tablets (5-10 mg total) by mouth 3 (three) times daily as needed.   Eszopiclone 3 MG TABS Take 1 tablet (3 mg total) by mouth at bedtime as needed for insomnia. Do not eat within 30 minutes of taking.   Glucagon (BAQSIMI TWO PACK) 3 MG/DOSE POWD Use as directed as needed.   glucose blood test strip Use as instructed   HYDROcodone bit-homatropine (HYCODAN) 5-1.5 MG/5ML syrup Take 5 mLs by mouth every 8 (eight) hours as needed for cough.   hydrOXYzine (VISTARIL) 50 MG capsule Take 1 capsule (50 mg total) by mouth every 6 (six) hours as needed.   hydrOXYzine (VISTARIL) 50 MG capsule Take 1 capsule (50 mg total) by mouth every 6 (six) hours as needed.   lamoTRIgine (LAMICTAL) 150 MG tablet Take 2 tablets (300 mg total) by mouth daily. If out of lamictal more than 1 week do not restart, call [phone]   lithium carbonate (LITHOBID) 300 MG ER tablet Take 2 tablets (600 mg total) by mouth at bedtime.   lithium carbonate (LITHOBID) 300 MG ER tablet Take 2 tablets (600 mg total) by mouth at bedtime.   metFORMIN (GLUCOPHAGE-XR) 500 MG 24 hr tablet Take 1 tablet (500 mg total) by mouth daily with supper.   ondansetron (ZOFRAN-ODT) 4 MG disintegrating tablet Take 1 tablet by mouth every 8 hours as needed for nausea   phentermine  (ADIPEX-P) 37.5 MG tablet Take 0.5-1 tablets (18.75-37.5 mg total) by mouth daily before breakfast.   valACYclovir (VALTREX) 1000 MG tablet Take 2 tablets (2,000 mg total) by mouth 2 (two) times daily. Once, as needed for cold sores   [DISCONTINUED] promethazine-dextromethorphan (PROMETHAZINE-DM) 6.25-15 MG/5ML syrup Take 5 mL by mouth every 6 (six) hours as needed (cough).    Allergies: Patient is allergic to nsaids, other, codeine, and keflex [cephalexin]. Family History: Patient family history includes ADD / ADHD in her daughter and daughter; Anxiety disorder in her father and mother; Arthritis in her father, maternal grandfather, maternal grandmother, and mother; Bipolar disorder in her father; Cancer in her father; Colon cancer (age of onset: 32) in her father; Depression in her father, maternal grandmother, mother, and paternal  grandmother; Diabetes in her maternal grandfather; Early death in her father and maternal grandfather; Hearing loss in her father; Hyperlipidemia in her father; Hypertension in her father, maternal grandfather, maternal grandmother, and mother; Kidney disease in her father; Obesity in her father, maternal aunt, maternal grandfather, maternal grandmother, maternal uncle, and mother; Sleep apnea in her father; Stroke in her maternal grandfather; Uterine cancer (age of onset: 34) in her maternal grandmother; Varicose Veins in her maternal grandmother; Vision loss in her maternal grandfather and maternal grandmother. Social History:  Patient  reports that she has never smoked. She has never been exposed to tobacco smoke. She has never used smokeless tobacco. She reports that she does not currently use alcohol. She reports that she does not use drugs.  Review of Systems: Constitutional: Negative for fever malaise or anorexia Cardiovascular: negative for chest pain Respiratory: negative for SOB or persistent cough Gastrointestinal: negative for abdominal pain  Objective   Vitals: BP 122/85   Pulse (!) 101   Temp 97.7 F (36.5 C)   Ht 5\' 4"  (1.626 m)   Wt 237 lb 6.4 oz (107.7 kg)   SpO2 100%   BMI 40.75 kg/m  General: no acute distress , A&Ox3, coughing HEENT: PEERL, conjunctiva normal, neck is supple Cardiovascular:  RRR without murmur or gallop.  Respiratory:  Good breath sounds bilaterally, CTAB with normal respiratory effort Skin:  Warm, no rashes  Commons side effects, risks, benefits, and alternatives for medications and treatment plan prescribed today were discussed, and the patient expressed understanding of the given instructions. Patient is instructed to call or message via MyChart if he/she has any questions or concerns regarding our treatment plan. No barriers to understanding were identified. We discussed Red Flag symptoms and signs in detail. Patient expressed understanding regarding what to do in case of urgent or emergency type symptoms.  Medication list was reconciled, printed and provided to the patient in AVS. Patient instructions and summary information was reviewed with the patient as documented in the AVS. This note was prepared with assistance of Dragon voice recognition software. Occasional wrong-word or sound-a-like substitutions may have occurred due to the inherent limitations of voice recognition software

## 2023-09-07 ENCOUNTER — Other Ambulatory Visit (HOSPITAL_BASED_OUTPATIENT_CLINIC_OR_DEPARTMENT_OTHER): Payer: Self-pay

## 2023-09-07 ENCOUNTER — Other Ambulatory Visit: Payer: Self-pay

## 2023-09-15 ENCOUNTER — Other Ambulatory Visit (HOSPITAL_BASED_OUTPATIENT_CLINIC_OR_DEPARTMENT_OTHER): Payer: Self-pay

## 2023-09-15 ENCOUNTER — Other Ambulatory Visit: Payer: Self-pay

## 2023-09-15 MED ORDER — METFORMIN HCL ER 500 MG PO TB24
500.0000 mg | ORAL_TABLET | Freq: Every day | ORAL | 3 refills | Status: DC
Start: 2023-09-15 — End: 2024-02-06
  Filled 2023-09-15: qty 30, 30d supply, fill #0
  Filled 2023-10-18: qty 30, 30d supply, fill #1
  Filled 2023-11-22: qty 30, 30d supply, fill #2
  Filled 2023-12-26: qty 30, 30d supply, fill #3

## 2023-09-16 ENCOUNTER — Other Ambulatory Visit: Payer: Self-pay

## 2023-09-24 ENCOUNTER — Other Ambulatory Visit (HOSPITAL_BASED_OUTPATIENT_CLINIC_OR_DEPARTMENT_OTHER): Payer: Self-pay

## 2023-09-24 ENCOUNTER — Other Ambulatory Visit: Payer: Self-pay

## 2023-09-26 ENCOUNTER — Other Ambulatory Visit (HOSPITAL_BASED_OUTPATIENT_CLINIC_OR_DEPARTMENT_OTHER): Payer: Self-pay

## 2023-09-26 MED ORDER — ONDANSETRON 4 MG PO TBDP
4.0000 mg | ORAL_TABLET | Freq: Three times a day (TID) | ORAL | 0 refills | Status: DC | PRN
  Filled 2023-09-26: qty 10, 30d supply, fill #0

## 2023-10-08 ENCOUNTER — Other Ambulatory Visit (HOSPITAL_BASED_OUTPATIENT_CLINIC_OR_DEPARTMENT_OTHER): Payer: Self-pay

## 2023-10-08 MED ORDER — ESZOPICLONE 3 MG PO TABS
3.0000 mg | ORAL_TABLET | Freq: Every evening | ORAL | 2 refills | Status: DC | PRN
  Filled 2023-11-12: qty 30, 30d supply, fill #0
  Filled 2023-12-09 – 2023-12-10 (×2): qty 30, 30d supply, fill #1
  Filled 2024-01-05 – 2024-01-06 (×2): qty 30, 30d supply, fill #2
  Filled 2024-01-07: qty 15, 15d supply, fill #2
  Filled 2024-02-04 – 2024-02-05 (×2): qty 30, 30d supply, fill #2

## 2023-10-13 ENCOUNTER — Encounter (HOSPITAL_BASED_OUTPATIENT_CLINIC_OR_DEPARTMENT_OTHER): Payer: Self-pay

## 2023-10-14 ENCOUNTER — Other Ambulatory Visit (HOSPITAL_BASED_OUTPATIENT_CLINIC_OR_DEPARTMENT_OTHER): Payer: Self-pay

## 2023-10-14 ENCOUNTER — Other Ambulatory Visit: Payer: Self-pay

## 2023-10-18 ENCOUNTER — Other Ambulatory Visit: Payer: Self-pay

## 2023-10-18 ENCOUNTER — Other Ambulatory Visit: Payer: Self-pay | Admitting: Family Medicine

## 2023-10-18 DIAGNOSIS — R739 Hyperglycemia, unspecified: Secondary | ICD-10-CM

## 2023-10-18 DIAGNOSIS — E161 Other hypoglycemia: Secondary | ICD-10-CM

## 2023-10-19 ENCOUNTER — Other Ambulatory Visit (HOSPITAL_BASED_OUTPATIENT_CLINIC_OR_DEPARTMENT_OTHER): Payer: Self-pay

## 2023-10-19 MED ORDER — ONETOUCH VERIO VI STRP
ORAL_STRIP | 12 refills | Status: DC
  Filled 2023-10-19: qty 100, 30d supply, fill #0
  Filled 2023-11-22: qty 100, 30d supply, fill #1

## 2023-10-20 ENCOUNTER — Other Ambulatory Visit (HOSPITAL_BASED_OUTPATIENT_CLINIC_OR_DEPARTMENT_OTHER): Payer: Self-pay

## 2023-10-20 ENCOUNTER — Encounter (HOSPITAL_BASED_OUTPATIENT_CLINIC_OR_DEPARTMENT_OTHER): Payer: Self-pay

## 2023-10-20 ENCOUNTER — Other Ambulatory Visit (HOSPITAL_COMMUNITY): Payer: Self-pay

## 2023-10-20 ENCOUNTER — Telehealth: Payer: Self-pay

## 2023-10-20 NOTE — Telephone Encounter (Signed)
 Pharmacy Patient Advocate Encounter   Received notification from CoverMyMeds that prior authorization for Dexcom G7 Sensor is required/requested.   Insurance verification completed.   The patient is insured through [facility] .   Per test claim: PA required; PA submitted to above mentioned insurance via CoverMyMeds Key/confirmation #/EOC [id number] Status is pending

## 2023-10-21 ENCOUNTER — Other Ambulatory Visit (HOSPITAL_BASED_OUTPATIENT_CLINIC_OR_DEPARTMENT_OTHER): Payer: Self-pay

## 2023-10-21 NOTE — Telephone Encounter (Signed)
 Pharmacy Patient Advocate Encounter  Received notification from [facility] that Prior Authorization for [facility] G7 SENSOR has been DENIED.  Full denial letter will be uploaded to the media tab. See denial reason below.   PA #/Case ID/Reference #: [id number]

## 2023-10-22 ENCOUNTER — Other Ambulatory Visit (HOSPITAL_BASED_OUTPATIENT_CLINIC_OR_DEPARTMENT_OTHER): Payer: Self-pay

## 2023-10-23 ENCOUNTER — Other Ambulatory Visit (HOSPITAL_BASED_OUTPATIENT_CLINIC_OR_DEPARTMENT_OTHER): Payer: Self-pay

## 2023-10-25 ENCOUNTER — Other Ambulatory Visit: Payer: Self-pay | Admitting: Family Medicine

## 2023-10-26 NOTE — Telephone Encounter (Signed)
 09/04/2023 LOV  07/27/2023  30/2 refills

## 2023-10-28 ENCOUNTER — Other Ambulatory Visit (HOSPITAL_BASED_OUTPATIENT_CLINIC_OR_DEPARTMENT_OTHER): Payer: Self-pay

## 2023-10-28 ENCOUNTER — Other Ambulatory Visit (HOSPITAL_COMMUNITY): Payer: Self-pay

## 2023-10-28 MED ORDER — PHENTERMINE HCL 37.5 MG PO TABS
18.2500 mg | ORAL_TABLET | Freq: Every day | ORAL | 2 refills | Status: DC
Start: 2023-10-28 — End: 2024-02-28
  Filled 2023-10-28: qty 30, 30d supply, fill #0
  Filled 2023-11-29: qty 30, 30d supply, fill #1
  Filled 2024-01-05: qty 30, 30d supply, fill #2

## 2023-10-29 ENCOUNTER — Other Ambulatory Visit (HOSPITAL_COMMUNITY): Payer: Self-pay

## 2023-11-04 ENCOUNTER — Other Ambulatory Visit (HOSPITAL_COMMUNITY): Payer: Self-pay

## 2023-11-04 ENCOUNTER — Other Ambulatory Visit (HOSPITAL_BASED_OUTPATIENT_CLINIC_OR_DEPARTMENT_OTHER): Payer: Self-pay

## 2023-11-04 MED ORDER — DEXCOM G7 SENSOR MISC
11 refills | Status: DC
Start: 2023-11-04 — End: 2024-04-26
  Filled 2023-11-04: qty 3, 30d supply, fill #0

## 2023-11-06 ENCOUNTER — Other Ambulatory Visit (HOSPITAL_BASED_OUTPATIENT_CLINIC_OR_DEPARTMENT_OTHER): Payer: Self-pay

## 2023-11-06 ENCOUNTER — Encounter (HOSPITAL_BASED_OUTPATIENT_CLINIC_OR_DEPARTMENT_OTHER): Payer: Self-pay

## 2023-11-06 ENCOUNTER — Other Ambulatory Visit (HOSPITAL_COMMUNITY): Payer: Self-pay

## 2023-11-12 ENCOUNTER — Other Ambulatory Visit (HOSPITAL_BASED_OUTPATIENT_CLINIC_OR_DEPARTMENT_OTHER): Payer: Self-pay

## 2023-11-18 ENCOUNTER — Other Ambulatory Visit (HOSPITAL_COMMUNITY): Payer: Self-pay

## 2023-11-19 ENCOUNTER — Other Ambulatory Visit (HOSPITAL_COMMUNITY): Payer: Self-pay

## 2023-11-23 ENCOUNTER — Other Ambulatory Visit (HOSPITAL_BASED_OUTPATIENT_CLINIC_OR_DEPARTMENT_OTHER): Payer: Self-pay

## 2023-11-30 ENCOUNTER — Other Ambulatory Visit (HOSPITAL_BASED_OUTPATIENT_CLINIC_OR_DEPARTMENT_OTHER): Payer: Self-pay

## 2023-11-30 ENCOUNTER — Encounter (HOSPITAL_BASED_OUTPATIENT_CLINIC_OR_DEPARTMENT_OTHER): Payer: Self-pay

## 2023-11-30 ENCOUNTER — Other Ambulatory Visit: Payer: Self-pay

## 2023-11-30 MED ORDER — ACCU-CHEK GUIDE W/DEVICE KIT
PACK | 0 refills | Status: DC
  Filled 2023-11-30 – 2023-12-15 (×4): qty 1, 90d supply, fill #0

## 2023-11-30 MED ORDER — ACCU-CHEK GUIDE TEST VI STRP
ORAL_STRIP | 11 refills | Status: AC
  Filled 2023-11-30 – 2023-12-15 (×3): qty 100, 33d supply, fill #0
  Filled 2024-02-28: qty 100, 33d supply, fill #1
  Filled 2024-04-18: qty 100, 33d supply, fill #2
  Filled 2024-05-23: qty 100, 33d supply, fill #3

## 2023-12-01 ENCOUNTER — Ambulatory Visit: Payer: BC Managed Care – PPO | Admitting: Family Medicine

## 2023-12-09 ENCOUNTER — Other Ambulatory Visit: Payer: Self-pay

## 2023-12-09 ENCOUNTER — Other Ambulatory Visit (HOSPITAL_BASED_OUTPATIENT_CLINIC_OR_DEPARTMENT_OTHER): Payer: Self-pay

## 2023-12-10 ENCOUNTER — Other Ambulatory Visit: Payer: Self-pay

## 2023-12-15 ENCOUNTER — Other Ambulatory Visit (HOSPITAL_BASED_OUTPATIENT_CLINIC_OR_DEPARTMENT_OTHER): Payer: Self-pay

## 2023-12-15 ENCOUNTER — Other Ambulatory Visit: Payer: Self-pay

## 2023-12-15 MED ORDER — ACCU-CHEK SOFTCLIX LANCETS MISC
5 refills | Status: AC
  Filled 2023-12-15: qty 300, 90d supply, fill #0

## 2023-12-21 ENCOUNTER — Other Ambulatory Visit (HOSPITAL_BASED_OUTPATIENT_CLINIC_OR_DEPARTMENT_OTHER): Payer: Self-pay

## 2023-12-21 MED ORDER — ONDANSETRON 4 MG PO TBDP
4.0000 mg | ORAL_TABLET | Freq: Three times a day (TID) | ORAL | 0 refills | Status: DC | PRN
  Filled 2023-12-21: qty 10, 28d supply, fill #0
  Filled 2023-12-31 – 2024-01-05 (×2): qty 10, 4d supply, fill #0

## 2023-12-25 ENCOUNTER — Other Ambulatory Visit (HOSPITAL_BASED_OUTPATIENT_CLINIC_OR_DEPARTMENT_OTHER): Payer: Self-pay

## 2023-12-31 ENCOUNTER — Other Ambulatory Visit (HOSPITAL_BASED_OUTPATIENT_CLINIC_OR_DEPARTMENT_OTHER): Payer: Self-pay

## 2023-12-31 ENCOUNTER — Other Ambulatory Visit: Payer: Self-pay

## 2023-12-31 MED ORDER — ESZOPICLONE 3 MG PO TABS
3.0000 mg | ORAL_TABLET | Freq: Every evening | ORAL | 2 refills | Status: DC | PRN
  Filled 2024-01-09: qty 30, 30d supply, fill #0
  Filled 2024-03-03: qty 30, 30d supply, fill #1
  Filled 2024-03-29 – 2024-03-31 (×2): qty 30, 30d supply, fill #2

## 2023-12-31 MED ORDER — LITHIUM CARBONATE ER 300 MG PO TBCR
600.0000 mg | EXTENDED_RELEASE_TABLET | Freq: Every day | ORAL | 1 refills | Status: AC
  Filled 2023-12-31 (×2): qty 180, 90d supply, fill #0
  Filled 2024-07-10: qty 180, 90d supply, fill #1

## 2023-12-31 MED ORDER — BUSPIRONE HCL 15 MG PO TABS
15.0000 mg | ORAL_TABLET | Freq: Three times a day (TID) | ORAL | 1 refills | Status: AC
  Filled 2024-05-05: qty 270, 90d supply, fill #0

## 2023-12-31 MED ORDER — HYDROXYZINE PAMOATE 50 MG PO CAPS
50.0000 mg | ORAL_CAPSULE | Freq: Four times a day (QID) | ORAL | 1 refills | Status: AC | PRN
  Filled 2023-12-31: qty 270, 68d supply, fill #0

## 2023-12-31 MED ORDER — VRAYLAR 3 MG PO CAPS
3.0000 mg | ORAL_CAPSULE | Freq: Every day | ORAL | 4 refills | Status: AC
  Filled 2023-12-31: qty 30, 30d supply, fill #0

## 2023-12-31 MED ORDER — LAMOTRIGINE 150 MG PO TABS
300.0000 mg | ORAL_TABLET | Freq: Every day | ORAL | 1 refills | Status: AC
  Filled 2024-05-08: qty 180, 90d supply, fill #0

## 2024-01-01 ENCOUNTER — Other Ambulatory Visit (HOSPITAL_BASED_OUTPATIENT_CLINIC_OR_DEPARTMENT_OTHER): Payer: Self-pay

## 2024-01-05 ENCOUNTER — Other Ambulatory Visit (HOSPITAL_BASED_OUTPATIENT_CLINIC_OR_DEPARTMENT_OTHER): Payer: Self-pay

## 2024-01-06 ENCOUNTER — Other Ambulatory Visit (HOSPITAL_BASED_OUTPATIENT_CLINIC_OR_DEPARTMENT_OTHER): Payer: Self-pay

## 2024-01-06 ENCOUNTER — Other Ambulatory Visit: Payer: Self-pay

## 2024-01-07 ENCOUNTER — Ambulatory Visit: Payer: Self-pay | Admitting: Podiatry

## 2024-01-07 ENCOUNTER — Other Ambulatory Visit (HOSPITAL_BASED_OUTPATIENT_CLINIC_OR_DEPARTMENT_OTHER): Payer: Self-pay

## 2024-01-08 ENCOUNTER — Ambulatory Visit

## 2024-01-08 ENCOUNTER — Ambulatory Visit: Admitting: Podiatry

## 2024-01-08 VITALS — Ht 64.0 in | Wt 237.0 lb

## 2024-01-08 DIAGNOSIS — M722 Plantar fascial fibromatosis: Secondary | ICD-10-CM

## 2024-01-08 MED ORDER — TRIAMCINOLONE ACETONIDE 10 MG/ML IJ SUSP
5.0000 mg | Freq: Once | INTRAMUSCULAR | Status: AC
  Administered 2024-01-08: 5 mg via INTRAMUSCULAR

## 2024-01-08 NOTE — Progress Notes (Signed)
 Subjective: Chief Complaint  Patient presents with   Foot Pain    RM 13 Patient is here for left heel pain (plantar fasciitis) possible steroid injection.  Patient states pain is primarily located in the heel of the left foot and right pain may come overcompensation when walking.    38 year old female presents with above concerns.  She is getting pain in both of her heels left side worse than right.  She in the right side starting because she is compensating to the left side.  She is interested in a steroid injection to the left side today.  No recent injuries that she reports or any other changes.   Objective: AAO x3, NAD DP/PT pulses palpable bilaterally, CRT less than 3 seconds Today there is recurrent tenderness to palpation along the plantar medial tubercle of the calcaneus at the insertion of plantar fascia on the left > right foot. There is no pain along the course of the plantar fascia within the arch of the foot. Plantar fascia appears to be intact. There is no pain with lateral compression of the calcaneus or pain with vibratory sensation. There is no pain along the course or insertion of the achilles tendon. No other areas of tenderness to bilateral lower extremities.  No area pinpoint tenderness.  Negative Tinel sign. No pain with calf compression, swelling, warmth, erythema  Assessment: Bilateral heel pain, Plantar fasciitis  Plan: -All treatment options discussed with the patient including all alternatives, risks, complications.  -X-rays were obtained and reviewed bilaterally.  3 views were obtained.  No evidence of acute fracture.  Hardware intact from prior surgeries noted bilaterally.  Flatfoot is present there is decreased calcaneal inclination. -Steroid injection performed on the left.  See procedure note below. -Stretching, icing daily -Discussed shoes and good arch support. -Patient encouraged to call the office with any questions, concerns, change in symptoms.    Procedure: Injection Tendon/Ligament Discussed alternatives, risks, complications and verbal consent was obtained.  Location: Left plantar fascia at the glabrous junction; medial approach. Skin Prep: Alcohol. Injectate: 0.5cc 0.5% marcaine  plain, 0.5 cc 2% lidocaine  plain and, 1 cc kenalog  10. Disposition: Patient tolerated procedure well. Injection site dressed with a band-aid.  Post-injection care was discussed and return precautions discussed.   Return if symptoms worsen or fail to improve.  [name] [name] DPM

## 2024-01-08 NOTE — Patient Instructions (Signed)

## 2024-01-09 ENCOUNTER — Other Ambulatory Visit (HOSPITAL_BASED_OUTPATIENT_CLINIC_OR_DEPARTMENT_OTHER): Payer: Self-pay

## 2024-01-29 ENCOUNTER — Ambulatory Visit: Admitting: Podiatry

## 2024-01-29 ENCOUNTER — Encounter (INDEPENDENT_AMBULATORY_CARE_PROVIDER_SITE_OTHER): Payer: Self-pay

## 2024-01-29 ENCOUNTER — Encounter: Payer: Self-pay | Admitting: Podiatry

## 2024-02-03 ENCOUNTER — Other Ambulatory Visit (HOSPITAL_BASED_OUTPATIENT_CLINIC_OR_DEPARTMENT_OTHER): Payer: Self-pay

## 2024-02-04 ENCOUNTER — Other Ambulatory Visit: Payer: Self-pay

## 2024-02-04 ENCOUNTER — Other Ambulatory Visit (HOSPITAL_BASED_OUTPATIENT_CLINIC_OR_DEPARTMENT_OTHER): Payer: Self-pay

## 2024-02-05 ENCOUNTER — Other Ambulatory Visit (HOSPITAL_BASED_OUTPATIENT_CLINIC_OR_DEPARTMENT_OTHER): Payer: Self-pay

## 2024-02-06 ENCOUNTER — Encounter (HOSPITAL_BASED_OUTPATIENT_CLINIC_OR_DEPARTMENT_OTHER): Payer: Self-pay

## 2024-02-06 ENCOUNTER — Other Ambulatory Visit (HOSPITAL_BASED_OUTPATIENT_CLINIC_OR_DEPARTMENT_OTHER): Payer: Self-pay

## 2024-02-06 MED ORDER — METFORMIN HCL ER 500 MG PO TB24
500.0000 mg | ORAL_TABLET | Freq: Every day | ORAL | 3 refills | Status: AC
  Filled 2024-02-06: qty 30, 30d supply, fill #0
  Filled 2024-03-03: qty 30, 30d supply, fill #1
  Filled 2024-04-08: qty 30, 30d supply, fill #2
  Filled 2024-05-08: qty 30, 30d supply, fill #3

## 2024-02-13 ENCOUNTER — Other Ambulatory Visit: Payer: Self-pay | Admitting: Medical Genetics

## 2024-02-16 ENCOUNTER — Ambulatory Visit: Admitting: Podiatry

## 2024-02-16 ENCOUNTER — Encounter: Payer: Self-pay | Admitting: Podiatry

## 2024-02-16 DIAGNOSIS — M722 Plantar fascial fibromatosis: Secondary | ICD-10-CM | POA: Diagnosis not present

## 2024-02-16 MED ORDER — TRIAMCINOLONE ACETONIDE 10 MG/ML IJ SUSP: 5.0000 mg | Freq: Once | INTRAMUSCULAR | Status: DC

## 2024-02-17 NOTE — Progress Notes (Signed)
 Subjective: Chief Complaint  Patient presents with   Foot Pain    L heel 15% improvement from injection. R heel medial is becoming more sore as well. Would like injection.  Non diabetic no anti coag     38 year old female presents with above concerns.  States that she is having pain in the left heel that she started get pain on the right side as well.  She presents today for another injection.  She does not report any recent injuries or changes otherwise.    Objective: AAO x3, NAD DP/PT pulses palpable bilaterally, CRT less than 3 seconds Today there is recurrent tenderness to palpation along the plantar medial tubercle of the calcaneus at the insertion of plantar fascia on the left > right foot. There is no pain along the course of the plantar fascia within the arch of the foot. Plantar fascia appears to be intact. There is no pain with lateral compression of the calcaneus or pain with vibratory sensation. There is no pain along the course or insertion of the achilles tendon. No other areas of tenderness to bilateral lower extremities.  No area pinpoint tenderness.  Negative Tinel sign.  Is about the same as last appointment but somewhat improved. No pain with calf compression, swelling, warmth, erythema  Assessment: Bilateral heel pain, Plantar fasciitis  Plan: -All treatment options discussed with the patient including all alternatives, risks, complications.  -Steroid injection performed on the left and the right today.  See procedure note below. -Stretching, icing daily -Plantar fascia brace dispensed bilaterally. -Discussed shoes and good arch support. -Patient encouraged to call the office with any questions, concerns, change in symptoms.   Procedure: Injection Tendon/Ligament Discussed alternatives, risks, complications and verbal consent was obtained.  Location: Left and right plantar fascia at the glabrous junction; medial approach. Skin Prep: Alcohol. Injectate: 0.5cc 0.5%  marcaine  plain, 0.5 cc 2% lidocaine  plain and, 1 cc kenalog  10. Disposition: Patient tolerated procedure well. Injection site dressed with a band-aid.  Post-injection care was discussed and return precautions discussed.   Return if symptoms worsen or fail to improve.  [name] [name] DPM

## 2024-02-28 ENCOUNTER — Other Ambulatory Visit: Payer: Self-pay | Admitting: Family Medicine

## 2024-02-28 MED ORDER — PHENTERMINE HCL 37.5 MG PO TABS
18.2500 mg | ORAL_TABLET | Freq: Every day | ORAL | 2 refills | Status: DC
  Filled 2024-02-28: qty 30, 30d supply, fill #0
  Filled 2024-04-18: qty 30, 30d supply, fill #1
  Filled 2024-06-02: qty 30, 30d supply, fill #2

## 2024-02-29 ENCOUNTER — Other Ambulatory Visit (HOSPITAL_BASED_OUTPATIENT_CLINIC_OR_DEPARTMENT_OTHER): Payer: Self-pay

## 2024-02-29 ENCOUNTER — Other Ambulatory Visit: Payer: Self-pay

## 2024-03-02 ENCOUNTER — Other Ambulatory Visit: Payer: Self-pay

## 2024-03-02 ENCOUNTER — Emergency Department (HOSPITAL_BASED_OUTPATIENT_CLINIC_OR_DEPARTMENT_OTHER)

## 2024-03-02 ENCOUNTER — Emergency Department (HOSPITAL_BASED_OUTPATIENT_CLINIC_OR_DEPARTMENT_OTHER)
Admission: EM | Admit: 2024-03-02 | Discharge: 2024-03-03 | Disposition: A | Discharge status: Home / Self Care | Attending: Emergency Medicine | Admitting: Emergency Medicine

## 2024-03-02 ENCOUNTER — Encounter (HOSPITAL_BASED_OUTPATIENT_CLINIC_OR_DEPARTMENT_OTHER): Payer: Self-pay | Admitting: Emergency Medicine

## 2024-03-02 DIAGNOSIS — R109 Unspecified abdominal pain: Secondary | ICD-10-CM | POA: Insufficient documentation

## 2024-03-02 DIAGNOSIS — R300 Dysuria: Secondary | ICD-10-CM | POA: Insufficient documentation

## 2024-03-02 LAB — COMPREHENSIVE METABOLIC PANEL WITH GFR
ALT: 11 U/L (ref 0–44)
AST: 18 U/L (ref 15–41)
Albumin: 4.5 g/dL (ref 3.5–5.0)
Alkaline Phosphatase: 150 U/L — ABNORMAL HIGH (ref 38–126)
Anion gap: 9 (ref 5–15)
BUN: 9 mg/dL (ref 6–20)
CO2: 24 mmol/L (ref 22–32)
Calcium: 9.2 mg/dL (ref 8.9–10.3)
Chloride: 106 mmol/L (ref 98–111)
Creatinine, Ser: 0.76 mg/dL (ref 0.44–1.00)
GFR, Estimated: >60 mL/min (ref >60)
Glucose, Bld: 89 mg/dL (ref 70–99)
Potassium: 3.8 mmol/L (ref 3.5–5.1)
Sodium: 139 mmol/L (ref 135–145)
Total Bilirubin: 0.3 mg/dL (ref 0.0–1.2)
Total Protein: 6.9 g/dL (ref 6.5–8.1)

## 2024-03-02 LAB — URINALYSIS, ROUTINE W REFLEX MICROSCOPIC
Bilirubin Urine: NEGATIVE
Glucose, UA: NEGATIVE mg/dL
Hgb urine dipstick: NEGATIVE
Ketones, ur: NEGATIVE mg/dL
Nitrite: NEGATIVE
Protein, ur: NEGATIVE mg/dL
Specific Gravity, Urine: 1.02 (ref 1.005–1.030)
pH: 6 (ref 5.0–8.0)

## 2024-03-02 LAB — URINALYSIS, MICROSCOPIC (REFLEX): RBC / HPF: NONE SEEN RBC/hpf (ref 0–5)

## 2024-03-02 LAB — CBC
HCT: 41.7 % (ref 36.0–46.0)
Hemoglobin: 13.8 g/dL (ref 12.0–15.0)
MCH: 29.1 pg (ref 26.0–34.0)
MCHC: 33.1 g/dL (ref 30.0–36.0)
MCV: 88 fL (ref 80.0–100.0)
Platelets: 392 K/uL (ref 150–400)
RBC: 4.74 MIL/uL (ref 3.87–5.11)
RDW: 13.2 % (ref 11.5–15.5)
WBC: 10.7 K/uL — ABNORMAL HIGH (ref 4.0–10.5)
nRBC: 0 % (ref 0.0–0.2)

## 2024-03-02 LAB — PREGNANCY, URINE: Preg Test, Ur: NEGATIVE

## 2024-03-02 NOTE — ED Triage Notes (Addendum)
 Pt reports LT flank pain and LLQ ABD pain, hx of kidney stones; reports dysuria, hematuria since Mon night

## 2024-03-03 MED ORDER — HYDROCODONE-ACETAMINOPHEN 5-325 MG PO TABS: 1.0000 | ORAL_TABLET | Freq: Four times a day (QID) | ORAL | 0 refills | Status: DC | PRN

## 2024-03-03 MED ORDER — HYDROCODONE-ACETAMINOPHEN 5-325 MG PO TABS
2.0000 | ORAL_TABLET | Freq: Once | ORAL | Status: AC
  Administered 2024-03-03: 2 via ORAL
  Filled 2024-03-03: qty 2

## 2024-03-03 NOTE — ED Provider Notes (Signed)
 [facility] Provider Note   CSN: [id number] Arrival date & time: 03/02/24  2130     Patient presents with: Flank Pain   [name] [name] is a 38 y.o. female.   Patient is a 38 year old female with history of kidney stones and gastric bypass.  Patient presenting today with complaints of left flank pain.  This has been worsening over the past several days.  She denies any injury or trauma.  She describes some discomfort with urination, but no blood.  She has history of kidney stones in the past and this feels similar.  She tried taking a muscle relaxer at home with no relief.  She also took 800 of ibuprofen  with no relief.  She does get some relief with a heating pad.       Prior to Admission medications   Medication Sig Start Date End Date Taking? Authorizing Provider  acarbose  (PRECOSE ) 50 MG tablet Take 1 tablet (50 mg total) by mouth 3 (three) times daily. 10/20/22     Accu-Chek Softclix Lancets lancets Use as directed to test blood sugar 3 times daily 12/15/23     benzonatate  (TESSALON ) 100 MG capsule Take 1 capsule (100 mg total) by mouth 3 (three) times a day as needed for cough. 09/03/23     Blood Glucose Monitoring Suppl (ACCU-CHEK GUIDE) w/Device KIT Use as directed 3 times daily. 11/28/23     busPIRone  (BUSPAR ) 15 MG tablet Take 1 tablet (15 mg total) by mouth 3 (three) times daily with meals. 10/13/22     busPIRone  (BUSPAR ) 15 MG tablet Take 1 tablet (15 mg total) by mouth 3 (three) times daily with food. 07/22/23     busPIRone  (BUSPAR ) 15 MG tablet Take 1 tablet (15 mg total) by mouth 3 (three) times daily with food. 12/31/23     cariprazine  (VRAYLAR ) 3 MG capsule Take 1 capsule (3 mg total) by mouth at bedtime. 06/19/23     cariprazine  (VRAYLAR ) 3 MG capsule Take 1 capsule (3 mg total) by mouth at bedtime. 07/22/23     cariprazine  (VRAYLAR ) 3 MG capsule Take 1 capsule (3 mg total) by mouth at bedtime. 12/31/23     Continuous Glucose Sensor  (DEXCOM G7 SENSOR) MISC Apply one sensor every 10 days 07/14/23   Jodie Lavern CROME, MD  Continuous Glucose Sensor (DEXCOM G7 SENSOR) MISC Use as directed every 10 days. 11/04/23     cyanocobalamin  (VITAMIN B12) 1000 MCG/ML injection Inject 1 mL (1,000 mcg total) into the muscle every 30 (thirty) days. 02/02/23   Webb, Padonda B, FNP  cyclobenzaprine  (FLEXERIL ) 10 MG tablet Take 0.5-1 tablets (5-10 mg total) by mouth 3 (three) times daily as needed. 08/11/23   Burnette, Jennifer M, PA-C  Eszopiclone  3 MG TABS Take 1 tablet (3 mg total) by mouth at bedtime as needed for insomnia. Do not eat within 30 minutes of taking. 07/22/23     Eszopiclone  3 MG TABS Take 1 tablet (3 mg total) by mouth at bedtime as needed for insomnia. Do not eat within 30 minutes of taking. 10/08/23     Eszopiclone  3 MG TABS Take 1 tablet (3 mg total) by mouth at bedtime as needed for insomnia. Do not eat within 30 minutes of taking. 12/31/23     Glucagon  (BAQSIMI  TWO PACK) 3 MG/DOSE POWD Use as directed as needed. 10/20/22     glucose blood (ACCU-CHEK GUIDE TEST) test strip Use 1 strip as directed 2-3 times daily. 11/28/23  glucose blood (ONETOUCH VERIO) test strip Use as instructed 10/19/23   Jodie Lavern CROME, MD  HYDROcodone  bit-homatropine [facility]) 5-1.5 MG/5ML syrup Take 5 mLs by mouth every 8 (eight) hours as needed for cough. 09/04/23   Jodie Lavern CROME, MD  hydrOXYzine  (VISTARIL ) 50 MG capsule Take 1 capsule (50 mg total) by mouth every 6 (six) hours as needed. 10/13/22     hydrOXYzine  (VISTARIL ) 50 MG capsule Take 1 capsule (50 mg total) by mouth every 6 (six) hours as needed. 07/22/23     hydrOXYzine  (VISTARIL ) 50 MG capsule Take 1 capsule (50 mg total) by mouth every 6 (six) hours as needed. 12/31/23     lamoTRIgine  (LAMICTAL ) 150 MG tablet Take 2 tablets (300 mg total) by mouth daily. If out of lamictal  more than 1 week do not restart, call [phone] 07/22/23     lamoTRIgine  (LAMICTAL ) 150 MG tablet Take 2 tablets (300 mg total) by  mouth daily. If out of this medication for more than 1 week do not restart and call [phone]. 12/31/23     lithium  carbonate (LITHOBID ) 300 MG ER tablet Take 2 tablets (600 mg total) by mouth at bedtime. 01/21/23     lithium  carbonate (LITHOBID ) 300 MG ER tablet Take 2 tablets (600 mg total) by mouth at bedtime. 07/22/23     lithium  carbonate (LITHOBID ) 300 MG ER tablet Take 2 tablets (600 mg total) by mouth at bedtime. 12/31/23     metFORMIN  (GLUCOPHAGE -XR) 500 MG 24 hr tablet Take 1 tablet (500 mg total) by mouth daily with supper. 02/06/24     ondansetron  (ZOFRAN -ODT) 4 MG disintegrating tablet Take 1 tablet (4 mg total) by mouth every 8 (eight) hours as needed. 12/20/23     phentermine  (ADIPEX-P ) 37.5 MG tablet Take 0.5-1 tablets (18.75-37.5 mg total) by mouth daily before breakfast. 02/28/24   Webb, Padonda B, FNP  valACYclovir  (VALTREX ) 1000 MG tablet Take 2 tablets (2,000 mg total) by mouth 2 (two) times daily. Once, as needed for cold sores 01/14/22   Jodie Lavern CROME, MD  zolpidem  (AMBIEN ) 10 MG tablet Take 1 tablet (10 mg total) by mouth at bedtime.  (do not take within 30 minutes of eating) 10/13/22 12/30/22      Allergies: Nsaids, Other, Codeine , and Keflex  [cephalexin ]    Review of Systems  All other systems reviewed and are negative.   Updated Vital Signs BP (!) 160/107 (BP Location: Right Arm)   Pulse 93   Temp 98.2 F (36.8 C)   Resp 16   Ht 5' 3 (1.6 m)   Wt 102.5 kg   SpO2 100%   BMI 40.03 kg/m   Physical Exam Vitals and nursing note reviewed.  Constitutional:      General: She is not in acute distress.    Appearance: She is well-developed. She is not diaphoretic.  HENT:     Head: Normocephalic and atraumatic.  Cardiovascular:     Rate and Rhythm: Normal rate and regular rhythm.     Heart sounds: No murmur heard.    No friction rub. No gallop.  Pulmonary:     Effort: Pulmonary effort is normal. No respiratory distress.     Breath sounds: Normal breath sounds. No  wheezing.  Abdominal:     General: Bowel sounds are normal. There is no distension.     Palpations: Abdomen is soft.     Tenderness: There is no abdominal tenderness. There is left CVA tenderness. There is no right CVA tenderness, guarding or  rebound.     Comments: There is some tenderness in the soft tissues of the left flank.  Musculoskeletal:        General: Normal range of motion.     Cervical back: Normal range of motion and neck supple.  Skin:    General: Skin is warm and dry.  Neurological:     General: No focal deficit present.     Mental Status: She is alert and oriented to person, place, and time.     (all labs ordered are listed, but only abnormal results are displayed) Labs Reviewed  URINALYSIS, ROUTINE W REFLEX MICROSCOPIC - Abnormal; Notable for the following components:      Result Value   Leukocytes,Ua TRACE (*)    All other components within normal limits  CBC - Abnormal; Notable for the following components:   WBC 10.7 (*)    All other components within normal limits  COMPREHENSIVE METABOLIC PANEL WITH GFR - Abnormal; Notable for the following components:   Alkaline Phosphatase 150 (*)    All other components within normal limits  URINALYSIS, MICROSCOPIC (REFLEX) - Abnormal; Notable for the following components:   Bacteria, UA FEW (*)    All other components within normal limits  PREGNANCY, URINE    EKG: None  Radiology: CT Renal Stone Study Result Date: 03/02/2024 CLINICAL DATA:  Left flank pain for several days, initial EN EXAM: CT ABDOMEN AND PELVIS WITHOUT CONTRAST TECHNIQUE: Multidetector CT imaging of the abdomen and pelvis was performed following the standard protocol without IV contrast. RADIATION DOSE REDUCTION: This exam was performed according to the departmental dose-optimization program which includes automated exposure control, adjustment of the mA and/or kV according to patient size and/or use of iterative reconstruction technique. COMPARISON:   01/21/2022 FINDINGS: Lower chest: No acute abnormality. Hepatobiliary: No focal liver abnormality is seen. Status post cholecystectomy. No biliary dilatation. Pancreas: Unremarkable. No pancreatic ductal dilatation or surrounding inflammatory changes. Spleen: Normal in size without focal abnormality. Adrenals/Urinary Tract: Adrenal glands are within normal limits. Kidneys are well visualized without renal calculi or obstructive changes. The ureters are within normal limits. The bladder is decompressed. Stomach/Bowel: No obstructive or inflammatory changes of the colon are noted. The appendix is within normal limits. Small bowel is within normal limits. 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 Vascular/Lymphatic: No significant vascular findings are present. No enlarged abdominal or pelvic lymph nodes. Reproductive: Uterus and bilateral adnexa are unremarkable. Other: No abdominal wall hernia or abnormality. No abdominopelvic ascites. Musculoskeletal: No acute or significant osseous findings. IMPRESSION: No acute abnormality noted. Electronically Signed   By: Oneil Devonshire M.D.   On: 03/02/2024 22:57     Procedures   Medications Ordered in the ED  HYDROcodone -acetaminophen  (NORCO/VICODIN) 5-325 MG per tablet 2 tablet (has no administration in time range)                                    Medical Decision Making Amount and/or Complexity of Data Reviewed Labs: ordered.  Risk Prescription drug management.   Patient presenting with left flank pain as described in the HPI.  Patient arrives here with stable vital signs and is afebrile.  Physical examination reveals tenderness in the soft tissues of the left flank, but is otherwise unremarkable.  Laboratory studies obtained including CBC, metabolic panel, and urinalysis, all of which are basically unremarkable.  CT scan with renal protocol shows no  acute process.  There is no evidence for kidney stone or  obstructive uropathy.  Patient given hydrocodone  for pain for what I believed to be musculoskeletal discomfort.  There is nothing on the CAT scan that would explain her symptoms and feel she can safely be discharged.     Final diagnoses:  None    ED Discharge Orders     None          Geroldine Berg, MD 03/03/24 [phone]

## 2024-03-03 NOTE — Discharge Instructions (Addendum)
Begin taking hydrocodone as prescribed as needed for pain.  Rest.  Follow-up with primary doctor if not improving in the next week. 

## 2024-03-24 ENCOUNTER — Other Ambulatory Visit (HOSPITAL_BASED_OUTPATIENT_CLINIC_OR_DEPARTMENT_OTHER): Payer: Self-pay

## 2024-03-24 MED ORDER — ESZOPICLONE 3 MG PO TABS
3.0000 mg | ORAL_TABLET | Freq: Every evening | ORAL | 2 refills | Status: AC | PRN
  Filled 2024-03-24: qty 30, 30d supply, fill #0
  Filled 2024-04-28: qty 15, 15d supply, fill #0
  Filled 2024-05-05 – 2024-05-10 (×3): qty 15, 15d supply, fill #1
  Filled 2024-05-23: qty 30, 30d supply, fill #2
  Filled 2024-06-17 – 2024-06-20 (×2): qty 30, 30d supply, fill #3

## 2024-03-24 MED ORDER — QUETIAPINE FUMARATE 25 MG PO TABS
25.0000 mg | ORAL_TABLET | Freq: Every day | ORAL | 2 refills | Status: AC
  Filled 2024-03-24 (×2): qty 60, 30d supply, fill #0
  Filled 2024-04-18: qty 60, 30d supply, fill #1
  Filled 2024-05-08: qty 60, 30d supply, fill #2

## 2024-03-24 MED ORDER — VRAYLAR 3 MG PO CAPS
3.0000 mg | ORAL_CAPSULE | Freq: Every day | ORAL | 0 refills | Status: DC
  Filled 2024-03-24 – 2024-03-26 (×2): qty 90, 90d supply, fill #0

## 2024-03-26 ENCOUNTER — Other Ambulatory Visit (HOSPITAL_BASED_OUTPATIENT_CLINIC_OR_DEPARTMENT_OTHER): Payer: Self-pay

## 2024-03-29 ENCOUNTER — Telehealth: Admitting: Family Medicine

## 2024-03-29 ENCOUNTER — Other Ambulatory Visit (HOSPITAL_BASED_OUTPATIENT_CLINIC_OR_DEPARTMENT_OTHER): Payer: Self-pay

## 2024-03-29 DIAGNOSIS — M545 Low back pain, unspecified: Secondary | ICD-10-CM

## 2024-03-29 MED ORDER — CYCLOBENZAPRINE HCL 10 MG PO TABS
10.0000 mg | ORAL_TABLET | Freq: Three times a day (TID) | ORAL | 0 refills | Status: DC | PRN
  Filled 2024-03-29: qty 30, 10d supply, fill #0

## 2024-03-29 NOTE — Progress Notes (Signed)
 We are sorry that you are not feeling well.  Here is how we plan to help!  Based on what you have shared with me it looks like you mostly have acute back pain.  Acute back pain is defined as musculoskeletal pain that can resolve in 1-3 weeks with conservative treatment.  I have prescribed Flexeril  10 mg every eight hours as needed which is a muscle relaxer  Some patients experience stomach irritation or in increased heartburn with anti-inflammatory drugs.  Please keep in mind that muscle relaxer's can cause fatigue and should not be taken while at work or driving.  Back pain is very common.  The pain often gets better over time.  The cause of back pain is usually not dangerous.  Most people can learn to manage their back pain on their own.  Home Care Stay active.  Start with short walks on flat ground if you can.  Try to walk farther each day. Do not sit, drive or stand in one place for more than 30 minutes.  Do not stay in bed. Do not avoid exercise or work.  Activity can help your back heal faster. Be careful when you bend or lift an object.  Bend at your knees, keep the object close to you, and do not twist. Sleep on a firm mattress.  Lie on your side, and bend your knees.  If you lie on your back, put a pillow under your knees. Only take medicines as told by your doctor. Put ice on the injured area. Put ice in a plastic bag Place a towel between your skin and the bag Leave the ice on for 15-20 minutes, 3-4 times a day for the first 2-3 days. 210 After that, you can switch between ice and heat packs. Ask your doctor about back exercises or massage. Avoid feeling anxious or stressed.  Find good ways to deal with stress, such as exercise.  Get Help Right Way If: Your pain does not go away with rest or medicine. Your pain does not go away in 1 week. You have new problems. You do not feel well. The pain spreads into your legs. You cannot control when you poop (bowel movement) or pee  (urinate) You feel sick to your stomach (nauseous) or throw up (vomit) You have belly (abdominal) pain. You feel like you may pass out (faint). If you develop a fever.  Make Sure you: Understand these instructions. Will watch your condition Will get help right away if you are not doing well or get worse.  Your e-visit answers were reviewed by a board certified advanced clinical practitioner to complete your personal care plan.  Depending on the condition, your plan could have included both over the counter or prescription medications.  If there is a problem please reply  once you have received a response from your provider.  Your safety is important to us .  If you have drug allergies check your prescription carefully.    You can use MyChart to ask questions about today's visit, request a non-urgent call back, or ask for a work or school excuse for 24 hours related to this e-Visit. If it has been greater than 24 hours you will need to follow up with your provider, or enter a new e-Visit to address those concerns.  You will get an e-mail in the next two days asking about your experience.  I hope that your e-visit has been valuable and will speed your recovery. Thank you for using e-visits.  I have spent 5 minutes in review of e-visit questionnaire, review and updating patient chart, medical decision making and response to patient.   [name] CHRISTELLA Barefoot, NP

## 2024-03-30 ENCOUNTER — Other Ambulatory Visit (HOSPITAL_BASED_OUTPATIENT_CLINIC_OR_DEPARTMENT_OTHER): Payer: Self-pay

## 2024-03-31 ENCOUNTER — Encounter (HOSPITAL_BASED_OUTPATIENT_CLINIC_OR_DEPARTMENT_OTHER): Payer: Self-pay

## 2024-03-31 ENCOUNTER — Other Ambulatory Visit (HOSPITAL_BASED_OUTPATIENT_CLINIC_OR_DEPARTMENT_OTHER): Payer: Self-pay

## 2024-04-01 ENCOUNTER — Other Ambulatory Visit (HOSPITAL_BASED_OUTPATIENT_CLINIC_OR_DEPARTMENT_OTHER): Payer: Self-pay

## 2024-04-01 MED ORDER — COMIRNATY 30 MCG/0.3ML IM SUSY
0.3000 mL | PREFILLED_SYRINGE | Freq: Once | INTRAMUSCULAR | 0 refills | Status: AC
  Filled 2024-04-01: qty 0.3, 1d supply, fill #0

## 2024-04-01 MED ORDER — FLUZONE 0.5 ML IM SUSY
0.5000 mL | PREFILLED_SYRINGE | Freq: Once | INTRAMUSCULAR | 0 refills | Status: AC
  Filled 2024-04-01: qty 0.5, 1d supply, fill #0

## 2024-04-10 ENCOUNTER — Other Ambulatory Visit (HOSPITAL_BASED_OUTPATIENT_CLINIC_OR_DEPARTMENT_OTHER): Payer: Self-pay

## 2024-04-18 ENCOUNTER — Other Ambulatory Visit

## 2024-04-18 DIAGNOSIS — Z006 Encounter for examination for normal comparison and control in clinical research program: Secondary | ICD-10-CM

## 2024-04-20 ENCOUNTER — Other Ambulatory Visit (HOSPITAL_BASED_OUTPATIENT_CLINIC_OR_DEPARTMENT_OTHER): Payer: Self-pay

## 2024-04-20 ENCOUNTER — Other Ambulatory Visit: Payer: Self-pay

## 2024-04-26 ENCOUNTER — Encounter: Payer: Self-pay | Admitting: Family Medicine

## 2024-04-26 ENCOUNTER — Telehealth (INDEPENDENT_AMBULATORY_CARE_PROVIDER_SITE_OTHER): Admitting: Family Medicine

## 2024-04-26 ENCOUNTER — Other Ambulatory Visit (HOSPITAL_BASED_OUTPATIENT_CLINIC_OR_DEPARTMENT_OTHER): Payer: Self-pay

## 2024-04-26 ENCOUNTER — Ambulatory Visit: Admitting: Family Medicine

## 2024-04-26 VITALS — Ht 63.0 in | Wt 229.0 lb

## 2024-04-26 DIAGNOSIS — M5416 Radiculopathy, lumbar region: Secondary | ICD-10-CM | POA: Diagnosis not present

## 2024-04-26 DIAGNOSIS — M797 Fibromyalgia: Secondary | ICD-10-CM | POA: Diagnosis not present

## 2024-04-26 LAB — GENECONNECT MOLECULAR SCREEN: Genetic Analysis Overall Interpretation: NEGATIVE

## 2024-04-26 MED ORDER — PREDNISONE 10 MG PO TABS
ORAL_TABLET | ORAL | 0 refills | Status: DC
  Filled 2024-04-26: qty 21, 9d supply, fill #0

## 2024-04-26 MED ORDER — GABAPENTIN 300 MG PO CAPS
300.0000 mg | ORAL_CAPSULE | Freq: Every day | ORAL | 1 refills | Status: AC
  Filled 2024-04-26: qty 30, 30d supply, fill #0
  Filled 2024-05-23: qty 30, 30d supply, fill #1

## 2024-04-26 NOTE — Progress Notes (Signed)
 Virtual Visit via Video Note  Subjective  CC:  Chief Complaint  Patient presents with   Back Pain    Ongoing back issues and it is starting to get worse. Pain level 5/6     I connected with [name] [name] on 04/26/24 at  8:00 AM EST by a video enabled telemedicine application and verified that I am speaking with the correct person using two identifiers. Location patient: Home Location provider: [facility] [address] Office Persons participating in the virtual visit: [name] [name], [name] LITTIE Heck, MD Avelina Berber CMA  I discussed the limitations of evaluation and management by telemedicine and the availability of in person appointments. The patient expressed understanding and agreed to proceed. HPI: [name] [name] is a 38 y.o. female who was contacted today to address the problems listed above in the chief complaint. Discussed the use of AI scribe software for clinical note transcription with the patient, who gave verbal consent to proceed.  History of Present Illness [name] [name] is a 38 year old female who presents with progressively worsening low back pain radiating to the left leg.  Lumbosacral pain and radiculopathy - Progressively worsening low back pain over the past two months - Initial pain localized to the left low back, now involving the entire back - Radiation of pain down the left leg to the ankle for the past week and a half - Associated tingling sensations in the left leg - No lower extremity weakness - No bowel or bladder dysfunction  Sleep disturbance - Difficulty sleeping due to pain  Prior imaging and history - History of similar pain episodes - Previous MRI findings I reviewed lumbar MRI and thoracic MRI from 2021 and 2022.  Mostly normal with minimal DJD findings.   Pain management and response to treatment - She was evaluated in urgent care through an e-visit about a week ago.  Prescribed Flexeril ,  and using heating pads, cool compresses, stretching, increased walking, new shoes, and new insoles - Muscle relaxer (Flexeril ) has not been effective - Use of Tylenol  and ibuprofen  for pain relief  Fibromyalgia: She does have fibromyalgia which has been fairly well-controlled as of late.  She does not think that this is all related to current myalgia although she does complain of full back pain diffusely.  Fibromyalgia could be a component.  Assessment  1. Left lumbar radiculopathy      Plan  Assessment and Plan Assessment & Plan Lumbar radiculopathy with left-sided low back pain and leg tingling Chronic left-sided low back pain with recent onset of leg tingling over the past week and a half. Previous MRI showed no significant disc herniation but some bone chips. Current symptoms suggest nerve involvement, possibly due to inflammation or compression. No weakness, bowel, or bladder issues reported. - Started prednisone  for one week to reduce inflammation. - Initiated gabapentin  at night to manage nerve pain and aid sleep. - Advised use of Tylenol  for pain management while on prednisone . - Instructed to eat when taking prednisone  to prevent stomach upset.  Muscle spasm of back Muscle spasms likely contributing to back pain. Previous treatments with Flexeril , heating pads, and stretching have been ineffective. Muscle relaxers are not expected to be highly effective but may provide some relief. - Continue using heat for muscle spasm relief. - Consider muscle relaxers if needed, though not expected to be highly effective. - Encouraged gentle stretching and strengthening exercises once symptoms improve.  Fibromyalgia: Will  monitor pain relief with the above treatment.  Follow-up if not improving.   I discussed the assessment and treatment plan with the patient. The patient was provided an opportunity to ask questions and all were answered. The patient agreed with the plan and demonstrated an  understanding of the instructions.   The patient was advised to call back or seek an in-person evaluation if the symptoms worsen or if the condition fails to improve as anticipated. Follow up: December 2025 for complete physical. Visit date not found  Meds ordered this encounter  Medications   predniSONE  (DELTASONE ) 10 MG tablet    Sig: Take 4 tabs qd x 2 days, 3 qd x 2 days, 2 qd x 2d, 1qd x 3 days    Dispense:  21 tablet    Refill:  0   gabapentin  (NEURONTIN ) 300 MG capsule    Sig: Take 1 capsule (300 mg total) by mouth at bedtime.    Dispense:  30 capsule    Refill:  1      I reviewed the patients updated PMH, FH, and SocHx.    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    Priority: High   Vitamin B12 deficiency 06/01/2019    Priority: Medium    Pain in both lower extremities 11/21/2018    Priority: Medium    Nocturnal leg cramps 10/26/2018    Priority: Medium    Nephrolithiasis     Priority: Medium    Vitamin D  deficiency 06/01/2019    Priority: Low   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   cariprazine  (VRAYLAR ) 3 MG capsule Take 1 capsule (3 mg total) by mouth at bedtime.   cyclobenzaprine  (FLEXERIL ) 10 MG tablet Take 1 tablet (10 mg total) by mouth 3 (three) times daily as needed for muscle spasms.   Eszopiclone  3 MG TABS Take 1 tablet (3 mg total) by mouth at bedtime as needed. Do not eat within 30 minutes of taking.   gabapentin  (NEURONTIN ) 300 MG capsule Take 1 capsule (300 mg total) by mouth at bedtime.   glucose blood (ACCU-CHEK GUIDE TEST) test strip Use 1 strip as directed 2-3 times daily.   hydrOXYzine  (VISTARIL ) 50 MG capsule Take 1 capsule (50 mg total) by mouth every 6 (six) hours as needed.   lamoTRIgine  (LAMICTAL ) 150 MG tablet Take 2 tablets (300 mg total) by mouth daily. If out  of this medication for more than 1 week do not restart and call [phone].   lithium  carbonate (LITHOBID ) 300 MG ER tablet Take 2 tablets (600 mg total) by mouth at bedtime.   metFORMIN  (GLUCOPHAGE -XR) 500 MG 24 hr tablet Take 1 tablet (500 mg total) by mouth daily with supper.   phentermine  (ADIPEX-P ) 37.5 MG tablet Take 0.5-1 tablets (18.75-37.5 mg total) by mouth daily before breakfast.   predniSONE  (DELTASONE ) 10 MG tablet Take 4 tabs qd x 2 days, 3 qd x 2 days, 2 qd x 2d, 1qd x 3 days   QUEtiapine  (SEROQUEL ) 25 MG tablet Take 1-2 tablets (25-50 mg total) by mouth at bedtime.   [DISCONTINUED] cariprazine  (VRAYLAR ) 3 MG capsule Take 1 capsule (3 mg total) by mouth at bedtime.   [DISCONTINUED] Eszopiclone  3 MG TABS Take 1 tablet (3 mg total) by mouth at bedtime as needed for insomnia. Do not eat within 30 minutes of taking.   [DISCONTINUED] hydrOXYzine  (VISTARIL ) 50 MG capsule Take 1 capsule (50 mg total) by mouth every 6 (six) hours as needed.   [DISCONTINUED] lithium  carbonate (LITHOBID ) 300 MG ER tablet Take 2 tablets (600 mg total) by mouth at bedtime.     Allergies: Patient is allergic to nsaids, other, codeine , and keflex  [cephalexin ]. Family History: Patient family history includes ADD / ADHD in her daughter and daughter; Anxiety disorder in her father and mother; Arthritis in her father, maternal grandfather, maternal grandmother, and mother; Bipolar disorder in her father; Cancer in her father; Colon cancer (age of onset: 55) in her father; Depression in her father, maternal grandmother, mother, and paternal grandmother; Diabetes in her maternal grandfather; Early death in her father and maternal grandfather; Hearing loss in her father; Hyperlipidemia in her father; Hypertension in her father, maternal grandfather, maternal grandmother, and mother; Kidney disease in her father; Obesity in her father, maternal aunt, maternal grandfather, maternal grandmother, maternal uncle, and mother; Sleep apnea in her father; Stroke in her maternal grandfather; Uterine cancer (age of onset: 49) in her maternal grandmother; Varicose Veins in her maternal grandmother; Vision loss in her maternal grandfather and maternal grandmother. Social History:  Patient  reports that she has never smoked. She has never been exposed to tobacco smoke. She has never used smokeless tobacco. She reports that she does not currently use alcohol. She reports that she does not use drugs.  Review of Systems: Constitutional: Negative for fever malaise or anorexia Cardiovascular: negative for chest pain Respiratory: negative for SOB or persistent cough Gastrointestinal: negative for abdominal pain  OBJECTIVE Vitals: Ht 5' 3 (1.6 m)   Wt 229 lb (103.9 kg)   BMI 40.57 kg/m  General: no acute distress , A&Ox3, sitting upright in chair.  [name] LITTIE Heck, MD

## 2024-04-28 ENCOUNTER — Other Ambulatory Visit (HOSPITAL_BASED_OUTPATIENT_CLINIC_OR_DEPARTMENT_OTHER): Payer: Self-pay

## 2024-04-28 ENCOUNTER — Other Ambulatory Visit: Payer: Self-pay

## 2024-05-04 ENCOUNTER — Encounter (HOSPITAL_BASED_OUTPATIENT_CLINIC_OR_DEPARTMENT_OTHER): Payer: Self-pay

## 2024-05-05 ENCOUNTER — Other Ambulatory Visit (HOSPITAL_BASED_OUTPATIENT_CLINIC_OR_DEPARTMENT_OTHER): Payer: Self-pay

## 2024-05-09 ENCOUNTER — Other Ambulatory Visit: Payer: Self-pay

## 2024-05-09 ENCOUNTER — Other Ambulatory Visit (HOSPITAL_BASED_OUTPATIENT_CLINIC_OR_DEPARTMENT_OTHER): Payer: Self-pay

## 2024-05-10 ENCOUNTER — Other Ambulatory Visit (HOSPITAL_BASED_OUTPATIENT_CLINIC_OR_DEPARTMENT_OTHER): Payer: Self-pay

## 2024-05-16 ENCOUNTER — Encounter: Payer: Self-pay | Admitting: Family Medicine

## 2024-05-16 ENCOUNTER — Ambulatory Visit: Payer: Self-pay

## 2024-05-16 ENCOUNTER — Other Ambulatory Visit (HOSPITAL_BASED_OUTPATIENT_CLINIC_OR_DEPARTMENT_OTHER): Payer: Self-pay

## 2024-05-16 ENCOUNTER — Ambulatory Visit: Admitting: Family Medicine

## 2024-05-16 VITALS — BP 132/80 | HR 79 | Temp 98.0°F | Resp 18 | Ht 63.0 in | Wt 236.0 lb

## 2024-05-16 DIAGNOSIS — J0100 Acute maxillary sinusitis, unspecified: Secondary | ICD-10-CM | POA: Diagnosis not present

## 2024-05-16 MED ORDER — HYDROCOD POLI-CHLORPHE POLI ER 10-8 MG/5ML PO SUER: 5.0000 mL | Freq: Two times a day (BID) | ORAL | 0 refills | Status: DC | PRN

## 2024-05-16 MED ORDER — HYDROCOD POLI-CHLORPHE POLI ER 10-8 MG/5ML PO SUER
5.0000 mL | Freq: Two times a day (BID) | ORAL | 0 refills | Status: AC | PRN
  Filled 2024-05-16: qty 120, 12d supply, fill #0

## 2024-05-16 MED ORDER — AMOXICILLIN-POT CLAVULANATE 875-125 MG PO TABS
1.0000 | ORAL_TABLET | Freq: Two times a day (BID) | ORAL | 0 refills | Status: AC
  Filled 2024-05-16: qty 14, 7d supply, fill #0

## 2024-05-16 NOTE — Telephone Encounter (Signed)
 No openings at PCP and [name] [name] wanted to be seen today Scheduled at [facility] Only or Action Required?: FYI only for provider: appointment scheduled on 05/16/2024 at 10:00am at [name] with Niki Rung .  [name] [name] was last seen in primary care on 04/26/2024 by Jodie Lavern CROME, MD.  Called Nurse Triage reporting Cough.  Symptoms began a week ago.  Interventions attempted: OTC medications: tylenol , nyquil, sudafed,, Prescription medications: tessalon  pearls, and Rest, hydration, or home remedies.  Symptoms are: gradually worsening.  Triage Disposition: See Physician Within 24 Hours  [name] [name]/caregiver understands and will follow disposition?: Yes                 Copied from CRM #[id number]. Topic: Clinical - Red Word Triage >> May 16, 2024  8:04 AM Emylou G wrote: [facility] that prompted transfer to Nurse Triage: bad cough, stuffy / runny nose, headache.. since last tuesday.. barky cough - some mucus.. Reason for Disposition  [1] Continuous (nonstop) coughing interferes with work or school AND [2] no improvement using cough treatment per Care Advice  Answer Assessment - Initial Assessment Questions Since Tuesday Head cold then flu-like symptoms but no fever per [name] [name] Cough--productive--light green cloudy white and sometimes a tinge of blood, runny nose Denies fever, chest pain, difficulty breathing Cough has been keeping her up at night and she would like to be seen today if possibly. No openings at PCP so [name] [name] scheduled at [facility]  [name] [name] is advised to call us  back if anything changes or with any further questions/concerns. [name] [name] is advised that if anything worsens to go to the Emergency Room. [name] [name] verbalized understanding.  Protocols used: Cough - Acute Productive-A-AH

## 2024-05-16 NOTE — Progress Notes (Signed)
 Assessment & Plan Acute non-recurrent maxillary sinusitis - Education provided on sinusitis. - Advised caution with sedatives. - Provided work absence note until Wednesday.  Orders:   amoxicillin -clavulanate (AUGMENTIN ) 875-125 MG tablet; Take 1 tablet by mouth 2 (two) times daily for 7 days.   chlorpheniramine-HYDROcodone  (TUSSIONEX) 10-8 MG/5ML; Take 5 mLs by mouth every 12 (twelve) hours as needed for cough.   Follow up plan: Return if symptoms worsen or fail to improve.  Niki Rung, MSN, APRN, FNP-C  Subjective:  HPI: [name] [name] is a 38 y.o. female presenting on 05/16/2024 for Cough (Started Tuesday, congestion and mucus = cloudy green, often blood tinged, runny nose, 2 nose bleeds, very dry barking cough at night, chest is heavy. No fever / )  Discussed the use of AI scribe software for clinical note transcription with the patient, who gave verbal consent to proceed.  She has been experiencing a cough, congestion, runny nose, epistaxis, and a 'barking' cough at night, along with a sensation of chest heaviness. The congestion affects both her chest and nasal passages. No fever is present.  She has been self-medicating with benzonatate , Sudafed, Tylenol  during the day, and NyQuil at night, but reports that she 'just can't get over it.'  She has a history of similar episodes occurring twice a year, during which she typically receives Tussionex and sometimes an antibiotic.  She has no history of asthma and does not smoke.       ROS: Negative unless specifically indicated above in HPI.   Relevant past medical history reviewed and updated as indicated.   Allergies and medications reviewed and updated.   Current Outpatient Medications:    Accu-Chek Softclix Lancets lancets, Use as directed to test blood sugar 3 times daily, Disp: 300 each, Rfl: 5   amoxicillin -clavulanate (AUGMENTIN ) 875-125 MG tablet, Take 1 tablet by mouth 2 (two) times daily for 7 days., Disp:  14 tablet, Rfl: 0   busPIRone  (BUSPAR ) 15 MG tablet, Take 1 tablet (15 mg total) by mouth 3 (three) times daily with food., Disp: 270 tablet, Rfl: 1   cariprazine  (VRAYLAR ) 3 MG capsule, Take 1 capsule (3 mg total) by mouth at bedtime., Disp: 30 capsule, Rfl: 4   Eszopiclone  3 MG TABS, Take 1 tablet (3 mg total) by mouth at bedtime as needed. Do not eat within 30 minutes of taking., Disp: 30 tablet, Rfl: 2   gabapentin  (NEURONTIN ) 300 MG capsule, Take 1 capsule (300 mg total) by mouth at bedtime., Disp: 30 capsule, Rfl: 1   glucose blood (ACCU-CHEK GUIDE TEST) test strip, Use 1 strip as directed 2-3 times daily., Disp: 100 strip, Rfl: 11   hydrOXYzine  (VISTARIL ) 50 MG capsule, Take 1 capsule (50 mg total) by mouth every 6 (six) hours as needed., Disp: 270 capsule, Rfl: 1   lamoTRIgine  (LAMICTAL ) 150 MG tablet, Take 2 tablets (300 mg total) by mouth daily. If out of this medication for more than 1 week do not restart and call [phone]., Disp: 180 tablet, Rfl: 1   lithium  carbonate (LITHOBID ) 300 MG ER tablet, Take 2 tablets (600 mg total) by mouth at bedtime., Disp: 270 tablet, Rfl: 1   metFORMIN  (GLUCOPHAGE -XR) 500 MG 24 hr tablet, Take 1 tablet (500 mg total) by mouth daily with supper., Disp: 30 tablet, Rfl: 3   phentermine  (ADIPEX-P ) 37.5 MG tablet, Take 0.5-1 tablets (18.75-37.5 mg total) by mouth daily before breakfast., Disp: 30 tablet, Rfl: 2   QUEtiapine  (SEROQUEL ) 25 MG tablet, Take 1-2 tablets (25-50  mg total) by mouth at bedtime., Disp: 60 tablet, Rfl: 2   chlorpheniramine-HYDROcodone  (TUSSIONEX) 10-8 MG/5ML, Take 5 mLs by mouth every 12 (twelve) hours as needed for cough., Disp: 120 mL, Rfl: 0  Allergies  Allergen Reactions   Nsaids Other (See Comments)    Gastric bypass    Other Other (See Comments)    Ambien  sleep walking Other reaction(s): Other (See Comments)   Codeine  Diarrhea and Nausea And Vomiting   Keflex  [Cephalexin ] Other (See Comments)    Yeast infection     Objective:   BP 132/80   Pulse 79   Temp 98 F (36.7 C)   Resp 18   Ht 5' 3 (1.6 m)   Wt 236 lb (107 kg)   SpO2 97%   BMI 41.81 kg/m    Physical Exam Vitals reviewed.  Constitutional:      General: She is not in acute distress.    Appearance: Normal appearance. She is not ill-appearing, toxic-appearing or diaphoretic.  HENT:     Head: Normocephalic and atraumatic.     Right Ear: Tympanic membrane, ear canal and external ear normal. There is no impacted cerumen.     Left Ear: Tympanic membrane, ear canal and external ear normal. There is no impacted cerumen.     Nose: Congestion present. No rhinorrhea.     Right Sinus: Maxillary sinus tenderness present. No frontal sinus tenderness.     Left Sinus: Maxillary sinus tenderness present. No frontal sinus tenderness.     Mouth/Throat:     Mouth: Mucous membranes are moist.     Pharynx: Oropharynx is clear. No oropharyngeal exudate or posterior oropharyngeal erythema.  Eyes:     General: No scleral icterus.       Right eye: No discharge.        Left eye: No discharge.     Conjunctiva/sclera: Conjunctivae normal.  Cardiovascular:     Rate and Rhythm: Normal rate and regular rhythm.     Heart sounds: Normal heart sounds. No murmur heard.    No friction rub. No gallop.  Pulmonary:     Effort: Pulmonary effort is normal. No respiratory distress.     Breath sounds: Normal breath sounds. No stridor. No wheezing, rhonchi or rales.  Musculoskeletal:        General: Normal range of motion.     Cervical back: Normal range of motion.  Lymphadenopathy:     Cervical: No cervical adenopathy.  Skin:    General: Skin is warm and dry.     Capillary Refill: Capillary refill takes less than 2 seconds.  Neurological:     General: No focal deficit present.     Mental Status: She is alert and oriented to person, place, and time. Mental status is at baseline.  Psychiatric:        Mood and Affect: Mood normal.        Behavior: Behavior  normal.        Thought Content: Thought content normal.        Judgment: Judgment normal.

## 2024-05-18 ENCOUNTER — Ambulatory Visit: Admitting: Family Medicine

## 2024-05-19 ENCOUNTER — Other Ambulatory Visit (HOSPITAL_BASED_OUTPATIENT_CLINIC_OR_DEPARTMENT_OTHER): Payer: Self-pay

## 2024-05-19 ENCOUNTER — Telehealth: Admitting: Family Medicine

## 2024-05-19 DIAGNOSIS — R112 Nausea with vomiting, unspecified: Secondary | ICD-10-CM

## 2024-05-19 MED ORDER — ONDANSETRON HCL 4 MG PO TABS
4.0000 mg | ORAL_TABLET | Freq: Three times a day (TID) | ORAL | 0 refills | Status: DC | PRN
  Filled 2024-05-19: qty 18, 6d supply, fill #0

## 2024-05-19 NOTE — Progress Notes (Signed)

## 2024-05-23 ENCOUNTER — Other Ambulatory Visit: Payer: Self-pay

## 2024-06-02 ENCOUNTER — Other Ambulatory Visit: Payer: Self-pay

## 2024-06-02 ENCOUNTER — Other Ambulatory Visit (HOSPITAL_BASED_OUTPATIENT_CLINIC_OR_DEPARTMENT_OTHER): Payer: Self-pay

## 2024-06-02 MED ORDER — BUSPIRONE HCL 15 MG PO TABS
15.0000 mg | ORAL_TABLET | Freq: Three times a day (TID) | ORAL | 1 refills | Status: AC
  Filled 2024-06-02: qty 270, 90d supply, fill #0

## 2024-06-02 MED ORDER — LITHIUM CARBONATE ER 300 MG PO TBCR: 600.0000 mg | EXTENDED_RELEASE_TABLET | Freq: Every day | ORAL | 1 refills | Status: AC

## 2024-06-02 MED ORDER — ESZOPICLONE 3 MG PO TABS
3.0000 mg | ORAL_TABLET | Freq: Every evening | ORAL | 2 refills | Status: AC | PRN
  Filled 2024-07-15 – 2024-07-18 (×3): qty 30, 30d supply, fill #0

## 2024-06-02 MED ORDER — VRAYLAR 3 MG PO CAPS
3.0000 mg | ORAL_CAPSULE | Freq: Every day | ORAL | 0 refills | Status: AC
  Filled 2024-06-02: qty 90, 90d supply, fill #0

## 2024-06-02 MED ORDER — DULOXETINE HCL 30 MG PO CPEP
ORAL_CAPSULE | ORAL | 0 refills | Status: AC
  Filled 2024-06-02: qty 21, 14d supply, fill #0

## 2024-06-02 MED ORDER — LAMOTRIGINE 150 MG PO TABS: 300.0000 mg | ORAL_TABLET | Freq: Every day | ORAL | 1 refills | Status: AC

## 2024-06-02 MED ORDER — QUETIAPINE FUMARATE 100 MG PO TABS
100.0000 mg | ORAL_TABLET | Freq: Every day | ORAL | 1 refills | Status: DC
  Filled 2024-06-02: qty 30, 30d supply, fill #0
  Filled 2024-06-24: qty 30, 30d supply, fill #1

## 2024-06-14 ENCOUNTER — Other Ambulatory Visit (HOSPITAL_BASED_OUTPATIENT_CLINIC_OR_DEPARTMENT_OTHER): Payer: Self-pay

## 2024-06-14 MED ORDER — DULOXETINE HCL 60 MG PO CPEP
60.0000 mg | ORAL_CAPSULE | Freq: Every day | ORAL | 2 refills | Status: AC
  Filled 2024-06-14: qty 30, 30d supply, fill #0
  Filled 2024-07-10: qty 30, 30d supply, fill #1

## 2024-06-17 ENCOUNTER — Other Ambulatory Visit: Payer: Self-pay

## 2024-06-17 ENCOUNTER — Other Ambulatory Visit: Payer: Self-pay | Admitting: Family

## 2024-06-17 ENCOUNTER — Other Ambulatory Visit (HOSPITAL_BASED_OUTPATIENT_CLINIC_OR_DEPARTMENT_OTHER): Payer: Self-pay

## 2024-06-20 NOTE — Telephone Encounter (Signed)
 09/04/2023 LOV  02/28/2024 fill date  30/0 refills

## 2024-06-25 ENCOUNTER — Other Ambulatory Visit (HOSPITAL_BASED_OUTPATIENT_CLINIC_OR_DEPARTMENT_OTHER): Payer: Self-pay

## 2024-06-27 ENCOUNTER — Other Ambulatory Visit (HOSPITAL_BASED_OUTPATIENT_CLINIC_OR_DEPARTMENT_OTHER): Payer: Self-pay

## 2024-07-10 ENCOUNTER — Other Ambulatory Visit: Payer: Self-pay | Admitting: Family

## 2024-07-11 ENCOUNTER — Other Ambulatory Visit: Payer: Self-pay

## 2024-07-11 ENCOUNTER — Other Ambulatory Visit (HOSPITAL_BASED_OUTPATIENT_CLINIC_OR_DEPARTMENT_OTHER): Payer: Self-pay

## 2024-07-11 MED ORDER — PHENTERMINE HCL 37.5 MG PO TABS
18.2500 mg | ORAL_TABLET | Freq: Every day | ORAL | 2 refills | Status: AC
  Filled 2024-07-11: qty 30, 30d supply, fill #0

## 2024-07-15 ENCOUNTER — Other Ambulatory Visit (HOSPITAL_BASED_OUTPATIENT_CLINIC_OR_DEPARTMENT_OTHER): Payer: Self-pay

## 2024-07-18 ENCOUNTER — Other Ambulatory Visit: Payer: Self-pay | Admitting: Family Medicine

## 2024-07-18 ENCOUNTER — Other Ambulatory Visit (HOSPITAL_BASED_OUTPATIENT_CLINIC_OR_DEPARTMENT_OTHER): Payer: Self-pay

## 2024-07-18 MED ORDER — ONDANSETRON HCL 4 MG PO TABS
4.0000 mg | ORAL_TABLET | Freq: Three times a day (TID) | ORAL | 0 refills | Status: AC | PRN
  Filled 2024-07-18: qty 18, 20d supply, fill #0

## 2024-07-19 ENCOUNTER — Other Ambulatory Visit (HOSPITAL_BASED_OUTPATIENT_CLINIC_OR_DEPARTMENT_OTHER): Payer: Self-pay

## 2024-07-21 ENCOUNTER — Other Ambulatory Visit (HOSPITAL_BASED_OUTPATIENT_CLINIC_OR_DEPARTMENT_OTHER): Payer: Self-pay

## 2024-07-21 MED ORDER — QUETIAPINE FUMARATE 100 MG PO TABS
100.0000 mg | ORAL_TABLET | Freq: Every day | ORAL | 2 refills | Status: AC
  Filled 2024-07-21: qty 30, 30d supply, fill #0
